# Patient Record
Sex: Male | Born: 1955
Health system: Southern US, Community
[De-identification: ages and names within clinical notes are randomized; demographics above are authoritative.]

## PROBLEM LIST (undated history)

## (undated) DIAGNOSIS — I10 Essential (primary) hypertension: Secondary | ICD-10-CM

## (undated) DIAGNOSIS — Z9989 Dependence on other enabling machines and devices: Secondary | ICD-10-CM

## (undated) DIAGNOSIS — E785 Hyperlipidemia, unspecified: Secondary | ICD-10-CM

## (undated) DIAGNOSIS — C189 Malignant neoplasm of colon, unspecified: Secondary | ICD-10-CM

## (undated) DIAGNOSIS — R0600 Dyspnea, unspecified: Secondary | ICD-10-CM

## (undated) DIAGNOSIS — G4733 Obstructive sleep apnea (adult) (pediatric): Secondary | ICD-10-CM

## (undated) DIAGNOSIS — I509 Heart failure, unspecified: Secondary | ICD-10-CM

## (undated) DIAGNOSIS — Z9289 Personal history of other medical treatment: Secondary | ICD-10-CM

## (undated) DIAGNOSIS — J189 Pneumonia, unspecified organism: Secondary | ICD-10-CM

## (undated) DIAGNOSIS — D649 Anemia, unspecified: Secondary | ICD-10-CM

## (undated) HISTORY — PX: HIP SURGERY: SHX245

## (undated) HISTORY — DX: Heart failure, unspecified: I50.9

## (undated) HISTORY — DX: Hyperlipidemia, unspecified: E78.5

## (undated) HISTORY — DX: Malignant neoplasm of colon, unspecified: C18.9

## (undated) HISTORY — DX: Anemia, unspecified: D64.9

## (undated) HISTORY — PX: HERNIA REPAIR: SHX51

## (undated) HISTORY — DX: Personal history of other medical treatment: Z92.89

## (undated) HISTORY — DX: Obstructive sleep apnea (adult) (pediatric): G47.33

## (undated) HISTORY — DX: Morbid (severe) obesity due to excess calories: E66.01

## (undated) HISTORY — PX: TONSILLECTOMY: SUR1361

## (undated) HISTORY — PX: CORNEAL TRANSPLANT: SHX108

## (undated) HISTORY — DX: Dependence on other enabling machines and devices: Z99.89

## (undated) HISTORY — PX: COLONOSCOPY: SHX174

## (undated) HISTORY — DX: Essential (primary) hypertension: I10

---

## 2004-02-09 ENCOUNTER — Emergency Department (HOSPITAL_COMMUNITY): Admission: EM | Admit: 2004-02-09 | Discharge: 2004-02-09 | Payer: Self-pay | Admitting: Family Medicine

## 2004-06-13 ENCOUNTER — Emergency Department (HOSPITAL_COMMUNITY): Admission: EM | Admit: 2004-06-13 | Discharge: 2004-06-13 | Payer: Self-pay | Admitting: Emergency Medicine

## 2004-08-06 ENCOUNTER — Inpatient Hospital Stay (HOSPITAL_COMMUNITY): Admission: EM | Admit: 2004-08-06 | Discharge: 2004-08-17 | Payer: Self-pay | Admitting: Emergency Medicine

## 2004-08-23 ENCOUNTER — Ambulatory Visit: Payer: Self-pay | Admitting: Internal Medicine

## 2004-09-07 ENCOUNTER — Ambulatory Visit: Payer: Self-pay | Admitting: Internal Medicine

## 2004-09-09 ENCOUNTER — Ambulatory Visit: Payer: Self-pay

## 2004-09-23 ENCOUNTER — Ambulatory Visit: Payer: Self-pay | Admitting: Internal Medicine

## 2004-10-21 ENCOUNTER — Ambulatory Visit: Payer: Self-pay | Admitting: Internal Medicine

## 2004-10-25 ENCOUNTER — Ambulatory Visit: Payer: Self-pay | Admitting: Internal Medicine

## 2004-11-23 ENCOUNTER — Ambulatory Visit: Payer: Self-pay | Admitting: Internal Medicine

## 2004-11-25 ENCOUNTER — Ambulatory Visit: Payer: Self-pay | Admitting: Internal Medicine

## 2004-12-08 ENCOUNTER — Ambulatory Visit: Payer: Self-pay

## 2004-12-22 ENCOUNTER — Ambulatory Visit: Payer: Self-pay

## 2005-01-18 ENCOUNTER — Ambulatory Visit: Payer: Self-pay | Admitting: Internal Medicine

## 2005-01-19 ENCOUNTER — Ambulatory Visit: Payer: Self-pay | Admitting: Internal Medicine

## 2005-01-28 ENCOUNTER — Encounter (HOSPITAL_BASED_OUTPATIENT_CLINIC_OR_DEPARTMENT_OTHER): Admission: RE | Admit: 2005-01-28 | Discharge: 2005-04-28 | Payer: Self-pay | Admitting: Surgery

## 2005-02-23 ENCOUNTER — Ambulatory Visit: Payer: Self-pay | Admitting: Internal Medicine

## 2005-03-08 ENCOUNTER — Ambulatory Visit: Payer: Self-pay | Admitting: Internal Medicine

## 2005-04-25 ENCOUNTER — Ambulatory Visit: Payer: Self-pay | Admitting: Internal Medicine

## 2005-04-29 ENCOUNTER — Encounter (HOSPITAL_BASED_OUTPATIENT_CLINIC_OR_DEPARTMENT_OTHER): Admission: RE | Admit: 2005-04-29 | Discharge: 2005-07-28 | Payer: Self-pay | Admitting: Surgery

## 2005-07-25 ENCOUNTER — Ambulatory Visit: Payer: Self-pay | Admitting: Internal Medicine

## 2005-08-19 ENCOUNTER — Ambulatory Visit: Payer: Self-pay | Admitting: Internal Medicine

## 2005-08-23 ENCOUNTER — Ambulatory Visit: Payer: Self-pay | Admitting: Internal Medicine

## 2005-08-31 ENCOUNTER — Ambulatory Visit: Payer: Self-pay | Admitting: Internal Medicine

## 2005-08-31 ENCOUNTER — Ambulatory Visit (HOSPITAL_COMMUNITY): Admission: RE | Admit: 2005-08-31 | Discharge: 2005-09-01 | Payer: Self-pay | Admitting: Internal Medicine

## 2005-08-31 ENCOUNTER — Encounter (INDEPENDENT_AMBULATORY_CARE_PROVIDER_SITE_OTHER): Payer: Self-pay | Admitting: Specialist

## 2005-09-19 DIAGNOSIS — C189 Malignant neoplasm of colon, unspecified: Secondary | ICD-10-CM

## 2005-09-19 HISTORY — DX: Malignant neoplasm of colon, unspecified: C18.9

## 2005-10-04 ENCOUNTER — Encounter (INDEPENDENT_AMBULATORY_CARE_PROVIDER_SITE_OTHER): Payer: Self-pay | Admitting: Specialist

## 2005-10-04 ENCOUNTER — Inpatient Hospital Stay (HOSPITAL_COMMUNITY): Admission: RE | Admit: 2005-10-04 | Discharge: 2005-10-11 | Payer: Self-pay

## 2005-10-20 HISTORY — PX: OTHER SURGICAL HISTORY: SHX169

## 2005-11-04 ENCOUNTER — Encounter (HOSPITAL_BASED_OUTPATIENT_CLINIC_OR_DEPARTMENT_OTHER): Admission: RE | Admit: 2005-11-04 | Discharge: 2006-02-02 | Payer: Self-pay | Admitting: Surgery

## 2005-11-16 ENCOUNTER — Ambulatory Visit: Payer: Self-pay | Admitting: Hematology and Oncology

## 2005-12-02 ENCOUNTER — Ambulatory Visit (HOSPITAL_COMMUNITY): Admission: RE | Admit: 2005-12-02 | Discharge: 2005-12-02 | Payer: Self-pay | Admitting: Hematology and Oncology

## 2005-12-05 ENCOUNTER — Ambulatory Visit: Payer: Self-pay | Admitting: Internal Medicine

## 2005-12-12 ENCOUNTER — Ambulatory Visit: Payer: Self-pay | Admitting: Internal Medicine

## 2005-12-22 LAB — COMPREHENSIVE METABOLIC PANEL
ALT: 14 U/L (ref 0–40)
AST: 22 U/L (ref 0–37)
Albumin: 3.5 g/dL (ref 3.5–5.2)
Alkaline Phosphatase: 155 U/L — ABNORMAL HIGH (ref 39–117)
BUN: 16 mg/dL (ref 6–23)
Calcium: 9.1 mg/dL (ref 8.4–10.5)
Creatinine, Ser: 0.9 mg/dL (ref 0.4–1.5)
Total Bilirubin: 0.4 mg/dL (ref 0.3–1.2)

## 2005-12-22 LAB — CBC WITH DIFFERENTIAL/PLATELET
Basophils Absolute: 0 10*3/uL (ref 0.0–0.1)
EOS%: 4.4 % (ref 0.0–7.0)
HCT: 30.6 % — ABNORMAL LOW (ref 38.7–49.9)
HGB: 9.7 g/dL — ABNORMAL LOW (ref 13.0–17.1)
MCH: 26 pg — ABNORMAL LOW (ref 28.0–33.4)
MCV: 81.5 fL — ABNORMAL LOW (ref 81.6–98.0)
MONO%: 7.9 % (ref 0.0–13.0)
NEUT%: 68.1 % (ref 40.0–75.0)

## 2005-12-27 ENCOUNTER — Ambulatory Visit: Payer: Self-pay | Admitting: Internal Medicine

## 2005-12-30 LAB — BASIC METABOLIC PANEL
CO2: 26 mEq/L (ref 19–32)
Glucose, Bld: 86 mg/dL (ref 70–99)
Potassium: 4.1 mEq/L (ref 3.5–5.3)
Sodium: 140 mEq/L (ref 135–145)

## 2005-12-30 LAB — CBC WITH DIFFERENTIAL/PLATELET
BASO%: 0.3 % (ref 0.0–2.0)
Eosinophils Absolute: 0.2 10*3/uL (ref 0.0–0.5)
MONO#: 0.5 10*3/uL (ref 0.1–0.9)
NEUT#: 4.6 10*3/uL (ref 1.5–6.5)
RBC: 3.63 10*6/uL — ABNORMAL LOW (ref 4.20–5.71)
RDW: 16.6 % — ABNORMAL HIGH (ref 11.2–14.6)
WBC: 6.3 10*3/uL (ref 4.0–10.0)

## 2005-12-30 LAB — MAGNESIUM: Magnesium: 1.6 mg/dL (ref 1.5–2.5)

## 2006-01-09 ENCOUNTER — Ambulatory Visit (HOSPITAL_COMMUNITY): Admission: RE | Admit: 2006-01-09 | Discharge: 2006-01-09 | Payer: Self-pay

## 2006-01-10 ENCOUNTER — Ambulatory Visit: Payer: Self-pay | Admitting: Hematology and Oncology

## 2006-01-13 LAB — CBC WITH DIFFERENTIAL/PLATELET
Basophils Absolute: 0 10*3/uL (ref 0.0–0.1)
EOS%: 5.6 % (ref 0.0–7.0)
Eosinophils Absolute: 0.3 10*3/uL (ref 0.0–0.5)
HCT: 33.5 % — ABNORMAL LOW (ref 38.7–49.9)
HGB: 10.3 g/dL — ABNORMAL LOW (ref 13.0–17.1)
MCH: 25.9 pg — ABNORMAL LOW (ref 28.0–33.4)
MCV: 84.4 fL (ref 81.6–98.0)
NEUT#: 2.8 10*3/uL (ref 1.5–6.5)
NEUT%: 62 % (ref 40.0–75.0)
lymph#: 0.9 10*3/uL (ref 0.9–3.3)

## 2006-01-13 LAB — COMPREHENSIVE METABOLIC PANEL
Albumin: 3.8 g/dL (ref 3.5–5.2)
Alkaline Phosphatase: 234 U/L — ABNORMAL HIGH (ref 39–117)
BUN: 15 mg/dL (ref 6–23)
CO2: 26 mEq/L (ref 19–32)
Glucose, Bld: 119 mg/dL — ABNORMAL HIGH (ref 70–99)
Total Bilirubin: 0.6 mg/dL (ref 0.3–1.2)

## 2006-01-13 LAB — MAGNESIUM: Magnesium: 1.8 mg/dL (ref 1.5–2.5)

## 2006-01-17 ENCOUNTER — Ambulatory Visit: Payer: Self-pay | Admitting: Internal Medicine

## 2006-02-02 LAB — CBC WITH DIFFERENTIAL/PLATELET
BASO%: 0.5 % (ref 0.0–2.0)
EOS%: 5.1 % (ref 0.0–7.0)
HCT: 34.6 % — ABNORMAL LOW (ref 38.7–49.9)
MCH: 26.9 pg — ABNORMAL LOW (ref 28.0–33.4)
MCHC: 32 g/dL (ref 32.0–35.9)
NEUT%: 65.4 % (ref 40.0–75.0)
RBC: 4.12 10*6/uL — ABNORMAL LOW (ref 4.20–5.71)
RDW: 20 % — ABNORMAL HIGH (ref 11.2–14.6)
lymph#: 0.7 10*3/uL — ABNORMAL LOW (ref 0.9–3.3)

## 2006-02-02 LAB — COMPREHENSIVE METABOLIC PANEL
ALT: 24 U/L (ref 0–40)
AST: 32 U/L (ref 0–37)
Calcium: 9 mg/dL (ref 8.4–10.5)
Chloride: 104 mEq/L (ref 96–112)
Creatinine, Ser: 0.9 mg/dL (ref 0.4–1.5)
Sodium: 137 mEq/L (ref 135–145)
Total Bilirubin: 0.5 mg/dL (ref 0.3–1.2)
Total Protein: 8 g/dL (ref 6.0–8.3)

## 2006-02-17 ENCOUNTER — Ambulatory Visit: Payer: Self-pay | Admitting: Internal Medicine

## 2006-02-21 ENCOUNTER — Ambulatory Visit: Payer: Self-pay

## 2006-02-21 ENCOUNTER — Encounter: Payer: Self-pay | Admitting: Internal Medicine

## 2006-02-22 ENCOUNTER — Ambulatory Visit: Payer: Self-pay | Admitting: Hematology and Oncology

## 2006-02-23 LAB — CBC WITH DIFFERENTIAL/PLATELET
Basophils Absolute: 0 10*3/uL (ref 0.0–0.1)
HCT: 34.4 % — ABNORMAL LOW (ref 38.7–49.9)
HGB: 11.2 g/dL — ABNORMAL LOW (ref 13.0–17.1)
MONO#: 0.6 10*3/uL (ref 0.1–0.9)
NEUT#: 2.3 10*3/uL (ref 1.5–6.5)
NEUT%: 54.6 % (ref 40.0–75.0)
RDW: 20.7 % — ABNORMAL HIGH (ref 11.2–14.6)
WBC: 4.1 10*3/uL (ref 4.0–10.0)
lymph#: 1 10*3/uL (ref 0.9–3.3)

## 2006-02-23 LAB — COMPREHENSIVE METABOLIC PANEL
ALT: 17 U/L (ref 0–40)
Albumin: 3.5 g/dL (ref 3.5–5.2)
BUN: 18 mg/dL (ref 6–23)
CO2: 28 mEq/L (ref 19–32)
Calcium: 8.7 mg/dL (ref 8.4–10.5)
Chloride: 105 mEq/L (ref 96–112)
Creatinine, Ser: 0.96 mg/dL (ref 0.40–1.50)
Potassium: 4.5 mEq/L (ref 3.5–5.3)

## 2006-02-23 LAB — MAGNESIUM: Magnesium: 1.4 mg/dL — ABNORMAL LOW (ref 1.5–2.5)

## 2006-03-16 LAB — COMPREHENSIVE METABOLIC PANEL
ALT: 18 U/L (ref 0–40)
Albumin: 3.8 g/dL (ref 3.5–5.2)
CO2: 27 mEq/L (ref 19–32)
Calcium: 8.8 mg/dL (ref 8.4–10.5)
Chloride: 101 mEq/L (ref 96–112)
Glucose, Bld: 199 mg/dL — ABNORMAL HIGH (ref 70–99)
Potassium: 4.4 mEq/L (ref 3.5–5.3)
Sodium: 138 mEq/L (ref 135–145)
Total Protein: 7.7 g/dL (ref 6.0–8.3)

## 2006-03-16 LAB — CBC WITH DIFFERENTIAL/PLATELET
Basophils Absolute: 0 10*3/uL (ref 0.0–0.1)
EOS%: 5.2 % (ref 0.0–7.0)
HGB: 11.5 g/dL — ABNORMAL LOW (ref 13.0–17.1)
MCH: 27.8 pg — ABNORMAL LOW (ref 28.0–33.4)
MCV: 85.5 fL (ref 81.6–98.0)
MONO%: 10.9 % (ref 0.0–13.0)
NEUT#: 3.2 10*3/uL (ref 1.5–6.5)
RBC: 4.14 10*6/uL — ABNORMAL LOW (ref 4.20–5.71)
RDW: 20 % — ABNORMAL HIGH (ref 11.2–14.6)
lymph#: 1.2 10*3/uL (ref 0.9–3.3)

## 2006-03-16 LAB — MAGNESIUM: Magnesium: 1.4 mg/dL — ABNORMAL LOW (ref 1.5–2.5)

## 2006-03-23 ENCOUNTER — Encounter (HOSPITAL_BASED_OUTPATIENT_CLINIC_OR_DEPARTMENT_OTHER): Admission: RE | Admit: 2006-03-23 | Discharge: 2006-06-01 | Payer: Self-pay | Admitting: Surgery

## 2006-04-03 ENCOUNTER — Ambulatory Visit: Payer: Self-pay | Admitting: Hematology and Oncology

## 2006-04-06 LAB — CBC WITH DIFFERENTIAL/PLATELET
BASO%: 0.5 % (ref 0.0–2.0)
Basophils Absolute: 0 10*3/uL (ref 0.0–0.1)
HCT: 32.8 % — ABNORMAL LOW (ref 38.7–49.9)
HGB: 10.5 g/dL — ABNORMAL LOW (ref 13.0–17.1)
MCHC: 32.1 g/dL (ref 32.0–35.9)
MONO#: 0.5 10*3/uL (ref 0.1–0.9)
NEUT%: 59.8 % (ref 40.0–75.0)
RDW: 19.7 % — ABNORMAL HIGH (ref 11.2–14.6)
WBC: 4.6 10*3/uL (ref 4.0–10.0)
lymph#: 1.1 10*3/uL (ref 0.9–3.3)

## 2006-04-06 LAB — COMPREHENSIVE METABOLIC PANEL
BUN: 20 mg/dL (ref 6–23)
CO2: 24 mEq/L (ref 19–32)
Calcium: 8.9 mg/dL (ref 8.4–10.5)
Chloride: 103 mEq/L (ref 96–112)
Creatinine, Ser: 0.96 mg/dL (ref 0.40–1.50)
Glucose, Bld: 175 mg/dL — ABNORMAL HIGH (ref 70–99)

## 2006-04-26 LAB — CBC WITH DIFFERENTIAL/PLATELET
BASO%: 0.5 % (ref 0.0–2.0)
Basophils Absolute: 0 10*3/uL (ref 0.0–0.1)
EOS%: 3.9 % (ref 0.0–7.0)
HCT: 36.3 % — ABNORMAL LOW (ref 38.7–49.9)
HGB: 11.9 g/dL — ABNORMAL LOW (ref 13.0–17.1)
LYMPH%: 18.7 % (ref 14.0–48.0)
MCH: 29 pg (ref 28.0–33.4)
MCHC: 32.8 g/dL (ref 32.0–35.9)
MCV: 88.4 fL (ref 81.6–98.0)
MONO%: 10 % (ref 0.0–13.0)
NEUT%: 66.9 % (ref 40.0–75.0)
Platelets: 270 10*3/uL (ref 145–400)

## 2006-04-26 LAB — COMPREHENSIVE METABOLIC PANEL
Alkaline Phosphatase: 165 U/L — ABNORMAL HIGH (ref 39–117)
BUN: 20 mg/dL (ref 6–23)
Glucose, Bld: 154 mg/dL — ABNORMAL HIGH (ref 70–99)
Sodium: 139 mEq/L (ref 135–145)
Total Bilirubin: 0.5 mg/dL (ref 0.3–1.2)
Total Protein: 7.9 g/dL (ref 6.0–8.3)

## 2006-05-28 ENCOUNTER — Ambulatory Visit: Payer: Self-pay | Admitting: Hematology and Oncology

## 2006-05-29 ENCOUNTER — Ambulatory Visit: Payer: Self-pay | Admitting: Hematology and Oncology

## 2006-05-30 ENCOUNTER — Ambulatory Visit (HOSPITAL_COMMUNITY): Admission: RE | Admit: 2006-05-30 | Discharge: 2006-05-30 | Payer: Self-pay | Admitting: Hematology and Oncology

## 2006-05-30 ENCOUNTER — Ambulatory Visit: Payer: Self-pay | Admitting: Internal Medicine

## 2006-05-30 LAB — COMPREHENSIVE METABOLIC PANEL
ALT: 18 U/L (ref 0–40)
AST: 19 U/L (ref 0–37)
Albumin: 3.5 g/dL (ref 3.5–5.2)
Alkaline Phosphatase: 153 U/L — ABNORMAL HIGH (ref 39–117)
BUN: 16 mg/dL (ref 6–23)
CO2: 26 mEq/L (ref 19–32)
Calcium: 9.3 mg/dL (ref 8.4–10.5)
Chloride: 101 mEq/L (ref 96–112)
Creatinine, Ser: 1.04 mg/dL (ref 0.40–1.50)
Glucose, Bld: 224 mg/dL — ABNORMAL HIGH (ref 70–99)
Potassium: 4.5 mEq/L (ref 3.5–5.3)
Sodium: 137 mEq/L (ref 135–145)
Total Bilirubin: 0.4 mg/dL (ref 0.3–1.2)
Total Protein: 7.5 g/dL (ref 6.0–8.3)

## 2006-05-30 LAB — CBC WITH DIFFERENTIAL/PLATELET
Basophils Absolute: 0 10*3/uL (ref 0.0–0.1)
Eosinophils Absolute: 0.2 10*3/uL (ref 0.0–0.5)
HCT: 33.6 % — ABNORMAL LOW (ref 38.7–49.9)
LYMPH%: 21.3 % (ref 14.0–48.0)
MONO#: 0.5 10*3/uL (ref 0.1–0.9)
NEUT#: 3.5 10*3/uL (ref 1.5–6.5)
NEUT%: 65.2 % (ref 40.0–75.0)
Platelets: 235 10*3/uL (ref 145–400)
WBC: 5.3 10*3/uL (ref 4.0–10.0)

## 2006-05-30 LAB — CEA: CEA: 0.6 ng/mL (ref 0.0–5.0)

## 2006-06-02 ENCOUNTER — Ambulatory Visit: Payer: Self-pay | Admitting: Internal Medicine

## 2006-06-07 ENCOUNTER — Encounter (HOSPITAL_BASED_OUTPATIENT_CLINIC_OR_DEPARTMENT_OTHER): Admission: RE | Admit: 2006-06-07 | Discharge: 2006-06-15 | Payer: Self-pay | Admitting: Surgery

## 2006-06-22 ENCOUNTER — Encounter (HOSPITAL_BASED_OUTPATIENT_CLINIC_OR_DEPARTMENT_OTHER): Admission: RE | Admit: 2006-06-22 | Discharge: 2006-09-20 | Payer: Self-pay | Admitting: Surgery

## 2006-07-14 ENCOUNTER — Ambulatory Visit: Payer: Self-pay | Admitting: Hematology and Oncology

## 2006-08-18 ENCOUNTER — Ambulatory Visit: Payer: Self-pay | Admitting: Internal Medicine

## 2006-08-31 ENCOUNTER — Ambulatory Visit: Payer: Self-pay | Admitting: Hematology and Oncology

## 2006-08-31 LAB — HM COLONOSCOPY

## 2006-09-04 LAB — CBC WITH DIFFERENTIAL/PLATELET
BASO%: 0.4 % (ref 0.0–2.0)
EOS%: 3.5 % (ref 0.0–7.0)
HCT: 32.4 % — ABNORMAL LOW (ref 38.7–49.9)
LYMPH%: 19.7 % (ref 14.0–48.0)
MCH: 28.1 pg (ref 28.0–33.4)
MCHC: 32.5 g/dL (ref 32.0–35.9)
MONO#: 0.5 10*3/uL (ref 0.1–0.9)
NEUT%: 67.4 % (ref 40.0–75.0)
RBC: 3.75 10*6/uL — ABNORMAL LOW (ref 4.20–5.71)
WBC: 5.2 10*3/uL (ref 4.0–10.0)
lymph#: 1 10*3/uL (ref 0.9–3.3)

## 2006-09-04 LAB — COMPREHENSIVE METABOLIC PANEL
ALT: 15 U/L (ref 0–53)
AST: 19 U/L (ref 0–37)
Alkaline Phosphatase: 114 U/L (ref 39–117)
BUN: 25 mg/dL — ABNORMAL HIGH (ref 6–23)
Chloride: 105 mEq/L (ref 96–112)
Creatinine, Ser: 1.13 mg/dL (ref 0.40–1.50)

## 2006-09-04 LAB — CEA: CEA: <0.5 ng/mL (ref 0.0–5.0)

## 2006-09-05 ENCOUNTER — Ambulatory Visit (HOSPITAL_COMMUNITY): Admission: RE | Admit: 2006-09-05 | Discharge: 2006-09-05 | Payer: Self-pay | Admitting: Hematology and Oncology

## 2006-09-25 ENCOUNTER — Ambulatory Visit: Payer: Self-pay | Admitting: Internal Medicine

## 2006-10-16 ENCOUNTER — Ambulatory Visit: Payer: Self-pay | Admitting: Hematology and Oncology

## 2006-12-12 ENCOUNTER — Ambulatory Visit: Payer: Self-pay | Admitting: Internal Medicine

## 2006-12-12 LAB — CONVERTED CEMR LAB
ALT: 26 units/L (ref 0–40)
AST: 26 units/L (ref 0–37)
BUN: 20 mg/dL (ref 6–23)
Direct LDL: 136.8 mg/dL
HDL: 34.3 mg/dL — ABNORMAL LOW (ref >39.0)
Hgb A1c MFr Bld: 11.8 % — ABNORMAL HIGH (ref 4.6–6.0)
LDL Cholesterol: 119 mg/dL — ABNORMAL HIGH (ref 0–99)
Triglycerides: 169 mg/dL — ABNORMAL HIGH (ref 0–149)

## 2006-12-19 ENCOUNTER — Ambulatory Visit: Payer: Self-pay | Admitting: Internal Medicine

## 2007-01-24 ENCOUNTER — Ambulatory Visit: Payer: Self-pay | Admitting: Internal Medicine

## 2007-02-05 ENCOUNTER — Ambulatory Visit: Payer: Self-pay | Admitting: Internal Medicine

## 2007-02-07 ENCOUNTER — Ambulatory Visit: Payer: Self-pay | Admitting: Internal Medicine

## 2007-02-07 LAB — CBC WITH DIFFERENTIAL/PLATELET
BASO%: 0.4 % (ref 0.0–2.0)
Eosinophils Absolute: 0.2 10*3/uL (ref 0.0–0.5)
HCT: 30.1 % — ABNORMAL LOW (ref 38.7–49.9)
LYMPH%: 13.2 % — ABNORMAL LOW (ref 14.0–48.0)
MCHC: 33.3 g/dL (ref 32.0–35.9)
MONO#: 0.4 10*3/uL (ref 0.1–0.9)
NEUT#: 5.3 10*3/uL (ref 1.5–6.5)
Platelets: 255 10*3/uL (ref 145–400)
RBC: 3.67 10*6/uL — ABNORMAL LOW (ref 4.20–5.71)
WBC: 6.9 10*3/uL (ref 4.0–10.0)
lymph#: 0.9 10*3/uL (ref 0.9–3.3)

## 2007-02-07 LAB — COMPREHENSIVE METABOLIC PANEL
ALT: 15 U/L (ref 0–53)
Albumin: 3.8 g/dL (ref 3.5–5.2)
CO2: 26 mEq/L (ref 19–32)
Calcium: 8.9 mg/dL (ref 8.4–10.5)
Chloride: 105 mEq/L (ref 96–112)
Glucose, Bld: 404 mg/dL — ABNORMAL HIGH (ref 70–99)
Sodium: 140 mEq/L (ref 135–145)
Total Bilirubin: 0.3 mg/dL (ref 0.3–1.2)
Total Protein: 7.4 g/dL (ref 6.0–8.3)

## 2007-02-07 LAB — CEA: CEA: <0.5 ng/mL (ref 0.0–5.0)

## 2007-05-28 ENCOUNTER — Ambulatory Visit: Payer: Self-pay | Admitting: Hematology and Oncology

## 2007-06-03 ENCOUNTER — Emergency Department (HOSPITAL_COMMUNITY): Admission: EM | Admit: 2007-06-03 | Discharge: 2007-06-03 | Payer: Self-pay | Admitting: Emergency Medicine

## 2007-06-06 ENCOUNTER — Encounter: Payer: Self-pay | Admitting: Internal Medicine

## 2007-06-06 ENCOUNTER — Ambulatory Visit: Payer: Self-pay | Admitting: Internal Medicine

## 2007-06-08 ENCOUNTER — Ambulatory Visit: Payer: Self-pay | Admitting: Internal Medicine

## 2007-06-20 ENCOUNTER — Encounter (HOSPITAL_BASED_OUTPATIENT_CLINIC_OR_DEPARTMENT_OTHER): Admission: RE | Admit: 2007-06-20 | Discharge: 2007-09-18 | Payer: Self-pay | Admitting: Surgery

## 2007-07-24 ENCOUNTER — Encounter: Payer: Self-pay | Admitting: Internal Medicine

## 2007-07-24 DIAGNOSIS — E785 Hyperlipidemia, unspecified: Secondary | ICD-10-CM

## 2007-07-24 DIAGNOSIS — I10 Essential (primary) hypertension: Secondary | ICD-10-CM | POA: Insufficient documentation

## 2007-07-24 DIAGNOSIS — E118 Type 2 diabetes mellitus with unspecified complications: Secondary | ICD-10-CM | POA: Insufficient documentation

## 2007-07-27 ENCOUNTER — Encounter: Payer: Self-pay | Admitting: Internal Medicine

## 2007-07-31 ENCOUNTER — Encounter: Payer: Self-pay | Admitting: Internal Medicine

## 2007-08-14 ENCOUNTER — Telehealth: Payer: Self-pay | Admitting: Internal Medicine

## 2007-08-23 ENCOUNTER — Ambulatory Visit: Payer: Self-pay | Admitting: Hematology and Oncology

## 2007-08-23 ENCOUNTER — Encounter: Payer: Self-pay | Admitting: Internal Medicine

## 2007-08-23 LAB — CBC WITH DIFFERENTIAL/PLATELET
BASO%: 0.4 % (ref 0.0–2.0)
EOS%: 4.1 % (ref 0.0–7.0)
HCT: 28.3 % — ABNORMAL LOW (ref 38.7–49.9)
MCH: 26.9 pg — ABNORMAL LOW (ref 28.0–33.4)
MCHC: 33.2 g/dL (ref 32.0–35.9)
MONO#: 0.4 10*3/uL (ref 0.1–0.9)
NEUT%: 66.6 % (ref 40.0–75.0)
RBC: 3.49 10*6/uL — ABNORMAL LOW (ref 4.20–5.71)
RDW: 16.5 % — ABNORMAL HIGH (ref 11.2–14.6)
WBC: 4.7 10*3/uL (ref 4.0–10.0)
lymph#: 1 10*3/uL (ref 0.9–3.3)

## 2007-08-23 LAB — COMPREHENSIVE METABOLIC PANEL
AST: 16 U/L (ref 0–37)
Albumin: 3.7 g/dL (ref 3.5–5.2)
Alkaline Phosphatase: 111 U/L (ref 39–117)
BUN: 17 mg/dL (ref 6–23)
Creatinine, Ser: 1.08 mg/dL (ref 0.40–1.50)
Glucose, Bld: 153 mg/dL — ABNORMAL HIGH (ref 70–99)
Potassium: 4.3 mEq/L (ref 3.5–5.3)
Total Bilirubin: 0.4 mg/dL (ref 0.3–1.2)

## 2007-08-28 ENCOUNTER — Ambulatory Visit (HOSPITAL_COMMUNITY): Admission: RE | Admit: 2007-08-28 | Discharge: 2007-08-28 | Payer: Self-pay | Admitting: Hematology and Oncology

## 2007-10-01 ENCOUNTER — Encounter: Payer: Self-pay | Admitting: Internal Medicine

## 2007-10-01 ENCOUNTER — Ambulatory Visit: Payer: Self-pay | Admitting: Internal Medicine

## 2007-10-01 DIAGNOSIS — I428 Other cardiomyopathies: Secondary | ICD-10-CM | POA: Insufficient documentation

## 2007-10-01 DIAGNOSIS — G4733 Obstructive sleep apnea (adult) (pediatric): Secondary | ICD-10-CM

## 2007-10-01 DIAGNOSIS — E669 Obesity, unspecified: Secondary | ICD-10-CM | POA: Insufficient documentation

## 2007-11-07 ENCOUNTER — Encounter: Payer: Self-pay | Admitting: Internal Medicine

## 2007-11-07 ENCOUNTER — Ambulatory Visit (HOSPITAL_COMMUNITY): Admission: RE | Admit: 2007-11-07 | Discharge: 2007-11-08 | Payer: Self-pay | Admitting: Internal Medicine

## 2007-11-14 ENCOUNTER — Ambulatory Visit: Payer: Self-pay | Admitting: Internal Medicine

## 2007-11-29 ENCOUNTER — Telehealth: Payer: Self-pay | Admitting: Internal Medicine

## 2007-12-04 ENCOUNTER — Encounter: Payer: Self-pay | Admitting: Internal Medicine

## 2007-12-10 ENCOUNTER — Telehealth: Payer: Self-pay | Admitting: Internal Medicine

## 2007-12-25 ENCOUNTER — Ambulatory Visit: Payer: Self-pay | Admitting: Hematology and Oncology

## 2007-12-27 ENCOUNTER — Telehealth: Payer: Self-pay | Admitting: Internal Medicine

## 2007-12-27 ENCOUNTER — Encounter: Payer: Self-pay | Admitting: Internal Medicine

## 2008-01-23 ENCOUNTER — Encounter: Payer: Self-pay | Admitting: Internal Medicine

## 2008-03-18 ENCOUNTER — Ambulatory Visit: Payer: Self-pay | Admitting: Internal Medicine

## 2008-03-18 DIAGNOSIS — J309 Allergic rhinitis, unspecified: Secondary | ICD-10-CM | POA: Insufficient documentation

## 2008-03-18 LAB — CONVERTED CEMR LAB
CO2: 28 meq/L (ref 19–32)
Calcium: 8.8 mg/dL (ref 8.4–10.5)
Chloride: 105 meq/L (ref 96–112)
Creatinine, Ser: 1.1 mg/dL (ref 0.4–1.5)
HDL: 31.6 mg/dL — ABNORMAL LOW (ref >39.0)
Hgb A1c MFr Bld: 8.1 % — ABNORMAL HIGH (ref 4.6–6.0)
LDL Cholesterol: 125 mg/dL — ABNORMAL HIGH (ref 0–99)
Total Bilirubin: 0.6 mg/dL (ref 0.3–1.2)
Total CHOL/HDL Ratio: 5.5

## 2008-03-23 ENCOUNTER — Encounter: Payer: Self-pay | Admitting: Internal Medicine

## 2008-03-26 ENCOUNTER — Telehealth: Payer: Self-pay | Admitting: Internal Medicine

## 2008-04-09 ENCOUNTER — Telehealth: Payer: Self-pay | Admitting: Internal Medicine

## 2008-04-11 ENCOUNTER — Encounter: Payer: Self-pay | Admitting: Internal Medicine

## 2008-04-24 ENCOUNTER — Telehealth: Payer: Self-pay | Admitting: Internal Medicine

## 2008-04-28 ENCOUNTER — Encounter: Payer: Self-pay | Admitting: Internal Medicine

## 2008-04-29 ENCOUNTER — Telehealth: Payer: Self-pay | Admitting: Internal Medicine

## 2008-05-23 ENCOUNTER — Encounter: Payer: Self-pay | Admitting: Internal Medicine

## 2008-05-29 ENCOUNTER — Ambulatory Visit: Payer: Self-pay | Admitting: Internal Medicine

## 2008-05-29 DIAGNOSIS — E1142 Type 2 diabetes mellitus with diabetic polyneuropathy: Secondary | ICD-10-CM | POA: Insufficient documentation

## 2008-06-24 ENCOUNTER — Encounter (INDEPENDENT_AMBULATORY_CARE_PROVIDER_SITE_OTHER): Payer: Self-pay | Admitting: *Deleted

## 2008-06-24 ENCOUNTER — Telehealth: Payer: Self-pay | Admitting: Internal Medicine

## 2008-06-26 ENCOUNTER — Telehealth: Payer: Self-pay | Admitting: Family Medicine

## 2008-06-27 ENCOUNTER — Telehealth: Payer: Self-pay | Admitting: Internal Medicine

## 2008-06-30 ENCOUNTER — Ambulatory Visit: Payer: Self-pay | Admitting: Internal Medicine

## 2008-07-06 ENCOUNTER — Encounter: Payer: Self-pay | Admitting: Internal Medicine

## 2008-08-18 ENCOUNTER — Ambulatory Visit: Payer: Self-pay | Admitting: Internal Medicine

## 2008-09-18 ENCOUNTER — Inpatient Hospital Stay (HOSPITAL_COMMUNITY): Admission: EM | Admit: 2008-09-18 | Discharge: 2008-09-24 | Payer: Self-pay | Admitting: Emergency Medicine

## 2008-09-26 ENCOUNTER — Telehealth: Payer: Self-pay | Admitting: Internal Medicine

## 2008-09-26 ENCOUNTER — Encounter: Payer: Self-pay | Admitting: Internal Medicine

## 2008-10-02 ENCOUNTER — Telehealth: Payer: Self-pay | Admitting: Internal Medicine

## 2008-10-10 ENCOUNTER — Encounter: Payer: Self-pay | Admitting: Internal Medicine

## 2008-10-22 ENCOUNTER — Encounter: Payer: Self-pay | Admitting: Internal Medicine

## 2008-12-26 ENCOUNTER — Telehealth: Payer: Self-pay | Admitting: Internal Medicine

## 2009-01-03 ENCOUNTER — Encounter: Payer: Self-pay | Admitting: Internal Medicine

## 2009-02-05 ENCOUNTER — Telehealth: Payer: Self-pay | Admitting: Internal Medicine

## 2009-02-20 ENCOUNTER — Ambulatory Visit: Payer: Self-pay | Admitting: Internal Medicine

## 2009-02-20 DIAGNOSIS — N529 Male erectile dysfunction, unspecified: Secondary | ICD-10-CM

## 2009-03-02 ENCOUNTER — Telehealth: Payer: Self-pay | Admitting: Internal Medicine

## 2009-04-23 ENCOUNTER — Telehealth: Payer: Self-pay | Admitting: Internal Medicine

## 2009-04-27 ENCOUNTER — Telehealth: Payer: Self-pay | Admitting: Internal Medicine

## 2009-04-29 ENCOUNTER — Ambulatory Visit: Payer: Self-pay | Admitting: Internal Medicine

## 2009-04-29 LAB — CONVERTED CEMR LAB
ALT: 18 units/L (ref 0–53)
Alkaline Phosphatase: 88 units/L (ref 39–117)
BUN: 17 mg/dL (ref 6–23)
Calcium: 8.8 mg/dL (ref 8.4–10.5)
Cholesterol: 148 mg/dL (ref 0–200)
Creatinine, Ser: 1.1 mg/dL (ref 0.4–1.5)
HDL: 34.1 mg/dL — ABNORMAL LOW (ref >39.00)
LDL Cholesterol: 94 mg/dL (ref 0–99)
Potassium: 4.4 meq/L (ref 3.5–5.1)
Sodium: 143 meq/L (ref 135–145)
Total Bilirubin: 0.4 mg/dL (ref 0.3–1.2)
Total CHOL/HDL Ratio: 4
Total Protein: 7.2 g/dL (ref 6.0–8.3)
Triglycerides: 100 mg/dL (ref 0.0–149.0)
VLDL: 20 mg/dL (ref 0.0–40.0)

## 2009-05-05 ENCOUNTER — Ambulatory Visit: Payer: Self-pay | Admitting: Internal Medicine

## 2009-05-13 ENCOUNTER — Telehealth: Payer: Self-pay | Admitting: Internal Medicine

## 2009-05-14 ENCOUNTER — Encounter (INDEPENDENT_AMBULATORY_CARE_PROVIDER_SITE_OTHER): Payer: Self-pay | Admitting: *Deleted

## 2009-05-20 DIAGNOSIS — Z9289 Personal history of other medical treatment: Secondary | ICD-10-CM

## 2009-05-20 HISTORY — DX: Personal history of other medical treatment: Z92.89

## 2009-05-27 ENCOUNTER — Ambulatory Visit: Payer: Self-pay | Admitting: Internal Medicine

## 2009-05-28 ENCOUNTER — Encounter: Payer: Self-pay | Admitting: Internal Medicine

## 2009-06-01 ENCOUNTER — Ambulatory Visit: Payer: Self-pay | Admitting: Cardiology

## 2009-06-01 DIAGNOSIS — Z91013 Allergy to seafood: Secondary | ICD-10-CM

## 2009-06-04 ENCOUNTER — Encounter: Payer: Self-pay | Admitting: Internal Medicine

## 2009-06-09 ENCOUNTER — Telehealth (INDEPENDENT_AMBULATORY_CARE_PROVIDER_SITE_OTHER): Payer: Self-pay

## 2009-06-11 ENCOUNTER — Ambulatory Visit: Payer: Self-pay

## 2009-06-11 ENCOUNTER — Encounter: Payer: Self-pay | Admitting: Cardiology

## 2009-06-12 ENCOUNTER — Telehealth: Payer: Self-pay | Admitting: Internal Medicine

## 2009-06-16 ENCOUNTER — Ambulatory Visit: Payer: Self-pay

## 2009-06-16 ENCOUNTER — Encounter: Payer: Self-pay | Admitting: Cardiology

## 2009-06-25 ENCOUNTER — Encounter (INDEPENDENT_AMBULATORY_CARE_PROVIDER_SITE_OTHER): Payer: Self-pay | Admitting: *Deleted

## 2009-06-26 ENCOUNTER — Telehealth: Payer: Self-pay | Admitting: Internal Medicine

## 2009-06-26 ENCOUNTER — Telehealth: Payer: Self-pay | Admitting: Cardiology

## 2009-07-06 ENCOUNTER — Ambulatory Visit: Payer: Self-pay | Admitting: Internal Medicine

## 2009-07-10 ENCOUNTER — Telehealth: Payer: Self-pay | Admitting: Internal Medicine

## 2009-07-27 ENCOUNTER — Telehealth (INDEPENDENT_AMBULATORY_CARE_PROVIDER_SITE_OTHER): Payer: Self-pay | Admitting: *Deleted

## 2009-08-04 ENCOUNTER — Telehealth (INDEPENDENT_AMBULATORY_CARE_PROVIDER_SITE_OTHER): Payer: Self-pay | Admitting: *Deleted

## 2009-08-17 ENCOUNTER — Encounter: Payer: Self-pay | Admitting: Internal Medicine

## 2009-09-10 ENCOUNTER — Telehealth: Payer: Self-pay | Admitting: Internal Medicine

## 2009-10-14 ENCOUNTER — Encounter: Payer: Self-pay | Admitting: Internal Medicine

## 2009-10-19 ENCOUNTER — Telehealth: Payer: Self-pay | Admitting: Internal Medicine

## 2009-11-02 ENCOUNTER — Ambulatory Visit: Payer: Self-pay | Admitting: Internal Medicine

## 2009-11-05 ENCOUNTER — Ambulatory Visit: Payer: Self-pay | Admitting: Hematology and Oncology

## 2009-11-09 LAB — CBC WITH DIFFERENTIAL/PLATELET
EOS%: 5.6 % (ref 0.0–7.0)
HGB: 10.1 g/dL — ABNORMAL LOW (ref 13.0–17.1)
MCHC: 31.2 g/dL — ABNORMAL LOW (ref 32.0–36.0)
MONO#: 0.3 10*3/uL (ref 0.1–0.9)
NEUT%: 62.7 % (ref 39.0–75.0)
Platelets: 255 10*3/uL (ref 140–400)
RBC: 3.95 10*6/uL — ABNORMAL LOW (ref 4.20–5.82)
lymph#: 1.3 10*3/uL (ref 0.9–3.3)

## 2009-11-09 LAB — COMPREHENSIVE METABOLIC PANEL
ALT: 17 U/L (ref 0–53)
CO2: 30 mEq/L (ref 19–32)
Creatinine, Ser: 1.22 mg/dL (ref 0.40–1.50)
Sodium: 139 mEq/L (ref 135–145)

## 2009-11-12 ENCOUNTER — Ambulatory Visit (HOSPITAL_COMMUNITY): Admission: RE | Admit: 2009-11-12 | Discharge: 2009-11-12 | Payer: Self-pay | Admitting: Hematology and Oncology

## 2009-11-13 ENCOUNTER — Encounter: Payer: Self-pay | Admitting: Internal Medicine

## 2009-11-15 LAB — IRON AND TIBC: UIBC: 265 ug/dL

## 2009-11-15 LAB — VITAMIN B12: Vitamin B-12: 227 pg/mL (ref 211–911)

## 2009-11-15 LAB — FERRITIN: Ferritin: 52 ng/mL (ref 22–322)

## 2009-12-18 HISTORY — PX: BARIATRIC SURGERY: SHX1103

## 2009-12-23 ENCOUNTER — Encounter: Payer: Self-pay | Admitting: Internal Medicine

## 2009-12-28 ENCOUNTER — Ambulatory Visit: Payer: Self-pay | Admitting: Internal Medicine

## 2010-01-12 ENCOUNTER — Ambulatory Visit: Payer: Self-pay | Admitting: Internal Medicine

## 2010-01-18 ENCOUNTER — Telehealth: Payer: Self-pay | Admitting: Internal Medicine

## 2010-02-03 ENCOUNTER — Telehealth: Payer: Self-pay | Admitting: Internal Medicine

## 2010-03-05 ENCOUNTER — Telehealth: Payer: Self-pay | Admitting: Internal Medicine

## 2010-04-06 ENCOUNTER — Ambulatory Visit: Payer: Self-pay | Admitting: Internal Medicine

## 2010-04-10 ENCOUNTER — Telehealth: Payer: Self-pay | Admitting: Internal Medicine

## 2010-05-13 ENCOUNTER — Ambulatory Visit: Payer: Self-pay | Admitting: Internal Medicine

## 2010-05-13 LAB — CONVERTED CEMR LAB
ALT: 20 units/L (ref 0–53)
AST: 20 units/L (ref 0–37)
Alkaline Phosphatase: 74 units/L (ref 39–117)
Bilirubin, Direct: 0.1 mg/dL (ref 0.0–0.3)
GFR calc non Af Amer: 82.4 mL/min (ref >60)
HDL: 32.7 mg/dL — ABNORMAL LOW (ref >39.00)
Hgb A1c MFr Bld: 9.4 % — ABNORMAL HIGH (ref 4.6–6.5)
Potassium: 4.8 meq/L (ref 3.5–5.1)
Sodium: 137 meq/L (ref 135–145)
Total Bilirubin: 0.5 mg/dL (ref 0.3–1.2)
Total CHOL/HDL Ratio: 5
Total Protein: 7 g/dL (ref 6.0–8.3)
Triglycerides: 128 mg/dL (ref 0.0–149.0)
VLDL: 25.6 mg/dL (ref 0.0–40.0)

## 2010-05-13 LAB — HM DIABETES FOOT EXAM

## 2010-05-24 ENCOUNTER — Encounter: Payer: Self-pay | Admitting: Internal Medicine

## 2010-06-03 ENCOUNTER — Encounter: Payer: Self-pay | Admitting: Internal Medicine

## 2010-06-08 ENCOUNTER — Telehealth: Payer: Self-pay | Admitting: Internal Medicine

## 2010-06-23 ENCOUNTER — Telehealth: Payer: Self-pay | Admitting: Internal Medicine

## 2010-07-01 ENCOUNTER — Encounter (INDEPENDENT_AMBULATORY_CARE_PROVIDER_SITE_OTHER): Payer: Self-pay | Admitting: *Deleted

## 2010-08-26 ENCOUNTER — Telehealth: Payer: Self-pay | Admitting: Internal Medicine

## 2010-10-07 ENCOUNTER — Telehealth: Payer: Self-pay | Admitting: Internal Medicine

## 2010-10-09 ENCOUNTER — Encounter: Payer: Self-pay | Admitting: *Deleted

## 2010-10-10 ENCOUNTER — Encounter: Payer: Self-pay | Admitting: Hematology and Oncology

## 2010-10-10 ENCOUNTER — Encounter: Payer: Self-pay | Admitting: *Deleted

## 2010-10-11 ENCOUNTER — Encounter: Payer: Self-pay | Admitting: Hematology and Oncology

## 2010-10-12 ENCOUNTER — Telehealth: Payer: Self-pay | Admitting: Internal Medicine

## 2010-10-17 LAB — CONVERTED CEMR LAB
CO2: 32 meq/L (ref 19–32)
Chloride: 104 meq/L (ref 96–112)
GFR calc non Af Amer: 84 mL/min
Hgb A1c MFr Bld: 8.2 % — ABNORMAL HIGH (ref 4.6–6.0)

## 2010-10-19 NOTE — Progress Notes (Signed)
Summary: Refill Req  Phone Note Refill Request Message from:  Fax from Pharmacy on April 10, 2010 10:10 AM  Refills Requested: Medication #1:  HYDROCODONE-ACETAMINOPHEN 10-650 MG TABS Take 1 tablet by mouth every 6 hrs as needed PT NEEDS office visit   Dosage confirmed as above?Dosage Confirmed   Last Refilled: 03/04/2010 Okay to refill this medication?   Method Requested: Telephone to Pharmacy Initial call taken by: Crissie Sickles, [place],  April 10, 2010 10:11 AM  Follow-up for Phone Call        Patient just seen in the office. Will OK med refill. Called to pharmacy. Follow-up by: Neena Rhymes MD,  April 10, 2010 11:19 AM    Prescriptions: HYDROCODONE-ACETAMINOPHEN 10-650 MG TABS (HYDROCODONE-ACETAMINOPHEN) Take 1 tablet by mouth every 6 hrs as needed PT NEEDS office visit  #120 x 1   Entered and Authorized by:   Neena Rhymes MD   Signed by:   Neena Rhymes MD on 04/10/2010   Method used:   Telephoned to ...       [facility] (retail)       [address]       [address]       [place], [place]  [address]       Ph: [phone]       Fax: [phone]   RxID:   [id number]

## 2010-10-19 NOTE — Letter (Signed)
Summary: [employer] / [employer] / [employer]   Imported By: Rise Patience 06/07/2010 10:55:00  _____________________________________________________________________  External Attachment:    Type:   Image     Comment:   External Document

## 2010-10-19 NOTE — Letter (Signed)
Summary: [facility]   Imported By: Phillis Knack 12/04/2009 14:32:40  _____________________________________________________________________  External Attachment:    Type:   Image     Comment:   External Document

## 2010-10-19 NOTE — Progress Notes (Signed)
  Phone Note Refill Request Message from:  Fax from Pharmacy on June 08, 2010 11:59 AM  Refills Requested: Medication #1:  HYDROCODONE-ACETAMINOPHEN 10-650 MG TABS Take 1 tablet by mouth every 6 hrs as needed PT NEEDS office visit   Last Refilled: 05/06/2010 fax from [facility]. Please Advise refill.  Initial call taken by: [place],  June 08, 2010 12:00 PM  Follow-up for Phone Call        [facility] for refill with 2 add'l. Will need OV before further Rx Follow-up by: Neena Rhymes MD,  June 08, 2010 12:54 PM    Prescriptions: HYDROCODONE-ACETAMINOPHEN 10-650 MG TABS (HYDROCODONE-ACETAMINOPHEN) Take 1 tablet by mouth every 6 hrs as needed PT NEEDS office visit  #120 x 2   Entered by:   Charlynne Cousins CMA   Authorized by:   Neena Rhymes MD   Signed by:   Charlynne Cousins CMA on 06/08/2010   Method used:   Telephoned to ...       [facility] (retail)       [address]       [address]       [place], [place]  [address]       Ph: [phone]       Fax: [phone]   RxID:   [id number]

## 2010-10-19 NOTE — Assessment & Plan Note (Signed)
Summary: POST HOSP FU--GASTRIC BYPASS  STC   Vital Signs:  Patient profile:   55 year old male Height:      72 inches Weight:      488 pounds BMI:     66.42 O2 Sat:      92 % on Room air Temp:     98.4 degrees F Pulse rate:   84 / minute BP sitting:   100 / 60  (left arm) Cuff size:   large  Vitals Entered By: Charlynne Cousins CMA (January 12, 2010 9:31 AM)  O2 Flow:  Room air CC: pt here for follow up/ ab   Primary Care Provider:  Neena Rhymes MD  CC:  pt here for follow up/ ab.  History of Present Illness: Patient presents for follow-up after Lap band procedure at [facility] Friday, April 18md. He is on ensure only. He has minimal discomfort. He feels he is doing OK.   Current Medications (verified): 1)  Bisoprolol Fumarate 10 Mg Tabs (Bisoprolol Fumarate) .... Take 1 Tablet By Mouth Once A Day 2)  Glucophage 1000 Mg Tabs (Metformin Hcl) .... Take 1 Tablet By Mouth Two Times A Day 3)  Hydrocodone-Acetaminophen 10-650 Mg Tabs (Hydrocodone-Acetaminophen) .... Take 1 Tablet By Mouth Every 6 Hrs As Needed Pt Needs Office Visit 4)  Levemir Flexpen 100 Unit/ml  Soln (Insulin Detemir) .... 30 Units Two Times A Day. 5)  Furosemide 40 Mg  Tabs (Furosemide) .... Take 1 Tablet By Mouth Two Times A Day 6)  Simvastatin 40 Mg  Tabs (Simvastatin) .Marland Kitchen.. 1 By Mouth Q Pm 7)  Miralax   Powd (Polyethylene Glycol 3350) .... Mix 1 Packet Once Daily 8)  Lovastatin 40 Mg Tabs (Lovastatin) .... Once Daily 9)  Lisinopril 20 Mg Tabs (Lisinopril) .... Take One Tablet Once Daily 10)  Oxycodone Hcl 5 Mg Tabs (Oxycodone Hcl) .Marland Kitchen.. 1 To 2 Tabs Q 4 To 6 Hours As Needed 11)  Promethazine Hcl 12.5 Mg Tabs (Promethazine Hcl) .Marland Kitchen.. 1 Q 6 Hours As Needed  Allergies (verified): 1)  ! * Shellfish 2)  * Shellfish PMH-FH-SH reviewed-no changes except otherwise noted  Review of Systems       The patient complains of weight loss.  The patient denies anorexia, fever, chest pain, and dyspnea on exertion.         28lbs  since surgery. MIld discomfort at laproscopic port sites.   Physical Exam  General:  obese AA male in W/C Head:  normocephalic and atraumatic.   Lungs:  normal respiratory effort and normal breath sounds.   Heart:  normal rate and regular rhythm.   Abdomen:  massive obese. Several "Port" wounds all look well healed without erythema or drainage. Large wound (reseveroir) just right of the umbilicus looks OK Neurologic:  alert & oriented X3 and cranial nerves II-XII intact.   Skin:  turgor normal and color normal.   Psych:  good eye contact and not anxious appearing.     Impression & Recommendations:  Problem # 1:  OBESITY, UNSPECIFIED (ICD-278.00) s/p lap band Aril 22nd - doing well. He says that he may not eat solid food until May 3 or after. He will need metabolic follow-up in 6-8 weeks.   Complete Medication List: 1)  Bisoprolol Fumarate 10 Mg Tabs (Bisoprolol fumarate) .... Take 1 tablet by mouth once a day 2)  Glucophage 1000 Mg Tabs (Metformin hcl) .... Take 1 tablet by mouth two times a day 3)  Hydrocodone-acetaminophen 10-650 Mg Tabs (Hydrocodone-acetaminophen) .Marland KitchenMarland KitchenMarland Kitchen  Take 1 tablet by mouth every 6 hrs as needed pt needs office visit 4)  Levemir Flexpen 100 Unit/ml Soln (Insulin detemir) .... 30 units two times a day. 5)  Furosemide 40 Mg Tabs (Furosemide) .... Take 1 tablet by mouth two times a day 6)  Simvastatin 40 Mg Tabs (Simvastatin) .Marland Kitchen.. 1 by mouth q pm 7)  Miralax Powd (Polyethylene glycol 3350) .... Mix 1 packet once daily 8)  Lovastatin 40 Mg Tabs (Lovastatin) .... Once daily 9)  Lisinopril 20 Mg Tabs (Lisinopril) .... Take one tablet once daily 10)  Oxycodone Hcl 5 Mg Tabs (Oxycodone hcl) .Marland Kitchen.. 1 to 2 tabs q 4 to 6 hours as needed 11)  Promethazine Hcl 12.5 Mg Tabs (Promethazine hcl) .Marland Kitchen.. 1 q 6 hours as needed

## 2010-10-19 NOTE — Letter (Signed)
Summary: CMN for CPAP Supplies/[facility]  CMN for CPAP Supplies/[facility]   Imported By: Phillis Knack 10/19/2009 07:46:58  _____________________________________________________________________  External Attachment:    Type:   Image     Comment:   External Document

## 2010-10-19 NOTE — Letter (Signed)
Summary: CMN for Repair of DME/[facility]  CMN for Repair of DME/[facility]   Imported By: Phillis Knack 12/25/2009 12:05:30  _____________________________________________________________________  External Attachment:    Type:   Image     Comment:   External Document

## 2010-10-19 NOTE — Progress Notes (Signed)
Summary: fax number to [facility]    Phone Note Call from Patient Call back at [facility] Phone [phone] Call back at [phone]   Caller: Patient Summary of Call: [place]  Fax no.   Initial call taken by: Alexander Bergeron,  June 26, 2009 10:14 AM

## 2010-10-19 NOTE — Progress Notes (Signed)
  Phone Note Refill Request Message from:  Fax from Pharmacy on June 23, 2010 1:44 PM  Refills Requested: Medication #1:  BISOPROLOL FUMARATE 10 MG TABS Take 1 tablet by mouth once a day  Medication #2:  LISINOPRIL 20 MG TABS take one tablet once daily. Initial call taken by: Ami Bullins CMA,  June 23, 2010 1:45 PM    Prescriptions: LISINOPRIL 20 MG TABS (LISINOPRIL) take one tablet once daily  #30 Tablet x 12   Entered by:   Ami Bullins CMA   Authorized by:   Neena Rhymes MD   Signed by:   Charlynne Cousins CMA on 06/23/2010   Method used:   Faxed to ...       [facility] (retail)       [address]       [place], [place]  [address]       Ph: [phone]       Fax: [phone]   RxID:   [id number] BISOPROLOL FUMARATE 10 MG TABS (BISOPROLOL FUMARATE) Take 1 tablet by mouth once a day  #30 Tablet x 12   Entered by:   Ami Bullins CMA   Authorized by:   Neena Rhymes MD   Signed by:   Charlynne Cousins CMA on 06/23/2010   Method used:   Faxed to ...       [facility] (retail)       [address]       [address]       [place], [place]  [address]       Ph: [phone]       Fax: [phone]   RxID:   [id number]

## 2010-10-19 NOTE — Progress Notes (Signed)
  Phone Note Refill Request Message from:  Fax from Pharmacy on March 05, 2010 9:07 AM  Refills Requested: Medication #1:  MIRALAX   POWD Mix 1 packet once daily Initial call taken by: [place],  March 05, 2010 9:07 AM    Prescriptions: MIRALAX   POWD (POLYETHYLENE GLYCOL 3350) Mix 1 packet once daily  #527 Each x 5   Entered by:   Ami Bullins CMA   Authorized by:   Neena Rhymes MD   Signed by:   Charlynne Cousins CMA on 03/05/2010   Method used:   Electronically to        [facility] (retail)       [id number]       [place],   [address]       Ph: [phone]       Fax: [phone]   RxID:   [id number]

## 2010-10-19 NOTE — Progress Notes (Signed)
Summary: REFERRAL   Phone Note Call from Patient Call back at Home Phone [phone]   Summary of Call: Patient is requesting referral to "foot doctor". Says at last office visit that he discussed this w/MD? Initial call taken by: Charlsie Quest, [place],  Jan 18, 2010 2:48 PM  Follow-up for Phone Call        ok for referral to podiatry - Dr. Su Hoff. [facility] notified.   Have patient come by for x-ray of right foot injured in Feb '11 - r/o fracture.  729.5 Follow-up by: Neena Rhymes MD,  Jan 18, 2010 5:25 PM  Additional Follow-up for Phone Call Additional follow up Details #1::        Pt unavail, no vm...................Marland KitchenCharlsie Quest, CMA  Jan 19, 2010 9:59 AM   left mess to call office back................Marland KitchenCharlsie Quest, CMA  Jan 19, 2010 5:39 PM   Multiple attempts, pt has been scheduled for podiatry apt per [facility] Additional Follow-up by: Charlsie Quest, CMA,  Jan 28, 2010 9:53 AM  New Problems: FOOT PAIN, RIGHT (ICD-729.5)   New Problems: FOOT PAIN, RIGHT (ICD-729.5)

## 2010-10-19 NOTE — Letter (Signed)
   [facility] [address] [place], [place]  [address] Phone: [phone]      May 25, 2010   [place],  [address]  RE:  LAB RESULTS  Dear  Mr. Housand,  The following is an interpretation of your most recent lab tests.  Please take note of any instructions provided or changes to medications that have resulted from your lab work.  ELECTROLYTES:  Good - no changes needed  KIDNEY FUNCTION TESTS:  Good - no changes needed  LIVER FUNCTION TESTS:  Good - no changes needed  LIPID PANEL:  Good - no changes needed Triglyceride: 128.0   Cholesterol: 151   LDL: 93   HDL: 32.70   Chol/HDL%:  5  THYROID STUDIES:  Thyroid studies normal TSH: 0.73     DIABETIC STUDIES:  Poor - schedule a follow-up appointment soon Blood Glucose: 239   HgbA1C: 9.4     Please call my office to make arrangements for further tests or an appointment.  Sincerely Yours,    Neena Rhymes MD

## 2010-10-19 NOTE — Progress Notes (Signed)
Summary: REQ RX  Phone Note Call from Patient   Caller: [phone] Summary of Call: 1. Patient is requesting rx for antibiotic, he is unable to go to the dentist and has an infected tooth. 2. Patient is requesting increase in pain medication for one month. Wants to go from 10/650 to 10/775? Initial call taken by: Charlsie Quest, CMA,  August 26, 2010 12:39 PM  Follow-up for Phone Call        amoxicillin 875mg  two times a day x 10 days. What he is asking for is just an increase in tylenol - not a good idea. He can take his present medication perhaps one extra time a day.  Follow-up by: Neena Rhymes MD,  August 26, 2010 1:20 PM  Additional Follow-up for Phone Call Additional follow up Details #1::        No answer...................Marland KitchenCharlsie Quest, CMA  August 26, 2010 5:46 PM     Additional Follow-up for Phone Call Additional follow up Details #2::    pt wants refill on hydrocodone, with him taking one extra as needed what do you advise the qty be on this medication Follow-up by: Ami Bullins CMA,  August 27, 2010 9:36 AM  Additional Follow-up for Phone Call Additional follow up Details #3:: Details for Additional Follow-up Action Taken: enough for 5 times a day for one month = 150, next month back to 4 times a day = 120 Missed pharmacy hours - only M-F 9-5. Left Rx for the month for filling on monday. Called in 2 day supply to [facility] Additional Follow-up by: Neena Rhymes MD,  August 27, 2010 2:32 PM  New/Updated Medications: HYDROCODONE-ACETAMINOPHEN 10-650 MG TABS (HYDROCODONE-ACETAMINOPHEN) Take 1 tablet by mouth every 6 hrs as needed PT NEEDS office visit AMOXICILLIN 875 MG TABS (AMOXICILLIN) 1 two times a day x 10 days Prescriptions: HYDROCODONE-ACETAMINOPHEN 10-650 MG TABS (HYDROCODONE-ACETAMINOPHEN) Take 1 tablet by mouth every 6 hrs as needed PT NEEDS office visit  #10 x 0   Entered and Authorized by:   Neena Rhymes MD   Signed by:    Neena Rhymes MD on 08/27/2010   Method used:   Telephoned to ...       [facility] [address] (retail)       [place], [place]  [address]       Ph: [phone]       Fax: [phone]   RxID:   [id number] HYDROCODONE-ACETAMINOPHEN 10-650 MG TABS (HYDROCODONE-ACETAMINOPHEN) Take 1 tablet by mouth every 6 hrs as needed PT NEEDS office visit  #150 x 0   Entered and Authorized by:   Neena Rhymes MD   Signed by:   Neena Rhymes MD on 08/27/2010   Method used:   Telephoned to ...       [facility] (retail)       [address]       [place], [place]  [address]       Ph: [phone]       Fax: [phone]   RxID:   [id number] AMOXICILLIN 875 MG TABS (AMOXICILLIN) 1 two times a day x 10 days  #20 x 0   Entered by:   Charlsie Quest, CMA   Authorized by:   Neena Rhymes MD   Signed by:   Charlsie Quest, CMA  on 08/26/2010   Method used:   Electronically to        [facility] (retail)       [id number]       [place], [place]  [address]       Ph: [phone]       Fax: [phone]   RxID:   [id number]

## 2010-10-19 NOTE — Assessment & Plan Note (Signed)
Summary: ankle swollen-stc / rs'd due to snow/ rs'd due to scat not co...   Vital Signs:  Patient profile:   55 year old male Height:      72 inches O2 Sat:      95 % on Room air Temp:     98.0 degrees F oral Pulse rate:   65 / minute BP sitting:   136 / 82  (left arm) Cuff size:   large  Vitals Entered By: Charlynne Cousins CMA (November 02, 2009 9:39 AM)  O2 Flow:  Room air CC: pt here for evaluation of skinned left knee and swollen ankle after falling out of his power scooter 2 weeks ago, pt states left ankle is still swollen and is painful when he puts pressure on it/ pt declined flu shot and is due for tetanus shot/ ab   Primary Care Provider:  Neena Rhymes MD  CC:  pt here for evaluation of skinned left knee and swollen ankle after falling out of his power scooter 2 weeks ago and pt states left ankle is still swollen and is painful when he puts pressure on it/ pt declined flu shot and is due for tetanus shot/ ab.  History of Present Illness: Patient is seen acutely for swollen and painful left ankle and for an abrasion below the left knee. He reports that his power w/c came to a sudden stop and he toppled out sustaining a twist to his ankle and abrasion. He reports that he has pain with weight bearing. Re: the abrasion - he has been cleaning the wound with peroxide.   He reports that his other medical problems are stable. He has bdeen seen at [facility] re: bariatric surgery. He reports that he needs to have cardiology and pulmonary letters of clearance before he can proceed any further in the [facility].   Current Medications (verified): 1)  Bisoprolol Fumarate 10 Mg Tabs (Bisoprolol Fumarate) .... Take 1 Tablet By Mouth Once A Day 2)  Glipizide 10 Mg Tabs (Glipizide) .... Take 1 Tablet By Mouth Two Times A Day 3)  Glucophage 1000 Mg Tabs (Metformin Hcl) .... Take 1 Tablet By Mouth Two Times A Day 4)  Hydrocodone-Acetaminophen 10-650 Mg Tabs (Hydrocodone-Acetaminophen) .... Take 1  Tablet By Mouth Every 6 Hrs As Needed Pt Needs Office Visit 5)  Levemir Flexpen 100 Unit/ml  Soln (Insulin Detemir) .... 60 Units Two Times A Day. 6)  Furosemide 40 Mg  Tabs (Furosemide) .... Take 1 Tablet By Mouth Two Times A Day 7)  Simvastatin 40 Mg  Tabs (Simvastatin) .Marland Kitchen.. 1 By Mouth Q Pm 8)  Miralax   Powd (Polyethylene Glycol 3350) .... Mix 1 Packet Once Daily 9)  Lovastatin 40 Mg Tabs (Lovastatin) .... Once Daily 10)  Aspirin 81 Mg Tabs (Aspirin) .... Once Daily 11)  Lisinopril 20 Mg Tabs (Lisinopril) .... Take One Tablet Once Daily  Allergies (verified): 1)  ! * Shellfish 2)  * Shellfish  Past History:  Past Medical History: Last updated: 06/01/2009 1. Diabetes mellitus.  Has history of diabetic foot ulcer and diabetic peripheral neuropathy. .  2. Hyperlipidemia 3. Hypertension 4. Colon cancer: s/p sigmoid colectomy in 2007 5. Morbid obesity 6. Anemia 7. Obstructive Sleep Apnea, CPAP 8. CHF: Presumed diastolic.  Echo (6/07) with EF 45%, severe posterior HK, mild LV hypertrophy.  No further workup of abnormal echo was done.  9. PFTs (9/10): mild obstructive defect  Past Surgical History: Last updated: 10/01/2007 Sigmoid colectomy 2/07 Deon Pilling) Corneal transplant  1980 right, 1984 left  Family History: Last updated: Jun 05, 2009 father- deceased @68 : Asbestosis mother - deceased @ 38: CHF, CAD, Hepatitis C Neg- colon or prostate cancer. Brother - died of heart failure and had MI at 43  Social History: Last updated: 06/05/09 HSG, 2 years of college Native of [place]. 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 In new relationship (04/2009)...girlfriend helps taste for cooking business and helps monitor for ulcers.   Never Smoked Alcohol use-occasional.   Drug use-no Regular exercise-no  Risk Factors: Exercise: no  (08/18/2008)  Risk Factors: Smoking Status: never (08/18/2008) Passive Smoke Exposure: no (08/18/2008)  Review of Systems       The patient complains of dyspnea on exertion, peripheral edema, and difficulty walking.  The patient denies anorexia, fever, weight loss, decreased hearing, hoarseness, chest pain, syncope, prolonged cough, abdominal pain, severe indigestion/heartburn, incontinence, muscle weakness, suspicious skin lesions, depression, abnormal bleeding, enlarged lymph nodes, and angioedema.    Physical Exam  General:  morbidly obese AA male in a w/c in no acute distress Head:  normocephalic and atraumatic.   Neck:  supple.   Lungs:  normal respiratory effort and normal breath sounds.   Heart:  normal rate and regular rhythm.   Msk:  distal lower extreemities with changes of venous stasis with no ucleration. Left ankle with non-pitting edema. He has discomfort with flexion of the left ankle, with eversion and inversion of the ankle. Neurologic:  alert & oriented X3, cranial nerves II-XII intact, and gait normal.   Skin:  chronic changes of venous stasis both distal LE. Small 2x2 cm abrasion just below the knee left LE. Good beefy red base to the wound without eschar or exudate. Psych:  Oriented X3, memory intact for recent and remote, normally interactive, and good eye contact.     Impression & Recommendations:  Problem # 1:  ANKLE SPRAIN, LEFT (ICD-845.00) Probabel 2nd degree sprain with swelling and pain with weight bearing.  Plan - may use ankle wrap           minimize weight bearing for 1-2 weeks.  His updated medication list for this problem includes:    Hydrocodone-acetaminophen 10-650 Mg Tabs (Hydrocodone-acetaminophen) .Marland Kitchen... Take 1 tablet by mouth every 6 hrs as needed pt needs office visit    Aspirin 81 Mg Tabs (Aspirin) ..... Once daily  Problem # 2:  WOUND, LEG (ICD-891.0) shallow abrasion to the left leg just below the knee. NO evidence of infection.  Plan -  wound care: wash two times a day with warm water and soap           after drying apply neosporin           cover with dry bandage.  Problem # 3:  OBESITY, UNSPECIFIED (ICD-278.00) Patient with obesity and multiple subsequent co-morbidities. He is very interested in bariatric surgery and is likely to derive significant medical benefit from such surgery. He saw Dr. Aundra Dubin for cardiology Sept 9th '10 and was felt to be a reasonable candidate for surgery. He had a stress echo done Sept 28th which was read as negative for ischemia and that he was ok for surgery (office note and 2D echo report attached). He had full PFT's September8th which revealed significant decreased diffusion capactity most likely related toa Pickwickian  type syndrome.  Complete Medication List: 1)  Bisoprolol Fumarate 10 Mg  Tabs (Bisoprolol fumarate) .... Take 1 tablet by mouth once a day 2)  Glipizide 10 Mg Tabs (Glipizide) .... Take 1 tablet by mouth two times a day 3)  Glucophage 1000 Mg Tabs (Metformin hcl) .... Take 1 tablet by mouth two times a day 4)  Hydrocodone-acetaminophen 10-650 Mg Tabs (Hydrocodone-acetaminophen) .... Take 1 tablet by mouth every 6 hrs as needed pt needs office visit 5)  Levemir Flexpen 100 Unit/ml Soln (Insulin detemir) .... 60 units two times a day. 6)  Furosemide 40 Mg Tabs (Furosemide) .... Take 1 tablet by mouth two times a day 7)  Simvastatin 40 Mg Tabs (Simvastatin) .Marland Kitchen.. 1 by mouth q pm 8)  Miralax Powd (Polyethylene glycol 3350) .... Mix 1 packet once daily 9)  Lovastatin 40 Mg Tabs (Lovastatin) .... Once daily 10)  Aspirin 81 Mg Tabs (Aspirin) .... Once daily 11)  Lisinopril 20 Mg Tabs (Lisinopril) .... Take one tablet once daily   Not Administered:    Influenza Vaccine not given due to: declined

## 2010-10-19 NOTE — Assessment & Plan Note (Signed)
Summary: PAPERWORK/NWS #   Vital Signs:  Patient profile:   55 year old male Height:      72 inches Weight:      450 pounds BMI:     61.25 O2 Sat:      95 % on Room air Temp:     98.0 degrees F oral Pulse rate:   60 / minute BP sitting:   138 / 90  (left arm) Cuff size:   large  Vitals Entered By: Charlynne Cousins CMA (May 13, 2010 11:10 AM)  O2 Flow:  Room air CC: OV to fill out paper work for power wheel chair/ ab   Primary Care Provider:  Neena Rhymes MD  CC:  OV to fill out paper work for power wheel chair/ ab.  History of Present Illness: Patient presents for evaluation regarding mobility: he has been wheel-chair bound secondary to mobility limitations related to his diabetic neuropathy, morbid obesity, ischemic cardiomyopathy with history of CHF. He has had foot ulcers secondary to chronic venous stasis. He is at high risk for decubitus ulcers secondary to his weight and immobility and has had ulcers of the buttock in the past.   He continues to have slow weight loss following Bariatric surgery at [facility].   Patient continues on his diabetic regimen. Home CBG monitoring reveals variable glucose levels in the 200  range. His last A1C in our office was 10.1% August '10.  He does report that he has been having some chest tightness/pain rated as a 3-4/10 with several minute duration. This is exertional in nature. He will also get left hand pain/numbness. These symptoms will resolve with rest. He was last seen by Dr. Aundra Dubin Sept '10 for preop eval and had stress echo. He had an EF 60%.  Lipid - not checked for a year.   C/o Headache - located right temporal region and center of forehead. Feels like a vise squeezing his head.      Current Medications (verified): 1)  Bisoprolol Fumarate 10 Mg Tabs (Bisoprolol Fumarate) .... Take 1 Tablet By Mouth Once A Day 2)  Glucophage 1000 Mg Tabs (Metformin Hcl) .... Take 1 Tablet By Mouth Two Times A Day 3)  Hydrocodone-Acetaminophen  10-650 Mg Tabs (Hydrocodone-Acetaminophen) .... Take 1 Tablet By Mouth Every 6 Hrs As Needed Pt Needs Office Visit 4)  Levemir Flexpen 100 Unit/ml  Soln (Insulin Detemir) .... 30 Units Two Times A Day. 5)  Furosemide 40 Mg  Tabs (Furosemide) .... Take 1 Tablet By Mouth Two Times A Day 6)  Simvastatin 40 Mg  Tabs (Simvastatin) .Marland Kitchen.. 1 By Mouth Q Pm 7)  Miralax   Powd (Polyethylene Glycol 3350) .... Mix 1 Packet Once Daily 8)  Lovastatin 40 Mg Tabs (Lovastatin) .... Once Daily 9)  Lisinopril 20 Mg Tabs (Lisinopril) .... Take One Tablet Once Daily  Allergies (verified): 1)  ! * Shellfish 2)  * Shellfish  Past History:  Past Surgical History: Sigmoid colectomy 2/07 Deon Pilling) Corneal transplant 1980 right, 1984 left Bariatric surgery - lap band (ad [facility]) April '11  Family History: Reviewed history from 06/01/2009 and no changes required. father- deceased @68 : Asbestosis mother - deceased @ 18: CHF, CAD, Hepatitis C Neg- colon or prostate cancer. Brother - died of heart failure and had MI at 2  Social History: Reviewed history from 06/01/2009 and no changes required. HSG, 2 years of college [place] of [place]. 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 In new relationship (04/2009)...girlfriend helps taste for cooking business and helps monitor for ulcers.   Never Smoked Alcohol use-occasional.   Drug use-no Regular exercise-no  Review of Systems       The patient complains of weight loss, chest pain, dyspnea on exertion, peripheral edema, and headaches.  The patient denies anorexia, fever, weight gain, decreased hearing, syncope, prolonged cough, abdominal pain, hematochezia, severe indigestion/heartburn, incontinence, suspicious skin lesions, difficulty walking, depression, and angioedema.         Develops a lot of phlegm in his throat on a regular  basis that interferes with his singing.  Physical Exam  General:  morbidly obese AA male in no distress Head:  normocephalic and atraumatic.  No palpable temporal artery or defect. Eyes:  vision grossly intact, pupils equal, and pupils round. C&S clear   Mouth:  good dentition.   Neck:  supple and no thyromegaly.   Chest Wall:  no deformities.   Lungs:  Normal respiratory effort, chest expands symmetrically. Lungs are clear to auscultation, no crackles or wheezes. Heart:  normal rate, regular rhythm, no murmur, and no gallop.   Abdomen:  morbidly obese Msk:  normal ROM, no joint tenderness, no joint swelling, no redness over joints, and no joint instability.   Pulses:  2+ radial Extremities:  trace distal LE edema  Diabetes Management Exam:    Foot Exam (with socks and/or shoes not present):       Sensory-Pinprick/Light touch:          Left medial foot (L-4): diminished          Left dorsal foot (L-5): diminished          Left lateral foot (S-1): diminished          Right medial foot (L-4): diminished          Right dorsal foot (L-5): diminished          Right lateral foot (S-1): diminished       Sensory-other: decreased deep vibratory sensation    Eye Exam:       Eye Exam done elsewhere          Date: 11/25/2009          Results: normal          Done by: Eldora  [address]   Impression & Recommendations:  Problem # 1:  Mobility assessment Patient with multiple co-morbidities which severely limit his mobility. Due to these problems he cannot ambulate sufficienctly to meet his ADLs. He is able to dress and toilet. His girth and cardiac disease make use of a cane or walker impractical and unworkable. His weight and cardiac disease make it impossible for him to propel himself in a manual wheelchair. Use of a power whellchair allows him to remain independent in his ADLS and facilitates his being able to live independently. He will be wheelchair dependent for the foreseeable  future.  Problem # 2:  DIABETIC PERIPHERAL NEUROPATHY (ICD-250.60) Persistent condition with no evidence at this time of any related injury to his lower extremities.  His updated medication list for this problem includes:    Glucophage 1000 Mg Tabs (Metformin hcl) .Marland Kitchen... Take 1 tablet by mouth two times a day    Levemir Flexpen 100 Unit/ml Soln (Insulin detemir) .Marland KitchenMarland KitchenMarland KitchenMarland Kitchen 30 units two times a day.    Lisinopril 20 Mg Tabs (Lisinopril) .Marland Kitchen... Take one tablet once  daily  Problem # 3:  OBESITY, UNSPECIFIED (ICD-278.00) He is 4 months s/p lap band proceudre. He has lost about 30 lbs but the rate of weightloss has diminished to 1 lb per week. He remains morbidly obese but hoepfully he will continue gradual weight loss and gain better control of his diabetes.   Problem # 4:  HYPERTENSION (ICD-401.9)  His updated medication list for this problem includes:    Bisoprolol Fumarate 10 Mg Tabs (Bisoprolol fumarate) .Marland Kitchen... Take 1 tablet by mouth once a day    Furosemide 40 Mg Tabs (Furosemide) .Marland Kitchen... Take 1 tablet by mouth two times a day    Lisinopril 20 Mg Tabs (Lisinopril) .Marland Kitchen... Take one tablet once daily  Orders: TLB-BMP (Basic Metabolic Panel-BMET) (99991111)  BP today: 138/90 Prior BP: 130/76 (04/06/2010)  Adequate control at this time. No change in medications   Problem # 5:  HYPERLIPIDEMIA (ICD-272.4)  The following medications were removed from the medication list:    Lovastatin 40 Mg Tabs (Lovastatin) ..... Once daily His updated medication list for this problem includes:    Simvastatin 40 Mg Tabs (Simvastatin) .Marland Kitchen... 1 by mouth q pm  Orders: TLB-Lipid Panel (80061-LIPID) TLB-Hepatic/Liver Function Pnl (80076-HEPATIC) TLB-TSH (Thyroid Stimulating Hormone) (84443-TSH)  Lab this date reveals an LDL 93 - fair control with a goal of less than 80. Will not change meds but encourage better diet control and weight loss ( see above)  Problem # 6:  CARDIOMYOPATHY (ICD-425.4) Historry of  cardiomyopathy although last echo revealed improved EF. He most likely has CAD. He is now reporting symptoms suggestive of stable angina.  Plan - he is referred for follow up to cardiology with an appointment second week in September - he is aware of appointment           For progressive symptoms he is advised to present to [facility] ED  Complete Medication List: 1)  Bisoprolol Fumarate 10 Mg Tabs (Bisoprolol fumarate) .... Take 1 tablet by mouth once a day 2)  Glucophage 1000 Mg Tabs (Metformin hcl) .... Take 1 tablet by mouth two times a day 3)  Hydrocodone-acetaminophen 10-650 Mg Tabs (Hydrocodone-acetaminophen) .... Take 1 tablet by mouth every 6 hrs as needed pt needs office visit 4)  Levemir Flexpen 100 Unit/ml Soln (Insulin detemir) .... 30 units two times a day. 5)  Furosemide 40 Mg Tabs (Furosemide) .... Take 1 tablet by mouth two times a day 6)  Simvastatin 40 Mg Tabs (Simvastatin) .Marland Kitchen.. 1 by mouth q pm 7)  Miralax Powd (Polyethylene glycol 3350) .... Mix 1 packet once daily 8)  Lisinopril 20 Mg Tabs (Lisinopril) .... Take one tablet once daily  Other Orders: TLB-A1C / Hgb A1C (Glycohemoglobin) (83036-A1C)  Patient: [name] Note: All result statuses are Final unless otherwise noted.  Tests: (1) Hemoglobin A1C (A1C)   Hemoglobin A1C       [H]  9.4 %                       4.6-6.5     Glycemic Control Guidelines for People with Diabetes:     Non Diabetic:  <6%     Goal of Therapy: <7%     Additional Action Suggested:  >8%   Tests: (2) BMP (METABOL)   Sodium                    137 mEq/L  135-145   Potassium                 4.8 mEq/L                   3.5-5.1   Chloride                  100 mEq/L                   96-112   Carbon Dioxide            28 mEq/L                    19-32   Glucose              [H]  239 mg/dL                   70-99   BUN                       19 mg/dL                    6-23   Creatinine                1.2 mg/dL                    0.4-1.5   Calcium                   9.1 mg/dL                   8.4-10.5   GFR                       82.40 mL/min                >60  Tests: (3) Lipid Panel (LIPID)   Cholesterol               151 mg/dL                   0-200     ATP III Classification            Desirable:  < 200 mg/dL                    Borderline High:  200 - 239 mg/dL               High:  > = 240 mg/dL   Triglycerides             128.0 mg/dL                 0.0-149.0     Normal:  <150 mg/dL     Borderline High:  150 - 199 mg/dL   HDL                  [L]  32.70 mg/dL                 >39.00   VLDL Cholesterol          25.6 mg/dL                  0.0-40.0   LDL Cholesterol           93 mg/dL  0-99  CHO/HDL Ratio:  CHD Risk                             5                    Men          Women     1/2 Average Risk     3.4          3.3     Average Risk          5.0          4.4     2X Average Risk          9.6          7.1     3X Average Risk          15.0          11.0                           Tests: (4) Hepatic/Liver Function Panel (HEPATIC)   Total Bilirubin           0.5 mg/dL                   0.3-1.2   Direct Bilirubin          0.1 mg/dL                   0.0-0.3   Alkaline Phosphatase      74 U/L                      39-117   AST                       20 U/L                      0-37   ALT                       20 U/L                      0-53   Total Protein             7.0 g/dL                    6.0-8.3   Albumin                   3.6 g/dL                    3.5-5.2  Tests: (5) TSH (TSH)   FastTSH                   0.73 uIU/mL                 0.35-5.50

## 2010-10-19 NOTE — Letter (Signed)
Summary: CMN for [facility]  CMN for [facility]   Imported By: Phillis Knack 01/13/2010 13:32:51  _____________________________________________________________________  External Attachment:    Type:   Image     Comment:   External Document

## 2010-10-19 NOTE — Progress Notes (Signed)
Summary: hydrocodone  Phone Note Refill Request Message from:  Fax from Pharmacy on Feb 03, 2010 11:20 AM  Refills Requested: Medication #1:  HYDROCODONE-ACETAMINOPHEN 10-650 MG TABS Take 1 tablet by mouth every 6 hrs as needed # 120   Last Refilled: 01/04/2010 [facility] [phone] Last ov 01/12/10   Next Appointment Scheduled: none Initial call taken by: Tomma Lightning,  Feb 03, 2010 11:22 AM  Follow-up for Phone Call        Is this ok to refill? Follow-up by: Tomma Lightning,  Feb 03, 2010 11:22 AM  Additional Follow-up for Phone Call Additional follow up Details #1::        OK to refill with 1 additilonal refill Additional Follow-up by: Neena Rhymes MD,  Feb 03, 2010 12:58 PM    Additional Follow-up for Phone Call Additional follow up Details #2::    Notified pharm spoke with Mike/pjharmacist ok # 120 with 1 addtional refill Follow-up by: Tomma Lightning,  Feb 03, 2010 2:53 PM  Prescriptions: HYDROCODONE-ACETAMINOPHEN 10-650 MG TABS (HYDROCODONE-ACETAMINOPHEN) Take 1 tablet by mouth every 6 hrs as needed PT NEEDS office visit  #120 x 1   Entered by:   Tomma Lightning   Authorized by:   Neena Rhymes MD   Signed by:   Tomma Lightning on 02/03/2010   Method used:   Telephoned to ...       [facility] (retail)       [address]       [address]       [place], [place]  [address]       Ph: [phone]       Fax: [phone]   RxID:   [id number]

## 2010-10-19 NOTE — Letter (Signed)
Summary: Appointment - Missed  [facility], [place]  [address] [place]   [place], [place] [address]   Phone: [phone]  Fax: [phone]        July 01, 2010 MRN: [id number]      [place], [place]  [address]    Dear [name],  Our records indicate you missed your appointment on October 06.2011 at 9:00am with Dr. Aundra Dubin. It is very important that we reach you to reschedule this appointment. We look forward to participating in your health care needs. Please contact us at the number listed above at your earliest convenience to reschedule this appointment.     Sincerely,   Public relations account executive

## 2010-10-19 NOTE — Progress Notes (Signed)
  Phone Note Refill Request Message from:  Fax from Pharmacy on October 19, 2009 4:05 PM  Refills Requested: Medication #1:  HYDROCODONE-ACETAMINOPHEN 10-650 MG TABS Take 1 tablet by mouth every 6 hrs as needed PT NEEDS office visit   Last Refilled: 06/2009  Medication #2:  BISOPROLOL FUMARATE 10 MG TABS Take 1 tablet by mouth once a day last office visit 07/06/2009. Please Advise refill.  Initial call taken by: Ami Bullins CMA,  October 19, 2009 4:05 PM  Follow-up for Phone Call        ok for 1 month refill on hydrocodone so that he can make office visit. Ok for refills on bisoprolol as needed   Follow-up by: Neena Rhymes MD,  October 19, 2009 6:01 PM    Prescriptions: HYDROCODONE-ACETAMINOPHEN 10-650 MG TABS (HYDROCODONE-ACETAMINOPHEN) Take 1 tablet by mouth every 6 hrs as needed PT NEEDS office visit  #120 x 0   Entered by:   Ami Bullins CMA   Authorized by:   Neena Rhymes MD   Signed by:   Charlynne Cousins CMA on 10/20/2009   Method used:   Telephoned to ...       [facility] (retail)       [address]       [place], [place]  [address]       Ph: [phone]       Fax: [phone]   RxID:   [id number] BISOPROLOL FUMARATE 10 MG TABS (BISOPROLOL FUMARATE) Take 1 tablet by mouth once a day  #30 x 1   Entered by:   Ami Bullins CMA   Authorized by:   Neena Rhymes MD   Signed by:   Charlynne Cousins CMA on 10/20/2009   Method used:   Electronically to        [facility] (retail)       [address]       [place], [place]  [address]       Ph: [phone]       Fax: [phone]   RxID:   [id number]
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
## 2010-10-19 NOTE — Assessment & Plan Note (Signed)
Summary: discuss lapband surgery/#/cd   Vital Signs:  Patient profile:   55 year old male Height:      72 inches Weight:      527 pounds BMI:     71.73 O2 Sat:      96 % on Room air Temp:     98.2 degrees F oral Pulse rate:   72 / minute BP sitting:   142 / 82  (left arm) Cuff size:   large  Vitals Entered By: Charlynne Cousins CMA (December 28, 2009 4:52 PM)  O2 Flow:  Room air CC: pt here for office visit to discuss lapband surg/ ab   Primary Care Provider:  Neena Rhymes MD  CC:  pt here for office visit to discuss lapband surg/ ab.  History of Present Illness: Patient comes by to inform me that he will be having lap-band surgery at [facility]. He is all set and ready to go. He has had full clearance from cardiology and medicine. Wished him well.   Current Medications (verified): 1)  Bisoprolol Fumarate 10 Mg Tabs (Bisoprolol Fumarate) .... Take 1 Tablet By Mouth Once A Day 2)  Glipizide 10 Mg Tabs (Glipizide) .... Take 1 Tablet By Mouth Two Times A Day 3)  Glucophage 1000 Mg Tabs (Metformin Hcl) .... Take 1 Tablet By Mouth Two Times A Day 4)  Hydrocodone-Acetaminophen 10-650 Mg Tabs (Hydrocodone-Acetaminophen) .... Take 1 Tablet By Mouth Every 6 Hrs As Needed Pt Needs Office Visit 5)  Levemir Flexpen 100 Unit/ml  Soln (Insulin Detemir) .... 60 Units Two Times A Day. 6)  Furosemide 40 Mg  Tabs (Furosemide) .... Take 1 Tablet By Mouth Two Times A Day 7)  Simvastatin 40 Mg  Tabs (Simvastatin) .Marland Kitchen.. 1 By Mouth Q Pm 8)  Miralax   Powd (Polyethylene Glycol 3350) .... Mix 1 Packet Once Daily 9)  Lovastatin 40 Mg Tabs (Lovastatin) .... Once Daily 10)  Lisinopril 20 Mg Tabs (Lisinopril) .... Take One Tablet Once Daily  Allergies (verified): 1)  ! * Shellfish 2)  * Shellfish   Complete Medication List: 1)  Bisoprolol Fumarate 10 Mg Tabs (Bisoprolol fumarate) .... Take 1 tablet by mouth once a day 2)  Glipizide 10 Mg Tabs (Glipizide) .... Take 1 tablet by mouth two times a  day 3)  Glucophage 1000 Mg Tabs (Metformin hcl) .... Take 1 tablet by mouth two times a day 4)  Hydrocodone-acetaminophen 10-650 Mg Tabs (Hydrocodone-acetaminophen) .... Take 1 tablet by mouth every 6 hrs as needed pt needs office visit 5)  Levemir Flexpen 100 Unit/ml Soln (Insulin detemir) .... 60 units two times a day. 6)  Furosemide 40 Mg Tabs (Furosemide) .... Take 1 tablet by mouth two times a day 7)  Simvastatin 40 Mg Tabs (Simvastatin) .Marland Kitchen.. 1 by mouth q pm 8)  Miralax Powd (Polyethylene glycol 3350) .... Mix 1 packet once daily 9)  Lovastatin 40 Mg Tabs (Lovastatin) .... Once daily 10)  Lisinopril 20 Mg Tabs (Lisinopril) .... Take one tablet once daily  Other Orders: No Charge Patient Arrived (NCPA0) (NCPA0)

## 2010-10-20 ENCOUNTER — Telehealth: Payer: Self-pay | Admitting: Internal Medicine

## 2010-10-21 ENCOUNTER — Telehealth: Payer: Self-pay | Admitting: Internal Medicine

## 2010-10-21 NOTE — Progress Notes (Signed)
  Phone Note Refill Request Message from:  Fax from Pharmacy on October 07, 2010 5:46 PM  Refills Requested: Medication #1:  SIMVASTATIN 40 MG  TABS 1 by mouth Q PM Initial call taken by: Ami Bullins CMA,  October 07, 2010 5:46 PM    Prescriptions: SIMVASTATIN 40 MG  TABS (SIMVASTATIN) 1 by mouth Q PM  #30 Tablet x 6   Entered by:   Ami Bullins CMA   Authorized by:   Neena Rhymes MD   Signed by:   Charlynne Cousins CMA on 10/07/2010   Method used:   Electronically to        [facility] (retail)       [address]       [place], [place]  [address]       Ph: [phone]       Fax: [phone]   RxID:   [id number]

## 2010-10-21 NOTE — Progress Notes (Signed)
  Phone Note Refill Request Message from:  Fax from Pharmacy on October 12, 2010 10:53 AM  Refills Requested: Medication #1:  HYDROCODONE-ACETAMINOPHEN 10-650 MG TABS Take 1 tablet by mouth every 6 hrs as needed PT NEEDS office visit   Last Refilled: 08/28/2010 Fax from [facility] [phone], Please Advise refill  Initial call taken by: [place],  October 12, 2010 10:54 AM  Follow-up for Phone Call        ok for refill x 3 Follow-up by: Neena Rhymes MD,  October 12, 2010 11:29 AM    Prescriptions: HYDROCODONE-ACETAMINOPHEN 10-650 MG TABS (HYDROCODONE-ACETAMINOPHEN) Take 1 tablet by mouth every 6 hrs as needed PT NEEDS office visit  #120 x 3   Entered by:   Charlynne Cousins CMA   Authorized by:   Neena Rhymes MD   Signed by:   Charlynne Cousins CMA on 10/12/2010   Method used:   Telephoned to ...       [facility] (retail)       [address]       [place], [place]  [address]       Ph: [phone]       Fax: [phone]   RxID:   [id number]

## 2010-10-27 NOTE — Progress Notes (Signed)
Summary: Diabetic Shoes  Phone Note Call from Patient   Caller: Patient [phone] Summary of Call: Pt called leaving a message on Triage B requesting a call back regarding [facility]. States he has left several messages. Initial call taken by: Crissie Sickles, [place],  October 20, 2010 11:31 AM  Follow-up for Phone Call        Pt left vm - req rx for diabetic shoes to go to [facility]. They do not have a form will need DME rx from our office. OK to prepare and fax?  Follow-up by: Charlsie Quest, [place],  October 20, 2010 11:48 AM  Additional Follow-up for Phone Call Additional follow up Details #1::        [facility] usually sends [place] the form. If we have one it is ok to prepare and send.  Additional Follow-up by: Neena Rhymes MD,  October 21, 2010 5:04 AM    Additional Follow-up for Phone Call Additional follow up Details #2::    Pt informed, will fax today to [phone] Follow-up by: Charlsie Quest, [place],  October 21, 2010 10:36 AM

## 2010-10-27 NOTE — Progress Notes (Signed)
  Phone Note Refill Request Message from:  Fax from Pharmacy on October 21, 2010 1:18 PM  Refills Requested: Medication #1:  SIMVASTATIN 40 MG  TABS 1 by mouth Q PM  Medication #2:  MIRALAX   POWD Mix 1 packet once daily  Medication #3:  FUROSEMIDE 40 MG  TABS Take 1 tablet by mouth two times a day Initial call taken by: Ami Bullins CMA,  October 21, 2010 1:24 PM    Prescriptions: MIRALAX   POWD (POLYETHYLENE GLYCOL 3350) Mix 1 packet once daily  #527 Each x 5   Entered by:   Ami Bullins CMA   Authorized by:   Neena Rhymes MD   Signed by:   Charlynne Cousins CMA on 10/21/2010   Method used:   Electronically to        [facility] (retail)       [id number]       [place], [place]  [address]       Ph: [phone]       Fax: [phone]   RxID:   [id number] SIMVASTATIN 40 MG  TABS (SIMVASTATIN) 1 by mouth Q PM  #30 Tablet x 6   Entered by:   Charlynne Cousins CMA   Authorized by:   Neena Rhymes MD   Signed by:   Charlynne Cousins CMA on 10/21/2010   Method used:   Electronically to        [facility] (retail)       [id number]       [place], [place]  [address]       Ph: [phone]       Fax: [phone]   RxID:   [id number] FUROSEMIDE 40 MG  TABS (FUROSEMIDE) Take 1 tablet by mouth two times a day  #60 Tablet x 5   Entered by:   Charlynne Cousins CMA   Authorized by:   Neena Rhymes MD   Signed by:   Charlynne Cousins CMA on 10/21/2010   Method used:   Electronically to        [facility] (retail)       [id number]       [place], [place]  [address]       Ph: [phone]       Fax: [phone]   RxID:   [id number]

## 2010-11-06 ENCOUNTER — Encounter: Payer: Self-pay | Admitting: Internal Medicine

## 2010-11-16 NOTE — Medication Information (Signed)
Summary: Order / [facility]  [facility] / [facility]   Imported By: Rise Patience 11/10/2010 11:16:49  _____________________________________________________________________  External Attachment:    Type:   Image     Comment:   External Document

## 2010-11-27 ENCOUNTER — Encounter: Payer: Self-pay | Admitting: Internal Medicine

## 2010-12-07 NOTE — Letter (Signed)
Summary: CMN/[facility]  CMN/[facility]   Imported By: Bubba Hales 12/01/2010 08:14:59  _____________________________________________________________________  External Attachment:    Type:   Image     Comment:   External Document

## 2010-12-30 ENCOUNTER — Telehealth: Payer: Self-pay | Admitting: *Deleted

## 2010-12-30 MED ORDER — METFORMIN HCL 1000 MG PO TABS: 1000.0000 mg | ORAL_TABLET | Freq: Two times a day (BID) | ORAL | Status: DC

## 2010-12-30 NOTE — Telephone Encounter (Signed)
refill 

## 2011-01-03 LAB — GLUCOSE, CAPILLARY
Glucose-Capillary: 102 mg/dL — ABNORMAL HIGH (ref 70–99)
Glucose-Capillary: 110 mg/dL — ABNORMAL HIGH (ref 70–99)
Glucose-Capillary: 128 mg/dL — ABNORMAL HIGH (ref 70–99)
Glucose-Capillary: 252 mg/dL — ABNORMAL HIGH (ref 70–99)
Glucose-Capillary: 253 mg/dL — ABNORMAL HIGH (ref 70–99)
Glucose-Capillary: 304 mg/dL — ABNORMAL HIGH (ref 70–99)
Glucose-Capillary: 63 mg/dL — ABNORMAL LOW (ref 70–99)
Glucose-Capillary: 66 mg/dL — ABNORMAL LOW (ref 70–99)
Glucose-Capillary: 71 mg/dL (ref 70–99)
Glucose-Capillary: 77 mg/dL (ref 70–99)
Glucose-Capillary: 78 mg/dL (ref 70–99)
Glucose-Capillary: 84 mg/dL (ref 70–99)
Glucose-Capillary: 86 mg/dL (ref 70–99)
Glucose-Capillary: 88 mg/dL (ref 70–99)
Glucose-Capillary: 94 mg/dL (ref 70–99)
Glucose-Capillary: 97 mg/dL (ref 70–99)

## 2011-01-03 LAB — CBC
HCT: 28.2 % — ABNORMAL LOW (ref 39.0–52.0)
HCT: 28.2 % — ABNORMAL LOW (ref 39.0–52.0)
Hemoglobin: 9 g/dL — ABNORMAL LOW (ref 13.0–17.0)
Hemoglobin: 9.1 g/dL — ABNORMAL LOW (ref 13.0–17.0)
Hemoglobin: 9.2 g/dL — ABNORMAL LOW (ref 13.0–17.0)
MCHC: 31.8 g/dL (ref 30.0–36.0)
MCHC: 32 g/dL (ref 30.0–36.0)
MCHC: 32.7 g/dL (ref 30.0–36.0)
MCV: 84.2 fL (ref 78.0–100.0)
Platelets: 218 10*3/uL (ref 150–400)
Platelets: 232 10*3/uL (ref 150–400)
RBC: 3.36 MIL/uL — ABNORMAL LOW (ref 4.22–5.81)
RBC: 3.4 MIL/uL — ABNORMAL LOW (ref 4.22–5.81)
RDW: 16.3 % — ABNORMAL HIGH (ref 11.5–15.5)
RDW: 16.7 % — ABNORMAL HIGH (ref 11.5–15.5)
WBC: 8 10*3/uL (ref 4.0–10.5)

## 2011-01-03 LAB — BASIC METABOLIC PANEL
BUN: 13 mg/dL (ref 6–23)
CO2: 27 mEq/L (ref 19–32)
Calcium: 8.7 mg/dL (ref 8.4–10.5)
Glucose, Bld: 135 mg/dL — ABNORMAL HIGH (ref 70–99)
Potassium: 4.2 mEq/L (ref 3.5–5.1)
Sodium: 139 mEq/L (ref 135–145)

## 2011-01-03 LAB — HEMOGLOBIN A1C: Mean Plasma Glucose: 192 mg/dL

## 2011-01-05 ENCOUNTER — Ambulatory Visit: Payer: Self-pay | Admitting: Internal Medicine

## 2011-01-21 ENCOUNTER — Ambulatory Visit: Payer: Self-pay | Admitting: Internal Medicine

## 2011-01-24 ENCOUNTER — Encounter: Payer: Self-pay | Admitting: Internal Medicine

## 2011-01-25 ENCOUNTER — Ambulatory Visit (INDEPENDENT_AMBULATORY_CARE_PROVIDER_SITE_OTHER): Payer: Medicare Other | Admitting: Internal Medicine

## 2011-01-25 VITALS — BP 120/78 | HR 69 | Temp 97.5°F | Wt >= 6400 oz

## 2011-01-25 DIAGNOSIS — M79674 Pain in right toe(s): Secondary | ICD-10-CM

## 2011-01-25 DIAGNOSIS — M79609 Pain in unspecified limb: Secondary | ICD-10-CM

## 2011-01-25 DIAGNOSIS — Z85038 Personal history of other malignant neoplasm of large intestine: Secondary | ICD-10-CM

## 2011-01-25 NOTE — Patient Instructions (Signed)
For toe pain - if it recurs - take aleve two tablets AM and PM. This may be gout. If it happens again it would be good to see you at the time.  Will set up referral to [facility] for removal of porta-cath.

## 2011-01-27 NOTE — Progress Notes (Signed)
  Subjective:    Patient ID: [name], male    DOB: [birth year], 55 y.o.   MRN: [id number]  HPI [name] presents due to pain in the 5th digit right foot. He has had no injury. He says the toe will become red but not hot.   He has continued to loose weight but much mor slowly.  PMH, FamHx and SocHx reviewed for any changes and relevance.   Review of Systems 10 point review of systems is negative    Objective:   Physical Exam Obese AA male in no distress Foot- 5th digit without redness, tenderness, deformity        Assessment & Plan:  1. Foot pain - toe is unremarkable. Source of pain OA vs Gout vs strain from weight.  Plan - advised to wear loose shoes           Return if the toe becomes acutely inflamed.    2. Patient reports some tenderness at the site of his old portacath.  Plan - refer to surgery for explantation

## 2011-01-31 ENCOUNTER — Telehealth: Payer: Self-pay | Admitting: *Deleted

## 2011-01-31 MED ORDER — METFORMIN HCL 1000 MG PO TABS: 1000.0000 mg | ORAL_TABLET | Freq: Two times a day (BID) | ORAL | Status: DC

## 2011-01-31 NOTE — Telephone Encounter (Signed)
refill 

## 2011-02-01 NOTE — Assessment & Plan Note (Signed)
[facility]   NAME:  DEMARRI, WEIDEMAN NO.:  [id number]   MEDICAL RECORD NO.:  [id number]      DATE OF BIRTH:  Jun 14, 1956   PHYSICIAN:  Joneen Boers A. Nils Pyle, M.D.      VISIT DATE:                                   OFFICE VISIT   SUBJECTIVE:  Mr. Piehler returns for follow up of stasis ulcer involving  the medial aspect of the left lower extremity as well as traumatic  Wagner II ulcers of the right foot.  In the interim, the patient has  used local antiseptic soap with Neosporin and cotton socks.  He has  maintained offloading by using a wheelchair and crutches.  There has  been no interim fever or drainage.   OBJECTIVE:  VITAL SIGNS:  Blood pressure is 169/84, respirations 18,  pulse rate 75, temperature 98.5, capillary blood glucoses 209 mg  percent.  EXTREMITIES:  Inspection of the left lower extremity shows that there is  100% epithelialization of the previous stasis ulcer.  The wounds of the  right third, fourth and fifth toe are completely resolved.  There is  persistence of 1+ edema.  There is no evidence of ischemia.  The pedal  pulse remains palpable.   ASSESSMENT:  Resolved stasis ulcer as well as the diabetic foot ulcers.   PLAN:  We are discharging the patient.  We have instructed him to resume  the use of his custom orthotics.  He is to wear cotton white socks.  We  have reemphasized the measures of routine diabetic foot care to include  inspection.  We have given the patient opportunity to ask questions.  He  seems to understand and indicates that he will be compliant.  He is  discharged to return to the care of Dr. Melford Aase.      Harold A. Nils Pyle, M.D.  Electronically Signed     HAN/MEDQ  D:  08/23/2007  T:  08/23/2007  Job:  [id number]   cc:   [name], MD

## 2011-02-01 NOTE — Consult Note (Signed)
[name], ABAD NO.:  [id number]   MEDICAL RECORD NO.:  [id number]           PATIENT TYPE:   LOCATION:                                 FACILITY:   PHYSICIAN:  Orlando Penner. Sevier, M.D. DATE OF BIRTH:  1956-04-04   DATE OF CONSULTATION:  07/25/2007  DATE OF DISCHARGE:                                 CONSULTATION   WOUND CENTER PROGRESS NOTE:   HISTORY:  This 55 year old overweight black male with type 2 diabetes  has been followed for a venous stasis ulcer on the gaiter area of the  left ankle just proximal to the medial malleolus.  He is been treated  with Unna wraps and has made gradual progress.  When last seen here 2  weeks ago, his wound was 2.2 x 1.8 cm, and he reports that to his eye it  appears smaller today.  He has had no bleeding, no increased drainage,  no malodor, no pain, no swelling, no systemic symptoms.  It is noted  that the wound was infected several weeks ago, and he was given a course  of Septra DS which appears to have cleaned it up very nicely.  The  organism at time was beta strep agalactiae.   PHYSICAL EXAMINATION:  VITAL SIGNS:  Blood pressure 166/91, pulse 69,  respirations 20, temperature 98.2, blood glucose 137.   The wound on the gaiter aspect of the left ankle now measures 1.4 x 1.5  x 0.1 cm.  It is clean but slightly hypergranular.  The periwound area  looks healthy.   IMPRESSION:  Progressive improvement, stasis ulceration left medial  supramalleolar area.   Diabetes mellitus apparently adequately controlled and not significantly  contributory to this problem.   DISPOSITION:  The wound was selectively debrided by literally sharply  scraping away some of hypergranulation tissue to bring the wound back  down on plane with the advancing skin edges.  This resulted in modest  bleeding which was easily controlled simply by pressure tamponade.  The  wound was then covered with a SofSorb pad and the extremity placed again  in  an Unna wrap.   Follow-up visit will be in 9 days.           ______________________________  Orlando Penner London Pepper, M.D.     RES/MEDQ  D:  07/25/2007  T:  07/25/2007  Job:  [id number]
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
## 2011-02-01 NOTE — Assessment & Plan Note (Signed)
[facility]   NAME:  [name], [name] NO.:  [id number]   MEDICAL RECORD NO.:  [id number]      DATE OF BIRTH:  06-14-1956   PHYSICIAN:  Joneen Boers A. Nils [name], M.D. VISIT DATE:  08/09/2007                                   OFFICE VISIT   SUBJECTIVE:  [name] is a 55 year old man who we are following for a  left lower extremity stasis ulceration.  In the interim he has had an  injury to his right foot with laceration of the volar area of the foot.  He has not been seen in the emergency room, there has been no fever, the  pain is very tolerable.   OBJECTIVE:  VITAL SIGNS:  Blood pressure is 160/93, respirations 18,  pulse rate 82, temperature 98.6, capillary blood glucose is 131 mg%.  EXTREMITIES:  Inspection of the left medial ankle shows that there is  contraction with advance in epithelium from the periphery.  There is  scant drainage on the right foot, on the volar aspect of the metatarsal  phalangeal joint of the third, fourth, and the fifth digits are  transverse lacerations with maceration and hyperemia.  There is no  ascending cellulitis or abscess formation.  The patient reports that he  has soaked these areas.  There is no active bleeding.  There is no  evidence of ischemic compromise.   ASSESSMENT:  Improved stasis ulcer, left lower extremity.  Recent  lacerations of the multiple digits of the right foot.   PLAN:  We will resume use of the Unna wrap on the left lower extremity.  We will apply Iodoflex to the wounds 4, 5, and 6, a bulky dressing, and  a healing sandal.  We will reevaluate the patient in 4 days.      [name], M.D.  Electronically Signed     HAN/MEDQ  D:  08/09/2007  T:  08/10/2007  Job:  [id number]
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
## 2011-02-01 NOTE — Assessment & Plan Note (Signed)
[facility]   NAME:  TORRIS, MATSUOKA NO.:  [id number]   MEDICAL RECORD NO.:  [id number]      DATE OF BIRTH:  1955-12-18   PHYSICIAN:  Joneen Boers A. Nils Pyle, M.D. VISIT DATE:  06/20/2007                                   OFFICE VISIT   SUBJECTIVE:  Mr. Kozub is a 55 year old man referred by Dr. Linda Hedges for  evaluation of an ulceration on the medial aspect of the left lower  extremity.   IMPRESSION:  Recurrent stasis ulceration with secondary infection.   RECOMMENDATION:  We are proceeding with an Unna boot with a silver  matrix dressing and oral Septra.  We will reevaluate the patient in 1  week or sooner if there is excessive drainage, pain or malodor.   SUBJECTIVE:  Mr. Yokel is a 55 year old man who has been seen in the  clinic in the past, last discharged August 15, 2006.  He has a  significant past medical history involving exogenous obesity, diabetes  and degenerative joint disease.  He continues under the care of Dr.  Linda Hedges with no major changes in his medications since his last visit.   The current wound has been present for 3 weeks.  The patient thinks that  he was bitten by an insect.   OBJECTIVE:  Blood pressure is 146/88, respirations 22, pulse rate 65,  temperature is 97.7, capillary blood glucose 127 mg%.  Inspection of the  left lower extremity shows that there is associated 2+ edema with no  popliteal tenderness.  The dorsalis pedis pulse is readily palpable.  On  the medial aspect there is an area of ulceration with raised borders and  central ulceration.  This wound is markedly tender but there is no  evidence of abscess.  The wound was cultured.  There is no malodor.  Neurologically, the patient retains protective sensation.   ASSESSMENT:  Stasis ulceration with secondary infection.   PLAN:  We will place the patient in an [place] with a silver matrix  dressing.  We will reevaluate him on a weekly basis.  We have  counseled  the patient regarding our clinical impression that this is a stasis  wound brought on by increased venous pressure.  Our key therapeutic  objectives are to reduce the bacterial load, to prevent further  infection, to provide compression, to control the venous hypertension,  and remove all nonviable tissue.  We will undoubtedly need to perform a  deep debridement after 1 week.  We will give the wound an opportunity to  undergo an autolysis in the interim.   We have given the patient the opportunity to ask questions.  He seems to  understand the aforementioned diagnosis and objectives.  He expresses  gratitude for having been seen in the clinic and indicates that he will  be compliant.      Harold A. Nils Pyle, M.D.  Electronically Signed     HAN/MEDQ  D:  06/20/2007  T:  06/20/2007  Job:  [id number]   cc:   [name], MD

## 2011-02-01 NOTE — Assessment & Plan Note (Signed)
[facility]   NAME:  [name], [name] NO.:  [id number]   MEDICAL RECORD NO.:  [id number]      DATE OF BIRTH:  10-11-1955   PHYSICIAN:  Joneen Boers A. Nils [name], M.D. VISIT DATE:  07/11/2007                                   OFFICE VISIT   SUBJECTIVE:  [name] returns for follow-up of a left medial stasis  ulcer. In the interim, he has worn the [employer].  There has been no  excessive drainage, malodor, pain or fever.   OBJECTIVE:  Blood pressure is 150/74, respirations 18, pulse rate 62,  temperature 97.9.  Capillary blood glucose is 151 mg percent.  Inspection of the left lower extremity shows a persistence of stasis  changes with hyperpigmentation. The granulation area continues to  contract.  There is no excessive drainage.  There is no malodor. A  debridement is not needed.  Capillary refill is brisk with a palpable  dorsalis pedis pulse.   ASSESSMENT:  Continued clinical response to compression therapy.   PLAN:  We will resume the Unna boot and reevaluate the patient in 10  days.      [name], M.D.  Electronically Signed     HAN/MEDQ  D:  07/11/2007  T:  07/11/2007  Job:  [id number]

## 2011-02-01 NOTE — Assessment & Plan Note (Signed)
[facility]   NAME:  BOBBI, SCHAUS NO.:  [id number]   MEDICAL RECORD NO.:  [id number]      DATE OF BIRTH:  01-20-1956   PHYSICIAN:  Joneen Boers A. Nils Pyle, M.D. VISIT DATE:  06/28/2007                                   OFFICE VISIT   SUBJECTIVE:  Mr. Stano returns for followup of his stasis ulcer  involving the left medial ankle.  In the interim he has completed a 7-  day course of Septra.  He reports that there has been no excessive  drainage, malodor, pain or fever.  He continues to be ambulatory.   OBJECTIVE:  Blood pressure 134/66, respirations 16, pulse rate 75,  temperature 98.5.  Capillary blood glucose is 127 mg%.  Inspection of  the left medial ankle shows that there has been contraction of the  wound.  There remains some prominent granulation but there no exudate  and there is no malodor.  The foot remains warm.  The pedal pulses  remains faintly palpable.  There is no evidence of ischemia or ascending  infection.   PLAN:  We will reapply the Unna boot with an Aquacel silver dressing.  We will re-evaluate the patient in 1 week. A culture has been taken  today.      Harold A. Nils Pyle, M.D.  Electronically Signed     HAN/MEDQ  D:  06/28/2007  T:  06/28/2007  Job:  [id number]

## 2011-02-01 NOTE — Assessment & Plan Note (Signed)
[facility]   NAME:  [name], [name] NO.:  [id number]   MEDICAL RECORD NO.:  [id number]      DATE OF BIRTH:  05/07/1956   PHYSICIAN:  Joneen Boers A. Nils Pyle, M.D. VISIT DATE:  07/05/2007                                   OFFICE VISIT   SUBJECTIVE:  [name] is a 55 year old man who we are following for a  stasis ulcer involving his left medial ankle.  In the interim, he has  worn an Unna wrap.  There has been no pain, no excessive drainage or  malodor.   OBJECTIVE:  Blood pressure is 143/83, respirations 20, pulse rate 69,  temperature 98.3.  Capillary blood glucose is 127 mg%.   Inspection of the wound shows that there is a 100% granulating base with  no evidence of excessive drainage, tract or abscess formation.  The  pedal pulse remains palpable.   ASSESSMENT:  Clinical improvement of stasis ulcer responding to edema  control.   PLAN:  We will replace the Unna boot and reevaluate the patient in one  week p.r.n.      Harold A. Nils Pyle, M.D.  Electronically Signed     HAN/MEDQ  D:  07/05/2007  T:  07/05/2007  Job:  [id number]
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
## 2011-02-02 ENCOUNTER — Ambulatory Visit: Payer: Self-pay | Admitting: Cardiology

## 2011-02-02 ENCOUNTER — Ambulatory Visit (INDEPENDENT_AMBULATORY_CARE_PROVIDER_SITE_OTHER): Payer: Medicare Other | Admitting: Cardiology

## 2011-02-02 ENCOUNTER — Encounter: Payer: Self-pay | Admitting: Cardiology

## 2011-02-02 VITALS — BP 159/78 | HR 62 | Ht 72.0 in | Wt >= 6400 oz

## 2011-02-02 DIAGNOSIS — I10 Essential (primary) hypertension: Secondary | ICD-10-CM

## 2011-02-02 DIAGNOSIS — R0602 Shortness of breath: Secondary | ICD-10-CM

## 2011-02-02 DIAGNOSIS — R011 Cardiac murmur, unspecified: Secondary | ICD-10-CM

## 2011-02-02 NOTE — Assessment & Plan Note (Signed)
I suspect that the patient's dyspnea on exertion is primarily due to obesity and deconditioning.  Stress echo was normal in 9/10.  He had some atypical chest pain back in 8/11, but has had no chest pain since.  I will get an echocardiogram to make sure that EF remains normal.  Patient has a systolic murmur that we can also assess with the echo.

## 2011-02-02 NOTE — Patient Instructions (Signed)
Schedule an appointment for an echocardiogram.  Your physician wants you to follow-up in: 1 year with Dr Aundra Dubin. (May 2013). You will receive a reminder letter in the mail two months in advance. If you don't receive a letter, please call our office to schedule the follow-up appointment.
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
## 2011-02-02 NOTE — Assessment & Plan Note (Signed)
BP is running high today.  It has been normal at other doctor's appointments.  I will not change his meds now but will need to monitor.

## 2011-02-04 NOTE — H&P (Signed)
NAMEBENTLEE, [name] NO.:  [id number]   MEDICAL RECORD NO.:  [id number]          PATIENT TYPE:  OIB   LOCATION:  [id number]                         [facility]AC[facility]  [facility]   PHYSICIAN:  Gatha Mayer, M.D. LHCDATE OF BIRTH:  01-29-1956   DATE OF ADMISSION:  08/31/2005  DATE OF DISCHARGE:                                HISTORY & PHYSICAL   PROBLEM:  Colon cancer.   HISTORY:  [name] is a pleasant 55 year old African-American male with recent  onset of hematochezia.  He was referred to Dr. [name] in the office per Dr.  Linda Hedges and seen on August 23, 2005.  He had been passing some dark blood  mixed with his stool off and one over the past several weeks.  He has also  had some associated abdominal cramping, mild gas and bloating, and then just  more recently a sensation of incomplete evacuation of his bowels.  He really  has no complaint of abdominal pain.  There is no family history of colon  cancer.  He says his appetite has been off a little bit, because if he does  he has been getting some cramping.  The patient was scheduled for a  colonoscopy today, and was found to have a 5 cm sigmoid colon cancer.  Colonoscopy was completed to the cecum without other lesions found.  His  recent CBC shows a hemoglobin of 11.1.  The patient is admitted at this time  for overnight observation, as he lives alone and did not have transportation  post sedation, etc., and will also obtain a CT scan of the abdomen and  pelvis, and surgical consultation.   CURRENT MEDICATIONS:  1.  Metformin 500 mg b.i.d.  2.  Glipizide 10 b.i.d.  3.  Lisinopril 10 mg daily.  4.  Lipitor nightly.  He is uncertain of the milligrams.  5.  Aspirin 81 mg daily.  6.  Advil or Aleve p.r.n.   ALLERGIES:  No known drug allergies.   PAST HISTORY:  1.  Pertinent for morbid obesity.  2.  Sleep apnea, using a CPAP machine at home.  3.  Hypertension.  4.  Adult onset diabetes mellitus.  5.  He has had bilateral  corneal transplants.  6.  Bilateral hip repairs.   FAMILY HISTORY:  Negative for colon cancer or polyps.  Positive for diabetes  on both sides of the family.   SOCIAL HISTORY:  The patient recently located from [place].  He is divorced  and currently lives alone.  Works in Press [employer].  He is a nonsmoker,  nondrinker.   REVIEW OF SYSTEMS:  Negative, other than GI symptoms.  He is disheartened  currently, as he had scheduled an appoint to be evaluated for gastric bypass  surgery at [facility] next week.   PHYSICAL EXAMINATION:  GENERAL:  Well-developed, obese African-American male  in no acute distress.  VITAL SIGNS:  Temperature is 97.9, blood pressure 136/91, pulse in the 70s.  HEENT:  Normocephalic and atraumatic.  Extraocular movements intact.  Pupils  equal, round and reactive to light and accommodation.  CARDIOVASCULAR:  Regular  rate and rhythm with S1 and S2.  PULMONARY:  Clear to auscultation and percussion.  ABDOMEN:  Morbidly obese, soft, nontender.  There is no mass or  hepatosplenomegaly.  Bowel sounds are active.  RECTAL:  Not done at this time.  EXTREMITIES:  Trace edema in the ankles.   IMPRESSION:  1.  A 55 year old African-American male with sigmoid colon cancer with      recent hematochezia, change in bowel habits, and abdominal cramping.  2.  Mild anemia secondary to above.  3.  Adult onset diabetes mellitus following oral agents.  4.  Hypertension.  5.  Sleep apnea.  Uses CPAP.  6.  Morbid obesity.  7.  Status post bilateral hip repairs.  8.  Status post bilateral corneal transplants.   PLAN:  The patient is admitted to the service of Dr. Silvano Rusk for  overnight observation.  He will be kept on a clear liquid diet.  Will  scheduled CT scan of the abdomen and pelvis and obtain surgical  consultation.  For further details, please see the orders.      Nicoletta Ba, P.A.-C. [facility]      Gatha Mayer, M.D. [facility]  Electronically Signed    AE/MEDQ  D:   08/31/2005  T:  09/01/2005  Job:  [id number]   cc:   [name], M.D. [facility]  [address]  [place]  [place] [address]   Georgina Quint, M.D.  [id number] [address], [place]  [place]  [place] [address]

## 2011-02-04 NOTE — Assessment & Plan Note (Signed)
[facility]   NAME:  ZAK, HYKE NO.:  [id number]   MEDICAL RECORD NO.:  [id number]      DATE OF BIRTH:  1956-04-15   PHYSICIAN:  Joneen Boers A. Nils Pyle, M.D.      VISIT DATE:                                     OFFICE VISIT   SUBJECTIVE:  [name] is a 55 year old man who has been followed with a  neuropathic diabetic foot ulcer of the left great toe.  In the interim, he  has been fitted for custom inserts and extra-depth shoes.  He reports that  the shoes did arrive but were too small and had been returned for  modifications.  In the interim he has worn a Darco healing sandal for off-  loading modified with multiple felt strips at the metatarsal head  He denies  excessive drainage, malodor, pain or fever.  His blood sugars have been in  reasonable control according to his historical norms.   OBJECTIVE:  VITAL SIGNS:  Blood pressure:  120/70.  Respirations:  18.  Pulse rate: 82 and regular.  He is afebrile.  Capillary blood glucose is 122  mg percent.  Inspection of the left great toe shows an advancement of the thickened  epithelium and callus.  This area was debrided free with Rongeurs disclosing  necrosis with an underlying ulcer bed showing chronic granulation.  Areas of  necrosis were sharply excised with hemorrhage control with silver nitrate.  Pedal pulses remained readily palpable.  Neurologically, the patient remains  insensate.   ASSESSMENT:  Persistent neuropathic ulcer with questionable compliance.   PLAN:  We instructed the patient to continue the use of his Darco healing  sandal.  This sandal has been modified with felt strips and upon inspection  and fitting to the patient's foot it does not appear that there is any  friction between the ulcer and the shoe.  Nonetheless, we have re-fashioned  the off-loading portion of the sandal and we have instructed the patient to  make sure that he wears the sandal at all times to  afford adequate off-  loading.  We will re-evaluate him in 2 weeks.  He also indicates that his  custom shoe modifications should be completed and his new shoes should be  returned within 3-4 weeks.           ______________________________  Epifania Gore Nils Pyle, M.D.     [name]  D:  06/26/2006  T:  06/27/2006  Job:  [id number]   cc:   [name], MD

## 2011-02-04 NOTE — H&P (Signed)
NAMESEANANTHONY, REEL NO.:  [id number]   MEDICAL RECORD NO.:  [id number]          PATIENT TYPE:  INP   LOCATION:  [place]                         FACILITY:  [facility]   PHYSICIAN:  Melissa L. Lovena Le, MD  DATE OF BIRTH:  03-05-1956   DATE OF ADMISSION:  08/06/2004  DATE OF DISCHARGE:                                HISTORY & PHYSICAL   CHIEF COMPLAINT:  I got the flu first, then increased urination and pain.   PRIMARY CARE PHYSICIAN:  Unassigned.  He previously saw a physician by the  name of Dr. Sherrye Payor, who is out of state.   HISTORY OF PRESENT ILLNESS:  The patient is a 55 year old, African-American  male who states that 1 week ago he developed flu like symptoms consisting of  upper respiratory cough with greenish sputum, chills, fever.  He states that  these symptoms appeared in the midst of him not taking his diabetic  medication.  The patient states that he has been trying to lose weight and  so for the past 1 month he decided not to take his diabetic medication  because he knew it would cause him to urinate a lot which would decrease his  weight.  He states that he was doing okay until this week when he noted the  onset of weakness, inability to walk without being short of breath,  sensation of pressure in his chest, some right-sided chest discomfort.  He  then developed the onset of burning when he would urinate and urine came in  contact with his scrotum.  He examined himself and found that the area had a  small break in the skin.  This progressed on to overt pus.  He denied any  vesicular lesions to me for the initial source for the infection, however,  the ER note states that he had been getting sores on his penis.  He states  that his symptoms progressed on to worsening fevers, chills, headaches and  sweats.  His wife encouraged him to come into the emergency room.   PAST MEDICAL HISTORY:  1.  Non-insulin dependent diabetes mellitus.  2.  No blood pressure  problems.  3.  Left foot corn which was recently shaved.  4.  Positive numbness of his legs up to his calf.  5.  Left toe, as stated, was recently operated on and this was scheduled to      be soaked and he was to be on Keflex b.i.d.   PAST SURGICAL HISTORY:  1.  Corneal transplant in the right eye.  2.  Both hips have pins.   SOCIAL HISTORY:  He denies tobacco or ethanol use.  He is an Optometrist.  He  is married to his second wife.  He has no children.   FAMILY HISTORY:  Mom is living with diabetes.  Dad is deceased secondary to  asbestosis.   ALLERGIES:  No known drug allergies.   MEDICATIONS:  1.  Glipizide 10 mg q.d.  2.  Metformin 500 mg b.i.d.  3.  Lasix 5 mg q.d.  4.  Cephalexin 500 mg b.i.d.  REVIEW OF SYSTEMS:  CONSTITUTIONAL:  He is complaining of exertional dyspnea  with swelling of his legs.  Positive fever.  Positive chills.  GASTROINTESTINAL:  No nausea, no vomiting, no diarrhea.  CARDIOPULMONARY:  Positive PND, no orthopnea.  Some right-sided chest pain.  GENITOURINARY:  As stated, he has gross pus from his penis and burning over his scrotum.  He  has had decreased weight secondary to increased urination.  All other review  of systems are negative.  See HPI for additionally mentioned ROS.   PHYSICAL EXAMINATION:  VITAL SIGNS:  Temperature 100.5 on admission, down to  98.9, blood pressure 140/72, pulse 80, respirations 20, 99% saturations.  GENERAL:  This is a morbidly obese, African-American male in significant  distress secondary to pain.  HEENT:  Normocephalic, atraumatic.  Pupils equal round and reactive on the  left.  His right eye is cloudy secondary to his corneal transplant.  NECK:  Short and supple.  I cannot appreciate any JVD.  CHEST:  Decreased breath sounds bilaterally with no wheeze.  CARDIAC:  Tachycardic, positive S1, S2, no S3, S4.  Distant heart sounds.  ABDOMEN:  Morbidly obese with noticeable abscess in the right side of his  pannus.   GENITALIA:  He has bilateral inguinal vesicular lesions with copious, foul  smelling, green purulent material and coalescing unroofed vesicles on his  testicles, penis and glands.  His penis is retracted into the surrounding  tissues and it can be unextracted with some difficulty.  There is purulent  green material surrounding the glands.  RECTAL:  I am unable to visualize his rectum at this time.  EXTREMITIES:  Left great toe shows an area of debridement with no exudate,  but the surrounding tissue is mildly erythematous and firm.  He has  incredibly dry feet.  NEUROLOGIC:  Awake, alert and oriented.  He looks ill.  Power is 4/5 and  symmetrical.  Deep tendon reflexes are 1+.   LABORATORY DATA AND X-RAY FINDINGS:  Sodium 125, corrected sodium 128,  potassium 3.9, chloride 92, CO2 26, BUN 12, creatinine 1.2, glucose 337.  White count 7.2, hemoglobin 13.1, hematocrit 41.5 with 84% neutrophils,  platelets 198.  CSF reveals glucose of 169, total protein 25.  Gram smear  shows no organisms, no wbc's.  Cell count shows few lymphs, monos and  neutrophils.  His UA is leukocyte esterase negative and nitrate negative.   EKG shows rate of 1.3 sinus tachycardia with increased ST segments in V3-V6  which I believe are likely artifact.  Chest x-ray shows borderline cardiac  enlargement with no current CHF or acute disease.   ASSESSMENT:  This is a 55 year old, African-American male with what appears  to be likely a primary general herpes outbreak with severe super infection  and hyperglycemia.  He also expressed a change in mental status.  Lumbar  puncture at this time is negative for meningitis.  Cannot rule out any  septic viral meningitis related to herpes.  The patient complains of  recurrent cardiac complaints, shortness of breath, fatigue, paroxysmal  nocturnal dyspnea, right chest pain.   PLAN: 1.  Cardiovascular.  The patient has borderline cardiomegaly on chest x-ray      with some  previous pulmonary edema noted on old x-ray.  Will check      cardiac enzymes and repeat his EKG and start him on an aspirin.  May      consider using full dose of Lovenox if the cardiac enzymes are elevated.  2.  Pulmonary.  His chest x-ray is clear on admission.  He is currently      saturating well.  3.  Genitourinary.  We suspect a primary herpes infection with bacterial      super infection.  Will recommend a urology consult in the morning.      Start acyclovir, Zosyn and vancomycin.  Will give b.i.d. wound care and      obtain and ID consult.  4.  Gastrointestinal.  I would like to start him on Protonix while on      aspirin and needs to be helped with bathing of his genital and rectal      areas.  5.  Endocrine.  Hyperglycemia without gap.  There are trace ketones in his      urine.  Sliding scale insulin, IV hydration and continuing his      Glipizide.  I will check an ABG just to assure that this is not an      occult DKA.  6.  Infectious disease.  Will start him on Zosyn, vancomycin and acyclovir.      ID consult in the morning if appropriate.  7.  Wound care.  Cleansing of the perineum t.i.d.  Continue Vaseline gauze      which may keep some of the areas from sticking together.  8.  Deep venous thrombosis prophylaxis with Lovenox.  9.  Left foot toe wound.  Will need to check an x-ray to rule out      osteomyelitis.  10. Will provide him with a big boy bed for improved mobility.     Meli   MLT/MEDQ  D:  08/07/2004  T:  08/07/2004  Job:  [id number]

## 2011-02-04 NOTE — Assessment & Plan Note (Signed)
[facility]   NAME:  [name], [name] NO.:  [id number]   MEDICAL RECORD NO.:  [id number]      DATE OF BIRTH:  1956/03/09   PHYSICIAN:  Joneen Boers A. Nils Pyle, M.D. VISIT DATE:  06/08/2006                                     OFFICE VISIT   VITAL SIGNS:  Blood pressure: See nurses note.  Respirations 22. Pulse 82.  He is afebrile.   PURPOSE OF TODAY'S VISIT:  [name] is a 55 year old man with diabetes  and a diabetic foot ulcer on the left great toe.  In the interim, we have  treated him with off-loading with modified Darco shoe which the patient  admits he has not worn.  In the interim he has developed progressive callus,  extension of the ulceration and a serosanguineous malodorous drainage.   WOUND EXAM:  Inspection of the foot shows that there is a large halo of  callus surrounding a deep burrowing ulcer which stops short of periosteum.  This callus, as well as necrotic debris in the periphery of the ulcer, were  debrided using Rongeurs and a #10 blade.  Hemorrhage was controlled with  direct pressure.   DIAGNOSIS:  Inadequate off-loading and persistent Wagner grade 2 diabetic  foot ulcer.   MANAGEMENT PLAN & GOAL:  The wound has been cultured.  We have re-emphasized  to the patient that he needs to wear his off-loading sandal.  He has been  fitted for custom inserted shoes and they should be in his possession within  the next week.  In the interim, we have instructed the patient in the use of  antiseptic soap, off-loading with the Darco shoe, topical application of  Neosporin.  We will re-evaluate him in two weeks.           ______________________________  Epifania Gore Nils Pyle, M.D.     [name]  D:  06/08/2006  T:  06/10/2006  Job:  [id number]

## 2011-02-04 NOTE — Assessment & Plan Note (Signed)
[facility]   NAME:  [name], [name] NO.:  [id number]   MEDICAL RECORD NO.:  [id number]      DATE OF BIRTH:  March 29, 1956   PHYSICIAN:  Joneen Boers A. Nils Pyle, M.D. VISIT DATE:  08/01/2006                                     OFFICE VISIT   VITAL SIGNS:  Blood pressure is 124/67, respirations 18, pulse rate 64, and  he is afebrile.  Capillary blood glucose is 160 mg percent.   PURPOSE OF TODAY'S VISIT:  [name] is a 55 year old man with a Wagner  grade 2 ulceration on his left great toe.  We have treated him with total  contact cast for off-loading and a calcium alginate dressing over the last  week.  He returns for followup.  He denies pain, fever, or swelling.   WOUND EXAM:  Inspection of the wound shows that there has been remarkable  contraction of the wound bed.  There is a minimum amount of desquamation and  necrosis at the circumference.  The wound was full thickness, debrided, with  hemorrhage control with direct pressure.   DIAGNOSIS:  Clinical improvement with off-loading.   MANAGEMENT PLAN & GOAL:  In the interim, the patient had grown a group B  strep.  We will not treat this with antibiotics.  We will continue him in a  total contact cast for 2 weeks and reevaluate him at that time.  The patient  has on order custom shoes and inserts.  Hopefully, we will be able to remove  the cast and have him placed into his custom shoe on the same date.  We have  explained this plan to the patient in the terms that he seems to understand.  He indicates that he will comply as outlined.           ______________________________  Epifania Gore. Nils Pyle, M.D.     [name]  D:  08/01/2006  T:  08/02/2006  Job:  [id number]

## 2011-02-04 NOTE — Assessment & Plan Note (Signed)
[facility]   NAME:  [name], [name] NO.:  [id number]   MEDICAL RECORD NO.:  [id number]      DATE OF BIRTH:  [birth year]   PHYSICIAN:  Joneen Boers A. Nils Pyle, M.D.      VISIT DATE:                                     OFFICE VISIT   DATE OF VISIT:  July 26, 2006.   SUBJECTIVE:  [name] is a 55 year old with a Wagner grade 3 diabetic  foot ulcer involving the left great toe.  He has been managed with  offloading using a padded Darco shoe.  He reports that the shoe was mal  fitting and caused interim injury.  He denies drainage, malodor, or fever.   OBJECTIVE:  Blood pressure is 140/80, respirations 18, pulse rate 78, and he  is afebrile.  Capillary blood glucose is 124 mg per cent.  Inspection of the  left great toe shows a prominent amount of peri wound callous which was  debrided off, there were necrotic areas in the center of the ulcer which  were full-thickness debrided with hemorrhage control with silver nitrate.  Prior to the debridement, a culture was taken.   ASSESSMENT:  A Wagner grade 2 diabetic ulcer with inadequate offloading.   PLAN:  We have dressed the wound with a calcium alginate following the  irrigation and local care.  We will place him in a total contact cast and re  evaluate the wound in 1 week.           ______________________________  Epifania Gore. Nils Pyle, M.D.     [name]  D:  07/26/2006  T:  07/26/2006  Job:  [id number]

## 2011-02-04 NOTE — Discharge Summary (Signed)
[name], PLANO NO.:  [id number]   MEDICAL RECORD NO.:  [id number]          PATIENT TYPE:  INP   LOCATION:  [id number]                         FACILITY:  [facility]   PHYSICIAN:  Georgina Quint, M.D.   DATE OF BIRTH:  1956-08-25   DATE OF ADMISSION:  10/04/2005  DATE OF DISCHARGE:  10/11/2005                                 DISCHARGE SUMMARY   HISTORY:  This is a 55 year old black male who was found on colonoscopy to  have a carcinoma of the sigmoid colon. CT scan showed no evidence of  metastatic disease. The patient suffers from extreme obesity and has high  blood pressure and type 2 diabetes. He agreed to have a colectomy and was  admitted to the hospital for that purpose on the day of surgery. He took his  bowel prep at home prior to coming into the hospital. On physical  examination, there was no adenopathy, no hepatomegaly or other evidence of  metastatic disease. The patient was very obese.   HOSPITAL COURSE:  On the day of admission, I did a sigmoid colectomy with  anastomosis. The tumor was somewhat lower than expected and required  anastomosis at about the peritoneal reflection. Postoperatively, he  initially got along fine, had fairly prompt return of GI function. He had  some fever however and on October 10, 2005, I found that he had obvious  purulent wound infection. I opened his wound and packed it and ordered  dressing changes. He immediately became afebrile. Pathology came back  showing a T3 tumor with spread to one lymph node. It is anticipated that he  will have referral to oncology after he is home from the hospital. I am  arranging for him to have dressing changes by visiting nurses and have  requested vacuum-assisted closure approval from his insurance. I will be  following him up in the office for check on his wound. At the time of  discharge, he is eating well, bowels are moving, he is feeling pretty well.   DISCHARGE DIAGNOSES:  1.  Carcinoma  of the distal sigmoid colon with metastasis to 1/18 lymph      nodes.  2.  Obesity.  3.  Hypertension.  4.  Type 2 diabetes.  5.  Wound infection.   OPERATION:  Sigmoid colectomy with anastomosis.   DISCHARGE CONDITION:  Stable and improving.      Georgina Quint, M.D.  Electronically Signed     WB/MEDQ  D:  10/15/2005  T:  10/17/2005  Job:  [id number]

## 2011-02-04 NOTE — Assessment & Plan Note (Signed)
[facility]   NAME:  [name], [name] NO.:  [id number]   MEDICAL RECORD NO.:  [id number]      DATE OF BIRTH:  1956/03/05   PHYSICIAN:  Ricard Dillon, M.D.      VISIT DATE:                                     OFFICE VISIT   PURPOSE OF TODAY'S VISIT:  [name] returns today in followup from his  neuropathic ulcer on his left great toe.  In the interim, he has been off-  loading with a modified Darco shoe.  He states he has been compliant with  this.  He is putting neosporin on the wound daily.   WOUND EXAM:  Inspection of the foot shows there is a large halo of callus  again surrounding this wound with a deeper ulcer.  The base of the ulcer  actually looks fairly satisfactory with some attempt at epithelialization.  The presence of the callus to this degree would suggest that we are not off-  loading properly.  I have extensively debrided the callus today.  Hemorrhage  was controlled with direct pressure.   DIAGNOSIS:  Wagner's grade 2 foot ulcer.  I think there is some improvement  here, but still inadequate pressure relief.   There is no evidence of an infection here.  I think the wound bed actually  looks fairly satisfactory.  We will continue the current treatment, although  I wonder if we are going to need to move to a full-contact cast.  The sandal  needed to be replaced today, as the other one was broken.  He also has  custom shoes that have been ordered that he does not yet have.   We will review him again in 2-weeks' time.  We will see if we can modify his  Darco.           ______________________________  Ricard Dillon, M.D.     MGR/MEDQ  D:  07/12/2006  T:  07/13/2006  Job:  [id number]

## 2011-02-04 NOTE — Assessment & Plan Note (Signed)
[facility]   NAME:  MONROE, VALONE NO.:  [id number]   MEDICAL RECORD NO.:  [id number]      DATE OF BIRTH:  Mar 27, 1956   PHYSICIAN:  Joneen Boers A. Nils Pyle, M.D. VISIT DATE:  08/15/2006                                     OFFICE VISIT   VITAL SIGNS:  Blood pressure 132/78, respirations 18, pulse rate 72, and he  is afebrile.  Capillary blood glucose is 160 mg percent.   PURPOSE OF TODAY'S VISIT:  Mr. Mcelwee is a 55 year old man with a Wagner  grade 2 diabetic foot ulcer, who has been treated for the last 2 weeks in a  total contact cast.  We had anticipated that he would be completely healed  on this return visit and he would make a transition into custom shoes and  inserts.  He has failed to procure his custom shoes and inserts.  In the  interim, he has denied excessive drainage, pain, swelling, or malodor.   WOUND EXAM:  Inspection of the left great toe shoes that there is 100  percent reepithelization.  There is no evidence of ulceration.   DIAGNOSIS:  Resolved Wagner 2 diabetic foot ulcer.   MANAGEMENT PLAN & GOAL:  We have instructed the patient to continue his  efforts at procuring his extra-depth shoes and custom inserts.  In the  meantime, he will not bear any weight on his left foot.  He will utilize his  walker and his motorized wheelchair to accomplish a nonweightbearing status.  We have emphasized the high likelihood of re-ulceration if he attempts to  walk without custom orthotics.  The patient seems to understand.  We have  rewritten his prescription for bilateral extra-depth shoes and custom  inserts to be filled at [facility] [employer].  We will  reevaluate the patient once he has received the prescribed footwear.           ______________________________  Epifania Gore. Nils Pyle, M.D.     [name]  D:  08/15/2006  T:  08/16/2006  Job:  [id number]   cc:   [name], MD

## 2011-02-04 NOTE — Discharge Summary (Signed)
NAMEFOUNTAIN, LICEAGA NO.:  [id number]   MEDICAL RECORD NO.:  [id number]          PATIENT TYPE:  INP   LOCATION:  [id number]                         [facility]AC[facility]   PHYSICIAN:  Jerelene Redden, MD      DATE OF BIRTH:  Jan 20, 1956   DATE OF ADMISSION:  08/06/2004  DATE OF DISCHARGE:                                 DISCHARGE SUMMARY   HISTORY OF PRESENT ILLNESS:  [name] is a 55 year old man with a  longstanding history of diabetes, and of noncompliance with his medications.  For one month prior to admission, he had stopped taking all of his diabetes  medications because he had observed that this was helpful in losing weight.  For one week prior to admission, he had experienced flu-like symptoms,  fever, headache, weakness, and a three-day history of genital ulcerations  which were draining purulent material.  He ultimately presented to the  emergency room on November 18.   PHYSICAL EXAMINATION:  VITAL SIGNS:  Temperature 100.5.  GENERAL:  He was a morbidly obese man who was in significant distress  secondary to pain in the pelvic area.  HEENT:  Exam was within normal limits.  CHEST:  Remarkable for decreased breath sounds diffusely.  CARDIOVASCULAR:  Exam revealed sinus tachycardia.  ABDOMEN:  Obese.  GU:  On genital exam, the patient was noted to have bilateral inguinal  vesicular lesions with copious foul-smelling, greenish, purulent material  overlying the vesicles.  Vesicles were present on the testicle, penis and  perineal area.  EXTREMITIES:  Examination of the extremities was unremarkable.   LABORATORY DATA:  Relevant initial laboratory studies included a sodium of  125, potassium 3.9, creatinine 1.2, BUN 12.  Glucose was 337.  White count  was 7200.  Because of complaints of headache, the patient underwent a spinal  fluid examination, the results of which were unremarkable.  Other relevant  laboratory studies obtained either initially or  subsequently included a  herpes culture of the genital lesions which revealed herpes simplex type 1,  herpes culture of the CSF by PCR was negative.  Gonorrhea and chlamydia  studies were negative.  RPR was nonreactive.  Cultures of the purulent  material on the perineal area grew moderate yeast.  Urine cultures were  negative.  Spinal fluid routine cultures were negative.  The patient was  found to have a urine microalbumin of 11.6 which is an elevated level.  Lipid profile is notable for an LDL cholesterol of 107, and an HDL  cholesterol of 12.  Liver profile was within normal limits.  A1c hemoglobin  was 15.6.   On admission, the patient was started on appropriate therapy for what was  presumed to be primary genital herpes complicated by a secondary bacterial  infection.  He was placed on acyclovir 550 mg IV every 8 hours, vancomycin 2  grams IV every 24 hours, and Zosyn 3.375 grams IV every 6 hours.  He was  placed on sliding scale insulin, and his Glucotrol and metformin were  resumed.  In light of the finding of significant proteinuria, lisinopril was  instituted.  A PICC line was required in order to facilitate intravenous  antibiotic therapy.  By November 21, the patient's overall status had  improved.  IV vancomycin was discontinued.  IV Zovirax was stopped, and the  patient was placed on Valtrex 1 gram p.o. b.i.d.  By November 28, it should  be pointed out that the patient had received a total of nine days of Valtrex  therapy with 10 days being planned.   On November 26, the patient was switched from Zosyn to p.o. Augmentin which  he tolerated well.  Zocor was instituted in light of his moderate elevation  of LDL cholesterol.  The patient's blood sugar came under good control  during the last several days of his hospitalization as the infectious  process improved.   By November 29, the patient appeared to be ready for discharge.   DISCHARGE DIAGNOSES:  1.  Primary genital  herpes.  2.  Secondary bacterial infection and probable superficial fungal infection.  3.  Uncontrolled diabetes with A1c hemoglobin greater than 15, complicated      by proteinuria.  4.  Unacceptable lipid elevation in a patient with diabetes.  5.  Peripheral neuropathy complicated diabetes.  6.  Morbid obesity.   DISCHARGE MEDICATIONS:  1.  Aspirin 325 mg daily.  2.  Glucotrol XL 10 mg daily.  3.  Valtrex 1 gram p.o. b.i.d. for two more days.  4.  Zocor 40 mg daily.  5.  Lisinopril 10 mg daily.  6.  Doxycycline 100 mg b.i.d. for four more days.  7.  Augmentin 500 mg b.i.d. for four more days.  8.  Nystatin cream, apply to perineal area for approximately five more days.  9.  Glucophage 500 mg b.i.d. a.c. meals.   It has been explained to the patient that his condition may turn out to be  recurrent, and that if he develops recurrent vesicular lesions, he should  make his physician aware, and should use a condom for sexual relations.  He  was also advised that it is essential that he receive regular medical care.  He and his wife indicate that their plan is to follow up with [facility] in light of their insurance coverage.   The patient states that his wife has made arrangements for them to follow up  with Dr. [name], and I am sending a copy of this discharge  summary to Dr. Linda Hedges' office.  I encouraged them to make an appointment  with Dr. Linda Hedges at the time of discharge in about 10-14 days.   DISCHARGE CONDITION:  Fair.      SY/MEDQ  D:  08/17/2004  T:  08/17/2004  Job:  [id number]   cc:   [name], M.D. [facility]   Synthia Innocent, M.D.  [address] [place]   [id number]  Fax: [phone]

## 2011-02-04 NOTE — Assessment & Plan Note (Signed)
[facility]                           PRIMARY CARE OFFICE NOTE   NAME:[name] [name]                        MRN:          [id number]  DATE:08/18/2006                            DOB:          06-10-1956    [name] [name] is a 56 year old morbidly-obese African-American gentleman  who presents to discuss his blood sugars.  He reports that his CBGs have  been running high, in the 300+ range, and he has been unable to get them  down.  He otherwise is feeling well.   Oncology:  The patient has completed his chemotherapy following surgery  for colorectal cancer and is doing well.   CURRENT MEDICATIONS:  1. Metformin 1000 mg b.i.d.  2. Glipizide 10 mg daily.  3. Bisoprolol 10 mg daily.  4. Lantus 25 units q.h.s.  5. Potassium 10 mEq daily.  6. Lovastatin 20 mg daily.   REVIEW OF SYSTEMS:  Negative for any constitutional, cardiovascular,  respiratory or GI problems.   LIMITED EXAMINATION:  Temperature was 97.6, blood pressure 145/87, pulse  was 75, last weight was over 400 pounds.  General appearance:  This is  an obese African-American gentleman who is in no acute distress.  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   ASSESSMENT AND PLAN:  1. Diabetes.  The patient is on a very low dose of Lantus based on his      weight.  I explained to him that he could go up to 75-100 units of      Lantus and that would be within normal limits.  Plan:  The patient      to continue metformin and glyburide.  He is to increase his Lantus      to 30 units q.h.s. and he is to adjust every 3 days based on before-      breakfast CBGs so that if they are greater than 150 he is to      increase by 5 units of Lantus.  He may do this on every-3-day cycle      until he gets to at least 70 units of insulin before I would need      to see him back.  2. Hypertension, borderline control.  We will continue his present      medications.  3. Oncology.  The patient stable following  his surgery and      chemotherapy.  He does seem to have made a good recovery and is in      remission.   The patient will return to see me on a p.r.n. basis.     [name] Knuckles Norins, MD  Electronically Signed    MEN/MedQ  DD: 08/20/2006  DT: 08/21/2006  Job #: [id number]

## 2011-02-04 NOTE — Consult Note (Signed)
NAMEMATTHIAS, COWSERT NO.:  [id number]   MEDICAL RECORD NO.:  [id number]         PATIENT TYPE:  INP   LOCATION:  [id number]                         FACILITY:  [facility]   PHYSICIAN:  Epifania Gore. Nils Pyle, M.D.DATE OF BIRTH:  11/27/1955   DATE OF CONSULTATION:  01/31/2005  DATE OF DISCHARGE:  08/17/2004                                   CONSULTATION   REASON FOR CONSULTATION:  This 55 year old diabetic is referred by Dr.  Linda Hedges for evaluation of chronic ulceration of his left foot.   IMPRESSION:  Neurotrophic ulcer, Wagener's III, left metatarsophalangeal  joint volar aspect of the foot.   RECOMMENDATIONS:  The wound was debrided.  The callus was pared in the  office setting.  The patient was placed in a dry dressing with Neosporin  topical antibiotic and fitted for a Darco shoe.  He will be seen on a weekly  basis and will currently be fitted for an extra depth custom shoe and custom  inserts to relieve his recurrent pressure.   SUBJECTIVE:  The patient is a 55 year old diabetic who is diet controlled  and recently moved from [place], [place].  He had been under the care  of a podiatrist in [place] and was treated with sequential parings.  His  ulcer has been present for 3 months and is appearing to get worse.  It is  not particularly painful, is not associated with swelling or fever.  His  past medical history is remarkable for diabetes, hypertension,  hypercholesterolemia, a cardiomyopathy, and his morbid obesity.  His current  medications include:   1.  Glipizide 10 mg b.i.d.  2.  Zocor 40 mg daily.  3.  Lisinopril 10 mg a day.  4.  Lasix 80 mg a day.  5.  ECASA 81 mg daily.  6.  Coreg 6.5 mg b.i.d.  7.  Metformin 1000 mg b.i.d.  8.  Prandin 0.5 mg a.c.  9.  He has previously been on prednisone.   His past surgeries included bilateral corneal transplants.  He has had a  cardiac catheterization in 2004 and echocardiogram in December 2005.  He has  had  bilateral hip pins secondary to trauma and is allergic to [facility].  He  is a nonsmoker.  His family history is positive for diabetes and heart  disease.  His brother died of cardiomyopathy with heart failure.  Socially,  he is unemployed.  He and his wife reside in [place].  Review of systems  shows that he has some exercise intolerance due to exhaustion, felt to be  related to his underlying cardiomyopathy.  He has occasional knee pain but  specifically denies angina pectoris.   PHYSICAL EXAMINATION:  GENERAL:  He is alert, oriented, pleasant, responding  appropriately to questioning.  HEENT:  Clear.  NECK:  Supple.  Trachea is midline.  Thyroid is nonpalpable.  LUNGS:  Clear.  HEART:  Heart sounds are distant.  There is obvious prominence of his B wave  at 30 degrees inclination.  ABDOMEN:  Robust with a prominent panniculus and indeterminate masses.  EXTREMITIES:  The pedal  pulses are 3+ bilaterally.  On the left lower  extremity, there is a chronic-appearing ulceration with a halo of thickened  callus and necrotic tissue toward the center with evidence of granulation.  The wound measurements are listed on the wound expert.  Please refer there  too.  NEUROLOGICAL:  The patient is insensate.  SKIN:  There are no dominant skin lesions.  LYMPH:  There is no regional adenopathy.   DISCUSSION:  The patient presents with a chronic ulceration, most likely  related to the insensate nature of his lower extremities and his malfitting  shoes.  Our clinical approach will be to pear the callus, debride the wound,  and place the patient in a nonabrasive footwear (Darco shoe).  We will  concurrently order extra depth shoes and custom inserts.  We will see the  patient on a weekly basis and change our clinical approach as indicated by  his response.      HAN/MEDQ  D:  01/31/2005  T:  01/31/2005  Job:  [id number]   cc:   [name], M.D. [facility]

## 2011-02-04 NOTE — Assessment & Plan Note (Signed)
[facility]   NAME:  [name], [name] NO.:  [id number]   MEDICAL RECORD NO.:  [id number]          DATE OF BIRTH:   PHYSICIAN:  Epifania Gore. Nils Pyle, M.D. VISIT DATE:  03/27/2006                                     OFFICE VISIT   VITAL SIGNS:  Vital signs are stable.  He is afebrile.   PURPOSE OF TODAY'S VISIT:  [name] returns after a debridement that was  performed on this past Friday.  During the interim, he has used crutches,  and avoided weightbearing.  He returns with his previously issued Darco shoe  for modification.  He denies fever, drainage or malodor.   WOUND EXAM:  Inspection of the wound shows that there has been a recurrence  of the exudative drainage and some minimum increase in the __________.   WOUND SINCE LAST VISIT:   CHANGE IN INTERVAL MEDICAL HISTORY:   DIAGNOSIS:  Adequate offloading with the Darco, and adequate debridement  clinically.   TREATMENT:  The wound was debrided with healthy bleeding stimulated and  which was controlled with direct pressure.  The wound was irrigated with  saline, and a dressing of Silvercel was applied.  The  shoe was inspected  and 2  1/4-inch felt strips were placed underneath the head of the metatarsal to  offload the MP joint and the lateral IP joint also.   ANESTHETIC USED:   TISSUE DEBRIDED:   LEVEL:   CHANGE IN MEDS:   COMPRESSION BANDAGE:   OTHER:   MANAGEMENT PLAN & GOAL:  We will see the patient for reevaluation in one  week.           ______________________________  Epifania Gore. Nils Pyle, M.D.     [name]  D:  03/27/2006  T:  03/27/2006  Job:  [id number]

## 2011-02-04 NOTE — Op Note (Signed)
NAMEDEARION, [name] NO.:  [id number]   MEDICAL RECORD NO.:  [id number]          PATIENT TYPE:  INP   LOCATION:  [id number]                         FACILITY:  [facility]   PHYSICIAN:  Georgina Quint, M.D.   DATE OF BIRTH:  [birth year]   DATE OF PROCEDURE:  10/04/2005  DATE OF DISCHARGE:                                 OPERATIVE REPORT   PREOPERATIVE DIAGNOSIS:  Carcinoma of the sigmoid colon.   POSTOPERATIVE DIAGNOSIS:  Carcinoma of the sigmoid colon.   OPERATION:  Sigmoid colectomy with a low pelvic anastomosis.   SURGEON:  Georgina Quint, MD.   ASSISTANT:  Shellia Carwin, MD.   ANESTHESIA:  General.   DESCRIPTION OF PROCEDURE:  After the patient was monitored and anesthetized  and had a Foley catheter and had routine preparation and draping of the  abdomen, I made a midline incision from just above the umbilicus down to the  area just above the pubis. I dissected down through the abdominal fat which  was very thick down to the midline fascia and opened the midline fascia for  the entire length of the wound. I then explored the abdomen and found that  he had one large gallstone in the gallbladder and that he had a normal  feeling liver with no evidence of any metastatic disease. There was no  evidence of peritoneal or mesenteric metastatic disease. I then palpated the  tumor on the distal sigmoid colon. I packed the small bowel cephalad, filled  it with tapes, a Balfour extender retractor. I then mobilized the sigmoid  colon laterally. It was fairly redundant and so in order to facilitate  dissection, I divided it with a cutting stapler in the mid sigmoid region  and dissected straight down through the mesentery ligating the vessels with  2-0 silk sutures or suture ligatures. I visualized the left ureter and did  not harm it. I took the dissection down along the sacral promontory and on  down into the pelvis. In order to get a good margin of tissue beyond the  tumor, it was necessary to go quite low right down to the peritoneal  reflection. I brought the dissection back up to the bowel at that point and  put stay sutures on the lateral edges of it and divided it and then finished  the mesenteric dissection, gaining hemostasis with cautery, ligatures,  suture ligatures or clips as appropriate. After the resection, I sent the  specimen over to the pathologist and they reported that there was at least a  2.5 cm distal margin of resection and I felt this was adequate. I then  checked the area for hemostasis and found it to be good. I reinforced the  staple line on the proximal bowel using Lembert type 2-0 silk sutures. I  then made a hole in the antimesenteric aspect of the distal part of the  proximal colon and performed an end to side anastomosis with a single layer  using 2-0 and 3-0 silk sutures and reinforcing where necessary with Lembert  type sutures. I then used Tisseel to further seal the  anastomosis. I closed  the mesenteric defect with a few sutures of 2-0 silk. I irrigated the pelvis  and removed the  irrigant and felt my an anastomosis was watertight and that vascularity was  good. Sponge, needle and instrument counts were correct. I closed the fascia  with running #1 PDS and closed the skin with staples after irrigating the  subcutaneous tissue. He went to PACU in stable condition.      Georgina Quint, M.D.  Electronically Signed     WB/MEDQ  D:  10/04/2005  T:  10/05/2005  Job:  [id number]

## 2011-02-04 NOTE — H&P (Signed)
[name], STANDISH NO.:  [id number]   MEDICAL RECORD NO.:  [id number]          PATIENT TYPE:  INP   LOCATION:  [id number]                         FACILITY:  [facility]   PHYSICIAN:  Georgina Quint, M.D.   DATE OF BIRTH:  03-16-1956   DATE OF ADMISSION:  10/04/2005  DATE OF DISCHARGE:  10/11/2005                                HISTORY & PHYSICAL   CHIEF COMPLAINT:  Cancer of the colon   PRESENT ILLNESS:  The patient is a 55 year old black male who was found on  colonoscopy about a month ago to have a carcinoma of the sigmoid colon.  He  deferred surgery at that time for personal and business reasons and is  admitted to the hospital for colectomy following outpatient bowel prep.  He  accepts the risks of colectomy.  A CT scan has been done showing no evidence  of metastatic disease.   PAST MEDICAL HISTORY:  1.  The patient has suffered for a long time from obesity and is considering      having weight reduction surgery.  2.  He has type 2 diabetes.  3.  Hypertension.  4.  Sleep apnea.  5.  He has had, in the past, a corneal transplant.  6.  Bilateral hip pinning.  He never had any problems with anesthesia.   He has allergies to [facility].   MEDICATIONS:  I can not find the list at the time of dictation.   REVIEW OF SYSTEMS:  The patient denies any shortness of breath, any chest  pain, any chronic foot problems, other complications of diabetes.  He has  been taking his medicines.   He does not smoke.  He drinks alcoholic beverages only occasionally.   PHYSICAL EXAMINATION:  VITAL SIGNS:  Temperature and vital signs per nurse.  MENTAL STATUS:  The patient has normal mental status.  HEAD/NECK:  Unremarkable.  CHEST:  Clear to auscultation.  HEART:  Rate and rhythm normal.  No murmur or gallop.  ABDOMEN:  No mass, tenderness, or organomegaly.  He is extremely obese.  EXTREMITIES:  Some calluses present otherwise unremarkable.  Remainder of  the examination  unremarkable.   IMPRESSION:  Carcinoma of the sigmoid colon.   PLAN:  Sigmoid colectomy.      Georgina Quint, M.D.  Electronically Signed     WB/MEDQ  D:  10/15/2005  T:  10/15/2005  Job:  [id number]

## 2011-02-04 NOTE — Assessment & Plan Note (Signed)
[facility]   NAME:  [name], [name] NO.:  [id number]   MEDICAL RECORD NO.:  [id number]      DATE OF BIRTH:  1956/04/09   PHYSICIAN:  Joneen Boers A. Nils Pyle, M.D. VISIT DATE:  04/03/2006                                     OFFICE VISIT   SUBJECTIVE:  [name] is being followed for a diabetic foot ulcer on the  left great toe.  He has been managed with a Darco off-loading shoe  supplemented with felt strips.  During the interim, he reports that there  has been decrease in drainage.   OBJECTIVE:  His vital signs are stable.  He is afebrile.  Inspection of the  wound shows that there is a heavy halo of callous and some necrosis at the  periphery.  The center of the wound shows healthy appearing granulation with  minimum drainage.  The wound was full thickness debrided with rongeurs and  scalpel with hemorrhage controlled with diuretic pressure and silver  nitrate.   ASSESSMENT:  Healing wound.   PLAN:  We have given the patient a prescription for custom shoes and inserts  to provide for more effective off loading.  During the interim, he is to  continue to wear the Darco shoe with the felt strips for off loading.  We  will reevaluate him in one week.           ______________________________  Epifania Gore. Nils Pyle, M.D.     [name]  D:  04/03/2006  T:  04/03/2006  Job:  [id number]

## 2011-02-04 NOTE — Consult Note (Signed)
NAMECORRIN, [name].:  [id number]   MEDICAL RECORD [name].:  [id number]          PATIENT TYPE:  OIB   LOCATION:  [id number]                         FACILITY:  [facility]   PHYSICIAN:  Georgina Quint, M.D.   DATE OF BIRTH:  1956/07/22   DATE OF CONSULTATION:  08/31/2005  DATE OF DISCHARGE:                                   CONSULTATION   CHIEF COMPLAINT:  Colon cancer.   PRESENT ILLNESS:  A 55 year old black male who had recent rectal bleeding.  He underwent screening colonoscopy for investigation and was found to have a  tumor of the sigmoid colon which is highly suspicious for cancer.  He has  not had obstructive symptoms.  He was admitted for observation, CT scan, and  colectomy.   PAST MEDICAL HISTORY:  1.  He has type 2 diabetes.  2.  He has high blood pressure and hyperlipidemia.  3.  He denies heart, lung problems.   He is on metformin, glipizide, lisinopril, Lipitor, aspirin and over-the-  counter pain medicines.  Other medical problems are morbid obesity and  obstructive sleep apnea requiring CPAP.  He has had corneal transplants.  He  has had bilateral hip surgery.   FAMILY HISTORY:  Childhood illnesses are unremarkable.  He is under  consideration for gastric bypass surgery at [facility].  He does not smoke.   REVIEW OF SYSTEMS:  Unremarkable for chest pain, shortness of breath, any  unexplained pains or other symptoms suggesting metastasis.   PHYSICAL EXAMINATION:  GENERAL:  Alert black male with normal mental status,  [name] acute distress.  He is very obese.  HEAD AND NECK:  Unremarkable with [name] masses in the neck.  [name] supraclavicular  adenopathy.  [name] thyroid enlargement.  CHEST:  Clear to auscultation.  HEART:  Rate and rhythm are normal.  [name] murmur or gallop.  ABDOMEN:  Massively obese, nondistended, nontender, [name] mass, or organomegaly  detectable.  RECTAL:  Not done.  EXTREMITIES:  Trace edema.  [name] skin lesions.  Good pulses.  SKIN:  [name] lesions were  noted.  NEUROLOGICAL:  Grossly normal.   IMPRESSION:  1.  Carcinoma of the sigmoid colon.  2.  Type 2 diabetes.  3.  Morbid obesity.  4.  Hypertension.  5.  Hyperlipidemia.   PLAN:  We will get a CT scan of the abdomen tonight.  The patient wants to  go ahead with colectomy as soon as we can, and I will try to do that  tomorrow for him about mid day.  If he issues arise that would produce need  for delay, they will be addressed beforehand.  The patient accepts risks of  operation and necessary convalescence of about a week in the hospital and  about a month until he has fully recovered.      Georgina Quint, M.D.  Electronically Signed     WB/MEDQ  D:  08/31/2005  T:  09/01/2005  Job:  [id number]   cc:   Gatha Mayer, M.D. [place]  [place],  [id number]

## 2011-02-04 NOTE — Op Note (Signed)
NAME:  [name], [name] NO.:  [id number]   MEDICAL RECORD NO.:  [id number]          PATIENT TYPE:  AMB   LOCATION:  DAY                          FACILITY:  [facility]   PHYSICIAN:  Georgina Quint, M.D.   DATE OF BIRTH:  1956-09-09   DATE OF PROCEDURE:  01/09/2006  DATE OF DISCHARGE:                                 OPERATIVE REPORT   PREOPERATIVE DIAGNOSIS:  Colon cancer.   POSTOPERATIVE DIAGNOSIS:  Colon cancer.   OPERATION:  Implantation of central venous access port.   SURGEON:  Dr. Deon Pilling   ANESTHESIA:  Local with sedation.   PROCEDURE:  After the patient was monitored and sedated and had routine  preparation and draping of the anterior chest and neck, I liberally infused  local anesthetic below the clavicle in the deltopectoral groove and in the  anterior chest wall just below that.  With the patient in Trendelenburg  position, I made a small cut in the deltopectoral groove, dilated the  tissues under the clavicle, and accessed the subclavian vein with the first  stick of a large needle and then passed the J-wire through for use in the  Seldinger insertion technique.  I then confirmed that the wire went down  into the right atrium.  I then dissected a pocket on the anterior chest wall  just a couple of centimeters below the first incision for placement of the  port.  After getting a pocket of adequate size in the superficial  subcutaneous tissues, I implanted the port suturing it down with 2 sutures  of 2-0 Prolene, and it lay well covered by the skin in comfortable position.  I fed the tubing through to the first incision.  I used fluoroscopy to cut  the venous tubing to estimated necessary length.  I then passed the dilator  and introducer assembly over the J-wire, confirmed intravascular position by  withdrawal of venous blood, then removed the dilator and passed the catheter  through the introducer.  It went down easily, and then I peeled away the  introducer sheath.  Using fluoroscopy, it appeared to me that the tip of the  catheter was at about the cavoatrial junction and that the catheter had no  kinks or angulations which would interfere with its use.  I used a Huber  needle to access the port and withdrew venous blood and saw that it flushed  nicely.  I then filled it with a heparin solution of 100 u/mL.  I closed the  skin incisions with intracuticular 4-0 Vicryl and Steri-Strips and applied a  protective bandage.  The patient tolerated the operation well.      Georgina Quint, M.D.  Electronically Signed     WB/MEDQ  D:  01/09/2006  T:  01/10/2006  Job:  [id number]

## 2011-02-04 NOTE — Assessment & Plan Note (Signed)
[facility]   NAME:  JAHARI, HANSFORD NO.:  [id number]   MEDICAL RECORD NO.:  [id number]      DATE OF BIRTH:  Aug 09, 1956   PHYSICIAN:  Joneen Boers A. Nils Pyle, M.D. VISIT DATE:  03/24/2006                                     OFFICE VISIT   SUBJECTIVE:  Mr. Larmore returns for follow-up of an ulceration of his left  great toe.  During the interim the patient has been admitted to the hospital  and has undergone a colon resection and is now undergoing chemotherapy for  his malignancy.  His wound apparently improved while he was incapacitated  due to his surgery but as he has increased his activity he has noted that  the wound has returned.  He has denied fever.  He is not on antibiotics.  His glucoses have been reasonable.   OBJECTIVE:  VITAL SIGNS:  His vital signs are stable.  He is afebrile.  His  capillary blood glucose fasting was 118 yesterday.  EXTREMITIES:  The inspection of the foot and leg shows that there is a  persistence of 2+ edema.  On the left great toe medially there is a large  necrotic heavy callus and draining ulceration.  This wound was photographed  and entered into the Wound Expert.  Thereafter, a full thickness debridement  of callus and necrotic tissue were removed.  Hemorrhage was controlled with  silver nitrate.  The patient is anesthetic.  Pedal pulses remain palpable.   ASSESSMENT:  Recurrent diabetic foot ulcer Wagner 2 left great toe.   PLAN:  We dressed the wound using Silvercel and a cotton gauze.  We have  instructed him to be seen in the clinic in 48 hours and to bring his  previous Darco shoe.  We may need to modify that shoe to provide better off-  loading.  Patient seems to understand.  We will see him at that time.           ______________________________  Epifania Gore Nils Pyle, M.D.     [name]  D:  03/24/2006  T:  03/24/2006  Job:  [id number]

## 2011-02-10 ENCOUNTER — Ambulatory Visit (HOSPITAL_COMMUNITY): Payer: Medicare Other | Attending: Cardiology | Admitting: Radiology

## 2011-02-10 DIAGNOSIS — E119 Type 2 diabetes mellitus without complications: Secondary | ICD-10-CM | POA: Insufficient documentation

## 2011-02-10 DIAGNOSIS — R011 Cardiac murmur, unspecified: Secondary | ICD-10-CM

## 2011-02-10 DIAGNOSIS — R0989 Other specified symptoms and signs involving the circulatory and respiratory systems: Secondary | ICD-10-CM | POA: Insufficient documentation

## 2011-02-10 DIAGNOSIS — R0609 Other forms of dyspnea: Secondary | ICD-10-CM | POA: Insufficient documentation

## 2011-02-10 DIAGNOSIS — I379 Nonrheumatic pulmonary valve disorder, unspecified: Secondary | ICD-10-CM | POA: Insufficient documentation

## 2011-02-18 ENCOUNTER — Telehealth: Payer: Self-pay | Admitting: Cardiology

## 2011-02-18 NOTE — Telephone Encounter (Signed)
Attempted to call pt again- still no answer and no machine.

## 2011-02-18 NOTE — Telephone Encounter (Signed)
Pt rtn call to get test results

## 2011-02-18 NOTE — Telephone Encounter (Signed)
Called pt--no answer-no answering machine--nt

## 2011-02-21 ENCOUNTER — Telehealth: Payer: Self-pay | Admitting: Cardiology

## 2011-02-21 NOTE — Telephone Encounter (Signed)
Pt notified of echo results done 02/10/11.

## 2011-02-21 NOTE — Telephone Encounter (Signed)
Pt rtn call re test results

## 2011-02-21 NOTE — Telephone Encounter (Signed)
See phone note dated 02/21/11

## 2011-02-21 NOTE — Telephone Encounter (Signed)
Will forward to Desiree Lucy, RN for Dr. Aundra Dubin.

## 2011-02-25 ENCOUNTER — Telehealth: Payer: Self-pay | Admitting: *Deleted

## 2011-02-25 NOTE — Telephone Encounter (Signed)
Patient c/o chest and shoulder pain off and on. Symptoms increased this am - No sweating, dizziness, or SOB. He was advised to come in office now for EKG and eval from Dr Linda Hedges. Pt says is is unable to b/c of transportation. Advised ER if symptoms became worse - he agreed.

## 2011-02-28 ENCOUNTER — Encounter: Payer: Self-pay | Admitting: Internal Medicine

## 2011-02-28 ENCOUNTER — Ambulatory Visit (INDEPENDENT_AMBULATORY_CARE_PROVIDER_SITE_OTHER): Payer: Medicare PPO | Admitting: Internal Medicine

## 2011-02-28 DIAGNOSIS — M25519 Pain in unspecified shoulder: Secondary | ICD-10-CM

## 2011-02-28 DIAGNOSIS — R079 Chest pain, unspecified: Secondary | ICD-10-CM

## 2011-02-28 DIAGNOSIS — M25511 Pain in right shoulder: Secondary | ICD-10-CM

## 2011-02-28 DIAGNOSIS — E669 Obesity, unspecified: Secondary | ICD-10-CM

## 2011-02-28 NOTE — Progress Notes (Signed)
  Subjective:    Patient ID: [name], male    DOB: 02-Aug-1956, 55 y.o.   MRN: [id number]  HPI [name] presents for evaluation of left chest pain last Thursday and Friday. He had no change in activity. He had no diaphoresis, no SOB. He was upset. He was unable to come in until today. He reports that his pain resolved. He has had some pain in the right neck and the right shoulder. He is otherwise doing OK. He had recently been seen by cardiology and had a normal EKG and was deemed to be cardiac stable at that visit.   PMH, FamHx and SocHx reviewed for any changes and relevance.    Review of Systems Review of Systems  Constitutional:  Negative for fever, chills, activity change and unexpected weight change.  HEENT:  Negative for hearing loss, ear pain, congestion, neck stiffness and postnasal drip. Negative for sore throat or swallowing problems. Negative for dental complaints.   Eyes: Negative for vision loss or change in visual acuity.  Respiratory: Negative for chest tightness and wheezing.   Cardiovascular: Negative for chest pain and palpitation. No decreased exercise tolerance Gastrointestinal: No change in bowel habit. No bloating or gas. No reflux or indigestion Genitourinary: Negative for urgency, frequency, flank pain and difficulty urinating.  Musculoskeletal: Negative for myalgias, back pain, arthralgias and gait problem except for pain in the right shoulder and proximal right UE.Marland Kitchen  Neurological: Negative for dizziness, tremors, weakness and headaches.  Hematological: Negative for adenopathy.  Psychiatric/Behavioral: Negative for behavioral problems and dysphoric mood.       Objective:   Physical Exam Vitals reviewed. Gen'l - obese AA man, but noticeablly thinner, in no distress HEENT - nl Pul - no increased WOB, normal respirations noted CV- 2+ radial pulse, heart sounds distant but regular.       Assessment & Plan:  1. Chest pain - very atypical discomfort  that temporally coincides with emotional distress in dealing with DME coverage. EKG is normal and unchanged from recent EKG.  Plan - continue with risk modification.  2. Shoulder pain , right - suspect mild bursitis.  Plan - ROM demonstrated and he is instructed to do routine exercise with ROm and stretching daily. He may work out using low weight, high rep approach.   3. Morbid obesity - he has lost almost 100 lbs after lap band procedure.  Plan - continued weight loss via decreased total calorie intake.

## 2011-03-04 ENCOUNTER — Encounter: Payer: Self-pay | Admitting: Internal Medicine

## 2011-03-04 ENCOUNTER — Telehealth: Payer: Self-pay | Admitting: *Deleted

## 2011-03-04 NOTE — Telephone Encounter (Signed)
FYI..The patient left the office before the visit was finished. msg on vm stating pt has appt on Monday 03/07/11 for mobility evaluation. Faxed over forms for md to complete

## 2011-03-07 ENCOUNTER — Encounter: Payer: Self-pay | Admitting: Internal Medicine

## 2011-03-07 ENCOUNTER — Other Ambulatory Visit: Payer: Self-pay | Admitting: *Deleted

## 2011-03-07 ENCOUNTER — Ambulatory Visit (INDEPENDENT_AMBULATORY_CARE_PROVIDER_SITE_OTHER): Payer: Medicare PPO | Admitting: Internal Medicine

## 2011-03-07 DIAGNOSIS — E1149 Type 2 diabetes mellitus with other diabetic neurological complication: Secondary | ICD-10-CM

## 2011-03-07 DIAGNOSIS — E669 Obesity, unspecified: Secondary | ICD-10-CM

## 2011-03-07 DIAGNOSIS — I428 Other cardiomyopathies: Secondary | ICD-10-CM

## 2011-03-07 MED ORDER — INSULIN DETEMIR 100 UNIT/ML ~~LOC~~ SOLN: 30.0000 [IU] | Freq: Two times a day (BID) | SUBCUTANEOUS | Status: DC

## 2011-03-07 NOTE — Progress Notes (Signed)
Subjective:    Patient ID: [name], male    DOB: 1956-01-27, 55 y.o.   MRN: [id number]  HPI [name] presents today for a mobility exam. He has been using a power w/c for many years. He has multiple co-morbities which drastically limit his ability to engage in activities outside of the home due to great difficulty with mobility. He has a diabetic peripheral neuropathy, he has an ischemic cardiomyopathy, he has morbid obesity grade IV. Subsequent to these problems he has increased fall risk, decreased upper body strength and balance problems. As noted he is able to manage his in-home ADLs: he can get from room to room to bathroom, he can get to the kitchen but must sit for meal preparation but without a power mobility device he becomes homebound.  Past Medical History  Diagnosis Date  . Diabetes mellitus     Has Hx of diabetic foot ulcer & peripheral neuropathy  . Hyperlipidemia   . HTN (hypertension)   . Colon cancer 2007    s/p sigmoid colectomy  . Morbid obesity   . Anemia   . OSA on CPAP   . CHF (congestive heart failure)     Presumed diastolic. Echo (06/07) w.EF 45%, severe posterior HK, mild LV hypertrophy, No further work-up of abnormal echo was done.  Marland Kitchen History of PFTs 05/2009    Mild Obstructive defect   Past Surgical History  Procedure Date  . Sigmoid colectomy 10/2005    [facility]  . Corneal transplant [place]    '80/Right  '84/Left  . Bariatric surgery 12/2009    Lap Band/At [facility]   Family History  Problem Relation Age of Onset  . Hepatitis Mother     hepatitis C  . Heart attack Brother 64  . Cancer Neg Hx     colon or prostate   History   Social History  . Marital Status: Legally Separated    Spouse Name: N/A    Number of Children: N/A  . 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 Smoking status: Never Smoker   . Smokeless tobacco: Not on file  . Alcohol Use: Yes     Only Occasional.  . Drug Use: No  . Sexually Active: Not on file   Other Topics Concern  . 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  HEENT:  Negative for hearing loss, ear pain, congestion, neck stiffness and postnasal drip. Negative for sore throat or swallowing problems. Negative for dental complaints.   Eyes: Negative for vision loss or change in visual acuity.  Respiratory: Negative for chest tightness and wheezing.   Cardiovascular: Negative for chest pain and palpitation. No decreased exercise tolerance Gastrointestinal: No change in bowel habit. No bloating or gas. No reflux or indigestion Genitourinary: Negative for urgency, frequency, flank pain and difficulty urinating.  Musculoskeletal: Negative for myalgias, back pain, arthralgias and gait problem.  Neurological: Negative for dizziness, tremors,  weakness and headaches.  Hematological: Negative for adenopathy.  Psychiatric/Behavioral: Negative for behavioral problems and dysphoric mood.       Objective:   Physical Exam Vital noted Genl;- Obese AA male in no acute distress HEENT- C&S clear,  Pulmonary - normal respirations with no increased WOB, no wheezing Cor - RRR Abdomen - massively obese. Ext - 1+ peripheral edema, no deformity.         Assessment & Plan:  1. Mobility assessment - [name] multiple co-morbidities restrict his ability to do anything outside the home without the assistance of a power moblity device, e.g. Power wheel chair.  He is not a candidate for a scooter due to his weight, balance issues and uper body weakness.   Plan - recommend a power wheel chair.

## 2011-03-25 ENCOUNTER — Telehealth: Payer: Self-pay | Admitting: *Deleted

## 2011-03-25 NOTE — Telephone Encounter (Signed)
[employer] called - Pt's power scooter was denied. Reason is that documented notes state that pt is able to use a wheeled walker to get around his home. The scooter is only approved by medicare if pt needs this assistance in the home.   Would you like to do a peer to peer? If yes, I will set up but we must contact [employer] by Monday at 12.

## 2011-03-27 NOTE — Telephone Encounter (Signed)
Patient clearly responded that he is able to manage in his home with a rolling walker. No appeal - he doesn't qualify by the CMS requirements.

## 2011-03-29 ENCOUNTER — Ambulatory Visit (HOSPITAL_BASED_OUTPATIENT_CLINIC_OR_DEPARTMENT_OTHER)
Admission: RE | Admit: 2011-03-29 | Discharge: 2011-03-29 | Disposition: A | Discharge status: Home / Self Care | Payer: Medicare PPO | Source: Ambulatory Visit | Attending: General Surgery | Admitting: General Surgery

## 2011-03-29 DIAGNOSIS — Z452 Encounter for adjustment and management of vascular access device: Secondary | ICD-10-CM

## 2011-03-29 DIAGNOSIS — Z85038 Personal history of other malignant neoplasm of large intestine: Secondary | ICD-10-CM | POA: Insufficient documentation

## 2011-03-29 NOTE — Op Note (Signed)
  [name], [name] NO.:  [id number]  MEDICAL RECORD NO.:  [id number]  LOCATION:                                 FACILITY:  PHYSICIAN:  Orson Ape. Coreen Shippee, M.D.DATE OF BIRTH:  Mar 18, 1956  DATE OF PROCEDURE:  03/29/2011 DATE OF DISCHARGE:                              OPERATIVE REPORT   PREOPERATIVE DIAGNOSES: 1. Non-used Port-A-Cath, right subclavian area. 2. History of colon cancer.  OPERATION:  Removal of non-used Port-A-Cath.  Local anesthesia surgery room in the surgery.  HISTORY:  [name] is a 55 year old fairly overweight male who went through a sigmoid colectomy about 3 years ago by Dr. Zenia Resides, had a Port-A- Cath and his last treatment for chemotherapy was about a year and a half ago.  He has not used the Port-A-Cath, he is not on Coumadin and he desires to have the Port-A-Cath removed.  He is followed by Dr. Adella Hare who is his medical doctor.  The patient is here, has not have breakfast, positioned comfortably on the OR table with head slightly elevated and then the area where the Port-A-Cath was, was prepped with Betadine surgical solution and draped in a sterile manner.  Permit had been signed.  The time-out completed.  I opened up the little incision where the Port-A-Cath had been placed. They saw __________ catheter. Sheath around it was encompassed with a 3-0 chromic suture and the catheter with removed and pursestring around the sheath tied.  I then identified the 2 Prolene sutures that were anchoring to the fascia. These were freed and then we were able to withdraw the Port-A-Cath quite easily.  The wound was subcutaneously closed with 3-0 chromic and then 2 half-inch Steri-Strips with Benzoin on the skin.  A little light occlusive dressing was placed and the patient will keep the area dry for 48 hours.  Tylenol should be fine for pain, and hopefully he will not have any problems.  I am happy to see him in the office in a  week if there is any question.  He has got an extra set of Steri-Strips so that he can apply and like to keep Steri-Strips on the incision for good 7-10 days if possible.     Orson Ape. Rise Patience, M.D.     WJW/MEDQ  D:  03/29/2011  T:  03/29/2011  Job:  [id number]  Electronically Signed by Jeanella Anton M.D. on 03/29/2011 03:51:49 PM

## 2011-04-04 ENCOUNTER — Other Ambulatory Visit: Payer: Self-pay | Admitting: *Deleted

## 2011-04-04 MED ORDER — FUROSEMIDE 40 MG PO TABS: 40.0000 mg | ORAL_TABLET | Freq: Two times a day (BID) | ORAL | Status: DC

## 2011-04-04 MED ORDER — SIMVASTATIN 40 MG PO TABS: 40.0000 mg | ORAL_TABLET | Freq: Every day | ORAL | Status: DC

## 2011-04-04 MED ORDER — INSULIN DETEMIR 100 UNIT/ML ~~LOC~~ SOLN: 30.0000 [IU] | Freq: Two times a day (BID) | SUBCUTANEOUS | Status: DC

## 2011-04-04 MED ORDER — METFORMIN HCL 1000 MG PO TABS: 1000.0000 mg | ORAL_TABLET | Freq: Two times a day (BID) | ORAL | Status: DC

## 2011-04-04 MED ORDER — LISINOPRIL 20 MG PO TABS: 20.0000 mg | ORAL_TABLET | Freq: Every day | ORAL | Status: DC

## 2011-04-08 ENCOUNTER — Encounter: Payer: Self-pay | Admitting: Internal Medicine

## 2011-04-08 DIAGNOSIS — Z0000 Encounter for general adult medical examination without abnormal findings: Secondary | ICD-10-CM | POA: Insufficient documentation

## 2011-04-11 ENCOUNTER — Encounter: Payer: Self-pay | Admitting: Internal Medicine

## 2011-04-11 ENCOUNTER — Ambulatory Visit (INDEPENDENT_AMBULATORY_CARE_PROVIDER_SITE_OTHER): Payer: Medicare PPO | Admitting: Internal Medicine

## 2011-04-11 DIAGNOSIS — M25472 Effusion, left ankle: Secondary | ICD-10-CM | POA: Insufficient documentation

## 2011-04-11 DIAGNOSIS — I10 Essential (primary) hypertension: Secondary | ICD-10-CM

## 2011-04-11 DIAGNOSIS — M25473 Effusion, unspecified ankle: Secondary | ICD-10-CM

## 2011-04-11 DIAGNOSIS — R079 Chest pain, unspecified: Secondary | ICD-10-CM

## 2011-04-11 DIAGNOSIS — M25579 Pain in unspecified ankle and joints of unspecified foot: Secondary | ICD-10-CM

## 2011-04-11 MED ORDER — CYCLOBENZAPRINE HCL 5 MG PO TABS: 5.0000 mg | ORAL_TABLET | Freq: Three times a day (TID) | ORAL | Status: AC | PRN

## 2011-04-11 NOTE — Assessment & Plan Note (Signed)
Moderate, with severe pain per pt, limits walking in the home, no longer has the power w/c available, to cont the hydrocodone for now, also for referral ortho - Dr Mable Fill ortho

## 2011-04-11 NOTE — Assessment & Plan Note (Signed)
Atypical, I suspect MSK related to recent increased walker use;  jsut had recent stess test neg approx 1 mo ago; for CXR today as he also had some cough, but will add flexeril prn ,  to f/u any worsening symptoms or concerns

## 2011-04-11 NOTE — Progress Notes (Signed)
Subjective:    Patient ID: [name], male    DOB: [birth year], 55 y.o.   MRN: [id number]  HPI  Here to f/u as Dr Linda Hedges not available,  Has been recently denies power w/c and now walking with roller walker in the house more, and now with new worsening bilat ankle pain with swelling on the left, overall mod to severe, found it hard to walk after the swelling started on the left ankle, no fever or warmth to the swelling on the left ankle, and no trauma, no fever.  Also with left upper and lateral chest pain, sharp, squeezing, off and on, moderate to pt, no other radiation, not obviously worse to deep breathing, not worse to ambulation as he does not move when the pain starts; may had some mild SOB with it, more coughing lately with min prod (worse in the morning, white);  No n/v, diaphoresis, palp, or dizziness or syncope;Marland Kitchen  Pt denies new neurological symptoms such as new headache, or facial or extremity weakness or numbnes  Pt denies polydipsia, polyuria, or low sugar symptoms such as weakness or confusion improved with po intake.  Pt states overall good compliance with meds, trying to follow lower cholesterol, diabetic diet, wt overall stable but little exercise however.   CBg's have been somewhat labile, more recently somewhat in the upper 100's. Denies worsening reflux, dysphagia, abd pain, n/v, bowel change or blood.  Past Medical History  Diagnosis Date  . Diabetes mellitus     Has Hx of diabetic foot ulcer & peripheral neuropathy  . Hyperlipidemia   . HTN (hypertension)   . Colon cancer 2007    s/p sigmoid colectomy  . Morbid obesity   . Anemia   . OSA on CPAP   . CHF (congestive heart failure)     Presumed diastolic. Echo (06/07) w.EF 45%, severe posterior HK, mild LV hypertrophy, No further work-up of abnormal echo was done.  Marland Kitchen History of PFTs 05/2009    Mild Obstructive defect   Past Surgical History  Procedure Date  . Sigmoid colectomy 10/2005    [facility]  . Corneal transplant  [place]    '80/Right  '84/Left  . Bariatric surgery 12/2009    Lap Band/At [facility]    reports that he has never smoked. He does not have any smokeless tobacco history on file. He reports that he drinks alcohol. He reports that he does not use illicit drugs. family history includes Heart attack (age of onset:35) in his brother and Hepatitis in his mother.  There is no history of Cancer. No Known Allergies Current Outpatient Prescriptions on File Prior to Visit  Medication Sig Dispense Refill  . BISOPROLOL FUMARATE PO Take 10 mg by mouth daily.        . furosemide (LASIX) 40 MG tablet Take 1 tablet (40 mg total) by mouth 2 (two) times daily.  60 tablet  4  . HYDROcodone-acetaminophen (LORCET) 10-650 MG per tablet Take 1 tablet by mouth every 6 (six) hours as needed.        . insulin detemir (LEVEMIR) 100 UNIT/ML injection Inject 30 Units into the skin 2 (two) times daily.  10 mL  6  . lisinopril (PRINIVIL,ZESTRIL) 20 MG tablet Take 1 tablet (20 mg total) by mouth daily.  30 tablet  6  . metFORMIN (GLUCOPHAGE) 1000 MG tablet Take 1 tablet (1,000 mg total) by mouth 2 (two) times daily with a meal.  60 tablet  11  . polyethylene glycol powder (  GLYCOLAX/MIRALAX) powder Take 17 g by mouth daily. Mix1 packet daily       . simvastatin (ZOCOR) 40 MG tablet Take 1 tablet (40 mg total) by mouth at bedtime.  30 tablet  6  . amoxicillin (AMOXIL) 875 MG tablet Take 875 mg by mouth 2 (two) times daily. X 10 days        Review of Systems Review of Systems  Constitutional: Negative for diaphoresis and unexpected weight change.  HENT: Negative for drooling and tinnitus.   Eyes: Negative for photophobia and visual disturbance.  Respiratory: Negative for choking and stridor.   Gastrointestinal: Negative for vomiting and blood in stool. though has some achy tender since the lap band for 72mo Genitourinary: Negative for hematuria and decreased urine volume.      Objective:   Physical Exam BP 110/60  Pulse  69  Temp(Src) 98.9 F (37.2 C) (Oral)  Ht 6' (1.829 m)  Wt 426 lb (193.232 kg)  BMI 57.78 kg/m2  SpO2 94% Physical Exam  VS noted Constitutional: Pt appears well-developed and well-nourished.  HENT: Head: Normocephalic.  Right Ear: External ear normal.  Left Ear: External ear normal.  Eyes: Conjunctivae and EOM are normal. Pupils are equal, round, and reactive to light.  Neck: Normal range of motion. Neck supple.  Cardiovascular: Normal rate and regular rhythm.   Pulmonary/Chest: Effort normal and breath sounds normal.  Abd:  Soft, NT, non-distended, + BS Neurological: Pt is alert. No cranial nerve deficit.  Skin: Skin is warm. No erythema. Bilat trace edema mid calf, varicose veins noted Psychiatric: Pt behavior is normal. Thought content normal.  left ankle 2+ effusion, minor tender, no erythema      Assessment & Plan:

## 2011-04-11 NOTE — Patient Instructions (Signed)
Take all new medications as prescribed  - the muscle relaxer Continue all other medications as before You will be contacted regarding the referral for: Dr Bednarz/orthopedic (this may take some time, if you need to see another MD sooner, please call) Please go to XRAY in the Basement for the x-ray test Please call the phone number [phone] (the [place]) for results of testing in 2-3 days;  When calling, simply dial the number, and when prompted enter the MRN number above (the Medical Record Number) and the # key, then the message should start.

## 2011-04-11 NOTE — Assessment & Plan Note (Signed)
stable overall by hx and exam, most recent data reviewed with pt, and pt to continue medical treatment as before  BP Readings from Last 3 Encounters:  04/11/11 110/60  03/07/11 136/72  02/28/11 120/70

## 2011-04-14 ENCOUNTER — Ambulatory Visit (INDEPENDENT_AMBULATORY_CARE_PROVIDER_SITE_OTHER)
Admission: RE | Admit: 2011-04-14 | Discharge: 2011-04-14 | Disposition: A | Discharge status: Home / Self Care | Payer: Medicare PPO | Source: Ambulatory Visit | Attending: Internal Medicine | Admitting: Internal Medicine

## 2011-04-14 DIAGNOSIS — R079 Chest pain, unspecified: Secondary | ICD-10-CM

## 2011-04-14 NOTE — Progress Notes (Signed)
Quick Note:  Voice message left on [employer] system - lab is negative, normal or otherwise stable, pt to continue same tx ______ 

## 2011-04-19 ENCOUNTER — Telehealth: Payer: Self-pay | Admitting: *Deleted

## 2011-04-19 NOTE — Telephone Encounter (Signed)
Refill request for Hydrocodone 10/650 SIG one tablet every 6 hours prn for pain.  QTY 120 please Advise

## 2011-04-20 MED ORDER — HYDROCODONE-ACETAMINOPHEN 10-650 MG PO TABS: 1.0000 | ORAL_TABLET | Freq: Four times a day (QID) | ORAL | Status: DC | PRN

## 2011-04-20 NOTE — Telephone Encounter (Signed)
Ok for refill  x3 

## 2011-04-21 ENCOUNTER — Telehealth (INDEPENDENT_AMBULATORY_CARE_PROVIDER_SITE_OTHER): Payer: Self-pay

## 2011-04-21 NOTE — Telephone Encounter (Signed)
Spoke with patient he is to call and schedule post op with Dr. Hoyt Koch. RMP

## 2011-05-13 ENCOUNTER — Encounter: Payer: Self-pay | Admitting: Internal Medicine

## 2011-05-13 ENCOUNTER — Ambulatory Visit (INDEPENDENT_AMBULATORY_CARE_PROVIDER_SITE_OTHER): Payer: Medicare PPO | Admitting: Internal Medicine

## 2011-05-13 VITALS — BP 112/62 | HR 80 | Temp 98.3°F | Ht 70.0 in | Wt >= 6400 oz

## 2011-05-13 DIAGNOSIS — S90416A Abrasion, unspecified lesser toe(s), initial encounter: Secondary | ICD-10-CM

## 2011-05-13 DIAGNOSIS — IMO0002 Reserved for concepts with insufficient information to code with codable children: Secondary | ICD-10-CM

## 2011-05-13 MED ORDER — DOXYCYCLINE HYCLATE 100 MG PO TABS: 100.0000 mg | ORAL_TABLET | Freq: Two times a day (BID) | ORAL | Status: AC

## 2011-05-13 NOTE — Progress Notes (Signed)
  Subjective:    Patient ID: [name], male    DOB: 08-10-1956, 55 y.o.   MRN: [id number]  HPI Mr. Morning presents for evaluation of injury to second toe left foot - scraped on shower. Now  Dark with scant drainage  I have reviewed the patient's medical history in detail and updated the computerized patient record.    Review of Systems System review is negative for any constitutional, cardiac, pulmonary, GI or neuro symptoms or complaints     Objective:   Physical Exam Vitals - he reports that he weighs 432 - an increase and close to his pre-surgical weight. Gen'l - obese AA man in no distress Derm - 2nd toe left foot with small abrasion, no exudate and dark skin discoloration.       Assessment & Plan:  Abrasion toe -   Plan - due to diabetic, compromised state will Rx with doxycycline

## 2011-05-13 NOTE — Patient Instructions (Signed)
Toe injury - looks pretty good but for insurance and safety will treat with doxycycline 100mg  bid x 7

## 2011-05-18 ENCOUNTER — Telehealth: Payer: Self-pay

## 2011-05-18 ENCOUNTER — Telehealth: Payer: Self-pay | Admitting: Internal Medicine

## 2011-05-18 NOTE — Telephone Encounter (Signed)
The pt called and asked that a rx for a walker with wheels [facility]  He asked that we fax the rx to [phone]

## 2011-05-18 NOTE — Telephone Encounter (Signed)
OK for heavy duty rolling walker with 4" wheels. Dx 250.60, 278.00, 425.4

## 2011-05-19 NOTE — Telephone Encounter (Signed)
Faxed order to [phone]

## 2011-05-19 NOTE — Telephone Encounter (Signed)
Faxed

## 2011-05-19 NOTE — Telephone Encounter (Signed)
Ok for rolling walker - see order

## 2011-05-19 NOTE — Telephone Encounter (Signed)
Pending signature

## 2011-05-26 ENCOUNTER — Telehealth: Payer: Self-pay

## 2011-06-23 ENCOUNTER — Encounter: Payer: Self-pay | Admitting: Internal Medicine

## 2011-06-23 ENCOUNTER — Ambulatory Visit (INDEPENDENT_AMBULATORY_CARE_PROVIDER_SITE_OTHER): Payer: Medicare PPO | Admitting: Internal Medicine

## 2011-06-23 ENCOUNTER — Other Ambulatory Visit (INDEPENDENT_AMBULATORY_CARE_PROVIDER_SITE_OTHER): Payer: Medicare PPO

## 2011-06-23 ENCOUNTER — Ambulatory Visit (HOSPITAL_COMMUNITY)
Admission: RE | Admit: 2011-06-23 | Discharge: 2011-06-23 | Disposition: A | Discharge status: Home / Self Care | Payer: Medicare PPO | Source: Ambulatory Visit | Attending: Internal Medicine | Admitting: Internal Medicine

## 2011-06-23 ENCOUNTER — Other Ambulatory Visit: Payer: Medicare PPO

## 2011-06-23 DIAGNOSIS — L02619 Cutaneous abscess of unspecified foot: Secondary | ICD-10-CM

## 2011-06-23 DIAGNOSIS — Z23 Encounter for immunization: Secondary | ICD-10-CM

## 2011-06-23 DIAGNOSIS — E109 Type 1 diabetes mellitus without complications: Secondary | ICD-10-CM

## 2011-06-23 DIAGNOSIS — E785 Hyperlipidemia, unspecified: Secondary | ICD-10-CM

## 2011-06-23 DIAGNOSIS — G5790 Unspecified mononeuropathy of unspecified lower limb: Secondary | ICD-10-CM | POA: Insufficient documentation

## 2011-06-23 DIAGNOSIS — L98499 Non-pressure chronic ulcer of skin of other sites with unspecified severity: Secondary | ICD-10-CM

## 2011-06-23 DIAGNOSIS — M79675 Pain in left toe(s): Secondary | ICD-10-CM

## 2011-06-23 DIAGNOSIS — I10 Essential (primary) hypertension: Secondary | ICD-10-CM

## 2011-06-23 DIAGNOSIS — M79609 Pain in unspecified limb: Secondary | ICD-10-CM

## 2011-06-23 DIAGNOSIS — M7989 Other specified soft tissue disorders: Secondary | ICD-10-CM | POA: Insufficient documentation

## 2011-06-23 LAB — LIPID PANEL
Cholesterol: 159 mg/dL (ref 0–200)
Total CHOL/HDL Ratio: 4
Triglycerides: 107 mg/dL (ref 0.0–149.0)

## 2011-06-23 LAB — CBC WITH DIFFERENTIAL/PLATELET
Basophils Relative: 0.3 % (ref 0.0–3.0)
Eosinophils Absolute: 0.2 10*3/uL (ref 0.0–0.7)
HCT: 31.5 % — ABNORMAL LOW (ref 39.0–52.0)
Hemoglobin: 10.1 g/dL — ABNORMAL LOW (ref 13.0–17.0)
Lymphocytes Relative: 24.1 % (ref 12.0–46.0)
Monocytes Absolute: 0.4 10*3/uL (ref 0.1–1.0)
Monocytes Relative: 7.2 % (ref 3.0–12.0)
RBC: 3.64 Mil/uL — ABNORMAL LOW (ref 4.22–5.81)
RDW: 15 % — ABNORMAL HIGH (ref 11.5–14.6)

## 2011-06-23 LAB — COMPREHENSIVE METABOLIC PANEL
ALT: 13 U/L (ref 0–53)
Alkaline Phosphatase: 92 U/L (ref 39–117)
Sodium: 139 mEq/L (ref 135–145)
Total Bilirubin: 0.4 mg/dL (ref 0.3–1.2)
Total Protein: 7.5 g/dL (ref 6.0–8.3)

## 2011-06-23 LAB — SEDIMENTATION RATE: Sed Rate: 58 mm/hr — ABNORMAL HIGH (ref 0–22)

## 2011-06-23 LAB — URINALYSIS, ROUTINE W REFLEX MICROSCOPIC
Ketones, ur: NEGATIVE
Specific Gravity, Urine: 1.02 (ref 1.000–1.030)
Urine Glucose: 250
pH: 6 (ref 5.0–8.0)

## 2011-06-23 LAB — C-PEPTIDE: C-Peptide: 1.76 ng/mL (ref 0.80–3.90)

## 2011-06-23 MED ORDER — AMOXICILLIN-POT CLAVULANATE 500-125 MG PO TABS: 1.0000 | ORAL_TABLET | Freq: Three times a day (TID) | ORAL | Status: AC

## 2011-06-23 NOTE — Assessment & Plan Note (Signed)
I will check a plain film to look for occult fracture, osteo., djd, etc

## 2011-06-23 NOTE — Assessment & Plan Note (Signed)
I will check his A1C and will monitor his renal function 

## 2011-06-23 NOTE — Patient Instructions (Signed)
Cellulitis Cellulitis is an infection of the skin and the tissue beneath it. The area is typically red and tender. It is caused by germs (bacteria) (usually staph or strep) that enter the body through cuts or sores. Cellulitis most commonly occurs in the arms or lower legs.  HOME CARE INSTRUCTIONS  If you are given a prescription for medications which kill germs (antibiotics), take as directed until finished.   If the infection is on the arm or leg, keep the limb elevated as able.   Use a warm cloth several times per day to relieve pain and encourage healing.   See your caregiver for recheck of the infected site in 7 days or sooner if problems arise.   Only take over-the-counter or prescription medicines for pain, discomfort, or fever as directed by your caregiver.  SEEK MEDICAL CARE IF: An oral temperature above 100.5Diabetes, Type 2 Diabetes is a lasting (chronic) disease. In type 2 diabetes, the pancreas does not make enough insulin (a hormone), and the body does not respond normally to the insulin that is made. This type of diabetes was also previously called adult onset diabetes. About 90% of all those who have diabetes have type 2. It usually occurs after the age of 36 but can occur at any age. CAUSES Unlike type 1 diabetes, which happens because insulin is no longer being made, type 2 diabetes happens because the body is making less insulin and has trouble using the insulin properly. SYMPTOMS Drinking more than usual.  Urinating more than usual.  Blurred vision.  Dry, itchy skin.  Frequent infection like yeast infections in women.  More tired than usual (fatigue).  TREATMENT Healthy eating.  Exercise.  Medication, if needed.  Monitoring blood glucose (sugar).  Seeing your caregiver regularly.  HOME CARE INSTRUCTIONS Check your blood glucose (sugar) at least once daily. More frequent monitoring may be necessary, depending on your medications and on how well your diabetes is  controlled. Your caregiver will advise you.  Take your medicine as directed by your caregiver.  Do not smoke.  Make wise food choices. Ask your caregiver for information. Weight loss can improve your diabetes.  Learn about low blood glucose (hypoglycemia) and how to treat it.  Get your eyes checked regularly.  Have a yearly physical exam. Have your blood pressure checked. Get your blood and urine tested.  Wear a pendant or bracelet saying that you have diabetes.  Check your feet every night for sores. Let your caregiver know if you have sores that are not healing.  SEEK MEDICAL CARE IF: You are having problems keeping your blood glucose at target range.  You feel you might be having problems with your medicines.  You have symptoms of an illness that is not improving after 24 hours.  You have a sore or wound that is not healing.  You notice a change in vision or a new problem with your vision.  You develop a fever of more than 100.5.  Document Released: 09/05/2005 Document Re-Released: 09/27/2009  [facility] Patient Information 2011 [place]. develops, not controlled by medication.   The area of redness (inflammation) is spreading, there are red streaks coming from the infected site, or if a part of the infection begins to turn dark in color.   The joint or bone underneath the infected skin becomes painful after the skin has healed.   The infection returns in the same or another area after it seems to have gone away.   A boil or  bump swells up. This may be an abscess.   New, unexplained problems such as pain or fever develop.  SEEK IMMEDIATE MEDICAL CARE IF:  You or your child feels drowsy or lethargic.   There is vomiting, diarrhea, or lasting discomfort or feeling ill (malaise) with muscle aches and pains.  MAKE SURE YOU:   Understand these instructions.   Will watch your condition.   Will get help right away if you are not doing well or get worse.  Document Released:  06/15/2005 Document Re-Released: 07/03/2009 [facility] Patient Information 2011 [place].

## 2011-06-23 NOTE — Progress Notes (Signed)
Subjective:    Patient ID: [name], male    DOB: November 04, 1955, 55 y.o.   MRN: [id number]  HPI New to me he comes in today complaining of pain over the medial side of his left great toe for 3 days, he has a callous there that he has been trying to whittle away and now it is painful.   Review of Systems  Constitutional: Negative for fever, chills, diaphoresis, activity change, appetite change, fatigue and unexpected weight change.  HENT: Negative.   Eyes: Negative.   Respiratory: Negative for apnea, cough, choking, chest tightness, shortness of breath, wheezing and stridor.   Cardiovascular: Positive for leg swelling (chronic, unchanged). Negative for chest pain and palpitations.  Gastrointestinal: Negative for nausea, vomiting, abdominal pain, diarrhea, constipation, blood in stool, abdominal distention and anal bleeding.  Genitourinary: Negative for dysuria, urgency, frequency, hematuria, flank pain, decreased urine volume, enuresis and difficulty urinating.  Musculoskeletal: Negative for myalgias, back pain, joint swelling and gait problem.  Skin: Positive for wound. Negative for color change, pallor and rash.  Neurological: Negative.   Hematological: Negative for adenopathy. Does not bruise/bleed easily.  Psychiatric/Behavioral: Negative.        Objective:   Physical Exam  Vitals reviewed. Constitutional: He appears well-developed and well-nourished. No distress.  HENT:  Head: Normocephalic and atraumatic.  Mouth/Throat: Oropharynx is clear and moist. No oropharyngeal exudate.  Eyes: Conjunctivae are normal. Right eye exhibits no discharge. Left eye exhibits no discharge. No scleral icterus.  Neck: Normal range of motion. Neck supple. No JVD present. No tracheal deviation present. No thyromegaly present.  Cardiovascular: Normal rate, regular rhythm, normal heart sounds and intact distal pulses.  Exam reveals no gallop and no friction rub.   No murmur heard. Pulses:  Carotid pulses are 1+ on the right side, and 1+ on the left side.      Radial pulses are 1+ on the right side, and 1+ on the left side.       Femoral pulses are 1+ on the right side, and 1+ on the left side.      Popliteal pulses are 1+ on the right side, and 1+ on the left side.       Dorsalis pedis pulses are 1+ on the right side, and 1+ on the left side.       Posterior tibial pulses are 1+ on the right side, and 1+ on the left side.  Pulmonary/Chest: Effort normal and breath sounds normal. No stridor. No respiratory distress. He has no wheezes. He has no rales. He exhibits no tenderness.  Abdominal: Soft. Bowel sounds are normal. He exhibits no distension and no mass. There is no tenderness. There is no rebound and no guarding.  Musculoskeletal: Normal range of motion. He exhibits no tenderness.       Left foot: He exhibits tenderness and deformity. He exhibits normal range of motion, no bony tenderness, no swelling, normal capillary refill and no crepitus.       Feet:       On the medial side of the left foot at the 1st MTP joint there is a large callous that he been partially lifted up at the proximal base and at the sight there is a linear fissure, there is no exudate/warmth/streaking/fluctuance/induration/vesicle  Lymphadenopathy:    He has no cervical adenopathy.  Skin: Skin is warm and dry. No rash noted. He is not diaphoretic. No erythema. No pallor.  Psychiatric: He has a normal mood and affect. His behavior is normal.  Judgment and thought content normal.     Lab Results  Component Value Date   WBC 6.2 09/22/2008   HGB 10.1* 11/09/2009   HCT 32.3* 11/09/2009   PLT 255 11/09/2009   GLUCOSE 239* 05/13/2010   CHOL 151 05/13/2010   TRIG 128.0 05/13/2010   HDL 32.70* 05/13/2010   LDLDIRECT 136.8 12/12/2006   LDLCALC 93 05/13/2010   ALT 20 05/13/2010   AST 20 05/13/2010   NA 137 05/13/2010   K 4.8 05/13/2010   CL 100 05/13/2010   CREATININE 1.2 05/13/2010   BUN 19 05/13/2010   CO2 28  05/13/2010   TSH 0.73 05/13/2010   HGBA1C 9.4* 05/13/2010       Assessment & Plan:

## 2011-06-23 NOTE — Assessment & Plan Note (Signed)
I have asked him to have this evaluated by a podiatrist

## 2011-06-23 NOTE — Assessment & Plan Note (Signed)
His BP is well controlled 

## 2011-06-23 NOTE — Assessment & Plan Note (Signed)
Start antibiotics with augmentin

## 2011-06-24 ENCOUNTER — Encounter: Payer: Self-pay | Admitting: Internal Medicine

## 2011-06-24 ENCOUNTER — Telehealth: Payer: Self-pay | Admitting: *Deleted

## 2011-06-24 LAB — CBC
HCT: 31.4 % — ABNORMAL LOW (ref 39.0–52.0)
Hemoglobin: 10.1 g/dL — ABNORMAL LOW (ref 13.0–17.0)
MCHC: 32.1 g/dL (ref 30.0–36.0)
MCV: 83.4 fL (ref 78.0–100.0)
RBC: 3.76 MIL/uL — ABNORMAL LOW (ref 4.22–5.81)
WBC: 6.8 10*3/uL (ref 4.0–10.5)

## 2011-06-24 LAB — BASIC METABOLIC PANEL
CO2: 25 mEq/L (ref 19–32)
Chloride: 103 mEq/L (ref 96–112)
Creatinine, Ser: 0.96 mg/dL (ref 0.4–1.5)
GFR calc Af Amer: >60 mL/min (ref >60)
Potassium: 3.8 mEq/L (ref 3.5–5.1)
Sodium: 136 mEq/L (ref 135–145)

## 2011-06-24 LAB — URINE MICROSCOPIC-ADD ON

## 2011-06-24 LAB — URINALYSIS, ROUTINE W REFLEX MICROSCOPIC
Bilirubin Urine: NEGATIVE
Glucose, UA: NEGATIVE mg/dL
Hgb urine dipstick: NEGATIVE
Ketones, ur: NEGATIVE mg/dL
Leukocytes, UA: NEGATIVE
Protein, ur: 100 mg/dL — AB
pH: 6 (ref 5.0–8.0)

## 2011-06-24 LAB — HEMOGLOBIN A1C: Hgb A1c MFr Bld: 13.2 % — ABNORMAL HIGH (ref 4.6–6.5)

## 2011-06-24 LAB — GLUCOSE, CAPILLARY: Glucose-Capillary: 175 mg/dL — ABNORMAL HIGH (ref 70–99)

## 2011-06-24 NOTE — Telephone Encounter (Signed)
done

## 2011-06-24 NOTE — Telephone Encounter (Signed)
Patient informed, I advised he go to the ER if patient has severe pain unrelieved by pain meds or fever.

## 2011-06-24 NOTE — Telephone Encounter (Signed)
Patient requesting referral to wound clinic for his toe.

## 2011-06-27 ENCOUNTER — Encounter (HOSPITAL_BASED_OUTPATIENT_CLINIC_OR_DEPARTMENT_OTHER): Payer: Medicare PPO | Attending: Internal Medicine

## 2011-06-27 DIAGNOSIS — E1169 Type 2 diabetes mellitus with other specified complication: Secondary | ICD-10-CM | POA: Insufficient documentation

## 2011-06-27 DIAGNOSIS — L97509 Non-pressure chronic ulcer of other part of unspecified foot with unspecified severity: Secondary | ICD-10-CM | POA: Insufficient documentation

## 2011-06-27 DIAGNOSIS — Z79899 Other long term (current) drug therapy: Secondary | ICD-10-CM | POA: Insufficient documentation

## 2011-06-27 DIAGNOSIS — Z9884 Bariatric surgery status: Secondary | ICD-10-CM | POA: Insufficient documentation

## 2011-06-28 NOTE — Progress Notes (Unsigned)
[facility]  NAME:  [name], [name]               ACCOUNT NO.:  [id number]  MEDICAL RECORD NO.:  [id number]      DATE OF BIRTH:  [birth year]  PHYSICIAN:  Orlando Penner. Sevier, M.D.  VISIT DATE:  06/27/2011                                  OFFICE VISIT   HISTORY:  This is a 55 year old black male with approximately 13 years of type 2 diabetes is seen for an ulceration on his left foot.  The patient has a complex medical history.  He is morbidly obese at one time having reached a weight well in excess of 630 pounds.  He had gastric banding done at [facility] and is now down in the 400 pound range, but still continues to lose he says.  His diabetes management has become easier since that, and his capillary blood sugars generally run from 115-130 he says.  He is unaware of the recent hemoglobin A1c determination. He has been seen in this clinic previously with a diabetic foot ulcer, usually traumatic in nature.  He returns now with a history of having stumped his left hallux in the middle of the night several weeks ago, at which time it bled briefly and then seemed to be okay, but he has noticed subsequently development of a fissure under the proximal phalangeal area of that toe.  There has been a little drainage, no odor, no pain, no fever, no spreading erythema.  He is here now for evaluation and advice.  PAST MEDICAL HISTORY:  Notable for the lap banding as mentioned above, which took place in August 2011.  He has had some bilateral cataract procedures in 1984 done locally.  He has had bilateral pinning of his hips as a child.  He has had no other hospitalizations.  He has no known medicinal allergies.  REGULAR MEDICATIONS: 1. Bisoprolol 10 mg daily. 2. Lasix 40 mg b.i.d. 3. Hydrocodone 10/650 when needed. 4. Levemir insulin 30 units twice daily. 5. Zestril 20 mg daily. 6. Glucophage 1000 mg b.i.d. 7. Zocor 40 mg daily. 8. Augmentin 500/125 three times  a day, which was begun recently.  FAMILY HISTORY:  Not known in detail, apparently is positive for both obesity and type 2 diabetes, as well as hypertension.  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  He is single, not able to take care of his own needs.  He does not smoke, use alcohol or recreational drugs.  REVIEW OF SYSTEMS: Largely negative at the moment except for those things related to his diabetes as previously indicated.  PHYSICAL EXAMINATION:  VITAL SIGNS:  Blood pressure is 126/78, pulse 52 and regular, respirations 18 and temperature 98.  Random capillary blood sugar 140 mg/dL. GENERAL:  He is morbidly obese, but pleasant, cooperative, middle-aged black male in no immediate distress. HEENT:  Mucous membranes are moist and pink.  Oral cavity is unremarkable except for incredibly carious teeth. NECK:  Reasonably supple. CHEST:  Grossly clear to auscultation and percussion. HEART:  Tones are distant, but seemed to be adequate and without definite murmur. ABDOMEN:  Protuberant, soft, nontender, without masses. EXTREMITIES:  Somewhat obese, but not edematous.  His distal pulses are palpable.  He has good capillary filling in both feet.  The right  foot lacks any significant lesions at this time. On the left foot, there is a fissure-like ulcer underlying the plantar crease at the base of the hallux, and this can be seen to be a dissecting up beneath a DIP medial callus.  IMPRESSION:  Diabetic foot ulcer, Wagner 2 left hallux.  DISPOSITION:  The wound is debrided, and this includes taking off the callus and unroofing the ulcer that extends from the fissured area up to underlie the distal MP medial callus.  Following this, the wound is treated with an application of silver collagen and a bulky dressing.  It is discussed with the patient that we prefer to treat him with a total contact cast, and he is amenable to this.  He  will return in 4 days for re-dressing of the wound and for placement of the total contact cast.  Follow up visit will be with this physician in 1 week.          ______________________________ Orlando Penner. London Pepper, M.D.     RES/MEDQ  D:  06/27/2011  T:  06/28/2011  Job:  [id number]

## 2011-07-18 ENCOUNTER — Ambulatory Visit (INDEPENDENT_AMBULATORY_CARE_PROVIDER_SITE_OTHER): Payer: Medicare PPO | Admitting: Internal Medicine

## 2011-07-18 VITALS — BP 132/70 | HR 60 | Temp 97.6°F | Wt >= 6400 oz

## 2011-07-18 DIAGNOSIS — IMO0002 Reserved for concepts with insufficient information to code with codable children: Secondary | ICD-10-CM

## 2011-07-18 DIAGNOSIS — S63502A Unspecified sprain of left wrist, initial encounter: Secondary | ICD-10-CM

## 2011-07-18 NOTE — Progress Notes (Signed)
  Subjective:    Patient ID: [name], male    DOB: Jul 04, 1956, 55 y.o.   MRN: [id number]  HPI [name] had a fall in his house and broke the fall by extending the left arm. Subsequently he has had pain in the left forearm. He is otherwise stable.  \I have reviewed the patient's medical history in detail and updated the computerized patient record.    Review of Systems System review is negative for any constitutional, cardiac, pulmonary, GI or neuro symptoms or complaints other than as described in the HPI.     Objective:   Physical Exam Vitals noted - obese, BP ok Gen'l- obese AA man in no distress PUlm- normal respirations Cor - 2+ radial , RRR Ext - tender at left forearm, tender with supination against resistance        Assessment & Plan:  Sprain left forearm.  Plan - heat, ROM exercise, continue present meds.

## 2011-07-27 ENCOUNTER — Other Ambulatory Visit: Payer: Self-pay | Admitting: *Deleted

## 2011-07-27 MED ORDER — LISINOPRIL 20 MG PO TABS: 20.0000 mg | ORAL_TABLET | Freq: Every day | ORAL | Status: DC

## 2011-08-01 ENCOUNTER — Encounter (HOSPITAL_BASED_OUTPATIENT_CLINIC_OR_DEPARTMENT_OTHER): Payer: Medicare PPO | Attending: Internal Medicine

## 2011-08-01 ENCOUNTER — Other Ambulatory Visit: Payer: Self-pay | Admitting: *Deleted

## 2011-08-01 ENCOUNTER — Telehealth: Payer: Self-pay | Admitting: Internal Medicine

## 2011-08-01 DIAGNOSIS — L97509 Non-pressure chronic ulcer of other part of unspecified foot with unspecified severity: Secondary | ICD-10-CM | POA: Insufficient documentation

## 2011-08-01 DIAGNOSIS — Z79899 Other long term (current) drug therapy: Secondary | ICD-10-CM | POA: Insufficient documentation

## 2011-08-01 DIAGNOSIS — E1169 Type 2 diabetes mellitus with other specified complication: Secondary | ICD-10-CM | POA: Insufficient documentation

## 2011-08-01 DIAGNOSIS — Z9884 Bariatric surgery status: Secondary | ICD-10-CM | POA: Insufficient documentation

## 2011-08-01 MED ORDER — FUROSEMIDE 40 MG PO TABS: 40.0000 mg | ORAL_TABLET | Freq: Two times a day (BID) | ORAL | Status: DC

## 2011-08-01 MED ORDER — SIMVASTATIN 40 MG PO TABS: 40.0000 mg | ORAL_TABLET | Freq: Every day | ORAL | Status: DC

## 2011-08-01 MED ORDER — INSULIN DETEMIR 100 UNIT/ML ~~LOC~~ SOLN: 30.0000 [IU] | Freq: Two times a day (BID) | SUBCUTANEOUS | Status: DC

## 2011-08-01 MED ORDER — METFORMIN HCL 1000 MG PO TABS: 1000.0000 mg | ORAL_TABLET | Freq: Two times a day (BID) | ORAL | Status: DC

## 2011-08-01 MED ORDER — LISINOPRIL 20 MG PO TABS: 20.0000 mg | ORAL_TABLET | Freq: Every day | ORAL | Status: DC

## 2011-08-01 NOTE — Telephone Encounter (Signed)
The pt called and is requesting a refill of Bisoprolol Fumarate PO   Thanks!

## 2011-08-03 ENCOUNTER — Other Ambulatory Visit: Payer: Self-pay | Admitting: *Deleted

## 2011-08-03 MED ORDER — BISOPROLOL FUMARATE 10 MG PO TABS: 10.0000 mg | ORAL_TABLET | Freq: Every day | ORAL | Status: DC

## 2011-08-29 ENCOUNTER — Telehealth: Payer: Self-pay | Admitting: *Deleted

## 2011-08-29 NOTE — Telephone Encounter (Signed)
Refill request for Hydrocodone 10/650 fax from [facility] take one tablet by mouth every 6 hours prn for pain

## 2011-08-29 NOTE — Telephone Encounter (Signed)
Ok for refill  x3 

## 2011-08-30 MED ORDER — HYDROCODONE-ACETAMINOPHEN 10-650 MG PO TABS: 1.0000 | ORAL_TABLET | Freq: Four times a day (QID) | ORAL | Status: DC | PRN

## 2011-08-30 NOTE — Telephone Encounter (Signed)
Rx called in to pharmacy. 

## 2011-10-10 ENCOUNTER — Ambulatory Visit (INDEPENDENT_AMBULATORY_CARE_PROVIDER_SITE_OTHER): Payer: Medicare PPO | Admitting: Internal Medicine

## 2011-10-10 ENCOUNTER — Encounter: Payer: Self-pay | Admitting: Internal Medicine

## 2011-10-10 DIAGNOSIS — M7710 Lateral epicondylitis, unspecified elbow: Secondary | ICD-10-CM

## 2011-10-10 DIAGNOSIS — L989 Disorder of the skin and subcutaneous tissue, unspecified: Secondary | ICD-10-CM

## 2011-10-10 NOTE — Progress Notes (Signed)
  Subjective:    Patient ID: [name], male    DOB: 1956-08-08, 56 y.o.   MRN: [id number]  HPI [name] presents for evaluation of several very small "bumps" in his skin: right lateral thigh, left forearm volar and dorsal surface. These are not painful, do not drain or itch. He continues to have pain in the right forearm which started in October '12. He has been doing range of motion and has been using heat.  I have reviewed the patient's medical history in detail and updated the computerized patient record.    Review of Systems System review is negative for any constitutional, cardiac, pulmonary, GI or neuro symptoms or complaints other than as described in the HPI.     Objective:   Physical Exam Filed Vitals:   10/10/11 1402  BP: 150/72  Pulse: 76  Temp: 97.9 F (36.6 C)  Resp: 16   Gen'l- obese AA man in no distress Puolm - normal respirations Cor - RRR Derm - tiny 2 mm subcutaneous nodules thigh and forearm left. MSK - tender to palpation of the forearm muscles and tendons right arm, especially tender at lateral epicondyle.       Assessment & Plan:  Skin bumps - unremarkable exam and small/tiny lesions do not look pathologic  Lateral epicondylitis - typical pain distribution. Plan - continue heat, massage and NSAIDs.

## 2011-10-26 ENCOUNTER — Other Ambulatory Visit: Payer: Self-pay | Admitting: *Deleted

## 2011-10-26 MED ORDER — SIMVASTATIN 40 MG PO TABS: 40.0000 mg | ORAL_TABLET | Freq: Every day | ORAL | Status: DC

## 2011-12-08 ENCOUNTER — Ambulatory Visit (INDEPENDENT_AMBULATORY_CARE_PROVIDER_SITE_OTHER): Payer: Medicare PPO | Admitting: Internal Medicine

## 2011-12-08 ENCOUNTER — Encounter: Payer: Self-pay | Admitting: Internal Medicine

## 2011-12-08 VITALS — BP 148/76 | HR 95 | Temp 97.9°F | Ht 72.0 in | Wt >= 6400 oz

## 2011-12-08 DIAGNOSIS — L98499 Non-pressure chronic ulcer of skin of other sites with unspecified severity: Secondary | ICD-10-CM

## 2011-12-08 DIAGNOSIS — I428 Other cardiomyopathies: Secondary | ICD-10-CM

## 2011-12-08 DIAGNOSIS — E785 Hyperlipidemia, unspecified: Secondary | ICD-10-CM

## 2011-12-08 DIAGNOSIS — E109 Type 1 diabetes mellitus without complications: Secondary | ICD-10-CM

## 2011-12-08 DIAGNOSIS — E669 Obesity, unspecified: Secondary | ICD-10-CM

## 2011-12-08 MED ORDER — INSULIN DETEMIR 100 UNIT/ML ~~LOC~~ SOLN: 30.0000 [IU] | Freq: Two times a day (BID) | SUBCUTANEOUS | Status: DC

## 2011-12-08 MED ORDER — AMOXICILLIN 875 MG PO TABS: 875.0000 mg | ORAL_TABLET | Freq: Two times a day (BID) | ORAL | Status: AC

## 2011-12-08 NOTE — Patient Instructions (Signed)
Upper respiratory infection: plan - amoxicillin 875 mg twice a day until the Rx is completed; sudafed 30 mg two or three times a day for congestion; gargle of choice.  For foot callus with ulcer - will refer to wound center.   Upper Respiratory Infection, Adult An upper respiratory infection (URI) is also sometimes known as the common cold. The upper respiratory tract includes the nose, sinuses, throat, trachea, and bronchi. Bronchi are the airways leading to the lungs. Most people improve within 1 week, but symptoms can last up to 2 weeks. A residual cough may last even longer.   CAUSES Many different viruses can infect the tissues lining the upper respiratory tract. The tissues become irritated and inflamed and often become very moist. Mucus production is also common. A cold is contagious. You can easily spread the virus to others by oral contact. This includes kissing, sharing a glass, coughing, or sneezing. Touching your mouth or nose and then touching a surface, which is then touched by another person, can also spread the virus. SYMPTOMS   Symptoms typically develop 1 to 3 days after you come in contact with a cold virus. Symptoms vary from person to person. They may include:  Runny nose.   Sneezing.   Nasal congestion.   Sinus irritation.   Sore throat.   Loss of voice (laryngitis).   Cough.   Fatigue.   Muscle aches.   Loss of appetite.   Headache.   Low-grade fever.  DIAGNOSIS   You might diagnose your own cold based on familiar symptoms, since most people get a cold 2 to 3 times a year. Your caregiver can confirm this based on your exam. Most importantly, your caregiver can check that your symptoms are not due to another disease such as strep throat, sinusitis, pneumonia, asthma, or epiglottitis. Blood tests, throat tests, and X-rays are not necessary to diagnose a common cold, but they may sometimes be helpful in excluding other more serious diseases. Your caregiver will  decide if any further tests are required. RISKS AND COMPLICATIONS   You may be at risk for a more severe case of the common cold if you smoke cigarettes, have chronic heart disease (such as heart failure) or lung disease (such as asthma), or if you have a weakened immune system. The very young and very old are also at risk for more serious infections. Bacterial sinusitis, middle ear infections, and bacterial pneumonia can complicate the common cold. The common cold can worsen asthma and chronic obstructive pulmonary disease (COPD). Sometimes, these complications can require emergency medical care and may be life-threatening. PREVENTION   The best way to protect against getting a cold is to practice good hygiene. Avoid oral or hand contact with people with cold symptoms. Wash your hands often if contact occurs. There is no clear evidence that vitamin C, vitamin E, echinacea, or exercise reduces the chance of developing a cold. However, it is always recommended to get plenty of rest and practice good nutrition. TREATMENT   Treatment is directed at relieving symptoms. There is no cure. Antibiotics are not effective, because the infection is caused by a virus, not by bacteria. Treatment may include:  Increased fluid intake. Sports drinks offer valuable electrolytes, sugars, and fluids.   Breathing heated mist or steam (vaporizer or shower).   Eating chicken soup or other clear broths, and maintaining good nutrition.   Getting plenty of rest.   Using gargles or lozenges for comfort.   Controlling fevers with ibuprofen or  acetaminophen as directed by your caregiver.   Increasing usage of your inhaler if you have asthma.  Zinc gel and zinc lozenges, taken in the first 24 hours of the common cold, can shorten the duration and lessen the severity of symptoms. Pain medicines may help with fever, muscle aches, and throat pain. A variety of non-prescription medicines are available to treat congestion and  runny nose. Your caregiver can make recommendations and may suggest nasal or lung inhalers for other symptoms.   HOME CARE INSTRUCTIONS    Only take over-the-counter or prescription medicines for pain, discomfort, or fever as directed by your caregiver.   Use a warm mist humidifier or inhale steam from a shower to increase air moisture. This may keep secretions moist and make it easier to breathe.   Drink enough water and fluids to keep your urine clear or pale yellow.   Rest as needed.   Return to work when your temperature has returned to normal or as your caregiver advises. You may need to stay home longer to avoid infecting others. You can also use a face mask and careful hand washing to prevent spread of the virus.  SEEK MEDICAL CARE IF:    After the first few days, you feel you are getting worse rather than better.   You need your caregiver's advice about medicines to control symptoms.   You develop chills, worsening shortness of breath, or brown or red sputum. These may be signs of pneumonia.   You develop yellow or brown nasal discharge or pain in the face, especially when you bend forward. These may be signs of sinusitis.   You develop a fever, swollen neck glands, pain with swallowing, or white areas in the back of your throat. These may be signs of strep throat.  SEEK IMMEDIATE MEDICAL CARE IF:    You have a fever.   You develop severe or persistent headache, ear pain, sinus pain, or chest pain.   You develop wheezing, a prolonged cough, cough up blood, or have a change in your usual mucus (if you have chronic lung disease).   You develop sore muscles or a stiff neck.  Document Released: 03/01/2001 Document Revised: 08/25/2011 Document Reviewed: 01/07/2011 [facility] Patient Information 2012 [place].

## 2011-12-08 NOTE — Progress Notes (Signed)
  Subjective:    Patient ID: [name], male    DOB: 09-06-1956, 56 y.o.   MRN: [id number]  HPI [name] presents for a 3 days h/o URI symptoms with pressure in the sinus, sore throat, cough. No fever or SOB. No wheezing.   His callus on the left great toe is starting to drain, but it isn't painful. He has a h/o diabetic related foot ulcer. He is at high risk due to DM, obesity and venous stasis.  Past Medical History  Diagnosis Date  . Diabetes mellitus     Has Hx of diabetic foot ulcer & peripheral neuropathy  . Hyperlipidemia   . HTN (hypertension)   . Colon cancer 2007    s/p sigmoid colectomy  . Morbid obesity   . Anemia   . OSA on CPAP   . CHF (congestive heart failure)     Presumed diastolic. Echo (06/07) w.EF 45%, severe posterior HK, mild LV hypertrophy, No further work-up of abnormal echo was done.  Marland Kitchen History of PFTs 05/2009    Mild Obstructive defect   Past Surgical History  Procedure Date  . Sigmoid colectomy 10/2005    [name]  . Corneal transplant [name]    '80/Right  '84/Left  . Bariatric surgery 12/2009    Lap Band/At [facility]   Family History  Problem Relation Age of Onset  . Hepatitis Mother     hepatitis C  . Heart attack Brother 45  . Cancer Neg Hx     colon or prostate   History   Social History  . Marital Status: Legally Separated    Spouse Name: N/A    Number of Children: N/A  . 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 Smoking status: Never Smoker   . Smokeless tobacco: Not on file  . Alcohol Use: No     Only Occasional.  . Drug Use: No  . Sexually Active: Not Currently   Other Topics Concern  . 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     Objective:   Physical Exam Filed Vitals:   12/08/11 1457  BP: 148/76  Pulse: 95  Temp: 97.9 F (36.6 C)   Wt Readings from Last 3 Encounters:  12/08/11 408 lb 12.8 oz (185.43 kg)  07/18/11 438 lb (198.675 kg)  06/23/11 436 lb (197.768 kg)   Gen'l- obese AA man in no acute distress HEENT- mild tenderness to percussion over the frontal sinus. Cor - RRR Pulm - normal respirations, no wheezing, no increased WOB Ext- chronic venous insufficience Derm - thick plantar callus 1st toe left foot with ulcerated center with clear drainage.         Assessment & Plan:  URI - mild URI/sinusitis  Plan- Amoxicillin 875 mg BID x 7          Supportive care.

## 2011-12-10 NOTE — Assessment & Plan Note (Signed)
Very thick callus with central erosion with serosanguinous drainage.  Plan - refer to wound center for care.

## 2011-12-10 NOTE — Assessment & Plan Note (Signed)
Lab Results  Component Value Date   CHOL 159 06/23/2011   HDL 42.20 06/23/2011   LDLCALC 95 06/23/2011   LDLDIRECT 136.8 12/12/2006   TRIG 107.0 06/23/2011   CHOLHDL 4 06/23/2011   Due for follow-up lab with recommendations to follow. He is currently on simvastatin.

## 2011-12-10 NOTE — Assessment & Plan Note (Signed)
Patient is almost 2 years out from lap band surgery with a total weight loss of 120 lbs. He remains morbidly obese.  Plan - continued weight loss efforts.

## 2011-12-10 NOTE — Assessment & Plan Note (Signed)
Lab Results  Component Value Date   HGBA1C 13.2* 06/23/2011   Presently on basal insulin therapy and metformin.  Plan - follow-up A1C - if poorly controlled will add DDP4 vs GLP-1

## 2011-12-10 NOTE — Assessment & Plan Note (Signed)
Patient is current with cardiology follow-up. His last EF was improved.  Plan - continue present medications.           Continue risk modification efforts.

## 2011-12-12 ENCOUNTER — Encounter (HOSPITAL_BASED_OUTPATIENT_CLINIC_OR_DEPARTMENT_OTHER): Payer: Medicare PPO | Attending: Internal Medicine

## 2011-12-12 DIAGNOSIS — Z85038 Personal history of other malignant neoplasm of large intestine: Secondary | ICD-10-CM | POA: Insufficient documentation

## 2011-12-12 DIAGNOSIS — Z79899 Other long term (current) drug therapy: Secondary | ICD-10-CM | POA: Insufficient documentation

## 2011-12-12 DIAGNOSIS — L97509 Non-pressure chronic ulcer of other part of unspecified foot with unspecified severity: Secondary | ICD-10-CM | POA: Insufficient documentation

## 2011-12-12 DIAGNOSIS — E1169 Type 2 diabetes mellitus with other specified complication: Secondary | ICD-10-CM | POA: Insufficient documentation

## 2011-12-13 NOTE — Progress Notes (Signed)
[facility]  NAME:  [name], [name]               ACCOUNT NO.:  [id number]  MEDICAL RECORD NO.:  [id number]      DATE OF BIRTH:  13-Oct-1955  PHYSICIAN:  Orlando Penner. Kasaundra Fahrney, M.D.  VISIT DATE:  12/12/2011                                  OFFICE VISIT   This 56 year old black male has been a previous patient here with diabetic foot ulcer, which has been healed with casting in the past.  He noted approximately 2 weeks ago the recurrence of some drainage and chronic callused area that he has at the site of a previous ulcer at the interphalangeal joint aspect of the left great toe.  This has begun to drain and when he saw Dr. Linda Hedges, his primary physician, he was referred here for our further evaluation and treatment.  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  He states that he is essentially well today except for the draining of this toe and knowing how potentially hazardous this could be, he wanted early attention for it (appropriately soaked).  REGULAR MEDICATIONS: 1. Levemir 30 units twice daily. 2. Metformin a 1000 mg twice daily. 3. Bisoprolol 10 mg daily. 4. Lisinopril 10 mg daily. 5. Furosemide 40 mg daily. 6. Simvastatin 40 mg daily. 7. Amoxicillin/clavulanate 875 mg b.i.d. 8. Hydrocodone/acetaminophen 10/650 q.4 h. p.r.n.  REVIEW OF SYSTEMS:  Aside from those things indicated above.  The patient in the past had history of colon cancer, which apparently has shown no evidence of recurrence.  He is said incidentally to be allergic to [facility], which  cause anaphylaxis, but not to any medications.  PHYSICAL EXAMINATION:  VITAL SIGNS:  Blood pressure is 149/80, pulse is 67 and regular respirations 16, temperature is 98.4, random blood glucose 117 mg per dL.  His height is 6 feet.  Weight 408 pounds.  He is cooperative, but tends to drift into apneic bouts of sleep when not kept stimulated by the examiner.  He is in no apparent distress. GENERAL:  Not accomplished at this time having recently been done in detail by Dr. Linda Hedges. EXTREMITIES:  His right lower extremity shows no open lesions.  His left lower extremity is chronically edematous, approximately 2+ with pulses palpable at the dorsalis pedis and posterior tibial areas. Incidentally, his ABI on the left lower extremity is 1.3.  On the medial aspect of the left hallux at the interphalangeal joint area is a crusty callused area with seeping.  IMPRESSION:  Diabetic foot ulcer, likely Wagner II, left hallux.  DISPOSITION:  The wound is debrided with removal of the callus and indeed an area of open ulceration is discovered beneath this and is opened more widely.  I found a small fat pad with a crevice at its plantar aspect suggesting that this wound may never have completely healed.  I have debrided the fat pad away and did not feel if there is any penetration of this wound to bone.  Following this debridement, the  wound was somewhat larger, now measuring approximately 1.6 x 0.7 cm, and approximately 0.5 cm in depth.  It is treated with an application of silver collagen and a foam dressing is taped in place.  He is placed in a weighted shoe issue with felt padding of adequate thickness placed behind his toe in approximately the metatarsal head areas, so as to hopefully to better offload this toe.  Followup visit will be in 1 week.          ______________________________ Orlando Penner. London Pepper, M.D.     RES/MEDQ  D:  12/12/2011  T:  12/13/2011  Job:  [id number]

## 2011-12-15 ENCOUNTER — Ambulatory Visit (INDEPENDENT_AMBULATORY_CARE_PROVIDER_SITE_OTHER): Payer: Medicare PPO | Admitting: Internal Medicine

## 2011-12-15 ENCOUNTER — Encounter: Payer: Self-pay | Admitting: Internal Medicine

## 2011-12-15 VITALS — BP 116/70 | HR 71 | Temp 97.7°F | Resp 18 | Wt >= 6400 oz

## 2011-12-15 DIAGNOSIS — L98499 Non-pressure chronic ulcer of skin of other sites with unspecified severity: Secondary | ICD-10-CM

## 2011-12-15 NOTE — Progress Notes (Signed)
  Subjective:    Patient ID: [name], male    DOB: 1956-09-15, 56 y.o.   MRN: [id number]  HPI Mr. Schwanke presents for follow up of his left great toe ulcer. He is being followed at the wound center. 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 He is requesting a letter so he can withdraw w/o penalty.   Review of Systems     Objective:   Physical Exam        Assessment & Plan:

## 2011-12-15 NOTE — Assessment & Plan Note (Signed)
Provided school note - see letter.

## 2011-12-19 ENCOUNTER — Encounter (HOSPITAL_BASED_OUTPATIENT_CLINIC_OR_DEPARTMENT_OTHER): Payer: Medicare PPO | Attending: Internal Medicine

## 2011-12-19 DIAGNOSIS — L97509 Non-pressure chronic ulcer of other part of unspecified foot with unspecified severity: Secondary | ICD-10-CM | POA: Insufficient documentation

## 2011-12-19 DIAGNOSIS — E1169 Type 2 diabetes mellitus with other specified complication: Secondary | ICD-10-CM | POA: Insufficient documentation

## 2012-01-09 ENCOUNTER — Encounter (HOSPITAL_BASED_OUTPATIENT_CLINIC_OR_DEPARTMENT_OTHER): Payer: Medicare PPO

## 2012-01-13 ENCOUNTER — Other Ambulatory Visit: Payer: Self-pay | Admitting: *Deleted

## 2012-01-13 MED ORDER — BISOPROLOL FUMARATE 10 MG PO TABS: 10.0000 mg | ORAL_TABLET | Freq: Every day | ORAL | Status: DC

## 2012-01-13 MED ORDER — POLYETHYLENE GLYCOL 3350 17 GM/SCOOP PO POWD: 17.0000 g | Freq: Every day | ORAL | Status: DC

## 2012-01-13 NOTE — Telephone Encounter (Signed)
REFILL FOR BISOPROLOL 10MG . SUE

## 2012-01-13 NOTE — Telephone Encounter (Signed)
REFILL SENT FOR POLYETHYLEHE GLYCOL TO [facility]. SUE.

## 2012-02-14 ENCOUNTER — Other Ambulatory Visit: Payer: Self-pay | Admitting: Internal Medicine

## 2012-02-17 ENCOUNTER — Telehealth: Payer: Self-pay | Admitting: *Deleted

## 2012-02-17 MED ORDER — HYDROCODONE-ACETAMINOPHEN 10-650 MG PO TABS: 1.0000 | ORAL_TABLET | Freq: Four times a day (QID) | ORAL | Status: DC | PRN

## 2012-02-17 NOTE — Telephone Encounter (Signed)
[facility] for limited refill now, further refills per PCP

## 2012-02-17 NOTE — Telephone Encounter (Signed)
Faxed hardcopy to pharmacy. Called the patient left detailed message of all information.

## 2012-02-17 NOTE — Telephone Encounter (Signed)
PATIENT REQUEST REFILL ON  HYDROCODONE/APAP 10/650. PLEASE ADVISE, DR. Linda Hedges PATIENT.

## 2012-02-23 ENCOUNTER — Telehealth: Payer: Self-pay | Admitting: Cardiology

## 2012-02-23 NOTE — Telephone Encounter (Signed)
New Problem:    I called the patient and was unable to reach them or leave a message.  Patient's number just rang continuously.

## 2012-02-27 ENCOUNTER — Ambulatory Visit (INDEPENDENT_AMBULATORY_CARE_PROVIDER_SITE_OTHER): Payer: Medicare PPO | Admitting: Internal Medicine

## 2012-02-27 ENCOUNTER — Encounter: Payer: Self-pay | Admitting: Internal Medicine

## 2012-02-27 VITALS — BP 130/78 | HR 75 | Temp 97.1°F | Resp 16 | Wt >= 6400 oz

## 2012-02-27 DIAGNOSIS — H6980 Other specified disorders of Eustachian tube, unspecified ear: Secondary | ICD-10-CM

## 2012-02-27 MED ORDER — INSULIN DETEMIR 100 UNIT/ML ~~LOC~~ SOLN: 30.0000 [IU] | Freq: Two times a day (BID) | SUBCUTANEOUS | Status: DC

## 2012-02-27 MED ORDER — METFORMIN HCL 1000 MG PO TABS: 1000.0000 mg | ORAL_TABLET | Freq: Two times a day (BID) | ORAL | Status: DC

## 2012-02-27 MED ORDER — SIMVASTATIN 40 MG PO TABS: 40.0000 mg | ORAL_TABLET | Freq: Every day | ORAL | Status: DC

## 2012-02-27 MED ORDER — POLYETHYLENE GLYCOL 3350 17 GM/SCOOP PO POWD: 17.0000 g | Freq: Every day | ORAL | Status: DC

## 2012-02-27 MED ORDER — LISINOPRIL 20 MG PO TABS: 20.0000 mg | ORAL_TABLET | Freq: Every day | ORAL | Status: DC

## 2012-02-27 MED ORDER — BISOPROLOL FUMARATE 10 MG PO TABS: 10.0000 mg | ORAL_TABLET | Freq: Every day | ORAL | Status: DC

## 2012-02-27 MED ORDER — FUROSEMIDE 40 MG PO TABS: 40.0000 mg | ORAL_TABLET | Freq: Two times a day (BID) | ORAL | Status: DC

## 2012-02-28 NOTE — Progress Notes (Signed)
  Subjective:    Patient ID: [name], male    DOB: [birth year], 56 y.o.   MRN: [id number]  HPI Mr. Kotila presents for dampened hearing and a sensation of fullness in his ears along with minor tinnitus.  His foot wound has healed.  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  PMH, FamHx and SocHx reviewed for any changes and relevance.    Review of Systems System review is negative for any constitutional, cardiac, pulmonary, GI or neuro symptoms or complaints other than as described in the HPI.     Objective:   Physical Exam Filed Vitals:   02/27/12 1528  BP: 130/78  Pulse: 75  Temp: 97.1 F (36.2 C)  Resp: 16   Wt Readings from Last 3 Encounters:  02/27/12 422 lb (191.418 kg)  12/15/11 408 lb (185.068 kg)  12/08/11 408 lb 12.8 oz (185.43 kg)   Gen'l - obese AA man in no distress HEENT- EAC without impaction. TMs appear normal. Cor- RRR Pulm - normal respirations       Assessment & Plan:  Eustachian tube dysfunction  Plan- otc sudafed 30 mg tid.

## 2012-04-03 ENCOUNTER — Ambulatory Visit: Payer: Medicare PPO | Admitting: Internal Medicine

## 2012-04-04 NOTE — Telephone Encounter (Signed)
Documentation

## 2012-04-12 ENCOUNTER — Encounter: Payer: Self-pay | Admitting: Cardiology

## 2012-04-12 ENCOUNTER — Ambulatory Visit (INDEPENDENT_AMBULATORY_CARE_PROVIDER_SITE_OTHER): Payer: Medicare Other | Admitting: Cardiology

## 2012-04-12 VITALS — BP 144/82 | HR 61

## 2012-04-12 DIAGNOSIS — I10 Essential (primary) hypertension: Secondary | ICD-10-CM

## 2012-04-12 DIAGNOSIS — I428 Other cardiomyopathies: Secondary | ICD-10-CM

## 2012-04-12 DIAGNOSIS — I509 Heart failure, unspecified: Secondary | ICD-10-CM

## 2012-04-12 DIAGNOSIS — I5032 Chronic diastolic (congestive) heart failure: Secondary | ICD-10-CM

## 2012-04-12 LAB — HEPATIC FUNCTION PANEL
Bilirubin, Direct: 0 mg/dL (ref 0.0–0.3)
Total Protein: 7.4 g/dL (ref 6.0–8.3)

## 2012-04-12 LAB — BASIC METABOLIC PANEL
CO2: 29 mEq/L (ref 19–32)
Calcium: 8.9 mg/dL (ref 8.4–10.5)
Creatinine, Ser: 1.1 mg/dL (ref 0.4–1.5)
Potassium: 4.5 mEq/L (ref 3.5–5.1)
Sodium: 138 mEq/L (ref 135–145)

## 2012-04-12 LAB — LIPID PANEL
Cholesterol: 167 mg/dL (ref 0–200)
VLDL: 18.6 mg/dL (ref 0.0–40.0)

## 2012-04-12 NOTE — Patient Instructions (Addendum)
Your physician recommends that you have a FASTING lipid profile /liver profile/BMET.  Your physician recommends that you schedule a follow-up appointment as needed with Dr Aundra Dubin.

## 2012-04-13 DIAGNOSIS — I5032 Chronic diastolic (congestive) heart failure: Secondary | ICD-10-CM | POA: Insufficient documentation

## 2012-04-13 NOTE — Progress Notes (Signed)
Patient ID: [name], male   DOB: [birth year], 56 y.o.   MRN: [id number] PCP: Dr. Linda Hedges  56 yo with history of morbid obesity, diabetes, diastolic CHF presents for cardiology followup.  Weight is down about 2 lbs since last appointment.  He has not lost as much weight as hoped after his lap banding procedure.  He has been doing well symptomatically.  He is more mobile and walking more with his walker rather than using his scooter.  He is doing water aerobics.  Occasional mild atypical right-sided sharp chest pain.  No exertional chest symptoms.    Labs (8/10): creatinine 1.1, LDL 94, HDL 34 Labs (8/11): K 4.8, creatinine 1.2, LDL 93, HDL 33 Labs (10/12): K 5.4, creatinine 1.5, LDL 95, HDL 42  ECG:  NSR, normal  Allergies (verified):  1)  ! * Shellfish 2)  * Shellfish  Past Medical History: 1. Diabetes mellitus.  Has history of diabetic foot ulcer and diabetic peripheral neuropathy. .  2. Hyperlipidemia 3. Hypertension 4. Colon cancer: s/p sigmoid colectomy in 2007 5. Morbid obesity s/p lap band procedure.  6. Anemia 7. Obstructive Sleep Apnea, CPAP 8. CHF: Diastolic.  Echo (5/12): EF 50-55%, mild LV dilation, mild LVH, moderate (grade II) diastolic dysfunction.  9. PFTs (9/10): mild obstructive defect 10.  Dobutamine stress echo (9/10): EF 55%, mild LV hypertrophy, no significant valvular disease, no ischemia or infarction   Family History: father- deceased @54 : Asbestosis mother - deceased @ 36: CHF, CAD, Hepatitis C Neg- colon or prostate cancer. Brother - died of heart failure and had MI at 43  Social History: HSG, 2 years of college [place] of [place]. 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 In new relationship (04/2009)...girlfriend helps taste for cooking business and helps monitor for ulcers.   Never Smoked Alcohol use-occasional.     Drug use-no Regular exercise-no   Current Outpatient Prescriptions  Medication Sig Dispense Refill  . bisoprolol (ZEBETA) 10 MG tablet Take 1 tablet (10 mg total) by mouth daily.  90 tablet  3  . furosemide (LASIX) 40 MG tablet Take 1 tablet (40 mg total) by mouth 2 (two) times daily.  180 tablet  3  . HYDROcodone-acetaminophen (LORCET) 10-650 MG per tablet Take 1 tablet by mouth every 6 (six) hours as needed.  120 tablet  0  . insulin detemir (LEVEMIR FLEXPEN) 100 UNIT/ML injection Inject 30 Units into the skin 2 (two) times daily.  15 mL  11  . lisinopril (PRINIVIL,ZESTRIL) 20 MG tablet Take 1 tablet (20 mg total) by mouth daily.  90 tablet  3  . metFORMIN (GLUCOPHAGE) 1000 MG tablet Take 1 tablet (1,000 mg total) by mouth 2 (two) times daily with a meal.  180 tablet  3  . polyethylene glycol powder (GLYCOLAX/MIRALAX) powder Take 17 g by mouth daily. Mix1 packet daily as needed      . simvastatin (ZOCOR) 40 MG tablet Take 1 tablet (40 mg total) by mouth at bedtime.  90 tablet  3  . aspirin EC 81 MG tablet Take 1 tablet (81 mg total) by mouth daily.        BP 144/82  Pulse 61 General: NAD, morbidly obese.  Neck: Thick, No JVD, no thyromegaly or thyroid nodule.  Lungs: Clear to auscultation bilaterally with normal respiratory effort. CV: Nondisplaced PMI.  Heart irregular S1/S2, no S3/S4, 2/6 early  SEM.  Trace ankle edema.  No carotid bruit.  Normal pedal pulses.  Abdomen: Soft, nontender, no hepatosplenomegaly, no distention.  Neurologic: Alert and oriented x 3.  Psych: Normal affect. Extremities: No clubbing or cyanosis.

## 2012-04-13 NOTE — Assessment & Plan Note (Signed)
Stable symptomatically.  He does not appear significantly volume overloaded.  Continue current Lasix dosing.  Checking BMET as above.

## 2012-04-13 NOTE — Assessment & Plan Note (Signed)
BP tends to run a bit high.  Will get BMET today, if K/creatinine not elevated, will increase lisinopril to 40 mg daily.

## 2012-04-17 ENCOUNTER — Other Ambulatory Visit: Payer: Self-pay | Admitting: *Deleted

## 2012-04-17 MED ORDER — LISINOPRIL 40 MG PO TABS: 40.0000 mg | ORAL_TABLET | Freq: Every day | ORAL | Status: DC

## 2012-04-18 ENCOUNTER — Other Ambulatory Visit: Payer: Self-pay | Admitting: *Deleted

## 2012-04-18 MED ORDER — CYCLOSPORINE 0.05 % OP EMUL: 1.0000 [drp] | Freq: Two times a day (BID) | OPHTHALMIC | Status: DC

## 2012-05-31 ENCOUNTER — Telehealth: Payer: Self-pay

## 2012-05-31 DIAGNOSIS — G4733 Obstructive sleep apnea (adult) (pediatric): Secondary | ICD-10-CM

## 2012-05-31 DIAGNOSIS — I5032 Chronic diastolic (congestive) heart failure: Secondary | ICD-10-CM

## 2012-05-31 NOTE — Telephone Encounter (Signed)
[facility] fax [phone] phone [phone]

## 2012-05-31 NOTE — Telephone Encounter (Signed)
Rx PRINTED AND FAXED TO [facility] FAX # [phone]. TO BE SCANNED INTO THE CHART

## 2012-05-31 NOTE — Telephone Encounter (Signed)
Happy to write Rx but need to know any pertinent details (if any).

## 2012-05-31 NOTE — Telephone Encounter (Signed)
Pt called requesting Rx for DME to [facility] fax [phone] for a CPAP machine and a wheelchair for his weight (500+ lbs).

## 2012-06-05 ENCOUNTER — Telehealth: Payer: Self-pay | Admitting: Internal Medicine

## 2012-06-05 NOTE — Telephone Encounter (Signed)
Patient received a motorized wheelchair but he wants a walker with a seat (420lbs +) and a CPAP machine, order needs to be faxed to [facility]

## 2012-06-06 NOTE — Telephone Encounter (Signed)
Order written and pending MD signature

## 2012-06-13 NOTE — Telephone Encounter (Signed)
Patient notified of orders written and signed by Dr. Linda Hedges and faxed to [facility]

## 2012-06-26 ENCOUNTER — Telehealth: Payer: Self-pay | Admitting: *Deleted

## 2012-06-26 DIAGNOSIS — G4733 Obstructive sleep apnea (adult) (pediatric): Secondary | ICD-10-CM

## 2012-06-26 NOTE — Telephone Encounter (Signed)
lacretia called from [facility] of order for patient for CPAP.  lacreatia unable to find any documentation that a qualified sleep study was done to get CPAP set up. Please advise. [phone]

## 2012-06-27 NOTE — Telephone Encounter (Signed)
Cannot locate sleep study in epic. Please check with either pulmonary or sleep lab or pull paper chart. He carries diagnosis of severe sleep apnea

## 2012-06-28 NOTE — Telephone Encounter (Signed)
Spoke with lacretia concerning sleep study. lacretia had already checked with  Pulmonary and sleep lab. She stated she was going to call patient and see where he had study done.

## 2012-07-20 ENCOUNTER — Other Ambulatory Visit: Payer: Self-pay | Admitting: Internal Medicine

## 2012-10-04 ENCOUNTER — Ambulatory Visit (INDEPENDENT_AMBULATORY_CARE_PROVIDER_SITE_OTHER): Payer: Medicare Other | Admitting: Internal Medicine

## 2012-10-04 ENCOUNTER — Other Ambulatory Visit (INDEPENDENT_AMBULATORY_CARE_PROVIDER_SITE_OTHER): Payer: Medicare Other

## 2012-10-04 ENCOUNTER — Encounter: Payer: Self-pay | Admitting: Internal Medicine

## 2012-10-04 VITALS — BP 130/80 | HR 65 | Temp 97.6°F | Resp 12 | Wt >= 6400 oz

## 2012-10-04 DIAGNOSIS — I10 Essential (primary) hypertension: Secondary | ICD-10-CM

## 2012-10-04 DIAGNOSIS — C189 Malignant neoplasm of colon, unspecified: Secondary | ICD-10-CM

## 2012-10-04 DIAGNOSIS — E109 Type 1 diabetes mellitus without complications: Secondary | ICD-10-CM

## 2012-10-04 DIAGNOSIS — E785 Hyperlipidemia, unspecified: Secondary | ICD-10-CM

## 2012-10-04 DIAGNOSIS — K921 Melena: Secondary | ICD-10-CM

## 2012-10-04 LAB — LIPID PANEL
Cholesterol: 155 mg/dL (ref 0–200)
HDL: 38.2 mg/dL — ABNORMAL LOW (ref >39.00)

## 2012-10-04 LAB — COMPREHENSIVE METABOLIC PANEL
Albumin: 3.5 g/dL (ref 3.5–5.2)
BUN: 16 mg/dL (ref 6–23)
CO2: 28 mEq/L (ref 19–32)
GFR: 77.15 mL/min (ref >60.00)
Glucose, Bld: 194 mg/dL — ABNORMAL HIGH (ref 70–99)
Potassium: 4.5 mEq/L (ref 3.5–5.1)
Sodium: 138 mEq/L (ref 135–145)
Total Bilirubin: 0.2 mg/dL — ABNORMAL LOW (ref 0.3–1.2)
Total Protein: 7.6 g/dL (ref 6.0–8.3)

## 2012-10-04 LAB — HEPATIC FUNCTION PANEL: Total Bilirubin: 0.2 mg/dL — ABNORMAL LOW (ref 0.3–1.2)

## 2012-10-04 LAB — HEMOGLOBIN A1C: Hgb A1c MFr Bld: 11.4 % — ABNORMAL HIGH (ref 4.6–6.5)

## 2012-10-04 NOTE — Assessment & Plan Note (Signed)
Patient presenting with lower abdominal pain and dark stools. He is s/p sigmoid colectomy for adenocarcinoma with 1/18 nodes positive, s/p chemo. Last surveillance colonoscopy in Feb '09, last CEA negative in Feb '11  Plan Referral to Dr. [name] - patient may be a candidate for surveillance colonoscopy and to evaluate for lower GI source of dark stool  Start H2 blocker therapy  CEA as part of follow up.  Bulk laxative - to help bulk up stool.

## 2012-10-04 NOTE — Assessment & Plan Note (Signed)
BP Readings from Last 3 Encounters:  10/04/12 130/80  04/12/12 144/82  02/27/12 130/78   Good control  Plan - continue present medication  Bmet today

## 2012-10-04 NOTE — Assessment & Plan Note (Signed)
Lab Results  Component Value Date   HGBA1C 13.2* 06/23/2011   Way overdue for lab work - sent to lab today with recommendations to follow.

## 2012-10-04 NOTE — Progress Notes (Signed)
Please contact patient and arrange appt

## 2012-10-04 NOTE — Progress Notes (Signed)
Subjective:    Patient ID: [name], male    DOB: August 27, 1956, 57 y.o.   MRN: [id number]  HPI Mr. Awalt presents with lower abdominal discomfort at night. Better with movement. He has been taking Pepto-Bismal but stopped a week ago and has had grey, blackish stool. His weight has been relatively stable. He has a history of colon cancer but last colonoscopy was Feb '09. Last CT abd/pelvis '08.   Past Medical History  Diagnosis Date  . Diabetes mellitus     Has Hx of diabetic foot ulcer & peripheral neuropathy  . Hyperlipidemia   . HTN (hypertension)   . Colon cancer 2007    s/p sigmoid colectomy  . Morbid obesity   . Anemia   . OSA on CPAP   . CHF (congestive heart failure)     Presumed diastolic. Echo (06/07) w.EF 45%, severe posterior HK, mild LV hypertrophy, No further work-up of abnormal echo was done.  Marland Kitchen History of PFTs 05/2009    Mild Obstructive defect   Past Surgical History  Procedure Date  . Sigmoid colectomy 10/2005    [facility]  . Corneal transplant [facility]    '80/Right  '84/Left  . Bariatric surgery 12/2009    Lap Band/At [facility]   Family History  Problem Relation Age of Onset  . Hepatitis Mother     hepatitis C  . Heart attack Brother 34  . Cancer Neg Hx     colon or prostate   History   Social History  . Marital Status: Legally Separated    Spouse Name: N/A    Number of Children: N/A  . 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 Smoking status: Never Smoker   . Smokeless tobacco: Not on file  . Alcohol Use: No     Comment: Only Occasional.  . Drug Use: No  . Sexually Active: Not Currently   Other Topics Concern  . 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 aspirin EC 81 MG tablet Take 1 tablet (81 mg total) by mouth daily.      . bisoprolol (ZEBETA) 10 MG tablet Take 1 tablet (10 mg total) by mouth daily.  90 tablet  3  . cycloSPORINE (RESTASIS) 0.05 % ophthalmic emulsion Place 1 drop into both eyes 2 (two) times daily.  0.4 mL  5  . furosemide (LASIX) 40 MG tablet Take 1 tablet (40 mg total) by mouth 2 (two) times daily.  180 tablet  3  . insulin detemir (LEVEMIR FLEXPEN) 100 UNIT/ML injection Inject 30 Units into the skin 2 (two) times daily.  15 mL  11  . lisinopril (PRINIVIL,ZESTRIL) 40 MG tablet Take 1 tablet (40 mg total) by mouth daily.  90 tablet  3  . metFORMIN (GLUCOPHAGE) 1000 MG tablet Take 1 tablet (1,000 mg total) by mouth 2 (two) times daily with a meal.  180 tablet  3  . polyethylene glycol powder (GLYCOLAX/MIRALAX) powder Take 17  g by mouth daily. Mix1 packet daily as needed      . simvastatin (ZOCOR) 40 MG tablet Take 1 tablet (40 mg total) by mouth at bedtime.  90 tablet  3     Review of Systems System review is negative for any constitutional, cardiac, pulmonary, GI or neuro symptoms or complaints other than as described in the HPI.     Objective:   Physical Exam Filed Vitals:   10/04/12 0904  BP: 130/80  Pulse: 65  Temp: 97.6 F (36.4 C)  Resp: 12   Wt Readings from Last 3 Encounters:  10/04/12 432 lb 1.9 oz (196.008 kg)  02/27/12 422 lb (191.418 kg)  12/15/11 408 lb (185.068 kg)   Gen'l - morbidly obese AA man in no acute distress Cor- 2+ radial Pulm - normal respirations Abdomen - morbidly obese, hypoactive BS, no guarding or rebound       Assessment & Plan:

## 2012-10-04 NOTE — Assessment & Plan Note (Signed)
Last lipid panel 6 months ago was good.  Plan - follow up lipid panel today with recommendations to follow

## 2012-10-04 NOTE — Patient Instructions (Addendum)
Dark stools and lower abdomen pain: concern for colon related problem.  Plan Referral to Dr. [name] for GI consult and probable colonoscopy  Take a bulk laxative like metamucil on a daily basis to help regulate the bowels  Avoid Pepto-Bismal  Start taking over the counter Zantac (generic is fine) 150 mg twice a day until you see Dr. [name].

## 2012-10-05 LAB — CEA: CEA: <0.5 ng/mL (ref 0.0–5.0)

## 2012-10-07 ENCOUNTER — Encounter: Payer: Self-pay | Admitting: Internal Medicine

## 2012-10-09 ENCOUNTER — Encounter: Payer: Self-pay | Admitting: Internal Medicine

## 2012-10-30 ENCOUNTER — Encounter: Payer: Self-pay | Admitting: *Deleted

## 2012-10-30 NOTE — Progress Notes (Signed)
Patient ID: [name], male   DOB: [birth year], 57 y.o.   MRN: [id number] Pt scheduled for colonoscopy at [facility] on 2/27 with Dr. [name]; PV scheduled for 2/13.  Chart review shows pt has BMI of 58.  Last colonoscopy for hx colon cancer 2009 at [facility].  Comments on procedure report say that "will need to consider overall health before scheduling colonoscopy." (Notes also say that pt will need more aggressive prep: used Miralax prep.) Pt scheduled for New pt appt with Dr. [name] 2/20 at 8:45.  PV and colonoscopy cancelled.  Pt aware of appt changes.

## 2012-10-30 NOTE — Telephone Encounter (Signed)
error 

## 2012-11-06 ENCOUNTER — Ambulatory Visit (INDEPENDENT_AMBULATORY_CARE_PROVIDER_SITE_OTHER): Payer: Medicare Other | Admitting: Internal Medicine

## 2012-11-06 ENCOUNTER — Encounter: Payer: Self-pay | Admitting: Internal Medicine

## 2012-11-06 VITALS — BP 138/82 | HR 65 | Temp 97.1°F | Ht 72.0 in | Wt >= 6400 oz

## 2012-11-06 DIAGNOSIS — L98499 Non-pressure chronic ulcer of skin of other sites with unspecified severity: Secondary | ICD-10-CM

## 2012-11-06 DIAGNOSIS — L98491 Non-pressure chronic ulcer of skin of other sites limited to breakdown of skin: Secondary | ICD-10-CM

## 2012-11-06 NOTE — Progress Notes (Signed)
Subjective:    Patient ID: [name], male    DOB: 1956-04-04, 57 y.o.   MRN: [id number]  HPI  Pt presents to the clinic today with c/o dark colored skin and hard skin on the toes of his left foot. He has been seen by Dr. Linda Hedges in the past for this. He says the skin gets dry and hard and then cracks leading to an ulcer. This started about 3 weeks ago. He has not put anything on it. It is not painful but he does have decreased sensation in his feet secondary to diabetic neuropathy. He is requesting another referral to the wound center for further treatment.  Review of Systems      Past Medical History  Diagnosis Date  . Diabetes mellitus     Has Hx of diabetic foot ulcer & peripheral neuropathy  . Hyperlipidemia   . HTN (hypertension)   . Colon cancer 2007    s/p sigmoid colectomy  . Morbid obesity   . Anemia   . OSA on CPAP   . CHF (congestive heart failure)     Presumed diastolic. Echo (06/07) w.EF 45%, severe posterior HK, mild LV hypertrophy, No further work-up of abnormal echo was done.  Marland Kitchen History of PFTs 05/2009    Mild Obstructive defect    Current Outpatient Prescriptions  Medication Sig Dispense Refill  . aspirin EC 81 MG tablet Take 1 tablet (81 mg total) by mouth daily.      . bisoprolol (ZEBETA) 10 MG tablet Take 1 tablet (10 mg total) by mouth daily.  90 tablet  3  . cycloSPORINE (RESTASIS) 0.05 % ophthalmic emulsion Place 1 drop into both eyes 2 (two) times daily.  0.4 mL  5  . furosemide (LASIX) 40 MG tablet Take 1 tablet (40 mg total) by mouth 2 (two) times daily.  180 tablet  3  . insulin detemir (LEVEMIR FLEXPEN) 100 UNIT/ML injection Inject 30 Units into the skin 2 (two) times daily.  15 mL  11  . lisinopril (PRINIVIL,ZESTRIL) 40 MG tablet Take 1 tablet (40 mg total) by mouth daily.  90 tablet  3  . metFORMIN (GLUCOPHAGE) 1000 MG tablet Take 1 tablet (1,000 mg total) by mouth 2 (two) times daily with a meal.  180 tablet  3  . polyethylene glycol powder  (GLYCOLAX/MIRALAX) powder Take 17 g by mouth daily. Mix1 packet daily as needed      . simvastatin (ZOCOR) 40 MG tablet Take 1 tablet (40 mg total) by mouth at bedtime.  90 tablet  3   No current facility-administered medications for this visit.    Allergies  Allergen Reactions  . Shellfish Allergy     Family History  Problem Relation Age of Onset  . Hepatitis Mother     hepatitis C  . Heart attack Brother 71  . Cancer Neg Hx     colon or prostate    History   Social History  . Marital Status: Legally Separated    Spouse Name: N/A    Number of Children: N/A  . 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 Smoking status: Never Smoker   . Smokeless tobacco: Not on file  . Alcohol Use: No     Comment: Only Occasional.  . Drug Use: No  . Sexually Active: Not Currently   Other Topics Concern  . 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   In new relationship (04/2009) - girlfriend helps taste for cooking business and helps monitor for ulcers.   Regular Exercise- No           Constitutional: Denies fever, malaise, fatigue, headache or abrupt weight changes.  Musculoskeletal: Denies decrease in range of motion, difficulty with gait, muscle pain or joint pain and swelling.  Skin: Pt reports dryness and thickening of skin around toes on left foot. Denies redness, rashes, lesions or ulcercations.  Neurological: Pt reports decreased sensation in bilateral feet. Denies dizziness, difficulty with memory, difficulty with speech or problems with balance and coordination.   No other specific complaints in a complete review of systems (except as listed in HPI above).  Objective:    Physical Exam   BP 138/82  Pulse 65  Temp(Src) 97.1 F (36.2 C) (Oral)  Ht 6' (1.829 m)  Wt 437 lb (198.222 kg)  BMI 59.25 kg/m2  SpO2 97% Wt Readings from Last 3 Encounters:  11/06/12 437 lb (198.222 kg)  10/04/12 432 lb 1.9 oz (196.008 kg)  02/27/12 422 lb (191.418 kg)    General: Appears his stated age, obese but  well developed, well nourished in NAD. Skin: Dark and thickened around nails of the left toe. Small callous ulcer noted on left big toed. No rashes, lesions noted.  Cardiovascular: Normal rate and rhythm. S1,S2 noted.  No murmur, rubs or gallops noted. No JVD or BLE edema. No carotid bruits noted. Pulmonary/Chest: Normal effort and positive vesicular breath sounds. No respiratory distress. No wheezes, rales or ronchi noted.  Musculoskeletal: Normal range of motion. No signs of joint swelling. Mild difficulty with gait secondary to obesity.  Neurological: Alert and oriented. Coordination normal. +DTRs bilaterally. Decreased sensation by 10 gm monofilament.       Assessment & Plan:  Callous ulcer, left big toe, additional workup required:  Referral placed for wound care center for further treatment Talked with patient about how to care for feet  RTC as needed or if symptom persist

## 2012-11-06 NOTE — Patient Instructions (Signed)
Skin Ulcer A skin ulcer is an open sore that can be shallow or deep. Skin ulcers sometimes become infected and are difficult to treat. It may be 1 month or longer before real healing progress is made. CAUSES   Injury.  Problems with the veins or arteries.  Diabetes.  Insect bites.  Bedsores.  Inflammatory conditions. SYMPTOMS   Pain, redness, swelling, and tenderness around the ulcer.  Fever.  Bleeding from the ulcer.  Yellow or clear fluid coming from the ulcer. DIAGNOSIS  There are many types of skin ulcers. Any open sores will be examined. Certain tests will be done to determine the kind of ulcer you have. The right treatment depends on the type of ulcer you have. TREATMENT  Treatment is a long-term challenge. It may include:  Wearing an elastic wrap, compression stockings, or gel cast over the ulcer area.  Taking antibiotic medicines or putting antibiotic creams on the affected area if there is an infection. HOME CARE INSTRUCTIONS  Put on your bandages (dressings), wraps, or casts over the ulcer as directed by your caregiver.  Change all dressings as directed by your caregiver.  Take all medicines as directed by your caregiver.  Keep the affected area clean and dry.  Avoid injuries to the affected area.  Eat a well-balanced, healthy diet that includes plenty of fruit and vegetables.  If you smoke, consider quitting or decreasing the amount of cigarettes you smoke.  Once the ulcer heals, get regular exercise as directed by your caregiver.  Work with your caregiver to make sure your blood pressure, cholesterol, and diabetes are well-controlled.  Keep your skin moisturized. Dry skin can crack and lead to skin ulcers. SEEK IMMEDIATE MEDICAL CARE IF:   Your pain gets worse.  You have swelling, redness, or fluids around the ulcer.  You have chills.  You have a fever. MAKE SURE YOU:   Understand these instructions.  Will watch your condition.  Will get  help right away if you are not doing well or get worse. Document Released: 10/13/2004 Document Revised: 11/28/2011 Document Reviewed: 04/22/2011 [facility] Patient Information 2013 [place].

## 2012-11-08 ENCOUNTER — Encounter: Payer: Self-pay | Admitting: Internal Medicine

## 2012-11-08 ENCOUNTER — Ambulatory Visit (INDEPENDENT_AMBULATORY_CARE_PROVIDER_SITE_OTHER): Payer: Medicare Other | Admitting: Internal Medicine

## 2012-11-08 ENCOUNTER — Other Ambulatory Visit (INDEPENDENT_AMBULATORY_CARE_PROVIDER_SITE_OTHER): Payer: Medicare Other

## 2012-11-08 VITALS — BP 124/70 | HR 62 | Ht 72.0 in | Wt >= 6400 oz

## 2012-11-08 DIAGNOSIS — Z1211 Encounter for screening for malignant neoplasm of colon: Secondary | ICD-10-CM

## 2012-11-08 DIAGNOSIS — R1030 Lower abdominal pain, unspecified: Secondary | ICD-10-CM

## 2012-11-08 DIAGNOSIS — Z85038 Personal history of other malignant neoplasm of large intestine: Secondary | ICD-10-CM

## 2012-11-08 LAB — URINALYSIS, ROUTINE W REFLEX MICROSCOPIC
Bilirubin Urine: NEGATIVE
Ketones, ur: NEGATIVE
Leukocytes, UA: NEGATIVE
pH: 6 (ref 5.0–8.0)

## 2012-11-08 MED ORDER — NA SULFATE-K SULFATE-MG SULF 17.5-3.13-1.6 GM/177ML PO SOLN: ORAL | Status: DC

## 2012-11-08 NOTE — Progress Notes (Signed)
Quick Note:  UA is ok - await colonoscopy ______

## 2012-11-08 NOTE — Patient Instructions (Addendum)
You have been scheduled for a colonoscopy with propofol. Please follow written instructions given to you at your visit today.  Please use the suprep kit you have been given today. If you use inhalers (even only as needed) or a CPAP machine, please bring them with you on the day of your procedure.  The pre-surgical dept. Will contact you about an appointment prior to the colonoscopy.  Your physician has requested that you go to the basement for the following lab work before leaving today: urine  Thank you for choosing me and [facility].  Gatha Mayer, M.D., [facility]

## 2012-11-08 NOTE — Progress Notes (Signed)
Subjective:    Patient ID: [name], male    DOB: [birth year], 57 y.o.   MRN: [id number]  HPI He is here because of lower abdominal pain. Saw Dr. Linda Hedges - stools were dark but this went away after stopping PeptoBismol. He says he awakens early AM with lower abdominal/suprapubic pain and has to urinate, then pain is gone. No dysuria. Problems x 1-2 months. Pepto-bismol did not help. Hgb stable - mild chronic anemia.   Bowel habits are regular. No bleeding.  He is 7 years out from 1 node + sigmoid resection of colon cancer, took chemotx x 1 year    Allergies  Allergen Reactions  . Shellfish Allergy    Outpatient Prescriptions Prior to Visit  Medication Sig Dispense Refill  . aspirin EC 81 MG tablet Take 1 tablet (81 mg total) by mouth daily.      . bisoprolol (ZEBETA) 10 MG tablet Take 1 tablet (10 mg total) by mouth daily.  90 tablet  3  . cycloSPORINE (RESTASIS) 0.05 % ophthalmic emulsion Place 1 drop into both eyes 2 (two) times daily.  0.4 mL  5  . furosemide (LASIX) 40 MG tablet Take 1 tablet (40 mg total) by mouth 2 (two) times daily.  180 tablet  3  . insulin detemir (LEVEMIR FLEXPEN) 100 UNIT/ML injection Inject 30 Units into the skin 2 (two) times daily.  15 mL  11  . lisinopril (PRINIVIL,ZESTRIL) 40 MG tablet Take 1 tablet (40 mg total) by mouth daily.  90 tablet  3  . metFORMIN (GLUCOPHAGE) 1000 MG tablet Take 1 tablet (1,000 mg total) by mouth 2 (two) times daily with a meal.  180 tablet  3  . polyethylene glycol powder (GLYCOLAX/MIRALAX) powder Take 17 g by mouth daily. Mix1 packet daily as needed      . simvastatin (ZOCOR) 40 MG tablet Take 1 tablet (40 mg total) by mouth at bedtime.  90 tablet  3   No facility-administered medications prior to visit.   Past Medical History  Diagnosis Date  . Diabetes mellitus     Has Hx of diabetic foot ulcer & peripheral neuropathy  . Hyperlipidemia   . HTN (hypertension)   . Colon cancer 2007    s/p sigmoid colectomy  .  Morbid obesity   . Anemia   . OSA on CPAP   . CHF (congestive heart failure)     Presumed diastolic. Echo (06/07) w.EF 45%, severe posterior HK, mild LV hypertrophy, No further work-up of abnormal echo was done.  Marland Kitchen History of PFTs 05/2009    Mild Obstructive defect   Past Surgical History  Procedure Laterality Date  . Sigmoid colectomy  10/2005    [facility]  . Corneal transplant  [place]    '80/Right  '84/Left  . Bariatric surgery  12/2009    Lap Band/At [facility]  . Hip surgery      bilateral  Colonoscopy - multiple History   Social History  . Marital Status: Legally Separated    Spouse Name: N/A    Number of Children: 0  . Years of Education: N/A   Occupational History  . Food Industry    Social History Main Topics  . Smoking status: Never Smoker   . Smokeless tobacco: Never Used  . Alcohol Use: No     Comment: Only Occasional.  . Drug Use: No  . Sexually Active: Not Currently   Other Topics Concern  . 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   In new relationship (04/2009) - girlfriend helps taste for cooking business and helps monitor for ulcers.   Regular Exercise- No         Family History  Problem Relation Age of Onset  . Hepatitis Mother     hepatitis C  . Heart attack Brother 93  . Cancer Neg Hx     colon or prostate  . Diabetes Mother   . Diabetes Father         Review of Systems DM out of control, uses a walker to ambulate, joint pains, morbid obesity still a problem - "lap band failed but I am not going to have any other surgery"    Objective:   Physical Exam General:  no acute distress - morbidly obese Eyes:  anicteric. ENT:   Mouth and posterior pharynx free of lesions.  Lungs: Clear to auscultation bilaterally. Heart:   S1S2, no rubs, murmurs, gallops. Abdomen:  morbidly obese, soft, non-tender, no hepatosplenomegaly, hernia, or mass and BS but obesity limits exam some Rectal: deferred Lymph:  no cervical or supraclavicular adenopathy. Extremities:   no edema Neuro:  A&O x 3.  Psych:  appropriate mood and  Affect.   Data Reviewed: PCP note from 09/2012, labs in [place]:  Lower abdominal pain - Plan: Urinalysis - sxs reieved by urination, will check prostate at colonoscopy  Personal history of malignant neoplasm of large intestine - Plan: colonoscopy at hospital, MAC due to morbid obesity Special screening for malignant neoplasms, colon   Morbid obesity - he is not interested in surgical intervention, says he is working out  The risks and benefits as well as alternatives of endoscopic procedure(s) have been discussed and reviewed. All questions answered. The patient agrees to proceed. Will adjust diabetic medications per protocol.   I appreciate the opportunity to care for this patient.  [id number] Norins, MD

## 2012-11-09 ENCOUNTER — Encounter (HOSPITAL_COMMUNITY): Payer: Self-pay | Admitting: *Deleted

## 2012-11-09 NOTE — Pre-Procedure Instructions (Signed)
Your procedure is scheduled [id number], November 26, 2012 Report to [place] [id number] Call this number if you have problems morning of your [phone]  Follow all bowel prep instructions per your doctor's orders.  Do not eat or drink anything after midnight the night before your procedure. You may brush your teeth, rinse out your mouth, but no water, no food, no chewing gum, no mints, no candies, no chewing tobacco.     Take these medicines the morning of your procedure with A SIP OF WATER:Bisoprolol  Take 1/2 of evening Levemir Insulin per anesthesia orders Sunday evening 11-18-2012 and no diabetic medication Monday morning   Please make arrangements for a responsible person to drive you home after the procedure. You cannot go home by cab/taxi. We recommend you have someone with you at home the first 24 hours after your procedure. Driver for procedure is sister Melanee Left [phone] told that he has to have someone with him to go home  [place], [place], [place].  NO DENTURES, CONTACT LENSES ALLOWED IN THE ENDOSCOPY ROOM.   YOU MAY WEAR DEODORANT, PLEASE REMOVE ALL JEWELRY, WATCHES RINGS, BODY PIERCINGS AND LEAVE AT HOME.   WOMEN: NO MAKE-UP, LOTIONS PERFUMES

## 2012-11-13 ENCOUNTER — Encounter (HOSPITAL_BASED_OUTPATIENT_CLINIC_OR_DEPARTMENT_OTHER): Payer: Medicare Other | Attending: General Surgery

## 2012-11-13 ENCOUNTER — Other Ambulatory Visit: Payer: Self-pay | Admitting: *Deleted

## 2012-11-13 DIAGNOSIS — I509 Heart failure, unspecified: Secondary | ICD-10-CM | POA: Insufficient documentation

## 2012-11-13 DIAGNOSIS — Z79899 Other long term (current) drug therapy: Secondary | ICD-10-CM | POA: Insufficient documentation

## 2012-11-13 DIAGNOSIS — L97509 Non-pressure chronic ulcer of other part of unspecified foot with unspecified severity: Secondary | ICD-10-CM | POA: Insufficient documentation

## 2012-11-13 DIAGNOSIS — G4730 Sleep apnea, unspecified: Secondary | ICD-10-CM | POA: Insufficient documentation

## 2012-11-13 DIAGNOSIS — I10 Essential (primary) hypertension: Secondary | ICD-10-CM | POA: Insufficient documentation

## 2012-11-13 DIAGNOSIS — E1142 Type 2 diabetes mellitus with diabetic polyneuropathy: Secondary | ICD-10-CM | POA: Insufficient documentation

## 2012-11-13 DIAGNOSIS — E1149 Type 2 diabetes mellitus with other diabetic neurological complication: Secondary | ICD-10-CM | POA: Insufficient documentation

## 2012-11-13 DIAGNOSIS — E785 Hyperlipidemia, unspecified: Secondary | ICD-10-CM | POA: Insufficient documentation

## 2012-11-13 MED ORDER — SIMVASTATIN 40 MG PO TABS: 40.0000 mg | ORAL_TABLET | Freq: Every day | ORAL | Status: DC

## 2012-11-14 NOTE — H&P (Signed)
[name], [name] NO.:  [id number]  MEDICAL RECORD NO.:  [id number]  LOCATION:  FOOT                         FACILITY:  [facility]  PHYSICIAN:  Elesa Hacker, M.D.        DATE OF BIRTH:  1956-07-03  DATE OF ADMISSION:  11/13/2012 DATE OF DISCHARGE:                             HISTORY & PHYSICAL   CHIEF COMPLAINT:  Tenderness, left great toe.  HISTORY OF PRESENT ILLNESS:  This is a diabetic patient who has had an ulcer of his left great toe yearly for the last 3 years.  This was usually healed with routine treatment.  He has recently developed some tenderness of the left great toe and is concerned about the possibility of an ulcer.  PAST MEDICAL HISTORY:  Includes diabetes, hyperlipidemia, hypertension, colon cancer, for which, he has had a sigmoid colectomy, morbid obesity, anemia, congestive heart failure, and sleep apnea.  PAST SURGICAL HISTORY:  Colon resection, laparoscopic lap band, and corneal transplantation.  SOCIAL HISTORY:  Cigarettes none.  Alcohol none.  MEDICATIONS:  Zebeta, Restasis, Lasix, Levemir, lisinopril, metformin, and Zocor.  ALLERGIES:  SHELLFISH.  REVIEW OF SYSTEMS:  As above.  PHYSICAL EXAMINATION:  VITAL SIGNS:  Temperature 97.6, pulse 60 and regular, respirations 16, blood pressure 165/88.  Glucose is 213. GENERAL:  Well developed, obese, in no distress. HEAD, EYES, EARS, NOSE, THROAT:  Normal. NECK:  Supple. CHEST:  Clear. EXTREMITIES:  Examination of the lower extremities reveals no open ulcerations.  There is some callus at the left great toe, which is pared down and there is no ulceration present.  IMPRESSION:  No open wound and the patient with diabetic neuropathy.  RECOMMENDATION:  Diabetic foot wear.  Examine feet daily.  See [place] p.r.n.     Elesa Hacker, M.D.     RA/MEDQ  D:  11/13/2012  T:  11/14/2012  Job:  [id number]

## 2012-11-15 ENCOUNTER — Encounter: Payer: Medicare Other | Admitting: Internal Medicine

## 2012-11-20 ENCOUNTER — Encounter (HOSPITAL_COMMUNITY): Payer: Self-pay | Admitting: Pharmacy Technician

## 2012-11-26 ENCOUNTER — Encounter (HOSPITAL_COMMUNITY)
Admission: RE | Disposition: A | Discharge status: Home / Self Care | Payer: Self-pay | Source: Ambulatory Visit | Attending: Internal Medicine

## 2012-11-26 ENCOUNTER — Encounter (HOSPITAL_COMMUNITY): Payer: Self-pay | Admitting: *Deleted

## 2012-11-26 ENCOUNTER — Encounter (HOSPITAL_COMMUNITY): Payer: Self-pay | Admitting: Anesthesiology

## 2012-11-26 ENCOUNTER — Ambulatory Visit (HOSPITAL_COMMUNITY): Payer: Medicare Other | Admitting: Anesthesiology

## 2012-11-26 ENCOUNTER — Ambulatory Visit (HOSPITAL_COMMUNITY)
Admission: RE | Admit: 2012-11-26 | Discharge: 2012-11-26 | Disposition: A | Discharge status: Home / Self Care | Payer: Medicare Other | Source: Ambulatory Visit | Attending: Internal Medicine | Admitting: Internal Medicine

## 2012-11-26 DIAGNOSIS — Z85038 Personal history of other malignant neoplasm of large intestine: Secondary | ICD-10-CM | POA: Diagnosis present

## 2012-11-26 DIAGNOSIS — E119 Type 2 diabetes mellitus without complications: Secondary | ICD-10-CM | POA: Insufficient documentation

## 2012-11-26 DIAGNOSIS — I10 Essential (primary) hypertension: Secondary | ICD-10-CM | POA: Insufficient documentation

## 2012-11-26 DIAGNOSIS — G4733 Obstructive sleep apnea (adult) (pediatric): Secondary | ICD-10-CM | POA: Insufficient documentation

## 2012-11-26 DIAGNOSIS — D649 Anemia, unspecified: Secondary | ICD-10-CM | POA: Insufficient documentation

## 2012-11-26 DIAGNOSIS — Z9221 Personal history of antineoplastic chemotherapy: Secondary | ICD-10-CM | POA: Insufficient documentation

## 2012-11-26 DIAGNOSIS — R109 Unspecified abdominal pain: Secondary | ICD-10-CM | POA: Insufficient documentation

## 2012-11-26 DIAGNOSIS — Z1211 Encounter for screening for malignant neoplasm of colon: Secondary | ICD-10-CM

## 2012-11-26 DIAGNOSIS — E785 Hyperlipidemia, unspecified: Secondary | ICD-10-CM | POA: Insufficient documentation

## 2012-11-26 DIAGNOSIS — Z79899 Other long term (current) drug therapy: Secondary | ICD-10-CM | POA: Insufficient documentation

## 2012-11-26 DIAGNOSIS — Z7982 Long term (current) use of aspirin: Secondary | ICD-10-CM | POA: Insufficient documentation

## 2012-11-26 DIAGNOSIS — Z794 Long term (current) use of insulin: Secondary | ICD-10-CM | POA: Insufficient documentation

## 2012-11-26 DIAGNOSIS — Z980 Intestinal bypass and anastomosis status: Secondary | ICD-10-CM | POA: Insufficient documentation

## 2012-11-26 HISTORY — PX: COLONOSCOPY WITH PROPOFOL: SHX5780

## 2012-11-26 SURGERY — COLONOSCOPY WITH PROPOFOL
Anesthesia: Monitor Anesthesia Care

## 2012-11-26 MED ORDER — INSULIN ASPART 100 UNIT/ML ~~LOC~~ SOLN
8.0000 [IU] | Freq: Once | SUBCUTANEOUS | Status: DC
  Filled 2012-11-26: qty 0.08

## 2012-11-26 MED ORDER — KETAMINE HCL 10 MG/ML IJ SOLN
INTRAMUSCULAR | Status: DC | PRN
  Administered 2012-11-26: 10 mg via INTRAVENOUS

## 2012-11-26 MED ORDER — PROPOFOL INFUSION 10 MG/ML OPTIME
INTRAVENOUS | Status: DC | PRN
  Administered 2012-11-26: 50 ug/kg/min via INTRAVENOUS

## 2012-11-26 MED ORDER — INSULIN ASPART 100 UNIT/ML ~~LOC~~ SOLN
0.0000 [IU] | SUBCUTANEOUS | Status: DC
  Administered 2012-11-26: 5 [IU] via SUBCUTANEOUS
  Filled 2012-11-26: qty 3

## 2012-11-26 MED ORDER — LACTATED RINGERS IV SOLN
INTRAVENOUS | Status: DC
  Administered 2012-11-26: 09:00:00 via INTRAVENOUS

## 2012-11-26 MED ORDER — DICYCLOMINE HCL 20 MG PO TABS: 20.0000 mg | ORAL_TABLET | Freq: Four times a day (QID) | ORAL | Status: DC | PRN

## 2012-11-26 MED ORDER — SODIUM CHLORIDE 0.9 % IV SOLN: INTRAVENOUS | Status: DC

## 2012-11-26 MED ORDER — LIDOCAINE HCL (CARDIAC) 20 MG/ML IV SOLN
INTRAVENOUS | Status: DC | PRN
  Administered 2012-11-26: 100 mg via INTRAVENOUS

## 2012-11-26 MED ORDER — FENTANYL CITRATE 0.05 MG/ML IJ SOLN: 25.0000 ug | INTRAMUSCULAR | Status: DC | PRN

## 2012-11-26 MED ORDER — MIDAZOLAM HCL 5 MG/5ML IJ SOLN
INTRAMUSCULAR | Status: DC | PRN
  Administered 2012-11-26: 2 mg via INTRAVENOUS

## 2012-11-26 SURGICAL SUPPLY — 22 items

## 2012-11-26 NOTE — Interval H&P Note (Signed)
History and Physical Interval Note:  11/26/2012 9:54 AM  [name]  has presented today for surgery, with the diagnosis of previous colon ca/screening  The various methods of treatment have been discussed with the patient and family. After consideration of risks, benefits and other options for treatment, the patient has consented to  Procedure(s) with comments: COLONOSCOPY WITH PROPOFOL (N/A) - may need pre appt. with anesthesia due to morbid obesity as a surgical intervention .  The patient's history has been reviewed, patient examined, no change in status, stable for surgery.  I have reviewed the patient's chart and labs.  Questions were answered to the patient's satisfaction.     Silvano Rusk, MD, [facility]

## 2012-11-26 NOTE — H&P (View-Only) (Signed)
Subjective:    Patient ID: [name], male    DOB: 1956/04/06, 57 y.o.   MRN: [id number]  HPI He is here because of lower abdominal pain. Saw Dr. Linda Hedges - stools were dark but this went away after stopping PeptoBismol. He says he awakens early AM with lower abdominal/suprapubic pain and has to urinate, then pain is gone. No dysuria. Problems x 1-2 months. Pepto-bismol did not help. Hgb stable - mild chronic anemia.   Bowel habits are regular. No bleeding.  He is 7 years out from 1 node + sigmoid resection of colon cancer, took chemotx x 1 year    Allergies  Allergen Reactions  . Shellfish Allergy    Outpatient Prescriptions Prior to Visit  Medication Sig Dispense Refill  . aspirin EC 81 MG tablet Take 1 tablet (81 mg total) by mouth daily.      . bisoprolol (ZEBETA) 10 MG tablet Take 1 tablet (10 mg total) by mouth daily.  90 tablet  3  . cycloSPORINE (RESTASIS) 0.05 % ophthalmic emulsion Place 1 drop into both eyes 2 (two) times daily.  0.4 mL  5  . furosemide (LASIX) 40 MG tablet Take 1 tablet (40 mg total) by mouth 2 (two) times daily.  180 tablet  3  . insulin detemir (LEVEMIR FLEXPEN) 100 UNIT/ML injection Inject 30 Units into the skin 2 (two) times daily.  15 mL  11  . lisinopril (PRINIVIL,ZESTRIL) 40 MG tablet Take 1 tablet (40 mg total) by mouth daily.  90 tablet  3  . metFORMIN (GLUCOPHAGE) 1000 MG tablet Take 1 tablet (1,000 mg total) by mouth 2 (two) times daily with a meal.  180 tablet  3  . polyethylene glycol powder (GLYCOLAX/MIRALAX) powder Take 17 g by mouth daily. Mix1 packet daily as needed      . simvastatin (ZOCOR) 40 MG tablet Take 1 tablet (40 mg total) by mouth at bedtime.  90 tablet  3   No facility-administered medications prior to visit.   Past Medical History  Diagnosis Date  . Diabetes mellitus     Has Hx of diabetic foot ulcer & peripheral neuropathy  . Hyperlipidemia   . HTN (hypertension)   . Colon cancer 2007    s/p sigmoid colectomy  .  Morbid obesity   . Anemia   . OSA on CPAP   . CHF (congestive heart failure)     Presumed diastolic. Echo (06/07) w.EF 45%, severe posterior HK, mild LV hypertrophy, No further work-up of abnormal echo was done.  Marland Kitchen History of PFTs 05/2009    Mild Obstructive defect   Past Surgical History  Procedure Laterality Date  . Sigmoid colectomy  10/2005    [facility]  . Corneal transplant  [place]    '80/Right  '84/Left  . Bariatric surgery  12/2009    Lap Band/At [facility]  . Hip surgery      bilateral  Colonoscopy - multiple History   Social History  . Marital Status: Legally Separated    Spouse Name: N/A    Number of Children: 0  . Years of Education: N/A   Occupational History  . Food Industry    Social History Main Topics  . Smoking status: Never Smoker   . Smokeless tobacco: Never Used  . Alcohol Use: No     Comment: Only Occasional.  . Drug Use: No  . Sexually Active: Not Currently   Other Topics Concern  . 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   In new relationship (04/2009) - girlfriend helps taste for cooking business and helps monitor for ulcers.   Regular Exercise- No         Family History  Problem Relation Age of Onset  . Hepatitis Mother     hepatitis C  . Heart attack Brother 56  . Cancer Neg Hx     colon or prostate  . Diabetes Mother   . Diabetes Father         Review of Systems DM out of control, uses a walker to ambulate, joint pains, morbid obesity still a problem - "lap band failed but I am not going to have any other surgery"    Objective:   Physical Exam General:  no acute distress - morbidly obese Eyes:  anicteric. ENT:   Mouth and posterior pharynx free of lesions.  Lungs: Clear to auscultation bilaterally. Heart:   S1S2, no rubs, murmurs, gallops. Abdomen:  morbidly obese, soft, non-tender, no hepatosplenomegaly, hernia, or mass and BS but obesity limits exam some Rectal: deferred Lymph:  no cervical or supraclavicular adenopathy. Extremities:   no edema Neuro:  A&O x 3.  Psych:  appropriate mood and  Affect.   Data Reviewed: PCP note from 09/2012, labs in [place]:  Lower abdominal pain - Plan: Urinalysis - sxs reieved by urination, will check prostate at colonoscopy  Personal history of malignant neoplasm of large intestine - Plan: colonoscopy at hospital, MAC due to morbid obesity Special screening for malignant neoplasms, colon   Morbid obesity - he is not interested in surgical intervention, says he is working out  The risks and benefits as well as alternatives of endoscopic procedure(s) have been discussed and reviewed. All questions answered. The patient agrees to proceed. Will adjust diabetic medications per protocol.   I appreciate the opportunity to care for this patient.  [id number] Norins, MD

## 2012-11-26 NOTE — Anesthesia Preprocedure Evaluation (Addendum)
Anesthesia Evaluation  Patient identified by MRN, date of birth, ID band Patient awake    Reviewed: Allergy & Precautions, H&P , NPO status , Patient's Chart, lab work & pertinent test results, reviewed documented beta blocker date and time   History of Anesthesia Complications (+) AWARENESS UNDER ANESTHESIA  Airway Mallampati: II TM Distance: >3 FB Neck ROM: full    Dental  (+) Edentulous Upper, Edentulous Lower and Dental Advisory Given   Pulmonary shortness of breath and with exertion, sleep apnea and Continuous Positive Airway Pressure Ventilation , COPD Mild obstructive defect on PFT breath sounds clear to auscultation  Pulmonary exam normal       Cardiovascular hypertension, Pt. on home beta blockers +CHF Rhythm:regular Rate:Normal  Diastolic heart failure. EF 45% 6/07 echo.   Neuro/Psych Peripheral neuropathy negative neurological ROS  negative psych ROS   GI/Hepatic negative GI ROS, Neg liver ROS,   Endo/Other  diabetes, Well Controlled, Type 2, Oral Hypoglycemic Agents and Insulin DependentMorbid obesity  Renal/GU negative Renal ROS  negative genitourinary   Musculoskeletal   Abdominal (+) + obese,   Peds  Hematology negative hematology ROS (+)   Anesthesia Other Findings   Reproductive/Obstetrics negative OB ROS                       Anesthesia Physical Anesthesia Plan  ASA: III  Anesthesia Plan: MAC   Post-op Pain Management:    Induction:   Airway Management Planned: Simple Face Mask  Additional Equipment:   Intra-op Plan:   Post-operative Plan:   Informed Consent: I have reviewed the patients History and Physical, chart, labs and discussed the procedure including the risks, benefits and alternatives for the proposed anesthesia with the patient or authorized representative who has indicated his/her understanding and acceptance.   Dental Advisory Given  Plan Discussed  with: CRNA and Surgeon  Anesthesia Plan Comments:         Anesthesia Quick Evaluation

## 2012-11-26 NOTE — Transfer of Care (Signed)
Immediate Anesthesia Transfer of Care Note  Patient: [name]  Procedure(s) Performed: Procedure(s) with comments: COLONOSCOPY WITH PROPOFOL (N/A) - may need pre appt. with anesthesia due to morbid obesity  Patient Location: PACU  Anesthesia Type:MAC  Level of Consciousness: awake, alert , oriented and patient cooperative  Airway & Oxygen Therapy: Patient Spontanous Breathing and Patient connected to face mask oxygen  Post-op Assessment: Report given to PACU RN and Post -op Vital signs reviewed and stable  Post vital signs: Reviewed and stable  Complications: No apparent anesthesia complications

## 2012-11-26 NOTE — Op Note (Signed)
[facility] [place], [id number]   COLONOSCOPY PROCEDURE REPORT  PATIENT: [name], [name]  MR#: [id number] BIRTHDATE: 05-02-1956 , 84  yrs. old GENDER: Male ENDOSCOPIST: Gatha Mayer, MD, [facility] PROCEDURE DATE:  11/26/2012 PROCEDURE:   Colonoscopy, screening ASA CLASS:   Class III INDICATIONS:High risk patient with personal history of colon cancer.  MEDICATIONS: MAC sedation, administered by CRNA and See Anesthesia Report.  DESCRIPTION OF PROCEDURE:   After the risks benefits and alternatives of the procedure were thoroughly explained, informed consent was obtained.  A digital rectal exam revealed no abnormalities of the rectum and A digital rectal exam revealed the prostate was not enlarged.   The     endoscope was introduced through the anus and advanced to the cecum, which was identified by both the appendix and ileocecal valve. No adverse events experienced.   The quality of the prep was [place] good  The instrument was then slowly withdrawn as the colon was fully examined.      COLON FINDINGS: There was evidence of a prior end-to-side colo-colonic surgical anastomosis in the distal sigmoid colon. The colon mucosa was otherwise normal.  Retroflexed views revealed no abnormalities. The time to cecum=5 minutes 0 seconds. Withdrawal time=10 minutes 0 seconds.  The scope was withdrawn and the procedure completed. COMPLICATIONS: There were no complications.  ENDOSCOPIC IMPRESSION: 1.   There was evidence of a prior colo-colonic surgical anastomosis in the distal sigmoid colon 2.   The colon mucosa was otherwise normal - adequate prep  RECOMMENDATIONS: Repeat Colonoscopy in 5 years (2019) in this man with hx resection of colon cancer   eSigned:  Gatha Mayer, MD, [facility] 11/26/2012 11:03 AM   cc: The Patient

## 2012-11-26 NOTE — Preoperative (Signed)
Beta Blockers   Reason not to administer Beta Blockers:Hold beta blocker due to other

## 2012-11-26 NOTE — Anesthesia Postprocedure Evaluation (Signed)
  Anesthesia Post-op Note  Patient: [name]  Procedure(s) Performed: Procedure(s) (LRB): COLONOSCOPY WITH PROPOFOL (N/A)  Patient Location: PACU  Anesthesia Type: MAC  Level of Consciousness: awake and alert   Airway and Oxygen Therapy: Patient Spontanous Breathing  Post-op Pain: mild  Post-op Assessment: Post-op Vital signs reviewed, Patient's Cardiovascular Status Stable, Respiratory Function Stable, Patent Airway and No signs of Nausea or vomiting  Last Vitals:  Filed Vitals:   11/26/12 1051  BP: 106/63  Temp: 36.4 C  Resp: 14    Post-op Vital Signs: stable   Complications: No apparent anesthesia complications

## 2012-11-27 ENCOUNTER — Encounter (HOSPITAL_COMMUNITY): Payer: Self-pay | Admitting: Internal Medicine

## 2012-12-05 ENCOUNTER — Telehealth: Payer: Self-pay | Admitting: Internal Medicine

## 2012-12-05 NOTE — Telephone Encounter (Signed)
[name] is having intense foot pain.  He said it is like needles sticking him.  He wants to go back on the Hydrocodone.

## 2012-12-07 MED ORDER — HYDROCODONE-APAP-DIETARY PROD 10-325 MG PO MISC: 1.0000 | Freq: Four times a day (QID) | ORAL | Status: DC

## 2012-12-07 MED ORDER — HYDROCODONE-ACETAMINOPHEN 10-325 MG PO TABS: 1.0000 | ORAL_TABLET | Freq: Four times a day (QID) | ORAL | Status: DC | PRN

## 2012-12-07 NOTE — Telephone Encounter (Signed)
Hydrocodone was called to [facility].

## 2012-12-07 NOTE — Telephone Encounter (Signed)
Hydrocodone/APAP 10/325 can be called to pharmacy Sig 1 q6, #90, 1 refill

## 2013-01-02 ENCOUNTER — Ambulatory Visit (INDEPENDENT_AMBULATORY_CARE_PROVIDER_SITE_OTHER): Payer: Medicare Other | Admitting: Internal Medicine

## 2013-01-02 ENCOUNTER — Encounter: Payer: Self-pay | Admitting: Internal Medicine

## 2013-01-02 VITALS — BP 132/78 | HR 70 | Temp 98.2°F

## 2013-01-02 DIAGNOSIS — E109 Type 1 diabetes mellitus without complications: Secondary | ICD-10-CM

## 2013-01-02 DIAGNOSIS — M545 Low back pain: Secondary | ICD-10-CM

## 2013-01-02 DIAGNOSIS — M25551 Pain in right hip: Secondary | ICD-10-CM

## 2013-01-02 DIAGNOSIS — M25559 Pain in unspecified hip: Secondary | ICD-10-CM

## 2013-01-02 MED ORDER — PREDNISONE (PAK) 10 MG PO TABS: 10.0000 mg | ORAL_TABLET | ORAL | Status: DC

## 2013-01-02 MED ORDER — MELOXICAM 15 MG PO TABS: 15.0000 mg | ORAL_TABLET | Freq: Every day | ORAL | Status: DC

## 2013-01-02 MED ORDER — CYCLOBENZAPRINE HCL 10 MG PO TABS: 10.0000 mg | ORAL_TABLET | Freq: Three times a day (TID) | ORAL | Status: DC | PRN

## 2013-01-02 NOTE — Patient Instructions (Addendum)
It was good to see you today. We have reviewed your prior records including labs and tests today Test(s) ordered today - Go to [place] (Radiology) department for these tests. Your results will be released to [place] (or called to you) after review, usually within 72hours after test completion. If any changes need to be made, you will be notified at that same time. To control pain, begin prednisone taper as discussed for the next 6 days. After completion of prednisone taper, beginning meloxicam one pill daily for arthritis symptoms Begin now Flexeril muscle relaxer one tablet 3 times daily as needed for spasm and pain Okay to use hydrocodone as needed every 6 hours for uncontrolled pain symptoms, refills provided Your prescription(s) have been submitted to your pharmacy. Please take as directed and contact our office if you believe you are having problem(s) with the medication(s). Call if pain symptoms unimproved in next 1-2 weeks for referral to specialist as discussed, call sooner if worse. Watch your sugars and call if over 200 in next few days - steroids will increase your sugars more than usual for next few days!

## 2013-01-02 NOTE — Progress Notes (Signed)
Subjective:    Patient ID: [name], male    DOB: 1955/11/01, 57 y.o.   MRN: [id number]  HPI  See CC - pain in L low back and R hip Denies radiation into foo On norco 10/325 x 3 week because of same - min improved pain worse in past 2 days Denies recent injury or overuse  Past Medical History  Diagnosis Date  . Diabetes mellitus     Has Hx of diabetic foot ulcer & peripheral neuropathy  . Hyperlipidemia   . HTN (hypertension)   . Morbid obesity   . History of PFTs 05/2009    Mild Obstructive defect  . CHF (congestive heart failure)     Presumed diastolic. Echo (06/07) w.EF 45%, severe posterior HK, mild LV hypertrophy, No further work-up of abnormal echo was done.  . OSA on CPAP   . Colon cancer 2007    s/p sigmoid colectomy  . Anemia      Review of Systems  Constitutional: Negative for fever and unexpected weight change.  Musculoskeletal: Positive for back pain and gait problem. Negative for joint swelling.  Skin: Negative for rash and wound.  Neurological: Negative for weakness and numbness.       Objective:   Physical Exam BP 132/78  Pulse 70  Temp(Src) 98.2 F (36.8 C) (Oral)  SpO2 95% Wt Readings from Last 3 Encounters:  11/26/12 435 lb (197.315 kg)  11/26/12 435 lb (197.315 kg)  11/08/12 437 lb (198.222 kg)   Constitutional:  He is morbidly obese, but appears well-developed and well-nourished. No distress.  Neck: Normal range of motion. Neck supple. No JVD present. No thyromegaly present.  Cardiovascular: Normal rate, regular rhythm and normal heart sounds.  No murmur heard. no BLE edema Pulmonary/Chest: Effort normal and breath sounds normal. No respiratory distress. no wheezes.  Abdominal: Soft. Bowel sounds are normal. Patient exhibits no distension. There is no tenderness.  Musculoskeletal: Back: limited range of motion of thoracic and lumbar spine because of pain and obesity. Mildly tender to palpation L paraspinal lumbar region and R groin.  Unable to test straight leg raise. DTR's are symmetrically intact. Sensation intact in all dermatomes of the lower extremities. Grossly normal strength to manual muscle testing. patient is able to walk with difficulty due to pain with WB RLE - ambulates with antalgic gait using RW (chronic). R hip - grossly normal ROM Neurological: he is alert and oriented to person, place, and time. No cranial nerve deficit. Coordination normal.  Skin: Skin is warm and dry.  No erythema or ulceration.   Lab Results  Component Value Date   WBC 5.6 06/23/2011   HGB 10.1* 06/23/2011   HCT 31.5* 06/23/2011   PLT 213.0 06/23/2011   GLUCOSE 194* 10/04/2012   CHOL 155 10/04/2012   TRIG 113.0 10/04/2012   HDL 38.20* 10/04/2012   LDLDIRECT 136.8 12/12/2006   LDLCALC 94 10/04/2012   ALT 14 10/04/2012   ALT 14 10/04/2012   AST 15 10/04/2012   AST 15 10/04/2012   NA 138 10/04/2012   K 4.5 10/04/2012   CL 104 10/04/2012   CREATININE 1.3 10/04/2012   BUN 16 10/04/2012   CO2 28 10/04/2012   TSH 0.94 06/23/2011   HGBA1C 11.4* 10/04/2012    DG L spine 09/18/08 - bilateral hip pins with advanced degenerative joint changes in the right hip, DDD     Assessment & Plan:   Acute L LBP and R hip pain (groin) x 2 days  Suspect  DDD/OA flare rather than radiculopathy based on hx and (limited) exam muscle spasm on exam   Check DG L spine and R hip now (due to painful WB) - at [facility] because of weight Treat with 6 day pred pack and flexeril TID After pred pak, start meloxicam 15 qd  erx done Continue norco 10 prn unrelieved pain - refill today If unimproved in next 2 weeks, will refer to ortho specialist - pt to call sooner if symptoms worse

## 2013-01-02 NOTE — Assessment & Plan Note (Signed)
Lab Results  Component Value Date   HGBA1C 11.4* 10/04/2012   subop control at baseline patient advised on anticipated increase in cbgs due to steroid effect for next several days - pt will call if cbg>200

## 2013-01-03 ENCOUNTER — Ambulatory Visit (HOSPITAL_COMMUNITY)
Admission: RE | Admit: 2013-01-03 | Discharge: 2013-01-03 | Disposition: A | Discharge status: Home / Self Care | Payer: Medicare Other | Source: Ambulatory Visit | Attending: Internal Medicine | Admitting: Internal Medicine

## 2013-01-03 DIAGNOSIS — M545 Low back pain, unspecified: Secondary | ICD-10-CM

## 2013-01-03 DIAGNOSIS — M25559 Pain in unspecified hip: Secondary | ICD-10-CM | POA: Insufficient documentation

## 2013-01-03 DIAGNOSIS — M25551 Pain in right hip: Secondary | ICD-10-CM

## 2013-01-10 ENCOUNTER — Telehealth: Payer: Self-pay | Admitting: Internal Medicine

## 2013-01-10 NOTE — Telephone Encounter (Signed)
Patient Information:  Caller Name: Fernandez  Phone: [phone]  Patient: [name], [name]  Gender: Male  DOB: 1956-01-23  Age: 57 Years  PCP: Adella Hare (Adults only)  Office Follow Up:  Does the office need to follow up with this patient?: No  Instructions For The Office: N/A   Symptoms  Reason For Call & Symptoms: Pt states he was seen in the office on Monday 01/07/13 and advised to call if not any better. Abdominal pain, cramping, nausea.  Reviewed Health History In EMR: Yes  Reviewed Medications In EMR: Yes  Reviewed Allergies In EMR: Yes  Reviewed Surgeries / Procedures: Yes  Date of Onset of Symptoms: 01/07/2013  Guideline(s) Used:  Abdominal Pain - Male  Disposition Per Guideline:   See Today in Office  Reason For Disposition Reached:   Mild pain that comes and goes (cramps) lasts > 24 hours  Advice Given:  Call Back If:  You become worse.  Patient Will Follow Care Advice:  YES  Appointment Scheduled:  01/11/2013 08:00:00 Appointment Scheduled Provider:  Scarlette Calico (Adults only)

## 2013-01-11 ENCOUNTER — Other Ambulatory Visit (INDEPENDENT_AMBULATORY_CARE_PROVIDER_SITE_OTHER): Payer: Medicare Other

## 2013-01-11 ENCOUNTER — Encounter: Payer: Self-pay | Admitting: Internal Medicine

## 2013-01-11 ENCOUNTER — Ambulatory Visit (INDEPENDENT_AMBULATORY_CARE_PROVIDER_SITE_OTHER): Payer: Medicare Other | Admitting: Internal Medicine

## 2013-01-11 VITALS — BP 106/62 | HR 93 | Temp 98.5°F | Resp 16 | Ht 72.0 in | Wt >= 6400 oz

## 2013-01-11 DIAGNOSIS — R10815 Periumbilic abdominal tenderness: Secondary | ICD-10-CM

## 2013-01-11 DIAGNOSIS — I10 Essential (primary) hypertension: Secondary | ICD-10-CM

## 2013-01-11 DIAGNOSIS — N400 Enlarged prostate without lower urinary tract symptoms: Secondary | ICD-10-CM | POA: Insufficient documentation

## 2013-01-11 DIAGNOSIS — N402 Nodular prostate without lower urinary tract symptoms: Secondary | ICD-10-CM

## 2013-01-11 DIAGNOSIS — E109 Type 1 diabetes mellitus without complications: Secondary | ICD-10-CM

## 2013-01-11 DIAGNOSIS — N182 Chronic kidney disease, stage 2 (mild): Secondary | ICD-10-CM

## 2013-01-11 DIAGNOSIS — D510 Vitamin B12 deficiency anemia due to intrinsic factor deficiency: Secondary | ICD-10-CM | POA: Insufficient documentation

## 2013-01-11 DIAGNOSIS — E039 Hypothyroidism, unspecified: Secondary | ICD-10-CM | POA: Insufficient documentation

## 2013-01-11 DIAGNOSIS — C189 Malignant neoplasm of colon, unspecified: Secondary | ICD-10-CM

## 2013-01-11 LAB — CBC WITH DIFFERENTIAL/PLATELET
Basophils Relative: 0.3 % (ref 0.0–3.0)
Eosinophils Relative: 2.2 % (ref 0.0–5.0)
HCT: 35.7 % — ABNORMAL LOW (ref 39.0–52.0)
Hemoglobin: 11.4 g/dL — ABNORMAL LOW (ref 13.0–17.0)
Lymphs Abs: 3 10*3/uL (ref 0.7–4.0)
MCV: 82.6 fl (ref 78.0–100.0)
Monocytes Absolute: 0.5 10*3/uL (ref 0.1–1.0)
Neutrophils Relative %: 56.7 % (ref 43.0–77.0)
RBC: 4.32 Mil/uL (ref 4.22–5.81)
WBC: 8.8 10*3/uL (ref 4.5–10.5)

## 2013-01-11 LAB — COMPREHENSIVE METABOLIC PANEL
Albumin: 3.4 g/dL — ABNORMAL LOW (ref 3.5–5.2)
Alkaline Phosphatase: 86 U/L (ref 39–117)
BUN: 37 mg/dL — ABNORMAL HIGH (ref 6–23)
Glucose, Bld: 472 mg/dL — ABNORMAL HIGH (ref 70–99)
Potassium: 4.5 mEq/L (ref 3.5–5.1)

## 2013-01-11 LAB — IBC PANEL: Transferrin: 210.8 mg/dL — ABNORMAL LOW (ref 212.0–360.0)

## 2013-01-11 LAB — URINALYSIS, ROUTINE W REFLEX MICROSCOPIC
Hgb urine dipstick: NEGATIVE
Specific Gravity, Urine: 1.02 (ref 1.000–1.030)
Total Protein, Urine: NEGATIVE
Urine Glucose: >=1000

## 2013-01-11 LAB — FERRITIN: Ferritin: 173.2 ng/mL (ref 22.0–322.0)

## 2013-01-11 LAB — AMYLASE: Amylase: 44 U/L (ref 27–131)

## 2013-01-11 LAB — LIPASE: Lipase: 19 U/L (ref 11.0–59.0)

## 2013-01-11 LAB — PSA: PSA: 0.71 ng/mL (ref 0.10–4.00)

## 2013-01-11 LAB — VITAMIN B12: Vitamin B-12: 290 pg/mL (ref 211–911)

## 2013-01-11 MED ORDER — LEVOTHYROXINE SODIUM 50 MCG PO TABS: 50.0000 ug | ORAL_TABLET | Freq: Every day | ORAL | Status: DC

## 2013-01-11 MED ORDER — INSULIN DETEMIR 100 UNIT/ML ~~LOC~~ SOLN: 60.0000 [IU] | Freq: Two times a day (BID) | SUBCUTANEOUS | Status: DC

## 2013-01-11 NOTE — Assessment & Plan Note (Signed)
He has no s's of blood loss I will recheck his CBC and will look at his vitamin levels

## 2013-01-11 NOTE — Assessment & Plan Note (Addendum)
I will check his A1C and c-peptide level and will monitor his renal function This is a complex scenario so I have asked him to see ENDO about his DM Today I will increase his levemir dose to  60 u BID Late note -his Creat has bumped up so for now I have asked him to stop metformin

## 2013-01-11 NOTE — Patient Instructions (Signed)
Abdominal Pain  Abdominal pain can be caused by many things. Your caregiver decides the seriousness of your pain by an examination and possibly blood tests and X-rays. Many cases can be observed and treated at home. Most abdominal pain is not caused by a disease and will probably improve without treatment. However, in many cases, more time must pass before a clear cause of the pain can be found. Before that point, it may not be known if you need more testing, or if hospitalization or surgery is needed.  HOME CARE INSTRUCTIONS   · Do not take laxatives unless directed by your caregiver.  · Take pain medicine only as directed by your caregiver.  · Only take over-the-counter or prescription medicines for pain, discomfort, or fever as directed by your caregiver.  · Try a clear liquid diet (broth, tea, or water) for as long as directed by your caregiver. Slowly move to a bland diet as tolerated.  SEEK IMMEDIATE MEDICAL CARE IF:   · The pain does not go away.  · You have a fever.  · You keep throwing up (vomiting).  · The pain is felt only in portions of the abdomen. Pain in the right side could possibly be appendicitis. In an adult, pain in the left lower portion of the abdomen could be colitis or diverticulitis.  · You pass bloody or black tarry stools.  MAKE SURE YOU:   · Understand these instructions.  · Will watch your condition.  · Will get help right away if you are not doing well or get worse.  Document Released: 06/15/2005 Document Revised: 11/28/2011 Document Reviewed: 04/23/2008  Exit[employer]® Patient Information ©2013 [employer].

## 2013-01-11 NOTE — Assessment & Plan Note (Signed)
PSA today

## 2013-01-11 NOTE — Progress Notes (Signed)
Subjective:    Patient ID: [name], male    DOB: 02-16-1956, 57 y.o.   MRN: [id number]  Abdominal Pain This is a new problem. Episode onset: 2 weeks ago. The onset quality is gradual. The problem occurs intermittently. The problem has been unchanged. The pain is located in the periumbilical region. The pain is at a severity of 1/10. The pain is mild. The quality of the pain is aching and sharp. The abdominal pain does not radiate. Associated symptoms include constipation, frequency and nausea. Pertinent negatives include no anorexia, arthralgias, belching, diarrhea, dysuria, fever, flatus, headaches, hematochezia, hematuria, melena, myalgias, vomiting or weight loss. Nothing aggravates the pain. The pain is relieved by bowel movements. He has tried oral narcotic analgesics for the symptoms. The treatment provided moderate relief. His past medical history is significant for abdominal surgery and colon cancer. There is no history of Crohn's disease, gallstones, GERD, irritable bowel syndrome, pancreatitis, PUD or ulcerative colitis.      Review of Systems  Constitutional: Negative for fever, chills, weight loss, diaphoresis, activity change, appetite change, fatigue and unexpected weight change.  HENT: Negative.   Eyes: Negative.   Respiratory: Negative.  Negative for cough, chest tightness, shortness of breath, wheezing and stridor.   Cardiovascular: Negative.  Negative for chest pain, palpitations and leg swelling.  Gastrointestinal: Positive for nausea, abdominal pain and constipation. Negative for vomiting, diarrhea, melena, hematochezia, abdominal distention, anal bleeding, rectal pain, anorexia and flatus.  Endocrine: Positive for polydipsia, polyphagia and polyuria. Negative for cold intolerance and heat intolerance.  Genitourinary: Positive for frequency. Negative for dysuria, urgency, hematuria, flank pain, decreased urine volume, discharge, penile swelling, scrotal swelling, enuresis,  difficulty urinating, genital sores, penile pain and testicular pain.  Musculoskeletal: Positive for back pain (improved since his last visit). Negative for myalgias, joint swelling, arthralgias and gait problem.  Skin: Negative.   Allergic/Immunologic: Negative.   Neurological: Negative.  Negative for dizziness, tremors, weakness, light-headedness, numbness and headaches.  Hematological: Negative.  Negative for adenopathy. Does not bruise/bleed easily.  Psychiatric/Behavioral: Negative.        Objective:   Physical Exam  Vitals reviewed. Constitutional: He is oriented to person, place, and time. He appears well-developed and well-nourished.  Non-toxic appearance. No distress.  HENT:  Head: Normocephalic and atraumatic. No trismus in the jaw.  Mouth/Throat: Oropharynx is clear and moist. Mucous membranes are pale, not dry and not cyanotic. No oral lesions. No edematous. No oropharyngeal exudate, posterior oropharyngeal edema, posterior oropharyngeal erythema or tonsillar abscesses.  Eyes: Conjunctivae are normal. Right eye exhibits no discharge. Left eye exhibits no discharge. No scleral icterus.  Neck: Normal range of motion. Neck supple. No JVD present. No tracheal deviation present. No thyromegaly present.  Cardiovascular: Normal rate, regular rhythm, normal heart sounds and intact distal pulses.  Exam reveals no gallop and no friction rub.   No murmur heard. Pulmonary/Chest: Effort normal and breath sounds normal. No stridor. No respiratory distress. He has no wheezes. He has no rales. He exhibits no tenderness.  Abdominal: Soft. Normal appearance and bowel sounds are normal. He exhibits no shifting dullness, no distension, no pulsatile liver, no fluid wave, no abdominal bruit, no ascites, no pulsatile midline mass and no mass. There is no hepatosplenomegaly, splenomegaly or hepatomegaly. There is tenderness in the periumbilical area. There is no rigidity, no rebound, no guarding, no CVA  tenderness, no tenderness at McBurney's point and negative Murphy's sign. No hernia. Hernia confirmed negative in the ventral area, confirmed negative in the right  inguinal area and confirmed negative in the left inguinal area.  Genitourinary: Rectum normal, testes normal and penis normal. Rectal exam shows no external hemorrhoid, no internal hemorrhoid, no fissure, no mass, no tenderness and anal tone normal. Guaiac negative stool. Prostate is enlarged (right lobe slightly larger than left lobe). Prostate is not tender. Right testis shows no mass, no swelling and no tenderness. Right testis is descended. Left testis shows no mass, no swelling and no tenderness. Left testis is descended. Circumcised. No phimosis, paraphimosis, hypospadias, penile erythema or penile tenderness. No discharge found.  He has Bilateral testicular atrophy  Musculoskeletal: Normal range of motion. He exhibits no tenderness.  Lymphadenopathy:    He has no cervical adenopathy.       Right: No inguinal adenopathy present.       Left: No inguinal adenopathy present.  Neurological: He is alert and oriented to person, place, and time. He has normal reflexes.  Skin: Skin is warm and dry. No rash noted. He is not diaphoretic. No erythema. No pallor.  Psychiatric: He has a normal mood and affect. His behavior is normal. Judgment and thought content normal.      Lab Results  Component Value Date   WBC 5.6 06/23/2011   HGB 10.1* 06/23/2011   HCT 31.5* 06/23/2011   PLT 213.0 06/23/2011   GLUCOSE 194* 10/04/2012   CHOL 155 10/04/2012   TRIG 113.0 10/04/2012   HDL 38.20* 10/04/2012   LDLDIRECT 136.8 12/12/2006   LDLCALC 94 10/04/2012   ALT 14 10/04/2012   ALT 14 10/04/2012   AST 15 10/04/2012   AST 15 10/04/2012   NA 138 10/04/2012   K 4.5 10/04/2012   CL 104 10/04/2012   CREATININE 1.3 10/04/2012   BUN 16 10/04/2012   CO2 28 10/04/2012   TSH 0.94 06/23/2011   HGBA1C 11.4* 10/04/2012     Dg Lumbar Spine 2-3 Views  01/03/2013   *RADIOLOGY REPORT*  Clinical Data: Low back and right hip pain.  No recent injury.  LUMBAR SPINE - 2-3 VIEW  Comparison: 09/18/2008  Findings: A right upper quadrant reservoir which is likely related to a gastric banding procedure. Five lumbar type vertebral bodies. Proximal femoral fixation bilaterally with degenerative changes about the right hip.  Incompletely imaged. Maintenance of vertebral body height and alignment.  Lower thoracic spondylosis.  IMPRESSION: No acute osseous abnormality.   Original Report Authenticated By: Abigail Miyamoto, M.D.    Dg Hip Complete Right  01/03/2013  *RADIOLOGY REPORT*  Clinical Data: Low back pain with right hip pain.  No recent injury.  Remote proximal femoral fixation.  RIGHT HIP - COMPLETE 2+ VIEW  Comparison: 09/18/2008  Findings: AP view pelvis and AP/frog-leg views of the right hip. Sacroiliac joints are symmetric.  Combination of developmental or post-traumatic and degenerative changes about the right hip with joint space narrowing and foreshortening of the right femoral neck. This is similar.  Bilateral proximal femoral fixation, without acute hardware complication.  IMPRESSION: Bilateral proximal femoral fixation.  Similar appearance of severe degenerative changes of the right hip.  No acute hardware complication.   Original Report Authenticated By: Abigail Miyamoto, M.D.   Assessment & Plan:

## 2013-01-11 NOTE — Assessment & Plan Note (Signed)
I do not see an acute abd process I have ordered a plain film to see if he has a large stool burden, ileus, sbo I have also ordered labs to see if there is any organic pathology to explain his pain

## 2013-01-11 NOTE — Assessment & Plan Note (Addendum)
He has had a decline in his renal function (this may explain some of the abd pain as it relates to metforim) I have asked him to stop metformin and nsaids Will bring him back in the next week for a recheck on the renal function

## 2013-01-12 LAB — C-PEPTIDE: C-Peptide: 3.01 ng/mL (ref 0.80–3.90)

## 2013-01-16 ENCOUNTER — Encounter: Payer: Self-pay | Admitting: Internal Medicine

## 2013-01-16 ENCOUNTER — Ambulatory Visit (INDEPENDENT_AMBULATORY_CARE_PROVIDER_SITE_OTHER): Payer: Medicare Other | Admitting: Internal Medicine

## 2013-01-16 ENCOUNTER — Other Ambulatory Visit (INDEPENDENT_AMBULATORY_CARE_PROVIDER_SITE_OTHER): Payer: Medicare Other

## 2013-01-16 VITALS — BP 118/72 | HR 96 | Temp 97.4°F | Resp 16 | Ht 72.0 in | Wt >= 6400 oz

## 2013-01-16 DIAGNOSIS — N179 Acute kidney failure, unspecified: Secondary | ICD-10-CM

## 2013-01-16 DIAGNOSIS — N189 Chronic kidney disease, unspecified: Secondary | ICD-10-CM | POA: Insufficient documentation

## 2013-01-16 DIAGNOSIS — N2889 Other specified disorders of kidney and ureter: Secondary | ICD-10-CM

## 2013-01-16 DIAGNOSIS — E109 Type 1 diabetes mellitus without complications: Secondary | ICD-10-CM

## 2013-01-16 DIAGNOSIS — I10 Essential (primary) hypertension: Secondary | ICD-10-CM

## 2013-01-16 DIAGNOSIS — N182 Chronic kidney disease, stage 2 (mild): Secondary | ICD-10-CM

## 2013-01-16 DIAGNOSIS — E039 Hypothyroidism, unspecified: Secondary | ICD-10-CM

## 2013-01-16 LAB — BASIC METABOLIC PANEL
CO2: 28 mEq/L (ref 19–32)
Calcium: 8.9 mg/dL (ref 8.4–10.5)
Creatinine, Ser: 1.9 mg/dL — ABNORMAL HIGH (ref 0.4–1.5)
GFR: 49.02 mL/min — ABNORMAL LOW (ref >60.00)
Sodium: 136 mEq/L (ref 135–145)

## 2013-01-16 NOTE — Assessment & Plan Note (Signed)
His BP is well controlled 

## 2013-01-16 NOTE — Assessment & Plan Note (Signed)
I will recheck his renal function today 

## 2013-01-16 NOTE — Assessment & Plan Note (Signed)
His UA shows a very bland urine with glucose so I don't think he has an active renal disease such as nephritis, vascular complication, etc I think he has metabolic renal disease (DM, HTN, meds, diuretics) He will stop metformin for now and will avoid nsaids I will recheck his renal function today and have asked him to get an U/S don of his kidneys to look for atrophy, enlargement, assymetry, hydronephrosis, etc.

## 2013-01-16 NOTE — Assessment & Plan Note (Signed)
ENDO referral

## 2013-01-16 NOTE — Progress Notes (Signed)
Subjective:    Patient ID: [name], male    DOB: [birth year], 57 y.o.   MRN: [id number]  Hypertension This is a chronic problem. The current episode started more than 1 year ago. The problem has been gradually improving since onset. The problem is controlled. Pertinent negatives include no anxiety, blurred vision, chest pain, headaches, malaise/fatigue, neck pain, orthopnea, palpitations, peripheral edema, PND, shortness of breath or sweats. Risk factors for coronary artery disease include obesity and diabetes mellitus. Past treatments include ACE inhibitors and diuretics. Compliance problems include exercise and diet.  Hypertensive end-organ damage includes kidney disease. Identifiable causes of hypertension include chronic renal disease.      Review of Systems  Constitutional: Negative.  Negative for fever, chills, malaise/fatigue, diaphoresis, activity change, appetite change, fatigue and unexpected weight change.  HENT: Negative.  Negative for neck pain.   Eyes: Negative.  Negative for blurred vision.  Respiratory: Negative.  Negative for apnea, cough, choking, chest tightness, shortness of breath, wheezing and stridor.   Cardiovascular: Negative.  Negative for chest pain, palpitations, orthopnea, leg swelling and PND.  Gastrointestinal: Negative.  Negative for nausea, vomiting, abdominal pain, diarrhea, constipation and blood in stool.  Endocrine: Negative.   Genitourinary: Negative.  Negative for urgency, frequency, hematuria, flank pain, decreased urine volume and difficulty urinating.  Musculoskeletal: Negative.  Negative for myalgias, back pain, joint swelling, arthralgias and gait problem.  Skin: Negative.  Negative for color change, pallor, rash and wound.  Allergic/Immunologic: Negative.   Neurological: Negative for dizziness, tremors, seizures, speech difficulty, weakness, light-headedness and headaches.  Hematological: Negative.   Psychiatric/Behavioral: Negative.         Objective:   Physical Exam  Vitals reviewed. Constitutional: He is oriented to person, place, and time. He appears well-developed and well-nourished. No distress.  HENT:  Head: Normocephalic and atraumatic.  Mouth/Throat: Oropharynx is clear and moist. No oropharyngeal exudate.  Eyes: Conjunctivae are normal. Right eye exhibits no discharge. Left eye exhibits no discharge. No scleral icterus.  Neck: Normal range of motion. Neck supple. No JVD present. No tracheal deviation present. No thyromegaly present.  Cardiovascular: Normal rate, regular rhythm, normal heart sounds and intact distal pulses.  Exam reveals no gallop and no friction rub.   No murmur heard. Pulmonary/Chest: Effort normal and breath sounds normal. No stridor. No respiratory distress. He has no wheezes. He has no rales. He exhibits no tenderness.  Abdominal: Soft. Bowel sounds are normal. He exhibits no distension and no mass. There is no tenderness. There is no rebound and no guarding.  Musculoskeletal: Normal range of motion. He exhibits no edema and no tenderness.  Lymphadenopathy:    He has no cervical adenopathy.  Neurological: He is oriented to person, place, and time.  Skin: Skin is warm and dry. No rash noted. He is not diaphoretic. No erythema. No pallor.  Psychiatric: He has a normal mood and affect. His behavior is normal. Judgment and thought content normal.      Lab Results  Component Value Date   WBC 8.8 01/11/2013   HGB 11.4* 01/11/2013   HCT 35.7* 01/11/2013   PLT 229.0 01/11/2013   GLUCOSE 472* 01/11/2013   CHOL 155 10/04/2012   TRIG 113.0 10/04/2012   HDL 38.20* 10/04/2012   LDLDIRECT 136.8 12/12/2006   LDLCALC 94 10/04/2012   ALT 18 01/11/2013   AST 15 01/11/2013   NA 128* 01/11/2013   K 4.5 01/11/2013   CL 91* 01/11/2013   CREATININE 2.1* 01/11/2013   BUN 37* 01/11/2013  CO2 27 01/11/2013   TSH 10.51* 01/11/2013   PSA 0.71 01/11/2013   HGBA1C 13.7* 01/11/2013     Results for JUANJESUS, DRECHSLER (MRN  [id number]) as of 01/16/2013 09:20  Ref. Range 01/11/2013 08:43  Color, Urine Latest Range: Yellow;Lt. Yellow  YELLOW  APPearance Latest Range: Clear  CLEAR  Specific Gravity, Urine Latest Range: 1.000-1.030  1.020  pH Latest Range: 5.0-8.0  6.0  Urine Glucose Latest Range: Negative  >=1000  Bilirubin Urine Latest Range: Negative  NEGATIVE  Ketones, ur Latest Range: Negative  NEGATIVE  Urobilinogen, UA Latest Range: 0.0 - 1.0  0.2  Nitrite Latest Range: Negative  NEGATIVE  Leukocytes, UA Latest Range: Negative  NEGATIVE  Hgb urine dipstick Latest Range: Negative  NEGATIVE  WBC, UA Latest Range: 0-2/hpf  0-2/hpf  Squamous Epithelial / LPF Latest Range: Rare(0-4/hpf)  Rare(0-4/hpf)   Assessment & Plan:

## 2013-01-16 NOTE — Assessment & Plan Note (Signed)
He will start synthroid

## 2013-01-16 NOTE — Patient Instructions (Signed)
Chronic Renal Insufficiency Chronic renal insufficiency (also called kidney failure) occurs when there is kidney damage done. The damage prevents the kidneys from working like they should.  The kidneys do many important things. They:  Filter waste out of the blood.  Regulate the amount of water and various salts in the blood stream.  Produce chemicals that:  Prompt the bone marrow to make red blood cells.  Regulate blood pressure.  Keep calcium in balance throughout the bones and the body. When the kidneys are damaged, they can no longer filter waste products out of the blood. These substances build up in the blood, causing illness.  CAUSES   Diabetes.  High blood pressure.  Glomerular diseases: Conditions that damage the tiny blood vessels (glomeruli) within the kidneys, such as:  Membranous nephropathy.  IgA nephropathy.  Focal segmental glomerulosclerosis.  Poisons (such as overdoses or misuse of acetaminophen or NSAIDS, or exposure to other toxic substances).  Kidney injuries.  Kidney cancer or cancer that spreads to the kidney.  Medications such as NSAIDs. These problems are rare.  Kidney stones.  Alport disease.  Polycystic kidneys. SYMPTOMS  Most people do not notice symptoms of kidney failure until their kidney function drops below about 30-40% of normal. Symptoms can include:  Weakness.  Tiredness.  Frequent urination.  Intense need to urinate.  Excess bruising.  Low urine production.  Blood in the urine.  Pain in the kidney area.  Feeling sick to your stomach (nausea).  Vomiting.  Unusual bleeding.  Numbness in hands and feet.  Swelling in legs, arms and face.  Confusion. DIAGNOSIS  Your caregiver will look for signs of kidney failure. Tests to diagnose kidney failure may include:  Urine tests: May reveal the presence of blood, protein or sugar.  Blood tests: May show low red blood cell count (anemia) or high levels of waste  products (BUN and creatinine) that are normally filtered out of the bloodstream by the kidneys.  Imaging tests  These are tests that create pictures of the organs inside the abdomen, such as the kidneys. They may reveal masses growing in the kidneys or blockages to the flow of urine. Possible imaging tests may include:  Ultrasound.  CT scan.  MRI.  Intravenous pyelogram or IVP. This is a test that involves injecting dye into the bloodstream and then taking a series of x-rays of the kidneys. This allows the kidneys and other parts of the urinary system to be viewed more clearly.  Kidney biopsy  A small sample of kidney is removed using a special needle. The sample is examined for abnormalities under a microscope. TREATMENT  Chronic kidney failure cannot usually be cured. The various symptoms are treated, and measures are taken to avoid further kidney damage. Treatment for mild to moderate kidney failure may include:  Medication for high blood pressure.  Good control of diabetes.  Medication and diet change to improve anemia.  A low-sodium, low-potassium, low-protein and/or low-cholesterol diet.  Limiting the quantity of liquids in the diet. Treatment for more severe kidney failure may require:  Dialysis  Mechanical methods of filtering the blood.  Kidney transplant  An operation that removes the diseased kidney and replaces it with a donated kidney. HOME CARE INSTRUCTIONS   Take medication as told by your caregiver.  Quit smoking if you are a smoker. Talk to your caregiver about a smoking cessation program.  Follow your prescribed diet.  If you are prescribed vitamins, take them as told. SEEK IMMEDIATE MEDICAL CARE IF:  You start to produce less urine.  You notice blood in your urine.  You have increased pain.  You have increased weakness, fatigue or confusion.  You notice new swelling.  You develop a fever.  You feel that you are having side effects of medicines  prescribed. Document Released: 06/14/2008 Document Revised: 11/28/2011 Document Reviewed: 09/27/2010 [facility] Patient Information 2013 [place].

## 2013-01-17 ENCOUNTER — Ambulatory Visit: Payer: Medicare Other | Admitting: Endocrinology

## 2013-01-21 ENCOUNTER — Ambulatory Visit
Admission: RE | Admit: 2013-01-21 | Discharge: 2013-01-21 | Disposition: A | Discharge status: Home / Self Care | Payer: Medicare Other | Source: Ambulatory Visit | Attending: Internal Medicine | Admitting: Internal Medicine

## 2013-01-21 DIAGNOSIS — R10815 Periumbilic abdominal tenderness: Secondary | ICD-10-CM

## 2013-01-21 DIAGNOSIS — N179 Acute kidney failure, unspecified: Secondary | ICD-10-CM

## 2013-01-23 ENCOUNTER — Telehealth: Payer: Self-pay

## 2013-01-23 NOTE — Telephone Encounter (Signed)
Phone call from pt requesting his xray and u/s results. Please advise.

## 2013-01-23 NOTE — Telephone Encounter (Signed)
Phone call back to pt letting him know per doctor results were okay.

## 2013-01-23 NOTE — Telephone Encounter (Signed)
The results were ok

## 2013-01-29 ENCOUNTER — Ambulatory Visit (INDEPENDENT_AMBULATORY_CARE_PROVIDER_SITE_OTHER): Payer: Medicare Other | Admitting: Endocrinology

## 2013-01-29 ENCOUNTER — Encounter: Payer: Self-pay | Admitting: Endocrinology

## 2013-01-29 VITALS — BP 130/70 | HR 88 | Ht 72.0 in | Wt >= 6400 oz

## 2013-01-29 DIAGNOSIS — Z9884 Bariatric surgery status: Secondary | ICD-10-CM

## 2013-01-29 MED ORDER — TERBINAFINE HCL 250 MG PO TABS: 250.0000 mg | ORAL_TABLET | Freq: Every day | ORAL | Status: DC

## 2013-01-29 MED ORDER — GLUCOSE BLOOD VI STRP: 1.0000 | ORAL_STRIP | Freq: Two times a day (BID) | Status: DC

## 2013-01-29 NOTE — Patient Instructions (Addendum)
good diet and exercise habits significanly improve the control of your diabetes.  please let me know if you wish to be referred to a dietician, or for weight-loss surgery.  high blood sugar is very risky to your health.  you should see an eye doctor every year.  You are at higher than average risk for pneumonia and hepatitis-B.  You should be vaccinated against both.   controlling your blood pressure and cholesterol drastically reduces the damage diabetes does to your body.  this also applies to quitting smoking.  please discuss these with your doctor.  you should take an aspirin every day, unless you have been advised by a doctor not to. check your blood sugar twice a day.  vary the time of day when you check, between before the 3 meals, and at bedtime.  also check if you have symptoms of your blood sugar being too high or too low.  please keep a record of the readings and bring it to your next appointment here.  please call us sooner if your blood sugar goes below 70, or if you have a lot of readings over 200.   we will need to take this complex situation in stages.   For now, please take all of the insulin in the morning.   Here is a new meter.  i have sent a prescription to your pharmacy, for strips.  Please come back for a follow-up appointment in 2 weeks. Refer to a "lap-band" specialist for follow-up.  you will receive a phone call, about a day and time for an appointment.   i have sent a prescription to your pharmacy, for a pill for the toenail fungus.

## 2013-01-29 NOTE — Progress Notes (Signed)
Subjective:    Patient ID: [name], male    DOB: 01-21-1956, 57 y.o.   MRN: [id number]  HPI pt states 10 years h/o dm, complicated by peripheral sensory neuropathy, proliferative retinopathy, and renal insufficiency.   he has been on insulin x 3 years.  pt says his diet is "average," and exercise is limited by health problems.  Pt reports few years of moderate numbness of the feet, but no assoc pain. levemir was recently increased to 60 units twice a day.  He says cbg's vary from 120-300.  It is in general higher as the day goes on.   He had gastric banding surgery in 2008, at [facility].  He lost 200 lbs, but has since regained 30 lbs of that.   Past Medical History  Diagnosis Date  . Diabetes mellitus     Has Hx of diabetic foot ulcer & peripheral neuropathy  . Hyperlipidemia   . HTN (hypertension)   . Morbid obesity   . History of PFTs 05/2009    Mild Obstructive defect  . CHF (congestive heart failure)     Presumed diastolic. Echo (06/07) w.EF 45%, severe posterior HK, mild LV hypertrophy, No further work-up of abnormal echo was done.  . OSA on CPAP   . Colon cancer 2007    s/p sigmoid colectomy  . Anemia     Past Surgical History  Procedure Laterality Date  . Sigmoid colectomy  10/2005    [facility]  . Corneal transplant  [facility]    '80/Right  '84/Left  . Bariatric surgery  12/2009    Lap Band/At [facility]  . Hip surgery      bilateral  . Colonoscopy  multiple  . Tonsillectomy    . Colonoscopy with propofol N/A 11/26/2012    Procedure: COLONOSCOPY WITH PROPOFOL;  Surgeon: Gatha Mayer, MD;  Location: [facility];  Service: Endoscopy;  Laterality: N/A;  may need pre appt. with anesthesia due to morbid obesity    History   Social History  . Marital Status: Legally Separated    Spouse Name: N/A    Number of Children: 0  . Years of Education: N/A   Occupational History  . Food [employer]    Social History Main Topics  . Smoking status: Never Smoker   . Smokeless  tobacco: Never Used  . Alcohol Use: No     Comment: Only Occasional.  . Drug Use: No  . Sexually Active: Not Currently   Other Topics Concern  . 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   In new relationship (04/2009) - girlfriend helps taste for cooking business and helps monitor for ulcers.   Regular Exercise- No          Current Outpatient Prescriptions on File Prior to Visit  Medication Sig Dispense Refill  . aspirin EC 81 MG tablet Take 1 tablet (81 mg total) by mouth daily.      . bisoprolol (ZEBETA) 10 MG tablet Take 10 mg by mouth every morning.      . cycloSPORINE (RESTASIS) 0.05 % ophthalmic emulsion Place 1 drop into both eyes 2 (two) times daily.  0.4 mL  5  . dicyclomine (BENTYL) 20 MG tablet  Take 1 tablet (20 mg total) by mouth every 6 (six) hours as needed (abdominal pain).  60 tablet  0  . furosemide (LASIX) 40 MG tablet Take 40 mg by mouth 2 (two) times daily as needed (for fluid).      Marland Kitchen HYDROcodone-acetaminophen (NORCO) 10-325 MG per tablet Take 1 tablet by mouth every 6 (six) hours as needed for pain.  90 tablet  1  . insulin detemir (LEVEMIR FLEXPEN) 100 UNIT/ML injection Inject 0.6 mLs (60 Units total) into the skin 2 (two) times daily.  15 mL  11  . levothyroxine (SYNTHROID, LEVOTHROID) 50 MCG tablet Take 1 tablet (50 mcg total) by mouth daily.  90 tablet  1  . lisinopril (PRINIVIL,ZESTRIL) 40 MG tablet Take 40 mg by mouth every morning.      . Na Sulfate-K Sulfate-Mg Sulf (SUPREP BOWEL PREP) SOLN Use as directed  2 Bottle  0  . polyethylene glycol powder (GLYCOLAX/MIRALAX) powder Take 17 g by mouth daily as needed (for constipation).       . simvastatin (ZOCOR) 40 MG tablet Take 1 tablet (40 mg total) by mouth at bedtime.  90 tablet  3    No current facility-administered medications on file prior to visit.    Allergies  Allergen Reactions  . Shellfish Allergy     Family History  Problem Relation Age of Onset  . Hepatitis Mother     hepatitis C  . Heart attack Brother 32  . Cancer Neg Hx     colon or prostate  . Diabetes Mother   . Diabetes Father     BP 130/70  Pulse 88  Ht 6' (1.829 m)  Wt 425 lb 14.4 oz (193.187 kg)  BMI 57.75 kg/m2  SpO2 94%  Review of Systems denies weight loss, blurry vision, headache, chest pain, sob, n/v, cramps, excessive diaphoresis, memory loss, depression, ED sxs, rhinorrhea, and easy bruising.  He reports urinary frequency.       Objective:   Physical Exam VS: see vs page GEN: no distress.  Morbid obesity. HEAD: head: no deformity eyes: no periorbital swelling, no proptosis external nose and ears are normal mouth: no lesion seen NECK: supple, thyroid is not enlarged CHEST WALL: no deformity.  LUNGS:  Clear to auscultation. BREASTS:  bilat pseudogynecomastia.   CV: reg rate and rhythm, no murmur ABD: abdomen is soft, nontender.  no hepatosplenomegaly.  not distended.  no hernia.   MUSCULOSKELETAL: muscle bulk and strength are grossly normal.  no obvious joint swelling.  gait is normal and steady PULSES: no carotid bruit NEURO:  cn 2-12 grossly intact.   readily moves all 4's.   SKIN:  Normal texture and temperature.  No rash or suspicious lesion is visible.   NODES:  None palpable at the neck PSYCH: alert, oriented x3.  Does not appear anxious nor depressed.   Lab Results  Component Value Date   HGBA1C 13.7* 01/11/2013      Assessment & Plan:  DM: he needs increased rx. bariatric surgery status.  He would benefit from f/u. Numbness, prob due to DM

## 2013-02-01 ENCOUNTER — Telehealth: Payer: Self-pay

## 2013-02-01 NOTE — Telephone Encounter (Signed)
If no fever or pus, ok to wait until Monday.  Could be seen per Rollene Fare today, or Sat clinic tomorrow if he wants

## 2013-02-01 NOTE — Telephone Encounter (Signed)
Pt calls stating he is diabetic. Last tetanus injection was 10.4.12. He has a splinter in his foot this morning that he did manage to get out but now he has a big black spot in that area. He does not see the wound center until Monday. Please advise.

## 2013-02-04 ENCOUNTER — Encounter (HOSPITAL_BASED_OUTPATIENT_CLINIC_OR_DEPARTMENT_OTHER): Payer: Medicare Other | Attending: General Surgery

## 2013-02-04 DIAGNOSIS — L84 Corns and callosities: Secondary | ICD-10-CM | POA: Insufficient documentation

## 2013-02-04 DIAGNOSIS — E1069 Type 1 diabetes mellitus with other specified complication: Secondary | ICD-10-CM | POA: Insufficient documentation

## 2013-02-04 DIAGNOSIS — I10 Essential (primary) hypertension: Secondary | ICD-10-CM | POA: Insufficient documentation

## 2013-02-04 DIAGNOSIS — L97509 Non-pressure chronic ulcer of other part of unspecified foot with unspecified severity: Secondary | ICD-10-CM | POA: Insufficient documentation

## 2013-02-05 NOTE — Progress Notes (Signed)
[facility]  NAME:  NYSEAN, PRATS NO.:  [id number]  MEDICAL RECORD NO.:  [id number]      DATE OF BIRTH:  07/11/1956  PHYSICIAN:  Judene Companion, M.D.           VISIT DATE:                                  OFFICE VISIT   [name] is a 57 year old diabetic man who weighs 425 pounds and he came in here with a 139/79 blood pressure, pulse 56, capillary blood glucose 200.  He is afebrile.  This man has been with [place] before diabetic foot ulcers and today he has got a diabetic Wagner 2 plantar ulcer on his left foot and I debrided it of callus and gave him a prescription for diabetic shoes.  I think we have caught this early and if we offload him, I think he will be okay.  He has had great troubles with this before.  He says that his sugars have been running high.  I tried to talk to him about his eating habits and to lose some weight and to get his blood sugars down.  So I would say his diagnoses are type 1 diabetes, hypertension, morbid obesity, diabetic foot ulcer left foot, which is Wagner 2, and I will see him here next week.     Judene Companion, M.D.     PP/MEDQ  D:  02/04/2013  T:  02/05/2013  Job:  [id number]

## 2013-02-05 NOTE — Telephone Encounter (Signed)
I spoke to the patient.  He was treated at the [facility].  He is seeing Dr. Loanne Drilling May 27.

## 2013-02-12 ENCOUNTER — Ambulatory Visit: Payer: Medicare Other | Admitting: Endocrinology

## 2013-02-12 DIAGNOSIS — Z0289 Encounter for other administrative examinations: Secondary | ICD-10-CM

## 2013-02-27 ENCOUNTER — Other Ambulatory Visit: Payer: Self-pay | Admitting: Internal Medicine

## 2013-03-04 ENCOUNTER — Other Ambulatory Visit: Payer: Self-pay

## 2013-03-04 MED ORDER — HYDROCODONE-ACETAMINOPHEN 10-325 MG PO TABS: 1.0000 | ORAL_TABLET | Freq: Four times a day (QID) | ORAL | Status: DC | PRN

## 2013-03-04 NOTE — Telephone Encounter (Signed)
Hydrocodone called to pharmacy  

## 2013-03-14 ENCOUNTER — Telehealth: Payer: Self-pay | Admitting: Internal Medicine

## 2013-03-14 NOTE — Telephone Encounter (Signed)
Is this correct, patient is needing orthopedic shoes?

## 2013-03-14 NOTE — Telephone Encounter (Signed)
Wants [place] to try and contact [employer] and see if they can fax over order for orthopedic shoes

## 2013-03-15 NOTE — Telephone Encounter (Signed)
Yep. They are the provider of the shoes and he does qualify

## 2013-03-18 NOTE — Telephone Encounter (Signed)
I spoke to someone at [employer] and she will fax over the necessary form.

## 2013-03-19 NOTE — Telephone Encounter (Signed)
Form is being filled out and waiting on MD signature.

## 2013-03-25 NOTE — Telephone Encounter (Signed)
Form has been faxed to [phone]

## 2013-04-10 ENCOUNTER — Telehealth: Payer: Self-pay

## 2013-04-10 NOTE — Telephone Encounter (Signed)
Phone call from patient stating an office note needs to be faxed to [place] [phone]) that shows a need for diabetic shoes. Office note from 01/29/13 has been faxed.

## 2013-04-15 ENCOUNTER — Other Ambulatory Visit: Payer: Self-pay | Admitting: Endocrinology

## 2013-04-15 ENCOUNTER — Other Ambulatory Visit: Payer: Self-pay | Admitting: Cardiology

## 2013-04-15 ENCOUNTER — Other Ambulatory Visit: Payer: Self-pay | Admitting: Internal Medicine

## 2013-04-15 ENCOUNTER — Other Ambulatory Visit: Payer: Self-pay | Admitting: *Deleted

## 2013-04-15 MED ORDER — TERBINAFINE HCL 250 MG PO TABS: 250.0000 mg | ORAL_TABLET | Freq: Every day | ORAL | Status: DC

## 2013-05-07 ENCOUNTER — Other Ambulatory Visit (INDEPENDENT_AMBULATORY_CARE_PROVIDER_SITE_OTHER): Payer: Medicare Other

## 2013-05-07 ENCOUNTER — Encounter: Payer: Self-pay | Admitting: Internal Medicine

## 2013-05-07 ENCOUNTER — Ambulatory Visit (INDEPENDENT_AMBULATORY_CARE_PROVIDER_SITE_OTHER): Payer: Medicare Other | Admitting: Internal Medicine

## 2013-05-07 VITALS — BP 138/70 | HR 62 | Temp 97.9°F | Wt >= 6400 oz

## 2013-05-07 DIAGNOSIS — E1369 Other specified diabetes mellitus with other specified complication: Secondary | ICD-10-CM

## 2013-05-07 DIAGNOSIS — I10 Essential (primary) hypertension: Secondary | ICD-10-CM

## 2013-05-07 DIAGNOSIS — E1129 Type 2 diabetes mellitus with other diabetic kidney complication: Secondary | ICD-10-CM

## 2013-05-07 DIAGNOSIS — E669 Obesity, unspecified: Secondary | ICD-10-CM

## 2013-05-07 DIAGNOSIS — E039 Hypothyroidism, unspecified: Secondary | ICD-10-CM

## 2013-05-07 DIAGNOSIS — E785 Hyperlipidemia, unspecified: Secondary | ICD-10-CM

## 2013-05-07 DIAGNOSIS — N058 Unspecified nephritic syndrome with other morphologic changes: Secondary | ICD-10-CM

## 2013-05-07 DIAGNOSIS — L988 Other specified disorders of the skin and subcutaneous tissue: Secondary | ICD-10-CM

## 2013-05-07 DIAGNOSIS — E109 Type 1 diabetes mellitus without complications: Secondary | ICD-10-CM

## 2013-05-07 DIAGNOSIS — M169 Osteoarthritis of hip, unspecified: Secondary | ICD-10-CM

## 2013-05-07 DIAGNOSIS — E13620 Other specified diabetes mellitus with diabetic dermatitis: Secondary | ICD-10-CM

## 2013-05-07 DIAGNOSIS — E1121 Type 2 diabetes mellitus with diabetic nephropathy: Secondary | ICD-10-CM | POA: Insufficient documentation

## 2013-05-07 LAB — COMPREHENSIVE METABOLIC PANEL
Albumin: 3.6 g/dL (ref 3.5–5.2)
Alkaline Phosphatase: 136 U/L — ABNORMAL HIGH (ref 39–117)
BUN: 31 mg/dL — ABNORMAL HIGH (ref 6–23)
CO2: 24 mEq/L (ref 19–32)
Calcium: 8.9 mg/dL (ref 8.4–10.5)
Chloride: 104 mEq/L (ref 96–112)
GFR: 58.75 mL/min — ABNORMAL LOW (ref >60.00)
Glucose, Bld: 298 mg/dL — ABNORMAL HIGH (ref 70–99)
Potassium: 4.7 mEq/L (ref 3.5–5.1)

## 2013-05-07 LAB — MICROALBUMIN / CREATININE URINE RATIO
Creatinine,U: 88.1 mg/dL
Microalb Creat Ratio: 5.3 mg/g (ref 0.0–30.0)

## 2013-05-07 LAB — LIPID PANEL
Cholesterol: 173 mg/dL (ref 0–200)
Triglycerides: 77 mg/dL (ref 0.0–149.0)

## 2013-05-07 LAB — HEPATIC FUNCTION PANEL
Alkaline Phosphatase: 136 U/L — ABNORMAL HIGH (ref 39–117)
Bilirubin, Direct: 0.1 mg/dL (ref 0.0–0.3)
Total Bilirubin: 0.4 mg/dL (ref 0.3–1.2)
Total Protein: 7.8 g/dL (ref 6.0–8.3)

## 2013-05-07 NOTE — Patient Instructions (Addendum)
1. Diabetes is OUT OF CONTROL!!! You are headed for end-organ damage: kidney, eyes, skin. Plan Return to Dr. Loanne Drilling for mangement assistance.  2. Skin changes Necrobiosis lipoidica diabeticorum - an atophy of the skin in diabetics.  Plan Protect the skin from damage - use support stockings  For any open wound you will need specialized skin creams we can provide  Do not pick at small wounds  3. Right upper chest pain - tendonitis of the short head of the biceps right Plan Lineament of choice  Heat  Range of motion  4. Ortho - progressive degenerative joint disease right hip  5. Hypothyroid disease -  This affects metabolism and makes weight management harder and diabetes control harder Plan Repeat lab today  When results are in will redose thyroid replacement.   6. Abdominal wall changes - X-ray in May was normal. No worrisome findings on exam. Plan  For progressive changes or pain will need a) surgical consult b) CT scan  7. Mobility issues - if you are getting to where you need a power mobility device please contact a provider of equipment and set up a mobility exam.

## 2013-05-07 NOTE — Progress Notes (Signed)
  Subjective:    Patient ID: [name], male    DOB: November 24, 1955, 57 y.o.   MRN: [id number]  HPI [name] presents due to a change in his abdominal wall - complains that it feels hard, but no painful. Also at the umbilicus he has a sensation of a bulge but only minimally tender. He has had low sigmoidectomy for cancer and he has had lap band surgery. Chart reviewed: Abdominal x-ray May 5th was normal.  He is having pain at the right shoulder - sharp. Relieved by extension. No limitation in activity.  He has skin changes at the distal left LE - a change in skin structure and color. NO pain or open lesion. He also has several 1-2 cm round lesions on the distal leg. They will scab over and then leave a scar.   Reviewed labs: last A1C 13.7% in April '14. He has not seen Dr. Loanne Drilling for > 2 months. Lab also revealed a creatinine of 1.9. Also, TSH was elevated at 10.0 - he had been on levothyroxine 50 mcg but ran out.     aReview of Systems System review is negative for any constitutional, cardiac, pulmonary, GI or neuro symptoms or complaints other than as described in the HPI.     Objective:   Physical Exam Filed Vitals:   05/07/13 1040  BP: 138/70  Pulse: 62  Temp: 97.9 F (36.6 C)   Wt Readings from Last 3 Encounters:  05/07/13 445 lb (201.851 kg)  01/29/13 425 lb 14.4 oz (193.187 kg)  01/16/13 420 lb (190.511 kg)   Gen'l - morbidly obvese AA man in no distress HEENT_ normal. C&S clear Cor - RRR Pulm - no increased work of breathing Abd - obese, firm tissue but no focal lesion, fluctuant area, point tenderness. Question of a possible umbilical hernia at superior aspect but hard to tell due to size. Derm -  2 x 5 cm area medial ankle left with indentation of the skin and atrophic appearance.        Assessment & Plan:  Bicipital tendonitis - tender at the insertion of the short head of the biceps right.  Plan Lineament of choice and heat.

## 2013-05-08 DIAGNOSIS — M169 Osteoarthritis of hip, unspecified: Secondary | ICD-10-CM | POA: Insufficient documentation

## 2013-05-08 DIAGNOSIS — E13620 Other specified diabetes mellitus with diabetic dermatitis: Secondary | ICD-10-CM | POA: Insufficient documentation

## 2013-05-08 NOTE — Assessment & Plan Note (Signed)
Patient with h/o elevated TSH and was transiently treated with levothyroxine. Current lab with normal TSH - no treatment needed.

## 2013-05-08 NOTE — Assessment & Plan Note (Signed)
By imaging there is marked deterioration right hip. He does have pain with weight bearing.  Plan Weight loss  PRN use of APAP, avoiding NSAIDs due to CKD

## 2013-05-08 NOTE — Assessment & Plan Note (Signed)
Patient with c/o abdominal wall change - hardness. No defect on exam. Large umbilicus but no obvious hernia or tenderness  PLan For continued or worsening discomfort he will need to see general surgery

## 2013-05-08 NOTE — Assessment & Plan Note (Signed)
Left distal LE - medial with 3 x 5 cm area of skin atrophy  Plan Diabetes control  Protect area from injury and abrasion - support hose.

## 2013-05-08 NOTE — Assessment & Plan Note (Signed)
BMET    Component Value Date/Time   NA 133* 05/07/2013 1149   K 4.7 05/07/2013 1149   CL 104 05/07/2013 1149   CO2 24 05/07/2013 1149   GLUCOSE 298* 05/07/2013 1149   BUN 31* 05/07/2013 1149   CREATININE 1.6* 05/07/2013 1149   CALCIUM 8.9 05/07/2013 1149   GFRNONAA 82.40 05/13/2010 1155        Stable CKD II with slightly improved Creatinine

## 2013-05-08 NOTE — Assessment & Plan Note (Signed)
BP Readings from Last 3 Encounters:  05/07/13 138/70  01/29/13 130/70  01/16/13 118/72   Good control on present medications - no change in regimen

## 2013-05-08 NOTE — Assessment & Plan Note (Signed)
Out of control diabetes. Reviewed with him the long term consequences of the disease and his high risk for complications. Adamantly stressed the need for better control: medication and diet.  Plan Referred back to Dr. Loanne Drilling for management

## 2013-05-09 ENCOUNTER — Other Ambulatory Visit: Payer: Medicare Other

## 2013-05-09 DIAGNOSIS — E1121 Type 2 diabetes mellitus with diabetic nephropathy: Secondary | ICD-10-CM

## 2013-05-10 LAB — CREATININE CLEARANCE, URINE, 24 HOUR
Creatinine Clearance: 152 mL/min — ABNORMAL HIGH (ref 75–125)
Creatinine, 24H Ur: 3308 mg/d — ABNORMAL HIGH (ref 800–2000)
Creatinine, Urine: 94.5 mg/dL
Creatinine: 1.51 mg/dL — ABNORMAL HIGH (ref 0.50–1.35)

## 2013-05-12 ENCOUNTER — Encounter: Payer: Self-pay | Admitting: Internal Medicine

## 2013-05-17 ENCOUNTER — Ambulatory Visit (INDEPENDENT_AMBULATORY_CARE_PROVIDER_SITE_OTHER): Payer: Medicare Other | Admitting: Endocrinology

## 2013-05-17 ENCOUNTER — Encounter: Payer: Self-pay | Admitting: Endocrinology

## 2013-05-17 VITALS — BP 140/78 | HR 78 | Ht 72.0 in | Wt >= 6400 oz

## 2013-05-17 DIAGNOSIS — E109 Type 1 diabetes mellitus without complications: Secondary | ICD-10-CM

## 2013-05-17 MED ORDER — GLUCOSE BLOOD VI STRP: 1.0000 | ORAL_STRIP | Freq: Two times a day (BID) | Status: DC

## 2013-05-17 NOTE — Progress Notes (Signed)
Subjective:    Patient ID: [name], male    DOB: January 02, 1956, 57 y.o.   MRN: [id number]  HPI pt returns for f/u of insulin-requiring DM (dx'ed 2004; he has moderate sensory neuropathy of the lower extremities; he has associated proliferative retinopathy, and renal insufficiency; he had gastric banding surgery in 2008, at [facility].  He lost 200 lbs, but has since regained much of that; pt says he wasn't accepted as a bariatric patient at surgery here in [place], and he does not wish to go back to [place]).  He has a few mos of "bumps" at the insulin injection sites (anterior abdomen), and assoc hyperpigmentation.   He does not check cbg's. He says he does not miss the insulin.   Past Medical History  Diagnosis Date  . Diabetes mellitus     Has Hx of diabetic foot ulcer & peripheral neuropathy  . Hyperlipidemia   . HTN (hypertension)   . Morbid obesity   . History of PFTs 05/2009    Mild Obstructive defect  . CHF (congestive heart failure)     Presumed diastolic. Echo (06/07) w.EF 45%, severe posterior HK, mild LV hypertrophy, No further work-up of abnormal echo was done.  . OSA on CPAP   . Colon cancer 2007    s/p sigmoid colectomy  . Anemia     Past Surgical History  Procedure Laterality Date  . Sigmoid colectomy  10/2005    [facility]  . Corneal transplant  [place]    '80/Right  '84/Left  . Bariatric surgery  12/2009    Lap Band/At [facility]  . Hip surgery      bilateral  . Colonoscopy  multiple  . Tonsillectomy    . Colonoscopy with propofol N/A 11/26/2012    Procedure: COLONOSCOPY WITH PROPOFOL;  Surgeon: Gatha Mayer, MD;  Location: [facility];  Service: Endoscopy;  Laterality: N/A;  may need pre appt. with anesthesia due to morbid obesity    History   Social History  . Marital Status: Legally Separated    Spouse Name: N/A    Number of Children: 0  . Years of Education: N/A   Occupational History  . Food Industry    Social History Main Topics  . Smoking status:  Never Smoker   . Smokeless tobacco: Never Used  . Alcohol Use: No     Comment: Only Occasional.  . Drug Use: No  . Sexual Activity: Not Currently   Other Topics Concern  . 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   In new relationship (04/2009) - girlfriend helps taste for cooking business and helps monitor for ulcers.   Regular Exercise- No          Current Outpatient Prescriptions on File Prior to Visit  Medication Sig Dispense Refill  . aspirin EC 81 MG tablet Take 1 tablet (81 mg total) by mouth daily.      . bisoprolol (ZEBETA) 10 MG tablet TAKE 1 TABLET BY MOUTH DAILY.  90 tablet  3  . cycloSPORINE (RESTASIS) 0.05 % ophthalmic emulsion Place 1 drop into both eyes 2 (two) times daily.  0.4 mL  5  . dicyclomine (BENTYL) 20 MG tablet Take 1 tablet (  20 mg total) by mouth every 6 (six) hours as needed (abdominal pain).  60 tablet  0  . furosemide (LASIX) 40 MG tablet Take 40 mg by mouth 2 (two) times daily as needed (for fluid).      Marland Kitchen HYDROcodone-acetaminophen (NORCO) 10-325 MG per tablet Take 1 tablet by mouth every 6 (six) hours as needed for pain.  90 tablet  1  . insulin detemir (LEVEMIR) 100 UNIT/ML injection Inject 120 Units into the skin every morning.      Marland Kitchen levothyroxine (SYNTHROID, LEVOTHROID) 50 MCG tablet Take 1 tablet (50 mcg total) by mouth daily.  90 tablet  1  . lisinopril (PRINIVIL,ZESTRIL) 40 MG tablet Take 40 mg by mouth every morning.      Marland Kitchen lisinopril (PRINIVIL,ZESTRIL) 40 MG tablet TAKE 1 TABLET (40 MG TOTAL) BY MOUTH DAILY.  60 tablet  0  . Na Sulfate-K Sulfate-Mg Sulf (SUPREP BOWEL PREP) SOLN Use as directed  2 Bottle  0  . polyethylene glycol powder (GLYCOLAX/MIRALAX) powder Take 17 g by mouth daily as needed (for constipation).        . polyethylene glycol powder (GLYCOLAX/MIRALAX) powder MIX 17 GRAMS (1 CAPFUL) WITH 8 OZ OF WATER AND DRINK DAILY  527 g  5  . simvastatin (ZOCOR) 40 MG tablet Take 1 tablet (40 mg total) by mouth at bedtime.  90 tablet  3  . terbinafine (LAMISIL) 250 MG tablet Take 1 tablet (250 mg total) by mouth daily.  90 tablet  0   No current facility-administered medications on file prior to visit.    Allergies  Allergen Reactions  . Shellfish Allergy     Family History  Problem Relation Age of Onset  . Hepatitis Mother     hepatitis C  . Heart attack Brother 21  . Cancer Neg Hx     colon or prostate  . Diabetes Mother   . Diabetes Father    BP 140/78  Pulse 78  Ht 6' (1.829 m)  Wt 443 lb (200.943 kg)  BMI 60.07 kg/m2  SpO2 90%  Review of Systems He has pain at the injection sites.      Objective:   Physical Exam VITAL SIGNS:  See vs page GENERAL: no distress SKIN:  Insulin injection sites at the anterior abdomen have a lumpy texture and hyperpigmentation.   Lab Results  Component Value Date   HGBA1C 12.6* 05/17/2013      Assessment & Plan:  DM: very poor control.  This causes very high risk to his health.  This insulin regimen was chosen from multiple options, for its simplicity.  The benefits of glycemic control must be weighed against the risks of hypoglycemia.   Noncompliance: this complicates the rx of DM. Skin sxs at injection sites, apparently due to insulin injections. Morbid obesity.  This complicates the rx of DM.

## 2013-05-17 NOTE — Patient Instructions (Addendum)
check your blood sugar twice a day.  vary the time of day when you check, between before the 3 meals, and at bedtime.  also check if you have symptoms of your blood sugar being too high or too low.  please keep a record of the readings and bring it to your next appointment here.  please call us sooner if your blood sugar goes below 70, or if you have a lot of readings over 200.     Here is a different meter.  i have sent a prescription to your pharmacy for strips.  Please call your insurance if they do not accept these strips.   Please come back for a follow-up appointment in 1 month.   blood tests are being requested for you today.  We'll contact you with results.   Try injecting the insulin into 4 different spots (30 units each) instead of 2.

## 2013-05-23 ENCOUNTER — Telehealth: Payer: Self-pay | Admitting: *Deleted

## 2013-05-23 NOTE — Telephone Encounter (Signed)
Called [name] and advised him of his A1c, still really high at 12.6. Advised him that Dr Loanne Drilling asked if he wants to increase the Levemir or would he like to try a different insulin? [name] states he would like to stay on Levemir. Please advise to increase of dosage.

## 2013-05-23 NOTE — Telephone Encounter (Signed)
Called [name] and advised him per Dr Loanne Drilling to please increase the levemir to 160 units qam. Please come back for a follow-up appointment in 1 month. [name] understood.

## 2013-05-23 NOTE — Telephone Encounter (Signed)
Please increase the levemir to 160 units qam Please come back for a follow-up appointment in 1 month.

## 2013-05-24 ENCOUNTER — Other Ambulatory Visit: Payer: Self-pay

## 2013-05-24 MED ORDER — HYDROCODONE-ACETAMINOPHEN 10-325 MG PO TABS: 1.0000 | ORAL_TABLET | Freq: Four times a day (QID) | ORAL | Status: DC | PRN

## 2013-05-24 MED ORDER — CYCLOSPORINE 0.05 % OP EMUL: 1.0000 [drp] | Freq: Two times a day (BID) | OPHTHALMIC | Status: DC

## 2013-05-27 NOTE — Telephone Encounter (Signed)
Hydrocodone called to pharmacy  

## 2013-05-29 ENCOUNTER — Ambulatory Visit: Payer: Medicare Other | Admitting: Internal Medicine

## 2013-06-03 ENCOUNTER — Telehealth: Payer: Self-pay | Admitting: Endocrinology

## 2013-06-03 MED ORDER — GLUCOSE BLOOD VI STRP: 1.0000 | ORAL_STRIP | Freq: Two times a day (BID) | Status: DC

## 2013-06-04 ENCOUNTER — Telehealth: Payer: Self-pay | Admitting: Endocrinology

## 2013-06-04 MED ORDER — ONETOUCH ULTRASOFT LANCETS MISC: Status: DC

## 2013-06-04 NOTE — Telephone Encounter (Signed)
Pt advised lancets e-scribed

## 2013-06-05 ENCOUNTER — Telehealth: Payer: Self-pay | Admitting: Endocrinology

## 2013-06-05 ENCOUNTER — Other Ambulatory Visit (INDEPENDENT_AMBULATORY_CARE_PROVIDER_SITE_OTHER): Payer: Medicare Other

## 2013-06-05 ENCOUNTER — Ambulatory Visit (INDEPENDENT_AMBULATORY_CARE_PROVIDER_SITE_OTHER): Payer: Medicare Other | Admitting: Internal Medicine

## 2013-06-05 ENCOUNTER — Encounter: Payer: Self-pay | Admitting: Internal Medicine

## 2013-06-05 VITALS — BP 160/90 | HR 82 | Temp 98.3°F | Wt >= 6400 oz

## 2013-06-05 DIAGNOSIS — I428 Other cardiomyopathies: Secondary | ICD-10-CM

## 2013-06-05 DIAGNOSIS — I5032 Chronic diastolic (congestive) heart failure: Secondary | ICD-10-CM

## 2013-06-05 DIAGNOSIS — I509 Heart failure, unspecified: Secondary | ICD-10-CM

## 2013-06-05 DIAGNOSIS — R69 Illness, unspecified: Secondary | ICD-10-CM

## 2013-06-05 DIAGNOSIS — Z7409 Other reduced mobility: Secondary | ICD-10-CM

## 2013-06-05 LAB — COMPREHENSIVE METABOLIC PANEL
ALT: 18 U/L (ref 0–53)
Albumin: 3.7 g/dL (ref 3.5–5.2)
BUN: 25 mg/dL — ABNORMAL HIGH (ref 6–23)
CO2: 28 mEq/L (ref 19–32)
Calcium: 9.2 mg/dL (ref 8.4–10.5)
Chloride: 103 mEq/L (ref 96–112)
Creatinine, Ser: 1.5 mg/dL (ref 0.4–1.5)
GFR: 64.33 mL/min (ref >60.00)
Potassium: 4.2 mEq/L (ref 3.5–5.1)

## 2013-06-05 NOTE — Patient Instructions (Addendum)
1. Diabetes - last A1C 12.6% - very high!!!!! This is the test for long term control of sugar. Also, progressive nerve damage to the legs and also you may have some muscle damage (diabetic myopathy) both contributing to your legs giving away. You had x-rays of the lumbar spine which showed NO evidence of disk disease or potential nerve compression that could affect you legs.  Plan  you and Dr. Loanne Drilling need to get the Diabetes under control  2. Heart - progressive shortness of breath and a cough with white frothy phlegm suggest possible increased heart failure.  Plan Lab today - to check BNP and chemistry - recommendations on medication changes to follow  2 D echo (ultrasound of the heart) to check on function  Keep appointment with Cardiology  3. Mobility - multi-factorial with hips, weight, neuropathy, heart failure so that you cannot manage your activities of daily living with a power mobility device. You are getting too weak and prone to falls to rely on a rolling walker.   Plan  Will complete this mobility exam in my progress note to be ready to send in.  Will need the forms or prescription instructions from [facility] - they do offer me great assistance with this.  4. Blood pressure is running a little high. Watch the salt.   Continue all your present medications.

## 2013-06-07 ENCOUNTER — Encounter: Payer: Self-pay | Admitting: Internal Medicine

## 2013-06-08 NOTE — Progress Notes (Signed)
Subjective:    Patient ID: [name], male    DOB: 1956-08-30, 57 y.o.   MRN: [id number]  HPI [name] presents for a mobility exam.  [name] is followed for morbid obesity with a BMI > 49, cardiomyopathy with a h/o CHF, Diabetes poorly controlled with a diabetic neuropathy.   [name], due to his multiple medical problems is very limited in mobility. He is loosing strength in his arms, legs and back. He is not able to manage his in-home ADLs well at this point due to very limited mobility and DOE. Currently he struggles with a rollling walker, which is in disrepair due to his weight. He does not have the strength to manage a well chair, in part due to his size and weight. He does need a power wheelchair for in-home mobility, i.e. Getting from bedroom to kitchen or bathroom. He is able to operate a power wheel chair and his home can accomodate the same. Due to his girth and weakness he is unable to use a scooter - he cannot safely mount or balance a scooter. Power mobility assistance will enable him to remain safely in his home.  Past Medical History  Diagnosis Date  . Diabetes mellitus     Has Hx of diabetic foot ulcer & peripheral neuropathy  . Hyperlipidemia   . HTN (hypertension)   . Morbid obesity   . History of PFTs 05/2009    Mild Obstructive defect  . CHF (congestive heart failure)     Presumed diastolic. Echo (06/07) w.EF 45%, severe posterior HK, mild LV hypertrophy, No further work-up of abnormal echo was done.  . OSA on CPAP   . Colon cancer 2007    s/p sigmoid colectomy  . Anemia    Past Surgical History  Procedure Laterality Date  . Sigmoid colectomy  10/2005    [facility]  . Corneal transplant  [place]    '80/Right  '84/Left  . Bariatric surgery  12/2009    Lap Band/At [facility]  . Hip surgery      bilateral  . Colonoscopy  multiple  . Tonsillectomy    . Colonoscopy with propofol N/A 11/26/2012    Procedure: COLONOSCOPY WITH PROPOFOL;  Surgeon: Gatha Mayer, MD;  [facility];  Service: Endoscopy;  Laterality: N/A;  may need pre appt. with anesthesia due to morbid obesity   Family History  Problem Relation Age of Onset  . Hepatitis Mother     hepatitis C  . Heart attack Brother 45  . Cancer Neg Hx     colon or prostate  . Diabetes Mother   . Diabetes Father    History   Social History  . Marital Status: Legally Separated    Spouse Name: N/A    Number of Children: 0  . Years of Education: N/A   Occupational History  . Food Industry    Social History Main Topics  . Smoking status: Never Smoker   . Smokeless tobacco: Never Used  . Alcohol Use: No     Comment: Only Occasional.  . Drug Use: No  . Sexual Activity: Not Currently   Other Topics Concern  . 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   In new relationship (04/2009) - girlfriend helps taste for cooking business and helps monitor for ulcers.   Regular Exercise- No          Current Outpatient Prescriptions on File Prior to Visit  Medication Sig Dispense Refill  . aspirin EC 81 MG tablet Take 1 tablet (81 mg total) by mouth daily.      . bisoprolol (ZEBETA) 10 MG tablet TAKE 1 TABLET BY MOUTH DAILY.  90 tablet  3  . cycloSPORINE (RESTASIS) 0.05 % ophthalmic emulsion Place 1 drop into both eyes 2 (two) times daily.  0.4 mL  5  . dicyclomine (BENTYL) 20 MG tablet Take 1 tablet (20 mg total) by mouth every 6 (six) hours as needed (abdominal pain).  60 tablet  0  . furosemide (LASIX) 40 MG tablet Take 40 mg by mouth 2 (two) times daily as needed (for fluid).      Marland Kitchen glucose blood test strip 1 each by Other route 2 (two) times daily. And lancets 2/day 250.01  100 each  12  . HYDROcodone-acetaminophen (NORCO) 10-325 MG per tablet Take  1 tablet by mouth every 6 (six) hours as needed for pain.  90 tablet  0  . insulin detemir (LEVEMIR) 100 UNIT/ML injection Inject 120 Units into the skin every morning.      . Lancets (ONETOUCH ULTRASOFT) lancets Use as instructed  100 each  12  . levothyroxine (SYNTHROID, LEVOTHROID) 50 MCG tablet Take 1 tablet (50 mcg total) by mouth daily.  90 tablet  1  . lisinopril (PRINIVIL,ZESTRIL) 40 MG tablet Take 40 mg by mouth every morning.      Marland Kitchen lisinopril (PRINIVIL,ZESTRIL) 40 MG tablet TAKE 1 TABLET (40 MG TOTAL) BY MOUTH DAILY.  60 tablet  0  . Na Sulfate-K Sulfate-Mg Sulf (SUPREP BOWEL PREP) SOLN Use as directed  2 Bottle  0  . polyethylene glycol powder (GLYCOLAX/MIRALAX) powder Take 17 g by mouth daily as needed (for constipation).       . polyethylene glycol powder (GLYCOLAX/MIRALAX) powder MIX 17 GRAMS (1 CAPFUL) WITH 8 OZ OF WATER AND DRINK DAILY  527 g  5  . simvastatin (ZOCOR) 40 MG tablet Take 1 tablet (40 mg total) by mouth at bedtime.  90 tablet  3  . terbinafine (LAMISIL) 250 MG tablet Take 1 tablet (250 mg total) by mouth daily.  90 tablet  0   No current facility-administered medications on file prior to visit.      Review of Systems System review is negative for any constitutional, cardiac, pulmonary, GI or neuro symptoms or complaints other than as described in the HPI.     Objective:   Physical Exam Filed Vitals:   06/05/13 1003  BP: 160/90  Pulse: 82  Temp: 98.3 F (36.8 C)   Wt Readings from Last 3 Encounters:  06/05/13 448 lb 12.8 oz (203.574 kg)  05/17/13 443 lb (200.943 kg)  05/07/13 445 lb (201.851 kg)   Gen'l- morbidly obese AA man in no acute distress HEENT- C&S clear Cor- difficult to palpate radial pulse. Heart rate is regular Pulm - no increased WOB at rest. No wheezing, no rales Abd - massively obese. Bariatric gastric band port is palpable and non-tender Neuro - A&O x 3, CN II-XII grossly normal. MS - 4/5 grip strength, can rise from sitting with  use of hands and pushing against the counter. He is able to support his weight. Cerebellar - no tremor, no cog-wheeling. Fair  balance. Normal manual dexterity - rapid finger movement, finger - nose maneuver.        Assessment & Plan:  Mobility assessment - with his current medical problems, strength issues and weight he needs a power mobility device to remain independent in his home and able to manage his ADLs. He is mentally and physically able to manage a power wheelchair and his home can accommodate this equipment.

## 2013-06-10 ENCOUNTER — Other Ambulatory Visit: Payer: Self-pay | Admitting: *Deleted

## 2013-06-10 ENCOUNTER — Other Ambulatory Visit: Payer: Self-pay | Admitting: Endocrinology

## 2013-06-10 ENCOUNTER — Telehealth: Payer: Self-pay | Admitting: Endocrinology

## 2013-06-10 MED ORDER — ONETOUCH DELICA LANCING DEV MISC: 1.0000 | Freq: Two times a day (BID) | Status: DC

## 2013-06-10 MED ORDER — LISINOPRIL 40 MG PO TABS: ORAL_TABLET | ORAL | Status: DC

## 2013-06-10 NOTE — Telephone Encounter (Signed)
Spoke with pt and rx sent for one touch delica lancets to [facility]

## 2013-06-17 ENCOUNTER — Ambulatory Visit: Payer: Medicare Other | Admitting: Endocrinology

## 2013-06-17 DIAGNOSIS — Z0289 Encounter for other administrative examinations: Secondary | ICD-10-CM

## 2013-06-18 ENCOUNTER — Other Ambulatory Visit: Payer: Self-pay | Admitting: Cardiology

## 2013-06-18 ENCOUNTER — Other Ambulatory Visit: Payer: Self-pay | Admitting: Internal Medicine

## 2013-06-18 ENCOUNTER — Ambulatory Visit (HOSPITAL_COMMUNITY): Payer: Medicare Other | Attending: Cardiology | Admitting: Radiology

## 2013-06-18 DIAGNOSIS — I079 Rheumatic tricuspid valve disease, unspecified: Secondary | ICD-10-CM | POA: Insufficient documentation

## 2013-06-18 DIAGNOSIS — I5032 Chronic diastolic (congestive) heart failure: Secondary | ICD-10-CM

## 2013-06-18 DIAGNOSIS — I509 Heart failure, unspecified: Secondary | ICD-10-CM | POA: Insufficient documentation

## 2013-06-18 DIAGNOSIS — I428 Other cardiomyopathies: Secondary | ICD-10-CM | POA: Insufficient documentation

## 2013-06-18 DIAGNOSIS — R0602 Shortness of breath: Secondary | ICD-10-CM | POA: Insufficient documentation

## 2013-06-18 MED ORDER — PERFLUTREN PROTEIN A MICROSPH IV SUSP
3.0000 mL | Freq: Once | INTRAVENOUS | Status: AC
  Administered 2013-06-18: 3 mL via INTRAVENOUS

## 2013-06-18 NOTE — Progress Notes (Signed)
Echocardiogram performed with optison.

## 2013-06-20 ENCOUNTER — Encounter (HOSPITAL_COMMUNITY): Payer: Self-pay | Admitting: *Deleted

## 2013-06-20 ENCOUNTER — Other Ambulatory Visit: Payer: Self-pay

## 2013-06-20 ENCOUNTER — Emergency Department (HOSPITAL_COMMUNITY)
Admission: EM | Admit: 2013-06-20 | Discharge: 2013-06-20 | Disposition: A | Discharge status: Home / Self Care | Payer: Medicare Other | Attending: Emergency Medicine | Admitting: Emergency Medicine

## 2013-06-20 ENCOUNTER — Telehealth: Payer: Self-pay

## 2013-06-20 DIAGNOSIS — Y939 Activity, unspecified: Secondary | ICD-10-CM | POA: Insufficient documentation

## 2013-06-20 DIAGNOSIS — Z794 Long term (current) use of insulin: Secondary | ICD-10-CM | POA: Insufficient documentation

## 2013-06-20 DIAGNOSIS — E119 Type 2 diabetes mellitus without complications: Secondary | ICD-10-CM | POA: Insufficient documentation

## 2013-06-20 DIAGNOSIS — I10 Essential (primary) hypertension: Secondary | ICD-10-CM | POA: Insufficient documentation

## 2013-06-20 DIAGNOSIS — I509 Heart failure, unspecified: Secondary | ICD-10-CM | POA: Insufficient documentation

## 2013-06-20 DIAGNOSIS — T465X1A Poisoning by other antihypertensive drugs, accidental (unintentional), initial encounter: Secondary | ICD-10-CM | POA: Insufficient documentation

## 2013-06-20 DIAGNOSIS — E785 Hyperlipidemia, unspecified: Secondary | ICD-10-CM | POA: Insufficient documentation

## 2013-06-20 DIAGNOSIS — T46901A Poisoning by unspecified agents primarily affecting the cardiovascular system, accidental (unintentional), initial encounter: Secondary | ICD-10-CM | POA: Insufficient documentation

## 2013-06-20 DIAGNOSIS — Z862 Personal history of diseases of the blood and blood-forming organs and certain disorders involving the immune mechanism: Secondary | ICD-10-CM | POA: Insufficient documentation

## 2013-06-20 DIAGNOSIS — Z7982 Long term (current) use of aspirin: Secondary | ICD-10-CM | POA: Insufficient documentation

## 2013-06-20 DIAGNOSIS — G4733 Obstructive sleep apnea (adult) (pediatric): Secondary | ICD-10-CM | POA: Insufficient documentation

## 2013-06-20 DIAGNOSIS — Y929 Unspecified place or not applicable: Secondary | ICD-10-CM | POA: Insufficient documentation

## 2013-06-20 DIAGNOSIS — Z79899 Other long term (current) drug therapy: Secondary | ICD-10-CM | POA: Insufficient documentation

## 2013-06-20 DIAGNOSIS — Z85038 Personal history of other malignant neoplasm of large intestine: Secondary | ICD-10-CM | POA: Insufficient documentation

## 2013-06-20 DIAGNOSIS — T50901A Poisoning by unspecified drugs, medicaments and biological substances, accidental (unintentional), initial encounter: Secondary | ICD-10-CM

## 2013-06-20 LAB — CBC WITH DIFFERENTIAL/PLATELET
Basophils Relative: 0 % (ref 0–1)
Eosinophils Absolute: 0.2 10*3/uL (ref 0.0–0.7)
Eosinophils Relative: 3 % (ref 0–5)
HCT: 33 % — ABNORMAL LOW (ref 39.0–52.0)
Lymphs Abs: 1.5 10*3/uL (ref 0.7–4.0)
MCH: 26.2 pg (ref 26.0–34.0)
MCHC: 31.5 g/dL (ref 30.0–36.0)
MCV: 83.1 fL (ref 78.0–100.0)
Neutrophils Relative %: 70 % (ref 43–77)
Platelets: 235 10*3/uL (ref 150–400)
RBC: 3.97 MIL/uL — ABNORMAL LOW (ref 4.22–5.81)

## 2013-06-20 LAB — BASIC METABOLIC PANEL
BUN: 22 mg/dL (ref 6–23)
CO2: 25 mEq/L (ref 19–32)
Calcium: 9.1 mg/dL (ref 8.4–10.5)
Chloride: 103 mEq/L (ref 96–112)
Creatinine, Ser: 1.6 mg/dL — ABNORMAL HIGH (ref 0.50–1.35)
GFR calc non Af Amer: 47 mL/min — ABNORMAL LOW (ref >90)
Glucose, Bld: 145 mg/dL — ABNORMAL HIGH (ref 70–99)

## 2013-06-20 NOTE — ED Notes (Signed)
Pt usu keeps his days-worth of meds in a pill bottle.  He accidentally grabbed the pill bottle full of lisinopril and ingested all 30 of the 40 mg tablets (about 12:30 today).  Presently c/o headache.  BP 147/70 presently.

## 2013-06-20 NOTE — ED Provider Notes (Signed)
Medical screening examination/treatment/procedure(s) were conducted as a shared visit with non-physician practitioner(s) and myself.  I personally evaluated the patient during the encounter  74:48 PM 57 year old male with an accidental ingestion of 29 lisinopril tablets (40mg  each).  He adamantly denies intentional overdose. PA Pisciotta has discussed case with [place] poison control, who recommended observation for 6 hours post ingestion. Will also check labs and EKG. Currently asymptomatic.  Clinical Impression: 1. Accidental overdose, initial encounter       Houston Siren, MD 06/21/13 [phone]

## 2013-06-20 NOTE — ED Provider Notes (Signed)
CSN: [id number]     Arrival date & time 06/20/13  1513 History   First MD Initiated Contact with Patient 06/20/13 1532     Chief Complaint  Patient presents with  . accidental pill ingestion (30 lisinopril)    (Consider location/radiation/quality/duration/timing/severity/associated sxs/prior Treatment) HPI  [name] is a 57 y.o. male with PMH of IDDM, HTN, HLD, CHF with EF of 45% and OSO presenting for evaluation after accidental ingestion of 30x 40mg  lisinopril tabs at 12:30 PM today. Pt states that he puts all of his daily pills into old pill bottles and accidentally grabbed the wrong bottle. Pt realized his mistake and called his PCP Dr. Crissie Sickles who advised him to come to come to the ED. Denies SI, HI, AVH, drug or EtOH abuse, priior suicide attempts,  syncope, CP, SOB, light headed sensation, weakness, palpations, h/o renal issues, ABD pain, NV, change in bowel or bladder habits. Pt reports a mild HA this afternoon which is now resolving, rated at 1/10.    Past Medical History  Diagnosis Date  . Diabetes mellitus     Has Hx of diabetic foot ulcer & peripheral neuropathy  . Hyperlipidemia   . HTN (hypertension)   . Morbid obesity   . History of PFTs 05/2009    Mild Obstructive defect  . OSA on CPAP   . Anemia   . Colon cancer 2007    s/p sigmoid colectomy  . CHF (congestive heart failure)     Presumed diastolic. Echo (06/07) w.EF 45%, severe posterior HK, mild LV hypertrophy, No further work-up of abnormal echo was done.   Past Surgical History  Procedure Laterality Date  . Sigmoid colectomy  10/2005    [facility]  . Corneal transplant  [place]    '80/Right  '84/Left  . Bariatric surgery  12/2009    Lap Band/At [facility]  . Hip surgery      bilateral  . Colonoscopy  multiple  . Tonsillectomy    . Colonoscopy with propofol N/A 11/26/2012    Procedure: COLONOSCOPY WITH PROPOFOL;  Surgeon: Gatha Mayer, MD;  [facility];  Service: Endoscopy;  Laterality: N/A;   may need pre appt. with anesthesia due to morbid obesity   Family History  Problem Relation Age of Onset  . Hepatitis Mother     hepatitis C  . Heart attack Brother 20  . Cancer Neg Hx     colon or prostate  . Diabetes Mother   . Diabetes Father    History  Substance Use Topics  . Smoking status: Never Smoker   . Smokeless tobacco: Never Used  . Alcohol Use: No     Comment: Only Occasional.    Review of Systems 10 systems reviewed and found to be negative, except as noted in the HPI   Allergies  Shellfish allergy  Home Medications   Current Outpatient Rx  Name  Route  Sig  Dispense  Refill  . aspirin EC 81 MG tablet   Oral   Take 1 tablet (81 mg total) by mouth daily.         . bisoprolol (ZEBETA) 10 MG tablet      TAKE 1 TABLET BY MOUTH DAILY.   90 tablet   3   . cycloSPORINE (RESTASIS) 0.05 % ophthalmic emulsion   Both Eyes   Place 1 drop into both eyes 2 (two) times daily.   0.4 mL   5   . dicyclomine (BENTYL) 20 MG tablet  Oral   Take 1 tablet (20 mg total) by mouth every 6 (six) hours as needed (abdominal pain).   60 tablet   0   . furosemide (LASIX) 40 MG tablet   Oral   Take 40 mg by mouth 2 (two) times daily as needed (for fluid).         Marland Kitchen glucose blood test strip   Other   1 each by Other route 2 (two) times daily. And lancets 2/day 250.01   100 each   12   . HYDROcodone-acetaminophen (NORCO) 10-325 MG per tablet   Oral   Take 1 tablet by mouth every 6 (six) hours as needed for pain.   90 tablet   0   . insulin detemir (LEVEMIR) 100 UNIT/ML injection   Subcutaneous   Inject 120 Units into the skin every morning.         Elmore Guise Devices (ONE TOUCH DELICA LANCING DEV) MISC   Does not apply   1 Device by Does not apply route 2 (two) times daily.   60 each   11     250.01   . Lancets (ONETOUCH ULTRASOFT) lancets      Use as instructed   100 each   12   . levothyroxine (SYNTHROID, LEVOTHROID) 50 MCG tablet      TAKE  1 TABLET BY MOUTH DAILY.   90 tablet   1   . lisinopril (PRINIVIL,ZESTRIL) 40 MG tablet   Oral   Take 40 mg by mouth every morning.         Marland Kitchen lisinopril (PRINIVIL,ZESTRIL) 40 MG tablet      TAKE 1 TABLET (40 MG TOTAL) BY MOUTH DAILY.   30 tablet   1     PT NEEDS TO KEEP UPCOMING APPOINTMENT FOR FURTHER  ...   . lisinopril (PRINIVIL,ZESTRIL) 40 MG tablet      TAKE 1 TABLET (40 MG TOTAL) BY MOUTH DAILY.   30 tablet   0     PATIENT MUST KEEP UPCOMING APPOINTMENT TO RECEIVE  .Marland Kitchen.   . Na Sulfate-K Sulfate-Mg Sulf (SUPREP BOWEL PREP) SOLN      Use as directed   2 Bottle   0   . polyethylene glycol powder (GLYCOLAX/MIRALAX) powder   Oral   Take 17 g by mouth daily as needed (for constipation).          . polyethylene glycol powder (GLYCOLAX/MIRALAX) powder      MIX 17 GRAMS (1 CAPFUL) WITH 8 OZ OF WATER AND DRINK DAILY   527 g   5   . simvastatin (ZOCOR) 40 MG tablet   Oral   Take 1 tablet (40 mg total) by mouth at bedtime.   90 tablet   3   . terbinafine (LAMISIL) 250 MG tablet   Oral   Take 1 tablet (250 mg total) by mouth daily.   90 tablet   0    BP 147/70  Pulse 72  Temp(Src) 98.2 F (36.8 C) (Oral)  Resp 20  Ht 6' (1.829 m)  Wt 448 lb (203.211 kg)  BMI 60.75 kg/m2  SpO2 100% Physical Exam  Nursing note and vitals reviewed. Constitutional: He is oriented to person, place, and time. He appears well-developed and well-nourished. No distress.  Morbidly obese  HENT:  Head: Normocephalic and atraumatic.  Mouth/Throat: Oropharynx is clear and moist.  Eyes: Conjunctivae and EOM are normal. Pupils are equal, round, and reactive to light.  Neck: Normal range of motion.  Neck supple. No JVD present.  Cardiovascular: Normal rate, regular rhythm and intact distal pulses.   Pulmonary/Chest: Effort normal and breath sounds normal. No stridor. No respiratory distress. He has no wheezes. He has no rales. He exhibits no tenderness.  Abdominal: Soft. Bowel  sounds are normal. He exhibits no distension. There is no rebound and no guarding.  Musculoskeletal: Normal range of motion.  Neurological: He is alert and oriented to person, place, and time.  Psychiatric: He has a normal mood and affect.    Date: 06/20/2013  Rate: 69  Rhythm: normal sinus rhythm  QRS Axis: normal  Intervals: normal  ST/T Wave abnormalities: normal  Conduction Disutrbances:none  Narrative Interpretation:   Old EKG Reviewed: unchanged    ED Course  Procedures (including critical care time) Labs Review Labs Reviewed  BASIC METABOLIC PANEL - Abnormal; Notable for the following:    Glucose, Bld 145 (*)    Creatinine, Ser 1.60 (*)    GFR calc non Af Amer 47 (*)    GFR calc Af Amer 54 (*)    All other components within normal limits  CBC WITH DIFFERENTIAL - Abnormal; Notable for the following:    RBC 3.97 (*)    Hemoglobin 10.4 (*)    HCT 33.0 (*)    All other components within normal limits     MDM   1. Accidental overdose, initial encounter    Filed Vitals:   06/20/13 1521 06/20/13 1615 06/20/13 1730 06/20/13 1830  BP: 147/70 137/77 141/58 143/57  Pulse: 72 64    Temp: 98.2 F (36.8 C)     TempSrc: Oral     Resp: 20  20 17   Height: 6' (1.829 m)     Weight: 448 lb (203.211 kg)     SpO2: 100% 94%       Wolfram Simpkins is a 57 y.o. male with PMH of IDDM, HTN, HLD, CHF with EF of 45% and OSO presenting for evaluation after accidental ingestion of 30x 40mg  lisinopril tabs at 12:30 PM today. [place] posing control consulted. The recommend obs from 4-6 hours after ingestion. Creatinine is 1.6 today, generally at his baseline. Mild anemia of 1030 also at his baseline. EKG shows no significant abnormalities.   This is a shared visit with the attending physician who personally evaluated the patient and agrees with the care plan.   Pt is observed in the ED until 1900 with no complaints and BP steadily in the 123456 systolic   Pt is hemodynamically stable,  appropriate for, and amenable to discharge at this time. Pt verbalized understanding and agrees with care plan. All questions answered. Outpatient follow-up and specific return precautions discussed.    Note: Portions of this report may have been transcribed using voice recognition software. Every effort was made to ensure accuracy; however, inadvertent computerized transcription errors may be present      Monico Blitz, PA-C 06/20/13 1858

## 2013-06-20 NOTE — Telephone Encounter (Signed)
Patient called in and stated he accidentally swallowed 30 tablets of his 40 mg Lisinopril tablets. Per Dr Linda Hedges patient is to go directly to [facility] ER. Dr Linda Hedges called the ER ahead of time and gave his information so they are aware of patient coming. Patient gave verbal understanding to go to the ER.

## 2013-06-21 ENCOUNTER — Encounter: Payer: Self-pay | Admitting: Internal Medicine

## 2013-06-21 NOTE — ED Provider Notes (Signed)
Medical screening examination/treatment/procedure(s) were conducted as a shared visit with non-physician practitioner(s) and myself.  I personally evaluated the patient during the encounter.   Please see my separate note.     Houston Siren, MD 06/21/13 [phone]

## 2013-07-15 ENCOUNTER — Other Ambulatory Visit: Payer: Self-pay | Admitting: Endocrinology

## 2013-07-15 MED ORDER — INSULIN DETEMIR 100 UNIT/ML FLEXPEN: PEN_INJECTOR | SUBCUTANEOUS | Status: DC

## 2013-07-16 ENCOUNTER — Other Ambulatory Visit: Payer: Self-pay

## 2013-07-16 ENCOUNTER — Telehealth: Payer: Self-pay

## 2013-07-16 ENCOUNTER — Ambulatory Visit (INDEPENDENT_AMBULATORY_CARE_PROVIDER_SITE_OTHER): Payer: Medicare Other | Admitting: Cardiology

## 2013-07-16 ENCOUNTER — Encounter: Payer: Self-pay | Admitting: Cardiology

## 2013-07-16 VITALS — BP 110/60 | HR 62 | Ht 73.0 in | Wt >= 6400 oz

## 2013-07-16 DIAGNOSIS — I5032 Chronic diastolic (congestive) heart failure: Secondary | ICD-10-CM

## 2013-07-16 DIAGNOSIS — I10 Essential (primary) hypertension: Secondary | ICD-10-CM

## 2013-07-16 DIAGNOSIS — I509 Heart failure, unspecified: Secondary | ICD-10-CM

## 2013-07-16 MED ORDER — LISINOPRIL 5 MG PO TABS: 5.0000 mg | ORAL_TABLET | Freq: Every day | ORAL | Status: DC

## 2013-07-16 MED ORDER — INSULIN DETEMIR 100 UNIT/ML FLEXPEN: PEN_INJECTOR | SUBCUTANEOUS | Status: DC

## 2013-07-16 NOTE — Progress Notes (Signed)
Patient ID: [name], male   DOB: 11/24/1955, 57 y.o.   MRN: [id number] PCP: Dr. Linda Hedges  57 yo with history of morbid obesity, diabetes, diastolic CHF presents for cardiology followup.  He has not lost weight.  He has limited mobility due to size and uses a rolling walker.  He has stable dyspnea with walking more than 50-75 feet.  His legs are weak.  No falls.  He has "pin-prick" type atypical chest pain lasting for seconds and not related to exertion.  He has not been taking lisinopril for several weeks.    Labs (8/10): creatinine 1.1, LDL 94, HDL 34 Labs (8/11): K 4.8, creatinine 1.2, LDL 93, HDL 33 Labs (10/12): K 5.4, creatinine 1.5, LDL 95, HDL 42 Labs (8/14): LDL 115, HDL 42 Labs (9/14): BNP 68 Labs (10/14): K 4.5, creatinine 1.6  ECG:  NSR, normal  Allergies (verified):  1)  ! * Shellfish 2)  * Shellfish  Past Medical History: 1. Diabetes mellitus.  Has history of diabetic foot ulcer and diabetic peripheral neuropathy. .  2. Hyperlipidemia 3. Hypertension 4. Colon cancer: s/p sigmoid colectomy in 2007 5. Morbid obesity s/p lap band procedure.  6. Anemia 7. Obstructive Sleep Apnea, CPAP 8. CHF: Diastolic.  Echo (5/12): EF 50-55%, mild LV dilation, mild LVH, moderate (grade II) diastolic dysfunction.  Echo (9/14) with EF 50-55%, mild LVH, moderately dilated LV, RV poorly visualized.  9. PFTs (9/10): mild obstructive defect 10.  Dobutamine stress echo (9/10): EF 55%, mild LV hypertrophy, no significant valvular disease, no ischemia or infarction  11. CKD  Family History: father- deceased @68 : Asbestosis mother - deceased @ 44: CHF, CAD, Hepatitis C Neg- colon or prostate cancer. Brother - died of heart failure and had MI at 42  Social History: HSG, 2 years of college Native of [place]. 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 In new  relationship (04/2009)...girlfriend helps taste for cooking business and helps monitor for ulcers.   Never Smoked Alcohol use-occasional.   Drug use-no Regular exercise-no   Current Outpatient Prescriptions  Medication Sig Dispense Refill  . aspirin EC 81 MG tablet Take 1 tablet (81 mg total) by mouth daily.      . bisoprolol (ZEBETA) 10 MG tablet Take 10 mg by mouth daily.      . furosemide (LASIX) 40 MG tablet Take 40 mg by mouth daily.       Marland Kitchen glucose blood test strip 1 each by Other route 2 (two) times daily. And lancets 2/day 250.01  100 each  12  . HYDROcodone-acetaminophen (NORCO) 10-325 MG per tablet Take 1 tablet by mouth every 6 (six) hours as needed for pain.  90 tablet  0  . insulin detemir (LEVEMIR) 100 UNIT/ML injection Inject 60-120 Units into the skin 2 (two) times daily. 120 units in the am and 60 units at night.      Elmore Guise Devices (ONE TOUCH DELICA LANCING DEV) MISC 1 Device by Does not apply route 2 (two) times daily.  60 each  11  . Lancets (ONETOUCH ULTRASOFT) lancets Use as instructed  100 each  12  . levothyroxine (SYNTHROID, LEVOTHROID) 50 MCG tablet Take 50 mcg by mouth daily before breakfast.      . meloxicam (MOBIC) 15 MG tablet Take 15 mg by mouth daily.      . polyethylene glycol powder (GLYCOLAX/MIRALAX)  powder Take 17 g by mouth daily as needed (for constipation).       . simvastatin (ZOCOR) 40 MG tablet Take 1 tablet (40 mg total) by mouth at bedtime.  90 tablet  3  . terbinafine (LAMISIL) 250 MG tablet       . vitamin C (ASCORBIC ACID) 500 MG tablet Take 1,000 mg by mouth daily.      . vitamin E 200 UNIT capsule Take 200 Units by mouth daily.      . cycloSPORINE (RESTASIS) 0.05 % ophthalmic emulsion Place 1 drop into both eyes 2 (two) times daily.  0.4 mL  5  . dicyclomine (BENTYL) 20 MG tablet Take 1 tablet (20 mg total) by mouth every 6 (six) hours as needed (abdominal pain).  60 tablet  0  . Insulin Detemir (LEVEMIR FLEXTOUCH) 100 UNIT/ML SOPN Inject  120 units into the skin every morning.  15 mL  1  . lisinopril (PRINIVIL,ZESTRIL) 5 MG tablet Take 1 tablet (5 mg total) by mouth daily.  90 tablet  3   No current facility-administered medications for this visit.    BP 110/60  Pulse 62  Ht 6\' 1"  (1.854 m)  Wt 200.036 kg (441 lb)  BMI 58.2 kg/m2 General: NAD, morbidly obese.  Neck: Thick, No JVD, no thyromegaly or thyroid nodule.  Lungs: Clear to auscultation bilaterally with normal respiratory effort. CV: Nondisplaced PMI.  Heart irregular S1/S2, no S3/S4, 1/6 early SEM.  1+ ankle edema.  No carotid bruit.  Normal pedal pulses.  Abdomen: Soft, nontender, no hepatosplenomegaly, no distention.  Neurologic: Alert and oriented x 3.  Psych: Normal affect. Extremities: No clubbing or cyanosis.   Assessment/Plan: 1. Chronic diastolic CHF: Exam is difficult for volume, but he does not appear markedly volume overloaded.  Continue Lasix 40 mg daily.  2. Obesity: Weight loss is imperative but will be difficult given his limited mobility.  3. He has been off lisinopril since earlier this month.  BP is not elevated.  Given diabetes with CKD, this should be restarted.  Will use lower dose, lisinopril 5 mg daily.  Check BMET in 2 wks.   [name] 07/16/2013

## 2013-07-16 NOTE — Telephone Encounter (Signed)
Phone call from patient stating he is having a hard time getting his insulin from Dr [facility] office. He states he does not want to go back for a return visit. He does not like that office. Per Dr Linda Hedges he says patient should not be deprived of his insulin. Prescription has been sent to his pharmacy.

## 2013-07-16 NOTE — Patient Instructions (Signed)
Start lisinopril 5mg  daily.   Your physician recommends that you return for lab work in: 2 weeks--BMET.   Your physician wants you to follow-up in: 1 year with Dr Aundra Dubin. (October 2015).  You will receive a reminder letter in the mail two months in advance. If you don't receive a letter, please call our office to schedule the follow-up appointment.

## 2013-07-30 ENCOUNTER — Other Ambulatory Visit: Payer: Medicare Other

## 2013-08-08 ENCOUNTER — Ambulatory Visit: Payer: Medicare Other | Admitting: Internal Medicine

## 2013-08-26 ENCOUNTER — Ambulatory Visit (INDEPENDENT_AMBULATORY_CARE_PROVIDER_SITE_OTHER): Payer: Medicare Other | Admitting: Internal Medicine

## 2013-08-26 ENCOUNTER — Encounter: Payer: Self-pay | Admitting: Internal Medicine

## 2013-08-26 VITALS — BP 126/78 | HR 82 | Temp 98.0°F | Wt >= 6400 oz

## 2013-08-26 DIAGNOSIS — R490 Dysphonia: Secondary | ICD-10-CM

## 2013-08-26 DIAGNOSIS — E785 Hyperlipidemia, unspecified: Secondary | ICD-10-CM

## 2013-08-26 DIAGNOSIS — G4733 Obstructive sleep apnea (adult) (pediatric): Secondary | ICD-10-CM

## 2013-08-26 DIAGNOSIS — I428 Other cardiomyopathies: Secondary | ICD-10-CM

## 2013-08-26 DIAGNOSIS — E109 Type 1 diabetes mellitus without complications: Secondary | ICD-10-CM

## 2013-08-26 MED ORDER — HYDROCODONE-ACETAMINOPHEN 10-325 MG PO TABS: 1.0000 | ORAL_TABLET | Freq: Three times a day (TID) | ORAL | Status: DC

## 2013-08-26 NOTE — Progress Notes (Signed)
Pre visit review using our clinic review tool, if applicable. No additional management support is needed unless otherwise documented below in the visit note. 

## 2013-08-26 NOTE — Progress Notes (Signed)
Subjective:     Patient ID: [name], male   DOB: May 07, 1956, 57 y.o.   MRN: [id number]  HPI [name] is a 57 yo with history of morbid obesity s/p bariatric surgery, diabetes mellitus type I, and diastolic CHF who presents with a 1 month history of hoarseness.  He complains of hoarseness while singing begining 1 month ago. After 3 - 4 minutes of singing, he begins to notice his voice begins to feel hoarse, deep and scratchy. He says his voice gets deeper and he begins to "choke" on mucous that he feels in his throat. Once he finishes singing, his voice requires about 10 minutes of rest to return to normal. He is not practicing/singing any more than normal (3 hours a week as usual). He denies noticing any environmental factors that could be contributing. He has no difficulty talking or swallowing. He hasn't tried anything to improve his symptoms. He denies any dysphagia, odynophagia, throat pain, ear pain or tinnitus. He has had some shortness of breath while singing and decreased ability to walk distances that has been progressing.  He notices similar symptoms after using CPAP to sleep at night. He complains of coughing mucous in the morning after using the CPAP. These symptoms have been present for months though. He regularly uses his CPAP.   He denies any fever, HA, fever, chills or night sweats.   He admits to regularly taking his medications as prescribed.   Past Medical History  Diagnosis Date  . Diabetes mellitus     Has Hx of diabetic foot ulcer & peripheral neuropathy  . Hyperlipidemia   . HTN (hypertension)   . Morbid obesity   . History of PFTs 05/2009    Mild Obstructive defect  . OSA on CPAP   . Anemia   . Colon cancer 2007    s/p sigmoid colectomy  . CHF (congestive heart failure)     Presumed diastolic. Echo (06/07) w.EF 45%, severe posterior HK, mild LV hypertrophy, No further work-up of abnormal echo was done.   Past Surgical History  Procedure Laterality Date   . Sigmoid colectomy  10/2005    [facility]  . Corneal transplant  [place]    '80/Right  '84/Left  . Bariatric surgery  12/2009    Lap Band/At [facility]  . Hip surgery      bilateral  . Colonoscopy  multiple  . Tonsillectomy    . Colonoscopy with propofol N/A 11/26/2012    Procedure: COLONOSCOPY WITH PROPOFOL;  Surgeon: Gatha Mayer, MD;  Location: [facility];  Service: Endoscopy;  Laterality: N/A;  may need pre appt. with anesthesia due to morbid obesity   Family History  Problem Relation Age of Onset  . Hepatitis Mother     hepatitis C  . Heart attack Brother 68  . Cancer Neg Hx     colon or prostate  . Diabetes Mother   . Diabetes Father    History   Social History  . Marital Status: Legally Separated    Spouse Name: N/A    Number of Children: 0  . Years of Education: N/A   Occupational History  . Food Industry    Social History Main Topics  . Smoking status: Never Smoker   . Smokeless tobacco: Never Used  . Alcohol Use: No     Comment: Only Occasional.  . Drug Use: No  . Sexual Activity: Not Currently   Other Topics Concern  . 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   In new relationship (04/2009) - girlfriend helps taste for cooking business and helps monitor for ulcers.   Regular Exercise- No          Current Outpatient Prescriptions on File Prior to Visit  Medication Sig Dispense Refill  . aspirin EC 81 MG tablet Take 1 tablet (81 mg total) by mouth daily.      . bisoprolol (ZEBETA) 10 MG tablet Take 10 mg by mouth daily.      . cycloSPORINE (RESTASIS) 0.05 % ophthalmic emulsion Place 1 drop into both eyes 2 (two) times daily.  0.4 mL  5  . dicyclomine (BENTYL) 20 MG tablet Take 1 tablet (20 mg total) by mouth every 6 (six)  hours as needed (abdominal pain).  60 tablet  0  . furosemide (LASIX) 40 MG tablet Take 40 mg by mouth daily.       Marland Kitchen glucose blood test strip 1 each by Other route 2 (two) times daily. And lancets 2/day 250.01  100 each  12  . Insulin Detemir (LEVEMIR FLEXTOUCH) 100 UNIT/ML SOPN Inject 120 units into the skin every morning.  15 mL  1  . insulin detemir (LEVEMIR) 100 UNIT/ML injection Inject 60-120 Units into the skin 2 (two) times daily. 120 units in the am and 60 units at night.      Elmore Guise Devices (ONE TOUCH DELICA LANCING DEV) MISC 1 Device by Does not apply route 2 (two) times daily.  60 each  11  . Lancets (ONETOUCH ULTRASOFT) lancets Use as instructed  100 each  12  . levothyroxine (SYNTHROID, LEVOTHROID) 50 MCG tablet Take 50 mcg by mouth daily before breakfast.      . lisinopril (PRINIVIL,ZESTRIL) 5 MG tablet Take 1 tablet (5 mg total) by mouth daily.  90 tablet  3  . meloxicam (MOBIC) 15 MG tablet Take 15 mg by mouth daily.      . polyethylene glycol powder (GLYCOLAX/MIRALAX) powder Take 17 g by mouth daily as needed (for constipation).       . simvastatin (ZOCOR) 40 MG tablet Take 1 tablet (40 mg total) by mouth at bedtime.  90 tablet  3  . terbinafine (LAMISIL) 250 MG tablet       . vitamin C (ASCORBIC ACID) 500 MG tablet Take 1,000 mg by mouth daily.      . vitamin E 200 UNIT capsule Take 200 Units by mouth daily.       No current facility-administered medications on file prior to visit.     Review of Systems  HENT: Negative for congestion, ear pain, facial swelling, hearing loss, sinus pressure, sore throat, tinnitus and trouble swallowing.   Respiratory: Positive for shortness of breath.        Objective:   Physical Exam Gen: Obese man in no acute distress HEENT:  Antiicteric sclerae. PEERL. Hearing grossly intact bilaterally. Nares patent. No cervical lymphadenopathy. No erythema of pharynx. No thyromegaly.  CV: Regular rate and rhythm. No S3/S4 present. No murmurs,  rubs or gallops appreciated. 2+ Radial pulses.  Pulm: Lungs clear to auscultation bilaterally. No crackles, wheezes or rhonchi. Normal work of breathing. Abd: Soft, non-tender to palpation.  Neuro: AOx3. EOM intact  without nystagmus. Facial sensation intact bilaterally. Facial smile symmetric. Facial strength intact bilaterally. Normal and symmetric palate rise. Normal mid-line protrusion of tongue. Normal shoulder shrug strength.      Assessment and Plan:    Mr. Stilson is a 57 yo with history of morbid obesity s/p bariatric surgery, diabetes mellitus type I with associated retinopathy and renal insufficiency, and diastolic CHF who presents with a 1 month history of hoarseness.  1) Hoarseness: His symptoms do not appear infectious in cause since he denies any fever, chills or night sweats. Although he does admit to some mucous production. Laryngeal cancer is unlikely given the lack of dysphagia, throat pain or weight loss.  His hoarseness is only associated with singing and resolves shortly after stopping singing. As a result, his symptoms are most likely due to voice strain. We recommend watchful waiting. Recommend him to avoid activities that strain his voice (shouting and whispering). Advised patient to call if if hoarseness becomes worse or if he develops other symptoms (dysphagia, odynophagia, or ear pain).   2) He requested a refill of his hydrocodone. He was given a 3 month supply.

## 2013-08-27 ENCOUNTER — Telehealth: Payer: Self-pay

## 2013-08-27 NOTE — Assessment & Plan Note (Signed)
Supposedly still using CPAP. No follow up with pulmonary for > 2years.

## 2013-08-27 NOTE — Telephone Encounter (Signed)
Patient has been notified to come in for lab work.

## 2013-08-27 NOTE — Telephone Encounter (Signed)
Message copied by Shelly Coss on Tue Aug 27, 2013  8:08 AM ------      Message from: Neena Rhymes      Created: Tue Aug 27, 2013  3:42 AM       1. Lysbeth Penner - please call patient - he needs to come in for lab work      2. Hoyle Sauer - please schedule Mr. Felde to see Dr. Loanne Drilling            Thanks ------

## 2013-08-27 NOTE — Assessment & Plan Note (Signed)
Last A1C in August '14 - out of control. Last visit to Dr. Loanne Drilling was August '14.  Plan He is overdue for A1C - will order  Will need follow up appointment with Dr. Loanne Drilling

## 2013-08-27 NOTE — Telephone Encounter (Signed)
Message copied by Richrd Sox on Tue Aug 27, 2013  3:34 PM ------      Message from: Neena Rhymes      Created: Tue Aug 27, 2013  3:42 AM       1. Lysbeth Penner - please call patient - he needs to come in for lab work      2. Hoyle Sauer - please schedule Mr. Pirolli to see Dr. Loanne Drilling            Thanks ------

## 2013-08-27 NOTE — Telephone Encounter (Signed)
Ok to change from Dr. Loanne Drilling to Dr. Dwyane Dee or Letta Median

## 2013-08-27 NOTE — Assessment & Plan Note (Signed)
Stable w/o signs of decompensation.

## 2013-08-27 NOTE — Assessment & Plan Note (Signed)
Last LDL in August '14 - 115, not quite at goal. LFTs were normal.  PLan Follow up lab with recommendations to follow.

## 2013-08-27 NOTE — Telephone Encounter (Signed)
Called pt, pt wasn't home.  Left msg with his brother to have him call the office asap. Thanks!

## 2013-08-27 NOTE — Telephone Encounter (Signed)
Pt called back.  He will go to the lab this week.  His sugar was 111 today with a low of 84 yesterday.  He does not want to go back to the [facility].  He had problems getting his medicines.  He wants a new referral for endocrinology.

## 2013-08-29 NOTE — Telephone Encounter (Signed)
Pt is aware.  

## 2013-08-30 ENCOUNTER — Telehealth: Payer: Self-pay
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
## 2013-08-30 NOTE — Telephone Encounter (Signed)
Patient has been notified

## 2013-08-30 NOTE — Telephone Encounter (Signed)
Abrasion to toes: twice a day 1. Soak in foot bath with warm water and betadine - just enough to make the water brown but clear 2. Rinse and the wash with soap and water using a washclothe 3. Cover with non-stick dressing or bandaide to keep shoes from rubbing the abrasion  Call for pus, fever, red streaks.

## 2013-09-02 ENCOUNTER — Other Ambulatory Visit (INDEPENDENT_AMBULATORY_CARE_PROVIDER_SITE_OTHER): Payer: Medicare Other

## 2013-09-02 ENCOUNTER — Ambulatory Visit (INDEPENDENT_AMBULATORY_CARE_PROVIDER_SITE_OTHER): Payer: Medicare Other | Admitting: Internal Medicine

## 2013-09-02 ENCOUNTER — Encounter (HOSPITAL_BASED_OUTPATIENT_CLINIC_OR_DEPARTMENT_OTHER): Payer: Medicare Other | Attending: General Surgery

## 2013-09-02 ENCOUNTER — Encounter: Payer: Self-pay | Admitting: Internal Medicine

## 2013-09-02 ENCOUNTER — Telehealth: Payer: Self-pay | Admitting: Internal Medicine

## 2013-09-02 VITALS — BP 130/80 | HR 82 | Temp 98.2°F | Wt >= 6400 oz

## 2013-09-02 DIAGNOSIS — Z794 Long term (current) use of insulin: Secondary | ICD-10-CM | POA: Insufficient documentation

## 2013-09-02 DIAGNOSIS — L089 Local infection of the skin and subcutaneous tissue, unspecified: Secondary | ICD-10-CM

## 2013-09-02 DIAGNOSIS — I10 Essential (primary) hypertension: Secondary | ICD-10-CM

## 2013-09-02 DIAGNOSIS — E1169 Type 2 diabetes mellitus with other specified complication: Secondary | ICD-10-CM | POA: Insufficient documentation

## 2013-09-02 DIAGNOSIS — E039 Hypothyroidism, unspecified: Secondary | ICD-10-CM | POA: Insufficient documentation

## 2013-09-02 DIAGNOSIS — Z79899 Other long term (current) drug therapy: Secondary | ICD-10-CM | POA: Insufficient documentation

## 2013-09-02 DIAGNOSIS — T798XXA Other early complications of trauma, initial encounter: Secondary | ICD-10-CM

## 2013-09-02 DIAGNOSIS — L97509 Non-pressure chronic ulcer of other part of unspecified foot with unspecified severity: Secondary | ICD-10-CM | POA: Insufficient documentation

## 2013-09-02 DIAGNOSIS — E669 Obesity, unspecified: Secondary | ICD-10-CM | POA: Insufficient documentation

## 2013-09-02 DIAGNOSIS — E109 Type 1 diabetes mellitus without complications: Secondary | ICD-10-CM

## 2013-09-02 DIAGNOSIS — E785 Hyperlipidemia, unspecified: Secondary | ICD-10-CM

## 2013-09-02 LAB — BASIC METABOLIC PANEL
BUN: 34 mg/dL — ABNORMAL HIGH (ref 6–23)
Calcium: 8.5 mg/dL (ref 8.4–10.5)
GFR: 52.15 mL/min — ABNORMAL LOW (ref >60.00)
Glucose, Bld: 77 mg/dL (ref 70–99)
Sodium: 139 mEq/L (ref 135–145)

## 2013-09-02 LAB — LIPID PANEL
Cholesterol: 159 mg/dL (ref 0–200)
HDL: 38.4 mg/dL — ABNORMAL LOW (ref >39.00)
Total CHOL/HDL Ratio: 4
Triglycerides: 59 mg/dL (ref 0.0–149.0)

## 2013-09-02 LAB — HEPATIC FUNCTION PANEL
ALT: 22 U/L (ref 0–53)
AST: 22 U/L (ref 0–37)
Alkaline Phosphatase: 110 U/L (ref 39–117)
Total Bilirubin: 0.5 mg/dL (ref 0.3–1.2)
Total Protein: 7.8 g/dL (ref 6.0–8.3)

## 2013-09-02 LAB — GLUCOSE, CAPILLARY
Glucose-Capillary: 43 mg/dL — CL (ref 70–99)
Glucose-Capillary: 52 mg/dL — ABNORMAL LOW (ref 70–99)

## 2013-09-02 LAB — HEMOGLOBIN A1C: Hgb A1c MFr Bld: 8.5 % — ABNORMAL HIGH (ref 4.6–6.5)

## 2013-09-02 NOTE — Telephone Encounter (Signed)
09/02/2013  Pt came into office wanting Dr. Linda Hedges to look at his foot; this is in regards to phone note from 08/30/2013  Pt states that it has pus and wants someone to look at it.  Pt will be downstairs in lab, please contact pt.

## 2013-09-02 NOTE — Telephone Encounter (Signed)
09/02/2013  Pt says he will wait in our waiting room until he hears something since he is already here.  Please advise

## 2013-09-02 NOTE — Patient Instructions (Signed)
To wound center today for care of toe wound.

## 2013-09-02 NOTE — Telephone Encounter (Signed)
Patient to be seen this morning
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
## 2013-09-02 NOTE — Telephone Encounter (Signed)
09/02/2013  Pt states he is making an appt with the [facility] to be seen as well.

## 2013-09-02 NOTE — Progress Notes (Signed)
Pre visit review using our clinic review tool, if applicable. No additional management support is needed unless otherwise documented below in the visit note. 

## 2013-09-03 ENCOUNTER — Telehealth: Payer: Self-pay

## 2013-09-03 ENCOUNTER — Other Ambulatory Visit: Payer: Self-pay | Admitting: Internal Medicine

## 2013-09-03 MED ORDER — LEVOFLOXACIN 750 MG PO TABS: 750.0000 mg | ORAL_TABLET | Freq: Every day | ORAL | Status: DC

## 2013-09-03 NOTE — Telephone Encounter (Signed)
Bad on me!! Sent it in. What did the wound center do for him? I haven't received a note.
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
## 2013-09-03 NOTE — Telephone Encounter (Signed)
Phone call from patient stating he went to the wound center yesterday and bandages were placed on his feet. He states he will need help changing them so he is requesting an order for someone to come to his home to do so. Please advise.

## 2013-09-03 NOTE — Telephone Encounter (Signed)
I called patient and let him know the prescription has been sent in.   He states the wound center cut away all the dead skin then put Silvadene cream in place and then wrapped his foot.

## 2013-09-03 NOTE — Telephone Encounter (Signed)
Patient called back and states he checked with his pharmacy and they do not have the script for penicillin

## 2013-09-05 NOTE — Telephone Encounter (Signed)
Left message on VM referral sent.

## 2013-09-05 NOTE — Telephone Encounter (Signed)
Pt called requesting an order for someone to come out to wrap the wounds on his feet.  Please advise

## 2013-09-06 ENCOUNTER — Encounter: Payer: Self-pay | Admitting: Internal Medicine

## 2013-09-17 ENCOUNTER — Other Ambulatory Visit: Payer: Self-pay | Admitting: Internal Medicine

## 2013-09-23 ENCOUNTER — Encounter (HOSPITAL_BASED_OUTPATIENT_CLINIC_OR_DEPARTMENT_OTHER): Payer: Medicare HMO | Attending: General Surgery

## 2013-09-23 DIAGNOSIS — L97509 Non-pressure chronic ulcer of other part of unspecified foot with unspecified severity: Secondary | ICD-10-CM | POA: Insufficient documentation

## 2013-09-23 DIAGNOSIS — E1169 Type 2 diabetes mellitus with other specified complication: Secondary | ICD-10-CM | POA: Insufficient documentation

## 2013-09-23 DIAGNOSIS — L97909 Non-pressure chronic ulcer of unspecified part of unspecified lower leg with unspecified severity: Secondary | ICD-10-CM

## 2013-09-23 DIAGNOSIS — I87339 Chronic venous hypertension (idiopathic) with ulcer and inflammation of unspecified lower extremity: Secondary | ICD-10-CM | POA: Insufficient documentation

## 2013-10-14 ENCOUNTER — Telehealth: Payer: Self-pay

## 2013-10-14 NOTE — Telephone Encounter (Signed)
If he is due. OK for 3 Rx's

## 2013-10-14 NOTE — Telephone Encounter (Signed)
Okay for Hydrocodone refill?

## 2013-10-14 NOTE — Telephone Encounter (Signed)
The patient called and is hoping to get a refill of his pain medication (hyrdocodone).  He states he no longer has a personal phone, so he will call back to check on the status of this refill sometime tomorrow.   Thanks!

## 2013-10-15 MED ORDER — HYDROCODONE-ACETAMINOPHEN 10-325 MG PO TABS: 1.0000 | ORAL_TABLET | Freq: Three times a day (TID) | ORAL | Status: DC

## 2013-10-15 NOTE — Telephone Encounter (Signed)
Scripts printed, waiting for signature, then will be placed at the front desk.

## 2013-10-17 ENCOUNTER — Other Ambulatory Visit: Payer: Self-pay | Admitting: Endocrinology

## 2013-10-17 NOTE — Telephone Encounter (Signed)
Pt has finished his course of therapy with this med F/u ov is needed.

## 2013-10-21 ENCOUNTER — Encounter (HOSPITAL_BASED_OUTPATIENT_CLINIC_OR_DEPARTMENT_OTHER): Payer: Medicare HMO | Attending: General Surgery

## 2013-10-21 ENCOUNTER — Other Ambulatory Visit: Payer: Self-pay

## 2013-10-21 DIAGNOSIS — E1169 Type 2 diabetes mellitus with other specified complication: Secondary | ICD-10-CM | POA: Insufficient documentation

## 2013-10-21 DIAGNOSIS — I872 Venous insufficiency (chronic) (peripheral): Secondary | ICD-10-CM | POA: Insufficient documentation

## 2013-10-21 DIAGNOSIS — L97509 Non-pressure chronic ulcer of other part of unspecified foot with unspecified severity: Secondary | ICD-10-CM | POA: Insufficient documentation

## 2013-10-21 MED ORDER — TERBINAFINE HCL 250 MG PO TABS: 250.0000 mg | ORAL_TABLET | Freq: Every day | ORAL | Status: DC

## 2013-10-21 NOTE — Telephone Encounter (Signed)
Please advise if ok to refill thanks!

## 2013-10-25 ENCOUNTER — Telehealth: Payer: Self-pay | Admitting: Internal Medicine

## 2013-10-25 DIAGNOSIS — G4733 Obstructive sleep apnea (adult) (pediatric): Secondary | ICD-10-CM

## 2013-10-25 NOTE — Telephone Encounter (Signed)
Pt request written rx for cpap machine to be send into [facility] (phone [phone]). Please advise. Pt stated that he does not have pulmonologist down here and he got it from [place].

## 2013-10-25 NOTE — Telephone Encounter (Signed)
Will have [facility] called to see and service or replace his equipment, [facility] notified/order placed

## 2013-10-28 NOTE — Telephone Encounter (Signed)
Pt is aware.  

## 2013-11-18 ENCOUNTER — Telehealth: Payer: Self-pay

## 2013-11-18 DIAGNOSIS — G4733 Obstructive sleep apnea (adult) (pediatric): Secondary | ICD-10-CM

## 2013-11-18 NOTE — Telephone Encounter (Signed)
[name] will need a consult from sleep medicine - referral to [facility]

## 2013-11-18 NOTE — Telephone Encounter (Signed)
Phone call from [place] with [employer] [phone]) states additional information is needed regarding patient's cpap machine. ( a fax was also sent-it's in your folder) Additional info that is needed: pressure settings, humidifier, base line sleep study, and office note stating he's compliment with the machine

## 2013-11-20 ENCOUNTER — Institutional Professional Consult (permissible substitution): Payer: Medicare HMO | Admitting: Pulmonary Disease

## 2013-11-22 ENCOUNTER — Other Ambulatory Visit: Payer: Self-pay | Admitting: Internal Medicine

## 2013-11-22 DIAGNOSIS — G4733 Obstructive sleep apnea (adult) (pediatric): Secondary | ICD-10-CM

## 2013-12-16 ENCOUNTER — Ambulatory Visit (INDEPENDENT_AMBULATORY_CARE_PROVIDER_SITE_OTHER): Payer: Medicare HMO | Admitting: Pulmonary Disease

## 2013-12-16 ENCOUNTER — Encounter: Payer: Self-pay | Admitting: Pulmonary Disease

## 2013-12-16 VITALS — BP 148/84 | HR 58 | Temp 97.8°F | Ht 72.0 in | Wt >= 6400 oz

## 2013-12-16 DIAGNOSIS — G4733 Obstructive sleep apnea (adult) (pediatric): Secondary | ICD-10-CM

## 2013-12-16 NOTE — Assessment & Plan Note (Signed)
The patient has a history of obstructive sleep apnea dating back 10 years, and he has been using the same CPAP machine since that time. This quit functioning approximately 2 months ago, and now the patient has had a recurrence of his prior symptoms. He is sleeping poorly, has nonrestorative sleep, and significant daytime sleepiness. We will need to get him a new CPAP device, but we'll have to obtain his old study in order to do so. If we're not able to get this, will need to repeat his study here in [place]. I have encouraged the patient to work aggressively on weight loss.

## 2013-12-16 NOTE — Patient Instructions (Signed)
Will try and locate your prior sleep study so that we can order a new cpap machine for you.  If this is not available, will need to repeat your study. Work on [employer] loss Will arrange followup with me once we get your new machine.

## 2013-12-16 NOTE — Progress Notes (Signed)
Subjective:    Patient ID: [name], male    DOB: 18-May-1956, 58 y.o.   MRN: [id number]  HPI The patient is a 58 year old male who I've been asked to see for management of obstructive sleep apnea. He was apparently diagnosed 10 years ago in [place] with severe OSA, and started on CPAP. He is been wearing CPAP compliantly until approximately 2 months ago when his machine quit working. This is apparently the same device from 10 years ago. He felt that he did very well with CPAP prior to this. He currently is having snoring, witnessed apneas, frequent awakenings, and nonrestorative sleep. He notes severe inappropriate daytime sleepiness, and his Epworth score today is 22. The patient states that his weight is up 100 pounds over the last 2 years.   Sleep Questionnaire What time do you typically go to bed?( Between what hours) 9-10 9-10 at 0935 on 12/16/13 by Len Blalock, CMA How long does it take you to fall asleep? 10 minutes 10 minutes at 0935 on 12/16/13 by Len Blalock, CMA How many times during the night do you wake up? 4 4 at 0935 on 12/16/13 by Len Blalock, CMA What time do you get out of bed to start your day? 0600 0600 at 0935 on 12/16/13 by Len Blalock, CMA Do you drive or operate heavy machinery in your occupation? No No at 0935 on 12/16/13 by Len Blalock, CMA How much has your weight changed (up or down) over the past two years? (In pounds) 100 lb (45.36 kg)100 lb (45.36 kg) increase at 0935 on 12/16/13 by Len Blalock, CMA Have you ever had a sleep study before? Yes Yes at 0935 on 12/16/13 by Len Blalock, CMA If yes, location of study? [place], [place], [facility] [place] on 12/16/13 by Len Blalock, CMA If yes, date of study? approx. 10 years ago.  approx. 10 years ago.  at 0935 on 12/16/13 by Len Blalock, CMA Do you currently use CPAP? Yes Yes at 0935 on 12/16/13 by Len Blalock, CMA If so, what pressure? 15 15 at 0935 on 12/16/13 by Len Blalock, CMA Do you wear oxygen at any time? Yes Yes at 0935 on 12/16/13 by Len Blalock, CMA O2 Flow Rate (L/min) No Value Wears 02 sometimes with exertion, does not remember how many liters. at [place] on 12/16/13 by Len Blalock, CMA   Review of Systems  Constitutional: Negative for fever and unexpected weight change.  HENT: Negative for congestion, dental problem, ear pain, nosebleeds, postnasal drip, rhinorrhea, sinus pressure, sneezing, sore throat and trouble swallowing.   Eyes: Negative for redness and itching.  Respiratory: Positive for cough and shortness of breath. Negative for chest tightness and wheezing.   Cardiovascular: Negative for palpitations and leg swelling.  Gastrointestinal: Negative for nausea and vomiting.  Genitourinary: Negative for dysuria.  Musculoskeletal: Positive for joint swelling.  Skin: Negative for rash.  Neurological: Negative for headaches.  Hematological: Does not bruise/bleed easily.  Psychiatric/Behavioral: Negative for dysphoric mood. The patient is not nervous/anxious.        Objective:   Physical Exam Constitutional:  Morbidly obese male, no acute distress  HENT:  Nares patent without discharge  Oropharynx without exudate, palate and uvula are moderately elongated  Eyes:  Perrla, eomi, no scleral icterus  Neck:  No JVD, no TMG  Cardiovascular:  Normal rate, regular rhythm, no rubs or gallops.  2/6  sem        Intact distal pulses but decreased.  Pulmonary :  Normal breath sounds, no stridor or respiratory distress   No rales, rhonchi, or wheezing  Abdominal:  Soft, nondistended, bowel sounds present.  No tenderness noted.   Musculoskeletal:  2+ lower extremity edema noted.  Lymph Nodes:  No cervical lymphadenopathy noted  Skin:  No cyanosis noted  Neurologic:  Alert, appropriate, moves all 4 extremities without obvious  deficit.         Assessment & Plan:

## 2013-12-17 ENCOUNTER — Encounter (HOSPITAL_COMMUNITY): Payer: Self-pay | Admitting: Emergency Medicine

## 2013-12-17 ENCOUNTER — Emergency Department (HOSPITAL_COMMUNITY): Payer: Medicare HMO

## 2013-12-17 ENCOUNTER — Inpatient Hospital Stay (HOSPITAL_COMMUNITY)
Admission: EM | Admit: 2013-12-17 | Discharge: 2013-12-24 | DRG: 336 | Disposition: A | Discharge status: Home / Self Care | Payer: Medicare HMO | Attending: General Surgery | Admitting: General Surgery

## 2013-12-17 ENCOUNTER — Inpatient Hospital Stay (HOSPITAL_COMMUNITY): Payer: Medicare HMO

## 2013-12-17 DIAGNOSIS — Z91013 Allergy to seafood: Secondary | ICD-10-CM

## 2013-12-17 DIAGNOSIS — Z85038 Personal history of other malignant neoplasm of large intestine: Secondary | ICD-10-CM

## 2013-12-17 DIAGNOSIS — Z9221 Personal history of antineoplastic chemotherapy: Secondary | ICD-10-CM

## 2013-12-17 DIAGNOSIS — Z7982 Long term (current) use of aspirin: Secondary | ICD-10-CM

## 2013-12-17 DIAGNOSIS — Z9884 Bariatric surgery status: Secondary | ICD-10-CM

## 2013-12-17 DIAGNOSIS — Z833 Family history of diabetes mellitus: Secondary | ICD-10-CM

## 2013-12-17 DIAGNOSIS — I5030 Unspecified diastolic (congestive) heart failure: Secondary | ICD-10-CM | POA: Diagnosis present

## 2013-12-17 DIAGNOSIS — E1149 Type 2 diabetes mellitus with other diabetic neurological complication: Secondary | ICD-10-CM | POA: Diagnosis present

## 2013-12-17 DIAGNOSIS — E1142 Type 2 diabetes mellitus with diabetic polyneuropathy: Secondary | ICD-10-CM | POA: Diagnosis present

## 2013-12-17 DIAGNOSIS — Z9889 Other specified postprocedural states: Secondary | ICD-10-CM

## 2013-12-17 DIAGNOSIS — K660 Peritoneal adhesions (postprocedural) (postinfection): Secondary | ICD-10-CM

## 2013-12-17 DIAGNOSIS — K56609 Unspecified intestinal obstruction, unspecified as to partial versus complete obstruction: Secondary | ICD-10-CM

## 2013-12-17 DIAGNOSIS — E785 Hyperlipidemia, unspecified: Secondary | ICD-10-CM | POA: Diagnosis present

## 2013-12-17 DIAGNOSIS — Z6841 Body Mass Index (BMI) 40.0 and over, adult: Secondary | ICD-10-CM

## 2013-12-17 DIAGNOSIS — Z8719 Personal history of other diseases of the digestive system: Secondary | ICD-10-CM

## 2013-12-17 DIAGNOSIS — K430 Incisional hernia with obstruction, without gangrene: Principal | ICD-10-CM | POA: Diagnosis present

## 2013-12-17 DIAGNOSIS — Z9049 Acquired absence of other specified parts of digestive tract: Secondary | ICD-10-CM

## 2013-12-17 DIAGNOSIS — Z794 Long term (current) use of insulin: Secondary | ICD-10-CM

## 2013-12-17 DIAGNOSIS — R112 Nausea with vomiting, unspecified: Secondary | ICD-10-CM

## 2013-12-17 DIAGNOSIS — I509 Heart failure, unspecified: Secondary | ICD-10-CM | POA: Diagnosis present

## 2013-12-17 DIAGNOSIS — K560 Paralytic ileus: Secondary | ICD-10-CM | POA: Diagnosis not present

## 2013-12-17 DIAGNOSIS — K460 Unspecified abdominal hernia with obstruction, without gangrene: Secondary | ICD-10-CM | POA: Diagnosis present

## 2013-12-17 DIAGNOSIS — G4733 Obstructive sleep apnea (adult) (pediatric): Secondary | ICD-10-CM | POA: Diagnosis present

## 2013-12-17 DIAGNOSIS — Z8249 Family history of ischemic heart disease and other diseases of the circulatory system: Secondary | ICD-10-CM

## 2013-12-17 DIAGNOSIS — E1169 Type 2 diabetes mellitus with other specified complication: Secondary | ICD-10-CM | POA: Diagnosis present

## 2013-12-17 DIAGNOSIS — I10 Essential (primary) hypertension: Secondary | ICD-10-CM | POA: Diagnosis present

## 2013-12-17 DIAGNOSIS — R109 Unspecified abdominal pain: Secondary | ICD-10-CM

## 2013-12-17 LAB — CBC WITH DIFFERENTIAL/PLATELET
BASOS ABS: 0 10*3/uL (ref 0.0–0.1)
Basophils Relative: 0 % (ref 0–1)
EOS ABS: 0.1 10*3/uL (ref 0.0–0.7)
Eosinophils Relative: 1 % (ref 0–5)
HCT: 34.4 % — ABNORMAL LOW (ref 39.0–52.0)
Hemoglobin: 11 g/dL — ABNORMAL LOW (ref 13.0–17.0)
Lymphocytes Relative: 11 % — ABNORMAL LOW (ref 12–46)
Lymphs Abs: 0.9 10*3/uL (ref 0.7–4.0)
MCH: 26.3 pg (ref 26.0–34.0)
MCHC: 32 g/dL (ref 30.0–36.0)
MCV: 82.3 fL (ref 78.0–100.0)
Monocytes Absolute: 0.5 10*3/uL (ref 0.1–1.0)
Monocytes Relative: 6 % (ref 3–12)
Neutro Abs: 7 10*3/uL (ref 1.7–7.7)
Neutrophils Relative %: 82 % — ABNORMAL HIGH (ref 43–77)
PLATELETS: 249 10*3/uL (ref 150–400)
RBC: 4.18 MIL/uL — ABNORMAL LOW (ref 4.22–5.81)
RDW: 15.4 % (ref 11.5–15.5)
WBC: 8.4 10*3/uL (ref 4.0–10.5)

## 2013-12-17 LAB — COMPREHENSIVE METABOLIC PANEL
ALT: 16 U/L (ref 0–53)
AST: 18 U/L (ref 0–37)
Albumin: 3.6 g/dL (ref 3.5–5.2)
Alkaline Phosphatase: 155 U/L — ABNORMAL HIGH (ref 39–117)
BILIRUBIN TOTAL: 0.3 mg/dL (ref 0.3–1.2)
BUN: 23 mg/dL (ref 6–23)
CALCIUM: 9.8 mg/dL (ref 8.4–10.5)
CO2: 19 meq/L (ref 19–32)
CREATININE: 1.27 mg/dL (ref 0.50–1.35)
Chloride: 99 mEq/L (ref 96–112)
GFR calc Af Amer: 71 mL/min — ABNORMAL LOW (ref >90)
GFR, EST NON AFRICAN AMERICAN: 62 mL/min — AB (ref >90)
Glucose, Bld: 367 mg/dL — ABNORMAL HIGH (ref 70–99)
Potassium: 4.7 mEq/L (ref 3.7–5.3)
Sodium: 137 mEq/L (ref 137–147)
Total Protein: 8.3 g/dL (ref 6.0–8.3)

## 2013-12-17 LAB — CBG MONITORING, ED
GLUCOSE-CAPILLARY: 323 mg/dL — AB (ref 70–99)
Glucose-Capillary: 329 mg/dL — ABNORMAL HIGH (ref 70–99)
Glucose-Capillary: 331 mg/dL — ABNORMAL HIGH (ref 70–99)
Glucose-Capillary: 197 mg/dL — ABNORMAL HIGH (ref 70–99)

## 2013-12-17 LAB — I-STAT TROPONIN, ED: TROPONIN I, POC: 0.03 ng/mL (ref 0.00–0.08)

## 2013-12-17 LAB — LIPASE, BLOOD: Lipase: 24 U/L (ref 11–59)

## 2013-12-17 MED ORDER — IOHEXOL 300 MG/ML  SOLN
25.0000 mL | INTRAMUSCULAR | Status: AC
  Administered 2013-12-17 (×2): 25 mL via ORAL

## 2013-12-17 MED ORDER — INSULIN ASPART 100 UNIT/ML ~~LOC~~ SOLN
0.0000 [IU] | Freq: Three times a day (TID) | SUBCUTANEOUS | Status: DC
  Administered 2013-12-18: 5 [IU] via SUBCUTANEOUS
  Administered 2013-12-18: 4 [IU] via SUBCUTANEOUS
  Filled 2013-12-17: qty 1

## 2013-12-17 MED ORDER — FAMOTIDINE IN NACL 20-0.9 MG/50ML-% IV SOLN
20.0000 mg | INTRAVENOUS | Status: DC
  Administered 2013-12-18: 20 mg via INTRAVENOUS
  Filled 2013-12-17 (×2): qty 50

## 2013-12-17 MED ORDER — METOCLOPRAMIDE HCL 5 MG/ML IJ SOLN
10.0000 mg | Freq: Once | INTRAMUSCULAR | Status: AC
  Administered 2013-12-17: 10 mg via INTRAVENOUS
  Filled 2013-12-17: qty 2

## 2013-12-17 MED ORDER — POTASSIUM CHLORIDE IN NACL 20-0.9 MEQ/L-% IV SOLN
INTRAVENOUS | Status: DC
  Administered 2013-12-17 – 2013-12-18 (×2): via INTRAVENOUS
  Filled 2013-12-17 (×7): qty 1000

## 2013-12-17 MED ORDER — ONDANSETRON HCL 4 MG/2ML IJ SOLN
4.0000 mg | Freq: Four times a day (QID) | INTRAMUSCULAR | Status: DC | PRN
  Administered 2013-12-18: 4 mg via INTRAVENOUS
  Filled 2013-12-17: qty 2

## 2013-12-17 MED ORDER — HYDROMORPHONE HCL PF 1 MG/ML IJ SOLN
1.0000 mg | Freq: Once | INTRAMUSCULAR | Status: AC
  Administered 2013-12-17: 1 mg via INTRAVENOUS
  Filled 2013-12-17: qty 1

## 2013-12-17 MED ORDER — SODIUM CHLORIDE 0.9 % IV BOLUS (SEPSIS)
500.0000 mL | Freq: Once | INTRAVENOUS | Status: AC
  Administered 2013-12-17: 500 mL via INTRAVENOUS

## 2013-12-17 MED ORDER — ONDANSETRON HCL 4 MG/2ML IJ SOLN
4.0000 mg | Freq: Once | INTRAMUSCULAR | Status: AC
  Administered 2013-12-17: 4 mg via INTRAVENOUS
  Filled 2013-12-17: qty 2

## 2013-12-17 MED ORDER — MORPHINE SULFATE 4 MG/ML IJ SOLN: 4.0000 mg | INTRAMUSCULAR | Status: DC | PRN

## 2013-12-17 MED ORDER — INSULIN ASPART 100 UNIT/ML ~~LOC~~ SOLN
6.0000 [IU] | Freq: Once | SUBCUTANEOUS | Status: AC
  Administered 2013-12-17: 6 [IU] via SUBCUTANEOUS
  Filled 2013-12-17: qty 1

## 2013-12-17 MED ORDER — MORPHINE SULFATE 2 MG/ML IJ SOLN
1.0000 mg | INTRAMUSCULAR | Status: DC | PRN
  Administered 2013-12-17 (×2): 4 mg via INTRAVENOUS
  Administered 2013-12-18: 2 mg via INTRAVENOUS
  Filled 2013-12-17: qty 2
  Filled 2013-12-17: qty 1
  Filled 2013-12-17: qty 2
  Filled 2013-12-17: qty 1

## 2013-12-17 MED ORDER — ENOXAPARIN SODIUM 100 MG/ML ~~LOC~~ SOLN
90.0000 mg | Freq: Once | SUBCUTANEOUS | Status: AC
  Administered 2013-12-18: 90 mg via SUBCUTANEOUS
  Filled 2013-12-17: qty 1

## 2013-12-17 MED ORDER — INSULIN ASPART 100 UNIT/ML ~~LOC~~ SOLN
8.0000 [IU] | Freq: Once | SUBCUTANEOUS | Status: AC
  Administered 2013-12-17: 8 [IU] via SUBCUTANEOUS
  Filled 2013-12-17: qty 1

## 2013-12-17 MED ORDER — LEVOTHYROXINE SODIUM 100 MCG IV SOLR
25.0000 ug | Freq: Every day | INTRAVENOUS | Status: DC
  Filled 2013-12-17 (×2): qty 5

## 2013-12-17 MED ORDER — METOPROLOL TARTRATE 1 MG/ML IV SOLN
5.0000 mg | Freq: Four times a day (QID) | INTRAVENOUS | Status: DC
  Administered 2013-12-18 – 2013-12-23 (×20): 5 mg via INTRAVENOUS
  Filled 2013-12-17 (×30): qty 5

## 2013-12-17 NOTE — ED Notes (Signed)
Pt fingers cold, reports he is anemic and the pulse ox always reads low on him.  Took several deep breaths, O2 sat increased.

## 2013-12-17 NOTE — ED Notes (Signed)
Received medication from pharmacy however iv bag cap not present spoke with Pharmacist will send new medication.

## 2013-12-17 NOTE — ED Notes (Signed)
Called radiology patient ready for xray.

## 2013-12-17 NOTE — ED Notes (Signed)
Introduced self to pt.  C/O increased abdominal pain.  NG continues to drain green emesis, approx 550 in canister.  IV patent and infusing via pump.  Pt aware we are waiting for bed assignment on floor.

## 2013-12-17 NOTE — ED Notes (Signed)
Notified RN CBG [id number]

## 2013-12-17 NOTE — ED Notes (Signed)
Patient transported to X-ray 

## 2013-12-17 NOTE — ED Provider Notes (Signed)
CSN: [id number]     Arrival date & time 12/17/13  1006 History   First MD Initiated Contact with Patient 12/17/13 1019     Chief Complaint  Patient presents with  . Emesis  . Abdominal Pain  . Shortness of Breath     HPI  Mr. sweat and presents with less than 24 hours of abdominal pain nausea and vomiting. He states he had rather sudden nausea last night and some midline abdominal pain. States he felt like "things just popped out". Vomiting to the point is unable to keep them as his last night or today. Presents here. States she's never been told in the past that he had a ventral hernia. Has not had protrusion in this area before. He has had a previous hemicolectomy has a midline abdominal incision.  Did not take his insulin because he is unable to keep down fluids and food last night and this morning. PCP was Dr. Linda Hedges, is now Dr. [name].  Past Medical History  Diagnosis Date  . Diabetes mellitus     Has Hx of diabetic foot ulcer & peripheral neuropathy  . Hyperlipidemia   . HTN (hypertension)   . Morbid obesity   . History of PFTs 05/2009    Mild Obstructive defect  . OSA on CPAP   . Anemia   . Colon cancer 2007    s/p sigmoid colectomy  . CHF (congestive heart failure)     Presumed diastolic. Echo (06/07) w.EF 45%, severe posterior HK, mild LV hypertrophy, No further work-up of abnormal echo was done.   Past Surgical History  Procedure Laterality Date  . Sigmoid colectomy  10/2005    [facility]  . Corneal transplant  [place]    '80/Right  '84/Left  . Bariatric surgery  12/2009    Lap Band/At [facility]  . Hip surgery      bilateral  . Colonoscopy  multiple  . Tonsillectomy    . Colonoscopy with propofol N/A 11/26/2012    Procedure: COLONOSCOPY WITH PROPOFOL;  Surgeon: Gatha Mayer, MD;  Location: [facility];  Service: Endoscopy;  Laterality: N/A;  may need pre appt. with anesthesia due to morbid obesity   Family History  Problem Relation Age of Onset  . Hepatitis Mother      hepatitis C  . Heart attack Brother 90  . Cancer Neg Hx     colon or prostate  . Diabetes Mother   . Diabetes Father    History  Substance Use Topics  . Smoking status: Never Smoker   . Smokeless tobacco: Never Used  . Alcohol Use: No     Comment: Only Occasional.    Review of Systems  Constitutional: Negative for fever, chills, diaphoresis, appetite change and fatigue.  HENT: Negative for mouth sores, sore throat and trouble swallowing.   Eyes: Negative for visual disturbance.  Respiratory: Negative for cough, chest tightness, shortness of breath and wheezing.   Cardiovascular: Negative for chest pain.  Gastrointestinal: Positive for nausea, vomiting, abdominal pain and abdominal distention. Negative for diarrhea.  Endocrine: Negative for polydipsia, polyphagia and polyuria.  Genitourinary: Negative for dysuria, frequency and hematuria.  Musculoskeletal: Negative for gait problem.  Skin: Negative for color change, pallor and rash.  Neurological: Negative for dizziness, syncope, light-headedness and headaches.  Hematological: Does not bruise/bleed easily.  Psychiatric/Behavioral: Negative for behavioral problems and confusion.      Allergies  Shellfish allergy  Home Medications   Current Outpatient Rx  Name  Route  Sig  Dispense  Refill  . aspirin EC 325 MG tablet   Oral   Take 325 mg by mouth daily.         . bisoprolol (ZEBETA) 10 MG tablet   Oral   Take 10 mg by mouth daily.         . cycloSPORINE (RESTASIS) 0.05 % ophthalmic emulsion   Both Eyes   Place 1 drop into both eyes 2 (two) times daily.   0.4 mL   5   . furosemide (LASIX) 40 MG tablet   Oral   Take 40 mg by mouth 2 (two) times daily.         Marland Kitchen HYDROcodone-acetaminophen (NORCO) 10-325 MG per tablet   Oral   Take 1 tablet by mouth 3 (three) times daily as needed for moderate pain. MAY FILL ON 12/13/13         . insulin detemir (LEVEMIR) 100 UNIT/ML injection   Subcutaneous    Inject 60-120 Units into the skin 2 (two) times daily. 120 units in the am and 60 units at night.         . levothyroxine (SYNTHROID, LEVOTHROID) 50 MCG tablet   Oral   Take 50 mcg by mouth daily before breakfast.         . lisinopril (PRINIVIL,ZESTRIL) 5 MG tablet   Oral   Take 1 tablet (5 mg total) by mouth daily.   90 tablet   3   . meloxicam (MOBIC) 15 MG tablet   Oral   Take 15 mg by mouth daily as needed for pain.          . polyethylene glycol powder (GLYCOLAX/MIRALAX) powder   Oral   Take 17 g by mouth daily as needed (for constipation).          . simvastatin (ZOCOR) 40 MG tablet   Oral   Take 1 tablet (40 mg total) by mouth at bedtime.   90 tablet   3   . terbinafine (LAMISIL) 250 MG tablet   Oral   Take 1 tablet (250 mg total) by mouth daily.   90 tablet   3   . vitamin C (ASCORBIC ACID) 500 MG tablet   Oral   Take 1,000 mg by mouth daily.         . vitamin E 200 UNIT capsule   Oral   Take 200 Units by mouth daily.          BP 172/97  Pulse 71  Temp(Src) 98.8 F (37.1 C) (Oral)  Resp 22  Ht 6' (1.829 m)  Wt 444 lb 1 oz (201.425 kg)  BMI 60.21 kg/m2  SpO2 96% Physical Exam  Constitutional: He is oriented to person, place, and time. He appears well-developed and well-nourished. No distress.  Obese adult black male. No acute distress.  HENT:  Head: Normocephalic.  Eyes: Conjunctivae are normal. Pupils are equal, round, and reactive to light. No scleral icterus.  Neck: Normal range of motion. Neck supple. No thyromegaly present.  Cardiovascular: Normal rate and regular rhythm.  Exam reveals no gallop and no friction rub.   No murmur heard. Pulmonary/Chest: Effort normal and breath sounds normal. No respiratory distress. He has no wheezes. He has no rales.  Abdominal: Soft. Bowel sounds are normal. He exhibits no distension. There is no tenderness. There is no rebound.  Obese. Midline infraumbilical incision. Tenderness induration and  firm in the supraumbilical midline abdomen. Palpable firm ventral hernia. Irreducible 2 attempts.  Musculoskeletal:  Normal range of motion.  Neurological: He is alert and oriented to person, place, and time.  Skin: Skin is warm and dry. No rash noted.  Psychiatric: He has a normal mood and affect. His behavior is normal.    ED Course  Procedures (including critical care time) Labs Review Labs Reviewed  CBC WITH DIFFERENTIAL - Abnormal; Notable for the following:    RBC 4.18 (*)    Hemoglobin 11.0 (*)    HCT 34.4 (*)    Neutrophils Relative % 82 (*)    Lymphocytes Relative 11 (*)    All other components within normal limits  COMPREHENSIVE METABOLIC PANEL - Abnormal; Notable for the following:    Glucose, Bld 367 (*)    Alkaline Phosphatase 155 (*)    GFR calc non Af Amer 62 (*)    GFR calc Af Amer 71 (*)    All other components within normal limits  CBG MONITORING, ED - Abnormal; Notable for the following:    Glucose-Capillary 329 (*)    All other components within normal limits  CBG MONITORING, ED - Abnormal; Notable for the following:    Glucose-Capillary 323 (*)    All other components within normal limits  CBG MONITORING, ED - Abnormal; Notable for the following:    Glucose-Capillary 331 (*)    All other components within normal limits  LIPASE, BLOOD  I-STAT TROPOININ, ED   Imaging Review Ct Abdomen Pelvis Wo Contrast  12/17/2013   CLINICAL DATA:  Abdominal pain with nausea.  EXAM: CT ABDOMEN AND PELVIS WITHOUT CONTRAST  TECHNIQUE: Multidetector CT imaging of the abdomen and pelvis was performed following the standard protocol without intravenous contrast.  COMPARISON:  Radiographs dated 01/21/2013 and CT scan dated 08/28/2007  FINDINGS: The patient has a partial small bowel obstruction which is due to a periumbilical hernia containing loops of small bowel. The distal ileum is contained within the hernia sac. The ileum is dilated proximal to the hernia and is not dilated  distal to the hernia.  The colon is not distended.  Liver, spleen, pancreas, adrenal glands, and kidneys appear normal. There is a 3 cm stone in the gallbladder. Gallbladder otherwise appears normal. No dilated bile ducts.  The patient has a lap band in place at the gastroesophageal junction.  No acute osseous abnormalities. Deformity and posttraumatic osteoarthritis of the right hip is present. There are pins in both femoral necks.  IMPRESSION: 1. Partial small bowel obstruction due to a periumbilical hernia containing distal ileum. 2. Cholelithiasis.   Electronically Signed   By: Rozetta Nunnery M.D.   On: 12/17/2013 13:59     EKG Interpretation   Date/Time:  Tuesday December 17 2013 10:19:58 EDT Ventricular Rate:  72 PR Interval:  163 QRS Duration: 106 QT Interval:  423 QTC Calculation: 463 R Axis:   54 Text Interpretation:  Sinus rhythm No ST changes. No Ectopy. Confirmed by  Jeneen Rinks  MD, [place] ([id number]) on 12/17/2013 10:24:25 AM      MDM   Final diagnoses:  None   Patient is placed, and kept n.p.o. with the exception of by mouth contrast.  He course. Was given IV pain meds and antiemetics. No additional emesis. CT scan obtained with by mouth contrast only. He does show small bowel instruction and small bowel containing ventral hernia. He was laid supine and Trendelenburg and additional attempts were made. I do not get any movements and definitely no reduction is obtained.  Placed a call to general surgery. Discussed case  with the PA for general surgery. Patient is to be seen in consultation.    Tanna Furry, MD 12/17/13 1435

## 2013-12-17 NOTE — H&P (Signed)
[name] is an 58 y.o. male.   Chief Complaint: abd pain HPI: 65 yom who is morbidly obese and has undergone sigmoid colectomy (for colon cancer also treated with chemotherapy by his report in 2007), lap band placement in 2011 at outside institution who presents with less than 24 hour history of abdominal pain, nausea and vomiting.  He is not passing really any flatus or bm today.  He came in today due to nothing at home helping to be evaluated. His diabetes is usually under control with sugars in 90s-low 100s.  Past Medical History  Diagnosis Date  . Diabetes mellitus     Has Hx of diabetic foot ulcer & peripheral neuropathy  . Hyperlipidemia   . HTN (hypertension)   . Morbid obesity   . History of PFTs 05/2009    Mild Obstructive defect  . OSA on CPAP   . Anemia   . Colon cancer 2007    s/p sigmoid colectomy  . CHF (congestive heart failure)     Presumed diastolic. Echo (06/07) w.EF 45%, severe posterior HK, mild LV hypertrophy, No further work-up of abnormal echo was done.    Past Surgical History  Procedure Laterality Date  . Sigmoid colectomy  10/2005    [facility]  . Corneal transplant  [place]    '80/Right  '84/Left  . Bariatric surgery  12/2009    Lap Band/At [facility]  . Hip surgery      bilateral  . Colonoscopy  multiple  . Tonsillectomy    . Colonoscopy with propofol N/A 11/26/2012    Procedure: COLONOSCOPY WITH PROPOFOL;  Surgeon: Gatha Mayer, MD;  Location: [facility];  Service: Endoscopy;  Laterality: N/A;  may need pre appt. with anesthesia due to morbid obesity    Family History  Problem Relation Age of Onset  . Hepatitis Mother     hepatitis C  . Heart attack Brother 53  . Cancer Neg Hx     colon or prostate  . Diabetes Mother   . Diabetes Father    Social History:  reports that he has never smoked. He has never used smokeless tobacco. He reports that he does not drink alcohol or use illicit drugs.  Allergies:  Allergies  Allergen Reactions  .  Shellfish Allergy Anaphylaxis    meds reviewed  Results for orders placed during the hospital encounter of 12/17/13 (from the past 48 hour(s))  CBC WITH DIFFERENTIAL     Status: Abnormal   Collection Time    12/17/13 10:28 AM      Result Value Ref Range   WBC 8.4  4.0 - 10.5 K/uL   RBC 4.18 (*) 4.22 - 5.81 MIL/uL   Hemoglobin 11.0 (*) 13.0 - 17.0 g/dL   HCT 34.4 (*) 39.0 - 52.0 %   MCV 82.3  78.0 - 100.0 fL   MCH 26.3  26.0 - 34.0 pg   MCHC 32.0  30.0 - 36.0 g/dL   RDW 15.4  11.5 - 15.5 %   Platelets 249  150 - 400 K/uL   Neutrophils Relative % 82 (*) 43 - 77 %   Neutro Abs 7.0  1.7 - 7.7 K/uL   Lymphocytes Relative 11 (*) 12 - 46 %   Lymphs Abs 0.9  0.7 - 4.0 K/uL   Monocytes Relative 6  3 - 12 %   Monocytes Absolute 0.5  0.1 - 1.0 K/uL   Eosinophils Relative 1  0 - 5 %   Eosinophils Absolute  0.1  0.0 - 0.7 K/uL   Basophils Relative 0  0 - 1 %   Basophils Absolute 0.0  0.0 - 0.1 K/uL  COMPREHENSIVE METABOLIC PANEL     Status: Abnormal   Collection Time    12/17/13 10:28 AM      Result Value Ref Range   Sodium 137  137 - 147 mEq/L   Potassium 4.7  3.7 - 5.3 mEq/L   Chloride 99  96 - 112 mEq/L   CO2 19  19 - 32 mEq/L   Glucose, Bld 367 (*) 70 - 99 mg/dL   BUN 23  6 - 23 mg/dL   Creatinine, Ser 1.27  0.50 - 1.35 mg/dL   Calcium 9.8  8.4 - 10.5 mg/dL   Total Protein 8.3  6.0 - 8.3 g/dL   Albumin 3.6  3.5 - 5.2 g/dL   AST 18  0 - 37 U/L   ALT 16  0 - 53 U/L   Alkaline Phosphatase 155 (*) 39 - 117 U/L   Total Bilirubin 0.3  0.3 - 1.2 mg/dL   GFR calc non Af Amer 62 (*) >90 mL/min   GFR calc Af Amer 71 (*) >90 mL/min   Comment: (NOTE)     The eGFR has been calculated using the CKD EPI equation.     This calculation has not been validated in all clinical situations.     eGFR's persistently <90 mL/min signify possible Chronic Kidney     Disease.  LIPASE, BLOOD     Status: None   Collection Time    12/17/13 10:28 AM      Result Value Ref Range   Lipase 24  11 - 59 U/L   CBG MONITORING, ED     Status: Abnormal   Collection Time    12/17/13 10:30 AM      Result Value Ref Range   Glucose-Capillary 329 (*) 70 - 99 mg/dL   Comment 1 Notify RN    I-STAT TROPOININ, ED     Status: None   Collection Time    12/17/13 10:33 AM      Result Value Ref Range   Troponin i, poc 0.03  0.00 - 0.08 ng/mL   Comment 3            Comment: Due to the release kinetics of cTnI,     a negative result within the first hours     of the onset of symptoms does not rule out     myocardial infarction with certainty.     If myocardial infarction is still suspected,     repeat the test at appropriate intervals.  CBG MONITORING, ED     Status: Abnormal   Collection Time    12/17/13 11:48 AM      Result Value Ref Range   Glucose-Capillary 323 (*) 70 - 99 mg/dL  CBG MONITORING, ED     Status: Abnormal   Collection Time    12/17/13 12:55 PM      Result Value Ref Range   Glucose-Capillary 331 (*) 70 - 99 mg/dL   Ct Abdomen Pelvis Wo Contrast  12/17/2013   CLINICAL DATA:  Abdominal pain with nausea.  EXAM: CT ABDOMEN AND PELVIS WITHOUT CONTRAST  TECHNIQUE: Multidetector CT imaging of the abdomen and pelvis was performed following the standard protocol without intravenous contrast.  COMPARISON:  Radiographs dated 01/21/2013 and CT scan dated 08/28/2007  FINDINGS: The patient has a partial small bowel obstruction which is due  to a periumbilical hernia containing loops of small bowel. The distal ileum is contained within the hernia sac. The ileum is dilated proximal to the hernia and is not dilated distal to the hernia.  The colon is not distended.  Liver, spleen, pancreas, adrenal glands, and kidneys appear normal. There is a 3 cm stone in the gallbladder. Gallbladder otherwise appears normal. No dilated bile ducts.  The patient has a lap band in place at the gastroesophageal junction.  No acute osseous abnormalities. Deformity and posttraumatic osteoarthritis of the right hip is present.  There are pins in both femoral necks.  IMPRESSION: 1. Partial small bowel obstruction due to a periumbilical hernia containing distal ileum. 2. Cholelithiasis.   Electronically Signed   By: Rozetta Nunnery M.D.   On: 12/17/2013 13:59    Review of Systems  Constitutional: Negative for fever and chills.  Respiratory: Negative for cough and shortness of breath.   Cardiovascular: Positive for leg swelling. Negative for chest pain and palpitations.  Gastrointestinal: Positive for nausea, vomiting and abdominal pain. Negative for diarrhea.    Blood pressure 173/93, pulse 71, temperature 98.8 F (37.1 C), temperature source Oral, resp. rate 18, height 6' (1.829 m), weight 444 lb 1 oz (201.425 kg), SpO2 92.00%. Physical Exam  Vitals reviewed. Constitutional: He is oriented to person, place, and time. He appears well-developed and well-nourished.  HENT:  Head: Normocephalic and atraumatic.  Eyes: No scleral icterus.  Cardiovascular: Normal rate, regular rhythm and normal heart sounds.   Respiratory: Effort normal and breath sounds normal. He has no wheezes. He has no rales. He exhibits no tenderness.  GI: Soft. He exhibits no distension. Bowel sounds are decreased. There is tenderness in the periumbilical area. A hernia (by report, he is somewhat tender at umbilicus but this is difficult to tell due to morbid obesity) is present.    Neurological: He is alert and oriented to person, place, and time.     Assessment/Plan Incarcerated incisional hernia with psbo  He appears to have a partial sbo via incisional hernia from his prior colectomy.  I discussed plans for surgery.  We will try to decompress for now with ng tube drainage and see if this resolves but I don't think it will.  He does not need to go urgently to the or.  If the same or not resolved in am which is what I told him I thought would happen will need to go to or for hernia repair and relief of obstruction.  I discussed that decompressing  overnight might allow [place] to do this laparoscopically.  We would go to or tomorrow for hernia repair possibly with mesh although this would depend on operative findings as he is high risk for infection especially with his diabetes.  He understands this plan and we will reevaluate in am unless changes overnight.  [name] 12/17/2013, 2:57 PM

## 2013-12-17 NOTE — ED Notes (Signed)
Notified RN of CBG 331

## 2013-12-17 NOTE — ED Notes (Signed)
Onset shortness of breath and general abdominal pain one day ago with nausea and coffee ground emesis.

## 2013-12-17 NOTE — ED Notes (Signed)
Report to Philomena RN on 4N.

## 2013-12-18 ENCOUNTER — Encounter (HOSPITAL_COMMUNITY): Payer: Self-pay | Admitting: Certified Registered Nurse Anesthetist

## 2013-12-18 ENCOUNTER — Encounter (HOSPITAL_COMMUNITY): Admission: EM | Disposition: A | Discharge status: Home / Self Care | Payer: Self-pay | Source: Home / Self Care

## 2013-12-18 ENCOUNTER — Inpatient Hospital Stay (HOSPITAL_COMMUNITY): Payer: Medicare HMO | Admitting: Certified Registered Nurse Anesthetist

## 2013-12-18 ENCOUNTER — Encounter (HOSPITAL_COMMUNITY): Payer: Medicare HMO | Admitting: Certified Registered Nurse Anesthetist

## 2013-12-18 DIAGNOSIS — Z8719 Personal history of other diseases of the digestive system: Secondary | ICD-10-CM

## 2013-12-18 DIAGNOSIS — Z9889 Other specified postprocedural states: Secondary | ICD-10-CM

## 2013-12-18 HISTORY — PX: INSERTION OF MESH: SHX5868

## 2013-12-18 HISTORY — PX: INCISIONAL HERNIA REPAIR: SHX193

## 2013-12-18 LAB — BASIC METABOLIC PANEL
BUN: 20 mg/dL (ref 6–23)
CALCIUM: 8.7 mg/dL (ref 8.4–10.5)
CO2: 23 mEq/L (ref 19–32)
CREATININE: 1.24 mg/dL (ref 0.50–1.35)
Chloride: 104 mEq/L (ref 96–112)
GFR calc Af Amer: 73 mL/min — ABNORMAL LOW (ref >90)
GFR calc non Af Amer: 63 mL/min — ABNORMAL LOW (ref >90)
Glucose, Bld: 255 mg/dL — ABNORMAL HIGH (ref 70–99)
Potassium: 4.7 mEq/L (ref 3.7–5.3)
Sodium: 140 mEq/L (ref 137–147)

## 2013-12-18 LAB — GLUCOSE, CAPILLARY
Glucose-Capillary: 226 mg/dL — ABNORMAL HIGH (ref 70–99)
Glucose-Capillary: 199 mg/dL — ABNORMAL HIGH (ref 70–99)

## 2013-12-18 LAB — CBC
HEMATOCRIT: 29.5 % — AB (ref 39.0–52.0)
HEMATOCRIT: 30.5 % — AB (ref 39.0–52.0)
Hemoglobin: 9.6 g/dL — ABNORMAL LOW (ref 13.0–17.0)
Hemoglobin: 9.3 g/dL — ABNORMAL LOW (ref 13.0–17.0)
MCH: 25.8 pg — ABNORMAL LOW (ref 26.0–34.0)
MCH: 26 pg (ref 26.0–34.0)
MCHC: 31.5 g/dL (ref 30.0–36.0)
MCHC: 31.5 g/dL (ref 30.0–36.0)
MCV: 82 fL (ref 78.0–100.0)
MCV: 82.4 fL (ref 78.0–100.0)
PLATELETS: 214 10*3/uL (ref 150–400)
Platelets: 234 10*3/uL (ref 150–400)
RBC: 3.58 MIL/uL — ABNORMAL LOW (ref 4.22–5.81)
RBC: 3.72 MIL/uL — AB (ref 4.22–5.81)
RDW: 15.5 % (ref 11.5–15.5)
RDW: 15.6 % — AB (ref 11.5–15.5)
WBC: 5.9 10*3/uL (ref 4.0–10.5)
WBC: 6.4 10*3/uL (ref 4.0–10.5)

## 2013-12-18 LAB — CREATININE, SERUM
Creatinine, Ser: 1.27 mg/dL (ref 0.50–1.35)
GFR, EST AFRICAN AMERICAN: 71 mL/min — AB (ref >90)
GFR, EST NON AFRICAN AMERICAN: 62 mL/min — AB (ref >90)

## 2013-12-18 LAB — MRSA PCR SCREENING: MRSA BY PCR: NEGATIVE

## 2013-12-18 SURGERY — REPAIR, HERNIA, INCISIONAL
Anesthesia: General | Site: Abdomen

## 2013-12-18 MED ORDER — MIDAZOLAM HCL 5 MG/5ML IJ SOLN
INTRAMUSCULAR | Status: DC | PRN
  Administered 2013-12-18 (×2): 1 mg via INTRAVENOUS

## 2013-12-18 MED ORDER — BACITRACIN ZINC 500 UNIT/GM EX OINT
TOPICAL_OINTMENT | CUTANEOUS | Status: AC
  Filled 2013-12-18: qty 15

## 2013-12-18 MED ORDER — PROPOFOL 10 MG/ML IV BOLUS
INTRAVENOUS | Status: AC
  Filled 2013-12-18: qty 20

## 2013-12-18 MED ORDER — OXYCODONE HCL 5 MG/5ML PO SOLN: 5.0000 mg | Freq: Once | ORAL | Status: AC | PRN

## 2013-12-18 MED ORDER — LIDOCAINE HCL (CARDIAC) 20 MG/ML IV SOLN
INTRAVENOUS | Status: AC
  Filled 2013-12-18: qty 5

## 2013-12-18 MED ORDER — LACTATED RINGERS IV SOLN
INTRAVENOUS | Status: DC | PRN
  Administered 2013-12-18 (×2): via INTRAVENOUS

## 2013-12-18 MED ORDER — FENTANYL CITRATE 0.05 MG/ML IJ SOLN
INTRAMUSCULAR | Status: DC | PRN
  Administered 2013-12-18 (×2): 100 ug via INTRAVENOUS
  Administered 2013-12-18 (×2): 50 ug via INTRAVENOUS
  Administered 2013-12-18: 100 ug via INTRAVENOUS
  Administered 2013-12-18 (×2): 50 ug via INTRAVENOUS

## 2013-12-18 MED ORDER — BACITRACIN ZINC 500 UNIT/GM EX OINT
TOPICAL_OINTMENT | CUTANEOUS | Status: DC | PRN
  Administered 2013-12-18: 1 via TOPICAL

## 2013-12-18 MED ORDER — MIDAZOLAM HCL 2 MG/2ML IJ SOLN
INTRAMUSCULAR | Status: AC
  Filled 2013-12-18: qty 2

## 2013-12-18 MED ORDER — ONDANSETRON HCL 4 MG/2ML IJ SOLN: 4.0000 mg | Freq: Four times a day (QID) | INTRAMUSCULAR | Status: DC | PRN

## 2013-12-18 MED ORDER — GLYCOPYRROLATE 0.2 MG/ML IJ SOLN
INTRAMUSCULAR | Status: AC
  Filled 2013-12-18: qty 4

## 2013-12-18 MED ORDER — VECURONIUM BROMIDE 10 MG IV SOLR
INTRAVENOUS | Status: AC
  Filled 2013-12-18: qty 10

## 2013-12-18 MED ORDER — ROCURONIUM BROMIDE 50 MG/5ML IV SOLN
INTRAVENOUS | Status: AC
  Filled 2013-12-18: qty 1

## 2013-12-18 MED ORDER — VECURONIUM BROMIDE 10 MG IV SOLR
INTRAVENOUS | Status: DC | PRN
  Administered 2013-12-18: 3 mg via INTRAVENOUS
  Administered 2013-12-18: 2 mg via INTRAVENOUS

## 2013-12-18 MED ORDER — NALOXONE HCL 0.4 MG/ML IJ SOLN: 0.4000 mg | INTRAMUSCULAR | Status: DC | PRN

## 2013-12-18 MED ORDER — SUCCINYLCHOLINE CHLORIDE 20 MG/ML IJ SOLN
INTRAMUSCULAR | Status: AC
  Filled 2013-12-18: qty 1

## 2013-12-18 MED ORDER — DIPHENHYDRAMINE HCL 12.5 MG/5ML PO ELIX: 12.5000 mg | ORAL_SOLUTION | Freq: Four times a day (QID) | ORAL | Status: DC | PRN

## 2013-12-18 MED ORDER — ONDANSETRON HCL 4 MG/2ML IJ SOLN
INTRAMUSCULAR | Status: AC
  Filled 2013-12-18: qty 2

## 2013-12-18 MED ORDER — INSULIN ASPART 100 UNIT/ML ~~LOC~~ SOLN
0.0000 [IU] | Freq: Every day | SUBCUTANEOUS | Status: DC
Start: 2013-12-18 — End: 2013-12-24
  Administered 2013-12-18 – 2013-12-19 (×2): 2 [IU] via SUBCUTANEOUS

## 2013-12-18 MED ORDER — CEFAZOLIN SODIUM-DEXTROSE 2-3 GM-% IV SOLR
2.0000 g | Freq: Three times a day (TID) | INTRAVENOUS | Status: DC
  Administered 2013-12-18 – 2013-12-19 (×3): 2 g via INTRAVENOUS
  Filled 2013-12-18 (×5): qty 50

## 2013-12-18 MED ORDER — SUCCINYLCHOLINE CHLORIDE 20 MG/ML IJ SOLN
INTRAMUSCULAR | Status: DC | PRN
  Administered 2013-12-18: 180 mg via INTRAVENOUS

## 2013-12-18 MED ORDER — MIDAZOLAM HCL 2 MG/2ML IJ SOLN
1.0000 mg | INTRAMUSCULAR | Status: DC | PRN
Start: 2013-12-18 — End: 2013-12-18

## 2013-12-18 MED ORDER — GLYCOPYRROLATE 0.2 MG/ML IJ SOLN
INTRAMUSCULAR | Status: DC | PRN
  Administered 2013-12-18: .8 mg via INTRAVENOUS

## 2013-12-18 MED ORDER — FENTANYL CITRATE 0.05 MG/ML IJ SOLN
INTRAMUSCULAR | Status: AC
  Filled 2013-12-18: qty 5

## 2013-12-18 MED ORDER — CEFAZOLIN SODIUM-DEXTROSE 2-3 GM-% IV SOLR
INTRAVENOUS | Status: AC
  Filled 2013-12-18: qty 100

## 2013-12-18 MED ORDER — OXYCODONE HCL 5 MG PO TABS: 5.0000 mg | ORAL_TABLET | Freq: Once | ORAL | Status: AC | PRN

## 2013-12-18 MED ORDER — DIPHENHYDRAMINE HCL 12.5 MG/5ML PO ELIX
12.5000 mg | ORAL_SOLUTION | Freq: Four times a day (QID) | ORAL | Status: DC | PRN
  Filled 2013-12-18: qty 10

## 2013-12-18 MED ORDER — PROPOFOL 10 MG/ML IV BOLUS
INTRAVENOUS | Status: DC | PRN
  Administered 2013-12-18: 200 mg via INTRAVENOUS
  Administered 2013-12-18: 10 mg via INTRAVENOUS
  Administered 2013-12-18: 20 mg via INTRAVENOUS
  Administered 2013-12-18: 100 mg via INTRAVENOUS

## 2013-12-18 MED ORDER — HYDROMORPHONE HCL PF 1 MG/ML IJ SOLN
INTRAMUSCULAR | Status: AC
  Filled 2013-12-18: qty 1

## 2013-12-18 MED ORDER — 0.9 % SODIUM CHLORIDE (POUR BTL) OPTIME
TOPICAL | Status: DC | PRN
  Administered 2013-12-18 (×2): 1000 mL

## 2013-12-18 MED ORDER — SODIUM CHLORIDE 0.9 % IJ SOLN: 9.0000 mL | INTRAMUSCULAR | Status: DC | PRN

## 2013-12-18 MED ORDER — DIPHENHYDRAMINE HCL 50 MG/ML IJ SOLN: 12.5000 mg | Freq: Four times a day (QID) | INTRAMUSCULAR | Status: DC | PRN

## 2013-12-18 MED ORDER — HYDROMORPHONE HCL PF 1 MG/ML IJ SOLN
INTRAMUSCULAR | Status: AC
  Filled 2013-12-18: qty 2

## 2013-12-18 MED ORDER — DEXTROSE 5 % IV SOLN
3.0000 g | INTRAVENOUS | Status: DC | PRN
  Administered 2013-12-18: 3 g via INTRAVENOUS

## 2013-12-18 MED ORDER — PROMETHAZINE HCL 25 MG/ML IJ SOLN: 6.2500 mg | INTRAMUSCULAR | Status: DC | PRN

## 2013-12-18 MED ORDER — FENTANYL CITRATE 0.05 MG/ML IJ SOLN: 50.0000 ug | Freq: Once | INTRAMUSCULAR | Status: DC

## 2013-12-18 MED ORDER — INSULIN ASPART 100 UNIT/ML ~~LOC~~ SOLN
0.0000 [IU] | Freq: Three times a day (TID) | SUBCUTANEOUS | Status: DC
Start: 2013-12-19 — End: 2013-12-19

## 2013-12-18 MED ORDER — HYDROMORPHONE 0.3 MG/ML IV SOLN
INTRAVENOUS | Status: DC
  Administered 2013-12-18: 1.59 mg via INTRAVENOUS
  Administered 2013-12-19: 0.3 mg via INTRAVENOUS
  Administered 2013-12-19: 1.39 mg via INTRAVENOUS
  Administered 2013-12-19: 0.599 mg via INTRAVENOUS
  Administered 2013-12-19: 2.39 mg via INTRAVENOUS
  Administered 2013-12-19: 1.59 mg via INTRAVENOUS
  Administered 2013-12-19: 3.36 mg via INTRAVENOUS
  Administered 2013-12-20: 0.969 mg via INTRAVENOUS
  Administered 2013-12-20: 2.59 mg via INTRAVENOUS
  Administered 2013-12-20: 1.39 mg via INTRAVENOUS
  Administered 2013-12-20: 10:00:00 via INTRAVENOUS
  Administered 2013-12-20: 1.99 mg via INTRAVENOUS
  Administered 2013-12-20: 1.34 mg via INTRAVENOUS
  Administered 2013-12-20: 4.19 mg via INTRAVENOUS
  Administered 2013-12-21: 0.554 mg via INTRAVENOUS
  Administered 2013-12-21: 1.99 mg via INTRAVENOUS
  Administered 2013-12-21: 1.53 mg via INTRAVENOUS
  Administered 2013-12-21: 0.999 mg via INTRAVENOUS
  Administered 2013-12-21: 1.94 mg via INTRAVENOUS
  Administered 2013-12-21: 1.99 mg via INTRAVENOUS
  Administered 2013-12-21: 1.79 mg via INTRAVENOUS
  Administered 2013-12-22: 0.6 mg via INTRAVENOUS
  Administered 2013-12-22: 1.9 mg via INTRAVENOUS
  Administered 2013-12-22: 1.99 mg via INTRAVENOUS
  Administered 2013-12-22: 0.7999 mg via INTRAVENOUS
  Administered 2013-12-22: 10:00:00 via INTRAVENOUS
  Administered 2013-12-22: 0.9 mg via INTRAVENOUS
  Administered 2013-12-23: 1.39 mg via INTRAVENOUS
  Administered 2013-12-23: 0.4 mg via INTRAVENOUS
  Administered 2013-12-23: 0.6 mg via INTRAVENOUS
  Administered 2013-12-23: 0.7999 mg via INTRAVENOUS
  Filled 2013-12-18 (×6): qty 25

## 2013-12-18 MED ORDER — SODIUM CHLORIDE 0.9 % IV SOLN
INTRAVENOUS | Status: DC
  Administered 2013-12-18: 22:00:00 via INTRAVENOUS

## 2013-12-18 MED ORDER — HYDROMORPHONE HCL PF 1 MG/ML IJ SOLN
0.2500 mg | INTRAMUSCULAR | Status: DC | PRN
  Administered 2013-12-18 (×3): 0.5 mg via INTRAVENOUS

## 2013-12-18 MED ORDER — NEOSTIGMINE METHYLSULFATE 1 MG/ML IJ SOLN
INTRAMUSCULAR | Status: AC
  Filled 2013-12-18: qty 10

## 2013-12-18 MED ORDER — ENOXAPARIN SODIUM 100 MG/ML ~~LOC~~ SOLN
90.0000 mg | SUBCUTANEOUS | Status: DC
  Administered 2013-12-19 – 2013-12-24 (×6): 90 mg via SUBCUTANEOUS
  Filled 2013-12-18 (×7): qty 1

## 2013-12-18 MED ORDER — LIDOCAINE HCL (CARDIAC) 20 MG/ML IV SOLN
INTRAVENOUS | Status: DC | PRN
  Administered 2013-12-18: 100 mg via INTRAVENOUS

## 2013-12-18 MED ORDER — LACTATED RINGERS IV SOLN
INTRAVENOUS | Status: DC
  Administered 2013-12-18: 10:00:00 via INTRAVENOUS

## 2013-12-18 MED ORDER — NEOSTIGMINE METHYLSULFATE 1 MG/ML IJ SOLN
INTRAMUSCULAR | Status: DC | PRN
  Administered 2013-12-18: 5 mg via INTRAVENOUS

## 2013-12-18 MED ORDER — HYDROMORPHONE 0.3 MG/ML IV SOLN
INTRAVENOUS | Status: AC
  Administered 2013-12-18: 0.3 mg
  Filled 2013-12-18: qty 25

## 2013-12-18 MED ORDER — ROCURONIUM BROMIDE 100 MG/10ML IV SOLN
INTRAVENOUS | Status: DC | PRN
  Administered 2013-12-18: 50 mg via INTRAVENOUS

## 2013-12-18 SURGICAL SUPPLY — 53 items
ADH SKN CLS APL DERMABOND .7 (GAUZE/BANDAGES/DRESSINGS) ×1
BINDER ABD UNIV 10 28-50 (GAUZE/BANDAGES/DRESSINGS) IMPLANT
BINDER ABDOM UNIV 10 (GAUZE/BANDAGES/DRESSINGS) ×6
BLADE SURG 11 STRL SS (BLADE) ×2 IMPLANT
BLADE SURG ROTATE 9660 (MISCELLANEOUS) IMPLANT
CANISTER SUCTION 2500CC (MISCELLANEOUS) ×3 IMPLANT
CHLORAPREP W/TINT 26ML (MISCELLANEOUS) ×3 IMPLANT
COVER SURGICAL LIGHT HANDLE (MISCELLANEOUS) ×3 IMPLANT
DERMABOND ADVANCED (GAUZE/BANDAGES/DRESSINGS) ×2
DERMABOND ADVANCED .7 DNX12 (GAUZE/BANDAGES/DRESSINGS) IMPLANT
DEVICE TROCAR PUNCTURE CLOSURE (ENDOMECHANICALS) ×2 IMPLANT
DRAIN CHANNEL 19F RND (DRAIN) ×4 IMPLANT
DRAPE LAPAROSCOPIC ABDOMINAL (DRAPES) ×3 IMPLANT
ELECT CAUTERY BLADE 6.4 (BLADE) ×3 IMPLANT
ELECT REM PT RETURN 9FT ADLT (ELECTROSURGICAL) ×3
ELECTRODE REM PT RTRN 9FT ADLT (ELECTROSURGICAL) ×1 IMPLANT
EVACUATOR SILICONE 100CC (DRAIN) ×4 IMPLANT
GLOVE BIO SURGEON STRL SZ 6.5 (GLOVE) ×2 IMPLANT
GLOVE BIO SURGEON STRL SZ7 (GLOVE) ×5 IMPLANT
GLOVE BIO SURGEON STRL SZ7.5 (GLOVE) ×4 IMPLANT
GLOVE BIO SURGEONS STRL SZ 6.5 (GLOVE) ×2
GLOVE BIOGEL PI IND STRL 6.5 (GLOVE) IMPLANT
GLOVE BIOGEL PI IND STRL 7.5 (GLOVE) ×1 IMPLANT
GLOVE BIOGEL PI INDICATOR 6.5 (GLOVE) ×2
GLOVE BIOGEL PI INDICATOR 7.5 (GLOVE) ×6
GLOVE SURG SS PI 6.0 STRL IVOR (GLOVE) ×2 IMPLANT
GOWN STRL REUS W/ TWL LRG LVL3 (GOWN DISPOSABLE) ×3 IMPLANT
GOWN STRL REUS W/TWL LRG LVL3 (GOWN DISPOSABLE) ×6
KIT BASIN OR (CUSTOM PROCEDURE TRAY) ×3 IMPLANT
KIT ROOM TURNOVER OR (KITS) ×3 IMPLANT
MESH VENTRALIGHT ST 8X10 (Mesh General) ×2 IMPLANT
NS IRRIG 1000ML POUR BTL (IV SOLUTION) ×5 IMPLANT
PACK GENERAL/GYN (CUSTOM PROCEDURE TRAY) ×3 IMPLANT
PAD ABD 8X10 STRL (GAUZE/BANDAGES/DRESSINGS) ×2 IMPLANT
PAD ARMBOARD 7.5X6 YLW CONV (MISCELLANEOUS) ×3 IMPLANT
PEN SKIN MARKING BROAD (MISCELLANEOUS) ×2 IMPLANT
PIN SAFETY STERILE (MISCELLANEOUS) ×2 IMPLANT
RETAINER VISCERA MED (MISCELLANEOUS) ×2 IMPLANT
SPONGE GAUZE 4X4 12PLY (GAUZE/BANDAGES/DRESSINGS) IMPLANT
SPONGE GAUZE 4X4 12PLY STER LF (GAUZE/BANDAGES/DRESSINGS) ×2 IMPLANT
SPONGE LAP 18X18 X RAY DECT (DISPOSABLE) ×4 IMPLANT
STAPLER VISISTAT 35W (STAPLE) IMPLANT
SUT ETHILON 2 0 FS 18 (SUTURE) ×4 IMPLANT
SUT NOV 1 T60/GS (SUTURE) IMPLANT
SUT NOVA 1 T20/GS 25DT (SUTURE) ×10 IMPLANT
SUT NOVA NAB GS-21 0 18 T12 DT (SUTURE) IMPLANT
SUT PROLENE 0 CT 1 CR/8 (SUTURE) ×2 IMPLANT
SUT VIC AB 2-0 SH 18 (SUTURE) ×2 IMPLANT
TAPE CLOTH SURG 6X10 WHT LF (GAUZE/BANDAGES/DRESSINGS) ×2 IMPLANT
TOWEL OR 17X24 6PK STRL BLUE (TOWEL DISPOSABLE) ×3 IMPLANT
TOWEL OR 17X26 10 PK STRL BLUE (TOWEL DISPOSABLE) ×3 IMPLANT
TOWEL OR NON WOVEN STRL DISP B (DISPOSABLE) ×4 IMPLANT
TRAY FOLEY CATH 14FRSI W/METER (CATHETERS) ×2 IMPLANT

## 2013-12-18 NOTE — Progress Notes (Signed)
Patient arrived in PACU at 1415 combative, paranoid, fighting staff while trying to connect to monitoring devices. Unable to maintain oxygen in place. Provided repeated reorientation and emotional support to patient. Patient's delirium began to resolve within approximately 10-15 minutes of PACU admission. CRNA present until patient able to cooperate.

## 2013-12-18 NOTE — Progress Notes (Signed)
Subjective: Still with abdominal pain  Objective: Vital signs in last 24 hours: Temp:  [97.6 F (36.4 C)-98.8 F (37.1 C)] 97.8 F (36.6 C) (04/01 0538) Pulse Rate:  [66-86] 74 (04/01 0538) Resp:  [18-24] 19 (04/01 0538) BP: (123-173)/(54-97) 161/64 mmHg (04/01 0538) SpO2:  [89 %-97 %] 96 % (04/01 0538) Weight:  [438 lb 4.4 oz (198.8 kg)-444 lb 1 oz (201.425 kg)] 438 lb 4.4 oz (198.8 kg) (03/31 2242)    Intake/Output from previous day: 03/31 0701 - 04/01 0700 In: -  Out: 1020 [Urine:600; Emesis/NG output:200] Intake/Output this shift:    General appearance: no distress GI: obese, tender supraumbilical increased  Lab Results:   Recent Labs  12/18/13 0035 12/18/13 0600  WBC 6.4 5.9  HGB 9.6* 9.3*  HCT 30.5* 29.5*  PLT 234 214   BMET  Recent Labs  12/17/13 1028 12/18/13 0035 12/18/13 0600  NA 137  --  140  K 4.7  --  4.7  CL 99  --  104  CO2 19  --  23  GLUCOSE 367*  --  255*  BUN 23  --  20  CREATININE 1.27 1.27 1.24  CALCIUM 9.8  --  8.7    Studies/Results: Ct Abdomen Pelvis Wo Contrast  12/17/2013   CLINICAL DATA:  Abdominal pain with nausea.  EXAM: CT ABDOMEN AND PELVIS WITHOUT CONTRAST  TECHNIQUE: Multidetector CT imaging of the abdomen and pelvis was performed following the standard protocol without intravenous contrast.  COMPARISON:  Radiographs dated 01/21/2013 and CT scan dated 08/28/2007  FINDINGS: The patient has a partial small bowel obstruction which is due to a periumbilical hernia containing loops of small bowel. The distal ileum is contained within the hernia sac. The ileum is dilated proximal to the hernia and is not dilated distal to the hernia.  The colon is not distended.  Liver, spleen, pancreas, adrenal glands, and kidneys appear normal. There is a 3 cm stone in the gallbladder. Gallbladder otherwise appears normal. No dilated bile ducts.  The patient has a lap band in place at the gastroesophageal junction.  No acute osseous  abnormalities. Deformity and posttraumatic osteoarthritis of the right hip is present. There are pins in both femoral necks.  IMPRESSION: 1. Partial small bowel obstruction due to a periumbilical hernia containing distal ileum. 2. Cholelithiasis.   Electronically Signed   By: Rozetta Nunnery M.D.   On: 12/17/2013 13:59   Dg Abd 2 Views  12/17/2013   CLINICAL DATA:  Partial small bowel obstruction.  EXAM: ABDOMEN - 2 VIEW  COMPARISON:  CT scan dated 12/17/2013  FINDINGS: NG tube has been inserted and the tip is in the fundus of the stomach. There are multiple dilated loops of small bowel consistent with small bowel obstruction. The colon is not distended. No free air. Lap band is noted.  IMPRESSION: Persistent small bowel obstruction pattern.   Electronically Signed   By: Rozetta Nunnery M.D.   On: 12/17/2013 16:34   Assessment/Plan: Incarcerated incisional hernia  I think he has persistent sbo and this has not reduced.  I think unfortunately he will need to go to or this am due to exam.  I don't think laparoscopy is good idea due to bowel distention.  I will plan on open repair of this hernia possibly with mesh. I discussed usage of mesh may not be prudent depending on findings at operation as well as other risk factors for infection like obesity and diabetes.  Risks include but not limited  to bleeding, mesh infection, infection, reoperation, mi, cardiac complications, dvt/pe, long term hospitalization etc.  We will proceed later this am.  [facility] 12/18/2013

## 2013-12-18 NOTE — Anesthesia Preprocedure Evaluation (Signed)
Anesthesia Evaluation  Patient identified by MRN, date of birth, ID band Patient awake    Reviewed: Allergy & Precautions, H&P , NPO status , Patient's Chart, lab work & pertinent test results, reviewed documented beta blocker date and time   History of Anesthesia Complications (+) AWARENESS UNDER ANESTHESIA  Airway Mallampati: II TM Distance: >3 FB Neck ROM: full    Dental  (+) Edentulous Upper, Edentulous Lower   Pulmonary shortness of breath and with exertion, sleep apnea and Continuous Positive Airway Pressure Ventilation , COPD Mild obstructive defect on PFT breath sounds clear to auscultation  Pulmonary exam normal       Cardiovascular hypertension, Pt. on home beta blockers +CHF Rhythm:regular Rate:Normal  Diastolic heart failure. EF 45% 6/07 echo.   Neuro/Psych Peripheral neuropathy negative neurological ROS  negative psych ROS   GI/Hepatic negative GI ROS, Neg liver ROS, Colon ca   Endo/Other  diabetes, Well Controlled, Type 2, Oral Hypoglycemic Agents, Insulin DependentHypothyroidism Morbid obesity  Renal/GU negative Renal ROS  negative genitourinary   Musculoskeletal   Abdominal (+) + obese,   Peds  Hematology negative hematology ROS (+) anemia ,   Anesthesia Other Findings   Reproductive/Obstetrics negative OB ROS                           Anesthesia Physical Anesthesia Plan  ASA: III  Anesthesia Plan: General   Post-op Pain Management:    Induction: Intravenous  Airway Management Planned: Oral ETT and Video Laryngoscope Planned  Additional Equipment:   Intra-op Plan:   Post-operative Plan: Extubation in OR  Informed Consent: I have reviewed the patients History and Physical, chart, labs and discussed the procedure including the risks, benefits and alternatives for the proposed anesthesia with the patient or authorized representative who has indicated his/her  understanding and acceptance.     Plan Discussed with: CRNA and Surgeon  Anesthesia Plan Comments:         Anesthesia Quick Evaluation

## 2013-12-18 NOTE — Transfer of Care (Signed)
Immediate Anesthesia Transfer of Care Note  Patient: [name]  Procedure(s) Performed: Procedure(s):  REPAIR OF INCARCERATED INCISIONAL HERNIA (N/A) INSERTION OF MESH (N/A)  Patient Location: PACU  Anesthesia Type:General  Level of Consciousness: awake and pateint uncooperative; emergence delirium  Airway & Oxygen Therapy: Patient Spontanous Breathing and Patient connected to face mask oxygen, blow-by 10L  Post-op Assessment: Report given to PACU RN, Post -op Vital signs reviewed and stable and Patient moving all extremities X 4  Post vital signs: Reviewed and stable  Complications: No apparent anesthesia complications

## 2013-12-18 NOTE — Op Note (Signed)
Preoperative diagnosis: Incarcerated incisional hernia Postoperative diagnosis: Same as above Procedure: #1 lysis of adhesions x1 hour #2 open incisional hernia repair, preperitoneal with 20 x 25 Ventralight mesh Surgeon: Dr. Serita Grammes Anesthesia: Gen. Complications: None Drains: #119 [place] Blake drain in the retrorectus position #219 [place] Blake drain in the subcutaneous position Estimated blood loss: 50 cc Sponge and count was correct at completion Disposition to recovery in stable condition  Indications: This is a 58 year old male who is morbidly obese who is at a prior low midline with a sigmoid colectomy as well as a laparoscopic adjustable gastric band. He presented with an incarcerated incisional hernia as well as a bowel obstruction. I discussed going to the operating room. I elected to do this open as I thought that he had too much bowel dilatation to do this with the laparoscope. We discussed the risks and benefits which are significant prior to beginning.  Procedure: After informed consent was obtained the patient was taken to the operating room. He was given cefazolin. Sequential compression devices were her legs. He was already on subcutaneous Lovenox on the floor. He had a Foley catheter. He had a nasogastric tube in place. He was placed under general endotracheal anesthesia without complication. His abdomen was prepped and draped in the standard sterile surgical fashion. Surgical timeout was then performed.  I made a midline incision that was above his prior low midline and carried below his umbilicus. I very carefully was able to identify his hernia sac and entered this. He had a longer segment of incarcerated small bowel that I reduced. This was all viable. I then spent about an hour lysing adhesions to the hernia sac as well as the underside of the abdominal wall. There was a small 1 cm hernia over what was about a 4 cm defect. I ran the bowel several times and I noted no  evidence of any injury. I did this all sharply without any cautery. I elected that I would be able to place mesh. I then inserted into the preperitoneal space behind the rectus muscle. I developed this laterally inferiorly and superiorly. Once I was able to develop the space in its entirety I then closed the peritoneum with #1 Novafil suture. This was done really without any tension. I then placed a piece of mesh and put it into position and used the Endo Close device to secure it into place. The mesh was in good position. I then freed up the fascia. I closed this with #1 interrupted Novafil sutures after placing a drain in the retrorectus position. I also placed a drain in the subcutaneous position. I then closed the subcutaneous tissue with 2-0 Vicryl sutures. Staples were placed. The drains were secured with 2-0 nylon. Dressings were placed. Abdominal binder was placed. He was extubated and transferred to recovery stable.

## 2013-12-18 NOTE — Anesthesia Postprocedure Evaluation (Signed)
  Anesthesia Post-op Note  Patient: [name]  Procedure(s) Performed: Procedure(s):  REPAIR OF INCARCERATED INCISIONAL HERNIA (N/A) INSERTION OF MESH (N/A)  Patient Location: PACU  Anesthesia Type:General  Level of Consciousness: awake, alert , oriented and patient cooperative  Airway and Oxygen Therapy: Patient Spontanous Breathing and Patient connected to nasal cannula oxygen  Post-op Pain: mild  Post-op Assessment: Post-op Vital signs reviewed, Patient's Cardiovascular Status Stable, Respiratory Function Stable, [name] Airway, No signs of Nausea or vomiting and Pain level controlled  Post-op Vital Signs: Reviewed and stable  Complications: No apparent anesthesia complications, for Step down bed

## 2013-12-18 NOTE — Progress Notes (Addendum)
Inpatient Diabetes Program Recommendations  AACE/ADA: New Consensus Statement on Inpatient Glycemic Control (2013)  Target Ranges:  Prepandial:   less than 140 mg/dL      Peak postprandial:   less than 180 mg/dL (1-2 hours)      Critically ill patients:  140 - 180 mg/dL   Reason for Assessment:  Elevated CBG's.  Note patient had surgery today for incarcerated hernia.  Diabetes history: Diabetes (Type 1 listed on problem list) Outpatient Diabetes medications: Levemir 120 units in the AM and 60 units in the PM                                            Current orders for Inpatient glycemic control: Novolog moderate tid with meals  Consider restarting a portion of patients home dose of Levemir.  Consider Levemir 30 units in the morning and 30 units in the PM.    Thanks, Adah Perl, RN, BC-ADM Inpatient Diabetes Coordinator Pager [phone]

## 2013-12-19 ENCOUNTER — Encounter (HOSPITAL_COMMUNITY): Payer: Self-pay | Admitting: General Surgery

## 2013-12-19 DIAGNOSIS — E119 Type 2 diabetes mellitus without complications: Secondary | ICD-10-CM

## 2013-12-19 LAB — GLUCOSE, CAPILLARY
GLUCOSE-CAPILLARY: 207 mg/dL — AB (ref 70–99)
GLUCOSE-CAPILLARY: 227 mg/dL — AB (ref 70–99)
Glucose-Capillary: 218 mg/dL — ABNORMAL HIGH (ref 70–99)
Glucose-Capillary: 258 mg/dL — ABNORMAL HIGH (ref 70–99)
Glucose-Capillary: 234 mg/dL — ABNORMAL HIGH (ref 70–99)

## 2013-12-19 LAB — CBC
HEMATOCRIT: 32.5 % — AB (ref 39.0–52.0)
HEMOGLOBIN: 10 g/dL — AB (ref 13.0–17.0)
MCH: 25.8 pg — AB (ref 26.0–34.0)
MCHC: 30.8 g/dL (ref 30.0–36.0)
MCV: 84 fL (ref 78.0–100.0)
Platelets: 238 10*3/uL (ref 150–400)
RBC: 3.87 MIL/uL — ABNORMAL LOW (ref 4.22–5.81)
RDW: 15.7 % — ABNORMAL HIGH (ref 11.5–15.5)
WBC: 7.2 10*3/uL (ref 4.0–10.5)

## 2013-12-19 LAB — BASIC METABOLIC PANEL
BUN: 18 mg/dL (ref 6–23)
CALCIUM: 8.4 mg/dL (ref 8.4–10.5)
CHLORIDE: 106 meq/L (ref 96–112)
CO2: 24 meq/L (ref 19–32)
Creatinine, Ser: 1.48 mg/dL — ABNORMAL HIGH (ref 0.50–1.35)
GFR calc Af Amer: 59 mL/min — ABNORMAL LOW (ref >90)
GFR calc non Af Amer: 51 mL/min — ABNORMAL LOW (ref >90)
GLUCOSE: 244 mg/dL — AB (ref 70–99)
POTASSIUM: 5.2 meq/L (ref 3.7–5.3)
SODIUM: 143 meq/L (ref 137–147)

## 2013-12-19 MED ORDER — WHITE PETROLATUM GEL
Status: AC
  Administered 2013-12-19: 0.2
  Filled 2013-12-19: qty 5

## 2013-12-19 MED ORDER — INSULIN DETEMIR 100 UNIT/ML ~~LOC~~ SOLN
30.0000 [IU] | Freq: Every day | SUBCUTANEOUS | Status: DC
  Administered 2013-12-19 – 2013-12-24 (×6): 30 [IU] via SUBCUTANEOUS
  Filled 2013-12-19 (×7): qty 0.3

## 2013-12-19 MED ORDER — INSULIN ASPART 100 UNIT/ML ~~LOC~~ SOLN
0.0000 [IU] | Freq: Three times a day (TID) | SUBCUTANEOUS | Status: DC
  Administered 2013-12-19: 7 [IU] via SUBCUTANEOUS
  Administered 2013-12-19: 11 [IU] via SUBCUTANEOUS
  Administered 2013-12-19 – 2013-12-20 (×2): 7 [IU] via SUBCUTANEOUS
  Administered 2013-12-20: 4 [IU] via SUBCUTANEOUS
  Administered 2013-12-20 – 2013-12-21 (×2): 7 [IU] via SUBCUTANEOUS
  Administered 2013-12-21: 4 [IU] via SUBCUTANEOUS
  Administered 2013-12-21 – 2013-12-22 (×2): 3 [IU] via SUBCUTANEOUS
  Administered 2013-12-22 – 2013-12-23 (×4): 4 [IU] via SUBCUTANEOUS
  Administered 2013-12-23: 7 [IU] via SUBCUTANEOUS
  Administered 2013-12-24: 4 [IU] via SUBCUTANEOUS
  Administered 2013-12-24: 7 [IU] via SUBCUTANEOUS

## 2013-12-19 MED ORDER — LEVOTHYROXINE SODIUM 100 MCG IV SOLR
25.0000 ug | Freq: Every day | INTRAVENOUS | Status: DC
Start: 2013-12-19 — End: 2013-12-21
  Administered 2013-12-19 – 2013-12-21 (×3): 25 ug via INTRAVENOUS
  Filled 2013-12-19 (×5): qty 5

## 2013-12-19 MED ORDER — KCL IN DEXTROSE-NACL 20-5-0.45 MEQ/L-%-% IV SOLN
INTRAVENOUS | Status: DC
  Administered 2013-12-19 – 2013-12-23 (×11): via INTRAVENOUS
  Filled 2013-12-19 (×13): qty 1000

## 2013-12-19 NOTE — Progress Notes (Signed)
Agree with above, will change ivf, leave foley in today, oob today, ancef can stop at 24 hours postop

## 2013-12-19 NOTE — Progress Notes (Addendum)
Inpatient Diabetes Program Recommendations  AACE/ADA: New Consensus Statement on Inpatient Glycemic Control (2013)  Target Ranges:  Prepandial:   less than 140 mg/dL      Peak postprandial:   less than 180 mg/dL (1-2 hours)      Critically ill patients:  140 - 180 mg/dL   Results for ARBY, HATT (MRN [id number]) as of 12/19/2013 10:33  Ref. Range 12/18/2013 06:44 12/18/2013 14:26 12/18/2013 21:35 12/19/2013 07:49  Glucose-Capillary Latest Range: 70-99 mg/dL 226 (H) 199 (H) 218 (H) 258 (H)   Diabetes history: Diabetes  Outpatient Diabetes medications: Levemir 120 units in the AM and 60 units in the PM  Current orders for Inpatient glycemic control: Novolog 0-20 AC, Novolog 0-5 HS   Inpatient Diabetes Program Recommendations Insulin - Basal: Noted patient remains NPO and on D5 1/2 NS @ 125 ml/hr. Please consider ordering Levemir 15 units BID and adjust accordingly. Correction (SSI): Please consider changing frequency of CBGs and Novolog correction to Q4H while NPO. HgbA1C: Please consider ordering an A1C to evaluate glycemic control over the past 2-3 months.  12/19/13@12 :66 - Spoke with patient about diabetes and home regimen for diabetes control.  Patient reports that he takes Levemir 120 units in the morning and Levemir 60 units at night for diabetes control as an outpatient. In inquiring about who helps him manage his diabetes, patient states that he use to see Dr. Loanne Drilling but got frustrated with the staff in his office and so he last saw him in August 2014.  Patient reports he is not really seeing any PCP or endocrinologist at this point.  Asked patient if I could consult Case Management to provide a list of local PCPs in the area. Patient states that is not necessary and that he would like to try to establish care with Dr. [name] ([facility]). Encouraged patient to call about making an initial appointment with Dr. [name] so he will have a follow up plan in place for medical management.  Inquired about  knowledge regarding an A1C and patient states that does not know what an A1C is.  Explained what an A1C is and discussed A1C results (8.5% on 09/02/13) with the patient. Discussed importance of checking CBGs and maintaining good CBG control to prevent long-term and short-term complications. Patient states that he checks his blood sugar at home at least twice a day and it ranges from 90-200 mg/dl usually.  Inquired about low blood glucose and patient reports that his blood glucose is low at least once a week on his current regimen. In discussing nutrition with the patient, he states that he usually only eats one meal a day. Encouraged patient to try to establish a routine of eating 3 times a day (even if it is small meals) to help with diabetes control. Asked that patient try to check his blood glucose 4 times a day (ACHS) and keep a record of readings to take with him to his follow up appointment with the PCP he establishes care with. Patient verbalized understanding of information discussed and reports that he has no further questions related to diabetes at this point.    Thanks, Barnie Alderman, RN, MSN, CCRN Diabetes Coordinator Inpatient Diabetes Program [phone] (Team Pager) [phone] ([facility] office) [phone] [facility] office)

## 2013-12-19 NOTE — Progress Notes (Signed)
Pt transferred to 6n02 via bed with oxygen and RN. All belongings sent with pt. Pt stated that there is no body to update on room change and that if pastor came by later to just let him know. VSS. All meds with patient and given to Terri, Therapist, sports.

## 2013-12-19 NOTE — Progress Notes (Signed)
Patient ID: [name], male   DOB: 31-Dec-1955, 58 y.o.   MRN: [id number]  Subjective: Low grade temp, WBC normal, on ancef.  Reinforced IS use and starting to ambulate.  No flatus. No n/v.  Minimal NGT output.   Objective:  Vital signs:  Filed Vitals:   12/19/13 0708 12/19/13 0744 12/19/13 0758 12/19/13 0800  BP: 127/58   130/57  Pulse: 101   98  Temp:  99 F (37.2 C) 100 F (37.8 C)   TempSrc:  Axillary Oral   Resp: 20   22  Height:      Weight:      SpO2: 95%   94%    Last BM Date: 12/15/13  Intake/Output   Yesterday:  04/01 0701 - 04/02 0700 In: 1861.4 [I.V.:1801.4; NG/GT:60] Out: 1685 [Urine:1195; Emesis/NG output:50; Drains:340; Blood:50] This shift:  Total I/O In: 300 [I.V.:200; IV Piggyback:100] Out: 607.5 [Urine:500; Emesis/NG output:50; Drains:57.5]   Physical Exam: General: Pt awake/alert/oriented x3 in no acute distress Chest: CTA.  No chest wall pain w good excursion CV:  Pulses intact.  Regular rhythm MS: Normal AROM mjr joints.  No obvious deformity Abdomen: Soft.  Nondistended. Appropriately tender.  Midline incision is c/d/i.  JP drains with serosanguinous output.  No evidence of peritonitis.  No incarcerated hernias.   Problem List:   Active Problems:   Incarcerated hernia   S/P repair of ventral hernia    Results:   Labs: Results for orders placed during the hospital encounter of 12/17/13 (from the past 48 hour(s))  CBC WITH DIFFERENTIAL     Status: Abnormal   Collection Time    12/17/13 10:28 AM      Result Value Ref Range   WBC 8.4  4.0 - 10.5 K/uL   RBC 4.18 (*) 4.22 - 5.81 MIL/uL   Hemoglobin 11.0 (*) 13.0 - 17.0 g/dL   HCT 34.4 (*) 39.0 - 52.0 %   MCV 82.3  78.0 - 100.0 fL   MCH 26.3  26.0 - 34.0 pg   MCHC 32.0  30.0 - 36.0 g/dL   RDW 15.4  11.5 - 15.5 %   Platelets 249  150 - 400 K/uL   Neutrophils Relative % 82 (*) 43 - 77 %   Neutro Abs 7.0  1.7 - 7.7 K/uL   Lymphocytes Relative 11 (*) 12 - 46 %   Lymphs Abs 0.9  0.7  - 4.0 K/uL   Monocytes Relative 6  3 - 12 %   Monocytes Absolute 0.5  0.1 - 1.0 K/uL   Eosinophils Relative 1  0 - 5 %   Eosinophils Absolute 0.1  0.0 - 0.7 K/uL   Basophils Relative 0  0 - 1 %   Basophils Absolute 0.0  0.0 - 0.1 K/uL  COMPREHENSIVE METABOLIC PANEL     Status: Abnormal   Collection Time    12/17/13 10:28 AM      Result Value Ref Range   Sodium 137  137 - 147 mEq/L   Potassium 4.7  3.7 - 5.3 mEq/L   Chloride 99  96 - 112 mEq/L   CO2 19  19 - 32 mEq/L   Glucose, Bld 367 (*) 70 - 99 mg/dL   BUN 23  6 - 23 mg/dL   Creatinine, Ser 1.27  0.50 - 1.35 mg/dL   Calcium 9.8  8.4 - 10.5 mg/dL   Total Protein 8.3  6.0 - 8.3 g/dL   Albumin 3.6  3.5 - 5.2 g/dL  AST 18  0 - 37 U/L   ALT 16  0 - 53 U/L   Alkaline Phosphatase 155 (*) 39 - 117 U/L   Total Bilirubin 0.3  0.3 - 1.2 mg/dL   GFR calc non Af Amer 62 (*) >90 mL/min   GFR calc Af Amer 71 (*) >90 mL/min   Comment: (NOTE)     The eGFR has been calculated using the CKD EPI equation.     This calculation has not been validated in all clinical situations.     eGFR's persistently <90 mL/min signify possible Chronic Kidney     Disease.  LIPASE, BLOOD     Status: None   Collection Time    12/17/13 10:28 AM      Result Value Ref Range   Lipase 24  11 - 59 U/L  CBG MONITORING, ED     Status: Abnormal   Collection Time    12/17/13 10:30 AM      Result Value Ref Range   Glucose-Capillary 329 (*) 70 - 99 mg/dL   Comment 1 Notify RN    I-STAT TROPOININ, ED     Status: None   Collection Time    12/17/13 10:33 AM      Result Value Ref Range   Troponin i, poc 0.03  0.00 - 0.08 ng/mL   Comment 3            Comment: Due to the release kinetics of cTnI,     a negative result within the first hours     of the onset of symptoms does not rule out     myocardial infarction with certainty.     If myocardial infarction is still suspected,     repeat the test at appropriate intervals.  CBG MONITORING, ED     Status: Abnormal    Collection Time    12/17/13 11:48 AM      Result Value Ref Range   Glucose-Capillary 323 (*) 70 - 99 mg/dL  CBG MONITORING, ED     Status: Abnormal   Collection Time    12/17/13 12:55 PM      Result Value Ref Range   Glucose-Capillary 331 (*) 70 - 99 mg/dL  CBG MONITORING, ED     Status: Abnormal   Collection Time    12/17/13  5:13 PM      Result Value Ref Range   Glucose-Capillary 197 (*) 70 - 99 mg/dL  CBC     Status: Abnormal   Collection Time    12/18/13 12:35 AM      Result Value Ref Range   WBC 6.4  4.0 - 10.5 K/uL   RBC 3.72 (*) 4.22 - 5.81 MIL/uL   Hemoglobin 9.6 (*) 13.0 - 17.0 g/dL   HCT 30.5 (*) 39.0 - 52.0 %   MCV 82.0  78.0 - 100.0 fL   MCH 25.8 (*) 26.0 - 34.0 pg   MCHC 31.5  30.0 - 36.0 g/dL   RDW 15.5  11.5 - 15.5 %   Platelets 234  150 - 400 K/uL  CREATININE, SERUM     Status: Abnormal   Collection Time    12/18/13 12:35 AM      Result Value Ref Range   Creatinine, Ser 1.27  0.50 - 1.35 mg/dL   GFR calc non Af Amer 62 (*) >90 mL/min   GFR calc Af Amer 71 (*) >90 mL/min   Comment: (NOTE)     The eGFR has been calculated  using the CKD EPI equation.     This calculation has not been validated in all clinical situations.     eGFR's persistently <90 mL/min signify possible Chronic Kidney     Disease.  BASIC METABOLIC PANEL     Status: Abnormal   Collection Time    12/18/13  6:00 AM      Result Value Ref Range   Sodium 140  137 - 147 mEq/L   Potassium 4.7  3.7 - 5.3 mEq/L   Chloride 104  96 - 112 mEq/L   CO2 23  19 - 32 mEq/L   Glucose, Bld 255 (*) 70 - 99 mg/dL   BUN 20  6 - 23 mg/dL   Creatinine, Ser 1.24  0.50 - 1.35 mg/dL   Calcium 8.7  8.4 - 10.5 mg/dL   GFR calc non Af Amer 63 (*) >90 mL/min   GFR calc Af Amer 73 (*) >90 mL/min   Comment: (NOTE)     The eGFR has been calculated using the CKD EPI equation.     This calculation has not been validated in all clinical situations.     eGFR's persistently <90 mL/min signify possible Chronic Kidney      Disease.  CBC     Status: Abnormal   Collection Time    12/18/13  6:00 AM      Result Value Ref Range   WBC 5.9  4.0 - 10.5 K/uL   RBC 3.58 (*) 4.22 - 5.81 MIL/uL   Hemoglobin 9.3 (*) 13.0 - 17.0 g/dL   HCT 29.5 (*) 39.0 - 52.0 %   MCV 82.4  78.0 - 100.0 fL   MCH 26.0  26.0 - 34.0 pg   MCHC 31.5  30.0 - 36.0 g/dL   RDW 15.6 (*) 11.5 - 15.5 %   Platelets 214  150 - 400 K/uL  GLUCOSE, CAPILLARY     Status: Abnormal   Collection Time    12/18/13  6:44 AM      Result Value Ref Range   Glucose-Capillary 226 (*) 70 - 99 mg/dL   Comment 1 Documented in Chart     Comment 2 Notify RN    GLUCOSE, CAPILLARY     Status: Abnormal   Collection Time    12/18/13  2:26 PM      Result Value Ref Range   Glucose-Capillary 199 (*) 70 - 99 mg/dL  MRSA PCR SCREENING     Status: None   Collection Time    12/18/13  5:52 PM      Result Value Ref Range   MRSA by PCR NEGATIVE  NEGATIVE   Comment:            The GeneXpert MRSA Assay (FDA     approved for NASAL specimens     only), is one component of a     comprehensive MRSA colonization     surveillance program. It is not     intended to diagnose MRSA     infection nor to guide or     monitor treatment for     MRSA infections.  GLUCOSE, CAPILLARY     Status: Abnormal   Collection Time    12/18/13  9:35 PM      Result Value Ref Range   Glucose-Capillary 218 (*) 70 - 99 mg/dL   Comment 1 Notify RN     Comment 2 Documented in Chart    BASIC METABOLIC PANEL     Status: Abnormal  Collection Time    12/19/13  2:58 AM      Result Value Ref Range   Sodium 143  137 - 147 mEq/L   Potassium 5.2  3.7 - 5.3 mEq/L   Chloride 106  96 - 112 mEq/L   CO2 24  19 - 32 mEq/L   Glucose, Bld 244 (*) 70 - 99 mg/dL   BUN 18  6 - 23 mg/dL   Creatinine, Ser 1.48 (*) 0.50 - 1.35 mg/dL   Calcium 8.4  8.4 - 10.5 mg/dL   GFR calc non Af Amer 51 (*) >90 mL/min   GFR calc Af Amer 59 (*) >90 mL/min   Comment: (NOTE)     The eGFR has been calculated using the CKD  EPI equation.     This calculation has not been validated in all clinical situations.     eGFR's persistently <90 mL/min signify possible Chronic Kidney     Disease.  CBC     Status: Abnormal   Collection Time    12/19/13  2:58 AM      Result Value Ref Range   WBC 7.2  4.0 - 10.5 K/uL   RBC 3.87 (*) 4.22 - 5.81 MIL/uL   Hemoglobin 10.0 (*) 13.0 - 17.0 g/dL   HCT 32.5 (*) 39.0 - 52.0 %   MCV 84.0  78.0 - 100.0 fL   MCH 25.8 (*) 26.0 - 34.0 pg   MCHC 30.8  30.0 - 36.0 g/dL   RDW 15.7 (*) 11.5 - 15.5 %   Platelets 238  150 - 400 K/uL  GLUCOSE, CAPILLARY     Status: Abnormal   Collection Time    12/19/13  7:49 AM      Result Value Ref Range   Glucose-Capillary 258 (*) 70 - 99 mg/dL   Comment 1 Notify RN     Comment 2 Documented in Chart      Imaging / Studies: Ct Abdomen Pelvis Wo Contrast  12/17/2013   CLINICAL DATA:  Abdominal pain with nausea.  EXAM: CT ABDOMEN AND PELVIS WITHOUT CONTRAST  TECHNIQUE: Multidetector CT imaging of the abdomen and pelvis was performed following the standard protocol without intravenous contrast.  COMPARISON:  Radiographs dated 01/21/2013 and CT scan dated 08/28/2007  FINDINGS: The patient has a partial small bowel obstruction which is due to a periumbilical hernia containing loops of small bowel. The distal ileum is contained within the hernia sac. The ileum is dilated proximal to the hernia and is not dilated distal to the hernia.  The colon is not distended.  Liver, spleen, pancreas, adrenal glands, and kidneys appear normal. There is a 3 cm stone in the gallbladder. Gallbladder otherwise appears normal. No dilated bile ducts.  The patient has a lap band in place at the gastroesophageal junction.  No acute osseous abnormalities. Deformity and posttraumatic osteoarthritis of the right hip is present. There are pins in both femoral necks.  IMPRESSION: 1. Partial small bowel obstruction due to a periumbilical hernia containing distal ileum. 2. Cholelithiasis.    Electronically Signed   By: Rozetta Nunnery M.D.   On: 12/17/2013 13:59   Dg Abd 2 Views  12/17/2013   CLINICAL DATA:  Partial small bowel obstruction.  EXAM: ABDOMEN - 2 VIEW  COMPARISON:  CT scan dated 12/17/2013  FINDINGS: NG tube has been inserted and the tip is in the fundus of the stomach. There are multiple dilated loops of small bowel consistent with small bowel obstruction. The colon is not  distended. No free air. Lap band is noted.  IMPRESSION: Persistent small bowel obstruction pattern.   Electronically Signed   By: Rozetta Nunnery M.D.   On: 12/17/2013 16:34    Medications / Allergies: per chart  Antibiotics: Anti-infectives   Start     Dose/Rate Route Frequency Ordered Stop   12/18/13 1900  ceFAZolin (ANCEF) IVPB 2 g/50 mL premix     2 g 100 mL/hr over 30 Minutes Intravenous 3 times per day 12/18/13 1754        Assessment/Plan Incarcerated incisional hernia S/p open hernia repair, mesh and LOA, blake drain placement x2 POD#1 Continue NGT and await bowel function IVF Mobilize IS SCDs/lovenox DC foley Continue  Dilaudid PCA Drain care Transfer to floor Ancef---> Diabetes mellitus CBGs bit high, continue humalog.   Will resume levemir when tolerating orals  Erby Pian, [facility] Surgery Pager [phone] Office [phone]  12/19/2013 9:21 AM

## 2013-12-19 NOTE — Progress Notes (Signed)
Utilization review completed.  

## 2013-12-20 LAB — GLUCOSE, CAPILLARY
GLUCOSE-CAPILLARY: 230 mg/dL — AB (ref 70–99)
GLUCOSE-CAPILLARY: 160 mg/dL — AB (ref 70–99)
GLUCOSE-CAPILLARY: 153 mg/dL — AB (ref 70–99)
Glucose-Capillary: 231 mg/dL — ABNORMAL HIGH (ref 70–99)

## 2013-12-20 LAB — CBC
HCT: 29.5 % — ABNORMAL LOW (ref 39.0–52.0)
Hemoglobin: 9.3 g/dL — ABNORMAL LOW (ref 13.0–17.0)
MCH: 26.4 pg (ref 26.0–34.0)
MCHC: 31.5 g/dL (ref 30.0–36.0)
MCV: 83.8 fL (ref 78.0–100.0)
PLATELETS: 202 10*3/uL (ref 150–400)
RBC: 3.52 MIL/uL — AB (ref 4.22–5.81)
RDW: 15.7 % — ABNORMAL HIGH (ref 11.5–15.5)
WBC: 7.3 10*3/uL (ref 4.0–10.5)

## 2013-12-20 LAB — BASIC METABOLIC PANEL
BUN: 20 mg/dL (ref 6–23)
CALCIUM: 8.5 mg/dL (ref 8.4–10.5)
CHLORIDE: 105 meq/L (ref 96–112)
CO2: 25 meq/L (ref 19–32)
Creatinine, Ser: 1.52 mg/dL — ABNORMAL HIGH (ref 0.50–1.35)
GFR calc Af Amer: 57 mL/min — ABNORMAL LOW (ref >90)
GFR calc non Af Amer: 50 mL/min — ABNORMAL LOW (ref >90)
Glucose, Bld: 234 mg/dL — ABNORMAL HIGH (ref 70–99)
Potassium: 4.9 mEq/L (ref 3.7–5.3)
SODIUM: 141 meq/L (ref 137–147)

## 2013-12-20 NOTE — Progress Notes (Signed)
Inpatient Diabetes Program Recommendations  AACE/ADA: New Consensus Statement on Inpatient Glycemic Control (2013)  Target Ranges:  Prepandial:   less than 140 mg/dL      Peak postprandial:   less than 180 mg/dL (1-2 hours)      Critically ill patients:  140 - 180 mg/dL     Results for GAVON, CHRISTON (MRN [id number]) as of 12/20/2013 12:39  Ref. Range 12/19/2013 07:49 12/19/2013 12:00 12/19/2013 17:32 12/19/2013 21:37  Glucose-Capillary Latest Range: 70-99 mg/dL 258 (H) 207 (H) 227 (H) 234 (H)    Results for AHMON, GAIR (MRN [id number]) as of 12/20/2013 12:39  Ref. Range 12/20/2013 07:43 12/20/2013 11:38  Glucose-Capillary Latest Range: 70-99 mg/dL 230 (H) 231 (H)     Home DM meds: Levemir 120 units in the AM and 60 units in the PM   Patient still having glucose elevations.  Currently NPO.   MD- Please consider the following insulin adjustments:  1. Increase Levemir to 50 units daily 2. Change Novolog SSi to Q4 hour coverage (currently ordered tid ac + HS and pt is NPO)   Will follow. Wyn Quaker RN, MSN, CDE Diabetes Coordinator Inpatient Diabetes Program Team Pager: [phone] (8a-10p)

## 2013-12-20 NOTE — Progress Notes (Signed)
Patient ID: Brynn Oswald, male   DOB: 21-Apr-1956, 58 y.o.   MRN: [id number] 2 Days Post-Op  Subjective: No major complaints this morning except he wants his NG tube out. No nausea. No flatus yet. Good pain control with PCA. Has been up out of bed.  Objective: Vital signs in last 24 hours: Temp:  [98.1 F (36.7 C)-100.1 F (37.8 C)] 99.9 F (37.7 C) (04/03 0547) Pulse Rate:  [90-95] 93 (04/03 0547) Resp:  [13-27] 16 (04/03 0800) BP: (128-159)/(53-84) 159/63 mmHg (04/03 0547) SpO2:  [93 %-99 %] 98 % (04/03 0800) Last BM Date: 12/15/13  Intake/Output from previous day: 04/02 0701 - 04/03 0700 In: 4095 [I.V.:3946; IV Piggyback:100] Out: 2242.5 [Urine:1700; Emesis/NG output:300; Drains:242.5] Intake/Output this shift: Total I/O In: -  Out: 330 [Urine:300; Drains:30]  General appearance: alert, cooperative, no distress and morbidly obese Resp: no wheezing or increased work of breathing GI: normal findings: soft, non-tender Incision/Wound: dressings clean and dry. Serous JP drainage.  Lab Results:   Recent Labs  12/19/13 0258 12/20/13 0335  WBC 7.2 7.3  HGB 10.0* 9.3*  HCT 32.5* 29.5*  PLT 238 202   BMET  Recent Labs  12/19/13 0258 12/20/13 0335  NA 143 141  K 5.2 4.9  CL 106 105  CO2 24 25  GLUCOSE 244* 234*  BUN 18 20  CREATININE 1.48* 1.52*  CALCIUM 8.4 8.5     Studies/Results: No results found.  Anti-infectives: Anti-infectives   Start     Dose/Rate Route Frequency Ordered Stop   12/18/13 1900  ceFAZolin (ANCEF) IVPB 2 g/50 mL premix  Status:  Discontinued     2 g 100 mL/hr over 30 Minutes Intravenous 3 times per day 12/18/13 1754 12/19/13 1626      Assessment/Plan: s/p Procedure(s):  REPAIR OF INCARCERATED INCISIONAL HERNIA INSERTION OF MESH Stable postoperatively. NG has minimal drainage (patient has a LAP-BAND), will discontinue. Discontinue Foley. N.p.o. Except ice chips for now. Continue mobilization and pulmonary toilet.   LOS: 3 days     Nainoa Woldt T 12/20/2013

## 2013-12-20 NOTE — Progress Notes (Signed)
NG tube and foley catheter discontinued per MD order.  Pt. Tolerated well and has no complaints.  Will continue to monitor. Syliva Overman

## 2013-12-21 LAB — GLUCOSE, CAPILLARY
Glucose-Capillary: 205 mg/dL — ABNORMAL HIGH (ref 70–99)
Glucose-Capillary: 183 mg/dL — ABNORMAL HIGH (ref 70–99)
Glucose-Capillary: 149 mg/dL — ABNORMAL HIGH (ref 70–99)
Glucose-Capillary: 139 mg/dL — ABNORMAL HIGH (ref 70–99)

## 2013-12-21 MED ORDER — LEVOTHYROXINE SODIUM 50 MCG PO TABS: 50.0000 ug | ORAL_TABLET | Freq: Every day | ORAL | Status: DC

## 2013-12-21 MED ORDER — FUROSEMIDE 40 MG PO TABS
40.0000 mg | ORAL_TABLET | Freq: Two times a day (BID) | ORAL | Status: DC
  Administered 2013-12-21 – 2013-12-24 (×6): 40 mg via ORAL
  Filled 2013-12-21 (×9): qty 1

## 2013-12-21 MED ORDER — LEVOTHYROXINE SODIUM 50 MCG PO TABS
50.0000 ug | ORAL_TABLET | Freq: Every day | ORAL | Status: DC
  Administered 2013-12-22 – 2013-12-24 (×3): 50 ug via ORAL
  Filled 2013-12-21 (×5): qty 1

## 2013-12-21 MED ORDER — LISINOPRIL 5 MG PO TABS
5.0000 mg | ORAL_TABLET | Freq: Every day | ORAL | Status: DC
  Administered 2013-12-22 – 2013-12-24 (×3): 5 mg via ORAL
  Filled 2013-12-21 (×5): qty 1

## 2013-12-21 NOTE — Progress Notes (Signed)
Patient ID: Flemming Egleston, male   DOB: February 25, 1956, 58 y.o.   MRN: [id number] 3 Days Post-Op  Subjective: Generally doing very well. Incisional pain well controlled with meds. No nausea. His voiding and passing gas. Up in chair. He is having some soreness in his left knee.  Objective: Vital signs in last 24 hours: Temp:  [97.8 F (36.6 C)-99.6 F (37.6 C)] 97.8 F (36.6 C) (04/04 1000) Pulse Rate:  [74-92] 74 (04/04 1000) Resp:  [13-20] 13 (04/04 1159) BP: (131-162)/(56-73) 131/56 mmHg (04/04 1000) SpO2:  [94 %-99 %] 99 % (04/04 1159) FiO2 (%):  [38 %-41 %] 41 % (04/04 0400) Last BM Date: 12/15/13  Intake/Output from previous day: 04/03 0701 - 04/04 0700 In: 1200 [I.V.:1200] Out: 1940 [Urine:1825; Drains:115] Intake/Output this shift:    General appearance: alert, cooperative, no distress and in good spirits GI: appropriate incisional tenderness. Extremities: Homans sign is negative, no sign of DVT Incision/Wound: dressing clean and dry. JP drainage serosanguineous  Lab Results:   Recent Labs  12/19/13 0258 12/20/13 0335  WBC 7.2 7.3  HGB 10.0* 9.3*  HCT 32.5* 29.5*  PLT 238 202   BMET  Recent Labs  12/19/13 0258 12/20/13 0335  NA 143 141  K 5.2 4.9  CL 106 105  CO2 24 25  GLUCOSE 244* 234*  BUN 18 20  CREATININE 1.48* 1.52*  CALCIUM 8.4 8.5     Studies/Results: No results found.  Anti-infectives: Anti-infectives   Start     Dose/Rate Route Frequency Ordered Stop   12/18/13 1900  ceFAZolin (ANCEF) IVPB 2 g/50 mL premix  Status:  Discontinued     2 g 100 mL/hr over 30 Minutes Intravenous 3 times per day 12/18/13 1754 12/19/13 1626      Assessment/Plan: s/p Procedure(s):  REPAIR OF INCARCERATED INCISIONAL HERNIA INSERTION OF MESH Doing well postoperatively. Start clear liquid diet and to reduce IV fluids. DVT prophylaxis-on Lovenox Mobilize as much as possible.   LOS: 4 days    Paysen Goza T 12/21/2013

## 2013-12-22 LAB — GLUCOSE, CAPILLARY
GLUCOSE-CAPILLARY: 157 mg/dL — AB (ref 70–99)
Glucose-Capillary: 155 mg/dL — ABNORMAL HIGH (ref 70–99)
Glucose-Capillary: 148 mg/dL — ABNORMAL HIGH (ref 70–99)
Glucose-Capillary: 138 mg/dL — ABNORMAL HIGH (ref 70–99)

## 2013-12-22 MED ORDER — OXYCODONE-ACETAMINOPHEN 5-325 MG PO TABS
1.0000 | ORAL_TABLET | ORAL | Status: DC | PRN
  Administered 2013-12-23 – 2013-12-24 (×5): 2 via ORAL
  Filled 2013-12-22 (×5): qty 2

## 2013-12-22 NOTE — Progress Notes (Signed)
Orthopedic Tech Progress Note Patient Details:  [name] [birth year] [id number]  Ortho Devices Type of Ortho Device: Abdominal binder Ortho Device/Splint Location: 2 Abdominl binders Ortho Device/Splint Interventions: Ordered   [name] 12/22/2013, 10:32 AM

## 2013-12-22 NOTE — Progress Notes (Signed)
Patient ID: [name], male   DOB: 1956-09-18, 58 y.o.   MRN: [id number] 4 Days Post-Op  Subjective: No complaints this morning. Pain is gradually improving. Tolerating clear liquids without nausea. He has had flatus but no bowel movements. Was out of bed a large part of yesterday.  Objective: Vital signs in last 24 hours: Temp:  [98.2 F (36.8 C)-98.8 F (37.1 C)] 98.2 F (36.8 C) (04/05 0602) Pulse Rate:  [68-78] 77 (04/05 0602) Resp:  [13-19] 18 (04/05 0742) BP: (112-138)/(49-77) 127/64 mmHg (04/05 0602) SpO2:  [97 %-100 %] 98 % (04/05 0742) Last BM Date: 12/15/13  Intake/Output from previous day: 04/04 0701 - 04/05 0700 In: 3142.5 [I.V.:3142.5] Out: [id number] [Urine:3380; Drains:45] Intake/Output this shift:    General appearance: alert, cooperative, no distress and morbidly obese GI: normal findings: soft, non-tender and JP drainage is serosanguineous Incision/Wound: incision is clean and dry without erythema or other signs of infection  Lab Results:   Recent Labs  12/20/13 0335  WBC 7.3  HGB 9.3*  HCT 29.5*  PLT 202   BMET  Recent Labs  12/20/13 0335  NA 141  K 4.9  CL 105  CO2 25  GLUCOSE 234*  BUN 20  CREATININE 1.52*  CALCIUM 8.5     Studies/Results: No results found.  Anti-infectives: Anti-infectives   Start     Dose/Rate Route Frequency Ordered Stop   12/18/13 1900  ceFAZolin (ANCEF) IVPB 2 g/50 mL premix  Status:  Discontinued     2 g 100 mL/hr over 30 Minutes Intravenous 3 times per day 12/18/13 1754 12/19/13 1626      Assessment/Plan: s/p Procedure(s):  REPAIR OF INCARCERATED INCISIONAL HERNIA INSERTION OF MESH Doing well. Advanced to full liquid diet. Dry oral pain medication to see if we can get off the PCA.   LOS: 5 days    [name] T 12/22/2013

## 2013-12-23 LAB — GLUCOSE, CAPILLARY
GLUCOSE-CAPILLARY: 211 mg/dL — AB (ref 70–99)
Glucose-Capillary: 164 mg/dL — ABNORMAL HIGH (ref 70–99)
Glucose-Capillary: 178 mg/dL — ABNORMAL HIGH (ref 70–99)
Glucose-Capillary: 165 mg/dL — ABNORMAL HIGH (ref 70–99)

## 2013-12-23 MED ORDER — BISOPROLOL FUMARATE 10 MG PO TABS
10.0000 mg | ORAL_TABLET | Freq: Every day | ORAL | Status: DC
  Administered 2013-12-23 – 2013-12-24 (×2): 10 mg via ORAL
  Filled 2013-12-23 (×2): qty 1

## 2013-12-23 MED ORDER — ASPIRIN EC 325 MG PO TBEC
325.0000 mg | DELAYED_RELEASE_TABLET | Freq: Every day | ORAL | Status: DC
  Administered 2013-12-23 – 2013-12-24 (×2): 325 mg via ORAL
  Filled 2013-12-23 (×2): qty 1

## 2013-12-23 MED ORDER — SIMVASTATIN 40 MG PO TABS
40.0000 mg | ORAL_TABLET | Freq: Every day | ORAL | Status: DC
  Administered 2013-12-23: 40 mg via ORAL
  Filled 2013-12-23 (×2): qty 1

## 2013-12-23 NOTE — Progress Notes (Signed)
5 Days Post-Op  Subjective: Pt doing well today.  Still on PCA.  Tol FLD.  Passing flatus, no BM  Objective: Vital signs in last 24 hours: Temp:  [98.1 F (36.7 C)-98.7 F (37.1 C)] 98.2 F (36.8 C) (04/06 0641) Pulse Rate:  [72-77] 72 (04/06 0641) Resp:  [17-21] 20 (04/06 0641) BP: (112-127)/(58-76) 123/60 mmHg (04/06 0641) SpO2:  [97 %-100 %] 98 % (04/06 0641) FiO2 (%):  [41 %] 41 % (04/06 0000) Last BM Date: 12/15/13  Intake/Output from previous day: 04/05 0701 - 04/06 0700 In: 3615 [P.O.:2040; I.V.:1575] Out: 4880 [Urine:4750; Drains:130] Intake/Output this shift: Total I/O In: -  Out: 325 [Urine:325]  General appearance: alert and cooperative Resp: clear to auscultation bilaterally Cardio: regular rate and rhythm, S1, S2 normal, no murmur, click, rub or gallop GI: wound c/d/i, active BS, JP SS    Assessment/Plan: s/p Procedure(s):  REPAIR OF INCARCERATED INCISIONAL HERNIA (N/A) INSERTION OF MESH (N/A) Advance diet DC PCA PO Pain Rx Hopefully home later today Drain education  LOS: 6 days    Rosario Jacks., Anne Hahn 12/23/2013

## 2013-12-23 NOTE — Progress Notes (Signed)
Patient seen by Dr. Rosendo Gros.  [name]. [name], MD, [place] [phone] [facility] Surgery

## 2013-12-23 NOTE — Progress Notes (Signed)
Wants to go home, tomorrow/  He plans to use [employer] bus.  He wants a bariatric lift chair, I have left a Rx but told him he would have to discuss with his insurance company.  I have restarted his Zebeta and stopped the IV lopressor.  Prior to Admission medications   Medication Sig Start Date End Date Taking? Authorizing Provider  aspirin EC 325 MG tablet Take 325 mg by mouth daily.   Yes Historical Provider, MD  bisoprolol (ZEBETA) 10 MG tablet Take 10 mg by mouth daily.   Yes Historical Provider, MD  cycloSPORINE (RESTASIS) 0.05 % ophthalmic emulsion Place 1 drop into both eyes 2 (two) times daily. 05/24/13  Yes Neena Rhymes, MD  furosemide (LASIX) 40 MG tablet Take 40 mg by mouth 2 (two) times daily.   Yes Historical Provider, MD  HYDROcodone-acetaminophen (NORCO) 10-325 MG per tablet Take 1 tablet by mouth 3 (three) times daily as needed for moderate pain. MAY FILL ON 12/13/13 10/15/13  Yes Neena Rhymes, MD  insulin detemir (LEVEMIR) 100 UNIT/ML injection Inject 60-120 Units into the skin 2 (two) times daily. 120 units in the am and 60 units at night. 01/11/13 01/11/14 Yes [name], MD  levothyroxine (SYNTHROID, LEVOTHROID) 50 MCG tablet Take 50 mcg by mouth daily before breakfast.   Yes Historical Provider, MD  lisinopril (PRINIVIL,ZESTRIL) 5 MG tablet Take 1 tablet (5 mg total) by mouth daily. 07/16/13  Yes Larey Dresser, MD  meloxicam (MOBIC) 15 MG tablet Take 15 mg by mouth daily as needed for pain.    Yes Historical Provider, MD  polyethylene glycol powder (GLYCOLAX/MIRALAX) powder Take 17 g by mouth daily as needed (for constipation).  02/27/12  Yes Neena Rhymes, MD  simvastatin (ZOCOR) 40 MG tablet Take 1 tablet (40 mg total) by mouth at bedtime. 11/13/12  Yes Neena Rhymes, MD  terbinafine (LAMISIL) 250 MG tablet Take 1 tablet (250 mg total) by mouth daily. 10/21/13  Yes Neena Rhymes, MD  vitamin C (ASCORBIC ACID) 500 MG tablet Take 1,000 mg by mouth daily.   Yes  Historical Provider, MD  vitamin E 200 UNIT capsule Take 200 Units by mouth daily.   Yes Historical Provider, MD

## 2013-12-24 LAB — GLUCOSE, CAPILLARY
Glucose-Capillary: 182 mg/dL — ABNORMAL HIGH (ref 70–99)
Glucose-Capillary: 238 mg/dL — ABNORMAL HIGH (ref 70–99)

## 2013-12-24 MED ORDER — HYDROCODONE-ACETAMINOPHEN 10-325 MG PO TABS: 1.0000 | ORAL_TABLET | ORAL | Status: DC | PRN

## 2013-12-24 NOTE — Discharge Instructions (Signed)
Bulb Drain Home Care °A bulb drain consists of a thin rubber tube and a soft, round bulb that creates a gentle suction. The rubber tube is placed in the area where you had surgery. A bulb is attached to the end of the tube that is outside the body. The bulb drain removes excess fluid that normally builds up in a surgical wound after surgery. The color and amount of fluid will vary. Immediately after surgery, the fluid is bright red and is a little thicker than water. It may gradually change to a yellow or pink color and become more thin and water-like. When the amount decreases to about 1 or 2 tbsp in 24 hours, your health care provider will usually remove it. °DAILY CARE °· Keep the bulb flat (compressed) at all times, except while emptying it. The flatness creates suction. You can flatten the bulb by squeezing it firmly in the middle and then closing the cap. °· Keep sites where the tube enters the skin dry and covered with a bandage (dressing). °· Secure the tube 1 2 in (2.5 5.1 cm) below the insertion sites, to keep it from pulling on your stitches. The tube is stitched in place and will not slip out. °· Secure the bulb as directed by your health care provider. °· For the first 3 days after surgery, there usually is more fluid in the bulb. Empty the bulb whenever it becomes half full because the bulb does not create enough suction if it is too full. The bulb could also overflow. Write down how much fluid you remove each time you empty your drain. Add up the amount removed in 24 hours. °· Empty the bulb at the same time every day once the amount of fluid decreases and you only need to empty it once a day. Write down the amounts and the 24-hour totals to give to your health care provider. This helps your health care provider know when the tubes can be removed. °EMPTYING THE BULB DRAIN °Before emptying the bulb, get a measuring cup, a piece of paper, and a pen and wash your hands. °· Gently run your fingers down  the tube (stripping) to empty any drainage from the tubing into the bulb. This may need to be done several times a day to clear the tubing of clots and tissue. °· Open the bulb cap to release suction, which causes it to inflate. Do not touch the inside of the cap. °· Gently run your fingers down the tube (stripping) to empty any drainage from the tubing into the bulb. °· Hold the cap out of the way, and pour fluid into the measuring cup.   °· Squeeze the bulb to provide suction.  °· Replace the cap.   °· Check the tape that holds the tube to your skin. If it is becoming loose, you can remove the loose piece of tape and apply a new one. Then, pin the bulb to your shirt.   °· Write down the amount of fluid you emptied out. Write down the date and each time you emptied your bulb drain. (If there are 2 bulbs, note the amount of drainage from each bulb and keep the totals separate. Your health care provider will want to know the total amounts for each drain and which tube is draining more.)   °· Flush the fluid down the toilet and wash your hands.   °· Call your health care provider once you have less than 2 tbsp of fluid collecting in the bulb drain every 24   hours. °If there is drainage around the tube site, change dressings and keep the area dry. Cleanse around tube with sterile saline and place dry gauze around site. This gauze should be changed when it is soiled. If it stays clean and unsoiled, it should still be changed daily.  °SEEK MEDICAL CARE IF: °· Your drainage has a bad smell or is cloudy.   °· You have a fever.   °· Your drainage is increasing instead of decreasing.   °· Your tube fell out.   °· You have redness or swelling around the tube site.   °· You have drainage from a surgical wound.   °· Your bulb drain will not stay flat after you empty it.   °MAKE SURE YOU:  °· Understand these instructions. °· Will watch your condition. °· Will get help right away if you are not doing well or get worse. °Document  Released: 09/02/2000 Document Revised: 06/26/2013 Document Reviewed: 02/08/2012 °[employer]® Patient Information ©2014 [employer]. ° ° °

## 2013-12-24 NOTE — Discharge Summary (Signed)
Patient ID: Jadakiss Kump MRN: [id number] DOB/AGE: 1956-05-24 58 y.o.  Admit date: 12/17/2013 Discharge date: 12/24/2013  Procedures:  #1 lysis of adhesions x1 hour  #2 open incisional hernia repair, preperitoneal with 20 x 25 Ventralight mesh Dr. Donne Hazel 12-18-13  Consults: None  Reason for Admission: 58 yom who is morbidly obese and has undergone sigmoid colectomy (for colon cancer also treated with chemotherapy by his report in 2007), lap band placement in 2011 at outside institution who presents with less than 24 hour history of abdominal pain, nausea and vomiting. He is not passing really any flatus or bm today. He came in today due to nothing at home helping to be evaluated.  His diabetes is usually under control with sugars in 90s-low 100s.  Admission Diagnoses:  1. Incarcerated incisional hernia with PSBO 2. DM 3. HTN 4. Morbid obesity  Hospital Course: The patient was admitted and an NGT was placed for decompression.  The next day he was taken to the OR where he underwent the above procedure.  He tolerated this procedure well.  Due to his prior obstruction, hid NGT was left in place after surgery.  He had 2 JP drains placed.  He had a mild post op ileus.  His NGT was able to be taken out on POD 2, though.  He was given clears on POD 3.  His diet was then advanced as tolerated.  His right JP drain had minimal output and was removed on POD 6 prior to dc home.  He was otherwise stable at this time for dc home.  PE: Abd: soft, both JP drains with serosang output.  Right drain removed.  +BS, incision c/d/i with staples  Discharge Diagnoses:  Active Problems:   Incarcerated hernia   S/P repair of ventral hernia   Discharge Medications:   Medication List         aspirin EC 325 MG tablet  Take 325 mg by mouth daily.     bisoprolol 10 MG tablet  Commonly known as:  ZEBETA  Take 10 mg by mouth daily.     cycloSPORINE 0.05 % ophthalmic emulsion  Commonly known as:  RESTASIS   Place 1 drop into both eyes 2 (two) times daily.     furosemide 40 MG tablet  Commonly known as:  LASIX  Take 40 mg by mouth 2 (two) times daily.     HYDROcodone-acetaminophen 10-325 MG per tablet  Commonly known as:  NORCO  Take 1 tablet by mouth every 4 (four) hours as needed for moderate pain. MAY FILL ON 12/13/13     insulin detemir 100 UNIT/ML injection  Commonly known as:  LEVEMIR  Inject 60-120 Units into the skin 2 (two) times daily. 120 units in the am and 60 units at night.     levothyroxine 50 MCG tablet  Commonly known as:  SYNTHROID, LEVOTHROID  Take 50 mcg by mouth daily before breakfast.     lisinopril 5 MG tablet  Commonly known as:  PRINIVIL,ZESTRIL  Take 1 tablet (5 mg total) by mouth daily.     meloxicam 15 MG tablet  Commonly known as:  MOBIC  Take 15 mg by mouth daily as needed for pain.     polyethylene glycol powder powder  Commonly known as:  GLYCOLAX/MIRALAX  Take 17 g by mouth daily as needed (for constipation).     simvastatin 40 MG tablet  Commonly known as:  ZOCOR  Take 1 tablet (40 mg total) by mouth at bedtime.  terbinafine 250 MG tablet  Commonly known as:  LAMISIL  Take 1 tablet (250 mg total) by mouth daily.     vitamin C 500 MG tablet  Commonly known as:  ASCORBIC ACID  Take 1,000 mg by mouth daily.     vitamin E 200 UNIT capsule  Take 200 Units by mouth daily.        Discharge Instructions:     Follow-up Information   Follow up with Rolm Bookbinder, MD On 12/30/2013. (arrive at 2:00pm for a 2:30pm appointment)    Specialty:  General Surgery   Contact information:   [place] [address] [phone]       Signed: Henreitta Cea 12/24/2013, 11:38 AM

## 2013-12-24 NOTE — Progress Notes (Signed)
Patient given discharge instructions and prescriptions.  Pt verbalized understanding of all instructions and follow-up information.  Reinforced JP drain teaching with patient and he demonstrated emptying the drain.  No other questions or concerns at this time.  Pt taken down for discharge via wheelchair for transport home with [facility].

## 2013-12-24 NOTE — Progress Notes (Signed)
Patient evaluated for community based chronic disease management services with [facility] as a benefit of patient's [employer]. Spoke with patient at bedside to explain [facility] services.  Services have been accepted and written consents obtained.  Patient request assistance with obtaining a CPAP machine and a lift chair if eligible.  Patient will receive a post discharge transition of care call and will be evaluated for monthly home visits for assessments and disease process education.  Left contact information and [facility] literature at bedside. Made Inpatient Case Manager aware that [place] [facility] following. Of note, [facility] Management services does not replace or interfere with any services that are arranged by inpatient case management or social work.  For additional questions or referrals please contact Corliss Blacker BSN RN [facility]iaison at [phone].

## 2013-12-26 ENCOUNTER — Telehealth (INDEPENDENT_AMBULATORY_CARE_PROVIDER_SITE_OTHER): Payer: Self-pay

## 2013-12-26 NOTE — Telephone Encounter (Signed)
Pt called in concerned he is draining more than he should. On average he is draining 44-50 cc a day. Advised this is normal after surgery. He will start to see the number decrease in time. Pt understanding.

## 2013-12-30 ENCOUNTER — Encounter (INDEPENDENT_AMBULATORY_CARE_PROVIDER_SITE_OTHER): Payer: Medicare PPO | Admitting: General Surgery

## 2013-12-30 ENCOUNTER — Telehealth (INDEPENDENT_AMBULATORY_CARE_PROVIDER_SITE_OTHER): Payer: Self-pay

## 2013-12-30 NOTE — Telephone Encounter (Signed)
Patient is very upset he was rescheduled to 3p for today , he requires [facility] P/U, He canceled for today and rescheduled for tomorrow 3pm.

## 2013-12-31 ENCOUNTER — Ambulatory Visit (INDEPENDENT_AMBULATORY_CARE_PROVIDER_SITE_OTHER): Payer: Medicare PPO | Admitting: General Surgery

## 2013-12-31 ENCOUNTER — Encounter (INDEPENDENT_AMBULATORY_CARE_PROVIDER_SITE_OTHER): Payer: Self-pay | Admitting: General Surgery

## 2013-12-31 VITALS — BP 142/100 | HR 60 | Temp 98.3°F | Resp 16 | Ht 72.0 in | Wt >= 6400 oz

## 2013-12-31 DIAGNOSIS — Z4889 Encounter for other specified surgical aftercare: Secondary | ICD-10-CM

## 2013-12-31 NOTE — Patient Instructions (Signed)
Clean around drain site with peroxide daily. When drain output is 30 cc or less per day, call and make an appointment to have drain removed.

## 2013-12-31 NOTE — Progress Notes (Signed)
Procedure:  Urgent repair of incarcerated ventral hernia with mesh  Date:  12/18/2013  History:  He is here for a postoperative visit. He has a remaining drain in. It is draining 40 cc per day. He is otherwise doing okay. He has some chills but no obvious fever.  Exam: General- Morbidly obese in NAD. Abdomen-obese, midline incision with staples in. Drain output is thin and serosanguineous. Superior staples looks like thyroid to come out. Inferior staples are not.  Assessment:  Drain output still too high for removal. Wound healing slowly but satisfactorily.  Plan:  Remove staples from the upper wound. Return visit one week. He was instructed to call if the drain output was 30 cc or less so we can arrange for him to have the drain removed at that time. I instructed him to clean the drain site with peroxide daily.

## 2014-01-03 ENCOUNTER — Telehealth: Payer: Self-pay | Admitting: Internal Medicine

## 2014-01-03 ENCOUNTER — Telehealth: Payer: Self-pay | Admitting: Pulmonary Disease

## 2014-01-03 NOTE — Telephone Encounter (Signed)
Per OV 12/16/13: Patient Instructions      Will try and locate your prior sleep study so that we can order a new cpap machine for you.  If this is not available, will need to repeat your study. Work on [employer] loss Will arrange followup with me once we get your new machine.    ---  Spoke with pt. He reports he has not heard anything from our office wether or not his sleep study was received/if he was getting set up on CPAP. Please advise [place] thanks

## 2014-01-03 NOTE — Telephone Encounter (Signed)
Pt request to transfer from Dr. [name] to Dr. Jenny Reichmann. Pt stated that he need a hospital f/u for 3 weeks now and it is very difficult to get an appt with Dr. [name]. Please advise.

## 2014-01-03 NOTE — Telephone Encounter (Signed)
I have not heard anything.  Would check with ashtyn to see if she has received.

## 2014-01-03 NOTE — Telephone Encounter (Signed)
ok 

## 2014-01-03 NOTE — Telephone Encounter (Signed)
Very sorry, I am unable as Dr Linda Hedges as recently retired , we are still seeing his patients, and the new MD does not start until sept 2015

## 2014-01-03 NOTE — Telephone Encounter (Signed)
Pt is aware. Pt has an appt with Dr. [name] for hospital fu 01/09/14.

## 2014-01-03 NOTE — Telephone Encounter (Signed)
Please advise [name] thanks 

## 2014-01-07 ENCOUNTER — Ambulatory Visit (INDEPENDENT_AMBULATORY_CARE_PROVIDER_SITE_OTHER): Payer: Medicare PPO | Admitting: Surgery

## 2014-01-07 ENCOUNTER — Encounter (INDEPENDENT_AMBULATORY_CARE_PROVIDER_SITE_OTHER): Payer: Self-pay | Admitting: Surgery

## 2014-01-07 VITALS — BP 138/86 | HR 80 | Temp 97.5°F | Ht 72.0 in | Wt >= 6400 oz

## 2014-01-07 DIAGNOSIS — Z9889 Other specified postprocedural states: Principal | ICD-10-CM

## 2014-01-07 DIAGNOSIS — Z8719 Personal history of other diseases of the digestive system: Secondary | ICD-10-CM

## 2014-01-07 NOTE — Progress Notes (Signed)
General Surgery [facility]  Chief Complaint  Patient presents with  . Post-op Problem    ventral hernia repair 12/17/2013 by Dr. Serita Grammes    HISTORY: Patient is a 58 year old male who underwent repair of incarcerated ventral hernia with mesh on 12/17/2013. Patient has been seen in our practice since discharge for wound care and evaluation of his drains. Patient has a single remaining drain. He returns today for wound check. He also returns to request more narcotic pain medication.  EXAM: Midline abdominal incision is intact. Remaining Steri-Strips are removed. There are a few staples in place around the umbilicus but I'm going to leave those in place due to a small amount of serous drainage in that vicinity. Dressings are changed. There is a Jackson-Pratt drain in the left upper quadrant. The drainage bulb contains a large volume of serous fluid. This is emptied and the patient is again instructed in drain care. New dressings are applied.  Patient also has a superficial wound on the right posterior shoulder which he states was present when he left the hospital. This is likely a superficial decubitus ulcer due to his morbid obesity. It is not infected. There is a dry eschar.  IMPRESSION: #1 status post incarcerated ventral incisional hernia repair with mesh #2 superficial decubitus ulceration right posterior shoulder  PLAN: Patient is instructed in drain care. He is instructed in wound care. I told him that he may shower. I've told him to apply topical cocoa butter to the superficial decubitus on the right posterior shoulder.  Patient will return to see Dr. Donne Hazel in 10-14 days.  Earnstine [name], MD, [facility]   Visit Diagnoses: 1. S/P repair of ventral hernia

## 2014-01-07 NOTE — Patient Instructions (Signed)
  CARE OF INCISION   Apply cocoa butter/vitamin E cream (Palmer's brand) to your incision 2 - 3 times daily.  Massage cream into incision for one minute with each application.  Use sunscreen (50 SPF or higher) for first 6 months after surgery if area is exposed to sun.  You may alternate Mederma or other scar reducing cream with cocoa butter cream if desired.       [name] Stroh M. Meribeth Vitug, MD, FACS      [facility]      Office: [phone]    

## 2014-01-08 NOTE — Telephone Encounter (Signed)
Called for records [phone] detailed message to fax records, office closed at 530 Re-faxed request to ([phone])  Will call again in AM

## 2014-01-09 ENCOUNTER — Encounter: Payer: Self-pay | Admitting: Internal Medicine

## 2014-01-09 ENCOUNTER — Other Ambulatory Visit (INDEPENDENT_AMBULATORY_CARE_PROVIDER_SITE_OTHER): Payer: Commercial Managed Care - HMO

## 2014-01-09 ENCOUNTER — Ambulatory Visit (INDEPENDENT_AMBULATORY_CARE_PROVIDER_SITE_OTHER): Payer: Commercial Managed Care - HMO | Admitting: Internal Medicine

## 2014-01-09 VITALS — BP 148/70 | HR 67 | Temp 96.7°F | Resp 16 | Wt >= 6400 oz

## 2014-01-09 DIAGNOSIS — E109 Type 1 diabetes mellitus without complications: Secondary | ICD-10-CM

## 2014-01-09 DIAGNOSIS — E785 Hyperlipidemia, unspecified: Secondary | ICD-10-CM

## 2014-01-09 DIAGNOSIS — E039 Hypothyroidism, unspecified: Secondary | ICD-10-CM

## 2014-01-09 DIAGNOSIS — I10 Essential (primary) hypertension: Secondary | ICD-10-CM

## 2014-01-09 DIAGNOSIS — D510 Vitamin B12 deficiency anemia due to intrinsic factor deficiency: Secondary | ICD-10-CM

## 2014-01-09 DIAGNOSIS — M169 Osteoarthritis of hip, unspecified: Secondary | ICD-10-CM

## 2014-01-09 DIAGNOSIS — D509 Iron deficiency anemia, unspecified: Secondary | ICD-10-CM

## 2014-01-09 DIAGNOSIS — M1610 Unilateral primary osteoarthritis, unspecified hip: Secondary | ICD-10-CM

## 2014-01-09 LAB — CBC WITH DIFFERENTIAL/PLATELET
BASOS ABS: 0 10*3/uL (ref 0.0–0.1)
Basophils Relative: 0.4 % (ref 0.0–3.0)
EOS PCT: 2.9 % (ref 0.0–5.0)
Eosinophils Absolute: 0.3 10*3/uL (ref 0.0–0.7)
HCT: 30.7 % — ABNORMAL LOW (ref 39.0–52.0)
Lymphocytes Relative: 11.1 % — ABNORMAL LOW (ref 12.0–46.0)
Lymphs Abs: 1.1 10*3/uL (ref 0.7–4.0)
MCHC: 32.2 g/dL (ref 30.0–36.0)
MCV: 80.6 fl (ref 78.0–100.0)
MONO ABS: 0.5 10*3/uL (ref 0.1–1.0)
MONOS PCT: 5.4 % (ref 3.0–12.0)
Neutro Abs: 7.9 10*3/uL — ABNORMAL HIGH (ref 1.4–7.7)
Neutrophils Relative %: 80.2 % — ABNORMAL HIGH (ref 43.0–77.0)
PLATELETS: 325 10*3/uL (ref 150.0–400.0)
RBC: 3.81 Mil/uL — ABNORMAL LOW (ref 4.22–5.81)
RDW: 15.8 % — AB (ref 11.5–14.6)
WBC: 9.9 10*3/uL (ref 4.5–10.5)

## 2014-01-09 LAB — BASIC METABOLIC PANEL
BUN: 23 mg/dL (ref 6–23)
CHLORIDE: 100 meq/L (ref 96–112)
CO2: 30 mEq/L (ref 19–32)
Calcium: 9.7 mg/dL (ref 8.4–10.5)
Creatinine, Ser: 1.4 mg/dL (ref 0.4–1.5)
GFR: 66.29 mL/min (ref >60.00)
GLUCOSE: 145 mg/dL — AB (ref 70–99)
POTASSIUM: 5.3 meq/L — AB (ref 3.5–5.1)
SODIUM: 139 meq/L (ref 135–145)

## 2014-01-09 LAB — FOLATE: Folate: 15.4 ng/mL (ref >5.9)

## 2014-01-09 LAB — TSH: TSH: 1.07 u[IU]/mL (ref 0.35–5.50)

## 2014-01-09 LAB — FERRITIN: Ferritin: 155.5 ng/mL (ref 22.0–322.0)

## 2014-01-09 LAB — IBC PANEL
IRON: 12 ug/dL — AB (ref 42–165)
Saturation Ratios: 4.4 % — ABNORMAL LOW (ref 20.0–50.0)
TRANSFERRIN: 196.6 mg/dL — AB (ref 212.0–360.0)

## 2014-01-09 LAB — VITAMIN B12: VITAMIN B 12: 249 pg/mL (ref 211–911)

## 2014-01-09 LAB — HEMOGLOBIN A1C: HEMOGLOBIN A1C: 8.9 % — AB (ref 4.6–6.5)

## 2014-01-09 MED ORDER — FERROUS SULFATE 325 (65 FE) MG PO TABS: 325.0000 mg | ORAL_TABLET | Freq: Two times a day (BID) | ORAL | Status: DC

## 2014-01-09 MED ORDER — SIMVASTATIN 40 MG PO TABS: 40.0000 mg | ORAL_TABLET | Freq: Every day | ORAL | Status: DC

## 2014-01-09 MED ORDER — INSULIN DETEMIR 100 UNIT/ML ~~LOC~~ SOLN: 60.0000 [IU] | Freq: Two times a day (BID) | SUBCUTANEOUS | Status: DC

## 2014-01-09 MED ORDER — HYDROCODONE-ACETAMINOPHEN 10-325 MG PO TABS: 1.0000 | ORAL_TABLET | ORAL | Status: DC | PRN

## 2014-01-09 NOTE — Progress Notes (Signed)
Pre visit review using our clinic review tool, if applicable. No additional management support is needed unless otherwise documented below in the visit note. 

## 2014-01-09 NOTE — Patient Instructions (Signed)
Type 1 Diabetes Mellitus, Adult Type 1 diabetes mellitus, often simply referred to as diabetes, is a long-term (chronic) disease. It occurs when the islet cells in the pancreas that make insulin (a hormone) are destroyed and can no longer make insulin. Insulin is needed to move sugars from food into the tissue cells. The tissue cells use the sugars for energy. In people with type 1 diabetes, the sugars build up in the blood instead of going into the tissue cells. As a result, high blood sugar (hyperglycemia) develops. Without insulin, the body breaks down fat cells for the needed energy. This breakdown of fat cells produces acid chemicals (ketones), which increases the acid levels in the body. The effect of either high ketone or sugar (glucose) levels can be life-threatening.  Type 1 diabetes was also previously called juvenile diabetes. It most often occurs before the age of 82, but it can occur at any age. RISK FACTORS A person is predisposed to developing type 1 diabetes if someone in his or her family has the disease and is exposed to certain additional environmental triggers.  SYMPTOMS  Symptoms of type 1 diabetes may develop gradually over days to weeks or suddenly. The symptoms occur due to hyperglycemia. The symptoms can include:   Increased thirst (polydipsia).  Increased urination (polyuria).  Increased urination during the night (nocturia).  Weight loss. This weight loss may be rapid.  Frequent, recurring infections.  Tiredness (fatigue).  Weakness.  Vision changes, such as blurred vision.  Fruity smell to your breath.  Abdominal pain.  Nausea or vomiting. DIAGNOSIS  Type 1 diabetes is diagnosed when symptoms of diabetes are present and when blood glucose levels are increased. Your blood glucose level may be checked by one or more of the following blood tests:  A fasting blood glucose test. You will not be allowed to eat for at least 8 hours before a blood sample is  taken.  A random blood glucose test. Your blood glucose is checked at any time of the day regardless of when you ate.  A hemoglobin A1c blood glucose test. A hemoglobin A1c test provides information about blood glucose control over the previous 3 months. TREATMENT  Although type 1 diabetes cannot be prevented, it can be managed with insulin, diet, and exercise.  You will need to take insulin daily to keep blood glucose in the desired range.  You will need to match insulin dosing with exercise and healthy food choices. The treatment goal is to maintain the before-meal blood sugar (preprandial glucose) level at 70 130 mg/dL.  HOME CARE INSTRUCTIONS   Have your hemoglobin A1c level checked twice a year.  Perform daily blood glucose monitoring as directed by your caregiver.  Monitor urine ketones when you are ill and as directed by your caregiver.  Take your insulin as directed by your caregiver to maintain your blood glucose level in the desired range.  Never run out of insulin. It is needed every day.  Adjust insulin based on your intake of carbohydrates. Carbohydrates can raise blood glucose levels but need to be included in your diet. Carbohydrates provide vitamins, minerals, and fiber, which are an essential part of a healthy diet. Carbohydrates are found in fruits, vegetables, whole grains, dairy products, legumes, and foods containing added sugars.    Eat healthy foods. Alternate 3 meals with 3 snacks.  Maintain a healthy weight.  Carry a medical alert card or wear your medical alert jewelry.  Carry a 15 gram carbohydrate snack with you  at all times to treat low blood glucose (hypoglycemia). Some examples of 15 gram carbohydrate snacks include:  Glucose tablets, 3 or 4.   Glucose gel, 15 gram tube.  Raisins, 2 tablespoons (24 grams).  Jelly beans, 6.  Animal crackers, 8.  Fruit juice, regular soda, or low-fat milk, 4 ounces (120 mL).  Gummy treats,  9.    Recognize hypoglycemia. Hypoglycemia occurs with blood glucose levels of 70 mg/dL and below. The risk for hypoglycemia increases when fasting or skipping meals, during or after intense exercise, and during sleep. Hypoglycemia symptoms can include:  Tremors or shakes.  Decreased ability to concentrate.  Sweating.  Increased heart rate.  Headache.  Dry mouth.  Hunger.  Irritability.  Anxiety.  Restless sleep.  Altered speech or coordination.  Confusion.  Treat hypoglycemia promptly. If you are alert and able to safely swallow, follow the 15:15 rule:  Take 15 20 grams of rapid-acting glucose or carbohydrate. Rapid-acting options include glucose gel, glucose tablets, or 4 ounces (120 mL) of fruit juice, regular soda, or low-fat milk.  Check your blood glucose level 15 minutes after taking the glucose.   Take 15 20 grams more of glucose if the repeat blood glucose level is still 70 mg/dL or below.  Eat a meal or snack within 1 hour once blood glucose levels return to normal.  Be alert to polyuria and polydipsia, which are early signs of hyperglycemia. An early awareness of hyperglycemia allows for prompt treatment. Treat hyperglycemia as directed by your caregiver.  Engage in at least 150 minutes of moderate-intensity physical activity a week, spread over at least 3 days of the week or as directed by your caregiver.  Adjust your insulin dosing and food intake as needed if you start a new exercise or sport.  Follow your sick day plan at any time you are unable to eat or drink as usual.   Avoid tobacco use.  Limit alcohol intake to no more than 1 drink per day for nonpregnant women and 2 drinks per day for men. You should drink alcohol only when you are also eating food. Talk with your caregiver about whether alcohol is safe for you. Tell your caregiver if you drink alcohol several times a week.  Follow up with your caregiver regularly.  Schedule an eye exam  within 5 years of diagnosis and then annually.  Perform daily skin and foot care. Examine your skin and feet daily for cuts, bruises, redness, nail problems, bleeding, blisters, or sores. A foot exam by a caregiver should be done annually.  Brush your teeth and gums at least twice a day and floss at least once a day. Follow up with your dentist regularly.  Share your diabetes management plan with your workplace or school.  Stay up-to-date with immunizations.  Learn to manage stress.  Obtain ongoing diabetes education and support as needed.  Participate or seek rehabilitation as needed to maintain or improve independence and quality of life. Request a physical or occupational therapy referral if you are having foot or hand numbness or difficulties with grooming, dressing, eating, or physical activity. SEEK MEDICAL CARE IF:   You are unable to eat food or drink fluids for more than 6 hours.  You have nausea and vomiting for more than 6 hours.  Your blood glucose level is over 240 mg/dL.  There is a change in mental status.  You develop an additional serious illness.  You have diarrhea for more than 6 hours.  You have been  sick or have had a fever for a couple of days and are not getting better.  You have pain during any physical activity. SEEK IMMEDIATE MEDICAL CARE IF:  You have difficulty breathing.  You have moderate to large ketone levels. MAKE SURE YOU:  Understand these instructions.  Will watch your condition.  Will get help right away if you are not doing well or get worse. Document Released: 09/02/2000 Document Revised: 05/30/2012 Document Reviewed: 04/03/2012 [facility] Patient Information 2014 [place].

## 2014-01-09 NOTE — Progress Notes (Signed)
Subjective:    Patient ID: [name], male    DOB: 05-Aug-1956, 58 y.o.   MRN: [id number]  Thyroid Problem Presents for follow-up visit. Symptoms include cold intolerance, dry skin and fatigue. Patient reports no anxiety, constipation, depressed mood, diaphoresis, diarrhea, hair loss, heat intolerance, hoarse voice, leg swelling, nail problem, palpitations, tremors, visual change, weight gain or weight loss. The symptoms have been worsening. Past treatments include levothyroxine. The treatment provided mild relief. His past medical history is significant for neuropathy and obesity.      Review of Systems  Constitutional: Positive for fatigue. Negative for fever, chills, weight loss, weight gain, diaphoresis, activity change, appetite change and unexpected weight change.  HENT: Negative.  Negative for hoarse voice.   Eyes: Negative.   Respiratory: Negative.  Negative for cough, choking, shortness of breath, wheezing and stridor.   Cardiovascular: Negative for chest pain, palpitations and leg swelling.  Gastrointestinal: Negative.  Negative for nausea, vomiting, abdominal pain, diarrhea, constipation, blood in stool and anal bleeding.  Endocrine: Positive for cold intolerance. Negative for heat intolerance, polydipsia, polyphagia and polyuria.  Genitourinary: Negative.   Musculoskeletal: Positive for arthralgias (hips and knees) and gait problem (he uses a walker). Negative for back pain, joint swelling, myalgias, neck pain and neck stiffness.  Skin: Negative.   Allergic/Immunologic: Negative.   Neurological: Negative for dizziness, tremors, weakness, light-headedness and numbness.  Hematological: Negative.  Negative for adenopathy. Does not bruise/bleed easily.  Psychiatric/Behavioral: Negative.        Objective:   Physical Exam  Vitals reviewed. Constitutional: He is oriented to person, place, and time.  Non-toxic appearance. He has a sickly appearance. He does not appear ill. No  distress.  Foul smelling Poorly kempt  HENT:  Mouth/Throat: Oropharynx is clear and moist. No oropharyngeal exudate.  Eyes: Conjunctivae are normal. Right eye exhibits no discharge. Left eye exhibits no discharge. No scleral icterus.  Neck: Normal range of motion. Neck supple. No JVD present. No tracheal deviation present. No thyromegaly present.  Cardiovascular: Normal rate, regular rhythm, normal heart sounds and intact distal pulses.  Exam reveals no gallop and no friction rub.   No murmur heard. Pulmonary/Chest: Effort normal and breath sounds normal. No stridor. No respiratory distress. He has no wheezes. He has no rales. He exhibits no tenderness.  Abdominal: Soft. Bowel sounds are normal. He exhibits no distension and no mass. There is no tenderness. There is no rebound and no guarding.  Musculoskeletal: Normal range of motion. He exhibits edema. He exhibits no tenderness.  There is trace edema in BLE with brawny induration but there are no ulcers or lesions.  Lymphadenopathy:    He has no cervical adenopathy.  Neurological: He is oriented to person, place, and time.  Skin: Skin is warm and dry. No rash noted. No erythema. No pallor.  Psychiatric: He has a normal mood and affect. His behavior is normal. Judgment and thought content normal.     Lab Results  Component Value Date   WBC 7.3 12/20/2013   HGB 9.3* 12/20/2013   HCT 29.5* 12/20/2013   PLT 202 12/20/2013   GLUCOSE 234* 12/20/2013   CHOL 159 09/02/2013   TRIG 59.0 09/02/2013   HDL 38.40* 09/02/2013   LDLDIRECT 136.8 12/12/2006   LDLCALC 109* 09/02/2013   ALT 16 12/17/2013   AST 18 12/17/2013   NA 141 12/20/2013   K 4.9 12/20/2013   CL 105 12/20/2013   CREATININE 1.52* 12/20/2013   BUN 20 12/20/2013   CO2  25 12/20/2013   TSH 1.33 05/07/2013   PSA 0.71 01/11/2013   HGBA1C 8.5* 09/02/2013   MICROALBUR 4.7* 05/07/2013       Assessment & Plan:

## 2014-01-10 NOTE — Assessment & Plan Note (Signed)
He will cont norco as needed for pain 

## 2014-01-10 NOTE — Assessment & Plan Note (Signed)
His iron level is low so I have asked him to start taking feosol

## 2014-01-10 NOTE — Assessment & Plan Note (Signed)
His blood sugars are not well controlled He agrees to be more compliant with his insulin regimen I have asked him to attend diabetic education

## 2014-01-10 NOTE — Assessment & Plan Note (Signed)
His BP is well controlled 

## 2014-01-10 NOTE — Telephone Encounter (Signed)
Called for records [phone])-- spoke with rep--states that initial fax # given keeps failing.  I have given a different fax number. These records are to be faxed to triage [phone]. Will give to [facility] to review as soon as they come through so that order can be placed for patient's CPAP ASAP.

## 2014-01-10 NOTE — Telephone Encounter (Signed)
Records received--Sleep study from [facility]--these have been placed in your [name] folder for review.  (see message below)

## 2014-01-10 NOTE — Assessment & Plan Note (Signed)
Start feosol

## 2014-01-13 ENCOUNTER — Other Ambulatory Visit: Payer: Self-pay | Admitting: Pulmonary Disease

## 2014-01-13 ENCOUNTER — Encounter: Payer: Self-pay | Admitting: Pulmonary Disease

## 2014-01-13 DIAGNOSIS — G4733 Obstructive sleep apnea (adult) (pediatric): Secondary | ICD-10-CM

## 2014-01-14 ENCOUNTER — Telehealth (INDEPENDENT_AMBULATORY_CARE_PROVIDER_SITE_OTHER): Payer: Self-pay

## 2014-01-14 ENCOUNTER — Telehealth: Payer: Self-pay | Admitting: Internal Medicine

## 2014-01-14 NOTE — Telephone Encounter (Signed)
Pt is s/p incarcerated incisional hernia repair on 12/18/13.  Pt still has a drain and it is draining about 10cc's daily.  He has been taking Iron x 1 wk and is now having dark stools and stomach pain.  Having some chills, but no thermometer so he does not know if he has a fever.  Please advise.

## 2014-01-14 NOTE — Telephone Encounter (Signed)
Patient Information:  Caller Name: Romaldo  Phone: [phone]  Patient: [name], [name]  Gender: Male  DOB: 1955-10-31  Age: 58 Years  PCP: Scarlette Calico (Adults only)  Office Follow Up:  Does the office need to follow up with this patient?: No  Instructions For The Office: N/A  RN Note:  Patient calling with c/o abdominal pain and black tarry stools.  S/P Hernia repair on 12/17/13.  States he is currently taking iron pills as well.  Patient states he is going to call the surgeon first to see if they can see him today since "He feels like the tube they placed in his stomach is pinching him."  Agrees to call back if he cannot be seen by them today.  Symptoms  Reason For Call & Symptoms: black stools  Reviewed Health History In EMR: Yes  Reviewed Medications In EMR: Yes  Reviewed Allergies In EMR: Yes  Reviewed Surgeries / Procedures: Yes  Date of Onset of Symptoms: 01/13/2014  Guideline(s) Used:  Rectal Symptoms  Abdominal Pain - Male  Disposition Per Guideline:   Go to ED Now  Reason For Disposition Reached:   Bloody, black, or tarry bowel movements  Advice Given:  Call Back If:  You become worse.  Patient Refused Recommendation:  Patient Will Follow Up With Office Later  Patient will follow up with Surgeon

## 2014-01-15 ENCOUNTER — Encounter (INDEPENDENT_AMBULATORY_CARE_PROVIDER_SITE_OTHER): Payer: Medicare PPO | Admitting: Surgery

## 2014-01-15 NOTE — Telephone Encounter (Signed)
Hard to tell what any of this means.  Please call him today and check.  If he still doesn't feel well may need to come to [facility]

## 2014-01-15 NOTE — Telephone Encounter (Signed)
Study has been reviewed by [facility] and placed in scan folder. [place] Bing, CMA

## 2014-01-15 NOTE — Telephone Encounter (Signed)
LMOM asking for pt to call me back. I want to let him know that I moved his appt to Dr Donne Hazel for tomorrow pm instead of seeing Dr Brantley Stage. I was told by triage the pt couldn't come to urgent office today b/c he has to arrange the [employer] bus to pick him up for appt's.

## 2014-01-15 NOTE — Telephone Encounter (Signed)
[name] returned call. The [name] is aware that he will be seeing Dr Donne Hazel tomorrow in office.

## 2014-01-15 NOTE — Telephone Encounter (Signed)
Patient returning phone call back. Pt states that is not feeling any better and he actually feels worse today. Notified pt to be here today at 4:15 for a 4:30 to see Dr Ninfa Linden in the urgent office.

## 2014-01-15 NOTE — Telephone Encounter (Signed)
LMOV to call if not feeling better today.  See Dr. Cristal Generous note.

## 2014-01-16 ENCOUNTER — Ambulatory Visit (INDEPENDENT_AMBULATORY_CARE_PROVIDER_SITE_OTHER): Payer: Medicare PPO | Admitting: General Surgery

## 2014-01-16 ENCOUNTER — Encounter (INDEPENDENT_AMBULATORY_CARE_PROVIDER_SITE_OTHER): Payer: Self-pay | Admitting: General Surgery

## 2014-01-16 ENCOUNTER — Encounter (INDEPENDENT_AMBULATORY_CARE_PROVIDER_SITE_OTHER): Payer: Medicare PPO | Admitting: Surgery

## 2014-01-16 VITALS — BP 133/78 | HR 88 | Temp 98.6°F | Resp 18 | Ht 72.0 in | Wt >= 6400 oz

## 2014-01-16 DIAGNOSIS — Z09 Encounter for follow-up examination after completed treatment for conditions other than malignant neoplasm: Secondary | ICD-10-CM

## 2014-01-16 NOTE — Progress Notes (Signed)
Subjective:     Patient ID: [name], male   DOB: 1955-10-26, 58 y.o.   MRN: [id number]  HPI 58 yom who is s/p repair of incarcerated ventral hernia with an sbo.  He had preperitoneal mesh placed.  He states his abdomen is sore at times. He is having bms although they are dark now due to recent iron.  He has no nausea.  No fevers, no drainage from wound.  His drain is putting out much less now.  He is just concerned he is not improving quicker  Review of Systems     Objective:   Physical Exam Drain with serous fluid, I removed today I removed remainder of staples also, incision clean without infection or drainage today Abdomen is really soft and nontender    Assessment:     S/p open vh repair for incarcerated hernia      Plan:     I think he has normal postop course. WBC was nl at last visit. Afebrile, no other signs/symptoms that make me think we need to get ct or start abx today.  I removed his drain and remaining staples. I will see him back in one week.  If he has other symptoms he will return sooner.

## 2014-01-17 ENCOUNTER — Telehealth: Payer: Self-pay | Admitting: Internal Medicine

## 2014-01-17 NOTE — Telephone Encounter (Signed)
Referral for Dr Rolm Bookbinder faxed  to [employer] @ [phone]

## 2014-01-20 ENCOUNTER — Telehealth (INDEPENDENT_AMBULATORY_CARE_PROVIDER_SITE_OTHER): Payer: Self-pay

## 2014-01-20 NOTE — Telephone Encounter (Signed)
Pt returned my call. I advised pt that Dr Donne Hazel wants you seen today in the urgent office but pt declined appt b/c no transportation. I made him an appt with Dr Donne Hazel for 01/21/14. The pt understands.

## 2014-01-20 NOTE — Telephone Encounter (Signed)
He should probably come to the urg

## 2014-01-20 NOTE — Telephone Encounter (Signed)
LMOM for pt to call me. I tried calling his home but n/a so left message on cell.

## 2014-01-20 NOTE — Telephone Encounter (Signed)
Pt calling b/c ever since he left the office he has been having a lot of drainage at the belly incision. The pt stated that he is changing the the bandages 5-6 times a day with pinkish/red drainage. The pt has no odor,no fever,and no chills. The pt is scheduled for f/u this Friday but wanted you to know how much drainage is going on now. Please advise.

## 2014-01-21 ENCOUNTER — Telehealth: Payer: Self-pay | Admitting: Pulmonary Disease

## 2014-01-21 ENCOUNTER — Ambulatory Visit (INDEPENDENT_AMBULATORY_CARE_PROVIDER_SITE_OTHER): Payer: Medicare PPO | Admitting: General Surgery

## 2014-01-21 ENCOUNTER — Encounter (INDEPENDENT_AMBULATORY_CARE_PROVIDER_SITE_OTHER): Payer: Self-pay | Admitting: General Surgery

## 2014-01-21 VITALS — BP 130/82 | HR 80 | Temp 98.4°F | Resp 16 | Ht 72.0 in | Wt 383.0 lb

## 2014-01-21 DIAGNOSIS — Z09 Encounter for follow-up examination after completed treatment for conditions other than malignant neoplasm: Secondary | ICD-10-CM

## 2014-01-21 NOTE — Telephone Encounter (Signed)
Spoke with pt-- has not heard from DME and has not received any supplies. Explained to the pt that i will contact DME and straighten everything out with his CPAP and call him back once done.  Pt also aware that once he receives CPAP machine, he needs to schedule appt with [facility] for 8 weeks. Expressed understanding. ---- Spoke with Arbie Cookey at [facility].  They have been unable to reach pt at home # [phone] Order to be processed today and pt to be contacted by [facility]. ---- I have left a detailed message about CPAP machine order being processed by [facility]--await their call. Pt aware via vmail that he needs to be scheduled for 8 week follow up with [facility].

## 2014-01-21 NOTE — Progress Notes (Signed)
Subjective:     Patient ID: [name], male   DOB: 04-23-1956, 58 y.o.   MRN: [id number]  HPI This is a 58 year old male who I saw in the hospital with an incarcerated ventral hernia associated with a bowel obstruction. I took in the operating room and lysed adhesions as well as I did a preperitoneal repair of his ventral hernia with mesh. I saw him last week and I removed the remainder of his staples as well as took out his last remaining drain. He returns today with some drainage right around his umbilicus. He's had no fever. He is he well. He has no nausea vomiting is having bowel movements.  Review of Systems     Objective:   Physical Exam Abdomen is morbidly obese, there is no evidence of any infection or any seroma that I can tell although this is difficult given his body habitus. I removed one more remaining staple.    Assessment:     Status post ventral hernia repair     Plan:     I cant really identify where the fluid is coming from although there is a small opening that appears superficial around his umbilicus. It would be reasonable to this point is to follow this and do local wound care. I encouraged him to keep this area clean. I will follow him back in several weeks.

## 2014-01-21 NOTE — Telephone Encounter (Signed)
Spoke with Arbie Cookey. She states they got a hold of the pt about his CPAP machine. There is a copay that the pt will have to pay in order to receive the machine. Pt is not wanting to pay it. Huey Romans will try to work with the patient to see if wants the machine at a later date.

## 2014-01-23 ENCOUNTER — Encounter (INDEPENDENT_AMBULATORY_CARE_PROVIDER_SITE_OTHER): Payer: Medicare PPO | Admitting: General Surgery

## 2014-01-24 ENCOUNTER — Encounter (INDEPENDENT_AMBULATORY_CARE_PROVIDER_SITE_OTHER): Payer: Medicare PPO | Admitting: General Surgery

## 2014-01-24 ENCOUNTER — Telehealth (INDEPENDENT_AMBULATORY_CARE_PROVIDER_SITE_OTHER): Payer: Self-pay

## 2014-01-24 NOTE — Telephone Encounter (Addendum)
Patient called to inform us he saw his doctor today that had told him he should had a nurse to come to his house upon discharge to bring him supplies for dressing changes, "I do not have the money to pay for all these supplies, and this was yawls responsibility make sure I did not have to worry with it" I ask the name of the doctor he saw  today, he refused to tell me . I ask who was changing his dressing , he stated himself and he did not need help to do so . I advised if he did his own dressing changes , home health would not supply the dressing without a nurse needing to assist or perform  dressing changes. " I do NOT NEED A NURSE for dressing changes I CAN DO THEM MYSELF, and my incision incontiunes to leak and if I get an infection I will up there to see [name]" I ask if he had a temp? He stated YES. I ask if I could make him an appointment  to be seen today ?Patient abruptly dismissed the call . Please advise

## 2014-01-25 NOTE — Telephone Encounter (Signed)
I saw him this week. He did not have infection. He had some drainage in area where his skin contacted itself and dressing changes would be fine. If he has a temp or other issues he will need to come to urgent office or er if worsesn

## 2014-01-27 ENCOUNTER — Telehealth: Payer: Self-pay | Admitting: Internal Medicine

## 2014-01-27 NOTE — Telephone Encounter (Signed)
F/U call; Patient states he continues to have a temp, he does not recall the reading , offered an appointment today to be seen. Patient refused .  He verbalized dissatisfaction with his care. I offered home health dressing changes patient again refused .  I informed him I would make sure DR. Hassell Done was aware. Patient states he will see Dr. Hassell Done on the 15th and he would talk to him then about the care he has received . I apologized and advised him to call if he need a sooner appointment.

## 2014-01-27 NOTE — Telephone Encounter (Signed)
Pre visit review using our clinic review tool, if applicable. No additional management support is needed unless otherwise documented below in the visit note. 

## 2014-01-27 NOTE — Telephone Encounter (Signed)
rx refill for Polyethylene Glycol. Call into [facility]

## 2014-01-29 ENCOUNTER — Telehealth (INDEPENDENT_AMBULATORY_CARE_PROVIDER_SITE_OTHER): Payer: Self-pay

## 2014-01-29 NOTE — Telephone Encounter (Signed)
Pt called in stating he is still having clear drainage and part of incision is still open at umbilical area. Recent office notes states this is ongoing post operative issue. Was told to continue wound care. He does not want to see any other MD here. Advised him to keep scheduled appt for Monday with Dr Donne Hazel. Told him he needs to call for sooner appt if he becomes feverish, redness around wound site, swelling, pus drainage or if he notices a foul smell from the area. Pt understanding.

## 2014-01-30 ENCOUNTER — Ambulatory Visit: Payer: Commercial Managed Care - HMO | Admitting: Internal Medicine

## 2014-02-03 ENCOUNTER — Ambulatory Visit (INDEPENDENT_AMBULATORY_CARE_PROVIDER_SITE_OTHER): Payer: Medicare PPO | Admitting: General Surgery

## 2014-02-03 ENCOUNTER — Other Ambulatory Visit: Payer: Self-pay

## 2014-02-03 ENCOUNTER — Encounter (INDEPENDENT_AMBULATORY_CARE_PROVIDER_SITE_OTHER): Payer: Self-pay | Admitting: General Surgery

## 2014-02-03 ENCOUNTER — Other Ambulatory Visit (INDEPENDENT_AMBULATORY_CARE_PROVIDER_SITE_OTHER): Payer: Self-pay | Admitting: General Surgery

## 2014-02-03 VITALS — BP 134/76 | HR 64 | Temp 98.5°F | Wt >= 6400 oz

## 2014-02-03 DIAGNOSIS — Z09 Encounter for follow-up examination after completed treatment for conditions other than malignant neoplasm: Secondary | ICD-10-CM

## 2014-02-03 DIAGNOSIS — T148XXA Other injury of unspecified body region, initial encounter: Secondary | ICD-10-CM

## 2014-02-03 MED ORDER — LEVOTHYROXINE SODIUM 50 MCG PO TABS: 50.0000 ug | ORAL_TABLET | Freq: Every day | ORAL | Status: DC

## 2014-02-03 MED ORDER — TRAMADOL HCL 50 MG PO TABS: 50.0000 mg | ORAL_TABLET | Freq: Four times a day (QID) | ORAL | Status: DC | PRN

## 2014-02-03 NOTE — Progress Notes (Signed)
Subjective:     Patient ID: [name], male   DOB: 1956-01-20, 58 y.o.   MRN: [id number]  HPI 64 yom who underwent urgent ventral hernia repair with mesh preperitoneal for sbo. She has   Review of Systems     Objective:   Physical Exam Healing incision without infection, there still some umbilical drainage, this is from the upper fold at the umbilicus and is a 4 mm hole that is draining, I probed this and there is small cavity, no real infection    Assessment:     S/p vh repair     Plan:     Do not think this is infected. I do not think he needs antibiotics. I did pack this morning and home health involved to do dressing changes once daily. Hopefully this does heal on its own. This is a difficult place to his morbid obesity. I will have him come back to see me in about a week or so.

## 2014-02-05 ENCOUNTER — Ambulatory Visit (INDEPENDENT_AMBULATORY_CARE_PROVIDER_SITE_OTHER): Payer: Commercial Managed Care - HMO | Admitting: Internal Medicine

## 2014-02-05 ENCOUNTER — Encounter: Payer: Self-pay | Admitting: Internal Medicine

## 2014-02-05 ENCOUNTER — Other Ambulatory Visit: Payer: Commercial Managed Care - HMO

## 2014-02-05 VITALS — BP 120/74 | HR 79 | Temp 98.2°F | Resp 16 | Ht 72.0 in | Wt >= 6400 oz

## 2014-02-05 DIAGNOSIS — M545 Low back pain, unspecified: Secondary | ICD-10-CM | POA: Insufficient documentation

## 2014-02-05 DIAGNOSIS — E1169 Type 2 diabetes mellitus with other specified complication: Secondary | ICD-10-CM

## 2014-02-05 DIAGNOSIS — L97509 Non-pressure chronic ulcer of other part of unspecified foot with unspecified severity: Secondary | ICD-10-CM

## 2014-02-05 DIAGNOSIS — E11621 Type 2 diabetes mellitus with foot ulcer: Secondary | ICD-10-CM

## 2014-02-05 NOTE — Patient Instructions (Signed)
Diabetes and Foot Care Diabetes may cause you to have problems because of poor blood supply (circulation) to your feet and legs. This may cause the skin on your feet to become thinner, break easier, and heal more slowly. Your skin may become dry, and the skin may peel and crack. You may also have nerve damage in your legs and feet causing decreased feeling in them. You may not notice minor injuries to your feet that could lead to infections or more serious problems. Taking care of your feet is one of the most important things you can do for yourself.  HOME CARE INSTRUCTIONS  Wear shoes at all times, even in the house. Do not go barefoot. Bare feet are easily injured.  Check your feet daily for blisters, cuts, and redness. If you cannot see the bottom of your feet, use a mirror or ask someone for help.  Wash your feet with warm water (do not use hot water) and mild soap. Then pat your feet and the areas between your toes until they are completely dry. Do not soak your feet as this can dry your skin.  Apply a moisturizing lotion or petroleum jelly (that does not contain alcohol and is unscented) to the skin on your feet and to dry, brittle toenails. Do not apply lotion between your toes.  Trim your toenails straight across. Do not dig under them or around the cuticle. File the edges of your nails with an emery board or nail file.  Do not cut corns or calluses or try to remove them with medicine.  Wear clean socks or stockings every day. Make sure they are not too tight. Do not wear knee-high stockings since they may decrease blood flow to your legs.  Wear shoes that fit properly and have enough cushioning. To break in new shoes, wear them for just a few hours a day. This prevents you from injuring your feet. Always look in your shoes before you put them on to be sure there are no objects inside.  Do not cross your legs. This may decrease the blood flow to your feet.  If you find a minor scrape,  cut, or break in the skin on your feet, keep it and the skin around it clean and dry. These areas may be cleansed with mild soap and water. Do not cleanse the area with peroxide, alcohol, or iodine.  When you remove an adhesive bandage, be sure not to damage the skin around it.  If you have a wound, look at it several times a day to make sure it is healing.  Do not use heating pads or hot water bottles. They may burn your skin. If you have lost feeling in your feet or legs, you may not know it is happening until it is too late.  Make sure your health care provider performs a complete foot exam at least annually or more often if you have foot problems. Report any cuts, sores, or bruises to your health care provider immediately. SEEK MEDICAL CARE IF:   You have an injury that is not healing.  You have cuts or breaks in the skin.  You have an ingrown nail.  You notice redness on your legs or feet.  You feel burning or tingling in your legs or feet.  You have pain or cramps in your legs and feet.  Your legs or feet are numb.  Your feet always feel cold. SEEK IMMEDIATE MEDICAL CARE IF:   There is increasing redness,   swelling, or pain in or around a wound.  There is a red line that goes up your leg.  Pus is coming from a wound.  You develop a fever or as directed by your health care provider.  You notice a bad smell coming from an ulcer or wound. Document Released: 09/02/2000 Document Revised: 05/08/2013 Document Reviewed: 02/12/2013 [employer] Patient Information 2014 [employer].  

## 2014-02-05 NOTE — Progress Notes (Signed)
Pre visit review using our clinic review tool, if applicable. No additional management support is needed unless otherwise documented below in the visit note. 

## 2014-02-05 NOTE — Progress Notes (Signed)
Subjective:    Patient ID: [name], male    DOB: 20-Sep-1955, 58 y.o.   MRN: [id number]  HPI Comments: He tells me that he stepped on a nail in his left foot one week ago and now has a large ulcer on the bottom of his left foot, there has not been any pain, drainage, bleeding, discharge, or concern for a FB.  Back Pain This is a new problem. The current episode started 1 to 4 weeks ago. The problem occurs intermittently. The problem is unchanged. The pain is present in the lumbar spine. The quality of the pain is described as aching. The pain radiates to the right thigh. The pain is at a severity of 4/10. The pain is worse during the day. The symptoms are aggravated by bending and position. Stiffness is present all day. Associated symptoms include numbness (both feet are numb). Pertinent negatives include no abdominal pain, bladder incontinence, bowel incontinence, chest pain, dysuria, fever, headaches, leg pain, paresis, paresthesias, pelvic pain, perianal numbness, tingling, weakness or weight loss. Risk factors include recent trauma, obesity and lack of exercise (he fell a few weeks ago and now has back pain). He has tried analgesics for the symptoms. The treatment provided moderate relief.      Review of Systems  Constitutional: Negative for fever, chills, weight loss, diaphoresis, appetite change and fatigue.  HENT: Negative.   Eyes: Negative.   Respiratory: Negative.  Negative for cough, choking, chest tightness, shortness of breath, wheezing and stridor.   Cardiovascular: Negative.  Negative for chest pain, palpitations and leg swelling.  Gastrointestinal: Negative.  Negative for nausea, vomiting, abdominal pain, diarrhea, constipation, blood in stool and bowel incontinence.  Endocrine: Negative.   Genitourinary: Negative.  Negative for bladder incontinence, dysuria and pelvic pain.  Musculoskeletal: Positive for back pain. Negative for arthralgias, gait problem, joint swelling,  myalgias, neck pain and neck stiffness.  Skin: Negative.   Allergic/Immunologic: Negative.   Neurological: Positive for numbness (both feet are numb). Negative for dizziness, tingling, weakness, headaches and paresthesias.  Hematological: Negative.  Negative for adenopathy. Does not bruise/bleed easily.  Psychiatric/Behavioral: Negative.        Objective:   Physical Exam  Vitals reviewed. Constitutional: He is oriented to person, place, and time. He appears well-developed and well-nourished.  Non-toxic appearance. He does not have a sickly appearance. He does not appear ill. No distress.  HENT:  Head: Normocephalic and atraumatic.  Mouth/Throat: Oropharynx is clear and moist. No oropharyngeal exudate.  Eyes: Conjunctivae are normal. Right eye exhibits no discharge. Left eye exhibits no discharge. No scleral icterus.  Neck: Normal range of motion. Neck supple. No JVD present. No tracheal deviation present. No thyromegaly present.  Cardiovascular: Normal rate, regular rhythm, normal heart sounds and intact distal pulses.  Exam reveals no gallop and no friction rub.   No murmur heard. Pulses:      Carotid pulses are 1+ on the right side, and 1+ on the left side.      Radial pulses are 1+ on the right side, and 1+ on the left side.       Femoral pulses are 1+ on the right side, and 1+ on the left side.      Popliteal pulses are 1+ on the right side, and 1+ on the left side.       Dorsalis pedis pulses are 1+ on the right side, and 1+ on the left side.       Posterior tibial pulses are 0  on the right side, and 0 on the left side.  Pulmonary/Chest: Effort normal and breath sounds normal. No stridor. No respiratory distress. He has no wheezes. He has no rales. He exhibits no tenderness.  Abdominal: Soft. Bowel sounds are normal. He exhibits no distension and no mass. There is no tenderness. There is no rebound and no guarding.  Musculoskeletal: Normal range of motion. He exhibits edema (1+  edema in BLE). He exhibits no tenderness.       Lumbar back: Normal. He exhibits normal range of motion, no tenderness, no bony tenderness, no swelling, no edema, no deformity, no laceration, no pain, no spasm and normal pulse.       Left foot: He exhibits deformity. He exhibits normal range of motion, no tenderness, no bony tenderness, no swelling, normal capillary refill, no crepitus and no laceration.  Left foot plantar side over the medial arch there is a 2 cm deep ulcer filled with granulation tissue and surrounded by layers of thick callous formation. There is no FB, exudate, foul odor, tenderness, erythema, or warmth.  Lymphadenopathy:    He has no cervical adenopathy.  Neurological: He is oriented to person, place, and time.  Skin: Skin is warm and dry. No rash noted. He is not diaphoretic. No erythema. No pallor.  Psychiatric: He has a normal mood and affect. His behavior is normal. Judgment and thought content normal.    Lab Results  Component Value Date   WBC 9.9 01/09/2014   HGB 9.9 Repeated and verified X2.* 01/09/2014   HCT 30.7* 01/09/2014   PLT 325.0 01/09/2014   GLUCOSE 145* 01/09/2014   CHOL 159 09/02/2013   TRIG 59.0 09/02/2013   HDL 38.40* 09/02/2013   LDLDIRECT 136.8 12/12/2006   LDLCALC 109* 09/02/2013   ALT 16 12/17/2013   AST 18 12/17/2013   NA 139 01/09/2014   K 5.3* 01/09/2014   CL 100 01/09/2014   CREATININE 1.4 01/09/2014   BUN 23 01/09/2014   CO2 30 01/09/2014   TSH 1.07 01/09/2014   PSA 0.71 01/11/2013   HGBA1C 8.9* 01/09/2014   MICROALBUR 4.7* 05/07/2013       Assessment & Plan:

## 2014-02-06 ENCOUNTER — Ambulatory Visit (HOSPITAL_COMMUNITY)
Admission: RE | Admit: 2014-02-06 | Discharge: 2014-02-06 | Disposition: A | Discharge status: Home / Self Care | Payer: Medicare HMO | Source: Ambulatory Visit | Attending: Internal Medicine | Admitting: Internal Medicine

## 2014-02-06 DIAGNOSIS — M545 Low back pain, unspecified: Secondary | ICD-10-CM | POA: Insufficient documentation

## 2014-02-06 DIAGNOSIS — L97509 Non-pressure chronic ulcer of other part of unspecified foot with unspecified severity: Principal | ICD-10-CM

## 2014-02-06 DIAGNOSIS — M79609 Pain in unspecified limb: Secondary | ICD-10-CM | POA: Insufficient documentation

## 2014-02-06 DIAGNOSIS — Z9884 Bariatric surgery status: Secondary | ICD-10-CM | POA: Insufficient documentation

## 2014-02-06 DIAGNOSIS — E11621 Type 2 diabetes mellitus with foot ulcer: Secondary | ICD-10-CM

## 2014-02-06 NOTE — Assessment & Plan Note (Signed)
His exam is unremarkable I will check plain films to see if there is a fracture

## 2014-02-06 NOTE — Assessment & Plan Note (Signed)
Refer to the [facility] for evaluation and treatment

## 2014-02-11 ENCOUNTER — Encounter (HOSPITAL_BASED_OUTPATIENT_CLINIC_OR_DEPARTMENT_OTHER): Payer: Medicare HMO | Attending: General Surgery

## 2014-02-11 ENCOUNTER — Telehealth: Payer: Self-pay | Admitting: Internal Medicine

## 2014-02-11 DIAGNOSIS — L89109 Pressure ulcer of unspecified part of back, unspecified stage: Secondary | ICD-10-CM | POA: Insufficient documentation

## 2014-02-11 DIAGNOSIS — E1149 Type 2 diabetes mellitus with other diabetic neurological complication: Secondary | ICD-10-CM | POA: Insufficient documentation

## 2014-02-11 DIAGNOSIS — Z79899 Other long term (current) drug therapy: Secondary | ICD-10-CM | POA: Insufficient documentation

## 2014-02-11 DIAGNOSIS — E1142 Type 2 diabetes mellitus with diabetic polyneuropathy: Secondary | ICD-10-CM | POA: Insufficient documentation

## 2014-02-11 DIAGNOSIS — M545 Low back pain, unspecified: Secondary | ICD-10-CM

## 2014-02-11 DIAGNOSIS — E1169 Type 2 diabetes mellitus with other specified complication: Secondary | ICD-10-CM | POA: Insufficient documentation

## 2014-02-11 DIAGNOSIS — D649 Anemia, unspecified: Secondary | ICD-10-CM | POA: Insufficient documentation

## 2014-02-11 DIAGNOSIS — L97509 Non-pressure chronic ulcer of other part of unspecified foot with unspecified severity: Secondary | ICD-10-CM | POA: Insufficient documentation

## 2014-02-11 DIAGNOSIS — L8990 Pressure ulcer of unspecified site, unspecified stage: Secondary | ICD-10-CM | POA: Insufficient documentation

## 2014-02-11 DIAGNOSIS — Z794 Long term (current) use of insulin: Secondary | ICD-10-CM | POA: Insufficient documentation

## 2014-02-11 NOTE — Telephone Encounter (Signed)
He will need an MRI of the lumbar spine to look into that

## 2014-02-11 NOTE — H&P (Signed)
NAMEJOY, IVEY NO.:  [id number]  MEDICAL RECORD NO.:  [id number]  LOCATION:  FOOT                         FACILITY:  [facility]  PHYSICIAN:  Elesa Hacker, M.D.        DATE OF BIRTH:  [birth year]  DATE OF ADMISSION:  02/11/2014 DATE OF DISCHARGE:                             HISTORY & PHYSICAL   CHIEF COMPLAINT:  Wound on left foot.  HISTORY OF PRESENT ILLNESS:  Approximately 3 weeks ago the patient thinks he stepped on a nail.  He has developed a sizable wound on the plantar surface of his left foot.  He also has recently had a surgical repair of a hernia and developed a small wound on his back which he thinks from pressure.  PAST MEDICAL HISTORY:  Significant for: 1. Diabetes. 2. Back pain. 3. Bilateral lower extremity neuropathy.  PAST SURGICAL HISTORY:  Abdominal hernia repair and treatment in this clinic for diabetic ulcers of his toes.  SOCIAL HISTORY:  Cigarettes none.  Alcohol none.  MEDICATIONS:  Restasis for his eyes, Bentyl, Lasix, Norco, Levemir, levothyroxine, lisinopril, meloxicam, simvastatin, tramadol, ferrous sulfate.  ALLERGIES:  SHELLFISH.  REVIEW OF SYSTEMS:  As above.  PHYSICAL EXAMINATION:  VITAL SIGNS:  Temperature 98.5, pulse 65 and regular, respirations 19, blood pressure 125/70.  Glucose is 69. GENERAL APPEARANCE:  Well developed, morbidly obese, in no distress. CHEST:  Clear. HEART:  Regular rhythm. EXTREMITIES:  Examination of the lower extremities reveals peripheral pulses are palpable.  ABI is 1.1 bilaterally.  On the left plantar foot, there is a 1.8 x 1.9 x 0.2 full-thickness wound with a great deal of surrounding callus which is excised.  On the back, there was a 2.4 x 0.5 very superficial wound with a bright red base.  IMPRESSION:  Diabetic foot ulcer left plantar foot, and decubitus ulcer of the lower back.  PLAN OF TREATMENT:  X-ray has been performed.  Laboratory work has been performed.  The patient is under  treatment for anemia.  We will start with Santyl, Hydrogel, and offloading for the foot.  We will treat the back wound with collagen every other day.  We will see him in 7 days.     Elesa Hacker, M.D.     RA/MEDQ  D:  02/11/2014  T:  02/11/2014  Job:  [id number]

## 2014-02-11 NOTE — Telephone Encounter (Signed)
Patient is calling in regards to his most recent x-ray results of his lower back. He also wants to know if he can have an x-ray of his hips so that he can figure out why his leg is dragging. Please advise.

## 2014-02-14 ENCOUNTER — Encounter (INDEPENDENT_AMBULATORY_CARE_PROVIDER_SITE_OTHER): Payer: Medicare PPO | Admitting: General Surgery

## 2014-02-19 ENCOUNTER — Encounter (HOSPITAL_BASED_OUTPATIENT_CLINIC_OR_DEPARTMENT_OTHER): Payer: Medicare HMO | Attending: General Surgery

## 2014-02-19 DIAGNOSIS — L02219 Cutaneous abscess of trunk, unspecified: Secondary | ICD-10-CM | POA: Insufficient documentation

## 2014-02-19 DIAGNOSIS — T8140XA Infection following a procedure, unspecified, initial encounter: Secondary | ICD-10-CM | POA: Insufficient documentation

## 2014-02-19 DIAGNOSIS — Y838 Other surgical procedures as the cause of abnormal reaction of the patient, or of later complication, without mention of misadventure at the time of the procedure: Secondary | ICD-10-CM | POA: Insufficient documentation

## 2014-02-19 DIAGNOSIS — E1169 Type 2 diabetes mellitus with other specified complication: Secondary | ICD-10-CM | POA: Insufficient documentation

## 2014-02-19 DIAGNOSIS — L97509 Non-pressure chronic ulcer of other part of unspecified foot with unspecified severity: Secondary | ICD-10-CM | POA: Insufficient documentation

## 2014-02-19 DIAGNOSIS — L03319 Cellulitis of trunk, unspecified: Secondary | ICD-10-CM

## 2014-02-20 ENCOUNTER — Encounter (INDEPENDENT_AMBULATORY_CARE_PROVIDER_SITE_OTHER): Payer: Self-pay | Admitting: General Surgery

## 2014-02-20 ENCOUNTER — Telehealth (INDEPENDENT_AMBULATORY_CARE_PROVIDER_SITE_OTHER): Payer: Self-pay

## 2014-02-20 ENCOUNTER — Telehealth (INDEPENDENT_AMBULATORY_CARE_PROVIDER_SITE_OTHER): Payer: Self-pay | Admitting: *Deleted

## 2014-02-20 ENCOUNTER — Ambulatory Visit (INDEPENDENT_AMBULATORY_CARE_PROVIDER_SITE_OTHER): Payer: Commercial Managed Care - HMO | Admitting: General Surgery

## 2014-02-20 ENCOUNTER — Other Ambulatory Visit (INDEPENDENT_AMBULATORY_CARE_PROVIDER_SITE_OTHER): Payer: Self-pay

## 2014-02-20 VITALS — BP 124/82 | HR 74 | Temp 98.6°F | Resp 18 | Ht 72.0 in | Wt >= 6400 oz

## 2014-02-20 DIAGNOSIS — Z8719 Personal history of other diseases of the digestive system: Secondary | ICD-10-CM

## 2014-02-20 DIAGNOSIS — T8149XA Infection following a procedure, other surgical site, initial encounter: Secondary | ICD-10-CM

## 2014-02-20 DIAGNOSIS — Z9889 Other specified postprocedural states: Principal | ICD-10-CM

## 2014-02-20 DIAGNOSIS — T8143XA Infection following a procedure, organ and space surgical site, initial encounter: Secondary | ICD-10-CM

## 2014-02-20 DIAGNOSIS — T8140XA Infection following a procedure, unspecified, initial encounter: Secondary | ICD-10-CM

## 2014-02-20 MED ORDER — DOXYCYCLINE MONOHYDRATE 100 MG PO TABS: 100.0000 mg | ORAL_TABLET | Freq: Two times a day (BID) | ORAL | Status: DC

## 2014-02-20 NOTE — Progress Notes (Signed)
Subjective:     Patient ID: [name], male   DOB: [birth year], 58 y.o.   MRN: [id number]  HPI This is a 58 year old morbidly obese male who underwent an urgent repair of an incarcerated ventral hernia associated with a bowel obstruction. I did repair this with preperitoneal mesh at that time. He is done pretty well except for some wound issues were his pannus touches itself. This had a little bit of breakdown and some clear drainage but recently this has turned to a foul-smelling more purulent appearing drainage. He apparently was seen at the wound center for which I do not have any records and was prescribed some antibiotics. He denies a fever. He is otherwise eating well and has no changes in his bowel habits. He comes in today with this wound packed for evaluation. He's been given a prescription for doxycycline but is not been done this.  Review of Systems     Objective:   Physical Exam Large pannus with at the superior portion above his umbilicus he has purulence draining from a pinhole. I opened this today in its entirety. I cannot see his mesh. The purulence was all evacuated. This was then packed.    Assessment:     Status post open ventral hernia repair Wound infection     Plan:     I am not surprised he has a wound infection. I think this is adequately drained at this time. He will begin his antibiotics. Due to the difficulty in examining due to his morbid obesity I will have him get a CT scan to make sure there is not any additional collections present I will follow up with him next week.

## 2014-02-20 NOTE — Telephone Encounter (Signed)
Pt called and left a message on my voicemail that he had to cancel the CT that was set up for him for tomorrow, 02-21-14.  He states he cannot afford it.  He wants someone to call him back.   Please advise what the pt may need to do from here.  Thanks!  Anderson Malta

## 2014-02-20 NOTE — Telephone Encounter (Signed)
Called pt back after notifying Dr Donne Hazel of the pt canceling his CT for tomorrow. Dr Donne Hazel said if he can't afford the CT then he needs to see [place] next week for an appt. The pt has an appt on 6/12 with Dr Donne Hazel.

## 2014-02-20 NOTE — Telephone Encounter (Signed)
  Per Dr Cristal Generous order Patty RN at [facility] given order to clean open wd above umbilical area with saline and repack with iodoform packing,cover dry dsg BID. Can teach family to do wd care if can do adequate wd care. Will begin tomorrow.

## 2014-02-20 NOTE — Telephone Encounter (Signed)
Forwarding message to [facility] an Dr. Donne Hazel.

## 2014-02-21 ENCOUNTER — Other Ambulatory Visit: Payer: Commercial Managed Care - HMO

## 2014-02-21 ENCOUNTER — Telehealth (INDEPENDENT_AMBULATORY_CARE_PROVIDER_SITE_OTHER): Payer: Self-pay

## 2014-02-21 ENCOUNTER — Other Ambulatory Visit: Payer: Self-pay | Admitting: *Deleted

## 2014-02-21 ENCOUNTER — Ambulatory Visit (HOSPITAL_COMMUNITY): Payer: Medicare HMO

## 2014-02-21 MED ORDER — GLUCOSE BLOOD VI STRP: ORAL_STRIP | Status: DC

## 2014-02-21 NOTE — Telephone Encounter (Signed)
[name] would like to extend pts homecare visits. They would like to see the pt 3 times a week for another 4 weeks. Verbal order given to [facility] for these extra visits. Informed Scott that I would send Dr Donne Hazel a message and let him know. Scott verbalized understanding.

## 2014-02-24 ENCOUNTER — Ambulatory Visit (HOSPITAL_COMMUNITY): Payer: Medicare HMO

## 2014-02-24 ENCOUNTER — Other Ambulatory Visit: Payer: Self-pay

## 2014-02-24 MED ORDER — GLUCOSE BLOOD VI STRP: ORAL_STRIP | Status: DC

## 2014-02-25 NOTE — Telephone Encounter (Signed)
yes

## 2014-02-26 ENCOUNTER — Encounter: Payer: Self-pay | Admitting: Internal Medicine

## 2014-02-26 ENCOUNTER — Ambulatory Visit (INDEPENDENT_AMBULATORY_CARE_PROVIDER_SITE_OTHER): Payer: Commercial Managed Care - HMO | Admitting: Internal Medicine

## 2014-02-26 VITALS — BP 124/72 | HR 84 | Temp 98.2°F | Resp 16 | Ht 72.0 in | Wt >= 6400 oz

## 2014-02-26 DIAGNOSIS — M545 Low back pain, unspecified: Secondary | ICD-10-CM

## 2014-02-26 DIAGNOSIS — M25551 Pain in right hip: Secondary | ICD-10-CM

## 2014-02-26 DIAGNOSIS — M25559 Pain in unspecified hip: Secondary | ICD-10-CM

## 2014-02-26 DIAGNOSIS — E039 Hypothyroidism, unspecified: Secondary | ICD-10-CM

## 2014-02-26 DIAGNOSIS — E785 Hyperlipidemia, unspecified: Secondary | ICD-10-CM

## 2014-02-26 NOTE — Patient Instructions (Signed)
Hip Pain  The hips join the upper legs to the lower pelvis. The bones, cartilage, tendons, and muscles of the hip joint perform a lot of work each day holding your body weight and allowing you to move around.  Hip pain is a common symptom. It can range from a minor ache to severe pain on 1 or both hips. Pain may be felt on the inside of the hip joint near the groin, or the outside near the buttocks and upper thigh. There may be swelling or stiffness as well. It occurs more often when a person walks or performs activity. There are many reasons hip pain can develop.  CAUSES   It is important to work with your caregiver to identify the cause since many conditions can impact the bones, cartilage, muscles, and tendons of the hips. Causes for hip pain include:   Broken (fractured) bones.   Separation of the thighbone from the hip socket (dislocation).   Torn cartilage of the hip joint.   Swelling (inflammation) of a tendon (tendonitis), the sac within the hip joint (bursitis), or a joint.   A weakening in the abdominal wall (hernia), affecting the nerves to the hip.   Arthritis in the hip joint or lining of the hip joint.   Pinched nerves in the back, hip, or upper thigh.   A bulging disc in the spine (herniated disc).   Rarely, bone infection or cancer.  DIAGNOSIS   The location of your hip pain will help your caregiver understand what may be causing the pain. A diagnosis is based on your medical history, your symptoms, results from your physical exam, and results from diagnostic tests. Diagnostic tests may include X-ray exams, a computerized magnetic scan (magnetic resonance imaging, MRI), or bone scan.  TREATMENT   Treatment will depend on the cause of your hip pain. Treatment may include:   Limiting activities and resting until symptoms improve.   Crutches or other walking supports (a cane or brace).   Ice, elevation, and compression.   Physical therapy or home exercises.   Shoe inserts or special  shoes.   Losing weight.   Medications to reduce pain.   Undergoing surgery.  HOME CARE INSTRUCTIONS    Only take over-the-counter or prescription medicines for pain, discomfort, or fever as directed by your caregiver.   Put ice on the injured area:   Put ice in a plastic bag.   Place a towel between your skin and the bag.   Leave the ice on for 15-20 minutes at a time, 03-04 times a day.   Keep your leg raised (elevated) when possible to lessen swelling.   Avoid activities that cause pain.   Follow specific exercises as directed by your caregiver.   Sleep with a pillow between your legs on your most comfortable side.   Record how often you have hip pain, the location of the pain, and what it feels like. This information may be helpful to you and your caregiver.   Ask your caregiver about returning to work or sports and whether you should drive.   Follow up with your caregiver for further exams, therapy, or testing as directed.  SEEK MEDICAL CARE IF:    Your pain or swelling continues or worsens after 1 week.   You are feeling unwell or have chills.   You have increasing difficulty with walking.   You have a loss of sensation or other new symptoms.   You have questions or concerns.  SEEK   IMMEDIATE MEDICAL CARE IF:    You cannot put weight on the affected hip.   You have fallen.   You have a sudden increase in pain and swelling in your hip.   You have a fever.  MAKE SURE YOU:    Understand these instructions.   Will watch your condition.   Will get help right away if you are not doing well or get worse.  Document Released: 02/23/2010 Document Revised: 11/28/2011 Document Reviewed: 02/23/2010  [employer] Patient Information 2014 [employer].

## 2014-02-26 NOTE — Progress Notes (Signed)
Pre visit review using our clinic review tool, if applicable. No additional management support is needed unless otherwise documented below in the visit note. 

## 2014-02-26 NOTE — Progress Notes (Signed)
Subjective:    Patient ID: [name], male    DOB: 04-22-1956, 58 y.o.   MRN: [id number]  Hip Pain  The incident occurred more than 1 week ago. The incident occurred at home. The injury mechanism was a fall. The pain is present in the right hip. The pain is at a severity of 2/10. The pain is mild. The pain has been fluctuating since onset. Associated symptoms include a loss of motion (he has trouble using his right leg due to pain). Pertinent negatives include no inability to bear weight, loss of sensation, muscle weakness, numbness or tingling. He reports no foreign bodies present. The symptoms are aggravated by movement. Treatments tried: tramadol and norco. The treatment provided moderate relief.      Review of Systems  Constitutional: Negative.  Negative for fever, chills, diaphoresis, appetite change and fatigue.  HENT: Negative.   Eyes: Negative.   Respiratory: Negative.  Negative for cough, choking, chest tightness, shortness of breath and stridor.   Cardiovascular: Negative.  Negative for chest pain, palpitations and leg swelling.  Gastrointestinal: Negative.  Negative for nausea, vomiting, abdominal pain, diarrhea, constipation and blood in stool.  Endocrine: Negative.   Genitourinary: Negative.   Musculoskeletal: Positive for arthralgias. Negative for back pain, gait problem, joint swelling, myalgias, neck pain and neck stiffness.  Skin: Negative.  Negative for rash.  Allergic/Immunologic: Negative.   Neurological: Negative.  Negative for tingling and numbness.  Hematological: Negative.  Negative for adenopathy. Does not bruise/bleed easily.  Psychiatric/Behavioral: Negative.        Objective:   Physical Exam  Vitals reviewed. Constitutional: He appears well-developed and well-nourished. No distress.  HENT:  Head: Normocephalic and atraumatic.  Mouth/Throat: Oropharynx is clear and moist. No oropharyngeal exudate.  Eyes: Conjunctivae are normal. Right eye exhibits  no discharge. Left eye exhibits no discharge. No scleral icterus.  Neck: Normal range of motion. Neck supple. No JVD present. No tracheal deviation present. No thyromegaly present.  Cardiovascular: Normal rate, regular rhythm, normal heart sounds and intact distal pulses.  Exam reveals no gallop and no friction rub.   No murmur heard. Pulmonary/Chest: Effort normal and breath sounds normal. No stridor. No respiratory distress. He has no wheezes. He has no rales. He exhibits no tenderness.  Abdominal: Soft. Bowel sounds are normal. He exhibits no distension and no mass. There is no tenderness. There is no rebound and no guarding.  Musculoskeletal: He exhibits no edema and no tenderness.       Right hip: He exhibits decreased range of motion and decreased strength. He exhibits no tenderness, no bony tenderness, no swelling, no crepitus, no deformity and no laceration.  Lymphadenopathy:    He has no cervical adenopathy.  Neurological: He is alert. He has normal strength. He displays no atrophy, no tremor and normal reflexes. No cranial nerve deficit or sensory deficit. He exhibits normal muscle tone. He displays a negative Romberg sign. He displays no seizure activity. Coordination and gait normal.  Reflex Scores:      Tricep reflexes are 0 on the right side and 0 on the left side.      Bicep reflexes are 0 on the right side and 0 on the left side.      Brachioradialis reflexes are 0 on the right side and 0 on the left side.      Patellar reflexes are 0 on the right side and 0 on the left side.      Achilles reflexes are 0 on the  right side and 0 on the left side. Neg SLR in BLE  Skin: Skin is warm and dry. No rash noted. He is not diaphoretic. No erythema. No pallor.  Psychiatric: He has a normal mood and affect. His behavior is normal. Judgment and thought content normal.     Lab Results  Component Value Date   WBC 9.9 01/09/2014   HGB 9.9 Repeated and verified X2.* 01/09/2014   HCT 30.7*  01/09/2014   PLT 325.0 01/09/2014   GLUCOSE 145* 01/09/2014   CHOL 159 09/02/2013   TRIG 59.0 09/02/2013   HDL 38.40* 09/02/2013   LDLDIRECT 136.8 12/12/2006   LDLCALC 109* 09/02/2013   ALT 16 12/17/2013   AST 18 12/17/2013   NA 139 01/09/2014   K 5.3* 01/09/2014   CL 100 01/09/2014   CREATININE 1.4 01/09/2014   BUN 23 01/09/2014   CO2 30 01/09/2014   TSH 1.07 01/09/2014   PSA 0.71 01/11/2013   HGBA1C 8.9* 01/09/2014   MICROALBUR 4.7* 05/07/2013       Assessment & Plan:

## 2014-02-27 ENCOUNTER — Encounter: Payer: Self-pay | Admitting: Internal Medicine

## 2014-02-27 ENCOUNTER — Ambulatory Visit (HOSPITAL_COMMUNITY): Payer: Medicare HMO

## 2014-02-27 ENCOUNTER — Ambulatory Visit (HOSPITAL_COMMUNITY)
Admission: RE | Admit: 2014-02-27 | Discharge: 2014-02-27 | Disposition: A | Discharge status: Home / Self Care | Payer: Medicare HMO | Source: Ambulatory Visit | Attending: Internal Medicine | Admitting: Internal Medicine

## 2014-02-27 ENCOUNTER — Ambulatory Visit: Payer: Commercial Managed Care - HMO | Admitting: *Deleted

## 2014-02-27 DIAGNOSIS — M1610 Unilateral primary osteoarthritis, unspecified hip: Secondary | ICD-10-CM | POA: Insufficient documentation

## 2014-02-27 DIAGNOSIS — M25559 Pain in unspecified hip: Secondary | ICD-10-CM | POA: Insufficient documentation

## 2014-02-27 DIAGNOSIS — M25551 Pain in right hip: Secondary | ICD-10-CM

## 2014-02-27 DIAGNOSIS — M169 Osteoarthritis of hip, unspecified: Secondary | ICD-10-CM | POA: Insufficient documentation

## 2014-02-27 NOTE — Assessment & Plan Note (Signed)
Plain film is negative for fracture There is a lot of DJD Will cont the current meds for pain

## 2014-02-27 NOTE — Assessment & Plan Note (Signed)
Plain films were normal I have ordered an MRI to see if there is lumbar pathology to explain his pain and difficulty using his right leg

## 2014-02-28 ENCOUNTER — Encounter (INDEPENDENT_AMBULATORY_CARE_PROVIDER_SITE_OTHER): Payer: Medicare PPO | Admitting: General Surgery

## 2014-03-03 ENCOUNTER — Ambulatory Visit (HOSPITAL_COMMUNITY)
Admission: RE | Admit: 2014-03-03 | Discharge: 2014-03-03 | Disposition: A | Discharge status: Home / Self Care | Payer: Medicare HMO | Source: Ambulatory Visit | Attending: General Surgery | Admitting: General Surgery

## 2014-03-03 ENCOUNTER — Other Ambulatory Visit (INDEPENDENT_AMBULATORY_CARE_PROVIDER_SITE_OTHER): Payer: Self-pay | Admitting: General Surgery

## 2014-03-03 ENCOUNTER — Encounter (HOSPITAL_COMMUNITY): Payer: Self-pay

## 2014-03-03 DIAGNOSIS — T8149XA Infection following a procedure, other surgical site, initial encounter: Secondary | ICD-10-CM

## 2014-03-03 DIAGNOSIS — Z9884 Bariatric surgery status: Secondary | ICD-10-CM | POA: Insufficient documentation

## 2014-03-03 DIAGNOSIS — Z85038 Personal history of other malignant neoplasm of large intestine: Secondary | ICD-10-CM | POA: Insufficient documentation

## 2014-03-03 DIAGNOSIS — Z8719 Personal history of other diseases of the digestive system: Secondary | ICD-10-CM | POA: Insufficient documentation

## 2014-03-03 LAB — BUN: BUN: 32 mg/dL — ABNORMAL HIGH (ref 6–23)

## 2014-03-03 LAB — CREATININE, SERUM: Creat: 1.59 mg/dL — ABNORMAL HIGH (ref 0.50–1.35)

## 2014-03-03 MED ORDER — IOHEXOL 300 MG/ML  SOLN
125.0000 mL | Freq: Once | INTRAMUSCULAR | Status: AC | PRN
  Administered 2014-03-03: 125 mL via INTRAVENOUS

## 2014-03-04 ENCOUNTER — Encounter (INDEPENDENT_AMBULATORY_CARE_PROVIDER_SITE_OTHER): Payer: Self-pay | Admitting: General Surgery

## 2014-03-04 ENCOUNTER — Ambulatory Visit (INDEPENDENT_AMBULATORY_CARE_PROVIDER_SITE_OTHER): Payer: Commercial Managed Care - HMO | Admitting: General Surgery

## 2014-03-04 VITALS — BP 136/78 | HR 76 | Temp 97.9°F | Resp 24 | Ht 72.0 in | Wt >= 6400 oz

## 2014-03-04 DIAGNOSIS — Z09 Encounter for follow-up examination after completed treatment for conditions other than malignant neoplasm: Secondary | ICD-10-CM

## 2014-03-04 MED ORDER — DOXYCYCLINE MONOHYDRATE 100 MG PO TABS: 100.0000 mg | ORAL_TABLET | Freq: Two times a day (BID) | ORAL | Status: DC

## 2014-03-04 NOTE — Progress Notes (Signed)
Subjective:     Patient ID: [name], male   DOB: [birth year], 58 y.o.   MRN: [id number]  HPI 45 yom s/p preperitoneal repair of incarcerated vh with bowel obstruction using mesh.  I opened a superficial abscess below his pannus last time. He is doing dressing changes. This is improving. He otherwise feels much better without fevers.  Review of Systems EXAM:  CT ABDOMEN AND PELVIS WITH CONTRAST  TECHNIQUE:  Multidetector CT imaging of the abdomen and pelvis was performed  using the standard protocol following bolus administration of  intravenous contrast.  CONTRAST: 147mL OMNIPAQUE IOHEXOL 300 MG/ML SOLN  COMPARISON: 12/17/2013.  FINDINGS:  Liver normal. Spleen normal. Pancreas normal. No biliary distention.  Gall stone. No gallbladder distention or pericholecystic fluid.  Adrenals normal. Kidneys are normal. No hydronephrosis. No evidence  of obstructing ureteral stone. Bladder nondistended. Prostate  unremarkable.  No significant inguinal or retroperitoneal lymph nodes noted few  shotty retroperitoneal lymph nodes are present. Abdominal aorta  normal in caliber. Visceral vessels are patent.  Appendix normal. No inflammatory changes are noted in the right or  left lower quadrants. Large amount of stool is noted within the  colon. No evidence of bowel distention or obstruction. No free air.  A lap band is noted stomach is nondistended. Noted in the midline  abdominal wall is prominent soft tissue swelling and air. Packing  material is noted. No contrast leakage into this wound is definitely  identified. This is at the site of prior ventral hernia repair. No  new hernia noted.  Lung bases are clear. Heart size normal. Degenerative changes lumbar  spine and both hips. Postsurgical changes both hips.  IMPRESSION:  1. Midline incisional open wound and edema. No abscess. No definite  contrast leakage from bowel noted within the wound. No evidence of  recurrent hernia.  2.  Gallstone. Lap band.     Objective:   Physical Exam Wound healed eexcept for 2 cm opening that tracks to about 4 cm, no more infection noted, packed again today    Assessment:     S/p vh repair Wound infection     Plan:     He is going to continue dressing changes for now. I am concerned about infection of his mesh as well as his lap band port given both of their proximity to this infection but I think they're separated there is no evident disease or infected right now. We're just going to keep him on antibiotics for a prolonged period he is going to continue dressing changes and I will see him back in 4 weeks. CT scan indicates that this is just a superficial infection with no further treatment that needs to be done.

## 2014-03-10 ENCOUNTER — Ambulatory Visit (HOSPITAL_COMMUNITY): Admission: RE | Admit: 2014-03-10 | Payer: Medicare HMO | Source: Ambulatory Visit

## 2014-03-17 ENCOUNTER — Telehealth (INDEPENDENT_AMBULATORY_CARE_PROVIDER_SITE_OTHER): Payer: Self-pay

## 2014-03-17 NOTE — Telephone Encounter (Signed)
Scott with [facility] called requesting HHN visits Monday thru Friday. Pt is not able to properly pack wd and HHN concerned wd will close over cavity. I advised Nicki Reaper this request will be sent to Dr Donne Hazel to review. Nicki Reaper can be reached at [phone].

## 2014-03-17 NOTE — Telephone Encounter (Signed)
Scott at [facility] called and given order.

## 2014-03-17 NOTE — Telephone Encounter (Signed)
yes

## 2014-03-18 ENCOUNTER — Other Ambulatory Visit: Payer: Self-pay

## 2014-03-18 ENCOUNTER — Telehealth: Payer: Self-pay | Admitting: Pulmonary Disease

## 2014-03-18 NOTE — Telephone Encounter (Signed)
According to order back in April, it was sent to [name]. Called pt line rang numerous times, NA, NV, WCB

## 2014-03-19 ENCOUNTER — Encounter (HOSPITAL_BASED_OUTPATIENT_CLINIC_OR_DEPARTMENT_OTHER): Payer: Medicare HMO | Attending: General Surgery

## 2014-03-19 DIAGNOSIS — T8140XA Infection following a procedure, unspecified, initial encounter: Secondary | ICD-10-CM | POA: Insufficient documentation

## 2014-03-19 DIAGNOSIS — L97509 Non-pressure chronic ulcer of other part of unspecified foot with unspecified severity: Secondary | ICD-10-CM | POA: Insufficient documentation

## 2014-03-19 DIAGNOSIS — L02219 Cutaneous abscess of trunk, unspecified: Secondary | ICD-10-CM | POA: Insufficient documentation

## 2014-03-19 DIAGNOSIS — L03319 Cellulitis of trunk, unspecified: Secondary | ICD-10-CM

## 2014-03-19 DIAGNOSIS — Y838 Other surgical procedures as the cause of abnormal reaction of the patient, or of later complication, without mention of misadventure at the time of the procedure: Secondary | ICD-10-CM | POA: Diagnosis not present

## 2014-03-19 DIAGNOSIS — E1169 Type 2 diabetes mellitus with other specified complication: Secondary | ICD-10-CM | POA: Diagnosis not present

## 2014-03-19 NOTE — Telephone Encounter (Signed)
Gave pt [facility] 's #

## 2014-03-19 NOTE — Telephone Encounter (Signed)
ATC, NA, no voicemail. [place], CMA

## 2014-03-24 ENCOUNTER — Ambulatory Visit (INDEPENDENT_AMBULATORY_CARE_PROVIDER_SITE_OTHER): Payer: Commercial Managed Care - HMO | Admitting: Internal Medicine

## 2014-03-24 ENCOUNTER — Telehealth: Payer: Self-pay | Admitting: Internal Medicine

## 2014-03-24 ENCOUNTER — Encounter: Payer: Self-pay | Admitting: Internal Medicine

## 2014-03-24 VITALS — BP 140/74 | HR 101 | Temp 98.2°F | Resp 16 | Ht 72.0 in | Wt >= 6400 oz

## 2014-03-24 DIAGNOSIS — I5032 Chronic diastolic (congestive) heart failure: Secondary | ICD-10-CM

## 2014-03-24 DIAGNOSIS — M25559 Pain in unspecified hip: Secondary | ICD-10-CM

## 2014-03-24 DIAGNOSIS — R059 Cough, unspecified: Secondary | ICD-10-CM | POA: Insufficient documentation

## 2014-03-24 DIAGNOSIS — R05 Cough: Secondary | ICD-10-CM

## 2014-03-24 DIAGNOSIS — Z23 Encounter for immunization: Secondary | ICD-10-CM

## 2014-03-24 DIAGNOSIS — I509 Heart failure, unspecified: Secondary | ICD-10-CM

## 2014-03-24 DIAGNOSIS — M25551 Pain in right hip: Secondary | ICD-10-CM

## 2014-03-24 MED ORDER — INSULIN PEN NEEDLE 31G X 5 MM MISC: Status: DC

## 2014-03-24 NOTE — Progress Notes (Signed)
Pre visit review using our clinic review tool, if applicable. No additional management support is needed unless otherwise documented below in the visit note. 

## 2014-03-24 NOTE — Patient Instructions (Signed)
Cough, Adult  A cough is a reflex that helps clear your throat and airways. It can help heal the body or may be a reaction to an irritated airway. A cough may only last 2 or 3 weeks (acute) or may last more than 8 weeks (chronic).  CAUSES Acute cough:  Viral or bacterial infections. Chronic cough:  Infections.  Allergies.  Asthma.  Post-nasal drip.  Smoking.  Heartburn or acid reflux.  Some medicines.  Chronic lung problems (COPD).  Cancer. SYMPTOMS   Cough.  Fever.  Chest pain.  Increased breathing rate.  High-pitched whistling sound when breathing (wheezing).  Colored mucus that you cough up (sputum). TREATMENT   A bacterial cough may be treated with antibiotic medicine.  A viral cough must run its course and will not respond to antibiotics.  Your caregiver may recommend other treatments if you have a chronic cough. HOME CARE INSTRUCTIONS   Only take over-the-counter or prescription medicines for pain, discomfort, or fever as directed by your caregiver. Use cough suppressants only as directed by your caregiver.  Use a cold steam vaporizer or humidifier in your bedroom or home to help loosen secretions.  Sleep in a semi-upright position if your cough is worse at night.  Rest as needed.  Stop smoking if you smoke. SEEK IMMEDIATE MEDICAL CARE IF:   You have pus in your sputum.  Your cough starts to worsen.  You cannot control your cough with suppressants and are losing sleep.  You begin coughing up blood.  You have difficulty breathing.  You develop pain which is getting worse or is uncontrolled with medicine.  You have a fever. MAKE SURE YOU:   Understand these instructions.  Will watch your condition.  Will get help right away if you are not doing well or get worse. Document Released: 03/04/2011 Document Revised: 11/28/2011 Document Reviewed: 03/04/2011 ExitCare Patient Information 2015 [employer]. This information is not intended  to replace advice given to you by your health care provider. Make sure you discuss any questions you have with your health care provider.  

## 2014-03-24 NOTE — Telephone Encounter (Signed)
Pt came in to request Rx for needles for his insulin. Pt would like this request sent to the [place] on [facility]. Please contact pt when the request is complete.

## 2014-03-24 NOTE — Progress Notes (Signed)
Subjective:    Patient ID: [name], male    DOB: [birth year], 58 y.o.   MRN: [id number]  Cough This is a recurrent problem. The current episode started more than 1 month ago. The problem has been gradually worsening. The problem occurs every few hours. The cough is non-productive. Associated symptoms include shortness of breath (chronic, unchanged). Pertinent negatives include no chest pain, chills, ear congestion, ear pain, fever, headaches, heartburn, hemoptysis, myalgias, nasal congestion, postnasal drip, rash, rhinorrhea, sore throat, sweats, weight loss or wheezing. Nothing aggravates the symptoms. He has tried nothing for the symptoms. The treatment provided no relief. There is no history of asthma, bronchiectasis, bronchitis, COPD, emphysema, environmental allergies or pneumonia.      Review of Systems  Constitutional: Positive for fatigue. Negative for fever, chills, weight loss, diaphoresis, appetite change and unexpected weight change.  HENT: Negative.  Negative for ear pain, postnasal drip, rhinorrhea and sore throat.   Eyes: Negative.   Respiratory: Positive for apnea, cough and shortness of breath (chronic, unchanged). Negative for hemoptysis, choking, chest tightness, wheezing and stridor.   Cardiovascular: Negative.  Negative for chest pain, palpitations and leg swelling.  Gastrointestinal: Negative.  Negative for heartburn, nausea, abdominal pain, diarrhea, constipation and blood in stool.  Endocrine: Negative.   Genitourinary: Negative.   Musculoskeletal: Positive for arthralgias (right hip and thigh pain continues). Negative for back pain, gait problem, joint swelling, myalgias, neck pain and neck stiffness.  Skin: Negative.  Negative for rash.  Allergic/Immunologic: Negative.  Negative for environmental allergies.  Neurological: Negative.  Negative for headaches.  Hematological: Negative.  Negative for adenopathy. Does not bruise/bleed easily.    Psychiatric/Behavioral: Negative.        Objective:   Physical Exam  Vitals reviewed. Constitutional: He is oriented to person, place, and time. He appears well-developed and well-nourished.  Non-toxic appearance. He has a sickly appearance (ambulates only with a 4 pronged walker). He does not appear ill. No distress.  HENT:  Head: Normocephalic and atraumatic.  Mouth/Throat: Oropharynx is clear and moist. No oropharyngeal exudate.  Eyes: Conjunctivae are normal. Right eye exhibits no discharge. Left eye exhibits no discharge. No scleral icterus.  Neck: Normal range of motion. Neck supple. No JVD present. No tracheal deviation present. No thyromegaly present.  Cardiovascular: Normal rate, regular rhythm, normal heart sounds and intact distal pulses.  Exam reveals no gallop and no friction rub.   No murmur heard. Pulmonary/Chest: Effort normal and breath sounds normal. No stridor. No respiratory distress. He has no wheezes. He has no rales. He exhibits no tenderness.  Abdominal: Soft. Bowel sounds are normal. He exhibits no distension and no mass. There is no tenderness. There is no rebound and no guarding.  Musculoskeletal: Normal range of motion. He exhibits edema (1+ edema in BLE). He exhibits no tenderness.  Lymphadenopathy:    He has no cervical adenopathy.  Neurological: He is oriented to person, place, and time.  Skin: Skin is warm and dry. No rash noted. He is not diaphoretic. No erythema. No pallor.  Psychiatric: He has a normal mood and affect. His behavior is normal. Judgment and thought content normal.     Lab Results  Component Value Date   WBC 9.9 01/09/2014   HGB 9.9 Repeated and verified X2.* 01/09/2014   HCT 30.7* 01/09/2014   PLT 325.0 01/09/2014   GLUCOSE 145* 01/09/2014   CHOL 159 09/02/2013   TRIG 59.0 09/02/2013   HDL 38.40* 09/02/2013   LDLDIRECT 136.8 12/12/2006  LDLCALC 109* 09/02/2013   ALT 16 12/17/2013   AST 18 12/17/2013   NA 139 01/09/2014   K 5.3*  01/09/2014   CL 100 01/09/2014   CREATININE 1.59* 03/03/2014   BUN 32* 03/03/2014   CO2 30 01/09/2014   TSH 1.07 01/09/2014   PSA 0.71 01/11/2013   HGBA1C 8.9* 01/09/2014   MICROALBUR 4.7* 05/07/2013       Assessment & Plan:

## 2014-03-25 NOTE — Assessment & Plan Note (Signed)
I think this is due to the ACEI - will d'c lisinopril

## 2014-03-25 NOTE — Assessment & Plan Note (Signed)
I have asked him to see ortho about this

## 2014-03-26 ENCOUNTER — Telehealth: Payer: Self-pay | Admitting: *Deleted

## 2014-03-26 DIAGNOSIS — E1169 Type 2 diabetes mellitus with other specified complication: Secondary | ICD-10-CM | POA: Diagnosis not present

## 2014-03-26 DIAGNOSIS — L97509 Non-pressure chronic ulcer of other part of unspecified foot with unspecified severity: Secondary | ICD-10-CM | POA: Diagnosis not present

## 2014-03-26 DIAGNOSIS — T8140XA Infection following a procedure, unspecified, initial encounter: Secondary | ICD-10-CM | POA: Diagnosis not present

## 2014-03-26 DIAGNOSIS — L02219 Cutaneous abscess of trunk, unspecified: Secondary | ICD-10-CM | POA: Diagnosis not present

## 2014-03-26 MED ORDER — BISOPROLOL FUMARATE 10 MG PO TABS: 10.0000 mg | ORAL_TABLET | Freq: Every day | ORAL | Status: DC

## 2014-03-26 NOTE — Telephone Encounter (Signed)
Pt stated md d/c his lisinopril ,and for him to continue taking the bisoprolol until she is able to see his cardiologist. Pt is needing a refill on the bisoprolol. Inform pt will send to [facility]...[name]

## 2014-04-01 ENCOUNTER — Encounter (INDEPENDENT_AMBULATORY_CARE_PROVIDER_SITE_OTHER): Payer: Commercial Managed Care - HMO | Admitting: General Surgery

## 2014-04-02 ENCOUNTER — Telehealth (INDEPENDENT_AMBULATORY_CARE_PROVIDER_SITE_OTHER): Payer: Self-pay | Admitting: *Deleted

## 2014-04-02 NOTE — Telephone Encounter (Signed)
Per Dr. Donne Hazel, ok verbal agreement for [facility] home health to extend their services until his next appt 04-25-14.  I called Scott, RN with [facility] and gave the verbal and confirmed pt's f/u appt.  Anderson Malta

## 2014-04-02 NOTE — Telephone Encounter (Signed)
Sure he at some point needs another appt

## 2014-04-02 NOTE — Telephone Encounter (Signed)
Nicki Reaper, from [facility], called regarding pt's home health svcs.  His order expires this Friday, 04-04-14 and Nicki Reaper was wanting a verbal order to extend this svc until pt's f/u appt with Dr. Donne Hazel 04-24-14.  Pt has home health for wound care to abd.  Please advise!  Anderson Malta

## 2014-04-04 ENCOUNTER — Telehealth: Payer: Self-pay | Admitting: Internal Medicine

## 2014-04-04 NOTE — Telephone Encounter (Signed)
Pt call stated that [facility] [place] [facility]) request for our office to get written rx for wheelchair and order PT and OT  Value treatmen to send to them. Fax # [phone] and Nira Conn phone # [phone].   Pt is aware that Dr. [name] is out of the office and will not return until 04/14/14.

## 2014-04-05 NOTE — Telephone Encounter (Signed)
This is ok with me  

## 2014-04-07 NOTE — Telephone Encounter (Signed)
Calledpt for more details, no answer or VM. Order faxed to [facility] [phone]) per his request for wheelchair, unsure of what is needed for  PT/OT.

## 2014-04-09 DIAGNOSIS — L03319 Cellulitis of trunk, unspecified: Secondary | ICD-10-CM | POA: Diagnosis not present

## 2014-04-09 DIAGNOSIS — E1169 Type 2 diabetes mellitus with other specified complication: Secondary | ICD-10-CM | POA: Diagnosis not present

## 2014-04-09 DIAGNOSIS — T8140XA Infection following a procedure, unspecified, initial encounter: Secondary | ICD-10-CM | POA: Diagnosis not present

## 2014-04-09 DIAGNOSIS — L97509 Non-pressure chronic ulcer of other part of unspecified foot with unspecified severity: Secondary | ICD-10-CM | POA: Diagnosis not present

## 2014-04-09 DIAGNOSIS — L02219 Cutaneous abscess of trunk, unspecified: Secondary | ICD-10-CM | POA: Diagnosis not present

## 2014-04-11 ENCOUNTER — Telehealth (INDEPENDENT_AMBULATORY_CARE_PROVIDER_SITE_OTHER): Payer: Self-pay | Admitting: *Deleted

## 2014-04-11 NOTE — Telephone Encounter (Signed)
Scott from [place] called regarding pt.  He states the pt is having increased drainage from the Abd wound.  He states it is yellow and has an order even after the cleansing.  He advises that they do the dressing change 1 x daily mon-Friday.  He advises that they pack with Iodoform.  Nicki Reaper advises that he notices that the pt is working harder to breath today and has increased weakness.    I offered a [place] office appt this afternoon to see Dr. Dalbert Batman but Nicki Reaper advised that the pt could not make that.  I advised Nicki Reaper since that was the case, that the pt needed to be seen in the ER b/c it sounds like an infection in the wound and with shortness of breath, he needed to be treated now.  Scott agreed!  I advised I would document the call from him today and let Dr. Donne Hazel and Lars Mage aware of it as well.  Nicki Reaper was very Patent attorney.  Anderson Malta

## 2014-04-11 NOTE — Telephone Encounter (Signed)
Pt calling back to see if Dr Donne Hazel will call in another abx for him. After speaking with Lars Mage, pt was has not been seen in the office since June 16. Pt needs to come into the office to be evaluated. Informed pt that he needs to come into our office to be evaluated. Pt states that he does not have the time nor the money to come into our office. Advised pt that if  He develops any fevers or chills he needs to call our office or he needs to go to the nearest ER. Pt verbalized understanding.

## 2014-04-14 ENCOUNTER — Telehealth: Payer: Self-pay | Admitting: Internal Medicine

## 2014-04-14 NOTE — Telephone Encounter (Signed)
He has missed appointments and really needs to be seen again before I decide what we need to do.

## 2014-04-14 NOTE — Telephone Encounter (Signed)
Mr. Israelson call back to give this # for our office to call and get more info for PT/OT for his wheelchair. [phone]. Pt stated we just call this number and they will be able to help.

## 2014-04-14 NOTE — Telephone Encounter (Signed)
Order faxed per request.

## 2014-04-15 ENCOUNTER — Encounter (HOSPITAL_COMMUNITY): Payer: Self-pay | Admitting: Emergency Medicine

## 2014-04-15 ENCOUNTER — Emergency Department (HOSPITAL_COMMUNITY)
Admission: EM | Admit: 2014-04-15 | Discharge: 2014-04-15 | Disposition: A | Discharge status: Home / Self Care | Payer: Medicare HMO | Attending: Emergency Medicine | Admitting: Emergency Medicine

## 2014-04-15 DIAGNOSIS — Z7982 Long term (current) use of aspirin: Secondary | ICD-10-CM | POA: Insufficient documentation

## 2014-04-15 DIAGNOSIS — I10 Essential (primary) hypertension: Secondary | ICD-10-CM | POA: Insufficient documentation

## 2014-04-15 DIAGNOSIS — Z85038 Personal history of other malignant neoplasm of large intestine: Secondary | ICD-10-CM | POA: Insufficient documentation

## 2014-04-15 DIAGNOSIS — Z792 Long term (current) use of antibiotics: Secondary | ICD-10-CM | POA: Insufficient documentation

## 2014-04-15 DIAGNOSIS — D649 Anemia, unspecified: Secondary | ICD-10-CM | POA: Insufficient documentation

## 2014-04-15 DIAGNOSIS — T798XXD Other early complications of trauma, subsequent encounter: Secondary | ICD-10-CM

## 2014-04-15 DIAGNOSIS — Z9889 Other specified postprocedural states: Secondary | ICD-10-CM | POA: Diagnosis not present

## 2014-04-15 DIAGNOSIS — G4733 Obstructive sleep apnea (adult) (pediatric): Secondary | ICD-10-CM | POA: Diagnosis not present

## 2014-04-15 DIAGNOSIS — Y838 Other surgical procedures as the cause of abnormal reaction of the patient, or of later complication, without mention of misadventure at the time of the procedure: Secondary | ICD-10-CM | POA: Insufficient documentation

## 2014-04-15 DIAGNOSIS — E669 Obesity, unspecified: Secondary | ICD-10-CM | POA: Diagnosis not present

## 2014-04-15 DIAGNOSIS — T8189XA Other complications of procedures, not elsewhere classified, initial encounter: Secondary | ICD-10-CM

## 2014-04-15 DIAGNOSIS — R109 Unspecified abdominal pain: Secondary | ICD-10-CM | POA: Insufficient documentation

## 2014-04-15 DIAGNOSIS — Z794 Long term (current) use of insulin: Secondary | ICD-10-CM | POA: Insufficient documentation

## 2014-04-15 DIAGNOSIS — Z79899 Other long term (current) drug therapy: Secondary | ICD-10-CM | POA: Diagnosis not present

## 2014-04-15 DIAGNOSIS — T8140XA Infection following a procedure, unspecified, initial encounter: Secondary | ICD-10-CM | POA: Insufficient documentation

## 2014-04-15 DIAGNOSIS — E119 Type 2 diabetes mellitus without complications: Secondary | ICD-10-CM | POA: Diagnosis not present

## 2014-04-15 DIAGNOSIS — I509 Heart failure, unspecified: Secondary | ICD-10-CM | POA: Diagnosis not present

## 2014-04-15 DIAGNOSIS — Z9981 Dependence on supplemental oxygen: Secondary | ICD-10-CM | POA: Diagnosis not present

## 2014-04-15 DIAGNOSIS — E785 Hyperlipidemia, unspecified: Secondary | ICD-10-CM | POA: Insufficient documentation

## 2014-04-15 LAB — BASIC METABOLIC PANEL
ANION GAP: 12 (ref 5–15)
BUN: 31 mg/dL — ABNORMAL HIGH (ref 6–23)
CALCIUM: 9.3 mg/dL (ref 8.4–10.5)
CO2: 26 meq/L (ref 19–32)
CREATININE: 1.33 mg/dL (ref 0.50–1.35)
Chloride: 104 mEq/L (ref 96–112)
GFR calc Af Amer: 68 mL/min — ABNORMAL LOW (ref >90)
GFR calc non Af Amer: 58 mL/min — ABNORMAL LOW (ref >90)
Glucose, Bld: 99 mg/dL (ref 70–99)
Potassium: 4.4 mEq/L (ref 3.7–5.3)
Sodium: 142 mEq/L (ref 137–147)

## 2014-04-15 LAB — CBC WITH DIFFERENTIAL/PLATELET
BASOS ABS: 0 10*3/uL (ref 0.0–0.1)
Basophils Relative: 0 % (ref 0–1)
Eosinophils Absolute: 0.3 10*3/uL (ref 0.0–0.7)
Eosinophils Relative: 5 % (ref 0–5)
HEMATOCRIT: 30.3 % — AB (ref 39.0–52.0)
Hemoglobin: 9.5 g/dL — ABNORMAL LOW (ref 13.0–17.0)
LYMPHS PCT: 24 % (ref 12–46)
Lymphs Abs: 1.6 10*3/uL (ref 0.7–4.0)
MCH: 26.5 pg (ref 26.0–34.0)
MCHC: 31.4 g/dL (ref 30.0–36.0)
MCV: 84.4 fL (ref 78.0–100.0)
MONO ABS: 0.5 10*3/uL (ref 0.1–1.0)
Monocytes Relative: 8 % (ref 3–12)
Neutro Abs: 4.2 10*3/uL (ref 1.7–7.7)
Neutrophils Relative %: 63 % (ref 43–77)
Platelets: 251 10*3/uL (ref 150–400)
RBC: 3.59 MIL/uL — ABNORMAL LOW (ref 4.22–5.81)
RDW: 17.2 % — AB (ref 11.5–15.5)
WBC: 6.7 10*3/uL (ref 4.0–10.5)

## 2014-04-15 MED ORDER — AMOXICILLIN-POT CLAVULANATE 875-125 MG PO TABS: 1.0000 | ORAL_TABLET | Freq: Two times a day (BID) | ORAL | Status: DC

## 2014-04-15 NOTE — Discharge Instructions (Signed)
WOUND CARE  It is important that the wound be kept open.   -Keeping the skin edges apart will allow the wound to gradually heal from the base upwards.   - If the skin edges of the wound close too early, a new fluid pocket can form and infection can occur. -This is the reason to pack deeper wounds with gauze or ribbon -This is why drained wounds cannot be sewed closed right away  A healthy wound should form a lining of bright red "beefy" granulating tissue that will help shrink the wound and help the edges grow new skin into it.   -A little mucus / yellow discharge is normal (the body's natural way to try and form a scab) and should be gently washed off with soap and water with daily dressing changes.  -Green or foul smelling drainage implies bacterial colonization and can slow wound healing - a short course of antibiotic ointment (3-5 days) can help it clear up.  Call the doctor if it does not improve or worsens  -Avoid use of antibiotic ointments for more than a week as they can slow wound healing over time.    -Sometimes other wound care products will be used to reduce need for dressing changes and/or help clean up dirty wounds -Sometimes the surgeon needs to debride the wound in the office to remove dead or infected tissue out of the wound so it can heal more quickly and safely.    Change the dressing at least once a day -Wash the wound with mild soap and water gently every day.  It is good to shower or bathe the wound to help it clean out. -change 3 times per day.  -pack with plain 1/4 inch packing using a q-tip. -Pack the wound down to the base -The goal is to keep the skin apart, not overpack the wound -Use a Q-tip or blunt-tipped kabob stick toothpick to push the gauze down to the base in narrow or deep wounds   -Cover with a clean gauze and tape -paper or Medipore tape tend to be gentle on the skin -rotate the orientation of the tape to avoid repeated stress/trauma on the skin -using  an ACE or Coban wrap on wounds on arms or legs can be used instead.  Complete all antibiotics through the entire prescription to help the infection heal and prevent new places of infection   Returning the see the surgeon is helpful to follow the healing process and help the wound close as fast as possible.

## 2014-04-15 NOTE — ED Provider Notes (Signed)
CSN: [id number]     Arrival date & time 04/15/14  [id number] History   First MD Initiated Contact with Patient 04/15/14 0914     Chief Complaint  Patient presents with  . Abdominal Pain     (Consider location/radiation/quality/duration/timing/severity/associated sxs/prior Treatment) Patient is a 58 y.o. male presenting with abdominal pain. The history is provided by the patient.  Abdominal Pain  He complains of pain, swelling, and drainage from the abdominal for several months. The problem has worsened in the last several days. He was last evaluated by his home health nurse, yesterday, and had the wound packed at that time. He has frequent home assessments by nursing, with frequent packing. He has not seen his surgeon for 6 weeks. The last time he was evaluated; he had CT imaging, and was treated with oral antibiotics. He has a chronic postoperative soft tissue infection and persistent open wound following mesh hernia repair, and lap banding procedure. He denies fever, chills, nausea, vomiting, diarrhea, dysuria. He has had a cough, productive of yellow sputum. He is taking his usual medicines, without relief. He is not currently on oral antibiotic. There are no other known modifying factors.   Past Medical History  Diagnosis Date  . Diabetes mellitus     Has Hx of diabetic foot ulcer & peripheral neuropathy  . Hyperlipidemia   . HTN (hypertension)   . Morbid obesity   . History of PFTs 05/2009    Mild Obstructive defect  . OSA on CPAP   . Anemia   . Colon cancer 2007    s/p sigmoid colectomy  . CHF (congestive heart failure)     Presumed diastolic. Echo (06/07) w.EF 45%, severe posterior HK, mild LV hypertrophy, No further work-up of abnormal echo was done.   Past Surgical History  Procedure Laterality Date  . Sigmoid colectomy  10/2005    [facility]  . Corneal transplant  [facility]    '80/Right  '84/Left  . Bariatric surgery  12/2009    Lap Band/At [facility]  . Hip surgery      bilateral   . Colonoscopy  multiple  . Tonsillectomy    . Colonoscopy with propofol N/A 11/26/2012    Procedure: COLONOSCOPY WITH PROPOFOL;  Surgeon: Gatha Mayer, MD;  Location: [facility];  Service: Endoscopy;  Laterality: N/A;  may need pre appt. with anesthesia due to morbid obesity  . Incisional hernia repair N/A 12/18/2013    Procedure:  REPAIR OF INCARCERATED INCISIONAL HERNIA;  Surgeon: Rolm Bookbinder, MD;  Location: [facility];  Service: General;  Laterality: N/A;  . Insertion of mesh N/A 12/18/2013    Procedure: INSERTION OF MESH;  Surgeon: Rolm Bookbinder, MD;  Location: [facility] OR;  Service: General;  Laterality: N/A;   Family History  Problem Relation Age of Onset  . Hepatitis Mother     hepatitis C  . Heart attack Brother 79  . Cancer Neg Hx     colon or prostate  . Diabetes Mother   . Diabetes Father    History  Substance Use Topics  . Smoking status: Never Smoker   . Smokeless tobacco: Never Used  . Alcohol Use: No     Comment: Only Occasional.    Review of Systems  Gastrointestinal: Positive for abdominal pain.  All other systems reviewed and are negative.     Allergies  Shellfish allergy and Lisinopril  Home Medications   Prior to Admission medications   Medication Sig Start Date End Date Taking? Authorizing  Provider  aspirin EC 325 MG tablet Take 325 mg by mouth every evening.    Yes Historical Provider, MD  bisoprolol (ZEBETA) 10 MG tablet Take 10 mg by mouth every evening.   Yes Historical Provider, MD  ferrous sulfate 325 (65 FE) MG tablet Take 325 mg by mouth 2 (two) times daily with a meal.   Yes Historical Provider, MD  furosemide (LASIX) 40 MG tablet Take 40 mg by mouth 2 (two) times daily.   Yes Historical Provider, MD  HYDROcodone-acetaminophen (NORCO) 10-325 MG per tablet Take 1 tablet by mouth every 6 (six) hours as needed for moderate pain or severe pain.   Yes Historical Provider, MD  insulin detemir (LEVEMIR) 100 UNIT/ML injection Inject 80 Units into  the skin 2 (two) times daily.   Yes Historical Provider, MD  levothyroxine (SYNTHROID, LEVOTHROID) 50 MCG tablet Take 50 mcg by mouth daily before breakfast.   Yes Historical Provider, MD  polyethylene glycol powder (GLYCOLAX/MIRALAX) powder Take 17 g by mouth daily as needed for mild constipation.  02/27/12  Yes Neena Rhymes, MD  simvastatin (ZOCOR) 40 MG tablet Take 40 mg by mouth every evening.   Yes Historical Provider, MD  traMADol (ULTRAM) 50 MG tablet Take 50 mg by mouth 3 (three) times daily as needed for moderate pain.  02/03/14  Yes Historical Provider, MD  vitamin C (ASCORBIC ACID) 500 MG tablet Take 1,000 mg by mouth every morning.    Yes Historical Provider, MD  vitamin E 200 UNIT capsule Take 400 Units by mouth every morning.    Yes Historical Provider, MD  amoxicillin-clavulanate (AUGMENTIN) 875-125 MG per tablet Take 1 tablet by mouth 2 (two) times daily. 04/15/14   Emina Riebock, NP   BP 132/72  Pulse 76  Temp(Src) 97.9 F (36.6 C) (Oral)  Resp 18  SpO2 95% Physical Exam  Nursing note and vitals reviewed. Constitutional: He is oriented to person, place, and time. He appears well-developed.  Obese  HENT:  Head: Normocephalic and atraumatic.  Right Ear: External ear normal.  Left Ear: External ear normal.  Eyes: Conjunctivae and EOM are normal. Pupils are equal, round, and reactive to light.  Neck: Normal range of motion and phonation normal. Neck supple.  Cardiovascular: Normal rate, regular rhythm, normal heart sounds and intact distal pulses.   Pulmonary/Chest: Effort normal and breath sounds normal. He exhibits no bony tenderness.  Abdominal: Soft. There is tenderness (Tender left-sided periumbilical mass with associated draining wound, containing quarter inch iodoform packing material. There is an area of induration and fluctuance measuring approximately 4 x 5 cm. The indurated area is tender to palpation. ). There is no rebound and no guarding.  Musculoskeletal:  Normal range of motion.  Neurological: He is alert and oriented to person, place, and time. No cranial nerve deficit or sensory deficit. He exhibits normal muscle tone. Coordination normal.  Skin: Skin is warm, dry and intact.  Psychiatric: He has a normal mood and affect. His behavior is normal. Judgment and thought content normal.    ED Course  Procedures (including critical care time) Medications - No data to display  Patient Vitals for the past 24 hrs:  BP Temp Temp src Pulse Resp SpO2  04/15/14 0846 132/72 mmHg 97.9 F (36.6 C) Oral 76 18 95 %   9:44 AM-Consult complete with gen. surg.. Patient case explained and discussed. She agrees to admit patient for further evaluation and treatment. Call ended at [place] Reviewed  CBC WITH DIFFERENTIAL - Abnormal; Notable for the following:    RBC 3.59 (*)    Hemoglobin 9.5 (*)    HCT 30.3 (*)    RDW 17.2 (*)    All other components within normal limits  BASIC METABOLIC PANEL - Abnormal; Notable for the following:    BUN 31 (*)    GFR calc non Af Amer 58 (*)    GFR calc Af Amer 68 (*)    All other components within normal limits    Imaging Review No results found.   EKG Interpretation None      MDM   Final diagnoses:  Wound infection, subsequent encounter   Chronic wound with drainage, and exam consistent with abscess. Complicated pre-surgical history. Assessment by Gen. surgery was requested. Evaluated by general surgery, and my recommendation was continued. Wound care with intermittent packing, and initiation of antibiotic treatment, with Augmentin. I will see the patient in the office for further care and treatment. He is to continue his in-home nursing care program.  Nursing Notes Reviewed/ Care Coordinated Applicable Imaging Reviewed Interpretation of Laboratory Data incorporated into ED treatment  The patient appears reasonably screened and/or stabilized for discharge and I doubt any other medical  condition or other [facility] requiring further screening, evaluation, or treatment in the ED at this time prior to discharge.  Plan: Home Medications- Augmentin; Home Treatments- wound care at home; return here if the recommended treatment, does not improve the symptoms; Recommended follow up- PCP prn. Gen. Surg. 1 week  Richarda Blade, MD 04/15/14 [phone]

## 2014-04-15 NOTE — Consult Note (Signed)
Would treat with 1 week antibiotic for increased drainage.    Would not give pain meds.

## 2014-04-15 NOTE — Consult Note (Signed)
Reason for Consult:abdominal wound Referring Physician: Dr. Daleen Bo   HPI: The patient presents to [facility] with pain and increased drainage.  He is status post incarcerated incisional hernia repair 12/18/13 by Dr. Donne Hazel.  He has been followed in the office for a open wound which was small.  A CT scan of abdomen on 03/03/14 was negative for an abscess or recurrence of hernia.  Unfortunately, [name] lost follow up with Dr. Donne Hazel and has not been since since then.  He reports a stitch came out on Sunday.  He has been doing daily dressing change packing 1/4 inch iodoform by nursing.  He denies fever or chills.  He has normal bowel movements.  Appetite is excellent.  His white count today is 6.7.  His vitals signs are stable and he remains afebrile.   Past Medical History  Diagnosis Date  . Diabetes mellitus     Has Hx of diabetic foot ulcer & peripheral neuropathy  . Hyperlipidemia   . HTN (hypertension)   . Morbid obesity   . History of PFTs 05/2009    Mild Obstructive defect  . OSA on CPAP   . Anemia   . Colon cancer 2007    s/p sigmoid colectomy  . CHF (congestive heart failure)     Presumed diastolic. Echo (06/07) w.EF 45%, severe posterior HK, mild LV hypertrophy, No further work-up of abnormal echo was done.    Past Surgical History  Procedure Laterality Date  . Sigmoid colectomy  10/2005    [facility]  . Corneal transplant  [facility]    '80/Right  '84/Left  . Bariatric surgery  12/2009    Lap Band/At [facility]  . Hip surgery      bilateral  . Colonoscopy  multiple  . Tonsillectomy    . Colonoscopy with propofol N/A 11/26/2012    Procedure: COLONOSCOPY WITH PROPOFOL;  Surgeon: Gatha Mayer, MD;  Location: [facility];  Service: Endoscopy;  Laterality: N/A;  may need pre appt. with anesthesia due to morbid obesity  . Incisional hernia repair N/A 12/18/2013    Procedure:  REPAIR OF INCARCERATED INCISIONAL HERNIA;  Surgeon: Rolm Bookbinder, MD;  Location: [facility];  Service:  General;  Laterality: N/A;  . Insertion of mesh N/A 12/18/2013    Procedure: INSERTION OF MESH;  Surgeon: Rolm Bookbinder, MD;  Location: [facility] OR;  Service: General;  Laterality: N/A;    Family History  Problem Relation Age of Onset  . Hepatitis Mother     hepatitis C  . Heart attack Brother 77  . Cancer Neg Hx     colon or prostate  . Diabetes Mother   . Diabetes Father     Social History:  reports that he has never smoked. He has never used smokeless tobacco. He reports that he does not drink alcohol or use illicit drugs.  Allergies:  Allergies  Allergen Reactions  . Shellfish Allergy Anaphylaxis  . Lisinopril Cough    Medications: Scheduled Meds: Continuous Infusions: PRN Meds:.    Results for orders placed during the hospital encounter of 04/15/14 (from the past 48 hour(s))  CBC WITH DIFFERENTIAL     Status: Abnormal   Collection Time    04/15/14 10:07 AM      Result Value Ref Range   WBC 6.7  4.0 - 10.5 K/uL   RBC 3.59 (*) 4.22 - 5.81 MIL/uL   Hemoglobin 9.5 (*) 13.0 - 17.0 g/dL   HCT 30.3 (*) 39.0 - 52.0 %  MCV 84.4  78.0 - 100.0 fL   MCH 26.5  26.0 - 34.0 pg   MCHC 31.4  30.0 - 36.0 g/dL   RDW 17.2 (*) 11.5 - 15.5 %   Platelets 251  150 - 400 K/uL   Neutrophils Relative % 63  43 - 77 %   Neutro Abs 4.2  1.7 - 7.7 K/uL   Lymphocytes Relative 24  12 - 46 %   Lymphs Abs 1.6  0.7 - 4.0 K/uL   Monocytes Relative 8  3 - 12 %   Monocytes Absolute 0.5  0.1 - 1.0 K/uL   Eosinophils Relative 5  0 - 5 %   Eosinophils Absolute 0.3  0.0 - 0.7 K/uL   Basophils Relative 0  0 - 1 %   Basophils Absolute 0.0  0.0 - 0.1 K/uL  BASIC METABOLIC PANEL     Status: Abnormal   Collection Time    04/15/14 10:07 AM      Result Value Ref Range   Sodium 142  137 - 147 mEq/L   Potassium 4.4  3.7 - 5.3 mEq/L   Chloride 104  96 - 112 mEq/L   CO2 26  19 - 32 mEq/L   Glucose, Bld 99  70 - 99 mg/dL   BUN 31 (*) 6 - 23 mg/dL   Creatinine, Ser 1.33  0.50 - 1.35 mg/dL   Calcium 9.3   8.4 - 10.5 mg/dL   GFR calc non Af Amer 58 (*) >90 mL/min   GFR calc Af Amer 68 (*) >90 mL/min   Comment: (NOTE)     The eGFR has been calculated using the CKD EPI equation.     This calculation has not been validated in all clinical situations.     eGFR's persistently <90 mL/min signify possible Chronic Kidney     Disease.   Anion gap 12  5 - 15    No results found.  Review of Systems  All other systems reviewed and are negative.  Blood pressure 132/72, pulse 76, temperature 97.9 F (36.6 C), temperature source Oral, resp. rate 18, SpO2 95.00%. Physical Exam  Constitutional: He is oriented to person, place, and time. He appears well-developed and well-nourished. No distress.  Cardiovascular: Normal rate, regular rhythm, normal heart sounds and intact distal pulses.  Exam reveals no gallop and no friction rub.   No murmur heard. Respiratory: Effort normal and breath sounds normal. No respiratory distress. He has no wheezes. He has no rales. He exhibits no tenderness.  GI: Soft. Bowel sounds are normal. He exhibits no distension and no mass. There is no tenderness. There is no rebound and no guarding.  .5cm opening above the umbilicus which is 9cm deep, there is no tracking and no apparent abscesses.  Packing was removed which had some purulent drainage.  No malodor and no drainage was expressed after removal.   Musculoskeletal: Normal range of motion. He exhibits no edema.  Neurological: He is alert and oriented to person, place, and time.  Skin: Skin is warm. He is not diaphoretic.  Psychiatric: He has a normal mood and affect. His behavior is normal. Judgment and thought content normal.    Assessment/Plan: Post operative abdominal wall wound:  There does not appear to be any residual abscesses.  Im not concerned about a fistula.  A CT in June did not reveal a fistula.  This wound will take a while to heal.  Will increase his dressing changes to TID.  Change 1/4  inch iodoform to 1/4  inch plain packing.  Give augmentin x7 days.  Follow up with Dr. Donne Hazel scheduled on 04/25/14 at 11:10AM  [name], [facility] ANP-BC 04/15/2014, 10:48 AM

## 2014-04-15 NOTE — ED Notes (Signed)
Pt c/o abd pain right around umbilicus, had hernia repair April 1and a lap band 4 years ago.. Pt c/o nausea but no vomiting

## 2014-04-15 NOTE — ED Notes (Signed)
Surgery asked for wound to be repacked. Went to pack patient's abdominal wound and patient was not in the room. Found patient in the lobby walking with a walker and kerlex dragging from the abdominal wound on the floor. Patient informed that the surgery team wanted his wound repacked and dressed. Writer asked patient to back into the ED to have his wound dressed. Patient stated "No and yelled out, "I want to go to the bathroom." Again patient was asked to come back to his room to have the wound dressed, but patient refused and proceeded to the bathroom. Charge nurse notified.

## 2014-04-16 DIAGNOSIS — L02219 Cutaneous abscess of trunk, unspecified: Secondary | ICD-10-CM | POA: Diagnosis not present

## 2014-04-16 DIAGNOSIS — L97509 Non-pressure chronic ulcer of other part of unspecified foot with unspecified severity: Secondary | ICD-10-CM | POA: Diagnosis not present

## 2014-04-16 DIAGNOSIS — E1169 Type 2 diabetes mellitus with other specified complication: Secondary | ICD-10-CM | POA: Diagnosis not present

## 2014-04-16 DIAGNOSIS — T8140XA Infection following a procedure, unspecified, initial encounter: Secondary | ICD-10-CM | POA: Diagnosis not present

## 2014-04-22 ENCOUNTER — Telehealth: Payer: Self-pay | Admitting: Internal Medicine

## 2014-04-22 DIAGNOSIS — E669 Obesity, unspecified: Secondary | ICD-10-CM

## 2014-04-22 DIAGNOSIS — E1149 Type 2 diabetes mellitus with other diabetic neurological complication: Secondary | ICD-10-CM

## 2014-04-22 DIAGNOSIS — I5032 Chronic diastolic (congestive) heart failure: Secondary | ICD-10-CM

## 2014-04-22 NOTE — Telephone Encounter (Signed)
Pt request phone call from the assistant. Pt stated that they do not have the fax that we fax over for wheel chair on 04/14/14. Please refax and call pt back. If we need more info about this order please call [phone].

## 2014-04-23 ENCOUNTER — Telehealth: Payer: Self-pay | Admitting: Internal Medicine

## 2014-04-23 ENCOUNTER — Encounter (HOSPITAL_BASED_OUTPATIENT_CLINIC_OR_DEPARTMENT_OTHER): Payer: Medicare HMO | Attending: General Surgery

## 2014-04-23 DIAGNOSIS — E1169 Type 2 diabetes mellitus with other specified complication: Secondary | ICD-10-CM | POA: Insufficient documentation

## 2014-04-23 DIAGNOSIS — T8189XA Other complications of procedures, not elsewhere classified, initial encounter: Secondary | ICD-10-CM | POA: Diagnosis not present

## 2014-04-23 DIAGNOSIS — Y838 Other surgical procedures as the cause of abnormal reaction of the patient, or of later complication, without mention of misadventure at the time of the procedure: Secondary | ICD-10-CM | POA: Diagnosis not present

## 2014-04-23 DIAGNOSIS — Z9889 Other specified postprocedural states: Secondary | ICD-10-CM | POA: Diagnosis not present

## 2014-04-23 DIAGNOSIS — M25551 Pain in right hip: Secondary | ICD-10-CM

## 2014-04-23 DIAGNOSIS — E669 Obesity, unspecified: Secondary | ICD-10-CM

## 2014-04-23 DIAGNOSIS — L97409 Non-pressure chronic ulcer of unspecified heel and midfoot with unspecified severity: Secondary | ICD-10-CM | POA: Diagnosis not present

## 2014-04-23 DIAGNOSIS — E1149 Type 2 diabetes mellitus with other diabetic neurological complication: Secondary | ICD-10-CM

## 2014-04-23 NOTE — Telephone Encounter (Signed)
[name] w/ [employer] [facility] [employer] called stating that she has received the wheelchair orders but will also need orders for PT/OT eval & tx. Order created and faxed to number listed in previous phone note.

## 2014-04-23 NOTE — Telephone Encounter (Signed)
Order faxed to [phone] (attn:Heather) per pt request.

## 2014-04-23 NOTE — Telephone Encounter (Signed)
Fax number is [phone]

## 2014-04-25 ENCOUNTER — Ambulatory Visit (INDEPENDENT_AMBULATORY_CARE_PROVIDER_SITE_OTHER): Payer: Commercial Managed Care - HMO | Admitting: General Surgery

## 2014-04-25 ENCOUNTER — Encounter (INDEPENDENT_AMBULATORY_CARE_PROVIDER_SITE_OTHER): Payer: Self-pay | Admitting: General Surgery

## 2014-04-25 VITALS — BP 132/88 | HR 70 | Temp 97.8°F | Ht 72.0 in | Wt >= 6400 oz

## 2014-04-25 DIAGNOSIS — Z8719 Personal history of other diseases of the digestive system: Secondary | ICD-10-CM

## 2014-04-25 DIAGNOSIS — Z9889 Other specified postprocedural states: Secondary | ICD-10-CM

## 2014-04-25 MED ORDER — AMOXICILLIN-POT CLAVULANATE 875-125 MG PO TABS: 1.0000 | ORAL_TABLET | Freq: Two times a day (BID) | ORAL | Status: DC

## 2014-04-26 NOTE — Progress Notes (Signed)
Subjective:     Patient ID: Caiman Pinto, male   DOB: 1956-01-25, 58 y.o.   MRN: [id number]  HPI  58 yom s/p preperitoneal repair of incarcerated vh with bowel obstruction using mesh. I opened a superficial abscess in his pannus previously. This has been draining still. He has been on abx intermittently. He has missed an appt with me and was seen in er.  His drainage gets much better on abx then returns to cloudy after that.  He is packing this daily. He has no fevers. He has no abdominal pain. He is eating well and having bowel movements.  Review of Systems     Objective:   Physical Exam Wound healed eexcept for 2 cm opening that tracks to about 4 cm, there is some purulence present, packed again today, no erythema    Assessment:     S/p vh repair  Wound infection     Plan:     He is going to continue dressing changes for now. This area is difficult to heal for variety of reasons. I talked to him about operative debridement of this area and healing by secondary intention. I think he will be able to keep mesh but I am concerned about infection of his mesh as well as his lap band port given both of their proximity to this infection but I think they're separated there is no evidence of infection  right now. We're just going to keep him on antibiotics for a prolonged period he is going to continue dressing changes and I will see him back in 3 weeks. If not better then we will discuss surgery. If worsens I told him he needs to call me

## 2014-04-29 ENCOUNTER — Ambulatory Visit (INDEPENDENT_AMBULATORY_CARE_PROVIDER_SITE_OTHER): Payer: Commercial Managed Care - HMO | Admitting: Internal Medicine

## 2014-04-29 ENCOUNTER — Telehealth (INDEPENDENT_AMBULATORY_CARE_PROVIDER_SITE_OTHER): Payer: Self-pay | Admitting: *Deleted

## 2014-04-29 ENCOUNTER — Encounter: Payer: Self-pay | Admitting: Internal Medicine

## 2014-04-29 VITALS — BP 138/78 | HR 84 | Temp 98.1°F | Resp 16 | Ht 72.0 in | Wt >= 6400 oz

## 2014-04-29 DIAGNOSIS — E669 Obesity, unspecified: Secondary | ICD-10-CM

## 2014-04-29 DIAGNOSIS — M1611 Unilateral primary osteoarthritis, right hip: Secondary | ICD-10-CM

## 2014-04-29 DIAGNOSIS — M1610 Unilateral primary osteoarthritis, unspecified hip: Secondary | ICD-10-CM

## 2014-04-29 DIAGNOSIS — I428 Other cardiomyopathies: Secondary | ICD-10-CM

## 2014-04-29 DIAGNOSIS — E109 Type 1 diabetes mellitus without complications: Secondary | ICD-10-CM

## 2014-04-29 DIAGNOSIS — E039 Hypothyroidism, unspecified: Secondary | ICD-10-CM

## 2014-04-29 DIAGNOSIS — I10 Essential (primary) hypertension: Secondary | ICD-10-CM

## 2014-04-29 DIAGNOSIS — E785 Hyperlipidemia, unspecified: Secondary | ICD-10-CM

## 2014-04-29 NOTE — Patient Instructions (Signed)

## 2014-04-29 NOTE — Telephone Encounter (Signed)
Nicki Reaper, RN returned call to notify us that he no longers with [facility] that I would need to call them to see about sending another nurse to the pt's home.

## 2014-04-29 NOTE — Telephone Encounter (Signed)
Called [facility] spoke to [employer]. Who knows our pt very well b/c he calls everyday twice a day. Branka spoke to our pt yesterday about Nicki Reaper no longer going to be his home health nurse b/c he was leaving and the pt requested another male home health nurse. Branka with [facility] explained to pt that there were no male home health nurses available at this time that he would be getting a male home health nurse named Colletta Maryland to come today and Thursday. Colletta Maryland has been assigned for next week as well. The pt's services for home health orders are until 06/02/14. I advised that I will call the pt to explain to him what has been told to me and that he understood all of this information yesterday when speaking with [facility].

## 2014-04-29 NOTE — Telephone Encounter (Signed)
N/A when I tried to call pt back to go over below message with him. I want him to know that I spoke to [facility] which I know he also spoke to [facility] to get his new home health nurse Colletta Maryland [phone].

## 2014-04-29 NOTE — Progress Notes (Signed)
Subjective:    Patient ID: [name], male    DOB: Apr 09, 1956, 58 y.o.   MRN: [id number]  Diabetes He presents for his follow-up diabetic visit. He has type 2 diabetes mellitus. His disease course has been fluctuating. There are no hypoglycemic associated symptoms. Associated symptoms include polydipsia, polyphagia and polyuria. Pertinent negatives for diabetes include no blurred vision, no chest pain, no fatigue, no foot paresthesias, no foot ulcerations, no visual change, no weakness and no weight loss. There are no hypoglycemic complications. Symptoms are stable. Diabetic complications include heart disease, nephropathy and PVD. Current diabetic treatment includes insulin injections. He is compliant with treatment some of the time. His weight is stable. He is following a generally unhealthy diet. When asked about meal planning, he reported none. He has not had a previous visit with a dietician. He never participates in exercise. There is no change in his home blood glucose trend. An ACE inhibitor/angiotensin II receptor blocker is not being taken. He does not see a podiatrist.Eye exam is current.      Review of Systems  Constitutional: Negative.  Negative for fever, chills, weight loss, diaphoresis and fatigue.  HENT: Negative.   Eyes: Negative.  Negative for blurred vision.  Respiratory: Positive for shortness of breath. Negative for cough, choking, chest tightness and wheezing.   Cardiovascular: Negative.  Negative for chest pain, palpitations and leg swelling.  Gastrointestinal: Negative.  Negative for nausea, vomiting, abdominal pain, diarrhea, constipation and blood in stool.  Endocrine: Positive for polydipsia, polyphagia and polyuria.  Genitourinary: Negative.   Musculoskeletal: Positive for arthralgias (rt hip pain). Negative for back pain, joint swelling, myalgias and neck pain.  Skin: Negative.  Negative for rash.  Allergic/Immunologic: Negative.   Neurological: Negative.   Negative for weakness.  Hematological: Negative.  Negative for adenopathy. Does not bruise/bleed easily.  Psychiatric/Behavioral: Negative.        Objective:   Physical Exam  Vitals reviewed. Constitutional: He is oriented to person, place, and time. He appears well-developed and well-nourished. No distress.  HENT:  Head: Normocephalic and atraumatic.  Mouth/Throat: Oropharynx is clear and moist. No oropharyngeal exudate.  Eyes: Conjunctivae are normal. Right eye exhibits no discharge. Left eye exhibits no discharge. No scleral icterus.  Neck: Normal range of motion. Neck supple. No JVD present. No tracheal deviation present. No thyromegaly present.  Cardiovascular: Normal rate, regular rhythm, normal heart sounds and intact distal pulses.  Exam reveals no gallop and no friction rub.   No murmur heard. Pulmonary/Chest: Effort normal and breath sounds normal. No stridor. No respiratory distress. He has no rales. He exhibits no tenderness.  Abdominal: Soft. Bowel sounds are normal. He exhibits no distension and no mass. There is no tenderness. There is no rebound and no guarding.  Musculoskeletal: Normal range of motion. He exhibits edema (1+ pitting edema in BLE). He exhibits no tenderness.  Lymphadenopathy:    He has no cervical adenopathy.  Neurological: He is oriented to person, place, and time.  Skin: Skin is warm and dry. No rash noted. He is not diaphoretic. No erythema. No pallor.     Lab Results  Component Value Date   WBC 6.7 04/15/2014   HGB 9.5* 04/15/2014   HCT 30.3* 04/15/2014   PLT 251 04/15/2014   GLUCOSE 99 04/15/2014   CHOL 159 09/02/2013   TRIG 59.0 09/02/2013   HDL 38.40* 09/02/2013   LDLDIRECT 136.8 12/12/2006   LDLCALC 109* 09/02/2013   ALT 16 12/17/2013   AST 18 12/17/2013  NA 142 04/15/2014   K 4.4 04/15/2014   CL 104 04/15/2014   CREATININE 1.33 04/15/2014   BUN 31* 04/15/2014   CO2 26 04/15/2014   TSH 1.07 01/09/2014   PSA 0.71 01/11/2013   HGBA1C 8.9*  01/09/2014   MICROALBUR 4.7* 05/07/2013       Assessment & Plan:

## 2014-04-29 NOTE — Telephone Encounter (Signed)
I thought they were already coming to see him.

## 2014-04-29 NOTE — Telephone Encounter (Signed)
LMOM for Scott,RN with [facility] to call me so we can see about giving verbal on extending pt's home health with dressing changes. I asked for Nicki Reaper to call back so we can give verbal on keeping the dressing changes the same on Tue, and Thur. For the pt. The pt has f/u appt with Dr Donne Hazel on 8/27 so I gave orders lasting until 05/19/14 until we see pt again in the office.

## 2014-04-29 NOTE — Progress Notes (Signed)
Pre visit review using our clinic review tool, if applicable. No additional management support is needed unless otherwise documented below in the visit note. 

## 2014-04-29 NOTE — Telephone Encounter (Signed)
Pt called stating that we needed to get home health nurse our there for wound dressing changes on Tuesday and Thursdays.  I explained to pt that I would have to send a note to Dr. Donne Hazel and his assistant, that I didn't see any documentation regarding this on his last visit.  Pt stated that he called yesterday and spoke to a nurse and already explained hisself.  I advised the pt that I didn't see any documentation.  Pt was very addiment that he called and then he got very verbally abusive, asking what kind of place are we running around here?  Stating, that I was being nasty by telling him that I didn't speak with him, and I did apologize for that, but I would check into it and see what we could do to sort this out.  Pt continued with the verbal rudeness and hung up on me.  I'm not sure what the pt is talking about.  If he called here yesterday, noone documented in his chart. I'm not sure if someone sent a in-basket message or not, but I'm just sending this to Dr. Donne Hazel to see if [place] has been put in for this pt?  I'm just trying to find out and see what I can do to solve this.  Please advise!  Anderson Malta

## 2014-04-30 ENCOUNTER — Encounter: Payer: Self-pay | Admitting: Internal Medicine

## 2014-04-30 ENCOUNTER — Other Ambulatory Visit (INDEPENDENT_AMBULATORY_CARE_PROVIDER_SITE_OTHER): Payer: Commercial Managed Care - HMO

## 2014-04-30 DIAGNOSIS — E785 Hyperlipidemia, unspecified: Secondary | ICD-10-CM

## 2014-04-30 DIAGNOSIS — T8189XA Other complications of procedures, not elsewhere classified, initial encounter: Secondary | ICD-10-CM | POA: Diagnosis not present

## 2014-04-30 DIAGNOSIS — E1169 Type 2 diabetes mellitus with other specified complication: Secondary | ICD-10-CM | POA: Diagnosis not present

## 2014-04-30 DIAGNOSIS — Z9889 Other specified postprocedural states: Secondary | ICD-10-CM | POA: Diagnosis not present

## 2014-04-30 DIAGNOSIS — E039 Hypothyroidism, unspecified: Secondary | ICD-10-CM

## 2014-04-30 DIAGNOSIS — E109 Type 1 diabetes mellitus without complications: Secondary | ICD-10-CM

## 2014-04-30 DIAGNOSIS — L97409 Non-pressure chronic ulcer of unspecified heel and midfoot with unspecified severity: Secondary | ICD-10-CM | POA: Diagnosis not present

## 2014-04-30 LAB — LIPID PANEL
Cholesterol: 188 mg/dL (ref 0–200)
HDL: 36.6 mg/dL — ABNORMAL LOW (ref >39.00)
LDL CALC: 136 mg/dL — AB (ref 0–99)
NonHDL: 151.4
Total CHOL/HDL Ratio: 5
Triglycerides: 79 mg/dL (ref 0.0–149.0)
VLDL: 15.8 mg/dL (ref 0.0–40.0)

## 2014-04-30 LAB — HEMOGLOBIN A1C: HEMOGLOBIN A1C: 8.5 % — AB (ref 4.6–6.5)

## 2014-04-30 LAB — TSH: TSH: 1.34 u[IU]/mL (ref 0.35–4.50)

## 2014-04-30 MED ORDER — EZETIMIBE 10 MG PO TABS: 10.0000 mg | ORAL_TABLET | Freq: Every day | ORAL | Status: DC

## 2014-04-30 NOTE — Assessment & Plan Note (Signed)
I have referred him to ortho again

## 2014-04-30 NOTE — Addendum Note (Signed)
Addended by: [name] on: 04/30/2014 12:13 PM   Modules accepted: Orders

## 2014-04-30 NOTE — Assessment & Plan Note (Addendum)
He has not achieved his LDL goal, will recheck the [place] today  LDL is still high, I have asked him to add zetia

## 2014-04-30 NOTE — Assessment & Plan Note (Signed)
His blood sugars have not been well controlled I have asked him to f/up with ENDO

## 2014-04-30 NOTE — Assessment & Plan Note (Signed)
His TSH is on the normal range, he will stay on the current dose

## 2014-05-07 DIAGNOSIS — L97409 Non-pressure chronic ulcer of unspecified heel and midfoot with unspecified severity: Secondary | ICD-10-CM | POA: Diagnosis not present

## 2014-05-07 DIAGNOSIS — E1169 Type 2 diabetes mellitus with other specified complication: Secondary | ICD-10-CM | POA: Diagnosis not present

## 2014-05-07 DIAGNOSIS — T8189XA Other complications of procedures, not elsewhere classified, initial encounter: Secondary | ICD-10-CM | POA: Diagnosis not present

## 2014-05-07 DIAGNOSIS — Z9889 Other specified postprocedural states: Secondary | ICD-10-CM | POA: Diagnosis not present

## 2014-05-08 ENCOUNTER — Telehealth: Payer: Self-pay | Admitting: Internal Medicine

## 2014-05-08 NOTE — Telephone Encounter (Signed)
Patient has called several times regarding referral for PT/OT so that he may get his wheelchair. Per previous telephone notes (04/22/14 and 04/23/14) we have sent in all requirements. I spoke w/Dianna @ [facility] to find out what else is needed in order to get wheelchair for this patient. She states they have all documentation they need, but the delay is due to getting pt scheduled @ [facility] for evaluation. She states they are working on it and will call us if there is anything we can do to expedite this process.

## 2014-05-14 DIAGNOSIS — T8189XA Other complications of procedures, not elsewhere classified, initial encounter: Secondary | ICD-10-CM | POA: Diagnosis not present

## 2014-05-14 DIAGNOSIS — E1169 Type 2 diabetes mellitus with other specified complication: Secondary | ICD-10-CM | POA: Diagnosis not present

## 2014-05-14 DIAGNOSIS — L97409 Non-pressure chronic ulcer of unspecified heel and midfoot with unspecified severity: Secondary | ICD-10-CM | POA: Diagnosis not present

## 2014-05-14 DIAGNOSIS — Z9889 Other specified postprocedural states: Secondary | ICD-10-CM | POA: Diagnosis not present

## 2014-05-15 ENCOUNTER — Encounter (INDEPENDENT_AMBULATORY_CARE_PROVIDER_SITE_OTHER): Payer: Self-pay | Admitting: General Surgery

## 2014-05-15 ENCOUNTER — Ambulatory Visit (INDEPENDENT_AMBULATORY_CARE_PROVIDER_SITE_OTHER): Payer: Commercial Managed Care - HMO | Admitting: General Surgery

## 2014-05-15 ENCOUNTER — Telehealth: Payer: Self-pay | Admitting: Internal Medicine

## 2014-05-15 VITALS — BP 136/78 | HR 63 | Temp 97.5°F | Ht 72.0 in | Wt >= 6400 oz

## 2014-05-15 DIAGNOSIS — Z5189 Encounter for other specified aftercare: Secondary | ICD-10-CM

## 2014-05-15 DIAGNOSIS — S31109D Unspecified open wound of abdominal wall, unspecified quadrant without penetration into peritoneal cavity, subsequent encounter: Secondary | ICD-10-CM

## 2014-05-15 DIAGNOSIS — S31109A Unspecified open wound of abdominal wall, unspecified quadrant without penetration into peritoneal cavity, initial encounter: Secondary | ICD-10-CM

## 2014-05-15 MED ORDER — AMOXICILLIN-POT CLAVULANATE 875-125 MG PO TABS: 1.0000 | ORAL_TABLET | Freq: Two times a day (BID) | ORAL | Status: DC

## 2014-05-15 NOTE — Progress Notes (Signed)
Subjective:     Patient ID: [name], male   DOB: Aug 31, 1956, 58 y.o.   MRN: [id number]  HPI 73 yom s/p preperitoneal repair of incarcerated vh with bowel obstruction using mesh. I opened a superficial abscess in his pannus previously. This has been draining still. I have had him on abx for a couple weeks. He states he has had some dark drainage but today it is serous.  He had a fever recently.  He is having bowel movements, doesn't really have any pain, tolerating diet.    Review of Systems     Objective:   Physical Exam Wound healed except for 2 cm opening that tracks to about 4 cm that is really unchanged, no purulence today, no erythema    Assessment:     S/p vh repair  Wound infection     Plan:     He is going to continue dressing changes for now. This area is difficult to heal for variety of reasons. I talked to him about operative debridement of this area and healing by secondary intention. I do think that this needs to be debrided if it is going to heal at this point.  I think he will be able to keep mesh but I am concerned about infection of his mesh as well as his lap band port given both of their proximity to this infection but I think they're separated there is no evidence of infection right now. We're just going to keep him on antibiotics for a prolonged period he is going to continue dressing changes and I will see him back in 3 weeks. He would like to get a second opinion and I told him I would be happy to set this up either in my practice or somewhere else.

## 2014-05-15 NOTE — Telephone Encounter (Signed)
error 

## 2014-05-16 ENCOUNTER — Ambulatory Visit (INDEPENDENT_AMBULATORY_CARE_PROVIDER_SITE_OTHER): Payer: Commercial Managed Care - HMO | Admitting: Cardiology

## 2014-05-16 ENCOUNTER — Encounter: Payer: Self-pay | Admitting: Cardiology

## 2014-05-16 VITALS — BP 140/56 | HR 68 | Ht 72.0 in | Wt >= 6400 oz

## 2014-05-16 DIAGNOSIS — E669 Obesity, unspecified: Secondary | ICD-10-CM

## 2014-05-16 DIAGNOSIS — I509 Heart failure, unspecified: Secondary | ICD-10-CM

## 2014-05-16 DIAGNOSIS — I10 Essential (primary) hypertension: Secondary | ICD-10-CM

## 2014-05-16 DIAGNOSIS — I5032 Chronic diastolic (congestive) heart failure: Secondary | ICD-10-CM

## 2014-05-16 MED ORDER — ATORVASTATIN CALCIUM 40 MG PO TABS: 40.0000 mg | ORAL_TABLET | Freq: Every day | ORAL | Status: DC

## 2014-05-16 MED ORDER — LOSARTAN POTASSIUM 25 MG PO TABS: 25.0000 mg | ORAL_TABLET | Freq: Every day | ORAL | Status: DC

## 2014-05-16 NOTE — Patient Instructions (Signed)
Stop simvastatin (zocor).   Start atorvastatin 40mg  daily.  Start losartan 25mg  daily.   Your physician recommends that you return for lab work in: 2 weeks--BMET.  Your physician recommends that you return for a FASTING lipid profile /liver profile in 2 months.   Your physician recommends that you schedule a follow-up appointment in: 3 months with Dr Aundra Dubin.

## 2014-05-18 NOTE — Progress Notes (Signed)
Patient ID: [name], male   DOB: [birth year], 58 y.o.   MRN: [id number] PCP: Dr. Linda Hedges  58 yo with history of morbid obesity, diabetes, diastolic CHF presents for cardiology followup.  He has not lost weight.  He has limited mobility due to size and uses a rolling walker.  Since my last visit with him, he had a ventral hernia repair that was complicated by post-op infection.  He is still having issues with this and follows with general surgery.  He has stable dyspnea with walking more than 30-40 feet. He is taking Lasix 80 mg once a day.  His legs are weak.  No falls.  He has "pin-prick" type atypical chest pain lasting for seconds and not related to exertion.  He stopped lisinopril due to cough and did not start anything to replace it.  BP is running high.     Labs (8/10): creatinine 1.1, LDL 94, HDL 34 Labs (8/11): K 4.8, creatinine 1.2, LDL 93, HDL 33 Labs (10/12): K 5.4, creatinine 1.5, LDL 95, HDL 42 Labs (8/14): LDL 115, HDL 42 Labs (9/14): BNP 68 Labs (10/14): K 4.5, creatinine 1.6 Labs (7/15): K 4.4, creatinine 1.3 Labs (8/15): LDL 136, HDL 37  ECG:  NSR, normal  Allergies (verified):  1)  ! * Shellfish 2)  * Shellfish  Past Medical History: 1. Diabetes mellitus.  Has history of diabetic foot ulcer and diabetic peripheral neuropathy. .  2. Hyperlipidemia 3. Hypertension: ACEI cough.  4. Colon cancer: s/p sigmoid colectomy in 2007 5. Morbid obesity s/p lap band procedure.  6. Anemia 7. Obstructive Sleep Apnea: Uses CPAP 8. CHF: Diastolic.  Echo (5/12): EF 50-55%, mild LV dilation, mild LVH, moderate (grade II) diastolic dysfunction.  Echo (9/14) with EF 50-55%, mild LVH, moderately dilated LV, RV poorly visualized.  9. PFTs (9/10): mild obstructive defect 10.  Dobutamine stress echo (9/10): EF 55%, mild LV hypertrophy, no significant valvular disease, no ischemia or infarction  11. CKD 12. Ventral hernia s/p repair.   Family History: father- deceased @68 :  Asbestosis mother - deceased @ 36: CHF, CAD, Hepatitis C Neg- colon or prostate cancer. Brother - died of heart failure and had MI at 34  Social History: HSG, 2 years of college [employer] [place]. 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 In new relationship (04/2009)...girlfriend helps taste for cooking business and helps monitor for ulcers.   Never Smoked Alcohol use-occasional.   Drug use-no Regular exercise-no  ROS: All systems reviewed and negative except as per HPI.    Current Outpatient Prescriptions  Medication Sig Dispense Refill  . amoxicillin-clavulanate (AUGMENTIN) 875-125 MG per tablet Take 1 tablet by mouth 2 (two) times daily.  30 tablet  0  . aspirin EC 325 MG tablet Take 325 mg by mouth every evening.       . bisoprolol (ZEBETA) 10 MG tablet Take 10 mg by mouth every evening.      . ferrous sulfate 325 (65 FE) MG tablet Take 325 mg by mouth 2 (two) times daily with a meal.      . insulin detemir (LEVEMIR) 100 UNIT/ML injection Inject 80 Units into the skin 2 (two) times daily.      Marland Kitchen levothyroxine (SYNTHROID, LEVOTHROID) 50 MCG tablet Take 50 mcg by mouth daily before breakfast.      . polyethylene glycol powder (GLYCOLAX/MIRALAX) powder Take  17 g by mouth daily as needed for mild constipation.       . vitamin C (ASCORBIC ACID) 500 MG tablet Take 1,000 mg by mouth every morning.       . vitamin E 200 UNIT capsule Take 400 Units by mouth every morning.       Marland Kitchen atorvastatin (LIPITOR) 40 MG tablet Take 1 tablet (40 mg total) by mouth daily.  30 tablet  3  . furosemide (LASIX) 40 MG tablet Take 40 mg by mouth 2 (two) times daily.      Marland Kitchen losartan (COZAAR) 25 MG tablet Take 1 tablet (25 mg total) by mouth daily.  30 tablet  4   No current facility-administered medications for this visit.    BP 140/56  Pulse 68  Ht 6' (1.829 m)  Wt  202.758 kg (447 lb)  BMI 60.61 kg/m2  SpO2 97% General: NAD, morbidly obese.  Neck: Thick, No JVD, no thyromegaly or thyroid nodule.  Lungs: Clear to auscultation bilaterally with normal respiratory effort. CV: Nondisplaced PMI.  Heart irregular S1/S2, no S3/S4, 1/6 early SEM.  1+ ankle edema.  No carotid bruit.  Normal pedal pulses.  Abdomen: Soft, nontender, no hepatosplenomegaly, no distention.  Neurologic: Alert and oriented x 3.  Psych: Normal affect. Extremities: No clubbing or cyanosis.   Assessment/Plan: 1. Chronic diastolic CHF: Exam is difficult for volume, but he does not appear markedly volume overloaded.  Continue Lasix 80 mg daily.  2. Obesity: Weight loss is imperative but will be difficult given his limited mobility.  3. HTN: BP is high.  He is off ACEI due to cough.  He has diabetic nephropathy.  I will have him start losartan 25 mg daily.  Given CKD, follow creatinine closely.  Will check BMET in 2 wks.  4. Hyperlipidemia: Recent LDL was elevated.  It was recommended that he add Zetia to simvastatin. This was going to be very expensive and he does not want to take it.  He can instead stop simvastatin and take atorvastatin 40 mg daily with lipids/LFTs in 2 months.   [name] 05/18/2014

## 2014-05-20 ENCOUNTER — Telehealth (INDEPENDENT_AMBULATORY_CARE_PROVIDER_SITE_OTHER): Payer: Self-pay

## 2014-05-20 NOTE — Telephone Encounter (Signed)
Patient states he called yesterday to inform DR. Donne Hazel he ready o move forward with surgery. I advised him it could take a few days for [facility] schedulers to call him with Date /time .

## 2014-05-20 NOTE — Telephone Encounter (Signed)
He will need medical clearance first

## 2014-05-21 ENCOUNTER — Encounter (HOSPITAL_BASED_OUTPATIENT_CLINIC_OR_DEPARTMENT_OTHER): Payer: Medicare HMO | Attending: General Surgery

## 2014-05-21 ENCOUNTER — Encounter: Payer: Self-pay | Admitting: *Deleted

## 2014-05-21 DIAGNOSIS — T8189XA Other complications of procedures, not elsewhere classified, initial encounter: Secondary | ICD-10-CM | POA: Diagnosis present

## 2014-05-21 DIAGNOSIS — Y849 Medical procedure, unspecified as the cause of abnormal reaction of the patient, or of later complication, without mention of misadventure at the time of the procedure: Secondary | ICD-10-CM | POA: Diagnosis not present

## 2014-05-22 ENCOUNTER — Encounter (INDEPENDENT_AMBULATORY_CARE_PROVIDER_SITE_OTHER): Payer: Self-pay

## 2014-05-22 NOTE — Telephone Encounter (Signed)
Tried calling pt back but n/a not able to leave message. Pt needs medical clearance by Dr Scarlette Calico before he can get scheduled by Dr Donne Hazel. I have faxed the medical clearance request to Dr [name]

## 2014-05-23 DIAGNOSIS — B0052 Herpesviral keratitis: Secondary | ICD-10-CM | POA: Insufficient documentation

## 2014-05-23 DIAGNOSIS — IMO0002 Reserved for concepts with insufficient information to code with codable children: Secondary | ICD-10-CM | POA: Insufficient documentation

## 2014-05-23 DIAGNOSIS — E083593 Diabetes mellitus due to underlying condition with proliferative diabetic retinopathy without macular edema, bilateral: Secondary | ICD-10-CM | POA: Insufficient documentation

## 2014-05-23 DIAGNOSIS — Z794 Long term (current) use of insulin: Secondary | ICD-10-CM | POA: Insufficient documentation

## 2014-05-23 DIAGNOSIS — E1165 Type 2 diabetes mellitus with hyperglycemia: Secondary | ICD-10-CM | POA: Insufficient documentation

## 2014-05-23 DIAGNOSIS — H3340 Traction detachment of retina, unspecified eye: Secondary | ICD-10-CM | POA: Insufficient documentation

## 2014-05-28 ENCOUNTER — Telehealth: Payer: Self-pay | Admitting: *Deleted

## 2014-05-28 NOTE — Telephone Encounter (Signed)
Pt stated when he had hernia repair with Dr. Donne Hazel the area got infected he still have a mass. He stated last night he had diarrhea, temp 101.0. Dr. Donne Hazel fax over a medical clearance form 05/22/14. Pt went ahead and made appt to see Dr. [name] 06/02/14 for medical clearance....[name]

## 2014-05-28 NOTE — Telephone Encounter (Signed)
Left msg on triage stating his surgeon has fax clearance over to md. He wanting to know is he cleared for surgery...Johny Chess

## 2014-05-28 NOTE — Telephone Encounter (Signed)
What surgery?

## 2014-05-29 ENCOUNTER — Encounter: Payer: Self-pay | Admitting: Endocrinology

## 2014-05-29 ENCOUNTER — Telehealth: Payer: Self-pay | Admitting: Internal Medicine

## 2014-05-29 ENCOUNTER — Ambulatory Visit (INDEPENDENT_AMBULATORY_CARE_PROVIDER_SITE_OTHER): Payer: Commercial Managed Care - HMO | Admitting: Endocrinology

## 2014-05-29 VITALS — BP 132/74 | HR 76 | Temp 97.8°F | Ht 72.0 in | Wt >= 6400 oz

## 2014-05-29 DIAGNOSIS — E109 Type 1 diabetes mellitus without complications: Secondary | ICD-10-CM

## 2014-05-29 NOTE — Telephone Encounter (Signed)
Patient has an appointment to come in on 9/14 at 4pm for surgical clearance in regards to changing mesh out b/c of infection.  He has tried to find out from the surgeon the name of the mesh b.c he has heard that AlloDerm mesh has been defective.  He wants to make sure that AlloDerm was not used.  He would like Dr. [name] to try to find this out before his appointment on Monday.

## 2014-05-29 NOTE — Progress Notes (Signed)
Subjective:    Patient ID: [name], male    DOB: November 09, 1955, 58 y.o.   MRN: [id number]  HPI pt returns for f/u of insulin-requiring DM (dx'ed 123XX123; complicated by sensory neuropathy of the lower extremities, proliferative retinopathy, and renal insufficiency; he had gastric banding surgery in 2008, at [place]; he lost 200 lbs, but has since regained much of that; pt says he wasn't accepted as a bariatric patient at surgery here in [place], and he does not wish to go back to [facility]; he has never had pancreatitis, severe hypoglycemia, or DKA; he is on a simple qd insulin regimen, due to noncompliance).   He says he does not miss the insulin.  no cbg record, but states cbg's vary from 58-200's.  He checks in am only.   Past Medical History  Diagnosis Date  . Diabetes mellitus     Has Hx of diabetic foot ulcer & peripheral neuropathy  . Hyperlipidemia   . HTN (hypertension)   . Morbid obesity   . History of PFTs 05/2009    Mild Obstructive defect  . OSA on CPAP   . Anemia   . Colon cancer 2007    s/p sigmoid colectomy  . CHF (congestive heart failure)     Presumed diastolic. Echo (06/07) w.EF 45%, severe posterior HK, mild LV hypertrophy, No further work-up of abnormal echo was done.    Past Surgical History  Procedure Laterality Date  . Sigmoid colectomy  10/2005    [facility]  . Corneal transplant  [facility]    '80/Right  '84/Left  . Bariatric surgery  12/2009    Lap Band/At [facility]  . Hip surgery      bilateral  . Colonoscopy  multiple  . Tonsillectomy    . Colonoscopy with propofol N/A 11/26/2012    Procedure: COLONOSCOPY WITH PROPOFOL;  Surgeon: Gatha Mayer, MD;  Location: [facility];  Service: Endoscopy;  Laterality: N/A;  may need pre appt. with anesthesia due to morbid obesity  . Incisional hernia repair N/A 12/18/2013    Procedure:  REPAIR OF INCARCERATED INCISIONAL HERNIA;  Surgeon: Rolm Bookbinder, MD;  Location: [facility];  Service: General;  Laterality: N/A;  .  Insertion of mesh N/A 12/18/2013    Procedure: INSERTION OF MESH;  Surgeon: Rolm Bookbinder, MD;  Location: [facility];  Service: General;  Laterality: N/A;    History   Social History  . Marital Status: Legally Separated    Spouse Name: N/A    Number of Children: 0  . Years of Education: N/A   Occupational History  . Food Industry    Social History Main Topics  . Smoking status: Never Smoker   . Smokeless tobacco: Never Used  . Alcohol Use: No     Comment: Only Occasional.  . Drug Use: No  . Sexual Activity: Not Currently   Other Topics Concern  . 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   In new relationship (04/2009) - girlfriend helps taste for cooking business and helps monitor for ulcers.   Regular Exercise- No          Current Outpatient Prescriptions on File Prior to Visit  Medication Sig Dispense Refill  .  amoxicillin-clavulanate (AUGMENTIN) 875-125 MG per tablet Take 1 tablet by mouth 2 (two) times daily.  30 tablet  0  . aspirin EC 325 MG tablet Take 325 mg by mouth every evening.       Marland Kitchen atorvastatin (LIPITOR) 40 MG tablet Take 1 tablet (40 mg total) by mouth daily.  30 tablet  3  . bisoprolol (ZEBETA) 10 MG tablet Take 10 mg by mouth every evening.      . ferrous sulfate 325 (65 FE) MG tablet Take 325 mg by mouth 2 (two) times daily with a meal.      . furosemide (LASIX) 40 MG tablet Take 40 mg by mouth 2 (two) times daily.      . insulin detemir (LEVEMIR) 100 UNIT/ML injection Inject 160 Units into the skin every morning.       Marland Kitchen levothyroxine (SYNTHROID, LEVOTHROID) 50 MCG tablet Take 50 mcg by mouth daily before breakfast.      . losartan (COZAAR) 25 MG tablet Take 1 tablet (25 mg total) by mouth daily.  30 tablet  4  . polyethylene  glycol powder (GLYCOLAX/MIRALAX) powder Take 17 g by mouth daily as needed for mild constipation.       . vitamin C (ASCORBIC ACID) 500 MG tablet Take 1,000 mg by mouth every morning.       . vitamin E 200 UNIT capsule Take 400 Units by mouth every morning.        No current facility-administered medications on file prior to visit.    Allergies  Allergen Reactions  . Shellfish Allergy Anaphylaxis  . Lisinopril Cough    Family History  Problem Relation Age of Onset  . Hepatitis Mother     hepatitis C  . Heart attack Brother 77  . Cancer Neg Hx     colon or prostate  . Diabetes Mother   . Diabetes Father   . Congestive Heart Failure Mother   . CAD Mother   . Heart failure Brother     BP 132/74  Pulse 76  Temp(Src) 97.8 F (36.6 C) (Oral)  Ht 6' (1.829 m)  Wt 451 lb (204.572 kg)  BMI 61.15 kg/m2  SpO2 94%    Review of Systems He denies weight change and LOC    Objective:   Physical Exam VITAL SIGNS:  See vs page GENERAL: no distress.  Morbid obesity.  In wheelchair Pulses: dorsalis pedis intact bilat.  Feet: no deformity.  1+ bilat leg edema. There is bilateral onychomycosis  Skin: no ulcer on the feet. Dry skin. feet are of normal color and temp Neuro: sensation is intact to touch on the feet, but decreased from normal    i have reviewed the following outside records: Office notes     Assessment & Plan:  DM: moderate exacerbation Noncompliance with cbg recording, insulin dosing, and f/u ov's: I'll work around this as best I can. Abdominal infection: he needs better glycemic control in order to optimize healing.   Patient is advised the following: Patient Instructions  check your blood sugar twice a day.  vary the time of day when you check, between before the 3 meals, and at bedtime.  also check if you have symptoms of your blood sugar being too high or too low.  please keep a record of the readings and bring it to your next appointment here.  please call  us sooner if your blood sugar goes below 70, or if you have a lot of readings over 200.  Please change to taking all of the insulin to the morning only.  Getting your blood sugar better will help heal your infection.     Please come back for a follow-up appointment in 2 weeks.

## 2014-05-29 NOTE — Telephone Encounter (Signed)
He will have to call the surgeon who did the surgery about this, I will not address this

## 2014-05-29 NOTE — Patient Instructions (Addendum)
check your blood sugar twice a day.  vary the time of day when you check, between before the 3 meals, and at bedtime.  also check if you have symptoms of your blood sugar being too high or too low.  please keep a record of the readings and bring it to your next appointment here.  please call us sooner if your blood sugar goes below 70, or if you have a lot of readings over 200.     Please change to taking all of the insulin to the morning only.  Getting your blood sugar better will help heal your infection.     Please come back for a follow-up appointment in 2 weeks.

## 2014-05-30 ENCOUNTER — Other Ambulatory Visit (INDEPENDENT_AMBULATORY_CARE_PROVIDER_SITE_OTHER): Payer: Commercial Managed Care - HMO

## 2014-05-30 DIAGNOSIS — I10 Essential (primary) hypertension: Secondary | ICD-10-CM

## 2014-05-30 DIAGNOSIS — I5032 Chronic diastolic (congestive) heart failure: Secondary | ICD-10-CM

## 2014-05-30 LAB — BASIC METABOLIC PANEL
BUN: 29 mg/dL — AB (ref 6–23)
CO2: 24 mEq/L (ref 19–32)
CREATININE: 1.6 mg/dL — AB (ref 0.4–1.5)
Calcium: 8.5 mg/dL (ref 8.4–10.5)
Chloride: 104 mEq/L (ref 96–112)
GFR: 58.1 mL/min — AB (ref >60.00)
Glucose, Bld: 318 mg/dL — ABNORMAL HIGH (ref 70–99)
POTASSIUM: 4.6 meq/L (ref 3.5–5.1)
Sodium: 136 mEq/L (ref 135–145)

## 2014-05-30 NOTE — Telephone Encounter (Signed)
Noted  

## 2014-05-30 NOTE — Telephone Encounter (Signed)
Patient states he is not coming in for the appointment next week.

## 2014-06-02 ENCOUNTER — Institutional Professional Consult (permissible substitution): Payer: Commercial Managed Care - HMO | Admitting: Internal Medicine

## 2014-06-02 ENCOUNTER — Other Ambulatory Visit: Payer: Self-pay | Admitting: *Deleted

## 2014-06-02 DIAGNOSIS — I5032 Chronic diastolic (congestive) heart failure: Secondary | ICD-10-CM

## 2014-06-02 DIAGNOSIS — I10 Essential (primary) hypertension: Secondary | ICD-10-CM

## 2014-06-12 ENCOUNTER — Ambulatory Visit: Payer: Commercial Managed Care - HMO | Admitting: Endocrinology

## 2014-06-16 ENCOUNTER — Other Ambulatory Visit: Payer: Commercial Managed Care - HMO

## 2014-06-20 ENCOUNTER — Encounter (INDEPENDENT_AMBULATORY_CARE_PROVIDER_SITE_OTHER): Payer: Commercial Managed Care - HMO | Admitting: General Surgery

## 2014-06-24 ENCOUNTER — Telehealth: Payer: Self-pay

## 2014-06-24 NOTE — Telephone Encounter (Signed)
Received form from [place] division for MD to complete. Called pt to advised that is signature is requirerd before we may release any information// no answer or VM.

## 2014-06-25 ENCOUNTER — Telehealth: Payer: Self-pay | Admitting: Endocrinology

## 2014-06-25 NOTE — Telephone Encounter (Signed)
See below and please advise pt. Thanks!

## 2014-06-25 NOTE — Telephone Encounter (Signed)
Patient called stating that the needles are leaving bumps in his stomach Is this normal? Will this go away?   Please advise patient    Thank you

## 2014-06-26 ENCOUNTER — Telehealth: Payer: Self-pay | Admitting: Internal Medicine

## 2014-06-26 NOTE — Telephone Encounter (Signed)
Unable to reach pt

## 2014-06-26 NOTE — Telephone Encounter (Signed)
Pt advised of noted below, Pt voiced understanding.

## 2014-06-26 NOTE — Telephone Encounter (Signed)
This can happen.  It will go away with time.  You can minimize it by dividing the insulin into several smaller injections.

## 2014-06-26 NOTE — Telephone Encounter (Signed)
Patient brought letter to be filled out for transportation, it need to revised, it stated that patients legs are cut off,(patient legs are not cut off). Transportation is faxing letter back. Thank you

## 2014-06-30 NOTE — Telephone Encounter (Signed)
Patient have been coughing all night and like to know if the Dr would call him something in for his cough. Please advise

## 2014-06-30 NOTE — Telephone Encounter (Signed)
Pt last seen 04/2014, need appt. Thanks

## 2014-07-04 ENCOUNTER — Ambulatory Visit: Payer: Commercial Managed Care - HMO | Admitting: Internal Medicine

## 2014-07-04 DIAGNOSIS — Z0279 Encounter for issue of other medical certificate: Secondary | ICD-10-CM

## 2014-07-10 ENCOUNTER — Telehealth: Payer: Self-pay | Admitting: Endocrinology

## 2014-07-10 NOTE — Telephone Encounter (Signed)
please call patient: As of last ov here, i had him on 160 units qam.  Please verify dosage.  How are cbg's doing?

## 2014-07-10 NOTE — Telephone Encounter (Signed)
See below and please advise if this is the correct dosage I could not locate where the dosage has been increased.  Thanks!

## 2014-07-10 NOTE — Telephone Encounter (Signed)
levemir 240 u daily call into [facility] please 30 day supply

## 2014-07-10 NOTE — Telephone Encounter (Signed)
If pt says it is well-controlled on 240 units/day, let's continue that dosage.  Please refill prn

## 2014-07-10 NOTE — Telephone Encounter (Signed)
Contacted pt and discussed insulin. Pt states that he has been taking 240 units for the past few days because that's what he thought he was suppose to be taking. Pt states that he must have gotten confused. Pt states that his sugars have been running mid 100's while taking this dosage. Pt was advised that at last ov he he was instructed to take 160 units. Pt states that he will start taking 160 units now and keep our office updated on how his sugars are doing. Ok to send in rx for 160 units or does any adjustments need to be made? Thanks!

## 2014-07-10 NOTE — Telephone Encounter (Signed)
Pt advised of note below and rx for Levemir 240 units per day called in to pharmacy.

## 2014-07-15 DIAGNOSIS — Z0279 Encounter for issue of other medical certificate: Secondary | ICD-10-CM

## 2014-07-16 ENCOUNTER — Ambulatory Visit: Payer: Commercial Managed Care - HMO | Admitting: Endocrinology

## 2014-07-18 ENCOUNTER — Other Ambulatory Visit: Payer: Commercial Managed Care - HMO

## 2014-07-18 ENCOUNTER — Telehealth: Payer: Self-pay | Admitting: *Deleted

## 2014-07-18 DIAGNOSIS — M160 Bilateral primary osteoarthritis of hip: Secondary | ICD-10-CM

## 2014-07-18 DIAGNOSIS — E1142 Type 2 diabetes mellitus with diabetic polyneuropathy: Secondary | ICD-10-CM

## 2014-07-18 NOTE — Telephone Encounter (Signed)
Left msg on triage requesting an rx for new rollator due to having duck tape on his. Need fax to [facility] @ [phone]. MD out this afternoon can hold until her return on Monday...Johny Chess

## 2014-07-20 NOTE — Telephone Encounter (Signed)
Ok with me 

## 2014-07-21 NOTE — Telephone Encounter (Signed)
Created DME md sign fax to [facility]...Johny Chess

## 2014-07-22 ENCOUNTER — Telehealth: Payer: Self-pay | Admitting: Internal Medicine

## 2014-07-22 NOTE — Telephone Encounter (Signed)
Pt called in requesting script for a walker

## 2014-07-22 NOTE — Telephone Encounter (Signed)
This has already been addressed with [facility] and order faxed over  07/21/14 as instructed.( See previous phone note)

## 2014-07-23 ENCOUNTER — Telehealth: Payer: Self-pay | Admitting: Pulmonary Disease

## 2014-07-23 DIAGNOSIS — G4733 Obstructive sleep apnea (adult) (pediatric): Secondary | ICD-10-CM

## 2014-07-23 NOTE — Telephone Encounter (Signed)
Per 12/16/13 OV w/ [facility]; Patient Instructions      Will try and locate your prior sleep study so that we can order a new cpap machine for you.  If this is not available, will need to repeat your study. Work on [employer] loss Will arrange followup with me once we get your new machine.     ATC NA WCB no VM

## 2014-07-24 NOTE — Telephone Encounter (Signed)
Per pt brother [name] pt needs RX for new mask for CPAP and tubing. He reports pt did not receive a new CPAP machine as order by [facility] in [place] reason he did not follow up in 8 weeks. I advised will call [facility].  Called [facility] and spoke with [place]. She reports insurance would not cover new machine in April. They filed claim again in May and pt was not able to afford copay for new machine. Pt told them he already had a machine and just needed supplies for it.   Pt has no pending appt with [facility]. He still has his old machine. Please advise [place] on sending supplies? thanks

## 2014-07-28 NOTE — Telephone Encounter (Signed)
I was told by the pt that his machine was not working properly, and is 56yrs old.  If he wants to stay with his current machine, I am ok with sending in order for new mask and supplies. He does need to followup with me again in march 2016.

## 2014-07-29 ENCOUNTER — Telehealth: Payer: Self-pay | Admitting: Internal Medicine

## 2014-07-29 DIAGNOSIS — L97529 Non-pressure chronic ulcer of other part of left foot with unspecified severity: Principal | ICD-10-CM

## 2014-07-29 DIAGNOSIS — E08621 Diabetes mellitus due to underlying condition with foot ulcer: Secondary | ICD-10-CM

## 2014-07-29 DIAGNOSIS — L97519 Non-pressure chronic ulcer of other part of right foot with unspecified severity: Principal | ICD-10-CM

## 2014-07-29 DIAGNOSIS — E1142 Type 2 diabetes mellitus with diabetic polyneuropathy: Secondary | ICD-10-CM

## 2014-07-29 DIAGNOSIS — M5441 Lumbago with sciatica, right side: Secondary | ICD-10-CM

## 2014-07-29 NOTE — Telephone Encounter (Signed)
Ok with me 

## 2014-07-29 NOTE — Telephone Encounter (Signed)
Pt called in and ask for dr put in order for extra large walker.  He received a standard one but he said that it is not big enough for him.

## 2014-07-29 NOTE — Telephone Encounter (Signed)
Spoke with pt and advised of Dr Janifer Adie recommendations.  Order placed to [facility] and f/u ov scheduled with [facility].

## 2014-07-30 NOTE — Telephone Encounter (Signed)
Notified pt md ok fax back to [name]...[name]

## 2014-08-11 ENCOUNTER — Ambulatory Visit (INDEPENDENT_AMBULATORY_CARE_PROVIDER_SITE_OTHER): Payer: Commercial Managed Care - HMO | Admitting: Internal Medicine

## 2014-08-11 ENCOUNTER — Other Ambulatory Visit: Payer: Self-pay | Admitting: *Deleted

## 2014-08-11 ENCOUNTER — Other Ambulatory Visit (INDEPENDENT_AMBULATORY_CARE_PROVIDER_SITE_OTHER): Payer: Commercial Managed Care - HMO

## 2014-08-11 ENCOUNTER — Encounter: Payer: Self-pay | Admitting: Internal Medicine

## 2014-08-11 VITALS — BP 120/84 | HR 84 | Temp 97.8°F | Ht 72.0 in | Wt >= 6400 oz

## 2014-08-11 DIAGNOSIS — E1021 Type 1 diabetes mellitus with diabetic nephropathy: Secondary | ICD-10-CM

## 2014-08-11 DIAGNOSIS — I10 Essential (primary) hypertension: Secondary | ICD-10-CM

## 2014-08-11 DIAGNOSIS — N402 Nodular prostate without lower urinary tract symptoms: Secondary | ICD-10-CM

## 2014-08-11 DIAGNOSIS — M545 Low back pain, unspecified: Secondary | ICD-10-CM

## 2014-08-11 DIAGNOSIS — M160 Bilateral primary osteoarthritis of hip: Secondary | ICD-10-CM

## 2014-08-11 DIAGNOSIS — E038 Other specified hypothyroidism: Secondary | ICD-10-CM

## 2014-08-11 LAB — BASIC METABOLIC PANEL
BUN: 40 mg/dL — ABNORMAL HIGH (ref 6–23)
CALCIUM: 8.9 mg/dL (ref 8.4–10.5)
CO2: 24 mEq/L (ref 19–32)
CREATININE: 1.7 mg/dL — AB (ref 0.4–1.5)
Chloride: 103 mEq/L (ref 96–112)
GFR: 53.38 mL/min — AB (ref >60.00)
Glucose, Bld: 241 mg/dL — ABNORMAL HIGH (ref 70–99)
Potassium: 4.4 mEq/L (ref 3.5–5.1)
SODIUM: 140 meq/L (ref 135–145)

## 2014-08-11 LAB — CBC WITH DIFFERENTIAL/PLATELET
Basophils Absolute: 0 10*3/uL (ref 0.0–0.1)
Basophils Relative: 0.4 % (ref 0.0–3.0)
EOS ABS: 0.4 10*3/uL (ref 0.0–0.7)
EOS PCT: 5.8 % — AB (ref 0.0–5.0)
HCT: 33.8 % — ABNORMAL LOW (ref 39.0–52.0)
HEMOGLOBIN: 10.4 g/dL — AB (ref 13.0–17.0)
LYMPHS PCT: 21.1 % (ref 12.0–46.0)
Lymphs Abs: 1.5 10*3/uL (ref 0.7–4.0)
MCHC: 31 g/dL (ref 30.0–36.0)
MCV: 82.9 fl (ref 78.0–100.0)
Monocytes Absolute: 0.5 10*3/uL (ref 0.1–1.0)
Monocytes Relative: 6.4 % (ref 3.0–12.0)
NEUTROS ABS: 4.8 10*3/uL (ref 1.4–7.7)
NEUTROS PCT: 66.3 % (ref 43.0–77.0)
Platelets: 277 10*3/uL (ref 150.0–400.0)
RBC: 4.08 Mil/uL — AB (ref 4.22–5.81)
RDW: 16.3 % — ABNORMAL HIGH (ref 11.5–15.5)
WBC: 7.2 10*3/uL (ref 4.0–10.5)

## 2014-08-11 LAB — URINALYSIS, ROUTINE W REFLEX MICROSCOPIC
Bilirubin Urine: NEGATIVE
Ketones, ur: NEGATIVE
LEUKOCYTES UA: NEGATIVE
NITRITE: NEGATIVE
PH: 6 (ref 5.0–8.0)
RBC / HPF: NONE SEEN (ref 0–?)
SPECIFIC GRAVITY, URINE: 1.01 (ref 1.000–1.030)
TOTAL PROTEIN, URINE-UPE24: NEGATIVE
Urine Glucose: 100 — AB
Urobilinogen, UA: 0.2 (ref 0.0–1.0)
WBC UA: NONE SEEN (ref 0–?)

## 2014-08-11 LAB — TSH: TSH: 2 u[IU]/mL (ref 0.35–4.50)

## 2014-08-11 LAB — PSA: PSA: 0.92 ng/mL (ref 0.10–4.00)

## 2014-08-11 LAB — HEMOGLOBIN A1C: HEMOGLOBIN A1C: 10.1 % — AB (ref 4.6–6.5)

## 2014-08-11 MED ORDER — HYDROCODONE-ACETAMINOPHEN 5-325 MG PO TABS: 1.0000 | ORAL_TABLET | Freq: Four times a day (QID) | ORAL | Status: DC | PRN

## 2014-08-11 MED ORDER — CYCLOSPORINE 0.05 % OP EMUL: 1.0000 [drp] | Freq: Two times a day (BID) | OPHTHALMIC | Status: DC

## 2014-08-11 NOTE — Assessment & Plan Note (Signed)
I will recheck his A1C today and will advise further

## 2014-08-11 NOTE — Telephone Encounter (Signed)
Please advise refill? Last filled by [facility].

## 2014-08-11 NOTE — Progress Notes (Signed)
Pre visit review using our clinic review tool, if applicable. No additional management support is needed unless otherwise documented below in the visit note. 

## 2014-08-11 NOTE — Progress Notes (Signed)
Subjective:    Patient ID: [name], male    DOB: 1956/07/29, 58 y.o.   MRN: [id number]  Arthritis Presents for follow-up visit. The disease course has been worsening. He complains of pain. He reports no stiffness, joint swelling or joint warmth. Affected locations include the right hip and left hip. His pain is at a severity of 6/10. Associated symptoms include pain at night and pain while resting. Pertinent negatives include no diarrhea, dry eyes, dry mouth, dysuria, fatigue, fever, rash, Raynaud's syndrome, uveitis or weight loss. His past medical history is significant for osteoarthritis. There is no history of chronic back pain, lupus, psoriasis or rheumatoid arthritis.  Past treatments include aspirin. The treatment provided mild relief. Factors aggravating his arthritis include activity.      Review of Systems  Constitutional: Positive for unexpected weight change (20 # weight gain). Negative for fever, chills, weight loss, diaphoresis, appetite change and fatigue.  HENT: Negative.   Eyes: Negative.   Respiratory: Negative.  Negative for cough, choking, chest tightness, shortness of breath and stridor.   Cardiovascular: Negative.  Negative for chest pain, palpitations and leg swelling.  Gastrointestinal: Negative.  Negative for nausea, vomiting, abdominal pain, diarrhea, constipation and blood in stool.  Endocrine: Negative.   Genitourinary: Negative.  Negative for dysuria and difficulty urinating.  Musculoskeletal: Positive for arthralgias and arthritis. Negative for myalgias, back pain, joint swelling, gait problem, stiffness, neck pain and neck stiffness.  Skin: Negative.  Negative for rash.  Allergic/Immunologic: Negative.   Neurological: Negative.  Negative for dizziness, tremors, syncope, light-headedness, numbness and headaches.  Hematological: Negative.  Negative for adenopathy. Does not bruise/bleed easily.  Psychiatric/Behavioral: Negative.        Objective:   Physical Exam  Constitutional: He is oriented to person, place, and time. He appears well-developed and well-nourished. No distress.  HENT:  Head: Normocephalic and atraumatic.  Mouth/Throat: Oropharynx is clear and moist. No oropharyngeal exudate.  Eyes: Conjunctivae are normal. Right eye exhibits no discharge. Left eye exhibits no discharge. No scleral icterus.  Neck: Normal range of motion. Neck supple. No JVD present. No tracheal deviation present. No thyromegaly present.  Cardiovascular: Normal rate, regular rhythm, normal heart sounds and intact distal pulses.  Exam reveals no gallop and no friction rub.   No murmur heard. Pulmonary/Chest: Effort normal and breath sounds normal. No stridor. No respiratory distress. He has no wheezes. He has no rales. He exhibits no tenderness.  Abdominal: Soft. Bowel sounds are normal. He exhibits no distension and no mass. There is no tenderness. There is no rebound and no guarding.  Musculoskeletal: Normal range of motion. He exhibits no edema or tenderness.  He can only take a few steps without his walker. He has prominent leg length discrepancy.   Lymphadenopathy:    He has no cervical adenopathy.  Neurological: He is oriented to person, place, and time.  Skin: Skin is warm. No rash noted. He is not diaphoretic. No erythema. No pallor.  Psychiatric: He has a normal mood and affect. His behavior is normal. Judgment and thought content normal.  Vitals reviewed.     Lab Results  Component Value Date   WBC 6.7 04/15/2014   HGB 9.5* 04/15/2014   HCT 30.3* 04/15/2014   PLT 251 04/15/2014   GLUCOSE 318* 05/30/2014   CHOL 188 04/30/2014   TRIG 79.0 04/30/2014   HDL 36.60* 04/30/2014   LDLDIRECT 136.8 12/12/2006   LDLCALC 136* 04/30/2014   ALT 16 12/17/2013   AST 18 12/17/2013  NA 136 05/30/2014   K 4.6 05/30/2014   CL 104 05/30/2014   CREATININE 1.6* 05/30/2014   BUN 29* 05/30/2014   CO2 24 05/30/2014   TSH 1.34 04/30/2014   PSA 0.71  01/11/2013   HGBA1C 8.5* 04/30/2014   MICROALBUR 4.7* 05/07/2013      Assessment & Plan:

## 2014-08-11 NOTE — Patient Instructions (Signed)
Hypothyroidism The thyroid is a large gland located in the lower front of your neck. The thyroid gland helps control metabolism. Metabolism is how your body handles food. It controls metabolism with the hormone thyroxine. When this gland is underactive (hypothyroid), it produces too little hormone.  CAUSES These include:   Absence or destruction of thyroid tissue.  Goiter due to iodine deficiency.  Goiter due to medications.  Congenital defects (since birth).  Problems with the pituitary. This causes a lack of TSH (thyroid stimulating hormone). This hormone tells the thyroid to turn out more hormone. SYMPTOMS  Lethargy (feeling as though you have no energy)  Cold intolerance  Weight gain (in spite of normal food intake)  Dry skin  Coarse hair  Menstrual irregularity (if severe, may lead to infertility)  Slowing of thought processes Cardiac problems are also caused by insufficient amounts of thyroid hormone. Hypothyroidism in the newborn is cretinism, and is an extreme form. It is important that this form be treated adequately and immediately or it will lead rapidly to retarded physical and mental development. DIAGNOSIS  To prove hypothyroidism, your caregiver may do blood tests and ultrasound tests. Sometimes the signs are hidden. It may be necessary for your caregiver to watch this illness with blood tests either before or after diagnosis and treatment. TREATMENT  Low levels of thyroid hormone are increased by using synthetic thyroid hormone. This is a safe, effective treatment. It usually takes about four weeks to gain the full effects of the medication. After you have the full effect of the medication, it will generally take another four weeks for problems to leave. Your caregiver may start you on low doses. If you have had heart problems the dose may be gradually increased. It is generally not an emergency to get rapidly to normal. HOME CARE INSTRUCTIONS   Take your  medications as your caregiver suggests. Let your caregiver know of any medications you are taking or start taking. Your caregiver will help you with dosage schedules.  As your condition improves, your dosage needs may increase. It will be necessary to have continuing blood tests as suggested by your caregiver.  Report all suspected medication side effects to your caregiver. SEEK MEDICAL CARE IF: Seek medical care if you develop:  Sweating.  Tremulousness (tremors).  Anxiety.  Rapid weight loss.  Heat intolerance.  Emotional swings.  Diarrhea.  Weakness. SEEK IMMEDIATE MEDICAL CARE IF:  You develop chest pain, an irregular heart beat (palpitations), or a rapid heart beat. MAKE SURE YOU:   Understand these instructions.  Will watch your condition.  Will get help right away if you are not doing well or get worse. Document Released: 09/05/2005 Document Revised: 11/28/2011 Document Reviewed: 04/25/2008 [employer] Patient Information 2015 [employer]. This information is not intended to replace advice given to you by your health care provider. Make sure you discuss any questions you have with your health care provider.  

## 2014-08-11 NOTE — Assessment & Plan Note (Signed)
Will try norco for the pain I have asked him to see ortho about this

## 2014-08-11 NOTE — Assessment & Plan Note (Signed)
Will try norco for the pain

## 2014-08-11 NOTE — Assessment & Plan Note (Signed)
He has been taking aspirin for pain I have asked to stop  Will recheck his renal function today

## 2014-08-20 ENCOUNTER — Telehealth: Payer: Self-pay | Admitting: Internal Medicine

## 2014-08-20 ENCOUNTER — Ambulatory Visit: Payer: Commercial Managed Care - HMO | Admitting: Cardiology

## 2014-08-20 DIAGNOSIS — E669 Obesity, unspecified: Secondary | ICD-10-CM

## 2014-08-20 NOTE — Telephone Encounter (Signed)
Please advise 

## 2014-08-20 NOTE — Telephone Encounter (Signed)
Yes to all. 

## 2014-08-20 NOTE — Telephone Encounter (Signed)
Patient needs orders for a power lift sent to [facility].  [phone] [facility]  [phone] Fax Needs script, notes and demographics sent.

## 2014-08-21 NOTE — Telephone Encounter (Signed)
Done

## 2014-08-25 ENCOUNTER — Telehealth: Payer: Self-pay | Admitting: Internal Medicine

## 2014-08-25 NOTE — Telephone Encounter (Signed)
Pt called in and said that the order was for the wrong thing.  The order needs to say Lift Chair.

## 2014-08-26 ENCOUNTER — Ambulatory Visit: Payer: Commercial Managed Care - HMO | Admitting: Family

## 2014-09-01 ENCOUNTER — Ambulatory Visit (INDEPENDENT_AMBULATORY_CARE_PROVIDER_SITE_OTHER): Payer: Commercial Managed Care - HMO | Admitting: Family

## 2014-09-01 ENCOUNTER — Encounter: Payer: Self-pay | Admitting: Family

## 2014-09-01 VITALS — BP 150/92 | HR 79 | Temp 98.2°F | Resp 16 | Ht 72.0 in | Wt >= 6400 oz

## 2014-09-01 DIAGNOSIS — T20211A Burn of second degree of right ear [any part, except ear drum], initial encounter: Secondary | ICD-10-CM

## 2014-09-01 DIAGNOSIS — T20019A Burn of unspecified degree of unspecified ear [any part, except ear drum], initial encounter: Secondary | ICD-10-CM | POA: Insufficient documentation

## 2014-09-01 MED ORDER — CEPHALEXIN 500 MG PO CAPS: 500.0000 mg | ORAL_CAPSULE | Freq: Four times a day (QID) | ORAL | Status: DC

## 2014-09-01 NOTE — Progress Notes (Signed)
Subjective:    Patient ID: [name], male    DOB: 07-31-1956, 58 y.o.   MRN: [id number]  Chief Complaint  Patient presents with  . bumps    has bumps on neck and right ear x4 days, used magic shave on his head and thats when it started    HPI:  [name] is a 58 y.o. male who presents today for an acute visit.   Acute symptoms of lumps on his neck and right ear started approximately 4 days ago following the use of Magic shave extra strength. Bones are located on his right outer ear, under his right maxilla, and under his neck. He has not tried any treatments to clear these. Denies anything that makes it better or worse.  Allergies  Allergen Reactions  . Shellfish Allergy Anaphylaxis  . Lisinopril Cough   Current Outpatient Prescriptions on File Prior to Visit  Medication Sig Dispense Refill  . aspirin EC 325 MG tablet Take 325 mg by mouth every evening.     Marland Kitchen atorvastatin (LIPITOR) 40 MG tablet Take 1 tablet (40 mg total) by mouth daily. 30 tablet 3  . bisoprolol (ZEBETA) 10 MG tablet Take 10 mg by mouth every evening.    . cycloSPORINE (RESTASIS) 0.05 % ophthalmic emulsion Place 1 drop into both eyes 2 (two) times daily. 0.4 mL 5  . ferrous sulfate 325 (65 FE) MG tablet Take 325 mg by mouth 2 (two) times daily with a meal.    . furosemide (LASIX) 40 MG tablet Take 40 mg by mouth 2 (two) times daily.    Marland Kitchen HYDROcodone-acetaminophen (NORCO/VICODIN) 5-325 MG per tablet Take 1 tablet by mouth every 6 (six) hours as needed for moderate pain. 90 tablet 0  . insulin detemir (LEVEMIR) 100 UNIT/ML injection Inject 240 Units into the skin every morning.     Marland Kitchen levothyroxine (SYNTHROID, LEVOTHROID) 50 MCG tablet Take 50 mcg by mouth daily before breakfast.    . losartan (COZAAR) 25 MG tablet Take 1 tablet (25 mg total) by mouth daily. 30 tablet 4  . polyethylene glycol powder (GLYCOLAX/MIRALAX) powder Take 17 g by mouth daily as needed for mild constipation.     . vitamin C (ASCORBIC  ACID) 500 MG tablet Take 1,000 mg by mouth every morning.     . vitamin E 200 UNIT capsule Take 400 Units by mouth every morning.      No current facility-administered medications on file prior to visit.    Review of Systems    See HPI   Objective:    BP 150/92 mmHg  Pulse 79  Temp(Src) 98.2 F (36.8 C) (Oral)  Resp 16  Ht 6' (1.829 m)  Wt 473 lb 1.9 oz (214.606 kg)  BMI 64.15 kg/m2  SpO2 96% Nursing note and vital signs reviewed.  Physical Exam  Constitutional: He is oriented to person, place, and time. He appears well-developed and well-nourished. No distress.  Pleasant, obese male seated in his walker. Dressed appropriately for the situation, appears his stated age  HENT:  Multiple vesicles and pustules noted around auricle of right ear.  Neck:    Cardiovascular: Normal rate, regular rhythm, normal heart sounds and intact distal pulses.   Pulmonary/Chest: Effort normal and breath sounds normal.  Neurological: He is alert and oriented to person, place, and time.  Skin: Skin is warm and dry.  Psychiatric: He has a normal mood and affect. His behavior is normal. Judgment and thought content normal.  Assessment & Plan:

## 2014-09-01 NOTE — Telephone Encounter (Signed)
Pt stopped by the office, spoke with scheduler and [facility]. Closing phone note

## 2014-09-01 NOTE — Patient Instructions (Signed)
Thank you for choosing [facility].  Summary/Instructions:  Your prescription(s) have been submitted to your pharmacy. Please take as directed and contact our office if you believe you are having problem(s) with the medication(s).  If your symptoms worsen or fail to improve, please contact our office for further instruction, or in case of emergency go directly to the emergency room at the closest medical facility.    Chemical Burn Many chemicals can burn the skin. A chemical burn should be flushed with cool water and checked by an emergency caregiver. Your skin is a natural barrier to infection. It is the largest organ of your body. Burns damage this natural protection. To help prevent infection, it is very important to follow your caregiver's instructions in the care of your burn.  Many industrial chemicals may cause burns. These chemicals include acids, alkalis, and organic compounds such as petroleum, phenol, bitumen, tar, and grease. When acids come in contact with the skin, they cause an immediate change in the skin.Acid burns produce significant pain and form a scab (eschar). Usually, the immediate skin changes are the only damage from an acid burn.However, exposure to formic acid, chromic acid, or hydrofluoric acid may affect the whole body and may even be life-threatening. Alkalis include lye, cement, lime, and many chemicals with "hydroxide" in their name.An alkali burn may be less apparent than an acid burn at first. However, alkalis may cause greater tissue damage.It is important to be aware of any chemicals you are using. Treat any exposure to skin, eyes, or mucous membranes (nose, mouth, throat) as a potential emergency. PREVENTION  Avoid exposure to toxic chemicals that can cause burns.  Store chemicals out of the reach of children.  Use protective gloves when handling dangerous chemicals. HOME CARE INSTRUCTIONS   Wash your hands well before changing your  bandage.  Change your bandage as often as directed by your caregiver.  Remove the old bandage. If the bandage sticks, you may soak it off with cool, clean water.  Cleanse the burn thoroughly but gently with mild soap and water.  Pat the area dry with a clean, dry cloth.  Apply a thin layer of antibacterial cream to the burn.  Apply a clean bandage as instructed by your caregiver.  Keep the bandage as clean and dry as possible.  Elevate the affected area for the first 24 hours, then as instructed by your caregiver.  Only take over-the-counter or prescription medicines for pain, discomfort, or fever as directed by your caregiver.  Keep all follow-up appointments.This is important. This is how your caregiver can tell if your treatment is working. SEEK IMMEDIATE MEDICAL CARE IF:   You develop excessive pain.  You develop redness, tenderness, swelling, or red streaks near the burn.  The burned area develops yellowish-white fluid (pus) or a bad smell.  You have a fever. MAKE SURE YOU:   Understand these instructions.  Will watch your condition.  Will get help right away if you are not doing well or get worse. Document Released: 06/11/2004 Document Revised: 11/28/2011 Document Reviewed: 01/31/2011 [facility] Patient Information 2015 [place], [place]. This information is not intended to replace advice given to you by your health care provider. Make sure you discuss any questions you have with your health care provider.
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
## 2014-09-01 NOTE — Progress Notes (Signed)
Pre visit review using our clinic review tool, if applicable. No additional management support is needed unless otherwise documented below in the visit note. 

## 2014-09-03 ENCOUNTER — Other Ambulatory Visit: Payer: Self-pay | Admitting: Internal Medicine

## 2014-09-05 ENCOUNTER — Telehealth: Payer: Self-pay | Admitting: Internal Medicine

## 2014-09-05 NOTE — Telephone Encounter (Signed)
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 Please advise. Fax # [phone].

## 2014-09-08 ENCOUNTER — Ambulatory Visit: Payer: Commercial Managed Care - HMO | Admitting: Internal Medicine

## 2014-09-08 NOTE — Telephone Encounter (Signed)
[name] with [facility] called in regards to check on this.  Please give a call back at [phone].  Did not see scanned into [employer]. Please give [name] a call back in regards. Did tell [name] she might have to refax the form to be completed again.

## 2014-09-16 ENCOUNTER — Telehealth: Payer: Self-pay | Admitting: *Deleted

## 2014-09-19 MED ORDER — INSULIN DETEMIR 100 UNIT/ML ~~LOC~~ SOLN: 240.0000 [IU] | SUBCUTANEOUS | Status: DC

## 2014-09-19 NOTE — Addendum Note (Signed)
Addended by: [name] on: 09/19/2014 11:44 AM   Modules accepted: Orders

## 2014-09-25 DIAGNOSIS — M1611 Unilateral primary osteoarthritis, right hip: Secondary | ICD-10-CM | POA: Diagnosis not present

## 2014-10-01 ENCOUNTER — Ambulatory Visit (INDEPENDENT_AMBULATORY_CARE_PROVIDER_SITE_OTHER): Payer: Commercial Managed Care - HMO | Admitting: Internal Medicine

## 2014-10-01 ENCOUNTER — Encounter: Payer: Self-pay | Admitting: Internal Medicine

## 2014-10-01 VITALS — BP 108/68 | HR 90 | Temp 97.9°F | Resp 16 | Ht 72.0 in | Wt >= 6400 oz

## 2014-10-01 DIAGNOSIS — E1021 Type 1 diabetes mellitus with diabetic nephropathy: Secondary | ICD-10-CM

## 2014-10-01 DIAGNOSIS — I10 Essential (primary) hypertension: Secondary | ICD-10-CM

## 2014-10-01 DIAGNOSIS — M1611 Unilateral primary osteoarthritis, right hip: Secondary | ICD-10-CM

## 2014-10-01 DIAGNOSIS — E785 Hyperlipidemia, unspecified: Secondary | ICD-10-CM

## 2014-10-01 DIAGNOSIS — E038 Other specified hypothyroidism: Secondary | ICD-10-CM

## 2014-10-01 DIAGNOSIS — I5032 Chronic diastolic (congestive) heart failure: Secondary | ICD-10-CM

## 2014-10-01 DIAGNOSIS — N189 Chronic kidney disease, unspecified: Secondary | ICD-10-CM

## 2014-10-01 DIAGNOSIS — E1022 Type 1 diabetes mellitus with diabetic chronic kidney disease: Secondary | ICD-10-CM

## 2014-10-01 MED ORDER — POLYETHYLENE GLYCOL 3350 17 GM/SCOOP PO POWD: 17.0000 g | Freq: Every day | ORAL | Status: DC | PRN

## 2014-10-01 MED ORDER — HYDROCODONE-ACETAMINOPHEN 10-325 MG PO TABS: 1.0000 | ORAL_TABLET | Freq: Three times a day (TID) | ORAL | Status: DC | PRN

## 2014-10-01 MED ORDER — FUROSEMIDE 40 MG PO TABS: 40.0000 mg | ORAL_TABLET | Freq: Two times a day (BID) | ORAL | Status: DC

## 2014-10-01 MED ORDER — LOSARTAN POTASSIUM 25 MG PO TABS
25.0000 mg | ORAL_TABLET | Freq: Every day | ORAL | Status: DC
Start: 2014-10-01 — End: 2015-09-10

## 2014-10-01 MED ORDER — ZETIA 10 MG PO TABS: 10.0000 mg | ORAL_TABLET | Freq: Every day | ORAL | Status: DC

## 2014-10-01 MED ORDER — INSULIN DETEMIR 100 UNIT/ML ~~LOC~~ SOLN: 240.0000 [IU] | SUBCUTANEOUS | Status: DC

## 2014-10-01 MED ORDER — BISOPROLOL FUMARATE 10 MG PO TABS: 10.0000 mg | ORAL_TABLET | Freq: Every evening | ORAL | Status: DC

## 2014-10-01 MED ORDER — ULTICARE SHORT PEN NEEDLES 31G X 8 MM MISC: 1.0000 | Freq: Every day | Status: DC

## 2014-10-01 MED ORDER — ATORVASTATIN CALCIUM 40 MG PO TABS: 40.0000 mg | ORAL_TABLET | Freq: Every day | ORAL | Status: DC

## 2014-10-01 NOTE — Assessment & Plan Note (Signed)
His last TSH was in the normal range Will cont the current synthroid dose for now

## 2014-10-01 NOTE — Assessment & Plan Note (Signed)
He saw ortho and was told that he has severe DJD but that he is not a surgical candidate He wants to increase the strength of the norco and to see if there are non-surgical options to treat the pain ----> sports med referral

## 2014-10-01 NOTE — Patient Instructions (Signed)
Type 1 Diabetes Mellitus Type 1 diabetes mellitus, often simply referred to as diabetes, is a long-term (chronic) disease. It occurs when the islet cells in the pancreas that make insulin (a hormone) are destroyed and can no longer make insulin. Insulin is needed to move sugars from food into the tissue cells. The tissue cells use the sugars for energy. In people with type 1 diabetes, the sugars build up in the blood instead of going into the tissue cells. As a result, high blood sugar (hyperglycemia) develops. Without insulin, the body breaks down fat cells for the needed energy. This breakdown of fat cells produces acid chemicals (ketones), which increases the acid levels in the body. The effect of either high ketone or high sugar (glucose) levels can be life-threatening.  Type 1 diabetes was also previously called juvenile diabetes. It most often occurs before the age of 81, but it can occur at any age. RISK FACTORS A person is predisposed to developing type 1 diabetes if someone in his or her family has the disease and is exposed to certain additional environmental triggers.  SYMPTOMS  Symptoms of type 1 diabetes may develop gradually over days to weeks or suddenly. The symptoms occur due to hyperglycemia. The symptoms can include:   Increased thirst (polydipsia).  Increased urination (polyuria).  Increased urination during the night (nocturia).  Weight loss. This weight loss may be rapid.  Frequent, recurring infections.  Tiredness (fatigue).  Weakness.  Vision changes, such as blurred vision.  Fruity smell to your breath.  Abdominal pain.  Nausea or vomiting. DIAGNOSIS  Type 1 diabetes is diagnosed when symptoms of diabetes are present and when blood glucose levels are increased. Your blood glucose level may be checked by one or more of the following blood tests:  A fasting blood glucose test. You will not be allowed to eat for at least 8 hours before a blood sample is  taken.  A random blood glucose test. Your blood glucose is checked at any time of the day regardless of when you ate.  A hemoglobin A1c blood glucose test. A hemoglobin A1c test provides information about blood glucose control over the previous 3 months. TREATMENT  Although type 1 diabetes cannot be prevented, it can be managed with insulin, diet, and exercise.  You will need to take insulin daily to keep blood glucose in the desired range.  You will need to match insulin dosing with exercise and healthy food choices. The treatment goal is to maintain the before-meal blood sugar (preprandial glucose) level at 70-130 mg/dL.  HOME CARE INSTRUCTIONS   Have your hemoglobin A1c level checked twice a year.  Perform daily blood glucose monitoring as directed by your health care provider.  Monitor urine ketones when you are ill and as directed by your health care provider.  Take your insulin as directed by your health care provider to maintain your blood glucose level in the desired range.  Never run out of insulin. It is needed every day.  Adjust insulin based on your intake of carbohydrates. Carbohydrates can raise blood glucose levels but need to be included in your diet. Carbohydrates provide vitamins, minerals, and fiber, which are an essential part of a healthy diet. Carbohydrates are found in fruits, vegetables, whole grains, dairy products, legumes, and foods containing added sugars.  Eat healthy foods. Alternate 3 meals with 3 snacks.  Maintain a healthy weight.  Carry a medical alert card or wear your medical alert jewelry.  Carry a 15-gram carbohydrate snack  with you at all times to treat low blood glucose (hypoglycemia). Some examples of 15-gram carbohydrate snacks include:  Glucose tablets, 3 or 4.  Glucose gel, 15-gram tube.  Raisins, 2 tablespoons (24 grams).  Jelly beans, 6.  Animal crackers, 8.  Fruit juice, regular soda, or low-fat milk, 4 ounces (120  mL).  Gummy treats, 9.  Recognize hypoglycemia. Hypoglycemia occurs with blood glucose levels of 70 mg/dL and below. The risk for hypoglycemia increases when fasting or skipping meals, during or after intense exercise, and during sleep. Hypoglycemia symptoms can include:  Tremors or shakes.  Decreased ability to concentrate.  Sweating.  Increased heart rate.  Headache.  Dry mouth.  Hunger.  Irritability.  Anxiety.  Restless sleep.  Altered speech or coordination.  Confusion.  Treat hypoglycemia promptly. If you are alert and able to safely swallow, follow the 15:15 rule:  Take 15-20 grams of rapid-acting glucose or carbohydrate. Rapid-acting options include glucose gel, glucose tablets, or 4 ounces (120 mL) of fruit juice, regular soda, or low-fat milk.  Check your blood glucose level 15 minutes after taking the glucose.  Take 15-20 grams more of glucose if the repeat blood glucose level is still 70 mg/dL or below.  Eat a meal or snack within 1 hour once blood glucose levels return to normal.  Be alert to polyuria and polydipsia, which are early signs of hyperglycemia. An early awareness of hyperglycemia allows for prompt treatment. Treat hyperglycemia as directed by your health care provider.  Exercise regularly as directed by your health care provider. This includes:  Performing resistance training twice a week such as push-ups, sit-ups, lifting weights, or using resistance bands.  Performing 150 minutes of cardio exercises each week such as walking, running, or playing sports.  Staying active and spending no more than 90 minutes at one time being inactive.  Adjust your insulin dosing and food intake as needed if you start a new exercise or sport.  Follow your sick-day plan at any time you are unable to eat or drink as usual.   Do not use any tobacco products including cigarettes, chewing tobacco, or electronic cigarettes. If you need help quitting, ask your  health care provider.  Limit alcohol intake to no more than 1 drink per day for nonpregnant women and 2 drinks per day for men. You should drink alcohol only when you are also eating food. Talk with your health care provider about whether alcohol is safe for you. Tell your health care provider if you drink alcohol several times a week.  Keep all follow-up visits as directed by your health care provider.  Schedule an eye exam within 5 years of diagnosis and then annually.  Perform daily skin and foot care. Examine your skin and feet daily for cuts, bruises, redness, nail problems, bleeding, blisters, or sores. A foot exam by a health care provider should be done annually.  Brush your teeth and gums at least twice a day and floss at least once a day. Follow up with your dentist regularly.  Share your diabetes management plan with your workplace or school.  Stay up-to-date with immunizations. It is recommended that people with diabetes who are over 42 years old get the pneumonia vaccine. In some cases, two separate shots may be given. Ask your health care provider if your pneumonia vaccination is up-to-date.  Learn to manage stress.  Obtain ongoing diabetes education and support as needed.  Participate in or seek rehabilitation as needed to maintain or improve independence  and quality of life. Request a physical or occupational therapy referral if you are having foot or hand numbness, or difficulties with grooming, dressing, eating, or physical activity. SEEK MEDICAL CARE IF:   You are unable to eat food or drink fluids for more than 6 hours.  You have nausea and vomiting for more than 6 hours.  Your blood glucose level is over 240 mg/dL.  There is a change in mental status.  You develop an additional serious illness.  You have diarrhea for more than 6 hours.  You have been sick or have had a fever for a couple of days and are not getting better.  You have pain during any physical  activity. SEEK IMMEDIATE MEDICAL CARE IF:  You have difficulty breathing.  You have moderate to large ketone levels. MAKE SURE YOU:  Understand these instructions.  Will watch your condition.  Will get help right away if you are not doing well or get worse. Document Released: 09/02/2000 Document Revised: 01/20/2014 Document Reviewed: 04/03/2012 [facility] Patient Information 2015 [place], [place]. This information is not intended to replace advice given to you by your health care provider. Make sure you discuss any questions you have with your health care provider.

## 2014-10-01 NOTE — Assessment & Plan Note (Signed)
His blood sugars are not well controlled Will send a referral to ENDO for further evaluation

## 2014-10-01 NOTE — Progress Notes (Signed)
Subjective:    Patient ID: [name], male    DOB: [birth year], 57 y.o.   MRN: [id number]  Arthritis Presents for follow-up visit. He complains of pain and joint warmth. He reports no stiffness or joint swelling. Affected locations include the right hip. His pain is at a severity of 7/10. Associated symptoms include pain at night and pain while resting. Pertinent negatives include no diarrhea, dry eyes, dry mouth, dysuria, fatigue, fever, rash, Raynaud's syndrome, uveitis or weight loss. His past medical history is significant for osteoarthritis. His pertinent risk factors include overuse. Past treatments include acetaminophen and an opioid. The treatment provided moderate relief. Factors aggravating his arthritis include activity. Compliance with prior treatments has been variable.      Review of Systems  Constitutional: Negative.  Negative for fever, weight loss and fatigue.  HENT: Negative.   Eyes: Negative.   Respiratory: Negative.  Negative for cough, chest tightness, shortness of breath and stridor.   Cardiovascular: Negative.  Negative for chest pain, palpitations and leg swelling.  Gastrointestinal: Negative.  Negative for abdominal pain and diarrhea.  Endocrine: Negative.  Negative for polydipsia, polyphagia and polyuria.  Genitourinary: Negative.  Negative for dysuria and decreased urine volume.  Musculoskeletal: Positive for arthralgias and arthritis. Negative for back pain, joint swelling and stiffness.  Skin: Negative.  Negative for rash.  Allergic/Immunologic: Negative.   Neurological: Negative.   Hematological: Negative.  Negative for adenopathy. Does not bruise/bleed easily.  Psychiatric/Behavioral: Negative.   All other systems reviewed and are negative.      Objective:   Physical Exam  Constitutional: He is oriented to person, place, and time. He appears well-developed and well-nourished. No distress.  HENT:  Head: Normocephalic and atraumatic.  Mouth/Throat:  Oropharynx is clear and moist. No oropharyngeal exudate.  Eyes: Conjunctivae are normal. Right eye exhibits no discharge. Left eye exhibits no discharge. No scleral icterus.  Neck: Normal range of motion. Neck supple. No JVD present. No tracheal deviation present. No thyromegaly present.  Cardiovascular: Normal rate, regular rhythm, normal heart sounds and intact distal pulses.  Exam reveals no gallop and no friction rub.   No murmur heard. Pulmonary/Chest: Effort normal and breath sounds normal. No stridor. No respiratory distress. He has no wheezes. He has no rales. He exhibits no tenderness.  Abdominal: Soft. Bowel sounds are normal. He exhibits no distension and no mass. There is no tenderness. There is no rebound and no guarding.  Musculoskeletal: Normal range of motion. He exhibits edema (trace edema in BLE). He exhibits no tenderness.  Lymphadenopathy:    He has no cervical adenopathy.  Neurological: He is oriented to person, place, and time.  Skin: Skin is warm and dry. No rash noted. He is not diaphoretic. No erythema. No pallor.  Vitals reviewed.     Lab Results  Component Value Date   WBC 7.2 08/11/2014   HGB 10.4* 08/11/2014   HCT 33.8* 08/11/2014   PLT 277.0 08/11/2014   GLUCOSE 241* 08/11/2014   CHOL 188 04/30/2014   TRIG 79.0 04/30/2014   HDL 36.60* 04/30/2014   LDLDIRECT 136.8 12/12/2006   LDLCALC 136* 04/30/2014   ALT 16 12/17/2013   AST 18 12/17/2013   NA 140 08/11/2014   K 4.4 08/11/2014   CL 103 08/11/2014   CREATININE 1.7* 08/11/2014   BUN 40* 08/11/2014   CO2 24 08/11/2014   TSH 2.00 08/11/2014   PSA 0.92 08/11/2014   HGBA1C 10.1* 08/11/2014   MICROALBUR 4.7* 05/07/2013  Assessment & Plan:

## 2014-10-02 ENCOUNTER — Telehealth: Payer: Self-pay | Admitting: Internal Medicine

## 2014-10-02 NOTE — Telephone Encounter (Signed)
emmi mailed  °

## 2014-10-07 ENCOUNTER — Telehealth: Payer: Self-pay | Admitting: Internal Medicine

## 2014-10-07 MED ORDER — GLUCOSE BLOOD VI STRP: 1.0000 | ORAL_STRIP | Freq: Three times a day (TID) | Status: DC

## 2014-10-07 NOTE — Telephone Encounter (Signed)
Faxed script to [facility]...Johny Chess

## 2014-10-07 NOTE — Telephone Encounter (Signed)
Pt called requesting refill on test strips    [facility] [address]

## 2014-10-14 ENCOUNTER — Other Ambulatory Visit: Payer: Self-pay

## 2014-10-14 DIAGNOSIS — E1022 Type 1 diabetes mellitus with diabetic chronic kidney disease: Secondary | ICD-10-CM

## 2014-10-14 MED ORDER — GLUCOSE BLOOD VI STRP: ORAL_STRIP | Status: DC

## 2014-10-14 MED ORDER — ULTICARE SHORT PEN NEEDLES 31G X 8 MM MISC: Status: DC

## 2014-10-28 ENCOUNTER — Encounter: Payer: Self-pay | Admitting: Family Medicine

## 2014-10-28 ENCOUNTER — Ambulatory Visit (INDEPENDENT_AMBULATORY_CARE_PROVIDER_SITE_OTHER): Payer: Commercial Managed Care - HMO | Admitting: Family Medicine

## 2014-10-28 VITALS — BP 122/70 | HR 77 | Ht 72.0 in | Wt >= 6400 oz

## 2014-10-28 DIAGNOSIS — M1611 Unilateral primary osteoarthritis, right hip: Secondary | ICD-10-CM

## 2014-10-28 NOTE — Assessment & Plan Note (Signed)
Discussed with patient at great length. Due to patient's body habitus he would not be a surgical candidate but the only curative measure will be him having a hip replacement. Patient is motivated to make changes. We discussed with him at this time that I do think formal physical therapy will be beneficial but we'll try aquatic therapy secondary to patient's body habitus. We discussed icing regimen, as well as over-the-counter medications patient should take. We discussed any interaction with other medications including his pain medications and when not to take them together. We discussed what patient should do at home as well as proper shoe choices. Patients can try to make these changes and come back and see me in 4 weeks. I am not optimistic he will be pain-free until patient has a hip replacement. I am hoping for approximately 20-30% improvement. Patient was made aware of this.

## 2014-10-28 NOTE — Progress Notes (Signed)
  [name] [place], [place] [address] Phone: [phone] Subjective:    I'm seeing this patient by the request  of:  Scarlette Calico, MD   CC: right hip pain/.   [id number] [name] is a 59 y.o. male coming in with complaint of right hip pain.  Patient has had this pain for multiple years. Patient has had 2 surgeries on his right hip previously. Patient states in 44 and was well is a 75. Patient states over the course of time he has had his weight fluctuate. At this moment he has a significant amount of weight. Patient states that when he does significant activity or when he goes from sitting to standing position he has severe right groin pain. States that it is difficult to workout though secondary to the discomfort as well. Patient is on a pain medication at this time. Patient is trying water aerobics which has been somewhat beneficial. Patient continues to try to lose weight and monitor his diet. Patient denies any radiation down the legs or any numbness or tingling.  Patient's x-rays were reviewed by me. Patient is having no loosening seen of the fixation. Patient though does have severe bone-on-bone osteophytic changes of the hip.     Past medical history, social, surgical and family history all reviewed in electronic medical record.   Review of Systems: No headache, visual changes, nausea, vomiting, diarrhea, constipation, dizziness, abdominal pain, skin rash, fevers, chills, night sweats, weight loss, swollen lymph nodes, body aches, joint swelling, muscle aches, chest pain, shortness of breath, mood changes.   Objective Blood pressure 122/70, pulse 77, height 6' (1.829 m), weight 475 lb (215.459 kg), SpO2 94 %.  General: No apparent distress alert and oriented x3 mood and affect normal, dressed appropriately. Severely obese HEENT: Pupils equal, extraocular movements intact  Respiratory: Patient's speak in full sentences and does  not appear short of breath  Cardiovascular: No lower extremity edema, non tender, no erythema  Skin: Warm dry intact with no signs of infection or rash on extremities or on axial skeleton.  Abdomen: Soft nontender  Neuro: Cranial nerves II through XII are intact, neurovascularly intact in all extremities with 2+ DTRs and 2+ pulses.  Lymph: No lymphadenopathy of posterior or anterior cervical chain or axillae bilaterally.  Gait normal with good balance and coordination.  MSK:  Non tender with full range of motion and good stability and symmetric strength and tone of shoulders, elbows, wrist, , knee and ankles bilaterally. Some range of motion is limited secondary to patient's body habitus.  Hip: Right ROM IR: 20 Deg, ER: 15 Deg, Flexion: 70 Deg, Extension: 20 Deg, Abduction: 15 Deg, Adduction: 15 Deg Strength IR: 3/5, ER: 3/5, Flexion: 3/5, Extension: 4/5, Abduction: 3/5, Adduction: 3/5 compared to 4-5 strength on contralateral hip Pelvic alignment unremarkable to inspection and palpation. Standing hip rotation and gait without trendelenburg sign / unsteadiness. Greater trochanter without tenderness to palpation. No tenderness over piriformis and greater trochanter. Positive pain with FABER and FADIR. No SI joint tenderness and normal minimal SI movement. Patient does have some limitation of range of motion with left hip but not as extreme and no significant weakness.   Impression and Recommendations:     This case required medical decision making of moderate complexity.

## 2014-10-28 NOTE — Patient Instructions (Signed)
Nice to meet you Ice 20 minutes 2 times daily. Usually after activity and before bed. Physical therapy will be calling you.  Take tylenol 650 mg three times a day is the best evidence based medicine we have for arthritis.  If too much pain then use the pain killer but not both at the same time.  Glucosamine sulfate 1500mg  a day is a supplement that has been shown to help moderate to severe arthritis. Vitamin D 2000 IU daily Fish oil 2 grams daily.  Tumeric 500mg  twice daily.  Continue the water aerobics. It's important that you continue to stay active. Controlling your weight is important.  Water aerobics and cycling with low resistance are the best two types of exercise for arthritis.  Come back and see me in 4 weeks.

## 2014-10-28 NOTE — Progress Notes (Signed)
Pre visit review using our clinic review tool, if applicable. No additional management support is needed unless otherwise documented below in the visit note. 

## 2014-10-31 DIAGNOSIS — G4733 Obstructive sleep apnea (adult) (pediatric): Secondary | ICD-10-CM | POA: Diagnosis not present

## 2014-11-04 NOTE — Telephone Encounter (Signed)
error 

## 2014-11-06 ENCOUNTER — Encounter: Payer: Self-pay | Admitting: Internal Medicine

## 2014-11-10 ENCOUNTER — Other Ambulatory Visit: Payer: Self-pay | Admitting: Internal Medicine

## 2014-11-27 ENCOUNTER — Ambulatory Visit: Payer: Commercial Managed Care - HMO | Admitting: Pulmonary Disease

## 2014-12-25 ENCOUNTER — Other Ambulatory Visit: Payer: Self-pay | Admitting: Internal Medicine

## 2014-12-30 ENCOUNTER — Encounter: Payer: Self-pay | Admitting: Internal Medicine

## 2014-12-30 DIAGNOSIS — H52203 Unspecified astigmatism, bilateral: Secondary | ICD-10-CM | POA: Diagnosis not present

## 2014-12-30 DIAGNOSIS — H2513 Age-related nuclear cataract, bilateral: Secondary | ICD-10-CM | POA: Diagnosis not present

## 2014-12-30 DIAGNOSIS — E11351 Type 2 diabetes mellitus with proliferative diabetic retinopathy with macular edema: Secondary | ICD-10-CM | POA: Diagnosis not present

## 2014-12-30 DIAGNOSIS — H1789 Other corneal scars and opacities: Secondary | ICD-10-CM | POA: Diagnosis not present

## 2014-12-30 LAB — HM DIABETES EYE EXAM

## 2014-12-31 ENCOUNTER — Encounter: Payer: Self-pay | Admitting: Internal Medicine

## 2014-12-31 ENCOUNTER — Other Ambulatory Visit (INDEPENDENT_AMBULATORY_CARE_PROVIDER_SITE_OTHER): Payer: Commercial Managed Care - HMO

## 2014-12-31 ENCOUNTER — Ambulatory Visit (INDEPENDENT_AMBULATORY_CARE_PROVIDER_SITE_OTHER): Payer: Commercial Managed Care - HMO | Admitting: Internal Medicine

## 2014-12-31 VITALS — BP 118/72 | HR 83 | Temp 98.7°F | Resp 16 | Ht 72.0 in | Wt >= 6400 oz

## 2014-12-31 DIAGNOSIS — N189 Chronic kidney disease, unspecified: Secondary | ICD-10-CM | POA: Diagnosis not present

## 2014-12-31 DIAGNOSIS — I10 Essential (primary) hypertension: Secondary | ICD-10-CM | POA: Diagnosis not present

## 2014-12-31 DIAGNOSIS — R1013 Epigastric pain: Secondary | ICD-10-CM | POA: Diagnosis not present

## 2014-12-31 DIAGNOSIS — E038 Other specified hypothyroidism: Secondary | ICD-10-CM

## 2014-12-31 DIAGNOSIS — E1022 Type 1 diabetes mellitus with diabetic chronic kidney disease: Secondary | ICD-10-CM | POA: Diagnosis not present

## 2014-12-31 DIAGNOSIS — R6881 Early satiety: Secondary | ICD-10-CM

## 2014-12-31 LAB — URINALYSIS, ROUTINE W REFLEX MICROSCOPIC
BILIRUBIN URINE: NEGATIVE
HGB URINE DIPSTICK: NEGATIVE
KETONES UR: NEGATIVE
LEUKOCYTES UA: NEGATIVE
Nitrite: NEGATIVE
Specific Gravity, Urine: 1.01 (ref 1.000–1.030)
Total Protein, Urine: NEGATIVE
URINE GLUCOSE: NEGATIVE
UROBILINOGEN UA: 0.2 (ref 0.0–1.0)
pH: 5.5 (ref 5.0–8.0)

## 2014-12-31 LAB — COMPREHENSIVE METABOLIC PANEL
ALBUMIN: 3.6 g/dL (ref 3.5–5.2)
ALT: 18 U/L (ref 0–53)
AST: 19 U/L (ref 0–37)
Alkaline Phosphatase: 183 U/L — ABNORMAL HIGH (ref 39–117)
BUN: 28 mg/dL — ABNORMAL HIGH (ref 6–23)
CO2: 29 mEq/L (ref 19–32)
CREATININE: 1.48 mg/dL (ref 0.40–1.50)
Calcium: 9.2 mg/dL (ref 8.4–10.5)
Chloride: 101 mEq/L (ref 96–112)
GFR: 62.97 mL/min (ref >60.00)
Glucose, Bld: 238 mg/dL — ABNORMAL HIGH (ref 70–99)
Potassium: 4.1 mEq/L (ref 3.5–5.1)
Sodium: 137 mEq/L (ref 135–145)
Total Bilirubin: 0.3 mg/dL (ref 0.2–1.2)
Total Protein: 7.9 g/dL (ref 6.0–8.3)

## 2014-12-31 LAB — CBC WITH DIFFERENTIAL/PLATELET
Basophils Absolute: 0 10*3/uL (ref 0.0–0.1)
Basophils Relative: 0.3 % (ref 0.0–3.0)
EOS ABS: 0.2 10*3/uL (ref 0.0–0.7)
Eosinophils Relative: 3.7 % (ref 0.0–5.0)
HCT: 32 % — ABNORMAL LOW (ref 39.0–52.0)
HEMOGLOBIN: 10.4 g/dL — AB (ref 13.0–17.0)
Lymphocytes Relative: 33.4 % (ref 12.0–46.0)
Lymphs Abs: 2.2 10*3/uL (ref 0.7–4.0)
MCHC: 32.6 g/dL (ref 30.0–36.0)
MCV: 80.8 fl (ref 78.0–100.0)
Monocytes Absolute: 0.4 10*3/uL (ref 0.1–1.0)
Monocytes Relative: 6.1 % (ref 3.0–12.0)
NEUTROS ABS: 3.7 10*3/uL (ref 1.4–7.7)
Neutrophils Relative %: 56.5 % (ref 43.0–77.0)
Platelets: 270 10*3/uL (ref 150.0–400.0)
RBC: 3.96 Mil/uL — AB (ref 4.22–5.81)
RDW: 15.9 % — ABNORMAL HIGH (ref 11.5–15.5)
WBC: 6.5 10*3/uL (ref 4.0–10.5)

## 2014-12-31 LAB — HEMOGLOBIN A1C: Hgb A1c MFr Bld: 10.6 % — ABNORMAL HIGH (ref 4.6–6.5)

## 2014-12-31 LAB — MICROALBUMIN / CREATININE URINE RATIO
CREATININE, U: 64 mg/dL
Microalb Creat Ratio: 4.2 mg/g (ref 0.0–30.0)
Microalb, Ur: 2.7 mg/dL — ABNORMAL HIGH (ref 0.0–1.9)

## 2014-12-31 LAB — LIPASE: LIPASE: 12 U/L (ref 11.0–59.0)

## 2014-12-31 LAB — AMYLASE: Amylase: 47 U/L (ref 27–131)

## 2014-12-31 LAB — TSH: TSH: 1.54 u[IU]/mL (ref 0.35–4.50)

## 2014-12-31 NOTE — Progress Notes (Signed)
Pre visit review using our clinic review tool, if applicable. No additional management support is needed unless otherwise documented below in the visit note. 

## 2014-12-31 NOTE — Patient Instructions (Signed)

## 2014-12-31 NOTE — Progress Notes (Signed)
Subjective:    Patient ID: [name], male    DOB: [birth year], 59 y.o.   MRN: [id number]  Abdominal Pain This is a new problem. The current episode started in the past 7 days. The onset quality is gradual. The problem occurs intermittently. The problem has been unchanged. The pain is located in the epigastric region. The pain is at a severity of 2/10. The pain is mild. The quality of the pain is aching. The abdominal pain does not radiate. Associated symptoms include anorexia (and early satiety). Pertinent negatives include no arthralgias, belching, constipation, diarrhea, dysuria, fever, flatus, frequency, headaches, hematochezia, hematuria, melena, myalgias, nausea, vomiting or weight loss.      Review of Systems  Constitutional: Negative.  Negative for fever, chills, weight loss, diaphoresis, appetite change and fatigue.  HENT: Negative.   Eyes: Negative.   Respiratory: Positive for shortness of breath. Negative for cough, choking, chest tightness, wheezing and stridor.   Cardiovascular: Negative.  Negative for chest pain, palpitations and leg swelling.  Gastrointestinal: Positive for abdominal pain and anorexia (and early satiety). Negative for nausea, vomiting, diarrhea, constipation, blood in stool, melena, hematochezia, abdominal distention, anal bleeding, rectal pain and flatus.  Endocrine: Negative.   Genitourinary: Negative.  Negative for dysuria, urgency, frequency, hematuria, flank pain, decreased urine volume and difficulty urinating.  Musculoskeletal: Negative.  Negative for myalgias and arthralgias.  Skin: Negative.  Negative for rash.  Allergic/Immunologic: Negative.   Neurological: Negative.  Negative for dizziness, tremors, weakness, light-headedness and headaches.  Hematological: Negative.  Negative for adenopathy. Does not bruise/bleed easily.  Psychiatric/Behavioral: Negative.        Objective:   Physical Exam  Constitutional: He is oriented to person, place,  and time. He appears well-developed and well-nourished.  Non-toxic appearance. He does not have a sickly appearance. He does not appear ill. No distress.  HENT:  Head: Normocephalic and atraumatic.  Mouth/Throat: Oropharynx is clear and moist. No oropharyngeal exudate.  Eyes: Conjunctivae are normal. Right eye exhibits no discharge. Left eye exhibits no discharge. No scleral icterus.  Neck: Normal range of motion. Neck supple. No JVD present. No tracheal deviation present. No thyromegaly present.  Cardiovascular: Normal rate, regular rhythm, normal heart sounds and intact distal pulses.  Exam reveals no gallop and no friction rub.   No murmur heard. Pulmonary/Chest: Effort normal and breath sounds normal. No stridor. No respiratory distress. He has no wheezes. He has no rales. He exhibits no tenderness.  Abdominal: Soft. Normal appearance and bowel sounds are normal. He exhibits no shifting dullness, no distension, no pulsatile liver, no fluid wave, no abdominal bruit, no ascites, no pulsatile midline mass and no mass. There is no hepatosplenomegaly, splenomegaly or hepatomegaly. There is tenderness in the epigastric area. There is no rigidity, no rebound, no guarding, no CVA tenderness, no tenderness at McBurney's point and negative Murphy's sign. No hernia. Hernia confirmed negative in the ventral area, confirmed negative in the right inguinal area and confirmed negative in the left inguinal area.    Musculoskeletal: Normal range of motion. He exhibits no edema or tenderness.  Lymphadenopathy:    He has no cervical adenopathy.  Neurological: He is oriented to person, place, and time.  Skin: Skin is warm and dry. No rash noted. He is not diaphoretic. No erythema. No pallor.  Psychiatric: He has a normal mood and affect. His behavior is normal. Judgment and thought content normal.  Vitals reviewed.    Lab Results  Component Value Date   WBC 7.2  08/11/2014   HGB 10.4* 08/11/2014   HCT 33.8*  08/11/2014   PLT 277.0 08/11/2014   GLUCOSE 241* 08/11/2014   CHOL 188 04/30/2014   TRIG 79.0 04/30/2014   HDL 36.60* 04/30/2014   LDLDIRECT 136.8 12/12/2006   LDLCALC 136* 04/30/2014   ALT 16 12/17/2013   AST 18 12/17/2013   NA 140 08/11/2014   K 4.4 08/11/2014   CL 103 08/11/2014   CREATININE 1.7* 08/11/2014   BUN 40* 08/11/2014   CO2 24 08/11/2014   TSH 2.00 08/11/2014   PSA 0.92 08/11/2014   HGBA1C 10.1* 08/11/2014   MICROALBUR 4.7* 05/07/2013       Assessment & Plan:

## 2015-01-01 ENCOUNTER — Encounter: Payer: Self-pay | Admitting: Internal Medicine

## 2015-01-01 NOTE — Assessment & Plan Note (Signed)
GI referral to see if upper endoscopy is needed

## 2015-01-01 NOTE — Assessment & Plan Note (Signed)
His TSh is in the normal range Will stay on the current dose of synthroid

## 2015-01-01 NOTE — Assessment & Plan Note (Signed)
He does not appear to have an acute abd process, will check labs to screen for hepatitis, pancreatitis, UTI, renal stones, etc Will check plain films to see if there is free air, ileus, sbo, stool retention, etc. I have asked him to see GI to see if upper endoscopy is indicated in light of the the presence of a non-functioning Lap Band

## 2015-01-01 NOTE — Assessment & Plan Note (Signed)
His A1C is 10.6% Will refer him back to ENDO for further evaluation

## 2015-01-02 ENCOUNTER — Encounter: Payer: Self-pay | Admitting: Endocrinology

## 2015-01-12 ENCOUNTER — Telehealth: Payer: Self-pay | Admitting: Internal Medicine

## 2015-01-12 ENCOUNTER — Other Ambulatory Visit: Payer: Self-pay | Admitting: Internal Medicine

## 2015-01-12 DIAGNOSIS — S9782XA Crushing injury of left foot, initial encounter: Secondary | ICD-10-CM

## 2015-01-12 NOTE — Telephone Encounter (Signed)
Pt.notified

## 2015-01-12 NOTE — Telephone Encounter (Signed)
Pt called in and is coming tomorrow to get the xray of this stomach.  He has hurt his left foot and knee and wants to know if that order could be added also?  Told him he may need to be seen 1st

## 2015-01-12 NOTE — Telephone Encounter (Signed)
Foot xray added

## 2015-01-13 ENCOUNTER — Telehealth: Payer: Self-pay | Admitting: Internal Medicine

## 2015-01-13 DIAGNOSIS — M170 Bilateral primary osteoarthritis of knee: Secondary | ICD-10-CM

## 2015-01-13 DIAGNOSIS — M160 Bilateral primary osteoarthritis of hip: Secondary | ICD-10-CM

## 2015-01-13 MED ORDER — DICLOFENAC SODIUM 1.5 % TD SOLN: 4.0000 [drp] | Freq: Three times a day (TID) | TRANSDERMAL | Status: DC

## 2015-01-13 NOTE — Telephone Encounter (Signed)
done

## 2015-01-13 NOTE — Telephone Encounter (Signed)
Called and advised pt that cream has been called in to pharm

## 2015-01-13 NOTE — Telephone Encounter (Signed)
Patient's physical therapist gave him some samples of a cream, pennsaiz diclofenas sodium topical. He has run out and it worked really for him. He is wondering if he can get a prescription for this. Pharmacy is [facility].

## 2015-01-14 ENCOUNTER — Ambulatory Visit (HOSPITAL_COMMUNITY)
Admission: RE | Admit: 2015-01-14 | Discharge: 2015-01-14 | Disposition: A | Discharge status: Home / Self Care | Payer: Commercial Managed Care - HMO | Source: Ambulatory Visit | Attending: Internal Medicine | Admitting: Internal Medicine

## 2015-01-14 DIAGNOSIS — R1013 Epigastric pain: Secondary | ICD-10-CM | POA: Diagnosis not present

## 2015-01-14 DIAGNOSIS — M79672 Pain in left foot: Secondary | ICD-10-CM | POA: Diagnosis not present

## 2015-01-14 DIAGNOSIS — M19072 Primary osteoarthritis, left ankle and foot: Secondary | ICD-10-CM | POA: Diagnosis not present

## 2015-01-14 DIAGNOSIS — S9782XA Crushing injury of left foot, initial encounter: Secondary | ICD-10-CM

## 2015-01-30 DIAGNOSIS — G4733 Obstructive sleep apnea (adult) (pediatric): Secondary | ICD-10-CM | POA: Diagnosis not present

## 2015-02-02 ENCOUNTER — Telehealth: Payer: Self-pay | Admitting: Internal Medicine

## 2015-02-02 DIAGNOSIS — M1611 Unilateral primary osteoarthritis, right hip: Secondary | ICD-10-CM

## 2015-02-02 MED ORDER — HYDROCODONE-ACETAMINOPHEN 10-325 MG PO TABS: 1.0000 | ORAL_TABLET | Freq: Three times a day (TID) | ORAL | Status: DC | PRN

## 2015-02-02 NOTE — Telephone Encounter (Signed)
Patient is requesting script for hydrocodone.   °

## 2015-02-02 NOTE — Telephone Encounter (Signed)
Patient notified to pick up. Will pick up on Thursday when here for appointment.

## 2015-02-02 NOTE — Telephone Encounter (Signed)
done

## 2015-02-05 ENCOUNTER — Other Ambulatory Visit: Payer: Self-pay

## 2015-02-05 ENCOUNTER — Ambulatory Visit (INDEPENDENT_AMBULATORY_CARE_PROVIDER_SITE_OTHER): Payer: Commercial Managed Care - HMO

## 2015-02-05 VITALS — BP 118/60 | Ht 72.0 in | Wt >= 6400 oz

## 2015-02-05 DIAGNOSIS — Z0000 Encounter for general adult medical examination without abnormal findings: Secondary | ICD-10-CM

## 2015-02-05 DIAGNOSIS — M1611 Unilateral primary osteoarthritis, right hip: Secondary | ICD-10-CM

## 2015-02-05 MED ORDER — HYDROCODONE-ACETAMINOPHEN 10-325 MG PO TABS: 1.0000 | ORAL_TABLET | Freq: Three times a day (TID) | ORAL | Status: DC | PRN

## 2015-02-05 NOTE — Patient Instructions (Addendum)
[name] , Thank you for taking time to come for your Medicare Wellness Visit. I appreciate your ongoing commitment to your health goals. Please review the following plan we discussed and let me know if I can assist you in the future.   These are the goals we discussed: Goals    . Exercise 150 minutes per week (moderate activity)     Goal is to start journaling; food and BS and the amount of insulin Start at the [facility] for pool exercise / 4 times a week ( currently stopped) Weights on 4th day Smoothies with protein q day; juicing   Goal is 300 lbs        This is a list of the screening recommended for you and due dates:  Health Maintenance  Topic Date Due  . HIV Screening  11[birth year]11/1972  . Flu Shot  04/20/2015  . Hemoglobin A1C  07/02/2015  . Complete foot exam   08/12/2015  . Eye exam for diabetics  12/30/2015  . Urine Protein Check  12/31/2015  . Pneumococcal vaccine (2) 03/25/2019  . Tetanus Vaccine  06/22/2021  . Colon Cancer Screening  11/27/2022   Will make apt to see Dr. [name] in July for A1c and fup Thank you for enrolling in [facility]. Please follow the instructions below to securely access your online medical record. MyChart allows you to send messages to your doctor, view your test results, manage appointments, and more.   How Do I Sign Up? 1. In your Internet browser, go to [employer] and enter [email or web address]. 2. Click on the Sign Up Now link in the Sign In box. You will see the New Member Sign Up page. 3. Enter your MyChart Access Code exactly as it appears below. You will not need to use this code after you've completed the sign-up process. If you do not sign up before the expiration date, you must request a new code.  MyChart Access Code: [email or web address] Expires: 03/08/2015  3:08 AM  4. Enter your Social Security Number ([id number]) and Date of Birth (mm/dd/yyyy) as indicated and click Submit. You will be taken to the next sign-up  page. 5. Create a MyChart ID. This will be your MyChart login ID and cannot be changed, so think of one that is secure and easy to remember. 6. Create a MyChart password. You can change your password at any time. 7. Enter your Password Reset Question and Answer. This can be used at a later time if you forget your password.  8. Enter your e-mail address. You will receive e-mail notification when new information is available in [facility]. 9. Click Sign Up. You can now view your medical record.   Additional Information Remember, MyChart is NOT to be used for urgent needs. For medical emergencies, dial 911.     Health Maintenance A healthy lifestyle and preventative care can promote health and wellness.  Maintain regular health, dental, and eye exams.  Eat a healthy diet. Foods like vegetables, fruits, whole grains, low-fat dairy products, and lean protein foods contain the nutrients you need and are low in calories. Decrease your intake of foods high in solid fats, added sugars, and salt. Get information about a proper diet from your health care provider, if necessary.  Regular physical exercise is one of the most important things you can do for your health. Most adults should get at least 150 minutes of moderate-intensity exercise (any activity that increases your heart rate and causes you to sweat) each week. In  addition, most adults need muscle-strengthening exercises on 2 or more days a week.   Maintain a healthy weight. The body mass index (BMI) is a screening tool to identify possible weight problems. It provides an estimate of body fat based on height and weight. Your health care provider can find your BMI and can help you achieve or maintain a healthy weight. For males 20 years and older:  A BMI below 18.5 is considered underweight.  A BMI of 18.5 to 24.9 is normal.  A BMI of 25 to 29.9 is considered overweight.  A BMI of 30 and above is considered obese.  Maintain normal blood  lipids and cholesterol by exercising and minimizing your intake of saturated fat. Eat a balanced diet with plenty of fruits and vegetables. Blood tests for lipids and cholesterol should begin at age 8 and be repeated every 5 years. If your lipid or cholesterol levels are high, you are over age 84, or you are at high risk for heart disease, you may need your cholesterol levels checked more frequently.Ongoing high lipid and cholesterol levels should be treated with medicines if diet and exercise are not working.  If you smoke, find out from your health care provider how to quit. If you do not use tobacco, do not start.  Lung cancer screening is recommended for adults aged 75-80 years who are at high risk for developing lung cancer because of a history of smoking. A yearly low-dose CT scan of the lungs is recommended for people who have at least a 30-pack-year history of smoking and are current smokers or have quit within the past 15 years. A pack year of smoking is smoking an average of 1 pack of cigarettes a day for 1 year (for example, a 30-pack-year history of smoking could mean smoking 1 pack a day for 30 years or 2 packs a day for 15 years). Yearly screening should continue until the smoker has stopped smoking for at least 15 years. Yearly screening should be stopped for people who develop a health problem that would prevent them from having lung cancer treatment.  If you choose to drink alcohol, do not have more than 2 drinks per day. One drink is considered to be 12 oz (360 mL) of beer, 5 oz (150 mL) of wine, or 1.5 oz (45 mL) of liquor.  Avoid the use of street drugs. Do not share needles with anyone. Ask for help if you need support or instructions about stopping the use of drugs.  High blood pressure causes heart disease and increases the risk of stroke. Blood pressure should be checked at least every 1-2 years. Ongoing high blood pressure should be treated with medicines if weight loss and  exercise are not effective.  If you are 39-62 years old, ask your health care provider if you should take aspirin to prevent heart disease.  Diabetes screening involves taking a blood sample to check your fasting blood sugar level. This should be done once every 3 years after age 60 if you are at a normal weight and without risk factors for diabetes. Testing should be considered at a younger age or be carried out more frequently if you are overweight and have at least 1 risk factor for diabetes.  Colorectal cancer can be detected and often prevented. Most routine colorectal cancer screening begins at the age of 36 and continues through age 1. However, your health care provider may recommend screening at an earlier age if you have risk factors for  colon cancer. On a yearly basis, your health care provider may provide home test kits to check for hidden blood in the stool. A small camera at the end of a tube may be used to directly examine the colon (sigmoidoscopy or colonoscopy) to detect the earliest forms of colorectal cancer. Talk to your health care provider about this at age 87 when routine screening begins. A direct exam of the colon should be repeated every 5-10 years through age 42, unless early forms of precancerous polyps or small growths are found.  People who are at an increased risk for hepatitis B should be screened for this virus. You are considered at high risk for hepatitis B if:  You were born in a country where hepatitis B occurs often. Talk with your health care provider about which countries are considered high risk.  Your parents were born in a high-risk country and you have not received a shot to protect against hepatitis B (hepatitis B vaccine).  You have HIV or AIDS.  You use needles to inject street drugs.  You live with, or have sex with, someone who has hepatitis B.  You are a man who has sex with other men (MSM).  You get hemodialysis treatment.  You take certain  medicines for conditions like cancer, organ transplantation, and autoimmune conditions.  Hepatitis C blood testing is recommended for all people born from [birth year] through 1963 and any individual with known risk factors for hepatitis C.  Healthy men should no longer receive prostate-specific antigen (PSA) blood tests as part of routine cancer screening. Talk to your health care provider about prostate cancer screening.  Testicular cancer screening is not recommended for adolescents or adult males who have no symptoms. Screening includes self-exam, a health care provider exam, and other screening tests. Consult with your health care provider about any symptoms you have or any concerns you have about testicular cancer.  Practice safe sex. Use condoms and avoid high-risk sexual practices to reduce the spread of sexually transmitted infections (STIs).  You should be screened for STIs, including gonorrhea and chlamydia if:  You are sexually active and are younger than 24 years.  You are older than 24 years, and your health care provider tells you that you are at risk for this type of infection.  Your sexual activity has changed since you were last screened, and you are at an increased risk for chlamydia or gonorrhea. Ask your health care provider if you are at risk.  If you are at risk of being infected with HIV, it is recommended that you take a prescription medicine daily to prevent HIV infection. This is called pre-exposure prophylaxis (PrEP). You are considered at risk if:  You are a man who has sex with other men (MSM).  You are a heterosexual man who is sexually active with multiple partners.  You take drugs by injection.  You are sexually active with a partner who has HIV.  Talk with your health care provider about whether you are at high risk of being infected with HIV. If you choose to begin PrEP, you should first be tested for HIV. You should then be tested every 3 months for as long as  you are taking PrEP.  Use sunscreen. Apply sunscreen liberally and repeatedly throughout the day. You should seek shade when your shadow is shorter than you. Protect yourself by wearing long sleeves, pants, a wide-brimmed hat, and sunglasses year round whenever you are outdoors.  Tell your health care provider  of new moles or changes in moles, especially if there is a change in shape or color. Also, tell your health care provider if a mole is larger than the size of a pencil eraser.  A one-time screening for abdominal aortic aneurysm (AAA) and surgical repair of large AAAs by ultrasound is recommended for men aged 20-75 years who are current or former smokers.  Stay current with your vaccines (immunizations). Document Released: 03/03/2008 Document Revised: 09/10/2013 Document Reviewed: 01/31/2011 [facility] Patient Information 2015 [place], [place]. This information is not intended to replace advice given to you by your health care provider. Make sure you discuss any questions you have with your health care provider.

## 2015-02-05 NOTE — Progress Notes (Signed)
Subjective:   [name] is a 59 y.o. male who presents for Medicare Annual/Subsequent preventive examination.  Review of Systems:   HRA assessment completed during visit  Health better, the same or worse than last year? Worse Barely walk, can't get it together.  Has OA in right hip/    Health described as excellent; very good; good; fair or poor? poor Barriers;  Current Exercise; trying to go to gym;  Insurance; does pay for gym Medical Nutrition Management ; deferred for now; states he does not think he will go   Current dietary;  Was losing weight: low weight was 405lb/ after abd surgery x 59 yo started to gain weight back One year;  Highest weight was 510 and that was 59 yo. Normal day consist of grits; English muffin; eggs once in awhile; bacon off and on; does not eat a lot of protein Used to do smoothies with kale; spinach; nuts- almonds; beet; when he lost weight Goes to sleep after breakfast; Lunch; If he eats; spaghetti; chicken; stir fry chicken; vegetable and eats the same for dinner  Sugar: doesn't eat or drink carbs; drinking a lot of water Been to endocrinologist; Likes the doctor but doesn't like the office; Office was rude and does not want to go back.   BS 105 this am/ yesterday afternoon; had cake and ice cream;   The patient states he self manages; 240units of Levemir; lately taking 160 units;  Take 160 in am and 80u at 8pm;  If bs are 150 to 160 in the am, he will take the 240u/ if 120 he will take 160u (see telephone note)   Goal 300lb; (167 lbs) Plan: smoothies in the am; He knows this works And JOURNALING: I will talk to Dr. about endo Will go to the gym; water aerobics Motivation 1-10  / at a 2; Coached regarding motivation;   CPAP back on / hopefully will help  Transportation; [place]; doesn't drive due to cataracts   Stresses 1 -10; not stressed (2)  Vision had eye exam in this year; last month Cataract;  Dr. Lady Gary  ophthamology  Has church family now; Forensic psychologist; has asked him to journal as well   Dental: dentures  Falls; had one but as fine Problem w knees but no falls Has walker with seat  Gave information on safety to take home;    Current Care Team reviewed and updated Dr. [name] Dr. Loanne Drilling, [place] Dr. Marikay Alar, [place], for stomach    Cardiac Risk Factors include: advanced age (>62men, >52 women);diabetes mellitus;dyslipidemia;male gender     Objective:    Vitals: BP 118/60 mmHg  Ht 6' (1.829 m)  Wt 467 lb 4 oz (211.943 kg)  BMI 63.36 kg/m2  Tobacco History  Smoking status  . Never Smoker   Smokeless tobacco  . Never Used     Counseling given: Not Answered   Past Medical History  Diagnosis Date  . Diabetes mellitus     Has Hx of diabetic foot ulcer & peripheral neuropathy  . Hyperlipidemia   . HTN (hypertension)   . Morbid obesity   . History of PFTs 05/2009    Mild Obstructive defect  . OSA on CPAP   . Anemia   . Colon cancer 2007    s/p sigmoid colectomy  . CHF (congestive heart failure)     Presumed diastolic. Echo (06/07) w.EF 45%, severe posterior HK, mild LV hypertrophy, No further work-up of abnormal echo was done.  Past Surgical History  Procedure Laterality Date  . Sigmoid colectomy  10/2005    [facility]  . Corneal transplant  [facility]    '80/Right  '84/Left  . Bariatric surgery  12/2009    Lap Band/At [facility]  . Hip surgery      bilateral  . Colonoscopy  multiple  . Tonsillectomy    . Colonoscopy with propofol N/A 11/26/2012    Procedure: COLONOSCOPY WITH PROPOFOL;  Surgeon: Gatha Mayer, MD;  Location: [facility];  Service: Endoscopy;  Laterality: N/A;  may need pre appt. with anesthesia due to morbid obesity  . Incisional hernia repair N/A 12/18/2013    Procedure:  REPAIR OF INCARCERATED INCISIONAL HERNIA;  Surgeon: Rolm Bookbinder, MD;  Location: [place];  Service: General;  Laterality: N/A;  . Insertion of mesh N/A 12/18/2013     Procedure: INSERTION OF MESH;  Surgeon: Rolm Bookbinder, MD;  Location: [facility] OR;  Service: General;  Laterality: N/A;   Family History  Problem Relation Age of Onset  . Hepatitis Mother     hepatitis C  . Heart attack Brother 76  . Cancer Neg Hx     colon or prostate  . Diabetes Mother   . Diabetes Father   . Congestive Heart Failure Mother   . CAD Mother   . Heart failure Brother    History  Sexual Activity  . Sexual Activity: Not Currently    Outpatient Encounter Prescriptions as of 02/05/2015  Medication Sig  . cholecalciferol (VITAMIN D) 1000 UNITS tablet Take 1,000 Units by mouth daily.  Marland Kitchen zinc gluconate 50 MG tablet Take 50 mg by mouth daily.  Marland Kitchen aspirin EC 325 MG tablet Take 325 mg by mouth every evening.   Marland Kitchen atorvastatin (LIPITOR) 40 MG tablet TAKE 1 TABLET (40 MG TOTAL) BY MOUTH DAILY.  . bisoprolol (ZEBETA) 10 MG tablet Take 1 tablet (10 mg total) by mouth every evening.  . cycloSPORINE (RESTASIS) 0.05 % ophthalmic emulsion Place 1 drop into both eyes 2 (two) times daily.  . Diclofenac Sodium (PENNSAID) 1.5 % SOLN Place 0.2 mLs onto the skin 3 (three) times daily.  . ferrous sulfate 325 (65 FE) MG tablet Take 325 mg by mouth 2 (two) times daily with a meal.  . ferrous sulfate 325 (65 FE) MG tablet TAKE 1 TABLET BY MOUTH 2 TIMES DAILY WITH A MEAL.  . furosemide (LASIX) 40 MG tablet Take 1 tablet (40 mg total) by mouth 2 (two) times daily.  Marland Kitchen glucose blood (ONETOUCH VERIO) test strip Use to check blood sugars three times day  Dx E10.9  . insulin detemir (LEVEMIR) 100 UNIT/ML injection Inject 2.4 mLs (240 Units total) into the skin every morning.  Marland Kitchen LEVEMIR FLEXTOUCH 100 UNIT/ML Pen   . levothyroxine (SYNTHROID, LEVOTHROID) 50 MCG tablet Take 50 mcg by mouth daily before breakfast.  . levothyroxine (SYNTHROID, LEVOTHROID) 50 MCG tablet TAKE 1 TABLET BY MOUTH DAILY BEFORE BREAKFAST.  Marland Kitchen losartan (COZAAR) 25 MG tablet Take 1 tablet (25 mg total) by mouth daily.  . Multiple  Vitamins-Minerals (ZINC PO) Take by mouth.  . polyethylene glycol powder (GLYCOLAX/MIRALAX) powder Take 17 g by mouth daily as needed for mild constipation.  Marland Kitchen ULTICARE SHORT PEN NEEDLES 31G X 8 MM MISC Dx E10.9 Use one pen needle once daily with insulin  . vitamin C (ASCORBIC ACID) 500 MG tablet Take 1,000 mg by mouth 2 (two) times daily.   . vitamin E 200 UNIT capsule Take 400 Units by  mouth every morning.   Marland Kitchen ZETIA 10 MG tablet Take 1 tablet (10 mg total) by mouth daily.  . [DISCONTINUED] HYDROcodone-acetaminophen (NORCO) 10-325 MG per tablet Take 1 tablet by mouth every 8 (eight) hours as needed.   No facility-administered encounter medications on file as of 02/05/2015.    Activities of Daily Living In your present state of health, do you have any difficulty performing the following activities: 02/05/2015  Hearing? N  Vision? N  Difficulty concentrating or making decisions? N  Walking or climbing stairs? Y  Dressing or bathing? N  Doing errands, shopping? N  Preparing Food and eating ? N  Using the Toilet? N  In the past six months, have you accidently leaked urine? N  Do you have problems with loss of bowel control? N  Managing your Medications? N  Managing your Finances? N  Housekeeping or managing your Housekeeping? N    Patient Care Team: [name], MD as PCP - General (Internal Medicine) Gatha Mayer, MD (Gastroenterology)   Assessment:  Objective    BMI: 63.3; educated on heart disease and how weight strains the heart, as well as Diabetes  Understand risk and lifestyle choices than can impede health; obesity long term causes more disaility   Personalized Education given regarding: Motivation and plan   Pt determined a personalized goal; patient will journal and agreed to journal food as well  Assessment included:   Taking meds without issues; no barriers identified Stress: Recommendations for managing stress if assessed as a factor;  No Risk for  hepatitis or high risk social behavior identified via hepatitis screen   Safety issues reviewed(driving issues; vision; home environment; support and environmental safety)  Cognition assessed by AD8; Score 0 (A score of 2 or greater would indicate the MMSE be completed)    Need for Immunizations or other screenings identified; no screens overdue Was not a risk for HIV per the patient review of risk  (CDC recommmend Prevnar at 65 followed by pnuemovax 23 in one year or 5 years after the last dose.  Health Maintenance up to date and a Preventive Wellness Plan was given to the patient    Exercise Activities and Dietary recommendations    Goals    . Exercise 150 minutes per week (moderate activity)     Goal is to start journaling; food and BS and the amount of insulin [facility] at the [facility] for pool exercise / 4 times a week ( currently stopped) Weights on 4th day Smoothies with protein q day; juicing   Goal is 300 lbs       Fall Risk Fall Risk  02/05/2015  Falls in the past year? Yes  Number falls in past yr: 1  Injury with Fall? No  Risk for fall due to : Impaired balance/gait;Impaired mobility;Impaired vision   Depression Screen PHQ 2/9 Scores 02/05/2015  PHQ - 2 Score 0    Cognitive Testing MMSE - Mini Mental State Exam 02/05/2015  Not completed: (No Data)    Immunization History  Administered Date(s) Administered  . Pneumococcal Polysaccharide-23 03/24/2014  . Tdap 06/23/2011   Screening Tests Health Maintenance  Topic Date Due  . HIV Screening  11/11/1970  . INFLUENZA VACCINE  04/20/2015  . HEMOGLOBIN A1C  07/02/2015  . FOOT EXAM  08/12/2015  . OPHTHALMOLOGY EXAM  12/30/2015  . URINE MICROALBUMIN  12/31/2015  . PNEUMOCOCCAL POLYSACCHARIDE VACCINE (2) 03/25/2019  . TETANUS/TDAP  06/22/2021  . COLONOSCOPY  11/27/2022  Plan:     Plan   The patient agrees to: Made apt to see Dr. [name] in July for A1c check and continued weight counseling Agreed to  journal diet and start making smoothies with kale, spinach, beets, almonds etc.  Declined Medical Nutrition Management for now [facility]  Advanced directive: Given information with AD form; will discuss with his Preacher; as he likes him and stated today that he would want him to make any Health care decisions in his behalf if he was unable to do so.    During the course of the visit the patient was educated and counseled about the following appropriate screening and preventive services:   Vaccines to include Pneumoccal, Influenza, Hepatitis B, Td, Zostavax, HCV/ HIV postponed, no risk identified this visit  Electrocardiogram/ deferred  Cardiovascular Disease; educated   Colorectal cancer screening; completed  Diabetes screening; discussed >10  Prostate Cancer Screening/ deferred  Glaucoma screening/ Just in to [place] [facility] / fup for cataracts; does not drive  Nutrition counseling given/ will start smoothies  Smoking cessation counseling / not applicable  Patient Instructions (the written plan) was given to the patient.    Wynetta Fines, RN  02/05/2015   Medical screening examination/treatment/procedure(s) were performed by non-physician practitioner and as supervising physician I was immediately available for consultation/collaboration. I agree with above. Scarlette Calico, MD

## 2015-02-09 ENCOUNTER — Telehealth: Payer: Self-pay | Admitting: Internal Medicine

## 2015-02-09 ENCOUNTER — Ambulatory Visit: Payer: Commercial Managed Care - HMO | Admitting: Internal Medicine

## 2015-02-09 NOTE — Telephone Encounter (Signed)
No charge. 

## 2015-03-26 ENCOUNTER — Ambulatory Visit: Payer: Commercial Managed Care - HMO | Admitting: Internal Medicine

## 2015-06-01 ENCOUNTER — Telehealth: Payer: Self-pay | Admitting: *Deleted

## 2015-06-01 NOTE — Telephone Encounter (Signed)
He needs an appt 

## 2015-06-01 NOTE — Telephone Encounter (Signed)
Pt requesting refill on his hydrocodone...[name]

## 2015-06-01 NOTE — Telephone Encounter (Signed)
Notified pt with md response. appt made for 06/04/15...[name]

## 2015-06-04 ENCOUNTER — Encounter: Payer: Self-pay | Admitting: Internal Medicine

## 2015-06-04 ENCOUNTER — Ambulatory Visit (INDEPENDENT_AMBULATORY_CARE_PROVIDER_SITE_OTHER): Payer: Commercial Managed Care - HMO | Admitting: Internal Medicine

## 2015-06-04 VITALS — BP 130/82 | HR 61 | Temp 97.8°F | Resp 16 | Ht 72.0 in | Wt >= 6400 oz

## 2015-06-04 DIAGNOSIS — M5440 Lumbago with sciatica, unspecified side: Secondary | ICD-10-CM

## 2015-06-04 DIAGNOSIS — E785 Hyperlipidemia, unspecified: Secondary | ICD-10-CM | POA: Diagnosis not present

## 2015-06-04 DIAGNOSIS — I10 Essential (primary) hypertension: Secondary | ICD-10-CM

## 2015-06-04 DIAGNOSIS — E108 Type 1 diabetes mellitus with unspecified complications: Secondary | ICD-10-CM

## 2015-06-04 DIAGNOSIS — E1021 Type 1 diabetes mellitus with diabetic nephropathy: Secondary | ICD-10-CM

## 2015-06-04 DIAGNOSIS — E038 Other specified hypothyroidism: Secondary | ICD-10-CM

## 2015-06-04 DIAGNOSIS — D509 Iron deficiency anemia, unspecified: Secondary | ICD-10-CM

## 2015-06-04 MED ORDER — HYDROCODONE BITARTRATE ER 20 MG PO T24A: 1.0000 | EXTENDED_RELEASE_TABLET | Freq: Every day | ORAL | Status: DC

## 2015-06-04 NOTE — Progress Notes (Signed)
Pre visit review using our clinic review tool, if applicable. No additional management support is needed unless otherwise documented below in the visit note. 

## 2015-06-04 NOTE — Progress Notes (Signed)
Subjective:  Patient ID: [name], male    DOB: 1955/11/22  Age: 59 y.o. MRN: [id number]  CC: Hypertension; Hypothyroidism; Hyperlipidemia; Diabetes; Osteoarthritis; and Back Pain   HPI [name] presents for follow-up and discuss obtaining a motorized wheelchair. He suffers from congestive heart failure, severe peripheral diabetic neuropathy, degenerative joint disease, low back pain, and leg and foot ulcers. This impairs his ability to perform his activities of daily living like toileting, dressing, grooming, and bathing in his home. A a cane, walker, and/or crutch will not resolve the issue with performing his activities of daily living. He is already using a 4 pronged walker. A motorized wheelchair will allow the patient to safely perform his daily activities. He will self propel the wheelchair while engaging in frequent activities such as laundry, toileting, bathing, and meals. This cannot be performed in a standard or light weight wheelchair due to the weight of the chair.  He also complains of pain today. He has chronic back and joint pain. He previously tried [employer] but doesn't like taking Tylenol because he is concerned it may affect his liver.  Outpatient Prescriptions Prior to Visit  Medication Sig Dispense Refill  . aspirin EC 325 MG tablet Take 325 mg by mouth every evening.     Marland Kitchen atorvastatin (LIPITOR) 40 MG tablet TAKE 1 TABLET (40 MG TOTAL) BY MOUTH DAILY. 90 tablet 3  . bisoprolol (ZEBETA) 10 MG tablet Take 1 tablet (10 mg total) by mouth every evening. 90 tablet 3  . cholecalciferol (VITAMIN D) 1000 UNITS tablet Take 1,000 Units by mouth daily.    . cycloSPORINE (RESTASIS) 0.05 % ophthalmic emulsion Place 1 drop into both eyes 2 (two) times daily. 0.4 mL 5  . Diclofenac Sodium (PENNSAID) 1.5 % SOLN Place 0.2 mLs onto the skin 3 (three) times daily. 150 mL 11  . ferrous sulfate 325 (65 FE) MG tablet Take 325 mg by mouth 2 (two) times daily with a meal.    . ferrous  sulfate 325 (65 FE) MG tablet TAKE 1 TABLET BY MOUTH 2 TIMES DAILY WITH A MEAL. 60 tablet 5  . furosemide (LASIX) 40 MG tablet Take 1 tablet (40 mg total) by mouth 2 (two) times daily. 180 tablet 3  . glucose blood (ONETOUCH VERIO) test strip Use to check blood sugars three times day  Dx E10.9 100 each 12  . insulin detemir (LEVEMIR) 100 UNIT/ML injection Inject 2.4 mLs (240 Units total) into the skin every morning. 10 mL 11  . LEVEMIR FLEXTOUCH 100 UNIT/ML Pen     . levothyroxine (SYNTHROID, LEVOTHROID) 50 MCG tablet TAKE 1 TABLET BY MOUTH DAILY BEFORE BREAKFAST. 90 tablet 2  . losartan (COZAAR) 25 MG tablet Take 1 tablet (25 mg total) by mouth daily. 90 tablet 3  . Multiple Vitamins-Minerals (ZINC PO) Take by mouth.    . polyethylene glycol powder (GLYCOLAX/MIRALAX) powder Take 17 g by mouth daily as needed for mild constipation. 255 g 11  . ULTICARE SHORT PEN NEEDLES 31G X 8 MM MISC Dx E10.9 Use one pen needle once daily with insulin 100 each 11  . vitamin C (ASCORBIC ACID) 500 MG tablet Take 1,000 mg by mouth 2 (two) times daily.     . vitamin E 200 UNIT capsule Take 400 Units by mouth every morning.     Marland Kitchen ZETIA 10 MG tablet Take 1 tablet (10 mg total) by mouth daily. 90 tablet 3  . zinc gluconate 50 MG tablet Take 50 mg by  mouth daily.    Marland Kitchen HYDROcodone-acetaminophen (NORCO) 10-325 MG per tablet Take 1 tablet by mouth every 8 (eight) hours as needed. 90 tablet 0  . levothyroxine (SYNTHROID, LEVOTHROID) 50 MCG tablet Take 50 mcg by mouth daily before breakfast.     No facility-administered medications prior to visit.    ROS Review of Systems  Constitutional: Positive for fatigue. Negative for fever, chills, diaphoresis and appetite change.  HENT: Negative.   Eyes: Negative.   Respiratory: Negative.  Negative for cough, choking, chest tightness, shortness of breath and stridor.   Cardiovascular: Negative.  Negative for chest pain, palpitations and leg swelling.  Gastrointestinal:  Negative.  Negative for nausea, vomiting, abdominal pain, diarrhea, constipation and blood in stool.  Endocrine: Negative.  Negative for polydipsia, polyphagia and polyuria.  Genitourinary: Negative.  Negative for dysuria, urgency, frequency, hematuria, flank pain, decreased urine volume, enuresis and difficulty urinating.  Musculoskeletal: Positive for back pain and arthralgias. Negative for myalgias and joint swelling.  Skin: Negative.  Negative for color change and rash.  Allergic/Immunologic: Negative.   Neurological: Negative.  Negative for dizziness.  Hematological: Negative.  Negative for adenopathy. Does not bruise/bleed easily.  Psychiatric/Behavioral: Negative.  Negative for sleep disturbance, dysphoric mood and decreased concentration. The patient is not nervous/anxious.     Objective:  BP 130/82 mmHg  Pulse 61  Temp(Src) 97.8 F (36.6 C) (Oral)  Resp 16  Ht 6' (1.829 m)  Wt 456 lb (206.84 kg)  BMI 61.83 kg/m2  SpO2 98%  BP Readings from Last 3 Encounters:  06/04/15 130/82  02/05/15 118/60  12/31/14 118/72    Wt Readings from Last 3 Encounters:  06/04/15 456 lb (206.84 kg)  02/05/15 467 lb 4 oz (211.943 kg)  12/31/14 468 lb (212.283 kg)    Physical Exam  Constitutional: He is oriented to person, place, and time. No distress.  HENT:  Mouth/Throat: Oropharynx is clear and moist. No oropharyngeal exudate.  Eyes: Conjunctivae are normal. Right eye exhibits no discharge. Left eye exhibits no discharge. No scleral icterus.  Neck: Normal range of motion. Neck supple. No JVD present. No tracheal deviation present. No thyromegaly present.  Cardiovascular: Normal rate, regular rhythm, normal heart sounds and intact distal pulses.  Exam reveals no gallop and no friction rub.   No murmur heard. Pulmonary/Chest: Effort normal and breath sounds normal. No stridor. No respiratory distress. He has no wheezes. He has no rales. He exhibits no tenderness.  Abdominal: Soft. Bowel  sounds are normal. He exhibits no distension and no mass. There is no tenderness. There is no rebound and no guarding.  Musculoskeletal: He exhibits edema (trace edema in BLE). He exhibits no tenderness.       Right knee: He exhibits deformity (DJD). He exhibits normal range of motion, no swelling, no effusion, no ecchymosis and no erythema. No tenderness found.       Left knee: He exhibits deformity (DJD). He exhibits normal range of motion, no swelling, no effusion, no ecchymosis and no laceration. No tenderness found.       Lumbar back: He exhibits decreased range of motion. He exhibits no tenderness, no bony tenderness, no swelling and no edema.  Lymphadenopathy:    He has no cervical adenopathy.  Neurological: He is oriented to person, place, and time.  Skin: Skin is warm and dry. No rash noted. He is not diaphoretic. No erythema. No pallor.  Psychiatric: He has a normal mood and affect. His behavior is normal. Judgment and thought content normal.  Lab Results  Component Value Date   WBC 6.0 06/05/2015   HGB 11.0* 06/05/2015   HCT 34.0* 06/05/2015   PLT 249.0 06/05/2015   GLUCOSE 197* 06/05/2015   CHOL 132 06/05/2015   TRIG 112.0 06/05/2015   HDL 29.10* 06/05/2015   LDLDIRECT 136.8 12/12/2006   LDLCALC 80 06/05/2015   ALT 19 06/05/2015   AST 17 06/05/2015   NA 140 06/05/2015   K 4.3 06/05/2015   CL 104 06/05/2015   CREATININE 1.30 06/05/2015   BUN 17 06/05/2015   CO2 27 06/05/2015   TSH 0.89 06/05/2015   PSA 0.92 08/11/2014   HGBA1C 10.2* 06/05/2015   MICROALBUR 2.7* 12/31/2014    Dg Abd Acute W/chest  01/14/2015   CLINICAL DATA:  Epigastric pain.  EXAM: DG ABDOMEN ACUTE W/ 1V CHEST  COMPARISON:  CT 03/03/2014. Abdomen 12/17/2013. Chest x-ray 04/14/2011.  FINDINGS: Mediastinum and hilar structures are normal. Cardiomegaly with mild pulmonary vascular prominence and interstitial prominence. Mild congestive heart failure cannot be excluded. Active interstitial disease such  as pneumonitis cannot be excluded.  Prior lap band surgery. No bowel distention or free air. Stool present in colon. Postsurgical changes both hips. Surgical clips are present in the pelvis. Thoracolumbar and bilateral hip degenerative change.  IMPRESSION: 1. Cardiomegaly with pulmonary vascular prominence interstitial prominence suggesting congestive heart failure. Active pneumonitis cannot be excluded. 2. Prior lap band surgery. No evidence of bowel distention. Stool noted throughout the colon.   Electronically Signed   By: [name]  Register   On: 01/14/2015 12:46   Dg Foot Complete Left  01/14/2015   CLINICAL DATA:  Plantar surface foot pain.  No known injury.  EXAM: LEFT FOOT - COMPLETE 3+ VIEW  COMPARISON:  02/06/2014.  FINDINGS: Diffuse degenerative changes noted of the left foot and ankle. No evidence of acute fracture or dislocation. Tiny bony densities noted adjacent to the malleoli are stable and may represent old fracture fragments. Soft tissue calcifications noted the ankle, most likely vascular.  IMPRESSION: Diffuse degenerative change.  No acute bony abnormality identified.   Electronically Signed   By: [name]  Register   On: 01/14/2015 12:49    Assessment & Plan:   Asah was seen today for hypertension, hypothyroidism, hyperlipidemia, diabetes, osteoarthritis and back pain.  Diagnoses and all orders for this visit:  Essential hypertension- his blood pressure is well-controlled, electrolytes and renal function are stable. -     Comprehensive metabolic panel; Future -     CBC with Differential/Platelet; Future  Other specified hypothyroidism- his TSH is in the normal range, he will stay on the current dose of levothyroxine. -     Lipid panel; Future -     TSH; Future  Type 1 diabetes mellitus with complications- his blood sugars are not well-controlled, I have asked him to follow-up with his endocrinologist and diabetic eduction as soon as possible. -     Lipid panel; Future -      Hemoglobin A1c; Future -     Amb Referral to Nutrition and Diabetic E -     Ambulatory referral to Endocrinology  Hyperlipidemia with target LDL less than 100- he is achieved his LDL goal and is doing well on the statin. -     Lipid panel; Future -     Comprehensive metabolic panel; Future -     TSH; Future  Iron deficiency anemia- some improvement noted with respect to this, he will continue iron replacement therapy. -     CBC with  Differential/Platelet; Future  Diabetic nephropathy associated with type 1 diabetes mellitus- I have asked him to try Hysingla for relief from his pain. -     Hemoglobin A1c; Future -     Discontinue: HYDROcodone Bitartrate ER (HYSINGLA ER) 20 MG T24A; Take 1 tablet by mouth daily. -     Discontinue: HYDROcodone Bitartrate ER (HYSINGLA ER) 20 MG T24A; Take 1 tablet by mouth daily. -     HYDROcodone Bitartrate ER (HYSINGLA ER) 20 MG T24A; Take 1 tablet by mouth daily. -     Amb Referral to Nutrition and Diabetic E -     Ambulatory referral to Endocrinology  Midline low back pain with sciatica, sciatica laterality unspecified- he will try Hysingla for relief from his pain -     Discontinue: HYDROcodone Bitartrate ER (HYSINGLA ER) 20 MG T24A; Take 1 tablet by mouth daily. -     Discontinue: HYDROcodone Bitartrate ER (HYSINGLA ER) 20 MG T24A; Take 1 tablet by mouth daily. -     HYDROcodone Bitartrate ER (HYSINGLA ER) 20 MG T24A; Take 1 tablet by mouth daily.  I have discontinued Mr. Colyer HYDROcodone-acetaminophen. I am also having him maintain his vitamin C, vitamin E, aspirin EC, ferrous sulfate, cycloSPORINE, ferrous sulfate, Multiple Vitamins-Minerals (ZINC PO), bisoprolol, furosemide, insulin detemir, losartan, polyethylene glycol powder, ZETIA, glucose blood, ULTICARE SHORT PEN NEEDLES, levothyroxine, atorvastatin, LEVEMIR FLEXTOUCH, Diclofenac Sodium, zinc gluconate, cholecalciferol, and HYDROcodone Bitartrate ER.  Meds ordered this encounter    Medications  . DISCONTD: HYDROcodone Bitartrate ER (HYSINGLA ER) 20 MG T24A    Sig: Take 1 tablet by mouth daily.    Dispense:  30 each    Refill:  0    Fill on or after 06/04/15  . DISCONTD: HYDROcodone Bitartrate ER (HYSINGLA ER) 20 MG T24A    Sig: Take 1 tablet by mouth daily.    Dispense:  30 each    Refill:  0    Fill on or after 07/04/15  . HYDROcodone Bitartrate ER (HYSINGLA ER) 20 MG T24A    Sig: Take 1 tablet by mouth daily.    Dispense:  30 each    Refill:  0    Fill on or after 08/04/15     Follow-up: Return in about 4 months (around 10/04/2015).  Scarlette Calico, MD

## 2015-06-05 ENCOUNTER — Telehealth: Payer: Self-pay | Admitting: Internal Medicine

## 2015-06-05 ENCOUNTER — Other Ambulatory Visit (INDEPENDENT_AMBULATORY_CARE_PROVIDER_SITE_OTHER): Payer: Commercial Managed Care - HMO

## 2015-06-05 DIAGNOSIS — E038 Other specified hypothyroidism: Secondary | ICD-10-CM | POA: Diagnosis not present

## 2015-06-05 DIAGNOSIS — E1021 Type 1 diabetes mellitus with diabetic nephropathy: Secondary | ICD-10-CM

## 2015-06-05 DIAGNOSIS — I10 Essential (primary) hypertension: Secondary | ICD-10-CM

## 2015-06-05 DIAGNOSIS — E785 Hyperlipidemia, unspecified: Secondary | ICD-10-CM | POA: Diagnosis not present

## 2015-06-05 DIAGNOSIS — E108 Type 1 diabetes mellitus with unspecified complications: Secondary | ICD-10-CM

## 2015-06-05 DIAGNOSIS — D509 Iron deficiency anemia, unspecified: Secondary | ICD-10-CM

## 2015-06-05 LAB — COMPREHENSIVE METABOLIC PANEL
ALBUMIN: 3.5 g/dL (ref 3.5–5.2)
ALK PHOS: 164 U/L — AB (ref 39–117)
ALT: 19 U/L (ref 0–53)
AST: 17 U/L (ref 0–37)
BUN: 17 mg/dL (ref 6–23)
CO2: 27 mEq/L (ref 19–32)
CREATININE: 1.3 mg/dL (ref 0.40–1.50)
Calcium: 8.8 mg/dL (ref 8.4–10.5)
Chloride: 104 mEq/L (ref 96–112)
GFR: 73.03 mL/min (ref >60.00)
Glucose, Bld: 197 mg/dL — ABNORMAL HIGH (ref 70–99)
POTASSIUM: 4.3 meq/L (ref 3.5–5.1)
SODIUM: 140 meq/L (ref 135–145)
TOTAL PROTEIN: 7.7 g/dL (ref 6.0–8.3)
Total Bilirubin: 0.4 mg/dL (ref 0.2–1.2)

## 2015-06-05 LAB — LIPID PANEL
CHOLESTEROL: 132 mg/dL (ref 0–200)
HDL: 29.1 mg/dL — ABNORMAL LOW (ref >39.00)
LDL Cholesterol: 80 mg/dL (ref 0–99)
NonHDL: 102.62
Total CHOL/HDL Ratio: 5
Triglycerides: 112 mg/dL (ref 0.0–149.0)
VLDL: 22.4 mg/dL (ref 0.0–40.0)

## 2015-06-05 LAB — CBC WITH DIFFERENTIAL/PLATELET
BASOS ABS: 0 10*3/uL (ref 0.0–0.1)
Basophils Relative: 0.3 % (ref 0.0–3.0)
EOS ABS: 0.3 10*3/uL (ref 0.0–0.7)
Eosinophils Relative: 5.1 % — ABNORMAL HIGH (ref 0.0–5.0)
HCT: 34 % — ABNORMAL LOW (ref 39.0–52.0)
Hemoglobin: 11 g/dL — ABNORMAL LOW (ref 13.0–17.0)
LYMPHS PCT: 23.5 % (ref 12.0–46.0)
Lymphs Abs: 1.4 10*3/uL (ref 0.7–4.0)
MCHC: 32.4 g/dL (ref 30.0–36.0)
MCV: 83.8 fl (ref 78.0–100.0)
MONO ABS: 0.5 10*3/uL (ref 0.1–1.0)
Monocytes Relative: 8.5 % (ref 3.0–12.0)
NEUTROS PCT: 62.6 % (ref 43.0–77.0)
Neutro Abs: 3.8 10*3/uL (ref 1.4–7.7)
Platelets: 249 10*3/uL (ref 150.0–400.0)
RBC: 4.06 Mil/uL — ABNORMAL LOW (ref 4.22–5.81)
RDW: 15.7 % — ABNORMAL HIGH (ref 11.5–15.5)
WBC: 6 10*3/uL (ref 4.0–10.5)

## 2015-06-05 LAB — TSH: TSH: 0.89 u[IU]/mL (ref 0.35–4.50)

## 2015-06-05 LAB — HEMOGLOBIN A1C: Hgb A1c MFr Bld: 10.2 % — ABNORMAL HIGH (ref 4.6–6.5)

## 2015-06-05 NOTE — Telephone Encounter (Signed)
Patient states he apologizes for leaving yesterday.  States he was afraid of the needles and that the [facility] bus was here to pick him up.  He also would like to know if he can get additional scripts for hydrocodone to last 3 months.

## 2015-06-07 ENCOUNTER — Encounter: Payer: Self-pay | Admitting: Internal Medicine

## 2015-06-15 ENCOUNTER — Telehealth: Payer: Self-pay | Admitting: Internal Medicine

## 2015-06-15 NOTE — Telephone Encounter (Signed)
Patient is calling to follow up on wheelchair order request send by [facility]

## 2015-06-15 NOTE — Telephone Encounter (Signed)
Spoke to [employer]. Regarding order for wheelchair. If any further information is required she will let me know.

## 2015-06-17 ENCOUNTER — Other Ambulatory Visit: Payer: Self-pay | Admitting: Internal Medicine

## 2015-06-17 DIAGNOSIS — E1142 Type 2 diabetes mellitus with diabetic polyneuropathy: Secondary | ICD-10-CM

## 2015-06-17 DIAGNOSIS — I5032 Chronic diastolic (congestive) heart failure: Secondary | ICD-10-CM

## 2015-06-17 DIAGNOSIS — E669 Obesity, unspecified: Secondary | ICD-10-CM

## 2015-06-30 ENCOUNTER — Other Ambulatory Visit: Payer: Self-pay | Admitting: Internal Medicine

## 2015-07-01 ENCOUNTER — Encounter: Payer: Self-pay | Admitting: Endocrinology

## 2015-07-10 ENCOUNTER — Other Ambulatory Visit: Payer: Self-pay | Admitting: Internal Medicine

## 2015-07-28 ENCOUNTER — Other Ambulatory Visit: Payer: Self-pay | Admitting: Internal Medicine

## 2015-07-28 DIAGNOSIS — E1021 Type 1 diabetes mellitus with diabetic nephropathy: Secondary | ICD-10-CM

## 2015-07-28 DIAGNOSIS — M5440 Lumbago with sciatica, unspecified side: Secondary | ICD-10-CM

## 2015-07-28 MED ORDER — HYDROCODONE BITARTRATE ER 20 MG PO T24A: 1.0000 | EXTENDED_RELEASE_TABLET | Freq: Every day | ORAL | Status: DC

## 2015-07-28 NOTE — Telephone Encounter (Signed)
Patient requesting refill for HYDROcodone Bitartrate ER [facility] ER) 20 MG T24A [id number]

## 2015-07-28 NOTE — Telephone Encounter (Signed)
Request for rf on pended med

## 2015-07-31 ENCOUNTER — Telehealth: Payer: Self-pay

## 2015-07-31 NOTE — Telephone Encounter (Signed)
Received note from pharmacy Hysingla is not longer covered. Alternative would b morphine or fentanyl. Please advise

## 2015-08-03 ENCOUNTER — Encounter: Payer: Self-pay | Admitting: Internal Medicine

## 2015-08-03 ENCOUNTER — Ambulatory Visit (INDEPENDENT_AMBULATORY_CARE_PROVIDER_SITE_OTHER): Payer: Commercial Managed Care - HMO | Admitting: Internal Medicine

## 2015-08-03 VITALS — BP 108/80 | HR 66 | Temp 97.9°F | Resp 16 | Ht 71.0 in | Wt >= 6400 oz

## 2015-08-03 DIAGNOSIS — E1029 Type 1 diabetes mellitus with other diabetic kidney complication: Secondary | ICD-10-CM | POA: Diagnosis not present

## 2015-08-03 DIAGNOSIS — E0821 Diabetes mellitus due to underlying condition with diabetic nephropathy: Secondary | ICD-10-CM

## 2015-08-03 DIAGNOSIS — M150 Primary generalized (osteo)arthritis: Secondary | ICD-10-CM | POA: Diagnosis not present

## 2015-08-03 DIAGNOSIS — M159 Polyosteoarthritis, unspecified: Secondary | ICD-10-CM

## 2015-08-03 DIAGNOSIS — M160 Bilateral primary osteoarthritis of hip: Secondary | ICD-10-CM

## 2015-08-03 DIAGNOSIS — M545 Low back pain: Secondary | ICD-10-CM

## 2015-08-03 DIAGNOSIS — I10 Essential (primary) hypertension: Secondary | ICD-10-CM

## 2015-08-03 DIAGNOSIS — R809 Proteinuria, unspecified: Secondary | ICD-10-CM

## 2015-08-03 DIAGNOSIS — E1142 Type 2 diabetes mellitus with diabetic polyneuropathy: Secondary | ICD-10-CM

## 2015-08-03 MED ORDER — HYDROCODONE-ACETAMINOPHEN 10-325 MG PO TABS: 1.0000 | ORAL_TABLET | Freq: Four times a day (QID) | ORAL | Status: DC | PRN

## 2015-08-03 NOTE — Patient Instructions (Signed)

## 2015-08-03 NOTE — Assessment & Plan Note (Signed)
His insurance would not cover Hysingla so I have changed him to generic Norco for pain relief.

## 2015-08-03 NOTE — Assessment & Plan Note (Signed)
His blood pressure is adequately well controlled. 

## 2015-08-03 NOTE — Assessment & Plan Note (Signed)
Will prescribe Norco for pain relief

## 2015-08-03 NOTE — Progress Notes (Signed)
Pre visit review using our clinic review tool, if applicable. No additional management support is needed unless otherwise documented below in the visit note. 

## 2015-08-03 NOTE — Assessment & Plan Note (Signed)
Requests a referral to a new endocrinologist.

## 2015-08-03 NOTE — Progress Notes (Signed)
Subjective:  Patient ID: [name], male    DOB: [birth year]  Age: 59 y.o. MRN: [id number]  CC: Osteoarthritis and Back Pain  He needs a power wheelchair.  HPI Breeze Schackmann presents for a mobility examination.  His ability to participate in mobility related activities of daily living are limited by severe pain in his back and joints. His ability to move from room to room, toilet, dress, feed, groom and, bathing are all affected by pain in his back and joints. He has such severe difficulty moving that he sometimes has spontaneous falls resulting in injuries. He has been using a seated walker but is still experiencing frequent falls. He has poor balance and unsteady gait.  His pain level ranges from 8-9 on any given day. This makes it impossible for him to use a manual wheelchair. He also has decreased range of motion in his back and shoulders and hips. He cannot safely transfer in and out of a POV due to pain and instability. He does have the physical and mental abilities to operate a power wheelchair safely in his home. He is willing and motivated to use a power wheelchair.  Outpatient Prescriptions Prior to Visit  Medication Sig Dispense Refill  . atorvastatin (LIPITOR) 40 MG tablet TAKE 1 TABLET (40 MG TOTAL) BY MOUTH DAILY. 90 tablet 3  . bisoprolol (ZEBETA) 10 MG tablet TAKE 1 TABLET (10 MG TOTAL) BY MOUTH EVERY EVENING. 90 tablet 3  . cholecalciferol (VITAMIN D) 1000 UNITS tablet Take 1,000 Units by mouth daily.    . cycloSPORINE (RESTASIS) 0.05 % ophthalmic emulsion Place 1 drop into both eyes 2 (two) times daily. 0.4 mL 5  . Diclofenac Sodium (PENNSAID) 1.5 % SOLN Place 0.2 mLs onto the skin 3 (three) times daily. 150 mL 11  . ferrous sulfate 325 (65 FE) MG tablet Take 325 mg by mouth 2 (two) times daily with a meal.    . ferrous sulfate 325 (65 FE) MG tablet TAKE 1 TABLET BY MOUTH 2 TIMES DAILY WITH A MEAL. 60 tablet 6  . furosemide (LASIX) 40 MG tablet Take 1 tablet (40 mg total) by  mouth 2 (two) times daily. 180 tablet 3  . glucose blood (ONETOUCH VERIO) test strip Use to check blood sugars three times day  Dx E10.9 100 each 12  . insulin detemir (LEVEMIR) 100 UNIT/ML injection Inject 2.4 mLs (240 Units total) into the skin every morning. 10 mL 11  . LEVEMIR FLEXTOUCH 100 UNIT/ML Pen     . levothyroxine (SYNTHROID, LEVOTHROID) 50 MCG tablet TAKE 1 TABLET BY MOUTH DAILY BEFORE BREAKFAST. 90 tablet 2  . losartan (COZAAR) 25 MG tablet Take 1 tablet (25 mg total) by mouth daily. 90 tablet 3  . Multiple Vitamins-Minerals (ZINC PO) Take by mouth.    . polyethylene glycol powder (GLYCOLAX/MIRALAX) powder Take 17 g by mouth daily as needed for mild constipation. 255 g 11  . ULTICARE SHORT PEN NEEDLES 31G X 8 MM MISC Dx E10.9 Use one pen needle once daily with insulin 100 each 11  . vitamin C (ASCORBIC ACID) 500 MG tablet Take 1,000 mg by mouth 2 (two) times daily.     . vitamin E 200 UNIT capsule Take 400 Units by mouth every morning.     Marland Kitchen ZETIA 10 MG tablet Take 1 tablet (10 mg total) by mouth daily. 90 tablet 3  . zinc gluconate 50 MG tablet Take 50 mg by mouth daily.    Marland Kitchen aspirin EC 325  MG tablet Take 325 mg by mouth every evening.     Marland Kitchen HYDROcodone Bitartrate ER (HYSINGLA ER) 20 MG T24A Take 1 tablet by mouth daily. (Patient not taking: Reported on 08/03/2015) 30 each 0   No facility-administered medications prior to visit.    ROS Review of Systems  Constitutional: Positive for fatigue. Negative for chills, diaphoresis and appetite change.  HENT: Negative.   Eyes: Negative.   Respiratory: Positive for apnea. Negative for cough, choking, shortness of breath, wheezing and stridor.   Cardiovascular: Negative.  Negative for chest pain, palpitations and leg swelling.  Gastrointestinal: Negative.  Negative for nausea, vomiting, abdominal pain, diarrhea, constipation and blood in stool.  Endocrine: Negative.   Genitourinary: Negative.   Musculoskeletal: Positive for back  pain, arthralgias and gait problem. Negative for myalgias, joint swelling, neck pain and neck stiffness.  Skin: Negative.  Negative for rash.  Allergic/Immunologic: Negative.   Neurological: Negative.  Negative for dizziness, syncope, speech difficulty, weakness, light-headedness, numbness and headaches.  Hematological: Negative.  Negative for adenopathy. Does not bruise/bleed easily.  Psychiatric/Behavioral: Negative.     Objective:  BP 108/80 mmHg  Pulse 66  Temp(Src) 97.9 F (36.6 C) (Oral)  Resp 16  Ht 5\' 11"  (1.803 m)  Wt 465 lb (210.923 kg)  BMI 64.88 kg/m2  SpO2 94%  BP Readings from Last 3 Encounters:  08/03/15 108/80  06/04/15 130/82  02/05/15 118/60    Wt Readings from Last 3 Encounters:  08/03/15 465 lb (210.923 kg)  06/04/15 456 lb (206.84 kg)  02/05/15 467 lb 4 oz (211.943 kg)    Physical Exam  Constitutional: No distress.  HENT:  Mouth/Throat: Oropharynx is clear and moist. No oropharyngeal exudate.  Eyes: Conjunctivae are normal. Right eye exhibits no discharge. Left eye exhibits no discharge. No scleral icterus.  Neck: Normal range of motion. Neck supple. No JVD present. No tracheal deviation present. No thyromegaly present.  Cardiovascular: Normal rate, regular rhythm, normal heart sounds and intact distal pulses.  Exam reveals no gallop and no friction rub.   No murmur heard. Pulmonary/Chest: Effort normal. No stridor. No respiratory distress. He has no wheezes. He has no rales. He exhibits no tenderness.  Abdominal: Soft. Bowel sounds are normal. He exhibits no distension and no mass. There is no tenderness. There is no rebound and no guarding.  Musculoskeletal: He exhibits no edema.       Right shoulder: He exhibits decreased range of motion. He exhibits no bony tenderness, no swelling, no effusion, no crepitus and no deformity.       Left shoulder: He exhibits decreased range of motion. He exhibits no bony tenderness, no swelling, no effusion, no  crepitus, no deformity and no laceration.       Right hip: He exhibits decreased range of motion and decreased strength. He exhibits no tenderness, no bony tenderness, no swelling and no deformity.       Left hip: He exhibits decreased range of motion and decreased strength. He exhibits no tenderness, no bony tenderness, no swelling and no deformity.       Right knee: He exhibits decreased range of motion. He exhibits no swelling, no effusion, no ecchymosis, no deformity, no laceration, no erythema, normal alignment, no LCL laxity and normal patellar mobility. No tenderness found.       Left knee: He exhibits decreased range of motion. He exhibits no swelling, no effusion, no ecchymosis, no deformity, no laceration, no erythema, normal alignment, no LCL laxity, normal patellar mobility and  no bony tenderness. No tenderness found.       Lumbar back: He exhibits decreased range of motion and pain. He exhibits no tenderness, no bony tenderness, no swelling, no edema, no deformity, no laceration, no spasm and normal pulse.  Lymphadenopathy:    He has no cervical adenopathy.  Neurological: He displays no atrophy, no tremor and normal reflexes. No cranial nerve deficit or sensory deficit. He exhibits normal muscle tone. He displays no seizure activity. Coordination and gait abnormal.  Skin: He is not diaphoretic.  Vitals reviewed.   Lab Results  Component Value Date   WBC 6.0 06/05/2015   HGB 11.0* 06/05/2015   HCT 34.0* 06/05/2015   PLT 249.0 06/05/2015   GLUCOSE 197* 06/05/2015   CHOL 132 06/05/2015   TRIG 112.0 06/05/2015   HDL 29.10* 06/05/2015   LDLDIRECT 136.8 12/12/2006   LDLCALC 80 06/05/2015   ALT 19 06/05/2015   AST 17 06/05/2015   NA 140 06/05/2015   K 4.3 06/05/2015   CL 104 06/05/2015   CREATININE 1.30 06/05/2015   BUN 17 06/05/2015   CO2 27 06/05/2015   TSH 0.89 06/05/2015   PSA 0.92 08/11/2014   HGBA1C 10.2* 06/05/2015   MICROALBUR 2.7* 12/31/2014    Dg Abd Acute  W/chest  01/14/2015  CLINICAL DATA:  Epigastric pain. EXAM: DG ABDOMEN ACUTE W/ 1V CHEST COMPARISON:  CT 03/03/2014. Abdomen 12/17/2013. Chest x-ray 04/14/2011. FINDINGS: Mediastinum and hilar structures are normal. Cardiomegaly with mild pulmonary vascular prominence and interstitial prominence. Mild congestive heart failure cannot be excluded. Active interstitial disease such as pneumonitis cannot be excluded. Prior lap band surgery. No bowel distention or free air. Stool present in colon. Postsurgical changes both hips. Surgical clips are present in the pelvis. Thoracolumbar and bilateral hip degenerative change. IMPRESSION: 1. Cardiomegaly with pulmonary vascular prominence interstitial prominence suggesting congestive heart failure. Active pneumonitis cannot be excluded. 2. Prior lap band surgery. No evidence of bowel distention. Stool noted throughout the colon. Electronically Signed   By: [name]  Register   On: 01/14/2015 12:46   Dg Foot Complete Left  01/14/2015  CLINICAL DATA:  Plantar surface foot pain.  No known injury. EXAM: LEFT FOOT - COMPLETE 3+ VIEW COMPARISON:  02/06/2014. FINDINGS: Diffuse degenerative changes noted of the left foot and ankle. No evidence of acute fracture or dislocation. Tiny bony densities noted adjacent to the malleoli are stable and may represent old fracture fragments. Soft tissue calcifications noted the ankle, most likely vascular. IMPRESSION: Diffuse degenerative change.  No acute bony abnormality identified. Electronically Signed   By: [name]  Register   On: 01/14/2015 12:49    Assessment & Plan:    I have discontinued Mr. Kubick HYDROcodone Bitartrate ER. I am also having him maintain his vitamin C, vitamin E, aspirin EC, ferrous sulfate, cycloSPORINE, Multiple Vitamins-Minerals (ZINC PO), furosemide, insulin detemir, losartan, polyethylene glycol powder, ZETIA, glucose blood, ULTICARE SHORT PEN NEEDLES, levothyroxine, atorvastatin, LEVEMIR FLEXTOUCH,  Diclofenac Sodium, zinc gluconate, cholecalciferol, ferrous sulfate, bisoprolol, and HYDROcodone-acetaminophen.  Meds ordered this encounter  Medications  . DISCONTD: HYDROcodone-acetaminophen (NORCO) 10-325 MG tablet    Sig: Take 1 tablet by mouth every 6 (six) hours as needed.    Dispense:  90 tablet    Refill:  0    Fill on or after 08/03/15  . DISCONTD: HYDROcodone-acetaminophen (NORCO) 10-325 MG tablet    Sig: Take 1 tablet by mouth every 6 (six) hours as needed.    Dispense:  90 tablet    Refill:  0    Fill on or after 09/02/15  . HYDROcodone-acetaminophen (NORCO) 10-325 MG tablet    Sig: Take 1 tablet by mouth every 6 (six) hours as needed.    Dispense:  90 tablet    Refill:  0    Fill on or after 10/03/15     Follow-up: Return in about 3 months (around 11/03/2015).  Scarlette Calico, MD

## 2015-08-28 DIAGNOSIS — Z7689 Persons encountering health services in other specified circumstances: Secondary | ICD-10-CM

## 2015-09-09 ENCOUNTER — Telehealth: Payer: Self-pay | Admitting: Internal Medicine

## 2015-09-09 NOTE — Telephone Encounter (Signed)
Dr. [name], [name] - Received fax from [facility] (Dr. Katina Degree office) that patient declined to schedule an appointment with them. They spoke with the patient on 09/08/15 at 3:00pm. This is the 4th failed endocrinology referral for this patient this year.

## 2015-09-10 ENCOUNTER — Other Ambulatory Visit: Payer: Self-pay | Admitting: Internal Medicine

## 2015-09-17 ENCOUNTER — Other Ambulatory Visit: Payer: Self-pay

## 2015-09-17 NOTE — Patient Outreach (Signed)
[place] [facility]) Care Management  09/17/2015  Kaiser Gundlach [birth year] [id number]   Telephone Screen  Referral Date: 09/16/15 Referral Source: [facility] tier 4 list Referral Reason: DM,COPD, no ED visits or hospital admits   Outreach attempt # 1 to patient. No answer and unable to leave voicemail message at this time.  Plan: RN CM will attempt outreach call to patient within a week.  Enzo Montgomery, RN,BSN,CCM [place] Management Telephonic Care Management Coordinator Direct Phone: [phone] Toll Free: [phone] Fax: [phone]

## 2015-09-22 ENCOUNTER — Ambulatory Visit: Payer: Self-pay

## 2015-09-22 ENCOUNTER — Other Ambulatory Visit: Payer: Self-pay

## 2015-09-22 NOTE — Patient Outreach (Signed)
[facility]) Care Management  09/22/2015  [name] [birth year] [id number]  Telephone Screen  Referral Date: 09/16/15 Referral Source: [facility] tier 4 list Referral Reason: DM,COPD, no ED visits or hospital admits  Outreach attempt # 2 to patient. No answer and unable to leave message.   Plan: RN CM will attempt outreach call to patient within a week.  Enzo Montgomery, RN,BSN,CCM [place] Management Telephonic Care Management Coordinator Direct Phone: [phone] Toll Free: [phone] Fax: [phone]

## 2015-09-24 ENCOUNTER — Other Ambulatory Visit: Payer: Self-pay

## 2015-09-24 ENCOUNTER — Telehealth: Payer: Self-pay

## 2015-09-24 DIAGNOSIS — Z794 Long term (current) use of insulin: Principal | ICD-10-CM

## 2015-09-24 DIAGNOSIS — E119 Type 2 diabetes mellitus without complications: Secondary | ICD-10-CM

## 2015-09-24 NOTE — Patient Outreach (Signed)
[place] [facility]) Care Management  09/24/2015  Amyas Erdmann [birth year] [id number]   Telephone Screen  Referral Date: 09/16/15 Referral Source: [facility] tier 4 list Referral Reason: DM,COPD, no ED visits or hospital admits  Outreach attempt # 3 to patient. Patient reached and screening completed.  Social: Patient states that he lives in his home alone. He is independent with his ADLs/IADLs. He states that he has been having a lot of ongoing issues with pain to hip, back and legs. He is unsure of the cause but reports possible arthritis. Patient uses [facility] for medical appointments.   Conditions: Patient has h/o DM,HLD,HTN, obese(per notes 400lbs), OA, OSA. Patient states that his blood sugars have been "running a little high." he reports having not taken his cbg yet today. When questioned about range of cbg values he stated somewhere in the 200's. Per notes last Alc was 10.2(Sept 2016). Also, noted in patient's chart that he has declined referral for endocrinologist referral four times in the past year. Patient states that his CPAP machine has been broken for several months now.   Medications: Per patient he is taking more than 72meds. He denied any issues with affordability at present. He states that his meds are delivered to his home.  Consents: Patient gave verbal consent for [facility] services. Patient states that he is travelling to [place] to visit family from 10/02/15-10/09/15 and would not be available for home visit during that time.   Plan: RN CM will notify [facility] administrative assistant that patient agreed to services and case opened. RN CM will send [facility] community referral for further in home evaluation and assessment. RN CM will send [facility] pharmacy referral for polypharmacy. RN CM provided patient with [facility] contact info along with 24 hr Nurse [facility].  Enzo Montgomery, RN,BSN,CCM [place] Management Telephonic Care Management Coordinator Direct Phone: [phone] Toll  Free: [phone] Fax: [phone]

## 2015-09-24 NOTE — Telephone Encounter (Signed)
[name] called from [employer]. [employer] needed some information because the pt called about the DME electric powered wheel chair.  [name] gave the [facility] codes that is covered per the members plan. The ordered chair is covered due to the pt wt and other diagnoses.  It looks like the chair is covered but will need to have an authorization done. The number to call # [phone]. Called the number and was given the number and fax to [employer] back.  fax # [phone] or call # [phone]  OV notes and member ID needed to submit the request. Notation per the above conversation entered with [facility] per [name].  Called but number would not let me through.   Sending fax with information.  Will follow up on that in the morning.

## 2015-09-28 ENCOUNTER — Other Ambulatory Visit: Payer: Self-pay | Admitting: Pharmacist

## 2015-09-28 NOTE — Patient Outreach (Signed)
[facility]) Care Management  [place]   09/28/2015  Guillaume Brummer February 23, 1956 [id number]  Subjective: [name] is a 60 y.o. male who was referred to [place] for a medication review.   Objective:   Current Medications: Current Outpatient Prescriptions  Medication Sig Dispense Refill  . aspirin EC 325 MG tablet Take 325 mg by mouth every evening.     Marland Kitchen atorvastatin (LIPITOR) 40 MG tablet TAKE 1 TABLET (40 MG TOTAL) BY MOUTH DAILY. 90 tablet 3  . bisoprolol (ZEBETA) 10 MG tablet TAKE 1 TABLET (10 MG TOTAL) BY MOUTH EVERY EVENING. 90 tablet 3  . bisoprolol (ZEBETA) 10 MG tablet TAKE 1 TABLET (10 MG TOTAL) BY MOUTH EVERY EVENING. 90 tablet 3  . cholecalciferol (VITAMIN D) 1000 UNITS tablet Take 1,000 Units by mouth daily.    . cycloSPORINE (RESTASIS) 0.05 % ophthalmic emulsion Place 1 drop into both eyes 2 (two) times daily. 0.4 mL 5  . Diclofenac Sodium (PENNSAID) 1.5 % SOLN Place 0.2 mLs onto the skin 3 (three) times daily. 150 mL 11  . ferrous sulfate 325 (65 FE) MG tablet Take 325 mg by mouth 2 (two) times daily with a meal.    . ferrous sulfate 325 (65 FE) MG tablet TAKE 1 TABLET BY MOUTH 2 TIMES DAILY WITH A MEAL. 60 tablet 6  . furosemide (LASIX) 40 MG tablet TAKE 1 TABLET (40 MG TOTAL) BY MOUTH 2 TIMES DAILY. 180 tablet 3  . glucose blood (ONETOUCH VERIO) test strip Use to check blood sugars three times day  Dx E10.9 100 each 12  . HYDROcodone-acetaminophen (NORCO) 10-325 MG tablet Take 1 tablet by mouth every 6 (six) hours as needed. 90 tablet 0  . insulin detemir (LEVEMIR) 100 UNIT/ML injection Inject 2.4 mLs (240 Units total) into the skin every morning. 10 mL 11  . LEVEMIR FLEXTOUCH 100 UNIT/ML Pen     . levothyroxine (SYNTHROID, LEVOTHROID) 50 MCG tablet TAKE 1 TABLET BY MOUTH DAILY BEFORE BREAKFAST. 90 tablet 2  . losartan (COZAAR) 25 MG tablet TAKE 1 TABLET (25 MG TOTAL) BY MOUTH DAILY. 90 tablet 3  . Multiple Vitamins-Minerals (ZINC PO)  Take by mouth.    . polyethylene glycol powder (GLYCOLAX/MIRALAX) powder Take 17 g by mouth daily as needed for mild constipation. 255 g 11  . ULTICARE SHORT PEN NEEDLES 31G X 8 MM MISC Dx E10.9 Use one pen needle once daily with insulin 100 each 11  . vitamin C (ASCORBIC ACID) 500 MG tablet Take 1,000 mg by mouth 2 (two) times daily.     . vitamin E 200 UNIT capsule Take 400 Units by mouth every morning.     Marland Kitchen ZETIA 10 MG tablet Take 1 tablet (10 mg total) by mouth daily. 90 tablet 3  . zinc gluconate 50 MG tablet Take 50 mg by mouth daily.     No current facility-administered medications for this visit.    Functional Status: In your present state of health, do you have any difficulty performing the following activities: 02/05/2015  Hearing? N  Vision? N  Difficulty concentrating or making decisions? N  Walking or climbing stairs? Y  Dressing or bathing? N  Doing errands, shopping? N  Preparing Food and eating ? N  Using the Toilet? N  In the past six months, have you accidently leaked urine? N  Do you have problems with loss of bowel control? N  Managing your Medications? N  Managing your Finances? N  Housekeeping or managing your Housekeeping? N    Fall/Depression Screening: PHQ 2/9 Scores 09/24/2015 02/05/2015  PHQ - 2 Score 0 0    Assessment: Drugs sorted by system:  Neurologic/Psychologic: none noted  Cardiovascular: aspirin, atorvastatin, bisoprolol, furosemide, losartan, ezetimibe  Pulmonary/Allergy: none noted  Gastrointestinal: polyethylene glycol  Endocrine: Levemir, levothyroxine  Renal: none noted  Infectious Diseases: none noted  Topical: cyclosporine (eye), diclofenac  Pain: hydrocodone-acetaminophen  Vitamins/Minerals: cholecalciferol, ferrous sulfate, MVI, vitamin C, vitamin E, zinc gluconate  Miscellaneous: none noted  Findings:  Duplications in therapy: none noted  Gaps in therapy: none noted - patient on ARB, beta blocker, and aspirin, as  well as a statin.  Medications to avoid in the elderly: patient <65  Drug interactions: none noted  Inappropriate dose: none noted - doses appropriate based on age and renal function  Other issues noted: none   Plan: 1. Medication review: no issues noted, will close pharmacy consult.   Nicoletta Ba, PharmD, [facility] [phone]

## 2015-09-29 ENCOUNTER — Other Ambulatory Visit: Payer: Self-pay | Admitting: *Deleted

## 2015-09-29 NOTE — Patient Outreach (Addendum)
[facility]) Care Management  09/29/2015  [name] December 27, 1955 [id number]   Assessment: Care coordination Referral from telephonic care management coordinator (R. Florance) for home evaluation and assessment of diabetes. Patient was reported to have high blood sugars and refused referral to endocrinologist 4 times within the last year.  Call placed and spoke to patient very briefly. Care management coordinator introduced self and explained purpose of the call. Patient states "so what can you do with my diabetes"? Explained to patient regarding [facility] services offered specifically for diabetes.  Patient asked care management coordinator to call him back on the 20th- Friday to schedule for home visit since he is going out of town.  Patient was in a hurry to get off the phone at this time.   Plan: Will call patient back on 10/09/15 per his request to set-up an initial home visit.  Corlene Sabia A. Emmalise Huard, BSN, RN-BC [place] Management Coordinator Cell: [phone]

## 2015-09-30 NOTE — Telephone Encounter (Signed)
Form refaxed as of today

## 2015-10-05 ENCOUNTER — Telehealth: Payer: Self-pay

## 2015-10-05 NOTE — Telephone Encounter (Signed)
This has been faxed to rehab department with [facility].

## 2015-10-05 NOTE — Telephone Encounter (Signed)
Spoke to Coli at [facility] and she stated that the request has been approved. They are refaxing the approval letter. DME has been approved for 13 months. After the 13th month the equipment will become the members.   Tried to call patient but no answer and no voicemail.   Received approval fax.   Message sent to [facility] with [employer] regarding the same.

## 2015-10-05 NOTE — Telephone Encounter (Signed)
-----   Message from Darlina Guys sent at 10/05/2015  9:43 AM EST ----- Regarding: RE: DME approved Yes, if you could fax a copy to [place], that would be great.  Please fax to [phone] attn: Rehab Dept. Thanks!! ----- Message -----    From: Aviva Signs, CMA    Sent: 10/05/2015   8:32 AM      To: Melissa Stenson Subject: DME approved                                   Good morning,   I have an approval letter for the DME powered wheel chair for our patient. If you need a copy faxed, please let me know.   Thanks and hope you have a great day!

## 2015-10-09 ENCOUNTER — Other Ambulatory Visit: Payer: Self-pay | Admitting: *Deleted

## 2015-10-09 ENCOUNTER — Encounter: Payer: Self-pay | Admitting: *Deleted

## 2015-10-09 NOTE — Patient Outreach (Signed)
[facility]) Care Management  10/09/2015  [name] January 02, 1956 [id number]   Assessment: Care coordination call Call placed and spoke with patient today per his request.  Care management coordinator introduced self and explained purpose of the call. Verbal consent obtained from patient.  Patient reports that he lives alone and has no significant assistance available except from his pastor. He states he is independent with his activities of daily living but "just can do enough to survive". He verbalized he can't walk far in his house because of weak legs. Patient states he uses a rollator walker for support. He reports being approved for a powered wheelchair but has to pay a co-pay amounting to $1800 which he can not afford. He states he has a fixed income and can not afford a lot.  Mr. Achord states he manages his own medications and takes them as directed but refused to review medications on the phone. He states "I'm not getting up from this bed to get my medications because I'm tired". Patient was made aware that medications will have to be reviewed on home visit.   Patient has diagnosis of type 2 diabetes and takes insulin (Levemir). Per patient, he checks his blood sugars twice a day but does not record results. He states that his blood sugar reading ranges from 90- 300. Patient was told he will be given a [facility] calendar/ notebook on home visit to write down blood sugar readings. Patient states "I'm not gonna do it, I'm telling you right now". Patient's last A1c is 10.2 (9/16).  He reports non-compliance to diabetic diet and eats regular diet instead. Per telephonic care management report, patient had refused referral to endocrinologist 4 times within the last year. His last primary care provider's visit was 08/03/15.  He mentions that he uses [facility] for transportation to his appointments.  Patient denies immediate concerns at this time. Encouraged patient to call [facility] care  management and 24-hour nurse line if needed. Contact information provided. Patient agreed to home visit next month.   Plan: Initial home visit 11/05/15. Will provide [facility] diabetes educational packet and [facility] calendar/ notebook.  [name], BSN, RN-BC [place] Management Coordinator Cell: [phone]

## 2015-10-22 ENCOUNTER — Encounter: Payer: Self-pay | Admitting: Internal Medicine

## 2015-10-22 ENCOUNTER — Ambulatory Visit (INDEPENDENT_AMBULATORY_CARE_PROVIDER_SITE_OTHER): Payer: Commercial Managed Care - HMO | Admitting: Internal Medicine

## 2015-10-22 VITALS — BP 117/88 | HR 65 | Temp 98.4°F | Resp 18 | Ht 72.0 in | Wt >= 6400 oz

## 2015-10-22 DIAGNOSIS — E1029 Type 1 diabetes mellitus with other diabetic kidney complication: Secondary | ICD-10-CM

## 2015-10-22 DIAGNOSIS — I10 Essential (primary) hypertension: Secondary | ICD-10-CM | POA: Diagnosis not present

## 2015-10-22 DIAGNOSIS — R809 Proteinuria, unspecified: Secondary | ICD-10-CM

## 2015-10-22 DIAGNOSIS — M160 Bilateral primary osteoarthritis of hip: Secondary | ICD-10-CM | POA: Diagnosis not present

## 2015-10-22 DIAGNOSIS — I5032 Chronic diastolic (congestive) heart failure: Secondary | ICD-10-CM | POA: Diagnosis not present

## 2015-10-22 DIAGNOSIS — M545 Low back pain: Secondary | ICD-10-CM

## 2015-10-22 NOTE — Patient Instructions (Signed)
Hypertension Hypertension, commonly called high blood pressure, is when the force of blood pumping through your arteries is too strong. Your arteries are the blood vessels that carry blood from your heart throughout your body. A blood pressure reading consists of a higher number over a lower number, such as 110/72. The higher number (systolic) is the pressure inside your arteries when your heart pumps. The lower number (diastolic) is the pressure inside your arteries when your heart relaxes. Ideally you want your blood pressure below 120/80. Hypertension forces your heart to work harder to pump blood. Your arteries may become narrow or stiff. Having untreated or uncontrolled hypertension can cause heart attack, stroke, kidney disease, and other problems. RISK FACTORS Some risk factors for high blood pressure are controllable. Others are not.  Risk factors you cannot control include:   Race. You may be at higher risk if you are African American.  Age. Risk increases with age.  Gender. Men are at higher risk than women before age 45 years. After age 65, women are at higher risk than men. Risk factors you can control include:  Not getting enough exercise or physical activity.  Being overweight.  Getting too much fat, sugar, calories, or salt in your diet.  Drinking too much alcohol. SIGNS AND SYMPTOMS Hypertension does not usually cause signs or symptoms. Extremely high blood pressure (hypertensive crisis) may cause headache, anxiety, shortness of breath, and nosebleed. DIAGNOSIS To check if you have hypertension, your health care provider will measure your blood pressure while you are seated, with your arm held at the level of your heart. It should be measured at least twice using the same arm. Certain conditions can cause a difference in blood pressure between your right and left arms. A blood pressure reading that is higher than normal on one occasion does not mean that you need treatment. If  it is not clear whether you have high blood pressure, you may be asked to return on a different day to have your blood pressure checked again. Or, you may be asked to monitor your blood pressure at home for 1 or more weeks. TREATMENT Treating high blood pressure includes making lifestyle changes and possibly taking medicine. Living a healthy lifestyle can help lower high blood pressure. You may need to change some of your habits. Lifestyle changes may include:  Following the DASH diet. This diet is high in fruits, vegetables, and whole grains. It is low in salt, red meat, and added sugars.  Keep your sodium intake below 2,300 mg per day.  Getting at least 30-45 minutes of aerobic exercise at least 4 times per week.  Losing weight if necessary.  Not smoking.  Limiting alcoholic beverages.  Learning ways to reduce stress. Your health care provider may prescribe medicine if lifestyle changes are not enough to get your blood pressure under control, and if one of the following is true:  You are 18-59 years of age and your systolic blood pressure is above 140.  You are 60 years of age or older, and your systolic blood pressure is above 150.  Your diastolic blood pressure is above 90.  You have diabetes, and your systolic blood pressure is over 140 or your diastolic blood pressure is over 90.  You have kidney disease and your blood pressure is above 140/90.  You have heart disease and your blood pressure is above 140/90. Your personal target blood pressure may vary depending on your medical conditions, your age, and other factors. HOME CARE INSTRUCTIONS    Have your blood pressure rechecked as directed by your health care provider.   Take medicines only as directed by your health care provider. Follow the directions carefully. Blood pressure medicines must be taken as prescribed. The medicine does not work as well when you skip doses. Skipping doses also puts you at risk for  problems.  Do not smoke.   Monitor your blood pressure at home as directed by your health care provider. SEEK MEDICAL CARE IF:   You think you are having a reaction to medicines taken.  You have recurrent headaches or feel dizzy.  You have swelling in your ankles.  You have trouble with your vision. SEEK IMMEDIATE MEDICAL CARE IF:  You develop a severe headache or confusion.  You have unusual weakness, numbness, or feel faint.  You have severe chest or abdominal pain.  You vomit repeatedly.  You have trouble breathing. MAKE SURE YOU:   Understand these instructions.  Will watch your condition.  Will get help right away if you are not doing well or get worse.   This information is not intended to replace advice given to you by your health care provider. Make sure you discuss any questions you have with your health care provider.   Document Released: 09/05/2005 Document Revised: 01/20/2015 Document Reviewed: 06/28/2013 Elsevier Interactive Patient Education 2016 Elsevier Inc.  

## 2015-10-22 NOTE — Progress Notes (Signed)
Pre visit review using our clinic review tool, if applicable. No additional management support is needed unless otherwise documented below in the visit note. 

## 2015-10-23 ENCOUNTER — Other Ambulatory Visit: Payer: Self-pay | Admitting: Internal Medicine

## 2015-10-30 ENCOUNTER — Telehealth: Payer: Self-pay | Admitting: Internal Medicine

## 2015-10-30 NOTE — Assessment & Plan Note (Signed)
His blood pressure is well-controlled

## 2015-10-30 NOTE — Progress Notes (Signed)
Subjective:  Patient ID: [name], male    DOB: 1955/09/24  Age: 60 y.o. MRN: [id number]  CC: Follow-up   HPI [name] presents for a face-to-face encounter to apply for a power wheelchair. The symptoms that limit his ambulation include bilateral hip pain, bilateral shoulder pain, and bilateral elbow pain. The pain increases with movement. Movement and standing causes the pain to radiate into his left lower extremity with weakness and tingling. The pain is caused by osteoarthritis. He has numbness and instability in both feet due to diabetic neuropathy. With exertion he also experiences shortness of breath and dyspnea on exertion due to a cardiomyopathy with congestive heart failure. When he stands using his walker he develops dependent edema that makes his symptoms worse. He is taking multiple medications to control the symptoms including narcotics, diuretics and antihypertensives. His symptoms have gradually worsened over the last few years. Other diagnoses that contribute to this are obstructive sleep apnea and hypertension. He can walk about 3 feet without stopping to use the assistance of a device such as a walker which he is currently using. His ambulation is very slow. He has a history of falls that occur about once a week and he has had injuries with the falls. The falls occur spontaneously and are caused by pain, weakness, ataxia, and incoordination in his feet. His walker does not prevent him from falling and injuring himself. He is currently using a walker and it does not prevent him from falling and causes worsening of his pain and neurological symptoms. A power mobility device is now needed to help control pain, fall prevention, prevention of lower extremity edema, and management of heart failure. He is not able to use a manual wheelchair because of pain in his upper extremities. A power operated vehicle or scooter would not be sufficient for this patient because he lives in very  close quarters in a small house. He currently has difficulty doing his activities of daily living such as eating, bathing, and taking his medications due to the above symptoms and concerns. He has developed pitting edema with the use of a walker. The home is very small, has very few obstacles that present danger to him, and with the help of a power wheelchair he would be able to perform his activities of daily living.  Outpatient Prescriptions Prior to Visit  Medication Sig Dispense Refill  . aspirin EC 325 MG tablet Take 325 mg by mouth every evening.     Marland Kitchen atorvastatin (LIPITOR) 40 MG tablet TAKE 1 TABLET (40 MG TOTAL) BY MOUTH DAILY. 90 tablet 3  . bisoprolol (ZEBETA) 10 MG tablet TAKE 1 TABLET (10 MG TOTAL) BY MOUTH EVERY EVENING. 90 tablet 3  . cholecalciferol (VITAMIN D) 1000 UNITS tablet Take 1,000 Units by mouth daily.    . cycloSPORINE (RESTASIS) 0.05 % ophthalmic emulsion Place 1 drop into both eyes 2 (two) times daily. 0.4 mL 5  . Diclofenac Sodium (PENNSAID) 1.5 % SOLN Place 0.2 mLs onto the skin 3 (three) times daily. 150 mL 11  . ferrous sulfate 325 (65 FE) MG tablet TAKE 1 TABLET BY MOUTH 2 TIMES DAILY WITH A MEAL. 60 tablet 6  . furosemide (LASIX) 40 MG tablet TAKE 1 TABLET (40 MG TOTAL) BY MOUTH 2 TIMES DAILY. 180 tablet 3  . glucose blood (ONETOUCH VERIO) test strip Use to check blood sugars three times day  Dx E10.9 100 each 12  . HYDROcodone-acetaminophen (NORCO) 10-325 MG tablet Take 1 tablet by  mouth every 6 (six) hours as needed. 90 tablet 0  . levothyroxine (SYNTHROID, LEVOTHROID) 50 MCG tablet TAKE 1 TABLET BY MOUTH DAILY BEFORE BREAKFAST. 90 tablet 2  . losartan (COZAAR) 25 MG tablet TAKE 1 TABLET (25 MG TOTAL) BY MOUTH DAILY. 90 tablet 3  . Multiple Vitamins-Minerals (ZINC PO) Take by mouth.    . polyethylene glycol powder (GLYCOLAX/MIRALAX) powder Take 17 g by mouth daily as needed for mild constipation. 255 g 11  . ULTICARE SHORT PEN NEEDLES 31G X 8 MM MISC Dx E10.9  Use one pen needle once daily with insulin 100 each 11  . vitamin C (ASCORBIC ACID) 500 MG tablet Take 1,000 mg by mouth 2 (two) times daily.     . vitamin E 200 UNIT capsule Take 400 Units by mouth every morning.     Marland Kitchen ZETIA 10 MG tablet Take 1 tablet (10 mg total) by mouth daily. 90 tablet 3  . zinc gluconate 50 MG tablet Take 50 mg by mouth daily.    . insulin detemir (LEVEMIR) 100 UNIT/ML injection Inject 2.4 mLs (240 Units total) into the skin every morning. 10 mL 11  . bisoprolol (ZEBETA) 10 MG tablet TAKE 1 TABLET (10 MG TOTAL) BY MOUTH EVERY EVENING. (Patient not taking: Reported on 10/22/2015) 90 tablet 3  . ferrous sulfate 325 (65 FE) MG tablet Take 325 mg by mouth 2 (two) times daily with a meal. Reported on 10/22/2015     No facility-administered medications prior to visit.    ROS Review of Systems  Constitutional: Positive for activity change and fatigue. Negative for appetite change and unexpected weight change.  HENT: Negative.   Eyes: Negative.   Respiratory: Positive for shortness of breath. Negative for cough, choking, chest tightness, wheezing and stridor.   Cardiovascular: Positive for leg swelling. Negative for chest pain and palpitations.  Gastrointestinal: Negative.  Negative for nausea, vomiting, abdominal pain, diarrhea and constipation.  Endocrine: Negative.   Genitourinary: Negative.  Negative for difficulty urinating.  Musculoskeletal: Positive for myalgias, back pain, arthralgias and gait problem. Negative for joint swelling, neck pain and neck stiffness.  Skin: Negative.  Negative for color change and rash.  Allergic/Immunologic: Negative.   Neurological: Positive for dizziness, syncope, weakness and numbness. Negative for tremors, seizures, speech difficulty and headaches.  Hematological: Negative.   Psychiatric/Behavioral: Negative.     Objective:  BP 117/88 mmHg  Pulse 65  Temp(Src) 98.4 F (36.9 C) (Oral)  Resp 18  Ht 6' (1.829 m)  Wt 468 lb 12.8 oz  (212.646 kg)  BMI 63.57 kg/m2  SpO2 95%  BP Readings from Last 3 Encounters:  10/22/15 117/88  08/03/15 108/80  06/04/15 130/82    Wt Readings from Last 3 Encounters:  10/22/15 468 lb 12.8 oz (212.646 kg)  08/03/15 465 lb (210.923 kg)  06/04/15 456 lb (206.84 kg)    Physical Exam  Constitutional:  Non-toxic appearance. He has a sickly appearance. He appears ill. He appears distressed.  HENT:  Mouth/Throat: Oropharynx is clear and moist. No oropharyngeal exudate.  Eyes: Conjunctivae are normal. Right eye exhibits no discharge. Left eye exhibits no discharge. No scleral icterus.  Neck: Normal range of motion. Neck supple. No JVD present. No tracheal deviation present. No thyromegaly present.  Cardiovascular: Normal rate, regular rhythm, normal heart sounds and intact distal pulses.  Exam reveals no gallop and no friction rub.   No murmur heard. Pulmonary/Chest: Effort normal. No accessory muscle usage or stridor. No respiratory distress. He has decreased  breath sounds in the right lower field and the left lower field. He has no wheezes. He has no rales. He exhibits no tenderness.  Abdominal: Soft. Bowel sounds are normal. He exhibits no distension and no mass. There is no tenderness. There is no rebound and no guarding.  Musculoskeletal: He exhibits edema (1+ pitting edema in BLE) and tenderness.       Right shoulder: He exhibits decreased range of motion, bony tenderness and pain.       Left shoulder: He exhibits decreased range of motion, bony tenderness and pain.       Right elbow: He exhibits decreased range of motion.       Left elbow: He exhibits decreased range of motion.       Right hip: He exhibits decreased range of motion and decreased strength. He exhibits no swelling.       Left hip: He exhibits decreased range of motion and decreased strength.  Upon standing he winces in pain in his hips. With use of his upper extremities he winces in pain related to his shoulders and  elbows.  Lymphadenopathy:    He has no cervical adenopathy.  Neurological: He is alert. He has normal strength. He displays atrophy and abnormal reflex. He displays no tremor. A sensory deficit is present. No cranial nerve deficit. He exhibits abnormal muscle tone. He displays no seizure activity. Coordination and gait abnormal. He displays no Babinski's sign on the right side. He displays no Babinski's sign on the left side.  Reflex Scores:      Tricep reflexes are 0 on the right side and 0 on the left side.      Bicep reflexes are 0 on the right side and 0 on the left side.      Brachioradialis reflexes are 0 on the right side and 0 on the left side.      Patellar reflexes are 0 on the right side and 0 on the left side.      Achilles reflexes are 0 on the right side and 0 on the left side. BUE str 3/5 symm BLE str 1/5 symm There is diffuse muscle atrophy No sensation in his feet Stance and gait are ataxic He takes 2 steps before grabbing his walker  Skin: Skin is warm and dry. No rash noted. He is not diaphoretic. No erythema. No pallor.  Psychiatric: His speech is normal and behavior is normal. Thought content normal. His mood appears not anxious. Cognition and memory are normal. He does not exhibit a depressed mood.  Vitals reviewed.   Lab Results  Component Value Date   WBC 6.0 06/05/2015   HGB 11.0* 06/05/2015   HCT 34.0* 06/05/2015   PLT 249.0 06/05/2015   GLUCOSE 197* 06/05/2015   CHOL 132 06/05/2015   TRIG 112.0 06/05/2015   HDL 29.10* 06/05/2015   LDLDIRECT 136.8 12/12/2006   LDLCALC 80 06/05/2015   ALT 19 06/05/2015   AST 17 06/05/2015   NA 140 06/05/2015   K 4.3 06/05/2015   CL 104 06/05/2015   CREATININE 1.30 06/05/2015   BUN 17 06/05/2015   CO2 27 06/05/2015   TSH 0.89 06/05/2015   PSA 0.92 08/11/2014   HGBA1C 10.2* 06/05/2015   MICROALBUR 2.7* 12/31/2014    Dg Abd Acute W/chest  01/14/2015  CLINICAL DATA:  Epigastric pain. EXAM: DG ABDOMEN ACUTE W/ 1V  CHEST COMPARISON:  CT 03/03/2014. Abdomen 12/17/2013. Chest x-ray 04/14/2011. FINDINGS: Mediastinum and hilar structures are normal. Cardiomegaly with mild  pulmonary vascular prominence and interstitial prominence. Mild congestive heart failure cannot be excluded. Active interstitial disease such as pneumonitis cannot be excluded. Prior lap band surgery. No bowel distention or free air. Stool present in colon. Postsurgical changes both hips. Surgical clips are present in the pelvis. Thoracolumbar and bilateral hip degenerative change. IMPRESSION: 1. Cardiomegaly with pulmonary vascular prominence interstitial prominence suggesting congestive heart failure. Active pneumonitis cannot be excluded. 2. Prior lap band surgery. No evidence of bowel distention. Stool noted throughout the colon. Electronically Signed   By: [name]  Register   On: 01/14/2015 12:46   Dg Foot Complete Left  01/14/2015  CLINICAL DATA:  Plantar surface foot pain.  No known injury. EXAM: LEFT FOOT - COMPLETE 3+ VIEW COMPARISON:  02/06/2014. FINDINGS: Diffuse degenerative changes noted of the left foot and ankle. No evidence of acute fracture or dislocation. Tiny bony densities noted adjacent to the malleoli are stable and may represent old fracture fragments. Soft tissue calcifications noted the ankle, most likely vascular. IMPRESSION: Diffuse degenerative change.  No acute bony abnormality identified. Electronically Signed   By: [name]  Register   On: 01/14/2015 12:49    Assessment & Plan:   Shun was seen today for follow-up.  Diagnoses and all orders for this visit:  Chronic diastolic CHF (congestive heart failure) ([place])  Essential hypertension  Type 1 diabetes mellitus with microalbuminuria (HCC)  Primary osteoarthritis of both hips  Low back pain without sciatica, unspecified back pain laterality  I am having Mr. Bornt maintain his vitamin C, vitamin E, aspirin EC, ferrous sulfate, cycloSPORINE, Multiple  Vitamins-Minerals (ZINC PO), polyethylene glycol powder, ZETIA, glucose blood, ULTICARE SHORT PEN NEEDLES, atorvastatin, Diclofenac Sodium, zinc gluconate, cholecalciferol, ferrous sulfate, bisoprolol, HYDROcodone-acetaminophen, bisoprolol, furosemide, losartan, and levothyroxine.  No orders of the defined types were placed in this encounter.     Follow-up: Return in about 6 months (around 04/20/2016).  Scarlette Calico, MD

## 2015-10-30 NOTE — Assessment & Plan Note (Signed)
To help control pain, and application for power wheelchair is been completed.

## 2015-10-30 NOTE — Telephone Encounter (Signed)
Would like to confirm you received request for script for motorized wheelchair?

## 2015-10-30 NOTE — Telephone Encounter (Signed)
This order has been faxed

## 2015-10-30 NOTE — Assessment & Plan Note (Signed)
To help relieve pain, and application for a power wheelchair is been completed

## 2015-10-30 NOTE — Assessment & Plan Note (Signed)
Application for power wheelchair completed to help control pitting edema

## 2015-10-30 NOTE — Assessment & Plan Note (Signed)
He will continue to follow-up with endocrinology regarding this

## 2015-11-05 ENCOUNTER — Encounter: Payer: Self-pay | Admitting: *Deleted

## 2015-11-05 ENCOUNTER — Other Ambulatory Visit: Payer: Self-pay | Admitting: *Deleted

## 2015-11-05 NOTE — Patient Outreach (Signed)
[facility]) Care Management   11/05/2015  Matthey Fullard [birth year] [id number]  [name] is an 60 y.o. male  Subjective: Patient reports being a "little tired but feeling great". Patient mentioned about chronic pain to right hip and right arm related to osteoarthritis- taking Ibuprofen as needed.  Objective: BP 134/80 mmHg  Pulse 63  Resp 18  SpO2 92%   Review of Systems  Constitutional: Negative.        Weakness to legs per patient  HENT: Negative.   Eyes:       Wears eyeglasses- one eye is nearsighted and other eye is far sighted   Respiratory: Positive for shortness of breath. Negative for cough, sputum production and wheezing.        Lung sounds diminished to bases, otherwise clear Shortness of breath on exertion  Cardiovascular: Positive for leg swelling. Negative for chest pain.       Regular rate and rhythm  Edema to bilateral lower extremities, left greater than right  Gastrointestinal: Negative for abdominal pain.       Abdomen obese, soft and non-tender Positive bowel sounds   Genitourinary: Negative.   Musculoskeletal: Positive for joint pain and falls.       Chronic right hip and right arm pain related to osteoarthritis Legs "buckle up" at times causing falls Ambulates with rollator walker  Skin:       Dark discoloration to bilateral ankles and feet Dry bilateral lower extremities- grease applied per patient  Neurological: Negative.   Endo/Heme/Allergies: Negative.   Psychiatric/Behavioral: The patient is nervous/anxious.        Patient irritable and anxious at times    Physical Exam  Current Medications:   Current Outpatient Prescriptions  Medication Sig Dispense Refill  . aspirin EC 325 MG tablet Take 325 mg by mouth every evening.     Marland Kitchen atorvastatin (LIPITOR) 40 MG tablet TAKE 1 TABLET (40 MG TOTAL) BY MOUTH DAILY. 90 tablet 3  . bisacodyl (DULCOLAX) 5 MG EC tablet Take 5 mg by mouth daily as needed for moderate constipation.     . bisoprolol (ZEBETA) 10 MG tablet TAKE 1 TABLET (10 MG TOTAL) BY MOUTH EVERY EVENING. 90 tablet 3  . cholecalciferol (VITAMIN D) 1000 UNITS tablet Take 1,000 Units by mouth daily.    . cycloSPORINE (RESTASIS) 0.05 % ophthalmic emulsion Place 1 drop into both eyes 2 (two) times daily. 0.4 mL 5  . ferrous sulfate 325 (65 FE) MG tablet TAKE 1 TABLET BY MOUTH 2 TIMES DAILY WITH A MEAL. 60 tablet 6  . furosemide (LASIX) 40 MG tablet TAKE 1 TABLET (40 MG TOTAL) BY MOUTH 2 TIMES DAILY. 180 tablet 3  . glucose blood (ONETOUCH VERIO) test strip Use to check blood sugars three times day  Dx E10.9 100 each 12  . HYDROcodone-acetaminophen (NORCO) 10-325 MG tablet Take 1 tablet by mouth every 6 (six) hours as needed. 90 tablet 0  . ibuprofen (ADVIL,MOTRIN) 200 MG tablet Take 200 mg by mouth every 6 (six) hours as needed for mild pain.    Marland Kitchen LEVEMIR FLEXTOUCH 100 UNIT/ML Pen INJECT 2.4 MLS (240 UNITS TOTAL) INTO THE SKIN EVERY MORNING. 75 mL 3  . levothyroxine (SYNTHROID, LEVOTHROID) 50 MCG tablet TAKE 1 TABLET BY MOUTH DAILY BEFORE BREAKFAST. 90 tablet 2  . losartan (COZAAR) 25 MG tablet TAKE 1 TABLET (25 MG TOTAL) BY MOUTH DAILY. 90 tablet 3  . polyethylene glycol powder (GLYCOLAX/MIRALAX) powder Take 17 g by mouth daily as needed  for mild constipation. 255 g 11  . ULTICARE SHORT PEN NEEDLES 31G X 8 MM MISC Dx E10.9 Use one pen needle once daily with insulin 100 each 11  . vitamin C (ASCORBIC ACID) 500 MG tablet Take 1,000 mg by mouth 2 (two) times daily.     . vitamin E 200 UNIT capsule Take 400 Units by mouth every morning.     Marland Kitchen ZETIA 10 MG tablet Take 1 tablet (10 mg total) by mouth daily. 90 tablet 3  . bisoprolol (ZEBETA) 10 MG tablet TAKE 1 TABLET (10 MG TOTAL) BY MOUTH EVERY EVENING. (Patient not taking: Reported on 11/05/2015) 90 tablet 3  . Diclofenac Sodium (PENNSAID) 1.5 % SOLN Place 0.2 mLs onto the skin 3 (three) times daily. (Patient not taking: Reported on 11/05/2015) 150 mL 11  . ferrous  sulfate 325 (65 FE) MG tablet Take 325 mg by mouth 2 (two) times daily with a meal. Reported on 11/05/2015    . Multiple Vitamins-Minerals (ZINC PO) Take by mouth. Reported on 11/05/2015    . zinc gluconate 50 MG tablet Take 50 mg by mouth daily. Reported on 11/05/2015     No current facility-administered medications for this visit.    Functional Status:   In your present state of health, do you have any difficulty performing the following activities: 11/05/2015 10/09/2015  Hearing? - N  Vision? (No Data) N  Difficulty concentrating or making decisions? - N  Walking or climbing stairs? - Y  Dressing or bathing? - Y  Doing errands, shopping? - Y  Preparing Food and eating ? - Y  Using the Toilet? - Y  In the past six months, have you accidently leaked urine? - N  Do you have problems with loss of bowel control? - N  Managing your Medications? - N  Managing your Finances? - N  Housekeeping or managing your Housekeeping? (No Data) Y    Fall/Depression Screening:    PHQ 2/9 Scores 11/05/2015 09/24/2015 02/05/2015  PHQ - 2 Score 0 0 0    Assessment:   60 year old male seen for home evaluation and assessment of diabetes. Arrived at patient's home. Patient sitting on a recliner in his living room watching television.  He states that he is also waiting for staff from the powered wheelchair company to do measurements in relation to obtaining a powered wheelchair that he was approved for.  Patient reports that he lives alone and has no significant assistance available except from his pastor.  Care management coordinator introduced self and explained purpose of the visit. Patient mentioned to care management coordinator that he had [facility] services in the past. He states that he does not want his time wasted talking to people and "does not want people getting into my business". [facility] services and eligibility to the program was explained to patient and he verbalized that he "will try again". He states "I  have things that I will do and things I will not do".  Patient's rights and responsibilities discussed with patient. Obtained written consent from patient.  Patient states that he is independent with self care activities and just trying to do as much as he can, enough to survive. He verbalized he can't walk far in his house because of weak legs (that buckles up occasionally causing falls) and has arthritis to both hips but opted not to have surgery done. Patient uses a rolator walker for support to move around. He reports being approved for a powered wheelchair but  he can not afford the copay ($1800). He states he has fixed income and usually can not afford a lot. He states he is trying to use another company to get his powered wheelchair and someone came in to get measurements today.  Per patient, he has a private pay housekeeper coming once a week to clean his house.  Mr. Saulter states he manages his own medications and takes them as prescribed.  Patient has diagnosis of type 1 diabetes and self administers insulin (Levemir). According to patient, he checks his blood sugars twice a day but does not record results. He states that his blood sugar readings range from 90- 300. Encouraged patient to call provider for elevated readings and he told care management coordinator that he will call provider if blood sugar is 300 and above. Patient was given a [facility] calendar/ notebook on this home visit to write down blood sugar readings. Patient states he "will try to record". Encouraged patient to bring list of blood sugar readings to provider's appointment for review. Patient has a new glucometer ([employer]) provided by primary care provider on his recent follow-up visit but has not used it yet. He reports that his blood sugar this morning was 150. Patient refused to get his old glucometer to check his 7, 14 and 28 day average readings. Patient's last A1c is 10.2 (9/16). He reports occasional compliance to  diabetic diet but eats mostly regular food since it is hard to comply with limited/ fixed income. [facility] diabetes packet provided and encouraged patient to read and use it as a guide to manage diabetes. Patient refuse to see dietitian/ nutritionist or attend diabetes classes. He states, "you can refer me but I'm not going". He informed care management coordinator that he will resume doing exercises as what he did previously.  Per telephonic care management report, patient had refused referral to endocrinologist 4 times within the last year. Patient reports that he did not go back for follow-up with Dr. Lissa Merlin (endocrinologist) because staff treated him rudely. He states that "Dr. Lissa Merlin was nice, but the staff was not". He told care management coordinator that he will try to call and set-up an appointment to see Dr. Lissa Merlin when he gets enough money next month to pay for the visit. His last primary care provider's visit was 10/22/15.Per primary provider's report, patient's blood pressure is well- controlled. He mentions that he uses [employer] for transportation to his appointments.  Patient reports no immediate concerns at this time. Encouraged patient to call [facility] care management and 24-hour nurse line if needed. Contact information provided. Patient agreed to home visit next month.    Plan: Routine home visit on 12/17/15   [facility] CM Care Plan Problem One        Most Recent Value   Care Plan Problem One  knowledge deficit related to diabetes management   Role Documenting the Problem One  Care Management Coordinator   Care Plan for Problem One  Active   [facility] Long Term Goal (31-90 days)  patient's blood sugar reading will average at 200 and below as recorded in the next 60 days   [facility] Long Term Goal Start Date  11/05/15   Interventions for Problem One Long Term Goal  remind patient to check blood sugar daily and record,  provide [facility] calendar/ notebook to record blood sugar results,  encourage patient to adhere to  diabetic diet by monitoring carbohydrate intake  or carb counting,  emphasize adherence to medications (insulin),  encourage patient to set-up appointment with endocrinologist to help manage diabetes    [facility] CM Short Term Goal #1 (0-30 days)  patient will monitor blood sugar daily and record in the next 30 days   [facility] CM Short Term Goal #1 Start Date  11/05/15   Interventions for Short Term Goal #1  encourage patient to check blood sugar daily and record,  provide [facility] calendar or notebook to document results,  encourage patient to bring list of blood sugar readings to provider's  follow-up appointment  for review    [facility] CM Short Term Goal #2 (0-30 days)  patient will report calling provider for blood sugar readings 300 and above in the next 30 days    [facility] CM Short Term Goal #2 Start Date  11/05/15   Interventions for Short Term Goal #2  advise patient to monitor blood sugar daily and record,  instruct patient to call provider and report blood sugar readings of 300 or greater for further interventions,    [facility] CM Short Term Goal #3 (0-30 days)  patient will set-up a follow-up appointment with endocrinologist in the next 30 days   [facility] CM Short Term Goal #3 Start Date  11/05/15   Interventions for Short Tern Goal #3  explain to patient the importance and benefits of following-up with endocrinologist to help manage diabetes,  advise patient to call and schedule an appointment to be seen by endocrinologist for further diabetes management,  remind to attend scheduled appointment  with provider   [facility] CM Short Term Goal #4 (0-30 days)  patient will report starting to do exercises in the next 30 days   [facility] CM Short Term Goal #4 Start Date  11/05/15   Interventions for Short Term Goal #4  encourage patient to perform exercises as tolerated,  explain the benefits and importance of exercise in relation to diabetes/ elevated blood sugar,  advise to start slow to build endurance then increase exercises gradually as  tolerated      Edwena Felty A. Xaivier Malay, BSN, RN-BC [place] Management Coordinator Cell: [phone]

## 2015-11-06 ENCOUNTER — Encounter: Payer: Self-pay | Admitting: *Deleted

## 2015-11-19 ENCOUNTER — Other Ambulatory Visit: Payer: Self-pay | Admitting: Internal Medicine

## 2015-11-20 ENCOUNTER — Telehealth: Payer: Self-pay | Admitting: Internal Medicine

## 2015-11-20 ENCOUNTER — Other Ambulatory Visit: Payer: Self-pay

## 2015-11-20 MED ORDER — GLUCOSE BLOOD VI STRP: ORAL_STRIP | Status: DC

## 2015-11-20 NOTE — Telephone Encounter (Signed)
Form received will have to wait for MD to return for his signature

## 2015-11-20 NOTE — Telephone Encounter (Signed)
Would like to know if you received Detail Product Description form?  MD needs to sign, date and fax back.

## 2015-11-23 NOTE — Telephone Encounter (Signed)
Form faxed

## 2015-11-26 ENCOUNTER — Encounter: Payer: Self-pay | Admitting: Internal Medicine

## 2015-11-26 ENCOUNTER — Ambulatory Visit (INDEPENDENT_AMBULATORY_CARE_PROVIDER_SITE_OTHER): Payer: Commercial Managed Care - HMO | Admitting: Internal Medicine

## 2015-11-26 ENCOUNTER — Ambulatory Visit (INDEPENDENT_AMBULATORY_CARE_PROVIDER_SITE_OTHER)
Admission: RE | Admit: 2015-11-26 | Discharge: 2015-11-26 | Disposition: A | Discharge status: Home / Self Care | Payer: Commercial Managed Care - HMO | Source: Ambulatory Visit | Attending: Internal Medicine | Admitting: Internal Medicine

## 2015-11-26 VITALS — BP 152/80 | HR 66 | Temp 98.5°F | Resp 20 | Ht 72.0 in | Wt >= 6400 oz

## 2015-11-26 DIAGNOSIS — J189 Pneumonia, unspecified organism: Secondary | ICD-10-CM

## 2015-11-26 DIAGNOSIS — R05 Cough: Secondary | ICD-10-CM | POA: Diagnosis not present

## 2015-11-26 MED ORDER — LEVOFLOXACIN 750 MG PO TABS: 750.0000 mg | ORAL_TABLET | Freq: Every day | ORAL | Status: AC

## 2015-11-26 MED ORDER — PROMETHAZINE-DM 6.25-15 MG/5ML PO SYRP: 5.0000 mL | ORAL_SOLUTION | Freq: Four times a day (QID) | ORAL | Status: DC | PRN

## 2015-11-26 NOTE — Patient Instructions (Signed)

## 2015-11-26 NOTE — Progress Notes (Signed)
Pre visit review using our clinic review tool, if applicable. No additional management support is needed unless otherwise documented below in the visit note. 

## 2015-11-28 NOTE — Progress Notes (Signed)
Subjective:  Patient ID: [name], male    DOB: 10/06/1955  Age: 60 y.o. MRN: [id number]  CC: Cough   HPI [name] presents for a six-day history of cough productive of thick brown phlegm with low-grade fever, chills, and sore throat.  Outpatient Prescriptions Prior to Visit  Medication Sig Dispense Refill  . aspirin EC 325 MG tablet Take 325 mg by mouth every evening.     Marland Kitchen atorvastatin (LIPITOR) 40 MG tablet TAKE 1 TABLET (40 MG TOTAL) BY MOUTH DAILY. 90 tablet 3  . bisacodyl (DULCOLAX) 5 MG EC tablet Take 5 mg by mouth daily as needed for moderate constipation.    . bisoprolol (ZEBETA) 10 MG tablet TAKE 1 TABLET (10 MG TOTAL) BY MOUTH EVERY EVENING. 90 tablet 3  . bisoprolol (ZEBETA) 10 MG tablet TAKE 1 TABLET (10 MG TOTAL) BY MOUTH EVERY EVENING. 90 tablet 3  . cholecalciferol (VITAMIN D) 1000 UNITS tablet Take 1,000 Units by mouth daily.    . cycloSPORINE (RESTASIS) 0.05 % ophthalmic emulsion Place 1 drop into both eyes 2 (two) times daily. 0.4 mL 5  . Diclofenac Sodium (PENNSAID) 1.5 % SOLN Place 0.2 mLs onto the skin 3 (three) times daily. 150 mL 11  . ferrous sulfate 325 (65 FE) MG tablet Take 325 mg by mouth 2 (two) times daily with a meal. Reported on 11/05/2015    . ferrous sulfate 325 (65 FE) MG tablet TAKE 1 TABLET BY MOUTH 2 TIMES DAILY WITH A MEAL. 60 tablet 6  . furosemide (LASIX) 40 MG tablet TAKE 1 TABLET (40 MG TOTAL) BY MOUTH 2 TIMES DAILY. 180 tablet 3  . glucose blood (ONETOUCH VERIO) test strip Use to check blood sugar up to three times a day DX E10.29 300 each 1  . HYDROcodone-acetaminophen (NORCO) 10-325 MG tablet Take 1 tablet by mouth every 6 (six) hours as needed. 90 tablet 0  . ibuprofen (ADVIL,MOTRIN) 200 MG tablet Take 200 mg by mouth every 6 (six) hours as needed for mild pain.    Marland Kitchen LEVEMIR FLEXTOUCH 100 UNIT/ML Pen INJECT 2.4 MLS (240 UNITS TOTAL) INTO THE SKIN EVERY MORNING. 75 mL 3  . levothyroxine (SYNTHROID, LEVOTHROID) 50 MCG tablet TAKE 1  TABLET BY MOUTH DAILY BEFORE BREAKFAST. 90 tablet 2  . losartan (COZAAR) 25 MG tablet TAKE 1 TABLET (25 MG TOTAL) BY MOUTH DAILY. 90 tablet 3  . Multiple Vitamins-Minerals (ZINC PO) Take by mouth. Reported on 11/05/2015    . polyethylene glycol powder (GLYCOLAX/MIRALAX) powder Take 17 g by mouth daily as needed for mild constipation. 255 g 11  . ULTICARE SHORT PEN NEEDLES 31G X 8 MM MISC Dx E10.9 Use one pen needle once daily with insulin 100 each 11  . vitamin C (ASCORBIC ACID) 500 MG tablet Take 1,000 mg by mouth 2 (two) times daily.     . vitamin E 200 UNIT capsule Take 400 Units by mouth every morning.     . zinc gluconate 50 MG tablet Take 50 mg by mouth daily. Reported on 11/05/2015    . ZETIA 10 MG tablet Take 1 tablet (10 mg total) by mouth daily. 90 tablet 3   No facility-administered medications prior to visit.    ROS Review of Systems  Constitutional: Positive for fever and chills. Negative for diaphoresis, activity change, appetite change, fatigue and unexpected weight change.  HENT: Positive for sore throat. Negative for facial swelling, rhinorrhea, sinus pressure and trouble swallowing.   Eyes: Negative.   Respiratory:  Positive for cough. Negative for choking, chest tightness, shortness of breath and stridor.   Cardiovascular: Negative.  Negative for chest pain, palpitations and leg swelling.  Gastrointestinal: Negative.  Negative for nausea, vomiting, abdominal pain, diarrhea and constipation.  Endocrine: Negative.   Genitourinary: Negative.  Negative for difficulty urinating.  Musculoskeletal: Negative.  Negative for myalgias, back pain, arthralgias and neck pain.  Skin: Negative.  Negative for color change and rash.  Allergic/Immunologic: Negative.   Neurological: Negative.  Negative for dizziness, tremors, weakness, light-headedness and headaches.  Hematological: Negative.  Negative for adenopathy. Does not bruise/bleed easily.  Psychiatric/Behavioral: Negative.      Objective:  BP 152/80 mmHg  Pulse 66  Temp(Src) 98.5 F (36.9 C) (Oral)  Resp 20  Ht 6' (1.829 m)  Wt 460 lb (208.655 kg)  BMI 62.37 kg/m2  SpO2 96%  BP Readings from Last 3 Encounters:  11/26/15 152/80  11/05/15 134/80  10/22/15 117/88    Wt Readings from Last 3 Encounters:  11/26/15 460 lb (208.655 kg)  10/22/15 468 lb 12.8 oz (212.646 kg)  08/03/15 465 lb (210.923 kg)    Physical Exam  Constitutional: He is oriented to person, place, and time.  Non-toxic appearance. He does not have a sickly appearance. He does not appear ill. No distress.  HENT:  Mouth/Throat: Oropharynx is clear and moist. No oropharyngeal exudate.  Eyes: Conjunctivae are normal. Right eye exhibits no discharge. Left eye exhibits no discharge. No scleral icterus.  Neck: Normal range of motion. Neck supple. No JVD present. No tracheal deviation present. No thyromegaly present.  Cardiovascular: Normal rate, regular rhythm, normal heart sounds and intact distal pulses.  Exam reveals no gallop and no friction rub.   No murmur heard. Pulmonary/Chest: Effort normal and breath sounds normal. No stridor. No respiratory distress. He has no wheezes. He has no rales. He exhibits no tenderness.  Abdominal: Soft. Bowel sounds are normal. He exhibits no distension and no mass. There is no tenderness. There is no rebound and no guarding.  Musculoskeletal: Normal range of motion. He exhibits edema (2+ edema in BLE). He exhibits no tenderness.  Lymphadenopathy:    He has no cervical adenopathy.  Neurological: He is oriented to person, place, and time.  Skin: Skin is warm and dry. No rash noted. He is not diaphoretic. No erythema. No pallor.  Vitals reviewed.   Lab Results  Component Value Date   WBC 6.0 06/05/2015   HGB 11.0* 06/05/2015   HCT 34.0* 06/05/2015   PLT 249.0 06/05/2015   GLUCOSE 197* 06/05/2015   CHOL 132 06/05/2015   TRIG 112.0 06/05/2015   HDL 29.10* 06/05/2015   LDLDIRECT 136.8 12/12/2006    LDLCALC 80 06/05/2015   ALT 19 06/05/2015   AST 17 06/05/2015   NA 140 06/05/2015   K 4.3 06/05/2015   CL 104 06/05/2015   CREATININE 1.30 06/05/2015   BUN 17 06/05/2015   CO2 27 06/05/2015   TSH 0.89 06/05/2015   PSA 0.92 08/11/2014   HGBA1C 10.2* 06/05/2015   MICROALBUR 2.7* 12/31/2014    Dg Chest 2 View  11/26/2015  CLINICAL DATA:  Sore throat, congestion, cough for 6 days, hypertension, CHF, diabetes mellitus EXAM: CHEST  2 VIEW COMPARISON:  01/14/2015 FINDINGS: Upper normal heart size. Question minimally prominent central pulmonary arteries. Mediastinal contours normal. Mild bibasilar atelectasis. No acute infiltrate, pleural effusion or pneumothorax. Bones unremarkable. IMPRESSION: Bibasilar atelectasis. Electronically Signed   By: Lavonia Dana M.D.   On: 11/26/2015 17:55  Assessment & Plan:   Giovanne was seen today for cough.  Diagnoses and all orders for this visit:  CAP (community acquired pneumonia)- his exam and chest x-ray are normal however he has signs and symptoms suspicious for pneumonia, will treat with a 5 day course of fluoroquinolone and will offer Phenergan DM as needed for the cough. -     levofloxacin (LEVAQUIN) 750 MG tablet; Take 1 tablet (750 mg total) by mouth daily. -     promethazine-dextromethorphan (PROMETHAZINE-DM) 6.25-15 MG/5ML syrup; Take 5 mLs by mouth 4 (four) times daily as needed for cough. -     DG Chest 2 View; Future   I have discontinued Mr. Averett Maggio. I am also having him start on levofloxacin and promethazine-dextromethorphan. Additionally, I am having him maintain his vitamin C, vitamin E, aspirin EC, ferrous sulfate, cycloSPORINE, Multiple Vitamins-Minerals (ZINC PO), polyethylene glycol powder, ULTICARE SHORT PEN NEEDLES, atorvastatin, Diclofenac Sodium, zinc gluconate, cholecalciferol, ferrous sulfate, bisoprolol, HYDROcodone-acetaminophen, bisoprolol, furosemide, losartan, levothyroxine, LEVEMIR FLEXTOUCH, ibuprofen, bisacodyl,  and glucose blood.  Meds ordered this encounter  Medications  . levofloxacin (LEVAQUIN) 750 MG tablet    Sig: Take 1 tablet (750 mg total) by mouth daily.    Dispense:  5 tablet    Refill:  0  . promethazine-dextromethorphan (PROMETHAZINE-DM) 6.25-15 MG/5ML syrup    Sig: Take 5 mLs by mouth 4 (four) times daily as needed for cough.    Dispense:  118 mL    Refill:  0     Follow-up: Return in about 3 weeks (around 12/17/2015).  Scarlette Calico, MD

## 2015-11-30 ENCOUNTER — Telehealth: Payer: Self-pay

## 2015-11-30 NOTE — Telephone Encounter (Signed)
[employer] medical forms received. Unfortunately, MD was not able to certify pt's ability to drive based on his physical condition.  These forms were mailed back to pt

## 2015-12-02 ENCOUNTER — Other Ambulatory Visit: Payer: Self-pay | Admitting: Internal Medicine

## 2015-12-08 ENCOUNTER — Encounter: Payer: Self-pay | Admitting: Internal Medicine

## 2015-12-08 ENCOUNTER — Other Ambulatory Visit (INDEPENDENT_AMBULATORY_CARE_PROVIDER_SITE_OTHER): Payer: Commercial Managed Care - HMO

## 2015-12-08 ENCOUNTER — Ambulatory Visit (INDEPENDENT_AMBULATORY_CARE_PROVIDER_SITE_OTHER): Payer: Commercial Managed Care - HMO | Admitting: Internal Medicine

## 2015-12-08 VITALS — BP 140/80 | HR 77 | Temp 98.2°F | Resp 16 | Ht 72.0 in | Wt >= 6400 oz

## 2015-12-08 DIAGNOSIS — E118 Type 2 diabetes mellitus with unspecified complications: Secondary | ICD-10-CM

## 2015-12-08 DIAGNOSIS — E038 Other specified hypothyroidism: Secondary | ICD-10-CM

## 2015-12-08 DIAGNOSIS — M150 Primary generalized (osteo)arthritis: Secondary | ICD-10-CM | POA: Diagnosis not present

## 2015-12-08 DIAGNOSIS — I10 Essential (primary) hypertension: Secondary | ICD-10-CM

## 2015-12-08 DIAGNOSIS — J189 Pneumonia, unspecified organism: Secondary | ICD-10-CM

## 2015-12-08 DIAGNOSIS — E0821 Diabetes mellitus due to underlying condition with diabetic nephropathy: Secondary | ICD-10-CM | POA: Diagnosis not present

## 2015-12-08 DIAGNOSIS — E1142 Type 2 diabetes mellitus with diabetic polyneuropathy: Secondary | ICD-10-CM

## 2015-12-08 DIAGNOSIS — M545 Low back pain: Secondary | ICD-10-CM

## 2015-12-08 DIAGNOSIS — E1121 Type 2 diabetes mellitus with diabetic nephropathy: Secondary | ICD-10-CM

## 2015-12-08 DIAGNOSIS — D509 Iron deficiency anemia, unspecified: Secondary | ICD-10-CM | POA: Diagnosis not present

## 2015-12-08 DIAGNOSIS — M159 Polyosteoarthritis, unspecified: Secondary | ICD-10-CM

## 2015-12-08 DIAGNOSIS — Z794 Long term (current) use of insulin: Secondary | ICD-10-CM

## 2015-12-08 LAB — CBC WITH DIFFERENTIAL/PLATELET
BASOS ABS: 0 10*3/uL (ref 0.0–0.1)
Basophils Relative: 0.5 % (ref 0.0–3.0)
EOS ABS: 0.1 10*3/uL (ref 0.0–0.7)
Eosinophils Relative: 1.7 % (ref 0.0–5.0)
HCT: 32.7 % — ABNORMAL LOW (ref 39.0–52.0)
Hemoglobin: 10.6 g/dL — ABNORMAL LOW (ref 13.0–17.0)
LYMPHS ABS: 1.9 10*3/uL (ref 0.7–4.0)
Lymphocytes Relative: 22.7 % (ref 12.0–46.0)
MCHC: 32.3 g/dL (ref 30.0–36.0)
MCV: 83.1 fl (ref 78.0–100.0)
MONO ABS: 0.5 10*3/uL (ref 0.1–1.0)
Monocytes Relative: 6.4 % (ref 3.0–12.0)
NEUTROS PCT: 68.7 % (ref 43.0–77.0)
Neutro Abs: 5.6 10*3/uL (ref 1.4–7.7)
Platelets: 248 10*3/uL (ref 150.0–400.0)
RBC: 3.94 Mil/uL — ABNORMAL LOW (ref 4.22–5.81)
RDW: 15.3 % (ref 11.5–15.5)
WBC: 8.2 10*3/uL (ref 4.0–10.5)

## 2015-12-08 LAB — URINALYSIS, ROUTINE W REFLEX MICROSCOPIC
Bilirubin Urine: NEGATIVE
Ketones, ur: NEGATIVE
LEUKOCYTES UA: NEGATIVE
Nitrite: NEGATIVE
PH: 6 (ref 5.0–8.0)
RBC / HPF: NONE SEEN (ref 0–?)
SPECIFIC GRAVITY, URINE: 1.01 (ref 1.000–1.030)
TOTAL PROTEIN, URINE-UPE24: NEGATIVE
Urine Glucose: NEGATIVE
Urobilinogen, UA: 0.2 (ref 0.0–1.0)
WBC, UA: NONE SEEN (ref 0–?)

## 2015-12-08 LAB — COMPREHENSIVE METABOLIC PANEL
ALK PHOS: 134 U/L — AB (ref 39–117)
ALT: 24 U/L (ref 0–53)
AST: 23 U/L (ref 0–37)
Albumin: 3.6 g/dL (ref 3.5–5.2)
BILIRUBIN TOTAL: 0.4 mg/dL (ref 0.2–1.2)
BUN: 20 mg/dL (ref 6–23)
CO2: 29 mEq/L (ref 19–32)
CREATININE: 1.5 mg/dL (ref 0.40–1.50)
Calcium: 8.9 mg/dL (ref 8.4–10.5)
Chloride: 104 mEq/L (ref 96–112)
GFR: 61.8 mL/min (ref >60.00)
GLUCOSE: 140 mg/dL — AB (ref 70–99)
Potassium: 4.2 mEq/L (ref 3.5–5.1)
SODIUM: 141 meq/L (ref 135–145)
TOTAL PROTEIN: 7.1 g/dL (ref 6.0–8.3)

## 2015-12-08 LAB — FERRITIN: FERRITIN: 287 ng/mL (ref 22.0–322.0)

## 2015-12-08 LAB — TSH: TSH: 1.18 u[IU]/mL (ref 0.35–4.50)

## 2015-12-08 LAB — IBC PANEL
Iron: 31 ug/dL — ABNORMAL LOW (ref 42–165)
SATURATION RATIOS: 10.6 % — AB (ref 20.0–50.0)
TRANSFERRIN: 209 mg/dL — AB (ref 212.0–360.0)

## 2015-12-08 LAB — HEMOGLOBIN A1C: HEMOGLOBIN A1C: 9.8 % — AB (ref 4.6–6.5)

## 2015-12-08 LAB — MICROALBUMIN / CREATININE URINE RATIO
CREATININE, U: 118.4 mg/dL
MICROALB/CREAT RATIO: 4.6 mg/g (ref 0.0–30.0)
Microalb, Ur: 5.5 mg/dL — ABNORMAL HIGH (ref 0.0–1.9)

## 2015-12-08 MED ORDER — EMPAGLIFLOZIN-METFORMIN HCL 5-1000 MG PO TABS: 1.0000 | ORAL_TABLET | Freq: Two times a day (BID) | ORAL | Status: DC

## 2015-12-08 MED ORDER — HYDROCODONE-ACETAMINOPHEN 10-325 MG PO TABS: 1.0000 | ORAL_TABLET | Freq: Four times a day (QID) | ORAL | Status: DC | PRN

## 2015-12-08 MED ORDER — FERROUS SULFATE 325 (65 FE) MG PO TABS: 325.0000 mg | ORAL_TABLET | Freq: Two times a day (BID) | ORAL | Status: DC

## 2015-12-08 NOTE — Progress Notes (Signed)
Subjective:  Patient ID: [name], male    DOB: [birth year]  Age: 60 y.o. MRN: [id number]  CC: Diabetes; Cough; Osteoarthritis; Back Pain; Hypertension; Hypothyroidism; and Anemia   HPI Buddy Bergren presents for a f/up on multiple medical concerns. He was treated for pneumonia a few weeks ago and tells me that he is feeling much better. His cough has significantly diminished and it is nonproductive. He denies shortness of breath, fever, chills, hemoptysis, or sore throat.  He is also due for follow-up on diabetes. He does not monitor his blood sugar. He denies polyuria/polydipsia/polyphagia. He is only been using insulin to control his blood sugar and he doesn't know why he has not been on any oral medications. He is being treated for diabetic peripheral neuropathy with Norco.  Outpatient Prescriptions Prior to Visit  Medication Sig Dispense Refill  . aspirin EC 325 MG tablet Take 325 mg by mouth every evening.     Marland Kitchen atorvastatin (LIPITOR) 40 MG tablet TAKE 1 TABLET (40 MG TOTAL) BY MOUTH DAILY. 90 tablet 3  . bisacodyl (DULCOLAX) 5 MG EC tablet Take 5 mg by mouth daily as needed for moderate constipation.    . bisoprolol (ZEBETA) 10 MG tablet TAKE 1 TABLET (10 MG TOTAL) BY MOUTH EVERY EVENING. 90 tablet 3  . cholecalciferol (VITAMIN D) 1000 UNITS tablet Take 1,000 Units by mouth daily.    . cycloSPORINE (RESTASIS) 0.05 % ophthalmic emulsion Place 1 drop into both eyes 2 (two) times daily. 0.4 mL 5  . Diclofenac Sodium (PENNSAID) 1.5 % SOLN Place 0.2 mLs onto the skin 3 (three) times daily. 150 mL 11  . furosemide (LASIX) 40 MG tablet TAKE 1 TABLET (40 MG TOTAL) BY MOUTH 2 TIMES DAILY. 180 tablet 3  . glucose blood (ONETOUCH VERIO) test strip Use to check blood sugar up to three times a day DX E10.29 300 each 1  . ibuprofen (ADVIL,MOTRIN) 200 MG tablet Take 200 mg by mouth every 6 (six) hours as needed for mild pain.    Marland Kitchen LEVEMIR FLEXTOUCH 100 UNIT/ML Pen INJECT 2.4 MLS (240 UNITS  TOTAL) INTO THE SKIN EVERY MORNING. 75 mL 3  . levothyroxine (SYNTHROID, LEVOTHROID) 50 MCG tablet TAKE 1 TABLET BY MOUTH DAILY BEFORE BREAKFAST. 90 tablet 2  . losartan (COZAAR) 25 MG tablet TAKE 1 TABLET (25 MG TOTAL) BY MOUTH DAILY. 90 tablet 3  . Multiple Vitamins-Minerals (ZINC PO) Take by mouth. Reported on 11/05/2015    . polyethylene glycol powder (GLYCOLAX/MIRALAX) powder Take 17 g by mouth daily as needed for mild constipation. 255 g 11  . ULTICARE SHORT PEN NEEDLES 31G X 8 MM MISC Dx E10.9 Use one pen needle once daily with insulin 100 each 11  . ULTICARE SHORT PEN NEEDLES 31G X 8 MM MISC Use with insulin pen daily E11.9 100 each 3  . vitamin C (ASCORBIC ACID) 500 MG tablet Take 1,000 mg by mouth 2 (two) times daily.     . vitamin E 200 UNIT capsule Take 400 Units by mouth every morning.     . zinc gluconate 50 MG tablet Take 50 mg by mouth daily. Reported on 11/05/2015    . bisoprolol (ZEBETA) 10 MG tablet TAKE 1 TABLET (10 MG TOTAL) BY MOUTH EVERY EVENING. 90 tablet 3  . ferrous sulfate 325 (65 FE) MG tablet Take 325 mg by mouth 2 (two) times daily with a meal. Reported on 11/05/2015    . ferrous sulfate 325 (65 FE) MG tablet TAKE 1  TABLET BY MOUTH 2 TIMES DAILY WITH A MEAL. 60 tablet 6  . HYDROcodone-acetaminophen (NORCO) 10-325 MG tablet Take 1 tablet by mouth every 6 (six) hours as needed. 90 tablet 0  . promethazine-dextromethorphan (PROMETHAZINE-DM) 6.25-15 MG/5ML syrup Take 5 mLs by mouth 4 (four) times daily as needed for cough. (Patient not taking: Reported on 12/08/2015) 118 mL 0   No facility-administered medications prior to visit.    ROS Review of Systems  Constitutional: Negative.  Negative for fever, chills, diaphoresis, appetite change and fatigue.  HENT: Negative.   Eyes: Negative.   Respiratory: Negative.  Negative for cough, choking, chest tightness, shortness of breath and stridor.   Cardiovascular: Negative.  Negative for chest pain, palpitations and leg  swelling.  Gastrointestinal: Negative.  Negative for nausea, vomiting, abdominal pain, diarrhea, constipation and blood in stool.  Endocrine: Negative.  Negative for polydipsia, polyphagia and polyuria.  Genitourinary: Negative.   Musculoskeletal: Positive for back pain and arthralgias. Negative for myalgias, joint swelling, gait problem and neck pain.  Skin: Negative.  Negative for color change, pallor and rash.  Allergic/Immunologic: Negative.   Neurological: Negative.  Negative for dizziness, tremors, weakness, light-headedness, numbness and headaches.  Hematological: Negative.  Negative for adenopathy. Does not bruise/bleed easily.  Psychiatric/Behavioral: Negative.     Objective:  BP 140/80 mmHg  Pulse 77  Temp(Src) 98.2 F (36.8 C) (Oral)  Resp 16  Ht 6' (1.829 m)  Wt 479 lb (217.273 kg)  BMI 64.95 kg/m2  SpO2 92%  BP Readings from Last 3 Encounters:  12/08/15 140/80  11/26/15 152/80  11/05/15 134/80    Wt Readings from Last 3 Encounters:  12/08/15 479 lb (217.273 kg)  11/26/15 460 lb (208.655 kg)  10/22/15 468 lb 12.8 oz (212.646 kg)    Physical Exam  Constitutional: He is oriented to person, place, and time.  Non-toxic appearance. He has a sickly appearance. He does not appear ill. No distress.  Morbidly obese, uses a walker for ambulation  HENT:  Mouth/Throat: Oropharynx is clear and moist. No oropharyngeal exudate.  Eyes: Conjunctivae are normal. Right eye exhibits no discharge. Left eye exhibits no discharge. No scleral icterus.  Neck: Normal range of motion. Neck supple. No JVD present. No tracheal deviation present. No thyromegaly present.  Cardiovascular: Normal rate, regular rhythm, normal heart sounds and intact distal pulses.  Exam reveals no gallop and no friction rub.   No murmur heard. Pulmonary/Chest: Effort normal and breath sounds normal. No stridor. No respiratory distress. He has no wheezes. He has no rales. He exhibits no tenderness.  Abdominal:  Soft. Bowel sounds are normal. He exhibits no distension and no mass. There is no tenderness. There is no rebound and no guarding.  Musculoskeletal: Normal range of motion. He exhibits no edema or tenderness.  Lymphadenopathy:    He has no cervical adenopathy.  Neurological: He is oriented to person, place, and time.  Skin: Skin is warm and dry. No rash noted. He is not diaphoretic. No erythema. No pallor.  Psychiatric: He has a normal mood and affect. His behavior is normal. Judgment and thought content normal.  Vitals reviewed.   Lab Results  Component Value Date   WBC 8.2 12/08/2015   HGB 10.6* 12/08/2015   HCT 32.7* 12/08/2015   PLT 248.0 12/08/2015   GLUCOSE 140* 12/08/2015   CHOL 132 06/05/2015   TRIG 112.0 06/05/2015   HDL 29.10* 06/05/2015   LDLDIRECT 136.8 12/12/2006   LDLCALC 80 06/05/2015   ALT 24 12/08/2015  AST 23 12/08/2015   NA 141 12/08/2015   K 4.2 12/08/2015   CL 104 12/08/2015   CREATININE 1.50 12/08/2015   BUN 20 12/08/2015   CO2 29 12/08/2015   TSH 1.18 12/08/2015   PSA 0.92 08/11/2014   HGBA1C 9.8* 12/08/2015   MICROALBUR 5.5* 12/08/2015    Dg Chest 2 View  11/26/2015  CLINICAL DATA:  Sore throat, congestion, cough for 6 days, hypertension, CHF, diabetes mellitus EXAM: CHEST  2 VIEW COMPARISON:  01/14/2015 FINDINGS: Upper normal heart size. Question minimally prominent central pulmonary arteries. Mediastinal contours normal. Mild bibasilar atelectasis. No acute infiltrate, pleural effusion or pneumothorax. Bones unremarkable. IMPRESSION: Bibasilar atelectasis. Electronically Signed   By: Lavonia Dana M.D.   On: 11/26/2015 17:55    Assessment & Plan:   Cyrus was seen today for diabetes, cough, osteoarthritis, back pain, hypertension, hypothyroidism and anemia.  Diagnoses and all orders for this visit:  Low back pain without sciatica, unspecified back pain laterality -     HYDROcodone-acetaminophen (NORCO) 10-325 MG tablet; Take 1 tablet by mouth  every 6 (six) hours as needed.  Diabetic nephropathy associated with diabetes mellitus due to underlying condition (HCC) -     HYDROcodone-acetaminophen (NORCO) 10-325 MG tablet; Take 1 tablet by mouth every 6 (six) hours as needed. -     Comprehensive metabolic panel; Future -     Hemoglobin A1c; Future -     Urinalysis, Routine w reflex microscopic (not at [facility]); Future -     Microalbumin / creatinine urine ratio; Future  Primary osteoarthritis involving multiple joints -     HYDROcodone-acetaminophen (NORCO) 10-325 MG tablet; Take 1 tablet by mouth every 6 (six) hours as needed.  CAP (community acquired pneumonia)- improvement noted  Other specified hypothyroidism- his TSH is in the normal range, he will remain on the current dose -     TSH; Future  Iron deficiency anemia- his iron level is somewhat improved compared to the prior testing but is not in the normal range, he remains anemic, I've asked him to restart iron therapy will increase to twice a day. -     CBC with Differential/Platelet; Future -     IBC panel; Future -     Ferritin; Future -     ferrous sulfate 325 (65 FE) MG tablet; Take 1 tablet (325 mg total) by mouth 2 (two) times daily with a meal. Reported on 11/05/2015  Essential hypertension- his blood pressures well controlled, electrolytes and renal function are stable. -     Comprehensive metabolic panel; Future -     Urinalysis, Routine w reflex microscopic (not at [facility]); Future  Diabetic peripheral neuropathy ([place])- we'll continue Norco as needed for pain -     HYDROcodone-acetaminophen (NORCO) 10-325 MG tablet; Take 1 tablet by mouth every 6 (six) hours as needed.  Type 2 diabetes mellitus with complication, with long-term current use of insulin ([place])- his A1c is 9.8%, blood sugars are not well-controlled, I have decided to add metformin and an SGOT 2 inhibitor to his insulin regimen -     Empagliflozin-Metformin HCl (SYNJARDY) 01-999 MG TABS; Take 1 tablet by  mouth 2 (two) times daily.  Other orders -     Cancel: promethazine-dextromethorphan (PROMETHAZINE-DM) 6.25-15 MG/5ML syrup; Take 5 mLs by mouth 4 (four) times daily as needed for cough.   I have discontinued Mr. Vandruff ferrous sulfate and promethazine-dextromethorphan. I have also changed his ferrous sulfate. Additionally, I am having him start on Empagliflozin-Metformin HCl. Lastly,  I am having him maintain his vitamin C, vitamin E, aspirin EC, cycloSPORINE, Multiple Vitamins-Minerals (ZINC PO), polyethylene glycol powder, ULTICARE SHORT PEN NEEDLES, atorvastatin, Diclofenac Sodium, zinc gluconate, cholecalciferol, bisoprolol, furosemide, losartan, levothyroxine, LEVEMIR FLEXTOUCH, ibuprofen, bisacodyl, glucose blood, ULTICARE SHORT PEN NEEDLES, and HYDROcodone-acetaminophen.  Meds ordered this encounter  Medications  . HYDROcodone-acetaminophen (NORCO) 10-325 MG tablet    Sig: Take 1 tablet by mouth every 6 (six) hours as needed.    Dispense:  90 tablet    Refill:  0    Fill on or after 12/08/15  . Empagliflozin-Metformin HCl (SYNJARDY) 01-999 MG TABS    Sig: Take 1 tablet by mouth 2 (two) times daily.    Dispense:  60 tablet    Refill:  5  . ferrous sulfate 325 (65 FE) MG tablet    Sig: Take 1 tablet (325 mg total) by mouth 2 (two) times daily with a meal. Reported on 11/05/2015    Dispense:  180 tablet    Refill:  1     Follow-up: Return in about 4 months (around 04/08/2016).  Scarlette Calico, MD

## 2015-12-08 NOTE — Patient Instructions (Signed)

## 2015-12-08 NOTE — Progress Notes (Signed)
Pre visit review using our clinic review tool, if applicable. No additional management support is needed unless otherwise documented below in the visit note. 

## 2015-12-17 ENCOUNTER — Other Ambulatory Visit: Payer: Self-pay | Admitting: *Deleted

## 2015-12-17 ENCOUNTER — Encounter: Payer: Self-pay | Admitting: *Deleted

## 2015-12-17 NOTE — Patient Outreach (Addendum)
[facility]) Care Management  12/17/2015  Rachael Schickling October 24, 1955 [id number]   Assessment: Routine home visit  Arrived at patient's one level home. Patient opened the door and identity was verified. Patient did not let care management coordinator in stating that he forgot to call to cancel appointment due to not feeling well and "feels not up to a home visit today". He further states that he is still recovering from pneumonia. Visit ended early since patient requested to be called instead, in order to reschedule home visit in 2 weeks.   Patient reports being seen by primary care provider and had already completed antibiotics (Levaquin) that was ordered. Encouraged patient to follow-up with provider if symptoms does not improve.  EMMI educational materials on diabetes provided and encouraged him to ask questions if any.  Encouraged patient to call [facility] care management coordinator or 24- hour nurse line as needed.  Contact information with patient.   Plan: Will call patient next week - 12/22/15 to reschedule for a home visit per patient's request.  Edwena Felty A. Xochitl Egle, BSN, RN-BC [place] Management Coordinator Cell: [phone]

## 2015-12-18 ENCOUNTER — Telehealth: Payer: Self-pay | Admitting: Internal Medicine

## 2015-12-18 NOTE — Telephone Encounter (Signed)
Patient would like to know if Dr. [name] could fax DMV form over to the [facility].  States Dr. [name] filled it out and was to fax it but he called the [facility] and they do not have the form yet.

## 2015-12-18 NOTE — Telephone Encounter (Signed)
Please see phone note dated 11/30/2015

## 2015-12-18 NOTE — Telephone Encounter (Signed)
Patient brought from back in for completion with Dr. [name].  Patient states Dr. [name] had the form when he left on 3/21.  Janace Hoard can you forword to Dr. [name]?

## 2015-12-22 ENCOUNTER — Other Ambulatory Visit: Payer: Self-pay | Admitting: *Deleted

## 2015-12-22 NOTE — Patient Outreach (Signed)
[name]) Care Management  12/22/2015  [name] 12-07-1955 [id number]   Assessment: Call placed and spoke with patient who reports that he is "doing a lot better- getting over pneumonia" as stated. Informed patient that care management coordinator called to reschedule a routine home visit with him.   Patient denies coughing, mucus production, fever and shortness of breath. His primary care provider saw him for follow-up on 12/08/15 and noted that blood sugar is not well controlled. Dr. [name] (primary care provider) added a diabetic oral medication (Empagliflozin) but patient reports not starting it yet since he had been sick of pneumonia. He states that  he is waiting for medication to be delivered to his home since he just recently put in the prescription. Encouraged patient to start new diabetes medication once available. Patient mentions that blood sugar this morning was not taken since he had an early morning personal appointment. He states that he checked his blood sugar at lunch and was 240. He refused to give blood sugar readings for the previous weeks stating  " it's all listed and you'll  see it when you come".  Patient admits he has not made an appointment to see endocrinologist (Dr. Lissa Merlin) for follow-up. He also mentioned of not having made an appointment for cataract surgery which he is suppose to have.  Patient agreed for a routine home visit next week. He is unable to identify any other needs at this time. Encouraged to call [facility] management coordinator or 24 hour nurse line if necessary. Contact information with patient. .  Plan: Routine home visit rescheduled on 12/31/15.  [name], BSN, RN-BC [place] Management Coordinator Cell: [phone]

## 2015-12-28 ENCOUNTER — Other Ambulatory Visit: Payer: Self-pay | Admitting: Internal Medicine

## 2015-12-28 NOTE — Telephone Encounter (Signed)
Faxed kept gettingg busy signal

## 2015-12-28 NOTE — Telephone Encounter (Signed)
Mailed copy to pt.

## 2015-12-28 NOTE — Telephone Encounter (Signed)
faxed

## 2015-12-28 NOTE — Telephone Encounter (Signed)
This has been completed.

## 2015-12-31 ENCOUNTER — Other Ambulatory Visit: Payer: Self-pay | Admitting: *Deleted

## 2016-01-01 ENCOUNTER — Encounter: Payer: Self-pay | Admitting: *Deleted

## 2016-01-01 NOTE — Patient Outreach (Signed)
[facility]) Care Management  01/01/2016  Jenri Hundt 10/03/1955 [id number]  Assessment: Routine Home Visit  Arrived at patient's one- level house. Patient and a lady were in the house (patient states- his sister).  Patient reports "feeling lousy" but got over pneumonia as stated. Patient has no report of blood sugar reading taken today. No noted documentation of blood sugar monitoring that was done either. Patient stated no plans to take new medications for diabetes (Empagliflozin) as of now until being seen by endocrinologist (Dr. Lissa Merlin). Medication was recently added by Dr. [name] due to uncontrolled blood sugar. Primary care provider (Dr. [name]) is currently managing diabetes since he had refused referral to Dr. Lissa Merlin several times per report. Patient has not seen endocrinologist as of yet. He continues to be non-compliant with diabetic diet and states eating regular food inspite discussion of food guides and [facility] diabetic packet provided to him. He reports taking Slim Fast twice a day and started to work out at [employer] this week.    Patient was requesting to have a new walker to replace his old one. Explained to patient that prescription from provider is needed to obtain a new walker. Care management coordinator offered to assist him to get a prescription from provider but he refused and states he could do it himself since that is a simple thing to do, besides, already having a prescription for a powered wheelchair previously but unable to afford the co-pay. Explained to patient that he will need another prescription specifically for the walker. Patient was insistent and raising his voice stating that care management coordinator could not even help him provide a new walker.  He continued to refuse seeing a nutritionist/ dietitian to assist with food choices as well as refused to attend diabetes classes since it is an additional expense (paying co-pays) as verbalized. Patient  states he has no plan to schedule an appointment for cataract surgery since it is another expense for him.  Patient started raising voice again at care management coordinator when spoken to his sister (lady in the house).   Home visit was ended early since patient does not want to follow advise and interventions. He is not willing to progress through goals and plan of care.   Plan: Will close case due to failure to participate with the program. Will notify primary care provider of case closure.  Anthonyjames Bargar A. Jamie Belger, BSN, RN-BC [place] Management Coordinator Cell: [phone]

## 2016-01-09 ENCOUNTER — Other Ambulatory Visit: Payer: Self-pay | Admitting: Internal Medicine

## 2016-01-11 ENCOUNTER — Other Ambulatory Visit: Payer: Self-pay | Admitting: General Practice

## 2016-01-11 NOTE — Patient Outreach (Signed)
[facility] Care Management received a phone call from the patient expressing concern of his case being closed for community care management. He had also called the 24 hour nurse line with similar concerns.  This Probation officer called the pt this am to support him from a leadership role. Inquiries were made regarding the last home visit in question and he stated he "was told what the care management program was about and the nurse checked on my medicines. I was just getting over pneumonia and I asked to reschedule since I still didn't feel too good. My sister was also here and things got heated in our conversation (with the RN). I didn't want to be kicked out of the program, I just wanted her to know that I wasn't quite back on track from the pneumonia".  This Probation officer questioned the patient about sick days related to diabetes, checking his blood sugars and discussed the signs and symptoms of low and high blood sugars. He stated that he knew this and "wasn't jittery or anything like that and has been checking his sugars sometime". When questioned, he could not recall a blood sugar reading for this Probation officer. He reports "going back to the gym once or twice a week, trying juicing to help lose some weight, and adding dry beans to his blender for extra protein. I've also tried some of those Slimfast drinks". This Probation officer cautioned him on drinking Slimfast as it has a lot of sugar in it and asked him to look for drinks with more protein and less sugar. He verbalized an understanding of this. He denies any falls as this had been a problem in the past. He is waiting for a new rolling walker, the order has been sent to the DME company. He has chosen not to pursue an Transport planner as he can't afford the copayments. He reports having all and taking his medications as prescribed and has an upcoming appointment with his "diabetes doctor" within the next 1-2 weeks.  Patient Experience discussed and contact information offered to the patient  since he had expressed some concerns related to his care and communication.The pt stated "that's okay, we have talked and I would like for someone else to follow up with me and help me manage things better". He reports the desire to continue participation in the program." Telephonic and community home visits offered and the patient states a preference for a RN to follow him telephonically at this time.  A referral will be made for a [place] to follow patient for diabetes disease management.

## 2016-01-13 ENCOUNTER — Telehealth: Payer: Self-pay

## 2016-01-13 NOTE — Telephone Encounter (Signed)
Please inform him there is a form pts usually bring in or have a company fax over this request and we fill it out. We do not write out the scripts here but we can fill out the appr. Form

## 2016-01-13 NOTE — Telephone Encounter (Signed)
Patient needs a RX for diabeatic shoes. Dr. [name] told him he would give him one. He states he will come back and pick it up whenever. Please advise.

## 2016-01-14 ENCOUNTER — Other Ambulatory Visit: Payer: Self-pay

## 2016-01-14 NOTE — Patient Outreach (Addendum)
[facility]) Care Management  01/14/2016  [name] 03/29/1956 [id number]   Referral Date:  01/12/2016 Source:  Community  Issue:  DM and recent PNA  Outreach call #1 to patient.  Patient not reached. No answer following several rings.  RN CM scheduled for next contact call within one week.   Mariann Laster, RN, BSN, [facility], CCM  [employer] Management Coordinator [phone] Direct [phone] Cell [phone] Office [phone] Fax

## 2016-01-14 NOTE — Telephone Encounter (Signed)
Called patient and informed him. Michela Pitcher he would get it.

## 2016-01-19 ENCOUNTER — Telehealth: Payer: Self-pay | Admitting: Pulmonary Disease

## 2016-01-19 ENCOUNTER — Telehealth: Payer: Self-pay | Admitting: Internal Medicine

## 2016-01-19 ENCOUNTER — Other Ambulatory Visit: Payer: Self-pay

## 2016-01-19 VITALS — BP 140/80 | Ht 72.0 in | Wt >= 6400 oz

## 2016-01-19 DIAGNOSIS — M545 Low back pain: Secondary | ICD-10-CM

## 2016-01-19 DIAGNOSIS — M1610 Unilateral primary osteoarthritis, unspecified hip: Secondary | ICD-10-CM

## 2016-01-19 DIAGNOSIS — G4733 Obstructive sleep apnea (adult) (pediatric): Secondary | ICD-10-CM

## 2016-01-19 DIAGNOSIS — L97509 Non-pressure chronic ulcer of other part of unspecified foot with unspecified severity: Principal | ICD-10-CM

## 2016-01-19 DIAGNOSIS — E1110 Type 2 diabetes mellitus with ketoacidosis without coma: Secondary | ICD-10-CM

## 2016-01-19 DIAGNOSIS — E11621 Type 2 diabetes mellitus with foot ulcer: Secondary | ICD-10-CM

## 2016-01-19 NOTE — Patient Outreach (Signed)
[facility]) Care Management  01/19/2016  [name] 1956/07/30 [id number]   Referral Date: 01/12/2016 Source: H/[facility] Community Closed case 01/01/16 due to failing to progress.  Patient contacted [facility] and requested services be continued. Issue: DM and recent PNA  Outreach call #2 to patient. Patient reached.  Screening and Initial Assessment completed.    Subjective: Patient states he is eating a healthy diet and is going to the gym daily.  States his health started worsening following a hernia issue when he was not as active.  Patient unable to state A1C readings only that the result was "bad."  States he does not know what an Endocrinologist is but understands that Dr. Lissa Merlin is a Diabetes MD.     Providers: Primary MD: Dr. [name]  -  last appt: 12/28/2015 /  next appt: 3 months (June) Endocrinologist:  Dr. Lissa Merlin  - Patient admits to non-adherence to appt's with Dr. Lissa Merlin due to an  issue running late for an appt and had issues with staff within the office which patient states he  reported to MD office via witting a letter and also notified Dr. [name].  States Dr. [name] prefers for patient to continue to see Dr. Lissa Merlin.  Patient states he will call and schedule appt.   Optometrist: yes Retina Specialist: Cataract Surgeon:  appt pending; patient has not made due to $45.00 co-pay and has just seen two other eye MDs with $45.00 co-pays each.  Pulmonologist:  HH: None Insurance:  [employer]   Social: Patient lives in his home alone.  Patient is on SSD.  Plans a vacation to [place] 3rd week of June; returning home after July 4th.  Patient has not seen his family in 1 years.  Mobility: Ambulates with walker (up to 450 lb size).  In process of getting a new walker.  Dr. [name] has sent order to [facility].  Falls: None  Pain: None  Depression: None  Transportation:  Patient has purchased a car and is driving self to appt's and gym.  States this has helped him get out  of the house and not eat so much.   Caregiver: Self - or [name].  Advance Directive: Patient states he does not ever want to be resuscitated.  None but confirms he would like to complete and agreed to SW Referral.  Resources: SSD over 1,000.00 per month.  Patient refuses option of resource for food pantry to free up cash to cover current medical cost.  Consent:  Yes  DME: walker (450 lb size), CPap (broken), CBG meter and testing supplies (new meter provided by Dr. [name] office 11/2015; [employer]), eyeglasses, scales (only go up to 420 lbs and patient unable to use due to weight currently over that limit.   THN conditions:  DM (1996) Admissions: 0 ER visits: 0 A1C:  9.8 12/08/15.  States A1C to be checked again in 3 months.  Weight: 453 last known by patient. scales (only go up to 420 lbs and patient unable to use due to weight currently over that limit.   States unable to weight at the gym due to weight max of 400.  States his current scales cost over $100 to get from [facility] and unable to afford another set.   BP:  140/80.  States no BP cuff in the home and would need extra large cuff and is not affordable.  BS 89-109 morning average BS order 3 times a day; only checking 2 times  a day (before breakfast and before dinner).  Patient states reason for not checking mid day is because he goes to the gym and takes a nap after getting back home and forgets to check.  Patient states he is unable to recall the last time he received any DM education other than just going to his MD appt's.  Exercise:  Gym daily.  Medication:  Insulin morning dose only.  Eye exam:  Yes.  States cataract surgery pending but having issues with $45.00 co-pay's seeing so many different eye MDs.  States he has still not see the eye surgeon to remove the cataracts.  States another issue with adherence associated to one MD located in [facility] and this was inconvenient for patient.   Breakfast: Coffee and 2  apples Lunch:  Smoothie - carrot, kale, cinnamon, apple, kidney beans, almonds, pineapple, water Dinner: (4:30-5pm) chicken (fried), vegetables in olive oil, rice. Bedtime Snack: orange   (states he gets hungry at bedtime).   Patient does not like eggs and can not drink milk.   PNA Patient reports his pneumonia has resolved.    Medications:  Patient taking less than 15 medications  Co-pay cost issues: no Flu Vaccine:  none Pneumonia Vaccine:  PCV13   None  Pneumovax 03/24/14   Objective:   Encounter Medications:  Outpatient Encounter Prescriptions as of 01/19/2016  Medication Sig Note  . aspirin EC 325 MG tablet Take 325 mg by mouth every evening.  01/01/2016: Patient ran out of medication- will need to pick up.  Marland Kitchen atorvastatin (LIPITOR) 40 MG tablet Take 1 tablet (40 mg total) by mouth daily.   . bisacodyl (DULCOLAX) 5 MG EC tablet Take 5 mg by mouth daily as needed for moderate constipation.   . bisoprolol (ZEBETA) 10 MG tablet TAKE 1 TABLET (10 MG TOTAL) BY MOUTH EVERY EVENING.   . cholecalciferol (VITAMIN D) 1000 UNITS tablet Take 1,000 Units by mouth daily.   . cycloSPORINE (RESTASIS) 0.05 % ophthalmic emulsion Place 1 drop into both eyes 2 (two) times daily.   . Empagliflozin-Metformin HCl (SYNJARDY) 01-999 MG TABS Take 1 tablet by mouth 2 (two) times daily. 12/22/2015: Not started per patient since he was sick with pneumonia as reported  . ferrous sulfate 325 (65 FE) MG tablet Take 1 tablet (325 mg total) by mouth 2 (two) times daily with a meal. Reported on 11/05/2015   . furosemide (LASIX) 40 MG tablet TAKE 1 TABLET (40 MG TOTAL) BY MOUTH 2 TIMES DAILY. 12/22/2015: Taking it once a day per patient  . glucose blood (ONETOUCH VERIO) test strip Use to check blood sugar up to three times a day DX E10.29   . ibuprofen (ADVIL,MOTRIN) 200 MG tablet Take 200 mg by mouth every 6 (six) hours as needed for mild pain.   Marland Kitchen LEVEMIR FLEXTOUCH 100 UNIT/ML Pen INJECT 2.4 MLS (240 UNITS TOTAL) INTO THE  SKIN EVERY MORNING.   Marland Kitchen levothyroxine (SYNTHROID, LEVOTHROID) 50 MCG tablet TAKE 1 TABLET BY MOUTH DAILY BEFORE BREAKFAST.   Marland Kitchen losartan (COZAAR) 25 MG tablet TAKE 1 TABLET (25 MG TOTAL) BY MOUTH DAILY.   Marland Kitchen ULTICARE SHORT PEN NEEDLES 31G X 8 MM MISC Dx E10.9 Use one pen needle once daily with insulin   . ULTICARE SHORT PEN NEEDLES 31G X 8 MM MISC Use with insulin pen daily E11.9   . Diclofenac Sodium (PENNSAID) 1.5 % SOLN Place 0.2 mLs onto the skin 3 (three) times daily. (Patient not taking: Reported on 12/22/2015)   .  HYDROcodone-acetaminophen (NORCO) 10-325 MG tablet Take 1 tablet by mouth every 6 (six) hours as needed. (Patient not taking: Reported on 01/19/2016)   . Multiple Vitamins-Minerals (ZINC PO) Take by mouth. Reported on 01/19/2016 11/05/2015: Takes only when he can afford it  . polyethylene glycol powder (GLYCOLAX/MIRALAX) powder Take 17 g by mouth daily as needed for mild constipation. (Patient not taking: Reported on 01/19/2016)   . vitamin C (ASCORBIC ACID) 500 MG tablet Take 1,000 mg by mouth 2 (two) times daily. Reported on 01/19/2016 01/01/2016: Patient ran out of medication- will need to pick up supply.  . vitamin E 200 UNIT capsule Take 400 Units by mouth every morning. Reported on 01/19/2016   . zinc gluconate 50 MG tablet Take 50 mg by mouth daily. Reported on 01/19/2016 11/05/2015: Too expensive- can not afford   No facility-administered encounter medications on file as of 01/19/2016.    Functional Status:  In your present state of health, do you have any difficulty performing the following activities: 01/19/2016 11/06/2015  Hearing? N -  Vision? Y N  Difficulty concentrating or making decisions? N -  Walking or climbing stairs? Y -  Dressing or bathing? N -  Doing errands, shopping? N -  Preparing Food and eating ? N -  Using the Toilet? N -  In the past six months, have you accidently leaked urine? N -  Do you have problems with loss of bowel control? N -  Managing your Medications?  N -  Managing your Finances? N -  Housekeeping or managing your Housekeeping? N -    Fall/Depression Screening: PHQ 2/9 Scores 01/19/2016 01/01/2016 01/01/2016 11/05/2015 09/24/2015 02/05/2015  PHQ - 2 Score 0 1 0 0 0 0   Fall Risk  01/19/2016 01/01/2016 11/06/2015 11/05/2015 10/09/2015  Falls in the past year? No Yes - - Yes  Number falls in past yr: - 2 or more - - 2 or more  Injury with Fall? No No - - No  Risk Factor Category  High Fall Risk High Fall Risk - - High Fall Risk  Risk for fall due to : Impaired balance/gait History of fall(s);Impaired balance/gait;Impaired mobility History of fall(s);Impaired balance/gait;Impaired mobility Other (Comment) History of fall(s);Impaired balance/gait;Impaired mobility  Risk for fall due to (comments): walker - - - -  Follow up Falls prevention discussed Education provided;Falls prevention discussed - Falls prevention discussed;Education provided Education provided;Falls prevention discussed    Assessment:  Patient has active self management plan and has reduced A1C reading by a fraction.   Patient lacks knowledge of his condition, lab values and diet selection.  Co-pay cost are a barrier to timely care.  Patient schedules as he is able to afford.  Patient would benefit from accountability partner for Disease management and additional education on risk and complications of DM to improve his compliance and self management regime.    Plan:  Referral date:  01/12/16 Screening and Initial Assessment:  01/19/16 Telephonic RN CM Referral 01/19/15 Program:  DM  01/19/15 Acuity Level:  3 -01/19/15  DM -RN CM provided education on A1C and provided results back as far as 2015.   Results for [name], [name] (MRN [id number]) as of 01/19/2016 13:12  Ref. Range 04/30/2014 07:36 08/11/2014 11:04 12/31/2014 08:41 06/05/2015 09:29 12/08/2015 11:40  Hemoglobin A1C Latest Ref Range: 4.6-6.5 % 8.5 (H) 10.1 (H) 10.6 (H) 10.2 (H) 9.8 (H)   RN CM advised in normal reading range:  4.6 -  6.5 RN CM had patient to write down  these readings.  Advised going forward that RN CM expects patient to know number reading and not just "Bad" as this is a report card and patient needs to know all levels of progress even if only 1 point.   RN CM advised in complications of uncontrolled DM:  Affecting eyesight, circulation, kidneys, heart and risk of stroke.  RN CM praised patient for current self management regime:  Diet, exercise and encouraged to stay on current path of active management.  Patient praised for progressive A1C decline over the past one year.  DM Diet: RN CM reviewed food selections and provided education on high protein foods to add and balance out carbohydrates.   Patient agreed to add peanut better to apple for Breakfast and low fat cheese to orange at night.  RN CM discussed vacation and need to psychologically prepare for meal times to avoid making bad choices.  RN CM will continue to introduce education and self management tips as appropriate.  Endocrinologist Appt:  Dr. Lissa Merlin RN CM provided patient education on what is an Endocrinologist? RN CM advised that staff should not be an barrier / obstacle in patient seeing Dr. Lissa Merlin for DM care.  Patient confirmed he would schedule appt to see Dr. Lissa Merlin.   RN CM advised to follow up closely with Dr. Lissa Merlin until patient elects to seek specialty care.  Advised while this is patient's right to refuse recommendations; RN CM would not encourage this and that patient's specialized DM condition deserves specialty services to achieve best outcomes and avoid health risk and complications of DM.  RN CM will continue to monitor progress and assist as needed.   Pulmonologist: CPap, (broken); OSA follow-up Patient stated he would contact MD to schedule appt.    Cataract Surgeon appt RN CM will continue to follow progress and assist as needed.   DME:  RN CM will review [employer] DME benefit to assess if patient is eligible for scales up to  500 lbs and extra large BP monitor.  RN CM will follow-up on [place] order progress.  Advance Directives:  RN CM provided education on importance of having an Forensic scientist.  [facility] Social Work Referral sent 01/19/16  RN CM advised in next [facility] scheduled contact call within next 30 days for telephonic monthly assessment and care coordination services as needed. RN CM advised to please notify MD of any changes in condition prior to scheduled appt's.   RN CM provided contact name and # [phone] or main office # [phone] and 24-hour nurse line # [phone].  RN CM confirmed patient is aware of 911 services for urgent emergency needs.  RN CM sent successful outreach letter and  [facility] Acute Care Introductory package. RN CM notified [place] Management Assistant: agreed to services/case opened. RN CM sent Physician Enrollment/Barriers Letter and Initial Assessment to Primary MD    Mariann Laster, RN, BSN, [facility] [place] Management Care Management Coordinator [phone] Direct [phone] Cell [phone] Office [phone] Fax

## 2016-01-19 NOTE — Telephone Encounter (Signed)
Attempted to call pt. Unable to reach him and no VM was available.

## 2016-01-19 NOTE — Telephone Encounter (Signed)
Pt called in and said that he needs a new cpap mechine, it is completely broke and he can not get into [facility] til late July.  He also needs a Script [facility]  His is about to break  Best number [phone]

## 2016-01-19 NOTE — Telephone Encounter (Signed)
Order for walker done. There is a note from pulmonology where he has called and made the request for his CPAP machine. Will route the walker to [facility]

## 2016-01-20 ENCOUNTER — Encounter: Payer: Self-pay | Admitting: *Deleted

## 2016-01-20 NOTE — Telephone Encounter (Signed)
Spoke with pt and given appt with Tammy Parrett. Nothing further needed.

## 2016-01-20 NOTE — Telephone Encounter (Signed)
Pt can see NP  No need to wait until July It has not been 3 yrs since last ov so when he calls back make him appt with NP  I tried calling but NA and no option to leave msg

## 2016-01-22 ENCOUNTER — Encounter: Payer: Self-pay | Admitting: Adult Health

## 2016-01-22 ENCOUNTER — Ambulatory Visit (INDEPENDENT_AMBULATORY_CARE_PROVIDER_SITE_OTHER): Payer: Commercial Managed Care - HMO | Admitting: Adult Health

## 2016-01-22 ENCOUNTER — Telehealth: Payer: Self-pay | Admitting: *Deleted

## 2016-01-22 VITALS — BP 148/80 | HR 62 | Temp 97.9°F | Ht 72.0 in | Wt >= 6400 oz

## 2016-01-22 DIAGNOSIS — M159 Polyosteoarthritis, unspecified: Secondary | ICD-10-CM

## 2016-01-22 DIAGNOSIS — E669 Obesity, unspecified: Secondary | ICD-10-CM | POA: Diagnosis not present

## 2016-01-22 DIAGNOSIS — E0821 Diabetes mellitus due to underlying condition with diabetic nephropathy: Secondary | ICD-10-CM

## 2016-01-22 DIAGNOSIS — E1142 Type 2 diabetes mellitus with diabetic polyneuropathy: Secondary | ICD-10-CM

## 2016-01-22 DIAGNOSIS — M150 Primary generalized (osteo)arthritis: Secondary | ICD-10-CM

## 2016-01-22 DIAGNOSIS — G4733 Obstructive sleep apnea (adult) (pediatric): Secondary | ICD-10-CM

## 2016-01-22 DIAGNOSIS — M545 Low back pain: Secondary | ICD-10-CM

## 2016-01-22 MED ORDER — HYDROCODONE-ACETAMINOPHEN 10-325 MG PO TABS: 1.0000 | ORAL_TABLET | Freq: Four times a day (QID) | ORAL | Status: DC | PRN

## 2016-01-22 NOTE — Progress Notes (Signed)
Subjective:    Patient ID: [name], male    DOB: 1955-12-08, 60 y.o.   MRN: [id number]  HPI 60 yo male with severe OSA  Former Dr. Gwenette Greet pt   TEST  NPSG 2004 : AHI 86/hr    01/22/2016 Follow up : OSA  Pt returns for a follow up for sleep apnea.  Last seen in office in 2015.   Uses CPAP on average 7-8 hours nightly but machine broke 2 weeks ago. Unable to get fixed.  Would like to discuss a new machine and mask. His is very old. Feels tired since not on CPAP . Not able to sleep as good.  Denies chest pain, orthopnea, edema or fever.    Past Medical History  Diagnosis Date  . Diabetes mellitus     Has Hx of diabetic foot ulcer & peripheral neuropathy  . Hyperlipidemia   . HTN (hypertension)   . Morbid obesity ([place])   . History of PFTs 05/2009    Mild Obstructive defect  . OSA on CPAP   . Anemia   . Colon cancer ([place]) 2007    s/p sigmoid colectomy  . CHF (congestive heart failure) ([facility])     Presumed diastolic. Echo (06/07) w.EF 45%, severe posterior HK, mild LV hypertrophy, No further work-up of abnormal echo was done.   Current Outpatient Prescriptions on File Prior to Visit  Medication Sig Dispense Refill  . aspirin EC 325 MG tablet Take 325 mg by mouth every evening.     Marland Kitchen atorvastatin (LIPITOR) 40 MG tablet Take 1 tablet (40 mg total) by mouth daily. 90 tablet 3  . bisacodyl (DULCOLAX) 5 MG EC tablet Take 5 mg by mouth daily as needed for moderate constipation.    . bisoprolol (ZEBETA) 10 MG tablet TAKE 1 TABLET (10 MG TOTAL) BY MOUTH EVERY EVENING. 90 tablet 3  . cholecalciferol (VITAMIN D) 1000 UNITS tablet Take 1,000 Units by mouth daily.    . cycloSPORINE (RESTASIS) 0.05 % ophthalmic emulsion Place 1 drop into both eyes 2 (two) times daily. 0.4 mL 5  . Diclofenac Sodium (PENNSAID) 1.5 % SOLN Place 0.2 mLs onto the skin 3 (three) times daily. 150 mL 11  . Empagliflozin-Metformin HCl (SYNJARDY) 01-999 MG TABS Take 1 tablet by mouth 2 (two) times daily. 60  tablet 5  . ferrous sulfate 325 (65 FE) MG tablet Take 1 tablet (325 mg total) by mouth 2 (two) times daily with a meal. Reported on 11/05/2015 180 tablet 1  . furosemide (LASIX) 40 MG tablet TAKE 1 TABLET (40 MG TOTAL) BY MOUTH 2 TIMES DAILY. 180 tablet 3  . glucose blood (ONETOUCH VERIO) test strip Use to check blood sugar up to three times a day DX E10.29 300 each 1  . ibuprofen (ADVIL,MOTRIN) 200 MG tablet Take 200 mg by mouth every 6 (six) hours as needed for mild pain.    Marland Kitchen levothyroxine (SYNTHROID, LEVOTHROID) 50 MCG tablet TAKE 1 TABLET BY MOUTH DAILY BEFORE BREAKFAST. 90 tablet 2  . losartan (COZAAR) 25 MG tablet TAKE 1 TABLET (25 MG TOTAL) BY MOUTH DAILY. 90 tablet 3  . Multiple Vitamins-Minerals (ZINC PO) Take by mouth. Reported on 01/19/2016    . polyethylene glycol powder (GLYCOLAX/MIRALAX) powder Take 17 g by mouth daily as needed for mild constipation. 255 g 11  . ULTICARE SHORT PEN NEEDLES 31G X 8 MM MISC Dx E10.9 Use one pen needle once daily with insulin 100 each 11  . ULTICARE SHORT PEN NEEDLES  31G X 8 MM MISC Use with insulin pen daily E11.9 100 each 3  . vitamin C (ASCORBIC ACID) 500 MG tablet Take 1,000 mg by mouth 2 (two) times daily. Reported on 01/19/2016    . vitamin E 200 UNIT capsule Take 400 Units by mouth every morning. Reported on 01/19/2016    . zinc gluconate 50 MG tablet Take 50 mg by mouth daily. Reported on 01/19/2016    . LEVEMIR FLEXTOUCH 100 UNIT/ML Pen INJECT 2.4 MLS (240 UNITS TOTAL) INTO THE SKIN EVERY MORNING. 75 mL 3   No current facility-administered medications on file prior to visit.      Review of Systems Constitutional:   No  weight loss, night sweats,  Fevers, chills, fatigue, or  lassitude.  HEENT:   No headaches,  Difficulty swallowing,  Tooth/dental problems, or  Sore throat,                No sneezing, itching, ear ache, nasal congestion, post nasal drip,   CV:  No chest pain,  Orthopnea, PND, swelling in lower extremities, anasarca, dizziness,  palpitations, syncope.   GI  No heartburn, indigestion, abdominal pain, nausea, vomiting, diarrhea, change in bowel habits, loss of appetite, bloody stools.   Resp: No shortness of breath with exertion or at rest.  No excess mucus, no productive cough,  No non-productive cough,  No coughing up of blood.  No change in color of mucus.  No wheezing.  No chest wall deformity  Skin: no rash or lesions.  GU: no dysuria, change in color of urine, no urgency or frequency.  No flank pain, no hematuria   MS:  No joint pain or swelling.  No decreased range of motion.  No back pain.  Psych:  No change in mood or affect. No depression or anxiety.  No memory loss.         Objective:   Physical Exam Filed Vitals:   01/22/16 1020  BP: 148/80  Pulse: 62  Temp: 97.9 F (36.6 C)  TempSrc: Oral  Height: 6' (1.829 m)  Weight: 472 lb (214.098 kg)  SpO2: 98%   Body mass index is 64 kg/(m^2).   GEN: A/Ox3; pleasant , NAD, obese   HEENT:  [place]/AT,  EACs-clear, TMs-wnl, NOSE-clear, THROAT-clear, no lesions, no postnasal drip or exudate noted. Class 2-3 MP airway   NECK:  Supple w/ fair ROM; no JVD; normal carotid impulses w/o bruits; no thyromegaly or nodules palpated; no lymphadenopathy.  RESP  Clear  P & A; w/o, wheezes/ rales/ or rhonchi.no accessory muscle use, no dullness to percussion  CARD:  RRR, no m/r/g  , 1+  peripheral edema, pulses intact, no cyanosis or clubbing.  GI:   Soft & nt; nml bowel sounds; no organomegaly or masses detected.  Musco: Warm bil, no deformities or joint swelling noted.   Neuro: alert, no focal deficits noted.    Skin: Warm, no lesions or rashes  Novalee Horsfall NP-C  [facility]          Assessment & Plan:

## 2016-01-22 NOTE — Progress Notes (Signed)
Reviewed, and agree with assessment/plan.  Chesley Mires, MD [facility] Pulmonary/Critical Care 01/22/2016, 12:36 PM Pager:  [phone]

## 2016-01-22 NOTE — Telephone Encounter (Signed)
Notified pt rx ready for pick-up.../lmb 

## 2016-01-22 NOTE — Addendum Note (Signed)
Addended by: Osa Craver on: 01/22/2016 11:12 AM   Modules accepted: Orders

## 2016-01-22 NOTE — Patient Instructions (Signed)
Order to [facility] for new CPAP machine  Restart CPAP At bedtime   Download in 6 weeks after new machine started.  Goal is to wear for 6hr each night  Work on weight loss.  Do not drive if sleepy.  Follow up Dr. Halford Chessman  In 1 year and As needed

## 2016-01-22 NOTE — Telephone Encounter (Signed)
OK to fill this prescription with additional refills x0 Sch OV w/PCP Thank you!

## 2016-01-22 NOTE — Assessment & Plan Note (Signed)
Wt loss discussed 

## 2016-01-22 NOTE — Assessment & Plan Note (Signed)
Severe OSA -CPAP machine is broken  Referral for new machine sent ASAP   Plan  Order to [facility] for new CPAP machine  Restart CPAP At bedtime   Download in 6 weeks after new machine started.  Goal is to wear for 6hr each night  Work on weight loss.  Do not drive if sleepy.  Follow up Dr. Halford Chessman  In 1 year and As needed

## 2016-01-22 NOTE — Telephone Encounter (Signed)
Left msg on triage needing refill on his Hydrocodone. MD out of office pls advise on refill...[name]

## 2016-01-26 ENCOUNTER — Telehealth: Payer: Self-pay | Admitting: Internal Medicine

## 2016-01-26 NOTE — Telephone Encounter (Signed)
Just got the confirmation from M. Stenson the walker was received. She was waiting on MD signoff. Made aware they will contact pt regarding walker

## 2016-01-26 NOTE — Telephone Encounter (Signed)
Routing to gabby---do you know anything about this?

## 2016-01-26 NOTE — Telephone Encounter (Signed)
Pt request to speak to the assistant concern about diabetic shoes and the walker, pt stated someone brought in the from for the diabetic shoes already. Please call him back

## 2016-01-26 NOTE — Telephone Encounter (Signed)
There was a staff msg sent to Darlina Guys about the walker it was ordered on 5/2. I have sent a staff msg today to check on the status of this.[facility] will be contacting him regarding the walker. He called about the diabetic shoes a while ago and I told Edwena Blow he needed to bring in a form that we cannot order without one and I have not seen one dropped off or faxed. Does he know who he gave it to or if he had it faxed he can verify with the company what fax number he gave them and have it refaxed.

## 2016-02-03 ENCOUNTER — Other Ambulatory Visit: Payer: Self-pay

## 2016-02-03 NOTE — Patient Outreach (Signed)
[facility]) Care Management  02/03/2016  [name] [birth year] [id number]   Telephonic Monthly Assessment   Referral Date: 01/12/2016 Source: H/o Community Closed case 01/01/16 due to failing to progress. Patient contacted [facility] and requested services be continued. Issue: DM and recent PNA  Subjective: Patient states he has completed MD follow-up appt to assess for CPap machine replacement.  Order sent to [place].  States his weight went up and "I guess I am not working out enough at the gym."  States walker has been ordered but he has not picked up yet.   Providers: Primary MD: [name] - last appt: 12/28/2015 / next appt: 3 months (June) Endocrinologist: [name] - Patient admits to non-adherence to appt's with [name] due to an issue running late for an appt and had issues with staff within the office which patient states he reported to MD office via witting a letter and also notified [name]. States [name] prefers for patient to continue to see [name]. Patient states he will call and schedule appt.  Optometrist: yes Retina Specialist: Cataract Surgeon: appt pending; patient has not made due to $45.00 co-pay and has just seen two other eye MDs with $45.00 co-pays each.  Pulmonologist: 01/22/2016 HH: None Insurance: [employer]   Social: Patient lives in his home alone. Patient is on SSD. Plans a vacation to [place] 3rd week of June; returning home after July 4th. Patient has not seen his family in 8 years.  Mobility: Ambulates with walker (up to 450 lb size). In process of getting a new walker. [name] has sent order to [facility].  Falls: None  Pain: None  Depression: None  Transportation: Patient has purchased a car and is driving self to appt's and gym. States this has helped him get out of the house and not eat so much.  Caregiver: Self - or [name].  Advance Directive: Patient states he does  not ever want to be resuscitated. None but confirms he would like to complete and agreed to SW Referral.  Resources: SSD over 1,000.00 per month. Patient refuses option of resource for food pantry to free up cash to cover current medical cost.  Consent: Yes  DME: walker (450 lb size), CPap (new order obtained 01/22/16), CBG meter and testing supplies (new meter provided by [name] office 11/2015; [employer]), eyeglasses, scales (420 lbs max)  [facility] conditions: DM (1996) Admissions: 0 ER visits: 0 Patient states he is eating a healthy diet and is going to the gym daily. H/o health started worsening following a hernia issue when he was not as active.   A1C: 9.8 12/08/15. Checked every 3 months.  Next lab due 03/2016.  Weight: 472 on last MD visit.  Patient's scales (only go up to 420 lbs).  States unable to weight at the gym due to weight max of 400. States his current scales cost over $100 to get from [facility] and unable to afford another set.  BP: 140/80. States no BP cuff in the home and would need extra large cuff and is not affordable.  BS 89-109 morning average BS order 3 times a day and only checks 2 -3 times when his schedule allows.  Exercise: Gym daily.  Medication: Insulin morning dose only.  Eye exam: Yes. Cataract surgery pending but having issues with $45.00 co-pay's seeing so many different eye MDs. States he has still not see the eye surgeon to remove the cataracts.  Referral was sent to MD in the [facility] area and patient states that is too far for him to travel.   Diet:  Diabetic Diet, low salt.  Patient does not like eggs and can not drink milk.  Patient had questions regarding smoothies and if frozen fruit has less nutrients over fresh. Foot Care:  No Podiatrist needed; no issues.  Confirms Primary MD checks feet.    Medications:  Patient taking less than 15 medications  Co-pay cost issues: no Flu Vaccine: none Pneumonia Vaccine: PCV13 None Pneumovax  03/24/14  Objective:   Encounter Medications:  Outpatient Encounter Prescriptions as of 02/03/2016  Medication Sig Note  . aspirin EC 325 MG tablet Take 325 mg by mouth every evening.  01/01/2016: Patient ran out of medication- will need to pick up.  Marland Kitchen atorvastatin (LIPITOR) 40 MG tablet Take 1 tablet (40 mg total) by mouth daily.   . bisacodyl (DULCOLAX) 5 MG EC tablet Take 5 mg by mouth daily as needed for moderate constipation.   . bisoprolol (ZEBETA) 10 MG tablet TAKE 1 TABLET (10 MG TOTAL) BY MOUTH EVERY EVENING.   . cholecalciferol (VITAMIN D) 1000 UNITS tablet Take 1,000 Units by mouth daily.   . cycloSPORINE (RESTASIS) 0.05 % ophthalmic emulsion Place 1 drop into both eyes 2 (two) times daily.   . Diclofenac Sodium (PENNSAID) 1.5 % SOLN Place 0.2 mLs onto the skin 3 (three) times daily.   . Empagliflozin-Metformin HCl (SYNJARDY) 01-999 MG TABS Take 1 tablet by mouth 2 (two) times daily. 12/22/2015: Not started per patient since he was sick with pneumonia as reported  . ferrous sulfate 325 (65 FE) MG tablet Take 1 tablet (325 mg total) by mouth 2 (two) times daily with a meal. Reported on 11/05/2015   . furosemide (LASIX) 40 MG tablet TAKE 1 TABLET (40 MG TOTAL) BY MOUTH 2 TIMES DAILY. 12/22/2015: Taking it once a day per patient  . glucose blood (ONETOUCH VERIO) test strip Use to check blood sugar up to three times a day DX E10.29   . HYDROcodone-acetaminophen (NORCO) 10-325 MG tablet Take 1 tablet by mouth every 6 (six) hours as needed.   Marland Kitchen ibuprofen (ADVIL,MOTRIN) 200 MG tablet Take 200 mg by mouth every 6 (six) hours as needed for mild pain.   Marland Kitchen LEVEMIR FLEXTOUCH 100 UNIT/ML Pen INJECT 2.4 MLS (240 UNITS TOTAL) INTO THE SKIN EVERY MORNING.   Marland Kitchen levothyroxine (SYNTHROID, LEVOTHROID) 50 MCG tablet TAKE 1 TABLET BY MOUTH DAILY BEFORE BREAKFAST.   Marland Kitchen losartan (COZAAR) 25 MG tablet TAKE 1 TABLET (25 MG TOTAL) BY MOUTH DAILY.   . Multiple Vitamins-Minerals (ZINC PO) Take by mouth. Reported on  01/19/2016 11/05/2015: Takes only when he can afford it  . polyethylene glycol powder (GLYCOLAX/MIRALAX) powder Take 17 g by mouth daily as needed for mild constipation.   Marland Kitchen ULTICARE SHORT PEN NEEDLES 31G X 8 MM MISC Dx E10.9 Use one pen needle once daily with insulin   . ULTICARE SHORT PEN NEEDLES 31G X 8 MM MISC Use with insulin pen daily E11.9   . vitamin C (ASCORBIC ACID) 500 MG tablet Take 1,000 mg by mouth 2 (two) times daily. Reported on 01/19/2016 01/01/2016: Patient ran out of medication- will need to pick up supply.  . vitamin E 200 UNIT capsule Take 400 Units by mouth every morning. Reported on 01/19/2016   . zinc gluconate 50 MG tablet Take 50 mg by mouth daily. Reported on 01/19/2016 11/05/2015: Too expensive- can not afford  No facility-administered encounter medications on file as of 02/03/2016.    Functional Status:  In your present state of health, do you have any difficulty performing the following activities: 01/19/2016 11/06/2015  Hearing? N -  Vision? Y N  Difficulty concentrating or making decisions? N -  Walking or climbing stairs? Y -  Dressing or bathing? N -  Doing errands, shopping? N -  Preparing Food and eating ? N -  Using the Toilet? N -  In the past six months, have you accidently leaked urine? N -  Do you have problems with loss of bowel control? N -  Managing your Medications? N -  Managing your Finances? N -  Housekeeping or managing your Housekeeping? N -    Fall/Depression Screening: PHQ 2/9 Scores 02/03/2016 01/19/2016 01/01/2016 01/01/2016 11/05/2015 09/24/2015 02/05/2015  PHQ - 2 Score 0 0 1 0 0 0 0   Fall Risk  02/03/2016 01/19/2016 01/01/2016 11/06/2015 11/05/2015  Falls in the past year? No No Yes - -  Number falls in past yr: - - 2 or more - -  Injury with Fall? No No No - -  Risk Factor Category  High Fall Risk High Fall Risk High Fall Risk - -  Risk for fall due to : Impaired balance/gait Impaired balance/gait History of fall(s);Impaired balance/gait;Impaired  mobility History of fall(s);Impaired balance/gait;Impaired mobility Other (Comment)  Risk for fall due to (comments): walker, neuropathy walker - - -  Follow up Falls prevention discussed Falls prevention discussed Education provided;Falls prevention discussed - Falls prevention discussed;Education provided   Assessment:  Patient has active self management plan and has reduced A1C reading by a fraction.  Patient would benefit from education on Diabetes, lab values and diet selection.   Patient would benefit from accountability partner for Disease management and additional education on risk and complications of DM to improve his compliance and self management regime.  Co-pay cost are a barrier to timely care. Patient schedules as he is able to afford.  Plan:  Referral date: 01/12/16 Screening and Initial Assessment: 01/19/16 Telephonic RN CM Referral 01/19/15 Program: DM 01/19/15 Acuity Level: 3 -01/19/15  DM: RN CM provided education on frozen fruits for smoothies are an excellent choice; fruits are flashed frozen and have more nutrients than some fresh grocery store fruits.  RN CM encouraged patient to check feet every morning and night and when taking on / putting on shoes.  RN CM encouraged to take precaution with foot care during summer months during a time that patient is at risk to be without shoes as much.  RN CM mailed Emmi educational 02/03/16 -Counting Carbohydrates -Diabetes - Eye Care -Diabetes - Preventing Heart Attack and Stroke -Diabetes:  Why Get Your [id number] Checked -Metabolic Syndrome:  Am I At Risk? -Diabetic Neuropathy Patient to schedule appt to see Endocrinologist, [name]  DME RN CM will follow-up with [place] on status of new order for CPap replacement 01/22/16.  RN CM will follow-up with [place] on status of order for walker.  RN CM will review [employer] DME benefit to assess if patient is eligible for scales up to 500 lbs and extra large BP  monitor.  Cataract Surgeon appt RN CM will continue to follow progress and assist as needed. Patient plans to schedule surgery after returning from a trip on 03/22/16. Patient to call RN CM back and provide update on name of MD who made referral to [facility] MD for eye surgery.  RN CM  will contact MD and request new referral to MD within the [facility] area (per patient request).    Advance Directives:  RN CM provided education on importance of having an Advance Directive on 01/19/16.  [facility] Social Work Referral sent 01/19/16 -(SW Update 01/20/16: Please encourage patient to have a discussion with his primary care physician about completing a MOST form during the next office visit. Patient may also need to complete an out-of-facility DNR with physician. CSW will mail an Advanced Directives packet to patient's home.)  RN CM advised in next [facility] scheduled contact call within next 30 days for telephonic monthly assessment and care coordination services as needed. RN CM advised to please notify MD of any changes in condition prior to scheduled appt's.  RN CM provided contact name and # [phone] or main office # [phone] and 24-hour nurse line # [phone].  RN CM confirmed patient is aware of 911 services for urgent emergency needs.  Mariann Laster, RN, BSN, [facility], CCM  [employer] Management Coordinator [phone] Direct [phone] Cell [phone] Office [phone] Fax

## 2016-02-05 ENCOUNTER — Telehealth: Payer: Self-pay | Admitting: Pulmonary Disease

## 2016-02-08 NOTE — Telephone Encounter (Signed)
Called pt no answer will try later [name]

## 2016-02-08 NOTE — Telephone Encounter (Signed)
[facility]ed [facility] [place] [place]

## 2016-02-11 ENCOUNTER — Telehealth: Payer: Self-pay | Admitting: Internal Medicine

## 2016-02-11 NOTE — Telephone Encounter (Signed)
appt made for tomorrow 02/12/16...[name]

## 2016-02-11 NOTE — Telephone Encounter (Signed)
PLEASE NOTE: All timestamps contained within this report are represented as [place] Standard Time. CONFIDENTIALTY NOTICE: This fax transmission is intended only for the addressee. It contains information that is legally privileged, confidential or otherwise protected from use or disclosure. If you are not the intended recipient, you are strictly prohibited from reviewing, disclosing, copying using or disseminating any of this information or taking any action in reliance on or regarding this information. If you have received this fax in error, please notify us immediately by telephone so that we can arrange for its return to [place]. Phone: [phone], Toll-Free: [phone], Fax: [phone] Page: 1 of 1 Call Id: [id number] [place] Patient Name: [name] DOB: 07-04-1956 Initial Comment Caller states takes insulin and in the area where he gives his shot is sore as well as a little puffy. Nurse Assessment Nurse: Dimas Chyle, RN, Dellis Filbert Date/Time Eilene Ghazi Time): 02/11/2016 3:49:54 PM Confirm and document reason for call. If symptomatic, describe symptoms. You must click the next button to save text entered. ---Caller states takes insulin and in the area where he gives his shot is sore as well as a little puffy. Symptoms started yesterday. Has the patient traveled out of the country within the last 30 days? ---No Does the patient have any new or worsening symptoms? ---Yes Will a triage be completed? ---Yes Related visit to physician within the last 2 weeks? ---No Does the PT have any chronic conditions? (i.e. diabetes, asthma, etc.) ---Yes List chronic conditions. ---HTN, Diabetes type 2 Is this a behavioral health or substance abuse call? ---No Guidelines Guideline Title Affirmed Question Affirmed Notes Skin Lump or Localized Swelling [1] Swelling is painful to touch AND [2] no fever Final Disposition  User See Physician within 24 Hours Dimas Chyle, RN, [employer] Referrals REFERRED TO PCP OFFICE Disagree/Comply: Leta Baptist

## 2016-02-12 ENCOUNTER — Ambulatory Visit (INDEPENDENT_AMBULATORY_CARE_PROVIDER_SITE_OTHER): Payer: Commercial Managed Care - HMO | Admitting: Internal Medicine

## 2016-02-12 ENCOUNTER — Encounter: Payer: Self-pay | Admitting: Internal Medicine

## 2016-02-12 VITALS — BP 144/82 | HR 93 | Temp 98.3°F | Resp 20 | Wt >= 6400 oz

## 2016-02-12 DIAGNOSIS — L03311 Cellulitis of abdominal wall: Secondary | ICD-10-CM | POA: Insufficient documentation

## 2016-02-12 DIAGNOSIS — M150 Primary generalized (osteo)arthritis: Secondary | ICD-10-CM

## 2016-02-12 DIAGNOSIS — E118 Type 2 diabetes mellitus with unspecified complications: Secondary | ICD-10-CM

## 2016-02-12 DIAGNOSIS — Z794 Long term (current) use of insulin: Secondary | ICD-10-CM

## 2016-02-12 DIAGNOSIS — E1142 Type 2 diabetes mellitus with diabetic polyneuropathy: Secondary | ICD-10-CM

## 2016-02-12 DIAGNOSIS — I10 Essential (primary) hypertension: Secondary | ICD-10-CM

## 2016-02-12 DIAGNOSIS — M159 Polyosteoarthritis, unspecified: Secondary | ICD-10-CM

## 2016-02-12 MED ORDER — HYDROCODONE-ACETAMINOPHEN 10-325 MG PO TABS: 1.0000 | ORAL_TABLET | Freq: Three times a day (TID) | ORAL | Status: DC | PRN

## 2016-02-12 MED ORDER — DOXYCYCLINE HYCLATE 100 MG PO TABS: 100.0000 mg | ORAL_TABLET | Freq: Two times a day (BID) | ORAL | Status: DC

## 2016-02-12 NOTE — Assessment & Plan Note (Signed)
Recent uncontrolled, o/w stable overall by history and exam, recent data reviewed with pt, and pt to continue tx, f/u PCP for strict control efforts,  to f/u any worsening symptoms or concerns Lab Results  Component Value Date   HGBA1C 9.8* 12/08/2015

## 2016-02-12 NOTE — Assessment & Plan Note (Signed)
Mild to mod, for antibx course,  to f/u any worsening symptoms or concerns 

## 2016-02-12 NOTE — Patient Instructions (Signed)
Please take all new medication as prescribed - the antiibiotic  Please continue all other medications as before, and refills have been done if requested - the hydrocodone  Please have the pharmacy call with any other refills you may need.  Please keep your appointments with your specialists as you may have planned

## 2016-02-12 NOTE — Progress Notes (Signed)
Pre visit review using our clinic review tool, if applicable. No additional management support is needed unless otherwise documented below in the visit note. 

## 2016-02-12 NOTE — Progress Notes (Signed)
Subjective:    Patient ID: [name], male    DOB: Nov 27, 1955, 60 y.o.   MRN: [id number]  HPI  Here with 2-3 days onset quickly increasing area of mild to mod constant right lower quad abd wall red/tender/swelling without fever, red streaks or redness.  Occurred at a spot where has given an insulin shot.  Pt denies chest pain, increased sob or doe, wheezing, orthopnea, PND, increased LE swelling, palpitations, dizziness or syncope.  Pt denies new neurological symptoms such as new headache, or facial or extremity weakness or numbness   Pt denies polydipsia, polyuria Past Medical History  Diagnosis Date  . Diabetes mellitus     Has Hx of diabetic foot ulcer & peripheral neuropathy  . Hyperlipidemia   . HTN (hypertension)   . Morbid obesity ([place])   . History of PFTs 05/2009    Mild Obstructive defect  . OSA on CPAP   . Anemia   . Colon cancer ([place]) 2007    s/p sigmoid colectomy  . CHF (congestive heart failure) ([facility])     Presumed diastolic. Echo (06/07) w.EF 45%, severe posterior HK, mild LV hypertrophy, No further work-up of abnormal echo was done.   Past Surgical History  Procedure Laterality Date  . Sigmoid colectomy  10/2005    [facility]  . Corneal transplant  [facility]    '80/Right  '84/Left  . Bariatric surgery  12/2009    Lap Band/At [facility]  . Hip surgery      bilateral  . Colonoscopy  multiple  . Tonsillectomy    . Colonoscopy with propofol N/A 11/26/2012    Procedure: COLONOSCOPY WITH PROPOFOL;  Surgeon: Gatha Mayer, MD;  Location: [facility];  Service: Endoscopy;  Laterality: N/A;  may need pre appt. with anesthesia due to morbid obesity  . Incisional hernia repair N/A 12/18/2013    Procedure:  REPAIR OF INCARCERATED INCISIONAL HERNIA;  Surgeon: Rolm Bookbinder, MD;  Location: [place];  Service: General;  Laterality: N/A;  . Insertion of mesh N/A 12/18/2013    Procedure: INSERTION OF MESH;  Surgeon: Rolm Bookbinder, MD;  Location: [place];  Service: General;   Laterality: N/A;  . Eye surgery    . Hernia repair      reports that he has never smoked. He has never used smokeless tobacco. He reports that he does not drink alcohol or use illicit drugs. family history includes CAD in his mother; Congestive Heart Failure in his mother; Diabetes in his father and mother; Heart attack (age of onset: 71) in his brother; Heart failure in his brother; Hepatitis in his mother. There is no history of Cancer. Allergies  Allergen Reactions  . Shellfish Allergy Anaphylaxis  . Lisinopril Cough   Current Outpatient Prescriptions on File Prior to Visit  Medication Sig Dispense Refill  . aspirin EC 325 MG tablet Take 325 mg by mouth every evening.     Marland Kitchen atorvastatin (LIPITOR) 40 MG tablet Take 1 tablet (40 mg total) by mouth daily. 90 tablet 3  . bisacodyl (DULCOLAX) 5 MG EC tablet Take 5 mg by mouth daily as needed for moderate constipation.    . bisoprolol (ZEBETA) 10 MG tablet TAKE 1 TABLET (10 MG TOTAL) BY MOUTH EVERY EVENING. 90 tablet 3  . cholecalciferol (VITAMIN D) 1000 UNITS tablet Take 1,000 Units by mouth daily.    . cycloSPORINE (RESTASIS) 0.05 % ophthalmic emulsion Place 1 drop into both eyes 2 (two) times daily. 0.4 mL 5  . Diclofenac  Sodium (PENNSAID) 1.5 % SOLN Place 0.2 mLs onto the skin 3 (three) times daily. 150 mL 11  . Empagliflozin-Metformin HCl (SYNJARDY) 01-999 MG TABS Take 1 tablet by mouth 2 (two) times daily. 60 tablet 5  . ferrous sulfate 325 (65 FE) MG tablet Take 1 tablet (325 mg total) by mouth 2 (two) times daily with a meal. Reported on 11/05/2015 180 tablet 1  . furosemide (LASIX) 40 MG tablet TAKE 1 TABLET (40 MG TOTAL) BY MOUTH 2 TIMES DAILY. 180 tablet 3  . glucose blood (ONETOUCH VERIO) test strip Use to check blood sugar up to three times a day DX E10.29 300 each 1  . ibuprofen (ADVIL,MOTRIN) 200 MG tablet Take 200 mg by mouth every 6 (six) hours as needed for mild pain.    Marland Kitchen LEVEMIR FLEXTOUCH 100 UNIT/ML Pen INJECT 2.4 MLS (240  UNITS TOTAL) INTO THE SKIN EVERY MORNING. 75 mL 3  . levothyroxine (SYNTHROID, LEVOTHROID) 50 MCG tablet TAKE 1 TABLET BY MOUTH DAILY BEFORE BREAKFAST. 90 tablet 2  . losartan (COZAAR) 25 MG tablet TAKE 1 TABLET (25 MG TOTAL) BY MOUTH DAILY. 90 tablet 3  . Multiple Vitamins-Minerals (ZINC PO) Take by mouth. Reported on 01/19/2016    . polyethylene glycol powder (GLYCOLAX/MIRALAX) powder Take 17 g by mouth daily as needed for mild constipation. 255 g 11  . ULTICARE SHORT PEN NEEDLES 31G X 8 MM MISC Dx E10.9 Use one pen needle once daily with insulin 100 each 11  . ULTICARE SHORT PEN NEEDLES 31G X 8 MM MISC Use with insulin pen daily E11.9 100 each 3  . vitamin C (ASCORBIC ACID) 500 MG tablet Take 1,000 mg by mouth 2 (two) times daily. Reported on 01/19/2016    . vitamin E 200 UNIT capsule Take 400 Units by mouth every morning. Reported on 01/19/2016    . zinc gluconate 50 MG tablet Take 50 mg by mouth daily. Reported on 01/19/2016     No current facility-administered medications on file prior to visit.   Review of Systems  Constitutional: Negative for unusual diaphoresis or night sweats HENT: Negative for ear swelling or discharge Eyes: Negative for worsening visual haziness  Respiratory: Negative for choking and stridor.   Gastrointestinal: Negative for distension or worsening eructation Genitourinary: Negative for retention or change in urine volume.  Musculoskeletal: Negative for other MSK pain or swelling Skin: Negative for color change and worsening wound Neurological: Negative for tremors and numbness other than noted  Psychiatric/Behavioral: Negative for decreased concentration or agitation other than above       Objective:   Physical Exam BP 144/82 mmHg  Pulse 93  Temp(Src) 98.3 F (36.8 C) (Oral)  Resp 20  Wt 484 lb (219.541 kg)  SpO2 94% VS noted,  Constitutional: Pt appears in no apparent distress HENT: Head: NCAT.  Right Ear: External ear normal.  Left Ear: External ear  normal.  Eyes: . Pupils are equal, round, and reactive to light. Conjunctivae and EOM are normal Neck: Normal range of motion. Neck supple.  Cardiovascular: Normal rate and regular rhythm.   Pulmonary/Chest: Effort normal and breath sounds without rales or wheezing.  Abd:  Soft, NT, ND, + BS Neurological: Pt is alert. Not confused , motor grossly intact Skin: Skin is warm. No rash, no LE edema, RLQ abd wall with 1.5 cm area red/tender/swelling without fluctuance or drainage Psychiatric: Pt behavior is normal. No agitation.     Assessment & Plan:

## 2016-02-12 NOTE — Assessment & Plan Note (Signed)
stable overall by history and exam, recent data reviewed with pt, and pt to continue medical treatment as before,  to f/u any worsening symptoms or concerns  BP Readings from Last 3 Encounters:  02/12/16 144/82  02/03/16 148/80  01/22/16 148/80

## 2016-02-12 NOTE — Assessment & Plan Note (Signed)
With chronic pain, for pain med refill today due to persistent symptoms

## 2016-02-16 ENCOUNTER — Ambulatory Visit: Payer: Commercial Managed Care - HMO

## 2016-02-18 DIAGNOSIS — G4733 Obstructive sleep apnea (adult) (pediatric): Secondary | ICD-10-CM | POA: Diagnosis not present

## 2016-02-18 DIAGNOSIS — R6889 Other general symptoms and signs: Secondary | ICD-10-CM | POA: Diagnosis not present

## 2016-02-24 DIAGNOSIS — M1610 Unilateral primary osteoarthritis, unspecified hip: Secondary | ICD-10-CM | POA: Diagnosis not present

## 2016-02-24 DIAGNOSIS — E139 Other specified diabetes mellitus without complications: Secondary | ICD-10-CM | POA: Diagnosis not present

## 2016-02-24 DIAGNOSIS — R609 Edema, unspecified: Secondary | ICD-10-CM | POA: Diagnosis not present

## 2016-02-24 DIAGNOSIS — G4733 Obstructive sleep apnea (adult) (pediatric): Secondary | ICD-10-CM | POA: Diagnosis not present

## 2016-02-24 DIAGNOSIS — M150 Primary generalized (osteo)arthritis: Secondary | ICD-10-CM | POA: Diagnosis not present

## 2016-02-24 DIAGNOSIS — J449 Chronic obstructive pulmonary disease, unspecified: Secondary | ICD-10-CM | POA: Diagnosis not present

## 2016-03-02 ENCOUNTER — Other Ambulatory Visit: Payer: Self-pay

## 2016-03-02 NOTE — Patient Outreach (Signed)
[place] [facility]) Care Management  03/02/2016  [name] 1956-01-01 [id number]   Telephonic Monthly Assessment   Outreach call attempt #1 to patient.  Patient not reached; no answer following several rings.  RN CM scheduled for next contact call within one week.   Mariann Laster, RN, BSN, [facility], [place] Management Care Management Coordinator [phone] Direct [phone] Cell [phone] Office [phone] Fax [name]

## 2016-03-03 ENCOUNTER — Other Ambulatory Visit: Payer: Self-pay

## 2016-03-03 NOTE — Patient Outreach (Signed)
[place] [facility]) Care Management  03/03/2016  [name] 01-14-1956 [id number]  Telephonic Monthly Assessment   Outreach call attempt #2 to patient. Patient not reached; no answer following several rings.  RN CM scheduled for next contact call within one week.   [name] Laster, RN, BSN, [facility], [place] Management Care Management Coordinator [phone] Direct [phone] Cell [phone] Office [phone] Fax [name]

## 2016-03-09 ENCOUNTER — Other Ambulatory Visit: Payer: Self-pay

## 2016-03-09 NOTE — Patient Outreach (Signed)
[place] [facility]) Care Management  03/09/2016  [name] 1955-10-27 [id number]   Telephonic Monthly Assessment   Outreach call attempt #3 to patient. Patient not reached; no answer following several rings.  RN CM mailed unable to reach letter.  RN CM will review case in two weeks and close if no response.   Mariann Laster, RN, BSN, [facility], [place] Care Management Care Management Coordinator [phone] Direct [phone] Cell [phone] Office [phone] Fax [email or web address]

## 2016-03-19 DIAGNOSIS — G4733 Obstructive sleep apnea (adult) (pediatric): Secondary | ICD-10-CM | POA: Diagnosis not present

## 2016-03-23 ENCOUNTER — Other Ambulatory Visit: Payer: Self-pay

## 2016-03-23 NOTE — Patient Outreach (Addendum)
[place] [facility]) Care Management  03/23/2016  Dorion Sommerfeld 10-19-1955 [id number]   Case Closure  Patient unable to reach following 3 attempts and sending unsuccessful outreach letter.  RN CM notified [facility] of closure. RN CM notified MD:  Dr. Scarlette Calico via case closure letter.   Nathaneil Canary, BSN, RN, [place] Care Management Care Management Coordinator [phone] Direct [phone] Cell [phone] Office [phone] Fax [email or web address]

## 2016-03-25 ENCOUNTER — Telehealth: Payer: Self-pay

## 2016-03-25 NOTE — Telephone Encounter (Signed)
Patient called and said the paper work that was suppose to be sent to the [facility], they are saying that they did not get it. He also states that he can not find his copy of the paperwork. If he can find the paperwork all he needs from [place] is something saying that we did fax it. He states he knows we did, he had talk to the nurse about it.. IF he cant find the paper he needs [place] to fax it again. He will call back later this afternoon. Please advise or follow up, Thank you.

## 2016-03-25 NOTE — Telephone Encounter (Signed)
Spoke with pt who acknowledge that paperwork has been faxed but apparently was not received by [employer].  Pt requests that paperwork be mailed to [facility] and copy be mailed to him as well (both done).  Pt states that he found his copy, and he will fax it to the [facility] but would still like another copy mailed to him.

## 2016-03-29 ENCOUNTER — Other Ambulatory Visit: Payer: Self-pay | Admitting: Internal Medicine

## 2016-04-07 ENCOUNTER — Ambulatory Visit: Payer: Commercial Managed Care - HMO | Admitting: Internal Medicine

## 2016-04-11 ENCOUNTER — Telehealth: Payer: Self-pay

## 2016-04-11 ENCOUNTER — Ambulatory Visit: Payer: Commercial Managed Care - HMO | Admitting: Pulmonary Disease

## 2016-04-11 NOTE — Telephone Encounter (Signed)
Can you please call [name] back at [phone]) She is with [place]. She has some question about this patient. Thank you.

## 2016-04-12 ENCOUNTER — Telehealth: Payer: Self-pay

## 2016-04-12 ENCOUNTER — Other Ambulatory Visit: Payer: Self-pay | Admitting: Internal Medicine

## 2016-04-12 DIAGNOSIS — E08621 Diabetes mellitus due to underlying condition with foot ulcer: Secondary | ICD-10-CM

## 2016-04-12 DIAGNOSIS — L97519 Non-pressure chronic ulcer of other part of right foot with unspecified severity: Secondary | ICD-10-CM

## 2016-04-12 DIAGNOSIS — I5032 Chronic diastolic (congestive) heart failure: Secondary | ICD-10-CM

## 2016-04-12 DIAGNOSIS — E0821 Diabetes mellitus due to underlying condition with diabetic nephropathy: Secondary | ICD-10-CM

## 2016-04-12 NOTE — Telephone Encounter (Signed)
[name] called back again. She has now went to see the patient. He is wanting nursing assistants help to help him take a bath.  He can't get to his legs and feet. So she is needing verbal orders to go forward with this. She states you can leave her a VM with the orders. Please follow up, Thank you.

## 2016-04-12 NOTE — Telephone Encounter (Signed)
Referral ordered

## 2016-04-13 IMAGING — DX DG CHEST 2V
2 series · 2 of 2 positions shown · non-contrast
Comparison: 01/14/2015

CLINICAL DATA: Sore throat, congestion, cough for 6 days,
hypertension, CHF, diabetes mellitus

EXAM:
CHEST  2 VIEW

[chest pa]
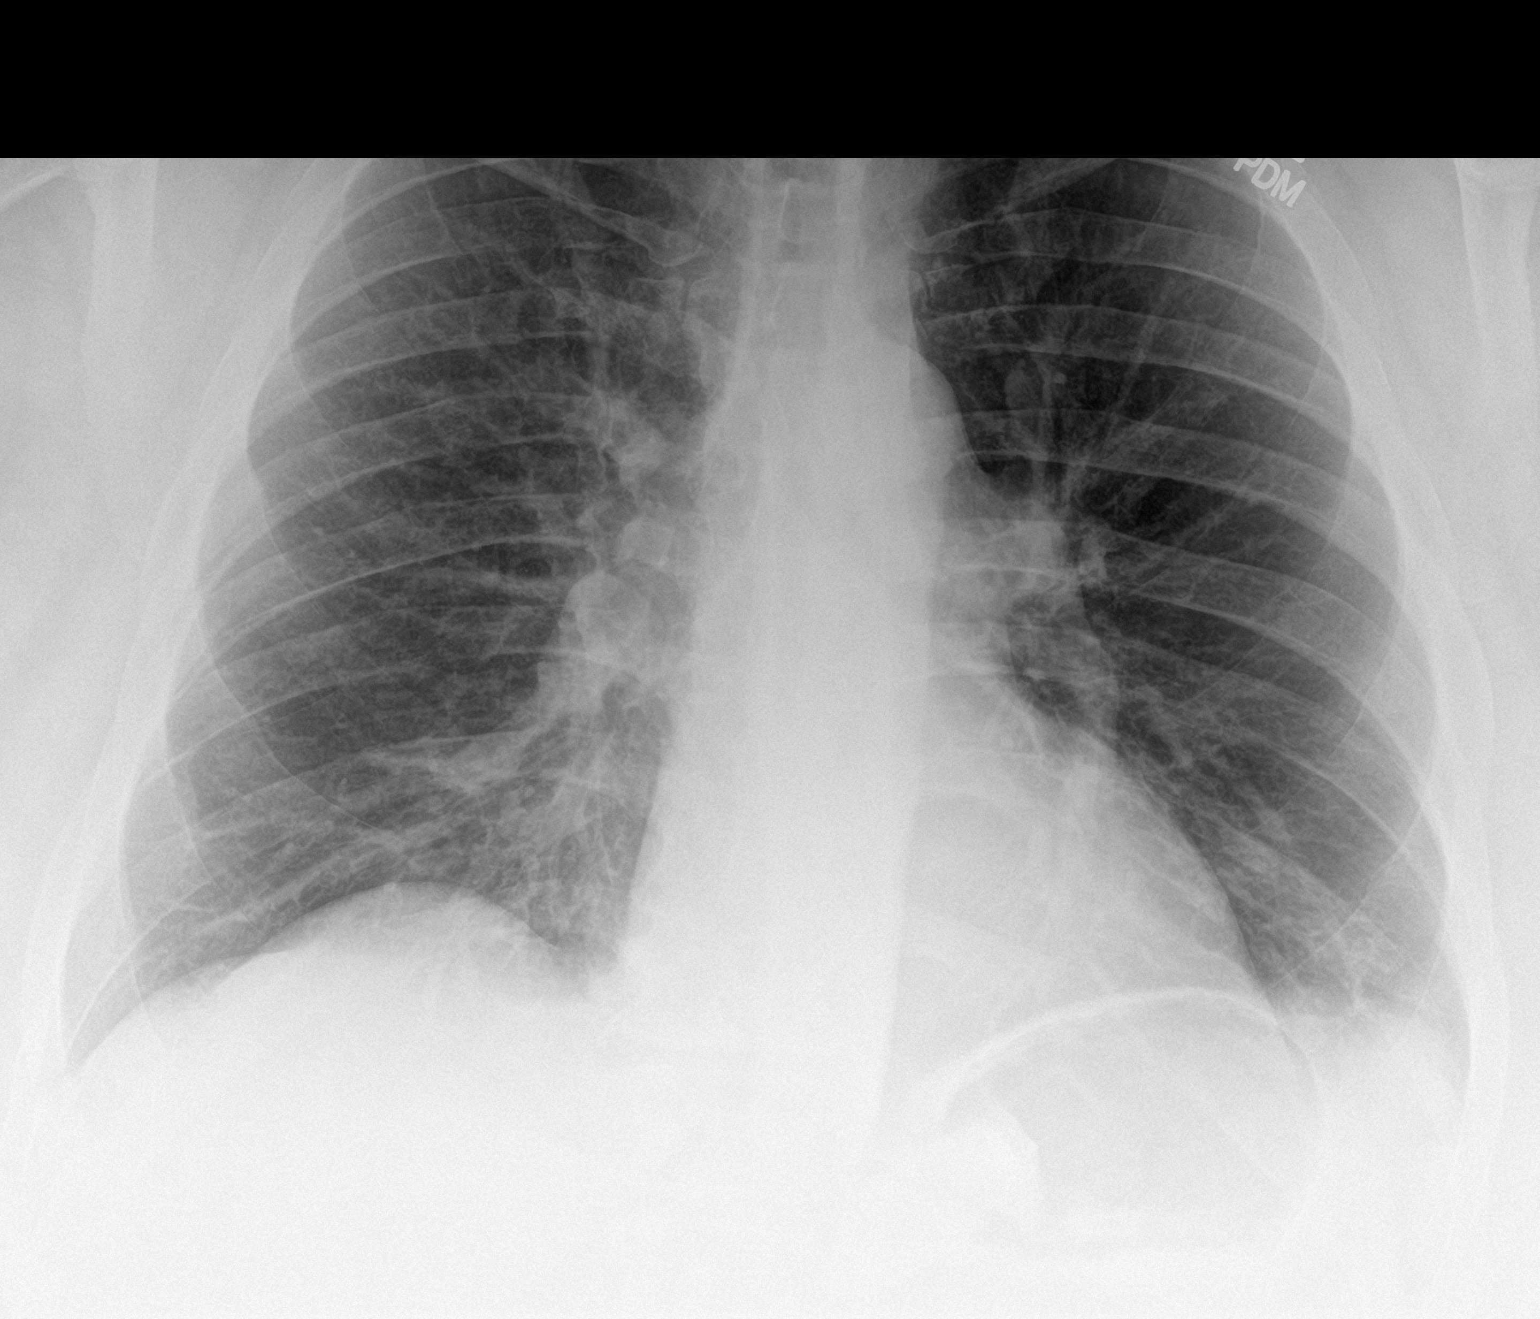

[chest lat]
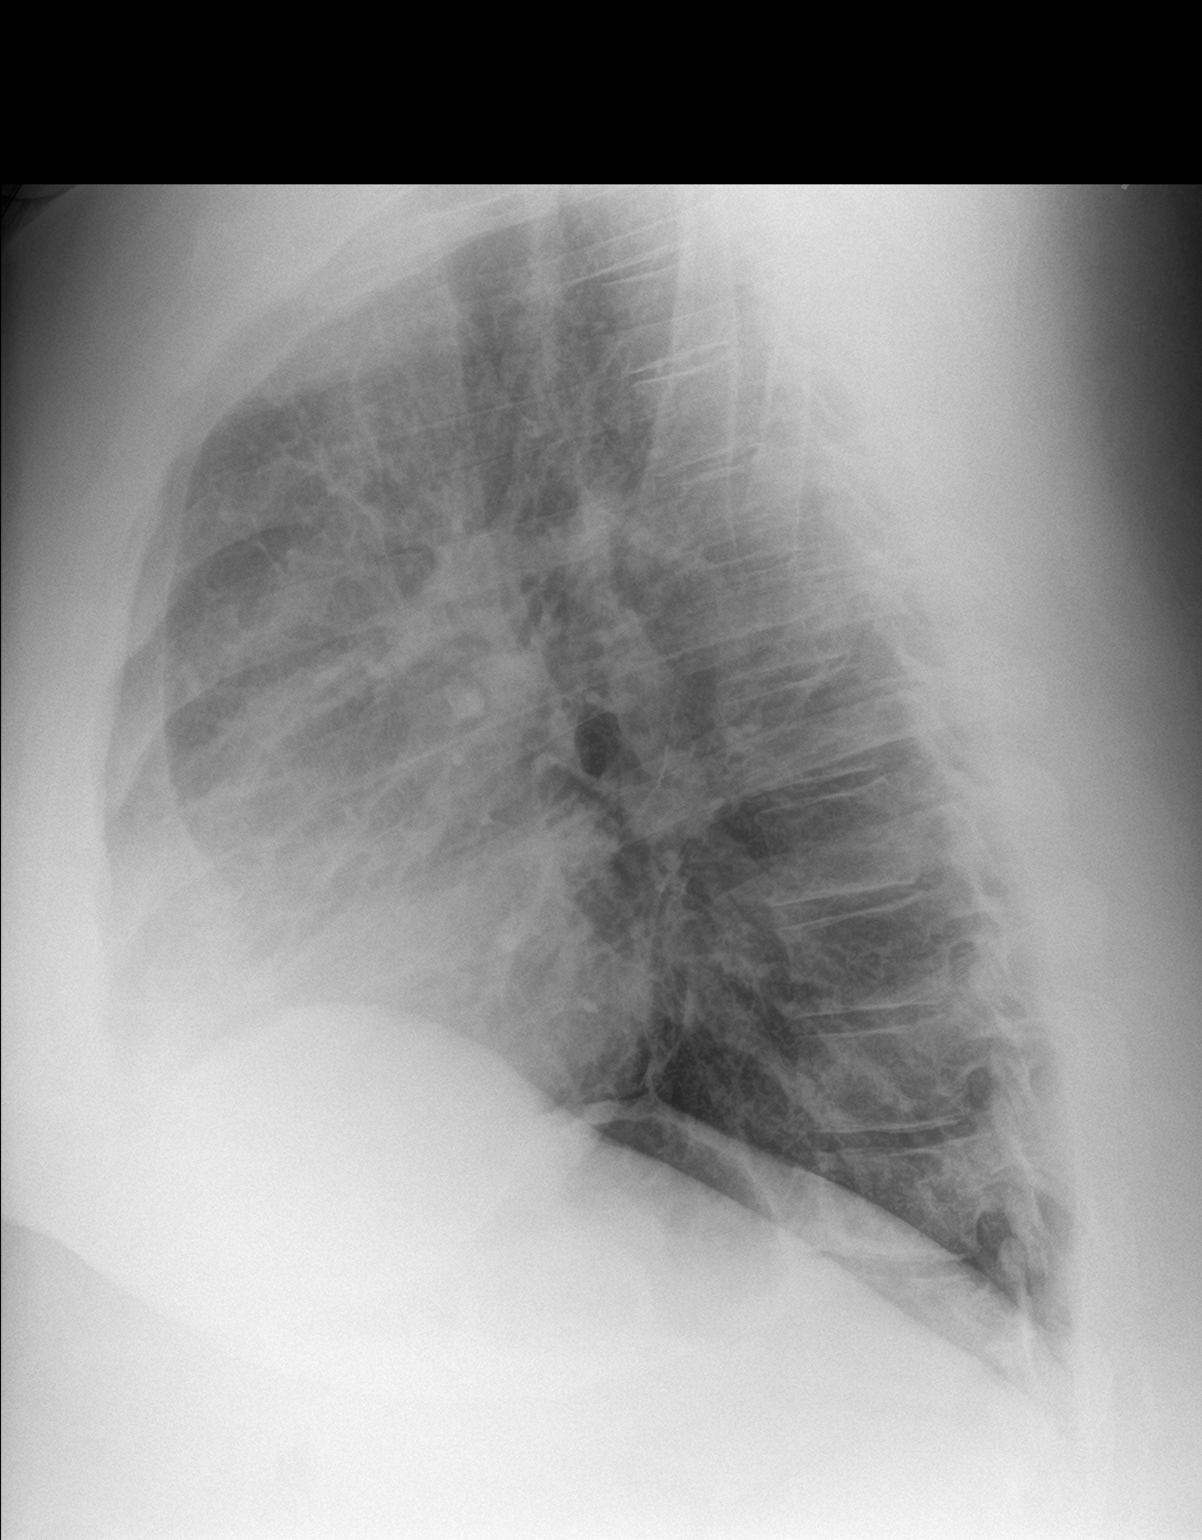

[2 of 2 positions shown; findings below may reference images not displayed]

FINDINGS: Upper normal heart size.

Question minimally prominent central pulmonary arteries.

Mediastinal contours normal.

Mild bibasilar atelectasis.

No acute infiltrate, pleural effusion or pneumothorax.

Bones unremarkable.
IMPRESSION: Bibasilar atelectasis.

## 2016-04-13 NOTE — Telephone Encounter (Signed)
I do not know who to call about this.

## 2016-04-13 NOTE — Telephone Encounter (Signed)
yes

## 2016-04-13 NOTE — Telephone Encounter (Signed)
Verbal okay for assistance with bathing?

## 2016-04-14 NOTE — Telephone Encounter (Signed)
Called and left voicemail with michelle at [facility] ([phone])----per dr [name], ok to have assistance with bathing

## 2016-04-19 DIAGNOSIS — G4733 Obstructive sleep apnea (adult) (pediatric): Secondary | ICD-10-CM | POA: Diagnosis not present

## 2016-04-21 ENCOUNTER — Other Ambulatory Visit: Payer: Self-pay | Admitting: Internal Medicine

## 2016-04-21 ENCOUNTER — Telehealth: Payer: Self-pay | Admitting: Emergency Medicine

## 2016-04-21 DIAGNOSIS — E1142 Type 2 diabetes mellitus with diabetic polyneuropathy: Secondary | ICD-10-CM

## 2016-04-21 MED ORDER — HYDROCODONE-ACETAMINOPHEN 10-325 MG PO TABS: 1.0000 | ORAL_TABLET | Freq: Three times a day (TID) | ORAL | 0 refills | Status: DC | PRN

## 2016-04-21 NOTE — Telephone Encounter (Signed)
Pt called and needs a prescription refill on HYDROcodone-acetaminophen (NORCO) 10-325 MG tablet. Please follow up thanks.

## 2016-04-21 NOTE — Telephone Encounter (Signed)
Rx written.

## 2016-04-21 NOTE — Telephone Encounter (Signed)
Last filled on 02/12/16 #90 no refills, pt was seen on 02/12/16. Okay to refill?

## 2016-04-21 NOTE — Telephone Encounter (Signed)
Notified pt rx ready for pick-up.../lmb 

## 2016-04-26 ENCOUNTER — Telehealth: Payer: Self-pay | Admitting: Emergency Medicine

## 2016-04-26 DIAGNOSIS — Z794 Long term (current) use of insulin: Principal | ICD-10-CM

## 2016-04-26 DIAGNOSIS — E118 Type 2 diabetes mellitus with unspecified complications: Secondary | ICD-10-CM

## 2016-04-26 NOTE — Telephone Encounter (Signed)
Wants to know if you can refax the diabetic show forms back over thanks. Fax #: [phone]

## 2016-04-27 NOTE — Telephone Encounter (Signed)
LVM for pt to call back as soon as possible.   RE: need to know whom we faxed form to.  I do not have a copy in patient's media tab.

## 2016-04-28 ENCOUNTER — Encounter: Payer: Self-pay | Admitting: Emergency Medicine

## 2016-04-28 NOTE — Telephone Encounter (Signed)
Needs a referral for Podiatry.   [place] with Dr. Fayrene Fearing  Referral has pended for PCP review. If PCP agrees, sign order in this encounter.  Pt does not need DM shoe form.

## 2016-04-29 ENCOUNTER — Other Ambulatory Visit: Payer: Self-pay | Admitting: Internal Medicine

## 2016-04-29 DIAGNOSIS — E08621 Diabetes mellitus due to underlying condition with foot ulcer: Secondary | ICD-10-CM

## 2016-04-29 DIAGNOSIS — L97519 Non-pressure chronic ulcer of other part of right foot with unspecified severity: Principal | ICD-10-CM

## 2016-04-29 NOTE — Telephone Encounter (Signed)
Pt stated he gave [place] wrong number to contact for DM shoe. Please use [phone] att Wende Neighbors to help get this done.

## 2016-05-04 NOTE — Telephone Encounter (Signed)
Received fax. Filled out information. Printed OV note.  Given to PCP to sign.

## 2016-05-04 NOTE — Telephone Encounter (Signed)
Called [place]. She if faxing over the form needed.

## 2016-05-05 NOTE — Telephone Encounter (Signed)
Received form. Form has been filled out and signed by PCP.  Form faxed

## 2016-05-05 NOTE — Telephone Encounter (Signed)
Pt aware.

## 2016-05-11 ENCOUNTER — Ambulatory Visit: Payer: Commercial Managed Care - HMO | Admitting: Podiatry

## 2016-05-20 ENCOUNTER — Telehealth: Payer: Self-pay | Admitting: Cardiology

## 2016-05-20 ENCOUNTER — Encounter: Payer: Self-pay | Admitting: Nurse Practitioner

## 2016-05-20 DIAGNOSIS — G4733 Obstructive sleep apnea (adult) (pediatric): Secondary | ICD-10-CM | POA: Diagnosis not present

## 2016-05-20 NOTE — Telephone Encounter (Signed)
Patient states he experienced chest pain last evening.  Currently his right arm is numb.  Please call.

## 2016-05-20 NOTE — Telephone Encounter (Signed)
Pt last seen by Dr Aundra Dubin 05/16/2014.   I spoke with the pt and and he complains of being awoken last night from his sleep with right arm numbness, right side chest pressure/pain and feeling sluggish. These symptoms continued through out the night.  The pt took 2 pain pills of hydrocodone around 1 AM and his chest pain/pressure subsided around 10 AM. The pt continues to have right arm/shoulder numbness.  The pt's symptoms are not aggravated by movement or inspiration.  The pt does not have access to check vitals or weigh. Last weight at PCP was 457 lbs. The pt has not taken his medications today and he has started to try and exercise in the past 2 weeks. The pt also states that he is very SOB when walking 10 ft and has swelling in left leg that has not gone away in the past 2 weeks that normally resolves when soaking extremity.  The pt did have a similar pain 2 years ago and states that he just stayed still when he had symptoms. The pt uses [facility] for transportation and they require a 1 day notice for appointment to assist with transportation. I have scheduled the pt to be seen on 05/24/16 with Truitt Merle NP. I will forward this message to Dr Aundra Dubin for any further recommendations if needed.

## 2016-05-24 ENCOUNTER — Ambulatory Visit: Payer: Commercial Managed Care - HMO | Admitting: Nurse Practitioner

## 2016-05-24 NOTE — Progress Notes (Deleted)
CARDIOLOGY OFFICE NOTE  Date:  05/24/2016    [name] Date of Birth: [birth year] Medical Record [id number]  PCP:  Scarlette Calico, MD  Cardiologist:  Aundra Dubin  No chief complaint on file.   History of Present Illness: [name] is a 60 y.o. male who presents today for a work in visit. Seen for Dr. Aundra Dubin.   He has not been seen since August of 2015.   He has a history of morbid obesity, diabetes, & diastolic CHF. He has had prior ACE related cough.  He has limited mobility due to size and uses a rolling walker.  He has had a ventral hernia repair that was complicated by post-op infection.    Called this past Friday  - "I spoke with the pt and and he complains of being awoken last night from his sleep with right arm numbness, right side chest pressure/pain and feeling sluggish. These symptoms continued through out the night.  The pt took 2 pain pills of hydrocodone around 1 AM and his chest pain/pressure subsided around 10 AM. The pt continues to have right arm/shoulder numbness.  The pt's symptoms are not aggravated by movement or inspiration.  The pt does not have access to check vitals or weigh. Last weight at PCP was 457 lbs. The pt has not taken his medications today and he has started to try and exercise in the past 2 weeks. The pt also states that he is very SOB when walking 10 ft and has swelling in left leg that has not gone away in the past 2 weeks that normally resolves when soaking extremity.  The pt did have a similar pain 2 years ago and states that he just stayed still when he had symptoms. The pt uses [employer] for transportation and they require a 1 day notice for appointment to assist with transportation. I have scheduled the pt to be seen on 05/24/16 with Truitt Merle NP. I will forward this message to Dr Aundra Dubin for any further recommendations if needed."  Comes in today. Here with     Past Medical History: 1. Diabetes mellitus.  Has history of diabetic foot ulcer  and diabetic peripheral neuropathy. .  2. Hyperlipidemia 3. Hypertension: ACEI cough.  4. Colon cancer: s/p sigmoid colectomy in 2007 5. Morbid obesity s/p lap band procedure.  6. Anemia 7. Obstructive Sleep Apnea: Uses CPAP 8. CHF: Diastolic.  Echo (5/12): EF 50-55%, mild LV dilation, mild LVH, moderate (grade II) diastolic dysfunction.  Echo (9/14) with EF 50-55%, mild LVH, moderately dilated LV, RV poorly visualized.  9. PFTs (9/10): mild obstructive defect 10.  Dobutamine stress echo (9/10): EF 55%, mild LV hypertrophy, no significant valvular disease, no ischemia or infarction  11. CKD 12. Ventral hernia s/p repair.    Past Medical History:  Diagnosis Date  . Anemia   . CHF (congestive heart failure) (HCC)    Presumed diastolic. Echo (06/07) w.EF 45%, severe posterior HK, mild LV hypertrophy, No further work-up of abnormal echo was done.  . Colon cancer [facility]) 2007   s/p sigmoid colectomy  . Diabetes mellitus    Has Hx of diabetic foot ulcer & peripheral neuropathy  . History of PFTs 05/2009   Mild Obstructive defect  . HTN (hypertension)   . Hyperlipidemia   . Morbid obesity ([place])   . OSA on CPAP     Past Surgical History:  Procedure Laterality Date  . BARIATRIC SURGERY  12/2009   Lap Band/At [facility]  .  COLONOSCOPY  multiple  . COLONOSCOPY WITH PROPOFOL N/A 11/26/2012   Procedure: COLONOSCOPY WITH PROPOFOL;  Surgeon: Gatha Mayer, MD;  Location: [facility];  Service: Endoscopy;  Laterality: N/A;  may need pre appt. with anesthesia due to morbid obesity  . [place]   '80/Right  '84/Left  . EYE SURGERY    . HERNIA REPAIR    . HIP SURGERY     bilateral  . INCISIONAL HERNIA REPAIR N/A 12/18/2013   Procedure:  REPAIR OF INCARCERATED INCISIONAL HERNIA;  Surgeon: Rolm Bookbinder, MD;  Location: [facility];  Service: General;  Laterality: N/A;  . INSERTION OF MESH N/A 12/18/2013   Procedure: INSERTION OF MESH;  Surgeon: Rolm Bookbinder, MD;  Location: [facility];  Service: General;  Laterality: N/A;  . Sigmoid Colectomy  10/2005   [facility]  . TONSILLECTOMY       Medications: Current Outpatient Prescriptions  Medication Sig Dispense Refill  . aspirin EC 325 MG tablet Take 325 mg by mouth every evening.     Marland Kitchen atorvastatin (LIPITOR) 40 MG tablet Take 1 tablet (40 mg total) by mouth daily. 90 tablet 3  . bisacodyl (DULCOLAX) 5 MG EC tablet Take 5 mg by mouth daily as needed for moderate constipation.    . bisoprolol (ZEBETA) 10 MG tablet TAKE 1 TABLET (10 MG TOTAL) BY MOUTH EVERY EVENING. 90 tablet 3  . cholecalciferol (VITAMIN D) 1000 UNITS tablet Take 1,000 Units by mouth daily.    . cycloSPORINE (RESTASIS) 0.05 % ophthalmic emulsion Place 1 drop into both eyes 2 (two) times daily. 0.4 mL 5  . Diclofenac Sodium (PENNSAID) 1.5 % SOLN Place 0.2 mLs onto the skin 3 (three) times daily. 150 mL 11  . doxycycline (VIBRA-TABS) 100 MG tablet Take 1 tablet (100 mg total) by mouth 2 (two) times daily. 20 tablet 0  . Empagliflozin-Metformin HCl (SYNJARDY) 01-999 MG TABS Take 1 tablet by mouth 2 (two) times daily. 60 tablet 5  . ferrous sulfate 325 (65 FE) MG tablet Take 1 tablet (325 mg total) by mouth 2 (two) times daily with a meal. Reported on 11/05/2015 180 tablet 1  . furosemide (LASIX) 40 MG tablet TAKE 1 TABLET (40 MG TOTAL) BY MOUTH 2 TIMES DAILY. 180 tablet 3  . glucose blood (ONETOUCH VERIO) test strip Use to check blood sugar up to three times a day DX E10.29 300 each 1  . HYDROcodone-acetaminophen (NORCO) 10-325 MG tablet Take 1 tablet by mouth every 8 (eight) hours as needed. 90 tablet 0  . ibuprofen (ADVIL,MOTRIN) 200 MG tablet Take 200 mg by mouth every 6 (six) hours as needed for mild pain.    Marland Kitchen LEVEMIR FLEXTOUCH 100 UNIT/ML Pen INJECT 2.4 MLS (240 UNITS TOTAL) INTO THE SKIN EVERY MORNING. 75 mL 5  . levothyroxine (SYNTHROID, LEVOTHROID) 50 MCG tablet TAKE 1 TABLET BY MOUTH DAILY BEFORE BREAKFAST. 90 tablet 2  . losartan (COZAAR) 25 MG tablet  TAKE 1 TABLET (25 MG TOTAL) BY MOUTH DAILY. 90 tablet 3  . Multiple Vitamins-Minerals (ZINC PO) Take by mouth. Reported on 01/19/2016    . polyethylene glycol powder (GLYCOLAX/MIRALAX) powder Take 17 g by mouth daily as needed for mild constipation. 255 g 11  . ULTICARE SHORT PEN NEEDLES 31G X 8 MM MISC Dx E10.9 Use one pen needle once daily with insulin 100 each 11  . ULTICARE SHORT PEN NEEDLES 31G X 8 MM MISC Use with insulin pen daily E11.9 100 each 3  .  vitamin C (ASCORBIC ACID) 500 MG tablet Take 1,000 mg by mouth 2 (two) times daily. Reported on 01/19/2016    . vitamin E 200 UNIT capsule Take 400 Units by mouth every morning. Reported on 01/19/2016    . zinc gluconate 50 MG tablet Take 50 mg by mouth daily. Reported on 01/19/2016     No current facility-administered medications for this visit.     Allergies: Allergies  Allergen Reactions  . Shellfish Allergy Anaphylaxis  . Lisinopril Cough    Social History: The patient  reports that he has never smoked. He has never used smokeless tobacco. He reports that he does not drink alcohol or use drugs.   Family History: The patient's family history includes CAD in his mother; Congestive Heart Failure in his mother; Diabetes in his father and mother; Heart attack (age of onset: 87) in his brother; Heart failure in his brother; Hepatitis in his mother.   Review of Systems: Please see the history of present illness.   Otherwise, the review of systems is positive for none.   All other systems are reviewed and negative.   Physical Exam: VS:  There were no vitals taken for this visit. Marland Kitchen  BMI There is no height or weight on file to calculate BMI.  Wt Readings from Last 3 Encounters:  02/12/16 (!) 484 lb (219.5 kg)  02/03/16 (!) 472 lb (214.1 kg)  01/22/16 (!) 472 lb (214.1 kg)    General: Pleasant. Well developed, well nourished and in no acute distress.   HEENT: Normal.  Neck: Supple, no JVD, carotid bruits, or masses noted.  Cardiac:  Regular rate and rhythm. No murmurs, rubs, or gallops. No edema.  Respiratory:  Lungs are clear to auscultation bilaterally with normal work of breathing.  GI: Soft and nontender.  MS: No deformity or atrophy. Gait and ROM intact.  Skin: Warm and dry. Color is normal.  Neuro:  Strength and sensation are intact and no gross focal deficits noted.  Psych: Alert, appropriate and with normal affect.   LABORATORY DATA:  EKG:  EKG is ordered today. This demonstrates .  Lab Results  Component Value Date   WBC 8.2 12/08/2015   HGB 10.6 (L) 12/08/2015   HCT 32.7 (L) 12/08/2015   PLT 248.0 12/08/2015   GLUCOSE 140 (H) 12/08/2015   CHOL 132 06/05/2015   TRIG 112.0 06/05/2015   HDL 29.10 (L) 06/05/2015   LDLDIRECT 136.8 12/12/2006   LDLCALC 80 06/05/2015   ALT 24 12/08/2015   AST 23 12/08/2015   NA 141 12/08/2015   K 4.2 12/08/2015   CL 104 12/08/2015   CREATININE 1.50 12/08/2015   BUN 20 12/08/2015   CO2 29 12/08/2015   TSH 1.18 12/08/2015   PSA 0.92 08/11/2014   HGBA1C 9.8 (H) 12/08/2015   MICROALBUR 5.5 (H) 12/08/2015    BNP (last 3 results) No results for input(s): BNP in the last 8760 hours.  ProBNP (last 3 results) No results for input(s): PROBNP in the last 8760 hours.   Other Studies Reviewed Today:  Echo Study Conclusions 05/2013  - Left ventricle: The cavity size was moderately dilated. Wall thickness was increased in a pattern of mild LVH. Systolic function was normal. The estimated ejection fraction was in the range of 50% to 55%. Wall motion was normal; there were no regional wall motion abnormalities. Doppler parameters are consistent with abnormal left ventricular relaxation (grade 1 diastolic dysfunction). - Left atrium: The atrium was mildly dilated.  Assessment/Plan:  1.  Chronic diastolic CHF: Exam is difficult for volume due to his morbid obesity.   2. Morbid Obesity: Weight loss is imperative but will be difficult given his limited  mobility.   3. HTN:   4. Hyperlipidemia:   5. DM - looks to be uncontrolled.   Current medicines are reviewed with the patient today.  The patient does not have concerns regarding medicines other than what has been noted above.  The following changes have been made:  See above.  Labs/ tests ordered today include:   No orders of the defined types were placed in this encounter.    Disposition:   FU with    Patient is agreeable to this plan and will call if any problems develop in the interim.   Signed: Burtis Junes, RN, ANP-C 05/24/2016 3:31 PM  [facility] [address] [place], [place]  [address] Phone: [phone] Fax: [phone]

## 2016-05-25 ENCOUNTER — Encounter: Payer: Self-pay | Admitting: Podiatry

## 2016-05-25 ENCOUNTER — Ambulatory Visit (INDEPENDENT_AMBULATORY_CARE_PROVIDER_SITE_OTHER): Payer: Commercial Managed Care - HMO | Admitting: Podiatry

## 2016-05-25 VITALS — BP 127/71 | HR 69 | Resp 16

## 2016-05-25 DIAGNOSIS — L89891 Pressure ulcer of other site, stage 1: Secondary | ICD-10-CM

## 2016-05-25 DIAGNOSIS — E1149 Type 2 diabetes mellitus with other diabetic neurological complication: Secondary | ICD-10-CM | POA: Diagnosis not present

## 2016-05-25 DIAGNOSIS — B351 Tinea unguium: Secondary | ICD-10-CM | POA: Diagnosis not present

## 2016-05-25 DIAGNOSIS — L97521 Non-pressure chronic ulcer of other part of left foot limited to breakdown of skin: Secondary | ICD-10-CM

## 2016-05-25 DIAGNOSIS — M79676 Pain in unspecified toe(s): Secondary | ICD-10-CM

## 2016-05-25 NOTE — Progress Notes (Signed)
   Subjective:    Patient ID: [name], male    DOB: 06-20-1956, 60 y.o.   MRN: [id number]  HPI this patient presents the office with chief complaint of an ulcer under his big toe left foot. He says this ulcer was frequently seen at the wound care center under treatment of Dr. Crissie Sickles.  He says his new doctor, Dr. [name] recommended he make an appointment and be seen here by the podiatrist. This patient is diabetic with peripheral neuropathy as well as kidney disease. He does admit to having injured his big toenail on his right great toe causing it to come off. He also admits to causing a cut on the top of his right great toe which is now healing with no evidence of any infection or drainage. He presents the office today for an evaluation and treatment of his feet    Review of Systems  All other systems reviewed and are negative.      Objective:   Physical Exam GENERAL APPEARANCE: Alert, conversant. Appropriately groomed. No acute distress.  VASCULAR: Pedal pulses are  palpable at  DP  bilateral. PT pulses are absent due to swelling ankle bilateral  Capillary refill time is immediate to all digits,  Normal temperature gradient.   NEUROLOGIC: sensation is absent  to 5.07 monofilament at 5/5 sites bilateral.  Light touch is diminished bilateral, Muscle strength normal.  MUSCULOSKELETAL: acceptable muscle strength, tone and stability bilateral.  Intrinsic muscluature intact bilateral.  Rectus appearance of foot and digits noted bilateral. Severe HAV  B/L  DERMATOLOGIC: skin color, texture, and turgor are within normal limits.  No preulcerative lesions or ulcers  are seen, no interdigital maceration noted.  Diabetic ulcer left hallux healing with hyperkeratotic roof.   No drainage noted.  NAILS  Thick disfigured discolored nails both feet. Right hallux avulsion.         Assessment & Plan:  Diabetic ulcer  Left hallux.  Diabetic neuropathy.  IE  Debridement of ulcer.  Patient was given  diabetic. Home instructions. He was told to return to the office in 3 months for continued foot evaluation.  Patient was told to wear his diabetic shoes since he does have loss of his LOPS.

## 2016-05-26 ENCOUNTER — Encounter: Payer: Self-pay | Admitting: Nurse Practitioner

## 2016-06-09 ENCOUNTER — Telehealth: Payer: Self-pay | Admitting: *Deleted

## 2016-06-09 DIAGNOSIS — E1142 Type 2 diabetes mellitus with diabetic polyneuropathy: Secondary | ICD-10-CM

## 2016-06-09 MED ORDER — HYDROCODONE-ACETAMINOPHEN 10-325 MG PO TABS: 1.0000 | ORAL_TABLET | Freq: Three times a day (TID) | ORAL | 0 refills | Status: DC | PRN

## 2016-06-09 NOTE — Telephone Encounter (Signed)
Notified pt  rx ready for pick-up. He stated his sister has an appt this after (diane) she will pick rx up for him...Johny Chess

## 2016-06-09 NOTE — Telephone Encounter (Signed)
Received call pt is requesting refill on his Hydrocodone. MD out of office pls advise...Johny Chess

## 2016-06-09 NOTE — Telephone Encounter (Signed)
[place] controlled substance database checked.    rx printed.

## 2016-06-19 DIAGNOSIS — G4733 Obstructive sleep apnea (adult) (pediatric): Secondary | ICD-10-CM | POA: Diagnosis not present

## 2016-06-28 DIAGNOSIS — I10 Essential (primary) hypertension: Secondary | ICD-10-CM | POA: Diagnosis not present

## 2016-06-28 DIAGNOSIS — Z794 Long term (current) use of insulin: Secondary | ICD-10-CM | POA: Diagnosis not present

## 2016-06-28 DIAGNOSIS — H35373 Puckering of macula, bilateral: Secondary | ICD-10-CM | POA: Diagnosis not present

## 2016-06-28 DIAGNOSIS — R6889 Other general symptoms and signs: Secondary | ICD-10-CM | POA: Diagnosis not present

## 2016-06-28 DIAGNOSIS — Z85038 Personal history of other malignant neoplasm of large intestine: Secondary | ICD-10-CM | POA: Diagnosis not present

## 2016-06-28 DIAGNOSIS — Z833 Family history of diabetes mellitus: Secondary | ICD-10-CM | POA: Diagnosis not present

## 2016-06-28 DIAGNOSIS — E113513 Type 2 diabetes mellitus with proliferative diabetic retinopathy with macular edema, bilateral: Secondary | ICD-10-CM | POA: Diagnosis not present

## 2016-06-28 DIAGNOSIS — H2513 Age-related nuclear cataract, bilateral: Secondary | ICD-10-CM | POA: Diagnosis not present

## 2016-06-28 DIAGNOSIS — H52213 Irregular astigmatism, bilateral: Secondary | ICD-10-CM | POA: Diagnosis not present

## 2016-06-28 DIAGNOSIS — H1713 Central corneal opacity, bilateral: Secondary | ICD-10-CM | POA: Diagnosis not present

## 2016-07-20 DIAGNOSIS — G4733 Obstructive sleep apnea (adult) (pediatric): Secondary | ICD-10-CM | POA: Diagnosis not present

## 2016-07-25 ENCOUNTER — Telehealth: Payer: Self-pay | Admitting: *Deleted

## 2016-07-25 NOTE — Telephone Encounter (Signed)
Rec'd call pt requesting refill on the Hydrocodone. No recent UDS on file. Last ov 02/12/16...[name]

## 2016-07-25 NOTE — Telephone Encounter (Signed)
Called pt no answer x's 10 rings,,,/lmb

## 2016-07-25 NOTE — Telephone Encounter (Signed)
Needs OV with labs and UDS

## 2016-07-26 NOTE — Telephone Encounter (Signed)
Called pt gave MD response. Made appt for 11/14 @ 8:15...Johny Chess

## 2016-08-02 ENCOUNTER — Encounter: Payer: Self-pay | Admitting: Internal Medicine

## 2016-08-02 ENCOUNTER — Other Ambulatory Visit (INDEPENDENT_AMBULATORY_CARE_PROVIDER_SITE_OTHER): Payer: Commercial Managed Care - HMO

## 2016-08-02 ENCOUNTER — Ambulatory Visit (INDEPENDENT_AMBULATORY_CARE_PROVIDER_SITE_OTHER): Payer: Commercial Managed Care - HMO | Admitting: Internal Medicine

## 2016-08-02 VITALS — BP 136/70 | HR 66 | Temp 98.3°F | Ht 72.0 in | Wt >= 6400 oz

## 2016-08-02 DIAGNOSIS — E531 Pyridoxine deficiency: Secondary | ICD-10-CM | POA: Diagnosis not present

## 2016-08-02 DIAGNOSIS — E1142 Type 2 diabetes mellitus with diabetic polyneuropathy: Secondary | ICD-10-CM

## 2016-08-02 DIAGNOSIS — E039 Hypothyroidism, unspecified: Secondary | ICD-10-CM

## 2016-08-02 DIAGNOSIS — Z9884 Bariatric surgery status: Secondary | ICD-10-CM | POA: Diagnosis not present

## 2016-08-02 DIAGNOSIS — Z0000 Encounter for general adult medical examination without abnormal findings: Secondary | ICD-10-CM | POA: Diagnosis not present

## 2016-08-02 DIAGNOSIS — E785 Hyperlipidemia, unspecified: Secondary | ICD-10-CM

## 2016-08-02 DIAGNOSIS — E118 Type 2 diabetes mellitus with unspecified complications: Secondary | ICD-10-CM | POA: Diagnosis not present

## 2016-08-02 DIAGNOSIS — I10 Essential (primary) hypertension: Secondary | ICD-10-CM | POA: Diagnosis not present

## 2016-08-02 DIAGNOSIS — I5032 Chronic diastolic (congestive) heart failure: Secondary | ICD-10-CM

## 2016-08-02 DIAGNOSIS — D539 Nutritional anemia, unspecified: Secondary | ICD-10-CM

## 2016-08-02 DIAGNOSIS — N400 Enlarged prostate without lower urinary tract symptoms: Secondary | ICD-10-CM

## 2016-08-02 DIAGNOSIS — G4733 Obstructive sleep apnea (adult) (pediatric): Secondary | ICD-10-CM

## 2016-08-02 DIAGNOSIS — E519 Thiamine deficiency, unspecified: Secondary | ICD-10-CM | POA: Insufficient documentation

## 2016-08-02 DIAGNOSIS — D508 Other iron deficiency anemias: Secondary | ICD-10-CM

## 2016-08-02 DIAGNOSIS — R1903 Right lower quadrant abdominal swelling, mass and lump: Secondary | ICD-10-CM

## 2016-08-02 LAB — COMPREHENSIVE METABOLIC PANEL
ALBUMIN: 3.7 g/dL (ref 3.5–5.2)
ALT: 21 U/L (ref 0–53)
AST: 21 U/L (ref 0–37)
Alkaline Phosphatase: 202 U/L — ABNORMAL HIGH (ref 39–117)
BUN: 25 mg/dL — ABNORMAL HIGH (ref 6–23)
CALCIUM: 9.3 mg/dL (ref 8.4–10.5)
CHLORIDE: 104 meq/L (ref 96–112)
CO2: 31 mEq/L (ref 19–32)
Creatinine, Ser: 1.43 mg/dL (ref 0.40–1.50)
GFR: 65.16 mL/min (ref >60.00)
Glucose, Bld: 140 mg/dL — ABNORMAL HIGH (ref 70–99)
POTASSIUM: 4.6 meq/L (ref 3.5–5.1)
SODIUM: 144 meq/L (ref 135–145)
Total Bilirubin: 0.4 mg/dL (ref 0.2–1.2)
Total Protein: 7.7 g/dL (ref 6.0–8.3)

## 2016-08-02 LAB — TSH: TSH: 2.52 u[IU]/mL (ref 0.35–4.50)

## 2016-08-02 LAB — CBC WITH DIFFERENTIAL/PLATELET
BASOS PCT: 0.3 % (ref 0.0–3.0)
Basophils Absolute: 0 10*3/uL (ref 0.0–0.1)
EOS PCT: 5 % (ref 0.0–5.0)
Eosinophils Absolute: 0.3 10*3/uL (ref 0.0–0.7)
HEMATOCRIT: 32.4 % — AB (ref 39.0–52.0)
HEMOGLOBIN: 10.5 g/dL — AB (ref 13.0–17.0)
LYMPHS PCT: 23.1 % (ref 12.0–46.0)
Lymphs Abs: 1.5 10*3/uL (ref 0.7–4.0)
MCHC: 32.4 g/dL (ref 30.0–36.0)
MCV: 85.8 fl (ref 78.0–100.0)
MONOS PCT: 8.5 % (ref 3.0–12.0)
Monocytes Absolute: 0.6 10*3/uL (ref 0.1–1.0)
Neutro Abs: 4.2 10*3/uL (ref 1.4–7.7)
Neutrophils Relative %: 63.1 % (ref 43.0–77.0)
Platelets: 228 10*3/uL (ref 150.0–400.0)
RBC: 3.78 Mil/uL — ABNORMAL LOW (ref 4.22–5.81)
RDW: 14.7 % (ref 11.5–15.5)
WBC: 6.7 10*3/uL (ref 4.0–10.5)

## 2016-08-02 LAB — LIPID PANEL
CHOLESTEROL: 165 mg/dL (ref 0–200)
HDL: 36.5 mg/dL — ABNORMAL LOW (ref >39.00)
LDL CALC: 111 mg/dL — AB (ref 0–99)
NonHDL: 128.13
Total CHOL/HDL Ratio: 5
Triglycerides: 87 mg/dL (ref 0.0–149.0)
VLDL: 17.4 mg/dL (ref 0.0–40.0)

## 2016-08-02 LAB — IBC PANEL
Iron: 57 ug/dL (ref 42–165)
Saturation Ratios: 19.7 % — ABNORMAL LOW (ref 20.0–50.0)
Transferrin: 207 mg/dL — ABNORMAL LOW (ref 212.0–360.0)

## 2016-08-02 LAB — PSA: PSA: 1.29 ng/mL (ref 0.10–4.00)

## 2016-08-02 LAB — FERRITIN: Ferritin: 313.2 ng/mL (ref 22.0–322.0)

## 2016-08-02 LAB — VITAMIN D 25 HYDROXY (VIT D DEFICIENCY, FRACTURES): VITD: 32.72 ng/mL (ref 30.00–100.00)

## 2016-08-02 LAB — VITAMIN B12: Vitamin B-12: 376 pg/mL (ref 211–911)

## 2016-08-02 LAB — HEMOGLOBIN A1C: HEMOGLOBIN A1C: 9.4 % — AB (ref 4.6–6.5)

## 2016-08-02 LAB — FOLATE: Folate: 10.8 ng/mL (ref >5.9)

## 2016-08-02 MED ORDER — HYDROCODONE-ACETAMINOPHEN 10-325 MG PO TABS: 1.0000 | ORAL_TABLET | Freq: Three times a day (TID) | ORAL | 0 refills | Status: DC | PRN

## 2016-08-02 NOTE — Progress Notes (Signed)
Pre visit review using our clinic review tool, if applicable. No additional management support is needed unless otherwise documented below in the visit note. 

## 2016-08-02 NOTE — Progress Notes (Signed)
Subjective:  Patient ID: [name], male    DOB: [birth year]  Age: 60 y.o. MRN: [id number]  CC: Annual Exam; Diabetes; Hypertension; Hyperlipidemia; and Congestive Heart Failure   HPI Jamile Sivils presents for an AWV/CPX.  He complains of a mass in his abdomen on the right beside his bellybutton. He feels like it showed up there about a year ago after he had a hernia repair but he feels like it's becoming larger and more painful. He also complains of worsening neuropathy pain in his feet and he wants a refill on pain medications. He complains of weight gain, DOE, edema, and fatigue. He denies any recent episodes of chest pain, hemoptysis, nausea, vomiting, or diarrhea.   Past Medical History:  Diagnosis Date  . Anemia   . CHF (congestive heart failure) (HCC)    Presumed diastolic. Echo (06/07) w.EF 45%, severe posterior HK, mild LV hypertrophy, No further work-up of abnormal echo was done.  . Colon cancer [facility]) 2007   s/p sigmoid colectomy  . Diabetes mellitus    Has Hx of diabetic foot ulcer & peripheral neuropathy  . History of PFTs 05/2009   Mild Obstructive defect  . HTN (hypertension)   . Hyperlipidemia   . Morbid obesity ([place])   . OSA on CPAP    Past Surgical History:  Procedure Laterality Date  . BARIATRIC SURGERY  12/2009   Lap Band/At [facility]  . COLONOSCOPY  multiple  . COLONOSCOPY WITH PROPOFOL N/A 11/26/2012   Procedure: COLONOSCOPY WITH PROPOFOL;  Surgeon: Gatha Mayer, MD;  [facility]: [facility];  Service: Endoscopy;  Laterality: N/A;  may need pre appt. with anesthesia due to morbid obesity  . [place]   '80/Right  '84/Left  . EYE SURGERY    . HERNIA REPAIR    . HIP SURGERY     bilateral  . INCISIONAL HERNIA REPAIR N/A 12/18/2013   Procedure:  REPAIR OF INCARCERATED INCISIONAL HERNIA;  Surgeon: Rolm Bookbinder, MD;  Location: [place];  Service: General;  Laterality: N/A;  . INSERTION OF MESH N/A 12/18/2013   Procedure: INSERTION OF  MESH;  Surgeon: Rolm Bookbinder, MD;  Location: [place];  Service: General;  Laterality: N/A;  . Sigmoid Colectomy  10/2005   [facility]  . TONSILLECTOMY      reports that he has never smoked. He has never used smokeless tobacco. He reports that he does not drink alcohol or use drugs. family history includes CAD in his mother; Congestive Heart Failure in his mother; Diabetes in his father and mother; Heart attack (age of onset: 74) in his brother; Heart failure in his brother; Hepatitis in his mother. Allergies  Allergen Reactions  . Shellfish Allergy Anaphylaxis  . Lisinopril Cough    Outpatient Medications Prior to Visit  Medication Sig Dispense Refill  . aspirin EC 325 MG tablet Take 325 mg by mouth every evening.     Marland Kitchen atorvastatin (LIPITOR) 40 MG tablet Take 1 tablet (40 mg total) by mouth daily. 90 tablet 3  . bisacodyl (DULCOLAX) 5 MG EC tablet Take 5 mg by mouth daily as needed for moderate constipation.    . bisoprolol (ZEBETA) 10 MG tablet TAKE 1 TABLET (10 MG TOTAL) BY MOUTH EVERY EVENING. 90 tablet 3  . cholecalciferol (VITAMIN D) 1000 UNITS tablet Take 1,000 Units by mouth daily.    . cycloSPORINE (RESTASIS) 0.05 % ophthalmic emulsion Place 1 drop into both eyes 2 (two) times daily. 0.4 mL 5  .  Diclofenac Sodium (PENNSAID) 1.5 % SOLN Place 0.2 mLs onto the skin 3 (three) times daily. 150 mL 11  . Empagliflozin-Metformin HCl (SYNJARDY) 01-999 MG TABS Take 1 tablet by mouth 2 (two) times daily. 60 tablet 5  . ferrous sulfate 325 (65 FE) MG tablet Take 1 tablet (325 mg total) by mouth 2 (two) times daily with a meal. Reported on 11/05/2015 180 tablet 1  . furosemide (LASIX) 40 MG tablet TAKE 1 TABLET (40 MG TOTAL) BY MOUTH 2 TIMES DAILY. 180 tablet 3  . glucose blood (ONETOUCH VERIO) test strip Use to check blood sugar up to three times a day DX E10.29 300 each 1  . ibuprofen (ADVIL,MOTRIN) 200 MG tablet Take 200 mg by mouth every 6 (six) hours as needed for mild pain.    Marland Kitchen LEVEMIR  FLEXTOUCH 100 UNIT/ML Pen INJECT 2.4 MLS (240 UNITS TOTAL) INTO THE SKIN EVERY MORNING. 75 mL 5  . levothyroxine (SYNTHROID, LEVOTHROID) 50 MCG tablet TAKE 1 TABLET BY MOUTH DAILY BEFORE BREAKFAST. 90 tablet 2  . losartan (COZAAR) 25 MG tablet TAKE 1 TABLET (25 MG TOTAL) BY MOUTH DAILY. 90 tablet 3  . Multiple Vitamins-Minerals (ZINC PO) Take by mouth. Reported on 01/19/2016    . ULTICARE SHORT PEN NEEDLES 31G X 8 MM MISC Dx E10.9 Use one pen needle once daily with insulin 100 each 11  . vitamin C (ASCORBIC ACID) 500 MG tablet Take 1,000 mg by mouth 2 (two) times daily. Reported on 01/19/2016    . vitamin E 200 UNIT capsule Take 400 Units by mouth every morning. Reported on 01/19/2016    . zinc gluconate 50 MG tablet Take 50 mg by mouth daily. Reported on 01/19/2016    . HYDROcodone-acetaminophen (NORCO) 10-325 MG tablet Take 1 tablet by mouth every 8 (eight) hours as needed. 90 tablet 0  . polyethylene glycol powder (GLYCOLAX/MIRALAX) powder Take 17 g by mouth daily as needed for mild constipation. 255 g 11  . doxycycline (VIBRA-TABS) 100 MG tablet Take 1 tablet (100 mg total) by mouth 2 (two) times daily. 20 tablet 0  . ULTICARE SHORT PEN NEEDLES 31G X 8 MM MISC Use with insulin pen daily E11.9 100 each 3   No facility-administered medications prior to visit.     ROS Review of Systems  Constitutional: Positive for fatigue and unexpected weight change. Negative for activity change, appetite change, chills, diaphoresis and fever.  HENT: Negative.  Negative for trouble swallowing.   Eyes: Negative for photophobia and visual disturbance.  Respiratory: Positive for apnea and shortness of breath. Negative for cough, choking, chest tightness, wheezing and stridor.   Cardiovascular: Positive for leg swelling. Negative for chest pain and palpitations.  Gastrointestinal: Positive for abdominal pain. Negative for blood in stool, constipation, diarrhea, nausea and vomiting.  Endocrine: Negative for cold  intolerance, heat intolerance, polydipsia, polyphagia and polyuria.  Genitourinary: Negative.  Negative for difficulty urinating.  Musculoskeletal: Positive for arthralgias and myalgias. Negative for back pain and neck pain.  Skin: Negative.  Negative for color change and rash.  Allergic/Immunologic: Negative.   Neurological: Negative.  Negative for dizziness, weakness, light-headedness and headaches.  Hematological: Negative.  Negative for adenopathy. Does not bruise/bleed easily.  Psychiatric/Behavioral: Negative.     Objective:  BP 136/70 (BP Location: Left Arm, Patient Position: Sitting, Cuff Size: Large)   Pulse 66   Temp 98.3 F (36.8 C) (Oral)   Ht 6' (1.829 m)   Wt (!) 489 lb 8 oz (222 kg)  SpO2 96%   BMI 66.39 kg/m   BP Readings from Last 3 Encounters:  08/02/16 136/70  05/25/16 127/71  02/12/16 (!) 144/82    Wt Readings from Last 3 Encounters:  08/02/16 (!) 489 lb 8 oz (222 kg)  02/12/16 (!) 484 lb (219.5 kg)  02/03/16 (!) 472 lb (214.1 kg)    Physical Exam  Constitutional: He is oriented to person, place, and time. He appears well-developed and well-nourished.  Non-toxic appearance. He has a sickly appearance. He appears ill. No distress.  Morbidly obese Foul-smelling Unable to change clothes or get on the table to be thoroughly examined  HENT:  Mouth/Throat: Oropharynx is clear and moist. No oropharyngeal exudate.  Eyes: Conjunctivae are normal. Right eye exhibits no discharge. Left eye exhibits no discharge. No scleral icterus.  Neck: Normal range of motion. Neck supple. No JVD present. No tracheal deviation present. No thyromegaly present.  Cardiovascular: Normal rate, regular rhythm, normal heart sounds and intact distal pulses.  Exam reveals no gallop and no friction rub.   No murmur heard. Pulmonary/Chest: Effort normal and breath sounds normal. No stridor. No respiratory distress. He has no wheezes. He has no rales. He exhibits no tenderness.    Abdominal: Soft. Normal appearance and bowel sounds are normal. He exhibits no distension and no mass. There is no hepatosplenomegaly, splenomegaly or hepatomegaly. There is no tenderness. There is no rebound, no guarding and no CVA tenderness. No hernia. Hernia confirmed negative in the ventral area.    Musculoskeletal: Normal range of motion. He exhibits no edema, tenderness or deformity.  Lymphadenopathy:    He has no cervical adenopathy.  Neurological: He is alert and oriented to person, place, and time.  Skin: Skin is warm and dry. No rash noted. He is not diaphoretic. No erythema. No pallor.  Psychiatric: He has a normal mood and affect. His behavior is normal. Judgment and thought content normal.  Vitals reviewed.   Lab Results  Component Value Date   WBC 6.7 08/02/2016   HGB 10.5 (L) 08/02/2016   HCT 32.4 (L) 08/02/2016   PLT 228.0 08/02/2016   GLUCOSE 140 (H) 08/02/2016   CHOL 165 08/02/2016   TRIG 87.0 08/02/2016   HDL 36.50 (L) 08/02/2016   LDLDIRECT 136.8 12/12/2006   LDLCALC 111 (H) 08/02/2016   ALT 21 08/02/2016   AST 21 08/02/2016   NA 144 08/02/2016   K 4.6 08/02/2016   CL 104 08/02/2016   CREATININE 1.43 08/02/2016   BUN 25 (H) 08/02/2016   CO2 31 08/02/2016   TSH 2.52 08/02/2016   PSA 1.29 08/02/2016   HGBA1C 9.4 (H) 08/02/2016   MICROALBUR 5.5 (H) 12/08/2015    Dg Chest 2 View  Result Date: 11/26/2015 CLINICAL DATA:  Sore throat, congestion, cough for 6 days, hypertension, CHF, diabetes mellitus EXAM: CHEST  2 VIEW COMPARISON:  01/14/2015 FINDINGS: Upper normal heart size. Question minimally prominent central pulmonary arteries. Mediastinal contours normal. Mild bibasilar atelectasis. No acute infiltrate, pleural effusion or pneumothorax. Bones unremarkable. IMPRESSION: Bibasilar atelectasis. Electronically Signed   By: Lavonia Dana M.D.   On: 11/26/2015 17:55    Assessment & Plan:   Tonio was seen today for annual exam, diabetes, hypertension,  hyperlipidemia and congestive heart failure.  Diagnoses and all orders for this visit:  Essential hypertension- His blood pressure is well-controlled, electrolytes and renal function are stable. -     Comprehensive metabolic panel; Future  Type 2 diabetes mellitus with complication, without long-term current use of insulin (  [place])- his A1c is at 9.4%, blood sugars are not adequately well controlled, I've asked him to be seen by endocrinology and diabetic education -     Comprehensive metabolic panel; Future -     Hemoglobin A1c; Future -     Amb Referral to Nutrition and Diabetic E -     Ambulatory referral to Endocrinology  Acquired hypothyroidism- his TSH is in the normal range, he will remain on the current dose of levothyroxine. -     TSH; Future  Bariatric surgery status- will check his labs to screen for vitamin deficiencies -     Vitamin B12; Future -     IBC panel; Future -     Folate; Future -     Ferritin; Future -     VITAMIN D 25 Hydroxy (Vit-D Deficiency, Fractures); Future -     Vitamin B6; Future -     Vitamin B1; Future -     Zinc; Future  Iron deficiency anemia secondary to inadequate dietary iron intake- improvement noted  Hyperlipidemia with target LDL less than 100- he has achieved his LDL goal and is doing well on the statin. -     Lipid panel; Future  Deficiency anemia -     CBC with Differential/Platelet; Future -     Vitamin B12; Future -     IBC panel; Future -     Folate; Future -     Ferritin; Future -     Vitamin B6; Future -     Vitamin B1; Future -     Zinc; Future  Abdominal wall mass of right lower quadrant -     Ambulatory referral to General Surgery  Chronic diastolic CHF (congestive heart failure) ([place]) -     Ambulatory referral to Cardiology  OSA (obstructive sleep apnea) -     Ambulatory referral to Pulmonology  Preventative health care  Benign prostatic hyperplasia without lower urinary tract symptoms -     PSA;  Future  Diabetic peripheral neuropathy (HCC) -     HYDROcodone-acetaminophen (NORCO) 10-325 MG tablet; Take 1 tablet by mouth every 8 (eight) hours as needed.   I have discontinued Mr. Olivero doxycycline. I am also having him maintain his vitamin C, vitamin E, aspirin EC, cycloSPORINE, Multiple Vitamins-Minerals (ZINC PO), ULTICARE SHORT PEN NEEDLES, Diclofenac Sodium, zinc gluconate, cholecalciferol, bisoprolol, furosemide, losartan, levothyroxine, ibuprofen, bisacodyl, glucose blood, Empagliflozin-Metformin HCl, ferrous sulfate, atorvastatin, LEVEMIR FLEXTOUCH, and HYDROcodone-acetaminophen.  Meds ordered this encounter  Medications  . HYDROcodone-acetaminophen (NORCO) 10-325 MG tablet    Sig: Take 1 tablet by mouth every 8 (eight) hours as needed.    Dispense:  90 tablet    Refill:  0   See AVS for instructions about healthy living and anticipatory guidance.  Follow-up: Return in about 3 months (around 11/02/2016).  Scarlette Calico, MD

## 2016-08-02 NOTE — Patient Instructions (Signed)

## 2016-08-04 ENCOUNTER — Other Ambulatory Visit: Payer: Self-pay | Admitting: Internal Medicine

## 2016-08-04 DIAGNOSIS — I10 Essential (primary) hypertension: Secondary | ICD-10-CM

## 2016-08-04 DIAGNOSIS — I5032 Chronic diastolic (congestive) heart failure: Secondary | ICD-10-CM

## 2016-08-05 ENCOUNTER — Encounter: Payer: Self-pay | Admitting: Nurse Practitioner

## 2016-08-05 ENCOUNTER — Telehealth: Payer: Self-pay | Admitting: Internal Medicine

## 2016-08-05 LAB — ZINC: ZINC: 73 ug/dL (ref 60–130)

## 2016-08-05 LAB — VITAMIN B6: VITAMIN B6: 4.9 ng/mL (ref 2.1–21.7)

## 2016-08-05 NOTE — Telephone Encounter (Signed)
Patient is requesting to be referred to Dr. Midge Aver for eye exam.

## 2016-08-05 NOTE — Telephone Encounter (Signed)
Can you tell if his insurance requires a referral to an ophthalmologist

## 2016-08-05 NOTE — Telephone Encounter (Signed)
Insurance does require referral. Their office is closed this afternoon. I will call [facility] Monday to get referral done.

## 2016-08-08 ENCOUNTER — Encounter: Payer: Self-pay | Admitting: Internal Medicine

## 2016-08-08 ENCOUNTER — Encounter: Payer: Commercial Managed Care - HMO | Admitting: Nurse Practitioner

## 2016-08-08 ENCOUNTER — Encounter: Payer: Self-pay | Admitting: Nurse Practitioner

## 2016-08-08 NOTE — Telephone Encounter (Signed)
done

## 2016-08-08 NOTE — Patient Instructions (Signed)
We will be checking the following labs today -    Medication Instructions:    Continue with your current medicines.     Testing/Procedures To Be Arranged:  N/A  Follow-Up:   See     Other Special Instructions:   N/A    If you need a refill on your cardiac medications before your next appointment, please call your pharmacy.   Call the [facility] office at [phone] if you have any questions, problems or concerns.      

## 2016-08-08 NOTE — Progress Notes (Signed)
CARDIOLOGY OFFICE NOTE  Date:  08/08/2016    [name] Date of Birth: [birth year] Medical Record #[id number]  PCP:  [name] Calico, MD  Cardiologist:  [name]  Chief Complaint  Patient presents with  . Cardiomyopathy    Work in visit - seen for Dr. Aundra [name]    History of Present Illness: [name] is a 60 y.o. male who presents today for an over 2 year check. Seen for Dr. Aundra [name].   He has a history of massive obesity, diabetes, & diastolic CHF.  He has limited mobility due to his size and uses a rolling walker.    He has not been seen since August of 2015 - he had had a ventral hernia repair that was complicated by post-op infection.  He was still having issues with this at his last visit here.   He had stopped lisinopril due to cough and did not start anything to replace it.  BP was running high. ARB was started. His statin was changed.     Comes in today. Here with    Past Medical History: 1. Diabetes mellitus.  Has history of diabetic foot ulcer and diabetic peripheral neuropathy. .  2. Hyperlipidemia 3. Hypertension: ACEI cough.  4. Colon cancer: s/p sigmoid colectomy in 2007 5. Morbid obesity s/p lap band procedure.  6. Anemia 7. Obstructive Sleep Apnea: Uses CPAP 8. CHF: Diastolic.  Echo (5/12): EF 50-55%, mild LV dilation, mild LVH, moderate (grade II) diastolic dysfunction.  Echo (9/14) with EF 50-55%, mild LVH, moderately dilated LV, RV poorly visualized.  9. PFTs (9/10): mild obstructive defect 10.  Dobutamine stress echo (9/10): EF 55%, mild LV hypertrophy, no significant valvular disease, no ischemia or infarction  11. CKD 12. Ventral hernia s/p repair   Past Medical History:  Diagnosis Date  . Anemia   . CHF (congestive heart failure) (HCC)    Presumed diastolic. Echo (06/07) w.EF 45%, severe posterior HK, mild LV hypertrophy, No further work-up of abnormal echo was done.  . Colon cancer [facility]) 2007   s/p sigmoid colectomy  . Diabetes  mellitus    Has Hx of diabetic foot ulcer & peripheral neuropathy  . History of PFTs 05/2009   Mild Obstructive defect  . HTN (hypertension)   . Hyperlipidemia   . Morbid obesity ([place])   . OSA on CPAP     Past Surgical History:  Procedure Laterality Date  . BARIATRIC SURGERY  12/2009   Lap Band/At [facility]  . COLONOSCOPY  multiple  . COLONOSCOPY WITH PROPOFOL N/A 11/26/2012   Procedure: COLONOSCOPY WITH PROPOFOL;  Surgeon: [name] Mayer, MD;  [facility];  Service: Endoscopy;  Laterality: N/A;  may need pre appt. with anesthesia due to morbid obesity  . [place]   '80/Right  '84/Left  . EYE SURGERY    . HERNIA REPAIR    . HIP SURGERY     bilateral  . INCISIONAL HERNIA REPAIR N/A 12/18/2013   Procedure:  REPAIR OF INCARCERATED INCISIONAL HERNIA;  Surgeon: [name] Bookbinder, MD;  Location: [place];  Service: General;  Laterality: N/A;  . INSERTION OF MESH N/A 12/18/2013   Procedure: INSERTION OF MESH;  Surgeon: [name] Bookbinder, MD;  Location: [place];  Service: General;  Laterality: N/A;  . Sigmoid Colectomy  10/2005   [place]  . TONSILLECTOMY       Medications: Current Outpatient Prescriptions  Medication Sig Dispense Refill  . aspirin EC 325 MG tablet  Take 325 mg by mouth every evening.     Marland Kitchen atorvastatin (LIPITOR) 40 MG tablet Take 1 tablet (40 mg total) by mouth daily. 90 tablet 3  . bisacodyl (DULCOLAX) 5 MG EC tablet Take 5 mg by mouth daily as needed for moderate constipation.    . bisoprolol (ZEBETA) 10 MG tablet TAKE 1 TABLET (10 MG TOTAL) BY MOUTH EVERY EVENING. 90 tablet 3  . cholecalciferol (VITAMIN D) 1000 UNITS tablet Take 1,000 Units by mouth daily.    . cycloSPORINE (RESTASIS) 0.05 % ophthalmic emulsion Place 1 drop into both eyes 2 (two) times daily. 0.4 mL 5  . Diclofenac Sodium (PENNSAID) 1.5 % SOLN Place 0.2 mLs onto the skin 3 (three) times daily. 150 mL 11  . Empagliflozin-Metformin HCl (SYNJARDY) 01-999 MG TABS Take 1 tablet by  mouth 2 (two) times daily. 60 tablet 5  . ferrous sulfate 325 (65 FE) MG tablet Take 1 tablet (325 mg total) by mouth 2 (two) times daily with a meal. Reported on 11/05/2015 180 tablet 1  . furosemide (LASIX) 40 MG tablet TAKE 1 TABLET (40 MG TOTAL) BY MOUTH 2 TIMES DAILY. 180 tablet 3  . glucose blood (ONETOUCH VERIO) test strip Use to check blood sugar up to three times a day DX E10.29 300 each 1  . HYDROcodone-acetaminophen (NORCO) 10-325 MG tablet Take 1 tablet by mouth every 8 (eight) hours as needed. 90 tablet 0  . ibuprofen (ADVIL,MOTRIN) 200 MG tablet Take 200 mg by mouth every 6 (six) hours as needed for mild pain.    Marland Kitchen LEVEMIR FLEXTOUCH 100 UNIT/ML Pen INJECT 2.4 MLS (240 UNITS TOTAL) INTO THE SKIN EVERY MORNING. 75 mL 5  . levothyroxine (SYNTHROID, LEVOTHROID) 50 MCG tablet TAKE 1 TABLET BY MOUTH DAILY BEFORE BREAKFAST. 90 tablet 2  . losartan (COZAAR) 25 MG tablet TAKE 1 TABLET (25 MG TOTAL) BY MOUTH DAILY. 90 tablet 3  . Multiple Vitamins-Minerals (ZINC PO) Take by mouth. Reported on 01/19/2016    . polyethylene glycol powder (GLYCOLAX/MIRALAX) powder TAKE 17 GRAMS BY MOUTH DAILY AS NEEDED FOR MILD CONSTIPATION. 527 g 11  . ULTICARE SHORT PEN NEEDLES 31G X 8 MM MISC Dx E10.9 Use one pen needle once daily with insulin 100 each 11  . vitamin C (ASCORBIC ACID) 500 MG tablet Take 1,000 mg by mouth 2 (two) times daily. Reported on 01/19/2016    . vitamin E 200 UNIT capsule Take 400 Units by mouth every morning. Reported on 01/19/2016    . zinc gluconate 50 MG tablet Take 50 mg by mouth daily. Reported on 01/19/2016     No current facility-administered medications for this visit.     Allergies: Allergies  Allergen Reactions  . Shellfish Allergy Anaphylaxis  . Lisinopril Cough    Social History: The patient  reports that he has never smoked. He has never used smokeless tobacco. He reports that he does not drink alcohol or use drugs.   Family History: The patient's family history includes  CAD in his mother; Congestive Heart Failure in his mother; Diabetes in his father and mother; Heart attack (age of onset: 59) in his brother; Heart failure in his brother; Hepatitis in his mother.   Review of Systems: Please see the history of present illness.   Otherwise, the review of systems is positive for none.   All other systems are reviewed and negative.   Physical Exam: VS:  There were no vitals taken for this visit. Marland Kitchen  BMI There is no  height or weight on file to calculate BMI.  Wt Readings from Last 3 Encounters:  08/02/16 (!) 489 lb 8 oz (222 kg)  02/12/16 (!) 484 lb (219.5 kg)  02/03/16 (!) 472 lb (214.1 kg)    General: Pleasant. Well developed, well nourished and in no acute distress.   HEENT: Normal.  Neck: Supple, no JVD, carotid bruits, or masses noted.  Cardiac: Regular rate and rhythm. No murmurs, rubs, or gallops. No edema.  Respiratory:  Lungs are clear to auscultation bilaterally with normal work of breathing.  GI: Soft and nontender.  MS: No deformity or atrophy. Gait and ROM intact.  Skin: Warm and dry. Color is normal.  Neuro:  Strength and sensation are intact and no gross focal deficits noted.  Psych: Alert, appropriate and with normal affect.   LABORATORY DATA:  EKG:  EKG is ordered today. This demonstrates .  Lab Results  Component Value Date   WBC 6.7 08/02/2016   HGB 10.5 (L) 08/02/2016   HCT 32.4 (L) 08/02/2016   PLT 228.0 08/02/2016   GLUCOSE 140 (H) 08/02/2016   CHOL 165 08/02/2016   TRIG 87.0 08/02/2016   HDL 36.50 (L) 08/02/2016   LDLDIRECT 136.8 12/12/2006   LDLCALC 111 (H) 08/02/2016   ALT 21 08/02/2016   AST 21 08/02/2016   NA 144 08/02/2016   K 4.6 08/02/2016   CL 104 08/02/2016   CREATININE 1.43 08/02/2016   BUN 25 (H) 08/02/2016   CO2 31 08/02/2016   TSH 2.52 08/02/2016   PSA 1.29 08/02/2016   HGBA1C 9.4 (H) 08/02/2016   MICROALBUR 5.5 (H) 12/08/2015    BNP (last 3 results) No results for input(s): BNP in the last 8760  hours.  ProBNP (last 3 results) No results for input(s): PROBNP in the last 8760 hours.   Other Studies Reviewed Today:  Echo Study Conclusions from 2014  - Left ventricle: The cavity size was moderately dilated. Wall thickness was increased in a pattern of mild LVH. Systolic function was normal. The estimated ejection fraction was in the range of 50% to 55%. Wall motion was normal; there were no regional wall motion abnormalities. Doppler parameters are consistent with abnormal left ventricular relaxation (grade 1 diastolic dysfunction). - Left atrium: The atrium was mildly dilated.  Assessment/Plan: 1. Chronic diastolic CHF: Exam is difficult for volume, but he does not appear markedly volume overloaded.  Continue Lasix 80 mg daily.   2. Morbid Obesity: Weight loss is imperative but will be difficult given his limited mobility. Unfortunately, this is the crux of his issues.   3. HTN: BP   4. Hyperlipidemia: recent lipids noted  5. Anemia - would defer to PCP  6. Uncontrolled DM  Current medicines are reviewed with the patient today.  The patient does not have concerns regarding medicines other than what has been noted above.  The following changes have been made:  See above.  Labs/ tests ordered today include:    Orders Placed This Encounter  Procedures  . EKG 12-Lead     Disposition:   FU with me in one year.   Patient is agreeable to this plan and will call if any problems develop in the interim.   Signed: Burtis Junes, RN, ANP-C 08/08/2016 8:14 AM  [facility] [address] [place], [place]  [address] Phone: [phone] Fax: [phone]         This encounter was created in error - please disregard.

## 2016-08-08 NOTE — Assessment & Plan Note (Signed)

## 2016-08-09 ENCOUNTER — Encounter: Payer: Self-pay | Admitting: Nurse Practitioner

## 2016-08-12 LAB — VITAMIN B1: Vitamin B1 (Thiamine): 8 nmol/L (ref 8–30)

## 2016-08-14 ENCOUNTER — Other Ambulatory Visit: Payer: Self-pay | Admitting: Internal Medicine

## 2016-08-14 DIAGNOSIS — E519 Thiamine deficiency, unspecified: Secondary | ICD-10-CM

## 2016-08-14 MED ORDER — VITAMIN B-1 100 MG PO TABS: 100.0000 mg | ORAL_TABLET | Freq: Every day | ORAL | 3 refills | Status: DC

## 2016-08-17 ENCOUNTER — Ambulatory Visit: Payer: Commercial Managed Care - HMO | Admitting: Podiatry

## 2016-08-19 DIAGNOSIS — G4733 Obstructive sleep apnea (adult) (pediatric): Secondary | ICD-10-CM | POA: Diagnosis not present

## 2016-08-26 ENCOUNTER — Encounter: Payer: Self-pay | Admitting: Cardiology

## 2016-08-26 ENCOUNTER — Ambulatory Visit (INDEPENDENT_AMBULATORY_CARE_PROVIDER_SITE_OTHER): Payer: Commercial Managed Care - HMO | Admitting: Cardiology

## 2016-08-26 VITALS — BP 130/80 | HR 76 | Ht 72.0 in | Wt >= 6400 oz

## 2016-08-26 DIAGNOSIS — I10 Essential (primary) hypertension: Secondary | ICD-10-CM | POA: Diagnosis not present

## 2016-08-26 DIAGNOSIS — I5032 Chronic diastolic (congestive) heart failure: Secondary | ICD-10-CM | POA: Diagnosis not present

## 2016-08-26 MED ORDER — FUROSEMIDE 40 MG PO TABS: 40.0000 mg | ORAL_TABLET | Freq: Two times a day (BID) | ORAL | 1 refills | Status: DC

## 2016-08-26 MED ORDER — POTASSIUM CHLORIDE CRYS ER 20 MEQ PO TBCR: 20.0000 meq | EXTENDED_RELEASE_TABLET | Freq: Every day | ORAL | 1 refills | Status: DC

## 2016-08-26 NOTE — Patient Instructions (Signed)
Medication Instructions:  Increase lasix (furosemide) to 40mg  two times a day.  Start KCL (potassium) 20 mEq daily  Labwork: BMET in about 10 days.  Testing/Procedures: None   Follow-Up: Your physician wants you to follow-up in: 6 months with Dr Aundra Dubin in the [facility] [phone] (June 2018).  You will receive a reminder letter in the mail two months in advance. If you don't receive a letter, please call our office to schedule the follow-up appointment.        If you need a refill on your cardiac medications before your next appointment, please call your pharmacy.

## 2016-08-27 NOTE — Progress Notes (Signed)
Patient ID: [name], male   DOB: Mar 28, 1956, 60 y.o.   MRN: [id number] PCP: Dr. [name]  60 yo with history of morbid obesity, diabetes, diastolic CHF presents for cardiology followup.  Weight continues to rise.  He has limited mobility due to size and uses a rolling walker. He has stable dyspnea with walking more than 75 feet. Short of breath carrying any load.  He is taking Lasix 20 mg bid.  His legs are weak.  No falls.  No chest pain.       Labs (8/10): creatinine 1.1, LDL 94, HDL 34 Labs (8/11): K 4.8, creatinine 1.2, LDL 93, HDL 33 Labs (10/12): K 5.4, creatinine 1.5, LDL 95, HDL 42 Labs (8/14): LDL 115, HDL 42 Labs (9/14): BNP 68 Labs (10/14): K 4.5, creatinine 1.6 Labs (7/15): K 4.4, creatinine 1.3 Labs (8/15): LDL 136, HDL 37 Labs (11/17): K 4.6, creatinine 1.43, LDL 111, HDL 37  ECG:  NSR, inferior TWIs, anterolateral TW flattening  Allergies (verified):  1)  ! * Shellfish 2)  * Shellfish  Past Medical History: 1. Diabetes mellitus.  Has history of diabetic foot ulcer and diabetic peripheral neuropathy. .  2. Hyperlipidemia 3. Hypertension: ACEI cough.  4. Colon cancer: s/p sigmoid colectomy in 2007 5. Morbid obesity s/p lap band procedure.  6. Anemia 7. Obstructive Sleep Apnea: Uses CPAP 8. CHF: Diastolic.  Echo (5/12): EF 50-55%, mild LV dilation, mild LVH, moderate (grade II) diastolic dysfunction.  Echo (9/14) with EF 50-55%, mild LVH, moderately dilated LV, RV poorly visualized.  9. PFTs (9/10): mild obstructive defect 10.  Dobutamine stress echo (9/10): EF 55%, mild LV hypertrophy, no significant valvular disease, no ischemia or infarction  11. CKD 12. Ventral hernia s/p repair.   Family History: father- deceased @68 : Asbestosis mother - deceased @ 81: CHF, CAD, Hepatitis C Neg- colon or prostate cancer. Brother - died of heart failure and had MI at 51  Social History: HSG, 2 years of college Native of [place]. 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 Lives alone- owns home. Never Smoked Alcohol use-occasional.   Drug use-no Regular exercise-no  ROS: All systems reviewed and negative except as per HPI.    Current Outpatient Prescriptions  Medication Sig Dispense Refill  . aspirin EC 325 MG tablet Take 325 mg by mouth every evening.     Marland Kitchen atorvastatin (LIPITOR) 40 MG tablet Take 1 tablet (40 mg total) by mouth daily. 90 tablet 3  . bisacodyl (DULCOLAX) 5 MG EC tablet Take 5 mg by mouth daily as needed for moderate constipation.    . bisoprolol (ZEBETA) 10 MG tablet TAKE 1 TABLET (10 MG TOTAL) BY MOUTH EVERY EVENING. 90 tablet 3  . cholecalciferol (VITAMIN D) 1000 UNITS tablet Take 1,000 Units by mouth daily.    . cycloSPORINE (RESTASIS) 0.05 % ophthalmic emulsion Place 1 drop into both eyes 2 (two) times daily. 0.4 mL 5  . Diclofenac Sodium (PENNSAID) 1.5 % SOLN Place 0.2 mLs onto the skin 3 (three) times daily. 150 mL 11  . Empagliflozin-Metformin HCl (SYNJARDY) 01-999 MG TABS Take 1 tablet by mouth 2 (two) times daily. 60 tablet 5  . ferrous sulfate 325 (65 FE) MG tablet Take 1 tablet (325 mg total) by mouth 2 (two) times daily with a meal. Reported on 11/05/2015 180 tablet 1  . glucose blood (ONETOUCH VERIO) test strip Use to check blood sugar up to three times a day DX E10.29 300 each 1  .  HYDROcodone-acetaminophen (NORCO) 10-325 MG tablet Take 1 tablet by mouth every 8 (eight) hours as needed. 90 tablet 0  . ibuprofen (ADVIL,MOTRIN) 200 MG tablet Take 200 mg by mouth every 6 (six) hours as needed for mild pain.    Marland Kitchen LEVEMIR FLEXTOUCH 100 UNIT/ML Pen INJECT 2.4 MLS (240 UNITS TOTAL) INTO THE SKIN EVERY MORNING. 75 mL 5  . levothyroxine (SYNTHROID, LEVOTHROID) 50 MCG tablet TAKE 1 TABLET BY MOUTH DAILY BEFORE BREAKFAST. 90 tablet 2  . losartan (COZAAR) 25 MG tablet TAKE 1 TABLET (25 MG TOTAL) BY MOUTH DAILY. 90 tablet 3  . Multiple Vitamins-Minerals (ZINC PO) Take by mouth. Reported on 01/19/2016    .  polyethylene glycol powder (GLYCOLAX/MIRALAX) powder TAKE 17 GRAMS BY MOUTH DAILY AS NEEDED FOR MILD CONSTIPATION. 527 g 11  . thiamine (VITAMIN B-1) 100 MG tablet Take 1 tablet (100 mg total) by mouth daily. 90 tablet 3  . ULTICARE SHORT PEN NEEDLES 31G X 8 MM MISC Dx E10.9 Use one pen needle once daily with insulin 100 each 11  . vitamin C (ASCORBIC ACID) 500 MG tablet Take 1,000 mg by mouth 2 (two) times daily. Reported on 01/19/2016    . vitamin E 200 UNIT capsule Take 400 Units by mouth every morning. Reported on 01/19/2016    . zinc gluconate 50 MG tablet Take 50 mg by mouth daily. Reported on 01/19/2016    . furosemide (LASIX) 40 MG tablet Take 1 tablet (40 mg total) by mouth 2 (two) times daily. 180 tablet 1  . potassium chloride SA (K-DUR,KLOR-CON) 20 MEQ tablet Take 1 tablet (20 mEq total) by mouth daily. 90 tablet 1   No current facility-administered medications for this visit.     BP 130/80   Pulse 76   Ht 6' (1.829 m)   Wt (!) 489 lb (221.8 kg)   BMI 66.32 kg/m  General: NAD, morbidly obese.  Neck: Thick, JVP 8 cm, no thyromegaly or thyroid nodule.  Lungs: Clear to auscultation bilaterally with normal respiratory effort. CV: Nondisplaced PMI.  Heart irregular S1/S2, no S3/S4, 1/6 early SEM.  1+ edema to knees bilaterally.  No carotid bruit.  Normal pedal pulses.  Abdomen: Soft, nontender, no hepatosplenomegaly, no distention.  Neurologic: Alert and oriented x 3.  Psych: Normal affect. Extremities: No clubbing or cyanosis.   Assessment/Plan: 1. Chronic diastolic CHF: Exam is difficult for volume, probably mildly volume overloaded.  Weight is up. - Increase Lasix to 40 mg bid. BMET in 10 days.  - Add KCl 20 daily.  2. Obesity: Weight loss is imperative but will be difficult given his limited mobility.  Suggested that he go back to walking in a pool.  3. HTN: BP controlled.  4. Hyperlipidemia: Lipids acceptable on atorvastatin.   Followup in 6 months at CHF clinic.   [name] 08/27/2016

## 2016-08-28 NOTE — Progress Notes (Signed)
Subjective:    Patient ID: [name], male    DOB: 1955-09-28, 60 y.o.   MRN: [id number]  HPI  Pt returns for f/u of diabetes mellitus: DM type: Insulin-requiring type 2 Dx'ed: 6213 Complications: polyneuropathy, proliferative retinopathy, and renal insufficiency Therapy: insulin since 2013 DKA: never Severe hypoglycemia: never Pancreatitis: never Other: he had gastric banding surgery in 2008, at [place]; he lost 200 lbs, but has since regained much of that; pt says he wasn't accepted as a bariatric patient at surgery here in [place], and he does not wish to go back to [facility]; he takes QD insulin, due to noncompliance.  Interval history: no cbg record, but states cbg's are in the high-100's.  pt states he feels well in general.  He has gained more weight.   Past Medical History:  Diagnosis Date  . Anemia   . CHF (congestive heart failure) (HCC)    Presumed diastolic. Echo (06/07) w.EF 45%, severe posterior HK, mild LV hypertrophy, No further work-up of abnormal echo was done.  . Colon cancer [facility]) 2007   s/p sigmoid colectomy  . Diabetes mellitus    Has Hx of diabetic foot ulcer & peripheral neuropathy  . History of PFTs 05/2009   Mild Obstructive defect  . HTN (hypertension)   . Hyperlipidemia   . Morbid obesity ([place])   . OSA on CPAP     Past Surgical History:  Procedure Laterality Date  . BARIATRIC SURGERY  12/2009   Lap Band/At [facility]  . COLONOSCOPY  multiple  . COLONOSCOPY WITH PROPOFOL N/A 11/26/2012   Procedure: COLONOSCOPY WITH PROPOFOL;  Surgeon: Gatha Mayer, MD;  Location: [facility] END[facility];  Service: Endoscopy;  Laterality: N/A;  may need pre appt. with anesthesia due to morbid obesity  . [place]   '80/Right  '84/Left  . EYE SURGERY    . HERNIA REPAIR    . HIP SURGERY     bilateral  . INCISIONAL HERNIA REPAIR N/A 12/18/2013   Procedure:  REPAIR OF INCARCERATED INCISIONAL HERNIA;  Surgeon: Rolm Bookbinder, MD;  Location: [place];  Service:  General;  Laterality: N/A;  . INSERTION OF MESH N/A 12/18/2013   Procedure: INSERTION OF MESH;  Surgeon: Rolm Bookbinder, MD;  Location: [place];  Service: General;  Laterality: N/A;  . Sigmoid Colectomy  10/2005   [place]  . TONSILLECTOMY      Social History   Social History  . Marital status: Legally Separated    Spouse name: N/A  . Number of children: 0  . Years of education: N/A   Occupational History  . Food Industry    Social History Main Topics  . Smoking status: Never Smoker  . Smokeless tobacco: Never Used  . Alcohol use No     Comment: states he does not drink   . Drug use: No  . Sexual activity: Not Currently   Other Topics Concern  . 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   In new relationship (04/2009) - girlfriend helps taste for cooking business and helps monitor for ulcers.   Regular Exercise- No          Current Outpatient Prescriptions on File Prior to  Visit  Medication Sig Dispense Refill  . aspirin EC 325 MG tablet Take 325 mg by mouth every evening.     Marland Kitchen atorvastatin (LIPITOR) 40 MG tablet Take 1 tablet (40 mg total) by mouth daily. 90 tablet 3  . bisacodyl (DULCOLAX) 5 MG EC tablet Take 5 mg by mouth daily as needed for moderate constipation.    . bisoprolol (ZEBETA) 10 MG tablet TAKE 1 TABLET (10 MG TOTAL) BY MOUTH EVERY EVENING. 90 tablet 3  . cholecalciferol (VITAMIN D) 1000 UNITS tablet Take 1,000 Units by mouth daily.    . cycloSPORINE (RESTASIS) 0.05 % ophthalmic emulsion Place 1 drop into both eyes 2 (two) times daily. 0.4 mL 5  . Diclofenac Sodium (PENNSAID) 1.5 % SOLN Place 0.2 mLs onto the skin 3 (three) times daily. 150 mL 11  . Empagliflozin-Metformin HCl (SYNJARDY) 01-999 MG TABS Take 1 tablet by mouth 2 (two)  times daily. 60 tablet 5  . ferrous sulfate 325 (65 FE) MG tablet Take 1 tablet (325 mg total) by mouth 2 (two) times daily with a meal. Reported on 11/05/2015 180 tablet 1  . furosemide (LASIX) 40 MG tablet Take 1 tablet (40 mg total) by mouth 2 (two) times daily. 180 tablet 1  . glucose blood (ONETOUCH VERIO) test strip Use to check blood sugar up to three times a day DX E10.29 300 each 1  . HYDROcodone-acetaminophen (NORCO) 10-325 MG tablet Take 1 tablet by mouth every 8 (eight) hours as needed. 90 tablet 0  . ibuprofen (ADVIL,MOTRIN) 200 MG tablet Take 200 mg by mouth every 6 (six) hours as needed for mild pain.    Marland Kitchen levothyroxine (SYNTHROID, LEVOTHROID) 50 MCG tablet TAKE 1 TABLET BY MOUTH DAILY BEFORE BREAKFAST. 90 tablet 2  . losartan (COZAAR) 25 MG tablet TAKE 1 TABLET (25 MG TOTAL) BY MOUTH DAILY. 90 tablet 3  . Multiple Vitamins-Minerals (ZINC PO) Take by mouth. Reported on 01/19/2016    . polyethylene glycol powder (GLYCOLAX/MIRALAX) powder TAKE 17 GRAMS BY MOUTH DAILY AS NEEDED FOR MILD CONSTIPATION. 527 g 11  . potassium chloride SA (K-DUR,KLOR-CON) 20 MEQ tablet Take 1 tablet (20 mEq total) by mouth daily. 90 tablet 1  . thiamine (VITAMIN B-1) 100 MG tablet Take 1 tablet (100 mg total) by mouth daily. 90 tablet 3  . ULTICARE SHORT PEN NEEDLES 31G X 8 MM MISC Dx E10.9 Use one pen needle once daily with insulin 100 each 11  . vitamin C (ASCORBIC ACID) 500 MG tablet Take 1,000 mg by mouth 2 (two) times daily. Reported on 01/19/2016    . vitamin E 200 UNIT capsule Take 400 Units by mouth every morning. Reported on 01/19/2016    . zinc gluconate 50 MG tablet Take 50 mg by mouth daily. Reported on 01/19/2016     No current facility-administered medications on file prior to visit.     Allergies  Allergen Reactions  . Shellfish Allergy Anaphylaxis  . Lisinopril Cough    Family History  Problem Relation Age of Onset  . Hepatitis Mother     hepatitis C  . Diabetes Mother   . Congestive  Heart Failure Mother   . CAD Mother   . Heart attack Brother 78  . Diabetes Father   . Heart failure Brother   . Cancer Neg Hx     colon or prostate    BP 126/70   Pulse 73   Ht 6\' 1"  (1.854 m)   Wt (!) 481 lb (218.2 kg)  SpO2 93%   BMI 63.46 kg/m    Review of Systems He denies hypoglycemia.     Objective:   Physical Exam VITAL SIGNS:  See vs page GENERAL: no distress.  morbid obesity. In wheelchair Pulses: dorsalis pedis intact bilat.   MSK: no deformity of the feet CV: 2+ left leg edema (1+ on the right) Skin:  no ulcer on the feet.  normal color and temp on the feet. Neuro: sensation is intact to touch on the feet Ext: There is bilateral onychomycosis of the toenails.  The right great toe is absent  Lab Results  Component Value Date   HGBA1C 9.4 (H) 08/02/2016       Assessment & Plan:  Insulin-requiring type 2 DM, with renal insufficiency: he refuses to increase insulin.  We discussed risks.  Noncompliance with cbg recording and f/u ov's: this limits the rx of DM.  Obesity, persistent.  He needs bariatric f/u.   Patient is advised the following: Patient Instructions  check your blood sugar twice a day.  vary the time of day when you check, between before the 3 meals, and at bedtime.  also check if you have symptoms of your blood sugar being too high or too low.  please keep a record of the readings and bring it to your next appointment here.  please call us sooner if your blood sugar goes below 70, or if you have a lot of readings over 200.     Please continue the same insulin. Please see a surgery specialist.  you will receive a phone call, about a day and time for an appointment   Please come back for a follow-up appointment in 2 months.

## 2016-08-31 ENCOUNTER — Encounter: Payer: Self-pay | Admitting: Endocrinology

## 2016-08-31 ENCOUNTER — Ambulatory Visit (INDEPENDENT_AMBULATORY_CARE_PROVIDER_SITE_OTHER): Payer: Commercial Managed Care - HMO | Admitting: Endocrinology

## 2016-08-31 VITALS — BP 126/70 | HR 73 | Ht 73.0 in | Wt >= 6400 oz

## 2016-08-31 DIAGNOSIS — E118 Type 2 diabetes mellitus with unspecified complications: Secondary | ICD-10-CM | POA: Diagnosis not present

## 2016-08-31 DIAGNOSIS — Z794 Long term (current) use of insulin: Secondary | ICD-10-CM

## 2016-08-31 MED ORDER — INSULIN DETEMIR 100 UNIT/ML FLEXPEN: 240.0000 [IU] | PEN_INJECTOR | SUBCUTANEOUS | 5 refills | Status: DC

## 2016-08-31 NOTE — Patient Instructions (Addendum)
check your blood sugar twice a day.  vary the time of day when you check, between before the 3 meals, and at bedtime.  also check if you have symptoms of your blood sugar being too high or too low.  please keep a record of the readings and bring it to your next appointment here.  please call us sooner if your blood sugar goes below 70, or if you have a lot of readings over 200.     Please continue the same insulin. Please see a surgery specialist.  you will receive a phone call, about a day and time for an appointment   Please come back for a follow-up appointment in 2 months.

## 2016-09-01 ENCOUNTER — Ambulatory Visit: Payer: Commercial Managed Care - HMO | Admitting: *Deleted

## 2016-09-05 ENCOUNTER — Other Ambulatory Visit: Payer: Commercial Managed Care - HMO

## 2016-09-19 DIAGNOSIS — G4733 Obstructive sleep apnea (adult) (pediatric): Secondary | ICD-10-CM | POA: Diagnosis not present

## 2016-09-27 ENCOUNTER — Other Ambulatory Visit: Payer: Self-pay | Admitting: Internal Medicine

## 2016-09-28 ENCOUNTER — Other Ambulatory Visit: Payer: Self-pay | Admitting: Internal Medicine

## 2016-09-28 ENCOUNTER — Telehealth: Payer: Self-pay | Admitting: *Deleted

## 2016-09-28 DIAGNOSIS — E1142 Type 2 diabetes mellitus with diabetic polyneuropathy: Secondary | ICD-10-CM

## 2016-09-28 MED ORDER — HYDROCODONE-ACETAMINOPHEN 10-325 MG PO TABS: 1.0000 | ORAL_TABLET | Freq: Three times a day (TID) | ORAL | 0 refills | Status: DC | PRN

## 2016-09-28 NOTE — Telephone Encounter (Signed)
RX written 

## 2016-09-28 NOTE — Telephone Encounter (Signed)
Rec'd call pt requesting refill on his Hydrocodone.../lmb 

## 2016-09-29 NOTE — Telephone Encounter (Signed)
Notified pt rx ready for pick-up.../lmb 

## 2016-10-20 DIAGNOSIS — G4733 Obstructive sleep apnea (adult) (pediatric): Secondary | ICD-10-CM | POA: Diagnosis not present

## 2016-11-01 ENCOUNTER — Ambulatory Visit: Payer: Commercial Managed Care - HMO | Admitting: Endocrinology

## 2016-11-17 DIAGNOSIS — G4733 Obstructive sleep apnea (adult) (pediatric): Secondary | ICD-10-CM | POA: Diagnosis not present

## 2016-11-22 ENCOUNTER — Other Ambulatory Visit: Payer: Self-pay | Admitting: Internal Medicine

## 2016-11-22 DIAGNOSIS — D509 Iron deficiency anemia, unspecified: Secondary | ICD-10-CM

## 2016-11-30 ENCOUNTER — Other Ambulatory Visit: Payer: Self-pay | Admitting: Internal Medicine

## 2016-11-30 DIAGNOSIS — E1022 Type 1 diabetes mellitus with diabetic chronic kidney disease: Secondary | ICD-10-CM

## 2016-12-18 DIAGNOSIS — G4733 Obstructive sleep apnea (adult) (pediatric): Secondary | ICD-10-CM | POA: Diagnosis not present

## 2016-12-19 ENCOUNTER — Ambulatory Visit: Payer: Commercial Managed Care - HMO | Admitting: Internal Medicine

## 2016-12-22 ENCOUNTER — Encounter: Payer: Self-pay | Admitting: Internal Medicine

## 2016-12-22 ENCOUNTER — Ambulatory Visit (INDEPENDENT_AMBULATORY_CARE_PROVIDER_SITE_OTHER): Payer: Medicare HMO | Admitting: Internal Medicine

## 2016-12-22 ENCOUNTER — Other Ambulatory Visit (INDEPENDENT_AMBULATORY_CARE_PROVIDER_SITE_OTHER): Payer: Medicare HMO

## 2016-12-22 VITALS — BP 138/86 | HR 80 | Temp 98.1°F | Resp 16 | Ht 73.0 in | Wt >= 6400 oz

## 2016-12-22 DIAGNOSIS — Z9884 Bariatric surgery status: Secondary | ICD-10-CM | POA: Diagnosis not present

## 2016-12-22 DIAGNOSIS — E038 Other specified hypothyroidism: Secondary | ICD-10-CM | POA: Diagnosis not present

## 2016-12-22 DIAGNOSIS — E118 Type 2 diabetes mellitus with unspecified complications: Secondary | ICD-10-CM

## 2016-12-22 DIAGNOSIS — D539 Nutritional anemia, unspecified: Secondary | ICD-10-CM | POA: Insufficient documentation

## 2016-12-22 DIAGNOSIS — M160 Bilateral primary osteoarthritis of hip: Secondary | ICD-10-CM | POA: Diagnosis not present

## 2016-12-22 DIAGNOSIS — D508 Other iron deficiency anemias: Secondary | ICD-10-CM | POA: Diagnosis not present

## 2016-12-22 DIAGNOSIS — E1142 Type 2 diabetes mellitus with diabetic polyneuropathy: Secondary | ICD-10-CM | POA: Diagnosis not present

## 2016-12-22 DIAGNOSIS — R059 Cough, unspecified: Secondary | ICD-10-CM

## 2016-12-22 DIAGNOSIS — I10 Essential (primary) hypertension: Secondary | ICD-10-CM

## 2016-12-22 DIAGNOSIS — R05 Cough: Secondary | ICD-10-CM

## 2016-12-22 DIAGNOSIS — E519 Thiamine deficiency, unspecified: Secondary | ICD-10-CM

## 2016-12-22 DIAGNOSIS — E559 Vitamin D deficiency, unspecified: Secondary | ICD-10-CM

## 2016-12-22 DIAGNOSIS — Z794 Long term (current) use of insulin: Secondary | ICD-10-CM

## 2016-12-22 DIAGNOSIS — M159 Polyosteoarthritis, unspecified: Secondary | ICD-10-CM

## 2016-12-22 DIAGNOSIS — M150 Primary generalized (osteo)arthritis: Secondary | ICD-10-CM

## 2016-12-22 DIAGNOSIS — R2689 Other abnormalities of gait and mobility: Secondary | ICD-10-CM

## 2016-12-22 LAB — CBC WITH DIFFERENTIAL/PLATELET
BASOS PCT: 0.5 % (ref 0.0–3.0)
Basophils Absolute: 0 10*3/uL (ref 0.0–0.1)
EOS PCT: 4 % (ref 0.0–5.0)
Eosinophils Absolute: 0.2 10*3/uL (ref 0.0–0.7)
HCT: 32.6 % — ABNORMAL LOW (ref 39.0–52.0)
Hemoglobin: 10.5 g/dL — ABNORMAL LOW (ref 13.0–17.0)
LYMPHS ABS: 1.6 10*3/uL (ref 0.7–4.0)
Lymphocytes Relative: 26.4 % (ref 12.0–46.0)
MCHC: 32.2 g/dL (ref 30.0–36.0)
MCV: 86.6 fl (ref 78.0–100.0)
MONO ABS: 0.4 10*3/uL (ref 0.1–1.0)
MONOS PCT: 6.5 % (ref 3.0–12.0)
NEUTROS ABS: 3.8 10*3/uL (ref 1.4–7.7)
NEUTROS PCT: 62.6 % (ref 43.0–77.0)
PLATELETS: 231 10*3/uL (ref 150.0–400.0)
RBC: 3.76 Mil/uL — AB (ref 4.22–5.81)
RDW: 15.6 % — AB (ref 11.5–15.5)
WBC: 6.1 10*3/uL (ref 4.0–10.5)

## 2016-12-22 LAB — BASIC METABOLIC PANEL
BUN: 20 mg/dL (ref 6–23)
CO2: 28 meq/L (ref 19–32)
Calcium: 9.3 mg/dL (ref 8.4–10.5)
Chloride: 103 mEq/L (ref 96–112)
Creatinine, Ser: 1.35 mg/dL (ref 0.40–1.50)
GFR: 69.54 mL/min (ref >60.00)
GLUCOSE: 276 mg/dL — AB (ref 70–99)
Potassium: 4.9 mEq/L (ref 3.5–5.1)
Sodium: 136 mEq/L (ref 135–145)

## 2016-12-22 LAB — IBC PANEL
IRON: 43 ug/dL (ref 42–165)
SATURATION RATIOS: 15.6 % — AB (ref 20.0–50.0)
TRANSFERRIN: 197 mg/dL — AB (ref 212.0–360.0)

## 2016-12-22 LAB — VITAMIN B12: VITAMIN B 12: 422 pg/mL (ref 211–911)

## 2016-12-22 LAB — POCT EXHALED NITRIC OXIDE: FeNO level (ppb): 20

## 2016-12-22 LAB — VITAMIN D 25 HYDROXY (VIT D DEFICIENCY, FRACTURES): VITD: 26.27 ng/mL — ABNORMAL LOW (ref 30.00–100.00)

## 2016-12-22 LAB — HEMOGLOBIN A1C: Hgb A1c MFr Bld: 10.7 % — ABNORMAL HIGH (ref 4.6–6.5)

## 2016-12-22 LAB — FOLATE: Folate: 15.4 ng/mL (ref >5.9)

## 2016-12-22 LAB — TSH: TSH: 2.1 u[IU]/mL (ref 0.35–4.50)

## 2016-12-22 MED ORDER — EMPAGLIFLOZIN-METFORMIN HCL 5-1000 MG PO TABS: 1.0000 | ORAL_TABLET | Freq: Two times a day (BID) | ORAL | 5 refills | Status: DC

## 2016-12-22 MED ORDER — HYDROCODONE-ACETAMINOPHEN 10-325 MG PO TABS: 1.0000 | ORAL_TABLET | Freq: Three times a day (TID) | ORAL | 0 refills | Status: DC | PRN

## 2016-12-22 NOTE — Progress Notes (Signed)
Pre visit review using our clinic review tool, if applicable. No additional management support is needed unless otherwise documented below in the visit note. 

## 2016-12-22 NOTE — Patient Instructions (Signed)
Iron Deficiency Anemia, Adult Iron deficiency anemia is a condition in which the concentration of red blood cells or hemoglobin in the blood is below normal because of too little iron. Hemoglobin is a substance in red blood cells that carries oxygen to the body's tissues. When the concentration of red blood cells or hemoglobin is too low, not enough oxygen reaches these tissues. Iron deficiency anemia is usually long-lasting (chronic) and it develops over time. It may or may not cause symptoms. It is a common type of anemia. What are the causes? This condition may be caused by:  Not enough iron in the diet.  Blood loss caused by bleeding in the intestine.  Blood loss from a gastrointestinal condition like Crohn disease.  Frequent blood draws, such as from blood donation.  Abnormal absorption in the gut.  Heavy menstrual periods in women.  Cancers of the gastrointestinal system, such as colon cancer. What are the signs or symptoms? Symptoms of this condition may include:  Fatigue.  Headache.  Pale skin, lips, and nail beds.  Poor appetite.  Weakness.  Shortness of breath.  Dizziness.  Cold hands and feet.  Fast or irregular heartbeat.  Irritability. This is more common in severe anemia.  Rapid breathing. This is more common in severe anemia. Mild anemia may not cause any symptoms. How is this diagnosed? This condition is diagnosed based on:  Your medical history.  A physical exam.  Blood tests. You may have additional tests to find the underlying cause of your anemia, such as:  Testing for blood in the stool (fecal occult blood test).  A procedure to see inside your colon and rectum (colonoscopy).  A procedure to see inside your esophagus and stomach (endoscopy).  A test in which cells are removed from bone marrow (bone marrow aspiration) or fluid is removed from the bone marrow to be examined (biopsy). This is rarely needed. How is this treated? This  condition is treated by correcting the cause of your iron deficiency. Treatment may involve:  Adding iron-rich foods to your diet.  Taking iron supplements. If you are pregnant or breastfeeding, you may need to take extra iron because your normal diet usually does not provide the amount of iron that you need.  Increasing vitamin C intake. Vitamin C helps your body absorb iron. Your health care provider may recommend that you take iron supplements along with a glass of orange juice or a vitamin C supplement.  Medicines to make heavy menstrual flow lighter.  Surgery. You may need repeat blood tests to determine whether treatment is working. Depending on the underlying cause, the anemia should be corrected within 2 months of starting treatment. If the treatment does not seem to be working, you may need more testing. Follow these instructions at home: Medicines  Take over-the-counter and prescription medicines only as told by your health care provider. This includes iron supplements and vitamins.  If you cannot tolerate taking iron supplements by mouth, talk with your health care provider about taking them through a vein (intravenously) or an injection into a muscle.  For the best iron absorption, you should take iron supplements when your stomach is empty. If you cannot tolerate them on an empty stomach, you may need to take them with food.  Do not drink milk or take antacids at the same time as your iron supplements. Milk and antacids may interfere with iron absorption.  Iron supplements can cause constipation. To prevent constipation, include fiber in your diet as told   by your health care provider. A stool softener may also be recommended. Eating and drinking  Talk with your health care provider before changing your diet. He or she may recommend that you eat foods that contain a lot of iron, such as:  Liver.  Low-fat (lean) beef.  Breads and cereals that have iron added to them (are  fortified).  Eggs.  Dried fruit.  Dark green, leafy vegetables.  To help your body use the iron from iron-rich foods, eat those foods at the same time as fresh fruits and vegetables that are high in vitamin C. Foods that are high in vitamin C include:  Oranges.  Peppers.  Tomatoes.  Mangoes.  Drinkenoughfluid to keep your urine clear or pale yellow. General instructions  Return to your normal activities as told by your health care provider. Ask your health care provider what activities are safe for you.  Practice good hygiene. Anemia can make you more prone to illness and infection.  Keep all follow-up visits as told by your health care provider. This is important. Contact a health care provider if:  You feel nauseous or you vomit.  You feel weak.  You have unexplained sweating.  You develop symptoms of constipation, such as:  Having fewer than three bowel movements a week.  Straining to have a bowel movement.  Having stools that are hard, dry, or larger than normal.  Feeling full or bloated.  Pain in the lower abdomen.  Not feeling relief after having a bowel movement. Get help right away if:  You faint. If this happens, do not drive yourself to the hospital. Call your local emergency services (911 in the [place]  You have chest pain.  You have shortness of breath that:  Is severe.  Gets worse with physical activity.  You have a rapid heartbeat.  You become light-headed when getting up from a sitting or lying down position. This information is not intended to replace advice given to you by your health care provider. Make sure you discuss any questions you have with your health care provider. Document Released: 09/02/2000 Document Revised: 05/25/2016 Document Reviewed: 05/25/2016 Elsevier Interactive Patient Education  2017 [employer]  

## 2016-12-22 NOTE — Progress Notes (Addendum)
Subjective:  Patient ID: [name], male    DOB: 1956/02/13  Age: 61 y.o. MRN: [id number]  CC: Hypertension; Hypothyroidism; Diabetes; Anemia; and Cough   HPI [name] presents for f/up - He complains of chronic pain in his lower extremities over the knees. He wants a refill on Norco. He also complains of chronic fatigue, shortness of breath, occasional wheezing, and cough that's rarely productive of thick white phlegm. He does not smoke and has never had a history of asthma. His blood sugars have not been well controlled. He has not been taking the metformin/SGLT2 inhibitor combination. He has been using his insulin. His last set of lab work shows that he was mildly anemic and he is status post gastric bypass.  Patient suffers from Diabetic peripheral neuropathy, Osteoarthritis involving multiple joints, Lower extremity weakness bilaterally, Severe degenerative joint disease, BLE edema, poor posture stability and Gait instability which impairs their ability to perform daily activities like safe ambulation, preparing meals and grooming sufficiently in the home.   Patient has difficulty walking with the 4 wheeled walker currently used and also exhibits fatigue, deconditioning and SOB when exerted.  A walking cane, 4 wheeled walker or scooter will not resolve  issue with performing activities of daily living due to patients poor balance, lack of postural stability and weakness. Patient is willing and motivated to use the power mobility device in the home. Patient can safely control the [employer] in the home to aid in ADL's.   Outpatient Medications Prior to Visit  Medication Sig Dispense Refill  . aspirin EC 325 MG tablet Take 325 mg by mouth every evening.     Marland Kitchen atorvastatin (LIPITOR) 40 MG tablet Take 1 tablet (40 mg total) by mouth daily. 90 tablet 3  . bisacodyl (DULCOLAX) 5 MG EC tablet Take 5 mg by mouth daily as needed for moderate constipation.    . bisoprolol (ZEBETA) 10 MG  tablet TAKE 1 TABLET (10 MG TOTAL) BY MOUTH EVERY EVENING. 90 tablet 3  . cycloSPORINE (RESTASIS) 0.05 % ophthalmic emulsion Place 1 drop into both eyes 2 (two) times daily. 0.4 mL 5  . Diclofenac Sodium (PENNSAID) 1.5 % SOLN Place 0.2 mLs onto the skin 3 (three) times daily. 150 mL 11  . ferrous sulfate 325 (65 FE) MG tablet TAKE 1 TABLET BY MOUTH 2 TIMES DAILY WITH A MEAL. 180 tablet 2  . glucose blood (ONETOUCH VERIO) test strip Use to check blood sugar up to three times a day DX E10.29 300 each 1  . Insulin Detemir (LEVEMIR FLEXTOUCH) 100 UNIT/ML Pen Inject 240 Units into the skin every morning. 75 mL 5  . levothyroxine (SYNTHROID, LEVOTHROID) 50 MCG tablet TAKE 1 TABLET BY MOUTH DAILY BEFORE BREAKFAST. 90 tablet 3  . losartan (COZAAR) 25 MG tablet TAKE 1 TABLET (25 MG TOTAL) BY MOUTH DAILY. 90 tablet 3  . Multiple Vitamins-Minerals (ZINC PO) Take by mouth. Reported on 01/19/2016    . polyethylene glycol powder (GLYCOLAX/MIRALAX) powder TAKE 17 GRAMS BY MOUTH DAILY AS NEEDED FOR MILD CONSTIPATION. 527 g 11  . potassium chloride SA (K-DUR,KLOR-CON) 20 MEQ tablet Take 1 tablet (20 mEq total) by mouth daily. 90 tablet 1  . thiamine (VITAMIN B-1) 100 MG tablet Take 1 tablet (100 mg total) by mouth daily. 90 tablet 3  . ULTICARE SHORT PEN NEEDLES 31G X 8 MM MISC USE WITH INSULIN PEN DAILY. 100 each 3  . vitamin C (ASCORBIC ACID) 500 MG tablet Take 1,000 mg by mouth  2 (two) times daily. Reported on 01/19/2016    . vitamin E 200 UNIT capsule Take 400 Units by mouth every morning. Reported on 01/19/2016    . zinc gluconate 50 MG tablet Take 50 mg by mouth daily. Reported on 01/19/2016    . cholecalciferol (VITAMIN D) 1000 UNITS tablet Take 1,000 Units by mouth daily.    . Empagliflozin-Metformin HCl (SYNJARDY) 01-999 MG TABS Take 1 tablet by mouth 2 (two) times daily. 60 tablet 5  . HYDROcodone-acetaminophen (NORCO) 10-325 MG tablet Take 1 tablet by mouth every 8 (eight) hours as needed. 90 tablet 0  .  ibuprofen (ADVIL,MOTRIN) 200 MG tablet Take 200 mg by mouth every 6 (six) hours as needed for mild pain.    . furosemide (LASIX) 40 MG tablet Take 1 tablet (40 mg total) by mouth 2 (two) times daily. 180 tablet 1   No facility-administered medications prior to visit.     ROS Review of Systems  Constitutional: Positive for fatigue. Negative for activity change, appetite change, chills, fever and unexpected weight change.  HENT: Negative.  Negative for trouble swallowing.   Eyes: Negative for visual disturbance.  Respiratory: Positive for cough, shortness of breath and wheezing. Negative for choking, chest tightness and stridor.   Gastrointestinal: Negative for abdominal pain, constipation, diarrhea, nausea and vomiting.  Endocrine: Negative.  Negative for cold intolerance, heat intolerance, polydipsia, polyphagia and polyuria.  Genitourinary: Negative.  Negative for difficulty urinating.  Musculoskeletal: Positive for arthralgias. Negative for back pain, joint swelling, myalgias and neck pain.       Range of motion and strength in both upper extremities is normal.  Range of motion in both lower extremities at the hips and knees is limited to about 10-20. There is also diffuse weakness and muscular atrophy.  Skin: Negative.  Negative for pallor and rash.  Allergic/Immunologic: Negative.   Neurological: Negative.  Negative for dizziness, weakness, light-headedness and numbness.  Hematological: Negative for adenopathy. Does not bruise/bleed easily.  Psychiatric/Behavioral: Negative.     Objective:  BP 138/86 (BP Location: Left Arm, Patient Position: Sitting, Cuff Size: Large)   Pulse 80   Temp 98.1 F (36.7 C) (Oral)   Resp 16   Ht 6\' 1"  (1.854 m)   Wt (!) 486 lb 4 oz (220.6 kg)   SpO2 97%   BMI 64.15 kg/m   BP Readings from Last 3 Encounters:  12/22/16 138/86  08/31/16 126/70  08/26/16 130/80    Wt Readings from Last 3 Encounters:  12/22/16 (!) 486 lb 4 oz (220.6 kg)    08/31/16 (!) 481 lb (218.2 kg)  08/26/16 (!) 489 lb (221.8 kg)    Physical Exam  Constitutional: He is oriented to person, place, and time. No distress.  HENT:  Mouth/Throat: Oropharynx is clear and moist. No oropharyngeal exudate.  Eyes: Conjunctivae are normal. Right eye exhibits no discharge. Left eye exhibits no discharge. No scleral icterus.  Neck: Normal range of motion. Neck supple. No JVD present. No tracheal deviation present. No thyromegaly present.  Cardiovascular: Normal rate, regular rhythm, normal heart sounds and intact distal pulses.  Exam reveals no gallop and no friction rub.   No murmur heard. Pulmonary/Chest: Effort normal and breath sounds normal. No stridor. No respiratory distress. He has no wheezes. He has no rales. He exhibits no tenderness.  Abdominal: Soft. Bowel sounds are normal. He exhibits no distension and no mass. There is no tenderness. There is no rebound and no guarding.  Musculoskeletal: Normal range of motion.  He exhibits edema (trace edema in BLE). He exhibits no tenderness or deformity.  Severe DJD in both knees  Lymphadenopathy:    He has no cervical adenopathy.  Neurological: He is oriented to person, place, and time.  Skin: Skin is warm and dry. No rash noted. He is not diaphoretic. No erythema. No pallor.  Vitals reviewed.   Lab Results  Component Value Date   WBC 6.1 12/22/2016   HGB 10.5 (L) 12/22/2016   HCT 32.6 (L) 12/22/2016   PLT 231.0 12/22/2016   GLUCOSE 276 (H) 12/22/2016   CHOL 165 08/02/2016   TRIG 87.0 08/02/2016   HDL 36.50 (L) 08/02/2016   LDLDIRECT 136.8 12/12/2006   LDLCALC 111 (H) 08/02/2016   ALT 21 08/02/2016   AST 21 08/02/2016   NA 136 12/22/2016   K 4.9 12/22/2016   CL 103 12/22/2016   CREATININE 1.35 12/22/2016   BUN 20 12/22/2016   CO2 28 12/22/2016   TSH 2.10 12/22/2016   PSA 1.29 08/02/2016   HGBA1C 10.7 (H) 12/22/2016   MICROALBUR 5.5 (H) 12/08/2015    Dg Chest 2 View  Result Date:  11/26/2015 CLINICAL DATA:  Sore throat, congestion, cough for 6 days, hypertension, CHF, diabetes mellitus EXAM: CHEST  2 VIEW COMPARISON:  01/14/2015 FINDINGS: Upper normal heart size. Question minimally prominent central pulmonary arteries. Mediastinal contours normal. Mild bibasilar atelectasis. No acute infiltrate, pleural effusion or pneumothorax. Bones unremarkable. IMPRESSION: Bibasilar atelectasis. Electronically Signed   By: Lavonia Dana M.D.   On: 11/26/2015 17:55    Assessment & Plan:   Trumaine was seen today for hypertension, hypothyroidism, diabetes, anemia and cough.  Diagnoses and all orders for this visit:  Essential hypertension- his blood pressures well controlled, electrolytes and renal function are normal. -     Basic metabolic panel; Future  Other specified hypothyroidism- his TSH is in the normal range, we'll continue current dose of levothyroxine -     TSH; Future  Type 2 diabetes mellitus with complication, without long-term current use of insulin ([place])- his A1c is up to 10.7%, I've asked him to restart the metformin-SGLT2 inhibitor and to continue the insulin at the current dose, I've also asked him to follow-up with endocrinology -     Basic metabolic panel; Future -     Hemoglobin A1c; Future -     Empagliflozin-Metformin HCl (SYNJARDY) 01-999 MG TABS; Take 1 tablet by mouth 2 (two) times daily. -     Ambulatory referral to Endocrinology  Bariatric surgery status -     CBC with Differential/Platelet; Future -     Folate; Future -     Vitamin B12; Future -     Vitamin B6; Future -     Zinc; Future -     VITAMIN D 25 Hydroxy (Vit-D Deficiency, Fractures); Future -     Cholecalciferol 2000 units TABS; Take 1 tablet (2,000 Units total) by mouth daily.  Other iron deficiency anemia -     CBC with Differential/Platelet; Future -     IBC panel; Future  Thiamine deficiency -     CBC with Differential/Platelet; Future -     Vitamin B1; Future  Deficiency anemia -      CBC with Differential/Platelet; Future -     Folate; Future -     Vitamin B12; Future -     Vitamin B6; Future -     Zinc; Future  Cough- his FENO score is low indicating no eosinophilic inflammation, he probably has  chronic bronchitis but he tells me he is not interested in using an inhaler to treat this. -     POCT EXHALED NITRIC OXIDE  Diabetic peripheral neuropathy (HCC) -     Discontinue: HYDROcodone-acetaminophen (NORCO) 10-325 MG tablet; Take 1 tablet by mouth every 8 (eight) hours as needed. -     Discontinue: HYDROcodone-acetaminophen (NORCO) 10-325 MG tablet; Take 1 tablet by mouth every 8 (eight) hours as needed. -     HYDROcodone-acetaminophen (NORCO) 10-325 MG tablet; Take 1 tablet by mouth every 8 (eight) hours as needed.  Primary osteoarthritis involving multiple joints -     Discontinue: HYDROcodone-acetaminophen (NORCO) 10-325 MG tablet; Take 1 tablet by mouth every 8 (eight) hours as needed. -     Discontinue: HYDROcodone-acetaminophen (NORCO) 10-325 MG tablet; Take 1 tablet by mouth every 8 (eight) hours as needed. -     HYDROcodone-acetaminophen (NORCO) 10-325 MG tablet; Take 1 tablet by mouth every 8 (eight) hours as needed.  Primary osteoarthritis of both hips -     Discontinue: HYDROcodone-acetaminophen (NORCO) 10-325 MG tablet; Take 1 tablet by mouth every 8 (eight) hours as needed. -     Discontinue: HYDROcodone-acetaminophen (NORCO) 10-325 MG tablet; Take 1 tablet by mouth every 8 (eight) hours as needed. -     HYDROcodone-acetaminophen (NORCO) 10-325 MG tablet; Take 1 tablet by mouth every 8 (eight) hours as needed.  Type 2 diabetes mellitus with complication, with long-term current use of insulin (HCC)  Vitamin D deficiency -     Cholecalciferol 2000 units TABS; Take 1 tablet (2,000 Units total) by mouth daily.   I have discontinued [name] cholecalciferol and ibuprofen. I am also having him start on Cholecalciferol. Additionally, I am having him  maintain his vitamin C, vitamin E, aspirin EC, cycloSPORINE, Multiple Vitamins-Minerals (ZINC PO), Diclofenac Sodium, zinc gluconate, bisacodyl, glucose blood, atorvastatin, polyethylene glycol powder, thiamine, furosemide, potassium chloride SA, Insulin Detemir, levothyroxine, bisoprolol, losartan, ferrous sulfate, ULTICARE SHORT PEN NEEDLES, HYDROcodone-acetaminophen, and Empagliflozin-Metformin HCl.  Meds ordered this encounter  Medications  . DISCONTD: HYDROcodone-acetaminophen (NORCO) 10-325 MG tablet    Sig: Take 1 tablet by mouth every 8 (eight) hours as needed.    Dispense:  90 tablet    Refill:  0  . DISCONTD: HYDROcodone-acetaminophen (NORCO) 10-325 MG tablet    Sig: Take 1 tablet by mouth every 8 (eight) hours as needed.    Dispense:  90 tablet    Refill:  0  . HYDROcodone-acetaminophen (NORCO) 10-325 MG tablet    Sig: Take 1 tablet by mouth every 8 (eight) hours as needed.    Dispense:  90 tablet    Refill:  0  . Empagliflozin-Metformin HCl (SYNJARDY) 01-999 MG TABS    Sig: Take 1 tablet by mouth 2 (two) times daily.    Dispense:  60 tablet    Refill:  5  . Cholecalciferol 2000 units TABS    Sig: Take 1 tablet (2,000 Units total) by mouth daily.    Dispense:  90 tablet    Refill:  3     Follow-up: Return in about 4 months (around 04/23/2017).  Scarlette Calico, MD

## 2016-12-23 DIAGNOSIS — E559 Vitamin D deficiency, unspecified: Secondary | ICD-10-CM | POA: Insufficient documentation

## 2016-12-23 MED ORDER — CHOLECALCIFEROL 50 MCG (2000 UT) PO TABS: 1.0000 | ORAL_TABLET | Freq: Every day | ORAL | 3 refills | Status: DC

## 2016-12-24 LAB — ZINC: ZINC: 73 ug/dL (ref 60–130)

## 2016-12-26 ENCOUNTER — Telehealth (HOSPITAL_COMMUNITY): Payer: Self-pay | Admitting: Cardiology

## 2016-12-26 ENCOUNTER — Other Ambulatory Visit: Payer: Self-pay | Admitting: Internal Medicine

## 2016-12-26 DIAGNOSIS — D539 Nutritional anemia, unspecified: Secondary | ICD-10-CM

## 2016-12-26 DIAGNOSIS — Z9884 Bariatric surgery status: Secondary | ICD-10-CM

## 2016-12-26 LAB — VITAMIN B6: Vitamin B6: 2.4 ng/mL (ref 2.1–21.7)

## 2016-12-26 MED ORDER — ZINC GLUCONATE 50 MG PO TABS: 50.0000 mg | ORAL_TABLET | Freq: Every day | ORAL | 1 refills | Status: DC

## 2016-12-26 MED ORDER — VITAMIN B-6 25 MG PO TABS: 25.0000 mg | ORAL_TABLET | Freq: Every day | ORAL | 1 refills | Status: DC

## 2016-12-26 NOTE — Telephone Encounter (Signed)
Per staff message from Kevan Rosebush, RN, pt needs appt in June with Dr. Aundra Dubin [facility] pt that will see Dr. Aundra Dubin @[facility]).  Called pt to schedule, no answer & no machine to leave a message.  Will try to reach patient tomorrow.

## 2016-12-27 LAB — VITAMIN B1: VITAMIN B1 (THIAMINE): 18 nmol/L (ref 8–30)

## 2016-12-28 ENCOUNTER — Encounter: Payer: Self-pay | Admitting: Internal Medicine

## 2016-12-28 ENCOUNTER — Ambulatory Visit (INDEPENDENT_AMBULATORY_CARE_PROVIDER_SITE_OTHER): Payer: Medicare HMO | Admitting: Internal Medicine

## 2016-12-28 VITALS — BP 130/70 | HR 68 | Temp 98.4°F | Ht 73.0 in | Wt >= 6400 oz

## 2016-12-28 DIAGNOSIS — G4733 Obstructive sleep apnea (adult) (pediatric): Secondary | ICD-10-CM

## 2016-12-28 NOTE — Progress Notes (Signed)
Pre visit review using our clinic review tool, if applicable. No additional management support is needed unless otherwise documented below in the visit note. 

## 2016-12-29 NOTE — Progress Notes (Signed)
Subjective:  Patient ID: [name], male    DOB: 1956-02-28  Age: 61 y.o. MRN: [id number]  CC: No chief complaint on file.   HPI [name] presents for paperwork, he was not seen  Outpatient Medications Prior to Visit  Medication Sig Dispense Refill  . aspirin EC 325 MG tablet Take 325 mg by mouth every evening.     Marland Kitchen atorvastatin (LIPITOR) 40 MG tablet Take 1 tablet (40 mg total) by mouth daily. 90 tablet 3  . bisacodyl (DULCOLAX) 5 MG EC tablet Take 5 mg by mouth daily as needed for moderate constipation.    . bisoprolol (ZEBETA) 10 MG tablet TAKE 1 TABLET (10 MG TOTAL) BY MOUTH EVERY EVENING. 90 tablet 3  . Cholecalciferol 2000 units TABS Take 1 tablet (2,000 Units total) by mouth daily. 90 tablet 3  . cycloSPORINE (RESTASIS) 0.05 % ophthalmic emulsion Place 1 drop into both eyes 2 (two) times daily. 0.4 mL 5  . Diclofenac Sodium (PENNSAID) 1.5 % SOLN Place 0.2 mLs onto the skin 3 (three) times daily. 150 mL 11  . Empagliflozin-Metformin HCl (SYNJARDY) 01-999 MG TABS Take 1 tablet by mouth 2 (two) times daily. 60 tablet 5  . ferrous sulfate 325 (65 FE) MG tablet TAKE 1 TABLET BY MOUTH 2 TIMES DAILY WITH A MEAL. 180 tablet 2  . glucose blood (ONETOUCH VERIO) test strip Use to check blood sugar up to three times a day DX E10.29 300 each 1  . HYDROcodone-acetaminophen (NORCO) 10-325 MG tablet Take 1 tablet by mouth every 8 (eight) hours as needed. 90 tablet 0  . Insulin Detemir (LEVEMIR FLEXTOUCH) 100 UNIT/ML Pen Inject 240 Units into the skin every morning. 75 mL 5  . levothyroxine (SYNTHROID, LEVOTHROID) 50 MCG tablet TAKE 1 TABLET BY MOUTH DAILY BEFORE BREAKFAST. 90 tablet 3  . losartan (COZAAR) 25 MG tablet TAKE 1 TABLET (25 MG TOTAL) BY MOUTH DAILY. 90 tablet 3  . Multiple Vitamins-Minerals (ZINC PO) Take by mouth. Reported on 01/19/2016    . polyethylene glycol powder (GLYCOLAX/MIRALAX) powder TAKE 17 GRAMS BY MOUTH DAILY AS NEEDED FOR MILD CONSTIPATION. 527 g 11  . potassium  chloride SA (K-DUR,KLOR-CON) 20 MEQ tablet Take 1 tablet (20 mEq total) by mouth daily. 90 tablet 1  . thiamine (VITAMIN B-1) 100 MG tablet Take 1 tablet (100 mg total) by mouth daily. 90 tablet 3  . ULTICARE SHORT PEN NEEDLES 31G X 8 MM MISC USE WITH INSULIN PEN DAILY. 100 each 3  . vitamin B-6 (PYRIDOXINE) 25 MG tablet Take 1 tablet (25 mg total) by mouth daily. 90 tablet 1  . vitamin C (ASCORBIC ACID) 500 MG tablet Take 1,000 mg by mouth 2 (two) times daily. Reported on 01/19/2016    . vitamin E 200 UNIT capsule Take 400 Units by mouth every morning. Reported on 01/19/2016    . zinc gluconate 50 MG tablet Take 1 tablet (50 mg total) by mouth daily. Reported on 01/19/2016 90 tablet 1  . furosemide (LASIX) 40 MG tablet Take 1 tablet (40 mg total) by mouth 2 (two) times daily. 180 tablet 1   No facility-administered medications prior to visit.     ROS Review of Systems  Objective:  BP 130/70 (BP Location: Left Arm, Patient Position: Sitting, Cuff Size: Large)   Pulse 68   Temp 98.4 F (36.9 C) (Oral)   Ht 6\' 1"  (1.854 m)   Wt (!) 475 lb 6 oz (215.6 kg)   SpO2 98%  BMI 62.72 kg/m   BP Readings from Last 3 Encounters:  12/28/16 130/70  12/22/16 138/86  08/31/16 126/70    Wt Readings from Last 3 Encounters:  12/28/16 (!) 475 lb 6 oz (215.6 kg)  12/22/16 (!) 486 lb 4 oz (220.6 kg)  08/31/16 (!) 481 lb (218.2 kg)    Physical Exam  Lab Results  Component Value Date   WBC 6.1 12/22/2016   HGB 10.5 (L) 12/22/2016   HCT 32.6 (L) 12/22/2016   PLT 231.0 12/22/2016   GLUCOSE 276 (H) 12/22/2016   CHOL 165 08/02/2016   TRIG 87.0 08/02/2016   HDL 36.50 (L) 08/02/2016   LDLDIRECT 136.8 12/12/2006   LDLCALC 111 (H) 08/02/2016   ALT 21 08/02/2016   AST 21 08/02/2016   NA 136 12/22/2016   K 4.9 12/22/2016   CL 103 12/22/2016   CREATININE 1.35 12/22/2016   BUN 20 12/22/2016   CO2 28 12/22/2016   TSH 2.10 12/22/2016   PSA 1.29 08/02/2016   HGBA1C 10.7 (H) 12/22/2016   MICROALBUR  5.5 (H) 12/08/2015    Dg Chest 2 View  Result Date: 11/26/2015 CLINICAL DATA:  Sore throat, congestion, cough for 6 days, hypertension, CHF, diabetes mellitus EXAM: CHEST  2 VIEW COMPARISON:  01/14/2015 FINDINGS: Upper normal heart size. Question minimally prominent central pulmonary arteries. Mediastinal contours normal. Mild bibasilar atelectasis. No acute infiltrate, pleural effusion or pneumothorax. Bones unremarkable. IMPRESSION: Bibasilar atelectasis. Electronically Signed   By: Lavonia Dana M.D.   On: 11/26/2015 17:55    Assessment & Plan:   There are no diagnoses linked to this encounter. I am having [name] maintain his vitamin C, vitamin E, aspirin EC, cycloSPORINE, Multiple Vitamins-Minerals (ZINC PO), Diclofenac Sodium, bisacodyl, glucose blood, atorvastatin, polyethylene glycol powder, thiamine, furosemide, potassium chloride SA, Insulin Detemir, levothyroxine, bisoprolol, losartan, ferrous sulfate, ULTICARE SHORT PEN NEEDLES, HYDROcodone-acetaminophen, Empagliflozin-Metformin HCl, Cholecalciferol, vitamin B-6, and zinc gluconate.  No orders of the defined types were placed in this encounter.    Follow-up: No Follow-up on file.  Scarlette Calico, MD

## 2017-01-02 NOTE — Addendum Note (Signed)
Addended by: Aviva Signs M on: 01/02/2017 10:07 AM   Modules accepted: Orders

## 2017-01-02 NOTE — Telephone Encounter (Signed)
Called and spoke with pt.  He scheduled an appt for 02/24/17 @9 :40 with Dr. Aundra Dubin.  Pt is aware Dr. Aundra Dubin is now at the [facility], also given the phone number for our clinic.

## 2017-01-03 NOTE — Addendum Note (Signed)
Addended by: Aviva Signs M on: 01/03/2017 11:40 AM   Modules accepted: Orders

## 2017-01-05 ENCOUNTER — Telehealth: Payer: Self-pay

## 2017-01-05 NOTE — Telephone Encounter (Signed)
Form completed, OV notes printed, PCP has signed and Company contacted.

## 2017-01-10 ENCOUNTER — Telehealth: Payer: Self-pay | Admitting: Internal Medicine

## 2017-01-12 NOTE — Telephone Encounter (Signed)
Reprinted letter for PCP to sign.

## 2017-01-17 DIAGNOSIS — G4733 Obstructive sleep apnea (adult) (pediatric): Secondary | ICD-10-CM | POA: Diagnosis not present

## 2017-01-20 ENCOUNTER — Telehealth: Payer: Self-pay | Admitting: Internal Medicine

## 2017-01-20 NOTE — Telephone Encounter (Signed)
Pt called wanting to know if we received a fax for , [employer] for power wheelchiar

## 2017-01-23 NOTE — Telephone Encounter (Signed)
Can you call pt for me and ask if he is wanting it from [employer] or [employer]?

## 2017-01-23 NOTE — Telephone Encounter (Signed)
Noted. Should be able to start on it tomorrow.

## 2017-01-23 NOTE — Telephone Encounter (Signed)
Patient states that [employer] is no good.  To go with [employer].

## 2017-01-24 ENCOUNTER — Other Ambulatory Visit: Payer: Self-pay | Admitting: Internal Medicine

## 2017-01-24 NOTE — Telephone Encounter (Signed)
Ordered and printed information needed for [employer].   Given to PCP for signature.

## 2017-01-24 NOTE — Addendum Note (Signed)
Addended by: Aviva Signs M on: 01/24/2017 10:02 AM   Modules accepted: Orders

## 2017-01-25 NOTE — Telephone Encounter (Signed)
Faxed to [employer].

## 2017-01-27 ENCOUNTER — Ambulatory Visit (INDEPENDENT_AMBULATORY_CARE_PROVIDER_SITE_OTHER): Payer: Medicare HMO | Admitting: Internal Medicine

## 2017-01-27 ENCOUNTER — Encounter: Payer: Self-pay | Admitting: Internal Medicine

## 2017-01-27 VITALS — BP 126/70 | HR 72 | Ht 72.0 in | Wt >= 6400 oz

## 2017-01-27 DIAGNOSIS — I10 Essential (primary) hypertension: Secondary | ICD-10-CM | POA: Diagnosis not present

## 2017-01-27 DIAGNOSIS — H6693 Otitis media, unspecified, bilateral: Secondary | ICD-10-CM

## 2017-01-27 DIAGNOSIS — I5032 Chronic diastolic (congestive) heart failure: Secondary | ICD-10-CM

## 2017-01-27 MED ORDER — AZITHROMYCIN 250 MG PO TABS: ORAL_TABLET | ORAL | 1 refills | Status: DC

## 2017-01-27 NOTE — Patient Instructions (Signed)
Please take all new medication as prescribed - the antibiotic  You can also take Delsym OTC for cough, and/or Mucinex (or it's generic off brand) for congestion, and tylenol as needed for pain.  Please continue all other medications as before, and refills have been done if requested.  Please have the pharmacy call with any other refills you may need.  Please keep your appointments with your specialists as you may have planned    

## 2017-01-27 NOTE — Progress Notes (Signed)
Subjective:    Patient ID: [name], male    DOB: [birth year], 61 y.o.   MRN: [id number]  HPI  Here with 3 days onset mild persistent but worsening bilat ear fullness, discomfort, muffled hearing on the left and occas dizziness, but no tinnitus, fever, HA, ST, sinus symptoms or cough.  No hx of allergies per pt. Pt denies chest pain, increased sob or doe, wheezing, orthopnea, PND, increased LE swelling, palpitations, dizziness or syncope.  Pt denies polydipsia, polyuria.  Has gained some wt but denies CV symptoms. Wt Readings from Last 3 Encounters:  01/27/17 (!) 488 lb (221.4 kg)  12/28/16 (!) 475 lb 6 oz (215.6 kg)  12/22/16 (!) 486 lb 4 oz (220.6 kg)   Past Medical History:  Diagnosis Date  . Anemia   . CHF (congestive heart failure) (HCC)    Presumed diastolic. Echo (06/07) w.EF 45%, severe posterior HK, mild LV hypertrophy, No further work-up of abnormal echo was done.  . Colon cancer [facility]) 2007   s/p sigmoid colectomy  . Diabetes mellitus    Has Hx of diabetic foot ulcer & peripheral neuropathy  . History of PFTs 05/2009   Mild Obstructive defect  . HTN (hypertension)   . Hyperlipidemia   . Morbid obesity ([place])   . OSA on CPAP    Past Surgical History:  Procedure Laterality Date  . BARIATRIC SURGERY  12/2009   Lap Band/At [facility]  . COLONOSCOPY  multiple  . COLONOSCOPY WITH PROPOFOL N/A 11/26/2012   Procedure: COLONOSCOPY WITH PROPOFOL;  Surgeon: Gatha Mayer, MD;  Location: [facility];  Service: Endoscopy;  Laterality: N/A;  may need pre appt. with anesthesia due to morbid obesity  . [place]   '80/Right  '84/Left  . EYE SURGERY    . HERNIA REPAIR    . HIP SURGERY     bilateral  . INCISIONAL HERNIA REPAIR N/A 12/18/2013   Procedure:  REPAIR OF INCARCERATED INCISIONAL HERNIA;  Surgeon: Rolm Bookbinder, MD;  Location: [place];  Service: General;  Laterality: N/A;  . INSERTION OF MESH N/A 12/18/2013   Procedure: INSERTION OF MESH;  Surgeon:  Rolm Bookbinder, MD;  Location: [place];  Service: General;  Laterality: N/A;  . Sigmoid Colectomy  10/2005   [facility]  . TONSILLECTOMY      reports that he has never smoked. He has never used smokeless tobacco. He reports that he does not drink alcohol or use drugs. family history includes CAD in his mother; Congestive Heart Failure in his mother; Diabetes in his father and mother; Heart attack (age of onset: 41) in his brother; Heart failure in his brother; Hepatitis in his mother. Allergies  Allergen Reactions  . Shellfish Allergy Anaphylaxis  . Lisinopril Cough   Current Outpatient Prescriptions on File Prior to Visit  Medication Sig Dispense Refill  . aspirin EC 325 MG tablet Take 325 mg by mouth every evening.     Marland Kitchen atorvastatin (LIPITOR) 40 MG tablet TAKE 1 TABLET BY MOUTH DAILY. 90 tablet 3  . bisacodyl (DULCOLAX) 5 MG EC tablet Take 5 mg by mouth daily as needed for moderate constipation.    . bisoprolol (ZEBETA) 10 MG tablet TAKE 1 TABLET (10 MG TOTAL) BY MOUTH EVERY EVENING. 90 tablet 3  . Cholecalciferol 2000 units TABS Take 1 tablet (2,000 Units total) by mouth daily. 90 tablet 3  . cycloSPORINE (RESTASIS) 0.05 % ophthalmic emulsion Place 1 drop into both eyes 2 (two) times daily.  0.4 mL 5  . Diclofenac Sodium (PENNSAID) 1.5 % SOLN Place 0.2 mLs onto the skin 3 (three) times daily. 150 mL 11  . Empagliflozin-Metformin HCl (SYNJARDY) 01-999 MG TABS Take 1 tablet by mouth 2 (two) times daily. 60 tablet 5  . ferrous sulfate 325 (65 FE) MG tablet TAKE 1 TABLET BY MOUTH 2 TIMES DAILY WITH A MEAL. 180 tablet 2  . glucose blood (ONETOUCH VERIO) test strip Use to check blood sugar up to three times a day DX E10.29 300 each 1  . HYDROcodone-acetaminophen (NORCO) 10-325 MG tablet Take 1 tablet by mouth every 8 (eight) hours as needed. 90 tablet 0  . Insulin Detemir (LEVEMIR FLEXTOUCH) 100 UNIT/ML Pen Inject 240 Units into the skin every morning. 75 mL 5  . levothyroxine (SYNTHROID,  LEVOTHROID) 50 MCG tablet TAKE 1 TABLET BY MOUTH DAILY BEFORE BREAKFAST. 90 tablet 3  . losartan (COZAAR) 25 MG tablet TAKE 1 TABLET (25 MG TOTAL) BY MOUTH DAILY. 90 tablet 3  . Multiple Vitamins-Minerals (ZINC PO) Take by mouth. Reported on 01/19/2016    . polyethylene glycol powder (GLYCOLAX/MIRALAX) powder TAKE 17 GRAMS BY MOUTH DAILY AS NEEDED FOR MILD CONSTIPATION. 527 g 11  . potassium chloride SA (K-DUR,KLOR-CON) 20 MEQ tablet Take 1 tablet (20 mEq total) by mouth daily. 90 tablet 1  . thiamine (VITAMIN B-1) 100 MG tablet Take 1 tablet (100 mg total) by mouth daily. 90 tablet 3  . ULTICARE SHORT PEN NEEDLES 31G X 8 MM MISC USE WITH INSULIN PEN DAILY. 100 each 3  . vitamin B-6 (PYRIDOXINE) 25 MG tablet Take 1 tablet (25 mg total) by mouth daily. 90 tablet 1  . vitamin C (ASCORBIC ACID) 500 MG tablet Take 1,000 mg by mouth 2 (two) times daily. Reported on 01/19/2016    . vitamin E 200 UNIT capsule Take 400 Units by mouth every morning. Reported on 01/19/2016    . zinc gluconate 50 MG tablet Take 1 tablet (50 mg total) by mouth daily. Reported on 01/19/2016 90 tablet 1  . furosemide (LASIX) 40 MG tablet Take 1 tablet (40 mg total) by mouth 2 (two) times daily. 180 tablet 1   No current facility-administered medications on file prior to visit.    Review of Systems  Constitutional: Negative for other unusual diaphoresis or sweats HENT: Negative for ear discharge or swelling Eyes: Negative for other worsening visual disturbances Respiratory: Negative for stridor or other swelling  Gastrointestinal: Negative for worsening distension or other blood Genitourinary: Negative for retention or other urinary change Musculoskeletal: Negative for other MSK pain or swelling Skin: Negative for color change or other new lesions Neurological: Negative for worsening tremors and other numbness  Psychiatric/Behavioral: Negative for worsening agitation or other fatigue All other system neg per pt    Objective:    Physical Exam BP 126/70   Pulse 72   Ht 6' (1.829 m)   Wt (!) 488 lb (221.4 kg)   SpO2 97%   BMI 66.18 kg/m 'VS noted,  Constitutional: Pt appears in NAD HENT: Head: NCAT.  Right Ear: External ear normal.  Left Ear: External ear normal.  Eyes: . Pupils are equal, round, and reactive to light. Conjunctivae and EOM are normal Bilat tm's with mild erythema.  Max sinus areas non tender.  Pharynx with mild erythema, no exudate Nose: without d/c or deformity Neck: Neck supple. Gross normal ROM Cardiovascular: Normal rate and regular rhythm.   Pulmonary/Chest: Effort normal and breath sounds without rales or wheezing.  Neurological: Pt is alert. At baseline orientation, motor grossly intact Skin: Skin is warm. No rashes, other new lesions, no LE edema Psychiatric: Pt behavior is normal without agitation  No other exam findings       Assessment & Plan:

## 2017-01-29 NOTE — Assessment & Plan Note (Signed)
BP Readings from Last 3 Encounters:  01/27/17 126/70  12/28/16 130/70  12/22/16 138/86  stable overall by history and exam, recent data reviewed with pt, and pt to continue medical treatment as before,  to f/u any worsening symptoms or concerns

## 2017-01-29 NOTE — Assessment & Plan Note (Signed)
afeb but suspect early otitis media, Mild to mod, for antibx course,  to f/u any worsening symptoms or concerns

## 2017-01-29 NOTE — Assessment & Plan Note (Signed)
stable overall by history and exam, and pt to continue medical treatment as before,  to f/u any worsening symptoms or concerns 

## 2017-02-03 ENCOUNTER — Encounter: Payer: Self-pay | Admitting: Nurse Practitioner

## 2017-02-03 ENCOUNTER — Ambulatory Visit (INDEPENDENT_AMBULATORY_CARE_PROVIDER_SITE_OTHER): Payer: Medicare HMO | Admitting: Nurse Practitioner

## 2017-02-03 VITALS — BP 128/70 | HR 73 | Ht 72.0 in | Wt >= 6400 oz

## 2017-02-03 DIAGNOSIS — R1010 Upper abdominal pain, unspecified: Secondary | ICD-10-CM | POA: Diagnosis not present

## 2017-02-03 MED ORDER — OMEPRAZOLE 40 MG PO CPDR: DELAYED_RELEASE_CAPSULE | ORAL | 3 refills | Status: DC

## 2017-02-03 NOTE — Patient Instructions (Signed)
We have sent the following medications to your pharmacy for you to pick up at your convenience: 1. Omeprazole 40 mg   Call our office at [phone] and ask for Tye Savoy NP's nurse with an update.

## 2017-02-03 NOTE — Progress Notes (Addendum)
HPI:  Patient is a 61 year old male known to Dr. [name]. He has a personal history of colon cancer, status post resection. He is due for surveillance colonoscopy March 2019. Patient is morbidly obese, he had a lap band placed in 2011 at [facility] but hasn't had follow up in a couple of years.  Patient comes in today for evaluation of upper abdominal pain which started about a week ago. He is unable to characterize the pain but states it is intermittent, occurs a few times a day unrelated to meals or physical activity. When pain occurs his upper abdomen becomes distended and hard. After 15-20 minutes symptoms spontaneously resolve. He has no excessive gas. No nausea or vomiting. Bowel movements are normal. Pain usually about a 7/10 on pain scale.    Past Medical History:  Diagnosis Date  . Anemia   . CHF (congestive heart failure) (HCC)    Presumed diastolic. Echo (06/07) w.EF 45%, severe posterior HK, mild LV hypertrophy, No further work-up of abnormal echo was done.  . Colon cancer [facility]) 2007   s/p sigmoid colectomy  . Diabetes mellitus    Has Hx of diabetic foot ulcer & peripheral neuropathy  . History of PFTs 05/2009   Mild Obstructive defect  . HTN (hypertension)   . Hyperlipidemia   . Morbid obesity ([employer])   . OSA on CPAP      Past Surgical History:  Procedure Laterality Date  . BARIATRIC SURGERY  12/2009   Lap Band/At [facility]  . COLONOSCOPY  multiple  . COLONOSCOPY WITH PROPOFOL N/A 11/26/2012   Procedure: COLONOSCOPY WITH PROPOFOL;  Surgeon: Gatha Mayer, MD;  [facility];  Service: Endoscopy;  Laterality: N/A;  may need pre appt. with anesthesia due to morbid obesity  . [place]   '80/Right  '84/Left  . EYE SURGERY    . HERNIA REPAIR    . HIP SURGERY     bilateral  . INCISIONAL HERNIA REPAIR N/A 12/18/2013   Procedure:  REPAIR OF INCARCERATED INCISIONAL HERNIA;  Surgeon: Rolm Bookbinder, MD;  Location: [place];  Service: General;   Laterality: N/A;  . INSERTION OF MESH N/A 12/18/2013   Procedure: INSERTION OF MESH;  Surgeon: Rolm Bookbinder, MD;  Location: [place];  Service: General;  Laterality: N/A;  . Sigmoid Colectomy  10/2005   [facility]  . TONSILLECTOMY     Family History  Problem Relation Age of Onset  . Hepatitis Mother        hepatitis C  . Diabetes Mother   . Congestive Heart Failure Mother   . CAD Mother   . Heart attack Brother 78  . Diabetes Father   . Heart failure Brother   . Cancer Neg Hx        colon or prostate   Social History  Substance Use Topics  . Smoking status: Never Smoker  . Smokeless tobacco: Never Used  . Alcohol use No     Comment: states he does not drink    Current Outpatient Prescriptions  Medication Sig Dispense Refill  . aspirin EC 325 MG tablet Take 325 mg by mouth every evening.     Marland Kitchen atorvastatin (LIPITOR) 40 MG tablet TAKE 1 TABLET BY MOUTH DAILY. 90 tablet 3  . azithromycin (ZITHROMAX Z-PAK) 250 MG tablet 2 tab by mouth day 1, then 1 per day 6 tablet 1  . bisacodyl (DULCOLAX) 5 MG EC tablet Take 5 mg by mouth daily as  needed for moderate constipation.    . bisoprolol (ZEBETA) 10 MG tablet TAKE 1 TABLET (10 MG TOTAL) BY MOUTH EVERY EVENING. 90 tablet 3  . Cholecalciferol 2000 units TABS Take 1 tablet (2,000 Units total) by mouth daily. 90 tablet 3  . cycloSPORINE (RESTASIS) 0.05 % ophthalmic emulsion Place 1 drop into both eyes 2 (two) times daily. 0.4 mL 5  . Diclofenac Sodium (PENNSAID) 1.5 % SOLN Place 0.2 mLs onto the skin 3 (three) times daily. 150 mL 11  . Empagliflozin-Metformin HCl (SYNJARDY) 01-999 MG TABS Take 1 tablet by mouth 2 (two) times daily. 60 tablet 5  . ferrous sulfate 325 (65 FE) MG tablet TAKE 1 TABLET BY MOUTH 2 TIMES DAILY WITH A MEAL. 180 tablet 2  . glucose blood (ONETOUCH VERIO) test strip Use to check blood sugar up to three times a day DX E10.29 300 each 1  . HYDROcodone-acetaminophen (NORCO) 10-325 MG tablet Take 1 tablet by mouth every 8  (eight) hours as needed. 90 tablet 0  . Insulin Detemir (LEVEMIR FLEXTOUCH) 100 UNIT/ML Pen Inject 240 Units into the skin every morning. 75 mL 5  . levothyroxine (SYNTHROID, LEVOTHROID) 50 MCG tablet TAKE 1 TABLET BY MOUTH DAILY BEFORE BREAKFAST. 90 tablet 3  . losartan (COZAAR) 25 MG tablet TAKE 1 TABLET (25 MG TOTAL) BY MOUTH DAILY. 90 tablet 3  . Multiple Vitamins-Minerals (ZINC PO) Take by mouth. Reported on 01/19/2016    . polyethylene glycol powder (GLYCOLAX/MIRALAX) powder TAKE 17 GRAMS BY MOUTH DAILY AS NEEDED FOR MILD CONSTIPATION. 527 g 11  . potassium chloride SA (K-DUR,KLOR-CON) 20 MEQ tablet Take 1 tablet (20 mEq total) by mouth daily. 90 tablet 1  . thiamine (VITAMIN B-1) 100 MG tablet Take 1 tablet (100 mg total) by mouth daily. 90 tablet 3  . ULTICARE SHORT PEN NEEDLES 31G X 8 MM MISC USE WITH INSULIN PEN DAILY. 100 each 3  . vitamin B-6 (PYRIDOXINE) 25 MG tablet Take 1 tablet (25 mg total) by mouth daily. 90 tablet 1  . vitamin C (ASCORBIC ACID) 500 MG tablet Take 1,000 mg by mouth 2 (two) times daily. Reported on 01/19/2016    . vitamin E 200 UNIT capsule Take 400 Units by mouth every morning. Reported on 01/19/2016    . zinc gluconate 50 MG tablet Take 1 tablet (50 mg total) by mouth daily. Reported on 01/19/2016 90 tablet 1  . furosemide (LASIX) 40 MG tablet Take 1 tablet (40 mg total) by mouth 2 (two) times daily. 180 tablet 1   No current facility-administered medications for this visit.    Allergies  Allergen Reactions  . Shellfish Allergy Anaphylaxis  . Lisinopril Cough     Review of Systems: All systems reviewed and negative except where noted in HPI.    Physical Exam: BP 128/70   Pulse 73   Ht 6' (1.829 m)   Wt (!) 472 lb (214.1 kg)   SpO2 98%   BMI 64.01 kg/m  Constitutional:  Morbidly obese male in no acute distress. Psychiatric: Normal mood and affect. Behavior is normal. EENT:  Conjunctivae are normal. No scleral icterus. Neck supple.  Cardiovascular:  Normal rate, regular rhythm.  Pulmonary/chest: Effort normal  Abdominal: Soft, nontender. Bowel sounds active throughout. Appears to have a lap band port in upper abdomen.  Extremities: no edema Neurological: Alert and oriented to person place and time. Skin: Skin is warm and dry. No rashes noted.   ASSESSMENT AND PLAN:  1. 61 yo pleasant, morbidly  obese male with a one week history of intermittent, non-radiating dull epigastric pain. Pain accompanied by upper abdominal distention / hardness. Symptoms last 15-20 minutes then resolve spontaneously. Doesn't sound obstructive given absence of nausea / vomiting. -etiology unclear. I'm going to try him on Omeprazole 40 mg daily but if no improvement will need further testing, possibly at CT scan to evaluate lap band, look for a hernia, evaluate pancreas etc...  2. Chest discomfort, sees Cardiology (Dr. Marigene Ehlers)    3. DM, diastolic heart failure / morbid obesity.     4. Hx of incarcerated incisonal hernia in 2015   Tye Savoy, NP  02/03/2017, 9:42 AM  Agree with Ms. Vanita Ingles assessment and plan. Gatha Mayer, MD, [name]

## 2017-02-06 ENCOUNTER — Telehealth: Payer: Self-pay | Admitting: Internal Medicine

## 2017-02-06 DIAGNOSIS — E113593 Type 2 diabetes mellitus with proliferative diabetic retinopathy without macular edema, bilateral: Secondary | ICD-10-CM | POA: Diagnosis not present

## 2017-02-06 DIAGNOSIS — H2513 Age-related nuclear cataract, bilateral: Secondary | ICD-10-CM | POA: Diagnosis not present

## 2017-02-06 DIAGNOSIS — H2511 Age-related nuclear cataract, right eye: Secondary | ICD-10-CM | POA: Diagnosis not present

## 2017-02-06 DIAGNOSIS — H52203 Unspecified astigmatism, bilateral: Secondary | ICD-10-CM | POA: Diagnosis not present

## 2017-02-06 DIAGNOSIS — H269 Unspecified cataract: Secondary | ICD-10-CM | POA: Diagnosis not present

## 2017-02-06 DIAGNOSIS — H25012 Cortical age-related cataract, left eye: Secondary | ICD-10-CM | POA: Insufficient documentation

## 2017-02-06 DIAGNOSIS — Z947 Corneal transplant status: Secondary | ICD-10-CM | POA: Diagnosis not present

## 2017-02-06 DIAGNOSIS — H179 Unspecified corneal scar and opacity: Secondary | ICD-10-CM | POA: Diagnosis not present

## 2017-02-06 DIAGNOSIS — H4423 Degenerative myopia, bilateral: Secondary | ICD-10-CM | POA: Diagnosis not present

## 2017-02-06 DIAGNOSIS — H524 Presbyopia: Secondary | ICD-10-CM | POA: Diagnosis not present

## 2017-02-06 DIAGNOSIS — E083593 Diabetes mellitus due to underlying condition with proliferative diabetic retinopathy without macular edema, bilateral: Secondary | ICD-10-CM | POA: Diagnosis not present

## 2017-02-06 DIAGNOSIS — Z794 Long term (current) use of insulin: Secondary | ICD-10-CM | POA: Diagnosis not present

## 2017-02-06 DIAGNOSIS — H17823 Peripheral opacity of cornea, bilateral: Secondary | ICD-10-CM | POA: Insufficient documentation

## 2017-02-06 DIAGNOSIS — E11319 Type 2 diabetes mellitus with unspecified diabetic retinopathy without macular edema: Secondary | ICD-10-CM | POA: Diagnosis not present

## 2017-02-06 NOTE — Telephone Encounter (Signed)
[employer] called and would like the faxes resent over that were sent on 5/9. It was the pts demographics and chart notes from 12/22/16. They received them side ways and some pages printed very small.   At the top it says status addendum Dr. [name], but they want it to say signed Dr. [name] instead for insurance purposes

## 2017-02-06 NOTE — Telephone Encounter (Signed)
Please research where I need to send information to.

## 2017-02-06 NOTE — Telephone Encounter (Signed)
[facility]  [address]  [place]  [address]  [phone]  Needs:   Script for Physical Therapy Evaluation for Power Wheel Chair       W/ Pt DOB and Mobility Diagnosis on script   Face to Face    Demographics   Chart notes

## 2017-02-07 ENCOUNTER — Telehealth: Payer: Self-pay

## 2017-02-07 NOTE — Telephone Encounter (Signed)
Called to the patient and left a voicemail that [name] wanted to know how he is doing. Requested he call back with his progress.

## 2017-02-07 NOTE — Telephone Encounter (Signed)
-----   Message from Willia Craze, NP sent at 02/06/2017  5:54 PM EDT ----- Eustaquio Maize, I sent my Friday note to [facility]. I don't know if he will add anything but can you call this patient tomorrow or wed and see if better on PPI. Thanks

## 2017-02-08 NOTE — Telephone Encounter (Signed)
Faxed

## 2017-02-10 NOTE — Telephone Encounter (Signed)
Patient has not called back

## 2017-02-15 ENCOUNTER — Other Ambulatory Visit: Payer: Self-pay

## 2017-02-15 ENCOUNTER — Telehealth: Payer: Self-pay | Admitting: Nurse Practitioner

## 2017-02-15 DIAGNOSIS — R1084 Generalized abdominal pain: Secondary | ICD-10-CM

## 2017-02-15 NOTE — Telephone Encounter (Signed)
Patient calls back today. He is on Prilosec 40 mg as directed. He calls with complaints of increased abdominal discomfort. New symptom of burning despite PPI. No vomiting or nausea. Please advise on how to proceed.

## 2017-02-15 NOTE — Telephone Encounter (Signed)
Please see the telephone encounter from 02/07/17

## 2017-02-15 NOTE — Progress Notes (Signed)
c-met  

## 2017-02-15 NOTE — Telephone Encounter (Signed)
Patient says he will come Friday for his labs. Declines to come in today. States he has an appointment with another doctor tomorrow and cannot come then either.

## 2017-02-15 NOTE — Telephone Encounter (Signed)
Beth, please ask him to come by for CMET, lipase and CBC. Thanks

## 2017-02-17 DIAGNOSIS — G4733 Obstructive sleep apnea (adult) (pediatric): Secondary | ICD-10-CM | POA: Diagnosis not present

## 2017-02-20 ENCOUNTER — Other Ambulatory Visit: Payer: Self-pay | Admitting: Cardiology

## 2017-02-20 DIAGNOSIS — I10 Essential (primary) hypertension: Secondary | ICD-10-CM

## 2017-02-20 DIAGNOSIS — I5032 Chronic diastolic (congestive) heart failure: Secondary | ICD-10-CM

## 2017-02-21 ENCOUNTER — Encounter: Payer: Self-pay | Admitting: Internal Medicine

## 2017-02-21 ENCOUNTER — Telehealth: Payer: Self-pay

## 2017-02-21 NOTE — Telephone Encounter (Signed)
-----   Message from Willia Craze, NP sent at 02/21/2017  3:28 PM EDT ----- Thanks, please convert to phone note ----- Message ----- From: Greggory Keen, LPN Sent: 1/0/2725  36:64 AM To: Willia Craze, NP  Sorry Nevin Bloodgood. He doesn't have a working number. I will leave a message with his sister to call us if he is not 100% well. ----- Message ----- From: Willia Craze, NP Sent: 02/20/2017  10:43 AM To: Greggory Keen, LPN  Beth, I don't see where he ever came by for the labs. Can you please have him to do and also lets go ahead with CTscan abd / pelvis if renal function is okay. Thanks

## 2017-02-21 NOTE — Telephone Encounter (Signed)
No emergency contact phone number either

## 2017-02-22 ENCOUNTER — Ambulatory Visit: Payer: Medicare HMO | Attending: Internal Medicine | Admitting: Physical Therapy

## 2017-02-24 ENCOUNTER — Ambulatory Visit (HOSPITAL_COMMUNITY)
Admission: RE | Admit: 2017-02-24 | Discharge: 2017-02-24 | Disposition: A | Discharge status: Home / Self Care | Payer: Medicare HMO | Source: Ambulatory Visit | Attending: Cardiology | Admitting: Cardiology

## 2017-02-24 ENCOUNTER — Encounter (HOSPITAL_COMMUNITY): Payer: Self-pay

## 2017-02-24 VITALS — BP 150/74 | HR 89 | Wt >= 6400 oz

## 2017-02-24 DIAGNOSIS — Z85038 Personal history of other malignant neoplasm of large intestine: Secondary | ICD-10-CM | POA: Diagnosis not present

## 2017-02-24 DIAGNOSIS — E11621 Type 2 diabetes mellitus with foot ulcer: Secondary | ICD-10-CM | POA: Insufficient documentation

## 2017-02-24 DIAGNOSIS — R634 Abnormal weight loss: Secondary | ICD-10-CM | POA: Insufficient documentation

## 2017-02-24 DIAGNOSIS — G4733 Obstructive sleep apnea (adult) (pediatric): Secondary | ICD-10-CM | POA: Insufficient documentation

## 2017-02-24 DIAGNOSIS — I5032 Chronic diastolic (congestive) heart failure: Secondary | ICD-10-CM | POA: Diagnosis not present

## 2017-02-24 DIAGNOSIS — E1122 Type 2 diabetes mellitus with diabetic chronic kidney disease: Secondary | ICD-10-CM | POA: Diagnosis not present

## 2017-02-24 DIAGNOSIS — Z7982 Long term (current) use of aspirin: Secondary | ICD-10-CM | POA: Insufficient documentation

## 2017-02-24 DIAGNOSIS — Z79899 Other long term (current) drug therapy: Secondary | ICD-10-CM | POA: Insufficient documentation

## 2017-02-24 DIAGNOSIS — Z9884 Bariatric surgery status: Secondary | ICD-10-CM | POA: Diagnosis not present

## 2017-02-24 DIAGNOSIS — I130 Hypertensive heart and chronic kidney disease with heart failure and stage 1 through stage 4 chronic kidney disease, or unspecified chronic kidney disease: Secondary | ICD-10-CM | POA: Diagnosis not present

## 2017-02-24 DIAGNOSIS — N189 Chronic kidney disease, unspecified: Secondary | ICD-10-CM | POA: Insufficient documentation

## 2017-02-24 DIAGNOSIS — E785 Hyperlipidemia, unspecified: Secondary | ICD-10-CM | POA: Insufficient documentation

## 2017-02-24 DIAGNOSIS — Z8249 Family history of ischemic heart disease and other diseases of the circulatory system: Secondary | ICD-10-CM | POA: Diagnosis not present

## 2017-02-24 DIAGNOSIS — I10 Essential (primary) hypertension: Secondary | ICD-10-CM | POA: Diagnosis not present

## 2017-02-24 LAB — BASIC METABOLIC PANEL
ANION GAP: 8 (ref 5–15)
BUN: 32 mg/dL — ABNORMAL HIGH (ref 6–20)
CALCIUM: 9.6 mg/dL (ref 8.9–10.3)
CO2: 28 mmol/L (ref 22–32)
Chloride: 101 mmol/L (ref 101–111)
Creatinine, Ser: 2.05 mg/dL — ABNORMAL HIGH (ref 0.61–1.24)
GFR, EST AFRICAN AMERICAN: 39 mL/min — AB (ref >60)
GFR, EST NON AFRICAN AMERICAN: 34 mL/min — AB (ref >60)
Glucose, Bld: 184 mg/dL — ABNORMAL HIGH (ref 65–99)
Potassium: 5.2 mmol/L — ABNORMAL HIGH (ref 3.5–5.1)
SODIUM: 137 mmol/L (ref 135–145)

## 2017-02-24 LAB — LIPID PANEL
CHOLESTEROL: 133 mg/dL (ref 0–200)
HDL: 27 mg/dL — AB (ref >40)
LDL Cholesterol: 84 mg/dL (ref 0–99)
TRIGLYCERIDES: 111 mg/dL (ref <150)
Total CHOL/HDL Ratio: 4.9 RATIO
VLDL: 22 mg/dL (ref 0–40)

## 2017-02-24 MED ORDER — LOSARTAN POTASSIUM 25 MG PO TABS: 50.0000 mg | ORAL_TABLET | Freq: Every day | ORAL | 3 refills | Status: DC

## 2017-02-24 NOTE — Patient Instructions (Signed)
Labs today (will call for abnormal results, otherwise no news is good news)  Please contact the Bariatric Program at [facility] @ [phone].  INCREASE Losartan to 50 mg (2 Tablets) Once Daily  Labs in 2 weeks (BMET)  Follow up in 4-5 months, we will contact you to schedule this appointment.

## 2017-02-25 NOTE — Progress Notes (Signed)
Patient ID: [name], male   DOB: 23-Oct-1955, 61 y.o.   MRN: [id number] PCP: Dr. [name] Cardiology: Dr. Aundra Dubin  61 yo with history of morbid obesity, diabetes, diastolic CHF presents for cardiology followup.  He has lost weight since last appointment.  He still has limited mobility due to size and uses a rolling walker. He has stable dyspnea with walking < 1 block. Short of breath carrying any load.  He is taking Lasix 80 mg bid which is considerably more than when I saw him last.  His legs are weak and he has chronic low back pain.  He is doing water aerobics now.  No falls.  No chest pain.       Labs (8/10): creatinine 1.1, LDL 94, HDL 34 Labs (8/11): K 4.8, creatinine 1.2, LDL 93, HDL 33 Labs (10/12): K 5.4, creatinine 1.5, LDL 95, HDL 42 Labs (8/14): LDL 115, HDL 42 Labs (9/14): BNP 68 Labs (10/14): K 4.5, creatinine 1.6 Labs (7/15): K 4.4, creatinine 1.3 Labs (8/15): LDL 136, HDL 37 Labs (11/17): K 4.6, creatinine 1.43, LDL 111, HDL 37 Labs (4/18): K 4.9, creatinine 1.35, hgb 10.5  Allergies (verified):  1)  ! * Shellfish 2)  * Shellfish  Past Medical History: 1. Diabetes mellitus.  Has history of diabetic foot ulcer and diabetic peripheral neuropathy. .  2. Hyperlipidemia 3. Hypertension: ACEI cough.  4. Colon cancer: s/p sigmoid colectomy in 2007 5. Morbid obesity s/p lap band procedure.  6. Anemia 7. Obstructive Sleep Apnea: Uses CPAP 8. CHF: Diastolic.  Echo (5/12): EF 50-55%, mild LV dilation, mild LVH, moderate (grade II) diastolic dysfunction.  Echo (9/14) with EF 50-55%, mild LVH, moderately dilated LV, RV poorly visualized.  9. PFTs (9/10): mild obstructive defect 10.  Dobutamine stress echo (9/10): EF 55%, mild LV hypertrophy, no significant valvular disease, no ischemia or infarction  11. CKD 12. Ventral hernia s/p repair.   Family History: father- deceased @59 : Asbestosis mother - deceased @ 59: CHF, CAD, Hepatitis C Neg- colon or prostate cancer. Brother -  died of heart failure and had MI at 59  Social History: [employer], 2 years of college Native of [place]. 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 Never Smoked Alcohol use-occasional.   Drug use-no Regular exercise-no  ROS: All systems reviewed and negative except as per HPI.    Current Outpatient Prescriptions  Medication Sig Dispense Refill  . aspirin EC 325 MG tablet Take 81 mg by mouth every evening.     Marland Kitchen atorvastatin (LIPITOR) 40 MG tablet TAKE 1 TABLET BY MOUTH DAILY. 90 tablet 3  . bisacodyl (DULCOLAX) 5 MG EC tablet Take 5 mg by mouth daily as needed for moderate constipation.    . bisoprolol (ZEBETA) 10 MG tablet TAKE 1 TABLET (10 MG TOTAL) BY MOUTH EVERY EVENING. 90 tablet 3  . Cholecalciferol 2000 units TABS Take 1 tablet (2,000 Units total) by mouth daily. 90 tablet 3  . cycloSPORINE (RESTASIS) 0.05 % ophthalmic emulsion Place 1 drop into both eyes 2 (two) times daily. 0.4 mL 5  . Diclofenac Sodium (PENNSAID) 1.5 % SOLN Place 0.2 mLs onto the skin 3 (three) times daily. 150 mL 11  . Empagliflozin-Metformin HCl (SYNJARDY) 01-999 MG TABS Take 1 tablet by mouth 2 (two) times daily. 60 tablet 5  . ferrous sulfate 325 (65 FE) MG tablet TAKE 1 TABLET BY MOUTH 2 TIMES DAILY WITH A MEAL. 180 tablet 2  . furosemide (LASIX) 40  MG tablet Take 80 mg by mouth 2 (two) times daily.    Marland Kitchen glucose blood (ONETOUCH VERIO) test strip Use to check blood sugar up to three times a day DX E10.29 300 each 1  . Insulin Detemir (LEVEMIR FLEXTOUCH) 100 UNIT/ML Pen Inject 240 Units into the skin every morning. 75 mL 5  . levothyroxine (SYNTHROID, LEVOTHROID) 50 MCG tablet TAKE 1 TABLET BY MOUTH DAILY BEFORE BREAKFAST. 90 tablet 3  . losartan (COZAAR) 25 MG tablet Take 2 tablets (50 mg total) by mouth daily. 180 tablet 3  . Multiple Vitamins-Minerals (ZINC PO) Take by mouth. Reported on 01/19/2016    . omeprazole (PRILOSEC) 40 MG capsule Take 1  tablet by mouth 30 min before breakfast. 90 capsule 3  . polyethylene glycol powder (GLYCOLAX/MIRALAX) powder TAKE 17 GRAMS BY MOUTH DAILY AS NEEDED FOR MILD CONSTIPATION. 527 g 11  . potassium chloride SA (K-DUR,KLOR-CON) 20 MEQ tablet TAKE 1 TABLET BY MOUTH DAILY. 30 tablet 0  . thiamine (VITAMIN B-1) 100 MG tablet Take 1 tablet (100 mg total) by mouth daily. 90 tablet 3  . ULTICARE SHORT PEN NEEDLES 31G X 8 MM MISC USE WITH INSULIN PEN DAILY. 100 each 3  . vitamin B-6 (PYRIDOXINE) 25 MG tablet Take 1 tablet (25 mg total) by mouth daily. 90 tablet 1  . vitamin C (ASCORBIC ACID) 500 MG tablet Take 1,000 mg by mouth 2 (two) times daily. Reported on 01/19/2016    . vitamin E 200 UNIT capsule Take 400 Units by mouth every morning. Reported on 01/19/2016    . zinc gluconate 50 MG tablet Take 1 tablet (50 mg total) by mouth daily. Reported on 01/19/2016 90 tablet 1  . HYDROcodone-acetaminophen (NORCO) 10-325 MG tablet Take 1 tablet by mouth every 8 (eight) hours as needed. (Patient not taking: Reported on 02/24/2017) 90 tablet 0   No current facility-administered medications for this encounter.     BP (!) 150/74   Pulse 89   Wt (!) 450 lb 8 oz (204.3 kg)   SpO2 95%   BMI 61.10 kg/m  General: NAD, morbidly obese.  Neck: Thick, JVP not elevated, no thyromegaly or thyroid nodule.  Lungs: Clear to auscultation bilaterally with normal respiratory effort. CV: Nondisplaced PMI.  Heart regular S1/S2, no S3/S4, 1/6 early SEM.  Trace ankle edema.  No carotid bruit.  Normal pedal pulses.  Abdomen: Soft, nontender, no hepatosplenomegaly, no distention.  Neurologic: Alert and oriented x 3.  Psych: Normal affect. Extremities: No clubbing or cyanosis.   Assessment/Plan: 1. Chronic diastolic CHF: He does not look volume overloaded on exam.  NYHA class III symptoms but suspect this is more due to morbid obesity and orthopedic issues.  Weight is down.  - Continue current Lasix, check BMET today.    2. Obesity:  Weight loss is imperative but will be difficult given his limited mobility.  He is going to try to get evaluated in the bariatrics program here, has history of prior lap banding.  3. HTN: BP high, increase losartan to 50 mg daily with BMET in 10 days.  4. Hyperlipidemia: Check lipids today.    Followup in 4-5 months.    [name] 02/25/2017

## 2017-02-27 ENCOUNTER — Encounter (HOSPITAL_COMMUNITY): Payer: Self-pay | Admitting: *Deleted

## 2017-03-07 ENCOUNTER — Other Ambulatory Visit: Payer: Self-pay | Admitting: Cardiology

## 2017-03-07 DIAGNOSIS — I10 Essential (primary) hypertension: Secondary | ICD-10-CM

## 2017-03-07 DIAGNOSIS — I5032 Chronic diastolic (congestive) heart failure: Secondary | ICD-10-CM

## 2017-03-10 ENCOUNTER — Inpatient Hospital Stay (HOSPITAL_COMMUNITY): Admission: RE | Admit: 2017-03-10 | Payer: Medicare HMO | Source: Ambulatory Visit

## 2017-03-13 DIAGNOSIS — M1610 Unilateral primary osteoarthritis, unspecified hip: Secondary | ICD-10-CM | POA: Diagnosis not present

## 2017-03-13 DIAGNOSIS — M150 Primary generalized (osteo)arthritis: Secondary | ICD-10-CM | POA: Diagnosis not present

## 2017-03-13 DIAGNOSIS — G4733 Obstructive sleep apnea (adult) (pediatric): Secondary | ICD-10-CM | POA: Diagnosis not present

## 2017-03-13 DIAGNOSIS — R609 Edema, unspecified: Secondary | ICD-10-CM | POA: Diagnosis not present

## 2017-03-13 DIAGNOSIS — J449 Chronic obstructive pulmonary disease, unspecified: Secondary | ICD-10-CM | POA: Diagnosis not present

## 2017-03-13 DIAGNOSIS — E139 Other specified diabetes mellitus without complications: Secondary | ICD-10-CM | POA: Diagnosis not present

## 2017-03-15 DIAGNOSIS — M150 Primary generalized (osteo)arthritis: Secondary | ICD-10-CM | POA: Diagnosis not present

## 2017-03-15 DIAGNOSIS — R609 Edema, unspecified: Secondary | ICD-10-CM | POA: Diagnosis not present

## 2017-03-15 DIAGNOSIS — G4733 Obstructive sleep apnea (adult) (pediatric): Secondary | ICD-10-CM | POA: Diagnosis not present

## 2017-03-15 DIAGNOSIS — E139 Other specified diabetes mellitus without complications: Secondary | ICD-10-CM | POA: Diagnosis not present

## 2017-03-15 DIAGNOSIS — I998 Other disorder of circulatory system: Secondary | ICD-10-CM | POA: Diagnosis not present

## 2017-03-15 DIAGNOSIS — E1165 Type 2 diabetes mellitus with hyperglycemia: Secondary | ICD-10-CM | POA: Diagnosis not present

## 2017-03-15 DIAGNOSIS — M1610 Unilateral primary osteoarthritis, unspecified hip: Secondary | ICD-10-CM | POA: Diagnosis not present

## 2017-03-15 DIAGNOSIS — J449 Chronic obstructive pulmonary disease, unspecified: Secondary | ICD-10-CM | POA: Diagnosis not present

## 2017-03-28 ENCOUNTER — Telehealth: Payer: Self-pay

## 2017-03-28 NOTE — Telephone Encounter (Signed)
Unable to leave message on patient's #, rings busy, busy and the home # has been disconnected.  Will try again tomorrow.

## 2017-03-28 NOTE — Telephone Encounter (Signed)
-----   Message from Gatha Mayer, MD sent at 03/25/2017  7:02 PM EDT ----- Regarding: follow-up He saw Nevin Bloodgood in May - didn't have testing done Please find out how his GI sxs are

## 2017-03-29 NOTE — Telephone Encounter (Signed)
Phone is still ringing busy, busy.  Unable to find his sister's # who is on his DPR .  Will try to reach him again tomorrow.

## 2017-03-30 DIAGNOSIS — E1142 Type 2 diabetes mellitus with diabetic polyneuropathy: Secondary | ICD-10-CM | POA: Diagnosis not present

## 2017-03-30 DIAGNOSIS — Z7409 Other reduced mobility: Secondary | ICD-10-CM | POA: Diagnosis not present

## 2017-03-30 DIAGNOSIS — M150 Primary generalized (osteo)arthritis: Secondary | ICD-10-CM | POA: Diagnosis not present

## 2017-03-30 DIAGNOSIS — R2689 Other abnormalities of gait and mobility: Secondary | ICD-10-CM | POA: Diagnosis not present

## 2017-03-30 DIAGNOSIS — R6889 Other general symptoms and signs: Secondary | ICD-10-CM | POA: Diagnosis not present

## 2017-03-30 DIAGNOSIS — M160 Bilateral primary osteoarthritis of hip: Secondary | ICD-10-CM | POA: Diagnosis not present

## 2017-04-04 ENCOUNTER — Other Ambulatory Visit: Payer: Self-pay | Admitting: Internal Medicine

## 2017-04-04 DIAGNOSIS — I10 Essential (primary) hypertension: Secondary | ICD-10-CM

## 2017-04-04 DIAGNOSIS — I5032 Chronic diastolic (congestive) heart failure: Secondary | ICD-10-CM

## 2017-04-05 NOTE — Telephone Encounter (Signed)
Attempted to reach patient and his phone just rings a fast busy.  Will try again in the AM.

## 2017-04-06 ENCOUNTER — Telehealth: Payer: Self-pay | Admitting: *Deleted

## 2017-04-06 ENCOUNTER — Other Ambulatory Visit: Payer: Self-pay | Admitting: Internal Medicine

## 2017-04-06 DIAGNOSIS — M150 Primary generalized (osteo)arthritis: Secondary | ICD-10-CM

## 2017-04-06 DIAGNOSIS — M159 Polyosteoarthritis, unspecified: Secondary | ICD-10-CM

## 2017-04-06 DIAGNOSIS — E1142 Type 2 diabetes mellitus with diabetic polyneuropathy: Secondary | ICD-10-CM

## 2017-04-06 DIAGNOSIS — M160 Bilateral primary osteoarthritis of hip: Secondary | ICD-10-CM

## 2017-04-06 MED ORDER — HYDROCODONE-ACETAMINOPHEN 10-325 MG PO TABS: 1.0000 | ORAL_TABLET | Freq: Three times a day (TID) | ORAL | 0 refills | Status: DC | PRN

## 2017-04-06 NOTE — Telephone Encounter (Signed)
Tried calling pt can not get through (phone system down) will place rx up front for pick-up today if pt call back checking status...[name]

## 2017-04-06 NOTE — Telephone Encounter (Signed)
Rec'd call pt requesting refill on his Hydrocodone.../lmb 

## 2017-04-06 NOTE — Telephone Encounter (Signed)
RX written 

## 2017-04-11 NOTE — Telephone Encounter (Signed)
Unable to reach patient by phone.  Mailed him a letter today to call us and to come get labs drawn.

## 2017-04-11 NOTE — Telephone Encounter (Signed)
Pt notified of Rx status

## 2017-04-14 ENCOUNTER — Other Ambulatory Visit: Payer: Self-pay | Admitting: Endocrinology

## 2017-04-14 LAB — HM DIABETES EYE EXAM

## 2017-04-19 ENCOUNTER — Other Ambulatory Visit: Payer: Self-pay

## 2017-04-19 MED ORDER — INSULIN DETEMIR 100 UNIT/ML FLEXPEN: 240.0000 [IU] | PEN_INJECTOR | SUBCUTANEOUS | 5 refills | Status: DC

## 2017-04-20 DIAGNOSIS — Z947 Corneal transplant status: Secondary | ICD-10-CM | POA: Insufficient documentation

## 2017-04-20 DIAGNOSIS — E113532 Type 2 diabetes mellitus with proliferative diabetic retinopathy with traction retinal detachment not involving the macula, left eye: Secondary | ICD-10-CM | POA: Insufficient documentation

## 2017-04-20 DIAGNOSIS — E113521 Type 2 diabetes mellitus with proliferative diabetic retinopathy with traction retinal detachment involving the macula, right eye: Secondary | ICD-10-CM | POA: Insufficient documentation

## 2017-04-20 DIAGNOSIS — H4313 Vitreous hemorrhage, bilateral: Secondary | ICD-10-CM | POA: Insufficient documentation

## 2017-04-20 DIAGNOSIS — H25813 Combined forms of age-related cataract, bilateral: Secondary | ICD-10-CM | POA: Diagnosis not present

## 2017-05-01 ENCOUNTER — Ambulatory Visit (INDEPENDENT_AMBULATORY_CARE_PROVIDER_SITE_OTHER): Payer: Medicare HMO | Admitting: Internal Medicine

## 2017-05-01 ENCOUNTER — Encounter: Payer: Self-pay | Admitting: Internal Medicine

## 2017-05-01 ENCOUNTER — Other Ambulatory Visit (INDEPENDENT_AMBULATORY_CARE_PROVIDER_SITE_OTHER): Payer: Medicare HMO

## 2017-05-01 VITALS — BP 132/66 | HR 72 | Temp 98.1°F | Resp 16 | Ht 72.0 in | Wt >= 6400 oz

## 2017-05-01 DIAGNOSIS — I10 Essential (primary) hypertension: Secondary | ICD-10-CM

## 2017-05-01 DIAGNOSIS — R21 Rash and other nonspecific skin eruption: Secondary | ICD-10-CM

## 2017-05-01 DIAGNOSIS — D508 Other iron deficiency anemias: Secondary | ICD-10-CM | POA: Diagnosis not present

## 2017-05-01 DIAGNOSIS — E118 Type 2 diabetes mellitus with unspecified complications: Secondary | ICD-10-CM

## 2017-05-01 DIAGNOSIS — E519 Thiamine deficiency, unspecified: Secondary | ICD-10-CM

## 2017-05-01 DIAGNOSIS — E039 Hypothyroidism, unspecified: Secondary | ICD-10-CM

## 2017-05-01 DIAGNOSIS — R1310 Dysphagia, unspecified: Secondary | ICD-10-CM | POA: Diagnosis not present

## 2017-05-01 LAB — URINALYSIS, ROUTINE W REFLEX MICROSCOPIC
Bilirubin Urine: NEGATIVE
Hgb urine dipstick: NEGATIVE
Ketones, ur: NEGATIVE
LEUKOCYTES UA: NEGATIVE
Nitrite: NEGATIVE
RBC / HPF: NONE SEEN (ref 0–?)
SPECIFIC GRAVITY, URINE: 1.01 (ref 1.000–1.030)
TOTAL PROTEIN, URINE-UPE24: NEGATIVE
UROBILINOGEN UA: 0.2 (ref 0.0–1.0)
WBC, UA: NONE SEEN (ref 0–?)
pH: 6 (ref 5.0–8.0)

## 2017-05-01 LAB — CBC WITH DIFFERENTIAL/PLATELET
BASOS ABS: 0 10*3/uL (ref 0.0–0.1)
Basophils Relative: 0.5 % (ref 0.0–3.0)
EOS ABS: 0.3 10*3/uL (ref 0.0–0.7)
Eosinophils Relative: 3.4 % (ref 0.0–5.0)
HCT: 33.9 % — ABNORMAL LOW (ref 39.0–52.0)
Hemoglobin: 10.7 g/dL — ABNORMAL LOW (ref 13.0–17.0)
LYMPHS ABS: 1.9 10*3/uL (ref 0.7–4.0)
LYMPHS PCT: 24.8 % (ref 12.0–46.0)
MCHC: 31.6 g/dL (ref 30.0–36.0)
MCV: 90.1 fl (ref 78.0–100.0)
MONOS PCT: 5.1 % (ref 3.0–12.0)
Monocytes Absolute: 0.4 10*3/uL (ref 0.1–1.0)
NEUTROS PCT: 66.2 % (ref 43.0–77.0)
Neutro Abs: 5 10*3/uL (ref 1.4–7.7)
Platelets: 260 10*3/uL (ref 150.0–400.0)
RBC: 3.76 Mil/uL — AB (ref 4.22–5.81)
RDW: 14.9 % (ref 11.5–15.5)
WBC: 7.6 10*3/uL (ref 4.0–10.5)

## 2017-05-01 LAB — COMPREHENSIVE METABOLIC PANEL
ALK PHOS: 110 U/L (ref 39–117)
ALT: 18 U/L (ref 0–53)
AST: 20 U/L (ref 0–37)
Albumin: 3.8 g/dL (ref 3.5–5.2)
BUN: 38 mg/dL — ABNORMAL HIGH (ref 6–23)
CALCIUM: 9 mg/dL (ref 8.4–10.5)
CO2: 28 mEq/L (ref 19–32)
Chloride: 104 mEq/L (ref 96–112)
Creatinine, Ser: 2.15 mg/dL — ABNORMAL HIGH (ref 0.40–1.50)
GFR: 40.6 mL/min — ABNORMAL LOW (ref >60.00)
Glucose, Bld: 79 mg/dL (ref 70–99)
Potassium: 4.4 mEq/L (ref 3.5–5.1)
Sodium: 142 mEq/L (ref 135–145)
TOTAL PROTEIN: 7.5 g/dL (ref 6.0–8.3)
Total Bilirubin: 0.3 mg/dL (ref 0.2–1.2)

## 2017-05-01 LAB — TSH: TSH: 2.77 u[IU]/mL (ref 0.35–4.50)

## 2017-05-01 LAB — SEDIMENTATION RATE

## 2017-05-01 LAB — MICROALBUMIN / CREATININE URINE RATIO
Creatinine,U: 103.5 mg/dL
Microalb Creat Ratio: 0.7 mg/g (ref 0.0–30.0)
Microalb, Ur: <0.7 mg/dL (ref 0.0–1.9)

## 2017-05-01 LAB — HEMOGLOBIN A1C: HEMOGLOBIN A1C: 8.7 % — AB (ref 4.6–6.5)

## 2017-05-01 NOTE — Progress Notes (Signed)
Subjective:  Patient ID: [name], male    DOB: 17-Apr-1956  Age: 61 y.o. MRN: [id number]  CC: Rash   HPI [name] presents for concerns about a rash on the soles of his feet. He did know the rash was there until 1 of his caretakers took a picture of the bottoms of his feet and showed it to him. He sees brown and black spots on the bottoms of his feet. He also complains of a one-month history of feeling like food is getting stuck in his esophagus. He says he drinks liquid to make the food pass. He did have an episode recently where some food got stuck, he drank some liquid and then had to regurgitate the liquid to make the sensation go away. He has not noticed odynophagia, loss of appetite, or weight loss.  Outpatient Medications Prior to Visit  Medication Sig Dispense Refill  . aspirin EC 325 MG tablet Take 81 mg by mouth every evening.     Marland Kitchen atorvastatin (LIPITOR) 40 MG tablet TAKE 1 TABLET BY MOUTH DAILY. 90 tablet 3  . bisacodyl (DULCOLAX) 5 MG EC tablet Take 5 mg by mouth daily as needed for moderate constipation.    . bisoprolol (ZEBETA) 10 MG tablet TAKE 1 TABLET (10 MG TOTAL) BY MOUTH EVERY EVENING. 90 tablet 3  . Cholecalciferol 2000 units TABS Take 1 tablet (2,000 Units total) by mouth daily. 90 tablet 3  . cycloSPORINE (RESTASIS) 0.05 % ophthalmic emulsion Place 1 drop into both eyes 2 (two) times daily. 0.4 mL 5  . Diclofenac Sodium (PENNSAID) 1.5 % SOLN Place 0.2 mLs onto the skin 3 (three) times daily. 150 mL 11  . Empagliflozin-Metformin HCl (SYNJARDY) 01-999 MG TABS Take 1 tablet by mouth 2 (two) times daily. 60 tablet 5  . ferrous sulfate 325 (65 FE) MG tablet TAKE 1 TABLET BY MOUTH 2 TIMES DAILY WITH A MEAL. 180 tablet 2  . furosemide (LASIX) 40 MG tablet Take 80 mg by mouth 2 (two) times daily.    . furosemide (LASIX) 80 MG tablet Take 1 tablet (80 mg total) by mouth 2 (two) times daily. 60 tablet 3  . glucose blood (ONETOUCH VERIO) test strip Use to check blood  sugar up to three times a day DX E10.29 300 each 1  . HYDROcodone-acetaminophen (NORCO) 10-325 MG tablet Take 1 tablet by mouth every 8 (eight) hours as needed. 90 tablet 0  . Insulin Detemir (LEVEMIR FLEXTOUCH) 100 UNIT/ML Pen Inject 240 Units into the skin every morning. 75 mL 5  . levothyroxine (SYNTHROID, LEVOTHROID) 50 MCG tablet TAKE 1 TABLET BY MOUTH DAILY BEFORE BREAKFAST. 90 tablet 3  . losartan (COZAAR) 25 MG tablet Take 2 tablets (50 mg total) by mouth daily. 180 tablet 3  . Multiple Vitamins-Minerals (ZINC PO) Take by mouth. Reported on 01/19/2016    . omeprazole (PRILOSEC) 40 MG capsule Take 1 tablet by mouth 30 min before breakfast. 90 capsule 3  . polyethylene glycol powder (GLYCOLAX/MIRALAX) powder TAKE 17 GRAMS BY MOUTH DAILY AS NEEDED FOR MILD CONSTIPATION. 527 g 11  . potassium chloride SA (K-DUR,KLOR-CON) 20 MEQ tablet TAKE 1 TABLET BY MOUTH DAILY. 30 tablet 3  . thiamine (VITAMIN B-1) 100 MG tablet Take 1 tablet (100 mg total) by mouth daily. 90 tablet 3  . ULTICARE SHORT PEN NEEDLES 31G X 8 MM MISC USE WITH INSULIN PEN DAILY. 100 each 3  . vitamin B-6 (PYRIDOXINE) 25 MG tablet Take 1 tablet (25 mg total)  by mouth daily. 90 tablet 1  . vitamin C (ASCORBIC ACID) 500 MG tablet Take 1,000 mg by mouth 2 (two) times daily. Reported on 01/19/2016    . vitamin E 200 UNIT capsule Take 400 Units by mouth every morning. Reported on 01/19/2016    . zinc gluconate 50 MG tablet Take 1 tablet (50 mg total) by mouth daily. Reported on 01/19/2016 90 tablet 1   No facility-administered medications prior to visit.     ROS Review of Systems  Constitutional: Negative.  Negative for appetite change, diaphoresis, fatigue and unexpected weight change.  HENT: Positive for trouble swallowing. Negative for sore throat and voice change.   Eyes: Negative for visual disturbance.  Respiratory: Positive for choking. Negative for cough, chest tightness, shortness of breath and wheezing.   Cardiovascular:  Negative for chest pain, palpitations and leg swelling.  Gastrointestinal: Negative for abdominal pain, blood in stool, constipation, diarrhea, nausea and vomiting.  Endocrine: Negative.   Genitourinary: Negative.  Negative for dysuria, hematuria and urgency.  Musculoskeletal: Negative for arthralgias, back pain, myalgias and neck pain.  Skin: Positive for rash. Negative for color change.  Allergic/Immunologic: Negative.   Neurological: Negative for dizziness and weakness.  Hematological: Negative for adenopathy. Does not bruise/bleed easily.  Psychiatric/Behavioral: Negative.     Objective:  BP 132/66 (BP Location: Left Arm, Patient Position: Sitting, Cuff Size: Large)   Pulse 72   Temp 98.1 F (36.7 C) (Oral)   Resp 16   Ht 6' (1.829 m)   Wt (!) 488 lb 8 oz (221.6 kg)   SpO2 97%   BMI 66.25 kg/m   BP Readings from Last 3 Encounters:  05/01/17 132/66  02/24/17 (!) 150/74  02/03/17 128/70    Wt Readings from Last 3 Encounters:  05/01/17 (!) 488 lb 8 oz (221.6 kg)  02/24/17 (!) 450 lb 8 oz (204.3 kg)  02/03/17 (!) 472 lb (214.1 kg)    Physical Exam  Constitutional: He is oriented to person, place, and time. No distress.  HENT:  Mouth/Throat: Oropharynx is clear and moist. No oropharyngeal exudate.  Eyes: Conjunctivae are normal. Right eye exhibits no discharge. Left eye exhibits no discharge. No scleral icterus.  Neck: Normal range of motion. Neck supple. No JVD present. No thyromegaly present.  Cardiovascular: Normal rate, regular rhythm and intact distal pulses.  Exam reveals no gallop and no friction rub.   No murmur heard. Pulmonary/Chest: Effort normal and breath sounds normal. No stridor. No respiratory distress. He has no wheezes. He has no rales. He exhibits no tenderness.  Abdominal: Soft. Bowel sounds are normal. He exhibits no distension and no mass. There is no tenderness. There is no rebound and no guarding.  Musculoskeletal: Normal range of motion. He  exhibits no edema, tenderness or deformity.  Lymphadenopathy:    He has no cervical adenopathy.  Neurological: He is alert and oriented to person, place, and time.  Skin: Skin is warm and dry. Rash noted. He is not diaphoretic. No erythema. No pallor.  The soles of his feet show numerous hyperpigmented macules that are brown to black. They vary in size but all are less than 1 cm. There are no lesions on the palms. There are no other epidermal lesions noted.  Vitals reviewed.   Lab Results  Component Value Date   WBC 7.6 05/01/2017   HGB 10.7 (L) 05/01/2017   HCT 33.9 (L) 05/01/2017   PLT 260.0 05/01/2017   GLUCOSE 79 05/01/2017   CHOL 133 02/24/2017  TRIG 111 02/24/2017   HDL 27 (L) 02/24/2017   LDLDIRECT 136.8 12/12/2006   LDLCALC 84 02/24/2017   ALT 18 05/01/2017   AST 20 05/01/2017   NA 142 05/01/2017   K 4.4 05/01/2017   CL 104 05/01/2017   CREATININE 2.15 (H) 05/01/2017   BUN 38 (H) 05/01/2017   CO2 28 05/01/2017   TSH 2.77 05/01/2017   PSA 1.29 08/02/2016   HGBA1C 8.7 (H) 05/01/2017   MICROALBUR <0.7 05/01/2017    No results found.  Assessment & Plan:   Diyari was seen today for rash.  Diagnoses and all orders for this visit:  Other iron deficiency anemia- H&H has improved some. We'll continue iron replacement therapy. There is also some component of anemia of chronic disease. -     CBC with Differential/Platelet; Future  Thiamine deficiency- his H&H have improved some. We'll continue thiamine replacement therapy. -     CBC with Differential/Platelet; Future  Acquired hypothyroidism- his TSH is in the normal range, he will remain on the current dose of levothyroxine. -     TSH; Future  Type 2 diabetes mellitus with complication, without long-term current use of insulin (HCC) -     Comprehensive metabolic panel; Future -     Microalbumin / creatinine urine ratio; Future -     Hemoglobin A1c; Future  Essential hypertension- his blood pressures well  controlled. He has a mild increase in his BUN and creatinine consistent with the prerenal state caused by the loop diuretics. I do think he needs to continue the current dose to prevent fluid overload. -     Comprehensive metabolic panel; Future -     Urinalysis, Routine w reflex microscopic; Future  Dysphagia, unspecified type- I've asked him to see GI to see if he needs upper endoscopy to screen for pathology such as strictures, esophageal ulcers, esophagitis, etc. -     Ambulatory referral to Gastroenterology  Rash- rash is nonspecific and asymptomatic but he does have an elevated sedimentation rate. I've asked him to see dermatology to see if he needs to be evaluated for vasculitis. Screening for hepatitis C, syphilis, and HIV are all negative. -     Sedimentation rate; Future -     RPR; Future -     HIV antibody (with reflex) -     Hepatitis C antibody; Future -     Ambulatory referral to Dermatology   I am having Mr. Buckles maintain his vitamin C, vitamin E, aspirin EC, cycloSPORINE, Multiple Vitamins-Minerals (ZINC PO), Diclofenac Sodium, bisacodyl, glucose blood, polyethylene glycol powder, thiamine, levothyroxine, bisoprolol, ferrous sulfate, ULTICARE SHORT PEN NEEDLES, Empagliflozin-Metformin HCl, Cholecalciferol, vitamin B-6, zinc gluconate, atorvastatin, omeprazole, furosemide, losartan, furosemide, potassium chloride SA, HYDROcodone-acetaminophen, and Insulin Detemir.  No orders of the defined types were placed in this encounter.    Follow-up: Return in about 4 weeks (around 05/29/2017).  Scarlette Calico, MD

## 2017-05-01 NOTE — Patient Instructions (Signed)
Rash A rash is a change in the color of the skin. A rash can also change the way your skin feels. There are many different conditions and factors that can cause a rash. Follow these instructions at home: Pay attention to any changes in your symptoms. Follow these instructions to help with your condition: Medicine Take or apply over-the-counter and prescription medicines only as told by your health care provider. These may include:  Corticosteroid cream.  Anti-itch lotions.  Oral antihistamines.  Skin Care  Apply cool compresses to the affected areas.  Try taking a bath with: ? Epsom salts. Follow the instructions on the packaging. You can get these at your local pharmacy or grocery store. ? Baking soda. Pour a small amount into the bath as told by your health care provider. ? Colloidal oatmeal. Follow the instructions on the packaging. You can get this at your local pharmacy or grocery store.  Try applying baking soda paste to your skin. Stir water into baking soda until it reaches a paste-like consistency.  Do not scratch or rub your skin.  Avoid covering the rash. Make sure the rash is exposed to air as much as possible. General instructions  Avoid hot showers or baths, which can make itching worse. A cold shower may help.  Avoid scented soaps, detergents, and perfumes. Use gentle soaps, detergents, perfumes, and other cosmetic products.  Avoid any substance that causes your rash. Keep a journal to help track what causes your rash. Write down: ? What you eat. ? What cosmetic products you use. ? What you drink. ? What you wear. This includes jewelry.  Keep all follow-up visits as told by your health care provider. This is important. Contact a health care provider if:  You sweat at night.  You lose weight.  You urinate more than normal.  You feel weak.  You vomit.  Your skin or the whites of your eyes look yellow (jaundice).  Your skin: ? Tingles. ? Is  numb.  Your rash: ? Does not go away after several days. ? Gets worse.  You are: ? Unusually thirsty. ? More tired than normal.  You have: ? New symptoms. ? Pain in your abdomen. ? A fever. ? Diarrhea. Get help right away if:  You develop a rash that covers all or most of your body. The rash may or may not be painful.  You develop blisters that: ? Are on top of the rash. ? Grow larger or grow together. ? Are painful. ? Are inside your nose or mouth.  You develop a rash that: ? Looks like purple pinprick-sized spots all over your body. ? Has a "bull's eye" or looks like a target. ? Is not related to sun exposure, is red and painful, and causes your skin to peel. This information is not intended to replace advice given to you by your health care provider. Make sure you discuss any questions you have with your health care provider. Document Released: 08/26/2002 Document Revised: 02/09/2016 Document Reviewed: 01/21/2015 Elsevier Interactive Patient Education  2017 Elsevier Inc.  

## 2017-05-02 LAB — HEPATITIS C ANTIBODY: HCV Ab: NONREACTIVE

## 2017-05-02 LAB — RPR

## 2017-05-08 ENCOUNTER — Telehealth: Payer: Self-pay | Admitting: Internal Medicine

## 2017-05-08 NOTE — Telephone Encounter (Signed)
LVM x2 on 8/17 LVM on 8/20  We have received forms about power wheelchair. Needing to speak with patient about forms, if he needs these filled out. May speak with [place] or [place].

## 2017-05-08 NOTE — Telephone Encounter (Signed)
Forms are completed. Will wait to give to PCP.

## 2017-05-09 DIAGNOSIS — Z0279 Encounter for issue of other medical certificate: Secondary | ICD-10-CM

## 2017-05-09 NOTE — Telephone Encounter (Signed)
Forms have completed and signed, Faxed 8/21, Copy sent to scan &Charged for. 8/21 BO

## 2017-05-11 NOTE — Telephone Encounter (Signed)
Brooke from [employer] called stating that they received the Therapist Evaluation but the signature page was missing. Can this be resent?

## 2017-05-16 NOTE — Telephone Encounter (Addendum)
This has been refaxed 

## 2017-05-17 ENCOUNTER — Telehealth: Payer: Self-pay | Admitting: Internal Medicine

## 2017-05-17 ENCOUNTER — Other Ambulatory Visit: Payer: Self-pay | Admitting: Internal Medicine

## 2017-05-17 DIAGNOSIS — M150 Primary generalized (osteo)arthritis: Secondary | ICD-10-CM

## 2017-05-17 DIAGNOSIS — E1142 Type 2 diabetes mellitus with diabetic polyneuropathy: Secondary | ICD-10-CM

## 2017-05-17 DIAGNOSIS — M160 Bilateral primary osteoarthritis of hip: Secondary | ICD-10-CM

## 2017-05-17 DIAGNOSIS — M159 Polyosteoarthritis, unspecified: Secondary | ICD-10-CM

## 2017-05-17 MED ORDER — HYDROCODONE-ACETAMINOPHEN 10-325 MG PO TABS: 1.0000 | ORAL_TABLET | Freq: Three times a day (TID) | ORAL | 0 refills | Status: DC | PRN

## 2017-05-17 NOTE — Telephone Encounter (Signed)
RX done. 

## 2017-05-17 NOTE — Telephone Encounter (Signed)
Check [facility] registry last filled 04/14/2017.Marland Kitchen/LMB

## 2017-05-17 NOTE — Telephone Encounter (Signed)
Notified pt rx ready for pick-up.../lmb 

## 2017-05-17 NOTE — Telephone Encounter (Signed)
Pt called requesting a refill on HYDROcodone-acetaminophen (NORCO) 10-325 MG tablet. He would like a call when it is ready to be picked up.

## 2017-05-18 ENCOUNTER — Telehealth: Payer: Self-pay | Admitting: Internal Medicine

## 2017-05-18 MED ORDER — GLUCOSE BLOOD VI STRP: ORAL_STRIP | 5 refills | Status: DC

## 2017-05-18 NOTE — Telephone Encounter (Signed)
Resent rx to [facility].Marland KitchenJohny Chess

## 2017-05-18 NOTE — Telephone Encounter (Signed)
Pt called stating [facility] could not fill his ONETOUCH VERIO test strip  Because I diagnostic code was not sent with the script.  Please advise and resend

## 2017-05-19 MED ORDER — GLUCOSE BLOOD VI STRP: ORAL_STRIP | 5 refills | Status: DC

## 2017-05-19 NOTE — Telephone Encounter (Signed)
Resent rx to [facility]../lmb 

## 2017-05-19 NOTE — Addendum Note (Signed)
Addended by: Earnstine [name] on: 05/19/2017 09:32 AM   Modules accepted: Orders

## 2017-05-19 NOTE — Telephone Encounter (Signed)
Pt said that this should have been sent to [facility] ([place]). I read him the message from 8/30 and he told me that he said they could fill it. This prescription should be sent to [facility] on [address].

## 2017-05-20 HISTORY — PX: EYE SURGERY: SHX253

## 2017-05-29 DIAGNOSIS — E113521 Type 2 diabetes mellitus with proliferative diabetic retinopathy with traction retinal detachment involving the macula, right eye: Secondary | ICD-10-CM | POA: Diagnosis not present

## 2017-05-29 DIAGNOSIS — H52213 Irregular astigmatism, bilateral: Secondary | ICD-10-CM | POA: Diagnosis not present

## 2017-05-29 DIAGNOSIS — H17823 Peripheral opacity of cornea, bilateral: Secondary | ICD-10-CM | POA: Diagnosis not present

## 2017-06-05 DIAGNOSIS — H3342 Traction detachment of retina, left eye: Secondary | ICD-10-CM | POA: Diagnosis not present

## 2017-06-05 DIAGNOSIS — E1121 Type 2 diabetes mellitus with diabetic nephropathy: Secondary | ICD-10-CM | POA: Diagnosis not present

## 2017-06-05 DIAGNOSIS — H2511 Age-related nuclear cataract, right eye: Secondary | ICD-10-CM | POA: Diagnosis not present

## 2017-06-05 DIAGNOSIS — H25813 Combined forms of age-related cataract, bilateral: Secondary | ICD-10-CM | POA: Diagnosis not present

## 2017-06-05 DIAGNOSIS — E113521 Type 2 diabetes mellitus with proliferative diabetic retinopathy with traction retinal detachment involving the macula, right eye: Secondary | ICD-10-CM | POA: Diagnosis not present

## 2017-06-05 DIAGNOSIS — E1136 Type 2 diabetes mellitus with diabetic cataract: Secondary | ICD-10-CM | POA: Diagnosis not present

## 2017-06-05 DIAGNOSIS — E11621 Type 2 diabetes mellitus with foot ulcer: Secondary | ICD-10-CM | POA: Diagnosis not present

## 2017-06-05 DIAGNOSIS — E1165 Type 2 diabetes mellitus with hyperglycemia: Secondary | ICD-10-CM | POA: Diagnosis not present

## 2017-06-05 DIAGNOSIS — H4311 Vitreous hemorrhage, right eye: Secondary | ICD-10-CM | POA: Diagnosis not present

## 2017-06-05 DIAGNOSIS — I110 Hypertensive heart disease with heart failure: Secondary | ICD-10-CM | POA: Diagnosis not present

## 2017-06-05 DIAGNOSIS — E113531 Type 2 diabetes mellitus with proliferative diabetic retinopathy with traction retinal detachment not involving the macula, right eye: Secondary | ICD-10-CM | POA: Diagnosis not present

## 2017-06-05 DIAGNOSIS — E1142 Type 2 diabetes mellitus with diabetic polyneuropathy: Secondary | ICD-10-CM | POA: Diagnosis not present

## 2017-06-06 DIAGNOSIS — E113532 Type 2 diabetes mellitus with proliferative diabetic retinopathy with traction retinal detachment not involving the macula, left eye: Secondary | ICD-10-CM | POA: Diagnosis not present

## 2017-06-06 DIAGNOSIS — E113521 Type 2 diabetes mellitus with proliferative diabetic retinopathy with traction retinal detachment involving the macula, right eye: Secondary | ICD-10-CM | POA: Diagnosis not present

## 2017-06-06 DIAGNOSIS — I509 Heart failure, unspecified: Secondary | ICD-10-CM | POA: Diagnosis not present

## 2017-06-06 DIAGNOSIS — H4313 Vitreous hemorrhage, bilateral: Secondary | ICD-10-CM | POA: Diagnosis not present

## 2017-06-06 DIAGNOSIS — Z48810 Encounter for surgical aftercare following surgery on the sense organs: Secondary | ICD-10-CM | POA: Diagnosis not present

## 2017-06-06 DIAGNOSIS — E1136 Type 2 diabetes mellitus with diabetic cataract: Secondary | ICD-10-CM | POA: Diagnosis not present

## 2017-06-06 DIAGNOSIS — Z794 Long term (current) use of insulin: Secondary | ICD-10-CM | POA: Diagnosis not present

## 2017-06-06 DIAGNOSIS — E785 Hyperlipidemia, unspecified: Secondary | ICD-10-CM | POA: Diagnosis not present

## 2017-06-06 DIAGNOSIS — I110 Hypertensive heart disease with heart failure: Secondary | ICD-10-CM | POA: Diagnosis not present

## 2017-06-12 ENCOUNTER — Telehealth: Payer: Self-pay | Admitting: Internal Medicine

## 2017-06-12 NOTE — Telephone Encounter (Signed)
Pt called in and said that he had eye surgery and he has a headache.  He is has been taking hydrocodone and he said that it is not helping at all.  He is wanting to know what else he can do?

## 2017-06-13 NOTE — Telephone Encounter (Signed)
Pt called in again this morning said that he thinks he has a cold, he is coughing and that is what is causing him to have a headache

## 2017-06-29 NOTE — Telephone Encounter (Signed)
Pt called upset because he has not heard anything in 3 weeks, states his cold has gotten worse. I offered him to come in for an appointment tomorrow and he said he only wanted to see Dr. [name]. I informed him of his absence and states this is crazy yall have done nothing for me. I eventually made the pt an appointment with Dr. Jenny Reichmann but he was seemingly upset he has not heard anything.

## 2017-06-30 ENCOUNTER — Ambulatory Visit (INDEPENDENT_AMBULATORY_CARE_PROVIDER_SITE_OTHER): Payer: Medicare HMO | Admitting: Internal Medicine

## 2017-06-30 ENCOUNTER — Encounter: Payer: Self-pay | Admitting: Internal Medicine

## 2017-06-30 VITALS — BP 126/78 | HR 81 | Temp 98.0°F | Ht 72.0 in | Wt >= 6400 oz

## 2017-06-30 DIAGNOSIS — E1142 Type 2 diabetes mellitus with diabetic polyneuropathy: Secondary | ICD-10-CM | POA: Diagnosis not present

## 2017-06-30 DIAGNOSIS — R059 Cough, unspecified: Secondary | ICD-10-CM

## 2017-06-30 DIAGNOSIS — G4700 Insomnia, unspecified: Secondary | ICD-10-CM | POA: Diagnosis not present

## 2017-06-30 DIAGNOSIS — R05 Cough: Secondary | ICD-10-CM

## 2017-06-30 DIAGNOSIS — R062 Wheezing: Secondary | ICD-10-CM | POA: Diagnosis not present

## 2017-06-30 DIAGNOSIS — M159 Polyosteoarthritis, unspecified: Secondary | ICD-10-CM

## 2017-06-30 DIAGNOSIS — M150 Primary generalized (osteo)arthritis: Secondary | ICD-10-CM

## 2017-06-30 MED ORDER — HYDROCODONE-ACETAMINOPHEN 10-325 MG PO TABS: 1.0000 | ORAL_TABLET | Freq: Three times a day (TID) | ORAL | 0 refills | Status: DC | PRN

## 2017-06-30 MED ORDER — ALBUTEROL SULFATE HFA 108 (90 BASE) MCG/ACT IN AERS: 2.0000 | INHALATION_SPRAY | Freq: Four times a day (QID) | RESPIRATORY_TRACT | 1 refills | Status: DC | PRN

## 2017-06-30 MED ORDER — METHYLPREDNISOLONE ACETATE 80 MG/ML IJ SUSP
80.0000 mg | Freq: Once | INTRAMUSCULAR | Status: AC
  Administered 2017-06-30: 80 mg via INTRAMUSCULAR

## 2017-06-30 MED ORDER — LEVOFLOXACIN 500 MG PO TABS: 500.0000 mg | ORAL_TABLET | Freq: Every day | ORAL | 0 refills | Status: DC

## 2017-06-30 MED ORDER — PREDNISONE 10 MG PO TABS: ORAL_TABLET | ORAL | 0 refills | Status: DC

## 2017-06-30 MED ORDER — ZOLPIDEM TARTRATE 10 MG PO TABS: 10.0000 mg | ORAL_TABLET | Freq: Every evening | ORAL | 0 refills | Status: DC | PRN

## 2017-06-30 MED ORDER — BENZONATATE 100 MG PO CAPS: ORAL_CAPSULE | ORAL | 0 refills | Status: DC

## 2017-06-30 NOTE — Progress Notes (Signed)
Subjective:    Patient ID: [name], male    DOB: 11/22/1955, 61 y.o.   MRN: [id number]  HPI  Here with acute onset mild to mod 7 days ST, HA, general weakness and malaise, with prod cough greenish sputum, but Pt denies chest pain, increased sob or doe, wheezing, orthopnea, PND, increased LE swelling, palpitations, dizziness or syncope, except for onset mild wheezing and sob x 2 days just not getting better.  Pt denies new neurological symptoms such as new headache, or facial or extremity weakness or numbness   Pt denies polydipsia, polyuria, in fact states sugars are in low 100's currently.  Good compliance with CPAP but still unable to sleep well, last night tried to sleep but has essentially been up from 10 pm last evening to now.  Did have catnaps during day.  Also walking with walking stick, c/o persistent pain, asks for refill chronic narcotic as has been rx per PCP (not here today) Past Medical History:  Diagnosis Date  . Anemia   . CHF (congestive heart failure) (HCC)    Presumed diastolic. Echo (06/07) w.EF 45%, severe posterior HK, mild LV hypertrophy, No further work-up of abnormal echo was done.  . Colon cancer [facility]) 2007   s/p sigmoid colectomy  . Diabetes mellitus    Has Hx of diabetic foot ulcer & peripheral neuropathy  . History of PFTs 05/2009   Mild Obstructive defect  . HTN (hypertension)   . Hyperlipidemia   . Morbid obesity ([place])   . OSA on CPAP    Past Surgical History:  Procedure Laterality Date  . BARIATRIC SURGERY  12/2009   Lap Band/At [facility]  . COLONOSCOPY  multiple  . COLONOSCOPY WITH PROPOFOL N/A 11/26/2012   Procedure: COLONOSCOPY WITH PROPOFOL;  Surgeon: Gatha Mayer, MD;  Location: [facility];  Service: Endoscopy;  Laterality: N/A;  may need pre appt. with anesthesia due to morbid obesity  . [place]   '80/Right  '84/Left  . EYE SURGERY    . HERNIA REPAIR    . HIP SURGERY     bilateral  . INCISIONAL HERNIA REPAIR N/A  12/18/2013   Procedure:  REPAIR OF INCARCERATED INCISIONAL HERNIA;  Surgeon: Rolm Bookbinder, MD;  Location: [place];  Service: General;  Laterality: N/A;  . INSERTION OF MESH N/A 12/18/2013   Procedure: INSERTION OF MESH;  Surgeon: Rolm Bookbinder, MD;  Location: [place];  Service: General;  Laterality: N/A;  . Sigmoid Colectomy  10/2005   [facility]  . TONSILLECTOMY      reports that he has never smoked. He has never used smokeless tobacco. He reports that he does not drink alcohol or use drugs. family history includes CAD in his mother; Congestive Heart Failure in his mother; Diabetes in his father and mother; Heart attack (age of onset: 52) in his brother; Heart failure in his brother; Hepatitis in his mother. Allergies  Allergen Reactions  . Shellfish Allergy Anaphylaxis  . Lisinopril Cough   Current Outpatient Prescriptions on File Prior to Visit  Medication Sig Dispense Refill  . aspirin EC 325 MG tablet Take 81 mg by mouth every evening.     Marland Kitchen atorvastatin (LIPITOR) 40 MG tablet TAKE 1 TABLET BY MOUTH DAILY. 90 tablet 3  . bisacodyl (DULCOLAX) 5 MG EC tablet Take 5 mg by mouth daily as needed for moderate constipation.    . bisoprolol (ZEBETA) 10 MG tablet TAKE 1 TABLET (10 MG TOTAL) BY MOUTH EVERY EVENING.  90 tablet 3  . Cholecalciferol 2000 units TABS Take 1 tablet (2,000 Units total) by mouth daily. 90 tablet 3  . cycloSPORINE (RESTASIS) 0.05 % ophthalmic emulsion Place 1 drop into both eyes 2 (two) times daily. 0.4 mL 5  . Diclofenac Sodium (PENNSAID) 1.5 % SOLN Place 0.2 mLs onto the skin 3 (three) times daily. 150 mL 11  . Empagliflozin-Metformin HCl (SYNJARDY) 01-999 MG TABS Take 1 tablet by mouth 2 (two) times daily. 60 tablet 5  . ferrous sulfate 325 (65 FE) MG tablet TAKE 1 TABLET BY MOUTH 2 TIMES DAILY WITH A MEAL. 180 tablet 2  . furosemide (LASIX) 40 MG tablet Take 80 mg by mouth 2 (two) times daily.    . furosemide (LASIX) 80 MG tablet Take 1 tablet (80 mg total) by mouth 2  (two) times daily. 60 tablet 3  . glucose blood (ONETOUCH VERIO) test strip USE  STRIP TO CHECK GLUCOSE THREE TIMES DAILY 100 each 5  . Insulin Detemir (LEVEMIR FLEXTOUCH) 100 UNIT/ML Pen Inject 240 Units into the skin every morning. 75 mL 5  . levothyroxine (SYNTHROID, LEVOTHROID) 50 MCG tablet TAKE 1 TABLET BY MOUTH DAILY BEFORE BREAKFAST. 90 tablet 3  . losartan (COZAAR) 25 MG tablet Take 2 tablets (50 mg total) by mouth daily. 180 tablet 3  . Multiple Vitamins-Minerals (ZINC PO) Take by mouth. Reported on 01/19/2016    . omeprazole (PRILOSEC) 40 MG capsule Take 1 tablet by mouth 30 min before breakfast. 90 capsule 3  . polyethylene glycol powder (GLYCOLAX/MIRALAX) powder TAKE 17 GRAMS BY MOUTH DAILY AS NEEDED FOR MILD CONSTIPATION. 527 g 11  . potassium chloride SA (K-DUR,KLOR-CON) 20 MEQ tablet TAKE 1 TABLET BY MOUTH DAILY. 30 tablet 3  . thiamine (VITAMIN B-1) 100 MG tablet Take 1 tablet (100 mg total) by mouth daily. 90 tablet 3  . ULTICARE SHORT PEN NEEDLES 31G X 8 MM MISC USE WITH INSULIN PEN DAILY. 100 each 3  . vitamin B-6 (PYRIDOXINE) 25 MG tablet Take 1 tablet (25 mg total) by mouth daily. 90 tablet 1  . vitamin C (ASCORBIC ACID) 500 MG tablet Take 1,000 mg by mouth 2 (two) times daily. Reported on 01/19/2016    . vitamin E 200 UNIT capsule Take 400 Units by mouth every morning. Reported on 01/19/2016    . zinc gluconate 50 MG tablet Take 1 tablet (50 mg total) by mouth daily. Reported on 01/19/2016 90 tablet 1   No current facility-administered medications on file prior to visit.     Review of Systems  Constitutional: Negative for other unusual diaphoresis or sweats HENT: Negative for ear discharge or swelling Eyes: Negative for other worsening visual disturbances Respiratory: Negative for stridor or other swelling  Gastrointestinal: Negative for worsening distension or other blood Genitourinary: Negative for retention or other urinary change Musculoskeletal: Negative for other MSK  pain or swelling Skin: Negative for color change or other new lesions Neurological: Negative for worsening tremors and other numbness  Psychiatric/Behavioral: Negative for worsening agitation or other fatigue All other system neg per pt    Objective:   Physical Exam BP 126/78   Pulse 81   Temp 98 F (36.7 C) (Oral)   Ht 6' (1.829 m)   Wt (!) 490 lb (222.3 kg)   SpO2 96%   BMI 66.46 kg/m  VS noted, supermorbid obese, very difficult but able to get himself up on exam table. Constitutional: Pt appears in NAD HENT: Head: NCAT.  Right Ear: External ear  normal.  Left Ear: External ear normal.  Eyes: . Pupils are equal, round, and reactive to light. Conjunctivae and EOM are normal Bilat tm's with mild erythema.  Max sinus areas non tender.  Pharynx with mild erythema, no exudate Nose: without d/c or deformity Neck: Neck supple. Gross normal ROM Cardiovascular: Normal rate and regular rhythm.   Pulmonary/Chest: Effort normal and breath sounds decresaed moderately without rales but with mild scattered bilat wheezing.  Neurological: Pt is alert. At baseline orientation, motor grossly intact Skin: Skin is warm. No rashes, other new lesions, no LE edema Psychiatric: Pt behavior is normal without agitation , mild nervous No other exam findings    Assessment & Plan:

## 2017-06-30 NOTE — Assessment & Plan Note (Signed)
Stable no on current insulin, controlled fairly well,  Lab Results  Component Value Date   HGBA1C 8.7 (H) 05/01/2017  pt to call for worsening polys or cbg > 200 with tx and illness

## 2017-06-30 NOTE — Assessment & Plan Note (Signed)
Mild to mod, c/w bronchitis vs pna, declines cxr as unable to be done at this facility due to size, for antibx course, cough med prn,  to f/u any worsening symptoms or concerns

## 2017-06-30 NOTE — Patient Instructions (Signed)
You had the steroid shot today  Please take all new medication as prescribed - the antibiotic, cough pills, prednisone, inhaler as needed, and the ambien for sleep as needed  Please continue all other medications as before, and refills have been done if requested - the hydrocodone  Please have the pharmacy call with any other refills you may need.  Please keep your appointments with your specialists as you may have planned

## 2017-06-30 NOTE — Assessment & Plan Note (Signed)
With chronic pain  - for pain med refills,  to f/u any worsening symptoms or concerns

## 2017-06-30 NOTE — Assessment & Plan Note (Signed)
Mild to mod, c/w I suspect asthmatic bronchitis - for prednisone asd,  to f/u any worsening symptoms or concerns

## 2017-06-30 NOTE — Assessment & Plan Note (Signed)
Also for limited rx ambien, cont cpap for OSA,  to f/u any worsening symptoms or concerns

## 2017-07-03 ENCOUNTER — Telehealth: Payer: Self-pay | Admitting: Internal Medicine

## 2017-07-03 MED ORDER — LEVOFLOXACIN 500 MG PO TABS: 500.0000 mg | ORAL_TABLET | Freq: Every day | ORAL | 0 refills | Status: AC

## 2017-07-03 MED ORDER — PREDNISONE 10 MG PO TABS: ORAL_TABLET | ORAL | 0 refills | Status: DC

## 2017-07-03 MED ORDER — ALBUTEROL SULFATE HFA 108 (90 BASE) MCG/ACT IN AERS: 2.0000 | INHALATION_SPRAY | Freq: Four times a day (QID) | RESPIRATORY_TRACT | 1 refills | Status: DC | PRN

## 2017-07-03 MED ORDER — BENZONATATE 100 MG PO CAPS: ORAL_CAPSULE | ORAL | 0 refills | Status: DC

## 2017-07-03 MED ORDER — LEVOFLOXACIN 500 MG PO TABS: 500.0000 mg | ORAL_TABLET | Freq: Every day | ORAL | 0 refills | Status: DC

## 2017-07-03 NOTE — Telephone Encounter (Signed)
Resent rx's from 12/12 to [facility].Marland KitchenJohny Chess

## 2017-07-03 NOTE — Telephone Encounter (Signed)
Pt called and the pharmacy did not have power, all the meds sent on 10/12, please resend  Also patient had blood in his stool on Saturday would like something called in for this as well.  Please advise

## 2017-07-05 ENCOUNTER — Encounter: Payer: Self-pay | Admitting: Internal Medicine

## 2017-07-05 ENCOUNTER — Other Ambulatory Visit (INDEPENDENT_AMBULATORY_CARE_PROVIDER_SITE_OTHER): Payer: Medicare HMO

## 2017-07-05 ENCOUNTER — Ambulatory Visit (INDEPENDENT_AMBULATORY_CARE_PROVIDER_SITE_OTHER): Payer: Medicare HMO | Admitting: Internal Medicine

## 2017-07-05 VITALS — BP 128/78 | HR 73 | Ht 72.0 in | Wt >= 6400 oz

## 2017-07-05 DIAGNOSIS — Z6841 Body Mass Index (BMI) 40.0 and over, adult: Secondary | ICD-10-CM | POA: Diagnosis not present

## 2017-07-05 DIAGNOSIS — Z85038 Personal history of other malignant neoplasm of large intestine: Secondary | ICD-10-CM | POA: Diagnosis not present

## 2017-07-05 DIAGNOSIS — K625 Hemorrhage of anus and rectum: Secondary | ICD-10-CM

## 2017-07-05 DIAGNOSIS — D649 Anemia, unspecified: Secondary | ICD-10-CM | POA: Diagnosis not present

## 2017-07-05 LAB — CBC WITH DIFFERENTIAL/PLATELET
BASOS PCT: 0.6 % (ref 0.0–3.0)
Basophils Absolute: 0.1 10*3/uL (ref 0.0–0.1)
EOS ABS: 0 10*3/uL (ref 0.0–0.7)
Eosinophils Relative: 0.2 % (ref 0.0–5.0)
HCT: 33.3 % — ABNORMAL LOW (ref 39.0–52.0)
HEMOGLOBIN: 10.6 g/dL — AB (ref 13.0–17.0)
LYMPHS ABS: 1.2 10*3/uL (ref 0.7–4.0)
Lymphocytes Relative: 11.6 % — ABNORMAL LOW (ref 12.0–46.0)
MCHC: 31.7 g/dL (ref 30.0–36.0)
MCV: 89.2 fl (ref 78.0–100.0)
MONO ABS: 0.2 10*3/uL (ref 0.1–1.0)
Monocytes Relative: 2.2 % — ABNORMAL LOW (ref 3.0–12.0)
Neutro Abs: 9.1 10*3/uL — ABNORMAL HIGH (ref 1.4–7.7)
PLATELETS: 276 10*3/uL (ref 150.0–400.0)
RBC: 3.74 Mil/uL — ABNORMAL LOW (ref 4.22–5.81)
RDW: 15.4 % (ref 11.5–15.5)
WBC: 10.6 10*3/uL — AB (ref 4.0–10.5)

## 2017-07-05 LAB — FERRITIN: FERRITIN: 297 ng/mL (ref 22.0–322.0)

## 2017-07-05 NOTE — Patient Instructions (Signed)
  Your physician has requested that you go to the basement for the following lab work before leaving today: CBC/diff, Ferritin   You have been scheduled for a colonoscopy. Please follow written instructions given to you at your visit today.  Please pick up your prep supplies at the pharmacy. If you use inhalers (even only as needed), please bring them with you on the day of your procedure.   I appreciate the opportunity to care for you. Silvano Rusk, MD, [facility]

## 2017-07-05 NOTE — Progress Notes (Signed)
[name] 61 y.o. August 20, 1956 [id number]  Assessment & Plan:   Encounter Diagnoses  Name Primary?  . Rectal bleeding Yes  . Mild chronic anemia   . Personal history of colon cancer   . Morbid obesity with BMI of 60.0-69.9, adult [facility])     Colonoscopy The risks and benefits as well as alternatives of endoscopic procedure(s) have been discussed and reviewed. All questions answered. The patient agrees to proceed. CBC, ferritin Reviewed bariatric options - he will not go to [facility] as had failed hernia surgery He will consider bariatric surgery options     Subjective:   Chief Complaint: Constipation and rectal bleeding lower abdominal pain  HPI [name] is here, last seen by our staff in April of this year for some upper abdominal pain, he has a history of sigmoidal colon cancer status post resection in 2007.  He has been reporting and seeing some bright red blood per rectum over the past several months.  Some mild lower abdominal cramping.  A little bit of rectal pain at times.  Says he had a lot of bleeding several days ago MiraLAX is helping his chronic constipation. Had a lap band procedure done at [facility] within the last couple of years and lost greater than 100 pounds but then he had some problems with hernias and other illness and says he gained it all back.  He says he knows "a lot of people that died from gastric bypass and that scares him from doing the procedure" .  Those people all lived in [place]. He can palpate the lap band port in his abdomen and is concerned about this though it is not tender. Allergies  Allergen Reactions  . Shellfish Allergy Anaphylaxis  . Lisinopril Cough   Current Meds  Medication Sig  . albuterol (PROVENTIL HFA;VENTOLIN HFA) 108 (90 Base) MCG/ACT inhaler Inhale 2 puffs into the lungs every 6 (six) hours as needed for wheezing or shortness of breath.  Marland Kitchen aspirin EC 325 MG tablet Take 81 mg by mouth every evening.   Marland Kitchen atorvastatin (LIPITOR) 40 MG  tablet TAKE 1 TABLET BY MOUTH DAILY.  . benzonatate (TESSALON PERLES) 100 MG capsule 1-2 tab by mouth every 6 hrs as needed for cough  . bisacodyl (DULCOLAX) 5 MG EC tablet Take 5 mg by mouth daily as needed for moderate constipation.  . bisoprolol (ZEBETA) 10 MG tablet TAKE 1 TABLET (10 MG TOTAL) BY MOUTH EVERY EVENING.  Marland Kitchen Cholecalciferol 2000 units TABS Take 1 tablet (2,000 Units total) by mouth daily.  . cycloSPORINE (RESTASIS) 0.05 % ophthalmic emulsion Place 1 drop into both eyes 2 (two) times daily.  . Diclofenac Sodium (PENNSAID) 1.5 % SOLN Place 0.2 mLs onto the skin 3 (three) times daily.  . Empagliflozin-Metformin HCl (SYNJARDY) 01-999 MG TABS Take 1 tablet by mouth 2 (two) times daily.  . ferrous sulfate 325 (65 FE) MG tablet TAKE 1 TABLET BY MOUTH 2 TIMES DAILY WITH A MEAL.  . furosemide (LASIX) 80 MG tablet Take 1 tablet (80 mg total) by mouth 2 (two) times daily.  Marland Kitchen glucose blood (ONETOUCH VERIO) test strip USE  STRIP TO CHECK GLUCOSE THREE TIMES DAILY  . HYDROcodone-acetaminophen (NORCO) 10-325 MG tablet Take 1 tablet by mouth every 8 (eight) hours as needed.  . Insulin Detemir (LEVEMIR FLEXTOUCH) 100 UNIT/ML Pen Inject 240 Units into the skin every morning.  Marland Kitchen levofloxacin (LEVAQUIN) 500 MG tablet Take 1 tablet (500 mg total) by mouth daily.  Marland Kitchen levothyroxine (SYNTHROID, LEVOTHROID)  50 MCG tablet TAKE 1 TABLET BY MOUTH DAILY BEFORE BREAKFAST.  Marland Kitchen losartan (COZAAR) 25 MG tablet Take 2 tablets (50 mg total) by mouth daily.  . Multiple Vitamins-Minerals (ZINC PO) Take by mouth. Reported on 01/19/2016  . omeprazole (PRILOSEC) 40 MG capsule Take 1 tablet by mouth 30 min before breakfast.  . polyethylene glycol powder (GLYCOLAX/MIRALAX) powder TAKE 17 GRAMS BY MOUTH DAILY AS NEEDED FOR MILD CONSTIPATION.  Marland Kitchen potassium chloride SA (K-DUR,KLOR-CON) 20 MEQ tablet TAKE 1 TABLET BY MOUTH DAILY.  Marland Kitchen predniSONE (DELTASONE) 10 MG tablet 3 tabs by mouth per day for 3 days,2tabs per day for 3 days,1tab  per day for 3 days  . thiamine (VITAMIN B-1) 100 MG tablet Take 1 tablet (100 mg total) by mouth daily.  Marland Kitchen ULTICARE SHORT PEN NEEDLES 31G X 8 MM MISC USE WITH INSULIN PEN DAILY.  . vitamin B-6 (PYRIDOXINE) 25 MG tablet Take 1 tablet (25 mg total) by mouth daily.  . vitamin C (ASCORBIC ACID) 500 MG tablet Take 1,000 mg by mouth 2 (two) times daily. Reported on 01/19/2016  . vitamin E 200 UNIT capsule Take 400 Units by mouth every morning. Reported on 01/19/2016  . zinc gluconate 50 MG tablet Take 1 tablet (50 mg total) by mouth daily. Reported on 01/19/2016  . zolpidem (AMBIEN) 10 MG tablet Take 1 tablet (10 mg total) by mouth at bedtime as needed for sleep.  . [DISCONTINUED] albuterol (PROVENTIL HFA;VENTOLIN HFA) 108 (90 Base) MCG/ACT inhaler Inhale 2 puffs into the lungs every 6 (six) hours as needed for wheezing or shortness of breath.  . [DISCONTINUED] benzonatate (TESSALON PERLES) 100 MG capsule 1-2 tab by mouth every 6 hrs as needed for cough  . [DISCONTINUED] furosemide (LASIX) 40 MG tablet Take 80 mg by mouth 2 (two) times daily.  . [DISCONTINUED] levofloxacin (LEVAQUIN) 500 MG tablet Take 1 tablet (500 mg total) by mouth daily.  . [DISCONTINUED] predniSONE (DELTASONE) 10 MG tablet 3 tabs by mouth per day for 3 days,2tabs per day for 3 days,1tab per day for 3 days   Past Medical History:  Diagnosis Date  . Anemia   . CHF (congestive heart failure) (HCC)    Presumed diastolic. Echo (06/07) w.EF 45%, severe posterior HK, mild LV hypertrophy, No further work-up of abnormal echo was done.  . Colon cancer [facility]) 2007   s/p sigmoid colectomy  . Diabetes mellitus    Has Hx of diabetic foot ulcer & peripheral neuropathy  . History of PFTs 05/2009   Mild Obstructive defect  . HTN (hypertension)   . Hyperlipidemia   . Morbid obesity ([place])   . OSA on CPAP    Past Surgical History:  Procedure Laterality Date  . BARIATRIC SURGERY  12/2009   Lap Band/At [facility]  . COLONOSCOPY  multiple  .  COLONOSCOPY WITH PROPOFOL N/A 11/26/2012   Procedure: COLONOSCOPY WITH PROPOFOL;  Surgeon: Gatha Mayer, MD;  Location: [facility];  Service: Endoscopy;  Laterality: N/A;  may need pre appt. with anesthesia due to morbid obesity  . [place]   '80/Right  '84/Left  . EYE SURGERY Right 05/2017  . HERNIA REPAIR    . HIP SURGERY     bilateral  . INCISIONAL HERNIA REPAIR N/A 12/18/2013   Procedure:  REPAIR OF INCARCERATED INCISIONAL HERNIA;  Surgeon: Rolm Bookbinder, MD;  Location: [place];  Service: General;  Laterality: N/A;  . INSERTION OF MESH N/A 12/18/2013   Procedure: INSERTION OF MESH;  Surgeon: Rolm Bookbinder, MD;  Location: [place];  Service: General;  Laterality: N/A;  . Sigmoid Colectomy  10/2005   [place]  . TONSILLECTOMY     Social History   Social History  . Marital status: Legally Separated    Spouse name: N/A  . Number of children: 0   Occupational History  . Food Industry    Social History Main Topics  . Smoking status: Never Smoker  . Smokeless tobacco: Never Used  . Alcohol use Not on file     Comment: states he does not drink   . Drug use: No  . 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   In new relationship (04/2009) - girlfriend helps taste for cooking business and helps monitor for ulcers.   Regular Exercise- No         family history includes CAD in his mother; Congestive Heart Failure in his mother; Diabetes in his father and mother; Heart attack (age of onset: 56) in his brother; Heart failure in his brother; Hepatitis in his mother.   Review of Systems Uses a walker to ambulate.  Denies any chest pains but is having some dyspnea.  Objective:   Physical Exam BP 128/78   Pulse 73    Ht 6' (1.829 m)   Wt (!) 490 lb (222.3 kg)   BMI 66.46 kg/m  Morbidly pobese pleasant NAD Eyes are anicteric Lungs ctta ant Ht sounds NL abdobese w/ scars and ventral hernia and palpable lap band port lower upper abd to right of midline  Data reviewed, he has a chronic anemia with hemoglobin in the 10 and a normal MCV.

## 2017-07-07 ENCOUNTER — Encounter (HOSPITAL_COMMUNITY): Payer: Self-pay

## 2017-07-07 ENCOUNTER — Encounter: Payer: Self-pay | Admitting: Internal Medicine

## 2017-07-07 NOTE — Progress Notes (Signed)
Per Dr. Kalman Shan no need to repeat EKG or CXR

## 2017-07-11 ENCOUNTER — Ambulatory Visit (HOSPITAL_COMMUNITY): Payer: Medicare HMO | Admitting: Certified Registered Nurse Anesthetist

## 2017-07-11 ENCOUNTER — Ambulatory Visit (HOSPITAL_COMMUNITY)
Admission: RE | Admit: 2017-07-11 | Discharge: 2017-07-11 | Disposition: A | Discharge status: Home / Self Care | Payer: Medicare HMO | Source: Ambulatory Visit | Attending: Internal Medicine | Admitting: Internal Medicine

## 2017-07-11 ENCOUNTER — Encounter (HOSPITAL_COMMUNITY): Payer: Self-pay

## 2017-07-11 ENCOUNTER — Encounter (HOSPITAL_COMMUNITY)
Admission: RE | Disposition: A | Discharge status: Home / Self Care | Payer: Self-pay | Source: Ambulatory Visit | Attending: Internal Medicine

## 2017-07-11 DIAGNOSIS — K625 Hemorrhage of anus and rectum: Secondary | ICD-10-CM | POA: Diagnosis not present

## 2017-07-11 DIAGNOSIS — Z980 Intestinal bypass and anastomosis status: Secondary | ICD-10-CM | POA: Diagnosis not present

## 2017-07-11 DIAGNOSIS — Z85048 Personal history of other malignant neoplasm of rectum, rectosigmoid junction, and anus: Secondary | ICD-10-CM | POA: Insufficient documentation

## 2017-07-11 DIAGNOSIS — E1142 Type 2 diabetes mellitus with diabetic polyneuropathy: Secondary | ICD-10-CM | POA: Diagnosis not present

## 2017-07-11 DIAGNOSIS — Z888 Allergy status to other drugs, medicaments and biological substances status: Secondary | ICD-10-CM | POA: Diagnosis not present

## 2017-07-11 DIAGNOSIS — Z9884 Bariatric surgery status: Secondary | ICD-10-CM | POA: Insufficient documentation

## 2017-07-11 DIAGNOSIS — Z8249 Family history of ischemic heart disease and other diseases of the circulatory system: Secondary | ICD-10-CM | POA: Insufficient documentation

## 2017-07-11 DIAGNOSIS — G709 Myoneural disorder, unspecified: Secondary | ICD-10-CM | POA: Diagnosis not present

## 2017-07-11 DIAGNOSIS — R1030 Lower abdominal pain, unspecified: Secondary | ICD-10-CM | POA: Diagnosis not present

## 2017-07-11 DIAGNOSIS — Z794 Long term (current) use of insulin: Secondary | ICD-10-CM | POA: Diagnosis not present

## 2017-07-11 DIAGNOSIS — K439 Ventral hernia without obstruction or gangrene: Secondary | ICD-10-CM | POA: Diagnosis not present

## 2017-07-11 DIAGNOSIS — Z7982 Long term (current) use of aspirin: Secondary | ICD-10-CM | POA: Insufficient documentation

## 2017-07-11 DIAGNOSIS — I110 Hypertensive heart disease with heart failure: Secondary | ICD-10-CM | POA: Insufficient documentation

## 2017-07-11 DIAGNOSIS — D649 Anemia, unspecified: Secondary | ICD-10-CM | POA: Insufficient documentation

## 2017-07-11 DIAGNOSIS — Q438 Other specified congenital malformations of intestine: Secondary | ICD-10-CM | POA: Diagnosis not present

## 2017-07-11 DIAGNOSIS — Z79899 Other long term (current) drug therapy: Secondary | ICD-10-CM | POA: Diagnosis not present

## 2017-07-11 DIAGNOSIS — Z6841 Body Mass Index (BMI) 40.0 and over, adult: Secondary | ICD-10-CM | POA: Insufficient documentation

## 2017-07-11 DIAGNOSIS — Z9989 Dependence on other enabling machines and devices: Secondary | ICD-10-CM | POA: Insufficient documentation

## 2017-07-11 DIAGNOSIS — Z91013 Allergy to seafood: Secondary | ICD-10-CM | POA: Insufficient documentation

## 2017-07-11 DIAGNOSIS — K5909 Other constipation: Secondary | ICD-10-CM | POA: Insufficient documentation

## 2017-07-11 DIAGNOSIS — D509 Iron deficiency anemia, unspecified: Secondary | ICD-10-CM | POA: Diagnosis not present

## 2017-07-11 DIAGNOSIS — K648 Other hemorrhoids: Secondary | ICD-10-CM

## 2017-07-11 DIAGNOSIS — I447 Left bundle-branch block, unspecified: Secondary | ICD-10-CM | POA: Diagnosis not present

## 2017-07-11 DIAGNOSIS — E785 Hyperlipidemia, unspecified: Secondary | ICD-10-CM | POA: Diagnosis not present

## 2017-07-11 DIAGNOSIS — E039 Hypothyroidism, unspecified: Secondary | ICD-10-CM | POA: Diagnosis not present

## 2017-07-11 DIAGNOSIS — Z85038 Personal history of other malignant neoplasm of large intestine: Secondary | ICD-10-CM | POA: Diagnosis not present

## 2017-07-11 DIAGNOSIS — Z9049 Acquired absence of other specified parts of digestive tract: Secondary | ICD-10-CM | POA: Insufficient documentation

## 2017-07-11 DIAGNOSIS — M1990 Unspecified osteoarthritis, unspecified site: Secondary | ICD-10-CM | POA: Insufficient documentation

## 2017-07-11 DIAGNOSIS — K649 Unspecified hemorrhoids: Secondary | ICD-10-CM | POA: Diagnosis not present

## 2017-07-11 DIAGNOSIS — I509 Heart failure, unspecified: Secondary | ICD-10-CM | POA: Insufficient documentation

## 2017-07-11 DIAGNOSIS — G4733 Obstructive sleep apnea (adult) (pediatric): Secondary | ICD-10-CM | POA: Insufficient documentation

## 2017-07-11 HISTORY — DX: Dyspnea, unspecified: R06.00

## 2017-07-11 HISTORY — DX: Pneumonia, unspecified organism: J18.9

## 2017-07-11 HISTORY — PX: COLONOSCOPY WITH PROPOFOL: SHX5780

## 2017-07-11 LAB — HM COLONOSCOPY

## 2017-07-11 LAB — GLUCOSE, CAPILLARY: GLUCOSE-CAPILLARY: 88 mg/dL (ref 65–99)

## 2017-07-11 SURGERY — COLONOSCOPY WITH PROPOFOL
Anesthesia: Monitor Anesthesia Care

## 2017-07-11 MED ORDER — PROPOFOL 10 MG/ML IV BOLUS
INTRAVENOUS | Status: AC
Start: 2017-07-11 — End: 2017-07-11
  Filled 2017-07-11: qty 40

## 2017-07-11 MED ORDER — KETAMINE HCL 10 MG/ML IJ SOLN
INTRAMUSCULAR | Status: DC | PRN
  Administered 2017-07-11 (×4): 10 mg via INTRAVENOUS

## 2017-07-11 MED ORDER — ONDANSETRON HCL 4 MG/2ML IJ SOLN
INTRAMUSCULAR | Status: AC
  Filled 2017-07-11: qty 2

## 2017-07-11 MED ORDER — KETAMINE HCL 10 MG/ML IJ SOLN
INTRAMUSCULAR | Status: AC
  Filled 2017-07-11: qty 1

## 2017-07-11 MED ORDER — LIDOCAINE 2% (20 MG/ML) 5 ML SYRINGE
INTRAMUSCULAR | Status: AC
  Filled 2017-07-11: qty 5

## 2017-07-11 MED ORDER — PROPOFOL 10 MG/ML IV BOLUS
INTRAVENOUS | Status: DC | PRN
  Administered 2017-07-11: 10 mg via INTRAVENOUS

## 2017-07-11 MED ORDER — PROPOFOL 10 MG/ML IV BOLUS
INTRAVENOUS | Status: AC
  Filled 2017-07-11: qty 20

## 2017-07-11 MED ORDER — LACTATED RINGERS IV SOLN
INTRAVENOUS | Status: DC | PRN
  Administered 2017-07-11: 12:00:00 via INTRAVENOUS

## 2017-07-11 MED ORDER — PROPOFOL 500 MG/50ML IV EMUL
INTRAVENOUS | Status: DC | PRN
  Administered 2017-07-11: 75 ug/kg/min via INTRAVENOUS

## 2017-07-11 MED ORDER — SODIUM CHLORIDE 0.9 % IV SOLN: INTRAVENOUS | Status: DC

## 2017-07-11 SURGICAL SUPPLY — 21 items

## 2017-07-11 NOTE — Discharge Instructions (Addendum)
° °  The bleeding has been coming from hemorrhoids. They are not dangerous.  Everything else ok.  Stay on the MiraLax.  I will prescribe some hydrocortisone cream for you to apply inside the rectum if you want - if you have more bleeding problems let me know and we will send that rx in.  Otherwise will check colonoscopy again in 5 years.  I do recommend you explore bariatric surgery again - can get fluid put back into the band or possible have the bypass. You can call [facility] at [phone] and attend a free seminar. Call me back if you have any other questions about this.   I appreciate the opportunity to care for you. Gatha Mayer, MD, FACG  YOU HAD AN ENDOSCOPIC PROCEDURE TODAY: Refer to the procedure report and other information in the discharge instructions given to you for any specific questions about what was found during the examination. If this information does not answer your questions, please call Dr. Celesta Aver office at [phone] to clarify.   YOU SHOULD EXPECT: Some feelings of bloating in the abdomen. Passage of more gas than usual. Walking can help get rid of the air that was put into your GI tract during the procedure and reduce the bloating. If you had a lower endoscopy (such as a colonoscopy or flexible sigmoidoscopy) you may notice spotting of blood in your stool or on the toilet paper. Some abdominal soreness may be present for a day or two, also.  DIET: Your first meal following the procedure should be a light meal and then it is ok to progress to your normal diet. A half-sandwich or bowl of soup is an example of a good first meal. Heavy or fried foods are harder to digest and may make you feel nauseous or bloated. Drink plenty of fluids but you should avoid alcoholic beverages for 24 hours.   ACTIVITY: Your care partner should take you home directly after the procedure. You should plan to take it easy, moving slowly for the rest of the day. You can resume normal  activity the day after the procedure however YOU SHOULD NOT DRIVE, use power tools, machinery or perform tasks that involve climbing or major physical exertion for 24 hours (because of the sedation medicines used during the test).   SYMPTOMS TO REPORT IMMEDIATELY: A gastroenterologist can be reached at any hour. Please call [phone]  for any of the following symptoms:  Following lower endoscopy (colonoscopy, flexible sigmoidoscopy) Excessive amounts of blood in the stool  Significant tenderness, worsening of abdominal pains  Swelling of the abdomen that is new, acute  Fever of 100 or higher  Following upper endoscopy (EGD, EUS, ERCP, esophageal dilation) Vomiting of blood or coffee ground material  New, significant abdominal pain  New, significant chest pain or pain under the shoulder blades  Painful or persistently difficult swallowing  New shortness of breath  Black, tarry-looking or red, bloody stools  FOLLOW UP:  If any biopsies were taken you will be contacted by phone or by letter within the next 1-3 weeks. Call [phone]  if you have not heard about the biopsies in 3 weeks.  Please also call with any specific questions about appointments or follow up tests.

## 2017-07-11 NOTE — Op Note (Signed)
[facility] Patient Name: [name] Procedure Date: 07/11/2017 MRN: [id number] Attending MD: Gatha Mayer , MD Date of Birth: Aug 23, 1956 CSN: [id number] Age: 61 Admit Type: Outpatient Procedure:                Colonoscopy Indications:              Rectal bleeding, Personal history of malignant                            rectal neoplasm Providers:                Gatha Mayer, MD, Cleda Daub, RN, Alan Mulder, Technician Referring MD:              Medicines:                Propofol per Anesthesia, Monitored Anesthesia Care Complications:            No immediate complications. Estimated Blood Loss:     Estimated blood loss: none. Procedure:                Pre-Anesthesia Assessment:                           - Prior to the procedure, a History and Physical                            was performed, and patient medications and                            allergies were reviewed. The patient's tolerance of                            previous anesthesia was also reviewed. The risks                            and benefits of the procedure and the sedation                            options and risks were discussed with the patient.                            All questions were answered, and informed consent                            was obtained. Prior Anticoagulants: The patient has                            taken no previous anticoagulant or antiplatelet                            agents. ASA Grade Assessment: III - A patient with  severe systemic disease. After reviewing the risks                            and benefits, the patient was deemed in                            satisfactory condition to undergo the procedure.                           After obtaining informed consent, the colonoscope                            was passed under direct vision. Throughout the                            procedure,  the patient's blood pressure, pulse, and                            oxygen saturations were monitored continuously. The                            Colonoscope was introduced through the anus and                            advanced to the the cecum, identified by                            appendiceal orifice and ileocecal valve. The                            colonoscopy was somewhat difficult due to                            significant looping. Successful completion of the                            procedure was aided by applying abdominal pressure.                            The patient tolerated the procedure well. The                            quality of the bowel preparation was adequate. The                            ileocecal valve, appendiceal orifice, and rectum                            were photographed. The bowel preparation used was                            Miralax. Scope In: 12:47:42 PM Scope Out: 1:05:20 PM Scope Withdrawal Time: 0 hours 11 minutes 45 seconds  Total Procedure Duration: 0 hours 17 minutes 38 seconds  Findings:      The perianal and digital rectal examinations were normal. Pertinent       negatives include normal prostate (size, shape, and consistency).      Internal hemorrhoids were found during retroflexion.      There was evidence of a prior end-to-side colo-colonic anastomosis in       the sigmoid colon. This was patent and was characterized by healthy       appearing mucosa.      The exam was otherwise without abnormality on direct and retroflexion       views. Impression:               - Internal hemorrhoids.                           - Patent end-to-side colo-colonic anastomosis,                            characterized by healthy appearing mucosa.                           - The examination was otherwise normal on direct                            and retroflexion views.                           - No specimens collected. Moderate  Sedation:      N/A- Per Anesthesia Care Recommendation:           - Patient has a contact number available for                            emergencies. The signs and symptoms of potential                            delayed complications were discussed with the                            patient. Return to normal activities tomorrow.                            Written discharge instructions were provided to the                            patient.                           - Resume previous diet.                           - Continue present medications.                           - Repeat colonoscopy in 5 years for surveillance. Procedure Code(s):        --- Professional ---  45378, Colonoscopy, flexible; diagnostic, including                            collection of specimen(s) by brushing or washing,                            when performed (separate procedure) Diagnosis Code(s):        --- Professional ---                           Z98.0, Intestinal bypass and anastomosis status                           K64.8, Other hemorrhoids                           K62.5, Hemorrhage of anus and rectum                           Z85.048, Personal history of other malignant                            neoplasm of rectum, rectosigmoid junction, and anus CPT copyright 2016 American Medical Association. All rights reserved. The codes documented in this report are preliminary and upon coder review may  be revised to meet current compliance requirements. Gatha Mayer, MD 07/11/2017 1:14:52 PM This report has been signed electronically. Number of Addenda: 0

## 2017-07-11 NOTE — Anesthesia Postprocedure Evaluation (Signed)
Anesthesia Post Note  Patient: [name]  Procedure(s) Performed: COLONOSCOPY WITH PROPOFOL (N/A )     Patient location during evaluation: Endoscopy Anesthesia Type: MAC Level of consciousness: awake and alert and oriented Pain management: pain level controlled Vital Signs Assessment: post-procedure vital signs reviewed and stable Respiratory status: spontaneous breathing, nonlabored ventilation and respiratory function stable Cardiovascular status: stable and blood pressure returned to baseline Postop Assessment: no apparent nausea or vomiting Anesthetic complications: no    Last Vitals:  Vitals:   07/11/17 1315  BP: (!) 154/80  Pulse: 67  Resp: 16  Temp: 36.5 C  SpO2: 97%    Last Pain: There were no vitals filed for this visit.               Damarys Speir A.

## 2017-07-11 NOTE — Anesthesia Procedure Notes (Signed)
Procedure Name: MAC Date/Time: 07/11/2017 12:40 PM Performed by: West Pugh Pre-anesthesia Checklist: Patient identified, Emergency Drugs available, Suction available, Patient being monitored and Timeout performed Placement Confirmation: CO2 detector Dental Injury: Teeth and Oropharynx as per pre-operative assessment

## 2017-07-11 NOTE — Interval H&P Note (Signed)
History and Physical Interval Note:  07/11/2017 12:26 PM  [name]  has presented today for surgery, with the diagnosis of rectal bleeding, hx of colon cancer  The various methods of treatment have been discussed with the patient and family. After consideration of risks, benefits and other options for treatment, the patient has consented to  Procedure(s): COLONOSCOPY WITH PROPOFOL (N/A) as a surgical intervention .  The patient's history has been reviewed, patient examined, no change in status, stable for surgery.  I have reviewed the patient's chart and labs.  Questions were answered to the patient's satisfaction.     Silvano Rusk

## 2017-07-11 NOTE — Anesthesia Preprocedure Evaluation (Addendum)
Anesthesia Evaluation  Patient identified by MRN, date of birth, ID band Patient awake    Reviewed: Allergy & Precautions, NPO status , Patient's Chart, lab work & pertinent test results, reviewed documented beta blocker date and time   Airway Mallampati: III  TM Distance: >3 FB Neck ROM: Full    Dental no notable dental hx. (+) Edentulous Upper, Edentulous Lower   Pulmonary shortness of breath and with exertion, sleep apnea and Continuous Positive Airway Pressure Ventilation , pneumonia, resolved,    Pulmonary exam normal breath sounds clear to auscultation       Cardiovascular hypertension, Pt. on medications and Pt. on home beta blockers +CHF  Normal cardiovascular exam Rhythm:Regular Rate:Normal  EKG: incomplete LBBB pattern Echo 2014: LVEF 50-55%   Neuro/Psych Diabetic neuropathy  Neuromuscular disease negative psych ROS   GI/Hepatic Hx/o Colon Ca S/P bariatric surgery   Endo/Other  diabetes, Well Controlled, Type 2, Oral Hypoglycemic AgentsHypothyroidism Morbid obesityHyperlipidemia  Renal/GU Renal InsufficiencyRenal disease   BPH    Musculoskeletal  (+) Arthritis , Diabetic foot ulcer   Abdominal (+) + obese,   Peds  Hematology  (+) anemia ,   Anesthesia Other Findings   Reproductive/Obstetrics                            Anesthesia Physical Anesthesia Plan  ASA: III  Anesthesia Plan: MAC   Post-op Pain Management:    Induction: Intravenous  PONV Risk Score and Plan: 2 and Ondansetron and Propofol infusion  Airway Management Planned: Natural Airway, Simple Face Mask and Nasal Cannula  Additional Equipment:   Intra-op Plan:   Post-operative Plan:   Informed Consent: I have reviewed the patients History and Physical, chart, labs and discussed the procedure including the risks, benefits and alternatives for the proposed anesthesia with the patient or authorized  representative who has indicated his/her understanding and acceptance.   Dental advisory given  Plan Discussed with: CRNA, Anesthesiologist and Surgeon  Anesthesia Plan Comments:        Anesthesia Quick Evaluation

## 2017-07-11 NOTE — H&P (View-Only) (Signed)
[name] 61 y.o. 1956/06/26 [id number]  Assessment & Plan:   Encounter Diagnoses  Name Primary?  . Rectal bleeding Yes  . Mild chronic anemia   . Personal history of colon cancer   . Morbid obesity with BMI of 60.0-69.9, adult [facility])     Colonoscopy The risks and benefits as well as alternatives of endoscopic procedure(s) have been discussed and reviewed. All questions answered. The patient agrees to proceed. CBC, ferritin Reviewed bariatric options - he will not go to [facility] as had failed hernia surgery He will consider bariatric surgery options     Subjective:   Chief Complaint: Constipation and rectal bleeding lower abdominal pain  HPI [name] is here, last seen by our staff in April of this year for some upper abdominal pain, he has a history of sigmoidal colon cancer status post resection in 2007.  He has been reporting and seeing some bright red blood per rectum over the past several months.  Some mild lower abdominal cramping.  A little bit of rectal pain at times.  Says he had a lot of bleeding several days ago MiraLAX is helping his chronic constipation. Had a lap band procedure done at [place] within the last couple of years and lost greater than 100 pounds but then he had some problems with hernias and other illness and says he gained it all back.  He says he knows "a lot of people that died from gastric bypass and that scares him from doing the procedure" .  Those people all lived in [place]. He can palpate the lap band port in his abdomen and is concerned about this though it is not tender. Allergies  Allergen Reactions  . Shellfish Allergy Anaphylaxis  . Lisinopril Cough   Current Meds  Medication Sig  . albuterol (PROVENTIL HFA;VENTOLIN HFA) 108 (90 Base) MCG/ACT inhaler Inhale 2 puffs into the lungs every 6 (six) hours as needed for wheezing or shortness of breath.  Marland Kitchen aspirin EC 325 MG tablet Take 81 mg by mouth every evening.   Marland Kitchen atorvastatin (LIPITOR) 40 MG  tablet TAKE 1 TABLET BY MOUTH DAILY.  . benzonatate (TESSALON PERLES) 100 MG capsule 1-2 tab by mouth every 6 hrs as needed for cough  . bisacodyl (DULCOLAX) 5 MG EC tablet Take 5 mg by mouth daily as needed for moderate constipation.  . bisoprolol (ZEBETA) 10 MG tablet TAKE 1 TABLET (10 MG TOTAL) BY MOUTH EVERY EVENING.  Marland Kitchen Cholecalciferol 2000 units TABS Take 1 tablet (2,000 Units total) by mouth daily.  . cycloSPORINE (RESTASIS) 0.05 % ophthalmic emulsion Place 1 drop into both eyes 2 (two) times daily.  . Diclofenac Sodium (PENNSAID) 1.5 % SOLN Place 0.2 mLs onto the skin 3 (three) times daily.  . Empagliflozin-Metformin HCl (SYNJARDY) 01-999 MG TABS Take 1 tablet by mouth 2 (two) times daily.  . ferrous sulfate 325 (65 FE) MG tablet TAKE 1 TABLET BY MOUTH 2 TIMES DAILY WITH A MEAL.  . furosemide (LASIX) 80 MG tablet Take 1 tablet (80 mg total) by mouth 2 (two) times daily.  Marland Kitchen glucose blood (ONETOUCH VERIO) test strip USE  STRIP TO CHECK GLUCOSE THREE TIMES DAILY  . HYDROcodone-acetaminophen (NORCO) 10-325 MG tablet Take 1 tablet by mouth every 8 (eight) hours as needed.  . Insulin Detemir (LEVEMIR FLEXTOUCH) 100 UNIT/ML Pen Inject 240 Units into the skin every morning.  Marland Kitchen levofloxacin (LEVAQUIN) 500 MG tablet Take 1 tablet (500 mg total) by mouth daily.  Marland Kitchen levothyroxine (SYNTHROID, LEVOTHROID)  50 MCG tablet TAKE 1 TABLET BY MOUTH DAILY BEFORE BREAKFAST.  Marland Kitchen losartan (COZAAR) 25 MG tablet Take 2 tablets (50 mg total) by mouth daily.  . Multiple Vitamins-Minerals (ZINC PO) Take by mouth. Reported on 01/19/2016  . omeprazole (PRILOSEC) 40 MG capsule Take 1 tablet by mouth 30 min before breakfast.  . polyethylene glycol powder (GLYCOLAX/MIRALAX) powder TAKE 17 GRAMS BY MOUTH DAILY AS NEEDED FOR MILD CONSTIPATION.  Marland Kitchen potassium chloride SA (K-DUR,KLOR-CON) 20 MEQ tablet TAKE 1 TABLET BY MOUTH DAILY.  Marland Kitchen predniSONE (DELTASONE) 10 MG tablet 3 tabs by mouth per day for 3 days,2tabs per day for 3 days,1tab  per day for 3 days  . thiamine (VITAMIN B-1) 100 MG tablet Take 1 tablet (100 mg total) by mouth daily.  Marland Kitchen ULTICARE SHORT PEN NEEDLES 31G X 8 MM MISC USE WITH INSULIN PEN DAILY.  . vitamin B-6 (PYRIDOXINE) 25 MG tablet Take 1 tablet (25 mg total) by mouth daily.  . vitamin C (ASCORBIC ACID) 500 MG tablet Take 1,000 mg by mouth 2 (two) times daily. Reported on 01/19/2016  . vitamin E 200 UNIT capsule Take 400 Units by mouth every morning. Reported on 01/19/2016  . zinc gluconate 50 MG tablet Take 1 tablet (50 mg total) by mouth daily. Reported on 01/19/2016  . zolpidem (AMBIEN) 10 MG tablet Take 1 tablet (10 mg total) by mouth at bedtime as needed for sleep.  . [DISCONTINUED] albuterol (PROVENTIL HFA;VENTOLIN HFA) 108 (90 Base) MCG/ACT inhaler Inhale 2 puffs into the lungs every 6 (six) hours as needed for wheezing or shortness of breath.  . [DISCONTINUED] benzonatate (TESSALON PERLES) 100 MG capsule 1-2 tab by mouth every 6 hrs as needed for cough  . [DISCONTINUED] furosemide (LASIX) 40 MG tablet Take 80 mg by mouth 2 (two) times daily.  . [DISCONTINUED] levofloxacin (LEVAQUIN) 500 MG tablet Take 1 tablet (500 mg total) by mouth daily.  . [DISCONTINUED] predniSONE (DELTASONE) 10 MG tablet 3 tabs by mouth per day for 3 days,2tabs per day for 3 days,1tab per day for 3 days   Past Medical History:  Diagnosis Date  . Anemia   . CHF (congestive heart failure) (HCC)    Presumed diastolic. Echo (06/07) w.EF 45%, severe posterior HK, mild LV hypertrophy, No further work-up of abnormal echo was done.  . Colon cancer [facility]) 2007   s/p sigmoid colectomy  . Diabetes mellitus    Has Hx of diabetic foot ulcer & peripheral neuropathy  . History of PFTs 05/2009   Mild Obstructive defect  . HTN (hypertension)   . Hyperlipidemia   . Morbid obesity ([place])   . OSA on CPAP    Past Surgical History:  Procedure Laterality Date  . BARIATRIC SURGERY  12/2009   Lap Band/At [facility]  . COLONOSCOPY  multiple  .  COLONOSCOPY WITH PROPOFOL N/A 11/26/2012   Procedure: COLONOSCOPY WITH PROPOFOL;  Surgeon: Gatha Mayer, MD;  Location: [facility];  Service: Endoscopy;  Laterality: N/A;  may need pre appt. with anesthesia due to morbid obesity  . [place]   '80/Right  '84/Left  . EYE SURGERY Right 05/2017  . HERNIA REPAIR    . HIP SURGERY     bilateral  . INCISIONAL HERNIA REPAIR N/A 12/18/2013   Procedure:  REPAIR OF INCARCERATED INCISIONAL HERNIA;  Surgeon: Rolm Bookbinder, MD;  Location: [place];  Service: General;  Laterality: N/A;  . INSERTION OF MESH N/A 12/18/2013   Procedure: INSERTION OF MESH;  Surgeon: Rolm Bookbinder, MD;  Location: [place];  Service: General;  Laterality: N/A;  . Sigmoid Colectomy  10/2005   [facility]  . TONSILLECTOMY     Social History   Social History  . Marital status: Legally Separated    Spouse name: N/A  . Number of children: 0   Occupational History  . Food Industry    Social History Main Topics  . Smoking status: Never Smoker  . Smokeless tobacco: Never Used  . Alcohol use Not on file     Comment: states he does not drink   . Drug use: No  . 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   In new relationship (04/2009) - girlfriend helps taste for cooking business and helps monitor for ulcers.   Regular Exercise- No         family history includes CAD in his mother; Congestive Heart Failure in his mother; Diabetes in his father and mother; Heart attack (age of onset: 49) in his brother; Heart failure in his brother; Hepatitis in his mother.   Review of Systems Uses a walker to ambulate.  Denies any chest pains but is having some dyspnea.  Objective:   Physical Exam BP 128/78   Pulse 73    Ht 6' (1.829 m)   Wt (!) 490 lb (222.3 kg)   BMI 66.46 kg/m  Morbidly pobese pleasant NAD Eyes are anicteric Lungs ctta ant Ht sounds NL abdobese w/ scars and ventral hernia and palpable lap band port lower upper abd to right of midline  Data reviewed, he has a chronic anemia with hemoglobin in the 10 and a normal MCV.

## 2017-07-11 NOTE — Transfer of Care (Signed)
Immediate Anesthesia Transfer of Care Note  Patient: [name]  Procedure(s) Performed: COLONOSCOPY WITH PROPOFOL (N/A )  Patient Location: PACU  Anesthesia Type:MAC  Level of Consciousness: awake, alert  and oriented  Airway & Oxygen Therapy: Patient Spontanous Breathing and Patient connected to nasal cannula oxygen  Post-op Assessment: Report given to RN  Post vital signs: Reviewed and stable  Last Vitals: There were no vitals filed for this visit.  Last Pain: There were no vitals filed for this visit.       Complications: No apparent anesthesia complications

## 2017-07-12 ENCOUNTER — Encounter (HOSPITAL_COMMUNITY): Payer: Self-pay | Admitting: Internal Medicine

## 2017-07-12 NOTE — Addendum Note (Signed)
Addendum  created 07/12/17 1032 by Josephine Igo, MD   Anesthesia Staff edited

## 2017-07-27 DIAGNOSIS — M150 Primary generalized (osteo)arthritis: Secondary | ICD-10-CM | POA: Diagnosis not present

## 2017-07-27 DIAGNOSIS — E1140 Type 2 diabetes mellitus with diabetic neuropathy, unspecified: Secondary | ICD-10-CM | POA: Diagnosis not present

## 2017-07-28 ENCOUNTER — Other Ambulatory Visit: Payer: Self-pay | Admitting: Internal Medicine

## 2017-07-28 ENCOUNTER — Telehealth: Payer: Self-pay | Admitting: Internal Medicine

## 2017-07-28 DIAGNOSIS — I10 Essential (primary) hypertension: Secondary | ICD-10-CM

## 2017-07-28 DIAGNOSIS — I5032 Chronic diastolic (congestive) heart failure: Secondary | ICD-10-CM

## 2017-07-28 NOTE — Telephone Encounter (Signed)
Patient Name: [name] DOB: 1956-04-17 Initial Comment Caller states he heard his knee cap pop yesterday, trouble walking. Nurse Assessment Nurse: Neena Rhymes, RN, Sharyn Lull Date/Time (Eastern Time): 07/28/2017 9:22:34 AM Confirm and document reason for call. If symptomatic, describe symptoms. ---Patient states she already has an appointment for Monday. He is requesting nurse to call medication in for him. I started to explain I could not do that and he stated "So you can't do anything for me." and hung up on me. Does the patient have any new or worsening symptoms? ---Yes Will a triage be completed? ---No Select reason for no triage. ---Other Please document clinical information provided and list any resource used. ---Caller hung up.

## 2017-07-31 ENCOUNTER — Encounter: Payer: Self-pay | Admitting: Family Medicine

## 2017-07-31 ENCOUNTER — Ambulatory Visit: Payer: Medicare HMO | Admitting: Family Medicine

## 2017-07-31 VITALS — BP 138/84 | HR 72 | Temp 98.0°F | Ht 72.0 in | Wt >= 6400 oz

## 2017-07-31 DIAGNOSIS — M25561 Pain in right knee: Secondary | ICD-10-CM | POA: Insufficient documentation

## 2017-07-31 DIAGNOSIS — M25562 Pain in left knee: Secondary | ICD-10-CM

## 2017-07-31 NOTE — Progress Notes (Signed)
[name] - 61 y.o. male MRN [id number]  Date of birth: 10/20/1955  SUBJECTIVE:  Including CC & ROS.  Chief Complaint  Patient presents with  . Left knee pain    He felt a pop in his knee on 07/28/17 when he tried to get up from the couch. He states it is painful to walk.     [name] is a 61 y.o. male that is presenting with left lateral knee pain. The pain started 3 days ago. The pain is sharp in nature. The pain is worse when he stands up.  He uses a motorized scooter to get around outside of the house.  While he is in his house, he felt a pop on the lateral aspect of his knee when he was transitioning from a seated position to a standing position.  He denies any history of knee problems.  He has not had any worsening of the pain.  Review of his blood work from 8/13 shows a creatinine of 2.15.   Review of Systems  Constitutional: Negative for fever.  Musculoskeletal: Positive for arthralgias and gait problem.  Skin: Negative for color change.  Neurological: Positive for weakness.  Hematological: Negative for adenopathy.    HISTORY: Past Medical, Surgical, Social, and Family History Reviewed & Updated per EMR.   Pertinent Historical Findings include:  Past Medical History:  Diagnosis Date  . Anemia    iron  . CHF (congestive heart failure) (HCC)    Presumed diastolic. Echo (06/07) w.EF 45%, severe posterior HK, mild LV hypertrophy, No further work-up of abnormal echo was done. pt denies CHF  . Colon cancer ([place]) 2007   s/p sigmoid colectomy  . Diabetes mellitus    Has Hx of diabetic foot ulcer & peripheral neuropathy  type2  . Dyspnea    w/ activity  . History of PFTs 05/2009   Mild Obstructive defect  . HTN (hypertension)   . Hyperlipidemia   . Morbid obesity ([place])   . OSA on CPAP   . Pneumonia    week ago     Past Surgical History:  Procedure Laterality Date  . BARIATRIC SURGERY  12/2009   Lap Band/At [facility]  . COLONOSCOPY  multiple  . [place]   '80/Right  '84/Left  . EYE SURGERY Right 05/2017  . HERNIA REPAIR    . HIP SURGERY     bilateral  . Sigmoid Colectomy  10/2005   [facility]  . TONSILLECTOMY      Allergies  Allergen Reactions  . Shellfish Allergy Anaphylaxis  . Lisinopril Cough    Family History  Problem Relation Age of Onset  . Hepatitis Mother        hepatitis C  . Diabetes Mother   . Congestive Heart Failure Mother   . CAD Mother   . Heart attack Brother 29  . Diabetes Father   . Heart failure Brother   . Stomach cancer Neg Hx   . Colon cancer Neg Hx      Social History   Socioeconomic History  . Marital status: Legally Separated    Spouse name: Not on file  . Number of children: 0  . Years of education: Not on file  . Highest education level: Not on file  Social Needs  . Financial resource strain: Not on file  . Food insecurity - worry: Not on file  . Food insecurity - inability: Not on file  . Transportation needs - medical: Not on  file  . Transportation needs - non-medical: Not on file  Occupational History  . Occupation: Human resources officer  Tobacco Use  . Smoking status: Never Smoker  . Smokeless tobacco: Never Used  Substance and Sexual Activity  . Alcohol use: Not on file    Comment: states he does not drink   . Drug use: No  . Sexual activity: Not Currently  Other Topics Concern  . 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   In new relationship (04/2009) - girlfriend helps taste for cooking business and helps monitor for ulcers.   Regular Exercise- No        PHYSICAL EXAM:  VS: BP 138/84 (BP Location: Right Arm, Patient Position: Sitting, Cuff Size: Normal)   Pulse 72   Temp 98 F (36.7 C) (Oral)   Ht 6' (1.829 m)   Wt (!) 484 lb (219.5 kg)    SpO2 99%   BMI 65.64 kg/m  Physical Exam Gen: NAD, alert, cooperative with exam, well-appearing ENT: normal lips, normal nasal mucosa,  Eye: normal EOM, normal conjunctiva and lids CV:  no edema, +2 pedal pulses   Resp: no accessory muscle use, non-labored,  GI: no masses or tenderness, no hernia  Skin: no rashes, no areas of induration  Neuro: normal tone, normal sensation to touch Psych:  normal insight, alert and oriented MSK:  Left knee: No obvious effusion. Some tenderness to palpation over the lateral femoral condyle and proximal lateral tibia. No significant instability with valgus or varus stress testing. Patient was seated in a motorized scooter. Neurovascularly intact  Limited ultrasound: Left knee:  No evidence of an effusion in the suprapatellar pouch. Normal-appearing patellar and quad tendon. Lateral joint space narrowing with outpouching of the lateral meniscus with degenerative changes of the joint line.  Summary: Chronic changes of the meniscus and degenerative changes of the lateral joint line  Ultrasound and interpretation by Clearance Coots, MD          ASSESSMENT & PLAN:   Acute pain of left knee Pain could be associated with underlying arthritis versus an acute change of a degenerative meniscus.  Does not appear to be associated with the LCL.  No mechanical obstructions today on exam. -Wrapped with an Ace wrap compression -Avoiding NSAIDs due to his renal function -Apply different topical creams -Counseled on home exercise therapy -If no improvement can consider injection

## 2017-07-31 NOTE — Assessment & Plan Note (Signed)
Pain could be associated with underlying arthritis versus an acute change of a degenerative meniscus.  Does not appear to be associated with the LCL.  No mechanical obstructions today on exam. -Wrapped with an Ace wrap compression -Avoiding NSAIDs due to his renal function -Apply different topical creams -Counseled on home exercise therapy -If no improvement can consider injection

## 2017-07-31 NOTE — Patient Instructions (Signed)
Thank you for coming in,   Take tylenol 650 mg three times a day is the best evidence based medicine we have for arthritis.   Glucosamine sulfate 750mg  twice a day is a supplement that has been shown to help moderate to severe arthritis.  Vitamin D 2000 IU daily  Fish oil 2 grams daily.   Tumeric 500mg  twice daily.   Capsaicin topically up to four times a day may also help with pain.  Cortisone injections are an option if these interventions do not seem to make a difference or need more relief.   If cortisone injections do not help, there are different types of shots that may help but they take longer to take effect.  We can discuss this at follow up.  It's important that you continue to stay active.  Controlling your weight is important.   Consider physical therapy to strengthen muscles around the joint that hurts to take pressure off of the joint itself.  Shoe inserts with good arch support may be helpful.  Spenco orthotics at [employer] could help.   Water aerobics and cycling with low resistance are the best two types of exercise for arthritis.  Come back and see me in 3 weeks.     Please feel free to call with any questions or concerns at any time, at [phone]. --Dr. Raeford Razor

## 2017-08-02 ENCOUNTER — Other Ambulatory Visit: Payer: Self-pay | Admitting: Internal Medicine

## 2017-08-02 ENCOUNTER — Telehealth: Payer: Self-pay | Admitting: Internal Medicine

## 2017-08-02 DIAGNOSIS — M159 Polyosteoarthritis, unspecified: Secondary | ICD-10-CM

## 2017-08-02 DIAGNOSIS — E1142 Type 2 diabetes mellitus with diabetic polyneuropathy: Secondary | ICD-10-CM

## 2017-08-02 DIAGNOSIS — M150 Primary generalized (osteo)arthritis: Secondary | ICD-10-CM

## 2017-08-02 DIAGNOSIS — E118 Type 2 diabetes mellitus with unspecified complications: Secondary | ICD-10-CM

## 2017-08-02 MED ORDER — HYDROCODONE-ACETAMINOPHEN 10-325 MG PO TABS: 1.0000 | ORAL_TABLET | Freq: Two times a day (BID) | ORAL | 0 refills | Status: DC

## 2017-08-02 NOTE — Telephone Encounter (Signed)
Pt called requesting a refill on HYDROcodone-acetaminophen (NORCO) 10-325 MG tablet.  °

## 2017-08-02 NOTE — Telephone Encounter (Signed)
Check [place] registry last filled 07/01/2017../lmb  

## 2017-08-04 ENCOUNTER — Telehealth: Payer: Self-pay | Admitting: Internal Medicine

## 2017-08-04 NOTE — Telephone Encounter (Signed)
He should be trying to contact them to resend it. He said that he would try to find the information.

## 2017-08-04 NOTE — Telephone Encounter (Signed)
I have not seen this form.  

## 2017-08-04 NOTE — Telephone Encounter (Signed)
I have not received this form.

## 2017-08-04 NOTE — Telephone Encounter (Signed)
Pt called stating that we should have received a form from a company that will be building a ramp for him at his home. They were needing a signature from Dr [name]. He was not sure the name of the company but said that they faxed it about 2 weeks ago. I told him that is he was able to find the information on the company, to call them and ask if they could fax it over again. Have you seen anything about this?

## 2017-08-04 NOTE — Telephone Encounter (Signed)
Per pharmacy fax - they only dispensed 45 of the 65 Norco that was prescribed due to Ins plan limitation.

## 2017-08-07 ENCOUNTER — Other Ambulatory Visit: Payer: Self-pay | Admitting: Cardiology

## 2017-08-07 DIAGNOSIS — I5032 Chronic diastolic (congestive) heart failure: Secondary | ICD-10-CM

## 2017-08-07 DIAGNOSIS — I10 Essential (primary) hypertension: Secondary | ICD-10-CM

## 2017-08-07 NOTE — Telephone Encounter (Signed)
Jaspal called and said he was going to have them fax over the forms again. The place is called:  Location rehabilitation assistant Fax: [phone]

## 2017-08-08 NOTE — Telephone Encounter (Signed)
Pt called asking if we could give him a call when we receive this form .

## 2017-08-21 ENCOUNTER — Other Ambulatory Visit: Payer: Self-pay | Admitting: Internal Medicine

## 2017-08-21 ENCOUNTER — Telehealth: Payer: Self-pay | Admitting: Internal Medicine

## 2017-08-21 DIAGNOSIS — M150 Primary generalized (osteo)arthritis: Secondary | ICD-10-CM

## 2017-08-21 DIAGNOSIS — M159 Polyosteoarthritis, unspecified: Secondary | ICD-10-CM

## 2017-08-21 DIAGNOSIS — E1142 Type 2 diabetes mellitus with diabetic polyneuropathy: Secondary | ICD-10-CM

## 2017-08-21 MED ORDER — HYDROCODONE-ACETAMINOPHEN 10-325 MG PO TABS: 1.0000 | ORAL_TABLET | Freq: Two times a day (BID) | ORAL | 0 refills | Status: DC

## 2017-08-21 NOTE — Telephone Encounter (Signed)
Pt called for a refill of his HYDROcodone-acetaminophen (NORCO) 10-325 MG tablet  Please advise I believe this is early Please call pt back in regard

## 2017-08-21 NOTE — Telephone Encounter (Signed)
Check [place] registry last filled 08/02/2017. Not due until 09/01/17...[name]

## 2017-08-21 NOTE — Telephone Encounter (Signed)
Still have not seen this form.

## 2017-08-23 NOTE — Telephone Encounter (Signed)
error 

## 2017-08-24 DIAGNOSIS — E113532 Type 2 diabetes mellitus with proliferative diabetic retinopathy with traction retinal detachment not involving the macula, left eye: Secondary | ICD-10-CM | POA: Diagnosis not present

## 2017-08-24 DIAGNOSIS — E113521 Type 2 diabetes mellitus with proliferative diabetic retinopathy with traction retinal detachment involving the macula, right eye: Secondary | ICD-10-CM | POA: Diagnosis not present

## 2017-08-28 DIAGNOSIS — R609 Edema, unspecified: Secondary | ICD-10-CM | POA: Diagnosis not present

## 2017-08-28 DIAGNOSIS — M150 Primary generalized (osteo)arthritis: Secondary | ICD-10-CM | POA: Diagnosis not present

## 2017-08-28 DIAGNOSIS — E139 Other specified diabetes mellitus without complications: Secondary | ICD-10-CM | POA: Diagnosis not present

## 2017-08-28 DIAGNOSIS — J449 Chronic obstructive pulmonary disease, unspecified: Secondary | ICD-10-CM | POA: Diagnosis not present

## 2017-08-28 DIAGNOSIS — G4733 Obstructive sleep apnea (adult) (pediatric): Secondary | ICD-10-CM | POA: Diagnosis not present

## 2017-08-28 DIAGNOSIS — M1610 Unilateral primary osteoarthritis, unspecified hip: Secondary | ICD-10-CM | POA: Diagnosis not present

## 2017-08-30 ENCOUNTER — Other Ambulatory Visit: Payer: Self-pay | Admitting: Internal Medicine

## 2017-08-30 DIAGNOSIS — E519 Thiamine deficiency, unspecified: Secondary | ICD-10-CM

## 2017-09-21 ENCOUNTER — Telehealth: Payer: Self-pay | Admitting: Internal Medicine

## 2017-09-21 NOTE — Telephone Encounter (Signed)
Patient is provided the information for the weight loss seminars at [facility].  I explained he will need to attend one of the seminars to begin the process for [facility]. All questions were answered.

## 2017-10-03 ENCOUNTER — Ambulatory Visit (INDEPENDENT_AMBULATORY_CARE_PROVIDER_SITE_OTHER): Payer: Medicare HMO | Admitting: Internal Medicine

## 2017-10-03 ENCOUNTER — Encounter: Payer: Self-pay | Admitting: Internal Medicine

## 2017-10-03 VITALS — BP 124/70 | HR 74 | Temp 98.6°F | Ht 72.0 in | Wt >= 6400 oz

## 2017-10-03 DIAGNOSIS — J988 Other specified respiratory disorders: Secondary | ICD-10-CM | POA: Diagnosis not present

## 2017-10-03 DIAGNOSIS — R059 Cough, unspecified: Secondary | ICD-10-CM

## 2017-10-03 DIAGNOSIS — R05 Cough: Secondary | ICD-10-CM | POA: Diagnosis not present

## 2017-10-03 LAB — POCT EXHALED NITRIC OXIDE: FeNO level (ppb): 14

## 2017-10-03 MED ORDER — PROMETHAZINE-DM 6.25-15 MG/5ML PO SYRP: 5.0000 mL | ORAL_SOLUTION | Freq: Four times a day (QID) | ORAL | 0 refills | Status: AC | PRN

## 2017-10-03 MED ORDER — AMOXICILLIN-POT CLAVULANATE 875-125 MG PO TABS: 1.0000 | ORAL_TABLET | Freq: Two times a day (BID) | ORAL | 0 refills | Status: AC

## 2017-10-03 NOTE — Patient Instructions (Signed)
Cough, Adult  Coughing is a reflex that clears your throat and your airways. Coughing helps to heal and protect your lungs. It is normal to cough occasionally, but a cough that happens with other symptoms or lasts a long time may be a sign of a condition that needs treatment. A cough may last only 2-3 weeks (acute), or it may last longer than 8 weeks (chronic).  What are the causes?  Coughing is commonly caused by:   Breathing in substances that irritate your lungs.   A viral or bacterial respiratory infection.   Allergies.   Asthma.   Postnasal drip.   Smoking.   Acid backing up from the stomach into the esophagus (gastroesophageal reflux).   Certain medicines.   Chronic lung problems, including COPD (or rarely, lung cancer).   Other medical conditions such as heart failure.    Follow these instructions at home:  Pay attention to any changes in your symptoms. Take these actions to help with your discomfort:   Take medicines only as told by your health care provider.  ? If you were prescribed an antibiotic medicine, take it as told by your health care provider. Do not stop taking the antibiotic even if you start to feel better.  ? Talk with your health care provider before you take a cough suppressant medicine.   Drink enough fluid to keep your urine clear or pale yellow.   If the air is dry, use a cold steam vaporizer or humidifier in your bedroom or your home to help loosen secretions.   Avoid anything that causes you to cough at work or at home.   If your cough is worse at night, try sleeping in a semi-upright position.   Avoid cigarette smoke. If you smoke, quit smoking. If you need help quitting, ask your health care provider.   Avoid caffeine.   Avoid alcohol.   Rest as needed.    Contact a health care provider if:   You have new symptoms.   You cough up pus.   Your cough does not get better after 2-3 weeks, or your cough gets worse.   You cannot control your cough with suppressant  medicines and you are losing sleep.   You develop pain that is getting worse or pain that is not controlled with pain medicines.   You have a fever.   You have unexplained weight loss.   You have night sweats.  Get help right away if:   You cough up blood.   You have difficulty breathing.   Your heartbeat is very fast.  This information is not intended to replace advice given to you by your health care provider. Make sure you discuss any questions you have with your health care provider.  Document Released: 03/04/2011 Document Revised: 02/11/2016 Document Reviewed: 11/12/2014  Elsevier Interactive Patient Education  2018 [employer]

## 2017-10-03 NOTE — Progress Notes (Signed)
Subjective:  Patient ID: [name], male    DOB: 05/07/1956  Age: 62 y.o. MRN: [id number]  CC: Cough   HPI [name] presents for a 4-week history of cough that is productive of thick yellow-green phlegm with shortness of breath and night sweats.  He denies chest pain, wheezing, hemoptysis, fever, or chills.  He has tried albuterol inhaler with no improvement in his symptoms.  Outpatient Medications Prior to Visit  Medication Sig Dispense Refill  . albuterol (PROVENTIL HFA;VENTOLIN HFA) 108 (90 Base) MCG/ACT inhaler Inhale 2 puffs into the lungs every 6 (six) hours as needed for wheezing or shortness of breath. 1 Inhaler 1  . aspirin EC 81 MG tablet Take 81 mg by mouth daily.    Marland Kitchen atorvastatin (LIPITOR) 40 MG tablet TAKE 1 TABLET BY MOUTH DAILY. 90 tablet 3  . bisoprolol (ZEBETA) 10 MG tablet TAKE 1 TABLET (10 MG TOTAL) BY MOUTH EVERY EVENING. (Patient taking differently: TAKE 1 TABLET (10 MG TOTAL) BY MOUTH EVERY MORNING.) 90 tablet 3  . Cholecalciferol 2000 units TABS Take 1 tablet (2,000 Units total) by mouth daily. 90 tablet 3  . cycloSPORINE (RESTASIS) 0.05 % ophthalmic emulsion Place 1 drop into both eyes 2 (two) times daily. 0.4 mL 5  . ferrous sulfate 325 (65 FE) MG tablet TAKE 1 TABLET BY MOUTH 2 TIMES DAILY WITH A MEAL. 180 tablet 2  . furosemide (LASIX) 80 MG tablet TAKE 1 TABLET BY MOUTH 2 TIMES DAILY. 60 tablet 3  . glucose blood (ONETOUCH VERIO) test strip USE  STRIP TO CHECK GLUCOSE THREE TIMES DAILY 100 each 5  . HYDROcodone-acetaminophen (NORCO) 10-325 MG tablet Take 1 tablet by mouth 2 (two) times daily. 75 tablet 0  . ibuprofen (ADVIL,MOTRIN) 200 MG tablet Take 200 mg by mouth at bedtime.    . Insulin Detemir (LEVEMIR FLEXTOUCH) 100 UNIT/ML Pen Inject 240 Units into the skin every morning. 75 mL 5  . levothyroxine (SYNTHROID, LEVOTHROID) 50 MCG tablet TAKE 1 TABLET BY MOUTH DAILY BEFORE BREAKFAST. 90 tablet 3  . losartan (COZAAR) 25 MG tablet Take 2 tablets (50 mg  total) by mouth daily. 180 tablet 3  . omeprazole (PRILOSEC) 40 MG capsule Take 1 tablet by mouth 30 min before breakfast. 90 capsule 3  . polyethylene glycol powder (GLYCOLAX/MIRALAX) powder TAKE 17 GRAMS BY MOUTH DAILY AS NEEDED FOR MILD CONSTIPATION. 527 g 11  . potassium chloride SA (K-DUR,KLOR-CON) 20 MEQ tablet TAKE 1 TABLET BY MOUTH DAILY. 30 tablet 3  . SYNJARDY 01-999 MG TABS TAKE 1 TABLET BY MOUTH 2 TIMES DAILY. 60 tablet 3  . thiamine (VITAMIN B-1) 100 MG tablet TAKE 1 TABLET BY MOUTH DAILY. 90 tablet 1  . ULTICARE SHORT PEN NEEDLES 31G X 8 MM MISC USE WITH INSULIN PEN DAILY. 100 each 3  . vitamin B-6 (PYRIDOXINE) 25 MG tablet Take 1 tablet (25 mg total) by mouth daily. 90 tablet 1  . vitamin C (ASCORBIC ACID) 500 MG tablet Take 1,000 mg by mouth daily. Reported on 01/19/2016    . vitamin E 200 UNIT capsule Take 400 Units by mouth daily. Reported on 01/19/2016    . zinc gluconate 50 MG tablet Take 1 tablet (50 mg total) by mouth daily. Reported on 01/19/2016 90 tablet 1  . zolpidem (AMBIEN) 10 MG tablet Take 1 tablet (10 mg total) by mouth at bedtime as needed for sleep. 30 tablet 0   No facility-administered medications prior to visit.     ROS Review  of Systems  Constitutional: Negative for chills, diaphoresis, fatigue and fever.  HENT: Negative.   Eyes: Negative.   Respiratory: Positive for cough and shortness of breath. Negative for chest tightness and wheezing.   Cardiovascular: Negative for chest pain and leg swelling.  Gastrointestinal: Negative for abdominal pain, diarrhea, nausea and vomiting.  Endocrine: Negative.   Genitourinary: Negative.  Negative for difficulty urinating.  Musculoskeletal: Positive for arthralgias.  Skin: Negative.  Negative for color change, pallor and rash.  Allergic/Immunologic: Negative.   Neurological: Negative.   Hematological: Negative for adenopathy. Does not bruise/bleed easily.  Psychiatric/Behavioral: Negative.     Objective:  BP 124/70  (BP Location: Left Arm, Patient Position: Sitting, Cuff Size: Large)   Pulse 74   Temp 98.6 F (37 C) (Oral)   Ht 6' (1.829 m)   Wt (!) 504 lb (228.6 kg)   SpO2 92%   BMI 68.35 kg/m   BP Readings from Last 3 Encounters:  10/03/17 124/70  07/31/17 138/84  07/11/17 (!) 163/84    Wt Readings from Last 3 Encounters:  10/03/17 (!) 504 lb (228.6 kg)  07/31/17 (!) 484 lb (219.5 kg)  07/05/17 (!) 490 lb (222.3 kg)    Physical Exam  Constitutional: He is oriented to person, place, and time.  Non-toxic appearance. He does not have a sickly appearance. He appears ill ([place]). No distress.  HENT:  Mouth/Throat: Oropharynx is clear and moist. No oropharyngeal exudate.  Eyes: Conjunctivae are normal. Left eye exhibits no discharge.  Neck: Normal range of motion. Neck supple.  Cardiovascular: Normal rate, regular rhythm and normal heart sounds.  No murmur heard. Pulmonary/Chest: Effort normal. No accessory muscle usage. No tachypnea. No respiratory distress. He has decreased breath sounds in the right upper field, the right middle field, the right lower field, the left upper field, the left middle field and the left lower field. He has no wheezes. He has no rhonchi. He has no rales.  Abdominal: Soft. Bowel sounds are normal. He exhibits no mass. There is no tenderness.  Musculoskeletal: Normal range of motion. He exhibits no edema or tenderness.  Lymphadenopathy:    He has no cervical adenopathy.  Neurological: He is alert and oriented to person, place, and time.  Skin: Skin is warm and dry. No rash noted. He is not diaphoretic. No erythema. No pallor.  Psychiatric: He has a normal mood and affect. His behavior is normal. Judgment and thought content normal.  Vitals reviewed.   Lab Results  Component Value Date   WBC 10.6 (H) 07/05/2017   HGB 10.6 (L) 07/05/2017   HCT 33.3 (L) 07/05/2017   PLT 276.0 07/05/2017   GLUCOSE 79 05/01/2017   CHOL 133 02/24/2017   TRIG 111 02/24/2017   HDL  27 (L) 02/24/2017   LDLDIRECT 136.8 12/12/2006   LDLCALC 84 02/24/2017   ALT 18 05/01/2017   AST 20 05/01/2017   NA 142 05/01/2017   K 4.4 05/01/2017   CL 104 05/01/2017   CREATININE 2.15 (H) 05/01/2017   BUN 38 (H) 05/01/2017   CO2 28 05/01/2017   TSH 2.77 05/01/2017   PSA 1.29 08/02/2016   HGBA1C 8.7 (H) 05/01/2017   MICROALBUR <0.7 05/01/2017    No results found.  Assessment & Plan:   [name] was seen today for cough.  Diagnoses and all orders for this visit:  Cough- His FeNO score is not elevated so I do not think this is a component of asthma, inflammatory, or eosinophilic process and therefore did  not recommended inhaled or systemic steroids.  Will check a chest x-ray to see if there is edema, mass, or infiltrate. -     promethazine-dextromethorphan (PROMETHAZINE-DM) 6.25-15 MG/5ML syrup; Take 5 mLs by mouth 4 (four) times daily as needed for up to 7 days for cough. -     DG Chest 2 View; Future -     POCT EXHALED NITRIC OXIDE  RTI (respiratory tract infection)- Will empirically treat with Augmentin and will control the cough with Phenergan DM. -     amoxicillin-clavulanate (AUGMENTIN) 875-125 MG tablet; Take 1 tablet by mouth 2 (two) times daily for 10 days. -     promethazine-dextromethorphan (PROMETHAZINE-DM) 6.25-15 MG/5ML syrup; Take 5 mLs by mouth 4 (four) times daily as needed for up to 7 days for cough.   I am having [name] start on amoxicillin-clavulanate and promethazine-dextromethorphan. I am also having him maintain his vitamin C, vitamin E, cycloSPORINE, polyethylene glycol powder, levothyroxine, bisoprolol, ferrous sulfate, ULTICARE SHORT PEN NEEDLES, Cholecalciferol, vitamin B-6, zinc gluconate, atorvastatin, omeprazole, losartan, Insulin Detemir, glucose blood, zolpidem, albuterol, aspirin EC, ibuprofen, potassium chloride SA, SYNJARDY, furosemide, HYDROcodone-acetaminophen, and thiamine.  Meds ordered this encounter  Medications  .  amoxicillin-clavulanate (AUGMENTIN) 875-125 MG tablet    Sig: Take 1 tablet by mouth 2 (two) times daily for 10 days.    Dispense:  20 tablet    Refill:  0  . promethazine-dextromethorphan (PROMETHAZINE-DM) 6.25-15 MG/5ML syrup    Sig: Take 5 mLs by mouth 4 (four) times daily as needed for up to 7 days for cough.    Dispense:  118 mL    Refill:  0     Follow-up: Return in about 3 weeks (around 10/24/2017).  Scarlette Calico, MD

## 2017-10-04 ENCOUNTER — Telehealth: Payer: Self-pay | Admitting: Internal Medicine

## 2017-10-04 NOTE — Telephone Encounter (Signed)
Copied from [place] [id number]. Topic: Inquiry >> Oct 04, 2017  2:21 PM Malena Catholic I, NT wrote: Reason for CRM: Pt call and state his a prescription for Bed,shoe, lift chair,

## 2017-10-04 NOTE — Telephone Encounter (Signed)
Called patient to clarify.[name] He is needing written orders that he can come by to pick up for a Bed shoe (orthapiedic shoe) and a Teacher, English as a foreign language (recliner type). Can this be done for him? Please advise.

## 2017-10-05 NOTE — Telephone Encounter (Signed)
He needs an appt to be evaluated specifically for those items. His last appt was due to illness.

## 2017-10-06 NOTE — Telephone Encounter (Signed)
LM letting pt know °

## 2017-10-10 ENCOUNTER — Ambulatory Visit: Payer: Medicare HMO | Admitting: Internal Medicine

## 2017-10-17 ENCOUNTER — Other Ambulatory Visit (INDEPENDENT_AMBULATORY_CARE_PROVIDER_SITE_OTHER): Payer: Medicare HMO

## 2017-10-17 ENCOUNTER — Ambulatory Visit (INDEPENDENT_AMBULATORY_CARE_PROVIDER_SITE_OTHER): Payer: Medicare HMO | Admitting: Internal Medicine

## 2017-10-17 ENCOUNTER — Encounter: Payer: Self-pay | Admitting: Internal Medicine

## 2017-10-17 VITALS — BP 130/60 | HR 76 | Temp 97.7°F | Ht 72.0 in | Wt >= 6400 oz

## 2017-10-17 DIAGNOSIS — N400 Enlarged prostate without lower urinary tract symptoms: Secondary | ICD-10-CM | POA: Diagnosis not present

## 2017-10-17 DIAGNOSIS — R600 Localized edema: Secondary | ICD-10-CM

## 2017-10-17 DIAGNOSIS — Z9884 Bariatric surgery status: Secondary | ICD-10-CM | POA: Diagnosis not present

## 2017-10-17 DIAGNOSIS — E039 Hypothyroidism, unspecified: Secondary | ICD-10-CM | POA: Diagnosis not present

## 2017-10-17 DIAGNOSIS — M150 Primary generalized (osteo)arthritis: Secondary | ICD-10-CM

## 2017-10-17 DIAGNOSIS — D539 Nutritional anemia, unspecified: Secondary | ICD-10-CM

## 2017-10-17 DIAGNOSIS — E538 Deficiency of other specified B group vitamins: Secondary | ICD-10-CM | POA: Insufficient documentation

## 2017-10-17 DIAGNOSIS — E118 Type 2 diabetes mellitus with unspecified complications: Secondary | ICD-10-CM | POA: Diagnosis not present

## 2017-10-17 DIAGNOSIS — E1142 Type 2 diabetes mellitus with diabetic polyneuropathy: Secondary | ICD-10-CM

## 2017-10-17 DIAGNOSIS — E559 Vitamin D deficiency, unspecified: Secondary | ICD-10-CM | POA: Diagnosis not present

## 2017-10-17 DIAGNOSIS — D500 Iron deficiency anemia secondary to blood loss (chronic): Secondary | ICD-10-CM

## 2017-10-17 DIAGNOSIS — R7989 Other specified abnormal findings of blood chemistry: Secondary | ICD-10-CM

## 2017-10-17 DIAGNOSIS — Z6841 Body Mass Index (BMI) 40.0 and over, adult: Secondary | ICD-10-CM

## 2017-10-17 DIAGNOSIS — M159 Polyosteoarthritis, unspecified: Secondary | ICD-10-CM

## 2017-10-17 LAB — COMPREHENSIVE METABOLIC PANEL
ALBUMIN: 3.6 g/dL (ref 3.5–5.2)
ALK PHOS: 118 U/L — AB (ref 39–117)
ALT: 12 U/L (ref 0–53)
AST: 16 U/L (ref 0–37)
BUN: 37 mg/dL — AB (ref 6–23)
CO2: 32 mEq/L (ref 19–32)
CREATININE: 2.56 mg/dL — AB (ref 0.40–1.50)
Calcium: 9 mg/dL (ref 8.4–10.5)
Chloride: 101 mEq/L (ref 96–112)
GFR: 33.14 mL/min — ABNORMAL LOW (ref >60.00)
GLUCOSE: 176 mg/dL — AB (ref 70–99)
Potassium: 4.3 mEq/L (ref 3.5–5.1)
SODIUM: 142 meq/L (ref 135–145)
TOTAL PROTEIN: 7.3 g/dL (ref 6.0–8.3)
Total Bilirubin: 0.3 mg/dL (ref 0.2–1.2)

## 2017-10-17 LAB — BASIC METABOLIC PANEL
BUN: 37 mg/dL — ABNORMAL HIGH (ref 6–23)
CALCIUM: 9 mg/dL (ref 8.4–10.5)
CO2: 32 mEq/L (ref 19–32)
CREATININE: 2.56 mg/dL — AB (ref 0.40–1.50)
Chloride: 101 mEq/L (ref 96–112)
GFR: 33.14 mL/min — ABNORMAL LOW (ref >60.00)
Glucose, Bld: 176 mg/dL — ABNORMAL HIGH (ref 70–99)
Potassium: 4.3 mEq/L (ref 3.5–5.1)
SODIUM: 142 meq/L (ref 135–145)

## 2017-10-17 LAB — IBC PANEL
Iron: 50 ug/dL (ref 42–165)
Saturation Ratios: 16.8 % — ABNORMAL LOW (ref 20.0–50.0)
Transferrin: 213 mg/dL (ref 212.0–360.0)

## 2017-10-17 LAB — VITAMIN D 25 HYDROXY (VIT D DEFICIENCY, FRACTURES): VITD: 26.76 ng/mL — AB (ref 30.00–100.00)

## 2017-10-17 LAB — BRAIN NATRIURETIC PEPTIDE: PRO B NATRI PEPTIDE: 65 pg/mL (ref 0.0–100.0)

## 2017-10-17 LAB — FOLATE: FOLATE: 8.5 ng/mL (ref >5.9)

## 2017-10-17 LAB — PSA: PSA: 1.49 ng/mL (ref 0.10–4.00)

## 2017-10-17 LAB — VITAMIN B12: VITAMIN B 12: 230 pg/mL (ref 211–911)

## 2017-10-17 LAB — FERRITIN: FERRITIN: 311.4 ng/mL (ref 22.0–322.0)

## 2017-10-17 LAB — HEMOGLOBIN A1C: Hgb A1c MFr Bld: 8.5 % — ABNORMAL HIGH (ref 4.6–6.5)

## 2017-10-17 LAB — TSH: TSH: 3.45 u[IU]/mL (ref 0.35–4.50)

## 2017-10-17 MED ORDER — HYDROCODONE-ACETAMINOPHEN 10-325 MG PO TABS: 1.0000 | ORAL_TABLET | Freq: Two times a day (BID) | ORAL | 0 refills | Status: DC

## 2017-10-17 MED ORDER — CHOLECALCIFEROL 50 MCG (2000 UT) PO TABS: 1.0000 | ORAL_TABLET | Freq: Every day | ORAL | 1 refills | Status: DC

## 2017-10-17 MED ORDER — CYANOCOBALAMIN 2000 MCG PO TABS: 2000.0000 ug | ORAL_TABLET | Freq: Every day | ORAL | 1 refills | Status: DC

## 2017-10-17 NOTE — Progress Notes (Addendum)
Subjective:  Patient ID: [name], male    DOB: Dec 14, 1955  Age: 62 y.o. MRN: [id number]  CC: Hypothyroidism; Anemia; and Diabetes   HPI Drakkar Medeiros presents for f/up - He complains of a several week history of swelling, mostly in his left lower extremity but also in both hands and his right lower extremity.  He denies chest pain, shortness of breath, or weight gain.  Outpatient Medications Prior to Visit  Medication Sig Dispense Refill  . albuterol (PROVENTIL HFA;VENTOLIN HFA) 108 (90 Base) MCG/ACT inhaler Inhale 2 puffs into the lungs every 6 (six) hours as needed for wheezing or shortness of breath. 1 Inhaler 1  . aspirin EC 81 MG tablet Take 81 mg by mouth daily.    Marland Kitchen aspirin EC 81 MG tablet     . atorvastatin (LIPITOR) 40 MG tablet TAKE 1 TABLET BY MOUTH DAILY. 90 tablet 3  . bisoprolol (ZEBETA) 10 MG tablet TAKE 1 TABLET (10 MG TOTAL) BY MOUTH EVERY EVENING. (Patient taking differently: TAKE 1 TABLET (10 MG TOTAL) BY MOUTH EVERY MORNING.) 90 tablet 3  . cycloSPORINE (RESTASIS) 0.05 % ophthalmic emulsion Place 1 drop into both eyes 2 (two) times daily. 0.4 mL 5  . ferrous sulfate 325 (65 FE) MG tablet TAKE 1 TABLET BY MOUTH 2 TIMES DAILY WITH A MEAL. 180 tablet 2  . furosemide (LASIX) 80 MG tablet TAKE 1 TABLET BY MOUTH 2 TIMES DAILY. 60 tablet 3  . glucose blood (ONETOUCH VERIO) test strip USE  STRIP TO CHECK GLUCOSE THREE TIMES DAILY 100 each 5  . ibuprofen (ADVIL,MOTRIN) 200 MG tablet Take 200 mg by mouth at bedtime.    Marland Kitchen levothyroxine (SYNTHROID, LEVOTHROID) 50 MCG tablet TAKE 1 TABLET BY MOUTH DAILY BEFORE BREAKFAST. 90 tablet 3  . losartan (COZAAR) 25 MG tablet Take 2 tablets (50 mg total) by mouth daily. 180 tablet 3  . omeprazole (PRILOSEC) 40 MG capsule Take 1 tablet by mouth 30 min before breakfast. 90 capsule 3  . polyethylene glycol powder (GLYCOLAX/MIRALAX) powder TAKE 17 GRAMS BY MOUTH DAILY AS NEEDED FOR MILD CONSTIPATION. 527 g 11  . potassium chloride SA  (K-DUR,KLOR-CON) 20 MEQ tablet TAKE 1 TABLET BY MOUTH DAILY. 30 tablet 3  . SYNJARDY 01-999 MG TABS TAKE 1 TABLET BY MOUTH 2 TIMES DAILY. 60 tablet 3  . thiamine (VITAMIN B-1) 100 MG tablet TAKE 1 TABLET BY MOUTH DAILY. 90 tablet 1  . ULTICARE SHORT PEN NEEDLES 31G X 8 MM MISC USE WITH INSULIN PEN DAILY. 100 each 3  . vitamin B-6 (PYRIDOXINE) 25 MG tablet Take 1 tablet (25 mg total) by mouth daily. 90 tablet 1  . vitamin C (ASCORBIC ACID) 500 MG tablet Take 1,000 mg by mouth daily. Reported on 01/19/2016    . vitamin E 200 UNIT capsule Take 400 Units by mouth daily. Reported on 01/19/2016    . Vitamins A & D (VITAMIN A & D) 10000-400 units TABS     . zinc gluconate 50 MG tablet Take 1 tablet (50 mg total) by mouth daily. Reported on 01/19/2016 90 tablet 1  . Cholecalciferol 2000 units TABS Take 1 tablet (2,000 Units total) by mouth daily. 90 tablet 3  . HYDROcodone-acetaminophen (NORCO) 10-325 MG tablet Take 1 tablet by mouth 2 (two) times daily. 75 tablet 0  . Insulin Detemir (LEVEMIR FLEXTOUCH) 100 UNIT/ML Pen Inject 240 Units into the skin every morning. 75 mL 5  . thiamine (VITAMIN B-1) 100 MG tablet   1  .  Multiple Vitamins-Minerals (MULTIVITAMIN ADULT PO) Take by mouth.    . zolpidem (AMBIEN) 10 MG tablet Take 1 tablet (10 mg total) by mouth at bedtime as needed for sleep. 30 tablet 0   No facility-administered medications prior to visit.     ROS Review of Systems  Constitutional: Negative for activity change, diaphoresis, fatigue and unexpected weight change.  HENT: Negative.   Eyes: Negative for visual disturbance.  Respiratory: Negative for cough, chest tightness, shortness of breath and wheezing.   Cardiovascular: Positive for leg swelling. Negative for chest pain and palpitations.  Gastrointestinal: Negative for abdominal pain, blood in stool, constipation, diarrhea, nausea and vomiting.  Endocrine: Negative.  Negative for cold intolerance and heat intolerance.  Genitourinary:  Negative.  Negative for difficulty urinating.  Musculoskeletal: Positive for arthralgias. Negative for myalgias.  Skin: Negative.  Negative for color change and pallor.  Allergic/Immunologic: Negative.   Neurological: Negative.  Negative for dizziness, weakness and light-headedness.  Hematological: Negative for adenopathy. Does not bruise/bleed easily.    Objective:  BP 130/60 (BP Location: Right Arm, Patient Position: Sitting, Cuff Size: Large)   Pulse 76   Temp 97.7 F (36.5 C) (Oral)   Ht 6' (1.829 m)   Wt (!) 508 lb 12 oz (230.8 kg)   SpO2 90%   BMI 69.00 kg/m   BP Readings from Last 3 Encounters:  10/17/17 130/60  10/03/17 124/70  07/31/17 138/84    Wt Readings from Last 3 Encounters:  10/17/17 (!) 508 lb 12 oz (230.8 kg)  10/03/17 (!) 504 lb (228.6 kg)  07/31/17 (!) 484 lb (219.5 kg)    Physical Exam  Constitutional: He is oriented to person, place, and time. No distress.  HENT:  Mouth/Throat: Oropharynx is clear and moist. No oropharyngeal exudate.  Eyes: Conjunctivae are normal. Left eye exhibits no discharge. No scleral icterus.  Neck: Normal range of motion. Neck supple. No JVD present. No thyromegaly present.  Cardiovascular: Normal rate, regular rhythm and normal heart sounds. Exam reveals no gallop.  No murmur heard. Pulmonary/Chest: Effort normal and breath sounds normal. No respiratory distress. He has no wheezes. He has no rales.  Abdominal: Soft. He exhibits no mass.  Musculoskeletal: He exhibits edema. He exhibits no tenderness or deformity.  Trace pitting edema in LLE No edema elsewhere  Lymphadenopathy:    He has no cervical adenopathy.  Neurological: He is alert and oriented to person, place, and time.  Skin: Skin is warm and dry. No rash noted. He is not diaphoretic. No erythema. No pallor.  Diabetic Foot Form - Detailed   Diabetic Foot Exam - detailed Diabetic Foot exam was performed with the following findings:  Yes  10/17/2017  8:54 AM    Visual Foot Exam completed.:  Yes  Can the patient see the bottom of their feet?:  No Are the shoes appropriate in style and fit?:  Yes Is there swelling or and abnormal foot shape?:  No Is there a claw toe deformity?:  Yes Is there elevated skin temparature?:  No Is there foot or ankle muscle weakness?:  No Normal Range of Motion:  Yes Pulse Foot Exam completed.:  Yes  Right posterior Tibialias:  Diminished Left posterior Tibialias:   Diminished  Right Dorsalis Pedis:  Diminished Left Dorsalis Pedis:  Diminished  Sensory Foot Exam Completed.:  Yes Semmes-Weinstein Monofilament Test      Vitals reviewed.   Lab Results  Component Value Date   WBC 10.6 (H) 07/05/2017   HGB 10.6 (L) 07/05/2017  HCT 33.3 (L) 07/05/2017   PLT 276.0 07/05/2017   GLUCOSE 176 (H) 10/17/2017   GLUCOSE 176 (H) 10/17/2017   CHOL 133 02/24/2017   TRIG 111 02/24/2017   HDL 27 (L) 02/24/2017   LDLDIRECT 136.8 12/12/2006   LDLCALC 84 02/24/2017   ALT 12 10/17/2017   AST 16 10/17/2017   NA 142 10/17/2017   NA 142 10/17/2017   K 4.3 10/17/2017   K 4.3 10/17/2017   CL 101 10/17/2017   CL 101 10/17/2017   CREATININE 2.56 (H) 10/17/2017   CREATININE 2.56 (H) 10/17/2017   BUN 37 (H) 10/17/2017   BUN 37 (H) 10/17/2017   CO2 32 10/17/2017   CO2 32 10/17/2017   TSH 3.45 10/17/2017   PSA 1.49 10/17/2017   HGBA1C 8.5 (H) 10/17/2017   MICROALBUR <0.7 05/01/2017    No results found.  Assessment & Plan:   Macio was seen today for hypothyroidism, anemia and diabetes.  Diagnoses and all orders for this visit:  Type 2 diabetes mellitus with complication, without long-term current use of insulin ([place])- His A1c is up to 8.5%.  Will restart basal insulin.  Will continue the SGLT-2 inhibitor and metformin. -     Basic metabolic panel; Future -     Comprehensive metabolic panel; Future -     Hemoglobin A1c; Future -     DME Other see comment -     DME Other see comment -     Insulin Detemir (LEVEMIR  FLEXTOUCH) 100 UNIT/ML Pen; Inject 240 Units into the skin every morning.  Acquired hypothyroidism- His TSH is in the normal range.  Will continue the current dose of levothyroxine. -     TSH; Future  Benign prostatic hyperplasia without lower urinary tract symptoms- He refused a GU examination today.  His PSA is normal which is reassuring that he does not have prostate cancer.  He does not have any symptoms that need to be treated. -     PSA; Future  Bariatric surgery status -     IBC panel; Future -     Vitamin B12; Future -     Ferritin; Future -     Folate; Future -     Zinc; Future -     VITAMIN D 25 Hydroxy (Vit-D Deficiency, Fractures); Future -     cyanocobalamin 2000 MCG tablet; Take 1 tablet (2,000 mcg total) by mouth daily. -     Cholecalciferol 2000 units TABS; Take 1 tablet (2,000 Units total) by mouth daily.  Iron deficiency anemia due to chronic blood loss -     IBC panel; Future -     Vitamin B12; Future -     Ferritin; Future -     Folate; Future  Deficiency anemia -     IBC panel; Future -     Vitamin B12; Future -     Folate; Future -     Zinc; Future -     VITAMIN D 25 Hydroxy (Vit-D Deficiency, Fractures); Future  Primary osteoarthritis involving multiple joints -     DME Hospital bed -     DME Other see comment -     DME Other see comment -     HYDROcodone-acetaminophen (NORCO) 10-325 MG tablet; Take 1 tablet by mouth 2 (two) times daily.  Class 3 severe obesity with serious comorbidity and body mass index (BMI) of 60.0 to 69.9 in adult, unspecified obesity type [facility]) -     DME Hospital bed -  DME Other see comment -     DME Other see comment  Leg edema, left- He has left lower extremity edema and a mildly elevated d-dimer.  I have asked him to undergo an ultrasound to screen for DVT.  His BNP is normal which is reassuring that he is not experiencing heart failure or fluid overload. -     Brain natriuretic peptide; Future -     D-dimer,  quantitative (not at [facility]); Future -     VAS [place] LOWER EXTREMITY VENOUS (DVT); Future  Diabetic peripheral neuropathy (HCC) -     HYDROcodone-acetaminophen (NORCO) 10-325 MG tablet; Take 1 tablet by mouth 2 (two) times daily.  Vitamin D deficiency -     Cholecalciferol 2000 units TABS; Take 1 tablet (2,000 Units total) by mouth daily.  B12 deficiency -     cyanocobalamin 2000 MCG tablet; Take 1 tablet (2,000 mcg total) by mouth daily.  D-dimer, elevated -     VAS [place] LOWER EXTREMITY VENOUS (DVT); Future   I am having Gavriel Kosier start on cyanocobalamin. I am also having him maintain his vitamin C, vitamin E, cycloSPORINE, polyethylene glycol powder, levothyroxine, bisoprolol, ferrous sulfate, ULTICARE SHORT PEN NEEDLES, vitamin B-6, zinc gluconate, atorvastatin, omeprazole, losartan, glucose blood, zolpidem, albuterol, aspirin EC, ibuprofen, potassium chloride SA, SYNJARDY, furosemide, thiamine, Multiple Vitamins-Minerals (MULTIVITAMIN ADULT PO), aspirin EC, Vitamin A & D, HYDROcodone-acetaminophen, Cholecalciferol, and Insulin Detemir.  Meds ordered this encounter  Medications  . HYDROcodone-acetaminophen (NORCO) 10-325 MG tablet    Sig: Take 1 tablet by mouth 2 (two) times daily.    Dispense:  75 tablet    Refill:  0  . cyanocobalamin 2000 MCG tablet    Sig: Take 1 tablet (2,000 mcg total) by mouth daily.    Dispense:  90 tablet    Refill:  1  . Cholecalciferol 2000 units TABS    Sig: Take 1 tablet (2,000 Units total) by mouth daily.    Dispense:  90 tablet    Refill:  1  . Insulin Detemir (LEVEMIR FLEXTOUCH) 100 UNIT/ML Pen    Sig: Inject 240 Units into the skin every morning.    Dispense:  75 mL    Refill:  5    Needs appointment.     Follow-up: Return in about 3 months (around 01/15/2018).  Scarlette Calico, MD

## 2017-10-17 NOTE — Patient Instructions (Signed)
Anemia Anemia is a condition in which you do not have enough red blood cells or hemoglobin. Hemoglobin is a substance in red blood cells that carries oxygen. When you do not have enough red blood cells or hemoglobin (are anemic), your body cannot get enough oxygen and your organs may not work properly. As a result, you may feel very tired or have other problems. What are the causes? Common causes of anemia include:  Excessive bleeding. Anemia can be caused by excessive bleeding inside or outside the body, including bleeding from the intestine or from periods in women.  Poor nutrition.  Long-lasting (chronic) kidney, thyroid, and liver disease.  Bone marrow disorders.  Cancer and treatments for cancer.  HIV (human immunodeficiency virus) and AIDS (acquired immunodeficiency syndrome).  Treatments for HIV and AIDS.  Spleen problems.  Blood disorders.  Infections, medicines, and autoimmune disorders that destroy red blood cells.  What are the signs or symptoms? Symptoms of this condition include:  Minor weakness.  Dizziness.  Headache.  Feeling heartbeats that are irregular or faster than normal (palpitations).  Shortness of breath, especially with exercise.  Paleness.  Cold sensitivity.  Indigestion.  Nausea.  Difficulty sleeping.  Difficulty concentrating.  Symptoms may occur suddenly or develop slowly. If your anemia is mild, you may not have symptoms. How is this diagnosed? This condition is diagnosed based on:  Blood tests.  Your medical history.  A physical exam.  Bone marrow biopsy.  Your health care provider may also check your stool (feces) for blood and may do additional testing to look for the cause of your bleeding. You may also have other tests, including:  Imaging tests, such as a CT scan or MRI.  Endoscopy.  Colonoscopy.  How is this treated? Treatment for this condition depends on the cause. If you continue to lose a lot of blood,  you may need to be treated at a hospital. Treatment may include:  Taking supplements of iron, vitamin B12, or folic acid.  Taking a hormone medicine (erythropoietin) that can help to stimulate red blood cell growth.  Having a blood transfusion. This may be needed if you lose a lot of blood.  Making changes to your diet.  Having surgery to remove your spleen.  Follow these instructions at home:  Take over-the-counter and prescription medicines only as told by your health care provider.  Take supplements only as told by your health care provider.  Follow any diet instructions that you were given.  Keep all follow-up visits as told by your health care provider. This is important. Contact a health care provider if:  You develop new bleeding anywhere in the body. Get help right away if:  You are very weak.  You are short of breath.  You have pain in your abdomen or chest.  You are dizzy or feel faint.  You have trouble concentrating.  You have bloody or black, tarry stools.  You vomit repeatedly or you vomit up blood. Summary  Anemia is a condition in which you do not have enough red blood cells or enough of a substance in your red blood cells that carries oxygen (hemoglobin).  Symptoms may occur suddenly or develop slowly.  If your anemia is mild, you may not have symptoms.  This condition is diagnosed with blood tests as well as a medical history and physical exam. Other tests may be needed.  Treatment for this condition depends on the cause of the anemia. This information is not intended to replace advice   given to you by your health care provider. Make sure you discuss any questions you have with your health care provider. Document Released: 10/13/2004 Document Revised: 10/07/2016 Document Reviewed: 10/07/2016 Elsevier Interactive Patient Education  [employer].

## 2017-10-18 ENCOUNTER — Telehealth: Payer: Self-pay | Admitting: Internal Medicine

## 2017-10-18 ENCOUNTER — Encounter: Payer: Self-pay | Admitting: Internal Medicine

## 2017-10-18 DIAGNOSIS — R7989 Other specified abnormal findings of blood chemistry: Secondary | ICD-10-CM | POA: Insufficient documentation

## 2017-10-18 MED ORDER — INSULIN DETEMIR 100 UNIT/ML FLEXPEN: 240.0000 [IU] | PEN_INJECTOR | SUBCUTANEOUS | 5 refills | Status: DC

## 2017-10-18 NOTE — Telephone Encounter (Signed)
Patient reports LUQ pain.  He is offered an appt for 10/24/17 with Tye Savoy RNP he adamantly refuses.  He says he will only see the doctor.  I encouraged him to come earlier as Dr. Celesta Aver next available is 12/11/17, he refused that appt because it was late in the day.  He will come in and see Dr. [name] on 12/13/17 8:30

## 2017-10-18 NOTE — Telephone Encounter (Signed)
Patient calling stating his stomach still hurts and he wants to speak with a nurse.

## 2017-10-19 LAB — D-DIMER, QUANTITATIVE (NOT AT ARMC): D DIMER QUANT: 0.74 ug{FEU}/mL — AB (ref <0.50)

## 2017-10-19 LAB — ZINC: Zinc: 62 ug/dL (ref 60–130)

## 2017-10-23 ENCOUNTER — Ambulatory Visit (HOSPITAL_COMMUNITY)
Admission: RE | Admit: 2017-10-23 | Discharge: 2017-10-23 | Disposition: A | Discharge status: Home / Self Care | Payer: Medicare HMO | Source: Ambulatory Visit | Attending: Surgery | Admitting: Surgery

## 2017-10-23 ENCOUNTER — Other Ambulatory Visit: Payer: Self-pay | Admitting: Internal Medicine

## 2017-10-23 DIAGNOSIS — R600 Localized edema: Secondary | ICD-10-CM | POA: Insufficient documentation

## 2017-10-23 DIAGNOSIS — R7989 Other specified abnormal findings of blood chemistry: Secondary | ICD-10-CM | POA: Diagnosis not present

## 2017-10-27 NOTE — Addendum Note (Signed)
Addended by: Aviva Signs M on: 10/27/2017 12:43 PM   Modules accepted: Orders

## 2017-11-01 DIAGNOSIS — H2512 Age-related nuclear cataract, left eye: Secondary | ICD-10-CM | POA: Diagnosis not present

## 2017-11-01 DIAGNOSIS — Z947 Corneal transplant status: Secondary | ICD-10-CM | POA: Diagnosis not present

## 2017-11-01 DIAGNOSIS — R6889 Other general symptoms and signs: Secondary | ICD-10-CM | POA: Diagnosis not present

## 2017-11-01 DIAGNOSIS — H524 Presbyopia: Secondary | ICD-10-CM | POA: Diagnosis not present

## 2017-11-01 DIAGNOSIS — Z961 Presence of intraocular lens: Secondary | ICD-10-CM | POA: Diagnosis not present

## 2017-11-01 DIAGNOSIS — H52203 Unspecified astigmatism, bilateral: Secondary | ICD-10-CM | POA: Diagnosis not present

## 2017-11-01 DIAGNOSIS — H5213 Myopia, bilateral: Secondary | ICD-10-CM | POA: Diagnosis not present

## 2017-11-06 ENCOUNTER — Other Ambulatory Visit: Payer: Self-pay | Admitting: Internal Medicine

## 2017-11-06 DIAGNOSIS — D509 Iron deficiency anemia, unspecified: Secondary | ICD-10-CM

## 2017-11-07 ENCOUNTER — Telehealth: Payer: Self-pay | Admitting: Internal Medicine

## 2017-11-07 ENCOUNTER — Encounter: Payer: Self-pay | Admitting: Internal Medicine

## 2017-11-07 ENCOUNTER — Ambulatory Visit (INDEPENDENT_AMBULATORY_CARE_PROVIDER_SITE_OTHER): Payer: Medicare HMO | Admitting: Internal Medicine

## 2017-11-07 VITALS — BP 98/58 | HR 86 | Temp 98.7°F | Resp 16 | Ht 72.0 in | Wt >= 6400 oz

## 2017-11-07 DIAGNOSIS — R05 Cough: Secondary | ICD-10-CM | POA: Diagnosis not present

## 2017-11-07 DIAGNOSIS — I10 Essential (primary) hypertension: Secondary | ICD-10-CM

## 2017-11-07 DIAGNOSIS — I959 Hypotension, unspecified: Secondary | ICD-10-CM

## 2017-11-07 DIAGNOSIS — R059 Cough, unspecified: Secondary | ICD-10-CM

## 2017-11-07 LAB — HM DIABETES EYE EXAM

## 2017-11-07 LAB — POCT EXHALED NITRIC OXIDE: FENO LEVEL (PPB): 28

## 2017-11-07 NOTE — Progress Notes (Signed)
Subjective:  Patient ID: [name], male    DOB: [birth year]  Age: 62 y.o. MRN: [id number]  CC: Cough   HPI [name] presents for a one-week history of diarrhea, fatigue, cough productive of thick yellow phlegm, shortness of breath, wheezing, lethargy, and weakness.  Outpatient Medications Prior to Visit  Medication Sig Dispense Refill  . albuterol (PROVENTIL HFA;VENTOLIN HFA) 108 (90 Base) MCG/ACT inhaler Inhale 2 puffs into the lungs every 6 (six) hours as needed for wheezing or shortness of breath. 1 Inhaler 1  . aspirin EC 81 MG tablet Take 81 mg by mouth daily.    Marland Kitchen aspirin EC 81 MG tablet     . atorvastatin (LIPITOR) 40 MG tablet TAKE 1 TABLET BY MOUTH DAILY. 90 tablet 3  . bisoprolol (ZEBETA) 10 MG tablet TAKE 1 TABLET BY MOUTH EVERY EVENING. 90 tablet 1  . Cholecalciferol 2000 units TABS Take 1 tablet (2,000 Units total) by mouth daily. 90 tablet 1  . cyanocobalamin 2000 MCG tablet Take 1 tablet (2,000 mcg total) by mouth daily. 90 tablet 1  . cycloSPORINE (RESTASIS) 0.05 % ophthalmic emulsion Place 1 drop into both eyes 2 (two) times daily. 0.4 mL 5  . FEROSUL 325 (65 Fe) MG tablet TAKE 1 TABLET BY MOUTH 2 TIMES DAILY WITH A MEAL. 180 tablet 1  . furosemide (LASIX) 80 MG tablet TAKE 1 TABLET BY MOUTH 2 TIMES DAILY. 60 tablet 3  . glucose blood (ONETOUCH VERIO) test strip USE  STRIP TO CHECK GLUCOSE THREE TIMES DAILY 100 each 5  . HYDROcodone-acetaminophen (NORCO) 10-325 MG tablet Take 1 tablet by mouth 2 (two) times daily. 75 tablet 0  . ibuprofen (ADVIL,MOTRIN) 200 MG tablet Take 200 mg by mouth at bedtime.    . Insulin Detemir (LEVEMIR FLEXTOUCH) 100 UNIT/ML Pen Inject 240 Units into the skin every morning. 75 mL 5  . levothyroxine (SYNTHROID, LEVOTHROID) 50 MCG tablet TAKE 1 TABLET BY MOUTH DAILY BEFORE BREAKFAST. 90 tablet 1  . losartan (COZAAR) 25 MG tablet Take 2 tablets (50 mg total) by mouth daily. 180 tablet 3  . Multiple Vitamins-Minerals (MULTIVITAMIN ADULT  PO) Take by mouth.    Marland Kitchen omeprazole (PRILOSEC) 40 MG capsule Take 1 tablet by mouth 30 min before breakfast. 90 capsule 3  . polyethylene glycol powder (GLYCOLAX/MIRALAX) powder TAKE 17 GRAMS BY MOUTH DAILY AS NEEDED FOR MILD CONSTIPATION. 527 g 11  . potassium chloride SA (K-DUR,KLOR-CON) 20 MEQ tablet TAKE 1 TABLET BY MOUTH DAILY. 30 tablet 3  . SYNJARDY 01-999 MG TABS TAKE 1 TABLET BY MOUTH 2 TIMES DAILY. 60 tablet 3  . thiamine (VITAMIN B-1) 100 MG tablet TAKE 1 TABLET BY MOUTH DAILY. 90 tablet 1  . ULTICARE SHORT PEN NEEDLES 31G X 8 MM MISC USE WITH INSULIN PEN DAILY. 100 each 3  . vitamin B-6 (PYRIDOXINE) 25 MG tablet Take 1 tablet (25 mg total) by mouth daily. 90 tablet 1  . vitamin C (ASCORBIC ACID) 500 MG tablet Take 1,000 mg by mouth daily. Reported on 01/19/2016    . vitamin E 200 UNIT capsule Take 400 Units by mouth daily. Reported on 01/19/2016    . Vitamins A & D (VITAMIN A & D) 10000-400 units TABS     . zinc gluconate 50 MG tablet Take 1 tablet (50 mg total) by mouth daily. Reported on 01/19/2016 90 tablet 1  . zolpidem (AMBIEN) 10 MG tablet Take 1 tablet (10 mg total) by mouth at bedtime as needed for sleep.  30 tablet 0   No facility-administered medications prior to visit.     ROS Review of Systems  Constitutional: Positive for activity change and fatigue. Negative for chills, fever and unexpected weight change.  HENT: Negative.  Negative for facial swelling, sore throat and trouble swallowing.   Respiratory: Positive for cough, shortness of breath and wheezing. Negative for chest tightness.   Cardiovascular: Negative.  Negative for chest pain, palpitations and leg swelling.  Gastrointestinal: Positive for diarrhea. Negative for abdominal pain, constipation, nausea and vomiting.  Endocrine: Negative.   Genitourinary: Negative.   Musculoskeletal: Negative.   Skin: Negative.  Negative for color change.  Neurological: Positive for weakness. Negative for dizziness and  light-headedness.  Hematological: Negative for adenopathy. Does not bruise/bleed easily.  Psychiatric/Behavioral: Negative.     Objective:  BP (!) 98/58 (BP Location: Right Arm, Patient Position: Sitting, Cuff Size: Large)   Pulse 86   Temp 98.7 F (37.1 C) (Oral)   Resp 16   Ht 6' (1.829 m)   Wt (!) 491 lb (222.7 kg)   SpO2 91%   BMI 66.59 kg/m   BP Readings from Last 3 Encounters:  11/07/17 (!) 98/58  10/17/17 130/60  10/03/17 124/70    Wt Readings from Last 3 Encounters:  11/07/17 (!) 491 lb (222.7 kg)  10/17/17 (!) 508 lb 12 oz (230.8 kg)  10/03/17 (!) 504 lb (228.6 kg)    Physical Exam  Constitutional: He is oriented to person, place, and time. He appears lethargic. He is sleeping.  Non-toxic appearance. He has a sickly appearance. He appears ill. He appears distressed.  HENT:  Mouth/Throat: Oropharynx is clear and moist. No oropharyngeal exudate.  Eyes: Conjunctivae are normal. Left eye exhibits no discharge. No scleral icterus.  Neck: Normal range of motion. Neck supple. No JVD present. No thyromegaly present.  Cardiovascular: Normal rate and regular rhythm. Exam reveals no gallop.  No murmur heard. EKG-  Sinus  Rhythm  Low voltage in precordial leads.   -Incomplete left bundle branch block.   ABNORMAL  Pulmonary/Chest: Effort normal. No tachypnea. No respiratory distress. He has no decreased breath sounds. He has wheezes in the left upper field. He has rhonchi in the left upper field. He has no rales. He exhibits no tenderness.  Abdominal: Soft. Bowel sounds are normal. He exhibits no distension and no mass. There is no rebound.  Musculoskeletal: Normal range of motion. He exhibits no edema, tenderness or deformity.  Lymphadenopathy:    He has no cervical adenopathy.  Neurological: He is oriented to person, place, and time. He appears lethargic.  Skin: Skin is warm and dry. No rash noted. He is not diaphoretic. No erythema. No pallor.    Lab Results    Component Value Date   WBC 10.6 (H) 07/05/2017   HGB 10.6 (L) 07/05/2017   HCT 33.3 (L) 07/05/2017   PLT 276.0 07/05/2017   GLUCOSE 176 (H) 10/17/2017   GLUCOSE 176 (H) 10/17/2017   CHOL 133 02/24/2017   TRIG 111 02/24/2017   HDL 27 (L) 02/24/2017   LDLDIRECT 136.8 12/12/2006   LDLCALC 84 02/24/2017   ALT 12 10/17/2017   AST 16 10/17/2017   NA 142 10/17/2017   NA 142 10/17/2017   K 4.3 10/17/2017   K 4.3 10/17/2017   CL 101 10/17/2017   CL 101 10/17/2017   CREATININE 2.56 (H) 10/17/2017   CREATININE 2.56 (H) 10/17/2017   BUN 37 (H) 10/17/2017   BUN 37 (H) 10/17/2017   CO2  32 10/17/2017   CO2 32 10/17/2017   TSH 3.45 10/17/2017   PSA 1.49 10/17/2017   HGBA1C 8.5 (H) 10/17/2017   MICROALBUR <0.7 05/01/2017    Vas [place] Lower Extremity Venous (dvt)  Result Date: 10/25/2017  Lower Venous DVT Study Indication: Chronic left leg Edema. Risk Factors: Cancer History of cancer. Examination Guidelines: A complete evaluation includes B-mode imaging, spectral doppler, color doppler, and power doppler as needed of all accessible portions of each vessel. Bilateral testing is considered an integral part of a complete examination. Limited examinations for reoccurring indications may be performed as noted. The reflux portion of the exam is performed with the patient in reverse Trendelenburg.  Right Venous Findings: +---------+---------------+---------+-----------+----------+-------+          CompressibilityPhasicitySpontaneityPropertiesSummary +---------+---------------+---------+-----------+----------+-------+ CFV      Full                                                 +---------+---------------+---------+-----------+----------+-------+ FV Prox  Full                                                 +---------+---------------+---------+-----------+----------+-------+ FV Mid   Full                                                  +---------+---------------+---------+-----------+----------+-------+ FV DistalFull                                                 +---------+---------------+---------+-----------+----------+-------+ POP      Full                                                 +---------+---------------+---------+-----------+----------+-------+ PTV      Full                                                 +---------+---------------+---------+-----------+----------+-------+ PERO     Full                                                 +---------+---------------+---------+-----------+----------+-------+ GSV      Full                                                 +---------+---------------+---------+-----------+----------+-------+ No evidence of right leg DVT, SVT, or Baker's cyst.  Right Technical Findings: Difficult stundy secondary to body habitus.  Left Venous Findings: +---------+---------------+---------+-----------+----------+-------+  CompressibilityPhasicitySpontaneityPropertiesSummary +---------+---------------+---------+-----------+----------+-------+ CFV      Full                                                 +---------+---------------+---------+-----------+----------+-------+ FV Prox  Full                                                 +---------+---------------+---------+-----------+----------+-------+ FV Mid   Full                                                 +---------+---------------+---------+-----------+----------+-------+ FV DistalFull                                                 +---------+---------------+---------+-----------+----------+-------+ POP      Full                                                 +---------+---------------+---------+-----------+----------+-------+ PTV      Full                                                 +---------+---------------+---------+-----------+----------+-------+ PERO      Full                                                 +---------+---------------+---------+-----------+----------+-------+ GSV      Full                                                 +---------+---------------+---------+-----------+----------+-------+ no evidence of left leg DVT, SVT, or Baker's cyst.  Left Technical Findings: Difficult stundy secondary to body habitus.   Final Interpretation: Right: There is no evidence of deep vein thrombosis in the lower extremity. However, portions of this examination were limited- see technologist comments above. Left: All other veins visualized appear fully compressible and demonstrate appropriate Doppler characteristics. There is no evidence of deep vein thrombosis in the lower extremity. However, portions of this examination were limited- see technologist c  Assessment & Plan:   Deunte was seen today for cough.  Diagnoses and all orders for this visit:  Essential hypertension -     EKG 12-Lead  Cough -     POCT EXHALED NITRIC OXIDE  Hypotension, unspecified hypotension type- He has a myriad of symptoms.  His systolic blood pressure is down to 98.  His EKG is not suspicious for a cardiac event.  I am concerned he is  either dehydrated or septic.  He will be transferred to the ED for urgent evaluation and treatment.   I am having Jac Mraz maintain his vitamin C, vitamin E, cycloSPORINE, polyethylene glycol powder, ULTICARE SHORT PEN NEEDLES, vitamin B-6, zinc gluconate, atorvastatin, omeprazole, losartan, glucose blood, zolpidem, albuterol, aspirin EC, ibuprofen, potassium chloride SA, SYNJARDY, furosemide, thiamine, Multiple Vitamins-Minerals (MULTIVITAMIN ADULT PO), aspirin EC, Vitamin A & D, HYDROcodone-acetaminophen, cyanocobalamin, Cholecalciferol, Insulin Detemir, levothyroxine, bisoprolol, and FEROSUL.  No orders of the defined types were placed in this encounter.    Follow-up: Return if symptoms worsen or fail to  improve.  Scarlette Calico, MD

## 2017-11-07 NOTE — Telephone Encounter (Signed)
Spoke to PCP about the pt statement as typed below. PCP stated that we are not able to do the imaging here due to pt size and stated that pt systolic would not be below 100 due to the liquer, opiate and bp med. PCP stated that he wants EMS called.   EMS has been called. I have requested non emergency and a wellness check due to visit information taken today and PCP order. I did request EMS only.

## 2017-11-07 NOTE — Telephone Encounter (Signed)
[name] called back and said he went to the ED but it was too busy so he left and went home. He wanted to know if Dr.Jones could just call him something in. After speaking with Dr.Jones, he was informed it was very important for him to go to the ED. Patient is refusing to go to the ED. I was advise to call EMS to come get him. Patient is begging for [place] to not call. He does not want a bill. He states he can drive his self and if he did go he would not have a way home. Patient stated he was not going to go. He just wanted to know what is wrong with him. I informed him that is why he needs to go to the ED. He continues to refuse and begs for [place] to not call EMS. I spoke with Dr.Jones CMA, I am letting her take over from here.

## 2017-11-07 NOTE — Telephone Encounter (Signed)
Pt stated that this morning a teaspoon of [employer], hydrocodone and BP med this am. Pt stated that he can not be forced and if I do call the EMS he will call his lawyers on [place]. Pt is refusing to go with EMS if we call them and is getting upset. Pt stated that he is of color and that the cops will come and will feel threatened by him for being a big black man.  I informed pt that PCP has given verbal order to call EMS - I also informed pt that it was his choice if he goes with them or not -  Pt got mad and yelled something inaudible and hung up on me.

## 2017-11-07 NOTE — Patient Instructions (Signed)
Cough, Adult  Coughing is a reflex that clears your throat and your airways. Coughing helps to heal and protect your lungs. It is normal to cough occasionally, but a cough that happens with other symptoms or lasts a long time may be a sign of a condition that needs treatment. A cough may last only 2-3 weeks (acute), or it may last longer than 8 weeks (chronic).  What are the causes?  Coughing is commonly caused by:   Breathing in substances that irritate your lungs.   A viral or bacterial respiratory infection.   Allergies.   Asthma.   Postnasal drip.   Smoking.   Acid backing up from the stomach into the esophagus (gastroesophageal reflux).   Certain medicines.   Chronic lung problems, including COPD (or rarely, lung cancer).   Other medical conditions such as heart failure.    Follow these instructions at home:  Pay attention to any changes in your symptoms. Take these actions to help with your discomfort:   Take medicines only as told by your health care provider.  ? If you were prescribed an antibiotic medicine, take it as told by your health care provider. Do not stop taking the antibiotic even if you start to feel better.  ? Talk with your health care provider before you take a cough suppressant medicine.   Drink enough fluid to keep your urine clear or pale yellow.   If the air is dry, use a cold steam vaporizer or humidifier in your bedroom or your home to help loosen secretions.   Avoid anything that causes you to cough at work or at home.   If your cough is worse at night, try sleeping in a semi-upright position.   Avoid cigarette smoke. If you smoke, quit smoking. If you need help quitting, ask your health care provider.   Avoid caffeine.   Avoid alcohol.   Rest as needed.    Contact a health care provider if:   You have new symptoms.   You cough up pus.   Your cough does not get better after 2-3 weeks, or your cough gets worse.   You cannot control your cough with suppressant  medicines and you are losing sleep.   You develop pain that is getting worse or pain that is not controlled with pain medicines.   You have a fever.   You have unexplained weight loss.   You have night sweats.  Get help right away if:   You cough up blood.   You have difficulty breathing.   Your heartbeat is very fast.  This information is not intended to replace advice given to you by your health care provider. Make sure you discuss any questions you have with your health care provider.  Document Released: 03/04/2011 Document Revised: 02/11/2016 Document Reviewed: 11/12/2014  Elsevier Interactive Patient Education  2018 [employer]

## 2017-11-14 ENCOUNTER — Telehealth: Payer: Self-pay | Admitting: Internal Medicine

## 2017-11-14 NOTE — Telephone Encounter (Signed)
Patient came by the office and dropped off a SCAT Form to be signed by Dr [name]. Patient would like call when it has been completed.  Form placed in Brittany's box.

## 2017-11-15 NOTE — Telephone Encounter (Signed)
The forms are all completed. I did not complete them. I am unsure of who did. All it needs is for the MD to sign. I have placed forms in MD's box for him to review & sign.

## 2017-11-16 NOTE — Telephone Encounter (Signed)
Signed, Copy sent to scan & patient informed the paperwork is ready to be picked up.

## 2017-11-17 ENCOUNTER — Other Ambulatory Visit: Payer: Self-pay | Admitting: Internal Medicine

## 2017-11-17 DIAGNOSIS — I5032 Chronic diastolic (congestive) heart failure: Secondary | ICD-10-CM

## 2017-11-17 DIAGNOSIS — I10 Essential (primary) hypertension: Secondary | ICD-10-CM

## 2017-12-05 ENCOUNTER — Encounter (HOSPITAL_COMMUNITY): Payer: Medicare HMO | Admitting: Cardiology

## 2017-12-05 LAB — HM DIABETES EYE EXAM

## 2017-12-13 ENCOUNTER — Ambulatory Visit: Payer: Medicare HMO | Admitting: Internal Medicine

## 2017-12-26 DIAGNOSIS — R6889 Other general symptoms and signs: Secondary | ICD-10-CM | POA: Diagnosis not present

## 2017-12-27 ENCOUNTER — Other Ambulatory Visit: Payer: Self-pay | Admitting: Cardiology

## 2017-12-27 DIAGNOSIS — I5032 Chronic diastolic (congestive) heart failure: Secondary | ICD-10-CM

## 2017-12-27 DIAGNOSIS — I10 Essential (primary) hypertension: Secondary | ICD-10-CM

## 2018-01-08 ENCOUNTER — Other Ambulatory Visit: Payer: Self-pay | Admitting: Internal Medicine

## 2018-01-29 ENCOUNTER — Ambulatory Visit (INDEPENDENT_AMBULATORY_CARE_PROVIDER_SITE_OTHER): Payer: Medicare HMO | Admitting: Internal Medicine

## 2018-01-29 ENCOUNTER — Encounter: Payer: Self-pay | Admitting: Internal Medicine

## 2018-01-29 ENCOUNTER — Ambulatory Visit (INDEPENDENT_AMBULATORY_CARE_PROVIDER_SITE_OTHER)
Admission: RE | Admit: 2018-01-29 | Discharge: 2018-01-29 | Disposition: A | Discharge status: Home / Self Care | Payer: Medicare HMO | Source: Ambulatory Visit | Attending: Internal Medicine | Admitting: Internal Medicine

## 2018-01-29 VITALS — BP 122/76 | HR 85 | Temp 97.8°F | Ht 72.0 in

## 2018-01-29 DIAGNOSIS — M150 Primary generalized (osteo)arthritis: Secondary | ICD-10-CM | POA: Diagnosis not present

## 2018-01-29 DIAGNOSIS — I10 Essential (primary) hypertension: Secondary | ICD-10-CM | POA: Diagnosis not present

## 2018-01-29 DIAGNOSIS — M159 Polyosteoarthritis, unspecified: Secondary | ICD-10-CM

## 2018-01-29 DIAGNOSIS — M545 Low back pain: Secondary | ICD-10-CM | POA: Diagnosis not present

## 2018-01-29 DIAGNOSIS — M5416 Radiculopathy, lumbar region: Secondary | ICD-10-CM

## 2018-01-29 MED ORDER — HYDROCODONE-ACETAMINOPHEN 10-325 MG PO TABS: 1.0000 | ORAL_TABLET | Freq: Two times a day (BID) | ORAL | 0 refills | Status: DC

## 2018-01-29 MED ORDER — GABAPENTIN 100 MG PO CAPS: 100.0000 mg | ORAL_CAPSULE | Freq: Three times a day (TID) | ORAL | 5 refills | Status: DC

## 2018-01-29 NOTE — Patient Instructions (Signed)
Please take all new medication as prescribed - the gabapentin at 100 mg three times per day  Please continue all other medications as before, and refills have been done if requested - the pain medication  Please have the pharmacy call with any other refills you may need.  Please continue your efforts at being more active, low cholesterol diet, and weight control.  Please keep your appointments with your specialists as you may have planned  You will be contacted regarding the referral for: MRI and orthopedic for the lower back  Please go to the XRAY Department in the Basement (go straight as you get off the elevator) for the x-ray testing  You will be contacted by phone if any changes need to be made immediately.  Otherwise, you will receive a letter about your results with an explanation, but please check with MyChart first.  Please remember to sign up for MyChart if you have not done so, as this will be important to you in the future with finding out test results, communicating by private email, and scheduling acute appointments online when needed.  Please return in 1 week to Dr [name], or sooner if needed

## 2018-01-29 NOTE — Progress Notes (Signed)
Subjective:    Patient ID: [name], male    DOB: Oct 17, 1955, 62 y.o.   MRN: [id number]  HPI  PCP - Dr [name];  Here after fall thur last wk, pain started worse on Saturday, constant, moderate, left low back, radiates to the distal LLE but pain is mostly in the buttocks and thigh, and also dragging the left leg, usually walks with walker due to knee arthritis.  No lower back issue in past, never had xrays or MRI, or PT and no surgury.   Pt denies fever, wt loss, night sweats, loss of appetite, or other constitutional symptoms  Pt denies chest pain, increased sob or doe, wheezing, orthopnea, PND, increased LE swelling, palpitations, dizziness or syncope.   Pt denies polydipsia, polyuria Past Medical History:  Diagnosis Date  . Anemia    iron  . CHF (congestive heart failure) (HCC)    Presumed diastolic. Echo (06/07) w.EF 45%, severe posterior HK, mild LV hypertrophy, No further work-up of abnormal echo was done. pt denies CHF  . Colon cancer ([place]) 2007   s/p sigmoid colectomy  . Diabetes mellitus    Has Hx of diabetic foot ulcer & peripheral neuropathy  type2  . Dyspnea    w/ activity  . History of PFTs 05/2009   Mild Obstructive defect  . HTN (hypertension)   . Hyperlipidemia   . Morbid obesity ([place])   . OSA on CPAP   . Pneumonia    week ago    Past Surgical History:  Procedure Laterality Date  . BARIATRIC SURGERY  12/2009   Lap Band/At [facility]  . COLONOSCOPY  multiple  . COLONOSCOPY WITH PROPOFOL N/A 11/26/2012   Procedure: COLONOSCOPY WITH PROPOFOL;  Surgeon: Gatha Mayer, MD;  Location: [facility];  Service: Endoscopy;  Laterality: N/A;  may need pre appt. with anesthesia due to morbid obesity  . COLONOSCOPY WITH PROPOFOL N/A 07/11/2017   Procedure: COLONOSCOPY WITH PROPOFOL;  Surgeon: Gatha Mayer, MD;  Location: [facility];  Service: Endoscopy;  Laterality: N/A;  . [place]   '80/Right  '84/Left  . EYE SURGERY Right 05/2017  . HERNIA REPAIR     . HIP SURGERY     bilateral  . INCISIONAL HERNIA REPAIR N/A 12/18/2013   Procedure:  REPAIR OF INCARCERATED INCISIONAL HERNIA;  Surgeon: Rolm Bookbinder, MD;  Location: [facility];  Service: General;  Laterality: N/A;  . INSERTION OF MESH N/A 12/18/2013   Procedure: INSERTION OF MESH;  Surgeon: Rolm Bookbinder, MD;  Location: [facility];  Service: General;  Laterality: N/A;  . Sigmoid Colectomy  10/2005   [place]  . TONSILLECTOMY      reports that he has never smoked. He has never used smokeless tobacco. He reports that he does not use drugs. His alcohol history is not on file. family history includes CAD in his mother; Congestive Heart Failure in his mother; Diabetes in his father and mother; Heart attack (age of onset: 75) in his brother; Heart failure in his brother; Hepatitis in his mother. Allergies  Allergen Reactions  . Shellfish Allergy Anaphylaxis  . Lisinopril Cough   Current Outpatient Medications on File Prior to Visit  Medication Sig Dispense Refill  . albuterol (PROVENTIL HFA;VENTOLIN HFA) 108 (90 Base) MCG/ACT inhaler Inhale 2 puffs into the lungs every 6 (six) hours as needed for wheezing or shortness of breath. 1 Inhaler 1  . aspirin EC 81 MG tablet Take 81 mg by mouth daily.    Marland Kitchen  aspirin EC 81 MG tablet     . atorvastatin (LIPITOR) 40 MG tablet TAKE 1 TABLET BY MOUTH DAILY. 90 tablet 3  . bisoprolol (ZEBETA) 10 MG tablet TAKE 1 TABLET BY MOUTH EVERY EVENING. 90 tablet 1  . Cholecalciferol 2000 units TABS Take 1 tablet (2,000 Units total) by mouth daily. 90 tablet 1  . cyanocobalamin 2000 MCG tablet Take 1 tablet (2,000 mcg total) by mouth daily. 90 tablet 1  . cycloSPORINE (RESTASIS) 0.05 % ophthalmic emulsion Place 1 drop into both eyes 2 (two) times daily. 0.4 mL 5  . FEROSUL 325 (65 Fe) MG tablet TAKE 1 TABLET BY MOUTH 2 TIMES DAILY WITH A MEAL. 180 tablet 1  . furosemide (LASIX) 80 MG tablet TAKE 1 TABLET BY MOUTH 2 TIMES DAILY. 60 tablet 3  . glucose blood (ONETOUCH  VERIO) test strip USE  STRIP TO CHECK GLUCOSE THREE TIMES DAILY 100 each 5  . ibuprofen (ADVIL,MOTRIN) 200 MG tablet Take 200 mg by mouth at bedtime.    . Insulin Detemir (LEVEMIR FLEXTOUCH) 100 UNIT/ML Pen Inject 240 Units into the skin every morning. 75 mL 5  . levothyroxine (SYNTHROID, LEVOTHROID) 50 MCG tablet TAKE 1 TABLET BY MOUTH DAILY BEFORE BREAKFAST. 90 tablet 1  . losartan (COZAAR) 25 MG tablet Take 2 tablets (50 mg total) by mouth daily. 180 tablet 3  . Multiple Vitamins-Minerals (MULTIVITAMIN ADULT PO) Take by mouth.    Marland Kitchen omeprazole (PRILOSEC) 40 MG capsule Take 1 tablet by mouth 30 min before breakfast. 90 capsule 3  . polyethylene glycol powder (GLYCOLAX/MIRALAX) powder TAKE 17 GRAMS MIXED IN LIQUID BY MOUTH DAILY AS NEEDED FOR MILD CONSTIPATION. 527 g 5  . potassium chloride SA (K-DUR,KLOR-CON) 20 MEQ tablet TAKE 1 TABLET BY MOUTH DAILY. 30 tablet 3  . SYNJARDY 01-999 MG TABS TAKE 1 TABLET BY MOUTH 2 TIMES DAILY. 60 tablet 3  . thiamine (VITAMIN B-1) 100 MG tablet TAKE 1 TABLET BY MOUTH DAILY. 90 tablet 1  . ULTICARE SHORT PEN NEEDLES 31G X 8 MM MISC USE WITH INSULIN PEN DAILY. 100 each 3  . VENTOLIN HFA 108 (90 Base) MCG/ACT inhaler INHALE 2 PUFFS INTO THE LUNGS EVERY 6 HOURS AS NEEDED FOR WHEEZING OR SHORTNESS OF BREATH. 18 g 1  . vitamin B-6 (PYRIDOXINE) 25 MG tablet Take 1 tablet (25 mg total) by mouth daily. 90 tablet 1  . vitamin C (ASCORBIC ACID) 500 MG tablet Take 1,000 mg by mouth daily. Reported on 01/19/2016    . vitamin E 200 UNIT capsule Take 400 Units by mouth daily. Reported on 01/19/2016    . Vitamins A & D (VITAMIN A & D) 10000-400 units TABS     . zinc gluconate 50 MG tablet Take 1 tablet (50 mg total) by mouth daily. Reported on 01/19/2016 90 tablet 1  . zolpidem (AMBIEN) 10 MG tablet Take 1 tablet (10 mg total) by mouth at bedtime as needed for sleep. 30 tablet 0   No current facility-administered medications on file prior to visit.    Review of Systems   Constitutional: Negative for other unusual diaphoresis or sweats HENT: Negative for ear discharge or swelling Eyes: Negative for other worsening visual disturbances Respiratory: Negative for stridor or other swelling  Gastrointestinal: Negative for worsening distension or other blood Genitourinary: Negative for retention or other urinary change Musculoskeletal: Negative for other MSK pain or swelling Skin: Negative for color change or other new lesions Neurological: Negative for worsening tremors and other numbness  Psychiatric/Behavioral:  Negative for worsening agitation or other fatigue All other system neg per pt    Objective:   Physical Exam BP 122/76   Pulse 85   Temp 97.8 F (36.6 C) (Oral)   Ht 6' (1.829 m)   SpO2 92%   BMI 66.59 kg/m  VS noted,  Constitutional: Pt appears in NAD HENT: Head: NCAT.  Right Ear: External ear normal.  Left Ear: External ear normal.  Eyes: . Pupils are equal, round, and reactive to light. Conjunctivae and EOM are normal Nose: without d/c or deformity Neck: Neck supple. Gross normal ROM Cardiovascular: Normal rate and regular rhythm.   Pulmonary/Chest: Effort normal and breath sounds without rales or wheezing.  Abd:  Soft, NT, ND, + BS, no organomegaly Spine tender left paravertebral and left upper buttock area Neurological: Pt is alert. At baseline orientation, motor 5/5 intact except 4+/5 LLE weakness Skin: Skin is warm. No rashes, other new lesions, no LE edema Psychiatric: Pt behavior is normal without agitation  No other exam findings Lab Results  Component Value Date   WBC 10.6 (H) 07/05/2017   HGB 10.6 (L) 07/05/2017   HCT 33.3 (L) 07/05/2017   PLT 276.0 07/05/2017   GLUCOSE 176 (H) 10/17/2017   GLUCOSE 176 (H) 10/17/2017   CHOL 133 02/24/2017   TRIG 111 02/24/2017   HDL 27 (L) 02/24/2017   LDLDIRECT 136.8 12/12/2006   LDLCALC 84 02/24/2017   ALT 12 10/17/2017   AST 16 10/17/2017   NA 142 10/17/2017   NA 142 10/17/2017    K 4.3 10/17/2017   K 4.3 10/17/2017   CL 101 10/17/2017   CL 101 10/17/2017   CREATININE 2.56 (H) 10/17/2017   CREATININE 2.56 (H) 10/17/2017   BUN 37 (H) 10/17/2017   BUN 37 (H) 10/17/2017   CO2 32 10/17/2017   CO2 32 10/17/2017   TSH 3.45 10/17/2017   PSA 1.49 10/17/2017   HGBA1C 8.5 (H) 10/17/2017   MICROALBUR <0.7 05/01/2017      Assessment & Plan:

## 2018-01-30 NOTE — Assessment & Plan Note (Signed)
Recent onset after fall in a supermorbid obese pt, has neuro change on exam, for pain control with gabapentin, MRI LS spine, ortho referral, and plain films today due to fall and r/o fx

## 2018-01-30 NOTE — Assessment & Plan Note (Signed)
For pain med refill as courtesy, further refills per PCP

## 2018-01-30 NOTE — Assessment & Plan Note (Signed)
stable overall by history and exam, recent data reviewed with pt, and pt to continue medical treatment as before,  to f/u any worsening symptoms or concerns BP Readings from Last 3 Encounters:  01/29/18 122/76  11/07/17 (!) 98/58  10/17/17 130/60

## 2018-02-02 ENCOUNTER — Other Ambulatory Visit: Payer: Self-pay | Admitting: Internal Medicine

## 2018-02-02 DIAGNOSIS — E118 Type 2 diabetes mellitus with unspecified complications: Secondary | ICD-10-CM

## 2018-02-03 ENCOUNTER — Other Ambulatory Visit: Payer: Self-pay | Admitting: Internal Medicine

## 2018-02-03 DIAGNOSIS — E1022 Type 1 diabetes mellitus with diabetic chronic kidney disease: Secondary | ICD-10-CM

## 2018-02-05 ENCOUNTER — Other Ambulatory Visit: Payer: Self-pay | Admitting: Internal Medicine

## 2018-02-06 ENCOUNTER — Encounter: Payer: Self-pay | Admitting: Internal Medicine

## 2018-02-06 ENCOUNTER — Ambulatory Visit (INDEPENDENT_AMBULATORY_CARE_PROVIDER_SITE_OTHER): Payer: Medicare HMO | Admitting: Internal Medicine

## 2018-02-06 ENCOUNTER — Telehealth: Payer: Self-pay | Admitting: Internal Medicine

## 2018-02-06 VITALS — BP 130/80 | HR 87 | Temp 98.0°F | Resp 16 | Ht 72.0 in | Wt >= 6400 oz

## 2018-02-06 DIAGNOSIS — M5416 Radiculopathy, lumbar region: Secondary | ICD-10-CM | POA: Diagnosis not present

## 2018-02-06 DIAGNOSIS — E785 Hyperlipidemia, unspecified: Secondary | ICD-10-CM

## 2018-02-06 DIAGNOSIS — I10 Essential (primary) hypertension: Secondary | ICD-10-CM | POA: Diagnosis not present

## 2018-02-06 DIAGNOSIS — D508 Other iron deficiency anemias: Secondary | ICD-10-CM | POA: Diagnosis not present

## 2018-02-06 DIAGNOSIS — E039 Hypothyroidism, unspecified: Secondary | ICD-10-CM | POA: Diagnosis not present

## 2018-02-06 DIAGNOSIS — E118 Type 2 diabetes mellitus with unspecified complications: Secondary | ICD-10-CM

## 2018-02-06 DIAGNOSIS — E669 Obesity, unspecified: Secondary | ICD-10-CM | POA: Diagnosis not present

## 2018-02-06 DIAGNOSIS — D539 Nutritional anemia, unspecified: Secondary | ICD-10-CM | POA: Diagnosis not present

## 2018-02-06 DIAGNOSIS — E538 Deficiency of other specified B group vitamins: Secondary | ICD-10-CM | POA: Diagnosis not present

## 2018-02-06 DIAGNOSIS — E559 Vitamin D deficiency, unspecified: Secondary | ICD-10-CM | POA: Diagnosis not present

## 2018-02-06 DIAGNOSIS — E519 Thiamine deficiency, unspecified: Secondary | ICD-10-CM | POA: Diagnosis not present

## 2018-02-06 DIAGNOSIS — R300 Dysuria: Secondary | ICD-10-CM | POA: Diagnosis not present

## 2018-02-06 NOTE — Telephone Encounter (Signed)
Did pt discuss increasing Hydrocodone dose?

## 2018-02-06 NOTE — Telephone Encounter (Signed)
Copied from [place] [id number]. Topic: Quick Communication - See Telephone Encounter >> Feb 06, 2018  3:33 PM Clack, Laban Emperor wrote: CRM for notification. See Telephone encounter for: 02/06/18.  Pt would like to know if he can get an increase on HYDROcodone-acetaminophen (NORCO) 10-325 MG tablet [[id number]]. States the dosage he is on now is not doing anything.  [place], [place] - [place] [phone] (Phone) [phone] (Fax)

## 2018-02-06 NOTE — Progress Notes (Signed)
Subjective:  Patient ID: [name], male    DOB: [birth year]  Age: 62 y.o. MRN: [id number]  CC: Anemia; Back Pain; Hypertension; Diabetes; and Hypothyroidism   HPI [name] presents for f/up - He recently fell and injured his lower back.  He was seen by another provider and told that his low back x-rays were normal.  He is taking Norco to control the pain.  He complains of chronic numbness and tingling in the left lower extremity more so than the right lower extremity.  The back pain does not radiate into his lower extremities.  He also complains of chronic shortness of breath and some recent weight gain.  He wants to see if he is a candidate for bariatric surgery again.  Outpatient Medications Prior to Visit  Medication Sig Dispense Refill  . albuterol (PROVENTIL HFA;VENTOLIN HFA) 108 (90 Base) MCG/ACT inhaler Inhale 2 puffs into the lungs every 6 (six) hours as needed for wheezing or shortness of breath. 1 Inhaler 1  . aspirin EC 81 MG tablet Take 81 mg by mouth daily.    Marland Kitchen aspirin EC 81 MG tablet     . atorvastatin (LIPITOR) 40 MG tablet TAKE 1 TABLET BY MOUTH DAILY. 90 tablet 1  . bisoprolol (ZEBETA) 10 MG tablet TAKE 1 TABLET BY MOUTH EVERY EVENING. 90 tablet 1  . Cholecalciferol 2000 units TABS Take 1 tablet (2,000 Units total) by mouth daily. 90 tablet 1  . cyanocobalamin 2000 MCG tablet Take 1 tablet (2,000 mcg total) by mouth daily. 90 tablet 1  . cycloSPORINE (RESTASIS) 0.05 % ophthalmic emulsion Place 1 drop into both eyes 2 (two) times daily. 0.4 mL 5  . Empagliflozin-metFORMIN HCl (SYNJARDY) 01-999 MG TABS Take 1 tablet by mouth 2 (two) times daily. 180 tablet 1  . FEROSUL 325 (65 Fe) MG tablet TAKE 1 TABLET BY MOUTH 2 TIMES DAILY WITH A MEAL. 180 tablet 1  . furosemide (LASIX) 80 MG tablet TAKE 1 TABLET BY MOUTH 2 TIMES DAILY. 60 tablet 3  . gabapentin (NEURONTIN) 100 MG capsule Take 1 capsule (100 mg total) by mouth 3 (three) times daily. 90 capsule 5  . glucose blood  (ONETOUCH VERIO) test strip USE  STRIP TO CHECK GLUCOSE THREE TIMES DAILY 100 each 5  . HYDROcodone-acetaminophen (NORCO) 10-325 MG tablet Take 1 tablet by mouth 2 (two) times daily. 75 tablet 0  . Insulin Detemir (LEVEMIR FLEXTOUCH) 100 UNIT/ML Pen Inject 240 Units into the skin every morning. 75 mL 5  . levothyroxine (SYNTHROID, LEVOTHROID) 50 MCG tablet TAKE 1 TABLET BY MOUTH DAILY BEFORE BREAKFAST. 90 tablet 1  . losartan (COZAAR) 25 MG tablet Take 2 tablets (50 mg total) by mouth daily. 180 tablet 3  . Multiple Vitamins-Minerals (MULTIVITAMIN ADULT PO) Take by mouth.    Marland Kitchen omeprazole (PRILOSEC) 40 MG capsule Take 1 tablet by mouth 30 min before breakfast. 90 capsule 3  . polyethylene glycol powder (GLYCOLAX/MIRALAX) powder TAKE 17 GRAMS MIXED IN LIQUID BY MOUTH DAILY AS NEEDED FOR MILD CONSTIPATION. 527 g 5  . potassium chloride SA (K-DUR,KLOR-CON) 20 MEQ tablet TAKE 1 TABLET BY MOUTH DAILY. 30 tablet 3  . thiamine (VITAMIN B-1) 100 MG tablet TAKE 1 TABLET BY MOUTH DAILY. 90 tablet 1  . ULTICARE SHORT PEN NEEDLES 31G X 8 MM MISC USE WITH INSULIN PENS DAILY. 100 each 3  . VENTOLIN HFA 108 (90 Base) MCG/ACT inhaler INHALE 2 PUFFS INTO THE LUNGS EVERY 6 HOURS AS NEEDED FOR WHEEZING OR SHORTNESS  OF BREATH. 18 g 1  . vitamin B-6 (PYRIDOXINE) 25 MG tablet Take 1 tablet (25 mg total) by mouth daily. 90 tablet 1  . vitamin C (ASCORBIC ACID) 500 MG tablet Take 1,000 mg by mouth daily. Reported on 01/19/2016    . vitamin E 200 UNIT capsule Take 400 Units by mouth daily. Reported on 01/19/2016    . Vitamins A & D (VITAMIN A & D) 10000-400 units TABS     . zinc gluconate 50 MG tablet Take 1 tablet (50 mg total) by mouth daily. Reported on 01/19/2016 90 tablet 1  . ibuprofen (ADVIL,MOTRIN) 200 MG tablet Take 200 mg by mouth at bedtime.    Marland Kitchen zolpidem (AMBIEN) 10 MG tablet Take 1 tablet (10 mg total) by mouth at bedtime as needed for sleep. 30 tablet 0   No facility-administered medications prior to visit.      ROS Review of Systems  Constitutional: Positive for unexpected weight change (wt gain). Negative for chills, diaphoresis and fatigue.  HENT: Negative.   Eyes: Negative for visual disturbance.  Respiratory: Positive for apnea and shortness of breath. Negative for cough, chest tightness and wheezing.   Cardiovascular: Negative for chest pain, palpitations and leg swelling.  Gastrointestinal: Negative for abdominal pain, constipation, diarrhea, nausea and vomiting.  Endocrine: Negative.  Negative for cold intolerance, heat intolerance, polydipsia, polyphagia and polyuria.  Genitourinary: Negative.  Negative for difficulty urinating.  Musculoskeletal: Positive for arthralgias and back pain. Negative for myalgias.  Skin: Negative.   Neurological: Positive for numbness. Negative for dizziness, weakness and light-headedness.       + N/T in left LLE, chronic  Hematological: Negative for adenopathy. Does not bruise/bleed easily.  Psychiatric/Behavioral: Negative.     Objective:  BP 130/80 (BP Location: Left Arm, Patient Position: Sitting, Cuff Size: Large)   Pulse 87   Temp 98 F (36.7 C) (Oral)   Resp 16   Ht 6' (1.829 m)   Wt (!) 497 lb (225.4 kg)   SpO2 93%   BMI 67.41 kg/m   BP Readings from Last 3 Encounters:  02/06/18 130/80  01/29/18 122/76  11/07/17 (!) 98/58    Wt Readings from Last 3 Encounters:  02/06/18 (!) 497 lb (225.4 kg)  11/07/17 (!) 491 lb (222.7 kg)  10/17/17 (!) 508 lb 12 oz (230.8 kg)    Physical Exam  Constitutional: He is oriented to person, place, and time. No distress.  HENT:  Mouth/Throat: Oropharynx is clear and moist. No oropharyngeal exudate.  Eyes: Conjunctivae are normal. No scleral icterus.  Neck: Normal range of motion. Neck supple. No JVD present. No thyromegaly present.  Cardiovascular: Normal rate, regular rhythm and normal heart sounds. Exam reveals no gallop.  No murmur heard. Pulmonary/Chest: Effort normal and breath sounds normal.  He has no wheezes. He has no rales.  Abdominal: Soft. Normal appearance and bowel sounds are normal. He exhibits no mass. There is no tenderness.  Musculoskeletal: Normal range of motion. He exhibits no edema, tenderness or deformity.       Lumbar back: Normal. He exhibits normal range of motion, no tenderness, no bony tenderness, no edema and no pain.  Lymphadenopathy:    He has no cervical adenopathy.  Neurological: He is alert and oriented to person, place, and time. He has normal reflexes.  Neg SLR in BLE  Skin: Skin is warm and dry. He is not diaphoretic.  Vitals reviewed.   Lab Results  Component Value Date   WBC 10.6 (H) 07/05/2017  HGB 10.6 (L) 07/05/2017   HCT 33.3 (L) 07/05/2017   PLT 276.0 07/05/2017   GLUCOSE 176 (H) 10/17/2017   GLUCOSE 176 (H) 10/17/2017   CHOL 133 02/24/2017   TRIG 111 02/24/2017   HDL 27 (L) 02/24/2017   LDLDIRECT 136.8 12/12/2006   LDLCALC 84 02/24/2017   ALT 12 10/17/2017   AST 16 10/17/2017   NA 142 10/17/2017   NA 142 10/17/2017   K 4.3 10/17/2017   K 4.3 10/17/2017   CL 101 10/17/2017   CL 101 10/17/2017   CREATININE 2.56 (H) 10/17/2017   CREATININE 2.56 (H) 10/17/2017   BUN 37 (H) 10/17/2017   BUN 37 (H) 10/17/2017   CO2 32 10/17/2017   CO2 32 10/17/2017   TSH 3.45 10/17/2017   PSA 1.49 10/17/2017   HGBA1C 8.5 (H) 10/17/2017   MICROALBUR <0.7 05/01/2017    Dg Lumbar Spine Complete  Result Date: 01/29/2018 CLINICAL DATA:  Five days of low back pain with gradually increasing severity. EXAM: LUMBAR SPINE - COMPLETE 4+ VIEW COMPARISON:  Coronal and sagittal CT reconstructed images from an abdominal and pelvic CT scan of March 03, 2014 FINDINGS: The lumbar vertebral bodies are preserved in height. The pedicles are grossly intact. There is no spondylolisthesis. There is mild disc space narrowing at L4-5 which is stable. There is facet joint hypertrophy at L4-5 and L5-S1. IMPRESSION: Mild degenerative disc space narrowing at L4-5. No  spondylolisthesis or compression fracture. Mild degenerative facet joint hypertrophy at L4-5 and at L5-S1. Electronically Signed   By: David  Martinique M.D.   On: 01/29/2018 15:53    Assessment & Plan:   Shervin was seen today for anemia, back pain, hypertension, diabetes and hypothyroidism.  Diagnoses and all orders for this visit:  Type 2 diabetes mellitus with complication, without long-term current use of insulin ([place])- I will monitor his A1c and will advise accordingly. -     Hemoglobin A1c; Future  Acquired hypothyroidism- I will recheck his TSH and will adjust his dose if indicated. -     TSH; Future  Essential hypertension- His blood pressure is well controlled.  I will monitor his electrolytes and renal function. -     Comprehensive metabolic panel; Future  Deficiency anemia- I will recheck his H&H and screen for vitamin deficiencies. -     CBC with Differential/Platelet; Future -     Vitamin B12; Future -     Ferritin; Future  B12 deficiency -     CBC with Differential/Platelet; Future -     Vitamin B12; Future -     Folate; Future  Iron deficiency anemia secondary to inadequate dietary iron intake -     CBC with Differential/Platelet; Future -     IBC panel; Future -     Ferritin; Future  Thiamine deficiency -     CBC with Differential/Platelet; Future -     Vitamin B1; Future  Vitamin D deficiency -     VITAMIN D 25 Hydroxy (Vit-D Deficiency, Fractures); Future  Hyperlipidemia with target LDL less than 100 -     Lipid panel; Future  Super obesity -     Ambulatory referral to General Surgery  Left lumbar radiculopathy- He is neurologically intact and getting symptom relief with Norco as needed.  Will continue.   I have discontinued Demarea Klinkner's ibuprofen. I am also having him maintain his vitamin C, vitamin E, cycloSPORINE, vitamin B-6, zinc gluconate, omeprazole, losartan, glucose blood, zolpidem, albuterol, aspirin EC, potassium chloride SA,  thiamine,  Multiple Vitamins-Minerals (MULTIVITAMIN ADULT PO), aspirin EC, Vitamin A & D, cyanocobalamin, Cholecalciferol, Insulin Detemir, levothyroxine, bisoprolol, FEROSUL, polyethylene glycol powder, furosemide, VENTOLIN HFA, HYDROcodone-acetaminophen, gabapentin, Empagliflozin-metFORMIN HCl, ULTICARE SHORT PEN NEEDLES, and atorvastatin.  No orders of the defined types were placed in this encounter.    Follow-up: Return in about 6 months (around 08/09/2018).  Scarlette Calico, MD

## 2018-02-06 NOTE — Patient Instructions (Signed)

## 2018-02-08 ENCOUNTER — Ambulatory Visit (HOSPITAL_COMMUNITY)
Admission: RE | Admit: 2018-02-08 | Discharge: 2018-02-08 | Disposition: A | Discharge status: Home / Self Care | Payer: Medicare HMO | Source: Ambulatory Visit | Attending: Cardiology | Admitting: Cardiology

## 2018-02-08 ENCOUNTER — Other Ambulatory Visit: Payer: Self-pay

## 2018-02-08 ENCOUNTER — Encounter (HOSPITAL_COMMUNITY): Payer: Self-pay | Admitting: Cardiology

## 2018-02-08 VITALS — BP 120/52 | HR 71 | Wt >= 6400 oz

## 2018-02-08 DIAGNOSIS — Z7982 Long term (current) use of aspirin: Secondary | ICD-10-CM | POA: Diagnosis not present

## 2018-02-08 DIAGNOSIS — E669 Obesity, unspecified: Secondary | ICD-10-CM

## 2018-02-08 DIAGNOSIS — I130 Hypertensive heart and chronic kidney disease with heart failure and stage 1 through stage 4 chronic kidney disease, or unspecified chronic kidney disease: Secondary | ICD-10-CM | POA: Diagnosis not present

## 2018-02-08 DIAGNOSIS — E1122 Type 2 diabetes mellitus with diabetic chronic kidney disease: Secondary | ICD-10-CM | POA: Diagnosis not present

## 2018-02-08 DIAGNOSIS — Z85038 Personal history of other malignant neoplasm of large intestine: Secondary | ICD-10-CM | POA: Insufficient documentation

## 2018-02-08 DIAGNOSIS — Z9884 Bariatric surgery status: Secondary | ICD-10-CM | POA: Diagnosis not present

## 2018-02-08 DIAGNOSIS — N183 Chronic kidney disease, stage 3 (moderate): Secondary | ICD-10-CM | POA: Insufficient documentation

## 2018-02-08 DIAGNOSIS — Z794 Long term (current) use of insulin: Secondary | ICD-10-CM | POA: Diagnosis not present

## 2018-02-08 DIAGNOSIS — G4733 Obstructive sleep apnea (adult) (pediatric): Secondary | ICD-10-CM | POA: Insufficient documentation

## 2018-02-08 DIAGNOSIS — I10 Essential (primary) hypertension: Secondary | ICD-10-CM | POA: Diagnosis not present

## 2018-02-08 DIAGNOSIS — Z79899 Other long term (current) drug therapy: Secondary | ICD-10-CM | POA: Insufficient documentation

## 2018-02-08 DIAGNOSIS — I5032 Chronic diastolic (congestive) heart failure: Secondary | ICD-10-CM | POA: Diagnosis not present

## 2018-02-08 DIAGNOSIS — R6889 Other general symptoms and signs: Secondary | ICD-10-CM | POA: Diagnosis not present

## 2018-02-08 DIAGNOSIS — R0602 Shortness of breath: Secondary | ICD-10-CM | POA: Diagnosis not present

## 2018-02-08 DIAGNOSIS — E785 Hyperlipidemia, unspecified: Secondary | ICD-10-CM | POA: Diagnosis not present

## 2018-02-08 DIAGNOSIS — Z8249 Family history of ischemic heart disease and other diseases of the circulatory system: Secondary | ICD-10-CM | POA: Insufficient documentation

## 2018-02-08 LAB — BASIC METABOLIC PANEL
ANION GAP: 8 (ref 5–15)
BUN: 26 mg/dL — ABNORMAL HIGH (ref 6–20)
CALCIUM: 8.8 mg/dL — AB (ref 8.9–10.3)
CO2: 27 mmol/L (ref 22–32)
CREATININE: 2.05 mg/dL — AB (ref 0.61–1.24)
Chloride: 105 mmol/L (ref 101–111)
GFR calc Af Amer: 39 mL/min — ABNORMAL LOW (ref >60)
GFR, EST NON AFRICAN AMERICAN: 34 mL/min — AB (ref >60)
Glucose, Bld: 134 mg/dL — ABNORMAL HIGH (ref 65–99)
Potassium: 3.9 mmol/L (ref 3.5–5.1)
SODIUM: 140 mmol/L (ref 135–145)

## 2018-02-08 NOTE — Patient Instructions (Signed)
Labs drawn today (if we do not call you, then your lab work was stable)   Your physician has requested that you have an echocardiogram. Echocardiography is a painless test that uses sound waves to create images of your heart. It provides your doctor with information about the size and shape of your heart and how well your heart's chambers and valves are working. This procedure takes approximately one hour. There are no restrictions for this procedure. (they will call you)   You have been referred to Dr. Leafy Ro for Weight Loss    Your physician recommends that you schedule a follow-up appointment in: 6 months with Dr. Aundra Dubin  Please Call an Schedule Appointment (Call In August)

## 2018-02-08 NOTE — Telephone Encounter (Signed)
Contacted pt and informed that I am waiting on a response. Pt stated that he did not discuss increasing his pain medication and that he was coming in tomorrow morning to do his blood work.

## 2018-02-08 NOTE — Progress Notes (Signed)
Patient ID: [name], male   DOB: [birth year], 62 y.o.   MRN: [id number] PCP: Dr. [name] Cardiology: Dr. Aundra Dubin  62 y.o. with history of morbid obesity, diabetes, diastolic CHF presents for followup of CHF.   He still has limited mobility due to size and uses a rolling walker in the house and a power chair when he goes out. He is mildly short of breath walking around the house. No orthopnea.  He uses CPAP. No chest pain.  No lightheadedness. Weight is up 25 lbs since last appointment. He knows he needs to exercise more and has started swimming.   Labs (8/10): creatinine 1.1, LDL 94, HDL 34 Labs (8/11): K 4.8, creatinine 1.2, LDL 93, HDL 33 Labs (10/12): K 5.4, creatinine 1.5, LDL 95, HDL 42 Labs (8/14): LDL 115, HDL 42 Labs (9/14): BNP 68 Labs (10/14): K 4.5, creatinine 1.6 Labs (7/15): K 4.4, creatinine 1.3 Labs (8/15): LDL 136, HDL 37 Labs (11/17): K 4.6, creatinine 1.43, LDL 111, HDL 37 Labs (4/18): K 4.9, creatinine 1.35, hgb 10.5 Labs (1/19): creatinine 2.56  ECG (2/19): NSR, iLBBB (personally reviewed)  Allergies (verified):  1)  ! * Shellfish 2)  * Shellfish  Past Medical History: 1. Diabetes mellitus.  Has history of diabetic foot ulcer and diabetic peripheral neuropathy. .  2. Hyperlipidemia 3. Hypertension: ACEI cough.  4. Colon cancer: s/p sigmoid colectomy in 2007 5. Morbid obesity s/p lap band procedure.  6. Anemia 7. Obstructive Sleep Apnea: Uses CPAP 8. CHF: Diastolic.  Echo (5/12): EF 50-55%, mild LV dilation, mild LVH, moderate (grade II) diastolic dysfunction.  Echo (9/14) with EF 50-55%, mild LVH, moderately dilated LV, RV poorly visualized.  9. PFTs (9/10): mild obstructive defect 10.  Dobutamine stress echo (9/10): EF 55%, mild LV hypertrophy, no significant valvular disease, no ischemia or infarction  11. CKD stage 3 12. Ventral hernia s/p repair.   Family History: father- deceased @68 : Asbestosis mother - deceased @ 29: CHF, CAD, Hepatitis C Neg- colon  or prostate cancer. Brother - died of heart failure and had MI at 18  Social History: [employer], 2 years of college Native of [place]. 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 Never Smoked Alcohol use-occasional.   Drug use-no Regular exercise-no  ROS: All systems reviewed and negative except as per HPI.    Current Outpatient Medications  Medication Sig Dispense Refill  . albuterol (PROVENTIL HFA;VENTOLIN HFA) 108 (90 Base) MCG/ACT inhaler Inhale 2 puffs into the lungs every 6 (six) hours as needed for wheezing or shortness of breath. 1 Inhaler 1  . aspirin EC 81 MG tablet Take 81 mg by mouth daily.    Marland Kitchen aspirin EC 81 MG tablet     . atorvastatin (LIPITOR) 40 MG tablet TAKE 1 TABLET BY MOUTH DAILY. 90 tablet 1  . bisoprolol (ZEBETA) 10 MG tablet TAKE 1 TABLET BY MOUTH EVERY EVENING. 90 tablet 1  . Cholecalciferol 2000 units TABS Take 1 tablet (2,000 Units total) by mouth daily. 90 tablet 1  . cyanocobalamin 2000 MCG tablet Take 1 tablet (2,000 mcg total) by mouth daily. 90 tablet 1  . cycloSPORINE (RESTASIS) 0.05 % ophthalmic emulsion Place 1 drop into both eyes 2 (two) times daily. 0.4 mL 5  . Empagliflozin-metFORMIN HCl (SYNJARDY) 01-999 MG TABS Take 1 tablet by mouth 2 (two) times daily. 180 tablet 1  . FEROSUL 325 (65 Fe) MG tablet TAKE 1 TABLET BY MOUTH 2 TIMES DAILY WITH A MEAL.  180 tablet 1  . furosemide (LASIX) 80 MG tablet TAKE 1 TABLET BY MOUTH 2 TIMES DAILY. 60 tablet 3  . gabapentin (NEURONTIN) 100 MG capsule Take 1 capsule (100 mg total) by mouth 3 (three) times daily. 90 capsule 5  . glucose blood (ONETOUCH VERIO) test strip USE  STRIP TO CHECK GLUCOSE THREE TIMES DAILY 100 each 5  . HYDROcodone-acetaminophen (NORCO) 10-325 MG tablet Take 1 tablet by mouth 2 (two) times daily. 75 tablet 0  . Insulin Detemir (LEVEMIR FLEXTOUCH) 100 UNIT/ML Pen Inject 240 Units into the skin every morning. 75 mL 5  . levothyroxine  (SYNTHROID, LEVOTHROID) 50 MCG tablet TAKE 1 TABLET BY MOUTH DAILY BEFORE BREAKFAST. 90 tablet 1  . losartan (COZAAR) 25 MG tablet Take 2 tablets (50 mg total) by mouth daily. 180 tablet 3  . Multiple Vitamins-Minerals (MULTIVITAMIN ADULT PO) Take by mouth.    Marland Kitchen omeprazole (PRILOSEC) 40 MG capsule Take 1 tablet by mouth 30 min before breakfast. 90 capsule 3  . polyethylene glycol powder (GLYCOLAX/MIRALAX) powder TAKE 17 GRAMS MIXED IN LIQUID BY MOUTH DAILY AS NEEDED FOR MILD CONSTIPATION. 527 g 5  . potassium chloride SA (K-DUR,KLOR-CON) 20 MEQ tablet TAKE 1 TABLET BY MOUTH DAILY. 30 tablet 3  . thiamine (VITAMIN B-1) 100 MG tablet TAKE 1 TABLET BY MOUTH DAILY. 90 tablet 1  . ULTICARE SHORT PEN NEEDLES 31G X 8 MM MISC USE WITH INSULIN PENS DAILY. 100 each 3  . VENTOLIN HFA 108 (90 Base) MCG/ACT inhaler INHALE 2 PUFFS INTO THE LUNGS EVERY 6 HOURS AS NEEDED FOR WHEEZING OR SHORTNESS OF BREATH. 18 g 1  . vitamin B-6 (PYRIDOXINE) 25 MG tablet Take 1 tablet (25 mg total) by mouth daily. 90 tablet 1  . vitamin C (ASCORBIC ACID) 500 MG tablet Take 1,000 mg by mouth daily. Reported on 01/19/2016    . vitamin E 200 UNIT capsule Take 400 Units by mouth daily. Reported on 01/19/2016    . Vitamins A & D (VITAMIN A & D) 10000-400 units TABS     . zinc gluconate 50 MG tablet Take 1 tablet (50 mg total) by mouth daily. Reported on 01/19/2016 90 tablet 1  . zolpidem (AMBIEN) 10 MG tablet Take 1 tablet (10 mg total) by mouth at bedtime as needed for sleep. 30 tablet 0   No current facility-administered medications for this encounter.     BP (!) 120/52   Pulse 71   Wt (!) 477 lb (216.4 kg)   SpO2 97%   BMI 64.69 kg/m  General: NAD, morbidly obese.  Neck: Thick, no JVD, no thyromegaly or thyroid nodule.  Lungs: Clear to auscultation bilaterally with normal respiratory effort. CV: Nondisplaced PMI.  Heart regular S1/S2, no S3/S4, no murmur.  1+ ankle edema.  No carotid bruit.  Normal pedal pulses.  Abdomen: Soft,  nontender, no hepatosplenomegaly, no distention.  Skin: Intact without lesions or rashes.  Neurologic: Alert and oriented x 3.  Psych: Normal affect. Extremities: No clubbing or cyanosis.  HEENT: Normal.   Assessment/Plan: 1. Chronic diastolic CHF: He does not look volume overloaded on exam (though exam is difficult).  NYHA class III symptoms but suspect this is more due to morbid obesity and orthopedic issues.  Weight is up.  - Continue current Lasix, check BMET today.    - Repeat echo to reassess LV function.  2. Obesity: Weight loss is imperative but will be difficult given his limited mobility.  He has history of prior lap banding.  -  I am going to refer him to the weight management clinic with Dr. Leafy Ro.  3. HTN: BP controlled.   Followup in 6 months.    [name] 02/08/2018

## 2018-02-13 ENCOUNTER — Other Ambulatory Visit (INDEPENDENT_AMBULATORY_CARE_PROVIDER_SITE_OTHER): Payer: Medicare HMO

## 2018-02-13 ENCOUNTER — Other Ambulatory Visit: Payer: Self-pay | Admitting: Internal Medicine

## 2018-02-13 DIAGNOSIS — E519 Thiamine deficiency, unspecified: Secondary | ICD-10-CM | POA: Diagnosis not present

## 2018-02-13 DIAGNOSIS — D508 Other iron deficiency anemias: Secondary | ICD-10-CM | POA: Diagnosis not present

## 2018-02-13 DIAGNOSIS — D539 Nutritional anemia, unspecified: Secondary | ICD-10-CM | POA: Diagnosis not present

## 2018-02-13 DIAGNOSIS — E118 Type 2 diabetes mellitus with unspecified complications: Secondary | ICD-10-CM

## 2018-02-13 DIAGNOSIS — E039 Hypothyroidism, unspecified: Secondary | ICD-10-CM | POA: Diagnosis not present

## 2018-02-13 DIAGNOSIS — E538 Deficiency of other specified B group vitamins: Secondary | ICD-10-CM | POA: Diagnosis not present

## 2018-02-13 DIAGNOSIS — E559 Vitamin D deficiency, unspecified: Secondary | ICD-10-CM | POA: Diagnosis not present

## 2018-02-13 DIAGNOSIS — E785 Hyperlipidemia, unspecified: Secondary | ICD-10-CM

## 2018-02-13 DIAGNOSIS — R300 Dysuria: Secondary | ICD-10-CM | POA: Diagnosis not present

## 2018-02-13 DIAGNOSIS — I10 Essential (primary) hypertension: Secondary | ICD-10-CM | POA: Diagnosis not present

## 2018-02-13 LAB — URINALYSIS, ROUTINE W REFLEX MICROSCOPIC
Bilirubin Urine: NEGATIVE
Hgb urine dipstick: NEGATIVE
Ketones, ur: NEGATIVE
LEUKOCYTES UA: NEGATIVE
Nitrite: NEGATIVE
RBC / HPF: NONE SEEN (ref 0–?)
Specific Gravity, Urine: 1.015 (ref 1.000–1.030)
Total Protein, Urine: NEGATIVE
URINE GLUCOSE: 250 — AB
Urobilinogen, UA: 0.2 (ref 0.0–1.0)
pH: 5.5 (ref 5.0–8.0)

## 2018-02-13 LAB — CBC WITH DIFFERENTIAL/PLATELET
BASOS ABS: 0 10*3/uL (ref 0.0–0.1)
Basophils Relative: 0.4 % (ref 0.0–3.0)
EOS ABS: 0.3 10*3/uL (ref 0.0–0.7)
Eosinophils Relative: 2.9 % (ref 0.0–5.0)
HCT: 33 % — ABNORMAL LOW (ref 39.0–52.0)
Hemoglobin: 10.6 g/dL — ABNORMAL LOW (ref 13.0–17.0)
LYMPHS ABS: 2.1 10*3/uL (ref 0.7–4.0)
Lymphocytes Relative: 23.9 % (ref 12.0–46.0)
MCHC: 32.1 g/dL (ref 30.0–36.0)
MCV: 86.3 fl (ref 78.0–100.0)
MONO ABS: 0.5 10*3/uL (ref 0.1–1.0)
Monocytes Relative: 6.1 % (ref 3.0–12.0)
NEUTROS ABS: 5.8 10*3/uL (ref 1.4–7.7)
NEUTROS PCT: 66.7 % (ref 43.0–77.0)
PLATELETS: 304 10*3/uL (ref 150.0–400.0)
RBC: 3.82 Mil/uL — ABNORMAL LOW (ref 4.22–5.81)
RDW: 16.6 % — ABNORMAL HIGH (ref 11.5–15.5)
WBC: 8.8 10*3/uL (ref 4.0–10.5)

## 2018-02-13 LAB — COMPREHENSIVE METABOLIC PANEL
ALT: 16 U/L (ref 0–53)
AST: 23 U/L (ref 0–37)
Albumin: 3.6 g/dL (ref 3.5–5.2)
Alkaline Phosphatase: 91 U/L (ref 39–117)
BILIRUBIN TOTAL: 0.4 mg/dL (ref 0.2–1.2)
BUN: 27 mg/dL — ABNORMAL HIGH (ref 6–23)
CO2: 26 meq/L (ref 19–32)
CREATININE: 2.14 mg/dL — AB (ref 0.40–1.50)
Calcium: 9.4 mg/dL (ref 8.4–10.5)
Chloride: 104 mEq/L (ref 96–112)
GFR: 40.71 mL/min — ABNORMAL LOW (ref >60.00)
GLUCOSE: 59 mg/dL — AB (ref 70–99)
Potassium: 3.7 mEq/L (ref 3.5–5.1)
Sodium: 141 mEq/L (ref 135–145)
Total Protein: 8.3 g/dL (ref 6.0–8.3)

## 2018-02-13 LAB — LIPID PANEL
CHOL/HDL RATIO: 5
Cholesterol: 139 mg/dL (ref 0–200)
HDL: 29.4 mg/dL — ABNORMAL LOW (ref >39.00)
LDL Cholesterol: 90 mg/dL (ref 0–99)
NONHDL: 109.84
Triglycerides: 101 mg/dL (ref 0.0–149.0)
VLDL: 20.2 mg/dL (ref 0.0–40.0)

## 2018-02-13 LAB — VITAMIN D 25 HYDROXY (VIT D DEFICIENCY, FRACTURES): VITD: 42.79 ng/mL (ref 30.00–100.00)

## 2018-02-13 LAB — HEMOGLOBIN A1C: Hgb A1c MFr Bld: 8.4 % — ABNORMAL HIGH (ref 4.6–6.5)

## 2018-02-13 LAB — VITAMIN B12: Vitamin B-12: >1500 pg/mL — ABNORMAL HIGH (ref 211–911)

## 2018-02-13 LAB — IBC PANEL
IRON: 25 ug/dL — AB (ref 42–165)
SATURATION RATIOS: 9.3 % — AB (ref 20.0–50.0)
TRANSFERRIN: 193 mg/dL — AB (ref 212.0–360.0)

## 2018-02-13 LAB — FERRITIN: Ferritin: 302.7 ng/mL (ref 22.0–322.0)

## 2018-02-13 LAB — FOLATE: Folate: 21.3 ng/mL (ref >5.9)

## 2018-02-13 LAB — TSH: TSH: 3.69 u[IU]/mL (ref 0.35–4.50)

## 2018-02-13 NOTE — Addendum Note (Signed)
Addended by: Karle Barr on: 02/13/2018 08:25 AM   Modules accepted: Orders

## 2018-02-14 ENCOUNTER — Other Ambulatory Visit: Payer: Self-pay | Admitting: Internal Medicine

## 2018-02-14 DIAGNOSIS — D509 Iron deficiency anemia, unspecified: Secondary | ICD-10-CM

## 2018-02-14 DIAGNOSIS — D508 Other iron deficiency anemias: Secondary | ICD-10-CM

## 2018-02-14 DIAGNOSIS — D500 Iron deficiency anemia secondary to blood loss (chronic): Secondary | ICD-10-CM

## 2018-02-14 MED ORDER — FERROUS SULFATE 325 (65 FE) MG PO TABS: 325.0000 mg | ORAL_TABLET | Freq: Three times a day (TID) | ORAL | 1 refills | Status: DC

## 2018-02-14 MED ORDER — VITAMIN C 500 MG PO TABS: 1000.0000 mg | ORAL_TABLET | Freq: Two times a day (BID) | ORAL | 1 refills | Status: DC

## 2018-02-15 ENCOUNTER — Other Ambulatory Visit: Payer: Self-pay | Admitting: Internal Medicine

## 2018-02-15 DIAGNOSIS — E1121 Type 2 diabetes mellitus with diabetic nephropathy: Secondary | ICD-10-CM

## 2018-02-15 DIAGNOSIS — Z79891 Long term (current) use of opiate analgesic: Secondary | ICD-10-CM | POA: Insufficient documentation

## 2018-02-15 DIAGNOSIS — E1142 Type 2 diabetes mellitus with diabetic polyneuropathy: Secondary | ICD-10-CM | POA: Diagnosis not present

## 2018-02-15 DIAGNOSIS — M150 Primary generalized (osteo)arthritis: Secondary | ICD-10-CM

## 2018-02-15 DIAGNOSIS — M160 Bilateral primary osteoarthritis of hip: Secondary | ICD-10-CM

## 2018-02-15 DIAGNOSIS — M159 Polyosteoarthritis, unspecified: Secondary | ICD-10-CM

## 2018-02-15 DIAGNOSIS — M5416 Radiculopathy, lumbar region: Secondary | ICD-10-CM

## 2018-02-15 MED ORDER — NALOXONE HCL 4 MG/0.1ML NA LIQD: 1.0000 | Freq: Once | NASAL | 2 refills | Status: AC

## 2018-02-15 MED ORDER — HYDROCODONE-ACETAMINOPHEN 10-325 MG PO TABS: 1.0000 | ORAL_TABLET | Freq: Three times a day (TID) | ORAL | 0 refills | Status: DC | PRN

## 2018-02-15 NOTE — Telephone Encounter (Signed)
Pt is requesting an increase of the hydrocodone doses per day. Please advise.

## 2018-02-18 LAB — VITAMIN B1: VITAMIN B1 (THIAMINE): 124 nmol/L — AB (ref 8–30)

## 2018-02-19 ENCOUNTER — Other Ambulatory Visit (HOSPITAL_COMMUNITY): Payer: Medicare HMO

## 2018-02-19 ENCOUNTER — Ambulatory Visit (HOSPITAL_COMMUNITY): Payer: Medicare HMO | Attending: Cardiovascular Disease

## 2018-02-19 ENCOUNTER — Other Ambulatory Visit: Payer: Self-pay

## 2018-02-19 DIAGNOSIS — I088 Other rheumatic multiple valve diseases: Secondary | ICD-10-CM | POA: Insufficient documentation

## 2018-02-19 DIAGNOSIS — E785 Hyperlipidemia, unspecified: Secondary | ICD-10-CM | POA: Diagnosis not present

## 2018-02-19 DIAGNOSIS — N189 Chronic kidney disease, unspecified: Secondary | ICD-10-CM | POA: Insufficient documentation

## 2018-02-19 DIAGNOSIS — G4733 Obstructive sleep apnea (adult) (pediatric): Secondary | ICD-10-CM | POA: Insufficient documentation

## 2018-02-19 DIAGNOSIS — I5032 Chronic diastolic (congestive) heart failure: Secondary | ICD-10-CM | POA: Diagnosis not present

## 2018-02-19 DIAGNOSIS — I130 Hypertensive heart and chronic kidney disease with heart failure and stage 1 through stage 4 chronic kidney disease, or unspecified chronic kidney disease: Secondary | ICD-10-CM | POA: Insufficient documentation

## 2018-02-19 DIAGNOSIS — Z6841 Body Mass Index (BMI) 40.0 and over, adult: Secondary | ICD-10-CM | POA: Insufficient documentation

## 2018-02-19 DIAGNOSIS — E1122 Type 2 diabetes mellitus with diabetic chronic kidney disease: Secondary | ICD-10-CM | POA: Diagnosis not present

## 2018-02-19 DIAGNOSIS — R6889 Other general symptoms and signs: Secondary | ICD-10-CM | POA: Diagnosis not present

## 2018-02-19 MED ORDER — PERFLUTREN LIPID MICROSPHERE
1.0000 mL | INTRAVENOUS | Status: AC | PRN
  Administered 2018-02-19: 3 mL via INTRAVENOUS

## 2018-02-27 ENCOUNTER — Ambulatory Visit (INDEPENDENT_AMBULATORY_CARE_PROVIDER_SITE_OTHER): Payer: Commercial Managed Care - HMO | Admitting: Physician Assistant

## 2018-03-06 ENCOUNTER — Encounter: Payer: Self-pay | Admitting: Internal Medicine

## 2018-03-06 ENCOUNTER — Ambulatory Visit
Admission: RE | Admit: 2018-03-06 | Discharge: 2018-03-06 | Disposition: A | Discharge status: Home / Self Care | Payer: Medicare HMO | Source: Ambulatory Visit | Attending: Internal Medicine | Admitting: Internal Medicine

## 2018-03-06 DIAGNOSIS — R6889 Other general symptoms and signs: Secondary | ICD-10-CM | POA: Diagnosis not present

## 2018-03-06 DIAGNOSIS — M5416 Radiculopathy, lumbar region: Secondary | ICD-10-CM

## 2018-03-06 DIAGNOSIS — M48061 Spinal stenosis, lumbar region without neurogenic claudication: Secondary | ICD-10-CM | POA: Diagnosis not present

## 2018-03-09 ENCOUNTER — Telehealth: Payer: Self-pay

## 2018-03-09 NOTE — Telephone Encounter (Signed)
Unfortunately I am unable to offer NSAID due to his CKD;   I do not treat chronic pain, but I can refer to pain management if he wants this

## 2018-03-09 NOTE — Telephone Encounter (Signed)
Copied from [place] [id number]. Topic: Quick Communication - Other Results >> Mar 09, 2018  1:54 PM Antonieta Iba C wrote: Pt called in for him imaging results. -- pt says that he is still having leg pain, pt would like to be advised on what he should do?   Please advise.  CB: [phone]     >> Mar 09, 2018  2:15 PM Morphies, Isidoro Donning wrote: Ordered by Dr Jenny Reichmann.

## 2018-03-12 NOTE — Telephone Encounter (Signed)
This would best be directed to PCP who order may 28 testing  thanks

## 2018-03-12 NOTE — Telephone Encounter (Signed)
Pt would not like to go to pain management because he is already taking pain meds.  He stated that he was unaware of his kideys "being bad" from his 02/13/18 appointment. I informed patient that I would have PCP's assistant reach out with him to further discuss since I seen notations from her on those lab results.

## 2018-03-19 ENCOUNTER — Ambulatory Visit (INDEPENDENT_AMBULATORY_CARE_PROVIDER_SITE_OTHER): Payer: Commercial Managed Care - HMO | Admitting: Physician Assistant

## 2018-03-23 DIAGNOSIS — H5213 Myopia, bilateral: Secondary | ICD-10-CM | POA: Diagnosis not present

## 2018-03-23 DIAGNOSIS — H52223 Regular astigmatism, bilateral: Secondary | ICD-10-CM | POA: Diagnosis not present

## 2018-03-28 ENCOUNTER — Ambulatory Visit (INDEPENDENT_AMBULATORY_CARE_PROVIDER_SITE_OTHER): Payer: Self-pay | Admitting: Physician Assistant

## 2018-04-10 ENCOUNTER — Ambulatory Visit (INDEPENDENT_AMBULATORY_CARE_PROVIDER_SITE_OTHER): Payer: Self-pay | Admitting: Orthopaedic Surgery

## 2018-04-16 ENCOUNTER — Other Ambulatory Visit: Payer: Self-pay | Admitting: Internal Medicine

## 2018-04-16 DIAGNOSIS — M160 Bilateral primary osteoarthritis of hip: Secondary | ICD-10-CM

## 2018-04-16 DIAGNOSIS — E1121 Type 2 diabetes mellitus with diabetic nephropathy: Secondary | ICD-10-CM

## 2018-04-16 DIAGNOSIS — M159 Polyosteoarthritis, unspecified: Secondary | ICD-10-CM

## 2018-04-16 DIAGNOSIS — M150 Primary generalized (osteo)arthritis: Principal | ICD-10-CM

## 2018-04-16 DIAGNOSIS — M5416 Radiculopathy, lumbar region: Secondary | ICD-10-CM

## 2018-05-11 ENCOUNTER — Other Ambulatory Visit: Payer: Self-pay | Admitting: Internal Medicine

## 2018-05-15 DIAGNOSIS — Z961 Presence of intraocular lens: Secondary | ICD-10-CM | POA: Insufficient documentation

## 2018-05-31 ENCOUNTER — Encounter: Payer: Self-pay | Admitting: Internal Medicine

## 2018-05-31 ENCOUNTER — Ambulatory Visit (INDEPENDENT_AMBULATORY_CARE_PROVIDER_SITE_OTHER): Payer: Medicare HMO | Admitting: Internal Medicine

## 2018-05-31 VITALS — BP 130/70 | HR 86 | Temp 98.0°F | Resp 16 | Ht 72.0 in | Wt >= 6400 oz

## 2018-05-31 DIAGNOSIS — K439 Ventral hernia without obstruction or gangrene: Secondary | ICD-10-CM

## 2018-05-31 DIAGNOSIS — E559 Vitamin D deficiency, unspecified: Secondary | ICD-10-CM | POA: Diagnosis not present

## 2018-05-31 DIAGNOSIS — C189 Malignant neoplasm of colon, unspecified: Secondary | ICD-10-CM

## 2018-05-31 DIAGNOSIS — K5904 Chronic idiopathic constipation: Secondary | ICD-10-CM

## 2018-05-31 DIAGNOSIS — E039 Hypothyroidism, unspecified: Secondary | ICD-10-CM | POA: Diagnosis not present

## 2018-05-31 DIAGNOSIS — Z794 Long term (current) use of insulin: Secondary | ICD-10-CM

## 2018-05-31 DIAGNOSIS — I10 Essential (primary) hypertension: Secondary | ICD-10-CM

## 2018-05-31 DIAGNOSIS — T402X5A Adverse effect of other opioids, initial encounter: Secondary | ICD-10-CM | POA: Insufficient documentation

## 2018-05-31 DIAGNOSIS — E118 Type 2 diabetes mellitus with unspecified complications: Secondary | ICD-10-CM

## 2018-05-31 DIAGNOSIS — K5903 Drug induced constipation: Secondary | ICD-10-CM | POA: Insufficient documentation

## 2018-05-31 DIAGNOSIS — D508 Other iron deficiency anemias: Secondary | ICD-10-CM

## 2018-05-31 DIAGNOSIS — E538 Deficiency of other specified B group vitamins: Secondary | ICD-10-CM

## 2018-05-31 DIAGNOSIS — E519 Thiamine deficiency, unspecified: Secondary | ICD-10-CM

## 2018-05-31 LAB — POCT GLYCOSYLATED HEMOGLOBIN (HGB A1C): HEMOGLOBIN A1C: 7.2 % — AB (ref 4.0–5.6)

## 2018-05-31 MED ORDER — LINACLOTIDE 145 MCG PO CAPS: 145.0000 ug | ORAL_CAPSULE | Freq: Every day | ORAL | 1 refills | Status: DC

## 2018-05-31 NOTE — Progress Notes (Signed)
Subjective:  Patient ID: [name], male    DOB: 17-Jan-1956  Age: 62 y.o. MRN: [id number]  CC: Anemia; Hypertension; and Diabetes   HPI Loc Feinstein presents for f/up - He has intentionally lost 40 pounds with lifestyle modifications since I last saw him.  He has noticed a hernia in the upper part of his stomach.  It bulges when he coughs or rolls over in the bed.  He denies abdominal pain, nausea, vomiting, diarrhea, or loss of appetite.  He complains of chronic constipation.  Outpatient Medications Prior to Visit  Medication Sig Dispense Refill  . albuterol (PROVENTIL HFA;VENTOLIN HFA) 108 (90 Base) MCG/ACT inhaler Inhale 2 puffs into the lungs every 6 (six) hours as needed for wheezing or shortness of breath. 1 Inhaler 1  . aspirin EC 81 MG tablet     . atorvastatin (LIPITOR) 40 MG tablet TAKE 1 TABLET BY MOUTH DAILY. 90 tablet 1  . bisoprolol (ZEBETA) 10 MG tablet TAKE 1 TABLET BY MOUTH EVERY EVENING. 90 tablet 1  . Cholecalciferol 2000 units TABS Take 1 tablet (2,000 Units total) by mouth daily. 90 tablet 1  . cyanocobalamin 2000 MCG tablet Take 1 tablet (2,000 mcg total) by mouth daily. 90 tablet 1  . cycloSPORINE (RESTASIS) 0.05 % ophthalmic emulsion Place 1 drop into both eyes 2 (two) times daily. 0.4 mL 5  . Empagliflozin-metFORMIN HCl (SYNJARDY) 01-999 MG TABS Take 1 tablet by mouth 2 (two) times daily. 180 tablet 1  . ferrous sulfate (FEROSUL) 325 (65 FE) MG tablet Take 1 tablet (325 mg total) by mouth 3 (three) times daily with meals. 270 tablet 1  . furosemide (LASIX) 80 MG tablet TAKE 1 TABLET BY MOUTH 2 TIMES DAILY. 60 tablet 3  . gabapentin (NEURONTIN) 100 MG capsule Take 1 capsule (100 mg total) by mouth 3 (three) times daily. 90 capsule 5  . glucose blood (ONETOUCH VERIO) test strip USE  STRIP TO CHECK GLUCOSE THREE TIMES DAILY 100 each 5  . HYDROcodone-acetaminophen (NORCO) 10-325 MG tablet TAKE 1 TABLET BY MOUTH EVERY 8 HOURS AS NEEDED. 90 tablet 0  . Insulin  Detemir (LEVEMIR FLEXTOUCH) 100 UNIT/ML Pen Inject 240 Units into the skin every morning. 75 mL 5  . levothyroxine (SYNTHROID, LEVOTHROID) 50 MCG tablet TAKE 1 TABLET BY MOUTH DAILY BEFORE BREAKFAST. 90 tablet 0  . losartan (COZAAR) 25 MG tablet Take 2 tablets (50 mg total) by mouth daily. 180 tablet 3  . Multiple Vitamins-Minerals (MULTIVITAMIN ADULT PO) Take by mouth.    Marland Kitchen omeprazole (PRILOSEC) 40 MG capsule Take 1 tablet by mouth 30 min before breakfast. 90 capsule 3  . polyethylene glycol powder (GLYCOLAX/MIRALAX) powder TAKE 17 GRAMS MIXED IN LIQUID BY MOUTH DAILY AS NEEDED FOR MILD CONSTIPATION. 527 g 5  . potassium chloride SA (K-DUR,KLOR-CON) 20 MEQ tablet TAKE 1 TABLET BY MOUTH DAILY. 30 tablet 3  . thiamine (VITAMIN B-1) 100 MG tablet TAKE 1 TABLET BY MOUTH DAILY. 90 tablet 1  . ULTICARE SHORT PEN NEEDLES 31G X 8 MM MISC USE WITH INSULIN PENS DAILY. 100 each 3  . VENTOLIN HFA 108 (90 Base) MCG/ACT inhaler INHALE 2 PUFFS INTO THE LUNGS EVERY 6 HOURS AS NEEDED FOR WHEEZING OR SHORTNESS OF BREATH. 18 g 1  . vitamin B-6 (PYRIDOXINE) 25 MG tablet Take 1 tablet (25 mg total) by mouth daily. 90 tablet 1  . vitamin C (ASCORBIC ACID) 500 MG tablet Take 2 tablets (1,000 mg total) by mouth 2 (two) times daily. Reported  on 01/19/2016 180 tablet 1  . vitamin E 200 UNIT capsule Take 400 Units by mouth daily. Reported on 01/19/2016    . Vitamins A & D (VITAMIN A & D) 10000-400 units TABS     . zinc gluconate 50 MG tablet Take 1 tablet (50 mg total) by mouth daily. Reported on 01/19/2016 90 tablet 1  . zolpidem (AMBIEN) 10 MG tablet Take 1 tablet (10 mg total) by mouth at bedtime as needed for sleep. 30 tablet 0   No facility-administered medications prior to visit.     ROS Review of Systems  Constitutional: Negative for appetite change, diaphoresis, fatigue and unexpected weight change.  HENT: Negative.  Negative for trouble swallowing.   Eyes: Negative for visual disturbance.  Respiratory: Negative  for cough, chest tightness, shortness of breath and wheezing.   Cardiovascular: Negative for chest pain, palpitations and leg swelling.  Gastrointestinal: Positive for constipation. Negative for abdominal pain, blood in stool, diarrhea, nausea and vomiting.  Endocrine: Negative for polydipsia, polyphagia and polyuria.  Genitourinary: Negative.  Negative for difficulty urinating, frequency and urgency.  Musculoskeletal: Positive for arthralgias. Negative for back pain and myalgias.  Allergic/Immunologic: Negative.   Neurological: Negative.  Negative for dizziness, weakness and light-headedness.  Hematological: Negative for adenopathy. Does not bruise/bleed easily.  Psychiatric/Behavioral: Negative.     Objective:  BP 130/70 (BP Location: Left Arm, Patient Position: Sitting, Cuff Size: Large)   Pulse 86   Temp 98 F (36.7 C) (Oral)   Resp 16   Ht 6' (1.829 m)   Wt (!) 458 lb 12 oz (208.1 kg)   SpO2 98%   BMI 62.22 kg/m   BP Readings from Last 3 Encounters:  05/31/18 130/70  02/08/18 (!) 120/52  02/06/18 130/80    Wt Readings from Last 3 Encounters:  05/31/18 (!) 458 lb 12 oz (208.1 kg)  02/08/18 (!) 477 lb (216.4 kg)  02/06/18 (!) 497 lb (225.4 kg)    Physical Exam  Constitutional: He is oriented to person, place, and time. No distress.  HENT:  Mouth/Throat: Oropharynx is clear and moist. No oropharyngeal exudate.  Eyes: Conjunctivae are normal. No scleral icterus.  Neck: Normal range of motion. Neck supple. No JVD present. No thyromegaly present.  Cardiovascular: Normal rate, regular rhythm and normal heart sounds. Exam reveals no gallop and no friction rub.  No murmur heard. Pulmonary/Chest: Effort normal and breath sounds normal. No respiratory distress. He has no wheezes. He has no rales.  Abdominal: Soft. Bowel sounds are normal. He exhibits no mass. There is no hepatosplenomegaly. There is no tenderness. There is no guarding. A hernia is present. Hernia confirmed  positive in the ventral area.  There is a small ventral hernia in the upper abdomen just to the right side of midline.  It is only noticed with Valsalva maneuver and spontaneously reduces.  Musculoskeletal: Normal range of motion. He exhibits no edema, tenderness or deformity.  Lymphadenopathy:    He has no cervical adenopathy.  Neurological: He is alert and oriented to person, place, and time.  Skin: Skin is warm and dry. No rash noted. He is not diaphoretic.  Vitals reviewed.   Lab Results  Component Value Date   WBC 8.1 06/01/2018   HGB 10.3 (L) 06/01/2018   HCT 32.2 (L) 06/01/2018   PLT 279.0 06/01/2018   GLUCOSE 149 (H) 06/01/2018   CHOL 139 02/13/2018   TRIG 101.0 02/13/2018   HDL 29.40 (L) 02/13/2018   LDLDIRECT 136.8 12/12/2006   [place]  90 02/13/2018   ALT 16 02/13/2018   AST 23 02/13/2018   NA 140 06/01/2018   K 4.7 06/01/2018   CL 103 06/01/2018   CREATININE 2.14 (H) 06/01/2018   BUN 38 (H) 06/01/2018   CO2 24 06/01/2018   TSH 2.84 06/01/2018   PSA 1.49 10/17/2017   HGBA1C 7.2 (A) 05/31/2018   MICROALBUR 1.0 06/01/2018    Mr Lumbar Spine Wo Contrast  Result Date: 03/06/2018 CLINICAL DATA:  Left lumbar radiculopathy EXAM: MRI LUMBAR SPINE WITHOUT CONTRAST TECHNIQUE: Multiplanar, multisequence MR imaging of the lumbar spine was performed. No intravenous contrast was administered. COMPARISON:  Lumbar radiographs 01/29/2018 FINDINGS: Segmentation:  Normal Alignment:  Normal Vertebrae:  Normal bone marrow.  Negative for fracture or mass Conus medullaris and cauda equina: Conus extends to the L1-2 level. Conus and cauda equina appear normal. Paraspinal and other soft tissues: Negative for paraspinous mass or soft tissue swelling Disc levels: T12-L1: Negative L1-2: Negative L2-3: Mild facet degeneration.  Negative for stenosis L3-4: Mild disc bulging and mild facet degeneration. Small left-sided disc protrusion with mild subarticular stenosis on the left. L4-5: Mild disc and  mild facet degeneration. Mild subarticular stenosis bilaterally L5-S1 mild disc and mild facet degeneration.  Negative for stenosis IMPRESSION: Multilevel mild degenerative changes throughout the lumbar spine as above. Small left-sided disc protrusion L3-4 with mild subarticular stenosis. Electronically Signed   By: Franchot Gallo M.D.   On: 03/06/2018 07:53    Assessment & Plan:   Colbie was seen today for anemia, hypertension and diabetes.  Diagnoses and all orders for this visit:  Essential hypertension- His blood pressure is well controlled.  His renal function is slightly improved.  Electrolytes are normal. -     Urinalysis, Routine w reflex microscopic; Future -     Basic metabolic panel; Future  Acquired hypothyroidism- His TSH is in the normal range.  He will stay on the current dose of levothyroxine. -     TSH; Future  B12 deficiency- His H&H remain mildly low.  Will continue B12 replacement.  There also appears to be a component of anemia of chronic disease. -     CBC with Differential/Platelet; Future -     Vitamin B12; Future -     Folate; Future  Other iron deficiency anemia- As above, will continue iron replacement therapy. -     IBC panel; Future -     Ferritin; Future  Vitamin D deficiency  Ventral hernia without obstruction or gangrene -     Ambulatory referral to General Surgery  Chronic idiopathic constipation -     linaclotide (LINZESS) 145 MCG CAPS capsule; Take 1 capsule (145 mcg total) by mouth daily before breakfast.  Malignant neoplasm of colon, unspecified part of colon (HCC) -     CEA; Future  Type 2 diabetes mellitus with complication, with long-term current use of insulin (HCC) -     Microalbumin / creatinine urine ratio; Future -     Basic metabolic panel; Future -     POCT glycosylated hemoglobin (Hb A1C)  Thiamine deficiency-I will monitor his thiamine level. -     CBC with Differential/Platelet; Future -     Vitamin B1; Future   I am  having Dalvin Nawabi start on linaclotide. I am also having him maintain his vitamin E, cycloSPORINE, vitamin B-6, zinc gluconate, omeprazole, losartan, glucose blood, zolpidem, albuterol, potassium chloride SA, thiamine, Multiple Vitamins-Minerals (MULTIVITAMIN ADULT PO), aspirin EC, Vitamin A & D, cyanocobalamin, Cholecalciferol, Insulin Detemir,  polyethylene glycol powder, furosemide, VENTOLIN HFA, gabapentin, Empagliflozin-metFORMIN HCl, ULTICARE SHORT PEN NEEDLES, atorvastatin, vitamin C, ferrous sulfate, HYDROcodone-acetaminophen, bisoprolol, and levothyroxine.  Meds ordered this encounter  Medications  . linaclotide (LINZESS) 145 MCG CAPS capsule    Sig: Take 1 capsule (145 mcg total) by mouth daily before breakfast.    Dispense:  90 capsule    Refill:  1     Follow-up: Return in about 4 months (around 09/30/2018).  Scarlette Calico, MD

## 2018-05-31 NOTE — Patient Instructions (Signed)

## 2018-06-01 ENCOUNTER — Other Ambulatory Visit (INDEPENDENT_AMBULATORY_CARE_PROVIDER_SITE_OTHER): Payer: Medicare HMO

## 2018-06-01 DIAGNOSIS — E519 Thiamine deficiency, unspecified: Secondary | ICD-10-CM | POA: Diagnosis not present

## 2018-06-01 DIAGNOSIS — E039 Hypothyroidism, unspecified: Secondary | ICD-10-CM

## 2018-06-01 DIAGNOSIS — E538 Deficiency of other specified B group vitamins: Secondary | ICD-10-CM | POA: Diagnosis not present

## 2018-06-01 DIAGNOSIS — D508 Other iron deficiency anemias: Secondary | ICD-10-CM | POA: Diagnosis not present

## 2018-06-01 DIAGNOSIS — Z794 Long term (current) use of insulin: Secondary | ICD-10-CM | POA: Diagnosis not present

## 2018-06-01 DIAGNOSIS — E118 Type 2 diabetes mellitus with unspecified complications: Secondary | ICD-10-CM

## 2018-06-01 DIAGNOSIS — C189 Malignant neoplasm of colon, unspecified: Secondary | ICD-10-CM | POA: Diagnosis not present

## 2018-06-01 DIAGNOSIS — I10 Essential (primary) hypertension: Secondary | ICD-10-CM

## 2018-06-01 LAB — CBC WITH DIFFERENTIAL/PLATELET
BASOS ABS: 0 10*3/uL (ref 0.0–0.1)
Basophils Relative: 0.4 % (ref 0.0–3.0)
EOS ABS: 0.3 10*3/uL (ref 0.0–0.7)
Eosinophils Relative: 4.2 % (ref 0.0–5.0)
HEMATOCRIT: 32.2 % — AB (ref 39.0–52.0)
Hemoglobin: 10.3 g/dL — ABNORMAL LOW (ref 13.0–17.0)
LYMPHS ABS: 2.2 10*3/uL (ref 0.7–4.0)
LYMPHS PCT: 26.7 % (ref 12.0–46.0)
MCHC: 32 g/dL (ref 30.0–36.0)
MCV: 86.3 fl (ref 78.0–100.0)
Monocytes Absolute: 0.5 10*3/uL (ref 0.1–1.0)
Monocytes Relative: 5.6 % (ref 3.0–12.0)
NEUTROS ABS: 5.1 10*3/uL (ref 1.4–7.7)
Neutrophils Relative %: 63.1 % (ref 43.0–77.0)
Platelets: 279 10*3/uL (ref 150.0–400.0)
RBC: 3.73 Mil/uL — ABNORMAL LOW (ref 4.22–5.81)
RDW: 16.3 % — ABNORMAL HIGH (ref 11.5–15.5)
WBC: 8.1 10*3/uL (ref 4.0–10.5)

## 2018-06-01 LAB — URINALYSIS, ROUTINE W REFLEX MICROSCOPIC
Bilirubin Urine: NEGATIVE
Hgb urine dipstick: NEGATIVE
KETONES UR: NEGATIVE
Leukocytes, UA: NEGATIVE
Nitrite: NEGATIVE
PH: 6 (ref 5.0–8.0)
RBC / HPF: NONE SEEN (ref 0–?)
SPECIFIC GRAVITY, URINE: 1.01 (ref 1.000–1.030)
TOTAL PROTEIN, URINE-UPE24: NEGATIVE
UROBILINOGEN UA: 0.2 (ref 0.0–1.0)

## 2018-06-01 LAB — IBC PANEL
Iron: 55 ug/dL (ref 42–165)
SATURATION RATIOS: 18.6 % — AB (ref 20.0–50.0)
TRANSFERRIN: 211 mg/dL — AB (ref 212.0–360.0)

## 2018-06-01 LAB — BASIC METABOLIC PANEL
BUN: 38 mg/dL — AB (ref 6–23)
CHLORIDE: 103 meq/L (ref 96–112)
CO2: 24 meq/L (ref 19–32)
Calcium: 9.3 mg/dL (ref 8.4–10.5)
Creatinine, Ser: 2.14 mg/dL — ABNORMAL HIGH (ref 0.40–1.50)
GFR: 40.67 mL/min — ABNORMAL LOW (ref >60.00)
GLUCOSE: 149 mg/dL — AB (ref 70–99)
Potassium: 4.7 mEq/L (ref 3.5–5.1)
Sodium: 140 mEq/L (ref 135–145)

## 2018-06-01 LAB — MICROALBUMIN / CREATININE URINE RATIO
Creatinine,U: 111.6 mg/dL
Microalb Creat Ratio: 0.9 mg/g (ref 0.0–30.0)
Microalb, Ur: 1 mg/dL (ref 0.0–1.9)

## 2018-06-01 LAB — FOLATE: FOLATE: 11.4 ng/mL (ref >5.9)

## 2018-06-01 LAB — FERRITIN: Ferritin: 375.4 ng/mL — ABNORMAL HIGH (ref 22.0–322.0)

## 2018-06-01 LAB — VITAMIN B12: Vitamin B-12: 939 pg/mL — ABNORMAL HIGH (ref 211–911)

## 2018-06-01 LAB — TSH: TSH: 2.84 u[IU]/mL (ref 0.35–4.50)

## 2018-06-05 ENCOUNTER — Other Ambulatory Visit (HOSPITAL_COMMUNITY): Payer: Self-pay | Admitting: Cardiology

## 2018-06-05 ENCOUNTER — Other Ambulatory Visit: Payer: Self-pay | Admitting: Internal Medicine

## 2018-06-05 DIAGNOSIS — M5416 Radiculopathy, lumbar region: Secondary | ICD-10-CM

## 2018-06-05 DIAGNOSIS — M150 Primary generalized (osteo)arthritis: Principal | ICD-10-CM

## 2018-06-05 DIAGNOSIS — M159 Polyosteoarthritis, unspecified: Secondary | ICD-10-CM

## 2018-06-05 DIAGNOSIS — E1121 Type 2 diabetes mellitus with diabetic nephropathy: Secondary | ICD-10-CM

## 2018-06-05 DIAGNOSIS — M160 Bilateral primary osteoarthritis of hip: Secondary | ICD-10-CM

## 2018-06-07 ENCOUNTER — Encounter: Payer: Self-pay | Admitting: Internal Medicine

## 2018-06-07 LAB — CEA

## 2018-06-07 LAB — VITAMIN B1: Vitamin B1 (Thiamine): 9 nmol/L (ref 8–30)

## 2018-06-18 ENCOUNTER — Telehealth: Payer: Self-pay | Admitting: Internal Medicine

## 2018-06-18 NOTE — Telephone Encounter (Signed)
Copied from [place] [id number]. Topic: Quick Communication - See Telephone Encounter >> Jun 18, 2018  3:33 PM Blase Mess A wrote: CRM for notification. See Telephone encounter for: 06/18/18. Patient is lap band out  Please advise [phone]

## 2018-06-18 NOTE — Telephone Encounter (Signed)
Called patient, He was wanting to know could he talk with [facility] about getting his Lap Band taken out. He will follow up with them.

## 2018-06-25 ENCOUNTER — Other Ambulatory Visit: Payer: Self-pay | Admitting: Cardiology

## 2018-06-25 DIAGNOSIS — I5032 Chronic diastolic (congestive) heart failure: Secondary | ICD-10-CM

## 2018-06-25 DIAGNOSIS — I10 Essential (primary) hypertension: Secondary | ICD-10-CM

## 2018-07-04 DIAGNOSIS — H2512 Age-related nuclear cataract, left eye: Secondary | ICD-10-CM | POA: Diagnosis not present

## 2018-07-04 DIAGNOSIS — E113521 Type 2 diabetes mellitus with proliferative diabetic retinopathy with traction retinal detachment involving the macula, right eye: Secondary | ICD-10-CM | POA: Diagnosis not present

## 2018-07-04 DIAGNOSIS — Z947 Corneal transplant status: Secondary | ICD-10-CM | POA: Diagnosis not present

## 2018-07-04 DIAGNOSIS — H0259 Other disorders affecting eyelid function: Secondary | ICD-10-CM | POA: Diagnosis not present

## 2018-07-04 DIAGNOSIS — Z961 Presence of intraocular lens: Secondary | ICD-10-CM | POA: Diagnosis not present

## 2018-07-10 ENCOUNTER — Ambulatory Visit: Payer: Medicare HMO | Admitting: Internal Medicine

## 2018-07-19 DIAGNOSIS — K439 Ventral hernia without obstruction or gangrene: Secondary | ICD-10-CM | POA: Diagnosis not present

## 2018-07-26 ENCOUNTER — Other Ambulatory Visit: Payer: Self-pay | Admitting: Internal Medicine

## 2018-07-26 DIAGNOSIS — E1121 Type 2 diabetes mellitus with diabetic nephropathy: Secondary | ICD-10-CM

## 2018-07-26 DIAGNOSIS — M5416 Radiculopathy, lumbar region: Secondary | ICD-10-CM

## 2018-07-26 DIAGNOSIS — M150 Primary generalized (osteo)arthritis: Principal | ICD-10-CM

## 2018-07-26 DIAGNOSIS — M160 Bilateral primary osteoarthritis of hip: Secondary | ICD-10-CM

## 2018-07-26 DIAGNOSIS — M159 Polyosteoarthritis, unspecified: Secondary | ICD-10-CM

## 2018-08-14 ENCOUNTER — Other Ambulatory Visit: Payer: Self-pay | Admitting: Internal Medicine

## 2018-09-04 ENCOUNTER — Other Ambulatory Visit: Payer: Self-pay | Admitting: Internal Medicine

## 2018-09-10 ENCOUNTER — Other Ambulatory Visit (HOSPITAL_COMMUNITY): Payer: Self-pay | Admitting: Cardiology

## 2018-09-10 ENCOUNTER — Other Ambulatory Visit: Payer: Self-pay | Admitting: Internal Medicine

## 2018-09-10 DIAGNOSIS — E1121 Type 2 diabetes mellitus with diabetic nephropathy: Secondary | ICD-10-CM

## 2018-09-10 DIAGNOSIS — M159 Polyosteoarthritis, unspecified: Secondary | ICD-10-CM

## 2018-09-10 DIAGNOSIS — M150 Primary generalized (osteo)arthritis: Principal | ICD-10-CM

## 2018-09-10 DIAGNOSIS — M160 Bilateral primary osteoarthritis of hip: Secondary | ICD-10-CM

## 2018-09-10 DIAGNOSIS — M5416 Radiculopathy, lumbar region: Secondary | ICD-10-CM

## 2018-09-17 ENCOUNTER — Other Ambulatory Visit: Payer: Self-pay | Admitting: Internal Medicine

## 2018-09-17 DIAGNOSIS — E118 Type 2 diabetes mellitus with unspecified complications: Secondary | ICD-10-CM

## 2018-09-17 DIAGNOSIS — D508 Other iron deficiency anemias: Secondary | ICD-10-CM

## 2018-09-17 DIAGNOSIS — D500 Iron deficiency anemia secondary to blood loss (chronic): Secondary | ICD-10-CM

## 2018-09-17 DIAGNOSIS — D509 Iron deficiency anemia, unspecified: Secondary | ICD-10-CM

## 2018-10-03 DIAGNOSIS — R6889 Other general symptoms and signs: Secondary | ICD-10-CM | POA: Diagnosis not present

## 2018-10-03 DIAGNOSIS — Z6841 Body Mass Index (BMI) 40.0 and over, adult: Secondary | ICD-10-CM | POA: Diagnosis not present

## 2018-10-04 ENCOUNTER — Encounter (HOSPITAL_COMMUNITY): Payer: Self-pay

## 2018-10-05 ENCOUNTER — Other Ambulatory Visit: Payer: Self-pay | Admitting: Family

## 2018-10-05 DIAGNOSIS — M150 Primary generalized (osteo)arthritis: Principal | ICD-10-CM

## 2018-10-05 DIAGNOSIS — E1121 Type 2 diabetes mellitus with diabetic nephropathy: Secondary | ICD-10-CM

## 2018-10-05 DIAGNOSIS — M5416 Radiculopathy, lumbar region: Secondary | ICD-10-CM

## 2018-10-05 DIAGNOSIS — M160 Bilateral primary osteoarthritis of hip: Secondary | ICD-10-CM

## 2018-10-05 DIAGNOSIS — M159 Polyosteoarthritis, unspecified: Secondary | ICD-10-CM

## 2018-10-05 NOTE — Telephone Encounter (Signed)
Pt has upcoming appt with PCP.

## 2018-10-11 ENCOUNTER — Telehealth (HOSPITAL_COMMUNITY): Payer: Self-pay

## 2018-10-11 NOTE — Telephone Encounter (Signed)
Cardiac clearance faxed to central  [facility] surgery fax number [phone]

## 2018-10-25 ENCOUNTER — Encounter: Payer: Medicare HMO | Admitting: Internal Medicine

## 2018-10-29 ENCOUNTER — Telehealth: Payer: Self-pay

## 2018-10-29 DIAGNOSIS — S91104A Unspecified open wound of right lesser toe(s) without damage to nail, initial encounter: Secondary | ICD-10-CM

## 2018-10-29 DIAGNOSIS — S91109A Unspecified open wound of unspecified toe(s) without damage to nail, initial encounter: Secondary | ICD-10-CM

## 2018-10-29 NOTE — Telephone Encounter (Signed)
Copied from [facility] [id number]. Topic: Referral - Request for Referral >> Oct 26, 2018  3:36 PM Selinda Flavin B, [place] wrote: Has patient seen PCP for this complaint? Yes.   *If NO, is insurance requiring patient see PCP for this issue before PCP can refer them? Referral for which specialty: [place] Preferred provider/office: [facility]  Reason for referral: States that he has had the issue before, but he has an open wound on his big toe and on his little toe. States that he had been there before for this issue, but they do not allow patient to call and make the appointments.

## 2018-10-31 ENCOUNTER — Other Ambulatory Visit: Payer: Self-pay | Admitting: Internal Medicine

## 2018-10-31 ENCOUNTER — Telehealth: Payer: Self-pay | Admitting: Internal Medicine

## 2018-10-31 DIAGNOSIS — E1121 Type 2 diabetes mellitus with diabetic nephropathy: Secondary | ICD-10-CM

## 2018-10-31 DIAGNOSIS — M160 Bilateral primary osteoarthritis of hip: Secondary | ICD-10-CM

## 2018-10-31 DIAGNOSIS — M5416 Radiculopathy, lumbar region: Secondary | ICD-10-CM

## 2018-10-31 DIAGNOSIS — E118 Type 2 diabetes mellitus with unspecified complications: Secondary | ICD-10-CM

## 2018-10-31 DIAGNOSIS — M159 Polyosteoarthritis, unspecified: Secondary | ICD-10-CM

## 2018-10-31 DIAGNOSIS — M150 Primary generalized (osteo)arthritis: Principal | ICD-10-CM

## 2018-11-08 MED ORDER — EMPAGLIFLOZIN-METFORMIN HCL 5-1000 MG PO TABS: 1.0000 | ORAL_TABLET | Freq: Two times a day (BID) | ORAL | 0 refills | Status: DC

## 2018-11-08 MED ORDER — BISOPROLOL FUMARATE 10 MG PO TABS: 10.0000 mg | ORAL_TABLET | Freq: Every evening | ORAL | 0 refills | Status: DC

## 2018-11-08 NOTE — Telephone Encounter (Signed)
Patient calling and states that he has been out of the SYNJARDY 01-999 MG TABS [Pharmacy Med Name: SYNJARDY 5-1,000 MG TABLET 01-999 TAB for a week now. States that he has an appointment for a physical on 11/22/2018. Would like to know if enough medication could be sent to the pharmacy to last until his appointment? Please advise.

## 2018-11-08 NOTE — Addendum Note (Signed)
Addended by: Karle Barr on: 11/08/2018 04:42 PM   Modules accepted: Orders

## 2018-11-08 NOTE — Telephone Encounter (Signed)
erx sent in for pt.

## 2018-11-09 ENCOUNTER — Other Ambulatory Visit (HOSPITAL_COMMUNITY)
Admission: RE | Admit: 2018-11-09 | Discharge: 2018-11-09 | Disposition: A | Discharge status: Home / Self Care | Payer: Medicare HMO | Source: Other Acute Inpatient Hospital | Attending: Internal Medicine | Admitting: Internal Medicine

## 2018-11-09 ENCOUNTER — Encounter (HOSPITAL_BASED_OUTPATIENT_CLINIC_OR_DEPARTMENT_OTHER): Payer: Medicare HMO | Attending: Internal Medicine

## 2018-11-09 DIAGNOSIS — Z794 Long term (current) use of insulin: Secondary | ICD-10-CM | POA: Insufficient documentation

## 2018-11-09 DIAGNOSIS — L97522 Non-pressure chronic ulcer of other part of left foot with fat layer exposed: Secondary | ICD-10-CM | POA: Diagnosis not present

## 2018-11-09 DIAGNOSIS — E1142 Type 2 diabetes mellitus with diabetic polyneuropathy: Secondary | ICD-10-CM | POA: Insufficient documentation

## 2018-11-09 DIAGNOSIS — I110 Hypertensive heart disease with heart failure: Secondary | ICD-10-CM | POA: Insufficient documentation

## 2018-11-09 DIAGNOSIS — I509 Heart failure, unspecified: Secondary | ICD-10-CM | POA: Insufficient documentation

## 2018-11-09 DIAGNOSIS — A4901 Methicillin susceptible Staphylococcus aureus infection, unspecified site: Secondary | ICD-10-CM | POA: Insufficient documentation

## 2018-11-09 DIAGNOSIS — L97513 Non-pressure chronic ulcer of other part of right foot with necrosis of muscle: Secondary | ICD-10-CM | POA: Diagnosis not present

## 2018-11-09 DIAGNOSIS — G4730 Sleep apnea, unspecified: Secondary | ICD-10-CM | POA: Insufficient documentation

## 2018-11-09 DIAGNOSIS — L97512 Non-pressure chronic ulcer of other part of right foot with fat layer exposed: Secondary | ICD-10-CM | POA: Insufficient documentation

## 2018-11-09 DIAGNOSIS — E11621 Type 2 diabetes mellitus with foot ulcer: Secondary | ICD-10-CM | POA: Insufficient documentation

## 2018-11-09 DIAGNOSIS — S91102A Unspecified open wound of left great toe without damage to nail, initial encounter: Secondary | ICD-10-CM | POA: Diagnosis not present

## 2018-11-12 LAB — AEROBIC CULTURE W GRAM STAIN (SUPERFICIAL SPECIMEN)

## 2018-11-12 LAB — AEROBIC CULTURE  (SUPERFICIAL SPECIMEN)

## 2018-11-15 ENCOUNTER — Ambulatory Visit (HOSPITAL_COMMUNITY)
Admission: RE | Admit: 2018-11-15 | Discharge: 2018-11-15 | Disposition: A | Discharge status: Home / Self Care | Payer: Medicare HMO | Source: Ambulatory Visit | Attending: Internal Medicine | Admitting: Internal Medicine

## 2018-11-15 ENCOUNTER — Other Ambulatory Visit (HOSPITAL_COMMUNITY): Payer: Self-pay | Admitting: Internal Medicine

## 2018-11-15 DIAGNOSIS — L97401 Non-pressure chronic ulcer of unspecified heel and midfoot limited to breakdown of skin: Principal | ICD-10-CM

## 2018-11-15 DIAGNOSIS — E08621 Diabetes mellitus due to underlying condition with foot ulcer: Secondary | ICD-10-CM | POA: Insufficient documentation

## 2018-11-15 DIAGNOSIS — L97519 Non-pressure chronic ulcer of other part of right foot with unspecified severity: Secondary | ICD-10-CM | POA: Diagnosis not present

## 2018-11-15 DIAGNOSIS — E11621 Type 2 diabetes mellitus with foot ulcer: Secondary | ICD-10-CM | POA: Diagnosis not present

## 2018-11-16 DIAGNOSIS — I110 Hypertensive heart disease with heart failure: Secondary | ICD-10-CM | POA: Diagnosis not present

## 2018-11-16 DIAGNOSIS — L97512 Non-pressure chronic ulcer of other part of right foot with fat layer exposed: Secondary | ICD-10-CM | POA: Diagnosis not present

## 2018-11-16 DIAGNOSIS — G4730 Sleep apnea, unspecified: Secondary | ICD-10-CM | POA: Diagnosis not present

## 2018-11-16 DIAGNOSIS — E11621 Type 2 diabetes mellitus with foot ulcer: Secondary | ICD-10-CM | POA: Diagnosis not present

## 2018-11-16 DIAGNOSIS — Z794 Long term (current) use of insulin: Secondary | ICD-10-CM | POA: Diagnosis not present

## 2018-11-16 DIAGNOSIS — L97522 Non-pressure chronic ulcer of other part of left foot with fat layer exposed: Secondary | ICD-10-CM | POA: Diagnosis not present

## 2018-11-16 DIAGNOSIS — E1142 Type 2 diabetes mellitus with diabetic polyneuropathy: Secondary | ICD-10-CM | POA: Diagnosis not present

## 2018-11-16 DIAGNOSIS — I509 Heart failure, unspecified: Secondary | ICD-10-CM | POA: Diagnosis not present

## 2018-11-16 DIAGNOSIS — R6889 Other general symptoms and signs: Secondary | ICD-10-CM | POA: Diagnosis not present

## 2018-11-16 DIAGNOSIS — A4901 Methicillin susceptible Staphylococcus aureus infection, unspecified site: Secondary | ICD-10-CM | POA: Diagnosis not present

## 2018-11-22 ENCOUNTER — Other Ambulatory Visit (INDEPENDENT_AMBULATORY_CARE_PROVIDER_SITE_OTHER): Payer: Medicare HMO

## 2018-11-22 ENCOUNTER — Ambulatory Visit (INDEPENDENT_AMBULATORY_CARE_PROVIDER_SITE_OTHER): Payer: Medicare HMO | Admitting: Internal Medicine

## 2018-11-22 ENCOUNTER — Encounter: Payer: Self-pay | Admitting: Internal Medicine

## 2018-11-22 ENCOUNTER — Other Ambulatory Visit: Payer: Self-pay

## 2018-11-22 VITALS — BP 120/68 | HR 62 | Temp 98.1°F | Resp 16 | Ht 72.0 in | Wt >= 6400 oz

## 2018-11-22 DIAGNOSIS — N400 Enlarged prostate without lower urinary tract symptoms: Secondary | ICD-10-CM

## 2018-11-22 DIAGNOSIS — I10 Essential (primary) hypertension: Secondary | ICD-10-CM | POA: Diagnosis not present

## 2018-11-22 DIAGNOSIS — Z23 Encounter for immunization: Secondary | ICD-10-CM | POA: Diagnosis not present

## 2018-11-22 DIAGNOSIS — E559 Vitamin D deficiency, unspecified: Secondary | ICD-10-CM | POA: Diagnosis not present

## 2018-11-22 DIAGNOSIS — Z114 Encounter for screening for human immunodeficiency virus [HIV]: Secondary | ICD-10-CM

## 2018-11-22 DIAGNOSIS — Z0000 Encounter for general adult medical examination without abnormal findings: Secondary | ICD-10-CM

## 2018-11-22 DIAGNOSIS — Z01810 Encounter for preprocedural cardiovascular examination: Secondary | ICD-10-CM

## 2018-11-22 DIAGNOSIS — E118 Type 2 diabetes mellitus with unspecified complications: Secondary | ICD-10-CM | POA: Diagnosis not present

## 2018-11-22 DIAGNOSIS — M150 Primary generalized (osteo)arthritis: Secondary | ICD-10-CM

## 2018-11-22 DIAGNOSIS — E538 Deficiency of other specified B group vitamins: Secondary | ICD-10-CM | POA: Diagnosis not present

## 2018-11-22 DIAGNOSIS — E1121 Type 2 diabetes mellitus with diabetic nephropathy: Secondary | ICD-10-CM

## 2018-11-22 DIAGNOSIS — D508 Other iron deficiency anemias: Secondary | ICD-10-CM | POA: Diagnosis not present

## 2018-11-22 DIAGNOSIS — E519 Thiamine deficiency, unspecified: Secondary | ICD-10-CM

## 2018-11-22 DIAGNOSIS — M159 Polyosteoarthritis, unspecified: Secondary | ICD-10-CM

## 2018-11-22 DIAGNOSIS — M5416 Radiculopathy, lumbar region: Secondary | ICD-10-CM

## 2018-11-22 DIAGNOSIS — M160 Bilateral primary osteoarthritis of hip: Secondary | ICD-10-CM

## 2018-11-22 LAB — BASIC METABOLIC PANEL
BUN: 30 mg/dL — ABNORMAL HIGH (ref 6–23)
CO2: 32 mEq/L (ref 19–32)
Calcium: 9 mg/dL (ref 8.4–10.5)
Chloride: 102 mEq/L (ref 96–112)
Creatinine, Ser: 1.88 mg/dL — ABNORMAL HIGH (ref 0.40–1.50)
GFR: 44.36 mL/min — AB (ref >60.00)
Glucose, Bld: 47 mg/dL — CL (ref 70–99)
Potassium: 4 mEq/L (ref 3.5–5.1)
Sodium: 143 mEq/L (ref 135–145)

## 2018-11-22 LAB — CBC WITH DIFFERENTIAL/PLATELET
Basophils Absolute: 0 10*3/uL (ref 0.0–0.1)
Basophils Relative: 0.3 % (ref 0.0–3.0)
Eosinophils Absolute: 0.2 10*3/uL (ref 0.0–0.7)
Eosinophils Relative: 2.9 % (ref 0.0–5.0)
HCT: 32.4 % — ABNORMAL LOW (ref 39.0–52.0)
Hemoglobin: 10.2 g/dL — ABNORMAL LOW (ref 13.0–17.0)
Lymphocytes Relative: 23.3 % (ref 12.0–46.0)
Lymphs Abs: 1.8 10*3/uL (ref 0.7–4.0)
MCHC: 31.5 g/dL (ref 30.0–36.0)
MCV: 88.4 fl (ref 78.0–100.0)
Monocytes Absolute: 0.5 10*3/uL (ref 0.1–1.0)
Monocytes Relative: 6 % (ref 3.0–12.0)
NEUTROS PCT: 67.5 % (ref 43.0–77.0)
Neutro Abs: 5.3 10*3/uL (ref 1.4–7.7)
Platelets: 318 10*3/uL (ref 150.0–400.0)
RBC: 3.66 Mil/uL — AB (ref 4.22–5.81)
RDW: 15.3 % (ref 11.5–15.5)
WBC: 7.8 10*3/uL (ref 4.0–10.5)

## 2018-11-22 LAB — VITAMIN B12: Vitamin B-12: 864 pg/mL (ref 211–911)

## 2018-11-22 LAB — PSA: PSA: 1.43 ng/mL (ref 0.10–4.00)

## 2018-11-22 LAB — IBC PANEL
Iron: 43 ug/dL (ref 42–165)
Saturation Ratios: 16.4 % — ABNORMAL LOW (ref 20.0–50.0)
TRANSFERRIN: 187 mg/dL — AB (ref 212.0–360.0)

## 2018-11-22 LAB — POCT GLYCOSYLATED HEMOGLOBIN (HGB A1C): Hemoglobin A1C: 9.3 % — AB (ref 4.0–5.6)

## 2018-11-22 LAB — VITAMIN D 25 HYDROXY (VIT D DEFICIENCY, FRACTURES): VITD: 49.29 ng/mL (ref 30.00–100.00)

## 2018-11-22 LAB — FOLATE: Folate: 10.9 ng/mL (ref >5.9)

## 2018-11-22 LAB — FERRITIN: Ferritin: 318.2 ng/mL (ref 22.0–322.0)

## 2018-11-22 MED ORDER — EMPAGLIFLOZIN-METFORMIN HCL 5-1000 MG PO TABS: 1.0000 | ORAL_TABLET | Freq: Two times a day (BID) | ORAL | 1 refills | Status: DC

## 2018-11-22 MED ORDER — CYCLOSPORINE 0.05 % OP EMUL: 1.0000 [drp] | Freq: Two times a day (BID) | OPHTHALMIC | 5 refills | Status: DC

## 2018-11-22 NOTE — Patient Instructions (Signed)

## 2018-11-22 NOTE — Progress Notes (Signed)
Subjective:  Patient ID: Evan Mackie, male    DOB: 1956/06/01  Age: 63 y.o. MRN: [id number]  CC: Annual Exam; Anemia; Hypertension; and Diabetes   HPI Domanique Luckett presents for a CPX.  He tells me his blood sugar has not been very well controlled.  He is usually eating the insulin but for some reason he is not taking the oral medications.  He denies polys.  He tells me his blood pressure has been well controlled and he denies any recent episodes of CP or diaphoresis.  Patient also presents for comprehensive foot exam and for pre operative clearance.  He is considering have his LAP-BAND switched over to a sleeve and needs a preop clearance  Patient has history of foot ulceration, poor circulation, peripheral neuropathy with evidence of callus formation and a history of pre-ulcerative callus. Patient is currently see by wound center for open wounds located on both great toes. Patient is in need of diabetic foot wear.   Past Medical History:  Diagnosis Date  . Anemia    iron  . CHF (congestive heart failure) (HCC)    Presumed diastolic. Echo (06/07) w.EF 45%, severe posterior HK, mild LV hypertrophy, No further work-up of abnormal echo was done. pt denies CHF  . Colon cancer ([place]) 2007   s/p sigmoid colectomy  . Diabetes mellitus    Has Hx of diabetic foot ulcer & peripheral neuropathy  type2  . Dyspnea    w/ activity  . History of PFTs 05/2009   Mild Obstructive defect  . HTN (hypertension)   . Hyperlipidemia   . Morbid obesity ([place])   . OSA on CPAP   . Pneumonia    week ago    Past Surgical History:  Procedure Laterality Date  . BARIATRIC SURGERY  12/2009   Lap Band/At [facility]  . COLONOSCOPY  multiple  . COLONOSCOPY WITH PROPOFOL N/A 11/26/2012   Procedure: COLONOSCOPY WITH PROPOFOL;  Surgeon: Gatha Mayer, MD;  Location: [facility];  Service: Endoscopy;  Laterality: N/A;  may need pre appt. with anesthesia due to morbid obesity  . COLONOSCOPY WITH PROPOFOL N/A  07/11/2017   Procedure: COLONOSCOPY WITH PROPOFOL;  Surgeon: Gatha Mayer, MD;  Location: [facility];  Service: Endoscopy;  Laterality: N/A;  . [place]   '80/Right  '84/Left  . EYE SURGERY Right 05/2017  . HERNIA REPAIR    . HIP SURGERY     bilateral  . INCISIONAL HERNIA REPAIR N/A 12/18/2013   Procedure:  REPAIR OF INCARCERATED INCISIONAL HERNIA;  Surgeon: Rolm Bookbinder, MD;  Location: [facility];  Service: General;  Laterality: N/A;  . INSERTION OF MESH N/A 12/18/2013   Procedure: INSERTION OF MESH;  Surgeon: Rolm Bookbinder, MD;  Location: [facility];  Service: General;  Laterality: N/A;  . Sigmoid Colectomy  10/2005   [place]  . TONSILLECTOMY      reports that he has never smoked. He has never used smokeless tobacco. He reports that he does not use drugs. No history on file for alcohol. family history includes CAD in his mother; Congestive Heart Failure in his mother; Diabetes in his father and mother; Heart attack (age of onset: 42) in his brother; Heart failure in his brother; Hepatitis in his mother. Allergies  Allergen Reactions  . Shellfish Allergy Anaphylaxis  . Lisinopril Cough      ROS Review of Systems  Constitutional: Positive for fatigue. Negative for appetite change, chills, diaphoresis and unexpected weight change.  HENT:  Negative.   Eyes: Negative for visual disturbance.  Respiratory: Negative for cough and chest tightness.   Cardiovascular: Negative for chest pain, palpitations and leg swelling.  Gastrointestinal: Negative for abdominal pain, blood in stool, constipation, diarrhea, nausea and vomiting.  Endocrine: Negative.  Negative for polydipsia, polyphagia and polyuria.  Genitourinary: Negative.  Negative for difficulty urinating, dysuria, hematuria, scrotal swelling and urgency.  Musculoskeletal: Positive for arthralgias and gait problem. Negative for back pain and neck pain.  Skin: Positive for wound. Negative for color change and  pallor.       He has wounds on the first and second toes on both feet.  He tells me that he is being seen at the wound care center and is having the areas debrided.  Neurological: Negative for dizziness, weakness, light-headedness and headaches.  Hematological: Negative for adenopathy. Does not bruise/bleed easily.  Psychiatric/Behavioral: Negative.     Objective:  BP 120/68 (BP Location: Left Arm, Patient Position: Sitting, Cuff Size: Large)   Pulse 62   Temp 98.1 F (36.7 C) (Oral)   Resp 16   Ht 6' (1.829 m)   Wt (!) 465 lb (210.9 kg)   SpO2 91%   BMI 63.07 kg/m   BP Readings from Last 3 Encounters:  11/22/18 120/68  05/31/18 130/70  02/08/18 (!) 120/52    Wt Readings from Last 3 Encounters:  11/22/18 (!) 465 lb (210.9 kg)  05/31/18 (!) 458 lb 12 oz (208.1 kg)  02/08/18 (!) 477 lb (216.4 kg)    Physical Exam Constitutional:      General: He is not in acute distress.    Appearance: He is obese. He is not ill-appearing, toxic-appearing or diaphoretic.  HENT:     Nose: Nose normal. No congestion.     Mouth/Throat:     Mouth: Mucous membranes are moist.     Pharynx: Oropharynx is clear. No oropharyngeal exudate or posterior oropharyngeal erythema.  Eyes:     General: No scleral icterus.    Conjunctiva/sclera: Conjunctivae normal.  Neck:     Musculoskeletal: Normal range of motion and neck supple. No neck rigidity or muscular tenderness.  Cardiovascular:     Rate and Rhythm: Normal rate and regular rhythm.     Heart sounds: No murmur. No gallop.      Comments: EKG ----  Sinus  Rhythm  WITHIN NORMAL LIMITS Pulmonary:     Effort: Pulmonary effort is normal.     Breath sounds: Normal breath sounds. No stridor. No wheezing, rhonchi or rales.  Abdominal:     General: Abdomen is flat. Bowel sounds are normal.     Palpations: There is no hepatomegaly, splenomegaly or mass.     Tenderness: There is no abdominal tenderness.  Genitourinary:    Comments: GU and rectal  exams were deferred at the patient's request.  He is morbidly obese and wheelchair-bound and is not able to position himself to have these exams completed. Musculoskeletal: Normal range of motion.        General: No swelling.     Right lower leg: Edema (1+ pitting edema) present.     Left lower leg: Edema (1+ pitting) present.  Lymphadenopathy:     Cervical: No cervical adenopathy.  Skin:    General: Skin is warm and dry.  Neurological:     General: No focal deficit present.  Psychiatric:        Mood and Affect: Mood normal.        Behavior: Behavior normal.  Lab Results  Component Value Date   WBC 7.8 11/22/2018   HGB 10.2 (L) 11/22/2018   HCT 32.4 (L) 11/22/2018   PLT 318.0 11/22/2018   GLUCOSE 47 (LL) 11/22/2018   CHOL 139 02/13/2018   TRIG 101.0 02/13/2018   HDL 29.40 (L) 02/13/2018   LDLDIRECT 136.8 12/12/2006   LDLCALC 90 02/13/2018   ALT 16 02/13/2018   AST 23 02/13/2018   NA 143 11/22/2018   K 4.0 11/22/2018   CL 102 11/22/2018   CREATININE 1.88 (H) 11/22/2018   BUN 30 (H) 11/22/2018   CO2 32 11/22/2018   TSH 2.84 06/01/2018   PSA 1.43 11/22/2018   HGBA1C 9.3 (A) 11/22/2018   MICROALBUR 1.0 06/01/2018    Dg Foot Complete Right  Result Date: 11/16/2018 CLINICAL DATA:  Nonhealing diabetic ulcer. EXAM: RIGHT FOOT COMPLETE - 3+ VIEW COMPARISON:  None. FINDINGS: No acute fracture or dislocation. No osseous destruction or periosteal reaction. Mild degenerative changes of the midfoot and toe IP joints. Bone mineralization is normal. Diffuse soft tissue swelling. No subcutaneous emphysema. IMPRESSION: 1. Diffuse soft tissue swelling.  No acute osseous abnormality. Electronically Signed   By: Titus Dubin M.D.   On: 11/16/2018 08:41    Assessment & Plan:   Dream was seen today for annual exam, anemia, hypertension and diabetes.  Diagnoses and all orders for this visit:  Preop cardiovascular exam- His EKG is normal but my estimation would be that he is a high  perioperative risk. -     EKG 12-Lead  Type 2 diabetes mellitus with complication, without long-term current use of insulin ([place])- His A1c is up to 9.3%.  His blood sugars are not adequately well controlled.  I have asked him to restart metformin and the SGLT2 inhibitor. -     POCT glycosylated hemoglobin (Hb A1C) -     HM Diabetes Foot Exam -     Basic metabolic panel; Future -     Consult to [place] Management -     Empagliflozin-metFORMIN HCl (SYNJARDY) 01-999 MG TABS; Take 1 tablet by mouth 2 (two) times daily.  Essential hypertension- His blood pressure is well controlled. -     Basic metabolic panel; Future  Type II diabetes mellitus with manifestations ([place]) -     Basic metabolic panel; Future -     Consult to [place] Management -     Empagliflozin-metFORMIN HCl (SYNJARDY) 01-999 MG TABS; Take 1 tablet by mouth 2 (two) times daily.  Benign prostatic hyperplasia without lower urinary tract symptoms- His PSA is normal which is reassuring that he does not have prostate cancer. -     PSA; Future  B12 deficiency- Will continue B12 replacement therapy. -     CBC with Differential/Platelet; Future -     Vitamin B12; Future -     Folate; Future  Other iron deficiency anemia- He remains anemic and his iron level is declining.  I recommended that he receive an iron infusion. -     CBC with Differential/Platelet; Future -     IBC panel; Future -     Ferritin; Future  Thiamine deficiency -     CBC with Differential/Platelet; Future -     Vitamin B1; Future  Morbid obesity ([place])- I do not think he is stable enough right now to have his LAP-BAND converted to a sleeve.  In the meantime he does agree to improve his lifestyle modifications.  Encounter for screening for HIV -  HIV Antibody (routine testing w rflx); Future  Vitamin D deficiency -     VITAMIN D 25 Hydroxy (Vit-D Deficiency, Fractures); Future  Need for pneumococcal vaccination -     Pneumococcal conjugate vaccine  13-valent  Other orders -     cycloSPORINE (RESTASIS) 0.05 % ophthalmic emulsion; Place 1 drop into both eyes 2 (two) times daily.   I have discontinued Bartolo Elderkin's vitamin E. I am also having him maintain his vitamin B-6, zinc gluconate, omeprazole, glucose blood, zolpidem, albuterol, potassium chloride SA, thiamine, Multiple Vitamins-Minerals (MULTIVITAMIN ADULT PO), aspirin EC, Vitamin A & D, cyanocobalamin, Cholecalciferol, polyethylene glycol powder, gabapentin, UltiCare Short Pen Needles, vitamin C, linaclotide, furosemide, levothyroxine, atorvastatin, Ventolin HFA, losartan, FeroSul, Levemir FlexTouch, HYDROcodone-acetaminophen, bisoprolol, cycloSPORINE, and Empagliflozin-metFORMIN HCl.  Meds ordered this encounter  Medications  . cycloSPORINE (RESTASIS) 0.05 % ophthalmic emulsion    Sig: Place 1 drop into both eyes 2 (two) times daily.    Dispense:  0.4 mL    Refill:  5  . Empagliflozin-metFORMIN HCl (SYNJARDY) 01-999 MG TABS    Sig: Take 1 tablet by mouth 2 (two) times daily.    Dispense:  180 tablet    Refill:  1   See AVS for instructions about healthy living and anticipatory guidance.  Follow-up: Return in about 3 months (around 02/22/2019).  Scarlette Calico, MD

## 2018-11-22 NOTE — Patient Outreach (Signed)
[facility]) Care Management  11/22/2018  Jiro Kiester 05-17-1956 [id number]   Referral Date: 11/22/2018 Referral Source: Md referral Referral Reason: Uncontrolled diabetes   Outreach Attempt: No answer. HIPAA compliant voice message left.     Plan: RN CM will attempt again within 4 business days and send letter   Jone Baseman, RN, MSN [facility] Care Management Coordinator Direct Line [phone] Toll Free: [phone]  Fax: [phone]

## 2018-11-23 ENCOUNTER — Encounter (HOSPITAL_BASED_OUTPATIENT_CLINIC_OR_DEPARTMENT_OTHER): Payer: Medicare HMO | Attending: Internal Medicine

## 2018-11-23 ENCOUNTER — Other Ambulatory Visit: Payer: Self-pay

## 2018-11-23 ENCOUNTER — Other Ambulatory Visit: Payer: Self-pay | Admitting: Internal Medicine

## 2018-11-23 ENCOUNTER — Other Ambulatory Visit (HOSPITAL_COMMUNITY)
Admission: RE | Admit: 2018-11-23 | Discharge: 2018-11-23 | Disposition: A | Discharge status: Home / Self Care | Payer: Medicare HMO | Source: Other Acute Inpatient Hospital | Attending: Internal Medicine | Admitting: Internal Medicine

## 2018-11-23 DIAGNOSIS — E1142 Type 2 diabetes mellitus with diabetic polyneuropathy: Secondary | ICD-10-CM | POA: Diagnosis not present

## 2018-11-23 DIAGNOSIS — L97513 Non-pressure chronic ulcer of other part of right foot with necrosis of muscle: Secondary | ICD-10-CM | POA: Insufficient documentation

## 2018-11-23 DIAGNOSIS — L97522 Non-pressure chronic ulcer of other part of left foot with fat layer exposed: Secondary | ICD-10-CM | POA: Diagnosis not present

## 2018-11-23 DIAGNOSIS — E11621 Type 2 diabetes mellitus with foot ulcer: Secondary | ICD-10-CM | POA: Insufficient documentation

## 2018-11-23 DIAGNOSIS — S91104A Unspecified open wound of right lesser toe(s) without damage to nail, initial encounter: Secondary | ICD-10-CM

## 2018-11-23 DIAGNOSIS — S91109A Unspecified open wound of unspecified toe(s) without damage to nail, initial encounter: Secondary | ICD-10-CM

## 2018-11-23 DIAGNOSIS — E118 Type 2 diabetes mellitus with unspecified complications: Secondary | ICD-10-CM

## 2018-11-23 DIAGNOSIS — B9561 Methicillin susceptible Staphylococcus aureus infection as the cause of diseases classified elsewhere: Secondary | ICD-10-CM | POA: Insufficient documentation

## 2018-11-23 DIAGNOSIS — L97512 Non-pressure chronic ulcer of other part of right foot with fat layer exposed: Secondary | ICD-10-CM | POA: Diagnosis not present

## 2018-11-23 NOTE — Telephone Encounter (Signed)
Pt is requesting a refill of Hydrocodone.

## 2018-11-23 NOTE — Patient Outreach (Signed)
[facility]) Care Management  11/23/2018  Charleston Hankin 09/12/1956 [id number]   Referral Date: 11/22/2018 Referral Source: Md referral Referral Reason: Uncontrolled diabetes   Outreach Attempt: No answer. HIPAA compliant voice message left.     Plan: RN CM will attempt again within 4 business days.  Jone Baseman, RN, MSN [place] Management Care Management Coordinator Direct Line [phone] Cell [phone] Toll Free: [phone]  Fax: [phone]

## 2018-11-23 NOTE — Telephone Encounter (Addendum)
I entered the [facility] referral . I tried to get in touch with [facility] but no luck.   I asked Cecille Rubin to call Alvis Lemmings to see if we can get someone out this weekend.   Cecille Rubin has [place] going to pt on Sunday. Pt informed of same.

## 2018-11-23 NOTE — Telephone Encounter (Signed)
Copied from [place] [phone]. Topic: Referral - Request for Referral >> Nov 23, 2018  2:23 PM Sheran Luz wrote: Has patient seen PCP for this complaint? Patient was advised to contact PCP for [facility] to get wounds dressed   Referral for which specialty: Home Health  Preferred provider/office: [place] (patient was unsure of name) # [phone] Reason for referral: Wounds on foot

## 2018-11-23 NOTE — Addendum Note (Signed)
Addended by: Karle Barr on: 11/23/2018 04:48 PM   Modules accepted: Orders

## 2018-11-25 DIAGNOSIS — E11621 Type 2 diabetes mellitus with foot ulcer: Secondary | ICD-10-CM | POA: Diagnosis not present

## 2018-11-25 DIAGNOSIS — L97511 Non-pressure chronic ulcer of other part of right foot limited to breakdown of skin: Secondary | ICD-10-CM | POA: Diagnosis not present

## 2018-11-25 DIAGNOSIS — Z794 Long term (current) use of insulin: Secondary | ICD-10-CM | POA: Diagnosis not present

## 2018-11-25 DIAGNOSIS — L97521 Non-pressure chronic ulcer of other part of left foot limited to breakdown of skin: Secondary | ICD-10-CM | POA: Diagnosis not present

## 2018-11-25 DIAGNOSIS — I110 Hypertensive heart disease with heart failure: Secondary | ICD-10-CM | POA: Diagnosis not present

## 2018-11-25 DIAGNOSIS — D509 Iron deficiency anemia, unspecified: Secondary | ICD-10-CM | POA: Diagnosis not present

## 2018-11-25 DIAGNOSIS — I5032 Chronic diastolic (congestive) heart failure: Secondary | ICD-10-CM | POA: Diagnosis not present

## 2018-11-25 DIAGNOSIS — L97513 Non-pressure chronic ulcer of other part of right foot with necrosis of muscle: Secondary | ICD-10-CM | POA: Diagnosis not present

## 2018-11-25 DIAGNOSIS — E1142 Type 2 diabetes mellitus with diabetic polyneuropathy: Secondary | ICD-10-CM | POA: Diagnosis not present

## 2018-11-25 MED ORDER — HYDROCODONE-ACETAMINOPHEN 10-325 MG PO TABS: ORAL_TABLET | ORAL | 0 refills | Status: DC

## 2018-11-25 NOTE — Assessment & Plan Note (Signed)

## 2018-11-26 ENCOUNTER — Telehealth: Payer: Self-pay | Admitting: Internal Medicine

## 2018-11-26 ENCOUNTER — Other Ambulatory Visit: Payer: Self-pay | Admitting: Internal Medicine

## 2018-11-26 DIAGNOSIS — E1121 Type 2 diabetes mellitus with diabetic nephropathy: Secondary | ICD-10-CM

## 2018-11-26 DIAGNOSIS — M159 Polyosteoarthritis, unspecified: Secondary | ICD-10-CM

## 2018-11-26 DIAGNOSIS — M160 Bilateral primary osteoarthritis of hip: Secondary | ICD-10-CM

## 2018-11-26 DIAGNOSIS — M150 Primary generalized (osteo)arthritis: Principal | ICD-10-CM

## 2018-11-26 DIAGNOSIS — M5416 Radiculopathy, lumbar region: Secondary | ICD-10-CM

## 2018-11-26 LAB — VITAMIN B1: Vitamin B1 (Thiamine): 8 nmol/L (ref 8–30)

## 2018-11-26 LAB — HIV ANTIBODY (ROUTINE TESTING W REFLEX): HIV 1&2 Ab, 4th Generation: NONREACTIVE

## 2018-11-26 NOTE — Telephone Encounter (Signed)
I got him set up with [facility] - and I would think he just needs to call them

## 2018-11-26 NOTE — Telephone Encounter (Signed)
[facility] called back and stated that they will be able to have the nurse come out on T and TH

## 2018-11-26 NOTE — Telephone Encounter (Signed)
Copied from [place] [phone]. Topic: Quick Communication - See Telephone Encounter >> Nov 26, 2018 12:07 PM Burchel, Abbi R wrote: CRM for notification. See Telephone encounter for: 11/26/18.  Pt requesting to change the days his Home Health nurse visit days from M/W/F to T/Th/Sat.  Please advise.   Pt: [phone]

## 2018-11-26 NOTE — Telephone Encounter (Signed)
error 

## 2018-11-26 NOTE — Telephone Encounter (Addendum)
Pt states [facility] does not have the  HYDROcodone-acetaminophen (NORCO) 10-325 MG tablet    Dated 11/25/2018  Pt would like to know if you will resend. Pt states he is in a lot of pain, having back spasms again.

## 2018-11-26 NOTE — Telephone Encounter (Signed)
[facility] - they stated that they have not received the rx for hydrocodone 10-325mg .  Can this be resent?

## 2018-11-26 NOTE — Telephone Encounter (Addendum)
Call to [facility] - They did not have pt listed in there system.   They informed that the [facility] agency was [facility] [phone].  Pt stated that he has care on MWF and he needs someone on TTH.   Called [facility] and are looking into what they can do.

## 2018-11-27 ENCOUNTER — Telehealth: Payer: Self-pay | Admitting: Internal Medicine

## 2018-11-27 ENCOUNTER — Other Ambulatory Visit: Payer: Self-pay

## 2018-11-27 ENCOUNTER — Telehealth: Payer: Self-pay

## 2018-11-27 DIAGNOSIS — L97513 Non-pressure chronic ulcer of other part of right foot with necrosis of muscle: Secondary | ICD-10-CM | POA: Diagnosis not present

## 2018-11-27 DIAGNOSIS — D509 Iron deficiency anemia, unspecified: Secondary | ICD-10-CM | POA: Diagnosis not present

## 2018-11-27 DIAGNOSIS — E118 Type 2 diabetes mellitus with unspecified complications: Secondary | ICD-10-CM

## 2018-11-27 DIAGNOSIS — E11621 Type 2 diabetes mellitus with foot ulcer: Secondary | ICD-10-CM | POA: Diagnosis not present

## 2018-11-27 DIAGNOSIS — Z794 Long term (current) use of insulin: Secondary | ICD-10-CM | POA: Diagnosis not present

## 2018-11-27 DIAGNOSIS — I5032 Chronic diastolic (congestive) heart failure: Secondary | ICD-10-CM | POA: Diagnosis not present

## 2018-11-27 DIAGNOSIS — E1142 Type 2 diabetes mellitus with diabetic polyneuropathy: Secondary | ICD-10-CM | POA: Diagnosis not present

## 2018-11-27 DIAGNOSIS — I110 Hypertensive heart disease with heart failure: Secondary | ICD-10-CM | POA: Diagnosis not present

## 2018-11-27 DIAGNOSIS — L97511 Non-pressure chronic ulcer of other part of right foot limited to breakdown of skin: Secondary | ICD-10-CM | POA: Diagnosis not present

## 2018-11-27 DIAGNOSIS — L97521 Non-pressure chronic ulcer of other part of left foot limited to breakdown of skin: Secondary | ICD-10-CM | POA: Diagnosis not present

## 2018-11-27 LAB — AEROBIC CULTURE W GRAM STAIN (SUPERFICIAL SPECIMEN): Gram Stain: NONE SEEN

## 2018-11-27 LAB — AEROBIC CULTURE  (SUPERFICIAL SPECIMEN)

## 2018-11-27 NOTE — Telephone Encounter (Signed)
Sent a community message to [facility] for their advise on whether or not the pt needs a new sleep study.

## 2018-11-27 NOTE — Telephone Encounter (Signed)
Message for [name]:   Darlina Guys  Cairrikier Dian [place], [place]!  Not an odd question at all! If the patient has had an office visit where CPAP usage and compliance has been documented and there has not been a break in therapy then we can use the old sleep study.   If there has been a break in therapy, then the pt would need to have a new study done.   Does this answer your question?  Thank you!  [name]

## 2018-11-27 NOTE — Telephone Encounter (Signed)
Copied from [place] [phone]. Topic: General - Other >> Nov 27, 2018  1:18 PM Antonieta Iba C wrote: Reason for CRM: pt called in to make Stefanie aware that his insurance will cover him getting a new blood sugar meter. Pt says that provider would just have to call [facility] to request one.

## 2018-11-27 NOTE — Telephone Encounter (Signed)
Copied from [place] [phone]. Topic: General - Other >> Nov 27, 2018 12:01 PM Lennox Solders wrote: Reason for CRM: pt is calling and he needs new order for cpap machine and supplies from [facility]. Pt last cpap was ordered 5 years ago from dr [name]. Please fax order to [facility]

## 2018-11-27 NOTE — Telephone Encounter (Signed)
Called and checked with [facility] and the do not dispense glucometers.  Contacted pt and requested that he call his rx insurance to find out what glucometer is covered.   Pt stated he would call me back and let me know.

## 2018-11-27 NOTE — Patient Outreach (Signed)
[place]) Care Management  11/27/2018  [name] 1956-03-16 [id number]   Referral Date:11/22/2018 Referral Source:Md referral Referral Reason:Uncontrolled diabetes   Outreach Attempt:spoke with patient.  He is able to verify HIPAA.  Discussed reason for referral.  Discussed [facility] services.  Patient agreeable to health coach for disease management of his diabetes.  Patient last A1c was 9.3. Patient not checking sugars at home as he needs a meter. He is working with his physician to get a new meter.  He states that he has to check with his insurance company to see if they cover the one he wants then the physician will write the order per patient.  Patient has a new foot ulcer and home health is involved.  Patient lives alone but is independent with all care.  Patient declines any other questions or concerns.    Plan: RN CM will refer to health coach for disease management and support of diabetes.  Jone Baseman, RN, MSN [place] Management Care Management Coordinator Direct Line [phone] Cell [phone] Toll Free: [phone]  Fax: [phone]

## 2018-11-28 NOTE — Telephone Encounter (Signed)
Appointment made

## 2018-11-28 NOTE — Telephone Encounter (Signed)
Will you call patient and let him know that he will need an appt for the CPAP order?   It has been five years and we need to evaluate the need for CPAP and also eval for need for another sleep study. Sleep study will be determined by pt compliance with using the CPAP machine.

## 2018-11-29 DIAGNOSIS — Z794 Long term (current) use of insulin: Secondary | ICD-10-CM | POA: Diagnosis not present

## 2018-11-29 DIAGNOSIS — L97521 Non-pressure chronic ulcer of other part of left foot limited to breakdown of skin: Secondary | ICD-10-CM | POA: Diagnosis not present

## 2018-11-29 DIAGNOSIS — E1142 Type 2 diabetes mellitus with diabetic polyneuropathy: Secondary | ICD-10-CM | POA: Diagnosis not present

## 2018-11-29 DIAGNOSIS — E11621 Type 2 diabetes mellitus with foot ulcer: Secondary | ICD-10-CM | POA: Diagnosis not present

## 2018-11-29 DIAGNOSIS — L97513 Non-pressure chronic ulcer of other part of right foot with necrosis of muscle: Secondary | ICD-10-CM | POA: Diagnosis not present

## 2018-11-29 DIAGNOSIS — D509 Iron deficiency anemia, unspecified: Secondary | ICD-10-CM | POA: Diagnosis not present

## 2018-11-29 DIAGNOSIS — I110 Hypertensive heart disease with heart failure: Secondary | ICD-10-CM | POA: Diagnosis not present

## 2018-11-29 DIAGNOSIS — L97511 Non-pressure chronic ulcer of other part of right foot limited to breakdown of skin: Secondary | ICD-10-CM | POA: Diagnosis not present

## 2018-11-29 DIAGNOSIS — I5032 Chronic diastolic (congestive) heart failure: Secondary | ICD-10-CM | POA: Diagnosis not present

## 2018-11-29 NOTE — Telephone Encounter (Signed)
Pt called to advise his insurance covers this type of meter  Continuous freestyle libre Pt needs the kit and supplies to go with it.  [place], [place] - [place] [phone] (Phone) [phone] (Fax)

## 2018-11-30 MED ORDER — FREESTYLE LIBRE 14 DAY READER DEVI: 1.0000 | 1 refills | Status: DC

## 2018-11-30 MED ORDER — FREESTYLE LIBRE 14 DAY SENSOR MISC: 1.0000 | 1 refills | Status: DC

## 2018-11-30 NOTE — Telephone Encounter (Signed)
erx sent

## 2018-12-04 DIAGNOSIS — E1142 Type 2 diabetes mellitus with diabetic polyneuropathy: Secondary | ICD-10-CM | POA: Diagnosis not present

## 2018-12-04 DIAGNOSIS — B9561 Methicillin susceptible Staphylococcus aureus infection as the cause of diseases classified elsewhere: Secondary | ICD-10-CM | POA: Diagnosis not present

## 2018-12-04 DIAGNOSIS — E11621 Type 2 diabetes mellitus with foot ulcer: Secondary | ICD-10-CM | POA: Diagnosis not present

## 2018-12-04 DIAGNOSIS — L97512 Non-pressure chronic ulcer of other part of right foot with fat layer exposed: Secondary | ICD-10-CM | POA: Diagnosis not present

## 2018-12-04 DIAGNOSIS — L97522 Non-pressure chronic ulcer of other part of left foot with fat layer exposed: Secondary | ICD-10-CM | POA: Diagnosis not present

## 2018-12-06 DIAGNOSIS — Z794 Long term (current) use of insulin: Secondary | ICD-10-CM | POA: Diagnosis not present

## 2018-12-06 DIAGNOSIS — D509 Iron deficiency anemia, unspecified: Secondary | ICD-10-CM | POA: Diagnosis not present

## 2018-12-06 DIAGNOSIS — E11621 Type 2 diabetes mellitus with foot ulcer: Secondary | ICD-10-CM | POA: Diagnosis not present

## 2018-12-06 DIAGNOSIS — E1142 Type 2 diabetes mellitus with diabetic polyneuropathy: Secondary | ICD-10-CM | POA: Diagnosis not present

## 2018-12-06 DIAGNOSIS — I5032 Chronic diastolic (congestive) heart failure: Secondary | ICD-10-CM | POA: Diagnosis not present

## 2018-12-06 DIAGNOSIS — L97513 Non-pressure chronic ulcer of other part of right foot with necrosis of muscle: Secondary | ICD-10-CM | POA: Diagnosis not present

## 2018-12-06 DIAGNOSIS — L97511 Non-pressure chronic ulcer of other part of right foot limited to breakdown of skin: Secondary | ICD-10-CM | POA: Diagnosis not present

## 2018-12-06 DIAGNOSIS — L97521 Non-pressure chronic ulcer of other part of left foot limited to breakdown of skin: Secondary | ICD-10-CM | POA: Diagnosis not present

## 2018-12-06 DIAGNOSIS — I110 Hypertensive heart disease with heart failure: Secondary | ICD-10-CM | POA: Diagnosis not present

## 2018-12-11 ENCOUNTER — Other Ambulatory Visit: Payer: Self-pay | Admitting: Internal Medicine

## 2018-12-11 DIAGNOSIS — L97521 Non-pressure chronic ulcer of other part of left foot limited to breakdown of skin: Secondary | ICD-10-CM | POA: Diagnosis not present

## 2018-12-11 DIAGNOSIS — D509 Iron deficiency anemia, unspecified: Secondary | ICD-10-CM | POA: Diagnosis not present

## 2018-12-11 DIAGNOSIS — E11621 Type 2 diabetes mellitus with foot ulcer: Secondary | ICD-10-CM | POA: Diagnosis not present

## 2018-12-11 DIAGNOSIS — L97511 Non-pressure chronic ulcer of other part of right foot limited to breakdown of skin: Secondary | ICD-10-CM | POA: Diagnosis not present

## 2018-12-11 DIAGNOSIS — I5032 Chronic diastolic (congestive) heart failure: Secondary | ICD-10-CM | POA: Diagnosis not present

## 2018-12-11 DIAGNOSIS — E118 Type 2 diabetes mellitus with unspecified complications: Secondary | ICD-10-CM

## 2018-12-11 DIAGNOSIS — I110 Hypertensive heart disease with heart failure: Secondary | ICD-10-CM | POA: Diagnosis not present

## 2018-12-11 DIAGNOSIS — L97513 Non-pressure chronic ulcer of other part of right foot with necrosis of muscle: Secondary | ICD-10-CM | POA: Diagnosis not present

## 2018-12-11 DIAGNOSIS — Z794 Long term (current) use of insulin: Secondary | ICD-10-CM | POA: Diagnosis not present

## 2018-12-11 DIAGNOSIS — E1142 Type 2 diabetes mellitus with diabetic polyneuropathy: Secondary | ICD-10-CM | POA: Diagnosis not present

## 2018-12-12 ENCOUNTER — Telehealth: Payer: Self-pay

## 2018-12-12 NOTE — Telephone Encounter (Signed)
Copied from [place] [phone]. Topic: General - Other >> Dec 11, 2018  2:18 PM [name] wrote:  Reason for CRM:  pt called in left message , call back back.  State the home health nurse told him that his urine was [name] little dark or had [name] little blood in the urine.  He stated that the home health nurse needed orders to check his urine when she come in tomorrow morning.  He he was unsure of nurse name or compy.    Pt also wanted to know the status of this sugar meter?   He also wanted to know the status on this diabetic shoes ?

## 2018-12-12 NOTE — Telephone Encounter (Signed)
Tried to call number for pt. Line was busy.

## 2018-12-12 NOTE — Telephone Encounter (Signed)
Yes, I am ok with this 

## 2018-12-12 NOTE — Telephone Encounter (Signed)
Are you okay with the home health nurse checking patients urine due to dark color?

## 2018-12-13 DIAGNOSIS — E1142 Type 2 diabetes mellitus with diabetic polyneuropathy: Secondary | ICD-10-CM | POA: Diagnosis not present

## 2018-12-13 DIAGNOSIS — I110 Hypertensive heart disease with heart failure: Secondary | ICD-10-CM | POA: Diagnosis not present

## 2018-12-13 DIAGNOSIS — D509 Iron deficiency anemia, unspecified: Secondary | ICD-10-CM | POA: Diagnosis not present

## 2018-12-13 DIAGNOSIS — L97511 Non-pressure chronic ulcer of other part of right foot limited to breakdown of skin: Secondary | ICD-10-CM | POA: Diagnosis not present

## 2018-12-13 DIAGNOSIS — Z794 Long term (current) use of insulin: Secondary | ICD-10-CM | POA: Diagnosis not present

## 2018-12-13 DIAGNOSIS — E11621 Type 2 diabetes mellitus with foot ulcer: Secondary | ICD-10-CM | POA: Diagnosis not present

## 2018-12-13 DIAGNOSIS — L97521 Non-pressure chronic ulcer of other part of left foot limited to breakdown of skin: Secondary | ICD-10-CM | POA: Diagnosis not present

## 2018-12-13 DIAGNOSIS — I5032 Chronic diastolic (congestive) heart failure: Secondary | ICD-10-CM | POA: Diagnosis not present

## 2018-12-13 DIAGNOSIS — L97513 Non-pressure chronic ulcer of other part of right foot with necrosis of muscle: Secondary | ICD-10-CM | POA: Diagnosis not present

## 2018-12-14 DIAGNOSIS — E1142 Type 2 diabetes mellitus with diabetic polyneuropathy: Secondary | ICD-10-CM | POA: Diagnosis not present

## 2018-12-14 DIAGNOSIS — L97522 Non-pressure chronic ulcer of other part of left foot with fat layer exposed: Secondary | ICD-10-CM | POA: Diagnosis not present

## 2018-12-14 DIAGNOSIS — E11621 Type 2 diabetes mellitus with foot ulcer: Secondary | ICD-10-CM | POA: Diagnosis not present

## 2018-12-14 DIAGNOSIS — L97512 Non-pressure chronic ulcer of other part of right foot with fat layer exposed: Secondary | ICD-10-CM | POA: Diagnosis not present

## 2018-12-14 DIAGNOSIS — B9561 Methicillin susceptible Staphylococcus aureus infection as the cause of diseases classified elsewhere: Secondary | ICD-10-CM | POA: Diagnosis not present

## 2018-12-17 ENCOUNTER — Other Ambulatory Visit (HOSPITAL_COMMUNITY): Payer: Self-pay | Admitting: Cardiology

## 2018-12-17 DIAGNOSIS — R609 Edema, unspecified: Secondary | ICD-10-CM | POA: Diagnosis not present

## 2018-12-17 DIAGNOSIS — E1140 Type 2 diabetes mellitus with diabetic neuropathy, unspecified: Secondary | ICD-10-CM | POA: Diagnosis not present

## 2018-12-17 DIAGNOSIS — L98491 Non-pressure chronic ulcer of skin of other sites limited to breakdown of skin: Secondary | ICD-10-CM | POA: Diagnosis not present

## 2018-12-17 DIAGNOSIS — M150 Primary generalized (osteo)arthritis: Secondary | ICD-10-CM | POA: Diagnosis not present

## 2018-12-18 ENCOUNTER — Ambulatory Visit (INDEPENDENT_AMBULATORY_CARE_PROVIDER_SITE_OTHER): Payer: Medicare HMO | Admitting: Internal Medicine

## 2018-12-18 ENCOUNTER — Ambulatory Visit: Payer: Medicare HMO | Admitting: Internal Medicine

## 2018-12-18 ENCOUNTER — Ambulatory Visit: Payer: Self-pay

## 2018-12-18 ENCOUNTER — Encounter: Payer: Self-pay | Admitting: Internal Medicine

## 2018-12-18 DIAGNOSIS — I110 Hypertensive heart disease with heart failure: Secondary | ICD-10-CM | POA: Diagnosis not present

## 2018-12-18 DIAGNOSIS — K5904 Chronic idiopathic constipation: Secondary | ICD-10-CM

## 2018-12-18 DIAGNOSIS — E1142 Type 2 diabetes mellitus with diabetic polyneuropathy: Secondary | ICD-10-CM | POA: Diagnosis not present

## 2018-12-18 DIAGNOSIS — G4733 Obstructive sleep apnea (adult) (pediatric): Secondary | ICD-10-CM | POA: Diagnosis not present

## 2018-12-18 DIAGNOSIS — D509 Iron deficiency anemia, unspecified: Secondary | ICD-10-CM | POA: Diagnosis not present

## 2018-12-18 DIAGNOSIS — L97521 Non-pressure chronic ulcer of other part of left foot limited to breakdown of skin: Secondary | ICD-10-CM | POA: Diagnosis not present

## 2018-12-18 DIAGNOSIS — Z794 Long term (current) use of insulin: Secondary | ICD-10-CM | POA: Diagnosis not present

## 2018-12-18 DIAGNOSIS — L97513 Non-pressure chronic ulcer of other part of right foot with necrosis of muscle: Secondary | ICD-10-CM | POA: Diagnosis not present

## 2018-12-18 DIAGNOSIS — E11621 Type 2 diabetes mellitus with foot ulcer: Secondary | ICD-10-CM | POA: Diagnosis not present

## 2018-12-18 DIAGNOSIS — L97511 Non-pressure chronic ulcer of other part of right foot limited to breakdown of skin: Secondary | ICD-10-CM | POA: Diagnosis not present

## 2018-12-18 DIAGNOSIS — I5032 Chronic diastolic (congestive) heart failure: Secondary | ICD-10-CM | POA: Diagnosis not present

## 2018-12-18 MED ORDER — LINACLOTIDE 290 MCG PO CAPS: 290.0000 ug | ORAL_CAPSULE | Freq: Every day | ORAL | 1 refills | Status: DC

## 2018-12-18 NOTE — Telephone Encounter (Signed)
Message from Rayann Heman sent at 12/18/2018 8:46 AM EDT   Summary: Constipated    Pt called and left a voicemail that stated that he is very constipated and would like a call back from the nurse. Please advise          Contacted pt regarding his symptoms; he states that he was having constipation but he took a laxative, and it is no longer a concern for him; he is also concerned about his appointment for his sleep study scheduled with Dr [name] for 12/18/2018; he would like to have a virtual visit; he can be contacted at [email or web address] or [phone] and a message can be left on the voice; he verbalizes understanding; will route to office for notification and final disposition.

## 2018-12-18 NOTE — Progress Notes (Signed)
Virtual Visit via Video Note  I connected with [name] on 12/18/18 at  2:30 PM EDT by a video enabled telemedicine application and verified that I am speaking with the correct person using two identifiers.   I discussed the limitations of evaluation and management by telemedicine and the availability of in person appointments. The patient expressed understanding and agreed to proceed.  History of Present Illness: He chose to do a virtual visit today.  He complains of constipation and says he is using a laxative to help him have a bowel movement but even then the bowels are infrequent and hard.  He was given a prescription for Linzess about 6 months ago but it sounds like he is no longer taking it.  He is not sure why.  He also complains of a remote history of obstructive sleep apnea.  He tells me that when he lived in [place] he was diagnosed with OSA and he used a CPAP.  He has not had any follow-up since he moved to [place] years ago.  He complains of choking during his sleep with known snoring and apnea.    Observations/Objective: During the video visit he was in no acute distress.  He was calm, alert, and appropriate.   Assessment and Plan: He suffers from chronic idiopathic constipation.  I have asked him to restart the Linzess.  He also has a history of sleep apnea that is not currently being treated.  I have referred him to a sleep specialist. I think he will have to undergo another sleep study and will need new equipment for the treatment of OSA.   Follow Up Instructions: He agrees to proceed with a sleep evaluation.  He agrees to restart Linzess.  He will let me know if he develops any new or worsening symptoms.    I discussed the assessment and treatment plan with the patient. The patient was provided an opportunity to ask questions and all were answered. The patient agreed with the plan and demonstrated an understanding of the instructions.   The patient was advised to call  back or seek an in-person evaluation if the symptoms worsen or if the condition fails to improve as anticipated.  I provided 20 minutes of non-face-to-face time during this encounter.   Scarlette Calico, MD

## 2018-12-19 ENCOUNTER — Encounter: Payer: Self-pay | Admitting: Neurology

## 2018-12-19 ENCOUNTER — Telehealth: Payer: Self-pay | Admitting: Neurology

## 2018-12-19 NOTE — Telephone Encounter (Signed)
Due to current COVID 19 pandemic, our office is severely reducing in office visits for at least the next 2 weeks, in order to minimize the risk to our patients and healthcare providers. Our staff will contact you for next steps. Pt understands that although there may be some limitations with this type of visit, we will take all precautions to reduce any security or privacy concerns.  Pt understands that this will be treated like an in office visit and we will file with pt's insurance, and there may be a patient responsible charge related to this service. Pt confirmed current e-mail address is [email or web address]

## 2018-12-19 NOTE — Telephone Encounter (Signed)
Called the patient and made sure his chart was updated and pharmacy on file was correct. Pt verbalized understanding of making sure the app was downloaded and will watch for the email which has been sent. Patient verbalized being ready for the meeting prior to the time of the scheduled apt on Monday. Advised in the email to complete the sleepy scale as well as measuring his neck circumference prior to the apt.   Pt states that he was on CPAP until his machine broke about 6-8 months ago. Patient was established with [facility]. His sleep study was 20 yrs ago in [place].

## 2018-12-20 DIAGNOSIS — D509 Iron deficiency anemia, unspecified: Secondary | ICD-10-CM | POA: Diagnosis not present

## 2018-12-20 DIAGNOSIS — L97511 Non-pressure chronic ulcer of other part of right foot limited to breakdown of skin: Secondary | ICD-10-CM | POA: Diagnosis not present

## 2018-12-20 DIAGNOSIS — Z794 Long term (current) use of insulin: Secondary | ICD-10-CM | POA: Diagnosis not present

## 2018-12-20 DIAGNOSIS — L97521 Non-pressure chronic ulcer of other part of left foot limited to breakdown of skin: Secondary | ICD-10-CM | POA: Diagnosis not present

## 2018-12-20 DIAGNOSIS — I110 Hypertensive heart disease with heart failure: Secondary | ICD-10-CM | POA: Diagnosis not present

## 2018-12-20 DIAGNOSIS — E1142 Type 2 diabetes mellitus with diabetic polyneuropathy: Secondary | ICD-10-CM | POA: Diagnosis not present

## 2018-12-20 DIAGNOSIS — I5032 Chronic diastolic (congestive) heart failure: Secondary | ICD-10-CM | POA: Diagnosis not present

## 2018-12-20 DIAGNOSIS — L97513 Non-pressure chronic ulcer of other part of right foot with necrosis of muscle: Secondary | ICD-10-CM | POA: Diagnosis not present

## 2018-12-20 DIAGNOSIS — E11621 Type 2 diabetes mellitus with foot ulcer: Secondary | ICD-10-CM | POA: Diagnosis not present

## 2018-12-24 ENCOUNTER — Encounter: Payer: Self-pay | Admitting: Neurology

## 2018-12-24 ENCOUNTER — Ambulatory Visit (INDEPENDENT_AMBULATORY_CARE_PROVIDER_SITE_OTHER): Payer: Medicare HMO | Admitting: Neurology

## 2018-12-24 ENCOUNTER — Other Ambulatory Visit: Payer: Self-pay

## 2018-12-24 DIAGNOSIS — I5032 Chronic diastolic (congestive) heart failure: Secondary | ICD-10-CM | POA: Diagnosis not present

## 2018-12-24 DIAGNOSIS — G4733 Obstructive sleep apnea (adult) (pediatric): Secondary | ICD-10-CM

## 2018-12-24 DIAGNOSIS — Z9884 Bariatric surgery status: Secondary | ICD-10-CM

## 2018-12-24 DIAGNOSIS — E662 Morbid (severe) obesity with alveolar hypoventilation: Secondary | ICD-10-CM

## 2018-12-24 DIAGNOSIS — E118 Type 2 diabetes mellitus with unspecified complications: Secondary | ICD-10-CM

## 2018-12-24 DIAGNOSIS — Z6841 Body Mass Index (BMI) 40.0 and over, adult: Secondary | ICD-10-CM

## 2018-12-24 DIAGNOSIS — E1142 Type 2 diabetes mellitus with diabetic polyneuropathy: Secondary | ICD-10-CM

## 2018-12-24 DIAGNOSIS — G4719 Other hypersomnia: Secondary | ICD-10-CM

## 2018-12-24 DIAGNOSIS — E785 Hyperlipidemia, unspecified: Secondary | ICD-10-CM

## 2018-12-24 DIAGNOSIS — R0601 Orthopnea: Secondary | ICD-10-CM

## 2018-12-24 DIAGNOSIS — E1121 Type 2 diabetes mellitus with diabetic nephropathy: Secondary | ICD-10-CM

## 2018-12-24 NOTE — Progress Notes (Signed)
SLEEP MEDICINE CLINIC   Provider:  Larey Seat, MD    Primary Care Physician:  [name], MD   Referring Provider: [name], MD   Virtual Visit via Video Note  I connected with [name] on 12/24/18 at 10:00 AM EDT by a video enabled telemedicine application and verified that I am speaking with the correct person using two identifiers. I discussed the limitations of evaluation and management by telemedicine and the availability of in person appointments. The patient expressed understanding and agreed to proceed.  Larey Seat, MD   HPI:  [name] is a 63 y.o. male patient and was seen by video- referral for OSA work up through [name] .  [name] was seen previously by Dr. Scarlette Calico also in the video connection on 18 December 2018.  The patient is super obese, and struggles with significant mobility issues, continued weight gain, social isolation at this time, his only regular exercise had been swimming and the pool is closed due to the coronavirus crisis. The patient states that he lived in [place] when he was originally diagnosed with obstructive sleep apnea approximately 13 years ago he has always used the same machine since initial diagnosis which broke about a year ago.  He has a comorbidities of diabetes mellitus, congestive heart failure chronic kidney disease, hypertension, superobesity, currently untreated obstructive sleep apnea, and some chronic pain issues.  He reports orthopnea and obesity hypoventilation.  Chief complaint according to patient : I just can't stay asleep- and I am very, very sleepy".   Sleep habits are as follows: When asking [name] about his sleep habits he was somewhat evasive.  He states that since he cannot sleep through he naps on and off all day.  His dinnertime may vary between 6 and 7 PM and he may go to bed very early but he does not sleep usually before 4 AM.  He wakes up choking he needs to sleep  either in a seated position propped up in bed or in a recliner otherwise he will choke snore and startle himself awake.  The problem is not to initiate sleep or to stay asleep.  He also reports that he dreams intrusive dreams sometimes almost hallucinations even in daytime after a short nap he has woken up with visions some of them frightening.  He also states that his days and nights flow together that overall there may be less than 4 hours of sleep.  There is certainly also sleep hygiene to address as he states he is watching TV on and off in daytime and at night. He never feels refreshed, restored, and the last time he has felt this way would have been while on CPAP.   Sleep medical history : [name] stated that he had been disabled and not return to work after a fall this seems to have been more than 2 decades ago and he is using an ambulation walker he reports having had bilateral hip recent repairs or surgeries, he has decreased range of motion when ambulating, he has lower extremity edema diabetes, hypertension, but now super obesity with hypoventilation and orthopnea, and he is status post bariatric surgery according to his medical notes but I did not inquire about this.  Worrisome is the chronic kidney disease and the diagnosis of congestive heart failure.    Social history: single,  lives alone, sleeps alone. He  has not been gainfully employed in over a decade.  Denies ETOH, Tobacco use.  Has coffee 2 cups in AM, no caffeinated sodas or teas.  Regular swimmer and church attendee.    Review of Systems: Out of a complete 14 system review, the patient complains of only the following symptoms, and all other reviewed systems are negative. How likely are you to doze in the following situations: 0 = not likely, 1 = slight chance, 2 = moderate chance, 3 = high chance  Sitting and Reading? Watching Television? Sitting inactive in a public place (theater or meeting)? Lying down in the  afternoon when circumstances permit? Sitting and talking to someone? Sitting quietly after lunch without alcohol? In a car, while stopped for a few minutes in traffic? As a passenger in a car for an hour without a break?  Total =  The patient endorsed 16 out of 24 points and states that at times it may be as high as 23 points.  Social History   Socioeconomic History  . Marital status: Legally Separated    Spouse name: Not on file  . Number of children: 0  . Years of education: Not on file  . Highest education level: Not on file  Occupational History  . Occupation: Engineering geologist  . Financial resource strain: Not on file  . Food insecurity:    Worry: Not on file    Inability: Not on file  . Transportation needs:    Medical: Not on file    Non-medical: Not on file  Tobacco Use  . Smoking status: Never Smoker  . Smokeless tobacco: Never Used  Substance and Sexual Activity  . Alcohol use: Not Currently    Alcohol/week: 0.0 standard drinks    Comment: states he does not drink   . Drug use: No  . Sexual activity: Not Currently  Lifestyle  . Physical activity:    Days per week: Not on file    Minutes per session: Not on file  . Stress: Not on file  Relationships  . Social connections:    Talks on phone: Not on file    Gets together: Not on file    Attends religious service: Not on file    Active member of club or organization: Not on file    Attends meetings of clubs or organizations: Not on file    Relationship status: Not on file  . Intimate partner violence:    Fear of current or ex partner: Not on file    Emotionally abused: Not on file    Physically abused: Not on file    Forced sexual activity: Not on file  Other Topics Concern  . 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   In new relationship (04/2009) - girlfriend helps taste for cooking business and helps monitor for ulcers.   Regular Exercise- No       Family History  Problem Relation Age of Onset  . Hepatitis Mother        hepatitis C  . Diabetes Mother   . Congestive Heart Failure Mother   . CAD Mother   . Heart attack Brother 51  . Diabetes Father   . Heart failure Brother   . Stomach cancer Neg Hx   .  Colon cancer Neg Hx     Past Medical History:  Diagnosis Date  . Anemia    iron  . CHF (congestive heart failure) (HCC)    Presumed diastolic. Echo (06/07) w.EF 45%, severe posterior HK, mild LV hypertrophy, No further work-up of abnormal echo was done. pt denies CHF  . Colon cancer ([place]) 2007   s/p sigmoid colectomy  . Diabetes mellitus    Has Hx of diabetic foot ulcer & peripheral neuropathy  type2  . Dyspnea    w/ activity  . History of PFTs 05/2009   Mild Obstructive defect  . HTN (hypertension)   . Hyperlipidemia   . Morbid obesity ([place])   . OSA on CPAP   . Pneumonia    week ago     Past Surgical History:  Procedure Laterality Date  . BARIATRIC SURGERY  12/2009   Lap Band/At [facility]  . COLONOSCOPY  multiple  . COLONOSCOPY WITH PROPOFOL N/A 11/26/2012   Procedure: COLONOSCOPY WITH PROPOFOL;  Surgeon: Gatha Mayer, MD;  Location: [facility];  Service: Endoscopy;  Laterality: N/A;  may need pre appt. with anesthesia due to morbid obesity  . COLONOSCOPY WITH PROPOFOL N/A 07/11/2017   Procedure: COLONOSCOPY WITH PROPOFOL;  Surgeon: Gatha Mayer, MD;  Location: [facility];  Service: Endoscopy;  Laterality: N/A;  . [place]   '80/Right  '84/Left  . EYE SURGERY Right 05/2017  . HERNIA REPAIR    . HIP SURGERY     bilateral  . INCISIONAL HERNIA REPAIR N/A 12/18/2013   Procedure:  REPAIR OF INCARCERATED INCISIONAL HERNIA;  Surgeon: Rolm Bookbinder, MD;  Location: [facility];  Service: General;  Laterality:  N/A;  . INSERTION OF MESH N/A 12/18/2013   Procedure: INSERTION OF MESH;  Surgeon: Rolm Bookbinder, MD;  Location: [facility];  Service: General;  Laterality: N/A;  . Sigmoid Colectomy  10/2005   [facility]  . TONSILLECTOMY      Current Outpatient Medications  Medication Sig Dispense Refill  . albuterol (PROVENTIL HFA;VENTOLIN HFA) 108 (90 Base) MCG/ACT inhaler Inhale 2 puffs into the lungs every 6 (six) hours as needed for wheezing or shortness of breath. 1 Inhaler 1  . aspirin EC 81 MG tablet     . atorvastatin (LIPITOR) 40 MG tablet TAKE 1 TABLET BY MOUTH DAILY. 90 tablet 1  . bisoprolol (ZEBETA) 10 MG tablet TAKE 1 TABLET (10 MG TOTAL) BY MOUTH EVERY EVENING. (Patient taking differently: Take 40 mg by mouth daily. ) 90 tablet 1  . Cholecalciferol 2000 units TABS Take 1 tablet (2,000 Units total) by mouth daily. 90 tablet 1  . Continuous Blood Gluc Receiver (FREESTYLE LIBRE 14 DAY READER) DEVI 1 each by Does not apply route every 14 (fourteen) days. 6 Device 1  . Continuous Blood Gluc Sensor (FREESTYLE LIBRE 14 DAY SENSOR) MISC 1 each by Does not apply route every 14 (fourteen) days. 6 each 1  . cyanocobalamin 2000 MCG tablet Take 1 tablet (2,000 mcg total) by mouth daily. 90 tablet 1  . cycloSPORINE (RESTASIS) 0.05 % ophthalmic emulsion Place 1 drop into both eyes 2 (two) times daily. (Patient taking differently: Place 1 drop into both eyes daily. ) 0.4 mL 5  . FEROSUL 325 (65 Fe) MG tablet TAKE 1 TABLET BY MOUTH 2 TIMES DAILY WITH A MEAL. (Patient taking differently: Take 325 mg by mouth 3 (three) times daily with meals. ) 180 tablet 1  . furosemide (LASIX) 80 MG tablet TAKE 1  TABLET BY MOUTH 2 TIMES DAILY. 60 tablet 5  . gabapentin (NEURONTIN) 100 MG capsule Take 1 capsule (100 mg total) by mouth 3 (three) times daily. (Patient taking differently: Take 300 mg by mouth at bedtime. ) 90 capsule 5  . glucose blood (ONETOUCH VERIO) test strip USE  STRIP TO CHECK GLUCOSE THREE TIMES DAILY 100 each 5   . HYDROcodone-acetaminophen (NORCO) 10-325 MG tablet TAKE 1 TABLET BY MOUTH EVERY 8 HOURS AS NEEDED. 30 tablet 0  . LEVEMIR FLEXTOUCH 100 UNIT/ML Pen INJECT 240 UNITS INTO THE SKIN EVERY MORNING. 75 mL 5  . levothyroxine (SYNTHROID, LEVOTHROID) 50 MCG tablet TAKE 1 TABLET BY MOUTH DAILY BEFORE BREAKFAST. 90 tablet 0  . linaclotide (LINZESS) 290 MCG CAPS capsule Take 1 capsule (290 mcg total) by mouth daily before breakfast. 90 capsule 1  . losartan (COZAAR) 25 MG tablet Take 2 tablets (50 mg total) by mouth daily. NEED FOLLOW UP APPT WITH DR [facility] [phone] 180 tablet 0  . Multiple Vitamins-Minerals (MULTIVITAMIN ADULT PO) Take by mouth.    [place] Kitchen omeprazole (PRILOSEC) 40 MG capsule Take 1 tablet by mouth 30 min before breakfast. 90 capsule 3  . potassium chloride SA (K-DUR,KLOR-CON) 20 MEQ tablet TAKE 1 TABLET BY MOUTH DAILY. 30 tablet 3  . SYNJARDY 01-999 MG TABS TAKE 1 TABLET BY MOUTH 2 (TWO) TIMES DAILY. 180 tablet 1  . thiamine (VITAMIN B-1) 100 MG tablet TAKE 1 TABLET BY MOUTH DAILY. 90 tablet 1  . ULTICARE SHORT PEN NEEDLES 31G X 8 MM MISC USE WITH INSULIN PENS DAILY. 100 each 3  . VENTOLIN HFA 108 (90 Base) MCG/ACT inhaler INHALE 2 PUFFS INTO THE LUNGS EVERY 6 HOURS AS NEEDED FOR WHEEZING OR SHORTNESS OF BREATH. 18 g 2  . vitamin B-6 (PYRIDOXINE) 25 MG tablet Take 1 tablet (25 mg total) by mouth daily. 90 tablet 1  . vitamin C (ASCORBIC ACID) 500 MG tablet Take 2 tablets (1,000 mg total) by mouth 2 (two) times daily. Reported on 01/19/2016 180 tablet 1  . Vitamins A & D (VITAMIN A & D) 10000-400 units TABS     . zinc gluconate 50 MG tablet Take 1 tablet (50 mg total) by mouth daily. Reported on 01/19/2016 90 tablet 1  . zolpidem (AMBIEN) 10 MG tablet Take 1 tablet (10 mg total) by mouth at bedtime as needed for sleep. 30 tablet 0   No current facility-administered medications for this visit.     Allergies as of 12/24/2018 - Review Complete 12/19/2018  Allergen Reaction Noted  . Shellfish  allergy Anaphylaxis 12/15/2011  . Lisinopril Cough 03/24/2014    Vitals: There were no vitals taken for this visit. Last Weight:  Wt Readings from Last 1 Encounters:  11/22/18 (!) 465 lb (210.9 kg)   ONG:EXBMW is no height or weight on file to calculate BMI.     Last Height:   Ht Readings from Last 1 Encounters:  11/22/18 6' (1.829 m)    Physical exam:  General: The patient is awake, alert and appears not in acute distress. The patient is well groomed. Head: Normocephalic, atraumatic. Neck is supple. Mallampati: 5 neck circumference:21 "  Nasal airflow patent ,Retrognathia is seen.  Respiratory: respiratory rate 16/min. Skin:   Edema in both lower extremities. Trunk: BMI is over 50   Neurologic exam : The patient is awake and alert, oriented to place and time.   Memory subjective described as intact.  MOCA:No flowsheet data found.   Attention span & concentration  ability appears normal.  Speech is fluent,  Without dysarthria, dysphonia or aphasia.  Mood and affect are appropriate.  Cranial nerves: Pupils are equal and briskly reactive to light. He is status post cornea transplant and has cloudy lay over  The top of there right iris and affecting pupil, too. Extraocular movements  in vertical and horizontal planes intact and without nystagmus. Visual fields by finger perimetry are intact. Hearing appears intact.  Facial motor strength is symmetric and tongue moves midline. Shoulder shrug was symmetrical.   Motor exam:   Normal muscle bulk and symmetric ROM demonstrated in upper extremities.  Coordination: Finger-and hand maneuver without evidence of ataxia, dysmetria or tremor.  Gait and station: deferred.   After review of laboratory studies,  Personal review of imaging studies, reports of other /same  Imaging studies, results of polysomnography and / or neurophysiology testing and pre-existing records as far as provided in visit., my summary is:   Assessment and Plan:  former CPAP user- OSA was diagnosed while living in [place]-  This patient has a history  CPAP use for 12 years, machine broke about 12 month ago.  Sleep deprived. Orthopnea. Super-obesity at over 400 pounds, 6 ' hight. DM and related nocturia.  Fragmented sleep sleep with obesity hypoventilation, EDS.   Follow Up Instructions:  There is certainly concern given the patient's extensive past medical history about the reliability of a home sleep test, but at this time this is the only test possibility I can provide for him.  I also feel comforted that he used to use CPAP looking at his medical history he may well have complex sleep apnea, central sleep apnea and obesity hypoventilation all conditions that would make the use of a BiPAP more useful for him and possibly more safe.  I will see if the watch pat home study allows me enough of a differentiation between different apnea types, the patient has orthopnea and will spend his sleep time seated.  After the data are available I announced that I would either order an auto titration CPAP or auto titration BiPAP depending on the character and severity of his sleep apnea.    I also stated that his obesity is certainly is his main risk factor and not being aware that he had already bariatric surgery at one time I would want him to see a medical weight loss program and perhaps even counseling if an eating disorder is prevalent.   HST - this is the best I can do in [place] times. We will mail a HST to the patient and ask him to wait for a phone call confirming that the data are available and interpreted before discarding the device.   Referral to medical weight management  Return to swimming exercises as soon as possible.    I discussed the assessment and treatment plan with the patient. The patient was provided an opportunity to ask questions and all were answered. The patient agreed with the plan and demonstrated an understanding of the instructions.    The patient was advised to call back or seek an in-person evaluation if the symptoms worsen or if the condition fails to improve as anticipated.  I provided 30 minutes of non-face-to-face video time during this encounter.  A face to face visit in 3-4 month will be scheduled.   Medicare pre-authorization needed.   Larey Seat, MD 10/22/7626, 31:51 AM  Certified in Neurology by ABPN Certified in Sleep Medicine by [facility] [address]  [place], [place] [address]     Cc Tom Jones

## 2018-12-25 ENCOUNTER — Other Ambulatory Visit: Payer: Self-pay

## 2018-12-25 DIAGNOSIS — D509 Iron deficiency anemia, unspecified: Secondary | ICD-10-CM | POA: Diagnosis not present

## 2018-12-25 DIAGNOSIS — E1142 Type 2 diabetes mellitus with diabetic polyneuropathy: Secondary | ICD-10-CM | POA: Diagnosis not present

## 2018-12-25 DIAGNOSIS — L97511 Non-pressure chronic ulcer of other part of right foot limited to breakdown of skin: Secondary | ICD-10-CM | POA: Diagnosis not present

## 2018-12-25 DIAGNOSIS — Z794 Long term (current) use of insulin: Secondary | ICD-10-CM | POA: Diagnosis not present

## 2018-12-25 DIAGNOSIS — I5032 Chronic diastolic (congestive) heart failure: Secondary | ICD-10-CM | POA: Diagnosis not present

## 2018-12-25 DIAGNOSIS — E11621 Type 2 diabetes mellitus with foot ulcer: Secondary | ICD-10-CM | POA: Diagnosis not present

## 2018-12-25 DIAGNOSIS — I110 Hypertensive heart disease with heart failure: Secondary | ICD-10-CM | POA: Diagnosis not present

## 2018-12-25 DIAGNOSIS — L97521 Non-pressure chronic ulcer of other part of left foot limited to breakdown of skin: Secondary | ICD-10-CM | POA: Diagnosis not present

## 2018-12-25 DIAGNOSIS — L97513 Non-pressure chronic ulcer of other part of right foot with necrosis of muscle: Secondary | ICD-10-CM | POA: Diagnosis not present

## 2018-12-25 NOTE — Patient Outreach (Signed)
[facility]) Care Management  12/25/2018  Mozell Hardacre 12-30-1955 [id number]    1st outreach attempt to the patient for initial assessment.  The patient answered the phone and stated that he was unable to talk because he had just woke up and would like me to call at another time.  Plan: RN Health Coach will make outreach attempt to the patient within thirty business days.   Lazaro Arms RN, BSN, [place] Direct Dial:  [phone]  Fax: [phone]

## 2018-12-26 ENCOUNTER — Other Ambulatory Visit: Payer: Self-pay

## 2018-12-26 ENCOUNTER — Telehealth: Payer: Self-pay | Admitting: Internal Medicine

## 2018-12-26 NOTE — Telephone Encounter (Signed)
Called pt and informed was sent it to [facility] on 11/30/2018

## 2018-12-26 NOTE — Patient Outreach (Signed)
[name]) Care Management  12/26/2018   [name] 09-Jan-1956 [id number]       Outreach attempt # 2 to the patient for initial assessment.  HIPAA verified.  The patient was able to complete the initial assessment.  Social: The patient lives in the home alone.  He states that he has someone that comes to the home to help him.  He states that he is independent with his ADLS/ IADLS.  He denies any pain today.  He states that he has had two falls in the last year.  He states that he did not injure himself.  He was sitting on the edge of the bed and he nodded off and slid off the bed. Discussed with the patient about precautionary measures.  He verbalizes understanding  The patient states that he has transportation.  The durable medical equipment in the home consist of: wheelchair, cane, dentures, and glasses.  He also has a glucometer scale and CPAP but they are broken.  Conditions: Per chart review and speaking with the patient his conditions include:  HTN, CHF,  OSA, Type II diabetes, Hypothyroidism, Osteoarthritis of hip, Neuropathy, and Hyperlipidemia.  The patient states that he has not checked his blood sugar in a while.  He states that his one touch glucometer broke and he is waiting for someone to call him regarding his [employer].  RN Health Coach called the doctor office and left a message Lumin for someone to call the patient regarding a meter.  Discussed with the patient the importance of checking his blood sugars daily.  Patient verbalized understanding.  Discussed with the patient about his diet and picking healthy food choices.  The patient states that he has had diabetic education and will soon be talking with a nutritionist.  The patient states that he use to go out of the home and exercise but since COVID-19 he is afraid to go out.  He will only go out for necessities.  When he does go out he uses a mask and carries hand sanitizer.  The patient states that he  does not sleep well.  He has not used his CPAP machine because it is broken.  He is schedule to have a virtual visit for him to obtain a new one.  The patient states that he does not weigh himself because his scale broke an he needs one the will go up to 500 lbs.  Medications:  The patient is on twenty two medications per chart review and conversation.  The patient states that he is able to manage his medication and does not express any concern for paying for them.  Appointments:   The patient has a virtual visit with Dr Aundra Dubin on 4/15  Advanced Directives:  The patient does not have an advanced directive but would like to have the information sent to him.  Current Medications:  Current Outpatient Medications  Medication Sig Dispense Refill  . albuterol (PROVENTIL HFA;VENTOLIN HFA) 108 (90 Base) MCG/ACT inhaler Inhale 2 puffs into the lungs every 6 (six) hours as needed for wheezing or shortness of breath. 1 Inhaler 1  . aspirin EC 81 MG tablet     . atorvastatin (LIPITOR) 40 MG tablet TAKE 1 TABLET BY MOUTH DAILY. 90 tablet 1  . bisoprolol (ZEBETA) 10 MG tablet TAKE 1 TABLET (10 MG TOTAL) BY MOUTH EVERY EVENING. (Patient taking differently: Take 40 mg by mouth daily. ) 90 tablet 1  . Cholecalciferol 2000 units TABS Take 1 tablet (  2,000 Units total) by mouth daily. 90 tablet 1  . Continuous Blood Gluc Receiver (FREESTYLE LIBRE 14 DAY READER) DEVI 1 each by Does not apply route every 14 (fourteen) days. 6 Device 1  . Continuous Blood Gluc Sensor (FREESTYLE LIBRE 14 DAY SENSOR) MISC 1 each by Does not apply route every 14 (fourteen) days. 6 each 1  . cyanocobalamin 2000 MCG tablet Take 1 tablet (2,000 mcg total) by mouth daily. 90 tablet 1  . cycloSPORINE (RESTASIS) 0.05 % ophthalmic emulsion Place 1 drop into both eyes 2 (two) times daily. (Patient taking differently: Place 1 drop into both eyes daily. ) 0.4 mL 5  . FEROSUL 325 (65 Fe) MG tablet TAKE 1 TABLET BY MOUTH 2 TIMES DAILY WITH A MEAL.  (Patient taking differently: Take 325 mg by mouth 3 (three) times daily with meals. ) 180 tablet 1  . furosemide (LASIX) 80 MG tablet TAKE 1 TABLET BY MOUTH 2 TIMES DAILY. 60 tablet 5  . gabapentin (NEURONTIN) 100 MG capsule Take 1 capsule (100 mg total) by mouth 3 (three) times daily. (Patient taking differently: Take 300 mg by mouth at bedtime. ) 90 capsule 5  . HYDROcodone-acetaminophen (NORCO) 10-325 MG tablet TAKE 1 TABLET BY MOUTH EVERY 8 HOURS AS NEEDED. 30 tablet 0  . LEVEMIR FLEXTOUCH 100 UNIT/ML Pen INJECT 240 UNITS INTO THE SKIN EVERY MORNING. 75 mL 5  . levothyroxine (SYNTHROID, LEVOTHROID) 50 MCG tablet TAKE 1 TABLET BY MOUTH DAILY BEFORE BREAKFAST. 90 tablet 0  . linaclotide (LINZESS) 290 MCG CAPS capsule Take 1 capsule (290 mcg total) by mouth daily before breakfast. 90 capsule 1  . losartan (COZAAR) 25 MG tablet Take 2 tablets (50 mg total) by mouth daily. NEED FOLLOW UP APPT WITH DR [facility] [phone] 180 tablet 0  . Multiple Vitamins-Minerals (MULTIVITAMIN ADULT PO) Take by mouth.    Marland Kitchen omeprazole (PRILOSEC) 40 MG capsule Take 1 tablet by mouth 30 min before breakfast. 90 capsule 3  . potassium chloride SA (K-DUR,KLOR-CON) 20 MEQ tablet TAKE 1 TABLET BY MOUTH DAILY. 30 tablet 3  . SYNJARDY 01-999 MG TABS TAKE 1 TABLET BY MOUTH 2 (TWO) TIMES DAILY. 180 tablet 1  . thiamine (VITAMIN B-1) 100 MG tablet TAKE 1 TABLET BY MOUTH DAILY. 90 tablet 1  . ULTICARE SHORT PEN NEEDLES 31G X 8 MM MISC USE WITH INSULIN PENS DAILY. 100 each 3  . vitamin B-6 (PYRIDOXINE) 25 MG tablet Take 1 tablet (25 mg total) by mouth daily. 90 tablet 1  . vitamin C (ASCORBIC ACID) 500 MG tablet Take 2 tablets (1,000 mg total) by mouth 2 (two) times daily. Reported on 01/19/2016 180 tablet 1  . Vitamins A & D (VITAMIN A & D) 10000-400 units TABS     . glucose blood (ONETOUCH VERIO) test strip USE  STRIP TO CHECK GLUCOSE THREE TIMES DAILY 100 each 5  . VENTOLIN HFA 108 (90 Base) MCG/ACT inhaler INHALE 2 PUFFS INTO  THE LUNGS EVERY 6 HOURS AS NEEDED FOR WHEEZING OR SHORTNESS OF BREATH. (Patient not taking: Reported on 12/26/2018) 18 g 2  . zinc gluconate 50 MG tablet Take 1 tablet (50 mg total) by mouth daily. Reported on 01/19/2016 (Patient not taking: Reported on 12/26/2018) 90 tablet 1  . zolpidem (AMBIEN) 10 MG tablet Take 1 tablet (10 mg total) by mouth at bedtime as needed for sleep. (Patient not taking: Reported on 12/26/2018) 30 tablet 0   No current facility-administered medications for this visit.     Functional  Status:  In your present state of health, do you have any difficulty performing the following activities: 12/26/2018 11/25/2018  Hearing? N N  Vision? N N  Difficulty concentrating or making decisions? N Y  Walking or climbing stairs? Y Y  Comment problems withhis hip  He uses a walker or moterized wheel chair -  Dressing or bathing? Y Y  Doing errands, shopping? N Y  Some recent data might be hidden    Fall/Depression Screening: Fall Risk  12/26/2018 11/25/2018 11/22/2018  Falls in the past year? 1 0 0  Comment - - -  Number falls in past yr: 1 - 0  Comment patient states that he is not resting and nodding and fell off the bed - -  Injury with Fall? 0 - 0  Risk Factor Category  - - -  Comment - - -  Risk for fall due to : - Impaired balance/gait;Impaired mobility -  Risk for fall due to: Comment - - -  Follow up Education provided Falls evaluation completed;Education provided;Falls prevention discussed Falls evaluation completed   PHQ 2/9 Scores 12/26/2018 11/27/2018 11/22/2018 10/17/2017 08/08/2016 02/03/2016 01/19/2016  PHQ - 2 Score 0 0 0 1 0 0 0  PHQ- 9 Score - - - 9 - - -    Assessment: Patient will benefit from health coach outreach for disease management and support.   [facility] CM Care Plan Problem One     Most Recent Value  Care Plan Problem One  Knowledge deficit related to Diabetes and A1C managment.   Role Documenting the Problem One  Health Coach    [facility] CM Care Plan Problem Three      Most Recent Value  Care Plan for Problem Three  Active  [facility] Long Term Goal   In 90 days the patient will verbalize lowering his a1c of 9.3 by 1-2 points  [facility] Long Term Goal Start Date  12/26/18  Interventions for Problem Three Long Term Goal  Discussed with the patient about diet, healthy food choices, exercise, medication adherence,  Checking his blood sugars daily and recording values,  and precautionary methods for covid 19     Plan: [place] will provide ongoing education for patient on diabetes through phone calls and sending printed information to patient for further discussion.  RN Health Coach will send printed information on diabetes.  RN Health Coach will send initial barriers letter, assessment, and care plan to primary care physician.  RN Health Coach will contact patient in the month of May  and patient agrees to next outreach.    Lazaro Arms RN, BSN, [place] Direct Dial:  [phone]  Fax: [phone]

## 2018-12-26 NOTE — Telephone Encounter (Signed)
Copied from [place] [id number]. Topic: General - Other >> Dec 26, 2018  3:29 PM Valla Leaver wrote: Reason for CRM: Olivia Mackie with [facility] says patient requested freestyle libre to check blood sugar and has not heard back. Please call patient to discuss.

## 2018-12-27 ENCOUNTER — Encounter (HOSPITAL_COMMUNITY): Payer: Medicare HMO | Admitting: Cardiology

## 2018-12-28 ENCOUNTER — Encounter (HOSPITAL_BASED_OUTPATIENT_CLINIC_OR_DEPARTMENT_OTHER): Payer: Medicare HMO | Attending: Internal Medicine

## 2018-12-28 DIAGNOSIS — E11621 Type 2 diabetes mellitus with foot ulcer: Secondary | ICD-10-CM | POA: Insufficient documentation

## 2018-12-28 DIAGNOSIS — I110 Hypertensive heart disease with heart failure: Secondary | ICD-10-CM | POA: Diagnosis not present

## 2018-12-28 DIAGNOSIS — G4730 Sleep apnea, unspecified: Secondary | ICD-10-CM | POA: Insufficient documentation

## 2018-12-28 DIAGNOSIS — E1142 Type 2 diabetes mellitus with diabetic polyneuropathy: Secondary | ICD-10-CM | POA: Insufficient documentation

## 2018-12-28 DIAGNOSIS — I509 Heart failure, unspecified: Secondary | ICD-10-CM | POA: Diagnosis not present

## 2018-12-28 DIAGNOSIS — L97512 Non-pressure chronic ulcer of other part of right foot with fat layer exposed: Secondary | ICD-10-CM | POA: Diagnosis not present

## 2019-01-02 ENCOUNTER — Ambulatory Visit (HOSPITAL_COMMUNITY)
Admission: RE | Admit: 2019-01-02 | Discharge: 2019-01-02 | Disposition: A | Discharge status: Home / Self Care | Payer: Medicare HMO | Source: Ambulatory Visit | Attending: Cardiology | Admitting: Cardiology

## 2019-01-02 ENCOUNTER — Other Ambulatory Visit: Payer: Self-pay

## 2019-01-02 ENCOUNTER — Telehealth (HOSPITAL_COMMUNITY): Payer: Self-pay

## 2019-01-02 DIAGNOSIS — N183 Chronic kidney disease, stage 3 (moderate): Secondary | ICD-10-CM | POA: Diagnosis not present

## 2019-01-02 DIAGNOSIS — I130 Hypertensive heart and chronic kidney disease with heart failure and stage 1 through stage 4 chronic kidney disease, or unspecified chronic kidney disease: Secondary | ICD-10-CM | POA: Diagnosis not present

## 2019-01-02 DIAGNOSIS — L97521 Non-pressure chronic ulcer of other part of left foot limited to breakdown of skin: Secondary | ICD-10-CM | POA: Diagnosis not present

## 2019-01-02 DIAGNOSIS — L97513 Non-pressure chronic ulcer of other part of right foot with necrosis of muscle: Secondary | ICD-10-CM | POA: Diagnosis not present

## 2019-01-02 DIAGNOSIS — I5032 Chronic diastolic (congestive) heart failure: Secondary | ICD-10-CM

## 2019-01-02 DIAGNOSIS — E1142 Type 2 diabetes mellitus with diabetic polyneuropathy: Secondary | ICD-10-CM | POA: Diagnosis not present

## 2019-01-02 DIAGNOSIS — Z794 Long term (current) use of insulin: Secondary | ICD-10-CM | POA: Diagnosis not present

## 2019-01-02 DIAGNOSIS — I10 Essential (primary) hypertension: Secondary | ICD-10-CM

## 2019-01-02 DIAGNOSIS — Z6841 Body Mass Index (BMI) 40.0 and over, adult: Secondary | ICD-10-CM | POA: Diagnosis not present

## 2019-01-02 DIAGNOSIS — L97511 Non-pressure chronic ulcer of other part of right foot limited to breakdown of skin: Secondary | ICD-10-CM | POA: Diagnosis not present

## 2019-01-02 DIAGNOSIS — E11621 Type 2 diabetes mellitus with foot ulcer: Secondary | ICD-10-CM | POA: Diagnosis not present

## 2019-01-02 DIAGNOSIS — I110 Hypertensive heart disease with heart failure: Secondary | ICD-10-CM

## 2019-01-02 DIAGNOSIS — D509 Iron deficiency anemia, unspecified: Secondary | ICD-10-CM | POA: Diagnosis not present

## 2019-01-02 MED ORDER — FUROSEMIDE 80 MG PO TABS: ORAL_TABLET | ORAL | 3 refills | Status: DC

## 2019-01-02 MED ORDER — POTASSIUM CHLORIDE CRYS ER 20 MEQ PO TBCR: 20.0000 meq | EXTENDED_RELEASE_TABLET | Freq: Two times a day (BID) | ORAL | 3 refills | Status: DC

## 2019-01-02 NOTE — Telephone Encounter (Signed)
Reviewed AVS 1. Increase Lasix to 120 qam/80 qpm. Durene Cal to pharm 2. Increase KCl to 20 bid. /sent to pharm 3. BMET/lipids check in 1 week. /4/22 @8 :45 4. Needs telehealth in 1 month. He could not use Doximety. When you talk with him, ask what app worked for video for him in the past with his PCP. Uses webex with PCP from tablet- 30 June @1040   Denied any further questions

## 2019-01-02 NOTE — Patient Instructions (Addendum)
1. Increase Lasix to 120 qam/80 qpm. /sent to pharm 2. Increase KCl to 20 bid. /sent to pharm 3. BMET/lipids check in 1 week. /4/22 @8 :45 4. Needs telehealth in 1 month. He could not use Doximety. When you talk with him, ask what app worked for video for him in the past with his PCP. Uses webex with PCP from tablet- 30 June @1040 

## 2019-01-03 ENCOUNTER — Other Ambulatory Visit: Payer: Self-pay | Admitting: Internal Medicine

## 2019-01-03 DIAGNOSIS — E118 Type 2 diabetes mellitus with unspecified complications: Secondary | ICD-10-CM

## 2019-01-03 NOTE — Progress Notes (Signed)
Heart Failure TeleHealth Note  Due to national recommendations of social distancing due to [place] 19, Audio/video telehealth visit is felt to be most appropriate for this patient at this time.  See MyChart message from today for patient consent regarding telehealth for [facility].  Date:  01/03/2019   ID:  [name], DOB 1955/11/12, MRN [id number]  Location: Home  Provider location: [place] [facility] Type of Visit: Established patient   PCP:  [name], MD  Cardiologist:  Dr. Aundra Dubin  Chief Complaint: Exertional shortness of breath   History of Present Illness: [name] is a 63 y.o. male who presents via audio/video conferencing for a telehealth visit today.     he denies symptoms worrisome for COVID 19.   Patient has history of morbid obesity, diabetes, and diastolic CHF.   He still has limited mobility due to size and uses a rolling walker in the house and a power chair when he goes out. He has been confined to the house recently by coronavirus restrictions and his weight is up.  He is more short of breath walking around the house than usual. Increased lower extremity edema.  He has a cough.  He feels like he is building up excess fluid.   He has been off CPAP for a year, plans to have home sleep study soon.   He is being evaluated to have lap band out and to get gastric sleeve.  This has been postponed by coronavirus.   Labs (8/10): creatinine 1.1, LDL 94, HDL 34 Labs (8/11): K 4.8, creatinine 1.2, LDL 93, HDL 33 Labs (10/12): K 5.4, creatinine 1.5, LDL 95, HDL 42 Labs (8/14): LDL 115, HDL 42 Labs (9/14): BNP 68 Labs (10/14): K 4.5, creatinine 1.6 Labs (7/15): K 4.4, creatinine 1.3 Labs (8/15): LDL 136, HDL 37 Labs (11/17): K 4.6, creatinine 1.43, LDL 111, HDL 37 Labs (4/18): K 4.9, creatinine 1.35, hgb 10.5 Labs (1/19): creatinine 2.56 Labs (3/20): K 4, creatinine 1.88, hgb 10.2, HIV negative  Past Medical History: 1. Diabetes  mellitus.  Has history of diabetic foot ulcer and diabetic peripheral neuropathy. .  2. Hyperlipidemia 3. Hypertension: ACEI cough.  4. Colon cancer: s/p sigmoid colectomy in 2007 5. Morbid obesity s/p lap band procedure.  6. Anemia 7. Obstructive Sleep Apnea: Has been off CPAP.  8. CHF: Diastolic.  Echo (5/12): EF 50-55%, mild LV dilation, mild LVH, moderate (grade II) diastolic dysfunction.  Echo (9/14) with EF 50-55%, mild LVH, moderately dilated LV, RV poorly visualized. - Echo (6/19): EF 55-60%, RV poorly visualized.  9. PFTs (9/10): mild obstructive defect 10.  Dobutamine stress echo (9/10): EF 55%, mild LV hypertrophy, no significant valvular disease, no ischemia or infarction  11. CKD stage 3 12. Ventral hernia s/p repair.   Current Outpatient Medications  Medication Sig Dispense Refill  . albuterol (PROVENTIL HFA;VENTOLIN HFA) 108 (90 Base) MCG/ACT inhaler Inhale 2 puffs into the lungs every 6 (six) hours as needed for wheezing or shortness of breath. 1 Inhaler 1  . aspirin EC 81 MG tablet     . atorvastatin (LIPITOR) 40 MG tablet TAKE 1 TABLET BY MOUTH DAILY. 90 tablet 1  . bisoprolol (ZEBETA) 10 MG tablet TAKE 1 TABLET (10 MG TOTAL) BY MOUTH EVERY EVENING. (Patient taking differently: Take 40 mg by mouth daily. ) 90 tablet 1  . Cholecalciferol 2000 units TABS Take 1 tablet (2,000 Units total) by mouth daily. 90 tablet 1  . Continuous Blood Gluc  Receiver (FREESTYLE LIBRE 14 DAY READER) DEVI 1 each by Does not apply route every 14 (fourteen) days. 6 Device 1  . Continuous Blood Gluc Sensor (FREESTYLE LIBRE 14 DAY SENSOR) MISC 1 each by Does not apply route every 14 (fourteen) days. 6 each 1  . cyanocobalamin 2000 MCG tablet Take 1 tablet (2,000 mcg total) by mouth daily. 90 tablet 1  . cycloSPORINE (RESTASIS) 0.05 % ophthalmic emulsion Place 1 drop into both eyes 2 (two) times daily. (Patient taking differently: Place 1 drop into both eyes daily. ) 0.4 mL 5  . FEROSUL 325 (65 Fe) MG  tablet TAKE 1 TABLET BY MOUTH 2 TIMES DAILY WITH A MEAL. (Patient taking differently: Take 325 mg by mouth 3 (three) times daily with meals. ) 180 tablet 1  . furosemide (LASIX) 80 MG tablet Take 1.5 tablets (120 mg total) by mouth every morning AND 1 tablet (80 mg total) at bedtime. 180 tablet 3  . gabapentin (NEURONTIN) 100 MG capsule Take 1 capsule (100 mg total) by mouth 3 (three) times daily. (Patient taking differently: Take 300 mg by mouth at bedtime. ) 90 capsule 5  . glucose blood (ONETOUCH VERIO) test strip USE  STRIP TO CHECK GLUCOSE THREE TIMES DAILY 100 each 5  . HYDROcodone-acetaminophen (NORCO) 10-325 MG tablet TAKE 1 TABLET BY MOUTH EVERY 8 HOURS AS NEEDED. 30 tablet 0  . LEVEMIR FLEXTOUCH 100 UNIT/ML Pen INJECT 240 UNITS INTO THE SKIN EVERY MORNING. 75 mL 5  . levothyroxine (SYNTHROID, LEVOTHROID) 50 MCG tablet TAKE 1 TABLET BY MOUTH DAILY BEFORE BREAKFAST. 90 tablet 0  . linaclotide (LINZESS) 290 MCG CAPS capsule Take 1 capsule (290 mcg total) by mouth daily before breakfast. 90 capsule 1  . losartan (COZAAR) 25 MG tablet Take 2 tablets (50 mg total) by mouth daily. NEED FOLLOW UP APPT WITH DR [facility] [phone] 180 tablet 0  . Multiple Vitamins-Minerals (MULTIVITAMIN ADULT PO) Take by mouth.    Marland Kitchen omeprazole (PRILOSEC) 40 MG capsule Take 1 tablet by mouth 30 min before breakfast. 90 capsule 3  . potassium chloride SA (K-DUR,KLOR-CON) 20 MEQ tablet Take 1 tablet (20 mEq total) by mouth 2 (two) times daily. 180 tablet 3  . SYNJARDY 01-999 MG TABS TAKE 1 TABLET BY MOUTH 2 (TWO) TIMES DAILY. 180 tablet 1  . thiamine (VITAMIN B-1) 100 MG tablet TAKE 1 TABLET BY MOUTH DAILY. 90 tablet 1  . ULTICARE SHORT PEN NEEDLES 31G X 8 MM MISC USE WITH INSULIN PENS DAILY. 100 each 3  . VENTOLIN HFA 108 (90 Base) MCG/ACT inhaler INHALE 2 PUFFS INTO THE LUNGS EVERY 6 HOURS AS NEEDED FOR WHEEZING OR SHORTNESS OF BREATH. (Patient not taking: Reported on 12/26/2018) 18 g 2  . vitamin B-6 (PYRIDOXINE) 25 MG  tablet Take 1 tablet (25 mg total) by mouth daily. 90 tablet 1  . vitamin C (ASCORBIC ACID) 500 MG tablet Take 2 tablets (1,000 mg total) by mouth 2 (two) times daily. Reported on 01/19/2016 180 tablet 1  . Vitamins A & D (VITAMIN A & D) 10000-400 units TABS     . zinc gluconate 50 MG tablet Take 1 tablet (50 mg total) by mouth daily. Reported on 01/19/2016 (Patient not taking: Reported on 12/26/2018) 90 tablet 1  . zolpidem (AMBIEN) 10 MG tablet Take 1 tablet (10 mg total) by mouth at bedtime as needed for sleep. (Patient not taking: Reported on 12/26/2018) 30 tablet 0   No current facility-administered medications for this encounter.  Allergies:   Shellfish allergy and Lisinopril   Social History:  The patient  reports that he has never smoked. He has never used smokeless tobacco. He reports previous alcohol use. He reports that he does not use drugs.   Family History:  The patient's family history includes CAD in his mother; Congestive Heart Failure in his mother; Diabetes in his father and mother; Heart attack (age of onset: 72) in his brother; Heart failure in his brother; Hepatitis in his mother.   ROS:  Please see the history of present illness.   All other systems are personally reviewed and negative.   Exam:  (Video/Tele Health Call; Exam is subjective and or/visual.) General:  Speaks in full sentences. No resp difficulty. Lungs: Normal respiratory effort with conversation.  Abdomen: Non-distended per patient report Extremities: 1+ ankle edema. Neuro: Alert & oriented x 3.   Recent Labs: 02/13/2018: ALT 16 06/01/2018: TSH 2.84 11/22/2018: BUN 30; Creatinine, Ser 1.88; Hemoglobin 10.2; Platelets 318.0; Potassium 4.0; Sodium 143  Personally reviewed   Wt Readings from Last 3 Encounters:  11/22/18 (!) 210.9 kg (465 lb)  05/31/18 (!) 208.1 kg (458 lb 12 oz)  02/08/18 (!) 216.4 kg (477 lb)      ASSESSMENT AND PLAN:  1. Chronic diastolic CHF:  Increase peripheral edema and weight at  home, more short of breath.  NYHA class IIIb. I suspect that he is volume overloaded.  - Increase Lasix to 120 mg qam/80 mg qpm.  - Increase KCl to 20 bid.  - BMET in 1 week.   2. Obesity: Weight loss is imperative but will be difficult given his limited mobility.  He has been evaluated for removal of lap band and performance of gastric sleeve but this has been postponed by coronavirus. 3. HTN: BP controlled.  4. OSA: Needs home sleep study to get back on CPAP.  This has been arranged.   COVID screen The patient does not have any symptoms that suggest any further testing/ screening at this time.  Social distancing reinforced today.  Relevant cardiac medications were reviewed at length with the patient today.   The patient does not have concerns regarding their medications at this time.   Recommended follow-up:  Telehealth 1 month  Today, I have spent 21 minutes with the patient with telehealth technology discussing the above issues .    Signed, [name], MD  01/03/2019 8:24 AM  [place] [address] [facility] [place] [address] [phone] (office) [phone] (fax)

## 2019-01-03 NOTE — Telephone Encounter (Signed)
Please advise 

## 2019-01-03 NOTE — Telephone Encounter (Signed)
Per patient the Freestyle needs to be sent through [facility], he also would like a call back before this is sent so he can be sure the doctor office understands all the information the pharmacy needs for him to receive this.  Call back [phone] (home)

## 2019-01-04 DIAGNOSIS — Z794 Long term (current) use of insulin: Secondary | ICD-10-CM | POA: Diagnosis not present

## 2019-01-04 DIAGNOSIS — I110 Hypertensive heart disease with heart failure: Secondary | ICD-10-CM | POA: Diagnosis not present

## 2019-01-04 DIAGNOSIS — E1142 Type 2 diabetes mellitus with diabetic polyneuropathy: Secondary | ICD-10-CM | POA: Diagnosis not present

## 2019-01-04 DIAGNOSIS — E11621 Type 2 diabetes mellitus with foot ulcer: Secondary | ICD-10-CM | POA: Diagnosis not present

## 2019-01-04 DIAGNOSIS — L97511 Non-pressure chronic ulcer of other part of right foot limited to breakdown of skin: Secondary | ICD-10-CM | POA: Diagnosis not present

## 2019-01-04 DIAGNOSIS — L97513 Non-pressure chronic ulcer of other part of right foot with necrosis of muscle: Secondary | ICD-10-CM | POA: Diagnosis not present

## 2019-01-04 DIAGNOSIS — I5032 Chronic diastolic (congestive) heart failure: Secondary | ICD-10-CM | POA: Diagnosis not present

## 2019-01-04 DIAGNOSIS — L97521 Non-pressure chronic ulcer of other part of left foot limited to breakdown of skin: Secondary | ICD-10-CM | POA: Diagnosis not present

## 2019-01-04 DIAGNOSIS — D509 Iron deficiency anemia, unspecified: Secondary | ICD-10-CM | POA: Diagnosis not present

## 2019-01-07 MED ORDER — FREESTYLE LIBRE 14 DAY READER DEVI: 1.0000 | 1 refills | Status: DC

## 2019-01-08 DIAGNOSIS — E1142 Type 2 diabetes mellitus with diabetic polyneuropathy: Secondary | ICD-10-CM | POA: Diagnosis not present

## 2019-01-08 DIAGNOSIS — L97513 Non-pressure chronic ulcer of other part of right foot with necrosis of muscle: Secondary | ICD-10-CM | POA: Diagnosis not present

## 2019-01-08 DIAGNOSIS — E11621 Type 2 diabetes mellitus with foot ulcer: Secondary | ICD-10-CM | POA: Diagnosis not present

## 2019-01-08 DIAGNOSIS — L97511 Non-pressure chronic ulcer of other part of right foot limited to breakdown of skin: Secondary | ICD-10-CM | POA: Diagnosis not present

## 2019-01-08 DIAGNOSIS — Z794 Long term (current) use of insulin: Secondary | ICD-10-CM | POA: Diagnosis not present

## 2019-01-08 DIAGNOSIS — I5032 Chronic diastolic (congestive) heart failure: Secondary | ICD-10-CM | POA: Diagnosis not present

## 2019-01-08 DIAGNOSIS — I110 Hypertensive heart disease with heart failure: Secondary | ICD-10-CM | POA: Diagnosis not present

## 2019-01-08 DIAGNOSIS — L97521 Non-pressure chronic ulcer of other part of left foot limited to breakdown of skin: Secondary | ICD-10-CM | POA: Diagnosis not present

## 2019-01-08 DIAGNOSIS — D509 Iron deficiency anemia, unspecified: Secondary | ICD-10-CM | POA: Diagnosis not present

## 2019-01-09 ENCOUNTER — Other Ambulatory Visit (HOSPITAL_COMMUNITY): Payer: Medicare HMO

## 2019-01-10 DIAGNOSIS — L97521 Non-pressure chronic ulcer of other part of left foot limited to breakdown of skin: Secondary | ICD-10-CM | POA: Diagnosis not present

## 2019-01-10 DIAGNOSIS — Z794 Long term (current) use of insulin: Secondary | ICD-10-CM | POA: Diagnosis not present

## 2019-01-10 DIAGNOSIS — L97511 Non-pressure chronic ulcer of other part of right foot limited to breakdown of skin: Secondary | ICD-10-CM | POA: Diagnosis not present

## 2019-01-10 DIAGNOSIS — D509 Iron deficiency anemia, unspecified: Secondary | ICD-10-CM | POA: Diagnosis not present

## 2019-01-10 DIAGNOSIS — I5032 Chronic diastolic (congestive) heart failure: Secondary | ICD-10-CM | POA: Diagnosis not present

## 2019-01-10 DIAGNOSIS — L97513 Non-pressure chronic ulcer of other part of right foot with necrosis of muscle: Secondary | ICD-10-CM | POA: Diagnosis not present

## 2019-01-10 DIAGNOSIS — I110 Hypertensive heart disease with heart failure: Secondary | ICD-10-CM | POA: Diagnosis not present

## 2019-01-10 DIAGNOSIS — E11621 Type 2 diabetes mellitus with foot ulcer: Secondary | ICD-10-CM | POA: Diagnosis not present

## 2019-01-10 DIAGNOSIS — E1142 Type 2 diabetes mellitus with diabetic polyneuropathy: Secondary | ICD-10-CM | POA: Diagnosis not present

## 2019-01-11 DIAGNOSIS — I509 Heart failure, unspecified: Secondary | ICD-10-CM | POA: Diagnosis not present

## 2019-01-11 DIAGNOSIS — E11621 Type 2 diabetes mellitus with foot ulcer: Secondary | ICD-10-CM | POA: Diagnosis not present

## 2019-01-11 DIAGNOSIS — I110 Hypertensive heart disease with heart failure: Secondary | ICD-10-CM | POA: Diagnosis not present

## 2019-01-11 DIAGNOSIS — E1142 Type 2 diabetes mellitus with diabetic polyneuropathy: Secondary | ICD-10-CM | POA: Diagnosis not present

## 2019-01-11 DIAGNOSIS — G4730 Sleep apnea, unspecified: Secondary | ICD-10-CM | POA: Diagnosis not present

## 2019-01-11 DIAGNOSIS — L97512 Non-pressure chronic ulcer of other part of right foot with fat layer exposed: Secondary | ICD-10-CM | POA: Diagnosis not present

## 2019-01-15 DIAGNOSIS — Z794 Long term (current) use of insulin: Secondary | ICD-10-CM | POA: Diagnosis not present

## 2019-01-15 DIAGNOSIS — L97513 Non-pressure chronic ulcer of other part of right foot with necrosis of muscle: Secondary | ICD-10-CM | POA: Diagnosis not present

## 2019-01-15 DIAGNOSIS — I5032 Chronic diastolic (congestive) heart failure: Secondary | ICD-10-CM | POA: Diagnosis not present

## 2019-01-15 DIAGNOSIS — L97521 Non-pressure chronic ulcer of other part of left foot limited to breakdown of skin: Secondary | ICD-10-CM | POA: Diagnosis not present

## 2019-01-15 DIAGNOSIS — E1142 Type 2 diabetes mellitus with diabetic polyneuropathy: Secondary | ICD-10-CM | POA: Diagnosis not present

## 2019-01-15 DIAGNOSIS — E11621 Type 2 diabetes mellitus with foot ulcer: Secondary | ICD-10-CM | POA: Diagnosis not present

## 2019-01-15 DIAGNOSIS — L97511 Non-pressure chronic ulcer of other part of right foot limited to breakdown of skin: Secondary | ICD-10-CM | POA: Diagnosis not present

## 2019-01-15 DIAGNOSIS — I110 Hypertensive heart disease with heart failure: Secondary | ICD-10-CM | POA: Diagnosis not present

## 2019-01-15 DIAGNOSIS — D509 Iron deficiency anemia, unspecified: Secondary | ICD-10-CM | POA: Diagnosis not present

## 2019-01-17 ENCOUNTER — Encounter: Payer: Self-pay | Admitting: Internal Medicine

## 2019-01-17 ENCOUNTER — Ambulatory Visit (INDEPENDENT_AMBULATORY_CARE_PROVIDER_SITE_OTHER): Payer: Medicare HMO | Admitting: Internal Medicine

## 2019-01-17 ENCOUNTER — Ambulatory Visit (INDEPENDENT_AMBULATORY_CARE_PROVIDER_SITE_OTHER): Payer: Medicare HMO | Admitting: Neurology

## 2019-01-17 ENCOUNTER — Other Ambulatory Visit: Payer: Self-pay

## 2019-01-17 DIAGNOSIS — L97511 Non-pressure chronic ulcer of other part of right foot limited to breakdown of skin: Secondary | ICD-10-CM | POA: Diagnosis not present

## 2019-01-17 DIAGNOSIS — D509 Iron deficiency anemia, unspecified: Secondary | ICD-10-CM | POA: Diagnosis not present

## 2019-01-17 DIAGNOSIS — E118 Type 2 diabetes mellitus with unspecified complications: Secondary | ICD-10-CM

## 2019-01-17 DIAGNOSIS — Z9884 Bariatric surgery status: Secondary | ICD-10-CM

## 2019-01-17 DIAGNOSIS — L97513 Non-pressure chronic ulcer of other part of right foot with necrosis of muscle: Secondary | ICD-10-CM | POA: Diagnosis not present

## 2019-01-17 DIAGNOSIS — Z6841 Body Mass Index (BMI) 40.0 and over, adult: Secondary | ICD-10-CM

## 2019-01-17 DIAGNOSIS — I5032 Chronic diastolic (congestive) heart failure: Secondary | ICD-10-CM

## 2019-01-17 DIAGNOSIS — E662 Morbid (severe) obesity with alveolar hypoventilation: Secondary | ICD-10-CM

## 2019-01-17 DIAGNOSIS — E785 Hyperlipidemia, unspecified: Secondary | ICD-10-CM

## 2019-01-17 DIAGNOSIS — L97521 Non-pressure chronic ulcer of other part of left foot limited to breakdown of skin: Secondary | ICD-10-CM | POA: Diagnosis not present

## 2019-01-17 DIAGNOSIS — G4733 Obstructive sleep apnea (adult) (pediatric): Secondary | ICD-10-CM

## 2019-01-17 DIAGNOSIS — E1121 Type 2 diabetes mellitus with diabetic nephropathy: Secondary | ICD-10-CM

## 2019-01-17 DIAGNOSIS — E1142 Type 2 diabetes mellitus with diabetic polyneuropathy: Secondary | ICD-10-CM

## 2019-01-17 DIAGNOSIS — I110 Hypertensive heart disease with heart failure: Secondary | ICD-10-CM | POA: Diagnosis not present

## 2019-01-17 DIAGNOSIS — G4719 Other hypersomnia: Secondary | ICD-10-CM

## 2019-01-17 DIAGNOSIS — Z794 Long term (current) use of insulin: Secondary | ICD-10-CM | POA: Diagnosis not present

## 2019-01-17 DIAGNOSIS — R0601 Orthopnea: Secondary | ICD-10-CM

## 2019-01-17 DIAGNOSIS — E11621 Type 2 diabetes mellitus with foot ulcer: Secondary | ICD-10-CM | POA: Diagnosis not present

## 2019-01-17 MED ORDER — PHENTERMINE HCL 37.5 MG PO CAPS: 37.5000 mg | ORAL_CAPSULE | ORAL | 0 refills | Status: DC

## 2019-01-17 NOTE — Progress Notes (Signed)
Virtual Visit via Video Note  I connected with [name] on 01/17/19 at  3:00 PM EDT by a video enabled telemedicine application and verified that I am speaking with the correct person using two identifiers.   I discussed the limitations of evaluation and management by telemedicine and the availability of in person appointments. The patient expressed understanding and agreed to proceed.  History of Present Illness: He checked in for a virtual visit.  He was not willing to come in due to the COVID-19 pandemic.  He complains that he is gaining weight, has trouble controlling his caloric intake, has trouble controlling his blood sugar, and wants to try an appetite suppressant.  He denies polys, chest pain, shortness of breath, abdominal pain, or diarrhea.    Observations/Objective: He was in no acute distress.  He was calm, cooperative, and appropriate.  His mood and affect were normal.  Lab Results  Component Value Date   WBC 7.8 11/22/2018   HGB 10.2 (L) 11/22/2018   HCT 32.4 (L) 11/22/2018   PLT 318.0 11/22/2018   GLUCOSE 47 (LL) 11/22/2018   CHOL 139 02/13/2018   TRIG 101.0 02/13/2018   HDL 29.40 (L) 02/13/2018   LDLDIRECT 136.8 12/12/2006   LDLCALC 90 02/13/2018   ALT 16 02/13/2018   AST 23 02/13/2018   NA 143 11/22/2018   K 4.0 11/22/2018   CL 102 11/22/2018   CREATININE 1.88 (H) 11/22/2018   BUN 30 (H) 11/22/2018   CO2 32 11/22/2018   TSH 2.84 06/01/2018   PSA 1.43 11/22/2018   HGBA1C 9.3 (A) 11/22/2018   MICROALBUR 1.0 06/01/2018     Assessment and Plan: I wrote a 30-day prescription for phentermine.  This will help reduce his caloric intake which will help him lose weight which should hope with his blood sugar control.  Also, I have asked him to continue to work on his lifestyle modifications.  He will follow-up in 1 month and if he has lost weight then I will consider continuing the phentermine but if if it is not helping him lose weight then it will be  discontinued.   Follow Up Instructions: He will let me know if he develops any new or worsening symptoms.  He agrees to take the phentermine and to improve his lifestyle modifications.  He will follow-up in about a month or 2 to monitor his blood pressure, his response to phentermine, and to see if it is helping him lose weight.    I discussed the assessment and treatment plan with the patient. The patient was provided an opportunity to ask questions and all were answered. The patient agreed with the plan and demonstrated an understanding of the instructions.   The patient was advised to call back or seek an in-person evaluation if the symptoms worsen or if the condition fails to improve as anticipated.  I provided 20 minutes of non-face-to-face time during this encounter.   Scarlette Calico, MD

## 2019-01-18 ENCOUNTER — Other Ambulatory Visit: Payer: Self-pay | Admitting: Internal Medicine

## 2019-01-18 DIAGNOSIS — M159 Polyosteoarthritis, unspecified: Secondary | ICD-10-CM

## 2019-01-18 DIAGNOSIS — M5416 Radiculopathy, lumbar region: Secondary | ICD-10-CM

## 2019-01-18 DIAGNOSIS — M160 Bilateral primary osteoarthritis of hip: Secondary | ICD-10-CM

## 2019-01-18 DIAGNOSIS — E1121 Type 2 diabetes mellitus with diabetic nephropathy: Secondary | ICD-10-CM

## 2019-01-18 DIAGNOSIS — M150 Primary generalized (osteo)arthritis: Principal | ICD-10-CM

## 2019-01-22 ENCOUNTER — Telehealth: Payer: Self-pay | Admitting: Internal Medicine

## 2019-01-22 ENCOUNTER — Other Ambulatory Visit: Payer: Self-pay | Admitting: Internal Medicine

## 2019-01-22 DIAGNOSIS — D509 Iron deficiency anemia, unspecified: Secondary | ICD-10-CM | POA: Diagnosis not present

## 2019-01-22 DIAGNOSIS — L97513 Non-pressure chronic ulcer of other part of right foot with necrosis of muscle: Secondary | ICD-10-CM | POA: Diagnosis not present

## 2019-01-22 DIAGNOSIS — I110 Hypertensive heart disease with heart failure: Secondary | ICD-10-CM | POA: Diagnosis not present

## 2019-01-22 DIAGNOSIS — E1142 Type 2 diabetes mellitus with diabetic polyneuropathy: Secondary | ICD-10-CM | POA: Diagnosis not present

## 2019-01-22 DIAGNOSIS — E11621 Type 2 diabetes mellitus with foot ulcer: Secondary | ICD-10-CM | POA: Diagnosis not present

## 2019-01-22 DIAGNOSIS — Z794 Long term (current) use of insulin: Secondary | ICD-10-CM | POA: Diagnosis not present

## 2019-01-22 DIAGNOSIS — I5032 Chronic diastolic (congestive) heart failure: Secondary | ICD-10-CM | POA: Diagnosis not present

## 2019-01-22 NOTE — Telephone Encounter (Signed)
Yes, on the request  Ask his insurance co. which blood sugar device they cover  TJ

## 2019-01-22 NOTE — Telephone Encounter (Signed)
Routing to dr jones, please advise, I will call home health back, thanks 

## 2019-01-22 NOTE — Telephone Encounter (Signed)
Copied from [place] [id number]. Topic: Quick Communication - Home Health Verbal Orders >> Jan 22, 2019  2:37 PM Oneta Rack wrote: Caller/Agency: Malachi Bonds Number:RN from [facility] [phone]  Requesting :Requesting verbal orders for recert  2x for 8 weeks to address wound care and disease management. Patient Continuous Blood Gluc Receiver (FREESTYLE LIBRE 14 DAY READER) [employer] insurance doesn't cover requesting "basic" meter

## 2019-01-23 NOTE — Telephone Encounter (Signed)
Patient to contact insurance company now to find out what meter they will cover, patient will call back with that name so that we can send request to his pharmacy-----[facility] advised of ok orders for home health

## 2019-01-24 DIAGNOSIS — I5032 Chronic diastolic (congestive) heart failure: Secondary | ICD-10-CM | POA: Diagnosis not present

## 2019-01-24 DIAGNOSIS — E11621 Type 2 diabetes mellitus with foot ulcer: Secondary | ICD-10-CM | POA: Diagnosis not present

## 2019-01-24 DIAGNOSIS — I110 Hypertensive heart disease with heart failure: Secondary | ICD-10-CM | POA: Diagnosis not present

## 2019-01-24 DIAGNOSIS — D509 Iron deficiency anemia, unspecified: Secondary | ICD-10-CM | POA: Diagnosis not present

## 2019-01-24 DIAGNOSIS — E1142 Type 2 diabetes mellitus with diabetic polyneuropathy: Secondary | ICD-10-CM | POA: Diagnosis not present

## 2019-01-24 DIAGNOSIS — Z794 Long term (current) use of insulin: Secondary | ICD-10-CM | POA: Diagnosis not present

## 2019-01-24 DIAGNOSIS — L97513 Non-pressure chronic ulcer of other part of right foot with necrosis of muscle: Secondary | ICD-10-CM | POA: Diagnosis not present

## 2019-01-25 ENCOUNTER — Encounter (HOSPITAL_BASED_OUTPATIENT_CLINIC_OR_DEPARTMENT_OTHER): Payer: Medicare HMO | Attending: Internal Medicine

## 2019-01-25 ENCOUNTER — Other Ambulatory Visit: Payer: Self-pay

## 2019-01-25 DIAGNOSIS — I110 Hypertensive heart disease with heart failure: Secondary | ICD-10-CM | POA: Insufficient documentation

## 2019-01-25 DIAGNOSIS — G4730 Sleep apnea, unspecified: Secondary | ICD-10-CM | POA: Insufficient documentation

## 2019-01-25 DIAGNOSIS — I509 Heart failure, unspecified: Secondary | ICD-10-CM | POA: Insufficient documentation

## 2019-01-25 DIAGNOSIS — L97512 Non-pressure chronic ulcer of other part of right foot with fat layer exposed: Secondary | ICD-10-CM | POA: Insufficient documentation

## 2019-01-25 DIAGNOSIS — E11621 Type 2 diabetes mellitus with foot ulcer: Secondary | ICD-10-CM | POA: Insufficient documentation

## 2019-01-25 DIAGNOSIS — E1142 Type 2 diabetes mellitus with diabetic polyneuropathy: Secondary | ICD-10-CM | POA: Insufficient documentation

## 2019-01-25 NOTE — Patient Outreach (Signed)
[facility]) Care Management  01/25/2019   Hawthorne Day 25-Aug-1956 [id number]  Subjective: Successful outreach to the patient.  HIPAA verified by the patient.  He states that he has been doing fine.  He denies any pain,shortness of breath, swelling, or falls.  He states that he has not been checking his blood sugars because he does not have a glucometer.  He states that he needs to call his insurance to find out which one they will approve.  Discussed the importance of checking his blood sugars and reviewed Hypo and Hyperglycemic signs and symptom.  He verbalized understanding.   The patient states that he has his medications and taking them as prescribed.  He has food in the home.  He states that he only goes out once a month. The patient states that he did not receive any of the information that I had sent to him.  It will be resent.  He states that he had a virtual visit with Dr Aundra Dubin on 4/15 and  an Increase Lasix to 120 mg qam/80 mg qpm. Increase KCl to 20 meq bid. He also had a virtual visit with Dr [name] on 4/30.    Current Medications:  Current Outpatient Medications  Medication Sig Dispense Refill  . HYDROcodone-acetaminophen (NORCO) 10-325 MG tablet TAKE 1 TABLET BY MOUTH EVERY 8 HOURS AS NEEDED. 30 tablet 0  . LEVEMIR FLEXTOUCH 100 UNIT/ML Pen INJECT 240 UNITS INTO THE SKIN EVERY MORNING. 75 mL 5  . levothyroxine (SYNTHROID, LEVOTHROID) 50 MCG tablet TAKE 1 TABLET BY MOUTH DAILY BEFORE BREAKFAST. 90 tablet 0  . linaclotide (LINZESS) 290 MCG CAPS capsule Take 1 capsule (290 mcg total) by mouth daily before breakfast. 90 capsule 1  . losartan (COZAAR) 25 MG tablet Take 2 tablets (50 mg total) by mouth daily. NEED FOLLOW UP APPT WITH DR [facility] [phone] 180 tablet 0  . Multiple Vitamins-Minerals (MULTIVITAMIN ADULT PO) Take by mouth.    Marland Kitchen omeprazole (PRILOSEC) 40 MG capsule Take 1 tablet by mouth 30 min before breakfast. 90 capsule 3  . phentermine 37.5 MG capsule Take  1 capsule (37.5 mg total) by mouth every morning. 30 capsule 0  . potassium chloride SA (K-DUR,KLOR-CON) 20 MEQ tablet Take 1 tablet (20 mEq total) by mouth 2 (two) times daily. 180 tablet 3  . SYNJARDY 01-999 MG TABS TAKE 1 TABLET BY MOUTH 2 (TWO) TIMES DAILY. 180 tablet 1  . thiamine (VITAMIN B-1) 100 MG tablet TAKE 1 TABLET BY MOUTH DAILY. 90 tablet 1  . ULTICARE SHORT PEN NEEDLES 31G X 8 MM MISC USE WITH INSULIN PENS DAILY. 100 each 3  . VENTOLIN HFA 108 (90 Base) MCG/ACT inhaler INHALE 2 PUFFS INTO THE LUNGS EVERY 6 HOURS AS NEEDED FOR WHEEZING OR SHORTNESS OF BREATH. 18 g 2  . vitamin B-6 (PYRIDOXINE) 25 MG tablet Take 1 tablet (25 mg total) by mouth daily. 90 tablet 1  . vitamin C (ASCORBIC ACID) 500 MG tablet Take 2 tablets (1,000 mg total) by mouth 2 (two) times daily. Reported on 01/19/2016 180 tablet 1  . Vitamins A & D (VITAMIN A & D) 10000-400 units TABS     . albuterol (PROVENTIL HFA;VENTOLIN HFA) 108 (90 Base) MCG/ACT inhaler Inhale 2 puffs into the lungs every 6 (six) hours as needed for wheezing or shortness of breath. 1 Inhaler 1  . aspirin EC 81 MG tablet     . atorvastatin (LIPITOR) 40 MG tablet TAKE 1 TABLET BY MOUTH DAILY. 90 tablet  1  . bisoprolol (ZEBETA) 10 MG tablet TAKE 1 TABLET (10 MG TOTAL) BY MOUTH EVERY EVENING. (Patient taking differently: Take 40 mg by mouth daily. ) 90 tablet 1  . Cholecalciferol 2000 units TABS Take 1 tablet (2,000 Units total) by mouth daily. 90 tablet 1  . Continuous Blood Gluc Receiver (FREESTYLE LIBRE 14 DAY READER) DEVI 1 each by Does not apply route every 14 (fourteen) days. 6 Device 1  . Continuous Blood Gluc Sensor (FREESTYLE LIBRE 14 DAY SENSOR) MISC 1 each by Does not apply route every 14 (fourteen) days. 6 each 1  . cyanocobalamin 2000 MCG tablet Take 1 tablet (2,000 mcg total) by mouth daily. 90 tablet 1  . cycloSPORINE (RESTASIS) 0.05 % ophthalmic emulsion Place 1 drop into both eyes 2 (two) times daily. (Patient taking differently:  Place 1 drop into both eyes daily. ) 0.4 mL 5  . FEROSUL 325 (65 Fe) MG tablet TAKE 1 TABLET BY MOUTH 2 TIMES DAILY WITH A MEAL. (Patient taking differently: Take 325 mg by mouth 3 (three) times daily with meals. ) 180 tablet 1  . furosemide (LASIX) 80 MG tablet Take 1.5 tablets (120 mg total) by mouth every morning AND 1 tablet (80 mg total) at bedtime. 180 tablet 3  . gabapentin (NEURONTIN) 100 MG capsule Take 1 capsule (100 mg total) by mouth 3 (three) times daily. (Patient taking differently: Take 300 mg by mouth at bedtime. ) 90 capsule 5  . glucose blood (ONETOUCH VERIO) test strip USE  STRIP TO CHECK GLUCOSE THREE TIMES DAILY 100 each 5  . zinc gluconate 50 MG tablet Take 1 tablet (50 mg total) by mouth daily. Reported on 01/19/2016 (Patient not taking: Reported on 12/26/2018) 90 tablet 1   No current facility-administered medications for this visit.     Functional Status:  In your present state of health, do you have any difficulty performing the following activities: 12/26/2018 11/25/2018  Hearing? N N  Vision? N N  Difficulty concentrating or making decisions? N Y  Walking or climbing stairs? Y Y  Comment problems withhis hip  He uses a walker or moterized wheel chair -  Dressing or bathing? Y Y  Doing errands, shopping? N Y  Some recent data might be hidden    Fall/Depression Screening: Fall Risk  01/25/2019 12/26/2018 11/25/2018  Falls in the past year? 0 1 0  Comment - - -  Number falls in past yr: - 1 -  Comment - patient states that he is not resting and nodding and fell off the bed -  Injury with Fall? - 0 -  Risk Factor Category  - - -  Comment - - -  Risk for fall due to : - - Impaired balance/gait;Impaired mobility  Risk for fall due to: Comment - - -  Follow up - Education provided Falls evaluation completed;Education provided;Falls prevention discussed   PHQ 2/9 Scores 12/26/2018 11/27/2018 11/22/2018 10/17/2017 08/08/2016 02/03/2016 01/19/2016  PHQ - 2 Score 0 0 0 1 0 0 0  PHQ- 9  Score - - - 9 - - -    Assessment: Patient will continue to benefit from health coach outreach for disease management and support. [facility] CM Care Plan Problem One     Most Recent Value  Care Plan for Problem One  Active  [facility] Long Term Goal   in 90 days the patient will lower his a1c 1-2 points  [facility] Long Term Goal Start Date  01/25/19  Interventions for Problem  One Long Term Goal  Discussed with the patient about checking his blood sugars. encouraged the patient to cal his insurance company for the name of the glucometer he is allowed,  discussed the signs and symptoms of hypo and hyper glycemia,, encouraged the patient to make healthy food choices       Plan: [place] will contact patient in the month of June and patient agrees to next outreach.   Lazaro Arms RN, BSN, [place] Direct Dial:  [phone]  Fax: [phone]

## 2019-01-29 DIAGNOSIS — L97513 Non-pressure chronic ulcer of other part of right foot with necrosis of muscle: Secondary | ICD-10-CM | POA: Diagnosis not present

## 2019-01-29 DIAGNOSIS — I110 Hypertensive heart disease with heart failure: Secondary | ICD-10-CM | POA: Diagnosis not present

## 2019-01-29 DIAGNOSIS — Z794 Long term (current) use of insulin: Secondary | ICD-10-CM | POA: Diagnosis not present

## 2019-01-29 DIAGNOSIS — I5032 Chronic diastolic (congestive) heart failure: Secondary | ICD-10-CM | POA: Diagnosis not present

## 2019-01-29 DIAGNOSIS — E1142 Type 2 diabetes mellitus with diabetic polyneuropathy: Secondary | ICD-10-CM | POA: Diagnosis not present

## 2019-01-29 DIAGNOSIS — D509 Iron deficiency anemia, unspecified: Secondary | ICD-10-CM | POA: Diagnosis not present

## 2019-01-29 DIAGNOSIS — E11621 Type 2 diabetes mellitus with foot ulcer: Secondary | ICD-10-CM | POA: Diagnosis not present

## 2019-01-30 ENCOUNTER — Telehealth: Payer: Self-pay | Admitting: Internal Medicine

## 2019-01-30 NOTE — Telephone Encounter (Signed)
Copied from [place] [id number]. Topic: Quick Communication - See Telephone Encounter >> Jan 30, 2019 11:28 AM Vernona Rieger wrote: CRM for notification. See Telephone encounter for: 01/30/19.  Patty, LPN with [facility] called to report that he fell out of the bed Friday. He hurt his right knee, he does have a knot on it and it is painful  She also wants to add a physical therapy evaluation and would like for him to have a diabetes meter since he is a diabetic. She advised him to do this weeks ago but he has not.  Call back @ [phone]

## 2019-01-31 DIAGNOSIS — I5032 Chronic diastolic (congestive) heart failure: Secondary | ICD-10-CM | POA: Diagnosis not present

## 2019-01-31 DIAGNOSIS — Z794 Long term (current) use of insulin: Secondary | ICD-10-CM | POA: Diagnosis not present

## 2019-01-31 DIAGNOSIS — E1142 Type 2 diabetes mellitus with diabetic polyneuropathy: Secondary | ICD-10-CM | POA: Diagnosis not present

## 2019-01-31 DIAGNOSIS — D509 Iron deficiency anemia, unspecified: Secondary | ICD-10-CM | POA: Diagnosis not present

## 2019-01-31 DIAGNOSIS — L97513 Non-pressure chronic ulcer of other part of right foot with necrosis of muscle: Secondary | ICD-10-CM | POA: Diagnosis not present

## 2019-01-31 DIAGNOSIS — I110 Hypertensive heart disease with heart failure: Secondary | ICD-10-CM | POA: Diagnosis not present

## 2019-01-31 DIAGNOSIS — E11621 Type 2 diabetes mellitus with foot ulcer: Secondary | ICD-10-CM | POA: Diagnosis not present

## 2019-01-31 NOTE — Telephone Encounter (Signed)
[place] for doxy visit if possible

## 2019-02-01 ENCOUNTER — Ambulatory Visit: Payer: Medicare HMO | Admitting: Internal Medicine

## 2019-02-01 ENCOUNTER — Ambulatory Visit (INDEPENDENT_AMBULATORY_CARE_PROVIDER_SITE_OTHER): Payer: Medicare HMO | Admitting: Internal Medicine

## 2019-02-01 DIAGNOSIS — I110 Hypertensive heart disease with heart failure: Secondary | ICD-10-CM | POA: Diagnosis not present

## 2019-02-01 DIAGNOSIS — M25561 Pain in right knee: Secondary | ICD-10-CM

## 2019-02-01 DIAGNOSIS — M5416 Radiculopathy, lumbar region: Secondary | ICD-10-CM

## 2019-02-01 DIAGNOSIS — I509 Heart failure, unspecified: Secondary | ICD-10-CM | POA: Diagnosis not present

## 2019-02-01 DIAGNOSIS — E1142 Type 2 diabetes mellitus with diabetic polyneuropathy: Secondary | ICD-10-CM | POA: Diagnosis not present

## 2019-02-01 DIAGNOSIS — Z79891 Long term (current) use of opiate analgesic: Secondary | ICD-10-CM | POA: Diagnosis not present

## 2019-02-01 DIAGNOSIS — L97512 Non-pressure chronic ulcer of other part of right foot with fat layer exposed: Secondary | ICD-10-CM | POA: Diagnosis not present

## 2019-02-01 DIAGNOSIS — G4730 Sleep apnea, unspecified: Secondary | ICD-10-CM | POA: Diagnosis not present

## 2019-02-01 DIAGNOSIS — E11621 Type 2 diabetes mellitus with foot ulcer: Secondary | ICD-10-CM | POA: Diagnosis not present

## 2019-02-01 DIAGNOSIS — E118 Type 2 diabetes mellitus with unspecified complications: Secondary | ICD-10-CM | POA: Diagnosis not present

## 2019-02-01 MED ORDER — HYDROCODONE-ACETAMINOPHEN 10-325 MG PO TABS: 1.0000 | ORAL_TABLET | Freq: Three times a day (TID) | ORAL | 0 refills | Status: DC | PRN

## 2019-02-01 NOTE — Patient Instructions (Signed)
I will try to talk with Dr Tamala Julian in the office about your knee and maybe being seen in the office for knee bursitis on Monday May 18  Please continue all other medications as before, and refill was sent for the hydrocodone.  Please have the pharmacy call with any other refills you may need.  Please keep your appointments with your specialists as you may have planned

## 2019-02-01 NOTE — Progress Notes (Signed)
Patient ID: [name], male   DOB: 09/10/1956, 63 y.o.   MRN: [id number]  Virtual Visit via Video Note but phone only due to video fail  I connected with [name] on 02/01/19 at  4:00 PM EDT by a video enabled telemedicine application and verified that I am speaking with the correct person using two identifiers.  Location: Patient: at home Provider: at office   I discussed the limitations of evaluation and management by telemedicine and the availability of in person appointments. The patient expressed understanding and agreed to proceed.  History of Present Illness: Pt c/o new onset right knee pain and swelling, mod to severe, constant x 3 days since took a fall OOB directly to the knee; localized to the anterior knee near the knee cap, walking makes worse, sitting somewhat better.  Initial pain 10/10 now 6/10.  Pt denies chest pain, increased sob or doe, wheezing, orthopnea, PND, increased LE swelling, palpitations, dizziness or syncope.  Pt denies new neurological symptoms such as new headache, or facial or extremity weakness or numbness   Pt denies polydipsia, polyuria.   Left back pain from last visit now resolved.   Past Medical History:  Diagnosis Date  . Anemia    iron  . CHF (congestive heart failure) (HCC)    Presumed diastolic. Echo (06/07) w.EF 45%, severe posterior HK, mild LV hypertrophy, No further work-up of abnormal echo was done. pt denies CHF  . Colon cancer ([place]) 2007   s/p sigmoid colectomy  . Diabetes mellitus    Has Hx of diabetic foot ulcer & peripheral neuropathy  type2  . Dyspnea    w/ activity  . History of PFTs 05/2009   Mild Obstructive defect  . HTN (hypertension)   . Hyperlipidemia   . Morbid obesity ([place])   . OSA on CPAP   . Pneumonia    week ago    Past Surgical History:  Procedure Laterality Date  . BARIATRIC SURGERY  12/2009   Lap Band/At [facility]  . COLONOSCOPY  multiple  . COLONOSCOPY WITH PROPOFOL N/A 11/26/2012   Procedure: COLONOSCOPY  WITH PROPOFOL;  Surgeon: Gatha Mayer, MD;  Location: [facility];  Service: Endoscopy;  Laterality: N/A;  may need pre appt. with anesthesia due to morbid obesity  . COLONOSCOPY WITH PROPOFOL N/A 07/11/2017   Procedure: COLONOSCOPY WITH PROPOFOL;  Surgeon: Gatha Mayer, MD;  Location: [facility];  Service: Endoscopy;  Laterality: N/A;  . [place]   '80/Right  '84/Left  . EYE SURGERY Right 05/2017  . HERNIA REPAIR    . HIP SURGERY     bilateral  . INCISIONAL HERNIA REPAIR N/A 12/18/2013   Procedure:  REPAIR OF INCARCERATED INCISIONAL HERNIA;  Surgeon: Rolm Bookbinder, MD;  Location: [place];  Service: General;  Laterality: N/A;  . INSERTION OF MESH N/A 12/18/2013   Procedure: INSERTION OF MESH;  Surgeon: Rolm Bookbinder, MD;  Location: [place];  Service: General;  Laterality: N/A;  . Sigmoid Colectomy  10/2005   [place]  . TONSILLECTOMY      reports that he has never smoked. He has never used smokeless tobacco. He reports previous alcohol use. He reports that he does not use drugs. family history includes CAD in his mother; Congestive Heart Failure in his mother; Diabetes in his father and mother; Heart attack (age of onset: 39) in his brother; Heart failure in his brother; Hepatitis in his mother. Allergies  Allergen Reactions  . Shellfish Allergy Anaphylaxis  .  Lisinopril Cough   Current Outpatient Medications on File Prior to Visit  Medication Sig Dispense Refill  . albuterol (PROVENTIL HFA;VENTOLIN HFA) 108 (90 Base) MCG/ACT inhaler Inhale 2 puffs into the lungs every 6 (six) hours as needed for wheezing or shortness of breath. 1 Inhaler 1  . aspirin EC 81 MG tablet     . atorvastatin (LIPITOR) 40 MG tablet TAKE 1 TABLET BY MOUTH DAILY. 90 tablet 1  . bisoprolol (ZEBETA) 10 MG tablet TAKE 1 TABLET (10 MG TOTAL) BY MOUTH EVERY EVENING. (Patient taking differently: Take 40 mg by mouth daily. ) 90 tablet 1  . Cholecalciferol 2000 units TABS Take 1 tablet  (2,000 Units total) by mouth daily. 90 tablet 1  . Continuous Blood Gluc Receiver (FREESTYLE LIBRE 14 DAY READER) DEVI 1 each by Does not apply route every 14 (fourteen) days. 6 Device 1  . Continuous Blood Gluc Sensor (FREESTYLE LIBRE 14 DAY SENSOR) MISC 1 each by Does not apply route every 14 (fourteen) days. 6 each 1  . cyanocobalamin 2000 MCG tablet Take 1 tablet (2,000 mcg total) by mouth daily. 90 tablet 1  . cycloSPORINE (RESTASIS) 0.05 % ophthalmic emulsion Place 1 drop into both eyes 2 (two) times daily. (Patient taking differently: Place 1 drop into both eyes daily. ) 0.4 mL 5  . FEROSUL 325 (65 Fe) MG tablet TAKE 1 TABLET BY MOUTH 2 TIMES DAILY WITH A MEAL. (Patient taking differently: Take 325 mg by mouth 3 (three) times daily with meals. ) 180 tablet 1  . furosemide (LASIX) 80 MG tablet Take 1.5 tablets (120 mg total) by mouth every morning AND 1 tablet (80 mg total) at bedtime. 180 tablet 3  . gabapentin (NEURONTIN) 100 MG capsule Take 1 capsule (100 mg total) by mouth 3 (three) times daily. (Patient taking differently: Take 300 mg by mouth at bedtime. ) 90 capsule 5  . glucose blood (ONETOUCH VERIO) test strip USE  STRIP TO CHECK GLUCOSE THREE TIMES DAILY 100 each 5  . LEVEMIR FLEXTOUCH 100 UNIT/ML Pen INJECT 240 UNITS INTO THE SKIN EVERY MORNING. 75 mL 5  . levothyroxine (SYNTHROID, LEVOTHROID) 50 MCG tablet TAKE 1 TABLET BY MOUTH DAILY BEFORE BREAKFAST. 90 tablet 0  . linaclotide (LINZESS) 290 MCG CAPS capsule Take 1 capsule (290 mcg total) by mouth daily before breakfast. 90 capsule 1  . losartan (COZAAR) 25 MG tablet Take 2 tablets (50 mg total) by mouth daily. NEED FOLLOW UP APPT WITH DR [facility] [phone] 180 tablet 0  . Multiple Vitamins-Minerals (MULTIVITAMIN ADULT PO) Take by mouth.    Marland Kitchen omeprazole (PRILOSEC) 40 MG capsule Take 1 tablet by mouth 30 min before breakfast. 90 capsule 3  . phentermine 37.5 MG capsule Take 1 capsule (37.5 mg total) by mouth every morning. 30  capsule 0  . potassium chloride SA (K-DUR,KLOR-CON) 20 MEQ tablet Take 1 tablet (20 mEq total) by mouth 2 (two) times daily. 180 tablet 3  . SYNJARDY 01-999 MG TABS TAKE 1 TABLET BY MOUTH 2 (TWO) TIMES DAILY. 180 tablet 1  . thiamine (VITAMIN B-1) 100 MG tablet TAKE 1 TABLET BY MOUTH DAILY. 90 tablet 1  . ULTICARE SHORT PEN NEEDLES 31G X 8 MM MISC USE WITH INSULIN PENS DAILY. 100 each 3  . VENTOLIN HFA 108 (90 Base) MCG/ACT inhaler INHALE 2 PUFFS INTO THE LUNGS EVERY 6 HOURS AS NEEDED FOR WHEEZING OR SHORTNESS OF BREATH. 18 g 2  . vitamin B-6 (PYRIDOXINE) 25 MG tablet Take 1 tablet (25  mg total) by mouth daily. 90 tablet 1  . vitamin C (ASCORBIC ACID) 500 MG tablet Take 2 tablets (1,000 mg total) by mouth 2 (two) times daily. Reported on 01/19/2016 180 tablet 1  . Vitamins A & D (VITAMIN A & D) 10000-400 units TABS     . zinc gluconate 50 MG tablet Take 1 tablet (50 mg total) by mouth daily. Reported on 01/19/2016 (Patient not taking: Reported on 12/26/2018) 90 tablet 1   No current facility-administered medications on file prior to visit.    Observations/Objective: Not able due to phone only Lab Results  Component Value Date   WBC 7.8 11/22/2018   HGB 10.2 (L) 11/22/2018   HCT 32.4 (L) 11/22/2018   PLT 318.0 11/22/2018   GLUCOSE 47 (LL) 11/22/2018   CHOL 139 02/13/2018   TRIG 101.0 02/13/2018   HDL 29.40 (L) 02/13/2018   LDLDIRECT 136.8 12/12/2006   LDLCALC 90 02/13/2018   ALT 16 02/13/2018   AST 23 02/13/2018   NA 143 11/22/2018   K 4.0 11/22/2018   CL 102 11/22/2018   CREATININE 1.88 (H) 11/22/2018   BUN 30 (H) 11/22/2018   CO2 32 11/22/2018   TSH 2.84 06/01/2018   PSA 1.43 11/22/2018   HGBA1C 9.3 (A) 11/22/2018   MICROALBUR 1.0 06/01/2018   Assessment and Plan: See notes  Follow Up Instructions: See notes   I discussed the assessment and treatment plan with the patient. The patient was provided an opportunity to ask questions and all were answered. The patient agreed with  the plan and demonstrated an understanding of the instructions.   The patient was advised to call back or seek an in-person evaluation if the symptoms worsen or if the condition fails to improve as anticipated.  Cathlean Cower, MD  \

## 2019-02-01 NOTE — Telephone Encounter (Signed)
Appt scheduled

## 2019-02-01 NOTE — Progress Notes (Unsigned)
Patient ID: [name], male   DOB: 28-Dec-1955, 63 y.o.   MRN: [id number]  Virtual Visit via Video Note  I connected with [name] on 02/01/19 at  1:00 PM EDT by a video enabled telemedicine application and verified that I am speaking with the correct person using two identifiers.  Location: Patient: *** Provider: ***   I discussed the limitations of evaluation and management by telemedicine and the availability of in person appointments. The patient expressed understanding and agreed to proceed.  History of Present Illness:    Observations/Objective:   Assessment and Plan:   Follow Up Instructions:    I discussed the assessment and treatment plan with the patient. The patient was provided an opportunity to ask questions and all were answered. The patient agreed with the plan and demonstrated an understanding of the instructions.   The patient was advised to call back or seek an in-person evaluation if the symptoms worsen or if the condition fails to improve as anticipated.   Cathlean Cower, MD

## 2019-02-02 ENCOUNTER — Encounter: Payer: Self-pay | Admitting: Internal Medicine

## 2019-02-02 NOTE — Assessment & Plan Note (Signed)
S/p fall, cant r/o fracture vs prepatellar bursitis, for sport med referral on Monday

## 2019-02-02 NOTE — Assessment & Plan Note (Signed)
Fortunately resolved symptomatically,  to f/u any worsening symptoms or concerns

## 2019-02-02 NOTE — Assessment & Plan Note (Signed)
Mod uncontrolled with last a1c, encouraged pt for med compliance and f/u PCP

## 2019-02-02 NOTE — Assessment & Plan Note (Signed)
[place] for refill hydrocodone due to acute pain

## 2019-02-03 NOTE — Progress Notes (Deleted)
[name] [address], [place] [address] Phone: [phone] Subjective:    I'm seeing this patient by the request  of:   [name], MD   CC: knee pain swelling after fall  [id number]  [name] is a 63 y.o. male coming in with complaint of ***  Onset-  Location Duration-  Character- Aggravating factors- Reliving factors-  Therapies tried-  Severity-     Past Medical History:  Diagnosis Date  . Anemia    iron  . CHF (congestive heart failure) (HCC)    Presumed diastolic. Echo (06/07) w.EF 45%, severe posterior HK, mild LV hypertrophy, No further work-up of abnormal echo was done. pt denies CHF  . Colon cancer ([place]) 2007   s/p sigmoid colectomy  . Diabetes mellitus    Has Hx of diabetic foot ulcer & peripheral neuropathy  type2  . Dyspnea    w/ activity  . History of PFTs 05/2009   Mild Obstructive defect  . HTN (hypertension)   . Hyperlipidemia   . Morbid obesity ([place])   . OSA on CPAP   . Pneumonia    week ago    Past Surgical History:  Procedure Laterality Date  . BARIATRIC SURGERY  12/2009   Lap Band/At [facility]  . COLONOSCOPY  multiple  . COLONOSCOPY WITH PROPOFOL N/A 11/26/2012   Procedure: COLONOSCOPY WITH PROPOFOL;  Surgeon: Gatha Mayer, MD;  Location: [facility];  Service: Endoscopy;  Laterality: N/A;  may need pre appt. with anesthesia due to morbid obesity  . COLONOSCOPY WITH PROPOFOL N/A 07/11/2017   Procedure: COLONOSCOPY WITH PROPOFOL;  Surgeon: Gatha Mayer, MD;  Location: [facility];  Service: Endoscopy;  Laterality: N/A;  . [place]   '80/Right  '84/Left  . EYE SURGERY Right 05/2017  . HERNIA REPAIR    . HIP SURGERY     bilateral  . INCISIONAL HERNIA REPAIR N/A 12/18/2013   Procedure:  REPAIR OF INCARCERATED INCISIONAL HERNIA;  Surgeon: Rolm Bookbinder, MD;  Location: [facility];  Service: General;  Laterality: N/A;  . INSERTION OF MESH N/A 12/18/2013   Procedure:  INSERTION OF MESH;  Surgeon: Rolm Bookbinder, MD;  Location: [facility];  Service: General;  Laterality: N/A;  . Sigmoid Colectomy  10/2005   [facility]  . TONSILLECTOMY     Social History   Socioeconomic History  . Marital status: Legally Separated    Spouse name: Not on file  . Number of children: 0  . Years of education: Not on file  . Highest education level: Not on file  Occupational History  . Occupation: Engineering geologist  . Financial resource strain: Not on file  . Food insecurity:    Worry: Not on file    Inability: Not on file  . Transportation needs:    Medical: Not on file    Non-medical: Not on file  Tobacco Use  . Smoking status: Never Smoker  . Smokeless tobacco: Never Used  Substance and Sexual Activity  . Alcohol use: Not Currently    Alcohol/week: 0.0 standard drinks    Comment: states he does not drink   . Drug use: No  . Sexual activity: Not Currently  Lifestyle  . Physical activity:    Days per week: Not on file    Minutes per session: Not on file  . Stress: Not on file  Relationships  . Social connections:    Talks on phone:  Not on file    Gets together: Not on file    Attends religious service: Not on file    Active member of club or organization: Not on file    Attends meetings of clubs or organizations: Not on file    Relationship status: Not on file  Other Topics Concern  . 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   In new relationship (04/2009) - girlfriend helps taste for cooking business and helps monitor for ulcers.   Regular Exercise- No      Allergies  Allergen Reactions  . Shellfish Allergy Anaphylaxis  . Lisinopril Cough   Family History  Problem Relation Age of Onset  . Hepatitis Mother         hepatitis C  . Diabetes Mother   . Congestive Heart Failure Mother   . CAD Mother   . Heart attack Brother 60  . Diabetes Father   . Heart failure Brother   . Stomach cancer Neg Hx   . Colon cancer Neg Hx     Current Outpatient Medications (Endocrine & Metabolic):  Marland Kitchen  LEVEMIR FLEXTOUCH 100 UNIT/ML Pen, INJECT 240 UNITS INTO THE SKIN EVERY MORNING. Marland Kitchen  levothyroxine (SYNTHROID, LEVOTHROID) 50 MCG tablet, TAKE 1 TABLET BY MOUTH DAILY BEFORE BREAKFAST. .  SYNJARDY 01-999 MG TABS, TAKE 1 TABLET BY MOUTH 2 (TWO) TIMES DAILY.  Current Outpatient Medications (Cardiovascular):  .  atorvastatin (LIPITOR) 40 MG tablet, TAKE 1 TABLET BY MOUTH DAILY. .  bisoprolol (ZEBETA) 10 MG tablet, TAKE 1 TABLET (10 MG TOTAL) BY MOUTH EVERY EVENING. (Patient taking differently: Take 40 mg by mouth daily. ) .  furosemide (LASIX) 80 MG tablet, Take 1.5 tablets (120 mg total) by mouth every morning AND 1 tablet (80 mg total) at bedtime. Marland Kitchen  losartan (COZAAR) 25 MG tablet, Take 2 tablets (50 mg total) by mouth daily. NEED FOLLOW UP APPT WITH DR [facility] [phone]  Current Outpatient Medications (Respiratory):  .  albuterol (PROVENTIL HFA;VENTOLIN HFA) 108 (90 Base) MCG/ACT inhaler, Inhale 2 puffs into the lungs every 6 (six) hours as needed for wheezing or shortness of breath. .  VENTOLIN HFA 108 (90 Base) MCG/ACT inhaler, INHALE 2 PUFFS INTO THE LUNGS EVERY 6 HOURS AS NEEDED FOR WHEEZING OR SHORTNESS OF BREATH.  Current Outpatient Medications (Analgesics):  .  aspirin EC 81 MG tablet,  .  HYDROcodone-acetaminophen (NORCO) 10-325 MG tablet, Take 1 tablet by mouth every 8 (eight) hours as needed.  Current Outpatient Medications (Hematological):  .  cyanocobalamin 2000 MCG tablet, Take 1 tablet (2,000 mcg total) by mouth daily. .  FEROSUL 325 (65 Fe) MG tablet, TAKE 1 TABLET BY MOUTH 2 TIMES DAILY WITH A MEAL. (Patient taking differently: Take 325 mg by mouth 3 (three) times daily with meals. )  Current  Outpatient Medications (Other):  Marland Kitchen  Cholecalciferol 2000 units TABS, Take 1 tablet (2,000 Units total) by mouth daily. .  Continuous Blood Gluc Receiver (FREESTYLE LIBRE 14 DAY READER) DEVI, 1 each by Does not apply route every 14 (fourteen) days. .  Continuous Blood Gluc Sensor (FREESTYLE LIBRE 14 DAY SENSOR) MISC, 1 each by Does not apply route every 14 (fourteen) days. .  cycloSPORINE (RESTASIS) 0.05 %  ophthalmic emulsion, Place 1 drop into both eyes 2 (two) times daily. (Patient taking differently: Place 1 drop into both eyes daily. ) .  gabapentin (NEURONTIN) 100 MG capsule, Take 1 capsule (100 mg total) by mouth 3 (three) times daily. (Patient taking differently: Take 300 mg by mouth at bedtime. ) .  glucose blood (ONETOUCH VERIO) test strip, USE  STRIP TO CHECK GLUCOSE THREE TIMES DAILY .  linaclotide (LINZESS) 290 MCG CAPS capsule, Take 1 capsule (290 mcg total) by mouth daily before breakfast. .  Multiple Vitamins-Minerals (MULTIVITAMIN ADULT PO), Take by mouth. Marland Kitchen  omeprazole (PRILOSEC) 40 MG capsule, Take 1 tablet by mouth 30 min before breakfast. .  phentermine 37.5 MG capsule, Take 1 capsule (37.5 mg total) by mouth every morning. .  potassium chloride SA (K-DUR,KLOR-CON) 20 MEQ tablet, Take 1 tablet (20 mEq total) by mouth 2 (two) times daily. Marland Kitchen  thiamine (VITAMIN B-1) 100 MG tablet, TAKE 1 TABLET BY MOUTH DAILY. Marland Kitchen  ULTICARE SHORT PEN NEEDLES 31G X 8 MM MISC, USE WITH INSULIN PENS DAILY. .  vitamin B-6 (PYRIDOXINE) 25 MG tablet, Take 1 tablet (25 mg total) by mouth daily. .  vitamin C (ASCORBIC ACID) 500 MG tablet, Take 2 tablets (1,000 mg total) by mouth 2 (two) times daily. Reported on 01/19/2016 .  Vitamins A & D (VITAMIN A & D) 10000-400 units TABS,  .  zinc gluconate 50 MG tablet, Take 1 tablet (50 mg total) by mouth daily. Reported on 01/19/2016 (Patient not taking: Reported on 12/26/2018)    Past medical history, social, surgical and family history all reviewed in electronic  medical record.  No pertanent information unless stated regarding to the chief complaint.   Review of Systems:  No headache, visual changes, nausea, vomiting, diarrhea, constipation, dizziness, abdominal pain, skin rash, fevers, chills, night sweats, weight loss, swollen lymph nodes, body aches, joint swelling, muscle aches, chest pain, shortness of breath, mood changes.   Objective  There were no vitals taken for this visit. Systems examined below as of    General: No apparent distress alert and oriented x3 mood and affect normal, dressed appropriately.  HEENT: Pupils equal, extraocular movements intact  Respiratory: Patient's speak in full sentences and does not appear short of breath  Cardiovascular: No lower extremity edema, non tender, no erythema  Skin: Warm dry intact with no signs of infection or rash on extremities or on axial skeleton.  Abdomen: Soft nontender  Neuro: Cranial nerves II through XII are intact, neurovascularly intact in all extremities with 2+ DTRs and 2+ pulses.  Lymph: No lymphadenopathy of posterior or anterior cervical chain or axillae bilaterally.  Gait normal with good balance and coordination.  MSK:  Non tender with full range of motion and good stability and symmetric strength and tone of shoulders, elbows, wrist, hip, and ankles bilaterally.  Knee: Normal to inspection with no erythema or effusion or obvious bony abnormalities. Palpation normal with no warmth, joint line tenderness, patellar tenderness, or condyle tenderness. ROM full in flexion and extension and lower leg rotation. Ligaments with solid consistent endpoints including ACL, PCL, LCL, MCL. Negative Mcmurray's, Apley's, and Thessalonian tests. Non painful patellar compression. Patellar glide without crepitus. Patellar and quadriceps tendons unremarkable. Hamstring and quadriceps strength is normal.    MSK US performed of: *** This study was ordered, performed, and interpreted by Charlann Boxer  D.O.  Knee: All structures visualized. Anteromedial, anterolateral, posteromedial, and posterolateral menisci unremarkable without tearing, fraying, effusion, or displacement. Patellar Tendon unremarkable on  long and transverse views without effusion. No abnormality of prepatellar bursa. LCL and MCL unremarkable on long and transverse views. No abnormality of origin of medial or lateral head of the gastrocnemius.  IMPRESSION:  NORMAL ULTRASONOGRAPHIC EXAMINATION OF THE KNEE. Impression and Recommendations:     This case required medical decision making of moderate complexity. The above documentation has been reviewed and is accurate and complete Lyndal Pulley, DO       Note: This dictation was prepared with Dragon dictation along with smaller phrase technology. Any transcriptional errors that result from this process are unintentional.

## 2019-02-04 ENCOUNTER — Telehealth: Payer: Self-pay

## 2019-02-04 ENCOUNTER — Ambulatory Visit: Payer: Medicare HMO | Admitting: Family Medicine

## 2019-02-04 NOTE — Telephone Encounter (Signed)
Key: [id number]

## 2019-02-05 DIAGNOSIS — I110 Hypertensive heart disease with heart failure: Secondary | ICD-10-CM | POA: Diagnosis not present

## 2019-02-05 DIAGNOSIS — E1142 Type 2 diabetes mellitus with diabetic polyneuropathy: Secondary | ICD-10-CM | POA: Diagnosis not present

## 2019-02-05 DIAGNOSIS — L97513 Non-pressure chronic ulcer of other part of right foot with necrosis of muscle: Secondary | ICD-10-CM | POA: Diagnosis not present

## 2019-02-05 DIAGNOSIS — I5032 Chronic diastolic (congestive) heart failure: Secondary | ICD-10-CM | POA: Diagnosis not present

## 2019-02-05 DIAGNOSIS — D509 Iron deficiency anemia, unspecified: Secondary | ICD-10-CM | POA: Diagnosis not present

## 2019-02-05 DIAGNOSIS — Z794 Long term (current) use of insulin: Secondary | ICD-10-CM | POA: Diagnosis not present

## 2019-02-05 DIAGNOSIS — E11621 Type 2 diabetes mellitus with foot ulcer: Secondary | ICD-10-CM | POA: Diagnosis not present

## 2019-02-06 ENCOUNTER — Telehealth: Payer: Self-pay

## 2019-02-06 DIAGNOSIS — E118 Type 2 diabetes mellitus with unspecified complications: Secondary | ICD-10-CM

## 2019-02-06 MED ORDER — GLUCOSE BLOOD VI STRP: ORAL_STRIP | 12 refills | Status: DC

## 2019-02-06 MED ORDER — LANCETS MISC: 3 refills | Status: DC

## 2019-02-06 MED ORDER — BLOOD GLUCOSE MONITOR KIT: PACK | 0 refills | Status: DC

## 2019-02-06 MED ORDER — INSULIN DETEMIR 100 UNIT/ML FLEXPEN: 240.0000 [IU] | PEN_INJECTOR | Freq: Every day | SUBCUTANEOUS | 5 refills | Status: DC

## 2019-02-06 NOTE — Telephone Encounter (Signed)
PA was approved. Pt contacted and informed of same.

## 2019-02-06 NOTE — Telephone Encounter (Signed)
Pt needs refill for LEVEMIR FLEXTOUCH 100 UNIT/ML Pen Sent to pharmacy as well asap/ please advise

## 2019-02-06 NOTE — Telephone Encounter (Signed)
Copied from [place] [phone]. Topic: Quick Communication - Rx Refill/Question >> Feb 05, 2019 11:54 AM Alanda Slim E wrote: Medication: Diabetic testing supplies( lansix, strips, meter, swabs, solution)/ Pt would like the Acucheck Aviva brand. A fax was sent oto office on May 8th for the initial request. The will be re faxing orders today/ please advise

## 2019-02-06 NOTE — Telephone Encounter (Signed)
Order entered and faxed

## 2019-02-06 NOTE — Addendum Note (Signed)
Addended by: Karle Barr on: 02/06/2019 04:02 PM   Modules accepted: Orders

## 2019-02-06 NOTE — Telephone Encounter (Addendum)
erx sent.   Called pharmacy and informed PA has been approved.

## 2019-02-06 NOTE — Telephone Encounter (Signed)
[facility] called stating patient will be completely out of the LEVEMIR FLEXTOUCH 100 UNIT/ML Pen by Friday and a PA is needed due to the high dose.

## 2019-02-07 ENCOUNTER — Other Ambulatory Visit: Payer: Self-pay | Admitting: Neurology

## 2019-02-07 ENCOUNTER — Telehealth: Payer: Self-pay | Admitting: Neurology

## 2019-02-07 DIAGNOSIS — E66813 Obesity, class 3: Secondary | ICD-10-CM | POA: Insufficient documentation

## 2019-02-07 DIAGNOSIS — E1142 Type 2 diabetes mellitus with diabetic polyneuropathy: Secondary | ICD-10-CM

## 2019-02-07 DIAGNOSIS — Z6841 Body Mass Index (BMI) 40.0 and over, adult: Secondary | ICD-10-CM

## 2019-02-07 DIAGNOSIS — R0601 Orthopnea: Secondary | ICD-10-CM

## 2019-02-07 DIAGNOSIS — G4733 Obstructive sleep apnea (adult) (pediatric): Secondary | ICD-10-CM

## 2019-02-07 DIAGNOSIS — Z794 Long term (current) use of insulin: Secondary | ICD-10-CM | POA: Diagnosis not present

## 2019-02-07 DIAGNOSIS — E785 Hyperlipidemia, unspecified: Secondary | ICD-10-CM

## 2019-02-07 DIAGNOSIS — L97513 Non-pressure chronic ulcer of other part of right foot with necrosis of muscle: Secondary | ICD-10-CM | POA: Diagnosis not present

## 2019-02-07 DIAGNOSIS — E118 Type 2 diabetes mellitus with unspecified complications: Secondary | ICD-10-CM

## 2019-02-07 DIAGNOSIS — E662 Morbid (severe) obesity with alveolar hypoventilation: Secondary | ICD-10-CM | POA: Insufficient documentation

## 2019-02-07 DIAGNOSIS — I5032 Chronic diastolic (congestive) heart failure: Secondary | ICD-10-CM | POA: Diagnosis not present

## 2019-02-07 DIAGNOSIS — I110 Hypertensive heart disease with heart failure: Secondary | ICD-10-CM | POA: Diagnosis not present

## 2019-02-07 DIAGNOSIS — E11621 Type 2 diabetes mellitus with foot ulcer: Secondary | ICD-10-CM | POA: Diagnosis not present

## 2019-02-07 DIAGNOSIS — G4719 Other hypersomnia: Secondary | ICD-10-CM

## 2019-02-07 DIAGNOSIS — D509 Iron deficiency anemia, unspecified: Secondary | ICD-10-CM | POA: Diagnosis not present

## 2019-02-07 NOTE — Addendum Note (Signed)
Addended by: Larey Seat on: 02/07/2019 09:08 AM   Modules accepted: Orders

## 2019-02-07 NOTE — Procedures (Signed)
Patient Information     First Name: [name] Last Name: [name] ID: [id number]  Birth Date: [birth year] Age: 63 Gender: Male  Referring Provider: [name], MD BMI: 63.0 (W=465 lb, H=6' 0'')  Neck Circ.:  21 '' Epworth:  16   Sleep Study Information    Study Date: Jan 30, 2019 S/H/A Version: 004.004.004.004 / 4.1.1531 / 21  History: [name] was seen Dr. Scarlette Calico also by video connection on 18 December 2018.  The patient is super obese, and struggles with significant mobility issues, continued weight gain, social isolation at this time, his only regular exercise had been swimming .The pool is closed due to the coronavirus crisis. The patient states that while he lived in [place] he was originally diagnosed with obstructive sleep apnea (13 years ago).He has continuously used the same machine since initial diagnosis which broke about a year ago.  He has a comorbidities of diabetes mellitus, congestive heart failure chronic kidney disease, hypertension, super obesity, currently untreated obstructive sleep apnea, and some chronic pain issues.  He reports orthopnea and obesity hypoventilation.     Summary & Diagnosis:    This HST confirms a diagnosis of severest obstructive Sleep Apnea at an AHI of 70.9/h and great variability in heart rate between 36 and 112 bpm. There is likely a component of Obesity-Hypo-Ventilation, and CHF- There is a significant amount of time seen in hypoventilation- about 21 % of the total recorded time.  Recommendations:      CPAP or BiPAP is necessary for this degree of OSA. I will order an autotitration device for now, as I can't invite him for an attended sleep study and he is a high risk patient or Covid 19. Auto CPAP 7-20 cm water /3 cm EPR /interface of his choice and comfort/ heated humidity.   Electronically Signed: Larey Seat, MD  02-06-2019                Sleep Summary    Oxygen Saturation Statistics     Start Study Time: End  Study Time: Total Recording Time:  11:03:20 PM   6:13:00 AM   7 hrs, 9 min  Total Sleep Time % REM of Sleep Time:  5 hrs, 46 min 8.4    Mean: 91 Minimum: 51 Maximum: 99  Mean of Desaturations Nadirs (%):   88  Oxygen Desaturation. %:   4-9 10-20 >20 Total  Events Number Total   274  44 85.1 13.7  4 1.2  322 100.0  Oxygen Saturation: <90 <=88 <85 <80 <70  Duration (minutes): Sleep % 73.1 21.1 39.7 9.7 11.5 2.8 5.9 1.7 2.2 0.6     Respiratory Indices      Total Events REM NREM All Night  pRDI:  396  pAHI:  396 ODI:  322  pAHIc:  61  % CSR: 13.5 N/A N/A N/A N/A N/A N/A N/A N/A 70.9 70.9 57.6 10.9       Pulse Rate Statistics during Sleep (BPM)      Mean: 74 Minimum: 36 Maximum: 114    Indices are calculated using technically valid sleep time of  5 hrs, 35 min.  pRDI/pAHI are calculated using oxi desaturations ? 3% REM/NREM indices appear only if REM time >  30 min.  Body Position Statistics  Position Supine Prone Right Left Non-Supine  Sleep (min) 337.5 0.0 0.0 8.5 8.5  Sleep % 97.5 0.0 0.0 2.5 2.5  pRDI 70.0 N/A N/A N/A N/A  pAHI  70.0 N/A N/A N/A N/A  ODI 57.7 N/A N/A N/A N/A     Snoring Statistics Snoring Level (dB) >40 >50 >60 >70 >80 >Threshold (45)  Sleep (min) 219.1 49.3 12.1 0.0 0.0 81.2  Sleep % 63.3 14.2 3.5 0.0 0.0 23.5    Mean: 44 dB Sleep Stages Chart                                                                             pAHI=70.9                                                                                              Mild              Moderate                    Severe

## 2019-02-07 NOTE — Telephone Encounter (Signed)
-----   Message from Larey Seat, MD sent at 02/07/2019  9:08 AM EDT ----- This HST confirms a diagnosis of severest obstructive Sleep Apnea at an AHI of 70.9/h and great variability in heart rate between 36 and 112 bpm. There is likely a component of Obesity-Hypo-Ventilation, and CHF- There is a significant amount of time seen in hypoventilation- about 21 % of the total recorded time.  Recommendations:     CPAP or BiPAP is necessary for this degree of OSA. I will order an autotitration device for now, as I can't invite him for an attended sleep study and he is a high risk patient or Covid 19. Auto CPAP 7-20 cm water /3 cm EPR /interface of his choice and comfort/ heated humidity.   Electronically Signed: Larey Seat, MD  02-06-2019

## 2019-02-07 NOTE — Telephone Encounter (Signed)
I called pt. I advised pt that Dr. Brett Fairy reviewed their sleep study results and found that pt has severe sleep apnea. Dr. Brett Fairy recommends that pt should start auto CPAP 7-20 cm water pressure. I reviewed PAP compliance expectations with the pt. Pt is agreeable to starting a CPAP. I advised pt that an order will be sent to a DME, [facility], and [facility] will call the pt within about one week after they file with the pt's insurance. [facility] will show the pt how to use the machine, fit for masks, and troubleshoot the CPAP if needed. A follow up appt was made for insurance purposes with Dr. Brett Fairy on Aug 17,2020 at 2:30pm. Pt verbalized understanding to arrive 15 minutes early and bring their CPAP. A letter with all of this information in it will be mailed to the pt as a reminder. I verified with the pt that the address we have on file is correct. Pt verbalized understanding of results. Pt had no questions at this time but was encouraged to call back if questions arise. I have sent the order to [facility] and have received confirmation that they have received the order.

## 2019-02-12 ENCOUNTER — Other Ambulatory Visit: Payer: Self-pay | Admitting: Internal Medicine

## 2019-02-12 DIAGNOSIS — Z794 Long term (current) use of insulin: Secondary | ICD-10-CM | POA: Diagnosis not present

## 2019-02-12 DIAGNOSIS — E11621 Type 2 diabetes mellitus with foot ulcer: Secondary | ICD-10-CM | POA: Diagnosis not present

## 2019-02-12 DIAGNOSIS — E1142 Type 2 diabetes mellitus with diabetic polyneuropathy: Secondary | ICD-10-CM | POA: Diagnosis not present

## 2019-02-12 DIAGNOSIS — I110 Hypertensive heart disease with heart failure: Secondary | ICD-10-CM | POA: Diagnosis not present

## 2019-02-12 DIAGNOSIS — D509 Iron deficiency anemia, unspecified: Secondary | ICD-10-CM | POA: Diagnosis not present

## 2019-02-12 DIAGNOSIS — L97513 Non-pressure chronic ulcer of other part of right foot with necrosis of muscle: Secondary | ICD-10-CM | POA: Diagnosis not present

## 2019-02-12 DIAGNOSIS — I5032 Chronic diastolic (congestive) heart failure: Secondary | ICD-10-CM | POA: Diagnosis not present

## 2019-02-12 NOTE — Progress Notes (Signed)
[name] [place],  [address] Phone: [phone] Subjective:   I [name] am serving as a Education administrator for Dr. Hulan Saas.    CC: Right knee pain  CBJ:[id number]  [name] is a 63 y.o. male coming in with complaint of right knee pain. States that he fell off his bed. States that he can feel a "bump" on the lateral side of his knee.   Onset- 2 weeks Location- lateral Duration-  Character- sore  Aggravating factors-  Reliving factors-time has been the most helpful Therapies tried- heat  Severity-7 out of 10 so that it is getting better.  Has been able to move it on a more regular basis at this point.     Past Medical History:  Diagnosis Date  . Anemia    iron  . CHF (congestive heart failure) (HCC)    Presumed diastolic. Echo (06/07) w.EF 45%, severe posterior HK, mild LV hypertrophy, No further work-up of abnormal echo was done. pt denies CHF  . Colon cancer ([place]) 2007   s/p sigmoid colectomy  . Diabetes mellitus    Has Hx of diabetic foot ulcer & peripheral neuropathy  type2  . Dyspnea    w/ activity  . History of PFTs 05/2009   Mild Obstructive defect  . HTN (hypertension)   . Hyperlipidemia   . Morbid obesity ([place])   . OSA on CPAP   . Pneumonia    week ago    Past Surgical History:  Procedure Laterality Date  . BARIATRIC SURGERY  12/2009   Lap Band/At [facility]  . COLONOSCOPY  multiple  . COLONOSCOPY WITH PROPOFOL N/A 11/26/2012   Procedure: COLONOSCOPY WITH PROPOFOL;  Surgeon: Gatha Mayer, MD;  Location: [facility];  Service: Endoscopy;  Laterality: N/A;  may need pre appt. with anesthesia due to morbid obesity  . COLONOSCOPY WITH PROPOFOL N/A 07/11/2017   Procedure: COLONOSCOPY WITH PROPOFOL;  Surgeon: Gatha Mayer, MD;  Location: [facility];  Service: Endoscopy;  Laterality: N/A;  . [place]   '80/Right  '84/Left  . EYE SURGERY Right 05/2017  . HERNIA REPAIR    . HIP  SURGERY     bilateral  . INCISIONAL HERNIA REPAIR N/A 12/18/2013   Procedure:  REPAIR OF INCARCERATED INCISIONAL HERNIA;  Surgeon: Rolm Bookbinder, MD;  Location: [facility];  Service: General;  Laterality: N/A;  . INSERTION OF MESH N/A 12/18/2013   Procedure: INSERTION OF MESH;  Surgeon: Rolm Bookbinder, MD;  Location: [facility];  Service: General;  Laterality: N/A;  . Sigmoid Colectomy  10/2005   [place]  . TONSILLECTOMY     Social History   Socioeconomic History  . Marital status: Legally Separated    Spouse name: Not on file  . Number of children: 0  . Years of education: Not on file  . Highest education level: Not on file  Occupational History  . Occupation: Engineering geologist  . Financial resource strain: Not on file  . Food insecurity:    Worry: Not on file    Inability: Not on file  . Transportation needs:    Medical: Not on file    Non-medical: Not on file  Tobacco Use  . Smoking status: Never Smoker  . Smokeless tobacco: Never Used  Substance and Sexual Activity  . Alcohol use: Not Currently    Alcohol/week: 0.0 standard drinks    Comment: states he does not drink   .  Drug use: No  . Sexual activity: Not Currently  Lifestyle  . Physical activity:    Days per week: Not on file    Minutes per session: Not on file  . Stress: Not on file  Relationships  . Social connections:    Talks on phone: Not on file    Gets together: Not on file    Attends religious service: Not on file    Active member of club or organization: Not on file    Attends meetings of clubs or organizations: Not on file    Relationship status: Not on file  Other Topics Concern  . 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   Regular Exercise- No      Allergies  Allergen Reactions  . Shellfish Allergy Anaphylaxis  . Lisinopril Cough   Family History  Problem Relation Age of Onset  . Hepatitis Mother        hepatitis C  . Diabetes Mother   . Congestive Heart Failure Mother   . CAD Mother   . Heart attack Brother 66  . Diabetes Father   . Heart failure Brother   . Stomach cancer Neg Hx   . Colon cancer Neg Hx     Current Outpatient Medications (Endocrine & Metabolic):  Marland Kitchen  Insulin Detemir (LEVEMIR FLEXTOUCH) 100 UNIT/ML Pen, Inject 240 Units into the skin daily. Marland Kitchen  levothyroxine (SYNTHROID) 50 MCG tablet, TAKE 1 TABLET BY MOUTH DAILY BEFORE BREAKFAST. .  SYNJARDY 01-999 MG TABS, TAKE 1 TABLET BY MOUTH 2 (TWO) TIMES DAILY.  Current Outpatient Medications (Cardiovascular):  .  atorvastatin (LIPITOR) 40 MG tablet, TAKE 1 TABLET BY MOUTH DAILY. .  bisoprolol (ZEBETA) 10 MG tablet, TAKE 1 TABLET (10 MG TOTAL) BY MOUTH EVERY EVENING. (Patient taking differently: Take 40 mg by mouth daily. ) .  furosemide (LASIX) 80 MG tablet, Take 1.5 tablets (120 mg total) by mouth every morning AND 1 tablet (80 mg total) at bedtime. Marland Kitchen  losartan (COZAAR) 25 MG tablet, Take 2 tablets (50 mg total) by mouth daily. NEED FOLLOW UP APPT WITH DR [facility] [phone]  Current Outpatient Medications (Respiratory):  .  albuterol (PROVENTIL HFA;VENTOLIN HFA) 108 (90 Base) MCG/ACT inhaler, Inhale 2 puffs into the lungs every 6 (six) hours as needed for wheezing or shortness of breath. .  VENTOLIN HFA 108 (90 Base) MCG/ACT inhaler, INHALE 2 PUFFS INTO THE LUNGS EVERY 6 HOURS AS NEEDED FOR WHEEZING OR SHORTNESS OF BREATH.  Current Outpatient Medications (Analgesics):  .  aspirin EC 81 MG tablet,  .  HYDROcodone-acetaminophen (NORCO) 10-325 MG tablet, Take 1 tablet by mouth every 8 (eight) hours as needed.  Current Outpatient Medications (Hematological):  .   cyanocobalamin 2000 MCG tablet, Take 1 tablet (2,000 mcg total) by mouth daily. .  FEROSUL 325 (65 Fe) MG tablet, TAKE 1 TABLET BY MOUTH 2 TIMES DAILY WITH A MEAL. (Patient taking differently: Take 325 mg by mouth 3 (three) times daily with meals. )  Current Outpatient Medications (Other):  .  blood glucose meter kit and supplies KIT, Use to check blood sugar three times  daily. DX: E11.8 .  Cholecalciferol 2000 units TABS, Take 1 tablet (2,000 Units total) by mouth daily. .  cycloSPORINE (RESTASIS) 0.05 % ophthalmic emulsion, Place 1 drop into both eyes 2 (two) times daily. (Patient taking differently: Place 1 drop into both eyes daily. ) .  gabapentin (NEURONTIN) 100 MG capsule, Take 1 capsule (100 mg total) by mouth 3 (three) times daily. (Patient taking differently: Take 300 mg by mouth at bedtime. ) .  glucose blood (COOL BLOOD GLUCOSE TEST STRIPS) test strip, Use to check blood sugar three times daily. DX: E11.8 .  Lancets MISC, Use to check blood sugar three times daily. DX: E11.8 .  linaclotide (LINZESS) 290 MCG CAPS capsule, Take 1 capsule (290 mcg total) by mouth daily before breakfast. .  Multiple Vitamins-Minerals (MULTIVITAMIN ADULT PO), Take by mouth. Marland Kitchen  omeprazole (PRILOSEC) 40 MG capsule, Take 1 tablet by mouth 30 min before breakfast. .  phentermine 37.5 MG capsule, TAKE 1 CAPSULE BY MOUTH EVERY MORNING. Marland Kitchen  potassium chloride SA (K-DUR,KLOR-CON) 20 MEQ tablet, Take 1 tablet (20 mEq total) by mouth 2 (two) times daily. Marland Kitchen  thiamine (VITAMIN B-1) 100 MG tablet, TAKE 1 TABLET BY MOUTH DAILY. Marland Kitchen  ULTICARE SHORT PEN NEEDLES 31G X 8 MM MISC, USE WITH INSULIN PENS DAILY. .  vitamin B-6 (PYRIDOXINE) 25 MG tablet, Take 1 tablet (25 mg total) by mouth daily. .  vitamin C (ASCORBIC ACID) 500 MG tablet, Take 2 tablets (1,000 mg total) by mouth 2 (two) times daily. Reported on 01/19/2016 .  Vitamins A & D (VITAMIN A & D) 10000-400 units TABS,  .  zinc gluconate 50 MG tablet, Take 1 tablet (50 mg  total) by mouth daily. Reported on 01/19/2016    Past medical history, social, surgical and family history all reviewed in electronic medical record.  No pertanent information unless stated regarding to the chief complaint.   Review of Systems:  No headache, visual changes, nausea, vomiting, diarrhea, constipation, dizziness, abdominal pain, skin rash, fevers, chills, night sweats, weight loss, swollen lymph nodes, body aches,chest pain, shortness of breath, mood changes.  Positive muscle aches and joint swelling  Objective  Blood pressure 110/70, pulse 79, height 6' (1.829 m), SpO2 94 %.    General: No apparent distress alert and oriented x3 mood and affect normal, dressed appropriately.  Morbidly obese HEENT: Pupils equal, extraocular movements intact  Respiratory: Patient's speak in full sentences and does not appear short of breath  Cardiovascular: 3+ lower extremity edema, non tender, no erythema  Skin: Warm dry intact with no signs of infection or rash on extremities or on axial skeleton.  Abdomen: Soft nontender  Neuro: Cranial nerves II through XII are intact, neurovascularly intact in all extremities with 2+ DTRs and 2+ pulses.  Lymph: No lymphadenopathy of posterior or anterior cervical chain or axillae bilaterally.  Gait patient in a wheelchair MSK: Moderate arthritic changes.  Difficult to assess secondary to patient's body habitus  Right knee exam shows the patient does have trace swelling noted.  Patient is tender over the lateral aspect of the knee.  Full range of motion.  Mild crepitus.   Musculoskeletal ultrasound was performed and interpreted by Lyndal Pulley  Limited ultrasound does show the patient has contusions in the soft tissue.  Does not appear to go intra-articular.  Very small trace effusion of the patellofemoral joint noted and mild narrowing noted.      Impression and Recommendations:     This case required medical decision  making of moderate  complexity. The above documentation has been reviewed and is accurate and complete Lyndal Pulley, DO       Note: This dictation was prepared with Dragon dictation along with smaller phrase technology. Any transcriptional errors that result from this process are unintentional.

## 2019-02-13 ENCOUNTER — Other Ambulatory Visit: Payer: Self-pay

## 2019-02-13 ENCOUNTER — Ambulatory Visit: Payer: Self-pay

## 2019-02-13 ENCOUNTER — Ambulatory Visit (INDEPENDENT_AMBULATORY_CARE_PROVIDER_SITE_OTHER): Payer: Medicare HMO | Admitting: Family Medicine

## 2019-02-13 ENCOUNTER — Encounter: Payer: Self-pay | Admitting: Family Medicine

## 2019-02-13 VITALS — BP 110/70 | HR 79 | Ht 72.0 in

## 2019-02-13 DIAGNOSIS — M545 Low back pain, unspecified: Secondary | ICD-10-CM

## 2019-02-13 DIAGNOSIS — G8929 Other chronic pain: Secondary | ICD-10-CM | POA: Diagnosis not present

## 2019-02-13 DIAGNOSIS — M25561 Pain in right knee: Secondary | ICD-10-CM

## 2019-02-13 NOTE — Patient Instructions (Signed)
Good to see you  Heat 10 minutes with massage to the knee 3-4 times a day to break up the calcium Should do great overall  Vitamin D 2000 IU daily will help muscle strength and endurance They will call you on aquatic PT  See me again in 4-6 weeks if not much better  Stay safe

## 2019-02-13 NOTE — Assessment & Plan Note (Signed)
Seems to be more of a contusion to the knee.  Does not seem to be intra-articular at all at this time.  Do not see any type of signs of any infectious etiology.  Patient mostly has what appears to be very small amount of necrotic tissue of the soft tissue that is likely actually a contusion that is healing at this point.  Patient is to increase activity slowly.  We discussed which activities of doing which wants to avoid.  Follow-up again 4 weeks if not completely resolved

## 2019-02-14 DIAGNOSIS — I110 Hypertensive heart disease with heart failure: Secondary | ICD-10-CM | POA: Diagnosis not present

## 2019-02-14 DIAGNOSIS — L97513 Non-pressure chronic ulcer of other part of right foot with necrosis of muscle: Secondary | ICD-10-CM | POA: Diagnosis not present

## 2019-02-14 DIAGNOSIS — I5032 Chronic diastolic (congestive) heart failure: Secondary | ICD-10-CM | POA: Diagnosis not present

## 2019-02-14 DIAGNOSIS — E11621 Type 2 diabetes mellitus with foot ulcer: Secondary | ICD-10-CM | POA: Diagnosis not present

## 2019-02-14 DIAGNOSIS — E1142 Type 2 diabetes mellitus with diabetic polyneuropathy: Secondary | ICD-10-CM | POA: Diagnosis not present

## 2019-02-14 DIAGNOSIS — D509 Iron deficiency anemia, unspecified: Secondary | ICD-10-CM | POA: Diagnosis not present

## 2019-02-14 DIAGNOSIS — Z794 Long term (current) use of insulin: Secondary | ICD-10-CM | POA: Diagnosis not present

## 2019-02-15 DIAGNOSIS — I110 Hypertensive heart disease with heart failure: Secondary | ICD-10-CM | POA: Diagnosis not present

## 2019-02-15 DIAGNOSIS — I509 Heart failure, unspecified: Secondary | ICD-10-CM | POA: Diagnosis not present

## 2019-02-15 DIAGNOSIS — E1142 Type 2 diabetes mellitus with diabetic polyneuropathy: Secondary | ICD-10-CM | POA: Diagnosis not present

## 2019-02-15 DIAGNOSIS — L97512 Non-pressure chronic ulcer of other part of right foot with fat layer exposed: Secondary | ICD-10-CM | POA: Diagnosis not present

## 2019-02-15 DIAGNOSIS — G4730 Sleep apnea, unspecified: Secondary | ICD-10-CM | POA: Diagnosis not present

## 2019-02-15 DIAGNOSIS — E11621 Type 2 diabetes mellitus with foot ulcer: Secondary | ICD-10-CM | POA: Diagnosis not present

## 2019-02-15 DIAGNOSIS — S91101A Unspecified open wound of right great toe without damage to nail, initial encounter: Secondary | ICD-10-CM | POA: Diagnosis not present

## 2019-02-19 DIAGNOSIS — D509 Iron deficiency anemia, unspecified: Secondary | ICD-10-CM | POA: Diagnosis not present

## 2019-02-19 DIAGNOSIS — E11621 Type 2 diabetes mellitus with foot ulcer: Secondary | ICD-10-CM | POA: Diagnosis not present

## 2019-02-19 DIAGNOSIS — L97513 Non-pressure chronic ulcer of other part of right foot with necrosis of muscle: Secondary | ICD-10-CM | POA: Diagnosis not present

## 2019-02-19 DIAGNOSIS — E1142 Type 2 diabetes mellitus with diabetic polyneuropathy: Secondary | ICD-10-CM | POA: Diagnosis not present

## 2019-02-19 DIAGNOSIS — Z794 Long term (current) use of insulin: Secondary | ICD-10-CM | POA: Diagnosis not present

## 2019-02-19 DIAGNOSIS — I110 Hypertensive heart disease with heart failure: Secondary | ICD-10-CM | POA: Diagnosis not present

## 2019-02-19 DIAGNOSIS — I5032 Chronic diastolic (congestive) heart failure: Secondary | ICD-10-CM | POA: Diagnosis not present

## 2019-02-20 ENCOUNTER — Telehealth: Payer: Self-pay | Admitting: Internal Medicine

## 2019-02-20 NOTE — Telephone Encounter (Signed)
Called [facility] and they stated that the pharmacy started the new PA. Informed that I have spoke to pharmacy and corrected there day the dose is for. Pharmacy corrected.

## 2019-02-20 NOTE — Telephone Encounter (Signed)
Copied from [place] [id number]. Topic: Quick Communication - Rx Refill/Question >> Feb 20, 2019 12:14 PM [name] wrote: Medication: Insulin Detemir (LEVEMIR FLEXTOUCH) 100 UNIT/ML Pen  - PA needed/ PA request was faxed over from [facility] on 5.27.20 and 6.2.20. [name] will call back today to check on the PA if she hasnt received a call/ please advise  Ref# [id number]

## 2019-02-21 DIAGNOSIS — Z794 Long term (current) use of insulin: Secondary | ICD-10-CM | POA: Diagnosis not present

## 2019-02-21 DIAGNOSIS — I5032 Chronic diastolic (congestive) heart failure: Secondary | ICD-10-CM | POA: Diagnosis not present

## 2019-02-21 DIAGNOSIS — E11621 Type 2 diabetes mellitus with foot ulcer: Secondary | ICD-10-CM | POA: Diagnosis not present

## 2019-02-21 DIAGNOSIS — I110 Hypertensive heart disease with heart failure: Secondary | ICD-10-CM | POA: Diagnosis not present

## 2019-02-21 DIAGNOSIS — D509 Iron deficiency anemia, unspecified: Secondary | ICD-10-CM | POA: Diagnosis not present

## 2019-02-21 DIAGNOSIS — E1142 Type 2 diabetes mellitus with diabetic polyneuropathy: Secondary | ICD-10-CM | POA: Diagnosis not present

## 2019-02-21 DIAGNOSIS — L97513 Non-pressure chronic ulcer of other part of right foot with necrosis of muscle: Secondary | ICD-10-CM | POA: Diagnosis not present

## 2019-02-22 ENCOUNTER — Other Ambulatory Visit: Payer: Self-pay

## 2019-02-22 ENCOUNTER — Encounter (HOSPITAL_BASED_OUTPATIENT_CLINIC_OR_DEPARTMENT_OTHER): Payer: Medicare HMO | Attending: Internal Medicine

## 2019-02-22 DIAGNOSIS — E1142 Type 2 diabetes mellitus with diabetic polyneuropathy: Secondary | ICD-10-CM | POA: Diagnosis not present

## 2019-02-22 DIAGNOSIS — Z09 Encounter for follow-up examination after completed treatment for conditions other than malignant neoplasm: Secondary | ICD-10-CM | POA: Insufficient documentation

## 2019-02-22 DIAGNOSIS — S91101A Unspecified open wound of right great toe without damage to nail, initial encounter: Secondary | ICD-10-CM | POA: Diagnosis not present

## 2019-02-22 DIAGNOSIS — Z8631 Personal history of diabetic foot ulcer: Secondary | ICD-10-CM | POA: Diagnosis not present

## 2019-02-25 ENCOUNTER — Ambulatory Visit: Payer: Self-pay | Admitting: *Deleted

## 2019-02-25 NOTE — Telephone Encounter (Signed)
R hand- pain in palm and can't make fist. Patient reports it started Friday and has gotten any better. Patient can grasp and hold things- but he is having pain. Patient states he started with the thumb- and has traveled to palm. Patient was at wound care on Friday and was given antibiotic- he states it is not helping.  Patient states he is having a hard time having a BM- he is having pain in the abdomin. Patient does have history of hernia. Patient is having to strain to move the bowels- he is having pain with the hernia. Last BM- last night- patient is staining- causing pain with hernia. Patient feels he is straining with urination too. Patient is unable to tell me what kind of hernia he has- he states he has not been using his stool softener- but states his stool is not abnormally hard.    Call to office to discuss if patient can be seen- verses sending him to ED- request send note for PCP review  Reason for Disposition . MODERATE hand swelling (e.g., visible swelling of hand and fingers; pitting edema)  Answer Assessment - Initial Assessment Questions 1. ONSET: "When did the swelling start?" (e.g., minutes, hours, days)     Friday 2. LOCATION: "What part of the hand is swollen?"  "Are both hands swollen or just one hand?"     Fingers and nuckles and palm- Right hand 3. SEVERITY: "How bad is the swelling?" (e.g., localized; mild, moderate, severe)   - BALL OR LUMP: small ball or lump   - LOCALIZED: puffy or swollen area or patch of skin   - JOINT SWELLING: swelling of a joint   - MILD: puffiness or mild swelling of fingers or hand   - MODERATE: fingers and hand are swollen   - SEVERE: swelling of entire hand and up into forearm     moderate 4. REDNESS: "Does the swelling look red or infected?"     No redness 5. PAIN: "Is the swelling painful to touch?" If so, ask: "How painful is it?"   (Scale 1-10; mild, moderate or severe)     Painful- 8-9 6. FEVER: "Do you have a fever?" If so, ask:  "What is it, how was it measured, and when did it start?"      No fever 7. CAUSE: "What do you think is causing the hand swelling?" (e.g., heat, insect bite, pregnancy, recent injury)     No sure 8. MEDICAL HISTORY: "Do you have a history of heart failure, kidney disease, liver failure, or cancer?"     Heart disease 9. RECURRENT SYMPTOM: "Have you had hand swelling before?" If so, ask: "When was the last time?" "What happened that time?"     No  10. OTHER SYMPTOMS: "Do you have any other symptoms?" (e.g., blurred vision, difficulty breathing, headache)       no 11. PREGNANCY: "Is there any chance you are pregnant?" "When was your last menstrual period?"       n/a  Protocols used: HAND [facility]

## 2019-02-26 ENCOUNTER — Other Ambulatory Visit: Payer: Self-pay

## 2019-02-26 DIAGNOSIS — Z794 Long term (current) use of insulin: Secondary | ICD-10-CM | POA: Diagnosis not present

## 2019-02-26 DIAGNOSIS — L97513 Non-pressure chronic ulcer of other part of right foot with necrosis of muscle: Secondary | ICD-10-CM | POA: Diagnosis not present

## 2019-02-26 DIAGNOSIS — E1142 Type 2 diabetes mellitus with diabetic polyneuropathy: Secondary | ICD-10-CM | POA: Diagnosis not present

## 2019-02-26 DIAGNOSIS — E11621 Type 2 diabetes mellitus with foot ulcer: Secondary | ICD-10-CM | POA: Diagnosis not present

## 2019-02-26 DIAGNOSIS — I110 Hypertensive heart disease with heart failure: Secondary | ICD-10-CM | POA: Diagnosis not present

## 2019-02-26 DIAGNOSIS — D509 Iron deficiency anemia, unspecified: Secondary | ICD-10-CM | POA: Diagnosis not present

## 2019-02-26 DIAGNOSIS — I5032 Chronic diastolic (congestive) heart failure: Secondary | ICD-10-CM | POA: Diagnosis not present

## 2019-02-26 NOTE — Patient Outreach (Signed)
[facility]) Care Management  02/26/2019  Javoris Star [birth year] [phone]    1st unsuccessful outreach to the patient for assessment.  No answer.  HIPAA compliant voicemail left with contact information.  Plan: RN Health Coach will send letter. [place] will make outreach attempt to the patient within thirty business days.   Lazaro Arms RN, BSN, [place] Direct Dial:  [phone]  Fax: [phone]

## 2019-03-06 DIAGNOSIS — E1142 Type 2 diabetes mellitus with diabetic polyneuropathy: Secondary | ICD-10-CM | POA: Diagnosis not present

## 2019-03-06 DIAGNOSIS — I110 Hypertensive heart disease with heart failure: Secondary | ICD-10-CM | POA: Diagnosis not present

## 2019-03-06 DIAGNOSIS — I5032 Chronic diastolic (congestive) heart failure: Secondary | ICD-10-CM | POA: Diagnosis not present

## 2019-03-06 DIAGNOSIS — E11621 Type 2 diabetes mellitus with foot ulcer: Secondary | ICD-10-CM | POA: Diagnosis not present

## 2019-03-06 DIAGNOSIS — Z794 Long term (current) use of insulin: Secondary | ICD-10-CM | POA: Diagnosis not present

## 2019-03-06 DIAGNOSIS — L97513 Non-pressure chronic ulcer of other part of right foot with necrosis of muscle: Secondary | ICD-10-CM | POA: Diagnosis not present

## 2019-03-06 DIAGNOSIS — D509 Iron deficiency anemia, unspecified: Secondary | ICD-10-CM | POA: Diagnosis not present

## 2019-03-11 ENCOUNTER — Other Ambulatory Visit: Payer: Self-pay | Admitting: Internal Medicine

## 2019-03-11 DIAGNOSIS — G4733 Obstructive sleep apnea (adult) (pediatric): Secondary | ICD-10-CM | POA: Diagnosis not present

## 2019-03-14 ENCOUNTER — Other Ambulatory Visit: Payer: Self-pay

## 2019-03-14 NOTE — Patient Outreach (Signed)
[facility]) Care Management  03/14/2019  Jarquez Mestre 1956/08/15 [id number]    2nd attempt to outreach the patient for assessment.  No answer HIPAA compliant voicemail left with contact information.  Plan: RN Health Coach will make outreach attempt to the patient within thirty business  days.  Lazaro Arms RN, BSN, [place] Direct Dial:  [phone]  Fax: [phone]

## 2019-03-14 NOTE — Patient Outreach (Signed)
[facility]) Care Management  03/14/2019  [name] Jan 20, 1956 [id number]    Return patient's call from message left on the answering machine.  No answer.  Left HIPAA compliant voicemail.  Plan:  RN Health Coach will outreach the patient at the next scheduled interval.  Lazaro Arms RN, BSN, [place]:  [phone]  Fax: [phone]

## 2019-03-19 ENCOUNTER — Ambulatory Visit (HOSPITAL_COMMUNITY)
Admission: RE | Admit: 2019-03-19 | Discharge: 2019-03-19 | Disposition: A | Discharge status: Home / Self Care | Payer: Medicare HMO | Source: Ambulatory Visit | Attending: Cardiology | Admitting: Cardiology

## 2019-03-19 ENCOUNTER — Other Ambulatory Visit: Payer: Self-pay

## 2019-03-19 DIAGNOSIS — I5032 Chronic diastolic (congestive) heart failure: Secondary | ICD-10-CM

## 2019-03-19 NOTE — Patient Instructions (Addendum)
Referral to [facility]:  Karin Lieu [phone] to attend bariatric seminar.  They will walk you through steps of having weight loss surgery.  Labs 03/20/19 1130a We will only contact you if something comes back abnormal or we need to make some changes. Otherwise no news is good news!  Your physician recommends that you schedule a follow-up appointment in: 3 months with Dr. Aundra Dubin. You will get a separate phone call to arrange this appointment.

## 2019-03-19 NOTE — Progress Notes (Addendum)
Spoke with patient, AVS discussed. Lab appt made for tomorrow.  Printed AVS will be provided at the visit time. Verbalized understanding.   [facility] as patient reffered by Dr Aundra Dubin.  Per rep, they state referral not needed. Pt need to attend bariatric seminar.  Phone number provided on AVS for patient to call and arrange attending seminar.

## 2019-03-20 ENCOUNTER — Ambulatory Visit (INDEPENDENT_AMBULATORY_CARE_PROVIDER_SITE_OTHER): Payer: Medicare HMO | Admitting: Family Medicine

## 2019-03-20 ENCOUNTER — Other Ambulatory Visit: Payer: Self-pay

## 2019-03-20 ENCOUNTER — Encounter: Payer: Self-pay | Admitting: Family Medicine

## 2019-03-20 ENCOUNTER — Other Ambulatory Visit (HOSPITAL_COMMUNITY): Payer: Medicare HMO

## 2019-03-20 VITALS — BP 90/60 | Ht 72.0 in

## 2019-03-20 DIAGNOSIS — M25561 Pain in right knee: Secondary | ICD-10-CM | POA: Diagnosis not present

## 2019-03-20 DIAGNOSIS — G8929 Other chronic pain: Secondary | ICD-10-CM | POA: Diagnosis not present

## 2019-03-20 NOTE — Patient Instructions (Signed)
Good to see you  Ice is your friend  We will get you in for aquatic therapy  See me again when you need me

## 2019-03-20 NOTE — Assessment & Plan Note (Signed)
Improved at this time.  I do want to help with patient having increasing range of motion and his other arthritic changes.  Patient will be referred to aquatic therapy which I think will be more beneficial.  Patient is to increase activity slowly.  Follow-up again 4 to 8 weeks.

## 2019-03-20 NOTE — Progress Notes (Signed)
Heart Failure TeleHealth Note  Due to national recommendations of social distancing due to [place] 19, Audio/video telehealth visit is felt to be most appropriate for this patient at this time.  See MyChart message from today for patient consent regarding telehealth for [facility].  Date:  03/20/2019   ID:  [name], DOB 1955-12-21, MRN [id number]  Location: Home  Provider location: [place] [facility] Type of Visit: Established patient   PCP:  [name], MD  Cardiologist:  Dr. Aundra Dubin  Chief Complaint: Exertional shortness of breath   History of Present Illness: [name] is a 63 y.o. male who presents via audio/video conferencing for a telehealth visit today.     he denies symptoms worrisome for COVID 19.   Patient has history of morbid obesity, diabetes, and diastolic CHF.   He still has limited mobility due to size and uses a rolling walker in the house and a power chair when he goes out. Breathing is better on higher dose of Lasix, "clothes are looser."  He is not short of breath walking around the house though mobility is limited due to body habitus. He is using his CPAP.  Occasional cough.   He is being evaluated to have lap band out and to get gastric sleeve.  This has been postponed by coronavirus and he has not heard anything from [facility] about rescheduling.    Labs (8/10): creatinine 1.1, LDL 94, HDL 34 Labs (8/11): K 4.8, creatinine 1.2, LDL 93, HDL 33 Labs (10/12): K 5.4, creatinine 1.5, LDL 95, HDL 42 Labs (8/14): LDL 115, HDL 42 Labs (9/14): BNP 68 Labs (10/14): K 4.5, creatinine 1.6 Labs (7/15): K 4.4, creatinine 1.3 Labs (8/15): LDL 136, HDL 37 Labs (11/17): K 4.6, creatinine 1.43, LDL 111, HDL 37 Labs (4/18): K 4.9, creatinine 1.35, hgb 10.5 Labs (1/19): creatinine 2.56 Labs (3/20): K 4, creatinine 1.88, hgb 10.2, HIV negative  Past Medical History: 1. Diabetes mellitus.  Has history of diabetic foot ulcer  and diabetic peripheral neuropathy. .  2. Hyperlipidemia 3. Hypertension: ACEI cough.  4. Colon cancer: s/p sigmoid colectomy in 2007 5. Morbid obesity s/p lap band procedure.  6. Anemia 7. Obstructive Sleep Apnea: Using CPAP.  8. CHF: Diastolic.  Echo (5/12): EF 50-55%, mild LV dilation, mild LVH, moderate (grade II) diastolic dysfunction.  Echo (9/14) with EF 50-55%, mild LVH, moderately dilated LV, RV poorly visualized. - Echo (6/19): EF 55-60%, RV poorly visualized.  9. PFTs (9/10): mild obstructive defect 10.  Dobutamine stress echo (9/10): EF 55%, mild LV hypertrophy, no significant valvular disease, no ischemia or infarction  11. CKD stage 3 12. Ventral hernia s/p repair.   Current Outpatient Medications  Medication Sig Dispense Refill  . albuterol (PROVENTIL HFA;VENTOLIN HFA) 108 (90 Base) MCG/ACT inhaler Inhale 2 puffs into the lungs every 6 (six) hours as needed for wheezing or shortness of breath. 1 Inhaler 1  . aspirin EC 81 MG tablet     . atorvastatin (LIPITOR) 40 MG tablet TAKE 1 TABLET BY MOUTH DAILY. 90 tablet 1  . bisoprolol (ZEBETA) 10 MG tablet TAKE 1 TABLET (10 MG TOTAL) BY MOUTH EVERY EVENING. (Patient taking differently: Take 40 mg by mouth daily. ) 90 tablet 1  . blood glucose meter kit and supplies KIT Use to check blood sugar three times daily. DX: E11.8 1 each 0  . Cholecalciferol 2000 units TABS Take 1 tablet (2,000 Units total) by mouth daily. 90 tablet  1  . cyanocobalamin 2000 MCG tablet Take 1 tablet (2,000 mcg total) by mouth daily. 90 tablet 1  . cycloSPORINE (RESTASIS) 0.05 % ophthalmic emulsion Place 1 drop into both eyes 2 (two) times daily. (Patient taking differently: Place 1 drop into both eyes daily. ) 0.4 mL 5  . FEROSUL 325 (65 Fe) MG tablet TAKE 1 TABLET BY MOUTH 2 TIMES DAILY WITH A MEAL. (Patient taking differently: Take 325 mg by mouth 3 (three) times daily with meals. ) 180 tablet 1  . furosemide (LASIX) 80 MG tablet Take 1.5 tablets (120 mg  total) by mouth every morning AND 1 tablet (80 mg total) at bedtime. 180 tablet 3  . gabapentin (NEURONTIN) 100 MG capsule Take 1 capsule (100 mg total) by mouth 3 (three) times daily. (Patient taking differently: Take 300 mg by mouth at bedtime. ) 90 capsule 5  . glucose blood (COOL BLOOD GLUCOSE TEST STRIPS) test strip Use to check blood sugar three times daily. DX: E11.8 100 each 12  . HYDROcodone-acetaminophen (NORCO) 10-325 MG tablet Take 1 tablet by mouth every 8 (eight) hours as needed. 30 tablet 0  . Insulin Detemir (LEVEMIR FLEXTOUCH) 100 UNIT/ML Pen Inject 240 Units into the skin daily. 75 mL 5  . Lancets MISC Use to check blood sugar three times daily. DX: E11.8 300 each 3  . levothyroxine (SYNTHROID) 50 MCG tablet TAKE 1 TABLET BY MOUTH DAILY BEFORE BREAKFAST. 90 tablet 0  . linaclotide (LINZESS) 290 MCG CAPS capsule Take 1 capsule (290 mcg total) by mouth daily before breakfast. 90 capsule 1  . losartan (COZAAR) 25 MG tablet Take 2 tablets (50 mg total) by mouth daily. NEED FOLLOW UP APPT WITH DR [facility] [phone] 180 tablet 0  . Multiple Vitamins-Minerals (MULTIVITAMIN ADULT PO) Take by mouth.    Marland Kitchen omeprazole (PRILOSEC) 40 MG capsule Take 1 tablet by mouth 30 min before breakfast. 90 capsule 3  . phentermine 37.5 MG capsule TAKE 1 CAPSULE BY MOUTH EVERY MORNING. 30 capsule 0  . potassium chloride SA (K-DUR,KLOR-CON) 20 MEQ tablet Take 1 tablet (20 mEq total) by mouth 2 (two) times daily. 180 tablet 3  . SYNJARDY 01-999 MG TABS TAKE 1 TABLET BY MOUTH 2 (TWO) TIMES DAILY. 180 tablet 1  . thiamine (VITAMIN B-1) 100 MG tablet TAKE 1 TABLET BY MOUTH DAILY. 90 tablet 1  . ULTICARE SHORT PEN NEEDLES 31G X 8 MM MISC USE WITH INSULIN PENS DAILY. 100 each 3  . VENTOLIN HFA 108 (90 Base) MCG/ACT inhaler INHALE 2 PUFFS INTO THE LUNGS EVERY 6 HOURS AS NEEDED FOR WHEEZING OR SHORTNESS OF BREATH. 18 g 2  . vitamin B-6 (PYRIDOXINE) 25 MG tablet Take 1 tablet (25 mg total) by mouth daily. 90 tablet 1   . vitamin C (ASCORBIC ACID) 500 MG tablet Take 2 tablets (1,000 mg total) by mouth 2 (two) times daily. Reported on 01/19/2016 180 tablet 1  . Vitamins A & D (VITAMIN A & D) 10000-400 units TABS     . zinc gluconate 50 MG tablet Take 1 tablet (50 mg total) by mouth daily. Reported on 01/19/2016 90 tablet 1   No current facility-administered medications for this encounter.     Allergies:   Shellfish allergy and Lisinopril   Social History:  The patient  reports that he has never smoked. He has never used smokeless tobacco. He reports previous alcohol use. He reports that he does not use drugs.   Family History:  The patient's family history  includes CAD in his mother; Congestive Heart Failure in his mother; Diabetes in his father and mother; Heart attack (age of onset: 49) in his brother; Heart failure in his brother; Hepatitis in his mother.   ROS:  Please see the history of present illness.   All other systems are personally reviewed and negative.   Exam:  (Video/Tele Health Call; Exam is subjective and or/visual.) General: Speaks in full sentences, no respiratory difficulty.  Lungs: Normal respiratory effort with conversation.  Abdomen: Non-distended and nontender per patient report . Extremities: Trace ankle edema.  Neuro: Alert/oriented x 3.    Recent Labs: 06/01/2018: TSH 2.84 11/22/2018: BUN 30; Creatinine, Ser 1.88; Hemoglobin 10.2; Platelets 318.0; Potassium 4.0; Sodium 143  Personally reviewed   Wt Readings from Last 3 Encounters:  11/22/18 (!) 210.9 kg (465 lb)  05/31/18 (!) 208.1 kg (458 lb 12 oz)  02/08/18 (!) 216.4 kg (477 lb)      ASSESSMENT AND PLAN:  1. Chronic diastolic CHF:  Breathing is better on higher dose of Lasix.  Decreased peripheral edema. - Continue Lasix 120 mg qam/80 mg qpm.  - Continue KCl 20 bid.  - I will arrange for BMET.   2. Obesity: Weight loss is imperative but will be difficult given his limited mobility.  He has been evaluated for removal of  lap band and performance of gastric sleeve but this has been postponed by coronavirus => I will refer him back to [facility] for evaluation. 3. HTN: BP controlled.  4. OSA: Now using CPAP.   COVID screen The patient does not have any symptoms that suggest any further testing/ screening at this time.  Social distancing reinforced today.  Relevant cardiac medications were reviewed at length with the patient today.   The patient does not have concerns regarding their medications at this time.   Recommended follow-up:  3 months in office.   Today, I have spent 18 minutes with the patient with telehealth technology discussing the above issues .    Signed, [name], MD  03/20/2019   [facility] [address] [facility] [place] [address] [phone] (office) [phone] (fax)

## 2019-03-20 NOTE — Progress Notes (Signed)
[name] [place],  [address] Phone: [phone] Subjective:   [name], am serving as a scribe for Dr. Hulan Saas.  I'm seeing this patient by the request  of:    CC: Pain and back pain follow-up  BSJ:[id number]   02/13/2019: Seems to be more of a contusion to the knee.  Does not seem to be intra-articular at all at this time.  Do not see any type of signs of any infectious etiology.  Patient mostly has what appears to be very small amount of necrotic tissue of the soft tissue that is likely actually a contusion that is healing at this point.  Patient is to increase activity slowly.  We discussed which activities of doing which wants to avoid.  Follow-up again 4 weeks if not completely resolved  Update 03/20/2019: [name] is a 63 y.o. male coming in with complaint of right knee pain. Patient states that his pain has decreased since last visit. Still notes a knot on the knee.  Patient is still having tightness of muscle places in his body.  States that the knee does seem to be doing better.     Past Medical History:  Diagnosis Date  . Anemia    iron  . CHF (congestive heart failure) (HCC)    Presumed diastolic. Echo (06/07) w.EF 45%, severe posterior HK, mild LV hypertrophy, [name] further work-up of abnormal echo was done. pt denies CHF  . Colon cancer ([place]) 2007   s/p sigmoid colectomy  . Diabetes mellitus    Has Hx of diabetic foot ulcer & peripheral neuropathy  type2  . Dyspnea    w/ activity  . History of PFTs 05/2009   Mild Obstructive defect  . HTN (hypertension)   . Hyperlipidemia   . Morbid obesity ([place])   . OSA on CPAP   . Pneumonia    week ago    Past Surgical History:  Procedure Laterality Date  . BARIATRIC SURGERY  12/2009   Lap Band/At [facility]  . COLONOSCOPY  multiple  . COLONOSCOPY WITH PROPOFOL N/A 11/26/2012   Procedure: COLONOSCOPY WITH PROPOFOL;  Surgeon: Gatha Mayer, MD;  Location: [facility]  ENDOSCOPY;  Service: Endoscopy;  Laterality: N/A;  may need pre appt. with anesthesia due to morbid obesity  . COLONOSCOPY WITH PROPOFOL N/A 07/11/2017   Procedure: COLONOSCOPY WITH PROPOFOL;  Surgeon: Gatha Mayer, MD;  Location: [facility] ENDOSCOPY;  Service: Endoscopy;  Laterality: N/A;  . [place]   '80/Right  '84/Left  . EYE SURGERY Right 05/2017  . HERNIA REPAIR    . HIP SURGERY     bilateral  . INCISIONAL HERNIA REPAIR N/A 12/18/2013   Procedure:  REPAIR OF INCARCERATED INCISIONAL HERNIA;  Surgeon: Rolm Bookbinder, MD;  Location: [place];  Service: General;  Laterality: N/A;  . INSERTION OF MESH N/A 12/18/2013   Procedure: INSERTION OF MESH;  Surgeon: Rolm Bookbinder, MD;  Location: [place];  Service: General;  Laterality: N/A;  . Sigmoid Colectomy  10/2005   [place]  . TONSILLECTOMY     Social History   Socioeconomic History  . Marital status: Legally Separated    Spouse name: Not on file  . Number of children: 0  . Years of education: Not on file  . Highest education level: Not on file  Occupational History  . Occupation: Engineering geologist  . Financial resource strain: Not on file  . Food  insecurity    Worry: Not on file    Inability: Not on file  . Transportation needs    Medical: Not on file    Non-medical: Not on file  Tobacco Use  . Smoking status: Never Smoker  . Smokeless tobacco: Never Used  Substance and Sexual Activity  . Alcohol use: Not Currently    Alcohol/week: 0.0 standard drinks    Comment: states he does not drink   . Drug use: [name]  . Sexual activity: Not Currently  Lifestyle  . Physical activity    Days per week: Not on file    Minutes per session: Not on file  . Stress: Not on file  Relationships  . Social Herbalist on phone: Not on file    Gets together: Not on file    Attends religious service: Not on file    Active member of club or organization: Not on file    Attends meetings of clubs or  organizations: Not on file    Relationship status: Not on file  Other Topics Concern  . 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   In new relationship (04/2009) - girlfriend helps taste for cooking business and helps monitor for ulcers.   Regular Exercise- [name]      Allergies  Allergen Reactions  . Shellfish Allergy Anaphylaxis  . Lisinopril Cough   Family History  Problem Relation Age of Onset  . Hepatitis Mother        hepatitis C  . Diabetes Mother   . Congestive Heart Failure Mother   . CAD Mother   . Heart attack Brother 29  . Diabetes Father   . Heart failure Brother   . Stomach cancer Neg Hx   . Colon cancer Neg Hx     Current Outpatient Medications (Endocrine & Metabolic):  Marland Kitchen  Insulin Detemir (LEVEMIR FLEXTOUCH) 100 UNIT/ML Pen, Inject 240 Units into the skin daily. Marland Kitchen  levothyroxine (SYNTHROID) 50 MCG tablet, TAKE 1 TABLET BY MOUTH DAILY BEFORE BREAKFAST. .  SYNJARDY 01-999 MG TABS, TAKE 1 TABLET BY MOUTH 2 (TWO) TIMES DAILY.  Current Outpatient Medications (Cardiovascular):  .  atorvastatin (LIPITOR) 40 MG tablet, TAKE 1 TABLET BY MOUTH DAILY. .  bisoprolol (ZEBETA) 10 MG tablet, TAKE 1 TABLET (10 MG TOTAL) BY MOUTH EVERY EVENING. (Patient taking differently: Take 40 mg by mouth daily. ) .  furosemide (LASIX) 80 MG tablet, Take 1.5 tablets (120 mg total) by mouth every morning AND 1 tablet (80 mg total) at bedtime. Marland Kitchen  losartan (COZAAR) 25 MG tablet, Take 2 tablets (50 mg total) by mouth daily. NEED FOLLOW UP APPT WITH [facility] [phone]  Current Outpatient Medications (Respiratory):  .  albuterol (PROVENTIL HFA;VENTOLIN HFA) 108 (90 Base) MCG/ACT inhaler, Inhale 2 puffs into the lungs every 6 (six) hours as needed for wheezing or shortness  of breath. .  VENTOLIN HFA 108 (90 Base) MCG/ACT inhaler, INHALE 2 PUFFS INTO THE LUNGS EVERY 6 HOURS AS NEEDED FOR WHEEZING OR SHORTNESS OF BREATH.  Current Outpatient Medications (Analgesics):  .  aspirin EC 81 MG tablet,  .  HYDROcodone-acetaminophen (NORCO) 10-325 MG tablet, Take 1 tablet by mouth every 8 (eight) hours as  needed.  Current Outpatient Medications (Hematological):  .  cyanocobalamin 2000 MCG tablet, Take 1 tablet (2,000 mcg total) by mouth daily. .  FEROSUL 325 (65 Fe) MG tablet, TAKE 1 TABLET BY MOUTH 2 TIMES DAILY WITH A MEAL. (Patient taking differently: Take 325 mg by mouth 3 (three) times daily with meals. )  Current Outpatient Medications (Other):  .  blood glucose meter kit and supplies KIT, Use to check blood sugar three times daily. DX: E11.8 .  Cholecalciferol 2000 units TABS, Take 1 tablet (2,000 Units total) by mouth daily. .  cycloSPORINE (RESTASIS) 0.05 % ophthalmic emulsion, Place 1 drop into both eyes 2 (two) times daily. (Patient taking differently: Place 1 drop into both eyes daily. ) .  gabapentin (NEURONTIN) 100 MG capsule, Take 1 capsule (100 mg total) by mouth 3 (three) times daily. (Patient taking differently: Take 300 mg by mouth at bedtime. ) .  glucose blood (COOL BLOOD GLUCOSE TEST STRIPS) test strip, Use to check blood sugar three times daily. DX: E11.8 .  Lancets MISC, Use to check blood sugar three times daily. DX: E11.8 .  linaclotide (LINZESS) 290 MCG CAPS capsule, Take 1 capsule (290 mcg total) by mouth daily before breakfast. .  Multiple Vitamins-Minerals (MULTIVITAMIN ADULT PO), Take by mouth. Marland Kitchen  omeprazole (PRILOSEC) 40 MG capsule, Take 1 tablet by mouth 30 min before breakfast. .  phentermine 37.5 MG capsule, TAKE 1 CAPSULE BY MOUTH EVERY MORNING. Marland Kitchen  potassium chloride SA (K-DUR,KLOR-CON) 20 MEQ tablet, Take 1 tablet (20 mEq total) by mouth 2 (two) times daily. Marland Kitchen  thiamine (VITAMIN B-1) 100 MG tablet, TAKE 1 TABLET BY MOUTH DAILY. Marland Kitchen   ULTICARE SHORT PEN NEEDLES 31G X 8 MM MISC, USE WITH INSULIN PENS DAILY. .  vitamin B-6 (PYRIDOXINE) 25 MG tablet, Take 1 tablet (25 mg total) by mouth daily. .  vitamin C (ASCORBIC ACID) 500 MG tablet, Take 2 tablets (1,000 mg total) by mouth 2 (two) times daily. Reported on 01/19/2016 .  Vitamins A & D (VITAMIN A & D) 10000-400 units TABS,  .  zinc gluconate 50 MG tablet, Take 1 tablet (50 mg total) by mouth daily. Reported on 01/19/2016    Past medical history, social, surgical and family history all reviewed in electronic medical record.  [name] pertanent information unless stated regarding to the chief complaint.   Review of Systems:  [name] headache, visual changes, nausea, vomiting, diarrhea, constipation, dizziness, abdominal pain, skin rash, fevers, chills, night sweats, weight loss, swollen lymph nodes, body aches, joint swelling, , chest pain, shortness of breath, mood changes.  Positive muscle aches  Objective  Blood pressure 90/60, height 6' (1.829 m).    General: [name] apparent distress alert and oriented x3 mood and affect normal, dressed appropriately.  HEENT: Pupils equal, extraocular movements intact  Respiratory: Patient's speak in full sentences and does not appear short of breath  Cardiovascular: 2+ lower extremity edema, tender, [name] erythema  Skin: Warm dry intact with [name] signs of infection or rash on extremities or on axial skeleton.  Abdomen: Soft nontender  Neuro: Cranial nerves II through XII are intact, neurovascularly intact in all extremities with 2+ DTRs and 2+ pulses.  Lymph: [name] lymphadenopathy of posterior or anterior cervical chain or axillae bilaterally.  Gait patient is in a wheelchair Patient's right knee difficult to assess secondary to patient's body habitus.  Full range of motion though noted.  [name] significant instability.  Still a very mild tenderness over the lateral patella.  [name] hematoma  noted.    Impression and Recommendations:    . The above documentation  has been reviewed and is accurate and complete Lyndal Pulley, DO       Note: This dictation was prepared with Dragon dictation along with smaller phrase technology. Any transcriptional errors that result from this process are unintentional.

## 2019-03-21 ENCOUNTER — Ambulatory Visit (HOSPITAL_COMMUNITY)
Admission: RE | Admit: 2019-03-21 | Discharge: 2019-03-21 | Disposition: A | Discharge status: Home / Self Care | Payer: Medicare HMO | Source: Ambulatory Visit | Attending: Internal Medicine | Admitting: Internal Medicine

## 2019-03-21 DIAGNOSIS — I5032 Chronic diastolic (congestive) heart failure: Secondary | ICD-10-CM | POA: Diagnosis not present

## 2019-03-21 LAB — BASIC METABOLIC PANEL
Anion gap: 10 (ref 5–15)
BUN: 27 mg/dL — ABNORMAL HIGH (ref 8–23)
CO2: 26 mmol/L (ref 22–32)
Calcium: 9.4 mg/dL (ref 8.9–10.3)
Chloride: 103 mmol/L (ref 98–111)
Creatinine, Ser: 2.24 mg/dL — ABNORMAL HIGH (ref 0.61–1.24)
GFR calc Af Amer: 35 mL/min — ABNORMAL LOW (ref >60)
GFR calc non Af Amer: 31 mL/min — ABNORMAL LOW (ref >60)
Glucose, Bld: 165 mg/dL — ABNORMAL HIGH (ref 70–99)
Potassium: 4.4 mmol/L (ref 3.5–5.1)
Sodium: 139 mmol/L (ref 135–145)

## 2019-03-21 LAB — LIPID PANEL
Cholesterol: 125 mg/dL (ref 0–200)
HDL: 26 mg/dL — ABNORMAL LOW (ref >40)
LDL Cholesterol: 75 mg/dL (ref 0–99)
Total CHOL/HDL Ratio: 4.8 RATIO
Triglycerides: 122 mg/dL (ref <150)
VLDL: 24 mg/dL (ref 0–40)

## 2019-03-25 ENCOUNTER — Other Ambulatory Visit: Payer: Self-pay | Admitting: Internal Medicine

## 2019-03-28 DIAGNOSIS — M25561 Pain in right knee: Secondary | ICD-10-CM | POA: Diagnosis not present

## 2019-03-28 DIAGNOSIS — G8929 Other chronic pain: Secondary | ICD-10-CM | POA: Diagnosis not present

## 2019-04-08 ENCOUNTER — Other Ambulatory Visit: Payer: Self-pay | Admitting: Internal Medicine

## 2019-04-08 DIAGNOSIS — M5416 Radiculopathy, lumbar region: Secondary | ICD-10-CM

## 2019-04-08 DIAGNOSIS — D500 Iron deficiency anemia secondary to blood loss (chronic): Secondary | ICD-10-CM

## 2019-04-08 DIAGNOSIS — D509 Iron deficiency anemia, unspecified: Secondary | ICD-10-CM

## 2019-04-08 DIAGNOSIS — D508 Other iron deficiency anemias: Secondary | ICD-10-CM

## 2019-04-08 MED ORDER — HYDROCODONE-ACETAMINOPHEN 10-325 MG PO TABS: 1.0000 | ORAL_TABLET | Freq: Three times a day (TID) | ORAL | 0 refills | Status: DC | PRN

## 2019-04-09 ENCOUNTER — Encounter: Payer: Self-pay | Admitting: Internal Medicine

## 2019-04-09 ENCOUNTER — Ambulatory Visit (INDEPENDENT_AMBULATORY_CARE_PROVIDER_SITE_OTHER)
Admission: RE | Admit: 2019-04-09 | Discharge: 2019-04-09 | Disposition: A | Discharge status: Home / Self Care | Payer: Medicare HMO | Source: Ambulatory Visit | Attending: Internal Medicine | Admitting: Internal Medicine

## 2019-04-09 ENCOUNTER — Other Ambulatory Visit: Payer: Self-pay

## 2019-04-09 ENCOUNTER — Ambulatory Visit (INDEPENDENT_AMBULATORY_CARE_PROVIDER_SITE_OTHER): Payer: Medicare HMO | Admitting: Internal Medicine

## 2019-04-09 ENCOUNTER — Other Ambulatory Visit (INDEPENDENT_AMBULATORY_CARE_PROVIDER_SITE_OTHER): Payer: Medicare HMO

## 2019-04-09 VITALS — BP 128/58 | HR 77 | Temp 98.3°F | Resp 16 | Ht 72.0 in | Wt >= 6400 oz

## 2019-04-09 DIAGNOSIS — E118 Type 2 diabetes mellitus with unspecified complications: Secondary | ICD-10-CM | POA: Diagnosis not present

## 2019-04-09 DIAGNOSIS — D539 Nutritional anemia, unspecified: Secondary | ICD-10-CM | POA: Diagnosis not present

## 2019-04-09 DIAGNOSIS — R10817 Generalized abdominal tenderness: Secondary | ICD-10-CM

## 2019-04-09 DIAGNOSIS — R109 Unspecified abdominal pain: Secondary | ICD-10-CM | POA: Diagnosis not present

## 2019-04-09 DIAGNOSIS — E039 Hypothyroidism, unspecified: Secondary | ICD-10-CM

## 2019-04-09 DIAGNOSIS — K439 Ventral hernia without obstruction or gangrene: Secondary | ICD-10-CM | POA: Diagnosis not present

## 2019-04-09 LAB — CBC WITH DIFFERENTIAL/PLATELET
Basophils Absolute: 0 10*3/uL (ref 0.0–0.1)
Basophils Relative: 0.4 % (ref 0.0–3.0)
Eosinophils Absolute: 0.2 10*3/uL (ref 0.0–0.7)
Eosinophils Relative: 2.9 % (ref 0.0–5.0)
HCT: 33.4 % — ABNORMAL LOW (ref 39.0–52.0)
Hemoglobin: 10.7 g/dL — ABNORMAL LOW (ref 13.0–17.0)
Lymphocytes Relative: 26.6 % (ref 12.0–46.0)
Lymphs Abs: 2 10*3/uL (ref 0.7–4.0)
MCHC: 32.1 g/dL (ref 30.0–36.0)
MCV: 88.4 fl (ref 78.0–100.0)
Monocytes Absolute: 0.4 10*3/uL (ref 0.1–1.0)
Monocytes Relative: 6.1 % (ref 3.0–12.0)
Neutro Abs: 4.7 10*3/uL (ref 1.4–7.7)
Neutrophils Relative %: 64 % (ref 43.0–77.0)
Platelets: 238 10*3/uL (ref 150.0–400.0)
RBC: 3.77 Mil/uL — ABNORMAL LOW (ref 4.22–5.81)
RDW: 16.7 % — ABNORMAL HIGH (ref 11.5–15.5)
WBC: 7.4 10*3/uL (ref 4.0–10.5)

## 2019-04-09 LAB — AMYLASE: Amylase: 36 U/L (ref 27–131)

## 2019-04-09 LAB — URINALYSIS, ROUTINE W REFLEX MICROSCOPIC
Bilirubin Urine: NEGATIVE
Hgb urine dipstick: NEGATIVE
Ketones, ur: NEGATIVE
Leukocytes,Ua: NEGATIVE
Nitrite: NEGATIVE
RBC / HPF: NONE SEEN (ref 0–?)
Specific Gravity, Urine: 1.02 (ref 1.000–1.030)
Total Protein, Urine: NEGATIVE
Urine Glucose: >=1000 — AB
Urobilinogen, UA: 0.2 (ref 0.0–1.0)
pH: 5.5 (ref 5.0–8.0)

## 2019-04-09 LAB — BASIC METABOLIC PANEL
BUN: 43 mg/dL — ABNORMAL HIGH (ref 6–23)
CO2: 25 mEq/L (ref 19–32)
Calcium: 8.7 mg/dL (ref 8.4–10.5)
Chloride: 102 mEq/L (ref 96–112)
Creatinine, Ser: 2.46 mg/dL — ABNORMAL HIGH (ref 0.40–1.50)
GFR: 32.48 mL/min — ABNORMAL LOW (ref >60.00)
Glucose, Bld: 147 mg/dL — ABNORMAL HIGH (ref 70–99)
Potassium: 4.7 mEq/L (ref 3.5–5.1)
Sodium: 137 mEq/L (ref 135–145)

## 2019-04-09 LAB — VITAMIN B12: Vitamin B-12: 701 pg/mL (ref 211–911)

## 2019-04-09 LAB — IBC PANEL
Iron: 50 ug/dL (ref 42–165)
Saturation Ratios: 18.8 % — ABNORMAL LOW (ref 20.0–50.0)
Transferrin: 190 mg/dL — ABNORMAL LOW (ref 212.0–360.0)

## 2019-04-09 LAB — FERRITIN: Ferritin: 367.7 ng/mL — ABNORMAL HIGH (ref 22.0–322.0)

## 2019-04-09 LAB — FOLATE: Folate: 14.5 ng/mL (ref >5.9)

## 2019-04-09 LAB — TSH: TSH: 2.88 u[IU]/mL (ref 0.35–4.50)

## 2019-04-09 LAB — LIPASE: Lipase: 12 U/L (ref 11.0–59.0)

## 2019-04-09 LAB — HEMOGLOBIN A1C: Hgb A1c MFr Bld: 7.8 % — ABNORMAL HIGH (ref 4.6–6.5)

## 2019-04-09 NOTE — Progress Notes (Signed)
Subjective:  Patient ID: [name], male    DOB: 10-18-1955  Age: 63 y.o. MRN: [id number]  CC: Abdominal Pain and Anemia   HPI [name] presents for a 3-day history of abdominal pain.  He describes it as an intermittent sharp and stabbing sensation.  He denies nausea, vomiting, diarrhea, melena, or bright red blood per rectum.  He complains of intermittent constipation but gets symptom relief with Dulcolax.  He can feel a ventral hernia in his abdomen.  Outpatient Medications Prior to Visit  Medication Sig Dispense Refill   albuterol (PROVENTIL HFA;VENTOLIN HFA) 108 (90 Base) MCG/ACT inhaler Inhale 2 puffs into the lungs every 6 (six) hours as needed for wheezing or shortness of breath. 1 Inhaler 1   aspirin EC 81 MG tablet      atorvastatin (LIPITOR) 40 MG tablet TAKE 1 TABLET BY MOUTH DAILY. 90 tablet 1   bisoprolol (ZEBETA) 10 MG tablet TAKE 1 TABLET (10 MG TOTAL) BY MOUTH EVERY EVENING. (Patient taking differently: Take 40 mg by mouth daily. ) 90 tablet 1   blood glucose meter kit and supplies KIT Use to check blood sugar three times daily. DX: E11.8 1 each 0   Cholecalciferol 2000 units TABS Take 1 tablet (2,000 Units total) by mouth daily. 90 tablet 1   cyanocobalamin 2000 MCG tablet Take 1 tablet (2,000 mcg total) by mouth daily. 90 tablet 1   cycloSPORINE (RESTASIS) 0.05 % ophthalmic emulsion Place 1 drop into both eyes 2 (two) times daily. (Patient taking differently: Place 1 drop into both eyes daily. ) 0.4 mL 5   ferrous sulfate (FEROSUL) 325 (65 FE) MG tablet Take 1 tablet (325 mg total) by mouth 2 (two) times daily with a meal. 180 tablet 1   furosemide (LASIX) 80 MG tablet Take 1.5 tablets (120 mg total) by mouth every morning AND 1 tablet (80 mg total) at bedtime. 180 tablet 3   gabapentin (NEURONTIN) 100 MG capsule TAKE 1 CAPSULE BY MOUTH 3 TIMES DAILY. 90 capsule 2   glucose blood (COOL BLOOD GLUCOSE TEST STRIPS) test strip Use to check blood sugar three  times daily. DX: E11.8 100 each 12   HYDROcodone-acetaminophen (NORCO) 10-325 MG tablet Take 1 tablet by mouth every 8 (eight) hours as needed. 30 tablet 0   Insulin Detemir (LEVEMIR FLEXTOUCH) 100 UNIT/ML Pen Inject 240 Units into the skin daily. 75 mL 5   Lancets MISC Use to check blood sugar three times daily. DX: E11.8 300 each 3   levothyroxine (SYNTHROID) 50 MCG tablet TAKE 1 TABLET BY MOUTH DAILY BEFORE BREAKFAST. 90 tablet 0   linaclotide (LINZESS) 290 MCG CAPS capsule Take 1 capsule (290 mcg total) by mouth daily before breakfast. 90 capsule 1   losartan (COZAAR) 25 MG tablet Take 2 tablets (50 mg total) by mouth daily. NEED FOLLOW UP APPT WITH DR [facility] [phone] 180 tablet 0   Multiple Vitamins-Minerals (MULTIVITAMIN ADULT PO) Take by mouth.     omeprazole (PRILOSEC) 40 MG capsule Take 1 tablet by mouth 30 min before breakfast. 90 capsule 3   potassium chloride SA (K-DUR,KLOR-CON) 20 MEQ tablet Take 1 tablet (20 mEq total) by mouth 2 (two) times daily. 180 tablet 3   SYNJARDY 01-999 MG TABS TAKE 1 TABLET BY MOUTH 2 (TWO) TIMES DAILY. 180 tablet 1   thiamine (VITAMIN B-1) 100 MG tablet TAKE 1 TABLET BY MOUTH DAILY. 90 tablet 1   ULTICARE SHORT PEN NEEDLES 31G X 8 MM MISC USE WITH INSULIN  PENS DAILY. 100 each 3   VENTOLIN HFA 108 (90 Base) MCG/ACT inhaler INHALE 2 PUFFS INTO THE LUNGS EVERY 6 HOURS AS NEEDED FOR WHEEZING OR SHORTNESS OF BREATH. 18 g 2   vitamin B-6 (PYRIDOXINE) 25 MG tablet Take 1 tablet (25 mg total) by mouth daily. 90 tablet 1   vitamin C (ASCORBIC ACID) 500 MG tablet Take 2 tablets (1,000 mg total) by mouth 2 (two) times daily. Reported on 01/19/2016 180 tablet 1   Vitamins A & D (VITAMIN A & D) 10000-400 units TABS      zinc gluconate 50 MG tablet Take 1 tablet (50 mg total) by mouth daily. Reported on 01/19/2016 90 tablet 1   phentermine 37.5 MG capsule TAKE 1 CAPSULE BY MOUTH EVERY MORNING. 30 capsule 0   No facility-administered medications prior  to visit.     ROS Review of Systems  Constitutional: Negative for appetite change, chills, diaphoresis, fatigue and unexpected weight change.  HENT: Negative.   Eyes: Negative for visual disturbance.  Respiratory: Negative.  Negative for cough and shortness of breath.   Cardiovascular: Negative for chest pain and leg swelling.  Gastrointestinal: Positive for abdominal pain and constipation. Negative for abdominal distention, blood in stool, diarrhea, nausea and vomiting.  Genitourinary: Negative for difficulty urinating, dysuria, flank pain, frequency and urgency.  Musculoskeletal: Positive for arthralgias. Negative for back pain.  Skin: Negative.  Negative for rash.  Neurological: Negative.   Hematological: Negative for adenopathy. Does not bruise/bleed easily.  Psychiatric/Behavioral: Negative.     Objective:  BP (!) 128/58 (BP Location: Left Arm, Patient Position: Sitting, Cuff Size: Large)    Pulse 77    Temp 98.3 F (36.8 C) (Oral)    Resp 16    Ht 6' (1.829 m)    Wt (!) 465 lb (210.9 kg)    SpO2 95%    BMI 63.07 kg/m   BP Readings from Last 3 Encounters:  04/09/19 (!) 128/58  03/20/19 90/60  02/13/19 110/70    Wt Readings from Last 3 Encounters:  04/09/19 (!) 465 lb (210.9 kg)  11/22/18 (!) 465 lb (210.9 kg)  05/31/18 (!) 458 lb 12 oz (208.1 kg)    Physical Exam Constitutional:      General: He is not in acute distress.    Appearance: He is obese. He is not ill-appearing, toxic-appearing or diaphoretic.  Eyes:     General: No scleral icterus. Cardiovascular:     Rate and Rhythm: Normal rate and regular rhythm.     Heart sounds: No murmur.  Pulmonary:     Effort: Pulmonary effort is normal.     Breath sounds: No stridor. No wheezing, rhonchi or rales.  Abdominal:     General: Abdomen is protuberant. Bowel sounds are increased. There is no distension.     Palpations: Abdomen is soft. There is no hepatomegaly, splenomegaly or mass.     Tenderness: There is  generalized abdominal tenderness.     Hernia: A hernia is present. Hernia is present in the ventral area.  Skin:    General: Skin is warm and dry.     Findings: No rash.  Neurological:     General: No focal deficit present.     Mental Status: He is alert.  Psychiatric:        Mood and Affect: Mood normal.        Behavior: Behavior normal.     Lab Results  Component Value Date   WBC 7.4 04/09/2019  HGB 10.7 (L) 04/09/2019   HCT 33.4 (L) 04/09/2019   PLT 238.0 04/09/2019   GLUCOSE 147 (H) 04/09/2019   CHOL 125 03/21/2019   TRIG 122 03/21/2019   HDL 26 (L) 03/21/2019   LDLDIRECT 136.8 12/12/2006   LDLCALC 75 03/21/2019   ALT 16 02/13/2018   AST 23 02/13/2018   NA 137 04/09/2019   K 4.7 04/09/2019   CL 102 04/09/2019   CREATININE 2.46 (H) 04/09/2019   BUN 43 (H) 04/09/2019   CO2 25 04/09/2019   TSH 2.88 04/09/2019   PSA 1.43 11/22/2018   HGBA1C 7.8 (H) 04/09/2019   MICROALBUR 1.0 06/01/2018    Dg Abd Acute 2+v W 1v Chest  Result Date: 04/09/2019 CLINICAL DATA:  Abdominal pain for 3 days. EXAM: DG ABDOMEN ACUTE W/ 1V CHEST COMPARISON:  PA and lateral chest 11/26/2015. Single-view of the chest and plain films the abdomen 01/13/2017. FINDINGS: Single-view of the chest demonstrates clear lungs and normal heart size. No pneumothorax or pleural fluid. Two views of the abdomen show no free intraperitoneal air. Bowel gas pattern is nonobstructive. Lap band is in place. IMPRESSION: No acute abnormality chest or abdomen. Electronically Signed   By: Inge Rise M.D.   On: 04/09/2019 10:10    Assessment & Plan:   Silviano was seen today for abdominal pain and anemia.  Diagnoses and all orders for this visit:  Deficiency anemia-his H&H are stable.  I will screen him for vitamin deficiencies.  There may also be an anemia of chronic disease. -     IBC panel; Future -     CBC with Differential/Platelet; Future -     Folate; Future -     Ferritin; Future -     Vitamin B1;  Future -     Vitamin B12; Future  Generalized abdominal tenderness without rebound tenderness-based on a paucity of other symptoms, and unremarkable abdominal exam, normal plain films, normal labs I do not think he has an acute abdominal process.  I am concerned he is symptomatic from the hernia so have asked him to see general surgery. -     Lipase; Future -     Amylase; Future -     CBC with Differential/Platelet; Future -     Basic metabolic panel; Future -     Urinalysis, Routine w reflex microscopic; Future -     DG ABD ACUTE 2+V W 1V CHEST; Future  Acquired hypothyroidism- His TSH is in the normal range.  He will stay on the current dose of levothyroxine. -     TSH; Future  Type II diabetes mellitus with manifestations ([place])- Improvement noted.  His A1c is down to 7.8%.  Will continue the current meds as he improves his lifestyle modifications. -     Hemoglobin A1c; Future  Ventral hernia without obstruction or gangrene -     Ambulatory referral to General Surgery   I have discontinued [employer]'s phentermine. I am also having him maintain his vitamin B-6, zinc gluconate, omeprazole, albuterol, thiamine, Multiple Vitamins-Minerals (MULTIVITAMIN ADULT PO), aspirin EC, Vitamin A & D, cyanocobalamin, Cholecalciferol, UltiCare Short Pen Needles, vitamin C, Ventolin HFA, cycloSPORINE, bisoprolol, Synjardy, losartan, linaclotide, furosemide, potassium chloride SA, blood glucose meter kit and supplies, glucose blood, Lancets, Insulin Detemir, levothyroxine, atorvastatin, HYDROcodone-acetaminophen, gabapentin, and ferrous sulfate.  No orders of the defined types were placed in this encounter.    Follow-up: Return if symptoms worsen or fail to improve.  Scarlette Calico, MD

## 2019-04-09 NOTE — Patient Instructions (Signed)
Abdominal Pain, Adult Abdominal pain can be caused by many things. Often, abdominal pain is not serious and it gets better with no treatment or by being treated at home. However, sometimes abdominal pain is serious. Your health care provider will do a medical history and a physical exam to try to determine the cause of your abdominal pain. Follow these instructions at home:  Take over-the-counter and prescription medicines only as told by your health care provider. Do not take a laxative unless told by your health care provider.  Drink enough fluid to keep your urine clear or pale yellow.  Watch your condition for any changes.  Keep all follow-up visits as told by your health care provider. This is important. Contact a health care provider if:  Your abdominal pain changes or gets worse.  You are not hungry or you lose weight without trying.  You are constipated or have diarrhea for more than 2-3 days.  You have pain when you urinate or have a bowel movement.  Your abdominal pain wakes you up at night.  Your pain gets worse with meals, after eating, or with certain foods.  You are throwing up and cannot keep anything down.  You have a fever. Get help right away if:  Your pain does not go away as soon as your health care provider told you to expect.  You cannot stop throwing up.  Your pain is only in areas of the abdomen, such as the right side or the left lower portion of the abdomen.  You have bloody or black stools, or stools that look like tar.  You have severe pain, cramping, or bloating in your abdomen.  You have signs of dehydration, such as: ? Dark urine, very little urine, or no urine. ? Cracked lips. ? Dry mouth. ? Sunken eyes. ? Sleepiness. ? Weakness. This information is not intended to replace advice given to you by your health care provider. Make sure you discuss any questions you have with your health care provider. Document Released: 06/15/2005 Document  Revised: 03/25/2016 Document Reviewed: 02/17/2016 Elsevier Interactive Patient Education  2020 Elsevier Inc.  

## 2019-04-10 ENCOUNTER — Other Ambulatory Visit: Payer: Self-pay

## 2019-04-10 DIAGNOSIS — G4733 Obstructive sleep apnea (adult) (pediatric): Secondary | ICD-10-CM | POA: Diagnosis not present

## 2019-04-10 NOTE — Patient Outreach (Signed)
[facility]) Care Management  04/10/2019   [name] 02-25-1956 [id number]  Subjective: Successful call to the patient.  Two patient identifiers obtained.  The patient states the is doing good.  He had an appointment with his PCP yesterday and discussed his abdominal pain. The patient stated he has been constipated and took dulcolax for four days.  The physician felt he was taking to many and that was causing his stomach pain.  The patient states he released a lot of gas last night and he has no pain today. He states that he wants to purchase an over the counter stool softener. He states that he is checking his blood sugars daily.  His FBS this morning was 97 and yesterday was 127. His a1c on 7/21 was 7.8 down from the previous 9.3.  Congratulated the patient on his efforts concerning his health.  He states that his medications are the same.  He is currently waiting to have surgery for a hernia repair.   Current Medications:  Current Outpatient Medications  Medication Sig Dispense Refill  . albuterol (PROVENTIL HFA;VENTOLIN HFA) 108 (90 Base) MCG/ACT inhaler Inhale 2 puffs into the lungs every 6 (six) hours as needed for wheezing or shortness of breath. 1 Inhaler 1  . aspirin EC 81 MG tablet     . atorvastatin (LIPITOR) 40 MG tablet TAKE 1 TABLET BY MOUTH DAILY. 90 tablet 1  . bisoprolol (ZEBETA) 10 MG tablet TAKE 1 TABLET (10 MG TOTAL) BY MOUTH EVERY EVENING. (Patient taking differently: Take 40 mg by mouth daily. ) 90 tablet 1  . blood glucose meter kit and supplies KIT Use to check blood sugar three times daily. DX: E11.8 1 each 0  . Cholecalciferol 2000 units TABS Take 1 tablet (2,000 Units total) by mouth daily. 90 tablet 1  . cyanocobalamin 2000 MCG tablet Take 1 tablet (2,000 mcg total) by mouth daily. 90 tablet 1  . cycloSPORINE (RESTASIS) 0.05 % ophthalmic emulsion Place 1 drop into both eyes 2 (two) times daily. (Patient taking differently: Place 1 drop into both  eyes daily. ) 0.4 mL 5  . ferrous sulfate (FEROSUL) 325 (65 FE) MG tablet Take 1 tablet (325 mg total) by mouth 2 (two) times daily with a meal. 180 tablet 1  . furosemide (LASIX) 80 MG tablet Take 1.5 tablets (120 mg total) by mouth every morning AND 1 tablet (80 mg total) at bedtime. 180 tablet 3  . gabapentin (NEURONTIN) 100 MG capsule TAKE 1 CAPSULE BY MOUTH 3 TIMES DAILY. 90 capsule 2  . glucose blood (COOL BLOOD GLUCOSE TEST STRIPS) test strip Use to check blood sugar three times daily. DX: E11.8 100 each 12  . HYDROcodone-acetaminophen (NORCO) 10-325 MG tablet Take 1 tablet by mouth every 8 (eight) hours as needed. 30 tablet 0  . Insulin Detemir (LEVEMIR FLEXTOUCH) 100 UNIT/ML Pen Inject 240 Units into the skin daily. 75 mL 5  . Lancets MISC Use to check blood sugar three times daily. DX: E11.8 300 each 3  . levothyroxine (SYNTHROID) 50 MCG tablet TAKE 1 TABLET BY MOUTH DAILY BEFORE BREAKFAST. 90 tablet 0  . linaclotide (LINZESS) 290 MCG CAPS capsule Take 1 capsule (290 mcg total) by mouth daily before breakfast. 90 capsule 1  . losartan (COZAAR) 25 MG tablet Take 2 tablets (50 mg total) by mouth daily. NEED FOLLOW UP APPT WITH DR [facility] [phone] 180 tablet 0  . Multiple Vitamins-Minerals (MULTIVITAMIN ADULT PO) Take by mouth.    Marland Kitchen omeprazole (  PRILOSEC) 40 MG capsule Take 1 tablet by mouth 30 min before breakfast. 90 capsule 3  . potassium chloride SA (K-DUR,KLOR-CON) 20 MEQ tablet Take 1 tablet (20 mEq total) by mouth 2 (two) times daily. 180 tablet 3  . SYNJARDY 01-999 MG TABS TAKE 1 TABLET BY MOUTH 2 (TWO) TIMES DAILY. 180 tablet 1  . thiamine (VITAMIN B-1) 100 MG tablet TAKE 1 TABLET BY MOUTH DAILY. 90 tablet 1  . ULTICARE SHORT PEN NEEDLES 31G X 8 MM MISC USE WITH INSULIN PENS DAILY. 100 each 3  . VENTOLIN HFA 108 (90 Base) MCG/ACT inhaler INHALE 2 PUFFS INTO THE LUNGS EVERY 6 HOURS AS NEEDED FOR WHEEZING OR SHORTNESS OF BREATH. 18 g 2  . vitamin B-6 (PYRIDOXINE) 25 MG tablet Take 1  tablet (25 mg total) by mouth daily. 90 tablet 1  . vitamin C (ASCORBIC ACID) 500 MG tablet Take 2 tablets (1,000 mg total) by mouth 2 (two) times daily. Reported on 01/19/2016 180 tablet 1  . Vitamins A & D (VITAMIN A & D) 10000-400 units TABS     . zinc gluconate 50 MG tablet Take 1 tablet (50 mg total) by mouth daily. Reported on 01/19/2016 90 tablet 1   No current facility-administered medications for this visit.     Functional Status:  In your present state of health, do you have any difficulty performing the following activities: 12/26/2018 11/25/2018  Hearing? N N  Vision? N N  Difficulty concentrating or making decisions? N Y  Walking or climbing stairs? Y Y  Comment problems withhis hip  He uses a walker or moterized wheel chair -  Dressing or bathing? Y Y  Doing errands, shopping? N Y  Some recent data might be hidden    Fall/Depression Screening: Fall Risk  01/25/2019 12/26/2018 11/25/2018  Falls in the past year? 0 1 0  Comment - - -  Number falls in past yr: - 1 -  Comment - patient states that he is not resting and nodding and fell off the bed -  Injury with Fall? - 0 -  Risk Factor Category  - - -  Comment - - -  Risk for fall due to : - - Impaired balance/gait;Impaired mobility  Risk for fall due to: Comment - - -  Follow up - Education provided Falls evaluation completed;Education provided;Falls prevention discussed   PHQ 2/9 Scores 12/26/2018 11/27/2018 11/22/2018 10/17/2017 08/08/2016 02/03/2016 01/19/2016  PHQ - 2 Score 0 0 0 1 0 0 0  PHQ- 9 Score - - - 9 - - -    Assessment: Patient will continue to benefit from health coach outreach for disease management and support. [facility] CM Care Plan Problem One     Most Recent Value  [facility] Long Term Goal   In 60 days the patient will lower his a1c 1-2 points  [facility] Long Term Goal Start Date  04/10/19  Interventions for Problem One Long Term Goal  Reviewed FBS, encouraged him to continue to check his blood sugars daily, discussed diet,      Plan: [place] will contact patient in the month of September and patient agrees to next outreach.   Lazaro Arms RN, BSN, [place] Direct Dial:  [phone]  Fax: [phone]

## 2019-04-12 ENCOUNTER — Ambulatory Visit: Payer: Self-pay

## 2019-04-13 LAB — VITAMIN B1: Vitamin B1 (Thiamine): 64 nmol/L — ABNORMAL HIGH (ref 8–30)

## 2019-04-15 ENCOUNTER — Telehealth (HOSPITAL_COMMUNITY): Payer: Self-pay | Admitting: Licensed Clinical Social Worker

## 2019-04-15 ENCOUNTER — Telehealth (HOSPITAL_COMMUNITY): Payer: Self-pay

## 2019-04-15 ENCOUNTER — Telehealth: Payer: Self-pay | Admitting: Internal Medicine

## 2019-04-15 NOTE — Telephone Encounter (Signed)
Received call from pt requesting a scale. Stated that dr. Aundra Dubin said he would give him one. Called pt back to arrange for him to get one. Left voicemail. Routed to [place].

## 2019-04-15 NOTE — Telephone Encounter (Signed)
At his convenience as long as he does not have symptoms and he is just dropping off paperwork.

## 2019-04-15 NOTE — Telephone Encounter (Signed)
CSW referred to assist patient with obtaining a scale. CSW contacted patient to inform scale will be delivered to home. Message left. CSW available as needed. Jackie Keishla Oyer, LCSW, CCSW-MCS [phone]  

## 2019-04-15 NOTE — Telephone Encounter (Signed)
Notified pt. 

## 2019-04-15 NOTE — Telephone Encounter (Signed)
Copied from [place] [id number]. Topic: General - Other >> Apr 15, 2019  9:24 AM Rainey Pines A wrote: Patient needs DMV and section 8 paperwork filled out by Dr. [name] and would like to know when he can bring those in. Patient would like a callback

## 2019-04-16 ENCOUNTER — Ambulatory Visit: Payer: Medicare HMO | Admitting: Internal Medicine

## 2019-04-18 DIAGNOSIS — Z0279 Encounter for issue of other medical certificate: Secondary | ICD-10-CM

## 2019-04-18 NOTE — Telephone Encounter (Signed)
lvm - I need 2 items clarified at this time.

## 2019-04-19 ENCOUNTER — Telehealth (HOSPITAL_COMMUNITY): Payer: Self-pay | Admitting: *Deleted

## 2019-04-19 NOTE — Telephone Encounter (Signed)
Pt left a vm asking if he could drop off DMV paperwork to be filled out. Called pt to get more information no answer/left vm

## 2019-04-24 ENCOUNTER — Other Ambulatory Visit: Payer: Self-pay | Admitting: Internal Medicine

## 2019-04-26 ENCOUNTER — Telehealth: Payer: Self-pay | Admitting: Internal Medicine

## 2019-04-26 MED ORDER — COOL BLOOD GLUCOSE TEST STRIPS VI STRP: ORAL_STRIP | 12 refills | Status: DC

## 2019-04-26 MED ORDER — LANCETS MISC: 3 refills | Status: DC

## 2019-04-26 MED ORDER — BLOOD GLUCOSE MONITOR KIT: PACK | 0 refills | Status: DC

## 2019-04-26 NOTE — Telephone Encounter (Signed)
Printed for pcp to sign on Monday.

## 2019-04-26 NOTE — Telephone Encounter (Signed)
Medication Refill - Medication: accucheck meter 30 day supply, accucheck soft click lancets 90 day supply (Pharamcy called and stated they faxed over requester 04/24/2019)  Has the patient contacted their pharmacy? Yes (Agent: If no, request that the patient contact the pharmacy for the refill.) (Agent: If yes, when and what did the pharmacy advise?)Contact PCP  Preferred Pharmacy (with phone number or street name): [facility], [place] [phone] (Phone) [phone] (Fax)     Agent: Please be advised that RX refills may take up to 3 business days. We ask that you follow-up with your pharmacy.

## 2019-05-01 DIAGNOSIS — E1169 Type 2 diabetes mellitus with other specified complication: Secondary | ICD-10-CM | POA: Diagnosis not present

## 2019-05-01 DIAGNOSIS — Z9884 Bariatric surgery status: Secondary | ICD-10-CM | POA: Diagnosis not present

## 2019-05-01 DIAGNOSIS — G4733 Obstructive sleep apnea (adult) (pediatric): Secondary | ICD-10-CM | POA: Diagnosis not present

## 2019-05-01 DIAGNOSIS — E669 Obesity, unspecified: Secondary | ICD-10-CM | POA: Diagnosis not present

## 2019-05-01 DIAGNOSIS — Z9989 Dependence on other enabling machines and devices: Secondary | ICD-10-CM | POA: Diagnosis not present

## 2019-05-01 DIAGNOSIS — Z7409 Other reduced mobility: Secondary | ICD-10-CM | POA: Diagnosis not present

## 2019-05-01 DIAGNOSIS — I10 Essential (primary) hypertension: Secondary | ICD-10-CM | POA: Diagnosis not present

## 2019-05-01 DIAGNOSIS — K432 Incisional hernia without obstruction or gangrene: Secondary | ICD-10-CM | POA: Diagnosis not present

## 2019-05-02 DIAGNOSIS — E118 Type 2 diabetes mellitus with unspecified complications: Secondary | ICD-10-CM | POA: Diagnosis not present

## 2019-05-02 DIAGNOSIS — E1121 Type 2 diabetes mellitus with diabetic nephropathy: Secondary | ICD-10-CM | POA: Diagnosis not present

## 2019-05-02 NOTE — Telephone Encounter (Signed)
This was faxed Tuesday.

## 2019-05-06 ENCOUNTER — Other Ambulatory Visit: Payer: Self-pay | Admitting: General Surgery

## 2019-05-06 ENCOUNTER — Ambulatory Visit: Payer: Self-pay | Admitting: Neurology

## 2019-05-06 DIAGNOSIS — K432 Incisional hernia without obstruction or gangrene: Secondary | ICD-10-CM

## 2019-05-07 ENCOUNTER — Other Ambulatory Visit: Payer: Self-pay | Admitting: General Surgery

## 2019-05-07 DIAGNOSIS — K432 Incisional hernia without obstruction or gangrene: Secondary | ICD-10-CM

## 2019-05-11 DIAGNOSIS — G4733 Obstructive sleep apnea (adult) (pediatric): Secondary | ICD-10-CM | POA: Diagnosis not present

## 2019-05-14 ENCOUNTER — Other Ambulatory Visit: Payer: Self-pay | Admitting: Internal Medicine

## 2019-05-14 DIAGNOSIS — M5416 Radiculopathy, lumbar region: Secondary | ICD-10-CM

## 2019-05-15 ENCOUNTER — Ambulatory Visit: Payer: Medicare HMO | Admitting: Internal Medicine

## 2019-05-20 ENCOUNTER — Other Ambulatory Visit: Payer: Medicare HMO

## 2019-05-21 ENCOUNTER — Ambulatory Visit (INDEPENDENT_AMBULATORY_CARE_PROVIDER_SITE_OTHER): Payer: Medicare HMO | Admitting: Internal Medicine

## 2019-05-21 ENCOUNTER — Encounter: Payer: Self-pay | Admitting: Internal Medicine

## 2019-05-21 ENCOUNTER — Other Ambulatory Visit: Payer: Self-pay

## 2019-05-21 DIAGNOSIS — M150 Primary generalized (osteo)arthritis: Secondary | ICD-10-CM | POA: Diagnosis not present

## 2019-05-21 DIAGNOSIS — B351 Tinea unguium: Secondary | ICD-10-CM | POA: Insufficient documentation

## 2019-05-21 DIAGNOSIS — N522 Drug-induced erectile dysfunction: Secondary | ICD-10-CM

## 2019-05-21 DIAGNOSIS — M159 Polyosteoarthritis, unspecified: Secondary | ICD-10-CM

## 2019-05-21 MED ORDER — FLUCONAZOLE 200 MG PO TABS: 200.0000 mg | ORAL_TABLET | ORAL | 0 refills | Status: DC

## 2019-05-21 NOTE — Patient Instructions (Signed)
Fungal Nail Infection A fungal nail infection is a common infection of the toenails or fingernails. This condition affects toenails more often than fingernails. It often affects the great, or big, toes. More than one nail may be infected. The condition can be passed from person to person (is contagious). What are the causes? This condition is caused by a fungus. Several types of fungi can cause the infection. These fungi are common in moist and warm areas. If your hands or feet come into contact with the fungus, it may get into a crack in your fingernail or toenail and cause the infection. What increases the risk? The following factors may make you more likely to develop this condition:  Being male.  Being of older age.  Living with someone who has the fungus.  Walking barefoot in areas where the fungus thrives, such as showers or locker rooms.  Wearing shoes and socks that cause your feet to sweat.  Having a nail injury or a recent nail surgery.  Having certain medical conditions, such as: ? Athlete's foot. ? Diabetes. ? Psoriasis. ? Poor circulation. ? A weak body defense system (immune system). What are the signs or symptoms? Symptoms of this condition include:  A pale spot on the nail.  Thickening of the nail.  A nail that becomes yellow or brown.  A brittle or ragged nail edge.  A crumbling nail.  A nail that has lifted away from the nail bed. How is this diagnosed? This condition is diagnosed with a physical exam. Your health care provider may take a scraping or clipping from your nail to test for the fungus. How is this treated? Treatment is not needed for mild infections. If you have significant nail changes, treatment may include:  Antifungal medicines taken by mouth (orally). You may need to take the medicine for several weeks or several months, and you may not see the results for a long time. These medicines can cause side effects. Ask your health care provider  what problems to watch for.  Antifungal nail polish or nail cream. These may be used along with oral antifungal medicines.  Laser treatment of the nail.  Surgery to remove the nail. This may be needed for the most severe infections. It can take a long time, usually up to a year, for the infection to go away. The infection may also come back. Follow these instructions at home: Medicines  Take or apply over-the-counter and prescription medicines only as told by your health care provider.  Ask your health care provider about using over-the-counter mentholated ointment on your nails. Nail care  Trim your nails often.  Wash and dry your hands and feet every day.  Keep your feet dry: ? Wear absorbent socks, and change your socks frequently. ? Wear shoes that allow air to circulate, such as sandals or canvas tennis shoes. Throw out old shoes.  Do not use artificial nails.  If you go to a nail salon, make sure you choose one that uses clean instruments.  Use antifungal foot powder on your feet and in your shoes. General instructions  Do not share personal items, such as towels or nail clippers.  Do not walk barefoot in shower rooms or locker rooms.  Wear rubber gloves if you are working with your hands in wet areas.  Keep all follow-up visits as told by your health care provider. This is important. Contact a health care provider if: Your infection is not getting better or it is getting worse   after several months. Summary  A fungal nail infection is a common infection of the toenails or fingernails.  Treatment is not needed for mild infections. If you have significant nail changes, treatment may include taking medicine orally and applying medicine to your nails.  It can take a long time, usually up to a year, for the infection to go away. The infection may also come back.  Take or apply over-the-counter and prescription medicines only as told by your health care provider.   Follow instructions for taking care of your nails to help prevent infection from coming back or spreading. This information is not intended to replace advice given to you by your health care provider. Make sure you discuss any questions you have with your health care provider. Document Released: 09/02/2000 Document Revised: 12/27/2018 Document Reviewed: 02/09/2018 Elsevier Patient Education  2020 [employer].

## 2019-05-21 NOTE — Progress Notes (Signed)
Subjective:  Patient ID: [name], male    DOB: Feb 01, 1956  Age: 63 y.o. MRN: [id number]  CC: Nail Problem   HPI [name] presents for concerns about his toenails.  He complains that over the last few months that his toenails have become uncomfortable, discolored, and too long.  Due to his poor functional capacity he cannot trim her care for his toenails.  He is losing weight with phentermine and wants to take it for another month.  Outpatient Medications Prior to Visit  Medication Sig Dispense Refill   albuterol (PROVENTIL HFA;VENTOLIN HFA) 108 (90 Base) MCG/ACT inhaler Inhale 2 puffs into the lungs every 6 (six) hours as needed for wheezing or shortness of breath. 1 Inhaler 1   aspirin EC 81 MG tablet      atorvastatin (LIPITOR) 40 MG tablet TAKE 1 TABLET BY MOUTH DAILY. 90 tablet 1   bisoprolol (ZEBETA) 10 MG tablet TAKE 1 TABLET (10 MG TOTAL) BY MOUTH EVERY EVENING. (Patient taking differently: Take 40 mg by mouth daily. ) 90 tablet 1   blood glucose meter kit and supplies KIT Use to check blood sugar three times daily. DX: E11.8 1 each 0   Cholecalciferol 2000 units TABS Take 1 tablet (2,000 Units total) by mouth daily. 90 tablet 1   cyanocobalamin 2000 MCG tablet Take 1 tablet (2,000 mcg total) by mouth daily. 90 tablet 1   cycloSPORINE (RESTASIS) 0.05 % ophthalmic emulsion Place 1 drop into both eyes 2 (two) times daily. (Patient taking differently: Place 1 drop into both eyes daily. ) 0.4 mL 5   ferrous sulfate (FEROSUL) 325 (65 FE) MG tablet Take 1 tablet (325 mg total) by mouth 2 (two) times daily with a meal. 180 tablet 1   furosemide (LASIX) 80 MG tablet Take 1.5 tablets (120 mg total) by mouth every morning AND 1 tablet (80 mg total) at bedtime. 180 tablet 3   gabapentin (NEURONTIN) 100 MG capsule TAKE 1 CAPSULE BY MOUTH 3 TIMES DAILY. 90 capsule 2   glucose blood (COOL BLOOD GLUCOSE TEST STRIPS) test strip Use to check blood sugar three times daily.  DX: E11.8 100 each 12   HYDROcodone-acetaminophen (NORCO) 10-325 MG tablet TAKE 1 TABLET BY MOUTH EVERY 8 HOURS AS NEEDED. 30 tablet 0   Insulin Detemir (LEVEMIR FLEXTOUCH) 100 UNIT/ML Pen Inject 240 Units into the skin daily. 75 mL 5   Lancets MISC Use to check blood sugar three times daily. DX: E11.8 300 each 3   levothyroxine (SYNTHROID) 50 MCG tablet TAKE 1 TABLET BY MOUTH DAILY BEFORE BREAKFAST. 90 tablet 0   linaclotide (LINZESS) 290 MCG CAPS capsule Take 1 capsule (290 mcg total) by mouth daily before breakfast. 90 capsule 1   losartan (COZAAR) 25 MG tablet Take 2 tablets (50 mg total) by mouth daily. NEED FOLLOW UP APPT WITH [facility] [phone] 180 tablet 0   Multiple Vitamins-Minerals (MULTIVITAMIN ADULT PO) Take by mouth.     omeprazole (PRILOSEC) 40 MG capsule Take 1 tablet by mouth 30 min before breakfast. 90 capsule 3   potassium chloride SA (K-DUR,KLOR-CON) 20 MEQ tablet Take 1 tablet (20 mEq total) by mouth 2 (two) times daily. 180 tablet 3   SYNJARDY 01-999 MG TABS TAKE 1 TABLET BY MOUTH 2 (TWO) TIMES DAILY. 180 tablet 1   thiamine (VITAMIN B-1) 100 MG tablet TAKE 1 TABLET BY MOUTH DAILY. 90 tablet 1   [employer] SHORT PEN NEEDLES 31G X 8 MM MISC USE  WITH INSULIN PENS DAILY. 100 each 3   VENTOLIN HFA 108 (90 Base) MCG/ACT inhaler INHALE 2 PUFFS INTO THE LUNGS EVERY 6 HOURS AS NEEDED FOR WHEEZING OR SHORTNESS OF BREATH. 18 g 2   vitamin B-6 (PYRIDOXINE) 25 MG tablet Take 1 tablet (25 mg total) by mouth daily. 90 tablet 1   vitamin C (ASCORBIC ACID) 500 MG tablet Take 2 tablets (1,000 mg total) by mouth 2 (two) times daily. Reported on 01/19/2016 180 tablet 1   Vitamins A & D (VITAMIN A & D) 10000-400 units TABS      zinc gluconate 50 MG tablet Take 1 tablet (50 mg total) by mouth daily. Reported on 01/19/2016 90 tablet 1   No facility-administered medications prior to visit.     ROS Review of Systems  Constitutional: Negative for diaphoresis and fatigue.  HENT:  Negative.   Respiratory: Negative for cough, chest tightness, shortness of breath and wheezing.   Cardiovascular: Negative for chest pain, palpitations and leg swelling.  Gastrointestinal: Negative for abdominal pain, diarrhea and vomiting.  Genitourinary: Negative.  Negative for difficulty urinating.       + ED  Musculoskeletal: Positive for arthralgias. Negative for myalgias.  Skin: Negative.  Negative for color change and pallor.  Neurological: Negative.  Negative for dizziness, weakness, light-headedness and headaches.  Hematological: Negative for adenopathy. Does not bruise/bleed easily.  Psychiatric/Behavioral: Negative.     Objective:  BP (!) 134/58 (BP Location: Left Arm, Patient Position: Sitting, Cuff Size: Large)    Pulse 70    Temp 98.2 F (36.8 C) (Oral)    Resp 16    Ht 6' (1.829 m)    Wt (!) 451 lb (204.6 kg)    SpO2 95%    BMI 61.17 kg/m   BP Readings from Last 3 Encounters:  05/21/19 (!) 134/58  04/09/19 (!) 128/58  03/20/19 90/60    Wt Readings from Last 3 Encounters:  05/21/19 (!) 451 lb (204.6 kg)  04/09/19 (!) 465 lb (210.9 kg)  11/22/18 (!) 465 lb (210.9 kg)    Physical Exam Vitals signs reviewed.  Constitutional:      Appearance: He is obese. He is ill-appearing.  HENT:     Nose: Nose normal.     Mouth/Throat:     Mouth: Mucous membranes are moist.     Pharynx: No oropharyngeal exudate.  Eyes:     General: No scleral icterus.    Conjunctiva/sclera: Conjunctivae normal.  Neck:     Musculoskeletal: Normal range of motion.  Cardiovascular:     Rate and Rhythm: Normal rate and regular rhythm.     Heart sounds: No murmur.  Pulmonary:     Effort: Pulmonary effort is normal.     Breath sounds: No stridor. No wheezing, rhonchi or rales.  Abdominal:     General: Abdomen is protuberant. Bowel sounds are normal.     Palpations: There is no hepatomegaly or splenomegaly.     Tenderness: There is no abdominal tenderness.  Musculoskeletal: Normal range of  motion.  Feet:     Right foot:     Skin integrity: Dry skin present. No ulcer, blister, skin breakdown, erythema, warmth, callus or fissure.     Toenail Condition: Right toenails are long. Fungal disease present.    Left foot:     Skin integrity: Dry skin present. No ulcer, blister, skin breakdown, erythema, warmth, callus or fissure.     Toenail Condition: Left toenails are long. Fungal disease present.  Comments: See photos Skin:    General: Skin is warm and dry.  Neurological:     General: No focal deficit present.     Mental Status: He is alert.  Psychiatric:        Mood and Affect: Mood normal.        Behavior: Behavior normal.     Lab Results  Component Value Date   WBC 7.4 04/09/2019   HGB 10.7 (L) 04/09/2019   HCT 33.4 (L) 04/09/2019   PLT 238.0 04/09/2019   GLUCOSE 147 (H) 04/09/2019   CHOL 125 03/21/2019   TRIG 122 03/21/2019   HDL 26 (L) 03/21/2019   LDLDIRECT 136.8 12/12/2006   LDLCALC 75 03/21/2019   ALT 16 02/13/2018   AST 23 02/13/2018   NA 137 04/09/2019   K 4.7 04/09/2019   CL 102 04/09/2019   CREATININE 2.46 (H) 04/09/2019   BUN 43 (H) 04/09/2019   CO2 25 04/09/2019   TSH 2.88 04/09/2019   PSA 1.43 11/22/2018   HGBA1C 7.8 (H) 04/09/2019   MICROALBUR 1.0 06/01/2018    Dg Abd Acute 2+v W 1v Chest  Result Date: 04/09/2019 CLINICAL DATA:  Abdominal pain for 3 days. EXAM: DG ABDOMEN ACUTE W/ 1V CHEST COMPARISON:  PA and lateral chest 11/26/2015. Single-view of the chest and plain films the abdomen 01/13/2017. FINDINGS: Single-view of the chest demonstrates clear lungs and normal heart size. No pneumothorax or pleural fluid. Two views of the abdomen show no free intraperitoneal air. Bowel gas pattern is nonobstructive. Lap band is in place. IMPRESSION: No acute abnormality chest or abdomen. Electronically Signed   By: Inge Rise M.D.   On: 04/09/2019 10:10    Assessment & Plan:   [name] was seen today for nail problem.  Diagnoses and all  orders for this visit:  Morbid obesity ([place])- Improvement noted.  Will continue Adipex for the next month. -     For home use only DME Other see comment  Primary osteoarthritis involving multiple joints -     For home use only DME Other see comment  Onychomycosis of toenail- I recommended that he treat this with a weekly dose of fluconazole for 3 months. -     Ambulatory referral to Podiatry -     fluconazole (DIFLUCAN) 200 MG tablet; Take 1 tablet (200 mg total) by mouth once a week.  Drug-induced erectile dysfunction -     sildenafil (REVATIO) 20 MG tablet; Take 4 tablets (80 mg total) by mouth daily as needed.   I am having [name] start on fluconazole and sildenafil. I am also having him maintain his vitamin B-6, zinc gluconate, omeprazole, albuterol, thiamine, Multiple Vitamins-Minerals (MULTIVITAMIN ADULT PO), aspirin EC, Vitamin A & D, cyanocobalamin, Cholecalciferol, [employer] Short Pen Needles, vitamin C, Ventolin HFA, cycloSPORINE, bisoprolol, Synjardy, losartan, linaclotide, furosemide, potassium chloride SA, Insulin Detemir, atorvastatin, gabapentin, ferrous sulfate, Cool Blood Glucose Test Strips, blood glucose meter kit and supplies, Lancets, levothyroxine, and HYDROcodone-acetaminophen.  Meds ordered this encounter  Medications   fluconazole (DIFLUCAN) 200 MG tablet    Sig: Take 1 tablet (200 mg total) by mouth once a week.    Dispense:  12 tablet    Refill:  0   sildenafil (REVATIO) 20 MG tablet    Sig: Take 4 tablets (80 mg total) by mouth daily as needed.    Dispense:  60 tablet    Refill:  5     Follow-up: Return in about 3 months (around 08/20/2019).  [name]  [name], MD

## 2019-05-22 ENCOUNTER — Telehealth: Payer: Self-pay

## 2019-05-22 DIAGNOSIS — N522 Drug-induced erectile dysfunction: Secondary | ICD-10-CM | POA: Insufficient documentation

## 2019-05-22 MED ORDER — SILDENAFIL CITRATE 20 MG PO TABS: 80.0000 mg | ORAL_TABLET | Freq: Every day | ORAL | 5 refills | Status: DC | PRN

## 2019-05-22 NOTE — Telephone Encounter (Signed)
Pt is requesting rx for sildenafil to [facility].   Please advise.

## 2019-05-22 NOTE — Telephone Encounter (Signed)
Copied from [place] [phone]. Topic: General - Other >> May 22, 2019  3:21 PM Pauline Good wrote: Reason for CRM: pt stated he came in office yesterday and Dr [name] forgot to give him a medication, pt didn't want to say what it was. Please call pt

## 2019-05-23 ENCOUNTER — Other Ambulatory Visit: Payer: Self-pay | Admitting: Internal Medicine

## 2019-05-24 NOTE — Addendum Note (Signed)
Addended by: Aviva Signs M on: 05/24/2019 04:53 PM   Modules accepted: Orders

## 2019-06-06 ENCOUNTER — Encounter (HOSPITAL_COMMUNITY): Payer: Medicare HMO | Admitting: Cardiology

## 2019-06-10 ENCOUNTER — Telehealth: Payer: Self-pay

## 2019-06-10 ENCOUNTER — Ambulatory Visit: Payer: Self-pay | Admitting: Family Medicine

## 2019-06-10 NOTE — Progress Notes (Deleted)
PATIENT: [name] DOB: 25-Apr-1956  REASON FOR VISIT: follow up HISTORY FROM: patient  No chief complaint on file.    HISTORY OF PRESENT ILLNESS: Today 06/10/19 [name] is a 63 y.o. male here today for follow up for OSA on CPAP.   Compliance report dated 05/07/2019 through 06/05/2019 reveals that he has used CPAP 18 out of the last 30 days for compliance of 60%.  10 of the last 30 days he used CPAP greater than 4 hours for compliance of 33%.  Average usage was 4 hours and 42 minutes.  AHI was 2.7 on 7 to 20 cm of water and an EPR of 3.  There is a significant leak noted in the 95th percentile and 55.3.  HISTORY: (copied from Dr Dohmeier's note on 12/24/2018)  HPI:  [name] is a 63 y.o. male patient and was seen by video- referral for OSA work up through Dr. Eilleen Kempf .  [name] was seen previously by Dr. Scarlette Calico also in the video connection on 18 December 2018.  The patient is super obese, and struggles with significant mobility issues, continued weight gain, social isolation at this time, his only regular exercise had been swimming and the pool is closed due to the coronavirus crisis. The patient states that he lived in [place] when he was originally diagnosed with obstructive sleep apnea approximately 13 years ago he has always used the same machine since initial diagnosis which broke about a year ago.  He has a comorbidities of diabetes mellitus, congestive heart failure chronic kidney disease, hypertension, superobesity, currently untreated obstructive sleep apnea, and some chronic pain issues.  He reports orthopnea and obesity hypoventilation.  Chief complaint according to patient : I just can't stay asleep- and I am very, very sleepy".   Sleep habits are as follows: When asking [name] about his sleep habits he was somewhat evasive.  He states that since he cannot sleep through he naps on and off all day.  His dinnertime may vary between 6 and 7 PM  and he may go to bed very early but he does not sleep usually before 4 AM.  He wakes up choking he needs to sleep either in a seated position propped up in bed or in a recliner otherwise he will choke snore and startle himself awake.  The problem is not to initiate sleep or to stay asleep.  He also reports that he dreams intrusive dreams sometimes almost hallucinations even in daytime after a short nap he has woken up with visions some of them frightening.  He also states that his days and nights flow together that overall there may be less than 4 hours of sleep.  There is certainly also sleep hygiene to address as he states he is watching TV on and off in daytime and at night. He never feels refreshed, restored, and the last time he has felt this way would have been while on CPAP.   Sleep medical history : [name] stated that he had been disabled and not return to work after a fall this seems to have been more than 2 decades ago and he is using an ambulation walker he reports having had bilateral hip recent repairs or surgeries, he has decreased range of motion when ambulating, he has lower extremity edema diabetes, hypertension, but now super obesity with hypoventilation and orthopnea, and he is status post bariatric surgery according to his medical notes but I did not inquire  about this.  Worrisome is the chronic kidney disease and the diagnosis of congestive heart failure.    Social history: single,  lives alone, sleeps alone. He  has not been gainfully employed in over a decade.  Denies ETOH, Tobacco use.  Has coffee 2 cups in AM, no caffeinated sodas or teas.  Regular swimmer and church attendee.    REVIEW OF SYSTEMS: Out of a complete 14 system review of symptoms, the patient complains only of the following symptoms, and all other reviewed systems are negative.  ALLERGIES: Allergies  Allergen Reactions  . Shellfish Allergy Anaphylaxis  . Lisinopril Cough    HOME  MEDICATIONS: Outpatient Medications Prior to Visit  Medication Sig Dispense Refill  . albuterol (PROVENTIL HFA;VENTOLIN HFA) 108 (90 Base) MCG/ACT inhaler Inhale 2 puffs into the lungs every 6 (six) hours as needed for wheezing or shortness of breath. 1 Inhaler 1  . aspirin EC 81 MG tablet     . atorvastatin (LIPITOR) 40 MG tablet TAKE 1 TABLET BY MOUTH DAILY. 90 tablet 1  . bisoprolol (ZEBETA) 10 MG tablet TAKE 1 TABLET (10 MG TOTAL) BY MOUTH EVERY EVENING. (Patient taking differently: Take 40 mg by mouth daily. ) 90 tablet 1  . blood glucose meter kit and supplies KIT Use to check blood sugar three times daily. DX: E11.8 1 each 0  . Cholecalciferol 2000 units TABS Take 1 tablet (2,000 Units total) by mouth daily. 90 tablet 1  . cyanocobalamin 2000 MCG tablet Take 1 tablet (2,000 mcg total) by mouth daily. 90 tablet 1  . cycloSPORINE (RESTASIS) 0.05 % ophthalmic emulsion Place 1 drop into both eyes 2 (two) times daily. (Patient taking differently: Place 1 drop into both eyes daily. ) 0.4 mL 5  . ferrous sulfate (FEROSUL) 325 (65 FE) MG tablet Take 1 tablet (325 mg total) by mouth 2 (two) times daily with a meal. 180 tablet 1  . fluconazole (DIFLUCAN) 200 MG tablet Take 1 tablet (200 mg total) by mouth once a week. 12 tablet 0  . furosemide (LASIX) 80 MG tablet Take 1.5 tablets (120 mg total) by mouth every morning AND 1 tablet (80 mg total) at bedtime. 180 tablet 3  . gabapentin (NEURONTIN) 100 MG capsule TAKE 1 CAPSULE BY MOUTH 3 TIMES DAILY. 90 capsule 2  . glucose blood (COOL BLOOD GLUCOSE TEST STRIPS) test strip Use to check blood sugar three times daily. DX: E11.8 100 each 12  . HYDROcodone-acetaminophen (NORCO) 10-325 MG tablet TAKE 1 TABLET BY MOUTH EVERY 8 HOURS AS NEEDED. 30 tablet 0  . Insulin Detemir (LEVEMIR FLEXTOUCH) 100 UNIT/ML Pen Inject 240 Units into the skin daily. 75 mL 5  . Lancets MISC Use to check blood sugar three times daily. DX: E11.8 300 each 3  . levothyroxine  (SYNTHROID) 50 MCG tablet TAKE 1 TABLET BY MOUTH DAILY BEFORE BREAKFAST. 90 tablet 0  . linaclotide (LINZESS) 290 MCG CAPS capsule Take 1 capsule (290 mcg total) by mouth daily before breakfast. 90 capsule 1  . losartan (COZAAR) 25 MG tablet Take 2 tablets (50 mg total) by mouth daily. NEED FOLLOW UP APPT WITH DR [facility] [phone] 180 tablet 0  . Multiple Vitamins-Minerals (MULTIVITAMIN ADULT PO) Take by mouth.    Marland Kitchen omeprazole (PRILOSEC) 40 MG capsule Take 1 tablet by mouth 30 min before breakfast. 90 capsule 3  . phentermine 37.5 MG capsule TAKE 1 CAPSULE BY MOUTH EVERY MORNING. 30 capsule 0  . potassium chloride SA (K-DUR,KLOR-CON) 20 MEQ tablet Take  1 tablet (20 mEq total) by mouth 2 (two) times daily. 180 tablet 3  . sildenafil (REVATIO) 20 MG tablet Take 4 tablets (80 mg total) by mouth daily as needed. 60 tablet 5  . SYNJARDY 01-999 MG TABS TAKE 1 TABLET BY MOUTH 2 (TWO) TIMES DAILY. 180 tablet 1  . thiamine (VITAMIN B-1) 100 MG tablet TAKE 1 TABLET BY MOUTH DAILY. 90 tablet 1  . ULTICARE SHORT PEN NEEDLES 31G X 8 MM MISC USE WITH INSULIN PENS DAILY. 100 each 3  . VENTOLIN HFA 108 (90 Base) MCG/ACT inhaler INHALE 2 PUFFS INTO THE LUNGS EVERY 6 HOURS AS NEEDED FOR WHEEZING OR SHORTNESS OF BREATH. 18 g 2  . vitamin B-6 (PYRIDOXINE) 25 MG tablet Take 1 tablet (25 mg total) by mouth daily. 90 tablet 1  . vitamin C (ASCORBIC ACID) 500 MG tablet Take 2 tablets (1,000 mg total) by mouth 2 (two) times daily. Reported on 01/19/2016 180 tablet 1  . Vitamins A & D (VITAMIN A & D) 10000-400 units TABS     . zinc gluconate 50 MG tablet Take 1 tablet (50 mg total) by mouth daily. Reported on 01/19/2016 90 tablet 1   No facility-administered medications prior to visit.     PAST MEDICAL HISTORY: Past Medical History:  Diagnosis Date  . Anemia    iron  . CHF (congestive heart failure) (HCC)    Presumed diastolic. Echo (06/07) w.EF 45%, severe posterior HK, mild LV hypertrophy, No further work-up of  abnormal echo was done. pt denies CHF  . Colon cancer ([place]) 2007   s/p sigmoid colectomy  . Diabetes mellitus    Has Hx of diabetic foot ulcer & peripheral neuropathy  type2  . Dyspnea    w/ activity  . History of PFTs 05/2009   Mild Obstructive defect  . HTN (hypertension)   . Hyperlipidemia   . Morbid obesity ([place])   . OSA on CPAP   . Pneumonia    week ago     PAST SURGICAL HISTORY: Past Surgical History:  Procedure Laterality Date  . BARIATRIC SURGERY  12/2009   Lap Band/At [facility]  . COLONOSCOPY  multiple  . COLONOSCOPY WITH PROPOFOL N/A 11/26/2012   Procedure: COLONOSCOPY WITH PROPOFOL;  Surgeon: Gatha Mayer, MD;  Location: [facility];  Service: Endoscopy;  Laterality: N/A;  may need pre appt. with anesthesia due to morbid obesity  . COLONOSCOPY WITH PROPOFOL N/A 07/11/2017   Procedure: COLONOSCOPY WITH PROPOFOL;  Surgeon: Gatha Mayer, MD;  Location: [facility];  Service: Endoscopy;  Laterality: N/A;  . [place]   '80/Right  '84/Left  . EYE SURGERY Right 05/2017  . HERNIA REPAIR    . HIP SURGERY     bilateral  . INCISIONAL HERNIA REPAIR N/A 12/18/2013   Procedure:  REPAIR OF INCARCERATED INCISIONAL HERNIA;  Surgeon: Rolm Bookbinder, MD;  Location: [facility];  Service: General;  Laterality: N/A;  . INSERTION OF MESH N/A 12/18/2013   Procedure: INSERTION OF MESH;  Surgeon: Rolm Bookbinder, MD;  Location: [facility];  Service: General;  Laterality: N/A;  . Sigmoid Colectomy  10/2005   [facility]  . TONSILLECTOMY      FAMILY HISTORY: Family History  Problem Relation Age of Onset  . Hepatitis Mother        hepatitis C  . Diabetes Mother   . Congestive Heart Failure Mother   . CAD Mother   . Heart attack Brother 37  . Diabetes  Father   . Heart failure Brother   . Stomach cancer Neg Hx   . Colon cancer Neg Hx     SOCIAL HISTORY: Social History   Socioeconomic History  . Marital status: Legally Separated    Spouse name: Not on file  .  Number of children: 0  . Years of education: Not on file  . Highest education level: Not on file  Occupational History  . Occupation: Engineering geologist  . Financial resource strain: Not on file  . Food insecurity    Worry: Not on file    Inability: Not on file  . Transportation needs    Medical: Not on file    Non-medical: Not on file  Tobacco Use  . Smoking status: Never Smoker  . Smokeless tobacco: Never Used  Substance and Sexual Activity  . Alcohol use: Not Currently    Alcohol/week: 0.0 standard drinks    Comment: states he does not drink   . Drug use: No  . Sexual activity: Not Currently  Lifestyle  . Physical activity    Days per week: Not on file    Minutes per session: Not on file  . Stress: Not on file  Relationships  . Social Herbalist on phone: Not on file    Gets together: Not on file    Attends religious service: Not on file    Active member of club or organization: Not on file    Attends meetings of clubs or organizations: Not on file    Relationship status: Not on file  . Intimate partner violence    Fear of current or ex partner: Not on file    Emotionally abused: Not on file    Physically abused: Not on file    Forced sexual activity: Not on file  Other Topics Concern  . 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   In new relationship (04/2009) - girlfriend helps taste for cooking business and helps monitor for ulcers.   Regular Exercise- No         PHYSICAL EXAM  There were no vitals filed for this visit. There is no height or weight on file to calculate BMI.  Generalized: Well developed, in no acute distress  Cardiology: normal rate and rhythm, no murmur noted Neurological examination  Mentation:  Alert oriented to time, place, history taking. Follows all commands speech and language fluent Cranial nerve II-XII: Pupils were equal round reactive to light. Extraocular movements were full, visual field were full on confrontational test. Facial sensation and strength were normal. Uvula tongue midline. Head turning and shoulder shrug  were normal and symmetric. Motor: The motor testing reveals 5 over 5 strength of all 4 extremities. Good symmetric motor tone is noted throughout.  Sensory: Sensory testing is intact to soft touch on all 4 extremities. No evidence of extinction is noted.  Coordination: Cerebellar testing reveals good finger-nose-finger and heel-to-shin bilaterally.  Gait and station: Gait is normal. Tandem gait is normal. Romberg is negative. No drift is seen.  Reflexes: Deep tendon reflexes are symmetric and normal bilaterally.   DIAGNOSTIC DATA (LABS, IMAGING, TESTING) - I reviewed patient records, labs, notes, testing and imaging  myself where available.  MMSE - Mini Mental State Exam 02/05/2015  Not completed: (No Data)     Lab Results  Component Value Date   WBC 7.4 04/09/2019   HGB 10.7 (L) 04/09/2019   HCT 33.4 (L) 04/09/2019   MCV 88.4 04/09/2019   PLT 238.0 04/09/2019      Component Value Date/Time   NA 137 04/09/2019 0915   K 4.7 04/09/2019 0915   CL 102 04/09/2019 0915   CO2 25 04/09/2019 0915   GLUCOSE 147 (H) 04/09/2019 0915   BUN 43 (H) 04/09/2019 0915   CREATININE 2.46 (H) 04/09/2019 0915   CREATININE 1.59 (H) 03/03/2014 0745   CALCIUM 8.7 04/09/2019 0915   PROT 8.3 02/13/2018 0820   ALBUMIN 3.6 02/13/2018 0820   AST 23 02/13/2018 0820   ALT 16 02/13/2018 0820   ALKPHOS 91 02/13/2018 0820   BILITOT 0.4 02/13/2018 0820   GFRNONAA 31 (L) 03/21/2019 1201   GFRAA 35 (L) 03/21/2019 1201   Lab Results  Component Value Date   CHOL 125 03/21/2019   HDL 26 (L) 03/21/2019   LDLCALC 75 03/21/2019   LDLDIRECT 136.8 12/12/2006   TRIG 122 03/21/2019    CHOLHDL 4.8 03/21/2019   Lab Results  Component Value Date   HGBA1C 7.8 (H) 04/09/2019   Lab Results  Component Value Date   VITAMINB12 701 04/09/2019   Lab Results  Component Value Date   TSH 2.88 04/09/2019       ASSESSMENT AND PLAN 63 y.o. year old male  has a past medical history of Anemia, CHF (congestive heart failure) ([place]), Colon cancer ([place]) (2007), Diabetes mellitus, Dyspnea, History of PFTs (05/2009), HTN (hypertension), Hyperlipidemia, Morbid obesity ([place]), OSA on CPAP, and Pneumonia. here with   No diagnosis found.   Graydon has had some difficulty using CPAP therapy at home.  Compliance report reveals suboptimal compliance.We have discussed possible causes of leak.  He was encouraged to continue using CPAP nightly and for greater than 4 hours each night.  We have discussed risk of untreated sleep apnea.  He will follow-up in 3 months to reassess compliance and leak.  He verbalizes understanding and agreement with this plan.  No orders of the defined types were placed in this encounter.    No orders of the defined types were placed in this encounter.     I spent 15 minutes with the patient. 50% of this time was spent counseling and educating patient on plan of care and medications.    Debbora Presto, FNP-C 06/10/2019, 7:19 AM [facility] [address] [place], [place] [address] [phone]

## 2019-06-10 NOTE — Telephone Encounter (Signed)
Patient was a no call/no show for their appointment today.   

## 2019-06-11 ENCOUNTER — Encounter: Payer: Self-pay | Admitting: Family Medicine

## 2019-06-11 DIAGNOSIS — G4733 Obstructive sleep apnea (adult) (pediatric): Secondary | ICD-10-CM | POA: Diagnosis not present

## 2019-06-12 ENCOUNTER — Other Ambulatory Visit: Payer: Self-pay

## 2019-06-12 NOTE — Patient Outreach (Signed)
[facility]) Care Management  06/12/2019   [name] [birth year] [id number]  Subjective: Successful out reach to the patient.  Two patient identifiers obtained.  The patient states that he is doing well.  He denies any pain or falls.  He states that his blood sugar this morning was 76.  He is ranging between 90-140.  He is monitoring his diet and taking his medications as he should.  He is unable to exercise due to problems with his knees but he states that he is trying to take things slow.  He is not using his CPAP machine everyday because he is trying to get use to it.  He had an appointment to follow up on his CPAP but had an emergency and had to cancel.  He states that he is going to reschedule the appointment but wants to speak with his insurance first.  He has an appointment on the 30th of this month with podiatry.  Current Medications:  Current Outpatient Medications  Medication Sig Dispense Refill  . albuterol (PROVENTIL HFA;VENTOLIN HFA) 108 (90 Base) MCG/ACT inhaler Inhale 2 puffs into the lungs every 6 (six) hours as needed for wheezing or shortness of breath. 1 Inhaler 1  . aspirin EC 81 MG tablet     . atorvastatin (LIPITOR) 40 MG tablet TAKE 1 TABLET BY MOUTH DAILY. 90 tablet 1  . bisoprolol (ZEBETA) 10 MG tablet TAKE 1 TABLET (10 MG TOTAL) BY MOUTH EVERY EVENING. (Patient taking differently: Take 40 mg by mouth daily. ) 90 tablet 1  . blood glucose meter kit and supplies KIT Use to check blood sugar three times daily. DX: E11.8 1 each 0  . Cholecalciferol 2000 units TABS Take 1 tablet (2,000 Units total) by mouth daily. 90 tablet 1  . cyanocobalamin 2000 MCG tablet Take 1 tablet (2,000 mcg total) by mouth daily. 90 tablet 1  . cycloSPORINE (RESTASIS) 0.05 % ophthalmic emulsion Place 1 drop into both eyes 2 (two) times daily. (Patient taking differently: Place 1 drop into both eyes daily. ) 0.4 mL 5  . ferrous sulfate (FEROSUL) 325 (65 FE) MG tablet Take 1 tablet  (325 mg total) by mouth 2 (two) times daily with a meal. 180 tablet 1  . fluconazole (DIFLUCAN) 200 MG tablet Take 1 tablet (200 mg total) by mouth once a week. 12 tablet 0  . furosemide (LASIX) 80 MG tablet Take 1.5 tablets (120 mg total) by mouth every morning AND 1 tablet (80 mg total) at bedtime. 180 tablet 3  . gabapentin (NEURONTIN) 100 MG capsule TAKE 1 CAPSULE BY MOUTH 3 TIMES DAILY. 90 capsule 2  . glucose blood (COOL BLOOD GLUCOSE TEST STRIPS) test strip Use to check blood sugar three times daily. DX: E11.8 100 each 12  . HYDROcodone-acetaminophen (NORCO) 10-325 MG tablet TAKE 1 TABLET BY MOUTH EVERY 8 HOURS AS NEEDED. 30 tablet 0  . Insulin Detemir (LEVEMIR FLEXTOUCH) 100 UNIT/ML Pen Inject 240 Units into the skin daily. 75 mL 5  . Lancets MISC Use to check blood sugar three times daily. DX: E11.8 300 each 3  . levothyroxine (SYNTHROID) 50 MCG tablet TAKE 1 TABLET BY MOUTH DAILY BEFORE BREAKFAST. 90 tablet 0  . linaclotide (LINZESS) 290 MCG CAPS capsule Take 1 capsule (290 mcg total) by mouth daily before breakfast. 90 capsule 1  . losartan (COZAAR) 25 MG tablet Take 2 tablets (50 mg total) by mouth daily. NEED FOLLOW UP APPT WITH [facility] [phone] 180 tablet 0  .  Multiple Vitamins-Minerals (MULTIVITAMIN ADULT PO) Take by mouth.    Marland Kitchen omeprazole (PRILOSEC) 40 MG capsule Take 1 tablet by mouth 30 min before breakfast. 90 capsule 3  . phentermine 37.5 MG capsule TAKE 1 CAPSULE BY MOUTH EVERY MORNING. 30 capsule 0  . potassium chloride SA (K-DUR,KLOR-CON) 20 MEQ tablet Take 1 tablet (20 mEq total) by mouth 2 (two) times daily. 180 tablet 3  . sildenafil (REVATIO) 20 MG tablet Take 4 tablets (80 mg total) by mouth daily as needed. 60 tablet 5  . SYNJARDY 01-999 MG TABS TAKE 1 TABLET BY MOUTH 2 (TWO) TIMES DAILY. 180 tablet 1  . thiamine (VITAMIN B-1) 100 MG tablet TAKE 1 TABLET BY MOUTH DAILY. 90 tablet 1  . ULTICARE SHORT PEN NEEDLES 31G X 8 MM MISC USE WITH INSULIN PENS DAILY. 100 each  3  . VENTOLIN HFA 108 (90 Base) MCG/ACT inhaler INHALE 2 PUFFS INTO THE LUNGS EVERY 6 HOURS AS NEEDED FOR WHEEZING OR SHORTNESS OF BREATH. 18 g 2  . vitamin B-6 (PYRIDOXINE) 25 MG tablet Take 1 tablet (25 mg total) by mouth daily. 90 tablet 1  . vitamin C (ASCORBIC ACID) 500 MG tablet Take 2 tablets (1,000 mg total) by mouth 2 (two) times daily. Reported on 01/19/2016 180 tablet 1  . Vitamins A & D (VITAMIN A & D) 10000-400 units TABS     . zinc gluconate 50 MG tablet Take 1 tablet (50 mg total) by mouth daily. Reported on 01/19/2016 90 tablet 1   No current facility-administered medications for this visit.     Functional Status:  In your present state of health, do you have any difficulty performing the following activities: 12/26/2018 11/25/2018  Hearing? N N  Vision? N N  Difficulty concentrating or making decisions? N Y  Walking or climbing stairs? Y Y  Comment problems withhis hip  He uses a walker or moterized wheel chair -  Dressing or bathing? Y Y  Doing errands, shopping? N Y  Some recent data might be hidden    Fall/Depression Screening: Fall Risk  06/12/2019 01/25/2019 12/26/2018  Falls in the past year? 0 0 1  Comment - - -  Number falls in past yr: - - 1  Comment - - patient states that he is not resting and nodding and fell off the bed  Injury with Fall? - - 0  Risk Factor Category  - - -  Comment - - -  Risk for fall due to : - - -  Risk for fall due to: Comment - - -  Follow up - - Education provided   [facility] 2/9 Scores 12/26/2018 11/27/2018 11/22/2018 10/17/2017 08/08/2016 02/03/2016 01/19/2016  PHQ - 2 Score 0 0 0 1 0 0 0  PHQ- 9 Score - - - 9 - - -    Assessment: Patient will continue to benefit from health coach outreach for disease management and support. [facility] CM Care Plan Problem One     Most Recent Value  [facility] Long Term Goal   In 90 days th epatient will lower his a1c of 7.8 1-2 points.  [facility] Long Term Goal Start Date  06/12/19  Interventions for Problem One Long Term Goal   Discussed and encouraged the patient to exercised ,reviewed his blood sugars, discussed the importance of eating meals and taking insulin, encouraged medication adherence.     Plan: RN Health Coach will contact patient in the month of December and patient agrees to next outreach.  Lazaro Arms RN, BSN, [place] Direct Dial:  [phone]  Fax: [phone]

## 2019-06-19 ENCOUNTER — Encounter: Payer: Self-pay | Admitting: Podiatry

## 2019-06-19 ENCOUNTER — Other Ambulatory Visit: Payer: Self-pay

## 2019-06-19 ENCOUNTER — Ambulatory Visit (INDEPENDENT_AMBULATORY_CARE_PROVIDER_SITE_OTHER): Payer: Medicare HMO | Admitting: Podiatry

## 2019-06-19 VITALS — BP 124/74

## 2019-06-19 DIAGNOSIS — E118 Type 2 diabetes mellitus with unspecified complications: Secondary | ICD-10-CM

## 2019-06-19 DIAGNOSIS — B351 Tinea unguium: Secondary | ICD-10-CM | POA: Diagnosis not present

## 2019-06-19 DIAGNOSIS — E1142 Type 2 diabetes mellitus with diabetic polyneuropathy: Secondary | ICD-10-CM

## 2019-06-19 NOTE — Progress Notes (Signed)
  Subjective:  Patient ID: [name], male    DOB: October 13, 1955,  MRN: [id number]  Chief Complaint  Patient presents with  . Nail Problem    pt is here for a nail trim/ or toenail fungus, pt is a diabetic type 2   63 y.o. male returns for the above complaint. Toenails x 10 have been very painful in nature. He denies any other complaints. His glucose this morning was "good".   Objective:   Vitals:   06/19/19 1036  BP: 124/74   General AA&O x3. Normal mood and affect.  Vascular Pedal pulses palpable.  Neurologic Epicritic sensation grossly intact.  Dermatologic No open lesions. Skin normal texture and turgor. Toenails x 10 elongated, thickened, dystrophic.  Orthopedic: Pain to palpation about the toenails.   Assessment & Plan:  Diabetic peripheral neuropathy ([place])  Type II diabetes mellitus with manifestations ([place])  Onychomycosis of toenail  Patient was evaluated and treated and all questions answered.  Onychomycosis with pain  -Nails palliatively debrided as below. -Educated on self-care  Procedure: Nail Debridement Rationale: pain  Type of Debridement: manual, sharp debridement. Instrumentation: Nail nipper, rotary burr. Number of Nails: 10     Return in about 3 months (around 09/18/2019).

## 2019-06-27 ENCOUNTER — Other Ambulatory Visit: Payer: Self-pay | Admitting: Internal Medicine

## 2019-06-29 ENCOUNTER — Other Ambulatory Visit: Payer: Self-pay | Admitting: Internal Medicine

## 2019-06-29 DIAGNOSIS — E1142 Type 2 diabetes mellitus with diabetic polyneuropathy: Secondary | ICD-10-CM

## 2019-06-29 DIAGNOSIS — I10 Essential (primary) hypertension: Secondary | ICD-10-CM

## 2019-06-29 DIAGNOSIS — E118 Type 2 diabetes mellitus with unspecified complications: Secondary | ICD-10-CM

## 2019-06-29 DIAGNOSIS — E785 Hyperlipidemia, unspecified: Secondary | ICD-10-CM

## 2019-06-29 DIAGNOSIS — E039 Hypothyroidism, unspecified: Secondary | ICD-10-CM

## 2019-06-29 DIAGNOSIS — E1022 Type 1 diabetes mellitus with diabetic chronic kidney disease: Secondary | ICD-10-CM

## 2019-06-29 DIAGNOSIS — I5032 Chronic diastolic (congestive) heart failure: Secondary | ICD-10-CM

## 2019-06-29 MED ORDER — LEVEMIR FLEXTOUCH 100 UNIT/ML ~~LOC~~ SOPN: 240.0000 [IU] | PEN_INJECTOR | Freq: Every day | SUBCUTANEOUS | 5 refills | Status: DC

## 2019-06-29 MED ORDER — SYNJARDY 5-1000 MG PO TABS: 1.0000 | ORAL_TABLET | Freq: Two times a day (BID) | ORAL | 1 refills | Status: DC

## 2019-06-29 MED ORDER — POTASSIUM CHLORIDE CRYS ER 20 MEQ PO TBCR: 20.0000 meq | EXTENDED_RELEASE_TABLET | Freq: Two times a day (BID) | ORAL | 3 refills | Status: DC

## 2019-06-29 MED ORDER — LEVOTHYROXINE SODIUM 50 MCG PO TABS: ORAL_TABLET | ORAL | 0 refills | Status: DC

## 2019-06-29 MED ORDER — LOSARTAN POTASSIUM 25 MG PO TABS: 50.0000 mg | ORAL_TABLET | Freq: Every day | ORAL | 1 refills | Status: DC

## 2019-06-29 MED ORDER — ATORVASTATIN CALCIUM 40 MG PO TABS: 40.0000 mg | ORAL_TABLET | Freq: Every day | ORAL | 1 refills | Status: DC

## 2019-06-29 MED ORDER — BISOPROLOL FUMARATE 10 MG PO TABS: 10.0000 mg | ORAL_TABLET | Freq: Every evening | ORAL | 1 refills | Status: DC

## 2019-06-29 MED ORDER — FUROSEMIDE 80 MG PO TABS: ORAL_TABLET | ORAL | 3 refills | Status: DC

## 2019-06-29 MED ORDER — ULTICARE SHORT PEN NEEDLES 31G X 8 MM MISC: 3 refills | Status: DC

## 2019-06-29 MED ORDER — GABAPENTIN 100 MG PO CAPS: 100.0000 mg | ORAL_CAPSULE | Freq: Three times a day (TID) | ORAL | 1 refills | Status: DC

## 2019-07-02 ENCOUNTER — Other Ambulatory Visit: Payer: Self-pay | Admitting: Internal Medicine

## 2019-07-04 ENCOUNTER — Other Ambulatory Visit: Payer: Self-pay | Admitting: Internal Medicine

## 2019-07-04 DIAGNOSIS — M5416 Radiculopathy, lumbar region: Secondary | ICD-10-CM

## 2019-07-04 NOTE — Telephone Encounter (Signed)
Patient would like a refill on his HYDROcodone-acetaminophen ([place]) 10-325 MG tablet medication and have it sent to his preferred pharmacy [facility] on [address]. Patient is completely out of medication.

## 2019-07-05 MED ORDER — HYDROCODONE-ACETAMINOPHEN 10-325 MG PO TABS: 1.0000 | ORAL_TABLET | Freq: Three times a day (TID) | ORAL | 0 refills | Status: DC | PRN

## 2019-07-05 NOTE — Telephone Encounter (Signed)
Please advise in PCP's absence. Thanks!  [place] Controlled Database Checked Last filled: 05/15/19 # 30 LOV: 05/21/19 Next appt: None

## 2019-07-10 ENCOUNTER — Telehealth: Payer: Self-pay

## 2019-07-10 NOTE — Telephone Encounter (Signed)
[employer] sent rx rf rq for levothyroxine and gabapentin. Both have been sent in.

## 2019-07-11 DIAGNOSIS — G4733 Obstructive sleep apnea (adult) (pediatric): Secondary | ICD-10-CM | POA: Diagnosis not present

## 2019-07-29 ENCOUNTER — Other Ambulatory Visit: Payer: Self-pay | Admitting: Internal Medicine

## 2019-08-01 ENCOUNTER — Ambulatory Visit: Payer: Medicare HMO | Admitting: Internal Medicine

## 2019-08-06 ENCOUNTER — Ambulatory Visit (INDEPENDENT_AMBULATORY_CARE_PROVIDER_SITE_OTHER): Payer: Medicare HMO | Admitting: Internal Medicine

## 2019-08-06 ENCOUNTER — Encounter: Payer: Self-pay | Admitting: Internal Medicine

## 2019-08-06 ENCOUNTER — Other Ambulatory Visit: Payer: Self-pay

## 2019-08-06 VITALS — BP 118/60 | HR 73 | Temp 97.9°F | Ht 72.0 in | Wt >= 6400 oz

## 2019-08-06 DIAGNOSIS — E1121 Type 2 diabetes mellitus with diabetic nephropathy: Secondary | ICD-10-CM

## 2019-08-06 DIAGNOSIS — M8949 Other hypertrophic osteoarthropathy, multiple sites: Secondary | ICD-10-CM

## 2019-08-06 DIAGNOSIS — M5416 Radiculopathy, lumbar region: Secondary | ICD-10-CM | POA: Diagnosis not present

## 2019-08-06 DIAGNOSIS — E538 Deficiency of other specified B group vitamins: Secondary | ICD-10-CM | POA: Diagnosis not present

## 2019-08-06 DIAGNOSIS — E118 Type 2 diabetes mellitus with unspecified complications: Secondary | ICD-10-CM

## 2019-08-06 DIAGNOSIS — C189 Malignant neoplasm of colon, unspecified: Secondary | ICD-10-CM

## 2019-08-06 DIAGNOSIS — E1142 Type 2 diabetes mellitus with diabetic polyneuropathy: Secondary | ICD-10-CM

## 2019-08-06 DIAGNOSIS — D508 Other iron deficiency anemias: Secondary | ICD-10-CM

## 2019-08-06 DIAGNOSIS — E039 Hypothyroidism, unspecified: Secondary | ICD-10-CM

## 2019-08-06 DIAGNOSIS — M159 Polyosteoarthritis, unspecified: Secondary | ICD-10-CM

## 2019-08-06 LAB — POCT GLYCOSYLATED HEMOGLOBIN (HGB A1C): Hemoglobin A1C: 6.6 % — AB (ref 4.0–5.6)

## 2019-08-06 MED ORDER — LEVEMIR FLEXTOUCH 100 UNIT/ML ~~LOC~~ SOPN: 200.0000 [IU] | PEN_INJECTOR | Freq: Every day | SUBCUTANEOUS | 5 refills | Status: DC

## 2019-08-06 MED ORDER — HYDROCODONE-ACETAMINOPHEN 10-325 MG PO TABS: 1.0000 | ORAL_TABLET | Freq: Four times a day (QID) | ORAL | 0 refills | Status: DC | PRN

## 2019-08-06 NOTE — Progress Notes (Signed)
Subjective:  Patient ID: [name], male    DOB: [birth year]  Age: 63 y.o. MRN: [id number]  CC: Anemia, Hypertension, and Diabetes   HPI [name] presents for f/up - He has been losing weight with phentermine and complains that his blood sugar was down to 39 about 2 weeks ago.  Outpatient Medications Prior to Visit  Medication Sig Dispense Refill  . Accu-Chek Softclix Lancets lancets CHECK BLOOD SUGAR THREE TIMES DAILY 100 each 2  . albuterol (PROVENTIL HFA;VENTOLIN HFA) 108 (90 Base) MCG/ACT inhaler Inhale 2 puffs into the lungs every 6 (six) hours as needed for wheezing or shortness of breath. 1 Inhaler 1  . aspirin EC 81 MG tablet     . atorvastatin (LIPITOR) 40 MG tablet Take 1 tablet (40 mg total) by mouth daily. 90 tablet 1  . bisoprolol (ZEBETA) 10 MG tablet Take 1 tablet (10 mg total) by mouth every evening. 90 tablet 1  . blood glucose meter kit and supplies KIT Use to check blood sugar three times daily. DX: E11.8 1 each 0  . Cholecalciferol 2000 units TABS Take 1 tablet (2,000 Units total) by mouth daily. 90 tablet 1  . cyanocobalamin 2000 MCG tablet Take 1 tablet (2,000 mcg total) by mouth daily. 90 tablet 1  . cycloSPORINE (RESTASIS) 0.05 % ophthalmic emulsion Place 1 drop into both eyes 2 (two) times daily. (Patient taking differently: Place 1 drop into both eyes daily. ) 0.4 mL 5  . Empagliflozin-metFORMIN HCl (SYNJARDY) 01-999 MG TABS Take 1 tablet by mouth 2 (two) times daily. 180 tablet 1  . ferrous sulfate (FEROSUL) 325 (65 FE) MG tablet Take 1 tablet (325 mg total) by mouth 2 (two) times daily with a meal. 180 tablet 1  . fluconazole (DIFLUCAN) 200 MG tablet Take 1 tablet (200 mg total) by mouth once a week. 12 tablet 0  . furosemide (LASIX) 80 MG tablet Take 1.5 tablets (120 mg total) by mouth every morning AND 1 tablet (80 mg total) at bedtime. 180 tablet 3  . gabapentin (NEURONTIN) 100 MG capsule Take 1 capsule (100 mg total) by mouth 3 (three) times  daily. 270 capsule 1  . glucose blood (COOL BLOOD GLUCOSE TEST STRIPS) test strip Use to check blood sugar three times daily. DX: E11.8 100 each 12  . Insulin Pen Needle (ULTICARE SHORT PEN NEEDLES) 31G X 8 MM MISC USE WITH INSULIN PENS DAILY. 100 each 3  . levothyroxine (SYNTHROID) 50 MCG tablet TAKE 1 TABLET BY MOUTH DAILY BEFORE BREAKFAST. 90 tablet 0  . linaclotide (LINZESS) 290 MCG CAPS capsule Take 1 capsule (290 mcg total) by mouth daily before breakfast. 90 capsule 1  . losartan (COZAAR) 25 MG tablet Take 2 tablets (50 mg total) by mouth daily. NEED FOLLOW UP APPT WITH DR [facility] [phone] 180 tablet 1  . Multiple Vitamins-Minerals (MULTIVITAMIN ADULT PO) Take by mouth.    Marland Kitchen omeprazole (PRILOSEC) 40 MG capsule Take 1 tablet by mouth 30 min before breakfast. 90 capsule 3  . phentermine 37.5 MG capsule TAKE 1 CAPSULE BY MOUTH EVERY MORNING. 30 capsule 0  . potassium chloride SA (KLOR-CON) 20 MEQ tablet Take 1 tablet (20 mEq total) by mouth 2 (two) times daily. 180 tablet 3  . sildenafil (REVATIO) 20 MG tablet Take 4 tablets (80 mg total) by mouth daily as needed. 60 tablet 5  . thiamine (VITAMIN B-1) 100 MG tablet TAKE 1 TABLET BY MOUTH DAILY. 90 tablet 1  .  VENTOLIN HFA 108 (90 Base) MCG/ACT inhaler INHALE 2 PUFFS INTO THE LUNGS EVERY 6 HOURS AS NEEDED FOR WHEEZING OR SHORTNESS OF BREATH. 18 g 2  . vitamin B-6 (PYRIDOXINE) 25 MG tablet Take 1 tablet (25 mg total) by mouth daily. 90 tablet 1  . zinc gluconate 50 MG tablet Take 1 tablet (50 mg total) by mouth daily. Reported on 01/19/2016 90 tablet 1  . HYDROcodone-acetaminophen (NORCO) 10-325 MG tablet Take 1 tablet by mouth every 8 (eight) hours as needed. 30 tablet 0  . Insulin Detemir (LEVEMIR FLEXTOUCH) 100 UNIT/ML Pen Inject 240 Units into the skin daily. 75 mL 5  . vitamin C (ASCORBIC ACID) 500 MG tablet Take 2 tablets (1,000 mg total) by mouth 2 (two) times daily. Reported on 01/19/2016 180 tablet 1  . Vitamins A & D (VITAMIN A & D)  10000-400 units TABS      No facility-administered medications prior to visit.     ROS Review of Systems  Constitutional: Negative for chills, diaphoresis, fatigue and unexpected weight change.  HENT: Negative.   Eyes: Negative.   Respiratory: Negative for chest tightness, shortness of breath and wheezing.   Cardiovascular: Negative for chest pain, palpitations and leg swelling.  Gastrointestinal: Negative for abdominal pain, constipation, diarrhea, nausea and vomiting.  Endocrine: Negative.   Genitourinary: Negative.  Negative for difficulty urinating.  Musculoskeletal: Positive for arthralgias and back pain. Negative for myalgias.  Skin: Negative.  Negative for color change and pallor.  Neurological: Negative.  Negative for dizziness and weakness.  Hematological: Negative for adenopathy. Does not bruise/bleed easily.  Psychiatric/Behavioral: Negative.     Objective:  BP 118/60 (BP Location: Left Arm, Patient Position: Sitting, Cuff Size: Large)   Pulse 73   Temp 97.9 F (36.6 C) (Oral)   Ht 6' (1.829 m)   Wt (!) 440 lb (199.6 kg)   SpO2 99%   BMI 59.67 kg/m   BP Readings from Last 3 Encounters:  08/06/19 118/60  06/19/19 124/74  05/21/19 (!) 134/58    Wt Readings from Last 3 Encounters:  08/06/19 (!) 440 lb (199.6 kg)  05/21/19 (!) 451 lb (204.6 kg)  04/09/19 (!) 465 lb (210.9 kg)    Physical Exam Vitals signs reviewed.  Constitutional:      Appearance: He is obese.  HENT:     Nose: Nose normal.     Mouth/Throat:     Mouth: Mucous membranes are moist.  Eyes:     General: No scleral icterus.    Conjunctiva/sclera: Conjunctivae normal.  Neck:     Musculoskeletal: Neck supple.  Cardiovascular:     Rate and Rhythm: Normal rate and regular rhythm.     Heart sounds: No murmur.  Pulmonary:     Effort: Pulmonary effort is normal.     Breath sounds: No stridor. No wheezing, rhonchi or rales.  Abdominal:     General: Abdomen is protuberant. Bowel sounds are  normal. There is no distension.     Palpations: Abdomen is soft. There is no hepatomegaly or splenomegaly.     Tenderness: There is no abdominal tenderness.  Musculoskeletal: Normal range of motion.  Lymphadenopathy:     Cervical: No cervical adenopathy.  Skin:    General: Skin is warm.  Neurological:     General: No focal deficit present.     Mental Status: He is alert.  Psychiatric:        Mood and Affect: Mood normal.        Behavior: Behavior  normal.     Lab Results  Component Value Date   WBC 7.4 04/09/2019   HGB 10.7 (L) 04/09/2019   HCT 33.4 (L) 04/09/2019   PLT 238.0 04/09/2019   GLUCOSE 147 (H) 04/09/2019   CHOL 125 03/21/2019   TRIG 122 03/21/2019   HDL 26 (L) 03/21/2019   LDLDIRECT 136.8 12/12/2006   LDLCALC 75 03/21/2019   ALT 16 02/13/2018   AST 23 02/13/2018   NA 137 04/09/2019   K 4.7 04/09/2019   CL 102 04/09/2019   CREATININE 2.46 (H) 04/09/2019   BUN 43 (H) 04/09/2019   CO2 25 04/09/2019   TSH 2.88 04/09/2019   PSA 1.43 11/22/2018   HGBA1C 6.6 (A) 08/06/2019   MICROALBUR 1.0 06/01/2018    Dg Abd Acute 2+v W 1v Chest  Result Date: 04/09/2019 CLINICAL DATA:  Abdominal pain for 3 days. EXAM: DG ABDOMEN ACUTE W/ 1V CHEST COMPARISON:  PA and lateral chest 11/26/2015. Single-view of the chest and plain films the abdomen 01/13/2017. FINDINGS: Single-view of the chest demonstrates clear lungs and normal heart size. No pneumothorax or pleural fluid. Two views of the abdomen show no free intraperitoneal air. Bowel gas pattern is nonobstructive. Lap band is in place. IMPRESSION: No acute abnormality chest or abdomen. Electronically Signed   By: Inge Rise M.D.   On: 04/09/2019 10:10    Assessment & Plan:   [name] was seen today for anemia, hypertension and diabetes.  Diagnoses and all orders for this visit:  B12 deficiency- I will monitor his CBC -     CBC with Differential; Future  Other iron deficiency anemia- I will monitor his H&H -     CBC  with Differential; Future  Type II diabetes mellitus with manifestations ([place])- His A1c is down to 6.6% and he had a recent blood sugar of 39.  I recommended that he decrease his basal insulin dose to 200 units a day. -     Basic metabolic panel; Future -     Microalbumin / creatinine urine ratio; Future -     Urinalysis, Routine w reflex microscopic; Future -     POCT HgB A1C  Acquired hypothyroidism- I will monitor his TSH and will adjust his dose if indicated.  Malignant neoplasm of colon, unspecified part of colon [facility])- He is not willing to undergo another colonoscopy.  Left lumbar radiculopathy -     HYDROcodone-acetaminophen (NORCO) 10-325 MG tablet; Take 1 tablet by mouth every 6 (six) hours as needed.  Diabetic peripheral neuropathy (HCC) -     HYDROcodone-acetaminophen (NORCO) 10-325 MG tablet; Take 1 tablet by mouth every 6 (six) hours as needed.  Primary osteoarthritis involving multiple joints -     HYDROcodone-acetaminophen (NORCO) 10-325 MG tablet; Take 1 tablet by mouth every 6 (six) hours as needed.  Diabetic nephropathy associated with type 2 diabetes mellitus ([place]) -     Basic metabolic panel; Future -     Microalbumin / creatinine urine ratio; Future -     Urinalysis, Routine w reflex microscopic; Future  Type 2 diabetes mellitus with complication, without long-term current use of insulin (HCC) -     Insulin Detemir (LEVEMIR FLEXTOUCH) 100 UNIT/ML Pen; Inject 200 Units into the skin daily.   I have discontinued [name]'s Vitamin A & D and vitamin C. I have also changed his HYDROcodone-acetaminophen and Levemir FlexTouch. Additionally, I am having him maintain his vitamin B-6, zinc gluconate, omeprazole, albuterol, thiamine, Multiple Vitamins-Minerals (MULTIVITAMIN ADULT PO), aspirin EC,  cyanocobalamin, Cholecalciferol, Ventolin HFA, cycloSPORINE, linaclotide, ferrous sulfate, Cool Blood Glucose Test Strips, blood glucose meter kit and supplies, fluconazole,  sildenafil, phentermine, UltiCare Short Pen Needles, Synjardy, losartan, gabapentin, bisoprolol, atorvastatin, levothyroxine, potassium chloride SA, furosemide, and Accu-Chek Softclix Lancets.  Meds ordered this encounter  Medications  . HYDROcodone-acetaminophen (NORCO) 10-325 MG tablet    Sig: Take 1 tablet by mouth every 6 (six) hours as needed.    Dispense:  60 tablet    Refill:  0  . Insulin Detemir (LEVEMIR FLEXTOUCH) 100 UNIT/ML Pen    Sig: Inject 200 Units into the skin daily.    Dispense:  75 mL    Refill:  5     Follow-up: No follow-ups on file.  Scarlette Calico, MD

## 2019-08-07 ENCOUNTER — Encounter: Payer: Self-pay | Admitting: Internal Medicine

## 2019-08-07 NOTE — Patient Instructions (Signed)
Type 2 Diabetes Mellitus, Diagnosis, Adult Type 2 diabetes (type 2 diabetes mellitus) is a long-term (chronic) disease. In type 2 diabetes, one or both of these problems may be present:  The pancreas does not make enough of a hormone called insulin.  Cells in the body do not respond properly to insulin that the body makes (insulin resistance). Normally, insulin allows blood sugar (glucose) to enter cells in the body. The cells use glucose for energy. Insulin resistance or lack of insulin causes excess glucose to build up in the blood instead of going into cells. As a result, high blood glucose (hyperglycemia) develops. What increases the risk? The following factors may make you more likely to develop type 2 diabetes:  Having a family member with type 2 diabetes.  Being overweight or obese.  Having an inactive (sedentary) lifestyle.  Having been diagnosed with insulin resistance.  Having a history of prediabetes, gestational diabetes, or polycystic ovary syndrome (PCOS).  Being of American-Indian, African-American, Hispanic/Latino, or Asian/Pacific Islander descent. What are the signs or symptoms? In the early stage of this condition, you may not have symptoms. Symptoms develop slowly and may include:  Increased thirst (polydipsia).  Increased hunger(polyphagia).  Increased urination (polyuria).  Increased urination during the night (nocturia).  Unexplained weight loss.  Frequent infections that keep coming back (recurring).  Fatigue.  Weakness.  Vision changes, such as blurry vision.  Cuts or bruises that are slow to heal.  Tingling or numbness in the hands or feet.  Dark patches on the skin (acanthosis nigricans). How is this diagnosed? This condition is diagnosed based on your symptoms, your medical history, a physical exam, and your blood glucose level. Your blood glucose may be checked with one or more of the following blood tests:  A fasting blood glucose (FBG)  test. You will not be allowed to eat (you will fast) for 8 hours or longer before a blood sample is taken.  A random blood glucose test. This test checks blood glucose at any time of day regardless of when you ate.  An A1c (hemoglobin A1c) blood test. This test provides information about blood glucose control over the previous 2-3 months.  An oral glucose tolerance test (OGTT). This test measures your blood glucose at two times: ? After fasting. This is your baseline blood glucose level. ? Two hours after drinking a beverage that contains glucose. You may be diagnosed with type 2 diabetes if:  Your FBG level is 126 mg/dL (7.0 mmol/L) or higher.  Your random blood glucose level is 200 mg/dL (11.1 mmol/L) or higher.  Your A1c level is 6.5% or higher.  Your OGTT result is higher than 200 mg/dL (11.1 mmol/L). These blood tests may be repeated to confirm your diagnosis. How is this treated? Your treatment may be managed by a specialist called an endocrinologist. Type 2 diabetes may be treated by following instructions from your health care provider about:  Making diet and lifestyle changes. This may include: ? Following an individualized nutrition plan that is developed by a diet and nutrition specialist (registered dietitian). ? Exercising regularly. ? Finding ways to manage stress.  Checking your blood glucose level as often as told.  Taking diabetes medicines or insulin daily. This helps to keep your blood glucose levels in the healthy range. ? If you use insulin, you may need to adjust the dosage depending on how physically active you are and what foods you eat. Your health care provider will tell you how to adjust your dosage.    Taking medicines to help prevent complications from diabetes, such as: ? Aspirin. ? Medicine to lower cholesterol. ? Medicine to control blood pressure. Your health care provider will set individualized treatment goals for you. Your goals will be based on  your age, other medical conditions you have, and how you respond to diabetes treatment. Generally, the goal of treatment is to maintain the following blood glucose levels:  Before meals (preprandial): 80-130 mg/dL (4.4-7.2 mmol/L).  After meals (postprandial): below 180 mg/dL (10 mmol/L).  A1c level: less than 7%. Follow these instructions at home: Questions to ask your health care provider  Consider asking the following questions: ? Do I need to meet with a diabetes educator? ? Where can I find a support group for people with diabetes? ? What equipment will I need to manage my diabetes at home? ? What diabetes medicines do I need, and when should I take them? ? How often do I need to check my blood glucose? ? What number can I call if I have questions? ? When is my next appointment? General instructions  Take over-the-counter and prescription medicines only as told by your health care provider.  Keep all follow-up visits as told by your health care provider. This is important.  For more information about diabetes, visit: ? American Diabetes Association (ADA): www.diabetes.org ? American Association of Diabetes Educators (AADE): www.diabeteseducator.org Contact a health care provider if:  Your blood glucose is at or above 240 mg/dL (13.3 mmol/L) for 2 days in a row.  You have been sick or have had a fever for 2 days or longer, and you are not getting better.  You have any of the following problems for more than 6 hours: ? You cannot eat or drink. ? You have nausea and vomiting. ? You have diarrhea. Get help right away if:  Your blood glucose is lower than 54 mg/dL (3.0 mmol/L).  You become confused or you have trouble thinking clearly.  You have difficulty breathing.  You have moderate or large ketone levels in your urine. Summary  Type 2 diabetes (type 2 diabetes mellitus) is a long-term (chronic) disease. In type 2 diabetes, the pancreas does not make enough of a  hormone called insulin, or cells in the body do not respond properly to insulin that the body makes (insulin resistance).  This condition is treated by making diet and lifestyle changes and taking diabetes medicines or insulin.  Your health care provider will set individualized treatment goals for you. Your goals will be based on your age, other medical conditions you have, and how you respond to diabetes treatment.  Keep all follow-up visits as told by your health care provider. This is important. This information is not intended to replace advice given to you by your health care provider. Make sure you discuss any questions you have with your health care provider. Document Released: 09/05/2005 Document Revised: 11/03/2017 Document Reviewed: 10/09/2015 Elsevier Patient Education  2020 [employer]  

## 2019-08-11 DIAGNOSIS — G4733 Obstructive sleep apnea (adult) (pediatric): Secondary | ICD-10-CM | POA: Diagnosis not present

## 2019-08-19 ENCOUNTER — Other Ambulatory Visit: Payer: Self-pay | Admitting: Internal Medicine

## 2019-08-23 DIAGNOSIS — H5213 Myopia, bilateral: Secondary | ICD-10-CM | POA: Diagnosis not present

## 2019-08-23 DIAGNOSIS — H52223 Regular astigmatism, bilateral: Secondary | ICD-10-CM | POA: Diagnosis not present

## 2019-08-28 ENCOUNTER — Ambulatory Visit: Payer: Self-pay | Admitting: *Deleted

## 2019-08-28 NOTE — Telephone Encounter (Signed)
Contacted [name] per his initial encounter; he has swollen hernia (right abdominal hernia) "that had developed a bump on it"; the [name] says that every time he gets constipated his hernia gets bigger; the [name] says he can not sleep due to pain; he seen at [facility] for adjustment of his lap band; he feels his lap band is squeezing his intestines, and blocks him a BM; he has taken Dulcolax and OTC senocot 8.6 mg; his last BM 08/27/2019; he says that he had previously spoken with Dr [name] regarding this issue; [name] began using profane language, and hung up before recommendations can be made; he sees Dr Scarlette Calico, [facility]; will route to office for final disposition.

## 2019-08-28 NOTE — Telephone Encounter (Signed)
Pt has an appt scheduled for tomorrow.

## 2019-08-29 ENCOUNTER — Ambulatory Visit (INDEPENDENT_AMBULATORY_CARE_PROVIDER_SITE_OTHER)
Admission: RE | Admit: 2019-08-29 | Discharge: 2019-08-29 | Disposition: A | Discharge status: Home / Self Care | Payer: Medicare HMO | Source: Ambulatory Visit | Attending: Internal Medicine | Admitting: Internal Medicine

## 2019-08-29 ENCOUNTER — Encounter: Payer: Self-pay | Admitting: Internal Medicine

## 2019-08-29 ENCOUNTER — Ambulatory Visit (INDEPENDENT_AMBULATORY_CARE_PROVIDER_SITE_OTHER): Payer: Medicare HMO | Admitting: Internal Medicine

## 2019-08-29 ENCOUNTER — Other Ambulatory Visit: Payer: Self-pay

## 2019-08-29 VITALS — BP 122/62 | HR 72 | Temp 98.3°F | Ht 72.0 in

## 2019-08-29 DIAGNOSIS — K5904 Chronic idiopathic constipation: Secondary | ICD-10-CM | POA: Diagnosis not present

## 2019-08-29 DIAGNOSIS — T402X5A Adverse effect of other opioids, initial encounter: Secondary | ICD-10-CM

## 2019-08-29 DIAGNOSIS — D508 Other iron deficiency anemias: Secondary | ICD-10-CM

## 2019-08-29 DIAGNOSIS — E519 Thiamine deficiency, unspecified: Secondary | ICD-10-CM

## 2019-08-29 DIAGNOSIS — E538 Deficiency of other specified B group vitamins: Secondary | ICD-10-CM

## 2019-08-29 DIAGNOSIS — K5903 Drug induced constipation: Secondary | ICD-10-CM | POA: Diagnosis not present

## 2019-08-29 DIAGNOSIS — E039 Hypothyroidism, unspecified: Secondary | ICD-10-CM

## 2019-08-29 DIAGNOSIS — M65312 Trigger thumb, left thumb: Secondary | ICD-10-CM | POA: Insufficient documentation

## 2019-08-29 DIAGNOSIS — Z6841 Body Mass Index (BMI) 40.0 and over, adult: Secondary | ICD-10-CM

## 2019-08-29 DIAGNOSIS — R10813 Right lower quadrant abdominal tenderness: Secondary | ICD-10-CM

## 2019-08-29 DIAGNOSIS — M65311 Trigger thumb, right thumb: Secondary | ICD-10-CM

## 2019-08-29 DIAGNOSIS — R1031 Right lower quadrant pain: Secondary | ICD-10-CM | POA: Diagnosis not present

## 2019-08-29 DIAGNOSIS — E662 Morbid (severe) obesity with alveolar hypoventilation: Secondary | ICD-10-CM

## 2019-08-29 MED ORDER — LUBIPROSTONE 24 MCG PO CAPS: 24.0000 ug | ORAL_CAPSULE | Freq: Two times a day (BID) | ORAL | 0 refills | Status: DC

## 2019-08-29 NOTE — Progress Notes (Addendum)
Subjective:  Patient ID: [name], male    DOB: 1956/07/22  Age: 63 y.o. MRN: [id number]  CC: Abdominal Pain  This visit occurred during the SARS-CoV-2 public health emergency.  Safety protocols were in place, including screening questions prior to the visit, additional usage of staff PPE, and extensive cleaning of exam room while observing appropriate contact time as indicated for disinfecting solutions.   HPI [name] presents for a 2-week history of abdominal pain.  He describes an intermittent stabbing sensation in his right lower quadrant and feels like his ventral abdominal hernia is more prominent.  He also complains of constipation that has not responded to Linzess so he has been using Colace and laxatives.  Outpatient Medications Prior to Visit  Medication Sig Dispense Refill  . Accu-Chek Softclix Lancets lancets CHECK BLOOD SUGAR THREE TIMES DAILY 100 each 2  . albuterol (PROVENTIL HFA;VENTOLIN HFA) 108 (90 Base) MCG/ACT inhaler Inhale 2 puffs into the lungs every 6 (six) hours as needed for wheezing or shortness of breath. 1 Inhaler 1  . aspirin EC 81 MG tablet     . atorvastatin (LIPITOR) 40 MG tablet Take 1 tablet (40 mg total) by mouth daily. 90 tablet 1  . bisoprolol (ZEBETA) 10 MG tablet Take 1 tablet (10 mg total) by mouth every evening. 90 tablet 1  . blood glucose meter kit and supplies KIT Use to check blood sugar three times daily. DX: E11.8 1 each 0  . Cholecalciferol 2000 units TABS Take 1 tablet (2,000 Units total) by mouth daily. 90 tablet 1  . cyanocobalamin 2000 MCG tablet Take 1 tablet (2,000 mcg total) by mouth daily. 90 tablet 1  . cycloSPORINE (RESTASIS) 0.05 % ophthalmic emulsion Place 1 drop into both eyes 2 (two) times daily. (Patient taking differently: Place 1 drop into both eyes daily. ) 0.4 mL 5  . Empagliflozin-metFORMIN HCl (SYNJARDY) 01-999 MG TABS Take 1 tablet by mouth 2 (two) times daily. 180 tablet 1  . ferrous sulfate (FEROSUL) 325 (65  FE) MG tablet Take 1 tablet (325 mg total) by mouth 2 (two) times daily with a meal. 180 tablet 1  . fluconazole (DIFLUCAN) 200 MG tablet Take 1 tablet (200 mg total) by mouth once a week. 12 tablet 0  . furosemide (LASIX) 80 MG tablet Take 1.5 tablets (120 mg total) by mouth every morning AND 1 tablet (80 mg total) at bedtime. 180 tablet 3  . gabapentin (NEURONTIN) 100 MG capsule Take 1 capsule (100 mg total) by mouth 3 (three) times daily. 270 capsule 1  . glucose blood (COOL BLOOD GLUCOSE TEST STRIPS) test strip Use to check blood sugar three times daily. DX: E11.8 100 each 12  . HYDROcodone-acetaminophen (NORCO) 10-325 MG tablet Take 1 tablet by mouth every 6 (six) hours as needed. 60 tablet 0  . Insulin Detemir (LEVEMIR FLEXTOUCH) 100 UNIT/ML Pen Inject 200 Units into the skin daily. 75 mL 5  . Insulin Pen Needle (ULTICARE SHORT PEN NEEDLES) 31G X 8 MM MISC USE WITH INSULIN PENS DAILY. 100 each 3  . levothyroxine (SYNTHROID) 50 MCG tablet TAKE 1 TABLET BY MOUTH DAILY BEFORE BREAKFAST. 90 tablet 0  . losartan (COZAAR) 25 MG tablet Take 2 tablets (50 mg total) by mouth daily. NEED FOLLOW UP APPT WITH DR [facility] [phone] 180 tablet 1  . Multiple Vitamins-Minerals (MULTIVITAMIN ADULT PO) Take by mouth.    Marland Kitchen omeprazole (PRILOSEC) 40 MG capsule Take 1 tablet by mouth 30 min before breakfast. 90 capsule  3  . phentermine 37.5 MG capsule TAKE 1 CAPSULE BY MOUTH EVERY MORNING. 30 capsule 0  . potassium chloride SA (KLOR-CON) 20 MEQ tablet Take 1 tablet (20 mEq total) by mouth 2 (two) times daily. 180 tablet 3  . sildenafil (REVATIO) 20 MG tablet Take 4 tablets (80 mg total) by mouth daily as needed. 60 tablet 5  . thiamine (VITAMIN B-1) 100 MG tablet TAKE 1 TABLET BY MOUTH DAILY. 90 tablet 1  . VENTOLIN HFA 108 (90 Base) MCG/ACT inhaler INHALE 2 PUFFS INTO THE LUNGS EVERY 6 HOURS AS NEEDED FOR WHEEZING OR SHORTNESS OF BREATH. 18 g 2  . vitamin B-6 (PYRIDOXINE) 25 MG tablet Take 1 tablet (25 mg total)  by mouth daily. 90 tablet 1  . zinc gluconate 50 MG tablet Take 1 tablet (50 mg total) by mouth daily. Reported on 01/19/2016 90 tablet 1  . linaclotide (LINZESS) 290 MCG CAPS capsule Take 1 capsule (290 mcg total) by mouth daily before breakfast. 90 capsule 1   No facility-administered medications prior to visit.    ROS Review of Systems  Constitutional: Negative.  Negative for chills, diaphoresis, fatigue and fever.  HENT: Negative.   Eyes: Negative for visual disturbance.  Respiratory: Negative for cough and shortness of breath.   Cardiovascular: Negative for chest pain, palpitations and leg swelling.  Gastrointestinal: Positive for abdominal pain and constipation. Negative for blood in stool, diarrhea, nausea and vomiting.  Endocrine: Negative.   Genitourinary: Negative.  Negative for difficulty urinating.  Musculoskeletal: Negative.   Skin: Negative.   Neurological: Negative.  Negative for dizziness.  Hematological: Negative for adenopathy. Does not bruise/bleed easily.  Psychiatric/Behavioral: Negative.     Objective:  BP 122/62 (BP Location: Left Arm, Patient Position: Sitting, Cuff Size: Large)   Pulse 72   Temp 98.3 F (36.8 C) (Oral)   Ht 6' (1.829 m)   SpO2 98%   BMI 59.67 kg/m   BP Readings from Last 3 Encounters:  08/29/19 122/62  08/06/19 118/60  06/19/19 124/74    Wt Readings from Last 3 Encounters:  08/06/19 (!) 440 lb (199.6 kg)  05/21/19 (!) 451 lb (204.6 kg)  04/09/19 (!) 465 lb (210.9 kg)    Physical Exam Constitutional:      General: He is not in acute distress.    Appearance: He is obese. He is not toxic-appearing or diaphoretic.  HENT:     Nose: Nose normal.     Mouth/Throat:     Mouth: Mucous membranes are moist.  Eyes:     Conjunctiva/sclera: Conjunctivae normal.  Cardiovascular:     Rate and Rhythm: Normal rate and regular rhythm.     Heart sounds: No murmur.  Pulmonary:     Effort: Pulmonary effort is normal.     Breath sounds: No  stridor. No wheezing or rales.  Abdominal:     General: Abdomen is protuberant. Bowel sounds are normal. There is no distension.     Palpations: Abdomen is soft. There is no fluid wave, hepatomegaly, splenomegaly, mass or pulsatile mass.     Tenderness: There is abdominal tenderness in the right lower quadrant. There is no guarding or rebound.     Hernia: A hernia is present. Hernia is present in the ventral area.     Comments: There is a reducible ventral hernia in the epigastric region  Musculoskeletal:        General: Normal range of motion.     Cervical back: Neck supple.  Right lower leg: No edema.     Left lower leg: No edema.  Lymphadenopathy:     Cervical: No cervical adenopathy.  Skin:    General: Skin is warm and dry.     Coloration: Skin is not pale.     Findings: No rash.  Neurological:     General: No focal deficit present.     Mental Status: He is alert.     Lab Results  Component Value Date   WBC 7.4 04/09/2019   HGB 10.7 (L) 04/09/2019   HCT 33.4 (L) 04/09/2019   PLT 238.0 04/09/2019   GLUCOSE 147 (H) 04/09/2019   CHOL 125 03/21/2019   TRIG 122 03/21/2019   HDL 26 (L) 03/21/2019   LDLDIRECT 136.8 12/12/2006   LDLCALC 75 03/21/2019   ALT 16 02/13/2018   AST 23 02/13/2018   NA 137 04/09/2019   K 4.7 04/09/2019   CL 102 04/09/2019   CREATININE 2.46 (H) 04/09/2019   BUN 43 (H) 04/09/2019   CO2 25 04/09/2019   TSH 2.88 04/09/2019   PSA 1.43 11/22/2018   HGBA1C 6.6 (A) 08/06/2019   MICROALBUR 1.0 06/01/2018    DG ABD ACUTE 2+V W 1V CHEST  Result Date: 04/09/2019 CLINICAL DATA:  Abdominal pain for 3 days. EXAM: DG ABDOMEN ACUTE W/ 1V CHEST COMPARISON:  PA and lateral chest 11/26/2015. Single-view of the chest and plain films the abdomen 01/13/2017. FINDINGS: Single-view of the chest demonstrates clear lungs and normal heart size. No pneumothorax or pleural fluid. Two views of the abdomen show no free intraperitoneal air. Bowel gas pattern is  nonobstructive. Lap band is in place. IMPRESSION: No acute abnormality chest or abdomen. Electronically Signed   By: Inge Rise M.D.   On: 04/09/2019 10:10   No results found.   Assessment & Plan:   Chayton was seen today for abdominal pain.  Diagnoses and all orders for this visit:  Acquired hypothyroidism  Class 3 obesity with alveolar hypoventilation, serious comorbidity, and body mass index (BMI) of 50.0 to 59.9 in adult (HCC)  B12 deficiency  Other iron deficiency anemia  Thiamine deficiency  Trigger thumb of both hands -     Cancel: Ambulatory referral to Orthopedic Surgery  Right lower quadrant abdominal tenderness without rebound tenderness- His exam is not consistent with an acute abdominal process.  Plain films show stool retention.  He is not responding well to [place]ms so I have asked him to give Amitiza try. -     DG Abd Acute W/Chest; Future  Chronic idiopathic constipation- He has not responded well to [place].  There may be some component of opioid-induced constipation.  I have therefore recommended that he try Amitiza. -     lubiprostone (AMITIZA) 24 MCG capsule; Take 1 capsule (24 mcg total) by mouth 2 (two) times daily with a meal.  Therapeutic opioid-induced constipation (OIC) -     lubiprostone (AMITIZA) 24 MCG capsule; Take 1 capsule (24 mcg total) by mouth 2 (two) times daily with a meal.   I have discontinued Kaelan Hackman's linaclotide. I am also having him start on lubiprostone. Additionally, I am having him maintain his vitamin B-6, zinc gluconate, omeprazole, albuterol, thiamine, Multiple Vitamins-Minerals (MULTIVITAMIN ADULT PO), aspirin EC, cyanocobalamin, Cholecalciferol, Ventolin HFA, cycloSPORINE, ferrous sulfate, Cool Blood Glucose Test Strips, blood glucose meter kit and supplies, fluconazole, sildenafil, UltiCare Short Pen Needles, Synjardy, losartan, gabapentin, bisoprolol, atorvastatin, levothyroxine, potassium chloride SA, furosemide,  Accu-Chek Softclix Lancets, HYDROcodone-acetaminophen, Levemir FlexTouch, and phentermine.  Meds  ordered this encounter  Medications  . lubiprostone (AMITIZA) 24 MCG capsule    Sig: Take 1 capsule (24 mcg total) by mouth 2 (two) times daily with a meal.    Dispense:  112 capsule    Refill:  0     Follow-up: Return in about 3 weeks (around 09/19/2019).  Scarlette Calico, MD

## 2019-08-29 NOTE — Patient Instructions (Addendum)

## 2019-09-06 ENCOUNTER — Ambulatory Visit: Payer: Self-pay

## 2019-09-06 ENCOUNTER — Other Ambulatory Visit: Payer: Self-pay

## 2019-09-06 NOTE — Patient Outreach (Signed)
[facility]) Care Management  09/06/2019  [name] [birth year] [id number]   Telephone Assessment    Outreach attempt #1 to patient.Spoke with patient. He denies any acute issues or concerns at present. He voices some frustration regarding having hernia surgery. He voices that surgeon told him he was not a candidate for surgery as he needed to lose weight first. However, he voices that his cardiologist cleared him for surgery.Patient states that his hernia has been bothering him and he has "bumps" in his stomach at times. He voices he is considering getting second surgical opinion at [facility]. Patient voices that he saw PCP on last week to discuss hernia and constipation issue. He was started on Amitiza during visit. Patient voices that he is taking meds however med is not helping. He voices that he has not had a BM in over a week. RN CM reviewed nonpharmcological measures to aide in regular BMs including high fiber diet. Patient endorse that he is eating a lot of salads and veggies. Advised patient that he needed to alert MD of constipation issue and patient preferred that RN CM call office for him. He states that his cbgs are running in the 90s most of the time. Last A1C was 6.6(Nov 2020). He denies any further RN CM needs or concerns at this time.   THN CM Care Plan Problem One     Most Recent Value  Care Plan Problem One  Knowledge deficit related to Diabetes and A1C managment.   Role Documenting the Problem One  Care Management Telephonic Coordinator  Care Plan for Problem One  Active  [facility] Long Term Goal   The patient will report maintaining A1C level of 7 or less over the next 90 days.  THN Long Term Goal Start Date  09/06/19  Interventions for Problem One Long Term Goal  RNCM assessed cbg montioring and values. RN CM discussed A1C currentl level and goal. RN CM reinforced diabetic education.    THN CM Care Plan Problem Two     Most Recent Value  Care Plan  Problem Two  Patient with hernia and constipation issues.  Role Documenting the Problem Two  Care Management [place] for Problem Two  Active  Interventions for Problem Two Long Term Goal   RN CM discussed hernia surgery with patient and most recent MD recommendations.  THN Long Term Goal  Patient will report being approved for hernia surgery over the next 90 days.  THN Long Term Goal Start Date  09/06/19  [facility] CM Short Term Goal #1   Patient will report contacting surgeon office for second surgical opinion/eval.  THN CM Short Term Goal #1 Start Date  09/06/19  Interventions for Short Term Goal #2   RN CM discussed with pt treatment options-rsisks and benefits  THN CM Short Term Goal #2   Patient will report having BM at least every 3-4 days over the next 30 days.  THN CM Short Term Goal #2 Start Date  09/06/19  Interventions for Short Term Goal #2  RNCM reviewed med mgmt and sxs measures. RN CM reinforced high fiber diet and nonpharmacological measures to aide in relief. RNCM discussed with pt when to alert MD of changes in condition and contacted MD office to advise them of patient's BM current status.       Plan: RN CM placed call to PCP office(spoke with Abby) to advise them of constipation issues and requested office follow up with patient. RN  CM discussed with patient next outreach within the month of January. Patient gave verbal consent and in agreement with RN CM follow up and timeframe. Patient aware that they may contact RN CM sooner for any issues or concerns. RN CM will send quarterly update to PCP.   Enzo Montgomery, RN,BSN,CCM [facility] Telephonic Care Management Coordinator Direct Phone: [phone] Toll Free: [phone] Fax: [phone]

## 2019-09-06 NOTE — Telephone Encounter (Signed)
Patient called stating that he is constipated. He states its because he has a hernia above his umbilicus. He has seen Dr [name] for this issue and taking Amitiza. He states he had a BM Monday and yesterday. He states that the [facility] yesterday was forces and large. He denies rectal pain. He states he knows he is constipated because his hernia is full and it is uncomfortable. He states he rubs and rubs but it just does not go down. He rates the uncomfortable feeling at 8. Per protocol patient was told to go to ER for evaluation of his symptoms and hernia. He has refused stating that he will continue his Amitiza and see if it helps. Call was place to office for advice. Patient was instructed that ER was best place for care at this time. He verbalized understanding but refuses disposition.   Reason for Disposition . [1] SEVERE pain AND [2] age > 32  Answer Assessment - Initial Assessment Questions 1. STOOL PATTERN OR FREQUENCY: "How often do you pass bowel movements (BMs)?"  (Normal range: tid to q 3 days)  "When was the last BM passed?"       2x a month 2. STRAINING: "Do you have to strain to have a BM?"     straining 3. RECTAL PAIN: "Does your rectum hurt when the stool comes out?" If so, ask: "Do you have hemorrhoids? How bad is the pain?"  (Scale 1-10; or mild, moderate, severe)     no 4. STOOL COMPOSITION: "Are the stools hard?"      soft 5. BLOOD ON STOOLS: "Has there been any blood on the toilet tissue or on the surface of the BM?" If so, ask: "When was the last time?"      no 6. CHRONIC CONSTIPATION: "Is this a new problem for you?"  If no, ask: "How long have you had this problem?" (days, weeks, months)      Yesterday  7. CHANGES IN DIET: "Have there been any recent changes in your diet?"     no 8. MEDICATIONS: "Have you been taking any new medications?"     New to help have a stool 9. LAXATIVES: "Have you been using any laxatives or enemas?"  If yes, ask "What, how often, and when was the last  time?"     amitiza 10. CAUSE: "What do you think is causing the constipation?"        hurnia 11. OTHER SYMPTOMS: "Do you have any other symptoms?" (e.g., abdominal pain, fever, vomiting)       Abdominal hernia  12. PREGNANCY: "Is there any chance you are pregnant?" "When was your last menstrual period?"      N/A  Answer Assessment - Initial Assessment Questions 1. LOCATION: "Where does it hurt?"      Hernia middle above umbilicus 2. RADIATION: "Does the pain shoot anywhere else?" (e.g., chest, back)    uncomfortable 3. ONSET: "When did the pain begin?" (Minutes, hours or days ago)      With constipation 4. SUDDEN: "Gradual or sudden onset?"     gradual 5. PATTERN "Does the pain come and go, or is it constant?"    - If constant: "Is it getting better, staying the same, or worsening?"      (Note: Constant means the pain never goes away completely; most serious pain is constant and it progresses)     - If intermittent: "How long does it last?" "Do you have pain now?"     (Note: Intermittent  means the pain goes away completely between bouts)    constant with bloating 6. SEVERITY: "How bad is the pain?"  (e.g., Scale 1-10; mild, moderate, or severe)    - MILD (1-3): doesn't interfere with normal activities, abdomen soft and not tender to touch     - MODERATE (4-7): interferes with normal activities or awakens from sleep, tender to touch     - SEVERE (8-10): excruciating pain, doubled over, unable to do any normal activities       8 7. RECURRENT SYMPTOM: "Have you ever had this type of abdominal pain before?" If so, ask: "When was the last time?" and "What happened that time?"      Yes with hernia and constipation 8. CAUSE: "What do you think is causing the abdominal pain?"     hernia 9. RELIEVING/AGGRAVATING FACTORS: "What makes it better or worse?" (e.g., movement, antacids, bowel movement)     bm when it all evauated 10. OTHER SYMPTOMS: "Has there been any vomiting, diarrhea,  constipation, or urine problems?"       constipation  Protocols used: ABDOMINAL PAIN - MALE-A-AH, CONSTIPATION-A-AH

## 2019-09-09 ENCOUNTER — Ambulatory Visit: Payer: Medicare HMO

## 2019-09-09 DIAGNOSIS — J449 Chronic obstructive pulmonary disease, unspecified: Secondary | ICD-10-CM | POA: Diagnosis not present

## 2019-09-10 DIAGNOSIS — G4733 Obstructive sleep apnea (adult) (pediatric): Secondary | ICD-10-CM | POA: Diagnosis not present

## 2019-09-11 ENCOUNTER — Ambulatory Visit: Payer: Medicare HMO

## 2019-09-16 ENCOUNTER — Telehealth: Payer: Self-pay

## 2019-09-16 NOTE — Telephone Encounter (Signed)
Copied from [place] [id number]. Topic: Appointment Scheduling - Scheduling Inquiry for Clinic >> Sep 16, 2019  2:58 PM Rayann Heman wrote: Reason for CRM: pt called and stated that he would like to schedule a covid anti body test. Please advise   F/U  Call the patient will the phone number to set up [place] appt [phone].

## 2019-09-18 ENCOUNTER — Ambulatory Visit: Payer: Medicare HMO | Admitting: Podiatry

## 2019-09-23 ENCOUNTER — Ambulatory Visit (HOSPITAL_COMMUNITY)
Admission: RE | Admit: 2019-09-23 | Discharge: 2019-09-23 | Disposition: A | Discharge status: Home / Self Care | Payer: Medicare Other | Source: Ambulatory Visit | Attending: Cardiology | Admitting: Cardiology

## 2019-09-23 ENCOUNTER — Encounter (HOSPITAL_COMMUNITY): Payer: Self-pay | Admitting: Cardiology

## 2019-09-23 ENCOUNTER — Ambulatory Visit (HOSPITAL_COMMUNITY): Payer: Medicare Other

## 2019-09-23 ENCOUNTER — Other Ambulatory Visit: Payer: Self-pay

## 2019-09-23 VITALS — BP 124/59 | HR 94 | Wt >= 6400 oz

## 2019-09-23 DIAGNOSIS — E1142 Type 2 diabetes mellitus with diabetic polyneuropathy: Secondary | ICD-10-CM | POA: Insufficient documentation

## 2019-09-23 DIAGNOSIS — I130 Hypertensive heart and chronic kidney disease with heart failure and stage 1 through stage 4 chronic kidney disease, or unspecified chronic kidney disease: Secondary | ICD-10-CM | POA: Insufficient documentation

## 2019-09-23 DIAGNOSIS — Z91013 Allergy to seafood: Secondary | ICD-10-CM | POA: Insufficient documentation

## 2019-09-23 DIAGNOSIS — Z85038 Personal history of other malignant neoplasm of large intestine: Secondary | ICD-10-CM | POA: Diagnosis not present

## 2019-09-23 DIAGNOSIS — Z6841 Body Mass Index (BMI) 40.0 and over, adult: Secondary | ICD-10-CM | POA: Diagnosis not present

## 2019-09-23 DIAGNOSIS — Z79890 Hormone replacement therapy (postmenopausal): Secondary | ICD-10-CM | POA: Diagnosis not present

## 2019-09-23 DIAGNOSIS — N183 Chronic kidney disease, stage 3 unspecified: Secondary | ICD-10-CM | POA: Insufficient documentation

## 2019-09-23 DIAGNOSIS — M7989 Other specified soft tissue disorders: Secondary | ICD-10-CM

## 2019-09-23 DIAGNOSIS — Z79899 Other long term (current) drug therapy: Secondary | ICD-10-CM | POA: Insufficient documentation

## 2019-09-23 DIAGNOSIS — I5032 Chronic diastolic (congestive) heart failure: Secondary | ICD-10-CM | POA: Diagnosis not present

## 2019-09-23 DIAGNOSIS — Z833 Family history of diabetes mellitus: Secondary | ICD-10-CM | POA: Insufficient documentation

## 2019-09-23 DIAGNOSIS — E785 Hyperlipidemia, unspecified: Secondary | ICD-10-CM | POA: Insufficient documentation

## 2019-09-23 DIAGNOSIS — D649 Anemia, unspecified: Secondary | ICD-10-CM | POA: Insufficient documentation

## 2019-09-23 DIAGNOSIS — G4733 Obstructive sleep apnea (adult) (pediatric): Secondary | ICD-10-CM | POA: Diagnosis not present

## 2019-09-23 DIAGNOSIS — E1122 Type 2 diabetes mellitus with diabetic chronic kidney disease: Secondary | ICD-10-CM | POA: Diagnosis not present

## 2019-09-23 DIAGNOSIS — R600 Localized edema: Secondary | ICD-10-CM | POA: Insufficient documentation

## 2019-09-23 DIAGNOSIS — E11621 Type 2 diabetes mellitus with foot ulcer: Secondary | ICD-10-CM | POA: Insufficient documentation

## 2019-09-23 DIAGNOSIS — Z7982 Long term (current) use of aspirin: Secondary | ICD-10-CM | POA: Insufficient documentation

## 2019-09-23 DIAGNOSIS — Z888 Allergy status to other drugs, medicaments and biological substances status: Secondary | ICD-10-CM | POA: Insufficient documentation

## 2019-09-23 DIAGNOSIS — Z9049 Acquired absence of other specified parts of digestive tract: Secondary | ICD-10-CM | POA: Insufficient documentation

## 2019-09-23 DIAGNOSIS — Z794 Long term (current) use of insulin: Secondary | ICD-10-CM | POA: Diagnosis not present

## 2019-09-23 DIAGNOSIS — Z8249 Family history of ischemic heart disease and other diseases of the circulatory system: Secondary | ICD-10-CM | POA: Diagnosis not present

## 2019-09-23 LAB — BASIC METABOLIC PANEL
Anion gap: 12 (ref 5–15)
BUN: 30 mg/dL — ABNORMAL HIGH (ref 8–23)
CO2: 27 mmol/L (ref 22–32)
Calcium: 9.6 mg/dL (ref 8.9–10.3)
Chloride: 98 mmol/L (ref 98–111)
Creatinine, Ser: 2.41 mg/dL — ABNORMAL HIGH (ref 0.61–1.24)
GFR calc Af Amer: 32 mL/min — ABNORMAL LOW (ref >60)
GFR calc non Af Amer: 28 mL/min — ABNORMAL LOW (ref >60)
Glucose, Bld: 142 mg/dL — ABNORMAL HIGH (ref 70–99)
Potassium: 4 mmol/L (ref 3.5–5.1)
Sodium: 137 mmol/L (ref 135–145)

## 2019-09-23 NOTE — Progress Notes (Signed)
ID:  [name], DOB 12/17/1955, MRN [id number]   Provider location: [place] [facility] Type of Visit: Established patient   PCP:  [name], MD  Cardiologist:  Dr. Aundra Dubin  Chief Complaint: Exertional shortness of breath   History of Present Illness: [name] is a 64 y.o. male who has history of morbid obesity, diabetes, and diastolic CHF.   He still has limited mobility due to size and uses a rolling walker in the house and a power chair when he goes out. He still gets short of breath when he walks with walker around the house.  This is unchanged.  No chest pain.  No lightheadedness. No palpitations.   He has been evaluated to have lap band out and to get gastric sleeve.  He says that this has been postponed multiple times by [facility] and he plans on getting an evaluation at [facility].  He has actually lost 27 lbs since last appointment.   ECG (personally reviewed): NSR, normal  Labs (8/10): creatinine 1.1, LDL 94, HDL 34 Labs (8/11): K 4.8, creatinine 1.2, LDL 93, HDL 33 Labs (10/12): K 5.4, creatinine 1.5, LDL 95, HDL 42 Labs (8/14): LDL 115, HDL 42 Labs (9/14): BNP 68 Labs (10/14): K 4.5, creatinine 1.6 Labs (7/15): K 4.4, creatinine 1.3 Labs (8/15): LDL 136, HDL 37 Labs (11/17): K 4.6, creatinine 1.43, LDL 111, HDL 37 Labs (4/18): K 4.9, creatinine 1.35, hgb 10.5 Labs (1/19): creatinine 2.56 Labs (3/20): K 4, creatinine 1.88, hgb 10.2, HIV negative  Labs (7/20): K 4.7, creatinine 2.46  Past Medical History: 1. Diabetes mellitus.  Has history of diabetic foot ulcer and diabetic peripheral neuropathy. .  2. Hyperlipidemia 3. Hypertension: ACEI cough.  4. Colon cancer: s/p sigmoid colectomy in 2007 5. Morbid obesity s/p lap band procedure.  6. Anemia 7. Obstructive Sleep Apnea: Using CPAP.  8. CHF: Diastolic.  Echo (5/12): EF 50-55%, mild LV dilation, mild LVH, moderate (grade II) diastolic dysfunction.  Echo (9/14)  with EF 50-55%, mild LVH, moderately dilated LV, RV poorly visualized. - Echo (6/19): EF 55-60%, RV poorly visualized.  9. PFTs (9/10): mild obstructive defect 10.  Dobutamine stress echo (9/10): EF 55%, mild LV hypertrophy, no significant valvular disease, no ischemia or infarction  11. CKD stage 3 12. Ventral hernia s/p repair.   Current Outpatient Medications  Medication Sig Dispense Refill  . Accu-Chek Softclix Lancets lancets CHECK BLOOD SUGAR THREE TIMES DAILY 100 each 2  . albuterol (PROVENTIL HFA;VENTOLIN HFA) 108 (90 Base) MCG/ACT inhaler Inhale 2 puffs into the lungs every 6 (six) hours as needed for wheezing or shortness of breath. 1 Inhaler 1  . aspirin EC 81 MG tablet Take 81 mg by mouth daily.     Marland Kitchen atorvastatin (LIPITOR) 40 MG tablet Take 1 tablet (40 mg total) by mouth daily. 90 tablet 1  . Beta Carotene (VITAMIN A) 25000 UNIT capsule Take 25,000 Units by mouth daily.    . bisoprolol (ZEBETA) 10 MG tablet Take 1 tablet (10 mg total) by mouth every evening. 90 tablet 1  . blood glucose meter kit and supplies KIT Use to check blood sugar three times daily. DX: E11.8 1 each 0  . Cholecalciferol 2000 units TABS Take 1 tablet (2,000 Units total) by mouth daily. 90 tablet 1  . cyanocobalamin 2000 MCG tablet Take 1 tablet (2,000 mcg total) by mouth daily. 90 tablet 1  . cycloSPORINE (RESTASIS) 0.05 % ophthalmic emulsion Place  1 drop into both eyes daily.    . Empagliflozin-metFORMIN HCl (SYNJARDY) 01-999 MG TABS Take 1 tablet by mouth 2 (two) times daily. 180 tablet 1  . ferrous sulfate (FEROSUL) 325 (65 FE) MG tablet Take 1 tablet (325 mg total) by mouth 2 (two) times daily with a meal. 180 tablet 1  . fluconazole (DIFLUCAN) 200 MG tablet Take 1 tablet (200 mg total) by mouth once a week. 12 tablet 0  . furosemide (LASIX) 80 MG tablet Take 1.5 tablets (120 mg total) by mouth every morning AND 1 tablet (80 mg total) at bedtime. 180 tablet 3  . glucose blood (COOL BLOOD GLUCOSE TEST  STRIPS) test strip Use to check blood sugar three times daily. DX: E11.8 100 each 12  . HYDROcodone-acetaminophen (NORCO) 10-325 MG tablet Take 1 tablet by mouth every 6 (six) hours as needed. 60 tablet 0  . Insulin Detemir (LEVEMIR FLEXTOUCH) 100 UNIT/ML Pen Inject 240 Units into the skin daily.    . Insulin Pen Needle (ULTICARE SHORT PEN NEEDLES) 31G X 8 MM MISC USE WITH INSULIN PENS DAILY. 100 each 3  . levothyroxine (SYNTHROID) 50 MCG tablet TAKE 1 TABLET BY MOUTH DAILY BEFORE BREAKFAST. 90 tablet 0  . losartan (COZAAR) 25 MG tablet Take 2 tablets (50 mg total) by mouth daily. NEED FOLLOW UP APPT WITH DR [facility] [phone] 180 tablet 1  . lubiprostone (AMITIZA) 24 MCG capsule Take 1 capsule (24 mcg total) by mouth 2 (two) times daily with a meal. 112 capsule 0  . Multiple Vitamins-Minerals (MULTIVITAMIN ADULT PO) Take by mouth.    Marland Kitchen omeprazole (PRILOSEC) 40 MG capsule Take 1 tablet by mouth 30 min before breakfast. 90 capsule 3  . phentermine 37.5 MG capsule TAKE 1 CAPSULE BY MOUTH EVERY MORNING. 30 capsule 0  . potassium chloride SA (KLOR-CON) 20 MEQ tablet Take 1 tablet (20 mEq total) by mouth 2 (two) times daily. 180 tablet 3  . sildenafil (REVATIO) 20 MG tablet Take 4 tablets (80 mg total) by mouth daily as needed. 60 tablet 5  . thiamine (VITAMIN B-1) 100 MG tablet TAKE 1 TABLET BY MOUTH DAILY. 90 tablet 1  . VENTOLIN HFA 108 (90 Base) MCG/ACT inhaler INHALE 2 PUFFS INTO THE LUNGS EVERY 6 HOURS AS NEEDED FOR WHEEZING OR SHORTNESS OF BREATH. 18 g 2  . vitamin B-6 (PYRIDOXINE) 25 MG tablet Take 1 tablet (25 mg total) by mouth daily. 90 tablet 1   No current facility-administered medications for this encounter.    Allergies:   Shellfish allergy and Lisinopril   Social History:  The patient  reports that he has never smoked. He has never used smokeless tobacco. He reports previous alcohol use. He reports that he does not use drugs.   Family History:  The patient's family history includes  CAD in his mother; Congestive Heart Failure in his mother; Diabetes in his father and mother; Heart attack (age of onset: 73) in his brother; Heart failure in his brother; Hepatitis in his mother.   ROS:  Please see the history of present illness.   All other systems are personally reviewed and negative.   Exam:   BP (!) 124/59   Pulse 94   Wt (!) 199 kg (438 lb 12.8 oz)   SpO2 (!) 80%   BMI 59.51 kg/m  General: NAD Neck: No JVD, no thyromegaly or thyroid nodule.  Lungs: Clear to auscultation bilaterally with normal respiratory effort. CV: Nondisplaced PMI.  Heart regular S1/S2, no S3/S4, no  murmur.  Left lower leg chronic edema.  No carotid bruit.  Normal pedal pulses.  Abdomen: Soft, nontender, no hepatosplenomegaly, no distention.  Skin: Intact without lesions or rashes.  Neurologic: Alert and oriented x 3.  Psych: Normal affect. Extremities: No clubbing or cyanosis.  HEENT: Normal.   Recent Labs: 04/09/2019: Hemoglobin 10.7; Platelets 238.0; TSH 2.88 09/23/2019: BUN 30; Creatinine, Ser 2.41; Potassium 4.0; Sodium 137  Personally reviewed   Wt Readings from Last 3 Encounters:  09/23/19 (!) 199 kg (438 lb 12.8 oz)  08/06/19 (!) 199.6 kg (440 lb)  05/21/19 (!) 204.6 kg (451 lb)      ASSESSMENT AND PLAN:  1. Chronic diastolic CHF:  Weight is down.  Chronic NYHA class III symptoms.  He does not seem particularly volume overloaded.  - Continue Lasix 120 mg qam/80 mg qpm.  - Continue KCl 20 bid.  - BMET today.   2. Obesity: Needs significant further weight loss but is down 27 lbs compared to last appointment.  He has been evaluated for transitioning from lap band to gastric sleeve.  Plans to go to [facility].  3. HTN: BP controlled.  4. OSA: Now using CPAP.  5. CKD: Stage 3.  If creatinine is higher today, would be reasonable to refer to nephrology.  6. Asymmetric LE edema: Left leg is considerably larger.  He is at risk for DVT as he is very sedentary.   - I will arrange for  left leg venous dopplers.   Followup in 6 months.   Signed, [name], MD  09/23/2019   [place] [address] [facility] and [place] [address] [phone] (office) [phone] (fax)

## 2019-09-23 NOTE — Patient Instructions (Signed)
No medication changes today  Labs today We will only contact you if something comes back abnormal or we need to make some changes. Otherwise no news is good news!  You are scheduled for an ultrasound of your left leg. You will get a call with the results.   Your physician recommends that you schedule a follow-up appointment in: 6 months with Dr Aundra Dubin. You will get a call to schedule this appointment. Please call us if you do not get a call by MAY  Please call office at [phone] option 2 if you have any questions or concerns. \  At the [facility], you and your health needs are our priority. As part of our continuing mission to provide you with exceptional heart care, we have created designated Provider Care Teams. These Care Teams include your primary Cardiologist (physician) and Advanced Practice Providers (APPs- Physician Assistants and Nurse Practitioners) who all work together to provide you with the care you need, when you need it.   You may see any of the following providers on your designated Care Team at your next follow up: Marland Kitchen Dr Glori Bickers . Dr [name] . Darrick Grinder, NP . Lyda Jester, PA . Audry Riles, PharmD   Please be sure to bring in all your medications bottles to every appointment.

## 2019-09-24 ENCOUNTER — Ambulatory Visit (HOSPITAL_COMMUNITY): Payer: Medicare Other | Attending: Cardiology

## 2019-09-25 NOTE — Telephone Encounter (Signed)
Tried to fax back several times without success.   Faxed again and fax confirmation stated success.   lvm with [name] informing the trouble that I was having but that I did receive a confirmation from last nights fax that it was successful.   Closing note.

## 2019-09-25 NOTE — Telephone Encounter (Signed)
Copied from [place] [phone]. Topic: General - Other >> Sep 17, 2019 10:21 AM Yvette Rack wrote: Reason for CRM: Kieth Brightly with [employer] stated that they faxed a prescription for equipment repair earlier in the month but has not received anything back. Kieth Brightly stated she will refax the request. Cb# [phone] Ext. [phone] Fax# [phone] >> Sep 17, 2019 12:46 PM Cairrikier Dian Queen, CMA wrote: Joslyn Hy and they have resent the fax. Fax has been received.

## 2019-09-27 ENCOUNTER — Ambulatory Visit (INDEPENDENT_AMBULATORY_CARE_PROVIDER_SITE_OTHER): Payer: Medicare Other | Admitting: Podiatry

## 2019-09-27 ENCOUNTER — Other Ambulatory Visit: Payer: Self-pay

## 2019-09-27 ENCOUNTER — Encounter: Payer: Self-pay | Admitting: Podiatry

## 2019-09-27 DIAGNOSIS — M79675 Pain in left toe(s): Secondary | ICD-10-CM

## 2019-09-27 DIAGNOSIS — M79674 Pain in right toe(s): Secondary | ICD-10-CM

## 2019-09-27 DIAGNOSIS — E1142 Type 2 diabetes mellitus with diabetic polyneuropathy: Secondary | ICD-10-CM | POA: Diagnosis not present

## 2019-09-27 DIAGNOSIS — B351 Tinea unguium: Secondary | ICD-10-CM

## 2019-09-27 DIAGNOSIS — L84 Corns and callosities: Secondary | ICD-10-CM | POA: Diagnosis not present

## 2019-09-27 NOTE — Progress Notes (Signed)
Subjective: [name] presents with h/o diabetes and diabetic neuropathy. He presents for preventative diabetic and cc of painful, discolored, thick toenails and painful calluses and corn which interfere with activities of daily living. Pain is aggravated when wearing enclosed shoe gear. Pain is relieved with periodic professional debridement.  [name], MD is his PCP. Last visit 08/29/2019.  Medications reviewed in chart.  Allergies  Allergen Reactions  . Shellfish Allergy Anaphylaxis  . Lisinopril Cough   Objective: There were no vitals filed for this visit.  Vascular Examination: Capillary refill time immediate b/l.  Dorsalis pedis pulses palpable b/l.  Posterior tibial pulses palpable b/l.  Digital hair absent b/l.  Skin temperature gradient WNL b/l.  Dermatological Examination: Skin with normal turgor, texture and tone b/l.  Toenails 1-5 b/l discolored, thick, dystrophic with subungual debris and pain with palpation to nailbeds due to thickness of nails.  Hyperkeratotic lesion(s) sub hallux left foot, submet head 1 left foot and left 5th PIPJ. No erythema, no edema, no drainage, no flocculence noted.   Healing abrasion right 2nd digit with scab. No erythema, no edema, no drainage, no flocculence  Musculoskeletal: Muscle strength 5/5 to all LE muscle groups.  No pain, crepitus or joint limitation with passive/active ROM.  Neurological: Sensation diminished with 10 gram monofilament bilaterally.  Vibratory sensation diminished bilaterally.  Assessment: 1. Painful onychomycosis toenails 1-5 b/l 2. Calluses 1 left foot, left hallux   3. Corn left 5th digit 4. NIDDM with neuropathy   Plan: 1. Continue diabetic foot care principles. Literature dispensed on today. 2. Toenails 1-5 b/l were debrided in length and girth without iatrogenic bleeding. 3. Calluses pared submetatarsal head(s) 1 left foot and left hallux utilizing sterile scalpel blade without  incident.  4. Corn(s) left 5th digit pared utilizing sterile scalpel blade without incident.  5. Patient to continue soft, supportive shoe gear. 6. Patient to report any pedal injuries to medical professional. 7. Follow up 3 months.  8. Patient/POA to call should there be a concern in the interim.

## 2019-09-27 NOTE — Patient Instructions (Signed)
Diabetes Mellitus and Foot Care Foot care is an important part of your health, especially when you have diabetes. Diabetes may cause you to have problems because of poor blood flow (circulation) to your feet and legs, which can cause your skin to:  Become thinner and drier.  Break more easily.  Heal more slowly.  Peel and crack. You may also have nerve damage (neuropathy) in your legs and feet, causing decreased feeling in them. This means that you may not notice minor injuries to your feet that could lead to more serious problems. Noticing and addressing any potential problems early is the best way to prevent future foot problems. How to care for your feet Foot hygiene  Wash your feet daily with warm water and mild soap. Do not use hot water. Then, pat your feet and the areas between your toes until they are completely dry. Do not soak your feet as this can dry your skin.  Trim your toenails straight across. Do not dig under them or around the cuticle. File the edges of your nails with an emery board or nail file.  Apply a moisturizing lotion or petroleum jelly to the skin on your feet and to dry, brittle toenails. Use lotion that does not contain alcohol and is unscented. Do not apply lotion between your toes. Shoes and socks  Wear clean socks or stockings every day. Make sure they are not too tight. Do not wear knee-high stockings since they may decrease blood flow to your legs.  Wear shoes that fit properly and have enough cushioning. Always look in your shoes before you put them on to be sure there are no objects inside.  To break in new shoes, wear them for just a few hours a day. This prevents injuries on your feet. Wounds, scrapes, corns, and calluses  Check your feet daily for blisters, cuts, bruises, sores, and redness. If you cannot see the bottom of your feet, use a mirror or ask someone for help.  Do not cut corns or calluses or try to remove them with medicine.  If you  find a minor scrape, cut, or break in the skin on your feet, keep it and the skin around it clean and dry. You may clean these areas with mild soap and water. Do not clean the area with peroxide, alcohol, or iodine.  If you have a wound, scrape, corn, or callus on your foot, look at it several times a day to make sure it is healing and not infected. Check for: ? Redness, swelling, or pain. ? Fluid or blood. ? Warmth. ? Pus or a bad smell. General instructions  Do not cross your legs. This may decrease blood flow to your feet.  Do not use heating pads or hot water bottles on your feet. They may burn your skin. If you have lost feeling in your feet or legs, you may not know this is happening until it is too late.  Protect your feet from hot and cold by wearing shoes, such as at the beach or on hot pavement.  Schedule a complete foot exam at least once a year (annually) or more often if you have foot problems. If you have foot problems, report any cuts, sores, or bruises to your health care provider immediately. Contact a health care provider if:  You have a medical condition that increases your risk of infection and you have any cuts, sores, or bruises on your feet.  You have an injury that is not   healing.  You have redness on your legs or feet.  You feel burning or tingling in your legs or feet.  You have pain or cramps in your legs and feet.  Your legs or feet are numb.  Your feet always feel cold.  You have pain around a toenail. Get help right away if:  You have a wound, scrape, corn, or callus on your foot and: ? You have pain, swelling, or redness that gets worse. ? You have fluid or blood coming from the wound, scrape, corn, or callus. ? Your wound, scrape, corn, or callus feels warm to the touch. ? You have pus or a bad smell coming from the wound, scrape, corn, or callus. ? You have a fever. ? You have a red line going up your leg. Summary  Check your feet every day  for cuts, sores, red spots, swelling, and blisters.  Moisturize feet and legs daily.  Wear shoes that fit properly and have enough cushioning.  If you have foot problems, report any cuts, sores, or bruises to your health care provider immediately.  Schedule a complete foot exam at least once a year (annually) or more often if you have foot problems. This information is not intended to replace advice given to you by your health care provider. Make sure you discuss any questions you have with your health care provider. Document Revised: 05/29/2019 Document Reviewed: 10/07/2016 Elsevier Patient Education  2020 Elsevier Inc.  

## 2019-09-30 ENCOUNTER — Telehealth: Payer: Self-pay

## 2019-10-01 NOTE — Telephone Encounter (Addendum)
  Follow up  Patient returned call, states he takes 240 unites of Levemir Flex touch pen. Patient also provided insurance information ( has not received new card) [facility] # [id number] (verified eligible)  GRP [id number] Provider # [phone]

## 2019-10-01 NOTE — Telephone Encounter (Signed)
LVM for pt to call back.   RE: I need to confirm insurance information and also the amount of insulin he uses per day.

## 2019-10-02 ENCOUNTER — Other Ambulatory Visit: Payer: Self-pay | Admitting: Internal Medicine

## 2019-10-02 DIAGNOSIS — E1142 Type 2 diabetes mellitus with diabetic polyneuropathy: Secondary | ICD-10-CM

## 2019-10-02 DIAGNOSIS — M159 Polyosteoarthritis, unspecified: Secondary | ICD-10-CM

## 2019-10-02 DIAGNOSIS — M5416 Radiculopathy, lumbar region: Secondary | ICD-10-CM

## 2019-10-03 NOTE — Telephone Encounter (Signed)
Key: [id number]

## 2019-10-04 ENCOUNTER — Other Ambulatory Visit: Payer: Self-pay

## 2019-10-04 NOTE — Patient Outreach (Signed)
[facility] [facility]) Care Management  10/04/2019  [name] 28-Nov-1955 [id number]   Telephone Assessment   Outreach attempt #1 to patient. Spoke with patient. He continues to report that he is doing about the same and no changes in his condition. He continues to voice some frustration that he is not a surgical candidate for hernia repair given his weight. He reports that he can still see the "bulge" in his stomach. He continues to have off an on issues with constipation. patient not consistent with adhering to regimen and following instructions as advised by MD. RN CM encouraged patient to do so as well as discussed diet education. He reports that cbgs remain controlled and ranging in the 80's-100's. He saw cardiology on 09/23/2019 and podiatrist on 09/27/2019. Patient sate ts that he is in need of bariatric shower chair but unable to afford one. The one he has currently only supports weight up to 350 lbs.He is fearful that it will break on him. RN CM discussed possible referral to [place] for possible resources/assistance and patient agreeable.Patient reports that he has switched from [facility] to [facility] Medicare. Discussed with patient transferring to another RN for continued disease mgmt/education support and he is agreeable.  He denies any further RN CM needs or concerns at this time.  THN CM Care Plan Problem One     Most Recent Value  Care Plan Problem One  Knowledge deficit related to Diabetes and A1C managment.   Role Documenting the Problem One  Care Management Telephonic Coordinator  Care Plan for Problem One  Active  [facility] Long Term Goal   The patient will report maintaining A1C level of 7 or less over the next 90 days.  THN Long Term Goal Start Date  09/06/19  Interventions for Problem One Long Term Goal  RNCM continued diabetic education and reinforcement.    THN CM Care Plan Problem Two     Most Recent Value  Care Plan Problem Two  Patient with hernia and constipation  issues.  Role Documenting the Problem Two  Care Management Telephonic [place] for Problem Two  Active  Interventions for Problem Two Long Term Goal   RNCM assessed for update on surgery and reviewed with pt. ways to manage hernia.  THN Long Term Goal  Patient will report being approved for hernia surgery over the next 90 days.  THN Long Term Goal Start Date  09/06/19  [facility] CM Short Term Goal #1   Patient will report contacting surgeon office for second surgical opinion/eval.  THN CM Short Term Goal #1 Start Date  09/06/19  Interventions for Short Term Goal #2   RNCM assessed to see if pt. had followed up with office.  THN CM Short Term Goal #2   Patient will report having BM at least every 3-4 days over the next 30 days.  THN CM Short Term Goal #2 Start Date  09/06/19  [facility] CM Short Term Goal #2 Met Date  10/04/19      Plan: RN CM will send [facility] SW referral.  RN CM will request patient to reassigned to another RN CM due to change in insurance.   Enzo Montgomery, RN,BSN,CCM [place] [facility] Telephonic Care Management Coordinator Direct Phone: [phone] Toll Free: [phone] Fax: [phone]

## 2019-10-04 NOTE — Telephone Encounter (Signed)
PA came back as a covered medication.  Called pharmacy to confirm that insurance did not require a PA. They confirmed that insurance did pay the claim for the medication.   Pt contacted and informed of same.

## 2019-10-08 ENCOUNTER — Other Ambulatory Visit: Payer: Self-pay

## 2019-10-08 ENCOUNTER — Ambulatory Visit: Payer: Medicare HMO

## 2019-10-08 NOTE — Patient Outreach (Signed)
[place]) Care Management  10/08/2019  [name] 1956/01/04 [id number]   Social work referral received on 10/04/19 from [facility], [name], regarding patient financial hardship and needing to obtain bariatric shower chair. Unsuccessful outreach to patient today.  Left voicemail message on home number.  Unable to leave message on mobile number as mailbox was full.  Mailed unsuccessful outreach letter.  Will attempt to reach again within four business days.  Ronn Melena, BSW Social Worker [phone]

## 2019-10-10 ENCOUNTER — Other Ambulatory Visit: Payer: Self-pay

## 2019-10-10 NOTE — Patient Outreach (Signed)
[place]) Care Management  10/10/2019  [name] 1956/05/14 [id number]   Social work referral received on 10/04/19 from [facility], Enzo Montgomery, regarding patient financial hardship and needing to obtain bariatric shower chair.  Successful outreach to patient today.  Patient reports that he prioritizes certain bills like rent, electricity, etc. But some bills do not get paid every month.  Patient makes minimum payment on medical bills, DME payments (lift chair & medical bed), and credit card.  Patient stated that he pays what he can for cable/phone each month as it "rolls over each month" if entire bill is not paid.  Patient was able to obtain shower chair from [place] but weight limit is 350lbs and patient weighs almost 500lbs.  Per patient, Vladimir Faster has chair available online for approximately $35 but he cannot afford cost.  Discussed various financial resources but there are not community agencies that assist with cost of DME. Patient is over income limit to qualify for [facility].   He does have MQB but this does not assist with cost of DME.    Completed Financial Assessment form and submitted to [place] requesting assistance with cost of shower chair. Will follow up with patient when response is received.   Ronn Melena, BSW Social Worker [phone]

## 2019-10-11 DIAGNOSIS — G4733 Obstructive sleep apnea (adult) (pediatric): Secondary | ICD-10-CM | POA: Diagnosis not present

## 2019-10-15 ENCOUNTER — Other Ambulatory Visit: Payer: Self-pay

## 2019-10-15 NOTE — Patient Outreach (Signed)
[place] [facility]) Care Management  10/15/2019  [name] 19-May-1956 [id number]   Successful follow up call to patient today.  Informed him that [place] approved request for assistance with purchase of shower chair.  Shower chair ordered online and should be delivered to patient on 10/25/19. Will follow up with patient on 10/28/19 to ensure receipt.  Ronn Melena, BSW Social Worker [phone]

## 2019-10-28 ENCOUNTER — Other Ambulatory Visit: Payer: Self-pay

## 2019-10-28 NOTE — Patient Outreach (Signed)
[place] [facility]) Care Management  10/28/2019  [name] 28-May-1956 [id number]   Follow up call to patient today.  He received shower chair that was provided by [place].  Patient was appreciative of assistance.  Social Work case being closed.   Ronn Melena, BSW Social Worker [phone]

## 2019-10-31 ENCOUNTER — Encounter: Payer: Self-pay | Admitting: Internal Medicine

## 2019-10-31 ENCOUNTER — Ambulatory Visit (INDEPENDENT_AMBULATORY_CARE_PROVIDER_SITE_OTHER): Payer: Medicare Other | Admitting: Internal Medicine

## 2019-10-31 ENCOUNTER — Other Ambulatory Visit: Payer: Self-pay

## 2019-10-31 ENCOUNTER — Ambulatory Visit: Payer: Medicare Other

## 2019-10-31 VITALS — BP 120/60 | HR 74 | Temp 98.1°F | Resp 16 | Ht 72.0 in | Wt >= 6400 oz

## 2019-10-31 DIAGNOSIS — E039 Hypothyroidism, unspecified: Secondary | ICD-10-CM | POA: Diagnosis not present

## 2019-10-31 DIAGNOSIS — K5903 Drug induced constipation: Secondary | ICD-10-CM

## 2019-10-31 DIAGNOSIS — E519 Thiamine deficiency, unspecified: Secondary | ICD-10-CM

## 2019-10-31 DIAGNOSIS — S0992XA Unspecified injury of nose, initial encounter: Secondary | ICD-10-CM

## 2019-10-31 DIAGNOSIS — E538 Deficiency of other specified B group vitamins: Secondary | ICD-10-CM | POA: Diagnosis not present

## 2019-10-31 DIAGNOSIS — T402X5A Adverse effect of other opioids, initial encounter: Secondary | ICD-10-CM

## 2019-10-31 DIAGNOSIS — E559 Vitamin D deficiency, unspecified: Secondary | ICD-10-CM

## 2019-10-31 DIAGNOSIS — I10 Essential (primary) hypertension: Secondary | ICD-10-CM

## 2019-10-31 DIAGNOSIS — D508 Other iron deficiency anemias: Secondary | ICD-10-CM | POA: Diagnosis not present

## 2019-10-31 DIAGNOSIS — R10813 Right lower quadrant abdominal tenderness: Secondary | ICD-10-CM | POA: Insufficient documentation

## 2019-10-31 DIAGNOSIS — C189 Malignant neoplasm of colon, unspecified: Secondary | ICD-10-CM

## 2019-10-31 DIAGNOSIS — E118 Type 2 diabetes mellitus with unspecified complications: Secondary | ICD-10-CM | POA: Diagnosis not present

## 2019-10-31 LAB — HEPATIC FUNCTION PANEL
ALT: 23 U/L (ref 0–53)
AST: 21 U/L (ref 0–37)
Albumin: 3.4 g/dL — ABNORMAL LOW (ref 3.5–5.2)
Alkaline Phosphatase: 118 U/L — ABNORMAL HIGH (ref 39–117)
Bilirubin, Direct: 0 mg/dL (ref 0.0–0.3)
Total Bilirubin: 0.3 mg/dL (ref 0.2–1.2)
Total Protein: 7.9 g/dL (ref 6.0–8.3)

## 2019-10-31 LAB — BASIC METABOLIC PANEL
BUN: 27 mg/dL — ABNORMAL HIGH (ref 6–23)
CO2: 32 mEq/L (ref 19–32)
Calcium: 9.6 mg/dL (ref 8.4–10.5)
Chloride: 102 mEq/L (ref 96–112)
Creatinine, Ser: 1.81 mg/dL — ABNORMAL HIGH (ref 0.40–1.50)
GFR: 46.2 mL/min — ABNORMAL LOW (ref >60.00)
Glucose, Bld: 117 mg/dL — ABNORMAL HIGH (ref 70–99)
Potassium: 4.4 mEq/L (ref 3.5–5.1)
Sodium: 140 mEq/L (ref 135–145)

## 2019-10-31 LAB — URINALYSIS, ROUTINE W REFLEX MICROSCOPIC
Bilirubin Urine: NEGATIVE
Hgb urine dipstick: NEGATIVE
Ketones, ur: NEGATIVE
Leukocytes,Ua: NEGATIVE
Nitrite: NEGATIVE
RBC / HPF: NONE SEEN (ref 0–?)
Specific Gravity, Urine: 1.015 (ref 1.000–1.030)
Total Protein, Urine: NEGATIVE
Urine Glucose: 250 — AB
Urobilinogen, UA: 0.2 (ref 0.0–1.0)
pH: 5.5 (ref 5.0–8.0)

## 2019-10-31 LAB — CBC WITH DIFFERENTIAL/PLATELET
Basophils Absolute: 0 10*3/uL (ref 0.0–0.1)
Basophils Relative: 0.4 % (ref 0.0–3.0)
Eosinophils Absolute: 0.2 10*3/uL (ref 0.0–0.7)
Eosinophils Relative: 2.5 % (ref 0.0–5.0)
HCT: 32 % — ABNORMAL LOW (ref 39.0–52.0)
Hemoglobin: 10 g/dL — ABNORMAL LOW (ref 13.0–17.0)
Lymphocytes Relative: 25.3 % (ref 12.0–46.0)
Lymphs Abs: 2 10*3/uL (ref 0.7–4.0)
MCHC: 31.1 g/dL (ref 30.0–36.0)
MCV: 89 fl (ref 78.0–100.0)
Monocytes Absolute: 0.4 10*3/uL (ref 0.1–1.0)
Monocytes Relative: 5.2 % (ref 3.0–12.0)
Neutro Abs: 5.3 10*3/uL (ref 1.4–7.7)
Neutrophils Relative %: 66.6 % (ref 43.0–77.0)
Platelets: 310 10*3/uL (ref 150.0–400.0)
RBC: 3.6 Mil/uL — ABNORMAL LOW (ref 4.22–5.81)
RDW: 15.7 % — ABNORMAL HIGH (ref 11.5–15.5)
WBC: 7.9 10*3/uL (ref 4.0–10.5)

## 2019-10-31 LAB — MICROALBUMIN / CREATININE URINE RATIO
Creatinine,U: 132.6 mg/dL
Microalb Creat Ratio: 0.7 mg/g (ref 0.0–30.0)
Microalb, Ur: 1 mg/dL (ref 0.0–1.9)

## 2019-10-31 LAB — TSH: TSH: 1.31 u[IU]/mL (ref 0.35–4.50)

## 2019-10-31 LAB — FERRITIN: Ferritin: 502.9 ng/mL — ABNORMAL HIGH (ref 22.0–322.0)

## 2019-10-31 LAB — VITAMIN B12: Vitamin B-12: 633 pg/mL (ref 211–911)

## 2019-10-31 LAB — IBC PANEL
Iron: 32 ug/dL — ABNORMAL LOW (ref 42–165)
Saturation Ratios: 13.9 % — ABNORMAL LOW (ref 20.0–50.0)
Transferrin: 165 mg/dL — ABNORMAL LOW (ref 212.0–360.0)

## 2019-10-31 LAB — CEA: CEA: <0.5 ng/mL

## 2019-10-31 LAB — FOLATE: Folate: 8 ng/mL (ref >5.9)

## 2019-10-31 LAB — HEMOGLOBIN A1C: Hgb A1c MFr Bld: 8.3 % — ABNORMAL HIGH (ref 4.6–6.5)

## 2019-10-31 MED ORDER — RELISTOR 150 MG PO TABS: 3.0000 | ORAL_TABLET | Freq: Every day | ORAL | 0 refills | Status: DC

## 2019-10-31 NOTE — Progress Notes (Signed)
Subjective:  Patient ID: [name], male    DOB: [birth year]  Age: 64 y.o. MRN: [id number]  CC: Abdominal Pain, Anemia, Osteoarthritis, Hypothyroidism, and Diabetes  This visit occurred during the SARS-CoV-2 public health emergency.  Safety protocols were in place, including screening questions prior to the visit, additional usage of staff PPE, and extensive cleaning of exam room while observing appropriate contact time as indicated for disinfecting solutions.   HPI [name] presents for f/up - He complains that he feel forward a few days ago and hit his nose. He complains of pain and swelling over the bridge of the nose.  He also complains of a 1 week hx intermittent abdominal pain, sharp pain in the RLQ with constipation. He tells me that Amitiza has not helped the constipation so he has been using dulcolax. He has had these symptoms before and believes that the pain is caused by a LapBand port that does not function properly any more but he has not found a surgeon that will remove it.  Outpatient Medications Prior to Visit  Medication Sig Dispense Refill  . Accu-Chek Softclix Lancets lancets CHECK BLOOD SUGAR THREE TIMES DAILY 100 each 2  . albuterol (PROVENTIL HFA;VENTOLIN HFA) 108 (90 Base) MCG/ACT inhaler Inhale 2 puffs into the lungs every 6 (six) hours as needed for wheezing or shortness of breath. 1 Inhaler 1  . aspirin EC 81 MG tablet Take 81 mg by mouth daily.     Marland Kitchen atorvastatin (LIPITOR) 40 MG tablet Take 1 tablet (40 mg total) by mouth daily. 90 tablet 1  . Beta Carotene (VITAMIN A) 25000 UNIT capsule Take 25,000 Units by mouth daily.    . bisoprolol (ZEBETA) 10 MG tablet Take 1 tablet (10 mg total) by mouth every evening. 90 tablet 1  . blood glucose meter kit and supplies KIT Use to check blood sugar three times daily. DX: E11.8 1 each 0  . Cholecalciferol 2000 units TABS Take 1 tablet (2,000 Units total) by mouth daily. 90 tablet 1  . cyanocobalamin 2000 MCG tablet  Take 1 tablet (2,000 mcg total) by mouth daily. 90 tablet 1  . cycloSPORINE (RESTASIS) 0.05 % ophthalmic emulsion Place 1 drop into both eyes daily.    . Empagliflozin-metFORMIN HCl (SYNJARDY) 01-999 MG TABS Take 1 tablet by mouth 2 (two) times daily. 180 tablet 1  . ferrous sulfate (FEROSUL) 325 (65 FE) MG tablet Take 1 tablet (325 mg total) by mouth 2 (two) times daily with a meal. 180 tablet 1  . furosemide (LASIX) 80 MG tablet Take 1.5 tablets (120 mg total) by mouth every morning AND 1 tablet (80 mg total) at bedtime. 180 tablet 3  . glucose blood (COOL BLOOD GLUCOSE TEST STRIPS) test strip Use to check blood sugar three times daily. DX: E11.8 100 each 12  . HYDROcodone-acetaminophen (NORCO) 10-325 MG tablet TAKE 1 TABLET BY MOUTH EVERY 6 HOURS AS NEEDED. 60 tablet 0  . Insulin Detemir (LEVEMIR FLEXTOUCH) 100 UNIT/ML Pen Inject 240 Units into the skin daily.    . Insulin Pen Needle (ULTICARE SHORT PEN NEEDLES) 31G X 8 MM MISC USE WITH INSULIN PENS DAILY. 100 each 3  . levothyroxine (SYNTHROID) 50 MCG tablet TAKE 1 TABLET BY MOUTH DAILY BEFORE BREAKFAST. 90 tablet 0  . losartan (COZAAR) 25 MG tablet Take 2 tablets (50 mg total) by mouth daily. NEED FOLLOW UP APPT WITH DR [facility] [phone] 180 tablet 1  . Multiple Vitamins-Minerals (MULTIVITAMIN ADULT PO) Take by mouth.    Marland Kitchen  omeprazole (PRILOSEC) 40 MG capsule Take 1 tablet by mouth 30 min before breakfast. 90 capsule 3  . phentermine 37.5 MG capsule TAKE 1 CAPSULE BY MOUTH EVERY MORNING. 30 capsule 0  . potassium chloride SA (KLOR-CON) 20 MEQ tablet Take 1 tablet (20 mEq total) by mouth 2 (two) times daily. 180 tablet 3  . sildenafil (REVATIO) 20 MG tablet Take 4 tablets (80 mg total) by mouth daily as needed. 60 tablet 5  . thiamine (VITAMIN B-1) 100 MG tablet TAKE 1 TABLET BY MOUTH DAILY. 90 tablet 1  . VENTOLIN HFA 108 (90 Base) MCG/ACT inhaler INHALE 2 PUFFS INTO THE LUNGS EVERY 6 HOURS AS NEEDED FOR WHEEZING OR SHORTNESS OF BREATH. 18 g 2   . fluconazole (DIFLUCAN) 200 MG tablet Take 1 tablet (200 mg total) by mouth once a week. 12 tablet 0  . lubiprostone (AMITIZA) 24 MCG capsule Take 1 capsule (24 mcg total) by mouth 2 (two) times daily with a meal. 112 capsule 0  . vitamin B-6 (PYRIDOXINE) 25 MG tablet Take 1 tablet (25 mg total) by mouth daily. 90 tablet 1   No facility-administered medications prior to visit.    ROS Review of Systems  Constitutional: Negative for appetite change, chills, fatigue and fever.  HENT: Positive for facial swelling. Negative for congestion, nosebleeds, rhinorrhea, sinus pressure, sinus pain, trouble swallowing and voice change.   Respiratory: Negative for choking, chest tightness, shortness of breath and wheezing.   Gastrointestinal: Positive for abdominal pain and constipation. Negative for blood in stool, diarrhea, nausea and vomiting.  Endocrine: Negative.  Negative for cold intolerance and heat intolerance.  Genitourinary: Negative.  Negative for difficulty urinating.  Musculoskeletal: Positive for arthralgias. Negative for myalgias.  Skin: Negative.  Negative for color change, pallor and rash.  Allergic/Immunologic: Negative.   Neurological: Negative.  Negative for dizziness, weakness and light-headedness.  Hematological: Negative for adenopathy. Does not bruise/bleed easily.  Psychiatric/Behavioral: Negative.     Objective:  BP 120/60 (BP Location: Left Arm, Patient Position: Sitting, Cuff Size: Large)   Pulse 74   Temp 98.1 F (36.7 C) (Oral)   Resp 16   Ht 6' (1.829 m)   Wt (!) 437 lb (198.2 kg)   SpO2 92%   BMI 59.27 kg/m   BP Readings from Last 3 Encounters:  10/31/19 120/60  09/23/19 (!) 124/59  08/29/19 122/62    Wt Readings from Last 3 Encounters:  10/31/19 (!) 437 lb (198.2 kg)  09/23/19 (!) 438 lb 12.8 oz (199 kg)  08/06/19 (!) 440 lb (199.6 kg)    Physical Exam Vitals reviewed.  Constitutional:      General: He is not in acute distress.    Appearance:  He is obese. He is ill-appearing. He is not toxic-appearing or diaphoretic.     Comments: Morbidly obese, in a scooter  HENT:     Right Ear: No hemotympanum.     Left Ear: No hemotympanum.     Nose: Signs of injury and nasal tenderness present. No nasal deformity, septal deviation, laceration, mucosal edema, congestion or rhinorrhea.     Right Nostril: No foreign body, epistaxis, septal hematoma or occlusion.     Left Nostril: No foreign body, epistaxis, septal hematoma or occlusion.     Right Sinus: No maxillary sinus tenderness or frontal sinus tenderness.     Left Sinus: No maxillary sinus tenderness or frontal sinus tenderness.     Comments: Swelling and ttp over the bridge of the nose  Mouth/Throat:     Mouth: Mucous membranes are moist.  Eyes:     General: No scleral icterus.    Conjunctiva/sclera: Conjunctivae normal.  Cardiovascular:     Rate and Rhythm: Normal rate and regular rhythm.     Heart sounds: No murmur.  Pulmonary:     Effort: Pulmonary effort is normal.     Breath sounds: No stridor. No wheezing, rhonchi or rales.  Abdominal:     General: Abdomen is protuberant. Bowel sounds are normal. There is no distension.     Palpations: Abdomen is soft. There is no hepatomegaly, splenomegaly or mass.     Tenderness: There is abdominal tenderness in the right lower quadrant. There is no guarding or rebound.     Hernia: No hernia is present.  Musculoskeletal:        General: Normal range of motion.     Cervical back: Neck supple.     Right lower leg: Edema (trace) present.     Left lower leg: Edema (trace) present.  Lymphadenopathy:     Cervical: No cervical adenopathy.  Skin:    General: Skin is warm and dry.     Coloration: Skin is not jaundiced.  Neurological:     General: No focal deficit present.     Mental Status: He is alert and oriented to person, place, and time. Mental status is at baseline.  Psychiatric:        Mood and Affect: Mood normal.         Behavior: Behavior normal.     Lab Results  Component Value Date   WBC 7.9 10/31/2019   HGB 10.0 (L) 10/31/2019   HCT 32.0 (L) 10/31/2019   PLT 310.0 10/31/2019   GLUCOSE 117 (H) 10/31/2019   CHOL 125 03/21/2019   TRIG 122 03/21/2019   HDL 26 (L) 03/21/2019   LDLDIRECT 136.8 12/12/2006   LDLCALC 75 03/21/2019   ALT 23 10/31/2019   AST 21 10/31/2019   NA 140 10/31/2019   K 4.4 10/31/2019   CL 102 10/31/2019   CREATININE 1.81 (H) 10/31/2019   BUN 27 (H) 10/31/2019   CO2 32 10/31/2019   TSH 1.31 10/31/2019   PSA 1.43 11/22/2018   HGBA1C 8.3 (H) 10/31/2019   MICROALBUR 1.0 10/31/2019    No results found.  Assessment & Plan:   Willmar was seen today for abdominal pain, anemia, osteoarthritis, hypothyroidism and diabetes.  Diagnoses and all orders for this visit:  Essential hypertension- His BP is well controlled. -     Vitamin B12 -     Basic metabolic panel -     Urinalysis, Routine w reflex microscopic -     Hepatic function panel  Type II diabetes mellitus with manifestations ([place])- He has achieved adequate glycemic control considering his co- morbid illnesses. -     Vitamin B12 -     Urinalysis, Routine w reflex microscopic -     Hemoglobin A1c -     Microalbumin / creatinine urine ratio  Malignant neoplasm of colon, unspecified part of colon ([place])- His CEA remains undetectable. This is a reassuring sign that there is no recurrence of colon cancer. -     Hepatic function panel -     CEA; Future -     CEA  Acquired hypothyroidism- His TSH is in the normal range. Will continue the current T4 dose. -     TSH  Other iron deficiency anemia- I have recommended that he receive a series of  iron infusions. -     CBC with Differential/Platelet -     IBC panel -     Ferritin  Thiamine deficiency- Will monitor his B1 level. -     CBC with Differential/Platelet -     Vitamin B1  B12 deficiency- Will monitor his B12 and folate levels. -     CBC with  Differential/Platelet -     Vitamin B12 -     Folate  Vitamin D deficiency  Nasal trauma, initial encounter- His exam is reassuring. Will check plain films to see if there is a fracture. -     DG Nasal Bones; Future  Right lower quadrant abdominal tenderness without rebound tenderness- His abd exam and labs are reassuring that he does not have an acute abd process. Will assess the Lapband port with plain films. He will continue to discuss this with GS. -     DG ABD ACUTE 2+V W 1V CHEST; Future  Therapeutic opioid-induced constipation (OIC)- Will treat him for OIC. -     Methylnaltrexone Bromide (RELISTOR) 150 MG TABS; Take 3 tablets by mouth daily.   I have discontinued Hawley Drone's vitamin B-6, fluconazole, and lubiprostone. I am also having him start on Relistor. Additionally, I am having him maintain his omeprazole, albuterol, thiamine, Multiple Vitamins-Minerals (MULTIVITAMIN ADULT PO), aspirin EC, cyanocobalamin, Cholecalciferol, Ventolin HFA, ferrous sulfate, Cool Blood Glucose Test Strips, blood glucose meter kit and supplies, sildenafil, UltiCare Short Pen Needles, Synjardy, losartan, bisoprolol, atorvastatin, levothyroxine, potassium chloride SA, furosemide, Accu-Chek Softclix Lancets, vitamin A, Levemir FlexTouch, cycloSPORINE, HYDROcodone-acetaminophen, and phentermine.  Meds ordered this encounter  Medications  . Methylnaltrexone Bromide (RELISTOR) 150 MG TABS    Sig: Take 3 tablets by mouth daily.    Dispense:  66 tablet    Refill:  0     Follow-up: Return in about 4 weeks (around 11/28/2019).  Scarlette Calico, MD

## 2019-10-31 NOTE — Patient Instructions (Signed)
Abdominal Pain, Adult Pain in the abdomen (abdominal pain) can be caused by many things. Often, abdominal pain is not serious and it gets better with no treatment or by being treated at home. However, sometimes abdominal pain is serious. Your health care provider will ask questions about your medical history and do a physical exam to try to determine the cause of your abdominal pain. Follow these instructions at home:  Medicines  Take over-the-counter and prescription medicines only as told by your health care provider.  Do not take a laxative unless told by your health care provider. General instructions  Watch your condition for any changes.  Drink enough fluid to keep your urine pale yellow.  Keep all follow-up visits as told by your health care provider. This is important. Contact a health care provider if:  Your abdominal pain changes or gets worse.  You are not hungry or you lose weight without trying.  You are constipated or have diarrhea for more than 2-3 days.  You have pain when you urinate or have a bowel movement.  Your abdominal pain wakes you up at night.  Your pain gets worse with meals, after eating, or with certain foods.  You are vomiting and cannot keep anything down.  You have a fever.  You have blood in your urine. Get help right away if:  Your pain does not go away as soon as your health care provider told you to expect.  You cannot stop vomiting.  Your pain is only in areas of the abdomen, such as the right side or the left lower portion of the abdomen. Pain on the right side could be caused by appendicitis.  You have bloody or black stools, or stools that look like tar.  You have severe pain, cramping, or bloating in your abdomen.  You have signs of dehydration, such as: ? Dark urine, very little urine, or no urine. ? Cracked lips. ? Dry mouth. ? Sunken eyes. ? Sleepiness. ? Weakness.  You have trouble breathing or chest  pain. Summary  Often, abdominal pain is not serious and it gets better with no treatment or by being treated at home. However, sometimes abdominal pain is serious.  Watch your condition for any changes.  Take over-the-counter and prescription medicines only as told by your health care provider.  Contact a health care provider if your abdominal pain changes or gets worse.  Get help right away if you have severe pain, cramping, or bloating in your abdomen. This information is not intended to replace advice given to you by your health care provider. Make sure you discuss any questions you have with your health care provider. Document Revised: 01/14/2019 Document Reviewed: 01/14/2019 Elsevier Patient Education  2020 [employer] Inc.  

## 2019-11-01 ENCOUNTER — Encounter: Payer: Self-pay | Admitting: Internal Medicine

## 2019-11-04 LAB — VITAMIN B1: Vitamin B1 (Thiamine): 26 nmol/L (ref 8–30)

## 2019-11-05 ENCOUNTER — Ambulatory Visit (INDEPENDENT_AMBULATORY_CARE_PROVIDER_SITE_OTHER): Payer: Medicare Other

## 2019-11-05 DIAGNOSIS — R109 Unspecified abdominal pain: Secondary | ICD-10-CM | POA: Diagnosis not present

## 2019-11-05 DIAGNOSIS — S0992XA Unspecified injury of nose, initial encounter: Secondary | ICD-10-CM | POA: Diagnosis not present

## 2019-11-05 DIAGNOSIS — J3489 Other specified disorders of nose and nasal sinuses: Secondary | ICD-10-CM | POA: Diagnosis not present

## 2019-11-05 DIAGNOSIS — R10813 Right lower quadrant abdominal tenderness: Secondary | ICD-10-CM | POA: Diagnosis not present

## 2019-11-07 ENCOUNTER — Other Ambulatory Visit: Payer: Self-pay | Admitting: *Deleted

## 2019-11-07 ENCOUNTER — Encounter: Payer: Self-pay | Admitting: *Deleted

## 2019-11-07 NOTE — Patient Outreach (Signed)
[facility]) Care Management  11/07/2019  [name] [birth year] [id number]   RN Health Coach Monthly Outreach  Referral Date:  11/27/2018 Referral Source:  MD Referral Screening Reason for Referral:  Disease Management Education  Insurance:  [employer]   Outreach Attempt:  Successful telephone outreach to patient for introduction and follow up.  HIPAA verified with patient.  Patient reporting he had recent fall related to pain medication and periodic pain related to abdominal hernia.  Continues to be upset related to not able to have hernia surgery at this time. Patient wanting a second opinion.  Encouraged patient to discuss second opinion with primary care provider and to contact insurance company to obtain list of in network surgeons.  Does report episodes of constipation, but reports last bowel movement was yesterday.  Discussed increase in A1C to 8.3 on 10/31/2019.  Patient admits to drinking cool aid and that on some days he has not taken his insulin.  Discussed and encouraged medication compliance.  Discussed importance of avoiding sugary drinks.  Reports fasting blood sugar this morning was 43 and yesterday was 97.  Discussed importance of treating and preventing hypoglycemic events.  Patient not weighing daily as he states his scale is broken. Encouraged patient to obtain new scale using insurance over the counter benefits.  Appointments:  Attended appointment with Dr. [name], primary care on 10/31/2019.  Attended appointment with Cardiologist, Dr. Aundra Dubin on 09/23/2019.  Plan: RN Health Coach will make next telephone outreach to patient within the month of March and patient agrees to future outreach.  [place] [phone] [email or web address]

## 2019-11-09 ENCOUNTER — Other Ambulatory Visit: Payer: Self-pay | Admitting: Internal Medicine

## 2019-11-09 DIAGNOSIS — D500 Iron deficiency anemia secondary to blood loss (chronic): Secondary | ICD-10-CM

## 2019-11-09 DIAGNOSIS — D508 Other iron deficiency anemias: Secondary | ICD-10-CM

## 2019-11-09 DIAGNOSIS — D509 Iron deficiency anemia, unspecified: Secondary | ICD-10-CM

## 2019-11-11 ENCOUNTER — Telehealth: Payer: Self-pay | Admitting: Internal Medicine

## 2019-11-11 DIAGNOSIS — G4733 Obstructive sleep apnea (adult) (pediatric): Secondary | ICD-10-CM | POA: Diagnosis not present

## 2019-11-11 NOTE — Telephone Encounter (Signed)
Patient states he is returning a call and states you needed some information from him but he is not sure what kind of information

## 2019-11-12 ENCOUNTER — Other Ambulatory Visit: Payer: Self-pay | Admitting: Internal Medicine

## 2019-11-12 ENCOUNTER — Other Ambulatory Visit (HOSPITAL_COMMUNITY): Payer: Self-pay | Admitting: Cardiology

## 2019-11-12 DIAGNOSIS — I5032 Chronic diastolic (congestive) heart failure: Secondary | ICD-10-CM

## 2019-11-12 DIAGNOSIS — E118 Type 2 diabetes mellitus with unspecified complications: Secondary | ICD-10-CM

## 2019-11-12 DIAGNOSIS — E1022 Type 1 diabetes mellitus with diabetic chronic kidney disease: Secondary | ICD-10-CM

## 2019-11-12 DIAGNOSIS — E039 Hypothyroidism, unspecified: Secondary | ICD-10-CM

## 2019-11-12 DIAGNOSIS — I10 Essential (primary) hypertension: Secondary | ICD-10-CM

## 2019-11-12 NOTE — Telephone Encounter (Signed)
Left detailed message for pt informing that I did not have anything for him and not sure where the message came from. Also stated that he has an appointment coming up for the iron infusion and to make sure he keeps it and to call me back if I can be of any assistance.

## 2019-11-18 ENCOUNTER — Ambulatory Visit (HOSPITAL_COMMUNITY): Payer: Medicare Other

## 2019-11-20 ENCOUNTER — Telehealth: Payer: Self-pay

## 2019-11-20 NOTE — Telephone Encounter (Signed)
1.Medication Requested:Accu-Chek Softclix Lancets lancets  2. Pharmacy (Name, Street, [place]): [facility]   3. On Med List: Yes   4. Last Visit with PCP: 2.11.2021   5. Next visit date with PCP: pending   6. 90 days supply     Agent: Please be advised that RX refills may take up to 3 business days. We ask that you follow-up with your pharmacy.

## 2019-11-22 ENCOUNTER — Other Ambulatory Visit: Payer: Self-pay | Admitting: Internal Medicine

## 2019-11-22 MED ORDER — ACCU-CHEK SOFTCLIX LANCETS MISC: 5 refills | Status: DC

## 2019-11-22 NOTE — Telephone Encounter (Signed)
erx sent as requested.  

## 2019-11-27 ENCOUNTER — Other Ambulatory Visit: Payer: Self-pay

## 2019-11-27 ENCOUNTER — Ambulatory Visit (HOSPITAL_COMMUNITY)
Admission: RE | Admit: 2019-11-27 | Discharge: 2019-11-27 | Disposition: A | Discharge status: Home / Self Care | Payer: Medicare Other | Source: Ambulatory Visit | Attending: Internal Medicine | Admitting: Internal Medicine

## 2019-11-27 DIAGNOSIS — D508 Other iron deficiency anemias: Secondary | ICD-10-CM

## 2019-11-27 MED ORDER — SODIUM CHLORIDE 0.9 % IV SOLN
INTRAVENOUS | Status: DC | PRN
  Administered 2019-11-27: 250 mL via INTRAVENOUS

## 2019-11-27 MED ORDER — SODIUM CHLORIDE 0.9 % IV SOLN
750.0000 mg | Freq: Once | INTRAVENOUS | Status: AC
  Administered 2019-11-27: 750 mg via INTRAVENOUS
  Filled 2019-11-27: qty 15

## 2019-11-27 NOTE — Discharge Instructions (Signed)

## 2019-11-27 NOTE — Progress Notes (Signed)
PATIENT CARE CENTER NOTE  Diagnosis: Other iron deficiency anemia (D50.8)   Provider: Scarlette Calico, MD   Procedure: Christeen Douglas IV   Note: Patient received Injectafer via PIV. Tolerated well with no adverse reaction. Observed patient for 30 minutes post-infusion. Vital signs stable. Discharge instructions given. Patient to come back next week for second dose. Alert, oriented and ambulatory at discharge.

## 2019-12-04 ENCOUNTER — Other Ambulatory Visit: Payer: Self-pay

## 2019-12-04 ENCOUNTER — Ambulatory Visit (HOSPITAL_COMMUNITY)
Admission: RE | Admit: 2019-12-04 | Discharge: 2019-12-04 | Disposition: A | Discharge status: Home / Self Care | Payer: Medicare Other | Source: Ambulatory Visit | Attending: Internal Medicine | Admitting: Internal Medicine

## 2019-12-04 DIAGNOSIS — D508 Other iron deficiency anemias: Secondary | ICD-10-CM | POA: Diagnosis not present

## 2019-12-04 MED ORDER — SODIUM CHLORIDE 0.9 % IV SOLN
750.0000 mg | Freq: Once | INTRAVENOUS | Status: AC
  Administered 2019-12-04: 750 mg via INTRAVENOUS
  Filled 2019-12-04: qty 15

## 2019-12-04 MED ORDER — SODIUM CHLORIDE 0.9 % IV SOLN
INTRAVENOUS | Status: DC | PRN
  Administered 2019-12-04: 250 mL via INTRAVENOUS

## 2019-12-04 NOTE — Discharge Instructions (Signed)

## 2019-12-05 ENCOUNTER — Other Ambulatory Visit: Payer: Self-pay | Admitting: Internal Medicine

## 2019-12-09 DIAGNOSIS — G4733 Obstructive sleep apnea (adult) (pediatric): Secondary | ICD-10-CM | POA: Diagnosis not present

## 2019-12-10 ENCOUNTER — Telehealth: Payer: Self-pay | Admitting: Internal Medicine

## 2019-12-10 ENCOUNTER — Other Ambulatory Visit: Payer: Self-pay | Admitting: *Deleted

## 2019-12-10 ENCOUNTER — Encounter: Payer: Self-pay | Admitting: *Deleted

## 2019-12-10 NOTE — Telephone Encounter (Signed)
We appreciate [name] contacting us - pt has been on simvastatin, atorvastatin and zetia. Pt is non compliant.

## 2019-12-10 NOTE — Telephone Encounter (Signed)
New message:   [name] is calling from [facility] and states the patient has diabetes and is not on any Statin medication. He asks that the doctor evaluate him. Please advise.

## 2019-12-10 NOTE — Patient Outreach (Signed)
[facility]) Care Management  12/10/2019  [name] May 02, 1956 [id number]   RN Health Coach Monthly Outreach  Referral Date:  11/27/2018 Referral Source:  MD Referral Screening Reason for Referral:  Disease Management Education  Insurance:  [employer]   Outreach Attempt:  Successful telephone outreach to patient for follow up.  HIPAA verified with patient.  Patient reporting he is having feelings of "jittery" "not feeling like himself" with swelling in his hands limiting his finger movements.  Has appointment with primary care provider tomorrow.  States he recently had 2 iron injections.  States he had to give his CPAP machine back to DME agency due to inability to afford the monthly copay.  Offered to seek assistance from [place] to verify with DME agency copay (patient declines assistance at this time).  Has not checked blood sugar today as of yet.  Reports fasting blood sugar yesterday was 77 with fasting ranges of 70-150's.  Continues to report he does not have working home scale to weigh daily.  Appointments:  Patient has scheduled appointment with primary care provider, Dr. [name] on 12/11/19.  Encouraged patient to schedule cardiology follow up appointment.  Plan: RN Health Coach will make next telephone outreach to patient within the month of May and patient agrees to future outreach.  [place] [phone] [name]

## 2019-12-11 ENCOUNTER — Ambulatory Visit: Payer: Medicare Other | Admitting: Internal Medicine

## 2019-12-23 ENCOUNTER — Other Ambulatory Visit: Payer: Self-pay | Admitting: Internal Medicine

## 2020-01-09 DIAGNOSIS — G4733 Obstructive sleep apnea (adult) (pediatric): Secondary | ICD-10-CM | POA: Diagnosis not present

## 2020-01-15 ENCOUNTER — Ambulatory Visit (INDEPENDENT_AMBULATORY_CARE_PROVIDER_SITE_OTHER): Payer: Medicare Other

## 2020-01-15 ENCOUNTER — Ambulatory Visit (INDEPENDENT_AMBULATORY_CARE_PROVIDER_SITE_OTHER): Payer: Medicare Other | Admitting: Podiatry

## 2020-01-15 ENCOUNTER — Other Ambulatory Visit: Payer: Self-pay | Admitting: Podiatry

## 2020-01-15 ENCOUNTER — Other Ambulatory Visit: Payer: Self-pay

## 2020-01-15 DIAGNOSIS — M79674 Pain in right toe(s): Secondary | ICD-10-CM

## 2020-01-15 DIAGNOSIS — M7751 Other enthesopathy of right foot: Secondary | ICD-10-CM | POA: Diagnosis not present

## 2020-01-15 DIAGNOSIS — B351 Tinea unguium: Secondary | ICD-10-CM | POA: Diagnosis not present

## 2020-01-15 DIAGNOSIS — E1142 Type 2 diabetes mellitus with diabetic polyneuropathy: Secondary | ICD-10-CM

## 2020-01-15 DIAGNOSIS — M898X9 Other specified disorders of bone, unspecified site: Secondary | ICD-10-CM

## 2020-01-15 DIAGNOSIS — M79675 Pain in left toe(s): Secondary | ICD-10-CM | POA: Diagnosis not present

## 2020-01-16 ENCOUNTER — Encounter: Payer: Self-pay | Admitting: Podiatry

## 2020-01-16 NOTE — Progress Notes (Signed)
Subjective:  Patient ID: [name], male    DOB: 05/14/1956,  MRN: [id number]  Chief Complaint  Patient presents with  . Toe Pain    pt is here for a swollen toe    64 y.o. male presents with the above complaint.  Patient presents with complaint of right hallux digit swelling as well as deformity with mild pain to the palpation.  Patient is diabetic with neuropathy to the both lower extremity.  Patient states that there has not been an ulcer however the toe does look a little bit swollen.  Patient had a history of breaking the toe long time ago that was never supported properly cared for which may have led to this exostosis.  He would like to also discuss thickened elongated dystrophic toenails x10.  He would like to have them debrided down as he is in a wheelchair and is unable to do it himself.  They are painful when palpating.  He denies any other acute complaints.   Review of Systems: Negative except as noted in the HPI. Denies N/V/F/Ch.  Past Medical History:  Diagnosis Date  . Anemia    iron  . CHF (congestive heart failure) (HCC)    Presumed diastolic. Echo (06/07) w.EF 45%, severe posterior HK, mild LV hypertrophy, No further work-up of abnormal echo was done. pt denies CHF  . Colon cancer ([place]) 2007   s/p sigmoid colectomy  . Diabetes mellitus    Has Hx of diabetic foot ulcer & peripheral neuropathy  type2  . Dyspnea    w/ activity  . History of PFTs 05/2009   Mild Obstructive defect  . HTN (hypertension)   . Hyperlipidemia   . Morbid obesity ([place])   . OSA on CPAP   . Pneumonia    week ago     Current Outpatient Medications:  .  ACCU-CHEK AVIVA PLUS test strip, USE TO CHECK BLOOD SUGAR THREE TIMES DAILY., Disp: 100 strip, Rfl: 12 .  Accu-Chek Softclix Lancets lancets, CHECK BLOOD SUGAR THREE TIMES DAILY, Disp: 100 each, Rfl: 5 .  albuterol (PROVENTIL HFA;VENTOLIN HFA) 108 (90 Base) MCG/ACT inhaler, Inhale 2 puffs into the lungs every 6 (six) hours as needed for  wheezing or shortness of breath., Disp: 1 Inhaler, Rfl: 1 .  aspirin EC 81 MG tablet, Take 81 mg by mouth daily. , Disp: , Rfl:  .  atorvastatin (LIPITOR) 40 MG tablet, Take 1 tablet (40 mg total) by mouth daily., Disp: 90 tablet, Rfl: 1 .  Beta Carotene (VITAMIN A) 25000 UNIT capsule, Take 25,000 Units by mouth daily., Disp: , Rfl:  .  bisoprolol (ZEBETA) 10 MG tablet, Take 1 tablet (10 mg total) by mouth every evening., Disp: 90 tablet, Rfl: 1 .  blood glucose meter kit and supplies KIT, Use to check blood sugar three times daily. DX: E11.8, Disp: 1 each, Rfl: 0 .  Cholecalciferol 2000 units TABS, Take 1 tablet (2,000 Units total) by mouth daily., Disp: 90 tablet, Rfl: 1 .  cyanocobalamin 2000 MCG tablet, Take 1 tablet (2,000 mcg total) by mouth daily., Disp: 90 tablet, Rfl: 1 .  cycloSPORINE (RESTASIS) 0.05 % ophthalmic emulsion, Place 1 drop into both eyes daily., Disp: , Rfl:  .  Empagliflozin-metFORMIN HCl (SYNJARDY) 01-999 MG TABS, Take 1 tablet by mouth 2 (two) times daily., Disp: 180 tablet, Rfl: 1 .  FEROSUL 325 (65 Fe) MG tablet, TAKE 1 TABLET BY MOUTH 2 TIMES DAILY WITH A MEAL., Disp: 180 tablet, Rfl: 1 .  furosemide (  LASIX) 80 MG tablet, Take 1.5 tablets (120 mg total) by mouth every morning AND 1 tablet (80 mg total) at bedtime., Disp: 180 tablet, Rfl: 3 .  HYDROcodone-acetaminophen (NORCO) 10-325 MG tablet, TAKE 1 TABLET BY MOUTH EVERY 6 HOURS AS NEEDED., Disp: 60 tablet, Rfl: 0 .  Insulin Detemir (LEVEMIR FLEXTOUCH) 100 UNIT/ML Pen, Inject 240 Units into the skin daily., Disp: , Rfl:  .  Insulin Pen Needle (ULTICARE SHORT PEN NEEDLES) 31G X 8 MM MISC, USE WITH INSULIN PENS DAILY., Disp: 100 each, Rfl: 3 .  levothyroxine (SYNTHROID) 50 MCG tablet, TAKE 1 TABLET BY MOUTH DAILY BEFORE BREAKFAST., Disp: 90 tablet, Rfl: 0 .  losartan (COZAAR) 25 MG tablet, Take 2 tablets (50 mg total) by mouth daily., Disp: 180 tablet, Rfl: 1 .  Methylnaltrexone Bromide (RELISTOR) 150 MG TABS, Take 3  tablets by mouth daily., Disp: 66 tablet, Rfl: 0 .  Multiple Vitamins-Minerals (MULTIVITAMIN ADULT PO), Take by mouth., Disp: , Rfl:  .  omeprazole (PRILOSEC) 40 MG capsule, Take 1 tablet by mouth 30 min before breakfast., Disp: 90 capsule, Rfl: 3 .  phentermine 37.5 MG capsule, TAKE 1 CAPSULE BY MOUTH EVERY MORNING., Disp: 30 capsule, Rfl: 0 .  potassium chloride SA (KLOR-CON) 20 MEQ tablet, Take 1 tablet (20 mEq total) by mouth 2 (two) times daily., Disp: 180 tablet, Rfl: 3 .  sildenafil (REVATIO) 20 MG tablet, Take 4 tablets (80 mg total) by mouth daily as needed., Disp: 60 tablet, Rfl: 5 .  thiamine (VITAMIN B-1) 100 MG tablet, TAKE 1 TABLET BY MOUTH DAILY., Disp: 90 tablet, Rfl: 1 .  VENTOLIN HFA 108 (90 Base) MCG/ACT inhaler, INHALE 2 PUFFS INTO THE LUNGS EVERY 6 HOURS AS NEEDED FOR WHEEZING OR SHORTNESS OF BREATH., Disp: 18 g, Rfl: 2  Social History   Tobacco Use  Smoking Status Never Smoker  Smokeless Tobacco Never Used    Allergies  Allergen Reactions  . Shellfish Allergy Anaphylaxis  . Lisinopril Cough   Objective:  There were no vitals filed for this visit. There is no height or weight on file to calculate BMI. Constitutional Well developed. Well nourished.  Vascular Dorsalis pedis pulses palpable bilaterally. Posterior tibial pulses palpable bilaterally. Capillary refill normal to all digits.  No cyanosis or clubbing noted. Pedal hair growth normal.  Neurologic Normal speech. Oriented to person, place, and time. Epicritic sensation to light touch grossly present bilaterally.  Dermatologic  thickened elongated dystrophic mycotic toenails x10.  Mild pain on palpation. No open wounds. No skin lesions.  Orthopedic:  No pain on palpation to the right hallux IPJ exostosis.  No open wound or ulceration noted.  No callus formation noted to the dorsal aspect of the digit.  Limited range of motion noted at the IPJ.  Good/adequate range of motion noted of the metatarsophalangeal  joint.   Radiographs: 3 views of skeletally mature adult right foot: Exostosis noted at the hallux IPJ with severe arthritis of the interphalangeal joint of the hallux.  No loose osteophytes noted. Assessment:   1. Diabetic peripheral neuropathy ([place])    Plan:  Patient was evaluated and treated and all questions answered.  Right hallux interphalangeal joint exostosis -I explained to the patient the etiology of exostosis likely due to history of trauma to that area.  It appears that there was a chronic fracture that may have healed and very malpositioned.  At this time it does not appear to be causing any kind of ulcer formation or any worsening of the  deformity therefore I believe patient will benefit from conservative care with offloading.  I discussed with the patient in the future if this ever opens a new ulceration or causes any issues we can address this surgically at that time.  Patient's last A1c is also greater than 8 and therefore a high risk of wound complication.  I have encouraged patient to bring the A1c down below 8 to consider for any elective procedures.  Patient states understanding   Onychomycosis with pain  -Nails palliatively debrided as below. -Educated on self-care  Procedure: Nail Debridement Rationale: pain  Type of Debridement: manual, sharp debridement. Instrumentation: Nail nipper, rotary burr. Number of Nails: 10  Procedures and Treatment: Consent by patient was obtained for treatment procedures. The patient understood the discussion of treatment and procedures well. All questions were answered thoroughly reviewed. Debridement of mycotic and hypertrophic toenails, 1 through 5 bilateral and clearing of subungual debris. No ulceration, no infection noted.  Return Visit-Office Procedure: Patient instructed to return to the office for a follow up visit 3 months for continued evaluation and treatment.  Boneta Lucks, DPM    No follow-ups on file.   No follow-ups  on file.

## 2020-01-20 ENCOUNTER — Encounter: Payer: Self-pay | Admitting: Internal Medicine

## 2020-01-20 ENCOUNTER — Other Ambulatory Visit: Payer: Self-pay | Admitting: Internal Medicine

## 2020-01-20 ENCOUNTER — Telehealth (INDEPENDENT_AMBULATORY_CARE_PROVIDER_SITE_OTHER): Payer: Medicare Other | Admitting: Internal Medicine

## 2020-01-20 ENCOUNTER — Other Ambulatory Visit: Payer: Self-pay

## 2020-01-20 DIAGNOSIS — R0602 Shortness of breath: Secondary | ICD-10-CM

## 2020-01-20 DIAGNOSIS — E785 Hyperlipidemia, unspecified: Secondary | ICD-10-CM

## 2020-01-20 MED ORDER — ATORVASTATIN CALCIUM 40 MG PO TABS: 40.0000 mg | ORAL_TABLET | Freq: Every day | ORAL | 1 refills | Status: DC

## 2020-01-20 NOTE — Progress Notes (Signed)
Virtual Visit via Video Note  I connected with [name] on 01/20/20 at  9:30 AM EDT by a video enabled telemedicine application and verified that I am speaking with the correct person using two identifiers.   I discussed the limitations of evaluation and management by telemedicine and the availability of in person appointments. The patient expressed understanding and agreed to proceed.  Present for the visit:  Myself, Dr Billey Gosling, [name].  The patient is currently at home and I am in the office.    No referring provider.    History of Present Illness: This visit is for an acute visit for cough.    His symptoms started yesterday.  He was feeling weak yesterday and just not feeling well.  As the day went on he felt some shortness of breath.   Last night he started coughing a little.  Today he is trying to catch his breath when he walks around his house.  He still feels a little weak.  His symptoms are not that bad and he states he can handle them.  He needs a note to get out of going to court tomorrow.  He does not think that he can physically go there.  He states he is the one who is suing someone so he does want to go, just wants the case postponed because of how he feels.   He denies sick contacts.  He is only in his house.  He took the covid vaccine.     Review of Systems  Constitutional: Positive for malaise/fatigue. Negative for chills and fever.  HENT: Positive for tinnitus. Negative for congestion, ear pain, sinus pain and sore throat.   Respiratory: Positive for cough (mild, started last night -dry), shortness of breath and wheezing (last night - minimal).   Cardiovascular: Positive for leg swelling (left swollen - larger than usual - more than right leg swelling.  ). Negative for chest pain and orthopnea.  Gastrointestinal: Negative for diarrhea and nausea.  Neurological: Negative for dizziness and headaches.      Social History   Socioeconomic History  .  Marital status: Legally Separated    Spouse name: Not on file  . Number of children: 0  . Years of education: Not on file  . Highest education level: Not on file  Occupational History  . Occupation: Human resources officer  Tobacco Use  . Smoking status: Never Smoker  . Smokeless tobacco: Never Used  Substance and Sexual Activity  . Alcohol use: Not Currently    Alcohol/week: 0.0 standard drinks    Comment: states he does not drink   . Drug use: No  . Sexual activity: Not Currently  Other Topics Concern  . 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   In new relationship (04/2009) - girlfriend helps taste for cooking business and helps monitor for ulcers.   Regular Exercise- No      Social Determinants of Health   Financial Resource Strain:   . Difficulty of Paying Living Expenses:   Food Insecurity:   . Worried About Charity fundraiser in the Last Year:   . Arboriculturist in the Last Year:   Transportation Needs: No Transportation  Needs  . Lack of Transportation (Medical): No  . Lack of Transportation (Non-Medical): No  Physical Activity: Inactive  . Days of Exercise per Week: 0 days  . Minutes of Exercise per Session: 0 min  Stress: Stress Concern Present  . Feeling of Stress : To some extent  Social Connections:   . Frequency of Communication with Friends and Family:   . Frequency of Social Gatherings with Friends and Family:   . Attends Religious Services:   . Active Member of Clubs or Organizations:   . Attends Archivist Meetings:   Marland Kitchen Marital Status:      Observations/Objective: Appears well in NAD Coughing intermittent - dry cough Breathing normal at rest - does not appear to be SOB, speaking in full sentences Left leg swollen, right  leg swollen, but unable to see in detail    Assessment and Plan:  Shortness of breath, mild cough, fatigue: Possible URI-viral in nature Possibly CHF exacerbation His symptoms at this point overall are mild and is concerned for today is in note to get him out of going to court tomorrow, which I will write At this point he will continue symptomatic treatment Advised that he does need to be seen in person if the symptoms do not improve over the next day-he agrees to be seen in person tomorrow if he is still not feeling well No change in medication   See Problem List for Assessment and Plan of chronic medical problems.   Follow Up Instructions:    I discussed the assessment and treatment plan with the patient. The patient was provided an opportunity to ask questions and all were answered. The patient agreed with the plan and demonstrated an understanding of the instructions.   The patient was advised to call back or seek an in-person evaluation if the symptoms worsen or if the condition fails to improve as anticipated.    Binnie Rail, MD

## 2020-01-24 ENCOUNTER — Other Ambulatory Visit: Payer: Self-pay | Admitting: *Deleted

## 2020-01-24 ENCOUNTER — Ambulatory Visit: Payer: Medicare Other | Admitting: Family

## 2020-01-24 NOTE — Patient Outreach (Signed)
[facility]) Care Management  01/24/2020  Menno Vanbergen 26-Dec-1955 [id number]   [place] Monthly Outreach  Referral Date:11/27/2018 Referral Source:MD Referral Screening Reason for Referral:Disease Management Education Insurance:[employer] Medicare   Outreach Attempt:  Outreach attempt #1 to patient for follow up. No answer. RN Health Coach left HIPAA compliant voicemail message along with contact information.  Plan:  RN Health Coach will make another outreach attempt within the month of June if no return call back from patient.  [place] [phone] [email or web address]

## 2020-01-27 ENCOUNTER — Ambulatory Visit: Payer: Medicare Other | Admitting: Family

## 2020-01-28 ENCOUNTER — Other Ambulatory Visit: Payer: Self-pay | Admitting: Internal Medicine

## 2020-01-28 ENCOUNTER — Other Ambulatory Visit: Payer: Self-pay

## 2020-01-28 ENCOUNTER — Ambulatory Visit (INDEPENDENT_AMBULATORY_CARE_PROVIDER_SITE_OTHER): Payer: Medicare Other | Admitting: Family

## 2020-01-28 VITALS — BP 124/68 | HR 74 | Temp 98.2°F | Ht 72.0 in

## 2020-01-28 DIAGNOSIS — M159 Polyosteoarthritis, unspecified: Secondary | ICD-10-CM

## 2020-01-28 DIAGNOSIS — M6530 Trigger finger, unspecified finger: Secondary | ICD-10-CM | POA: Diagnosis not present

## 2020-01-28 DIAGNOSIS — M5416 Radiculopathy, lumbar region: Secondary | ICD-10-CM

## 2020-01-28 DIAGNOSIS — L299 Pruritus, unspecified: Secondary | ICD-10-CM | POA: Diagnosis not present

## 2020-01-28 DIAGNOSIS — E1142 Type 2 diabetes mellitus with diabetic polyneuropathy: Secondary | ICD-10-CM

## 2020-01-28 DIAGNOSIS — K439 Ventral hernia without obstruction or gangrene: Secondary | ICD-10-CM

## 2020-01-28 NOTE — Telephone Encounter (Signed)
Done erx 

## 2020-01-28 NOTE — Progress Notes (Signed)
[name] is a 64 y.o. male with the following history as recorded in EpicCare:  Patient Active Problem List   Diagnosis Date Noted  . Shortness of breath 01/20/2020  . Right lower quadrant abdominal tenderness without rebound tenderness 10/31/2019  . Nasal trauma, initial encounter 10/31/2019  . Drug-induced erectile dysfunction 05/22/2019  . Onychomycosis of toenail 05/21/2019  . Class 3 obesity with alveolar hypoventilation, serious comorbidity, and body mass index (BMI) of 50.0 to 59.9 in adult ([place]) 02/07/2019  . Therapeutic opioid-induced constipation (OIC) 05/31/2018  . Pseudophakia of right eye 05/15/2018  . Long-term current use of opiate analgesic 02/15/2018  . Left lumbar radiculopathy 01/29/2018  . B12 deficiency 10/17/2017  . Bleeding internal hemorrhoids   . Insomnia 06/30/2017  . Combined forms of age-related cataract of both eyes 04/20/2017  . Left eye affected by proliferative diabetic retinopathy with traction retinal detachment not involving macula, associated with type 2 diabetes mellitus ([place]) 04/20/2017  . Right eye affected by proliferative diabetic retinopathy with traction retinal detachment involving macula, associated with type 2 diabetes mellitus ([place]) 04/20/2017  . Vitreous hemorrhage of both eyes ([place]) 04/20/2017  . Status post corneal transplant 04/20/2017  . Corneal scars, both eyes 02/06/2017  . Cortical senile cataract, left 02/06/2017  . Nuclear sclerotic cataract of both eyes 02/06/2017  . Peripheral corneal opacity, bilateral 02/06/2017  . Vitamin D deficiency 12/23/2016  . Thiamine deficiency 08/02/2016  . Abdominal wall cellulitis 02/12/2016  . Primary osteoarthritis involving multiple joints 08/03/2015  . Diabetes mellitus type II, uncontrolled ([place]) 05/23/2014  . Herpesviral keratitis 05/23/2014  . Traction detachment of retina 05/23/2014  . Diabetes mellitus due to underlying condition with both eyes affected by proliferative retinopathy  without macular edema, with long-term current use of insulin ([place]) 05/23/2014  . Low back pain 02/05/2014  . Diabetic foot ulcer 02/05/2014  . Iron deficiency anemia 01/09/2014  . Osteoarthritis of hip 05/08/2013  . Diabetic nephropathy ([place]) 05/07/2013  . Bariatric surgery status 01/29/2013  . Benign prostatic hyperplasia without lower urinary tract symptoms 01/11/2013  . Hypothyroidism 01/11/2013  . Colon cancer ([place]) 10/04/2012  . Chronic diastolic CHF (congestive heart failure) ([place]) 04/13/2012  . Preventative health care 04/08/2011  . Diabetic peripheral neuropathy ([place]) 05/29/2008  . OSA (obstructive sleep apnea) 10/01/2007  . CARDIOMYOPATHY 10/01/2007  . Obesity, unspecified 10/01/2007  . Type II diabetes mellitus with manifestations ([place]) 07/24/2007  . Hyperlipidemia with target LDL less than 100 07/24/2007  . Essential hypertension 07/24/2007    Current Outpatient Medications  Medication Sig Dispense Refill  . ACCU-CHEK AVIVA PLUS test strip USE TO CHECK BLOOD SUGAR THREE TIMES DAILY. 100 strip 12  . Accu-Chek Softclix Lancets lancets CHECK BLOOD SUGAR THREE TIMES DAILY 100 each 5  . albuterol (PROVENTIL HFA;VENTOLIN HFA) 108 (90 Base) MCG/ACT inhaler Inhale 2 puffs into the lungs every 6 (six) hours as needed for wheezing or shortness of breath. 1 Inhaler 1  . aspirin EC 81 MG tablet Take 81 mg by mouth daily.     Marland Kitchen atorvastatin (LIPITOR) 40 MG tablet Take 1 tablet (40 mg total) by mouth daily. 90 tablet 1  . Beta Carotene (VITAMIN A) 25000 UNIT capsule Take 25,000 Units by mouth daily.    . bisoprolol (ZEBETA) 10 MG tablet Take 1 tablet (10 mg total) by mouth every evening. 90 tablet 1  . blood glucose meter kit and supplies KIT Use to check blood sugar three times daily. DX: E11.8 1 each 0  . Cholecalciferol 2000  units TABS Take 1 tablet (2,000 Units total) by mouth daily. 90 tablet 1  . cyanocobalamin 2000 MCG tablet Take 1 tablet (2,000 mcg total) by mouth daily. 90  tablet 1  . cycloSPORINE (RESTASIS) 0.05 % ophthalmic emulsion Place 1 drop into both eyes daily.    . Empagliflozin-metFORMIN HCl (SYNJARDY) 01-999 MG TABS Take 1 tablet by mouth 2 (two) times daily. 180 tablet 1  . FEROSUL 325 (65 Fe) MG tablet TAKE 1 TABLET BY MOUTH 2 TIMES DAILY WITH A MEAL. 180 tablet 1  . furosemide (LASIX) 80 MG tablet Take 1.5 tablets (120 mg total) by mouth every morning AND 1 tablet (80 mg total) at bedtime. 180 tablet 3  . HYDROcodone-acetaminophen (NORCO) 10-325 MG tablet TAKE 1 TABLET BY MOUTH EVERY 6 HOURS AS NEEDED. 60 tablet 0  . Insulin Detemir (LEVEMIR FLEXTOUCH) 100 UNIT/ML Pen Inject 240 Units into the skin daily.    . Insulin Pen Needle (ULTICARE SHORT PEN NEEDLES) 31G X 8 MM MISC USE WITH INSULIN PENS DAILY. 100 each 3  . levothyroxine (SYNTHROID) 50 MCG tablet TAKE 1 TABLET BY MOUTH DAILY BEFORE BREAKFAST. 90 tablet 0  . losartan (COZAAR) 25 MG tablet Take 2 tablets (50 mg total) by mouth daily. 180 tablet 1  . Methylnaltrexone Bromide (RELISTOR) 150 MG TABS Take 3 tablets by mouth daily. 66 tablet 0  . Multiple Vitamins-Minerals (MULTIVITAMIN ADULT PO) Take by mouth.    Marland Kitchen omeprazole (PRILOSEC) 40 MG capsule Take 1 tablet by mouth 30 min before breakfast. 90 capsule 3  . phentermine 37.5 MG capsule TAKE 1 CAPSULE BY MOUTH EVERY MORNING. 30 capsule 0  . potassium chloride SA (KLOR-CON) 20 MEQ tablet Take 1 tablet (20 mEq total) by mouth 2 (two) times daily. 180 tablet 3  . sildenafil (REVATIO) 20 MG tablet Take 4 tablets (80 mg total) by mouth daily as needed. 60 tablet 5  . thiamine (VITAMIN B-1) 100 MG tablet TAKE 1 TABLET BY MOUTH DAILY. 90 tablet 1  . VENTOLIN HFA 108 (90 Base) MCG/ACT inhaler INHALE 2 PUFFS INTO THE LUNGS EVERY 6 HOURS AS NEEDED FOR WHEEZING OR SHORTNESS OF BREATH. 18 g 2   No current facility-administered medications for this visit.    Allergies: Shellfish allergy and Lisinopril  Past Medical History:  Diagnosis Date  . Anemia     iron  . CHF (congestive heart failure) (HCC)    Presumed diastolic. Echo (06/07) w.EF 45%, severe posterior HK, mild LV hypertrophy, No further work-up of abnormal echo was done. pt denies CHF  . Colon cancer ([place]) 2007   s/p sigmoid colectomy  . Diabetes mellitus    Has Hx of diabetic foot ulcer & peripheral neuropathy  type2  . Dyspnea    w/ activity  . History of PFTs 05/2009   Mild Obstructive defect  . HTN (hypertension)   . Hyperlipidemia   . Morbid obesity ([place])   . OSA on CPAP   . Pneumonia    week ago     Past Surgical History:  Procedure Laterality Date  . BARIATRIC SURGERY  12/2009   Lap Band/At [facility]  . COLONOSCOPY  multiple  . COLONOSCOPY WITH PROPOFOL N/A 11/26/2012   Procedure: COLONOSCOPY WITH PROPOFOL;  Surgeon: Gatha Mayer, MD;  Location: [facility];  Service: Endoscopy;  Laterality: N/A;  may need pre appt. with anesthesia due to morbid obesity  . COLONOSCOPY WITH PROPOFOL N/A 07/11/2017   Procedure: COLONOSCOPY WITH PROPOFOL;  Surgeon: Gatha Mayer, MD;  Location: [facility];  Service: Endoscopy;  Laterality: N/A;  . [place]   '80/Right  '84/Left  . EYE SURGERY Right 05/2017  . HERNIA REPAIR    . HIP SURGERY     bilateral  . INCISIONAL HERNIA REPAIR N/A 12/18/2013   Procedure:  REPAIR OF INCARCERATED INCISIONAL HERNIA;  Surgeon: Rolm Bookbinder, MD;  Location: [facility];  Service: General;  Laterality: N/A;  . INSERTION OF MESH N/A 12/18/2013   Procedure: INSERTION OF MESH;  Surgeon: Rolm Bookbinder, MD;  Location: [facility];  Service: General;  Laterality: N/A;  . Sigmoid Colectomy  10/2005   [place]  . TONSILLECTOMY      Family History  Problem Relation Age of Onset  . Hepatitis Mother        hepatitis C  . Diabetes Mother   . Congestive Heart Failure Mother   . CAD Mother   . Heart attack Brother 39  . Diabetes Father   . Heart failure Brother   . Stomach cancer Neg Hx   . Colon cancer Neg Hx     Social History    Tobacco Use  . Smoking status: Never Smoker  . Smokeless tobacco: Never Used  Substance Use Topics  . Alcohol use: Not Currently    Alcohol/week: 0.0 standard drinks    Comment: states he does not drink     Subjective:  Patient notes that on 2 separate occasions in the past week he has experienced a sensation of itching along his left forearm; no rash is present; sensation only lasts for a few seconds and he feels "sharp sting" down into his left palm;  Also concerned about locking sensation of both of his ring fingers- wondering if he needs an X-ray;  He also mentions that he is frustrated that he has been told he is not a surgical candidate for his hernia by surgeons here in [place]; is hoping to get second opinion at [facility].      Objective:  Vitals:   01/28/20 1210  BP: 124/68  Pulse: 74  Temp: 98.2 F (36.8 C)  TempSrc: Oral  SpO2: 99%  Height: 6' (1.829 m)    General: Well developed, well nourished, in no acute distress  Skin : Warm and dry.  Head: Normocephalic and atraumatic  Lungs: Respirations unlabored; clear to auscultation bilaterally without wheeze, rales, rhonchi  Musculoskeletal: No deformities; no active joint inflammation  Extremities: No edema, cyanosis, clubbing  Vessels: Symmetric bilaterally  Neurologic: Alert and oriented; speech intact; face symmetrical; in motorized wheelchair today; Abdominal wall hernia is noted on exam  Assessment:  1. Trigger finger, unspecified finger, unspecified laterality   2. Morbid obesity ([place])   3. Ventral hernia without obstruction or gangrene     Plan:  1. Refer to orthopedics for treatment; 2. Continue working on weight loss goals; discussed concern for hernia surgery at his current size; keep planned follow up with surgeon at [facility].  3. Encouraged him to try an apply moisturizing cream to his left forearm; no other abnormality noted to explain his itching sensation.   This visit occurred during  the SARS-CoV-2 public health emergency.  Safety protocols were in place, including screening questions prior to the visit, additional usage of staff PPE, and extensive cleaning of exam room while observing appropriate contact time as indicated for disinfecting solutions.     Return in about 2 weeks (around 02/11/2020) for with Dr. [name] 3 month follow up .  Orders  Placed This Encounter  Procedures  . Ambulatory referral to Orthopedic Surgery    Referral Priority:   Routine    Referral Type:   Surgical    Referral Reason:   Specialty Services Required    Requested Specialty:   Orthopedic Surgery    Number of Visits Requested:   1    Requested Prescriptions    No prescriptions requested or ordered in this encounter

## 2020-02-05 ENCOUNTER — Other Ambulatory Visit: Payer: Self-pay | Admitting: Internal Medicine

## 2020-02-06 DIAGNOSIS — R609 Edema, unspecified: Secondary | ICD-10-CM | POA: Diagnosis not present

## 2020-02-06 DIAGNOSIS — L98491 Non-pressure chronic ulcer of skin of other sites limited to breakdown of skin: Secondary | ICD-10-CM | POA: Diagnosis not present

## 2020-02-06 DIAGNOSIS — E1140 Type 2 diabetes mellitus with diabetic neuropathy, unspecified: Secondary | ICD-10-CM | POA: Diagnosis not present

## 2020-02-06 DIAGNOSIS — M150 Primary generalized (osteo)arthritis: Secondary | ICD-10-CM | POA: Diagnosis not present

## 2020-02-07 ENCOUNTER — Encounter: Payer: Self-pay | Admitting: Family Medicine

## 2020-02-07 ENCOUNTER — Other Ambulatory Visit: Payer: Self-pay

## 2020-02-07 ENCOUNTER — Ambulatory Visit (INDEPENDENT_AMBULATORY_CARE_PROVIDER_SITE_OTHER): Payer: Medicare Other | Admitting: Family Medicine

## 2020-02-07 DIAGNOSIS — M65341 Trigger finger, right ring finger: Secondary | ICD-10-CM | POA: Diagnosis not present

## 2020-02-07 DIAGNOSIS — M65342 Trigger finger, left ring finger: Secondary | ICD-10-CM | POA: Diagnosis not present

## 2020-02-07 NOTE — Progress Notes (Signed)
Office Visit Note   Patient: [name]           Date of Birth: 04/20/1956           MRN: [id number] Visit Date: 02/07/2020 Requested by: Marrian Salvage, [place],  [place] [id number] PCP: [name], MD  Subjective: Chief Complaint  Patient presents with  . Right Hand - Pain  . Left Hand - Pain    HPI: He is here with bilateral hand pain.  He is right-hand dominant.  Symptoms started in December, his ring fingers began to hurt and then eventually began to get stuck in flexion.  He would frequently have to pull them straight again.  In the last month or 2 it has gotten worse to the point that he can no longer make a fist.  His fifth fingers are starting to bother him as well but they are not yet getting stuck.  He gets occasional numbness in them.  He has diabetes which is suboptimally controlled per his report.              ROS:   All other systems were reviewed and are negative.  Objective: Vital Signs: There were no vitals taken for this visit.  Physical Exam:  General:  Alert and oriented, in no acute distress. Pulm:  Breathing unlabored. Psy:  Normal mood, congruent affect. Skin: No rash or erythema Hands: He has tender nodules of the A1 pulleys of the fourth fingers both hands.  Passively he has full flexion of the fourth fingers but actively he does not.  Full extension in both.  Full range of motion of the fifth fingers pain-free.  Imaging: No results found.  Assessment & Plan: 1.  Bilateral hand pain with fourth trigger fingers -Discussed various options with him, he wants to try injections.  If this does not help, could contemplate occupational/hand therapy versus surgical release.     Procedures: Bilateral fourth trigger finger injections: After sterile prep with Betadine, injected 2 cc 1% lidocaine without epinephrine and 20 mg methylprednisolone into the region of the A1 pulley.    PMFS History: Patient Active Problem  List   Diagnosis Date Noted  . Shortness of breath 01/20/2020  . Right lower quadrant abdominal tenderness without rebound tenderness 10/31/2019  . Nasal trauma, initial encounter 10/31/2019  . Drug-induced erectile dysfunction 05/22/2019  . Onychomycosis of toenail 05/21/2019  . Class 3 obesity with alveolar hypoventilation, serious comorbidity, and body mass index (BMI) of 50.0 to 59.9 in adult ([place]) 02/07/2019  . Therapeutic opioid-induced constipation (OIC) 05/31/2018  . Pseudophakia of right eye 05/15/2018  . Long-term current use of opiate analgesic 02/15/2018  . Left lumbar radiculopathy 01/29/2018  . B12 deficiency 10/17/2017  . Bleeding internal hemorrhoids   . Insomnia 06/30/2017  . Combined forms of age-related cataract of both eyes 04/20/2017  . Left eye affected by proliferative diabetic retinopathy with traction retinal detachment not involving macula, associated with type 2 diabetes mellitus ([place]) 04/20/2017  . Right eye affected by proliferative diabetic retinopathy with traction retinal detachment involving macula, associated with type 2 diabetes mellitus ([place]) 04/20/2017  . Vitreous hemorrhage of both eyes ([place]) 04/20/2017  . Status post corneal transplant 04/20/2017  . Corneal scars, both eyes 02/06/2017  . Cortical senile cataract, left 02/06/2017  . Nuclear sclerotic cataract of both eyes 02/06/2017  . Peripheral corneal opacity, bilateral 02/06/2017  . Vitamin D deficiency 12/23/2016  . Thiamine deficiency 08/02/2016  . Abdominal  wall cellulitis 02/12/2016  . Primary osteoarthritis involving multiple joints 08/03/2015  . Diabetes mellitus type II, uncontrolled ([place]) 05/23/2014  . Herpesviral keratitis 05/23/2014  . Traction detachment of retina 05/23/2014  . Diabetes mellitus due to underlying condition with both eyes affected by proliferative retinopathy without macular edema, with long-term current use of insulin ([place]) 05/23/2014  . Low back pain 02/05/2014  .  Diabetic foot ulcer 02/05/2014  . Iron deficiency anemia 01/09/2014  . Osteoarthritis of hip 05/08/2013  . Diabetic nephropathy ([place]) 05/07/2013  . Bariatric surgery status 01/29/2013  . Benign prostatic hyperplasia without lower urinary tract symptoms 01/11/2013  . Hypothyroidism 01/11/2013  . Colon cancer ([place]) 10/04/2012  . Chronic diastolic CHF (congestive heart failure) ([place]) 04/13/2012  . Preventative health care 04/08/2011  . Diabetic peripheral neuropathy ([place]) 05/29/2008  . OSA (obstructive sleep apnea) 10/01/2007  . CARDIOMYOPATHY 10/01/2007  . Obesity, unspecified 10/01/2007  . Type II diabetes mellitus with manifestations ([place]) 07/24/2007  . Hyperlipidemia with target LDL less than 100 07/24/2007  . Essential hypertension 07/24/2007   Past Medical History:  Diagnosis Date  . Anemia    iron  . CHF (congestive heart failure) (HCC)    Presumed diastolic. Echo (06/07) w.EF 45%, severe posterior HK, mild LV hypertrophy, No further work-up of abnormal echo was done. pt denies CHF  . Colon cancer ([place]) 2007   s/p sigmoid colectomy  . Diabetes mellitus    Has Hx of diabetic foot ulcer & peripheral neuropathy  type2  . Dyspnea    w/ activity  . History of PFTs 05/2009   Mild Obstructive defect  . HTN (hypertension)   . Hyperlipidemia   . Morbid obesity ([place])   . OSA on CPAP   . Pneumonia    week ago     Family History  Problem Relation Age of Onset  . Hepatitis Mother        hepatitis C  . Diabetes Mother   . Congestive Heart Failure Mother   . CAD Mother   . Heart attack Brother 77  . Diabetes Father   . Heart failure Brother   . Stomach cancer Neg Hx   . Colon cancer Neg Hx     Past Surgical History:  Procedure Laterality Date  . BARIATRIC SURGERY  12/2009   Lap Band/At [facility]  . COLONOSCOPY  multiple  . COLONOSCOPY WITH PROPOFOL N/A 11/26/2012   Procedure: COLONOSCOPY WITH PROPOFOL;  Surgeon: Gatha Mayer, MD;  Location: [facility];  Service:  Endoscopy;  Laterality: N/A;  may need pre appt. with anesthesia due to morbid obesity  . COLONOSCOPY WITH PROPOFOL N/A 07/11/2017   Procedure: COLONOSCOPY WITH PROPOFOL;  Surgeon: Gatha Mayer, MD;  Location: [facility];  Service: Endoscopy;  Laterality: N/A;  . [place]   '80/Right  '84/Left  . EYE SURGERY Right 05/2017  . HERNIA REPAIR    . HIP SURGERY     bilateral  . INCISIONAL HERNIA REPAIR N/A 12/18/2013   Procedure:  REPAIR OF INCARCERATED INCISIONAL HERNIA;  Surgeon: Rolm Bookbinder, MD;  Location: [facility];  Service: General;  Laterality: N/A;  . INSERTION OF MESH N/A 12/18/2013   Procedure: INSERTION OF MESH;  Surgeon: Rolm Bookbinder, MD;  Location: [facility];  Service: General;  Laterality: N/A;  . Sigmoid Colectomy  10/2005   [facility]  . TONSILLECTOMY     Social History   Occupational History  . Occupation: Human resources officer  Tobacco Use  . Smoking  status: Never Smoker  . Smokeless tobacco: Never Used  Substance and Sexual Activity  . Alcohol use: Not Currently    Alcohol/week: 0.0 standard drinks    Comment: states he does not drink   . Drug use: No  . Sexual activity: Not Currently

## 2020-02-07 NOTE — Progress Notes (Signed)
Cant make a fist on both hands Locking of ring fingers of both hands  And Numbness and tingling under nail of pinky finger  Locking started last year No conservative treatment done  Swelling in hands started in December  Takes hydrocodone for pain at night

## 2020-02-12 ENCOUNTER — Ambulatory Visit: Payer: Medicare Other | Admitting: Internal Medicine

## 2020-02-12 LAB — HM DIABETES EYE EXAM

## 2020-02-13 ENCOUNTER — Other Ambulatory Visit: Payer: Self-pay

## 2020-02-13 ENCOUNTER — Ambulatory Visit (INDEPENDENT_AMBULATORY_CARE_PROVIDER_SITE_OTHER): Payer: Medicare Other | Admitting: Internal Medicine

## 2020-02-13 ENCOUNTER — Encounter: Payer: Self-pay | Admitting: Internal Medicine

## 2020-02-13 VITALS — BP 130/70 | HR 72 | Temp 98.1°F | Resp 16 | Ht 72.0 in | Wt >= 6400 oz

## 2020-02-13 DIAGNOSIS — Z6841 Body Mass Index (BMI) 40.0 and over, adult: Secondary | ICD-10-CM

## 2020-02-13 DIAGNOSIS — Z0000 Encounter for general adult medical examination without abnormal findings: Secondary | ICD-10-CM | POA: Diagnosis not present

## 2020-02-13 DIAGNOSIS — E039 Hypothyroidism, unspecified: Secondary | ICD-10-CM

## 2020-02-13 DIAGNOSIS — I5032 Chronic diastolic (congestive) heart failure: Secondary | ICD-10-CM

## 2020-02-13 DIAGNOSIS — Z9884 Bariatric surgery status: Secondary | ICD-10-CM

## 2020-02-13 DIAGNOSIS — E519 Thiamine deficiency, unspecified: Secondary | ICD-10-CM

## 2020-02-13 DIAGNOSIS — R10815 Periumbilic abdominal tenderness: Secondary | ICD-10-CM

## 2020-02-13 DIAGNOSIS — E1165 Type 2 diabetes mellitus with hyperglycemia: Secondary | ICD-10-CM | POA: Diagnosis not present

## 2020-02-13 DIAGNOSIS — E662 Morbid (severe) obesity with alveolar hypoventilation: Secondary | ICD-10-CM

## 2020-02-13 DIAGNOSIS — E538 Deficiency of other specified B group vitamins: Secondary | ICD-10-CM

## 2020-02-13 DIAGNOSIS — E559 Vitamin D deficiency, unspecified: Secondary | ICD-10-CM

## 2020-02-13 DIAGNOSIS — Z79891 Long term (current) use of opiate analgesic: Secondary | ICD-10-CM

## 2020-02-13 DIAGNOSIS — K439 Ventral hernia without obstruction or gangrene: Secondary | ICD-10-CM

## 2020-02-13 DIAGNOSIS — N400 Enlarged prostate without lower urinary tract symptoms: Secondary | ICD-10-CM

## 2020-02-13 DIAGNOSIS — E66813 Obesity, class 3: Secondary | ICD-10-CM

## 2020-02-13 DIAGNOSIS — E785 Hyperlipidemia, unspecified: Secondary | ICD-10-CM

## 2020-02-13 DIAGNOSIS — I10 Essential (primary) hypertension: Secondary | ICD-10-CM

## 2020-02-13 DIAGNOSIS — D508 Other iron deficiency anemias: Secondary | ICD-10-CM

## 2020-02-13 LAB — LIPID PANEL
Cholesterol: 174 mg/dL (ref 0–200)
HDL: 32.5 mg/dL — ABNORMAL LOW (ref >39.00)
LDL Cholesterol: 122 mg/dL — ABNORMAL HIGH (ref 0–99)
NonHDL: 141.99
Total CHOL/HDL Ratio: 5
Triglycerides: 101 mg/dL (ref 0.0–149.0)
VLDL: 20.2 mg/dL (ref 0.0–40.0)

## 2020-02-13 LAB — CBC WITH DIFFERENTIAL/PLATELET
Basophils Absolute: 0 10*3/uL (ref 0.0–0.1)
Basophils Relative: 0.5 % (ref 0.0–3.0)
Eosinophils Absolute: 0.3 10*3/uL (ref 0.0–0.7)
Eosinophils Relative: 3 % (ref 0.0–5.0)
HCT: 33.1 % — ABNORMAL LOW (ref 39.0–52.0)
Hemoglobin: 10.4 g/dL — ABNORMAL LOW (ref 13.0–17.0)
Lymphocytes Relative: 19.7 % (ref 12.0–46.0)
Lymphs Abs: 1.7 10*3/uL (ref 0.7–4.0)
MCHC: 31.4 g/dL (ref 30.0–36.0)
MCV: 93.2 fl (ref 78.0–100.0)
Monocytes Absolute: 0.5 10*3/uL (ref 0.1–1.0)
Monocytes Relative: 5.7 % (ref 3.0–12.0)
Neutro Abs: 6.1 10*3/uL (ref 1.4–7.7)
Neutrophils Relative %: 71.1 % (ref 43.0–77.0)
Platelets: 276 10*3/uL (ref 150.0–400.0)
RBC: 3.55 Mil/uL — ABNORMAL LOW (ref 4.22–5.81)
RDW: 16.6 % — ABNORMAL HIGH (ref 11.5–15.5)
WBC: 8.6 10*3/uL (ref 4.0–10.5)

## 2020-02-13 LAB — URINALYSIS, ROUTINE W REFLEX MICROSCOPIC
Bilirubin Urine: NEGATIVE
Hgb urine dipstick: NEGATIVE
Ketones, ur: NEGATIVE
Leukocytes,Ua: NEGATIVE
Nitrite: NEGATIVE
RBC / HPF: NONE SEEN (ref 0–?)
Specific Gravity, Urine: 1.015 (ref 1.000–1.030)
Total Protein, Urine: NEGATIVE
Urine Glucose: 100 — AB
Urobilinogen, UA: 0.2 (ref 0.0–1.0)
pH: 5.5 (ref 5.0–8.0)

## 2020-02-13 LAB — VITAMIN B12: Vitamin B-12: 586 pg/mL (ref 211–911)

## 2020-02-13 LAB — PSA: PSA: 1.73 ng/mL (ref 0.10–4.00)

## 2020-02-13 LAB — FOLATE: Folate: 13.4 ng/mL (ref >5.9)

## 2020-02-13 LAB — VITAMIN D 25 HYDROXY (VIT D DEFICIENCY, FRACTURES): VITD: 64.45 ng/mL (ref 30.00–100.00)

## 2020-02-13 LAB — LIPASE: Lipase: 8 U/L — ABNORMAL LOW (ref 11.0–59.0)

## 2020-02-13 LAB — FERRITIN: Ferritin: 661.7 ng/mL — ABNORMAL HIGH (ref 22.0–322.0)

## 2020-02-13 LAB — TSH: TSH: 2.59 u[IU]/mL (ref 0.35–4.50)

## 2020-02-13 LAB — AMYLASE: Amylase: 59 U/L (ref 27–131)

## 2020-02-13 LAB — HEMOGLOBIN A1C: Hgb A1c MFr Bld: 7.6 % — ABNORMAL HIGH (ref 4.6–6.5)

## 2020-02-13 NOTE — Progress Notes (Signed)
Subjective:  Patient ID: [name], male    DOB: Jul 22, 1956  Age: 64 y.o. MRN: [id number]  CC: Anemia, Diabetes, Hyperlipidemia, Annual Exam, and Abdominal Pain  This visit occurred during the SARS-CoV-2 public health emergency.  Safety protocols were in place, including screening questions prior to the visit, additional usage of staff PPE, and extensive cleaning of exam room while observing appropriate contact time as indicated for disinfecting solutions.    HPI [name] presents for a CPX.  He complains of a 1 week history of midline abdominal pain.  He describes it as a sharp sensation that radiates to the left lower quadrant.  He also suffers from chronic constipation and urinary straining.  He denies nausea, vomiting, fever, chills, loss of appetite, weight loss, dysuria, hematuria, bright red blood per rectum, or melena.  Outpatient Medications Prior to Visit  Medication Sig Dispense Refill  . ACCU-CHEK AVIVA PLUS test strip USE TO CHECK BLOOD SUGAR THREE TIMES DAILY. 100 strip 12  . Accu-Chek Softclix Lancets lancets CHECK BLOOD SUGAR THREE TIMES DAILY 100 each 5  . albuterol (PROVENTIL HFA;VENTOLIN HFA) 108 (90 Base) MCG/ACT inhaler Inhale 2 puffs into the lungs every 6 (six) hours as needed for wheezing or shortness of breath. 1 Inhaler 1  . aspirin EC 81 MG tablet Take 81 mg by mouth daily.     Marland Kitchen atorvastatin (LIPITOR) 40 MG tablet Take 1 tablet (40 mg total) by mouth daily. 90 tablet 1  . Beta Carotene (VITAMIN A) 25000 UNIT capsule Take 25,000 Units by mouth daily.    . bisoprolol (ZEBETA) 10 MG tablet Take 1 tablet (10 mg total) by mouth every evening. 90 tablet 1  . blood glucose meter kit and supplies KIT Use to check blood sugar three times daily. DX: E11.8 1 each 0  . Cholecalciferol 2000 units TABS Take 1 tablet (2,000 Units total) by mouth daily. 90 tablet 1  . cyanocobalamin 2000 MCG tablet Take 1 tablet (2,000 mcg total) by mouth daily. 90 tablet 1  .  Empagliflozin-metFORMIN HCl (SYNJARDY) 01-999 MG TABS Take 1 tablet by mouth 2 (two) times daily. 180 tablet 1  . FEROSUL 325 (65 Fe) MG tablet TAKE 1 TABLET BY MOUTH 2 TIMES DAILY WITH A MEAL. 180 tablet 1  . furosemide (LASIX) 80 MG tablet Take 1.5 tablets (120 mg total) by mouth every morning AND 1 tablet (80 mg total) at bedtime. 180 tablet 3  . HYDROcodone-acetaminophen (NORCO) 10-325 MG tablet TAKE 1 TABLET BY MOUTH EVERY 6 HOURS AS NEEDED. 60 tablet 0  . Insulin Detemir (LEVEMIR FLEXTOUCH) 100 UNIT/ML Pen Inject 240 Units into the skin daily.    . Insulin Pen Needle (ULTICARE SHORT PEN NEEDLES) 31G X 8 MM MISC USE WITH INSULIN PENS DAILY. 100 each 3  . levothyroxine (SYNTHROID) 50 MCG tablet TAKE 1 TABLET BY MOUTH DAILY BEFORE BREAKFAST. 90 tablet 0  . losartan (COZAAR) 25 MG tablet Take 2 tablets (50 mg total) by mouth daily. 180 tablet 1  . Methylnaltrexone Bromide (RELISTOR) 150 MG TABS Take 3 tablets by mouth daily. 66 tablet 0  . Multiple Vitamins-Minerals (MULTIVITAMIN ADULT PO) Take by mouth.    Marland Kitchen omeprazole (PRILOSEC) 40 MG capsule Take 1 tablet by mouth 30 min before breakfast. 90 capsule 3  . phentermine 37.5 MG capsule TAKE 1 CAPSULE BY MOUTH EVERY MORNING. 30 capsule 0  . potassium chloride SA (KLOR-CON) 20 MEQ tablet Take 1 tablet (20 mEq total) by mouth 2 (two) times daily.  180 tablet 3  . RESTASIS 0.05 % ophthalmic emulsion PLACE 1 DROP INTO BOTH EYES TWICE A DAY 60 each 5  . sildenafil (REVATIO) 20 MG tablet Take 4 tablets (80 mg total) by mouth daily as needed. 60 tablet 5  . thiamine (VITAMIN B-1) 100 MG tablet TAKE 1 TABLET BY MOUTH DAILY. 90 tablet 1  . VENTOLIN HFA 108 (90 Base) MCG/ACT inhaler INHALE 2 PUFFS INTO THE LUNGS EVERY 6 HOURS AS NEEDED FOR WHEEZING OR SHORTNESS OF BREATH. 18 g 2   No facility-administered medications prior to visit.    ROS Review of Systems  Constitutional: Negative.  Negative for appetite change, chills, diaphoresis, fatigue and fever.   HENT: Negative.   Eyes: Negative.   Respiratory: Negative for cough, chest tightness, shortness of breath and wheezing.   Cardiovascular: Positive for leg swelling. Negative for chest pain and palpitations.  Gastrointestinal: Positive for abdominal pain and constipation. Negative for abdominal distention, blood in stool, diarrhea, nausea and vomiting.  Endocrine: Negative.  Negative for heat intolerance, polydipsia, polyphagia and polyuria.  Genitourinary: Positive for difficulty urinating. Negative for dysuria, flank pain, hematuria, penile swelling, scrotal swelling, testicular pain and urgency.  Musculoskeletal: Positive for arthralgias. Negative for back pain and myalgias.  Skin: Negative.   Neurological: Positive for numbness. Negative for dizziness, weakness, light-headedness and headaches.       Numbness in left lower leg.  Hematological: Negative for adenopathy. Does not bruise/bleed easily.  Psychiatric/Behavioral: Negative.     Objective:  BP 130/70 (BP Location: Left Arm, Patient Position: Sitting, Cuff Size: Large)   Pulse 72   Temp 98.1 F (36.7 C) (Oral)   Resp 16   Ht 6' (1.829 m)   Wt (!) 438 lb (198.7 kg)   SpO2 96%   BMI 59.40 kg/m   BP Readings from Last 3 Encounters:  02/13/20 130/70  01/28/20 124/68  12/04/19 128/63    Wt Readings from Last 3 Encounters:  02/13/20 (!) 438 lb (198.7 kg)  10/31/19 (!) 437 lb (198.2 kg)  09/23/19 (!) 438 lb 12.8 oz (199 kg)    Physical Exam Vitals reviewed.  Constitutional:      General: He is not in acute distress.    Appearance: He is obese. He is ill-appearing. He is not toxic-appearing or diaphoretic.     Comments: confined to a scooter  HENT:     Nose: Nose normal.     Mouth/Throat:     Mouth: Mucous membranes are moist.  Eyes:     General: No scleral icterus.    Extraocular Movements: Extraocular movements intact.  Cardiovascular:     Rate and Rhythm: Normal rate and regular rhythm.     Heart sounds: No  murmur. No gallop.   Pulmonary:     Effort: Pulmonary effort is normal.     Breath sounds: No stridor. No wheezing, rhonchi or rales.  Abdominal:     General: Abdomen is protuberant. Bowel sounds are decreased. There is no distension.     Palpations: Abdomen is soft. There is no hepatomegaly, splenomegaly or mass.     Tenderness: There is abdominal tenderness in the periumbilical area and suprapubic area. There is no guarding or rebound.     Hernia: No hernia is present.  Genitourinary:    Comments: He was not able to position himself for a GU or rectal exam. Musculoskeletal:        General: No tenderness. Normal range of motion.     Right lower leg:  Edema (trace) present.     Left lower leg: Edema (trace) present.  Skin:    General: Skin is warm and dry.     Coloration: Skin is not pale.  Neurological:     General: No focal deficit present.     Mental Status: He is alert and oriented to person, place, and time. Mental status is at baseline.  Psychiatric:        Mood and Affect: Mood normal.        Behavior: Behavior normal.        Thought Content: Thought content normal.     Lab Results  Component Value Date   WBC 8.6 02/13/2020   HGB 10.4 (L) 02/13/2020   HCT 33.1 (L) 02/13/2020   PLT 276.0 02/13/2020   GLUCOSE 50 (L) 02/14/2020   CHOL 174 02/13/2020   TRIG 101.0 02/13/2020   HDL 32.50 (L) 02/13/2020   LDLDIRECT 136.8 12/12/2006   LDLCALC 122 (H) 02/13/2020   ALT 23 10/31/2019   AST 21 10/31/2019   NA 142 02/14/2020   K 4.0 02/14/2020   CL 104 02/14/2020   CREATININE 2.04 (H) 02/14/2020   BUN 32 (H) 02/14/2020   CO2 26 02/14/2020   TSH 2.59 02/13/2020   PSA 1.73 02/13/2020   HGBA1C 7.6 (H) 02/13/2020   MICROALBUR 1.0 10/31/2019    CT ABDOMEN PELVIS WO CONTRAST  Result Date: 02/14/2020 CLINICAL DATA:  Lower abdominal pain, left more than right for 3 weeks. History of colon cancer with sigmoid colectomy. History of lap band and umbilical hernia repair. EXAM: CT  ABDOMEN AND PELVIS WITHOUT CONTRAST TECHNIQUE: Multidetector CT imaging of the abdomen and pelvis was performed following the standard protocol without IV contrast. COMPARISON:  CT scan dated 03/03/2014 FINDINGS: Lower chest: No acute abnormality. Hepatobiliary: Liver parenchyma is normal. Single 3.5 cm stone in the gallbladder, unchanged since the prior study. No biliary ductal dilatation. Pancreas: Unremarkable. No pancreatic ductal dilatation or surrounding inflammatory changes. Spleen: Normal in size without focal abnormality. Adrenals/Urinary Tract: Adrenal glands are unremarkable. Kidneys are normal, without renal calculi, focal lesion, or hydronephrosis. Bladder is unremarkable. Stomach/Bowel: There is a new midline abdominal wall hernia containing a portion of the transverse colon. The colon proximal to the hernia is not significantly distended. Gastric lap band in place. Stomach is otherwise normal. Small bowel appears normal. No diverticular disease. Vascular/Lymphatic: No significant vascular findings are present. No enlarged abdominal or pelvic lymph nodes. Reproductive: Prostate is unremarkable. Other: Scarring in the midline of the anterior abdominal wall and subcutaneous fat from prior surgeries. Musculoskeletal: Previous bilateral hip surgery. No acute abnormalities. IMPRESSION: 1. New midline abdominal wall hernia containing a portion of the transverse colon. The colon proximal to the hernia is not significantly distended. 2. Chronic cholelithiasis. Electronically Signed   By: Lorriane Shire M.D.   On: 02/14/2020 13:58    Assessment & Plan:   Mitchell was seen today for anemia, diabetes, hyperlipidemia, annual exam and abdominal pain.  Diagnoses and all orders for this visit:  Uncontrolled type 2 diabetes mellitus with hyperglycemia ([place])- His A1c is at 7.6%.  He has achieved adequate glycemic control. -     Hemoglobin A1c; Future -     Cancel: Microalbumin / creatinine urine ratio; Future  -     HM Diabetes Foot Exam -     Hemoglobin A1c  B12 deficiency- His H&H have improved.  I will monitor his B12 and folate levels. -     CBC with Differential/Platelet;  Future -     Vitamin B12; Future -     Folate; Future -     Ferritin; Future -     Ferritin -     Folate -     Vitamin B12 -     CBC with Differential/Platelet  Other iron deficiency anemia- His H&H have improved.  I will monitor his iron level. -     CBC with Differential/Platelet; Future -     CBC with Differential/Platelet  Vitamin D deficiency- His vitamin D level is normal now. -     VITAMIN D 25 Hydroxy (Vit-D Deficiency, Fractures); Future -     VITAMIN D 25 Hydroxy (Vit-D Deficiency, Fractures)  Thiamine deficiency- His anemia has improved.  I will monitor his thiamine level. -     CBC with Differential/Platelet; Future -     Vitamin B1; Future -     Vitamin B1 -     CBC with Differential/Platelet  Benign prostatic hyperplasia without lower urinary tract symptoms- His PSA is normal which is a reassuring sign that he does not have prostate cancer. -     PSA; Future -     PSA  Preventative health care- Exam completed, labs reviewed, vaccines reviewed and updated, cancer screenings are up-to-date, patient education material was given.  Bariatric surgery status -     CBC with Differential/Platelet; Future -     CBC with Differential/Platelet  Acquired hypothyroidism- His TSH is in the normal range.  He will remain on the current dose of levothyroxine. -     TSH; Future -     TSH  Essential hypertension- His blood pressure is adequately well controlled.  Chronic diastolic CHF (congestive heart failure) ([place])- He does not appear volume overloaded.  Hyperlipidemia with target LDL less than 100- He has achieved his LDL goal is doing well on the statin. -     Lipid panel; Future -     Cancel: Hepatic function panel; Future -     Lipid panel  Class 3 obesity with alveolar hypoventilation, serious  comorbidity, and body mass index (BMI) of 50.0 to 59.9 in adult [facility])- He has lost some weight recently with phentermine.  Long-term current use of opiate analgesic  Periumbilical abdominal tenderness without rebound tenderness- Jis exam is reassuring that there is not an acute abdominal process.  Her labs are also reassuring.  A CT scan with oral contrast shows that he has a new ventral hernia that contains some transverse colon but the colon proximal to that is not dilated so I do not think there is any concern for incarceration or gangrene.  He was advised to report to the ED if he develops any distress related to this.  In the meantime I have ordered an urgent referral for him to see a general surgeon. -     Lipase; Future -     Amylase; Future -     Urinalysis, Routine w reflex microscopic; Future -     Urinalysis, Routine w reflex microscopic -     Amylase -     Lipase -     CT Abdomen Pelvis W Contrast; Future   I am having Foster Winstanley maintain his omeprazole, albuterol, thiamine, Multiple Vitamins-Minerals (MULTIVITAMIN ADULT PO), aspirin EC, cyanocobalamin, Cholecalciferol, blood glucose meter kit and supplies, sildenafil, UltiCare Short Pen Needles, Synjardy, bisoprolol, potassium chloride SA, furosemide, vitamin A, Levemir FlexTouch, Relistor, FeroSul, losartan, levothyroxine, Accu-Chek Softclix Lancets, Accu-Chek Aviva Plus, Ventolin HFA, atorvastatin, phentermine, HYDROcodone-acetaminophen,  and Restasis.  No orders of the defined types were placed in this encounter.  In addition to time spent on CPE, I spent 60 minutes in preparing to see the patient by review of recent labs, imaging and procedures, obtaining and reviewing separately obtained history, communicating with the patient and family or caregiver, ordering medications, tests or procedures, and documenting clinical information in the EHR including the differential Dx, treatment, and any further evaluation and other management  of 1. Uncontrolled type 2 diabetes mellitus with hyperglycemia ([place]) 2. B12 deficiency 3. Other iron deficiency anemia 4. Vitamin D deficiency 5. Thiamine deficiency 6. Benign prostatic hyperplasia without lower urinary tract symptoms 7. Bariatric surgery status 8. Acquired hypothyroidism 9. Essential hypertension 10. Chronic diastolic CHF (congestive heart failure) ([place]) 11. Hyperlipidemia with target LDL less than 100 12. Class 3 obesity with alveolar hypoventilation, serious comorbidity, and body mass index (BMI) of 50.0 to 59.9 in adult ([place]) 13. Periumbilical abdominal tenderness without rebound tenderness 14. Ventral hernia without obstruction or gangrene    Follow-up: Return in about 1 week (around 02/20/2020).  Scarlette Calico, MD

## 2020-02-13 NOTE — Patient Instructions (Signed)
Abdominal Pain, Adult Pain in the abdomen (abdominal pain) can be caused by many things. Often, abdominal pain is not serious and it gets better with no treatment or by being treated at home. However, sometimes abdominal pain is serious. Your health care provider will ask questions about your medical history and do a physical exam to try to determine the cause of your abdominal pain. Follow these instructions at home:  Medicines  Take over-the-counter and prescription medicines only as told by your health care provider.  Do not take a laxative unless told by your health care provider. General instructions  Watch your condition for any changes.  Drink enough fluid to keep your urine pale yellow.  Keep all follow-up visits as told by your health care provider. This is important. Contact a health care provider if:  Your abdominal pain changes or gets worse.  You are not hungry or you lose weight without trying.  You are constipated or have diarrhea for more than 2-3 days.  You have pain when you urinate or have a bowel movement.  Your abdominal pain wakes you up at night.  Your pain gets worse with meals, after eating, or with certain foods.  You are vomiting and cannot keep anything down.  You have a fever.  You have blood in your urine. Get help right away if:  Your pain does not go away as soon as your health care provider told you to expect.  You cannot stop vomiting.  Your pain is only in areas of the abdomen, such as the right side or the left lower portion of the abdomen. Pain on the right side could be caused by appendicitis.  You have bloody or black stools, or stools that look like tar.  You have severe pain, cramping, or bloating in your abdomen.  You have signs of dehydration, such as: ? Dark urine, very little urine, or no urine. ? Cracked lips. ? Dry mouth. ? Sunken eyes. ? Sleepiness. ? Weakness.  You have trouble breathing or chest  pain. Summary  Often, abdominal pain is not serious and it gets better with no treatment or by being treated at home. However, sometimes abdominal pain is serious.  Watch your condition for any changes.  Take over-the-counter and prescription medicines only as told by your health care provider.  Contact a health care provider if your abdominal pain changes or gets worse.  Get help right away if you have severe pain, cramping, or bloating in your abdomen. This information is not intended to replace advice given to you by your health care provider. Make sure you discuss any questions you have with your health care provider. Document Revised: 01/14/2019 Document Reviewed: 01/14/2019 Elsevier Patient Education  2020 [employer] Inc.  

## 2020-02-14 ENCOUNTER — Other Ambulatory Visit: Payer: Self-pay | Admitting: Internal Medicine

## 2020-02-14 ENCOUNTER — Ambulatory Visit (INDEPENDENT_AMBULATORY_CARE_PROVIDER_SITE_OTHER)
Admission: RE | Admit: 2020-02-14 | Discharge: 2020-02-14 | Disposition: A | Discharge status: Home / Self Care | Payer: Medicare Other | Source: Ambulatory Visit | Attending: Internal Medicine | Admitting: Internal Medicine

## 2020-02-14 ENCOUNTER — Telehealth: Payer: Self-pay

## 2020-02-14 ENCOUNTER — Other Ambulatory Visit (INDEPENDENT_AMBULATORY_CARE_PROVIDER_SITE_OTHER): Payer: Medicare Other

## 2020-02-14 DIAGNOSIS — E1121 Type 2 diabetes mellitus with diabetic nephropathy: Secondary | ICD-10-CM | POA: Diagnosis not present

## 2020-02-14 DIAGNOSIS — R10815 Periumbilic abdominal tenderness: Secondary | ICD-10-CM | POA: Diagnosis not present

## 2020-02-14 DIAGNOSIS — C189 Malignant neoplasm of colon, unspecified: Secondary | ICD-10-CM | POA: Diagnosis not present

## 2020-02-14 DIAGNOSIS — E118 Type 2 diabetes mellitus with unspecified complications: Secondary | ICD-10-CM | POA: Diagnosis not present

## 2020-02-14 DIAGNOSIS — K439 Ventral hernia without obstruction or gangrene: Secondary | ICD-10-CM | POA: Diagnosis not present

## 2020-02-14 LAB — BASIC METABOLIC PANEL
BUN: 32 mg/dL — ABNORMAL HIGH (ref 6–23)
CO2: 26 mEq/L (ref 19–32)
Calcium: 9 mg/dL (ref 8.4–10.5)
Chloride: 104 mEq/L (ref 96–112)
Creatinine, Ser: 2.04 mg/dL — ABNORMAL HIGH (ref 0.40–1.50)
GFR: 40.21 mL/min — ABNORMAL LOW (ref >60.00)
Glucose, Bld: 50 mg/dL — ABNORMAL LOW (ref 70–99)
Potassium: 4 mEq/L (ref 3.5–5.1)
Sodium: 142 mEq/L (ref 135–145)

## 2020-02-15 LAB — CEA: CEA: <0.5 ng/mL

## 2020-02-18 ENCOUNTER — Telehealth: Payer: Self-pay | Admitting: Internal Medicine

## 2020-02-18 NOTE — Telephone Encounter (Signed)
New message:   Pt is calling and states that he needs Dr. [name] to call [facility] atg [phone] to talk with a surgeon over there about his hernia. Please advise.

## 2020-02-19 LAB — VITAMIN B1

## 2020-02-19 LAB — EXTRA SPECIMEN

## 2020-02-20 NOTE — Telephone Encounter (Signed)
F/u    The patient calling checking on the referral to [facility] to talk with a surgeon regarding his hernia.     The patient does not want to go with [facility]

## 2020-02-20 NOTE — Telephone Encounter (Signed)
Spoke to pt Had to leave vm for [facility] to call back with referral instructios

## 2020-02-21 NOTE — Telephone Encounter (Signed)
Pt called back and provided me with the fax number to send referral

## 2020-02-21 NOTE — Telephone Encounter (Signed)
Left another vm at [facility] to call back

## 2020-03-03 ENCOUNTER — Other Ambulatory Visit: Payer: Self-pay | Admitting: *Deleted

## 2020-03-03 ENCOUNTER — Telehealth: Payer: Self-pay

## 2020-03-03 DIAGNOSIS — E1369 Other specified diabetes mellitus with other specified complication: Secondary | ICD-10-CM | POA: Diagnosis not present

## 2020-03-03 DIAGNOSIS — K8020 Calculus of gallbladder without cholecystitis without obstruction: Secondary | ICD-10-CM | POA: Diagnosis not present

## 2020-03-03 DIAGNOSIS — Z9884 Bariatric surgery status: Secondary | ICD-10-CM | POA: Diagnosis not present

## 2020-03-03 DIAGNOSIS — E1142 Type 2 diabetes mellitus with diabetic polyneuropathy: Secondary | ICD-10-CM | POA: Diagnosis not present

## 2020-03-03 DIAGNOSIS — G4733 Obstructive sleep apnea (adult) (pediatric): Secondary | ICD-10-CM | POA: Diagnosis not present

## 2020-03-03 DIAGNOSIS — I5032 Chronic diastolic (congestive) heart failure: Secondary | ICD-10-CM | POA: Diagnosis not present

## 2020-03-03 DIAGNOSIS — G8918 Other acute postprocedural pain: Secondary | ICD-10-CM | POA: Diagnosis not present

## 2020-03-03 DIAGNOSIS — K432 Incisional hernia without obstruction or gangrene: Secondary | ICD-10-CM | POA: Diagnosis not present

## 2020-03-03 DIAGNOSIS — M8949 Other hypertrophic osteoarthropathy, multiple sites: Secondary | ICD-10-CM | POA: Diagnosis not present

## 2020-03-03 NOTE — Patient Outreach (Signed)
[name]) Care Management  03/03/2020  [name] 06-06-1956 [id number]   RN Health Coach Monthly Outreach  Referral Date:11/27/2018 Referral Source:MD Referral Screening Reason for Referral:Disease Management Education Insurance:[employer] Medicare   Outreach Attempt:  Outreach attempt #2 to patient for follow up.  Patient answered and stated he was on the way out the door; requesting call back another day and time.   Plan:  RN Health Coach will make another outreach attempt within the month of July per patient's request.   Hubert Azure RN [place] [phone] [email or web address]

## 2020-03-03 NOTE — Telephone Encounter (Signed)
New message    The patient calling   [facility] needs the CT report on a disk.   The patient will explained further when the [place] calls back

## 2020-03-03 NOTE — Telephone Encounter (Signed)
Called pt and informed that we here at the office had no way to put the imaging results or images on a disc. I have directed pt to contact our Medical Records department for further assistance.

## 2020-03-07 ENCOUNTER — Other Ambulatory Visit: Payer: Self-pay | Admitting: Internal Medicine

## 2020-03-07 NOTE — Telephone Encounter (Signed)
Done erx  Please let pt know - this medication is usually not meant to be a long term wt loss treatment  Pt will need to discuss further refills with pcp at next visit

## 2020-03-07 NOTE — Telephone Encounter (Signed)
Completed.

## 2020-03-17 ENCOUNTER — Other Ambulatory Visit: Payer: Self-pay | Admitting: Internal Medicine

## 2020-03-17 DIAGNOSIS — E1165 Type 2 diabetes mellitus with hyperglycemia: Secondary | ICD-10-CM

## 2020-03-17 DIAGNOSIS — E118 Type 2 diabetes mellitus with unspecified complications: Secondary | ICD-10-CM

## 2020-03-17 MED ORDER — SYNJARDY 12.5-1000 MG PO TABS: 1.0000 | ORAL_TABLET | Freq: Two times a day (BID) | ORAL | 1 refills | Status: DC

## 2020-03-19 ENCOUNTER — Telehealth: Payer: Self-pay

## 2020-03-19 DIAGNOSIS — E039 Hypothyroidism, unspecified: Secondary | ICD-10-CM

## 2020-03-19 MED ORDER — ACCU-CHEK AVIVA PLUS VI STRP: ORAL_STRIP | 3 refills | Status: DC

## 2020-03-19 MED ORDER — LEVOTHYROXINE SODIUM 50 MCG PO TABS: 50.0000 ug | ORAL_TABLET | Freq: Every day | ORAL | 0 refills | Status: DC

## 2020-03-19 NOTE — Telephone Encounter (Signed)
[facility] sent fax requesting refill for levothyroxine and Accu Chek Guide. Erx sent as request.

## 2020-03-20 ENCOUNTER — Other Ambulatory Visit: Payer: Self-pay | Admitting: *Deleted

## 2020-03-20 NOTE — Patient Outreach (Signed)
[facility]) Care Management  03/20/2020  [name] [birth year] [id number]   [place] Monthly Outreach  Referral Date:11/27/2018 Referral Source:MD Referral Screening Reason for Referral:Disease Management Education Insurance:[employer] Medicare   Outreach Attempt:  Outreach attempt #3 to patient for follow up. No answer. RN Health Coach left HIPAA compliant voicemail message along with contact information.  Plan:  RN Health Coach will make another outreach attempt within the month of August if no return call back from patient.   [place] [phone] [email or web address]

## 2020-03-23 IMAGING — DX DG NASAL BONES 3+V
3 series · 3 of 3 positions shown · non-contrast
Comparison: None.

CLINICAL DATA: Fall with nasal bone pain, initial encounter

EXAM:
NASAL BONES - 3+ VIEW

[nasal bones (waters) pa]
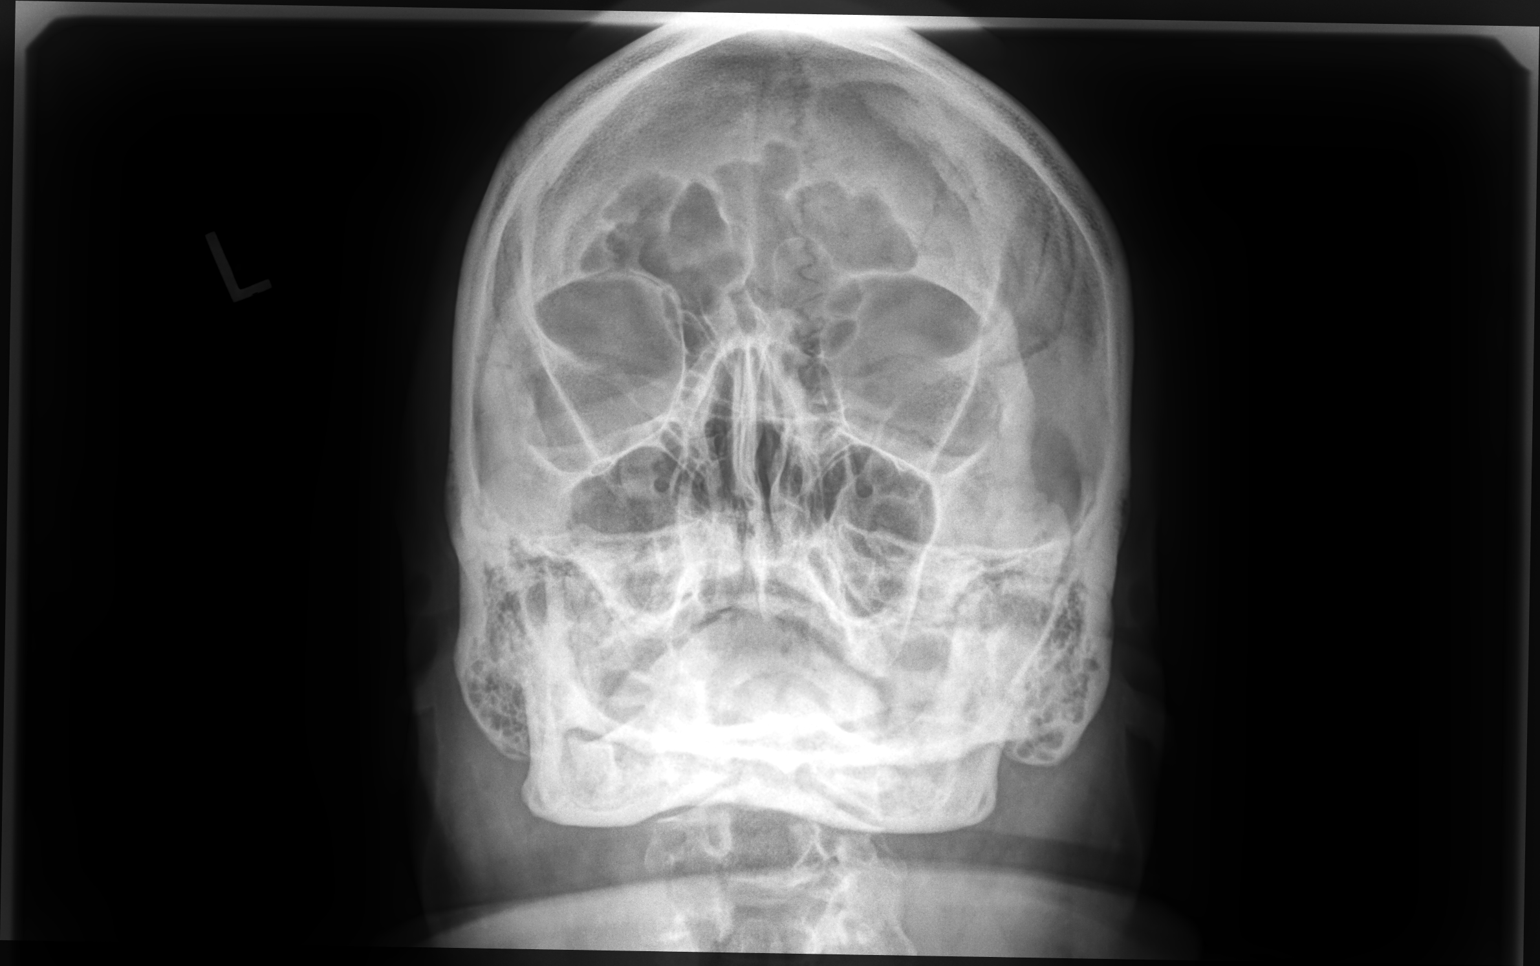

[nasal bones lat (1 of 2)]
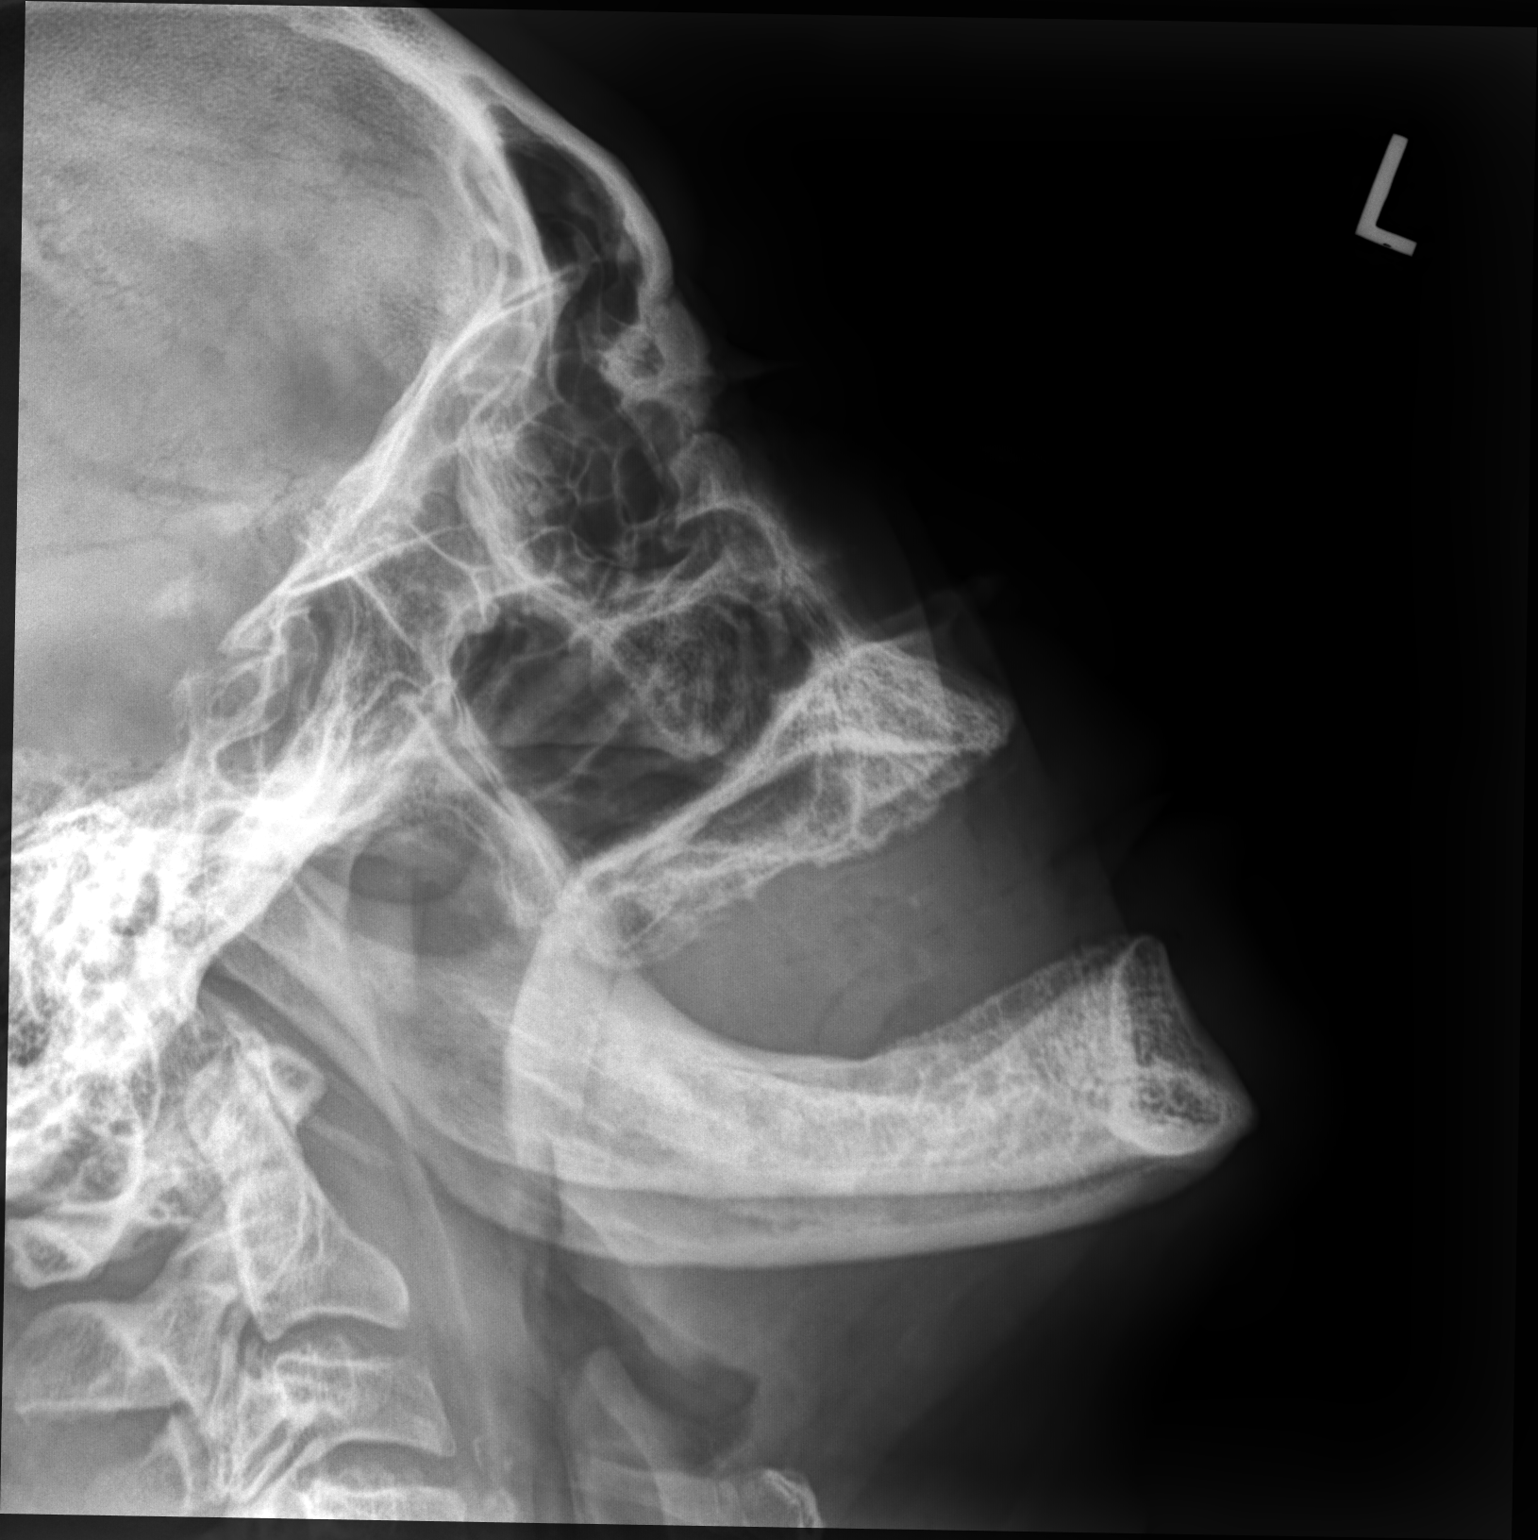

[nasal bones lat (2 of 2)]
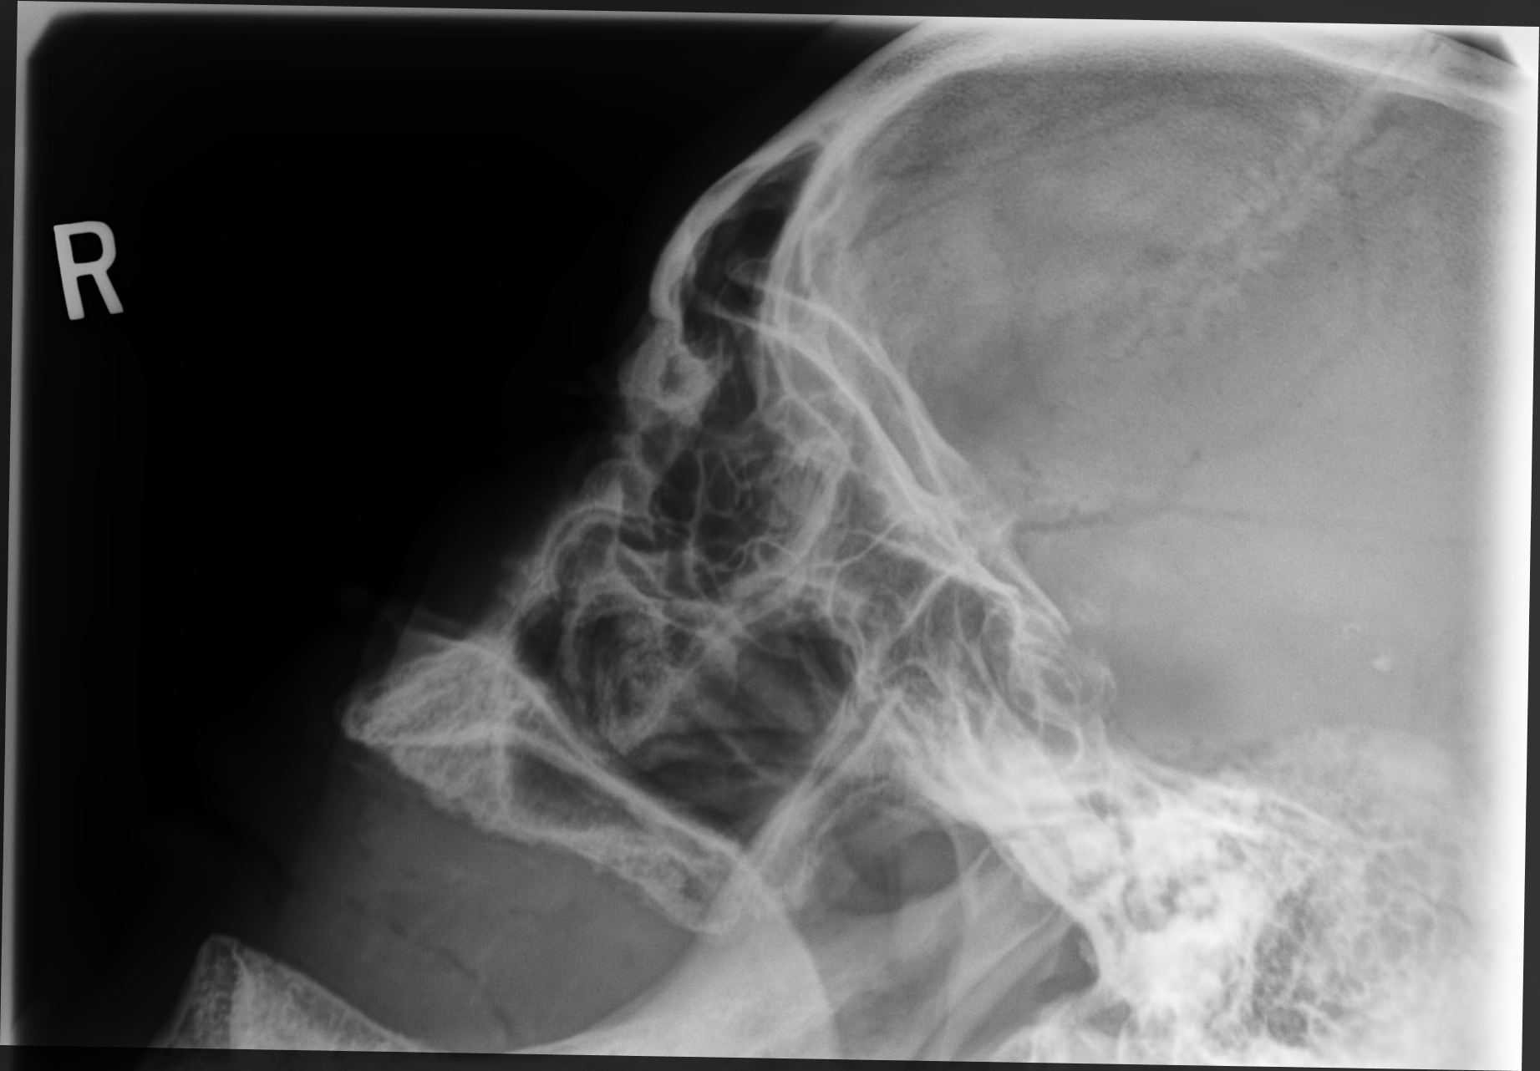

[3 of 3 positions shown; findings below may reference images not displayed]

FINDINGS: There is no evidence of fracture or other bone abnormality.
IMPRESSION: No acute bony abnormality is noted.

## 2020-03-23 IMAGING — DX DG ABDOMEN ACUTE W/ 1V CHEST
4 series · 4 of 4 positions shown · non-contrast
Comparison: 08/29/2019.

CLINICAL DATA: Abdominal pain for 1 week.

EXAM:
DG ABDOMEN ACUTE W/ 1V CHEST

[chest pa]
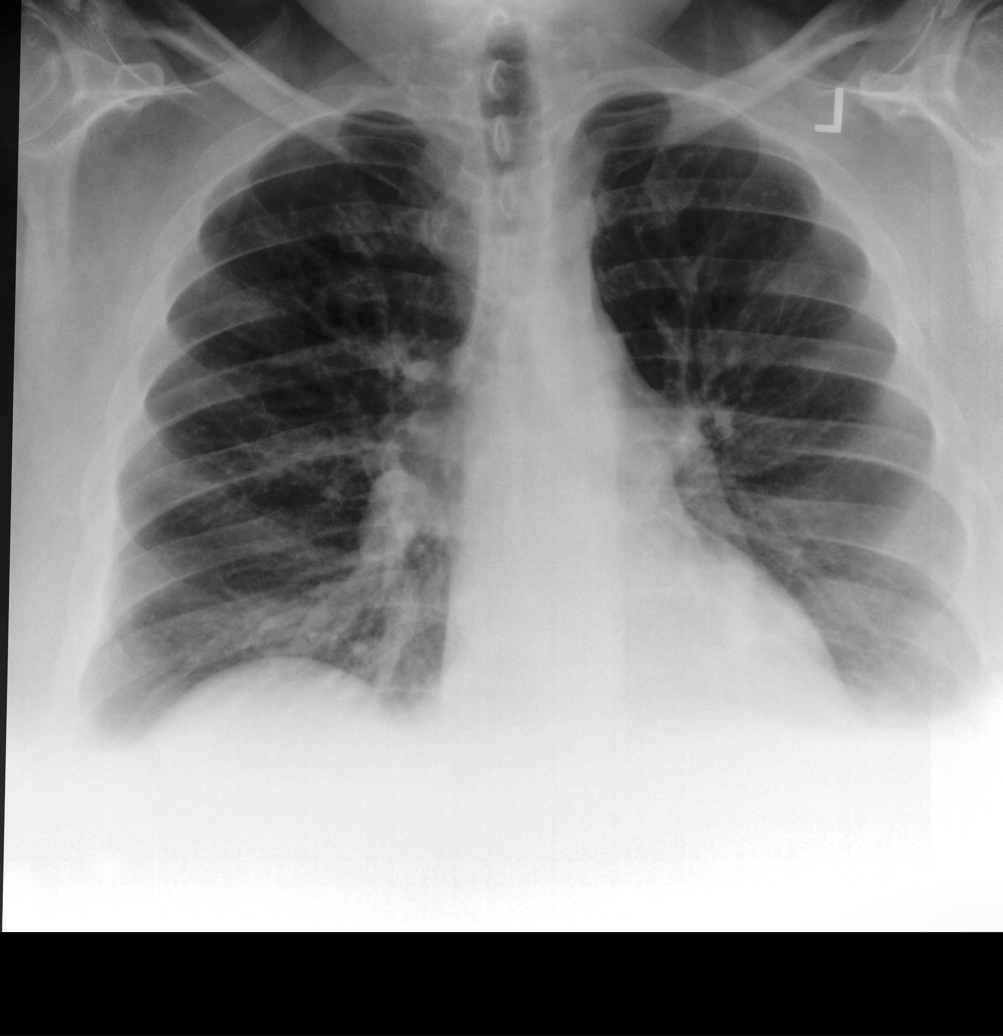

[abdomen standing ap]
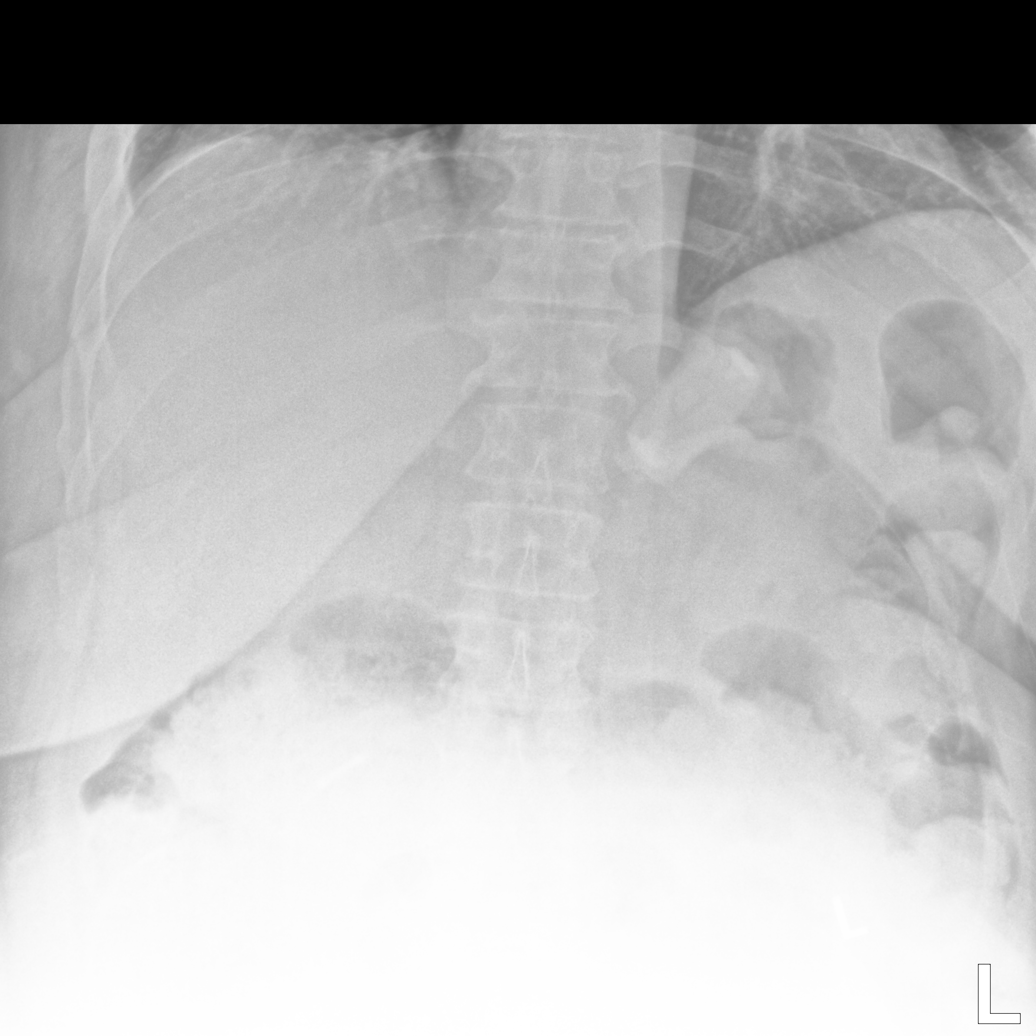

[abdomen supine ap (1 of 2)]
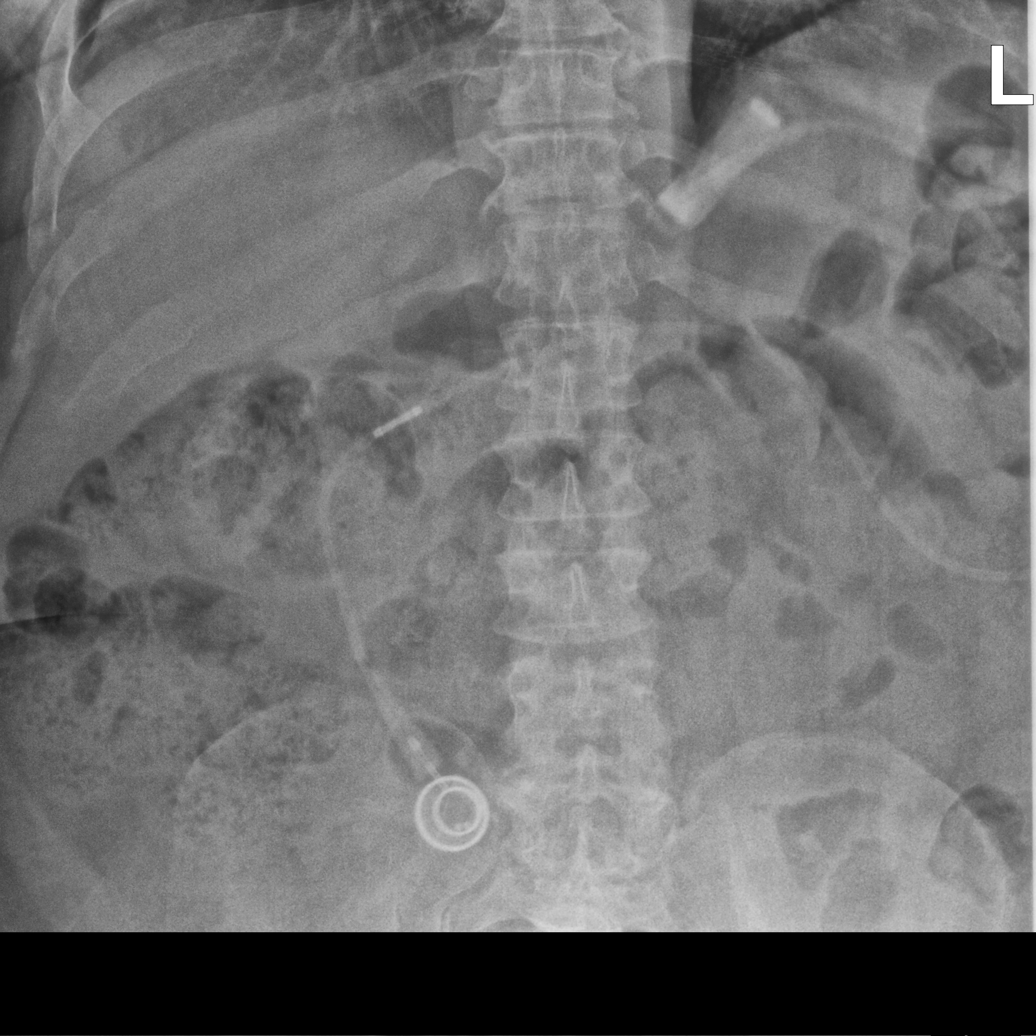

[abdomen supine ap (2 of 2)]
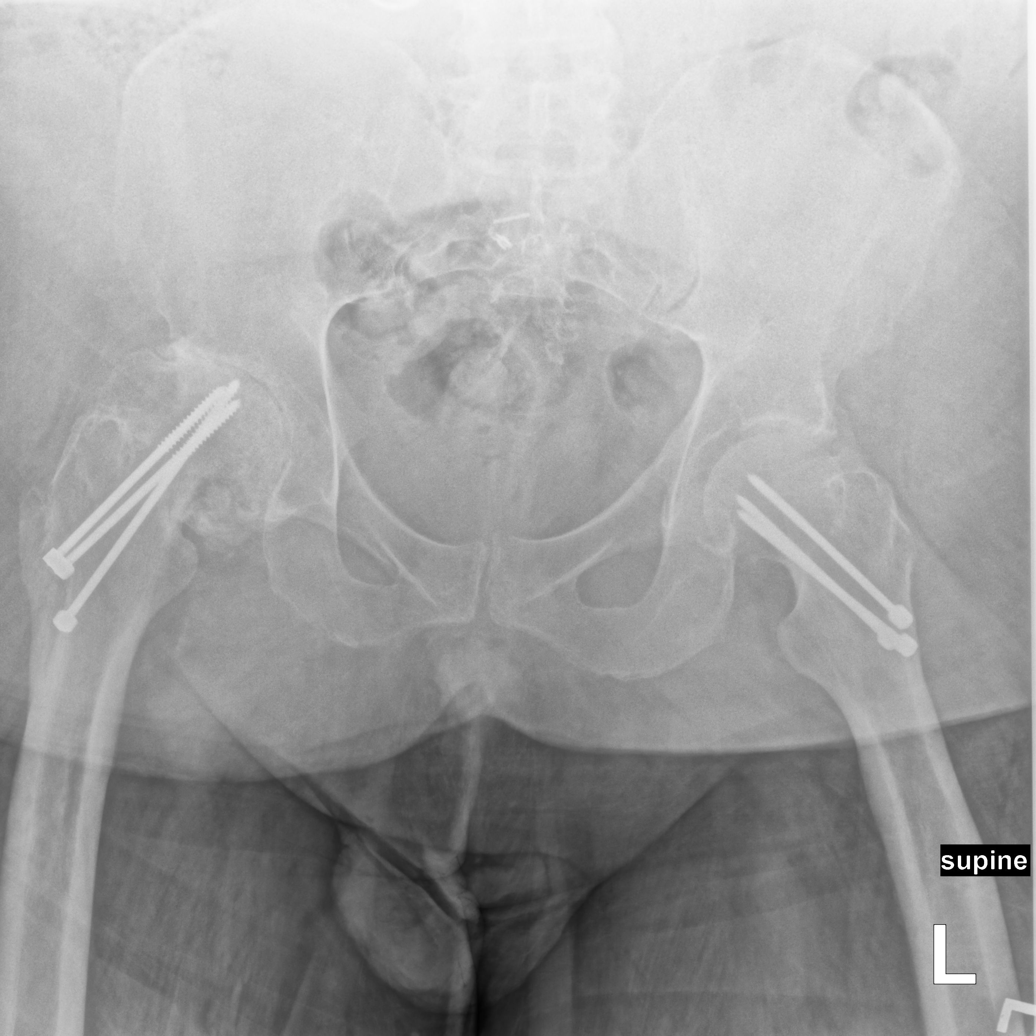

[4 of 4 positions shown; findings below may reference images not displayed]

FINDINGS: Lap band is identified. The position of the lap band is unchanged
from 08/29/2019. There is no evidence of dilated bowel loops or free
intraperitoneal air. No radiopaque calculi or other significant
radiographic abnormality is seen. Heart size and mediastinal
contours are within normal limits. Both lungs are clear.
Incidentally noted are changes from previous screw fixation of
bilateral femoral heads. Severe right hip osteoarthritis.
IMPRESSION: Negative abdominal radiographs. No acute cardiopulmonary disease.

## 2020-03-24 ENCOUNTER — Other Ambulatory Visit: Payer: Self-pay | Admitting: Internal Medicine

## 2020-03-24 DIAGNOSIS — E785 Hyperlipidemia, unspecified: Secondary | ICD-10-CM

## 2020-03-24 DIAGNOSIS — Z6841 Body Mass Index (BMI) 40.0 and over, adult: Secondary | ICD-10-CM

## 2020-03-24 DIAGNOSIS — E118 Type 2 diabetes mellitus with unspecified complications: Secondary | ICD-10-CM

## 2020-03-24 MED ORDER — BLOOD GLUCOSE MONITOR KIT: PACK | 0 refills | Status: DC

## 2020-03-24 MED ORDER — ATORVASTATIN CALCIUM 40 MG PO TABS: 40.0000 mg | ORAL_TABLET | Freq: Every day | ORAL | 1 refills | Status: DC

## 2020-03-24 MED ORDER — ALBUTEROL SULFATE HFA 108 (90 BASE) MCG/ACT IN AERS: 1.0000 | INHALATION_SPRAY | Freq: Four times a day (QID) | RESPIRATORY_TRACT | 1 refills | Status: DC | PRN

## 2020-04-02 ENCOUNTER — Other Ambulatory Visit: Payer: Self-pay | Admitting: Internal Medicine

## 2020-04-15 ENCOUNTER — Other Ambulatory Visit: Payer: Self-pay | Admitting: Internal Medicine

## 2020-04-15 ENCOUNTER — Ambulatory Visit: Payer: Medicare Other | Admitting: Podiatry

## 2020-04-15 ENCOUNTER — Telehealth: Payer: Self-pay

## 2020-04-15 DIAGNOSIS — D509 Iron deficiency anemia, unspecified: Secondary | ICD-10-CM

## 2020-04-15 DIAGNOSIS — E519 Thiamine deficiency, unspecified: Secondary | ICD-10-CM

## 2020-04-15 DIAGNOSIS — D508 Other iron deficiency anemias: Secondary | ICD-10-CM

## 2020-04-15 DIAGNOSIS — E118 Type 2 diabetes mellitus with unspecified complications: Secondary | ICD-10-CM

## 2020-04-15 DIAGNOSIS — Z6841 Body Mass Index (BMI) 40.0 and over, adult: Secondary | ICD-10-CM

## 2020-04-15 DIAGNOSIS — E1022 Type 1 diabetes mellitus with diabetic chronic kidney disease: Secondary | ICD-10-CM

## 2020-04-15 DIAGNOSIS — E785 Hyperlipidemia, unspecified: Secondary | ICD-10-CM

## 2020-04-15 DIAGNOSIS — Z9884 Bariatric surgery status: Secondary | ICD-10-CM

## 2020-04-15 DIAGNOSIS — I5032 Chronic diastolic (congestive) heart failure: Secondary | ICD-10-CM

## 2020-04-15 DIAGNOSIS — D500 Iron deficiency anemia secondary to blood loss (chronic): Secondary | ICD-10-CM

## 2020-04-15 DIAGNOSIS — I10 Essential (primary) hypertension: Secondary | ICD-10-CM

## 2020-04-15 DIAGNOSIS — E538 Deficiency of other specified B group vitamins: Secondary | ICD-10-CM

## 2020-04-15 DIAGNOSIS — E559 Vitamin D deficiency, unspecified: Secondary | ICD-10-CM

## 2020-04-15 DIAGNOSIS — E1165 Type 2 diabetes mellitus with hyperglycemia: Secondary | ICD-10-CM

## 2020-04-15 DIAGNOSIS — N522 Drug-induced erectile dysfunction: Secondary | ICD-10-CM

## 2020-04-15 DIAGNOSIS — E66813 Obesity, class 3: Secondary | ICD-10-CM

## 2020-04-15 DIAGNOSIS — E039 Hypothyroidism, unspecified: Secondary | ICD-10-CM

## 2020-04-15 NOTE — Telephone Encounter (Signed)
Sorry, this medication is not meant to be refilled or used as a long term treatment for wt control as it is not approved for this

## 2020-04-15 NOTE — Telephone Encounter (Signed)
F/u  Calling back with fax # [phone].

## 2020-04-15 NOTE — Telephone Encounter (Signed)
1.Medication Requested:  Accu-Chek Softclix Lancets lancets albuterol (VENTOLIN HFA) 108 (90 Base) MCG/ACT inhaler aspirin EC 81 MG tablet atorvastatin (LIPITOR) 40 MG tablet Beta Carotene (VITAMIN A) 25000 UNIT capsule bisoprolol (ZEBETA) 10 MG tablet blood glucose meter kit and supplies KIT Cholecalciferol 2000 units TABS cyanocobalamin 2000 MCG tablet Empagliflozin-metFORMIN HCl (SYNJARDY) 12.01-999 MG TABS FEROSUL 325 (65 Fe) MG tablet furosemide (LASIX) 80 MG tablet glucose blood (ACCU-CHEK AVIVA PLUS) test strip HYDROcodone-acetaminophen (NORCO) 10-325 MG tablet Insulin Pen Needle (ULTICARE SHORT PEN NEEDLES) 31G X 8 MM MISC LEVEMIR FLEXTOUCH 100 UNIT/ML FlexPen levothyroxine (SYNTHROID) 50 MCG tablet  Methylnaltrexone Bromide (RELISTOR) 150 MG TABS Multiple Vitamins-Minerals (MULTIVITAMIN ADULT PO)  phentermine 37.5 MG capsule potassium chloride SA (KLOR-CON) 20 MEQ tablet RESTASIS 0.05 % ophthalmic emulsion sildenafil (REVATIO) 20 MG tablet thiamine (VITAMIN B-1) 100 MG tablet VENTOLIN HFA 108 (90 Base) MCG/ACT inhaler  2. Pharmacy (Name, Street, [place]): [place] - [phone]  3. On Med List: Yes  4. Last Visit with PCP: 5.27.21   5. Next visit date with PCP: n/a    Agent: Please be advised that RX refills may take up to 3 business days. We ask that you follow-up with your pharmacy.

## 2020-04-17 MED ORDER — FERROUS SULFATE 325 (65 FE) MG PO TABS: 325.0000 mg | ORAL_TABLET | Freq: Two times a day (BID) | ORAL | 1 refills | Status: DC

## 2020-04-17 MED ORDER — LEVOTHYROXINE SODIUM 50 MCG PO TABS: 50.0000 ug | ORAL_TABLET | Freq: Every day | ORAL | 0 refills | Status: DC

## 2020-04-17 MED ORDER — LEVEMIR FLEXTOUCH 100 UNIT/ML ~~LOC~~ SOPN: 200.0000 [IU] | PEN_INJECTOR | Freq: Every day | SUBCUTANEOUS | 1 refills | Status: DC

## 2020-04-17 MED ORDER — ALBUTEROL SULFATE HFA 108 (90 BASE) MCG/ACT IN AERS: 1.0000 | INHALATION_SPRAY | Freq: Four times a day (QID) | RESPIRATORY_TRACT | 1 refills | Status: DC | PRN

## 2020-04-17 MED ORDER — SILDENAFIL CITRATE 20 MG PO TABS: 80.0000 mg | ORAL_TABLET | Freq: Every day | ORAL | 5 refills | Status: DC | PRN

## 2020-04-17 MED ORDER — ULTICARE SHORT PEN NEEDLES 31G X 8 MM MISC: 3 refills | Status: DC

## 2020-04-17 MED ORDER — ACCU-CHEK SOFTCLIX LANCETS MISC: 5 refills | Status: DC

## 2020-04-17 MED ORDER — RESTASIS 0.05 % OP EMUL: 1.0000 [drp] | Freq: Two times a day (BID) | OPHTHALMIC | 1 refills | Status: DC

## 2020-04-17 MED ORDER — POTASSIUM CHLORIDE CRYS ER 20 MEQ PO TBCR: 20.0000 meq | EXTENDED_RELEASE_TABLET | Freq: Two times a day (BID) | ORAL | 3 refills | Status: DC

## 2020-04-17 MED ORDER — FUROSEMIDE 80 MG PO TABS: ORAL_TABLET | ORAL | 3 refills | Status: DC

## 2020-04-17 MED ORDER — VENTOLIN HFA 108 (90 BASE) MCG/ACT IN AERS: 2.0000 | INHALATION_SPRAY | Freq: Four times a day (QID) | RESPIRATORY_TRACT | 2 refills | Status: DC | PRN

## 2020-04-17 MED ORDER — VITAMIN A-BETA CAROTENE 25000 UNITS PO CAPS: 25000.0000 [IU] | ORAL_CAPSULE | Freq: Every day | ORAL | 1 refills | Status: DC

## 2020-04-17 MED ORDER — ACCU-CHEK AVIVA PLUS VI STRP: ORAL_STRIP | 3 refills | Status: DC

## 2020-04-17 MED ORDER — ATORVASTATIN CALCIUM 40 MG PO TABS: 40.0000 mg | ORAL_TABLET | Freq: Every day | ORAL | 1 refills | Status: DC

## 2020-04-17 MED ORDER — ASPIRIN EC 81 MG PO TBEC: 81.0000 mg | DELAYED_RELEASE_TABLET | Freq: Every day | ORAL | 1 refills | Status: AC

## 2020-04-17 MED ORDER — CHOLECALCIFEROL 50 MCG (2000 UT) PO TABS: 1.0000 | ORAL_TABLET | Freq: Every day | ORAL | 1 refills | Status: DC

## 2020-04-17 MED ORDER — BISOPROLOL FUMARATE 10 MG PO TABS: 10.0000 mg | ORAL_TABLET | Freq: Every evening | ORAL | 1 refills | Status: DC

## 2020-04-17 MED ORDER — SYNJARDY 12.5-1000 MG PO TABS: 1.0000 | ORAL_TABLET | Freq: Two times a day (BID) | ORAL | 1 refills | Status: DC

## 2020-04-17 MED ORDER — VITAMIN B-1 100 MG PO TABS: 100.0000 mg | ORAL_TABLET | Freq: Every day | ORAL | 1 refills | Status: DC

## 2020-04-17 MED ORDER — CYANOCOBALAMIN 2000 MCG PO TABS: 2000.0000 ug | ORAL_TABLET | Freq: Every day | ORAL | 1 refills | Status: DC

## 2020-04-17 NOTE — Telephone Encounter (Signed)
Erx has been sent to [facility] per pt request.   Phentermine was rx'ed by Dr. Jenny Reichmann by mistake. This medication has been deleted. Pt will have to have an appointment to have this medication refilled.

## 2020-04-20 ENCOUNTER — Other Ambulatory Visit: Payer: Self-pay | Admitting: Internal Medicine

## 2020-04-23 ENCOUNTER — Telehealth: Payer: Self-pay

## 2020-04-23 ENCOUNTER — Telehealth: Payer: Self-pay | Admitting: Internal Medicine

## 2020-04-23 NOTE — Telephone Encounter (Signed)
New message:   1.Medication Requested: Phentermine 2. Pharmacy (Name, Street, [place]): [place], [place] 3. On Med List: No  4. Last Visit with PCP:   5. Next visit date with PCP:   Agent: Please be advised that RX refills may take up to 3 business days. We ask that you follow-up with your pharmacy.

## 2020-04-23 NOTE — Telephone Encounter (Signed)
Phentermine can not be refilled without an OV.   Pt contacted and schedule visit for Monday.

## 2020-04-23 NOTE — Telephone Encounter (Signed)
New message    [facility] calling has questions on potassium chloride SA (KLOR-CON) 20 MEQ tablet. In available in a wax metric form  Or crystal particle form which preference

## 2020-04-24 MED ORDER — ALBUTEROL SULFATE HFA 108 (90 BASE) MCG/ACT IN AERS: 2.0000 | INHALATION_SPRAY | Freq: Four times a day (QID) | RESPIRATORY_TRACT | 3 refills | Status: DC | PRN

## 2020-04-24 NOTE — Telephone Encounter (Signed)
Called [facility] back and informed okay to dispense the potassium generic for [place] Con.   Also informed to send Pro Air vs. Ventolin. Verbal okay to dispense preferred by insurance.

## 2020-04-27 ENCOUNTER — Other Ambulatory Visit: Payer: Self-pay

## 2020-04-27 ENCOUNTER — Ambulatory Visit (INDEPENDENT_AMBULATORY_CARE_PROVIDER_SITE_OTHER): Payer: Medicare Other | Admitting: Internal Medicine

## 2020-04-27 ENCOUNTER — Encounter: Payer: Self-pay | Admitting: Internal Medicine

## 2020-04-27 VITALS — BP 118/62 | HR 78 | Temp 98.9°F | Resp 16 | Ht 72.0 in | Wt >= 6400 oz

## 2020-04-27 DIAGNOSIS — Z23 Encounter for immunization: Secondary | ICD-10-CM | POA: Diagnosis not present

## 2020-04-27 DIAGNOSIS — I10 Essential (primary) hypertension: Secondary | ICD-10-CM

## 2020-04-27 MED ORDER — PHENTERMINE HCL 37.5 MG PO CAPS: 37.5000 mg | ORAL_CAPSULE | ORAL | 2 refills | Status: DC

## 2020-04-27 NOTE — Progress Notes (Signed)
Subjective:  Patient ID: [name], male    DOB: May 27, 1956  Age: 64 y.o. MRN: [id number]  CC: Abdominal Pain  This visit occurred during the SARS-CoV-2 public health emergency.  Safety protocols were in place, including screening questions prior to the visit, additional usage of staff PPE, and extensive cleaning of exam room while observing appropriate contact time as indicated for disinfecting solutions.    HPI [name] presents for f/up - He tells me that he was recently seen at [facility] for consideration of repair of an abdominal wall hernia.  He has to weigh 290 pounds to undergo surgery.  He wants to take phentermine to help him lose weight.  He has had no recent episodes of abdominal pain, nausea, or vomiting.  He has mild intermittent constipation that responds well to his current treatment options.  Outpatient Medications Prior to Visit  Medication Sig Dispense Refill  . Accu-Chek Softclix Lancets lancets CHECK BLOOD SUGAR THREE TIMES DAILY 100 each 5  . albuterol (VENTOLIN HFA) 108 (90 Base) MCG/ACT inhaler Inhale 2 puffs into the lungs every 6 (six) hours as needed for wheezing or shortness of breath. 18 g 3  . aspirin EC 81 MG tablet Take 1 tablet (81 mg total) by mouth daily. 90 tablet 1  . atorvastatin (LIPITOR) 40 MG tablet Take 1 tablet (40 mg total) by mouth daily. 90 tablet 1  . Beta Carotene (VITAMIN A) 25000 UNIT capsule Take 1 capsule (25,000 Units total) by mouth daily. 90 capsule 1  . bisoprolol (ZEBETA) 10 MG tablet Take 1 tablet (10 mg total) by mouth every evening. 90 tablet 1  . blood glucose meter kit and supplies KIT Use to check blood sugar three times daily. DX: E11.8 2 each 0  . Cholecalciferol 50 MCG (2000 UT) TABS Take 1 tablet (2,000 Units total) by mouth daily. 90 tablet 1  . cyanocobalamin 2000 MCG tablet Take 1 tablet (2,000 mcg total) by mouth daily. 90 tablet 1  . cycloSPORINE (RESTASIS) 0.05 % ophthalmic emulsion Place 1 drop into both  eyes 2 (two) times daily. 22 each 1  . Empagliflozin-metFORMIN HCl (SYNJARDY) 12.01-999 MG TABS Take 1 tablet by mouth 2 (two) times daily. 180 tablet 1  . ferrous sulfate (FEROSUL) 325 (65 FE) MG tablet Take 1 tablet (325 mg total) by mouth 2 (two) times daily with a meal. 180 tablet 1  . furosemide (LASIX) 80 MG tablet Take 1.5 tablets (120 mg total) by mouth every morning AND 1 tablet (80 mg total) at bedtime. 180 tablet 3  . glucose blood (ACCU-CHEK AVIVA PLUS) test strip Use to test blood sugar twice daily. DX E11.8 200 strip 3  . HYDROcodone-acetaminophen (NORCO) 10-325 MG tablet TAKE 1 TABLET BY MOUTH EVERY 6 HOURS AS NEEDED. 60 tablet 0  . insulin detemir (LEVEMIR FLEXTOUCH) 100 UNIT/ML FlexPen Inject 200 Units into the skin daily. 225 mL 1  . Insulin Pen Needle (ULTICARE SHORT PEN NEEDLES) 31G X 8 MM MISC USE WITH INSULIN PENS DAILY. 100 each 3  . levothyroxine (SYNTHROID) 50 MCG tablet Take 1 tablet (50 mcg total) by mouth daily before breakfast. 90 tablet 0  . losartan (COZAAR) 25 MG tablet Take 2 tablets (50 mg total) by mouth daily. 180 tablet 1  . Methylnaltrexone Bromide (RELISTOR) 150 MG TABS Take 3 tablets by mouth daily. 66 tablet 0  . Multiple Vitamins-Minerals (MULTIVITAMIN ADULT PO) Take by mouth.    Marland Kitchen omeprazole (PRILOSEC) 40 MG capsule Take 1 tablet  by mouth 30 min before breakfast. 90 capsule 3  . potassium chloride SA (KLOR-CON) 20 MEQ tablet Take 1 tablet (20 mEq total) by mouth 2 (two) times daily. 180 tablet 3  . sildenafil (REVATIO) 20 MG tablet Take 4 tablets (80 mg total) by mouth daily as needed. 60 tablet 5  . thiamine (VITAMIN B-1) 100 MG tablet Take 1 tablet (100 mg total) by mouth daily. 90 tablet 1   No facility-administered medications prior to visit.    ROS Review of Systems  Constitutional: Negative for chills, diaphoresis, fatigue and fever.  HENT: Negative.   Eyes: Negative for visual disturbance.  Respiratory: Negative for cough, chest tightness,  shortness of breath and wheezing.   Cardiovascular: Negative for chest pain, palpitations and leg swelling.  Gastrointestinal: Positive for constipation. Negative for abdominal pain, blood in stool, diarrhea, nausea and vomiting.  Endocrine: Negative.   Genitourinary: Negative.  Negative for difficulty urinating.  Musculoskeletal: Positive for arthralgias. Negative for back pain and neck pain.  Skin: Negative.     Objective:  BP 118/62 (BP Location: Left Arm, Patient Position: Sitting, Cuff Size: Large)   Pulse 78   Temp 98.9 F (37.2 C) (Oral)   Resp 16   Ht 6' (1.829 m)   Wt (!) 440 lb (199.6 kg)   SpO2 96%   BMI 59.67 kg/m   BP Readings from Last 3 Encounters:  04/27/20 118/62  02/13/20 130/70  01/28/20 124/68    Wt Readings from Last 3 Encounters:  04/27/20 (!) 440 lb (199.6 kg)  02/13/20 (!) 438 lb (198.7 kg)  10/31/19 (!) 437 lb (198.2 kg)    Physical Exam Vitals reviewed.  Constitutional:      General: He is not in acute distress.    Appearance: He is obese. He is ill-appearing (on a scooter). He is not toxic-appearing or diaphoretic.  HENT:     Mouth/Throat:     Mouth: Mucous membranes are moist.  Eyes:     General: No scleral icterus.    Extraocular Movements: Extraocular movements intact.     Conjunctiva/sclera: Conjunctivae normal.  Cardiovascular:     Rate and Rhythm: Normal rate and regular rhythm.     Heart sounds: No murmur heard.   Pulmonary:     Effort: Pulmonary effort is normal.     Breath sounds: No stridor. No wheezing, rhonchi or rales.  Abdominal:     General: Abdomen is protuberant. Bowel sounds are normal. There is no distension.     Palpations: Abdomen is soft. There is no hepatomegaly or mass.     Hernia: A hernia is present. Hernia is present in the ventral area.  Musculoskeletal:        General: Normal range of motion.     Cervical back: Neck supple.     Right lower leg: No edema.     Left lower leg: No edema.  Skin:     General: Skin is warm and dry.  Neurological:     General: No focal deficit present.     Mental Status: Mental status is at baseline.     Lab Results  Component Value Date   WBC 8.6 02/13/2020   HGB 10.4 (L) 02/13/2020   HCT 33.1 (L) 02/13/2020   PLT 276.0 02/13/2020   GLUCOSE 50 (L) 02/14/2020   CHOL 174 02/13/2020   TRIG 101.0 02/13/2020   HDL 32.50 (L) 02/13/2020   LDLDIRECT 136.8 12/12/2006   LDLCALC 122 (H) 02/13/2020   ALT 23 10/31/2019  AST 21 10/31/2019   NA 142 02/14/2020   K 4.0 02/14/2020   CL 104 02/14/2020   CREATININE 2.04 (H) 02/14/2020   BUN 32 (H) 02/14/2020   CO2 26 02/14/2020   TSH 2.59 02/13/2020   PSA 1.73 02/13/2020   HGBA1C 7.6 (H) 02/13/2020   MICROALBUR 1.0 10/31/2019    CT ABDOMEN PELVIS WO CONTRAST  Result Date: 02/14/2020 CLINICAL DATA:  Lower abdominal pain, left more than right for 3 weeks. History of colon cancer with sigmoid colectomy. History of lap band and umbilical hernia repair. EXAM: CT ABDOMEN AND PELVIS WITHOUT CONTRAST TECHNIQUE: Multidetector CT imaging of the abdomen and pelvis was performed following the standard protocol without IV contrast. COMPARISON:  CT scan dated 03/03/2014 FINDINGS: Lower chest: No acute abnormality. Hepatobiliary: Liver parenchyma is normal. Single 3.5 cm stone in the gallbladder, unchanged since the prior study. No biliary ductal dilatation. Pancreas: Unremarkable. No pancreatic ductal dilatation or surrounding inflammatory changes. Spleen: Normal in size without focal abnormality. Adrenals/Urinary Tract: Adrenal glands are unremarkable. Kidneys are normal, without renal calculi, focal lesion, or hydronephrosis. Bladder is unremarkable. Stomach/Bowel: There is a new midline abdominal wall hernia containing a portion of the transverse colon. The colon proximal to the hernia is not significantly distended. Gastric lap band in place. Stomach is otherwise normal. Small bowel appears normal. No diverticular disease.  Vascular/Lymphatic: No significant vascular findings are present. No enlarged abdominal or pelvic lymph nodes. Reproductive: Prostate is unremarkable. Other: Scarring in the midline of the anterior abdominal wall and subcutaneous fat from prior surgeries. Musculoskeletal: Previous bilateral hip surgery. No acute abnormalities. IMPRESSION: 1. New midline abdominal wall hernia containing a portion of the transverse colon. The colon proximal to the hernia is not significantly distended. 2. Chronic cholelithiasis. Electronically Signed   By: Lorriane Shire M.D.   On: 02/14/2020 13:58    Assessment & Plan:   Fread was seen today for abdominal pain.  Diagnoses and all orders for this visit:  Essential hypertension- His blood pressure is well controlled.  Morbid obesity (HCC) -     phentermine 37.5 MG capsule; Take 1 capsule (37.5 mg total) by mouth every morning.  Need for pneumococcal vaccination -     Pneumococcal polysaccharide vaccine 23-valent greater than or equal to 2yo subcutaneous/IM   I am having [name] maintain his omeprazole, Multiple Vitamins-Minerals (MULTIVITAMIN ADULT PO), Relistor, losartan, HYDROcodone-acetaminophen, blood glucose meter kit and supplies, potassium chloride SA, Restasis, sildenafil, thiamine, levothyroxine, Levemir FlexTouch, UltiCare Short Pen Needles, Accu-Chek Aviva Plus, furosemide, ferrous sulfate, Synjardy, cyanocobalamin, Cholecalciferol, bisoprolol, vitamin A, atorvastatin, aspirin EC, Accu-Chek Softclix Lancets, albuterol, and phentermine.  Meds ordered this encounter  Medications  . phentermine 37.5 MG capsule    Sig: Take 1 capsule (37.5 mg total) by mouth every morning.    Dispense:  30 capsule    Refill:  2     Follow-up: Return in about 3 months (around 07/28/2020).  Scarlette Calico, MD

## 2020-04-27 NOTE — Patient Instructions (Signed)

## 2020-04-29 ENCOUNTER — Encounter: Payer: Self-pay | Admitting: *Deleted

## 2020-04-29 ENCOUNTER — Other Ambulatory Visit: Payer: Self-pay | Admitting: *Deleted

## 2020-04-29 ENCOUNTER — Telehealth: Payer: Self-pay | Admitting: Internal Medicine

## 2020-04-29 NOTE — Patient Outreach (Signed)
[facility]) Care Management  [place]  04/29/2020   [name] 1956/06/12 [id number]   RN Health Coach Monthly Outreach   Referral Date:  11/27/2018 Referral Source:  MD Referral Screening Reason for Referral:  Disease Management Education  Insurance:  [employer]    Outreach Attempt:  Successful telephone outreach to patient for follow up.  HIPAA verified with patient.  Patient reporting his frustration with not being able to have hernia surgery due to his weight.  States he had second opinion at [facility] and they would like him to lose weight prior to surgery also.  Reports he has not been monitoring his blood sugars on a regular basis.  Has been checking about twice a week.  Discussed importance of monitoring blood sugars at least daily while on insulin.  Denies any feelings of hypoglycemia.  Encounter Medications:  Outpatient Encounter Medications as of 04/29/2020  Medication Sig  . insulin detemir (LEVEMIR FLEXTOUCH) 100 UNIT/ML FlexPen Inject 200 Units into the skin daily.  . Accu-Chek Softclix Lancets lancets CHECK BLOOD SUGAR THREE TIMES DAILY  . albuterol (VENTOLIN HFA) 108 (90 Base) MCG/ACT inhaler Inhale 2 puffs into the lungs every 6 (six) hours as needed for wheezing or shortness of breath.  Marland Kitchen aspirin EC 81 MG tablet Take 1 tablet (81 mg total) by mouth daily.  Marland Kitchen atorvastatin (LIPITOR) 40 MG tablet Take 1 tablet (40 mg total) by mouth daily.  . Beta Carotene (VITAMIN A) 25000 UNIT capsule Take 1 capsule (25,000 Units total) by mouth daily.  . bisoprolol (ZEBETA) 10 MG tablet Take 1 tablet (10 mg total) by mouth every evening.  . blood glucose meter kit and supplies KIT Use to check blood sugar three times daily. DX: E11.8  . Cholecalciferol 50 MCG (2000 UT) TABS Take 1 tablet (2,000 Units total) by mouth daily.  . cyanocobalamin 2000 MCG tablet Take 1 tablet (2,000 mcg total) by mouth daily.  . cycloSPORINE (RESTASIS) 0.05 %  ophthalmic emulsion Place 1 drop into both eyes 2 (two) times daily.  . Empagliflozin-metFORMIN HCl (SYNJARDY) 12.01-999 MG TABS Take 1 tablet by mouth 2 (two) times daily.  . ferrous sulfate (FEROSUL) 325 (65 FE) MG tablet Take 1 tablet (325 mg total) by mouth 2 (two) times daily with a meal.  . furosemide (LASIX) 80 MG tablet Take 1.5 tablets (120 mg total) by mouth every morning AND 1 tablet (80 mg total) at bedtime.  Marland Kitchen glucose blood (ACCU-CHEK AVIVA PLUS) test strip Use to test blood sugar twice daily. DX E11.8  . HYDROcodone-acetaminophen (NORCO) 10-325 MG tablet TAKE 1 TABLET BY MOUTH EVERY 6 HOURS AS NEEDED.  Marland Kitchen Insulin Pen Needle (ULTICARE SHORT PEN NEEDLES) 31G X 8 MM MISC USE WITH INSULIN PENS DAILY.  Marland Kitchen levothyroxine (SYNTHROID) 50 MCG tablet Take 1 tablet (50 mcg total) by mouth daily before breakfast.  . losartan (COZAAR) 25 MG tablet Take 2 tablets (50 mg total) by mouth daily.  . Methylnaltrexone Bromide (RELISTOR) 150 MG TABS Take 3 tablets by mouth daily.  . Multiple Vitamins-Minerals (MULTIVITAMIN ADULT PO) Take by mouth.  Marland Kitchen omeprazole (PRILOSEC) 40 MG capsule Take 1 tablet by mouth 30 min before breakfast.  . phentermine 37.5 MG capsule Take 1 capsule (37.5 mg total) by mouth every morning.  . potassium chloride SA (KLOR-CON) 20 MEQ tablet Take 1 tablet (20 mEq total) by mouth 2 (two) times daily.  . sildenafil (REVATIO) 20 MG tablet Take 4 tablets (80 mg total) by mouth  daily as needed.  . thiamine (VITAMIN B-1) 100 MG tablet Take 1 tablet (100 mg total) by mouth daily.  . [DISCONTINUED] ferrous sulfate (FEROSUL) 325 (65 FE) MG tablet Take 1 tablet (325 mg total) by mouth 2 (two) times daily with a meal.   No facility-administered encounter medications on file as of 04/29/2020.    Functional Status:  No flowsheet data found.  Fall/Depression Screening: Fall Risk  04/29/2020 12/10/2019 11/07/2019  Falls in the past year? 1 1 1   Comment - - -  Number falls in past yr: 1 1 1     Comment no recent falls no falls recently -  Injury with Fall? 0 0 0  Risk Factor Category  - - -  Comment - - -  Risk for fall due to : History of fall(s);Impaired balance/gait;Impaired mobility;Medication side effect History of fall(s);Medication side effect;Impaired mobility;Impaired balance/gait History of fall(s);Medication side effect;Impaired balance/gait;Impaired mobility  Risk for fall due to: Comment - - -  Follow up Falls prevention discussed;Education provided;Falls evaluation completed Falls prevention discussed;Education provided;Falls evaluation completed Falls prevention discussed;Education provided;Falls evaluation completed   PHQ 2/9 Scores 02/13/2020 12/26/2018 11/27/2018 11/22/2018 10/17/2017 08/08/2016 02/03/2016  PHQ - 2 Score 0 0 0 0 1 0 0  PHQ- 9 Score - - - - 9 - -   Goals Addressed              This Visit's Progress   .  Patient will report decreasing Hgb A1C by 0.2 points within the next 90 days. (pt-stated)        CARE PLAN ENTRY (see longtitudinal plan of care for additional care plan information)  Objective:  Lab Results  Component Value Date   HGBA1C 7.6 (H) 02/13/2020 .   Lab Results  Component Value Date   CREATININE 2.04 (H) 02/14/2020   CREATININE 1.81 (H) 10/31/2019   CREATININE 2.41 (H) 09/23/2019 .   Marland Kitchen No results found for: EGFR  Current Barriers:  Marland Kitchen Knowledge Deficits related to basic Diabetes pathophysiology and self care/management . Knowledge Deficits related to medications used for management of diabetes . Frustrations with self care management of chronic conditions and inability to have hernia surgery  Case Manager Clinical Goal(s):  Over the next 90 days, patient will demonstrate improved adherence to prescribed treatment plan for diabetes self care/management as evidenced by:  decreasing hgb A1C by 0.2 points . Verbalize daily monitoring and recording of CBG within 90 days . Verbalize adherence to ADA/ carb modified diet within the  next 90 days . Verbalize exercise 2 days/week . Verbalize adherence to prescribed medication regimen within the next 90 days   Interventions:  . Provided education to patient about basic DM disease process . Reviewed medications with patient and discussed importance of medication adherence . Discussed plans with patient for ongoing care management follow up and provided patient with direct contact information for care management team . Reviewed scheduled/upcoming provider appointments including: Cardiology appointment in September 2021  Patient Self Care Activities:  . Self administers oral medications as prescribed . Self administers insulin as prescribed . Attends all scheduled provider appointments . Checks blood sugars as prescribed and utilize hyper and hypoglycemia protocol as needed . Adheres to prescribed ADA/carb modified  Initial goal documentation       Appointments:  Attended appointment with primary care provider on 04/27/2020.  Has scheduled appointment with Cardiologist, Dr. Aundra Dubin on 05/29/2020.  Plan: RN Health Coach will send primary care provider quarterly update. RN Health Coach will  make next telephone outreach to patient within the month of October.  [place] [phone] [email or web address]

## 2020-04-29 NOTE — Telephone Encounter (Signed)
COVID Vaccine Updated.

## 2020-04-29 NOTE — Telephone Encounter (Signed)
    Patient calling to report he had Pfizer vaccine on March 27 and  April 17

## 2020-05-18 DIAGNOSIS — H2512 Age-related nuclear cataract, left eye: Secondary | ICD-10-CM | POA: Diagnosis not present

## 2020-05-18 DIAGNOSIS — H26491 Other secondary cataract, right eye: Secondary | ICD-10-CM | POA: Diagnosis not present

## 2020-05-18 DIAGNOSIS — Z947 Corneal transplant status: Secondary | ICD-10-CM | POA: Diagnosis not present

## 2020-05-18 DIAGNOSIS — H18613 Keratoconus, stable, bilateral: Secondary | ICD-10-CM | POA: Diagnosis not present

## 2020-05-18 DIAGNOSIS — T1501XA Foreign body in cornea, right eye, initial encounter: Secondary | ICD-10-CM | POA: Diagnosis not present

## 2020-05-22 ENCOUNTER — Ambulatory Visit: Payer: Medicare Other | Admitting: Podiatry

## 2020-05-29 ENCOUNTER — Ambulatory Visit (HOSPITAL_BASED_OUTPATIENT_CLINIC_OR_DEPARTMENT_OTHER)
Admission: RE | Admit: 2020-05-29 | Discharge: 2020-05-29 | Disposition: A | Discharge status: Home / Self Care | Payer: Medicare Other | Source: Ambulatory Visit | Attending: Cardiology | Admitting: Cardiology

## 2020-05-29 ENCOUNTER — Encounter (HOSPITAL_COMMUNITY): Payer: Self-pay | Admitting: Cardiology

## 2020-05-29 ENCOUNTER — Other Ambulatory Visit: Payer: Self-pay

## 2020-05-29 ENCOUNTER — Ambulatory Visit (HOSPITAL_COMMUNITY)
Admission: RE | Admit: 2020-05-29 | Discharge: 2020-05-29 | Disposition: A | Discharge status: Home / Self Care | Payer: Medicare Other | Source: Ambulatory Visit | Attending: Cardiology | Admitting: Cardiology

## 2020-05-29 VITALS — BP 116/64 | HR 72 | Ht 72.5 in | Wt >= 6400 oz

## 2020-05-29 DIAGNOSIS — Z8249 Family history of ischemic heart disease and other diseases of the circulatory system: Secondary | ICD-10-CM | POA: Insufficient documentation

## 2020-05-29 DIAGNOSIS — N183 Chronic kidney disease, stage 3 unspecified: Secondary | ICD-10-CM | POA: Diagnosis not present

## 2020-05-29 DIAGNOSIS — Z833 Family history of diabetes mellitus: Secondary | ICD-10-CM | POA: Diagnosis not present

## 2020-05-29 DIAGNOSIS — Z794 Long term (current) use of insulin: Secondary | ICD-10-CM | POA: Diagnosis not present

## 2020-05-29 DIAGNOSIS — M79605 Pain in left leg: Secondary | ICD-10-CM | POA: Diagnosis not present

## 2020-05-29 DIAGNOSIS — Z85038 Personal history of other malignant neoplasm of large intestine: Secondary | ICD-10-CM | POA: Insufficient documentation

## 2020-05-29 DIAGNOSIS — E785 Hyperlipidemia, unspecified: Secondary | ICD-10-CM | POA: Diagnosis not present

## 2020-05-29 DIAGNOSIS — Z7982 Long term (current) use of aspirin: Secondary | ICD-10-CM | POA: Insufficient documentation

## 2020-05-29 DIAGNOSIS — E1142 Type 2 diabetes mellitus with diabetic polyneuropathy: Secondary | ICD-10-CM | POA: Diagnosis not present

## 2020-05-29 DIAGNOSIS — E1122 Type 2 diabetes mellitus with diabetic chronic kidney disease: Secondary | ICD-10-CM | POA: Diagnosis not present

## 2020-05-29 DIAGNOSIS — Z9884 Bariatric surgery status: Secondary | ICD-10-CM | POA: Diagnosis not present

## 2020-05-29 DIAGNOSIS — Z6841 Body Mass Index (BMI) 40.0 and over, adult: Secondary | ICD-10-CM | POA: Insufficient documentation

## 2020-05-29 DIAGNOSIS — I130 Hypertensive heart and chronic kidney disease with heart failure and stage 1 through stage 4 chronic kidney disease, or unspecified chronic kidney disease: Secondary | ICD-10-CM | POA: Insufficient documentation

## 2020-05-29 DIAGNOSIS — Z888 Allergy status to other drugs, medicaments and biological substances status: Secondary | ICD-10-CM | POA: Insufficient documentation

## 2020-05-29 DIAGNOSIS — Z79890 Hormone replacement therapy (postmenopausal): Secondary | ICD-10-CM | POA: Diagnosis not present

## 2020-05-29 DIAGNOSIS — G4733 Obstructive sleep apnea (adult) (pediatric): Secondary | ICD-10-CM | POA: Diagnosis not present

## 2020-05-29 DIAGNOSIS — Z9049 Acquired absence of other specified parts of digestive tract: Secondary | ICD-10-CM | POA: Insufficient documentation

## 2020-05-29 DIAGNOSIS — I5032 Chronic diastolic (congestive) heart failure: Secondary | ICD-10-CM | POA: Diagnosis not present

## 2020-05-29 DIAGNOSIS — Z79899 Other long term (current) drug therapy: Secondary | ICD-10-CM | POA: Insufficient documentation

## 2020-05-29 LAB — BASIC METABOLIC PANEL
Anion gap: 11 (ref 5–15)
BUN: 36 mg/dL — ABNORMAL HIGH (ref 8–23)
CO2: 26 mmol/L (ref 22–32)
Calcium: 9 mg/dL (ref 8.9–10.3)
Chloride: 101 mmol/L (ref 98–111)
Creatinine, Ser: 2.24 mg/dL — ABNORMAL HIGH (ref 0.61–1.24)
GFR calc Af Amer: 35 mL/min — ABNORMAL LOW (ref >60)
GFR calc non Af Amer: 30 mL/min — ABNORMAL LOW (ref >60)
Glucose, Bld: 151 mg/dL — ABNORMAL HIGH (ref 70–99)
Potassium: 4.5 mmol/L (ref 3.5–5.1)
Sodium: 138 mmol/L (ref 135–145)

## 2020-05-29 NOTE — Patient Instructions (Signed)
CONTINUE Lasix 120 mg, daily  Labs today We will only contact you if something comes back abnormal or we need to make some changes. Otherwise no news is good news!  You have been referred to [facility]  Address: [address], [place], [place] [address] Phone: [phone] -they will be in contact with you for an appointment   Your physician has requested that you have a lower or upper extremity venous duplex. This test is an ultrasound of the veins in the legs or arms. It looks at venous blood flow that carries blood from the heart to the legs or arms. Allow one hour for a Lower Venous exam. Allow thirty minutes for an Upper Venous exam. There are no restrictions or special instructions.   Your physician recommends that you schedule a follow-up appointment in: 6 months  in the Advanced Practitioners (PA/NP) Clinic   If you have any questions or concerns before your next appointment please send [place] a message through [place] or call our office at [phone].    TO LEAVE A MESSAGE FOR THE NURSE SELECT OPTION 2, PLEASE LEAVE A MESSAGE INCLUDING:  YOUR NAME  DATE OF BIRTH  CALL BACK NUMBER  REASON FOR CALL**this is important as we prioritize the call backs  YOU WILL RECEIVE A CALL BACK THE SAME DAY AS LONG AS YOU CALL BEFORE 4:00 PM

## 2020-05-29 NOTE — Progress Notes (Signed)
Lower extremity venous has been completed.   Preliminary results in CV Proc.   [name] 05/29/2020 10:15 AM

## 2020-05-30 NOTE — Progress Notes (Signed)
ID:  [name], DOB 1956/03/29, MRN [id number]   Provider location: [facility] Type of Visit: Established patient   PCP:  [name], MD  Cardiologist:  Dr. Aundra Dubin   History of Present Illness: [name] is a 64 y.o. male who has history of morbid obesity, diabetes, and diastolic CHF.   He still has limited mobility due to size and uses a rolling walker in the house and a power chair when he goes out. He still gets short of breath when he walks with walker around the house.  This is unchanged.  No chest pain.  No lightheadedness. No palpitations. He plans to swim for exercise, goes to the [facility]. He has had chronic left>right lower leg swelling.  I tried to arrange for venous dopplers after last appointment but he never had the study done. He is taking Lasix 120 mg once daily, no longer taking Lasix in the pm.   He has been evaluated to have lap band out and to get gastric sleeve.  Most recently seen at [facility], told that he needs get his weight < 300 lbs.   Labs (8/10): creatinine 1.1, LDL 94, HDL 34 Labs (8/11): K 4.8, creatinine 1.2, LDL 93, HDL 33 Labs (10/12): K 5.4, creatinine 1.5, LDL 95, HDL 42 Labs (8/14): LDL 115, HDL 42 Labs (9/14): BNP 68 Labs (10/14): K 4.5, creatinine 1.6 Labs (7/15): K 4.4, creatinine 1.3 Labs (8/15): LDL 136, HDL 37 Labs (11/17): K 4.6, creatinine 1.43, LDL 111, HDL 37 Labs (4/18): K 4.9, creatinine 1.35, hgb 10.5 Labs (1/19): creatinine 2.56 Labs (3/20): K 4, creatinine 1.88, hgb 10.2, HIV negative  Labs (7/20): K 4.7, creatinine 2.46 Labs (5/21): K 4, creatinine 2.04, LDL 122  Past Medical History: 1. Diabetes mellitus.  Has history of diabetic foot ulcer and diabetic peripheral neuropathy. .  2. Hyperlipidemia 3. Hypertension: ACEI cough.  4. Colon cancer: s/p sigmoid colectomy in 2007 5. Morbid obesity s/p lap band procedure.  6. Anemia 7. Obstructive Sleep Apnea: Using CPAP.  8. CHF: Diastolic.  Echo  (5/12): EF 50-55%, mild LV dilation, mild LVH, moderate (grade II) diastolic dysfunction.  Echo (9/14) with EF 50-55%, mild LVH, moderately dilated LV, RV poorly visualized. - Echo (6/19): EF 55-60%, RV poorly visualized.  9. PFTs (9/10): mild obstructive defect 10.  Dobutamine stress echo (9/10): EF 55%, mild LV hypertrophy, no significant valvular disease, no ischemia or infarction  11. CKD stage 3 12. Ventral hernia s/p repair.   Current Outpatient Medications  Medication Sig Dispense Refill  . Accu-Chek Softclix Lancets lancets CHECK BLOOD SUGAR THREE TIMES DAILY 100 each 5  . albuterol (VENTOLIN HFA) 108 (90 Base) MCG/ACT inhaler Inhale 2 puffs into the lungs every 6 (six) hours as needed for wheezing or shortness of breath. 18 g 3  . aspirin EC 81 MG tablet Take 1 tablet (81 mg total) by mouth daily. 90 tablet 1  . atorvastatin (LIPITOR) 40 MG tablet Take 1 tablet (40 mg total) by mouth daily. 90 tablet 1  . Beta Carotene (VITAMIN A) 25000 UNIT capsule Take 1 capsule (25,000 Units total) by mouth daily. 90 capsule 1  . bisoprolol (ZEBETA) 10 MG tablet Take 1 tablet (10 mg total) by mouth every evening. 90 tablet 1  . blood glucose meter kit and supplies KIT Use to check blood sugar three times daily. DX: E11.8 2 each 0  . Cholecalciferol 50 MCG (2000 UT) TABS Take 1 tablet (  2,000 Units total) by mouth daily. 90 tablet 1  . cyanocobalamin 2000 MCG tablet Take 1 tablet (2,000 mcg total) by mouth daily. 90 tablet 1  . cycloSPORINE (RESTASIS) 0.05 % ophthalmic emulsion Place 1 drop into both eyes 2 (two) times daily. 22 each 1  . docusate sodium (COLACE) 100 MG capsule Take 100 mg by mouth 2 (two) times daily.    . Empagliflozin-metFORMIN HCl (SYNJARDY) 12.01-999 MG TABS Take 1 tablet by mouth 2 (two) times daily. 180 tablet 1  . ferrous sulfate (FEROSUL) 325 (65 FE) MG tablet Take 1 tablet (325 mg total) by mouth 2 (two) times daily with a meal. 180 tablet 1  . furosemide (LASIX) 80 MG  tablet Take 120 mg by mouth daily.    Marland Kitchen glucose blood (ACCU-CHEK AVIVA PLUS) test strip Use to test blood sugar twice daily. DX E11.8 200 strip 3  . HYDROcodone-acetaminophen (NORCO) 10-325 MG tablet TAKE 1 TABLET BY MOUTH EVERY 6 HOURS AS NEEDED. 60 tablet 0  . insulin detemir (LEVEMIR FLEXTOUCH) 100 UNIT/ML FlexPen Inject 200 Units into the skin daily. 225 mL 1  . Insulin Pen Needle (ULTICARE SHORT PEN NEEDLES) 31G X 8 MM MISC USE WITH INSULIN PENS DAILY. 100 each 3  . levothyroxine (SYNTHROID) 50 MCG tablet Take 1 tablet (50 mcg total) by mouth daily before breakfast. 90 tablet 0  . losartan (COZAAR) 25 MG tablet Take 2 tablets (50 mg total) by mouth daily. 180 tablet 1  . Multiple Vitamins-Minerals (MULTIVITAMIN ADULT PO) Take by mouth.    Marland Kitchen omeprazole (PRILOSEC) 40 MG capsule Take 1 tablet by mouth 30 min before breakfast. 90 capsule 3  . phentermine 37.5 MG capsule Take 1 capsule (37.5 mg total) by mouth every morning. 30 capsule 2  . potassium chloride SA (KLOR-CON) 20 MEQ tablet Take 1 tablet (20 mEq total) by mouth 2 (two) times daily. 180 tablet 3  . thiamine (VITAMIN B-1) 100 MG tablet Take 1 tablet (100 mg total) by mouth daily. 90 tablet 1   No current facility-administered medications for this encounter.    Allergies:   Shellfish allergy, Lisinopril, and Lovastatin   Social History:  The patient  reports that he has never smoked. He has never used smokeless tobacco. He reports previous alcohol use. He reports that he does not use drugs.   Family History:  The patient's family history includes CAD in his mother; Congestive Heart Failure in his mother; Diabetes in his father and mother; Heart attack (age of onset: 54) in his brother; Heart failure in his brother; Hepatitis in his mother.   ROS:  Please see the history of present illness.   All other systems are personally reviewed and negative.   Exam:   BP 116/64   Pulse 72   Ht 6' 0.5" (1.842 m)   Wt (!) 196 kg (432 lb)    SpO2 98%   BMI 57.78 kg/m  General: Morbidly obese.  Neck: Thick, no JVD, no thyromegaly or thyroid nodule.  Lungs: Clear to auscultation bilaterally with normal respiratory effort. CV: Nondisplaced PMI.  Heart regular S1/S2, no S3/S4, no murmur.  1+ edema to knee on left, trace ankle edema on right.  No carotid bruit.  Normal pedal pulses.  Abdomen: Soft, nontender, no hepatosplenomegaly, no distention.  Skin: Intact without lesions or rashes.  Neurologic: Alert and oriented x 3.  Psych: Normal affect. Extremities: No clubbing or cyanosis.  HEENT: Normal.   Recent Labs: 10/31/2019: ALT 23 02/13/2020: Hemoglobin 10.4;  Platelets 276.0; TSH 2.59 05/29/2020: BUN 36; Creatinine, Ser 2.24; Potassium 4.5; Sodium 138  Personally reviewed   Wt Readings from Last 3 Encounters:  05/29/20 (!) 196 kg (432 lb)  04/27/20 (!) 199.6 kg (440 lb)  02/13/20 (!) 198.7 kg (438 lb)     ASSESSMENT AND PLAN:  1. Chronic diastolic CHF:  Chronic NYHA class III symptoms.  He does not seem particularly volume overloaded.  - Continue Lasix 120 mg daily.  BMET today.   - Continue KCl 20 bid.  2. Obesity: He has been evaluated for transitioning from lap band to gastric sleeve, was told that he needs to get his weight < 300.   - I will refer to [facility].  - He is going to start going more regularly to the [facility] to swim.  3. HTN: BP controlled.  4. OSA: Using CPAP.  5. CKD: Stage 3.  If creatinine is higher today, would be reasonable to refer to nephrology.  6. Asymmetric LE edema: Left leg is considerably larger.  He is at risk for DVT as he is very sedentary.   - I am going to arrange for lower extremity venous dopplers today.   Followup in 6 months with NP/PA.   Signed, [name], MD  05/30/2020   [facility] [address] [facility] [place] [address] [phone] (office) [phone] (fax)

## 2020-06-01 ENCOUNTER — Other Ambulatory Visit: Payer: Self-pay | Admitting: Internal Medicine

## 2020-06-01 ENCOUNTER — Telehealth: Payer: Self-pay | Admitting: Internal Medicine

## 2020-06-01 DIAGNOSIS — Z1211 Encounter for screening for malignant neoplasm of colon: Secondary | ICD-10-CM | POA: Insufficient documentation

## 2020-06-01 NOTE — Telephone Encounter (Signed)
    Patient calling to request a referral to get a colonoscopy.

## 2020-06-10 ENCOUNTER — Other Ambulatory Visit (HOSPITAL_COMMUNITY): Payer: Self-pay | Admitting: Cardiology

## 2020-06-10 DIAGNOSIS — E118 Type 2 diabetes mellitus with unspecified complications: Secondary | ICD-10-CM

## 2020-06-10 DIAGNOSIS — I10 Essential (primary) hypertension: Secondary | ICD-10-CM

## 2020-06-10 DIAGNOSIS — I5032 Chronic diastolic (congestive) heart failure: Secondary | ICD-10-CM

## 2020-06-10 DIAGNOSIS — E1022 Type 1 diabetes mellitus with diabetic chronic kidney disease: Secondary | ICD-10-CM

## 2020-06-11 ENCOUNTER — Other Ambulatory Visit: Payer: Self-pay | Admitting: Internal Medicine

## 2020-06-11 DIAGNOSIS — M5416 Radiculopathy, lumbar region: Secondary | ICD-10-CM

## 2020-06-11 DIAGNOSIS — M159 Polyosteoarthritis, unspecified: Secondary | ICD-10-CM

## 2020-06-11 DIAGNOSIS — E1142 Type 2 diabetes mellitus with diabetic polyneuropathy: Secondary | ICD-10-CM

## 2020-06-11 DIAGNOSIS — M150 Primary generalized (osteo)arthritis: Secondary | ICD-10-CM

## 2020-06-11 NOTE — Telephone Encounter (Signed)
Done erx  Further refills should be referred to PCP

## 2020-06-12 ENCOUNTER — Encounter: Payer: Self-pay | Admitting: Podiatry

## 2020-06-12 ENCOUNTER — Other Ambulatory Visit: Payer: Self-pay

## 2020-06-12 ENCOUNTER — Ambulatory Visit (INDEPENDENT_AMBULATORY_CARE_PROVIDER_SITE_OTHER): Payer: Medicare Other | Admitting: Podiatry

## 2020-06-12 DIAGNOSIS — M7662 Achilles tendinitis, left leg: Secondary | ICD-10-CM

## 2020-06-12 DIAGNOSIS — M79674 Pain in right toe(s): Secondary | ICD-10-CM

## 2020-06-12 DIAGNOSIS — M79675 Pain in left toe(s): Secondary | ICD-10-CM | POA: Diagnosis not present

## 2020-06-12 DIAGNOSIS — E1142 Type 2 diabetes mellitus with diabetic polyneuropathy: Secondary | ICD-10-CM

## 2020-06-12 DIAGNOSIS — B351 Tinea unguium: Secondary | ICD-10-CM

## 2020-06-12 MED ORDER — CICLOPIROX 8 % EX SOLN: Freq: Every day | CUTANEOUS | 0 refills | Status: DC

## 2020-06-12 NOTE — Addendum Note (Signed)
Encounter addended by: Harvie Junior, CMA on: 06/12/2020 9:43 AM  Actions taken: Charge Capture section accepted

## 2020-06-12 NOTE — Progress Notes (Signed)
Subjective:  Patient ID: [name], male    DOB: [birth year],  MRN: [id number]  Chief Complaint  Patient presents with  . Follow-up    fungual//nail trim    64 y.o. male presents with the above complaint.  Patient presents with complaint of thickened elongated dystrophic toenails x10.  Patient would like to have them debrided down as he is unable to do it because he is in a wheelchair.  The pain to touch.  He also has secondary complaint of left ankle posterior pain at the insertion of the Achilles.  Patient states is been hurting mildly.  He would like to know if there is any conservative treatment options such as brace to help with it.  He denies any other acute complaints.   Review of Systems: Negative except as noted in the HPI. Denies N/V/F/Ch.  Past Medical History:  Diagnosis Date  . Anemia    iron  . CHF (congestive heart failure) (HCC)    Presumed diastolic. Echo (06/07) w.EF 45%, severe posterior HK, mild LV hypertrophy, No further work-up of abnormal echo was done. pt denies CHF  . Colon cancer ([place]) 2007   s/p sigmoid colectomy  . Diabetes mellitus    Has Hx of diabetic foot ulcer & peripheral neuropathy  type2  . Dyspnea    w/ activity  . History of PFTs 05/2009   Mild Obstructive defect  . HTN (hypertension)   . Hyperlipidemia   . Morbid obesity ([place])   . OSA on CPAP   . Pneumonia    week ago     Current Outpatient Medications:  .  Accu-Chek Softclix Lancets lancets, CHECK BLOOD SUGAR THREE TIMES DAILY, Disp: 100 each, Rfl: 5 .  albuterol (VENTOLIN HFA) 108 (90 Base) MCG/ACT inhaler, Inhale 2 puffs into the lungs every 6 (six) hours as needed for wheezing or shortness of breath., Disp: 18 g, Rfl: 3 .  aspirin EC 81 MG tablet, Take 1 tablet (81 mg total) by mouth daily., Disp: 90 tablet, Rfl: 1 .  atorvastatin (LIPITOR) 40 MG tablet, Take 1 tablet (40 mg total) by mouth daily., Disp: 90 tablet, Rfl: 1 .  Beta Carotene (VITAMIN A) 25000 UNIT capsule, Take 1  capsule (25,000 Units total) by mouth daily., Disp: 90 capsule, Rfl: 1 .  bisoprolol (ZEBETA) 10 MG tablet, Take 1 tablet (10 mg total) by mouth every evening., Disp: 90 tablet, Rfl: 1 .  blood glucose meter kit and supplies KIT, Use to check blood sugar three times daily. DX: E11.8, Disp: 2 each, Rfl: 0 .  Cholecalciferol 50 MCG (2000 UT) TABS, Take 1 tablet (2,000 Units total) by mouth daily., Disp: 90 tablet, Rfl: 1 .  ciclopirox (PENLAC) 8 % solution, Apply topically at bedtime. Apply over nail and surrounding skin. Apply daily over previous coat. After seven (7) days, may remove with alcohol and continue cycle., Disp: 6.6 mL, Rfl: 0 .  cyanocobalamin 2000 MCG tablet, Take 1 tablet (2,000 mcg total) by mouth daily., Disp: 90 tablet, Rfl: 1 .  cycloSPORINE (RESTASIS) 0.05 % ophthalmic emulsion, Place 1 drop into both eyes 2 (two) times daily., Disp: 22 each, Rfl: 1 .  docusate sodium (COLACE) 100 MG capsule, Take 100 mg by mouth 2 (two) times daily., Disp: , Rfl:  .  Empagliflozin-metFORMIN HCl (SYNJARDY) 12.01-999 MG TABS, Take 1 tablet by mouth 2 (two) times daily., Disp: 180 tablet, Rfl: 1 .  ferrous sulfate (FEROSUL) 325 (65 FE) MG tablet, Take 1 tablet (325 mg total)  by mouth 2 (two) times daily with a meal., Disp: 180 tablet, Rfl: 1 .  furosemide (LASIX) 80 MG tablet, Take 120 mg by mouth daily., Disp: , Rfl:  .  glucose blood (ACCU-CHEK AVIVA PLUS) test strip, Use to test blood sugar twice daily. DX E11.8, Disp: 200 strip, Rfl: 3 .  HYDROcodone-acetaminophen (NORCO) 10-325 MG tablet, TAKE 1 TABLET BY MOUTH EVERY 6 HOURS AS NEEDED., Disp: 60 tablet, Rfl: 0 .  insulin detemir (LEVEMIR FLEXTOUCH) 100 UNIT/ML FlexPen, Inject 200 Units into the skin daily., Disp: 225 mL, Rfl: 1 .  Insulin Pen Needle (ULTICARE SHORT PEN NEEDLES) 31G X 8 MM MISC, USE WITH INSULIN PENS DAILY., Disp: 100 each, Rfl: 3 .  levothyroxine (SYNTHROID) 50 MCG tablet, Take 1 tablet (50 mcg total) by mouth daily before  breakfast., Disp: 90 tablet, Rfl: 0 .  losartan (COZAAR) 25 MG tablet, TAKE 2 TABLETS BY MOUTH DAILY., Disp: 60 tablet, Rfl: 2 .  Multiple Vitamins-Minerals (MULTIVITAMIN ADULT PO), Take by mouth., Disp: , Rfl:  .  omeprazole (PRILOSEC) 40 MG capsule, Take 1 tablet by mouth 30 min before breakfast., Disp: 90 capsule, Rfl: 3 .  phentermine 37.5 MG capsule, Take 1 capsule (37.5 mg total) by mouth every morning., Disp: 30 capsule, Rfl: 2 .  potassium chloride SA (KLOR-CON) 20 MEQ tablet, Take 1 tablet (20 mEq total) by mouth 2 (two) times daily., Disp: 180 tablet, Rfl: 3 .  thiamine (VITAMIN B-1) 100 MG tablet, Take 1 tablet (100 mg total) by mouth daily., Disp: 90 tablet, Rfl: 1  Social History   Tobacco Use  Smoking Status Never Smoker  Smokeless Tobacco Never Used    Allergies  Allergen Reactions  . Shellfish Allergy Anaphylaxis  . Lisinopril Cough  . Lovastatin Itching   Objective:  There were no vitals filed for this visit. There is no height or weight on file to calculate BMI. Constitutional Well developed. Well nourished.  Vascular Dorsalis pedis pulses palpable bilaterally. Posterior tibial pulses palpable bilaterally. Capillary refill normal to all digits.  No cyanosis or clubbing noted. Pedal hair growth normal.  Neurologic Normal speech. Oriented to person, place, and time. Epicritic sensation to light touch grossly present bilaterally.  Dermatologic  thickened elongated dystrophic mycotic toenails x10.  Mild pain on palpation. No open wounds. No skin lesions.  Orthopedic:  Mild pain on palpation to the left Achilles insertion.  Pain with dorsiflexion of the ankle joint no pain with plantarflexion of the ankle joint.  No intra-articular deep ankle pain within the ankle joint..   Radiographs: 3 views of skeletally mature adult right foot: Exostosis noted at the hallux IPJ with severe arthritis of the interphalangeal joint of the hallux.  No loose osteophytes  noted. Assessment:   1. Achilles tendinitis of left lower extremity   2. Diabetic peripheral neuropathy (HCC)   3. Pain due to onychomycosis of toenails of both feet    Plan:  Patient was evaluated and treated and all questions answered.  Left Achilles tendinitis -I explained patient the etiology of tendinitis and various treatment options were discussed.  Given that this is very mild in nature I believe patient will benefit from Tri-Lock ankle brace to hold the ankle in a good position to take the stress off of the Achilles.  Patient agrees with the plan would like to get the Tri-Lock ankle brace.  If the pain continues to get worse we will discuss cam boot immobilization versus steroid injection.  Right hallux interphalangeal joint exostosis -  I explained to the patient the etiology of exostosis likely due to history of trauma to that area.  It appears that there was a chronic fracture that may have healed and very malpositioned.  At this time it does not appear to be causing any kind of ulcer formation or any worsening of the deformity therefore I believe patient will benefit from conservative care with offloading.  I discussed with the patient in the future if this ever opens a new ulceration or causes any issues we can address this surgically at that time.  Patient's last A1c is also greater than 8 and therefore a high risk of wound complication.  I have encouraged patient to bring the A1c down below 8 to consider for any elective procedures.  Patient states understanding   Onychomycosis with pain  -Nails palliatively debrided as below. -Educated on self-care  Procedure: Nail Debridement Rationale: pain  Type of Debridement: manual, sharp debridement. Instrumentation: Nail nipper, rotary burr. Number of Nails: 10  Procedures and Treatment: Consent by patient was obtained for treatment procedures. The patient understood the discussion of treatment and procedures well. All questions were  answered thoroughly reviewed. Debridement of mycotic and hypertrophic toenails, 1 through 5 bilateral and clearing of subungual debris. No ulceration, no infection noted.  Return Visit-Office Procedure: Patient instructed to return to the office for a follow up visit 3 months for continued evaluation and treatment.  Boneta Lucks, DPM    No follow-ups on file.   No follow-ups on file.

## 2020-06-15 ENCOUNTER — Ambulatory Visit: Payer: Medicare Other | Admitting: Family Medicine

## 2020-06-15 NOTE — Progress Notes (Deleted)
   I, Wendy Poet, LAT, ATC, am serving as scribe for Dr. Lynne Leader.  [name] is a 64 y.o. male who presents to [facility] at [facility] today for f/u of R knee pain after falling out of his wheelchair ([place]).  He was last seen by Dr. Tamala Julian on 03/20/19 and was referred to aquatic therapy.  Since his last visit, pt reports    Pertinent review of systems: ***  Relevant historical information: ***   Exam:  There were no vitals taken for this visit. General: Well Developed, well nourished, and in no acute distress.   MSK: ***    Lab and Radiology Results No results found for this or any previous visit (from the past 72 hour(s)). No results found.     Assessment and Plan: 64 y.o. male with ***   PDMP not reviewed this encounter. No orders of the defined types were placed in this encounter.  No orders of the defined types were placed in this encounter.    Discussed warning signs or symptoms. Please see discharge instructions. Patient expresses understanding.   ***

## 2020-06-18 ENCOUNTER — Other Ambulatory Visit: Payer: Self-pay | Admitting: Internal Medicine

## 2020-06-18 ENCOUNTER — Ambulatory Visit: Payer: Medicare Other | Admitting: Family Medicine

## 2020-06-18 DIAGNOSIS — I10 Essential (primary) hypertension: Secondary | ICD-10-CM

## 2020-06-18 DIAGNOSIS — E1022 Type 1 diabetes mellitus with diabetic chronic kidney disease: Secondary | ICD-10-CM

## 2020-06-18 DIAGNOSIS — I5032 Chronic diastolic (congestive) heart failure: Secondary | ICD-10-CM

## 2020-06-18 DIAGNOSIS — E118 Type 2 diabetes mellitus with unspecified complications: Secondary | ICD-10-CM

## 2020-06-18 MED ORDER — LOSARTAN POTASSIUM 50 MG PO TABS: 50.0000 mg | ORAL_TABLET | Freq: Every day | ORAL | 1 refills | Status: DC

## 2020-06-19 DIAGNOSIS — N1832 Chronic kidney disease, stage 3b: Secondary | ICD-10-CM | POA: Diagnosis not present

## 2020-06-19 LAB — BASIC METABOLIC PANEL
BUN: 19 (ref 4–21)
CO2: 26 — AB (ref 13–22)
Chloride: 103 (ref 99–108)
Creatinine: 1.8 — AB (ref 0.6–1.3)
Glucose: 99
Potassium: 3.7 (ref 3.4–5.3)
Sodium: 140 (ref 137–147)

## 2020-06-19 LAB — CBC AND DIFFERENTIAL: Hemoglobin: 10 — AB (ref 13.5–17.5)

## 2020-06-19 LAB — COMPREHENSIVE METABOLIC PANEL
Albumin: 3.5 (ref 3.5–5.0)
Calcium: 8.9 (ref 8.7–10.7)

## 2020-06-19 LAB — VITAMIN D 25 HYDROXY (VIT D DEFICIENCY, FRACTURES): Vit D, 25-Hydroxy: 31.5

## 2020-06-23 ENCOUNTER — Ambulatory Visit: Payer: Medicare Other | Admitting: Family Medicine

## 2020-06-25 ENCOUNTER — Other Ambulatory Visit: Payer: Self-pay | Admitting: Internal Medicine

## 2020-06-29 ENCOUNTER — Other Ambulatory Visit: Payer: Self-pay | Admitting: *Deleted

## 2020-06-29 NOTE — Patient Outreach (Signed)
[facility]) Care Management  06/29/2020  Avyay Coger 01-21-1956 [id number]   [place] Monthly Outreach  Referral Date:11/27/2018 Referral Source:MD Referral Screening Reason for Referral:Disease Management Education Insurance:[employer] Medicare   Outreach Attempt:  Outreach attempt #1 to patient for follow up. No answer. RN Health Coach left voicemail message along with contact information.  Plan:  RN Health Coach will make another outreach attempt within the month of November if no return call back from patient.   [place] [phone] [email or web address]

## 2020-06-30 DIAGNOSIS — E113513 Type 2 diabetes mellitus with proliferative diabetic retinopathy with macular edema, bilateral: Secondary | ICD-10-CM | POA: Diagnosis not present

## 2020-06-30 DIAGNOSIS — H35373 Puckering of macula, bilateral: Secondary | ICD-10-CM | POA: Diagnosis not present

## 2020-06-30 DIAGNOSIS — H31093 Other chorioretinal scars, bilateral: Secondary | ICD-10-CM | POA: Diagnosis not present

## 2020-06-30 DIAGNOSIS — H2512 Age-related nuclear cataract, left eye: Secondary | ICD-10-CM | POA: Diagnosis not present

## 2020-07-06 ENCOUNTER — Telehealth: Payer: Self-pay | Admitting: Internal Medicine

## 2020-07-06 NOTE — Telephone Encounter (Signed)
Patient dropped off paper work to go to [employer]- yellow paper for a handicap placard earlier today and wanted to give the fax number   Fax [phone]

## 2020-07-06 NOTE — Telephone Encounter (Signed)
Form has been filled out and faxed back.

## 2020-07-10 ENCOUNTER — Encounter: Payer: Self-pay | Admitting: Internal Medicine

## 2020-07-16 NOTE — Telephone Encounter (Signed)
Called patient and LVM to let him know he can pick up his documents

## 2020-07-21 ENCOUNTER — Ambulatory Visit: Payer: Medicare Other | Admitting: Internal Medicine

## 2020-07-21 ENCOUNTER — Telehealth (HOSPITAL_COMMUNITY): Payer: Self-pay | Admitting: *Deleted

## 2020-07-21 NOTE — Telephone Encounter (Signed)
Pt dropped off form for [employer] needing cardiovascular section completed by Dr Aundra Dubin.   Cardiovascular section completed and signed by Dr Aundra Dubin and faxed to [employer] at [phone]  Pt aware and copy mailed to him

## 2020-07-22 ENCOUNTER — Encounter: Payer: Self-pay | Admitting: *Deleted

## 2020-07-22 ENCOUNTER — Other Ambulatory Visit: Payer: Self-pay | Admitting: *Deleted

## 2020-07-22 NOTE — Patient Instructions (Signed)
Goals Addressed              This Visit's Progress     [facility]) Learn More About My Health        Follow Up Date 10/18/20   - tell my story and reason for my visit - make a list of questions - ask questions - repeat what I heard to make sure I understand - bring a list of my medicines to the visit - speak up when I don't understand    Why is this important?   The best way to learn about your health and care is by talking to the doctor and nurse.  They will answer your questions and give you information in the way that you like best.    Notes:       [facility]) Make and Keep All Appointments        Follow Up Date 01/16/21   - arrange a ride through an agency 1 week before appointment - ask family or friend for a ride - call to cancel if needed - keep a calendar with appointment dates    Why is this important?   Part of staying healthy is seeing the doctor for follow-up care.  If you forget your appointments, there are some things you can do to stay on track.    Notes:       [facility]) Monitor and Manage My Blood Sugar        Follow Up Date 01/16/21    - check blood sugar at prescribed times - check blood sugar if I feel it is too high or too low - enter blood sugar readings and medication or insulin into daily log - take the blood sugar log to all doctor visits - take the blood sugar meter to all doctor visits -check blood sugars at least twice a day    Why is this important?   Checking your blood sugar at home helps to keep it from getting very high or very low.  Writing the results in a diary or log helps the doctor know how to care for you.  Your blood sugar log should have the time, date and the results.  Also, write down the amount of insulin or other medicine that you take.  Other information, like what you ate, exercise done and how you were feeling, will also be helpful.     Notes:       COMPLETED: [facility]) Patient will report decreasing Hgb A1C by 0.2 points within the  next 90 days. (pt-stated)        CARE PLAN ENTRY (see longtitudinal plan of care for additional care plan information)  Objective:  Lab Results  Component Value Date   HGBA1C 7.6 (H) 02/13/2020    Lab Results  Component Value Date   CREATININE 2.04 (H) 02/14/2020   CREATININE 1.81 (H) 10/31/2019   CREATININE 2.41 (H) 09/23/2019     No results found for: EGFR  Current Barriers:   Knowledge Deficits related to basic Diabetes pathophysiology and self care/management  Knowledge Deficits related to medications used for management of diabetes  Frustrations with self care management of chronic conditions and inability to have hernia surgery  Case Manager Clinical Goal(s):  Over the next 90 days, patient will demonstrate improved adherence to prescribed treatment plan for diabetes self care/management as evidenced by:  decreasing hgb A1C by 0.2 points  Verbalize daily monitoring and recording of CBG within 90 days  Verbalize adherence to ADA/  carb modified diet within the next 90 days  Verbalize exercise 2 days/week  Verbalize adherence to prescribed medication regimen within the next 90 days   Interventions:   Provided education to patient about basic DM disease process  Reviewed medications with patient and discussed importance of medication adherence  Discussed plans with patient for ongoing care management follow up and provided patient with direct contact information for care management team  Reviewed scheduled/upcoming provider appointments including: Cardiology appointment in September 2021  Patient Self Care Activities:   Self administers oral medications as prescribed  Self administers insulin as prescribed  Attends all scheduled provider appointments  Checks blood sugars as prescribed and utilize hyper and hypoglycemia protocol as needed  Adheres to prescribed ADA/carb modified  Initial goal documentation   Resolving due to duplicate goals        [facility]) Perform Foot Care        Follow Up Date 10/18/20   - check feet daily for cuts, sores or redness - keep feet up while sitting - wash and dry feet carefully every day - wear comfortable, cotton socks - wear comfortable, well-fitting shoes -discuss with provider wound center referral for left foot wound -continue to see podiatry for toe nail maintenance     Why is this important?   Good foot care is very important when you have diabetes.  There are many things you can do to keep your feet healthy and catch a problem early.    Notes:       [facility]) Set My Target A1C        Follow Up Date 10/18/20   - set target A1C -discuss with provider target Hgb A1C, current is 7.6    Why is this important?   Your target A1C is decided together by you and your doctor.  It is based on several things like your age and other health issues.    Notes:

## 2020-07-22 NOTE — Patient Outreach (Signed)
[name]) Care Management  [place]  07/22/2020   [name] May 07, 1956 [id number]   [place] [name] Other Month Outreach  Referral Date:11/27/2018 Referral Source:MD Referral Screening Reason for Referral:Disease Management Education Insurance:[employer]   Outreach Attempt:  Successful telephone outreach to patient for follow up.  HIPAA verified with patient.  Patient reporting he has wound to his left heel where his skin was dry and cracked open.  Denies any drainage of the wound at this time, but does state it was draining.  Has an appointment with primary care and is hoping for a wound clinic referral.  Reports he is back to monitoring his blood sugars twice a day.  Fasting blood sugar this morning was 97 with fasting ranges in the 90-100's.  Afternoon ranges are 180-220's.  Reports he no longer drink cool aid, congratulated patient on this dietary change.  Encounter Medications:  Outpatient Encounter Medications as of 07/22/2020  Medication Sig  . albuterol (VENTOLIN HFA) 108 (90 Base) MCG/ACT inhaler USE 2 INHALATIONS BY MOUTH  [name] 6 HOURS AS NEEDED FOR WHEEZING OR SHORTNESS OF  BREATH  . aspirin EC 81 MG tablet Take 1 tablet (81 mg total) by mouth daily.  Marland Kitchen atorvastatin (LIPITOR) 40 MG tablet Take 1 tablet (40 mg total) by mouth daily.  . Beta Carotene (VITAMIN A) 25000 UNIT capsule Take 1 capsule (25,000 Units total) by mouth daily.  . bisoprolol (ZEBETA) 10 MG tablet Take 1 tablet (10 mg total) by mouth [name] evening.  . Cholecalciferol 50 MCG (2000 UT) TABS Take 1 tablet (2,000 Units total) by mouth daily.  . cyanocobalamin 2000 MCG tablet Take 1 tablet (2,000 mcg total) by mouth daily.  Marland Kitchen docusate sodium (COLACE) 100 MG capsule Take 100 mg by mouth 2 (two) times daily.  . Empagliflozin-metFORMIN HCl (SYNJARDY) 12.01-999 MG TABS Take 1 tablet by mouth 2 (two) times daily.  . ferrous sulfate (FEROSUL) 325 (65 FE)  MG tablet Take 1 tablet (325 mg total) by mouth 2 (two) times daily with a meal.  . furosemide (LASIX) 80 MG tablet Take 120 mg by mouth daily.  . insulin detemir (LEVEMIR FLEXTOUCH) 100 UNIT/ML FlexPen Inject 200 Units into the skin daily.  Marland Kitchen levothyroxine (SYNTHROID) 50 MCG tablet Take 1 tablet (50 mcg total) by mouth daily before breakfast.  . Multiple Vitamins-Minerals (MULTIVITAMIN ADULT PO) Take by mouth.  Marland Kitchen omeprazole (PRILOSEC) 40 MG capsule Take 1 tablet by mouth 30 min before breakfast.  . potassium chloride SA (KLOR-CON) 20 MEQ tablet Take 1 tablet (20 mEq total) by mouth 2 (two) times daily.  Marland Kitchen thiamine (VITAMIN B-1) 100 MG tablet Take 1 tablet (100 mg total) by mouth daily.  . Accu-Chek Softclix Lancets lancets CHECK BLOOD SUGAR THREE TIMES DAILY  . blood glucose meter kit and supplies KIT Use to check blood sugar three times daily. DX: E11.8  . ciclopirox (PENLAC) 8 % solution Apply topically at bedtime. Apply over nail and surrounding skin. Apply daily over previous coat. After seven (7) days, may remove with alcohol and continue cycle.  . cycloSPORINE (RESTASIS) 0.05 % ophthalmic emulsion Place 1 drop into both eyes 2 (two) times daily.  Marland Kitchen glucose blood (ACCU-CHEK AVIVA PLUS) test strip Use to test blood sugar twice daily. DX E11.8  . HYDROcodone-acetaminophen (NORCO) 10-325 MG tablet TAKE 1 TABLET BY MOUTH [name] 6 HOURS AS NEEDED.  Marland Kitchen Insulin Pen Needle (ULTICARE SHORT PEN NEEDLES) 31G X 8 MM MISC USE WITH INSULIN  PENS DAILY.  Marland Kitchen losartan (COZAAR) 50 MG tablet Take 1 tablet (50 mg total) by mouth daily.  . phentermine 37.5 MG capsule Take 1 capsule (37.5 mg total) by mouth [name] morning. (Patient not taking: Reported on 07/22/2020)  . [DISCONTINUED] albuterol (VENTOLIN HFA) 108 (90 Base) MCG/ACT inhaler Inhale 2 puffs into the lungs [name] 6 (six) hours as needed for wheezing or shortness of breath.  . [DISCONTINUED] ferrous sulfate (FEROSUL) 325 (65 FE) MG tablet Take 1 tablet (325 mg  total) by mouth 2 (two) times daily with a meal.   No facility-administered encounter medications on file as of 07/22/2020.    Functional Status:  No flowsheet data found.  Fall/Depression Screening: Fall Risk  04/29/2020 12/10/2019 11/07/2019  Falls in the past year? _0 Comment - - -  Number falls in past yr: _1 Comment no recent falls no falls recently -  Injury with Fall? 0 0 0  Risk Factor Category  - - -  Comment - - -  Risk for fall due to : History of fall(s);Impaired balance/gait;Impaired mobility;Medication side effect History of fall(s);Medication side effect;Impaired mobility;Impaired balance/gait History of fall(s);Medication side effect;Impaired balance/gait;Impaired mobility  Risk for fall due to: Comment - - -  Follow up Falls prevention discussed;Education provided;Falls evaluation completed Falls prevention discussed;Education provided;Falls evaluation completed Falls prevention discussed;Education provided;Falls evaluation completed   PHQ 2/9 Scores 02/13/2020 12/26/2018 11/27/2018 11/22/2018 10/17/2017 08/08/2016 02/03/2016  PHQ - 2 Score 0 0 0 0 1 0 0  PHQ- 9 Score - - - - 9 - -    Goals Addressed              This Visit's Progress   .  [facility]) Learn More About My Health        Follow Up Date 10/18/20   - tell my story and reason for my visit - make a list of questions - ask questions - repeat what I heard to make sure I understand - bring a list of my medicines to the visit - speak up when I don't understand    Why is this important?   The best way to learn about your health and care is by talking to the doctor and nurse.  They will answer your questions and give you information in the way that you like best.    Notes:     .  [facility]) Make and Keep All Appointments        Follow Up Date 01/16/21   - arrange a ride through an agency 1 week before appointment - ask family or friend for a ride - call to cancel if needed - keep a calendar with appointment  dates    Why is this important?   Part of staying healthy is seeing the doctor for follow-up care.  If you forget your appointments, there are some things you can do to stay on track.    Notes:     .  [facility]) Monitor and Manage My Blood Sugar        Follow Up Date 01/16/21    - check blood sugar at prescribed times - check blood sugar if I feel it is too high or too low - enter blood sugar readings and medication or insulin into daily log - take the blood sugar log to all doctor visits - take the blood sugar meter to all doctor visits -check blood sugars at least twice a day    Why  is this important?   Checking your blood sugar at home helps to keep it from getting very high or very low.  Writing the results in a diary or log helps the doctor know how to care for you.  Your blood sugar log should have the time, date and the results.  Also, write down the amount of insulin or other medicine that you take.  Other information, like what you ate, exercise done and how you were feeling, will also be helpful.     Notes:     .  COMPLETED: [facility]) Patient will report decreasing Hgb A1C by 0.2 points within the next 90 days. (pt-stated)        CARE PLAN ENTRY (see longtitudinal plan of care for additional care plan information)  Objective:  Lab Results  Component Value Date   HGBA1C 7.6 (H) 02/13/2020 .   Lab Results  Component Value Date   CREATININE 2.04 (H) 02/14/2020   CREATININE 1.81 (H) 10/31/2019   CREATININE 2.41 (H) 09/23/2019 .   Marland Kitchen No results found for: EGFR  Current Barriers:  Marland Kitchen Knowledge Deficits related to basic Diabetes pathophysiology and self care/management . Knowledge Deficits related to medications used for management of diabetes . Frustrations with self care management of chronic conditions and inability to have hernia surgery  Case Manager Clinical Goal(s):  Over the next 90 days, patient will demonstrate improved adherence to prescribed treatment plan for  diabetes self care/management as evidenced by:  decreasing hgb A1C by 0.2 points . Verbalize daily monitoring and recording of CBG within 90 days . Verbalize adherence to ADA/ carb modified diet within the next 90 days . Verbalize exercise 2 days/week . Verbalize adherence to prescribed medication regimen within the next 90 days   Interventions:  . Provided education to patient about basic DM disease process . Reviewed medications with patient and discussed importance of medication adherence . Discussed plans with patient for ongoing care management follow up and provided patient with direct contact information for care management team . Reviewed scheduled/upcoming provider appointments including: Cardiology appointment in September 2021  Patient Self Care Activities:  . Self administers oral medications as prescribed . Self administers insulin as prescribed . Attends all scheduled provider appointments . Checks blood sugars as prescribed and utilize hyper and hypoglycemia protocol as needed . Adheres to prescribed ADA/carb modified  Initial goal documentation   Resolving due to duplicate goals     .  [facility]) Perform Foot Care        Follow Up Date 10/18/20   - check feet daily for cuts, sores or redness - keep feet up while sitting - wash and dry feet carefully [name] day - wear comfortable, cotton socks - wear comfortable, well-fitting shoes -discuss with provider wound center referral for left foot wound -continue to see podiatry for toe nail maintenance     Why is this important?   Good foot care is very important when you have diabetes.  There are many things you can do to keep your feet healthy and catch a problem early.    Notes:     .  [facility]) Set My Target A1C        Follow Up Date 10/18/20   - set target A1C -discuss with provider target Hgb A1C, current is 7.6    Why is this important?   Your target A1C is decided together by you and your doctor.  It is based on  several things like your age and  other health issues.    Notes:       Appointments:  Has scheduled appointment with primary care provider, Dr. [name] on 07/23/2020.  Attended appointment with Cardiology on 05/29/2020.  Plan: RN Health Coach will send primary care provider quarterly update. RN Health Coach will make next telephone outreach to patient within the month of January and patient agrees to future outreach.  [place] [phone] [email or web address]

## 2020-07-23 ENCOUNTER — Ambulatory Visit (INDEPENDENT_AMBULATORY_CARE_PROVIDER_SITE_OTHER): Payer: Medicare Other | Admitting: Internal Medicine

## 2020-07-23 ENCOUNTER — Encounter: Payer: Self-pay | Admitting: Internal Medicine

## 2020-07-23 ENCOUNTER — Other Ambulatory Visit: Payer: Self-pay

## 2020-07-23 VITALS — BP 114/76 | HR 68 | Temp 98.3°F | Ht 72.5 in

## 2020-07-23 DIAGNOSIS — I10 Essential (primary) hypertension: Secondary | ICD-10-CM

## 2020-07-23 DIAGNOSIS — E1165 Type 2 diabetes mellitus with hyperglycemia: Secondary | ICD-10-CM

## 2020-07-23 DIAGNOSIS — B351 Tinea unguium: Secondary | ICD-10-CM

## 2020-07-23 DIAGNOSIS — L97521 Non-pressure chronic ulcer of other part of left foot limited to breakdown of skin: Secondary | ICD-10-CM

## 2020-07-23 DIAGNOSIS — D508 Other iron deficiency anemias: Secondary | ICD-10-CM

## 2020-07-23 DIAGNOSIS — I739 Peripheral vascular disease, unspecified: Secondary | ICD-10-CM

## 2020-07-23 DIAGNOSIS — E118 Type 2 diabetes mellitus with unspecified complications: Secondary | ICD-10-CM

## 2020-07-23 DIAGNOSIS — E039 Hypothyroidism, unspecified: Secondary | ICD-10-CM | POA: Diagnosis not present

## 2020-07-23 DIAGNOSIS — I5032 Chronic diastolic (congestive) heart failure: Secondary | ICD-10-CM

## 2020-07-23 DIAGNOSIS — E538 Deficiency of other specified B group vitamins: Secondary | ICD-10-CM | POA: Diagnosis not present

## 2020-07-23 DIAGNOSIS — E519 Thiamine deficiency, unspecified: Secondary | ICD-10-CM

## 2020-07-23 LAB — HEPATIC FUNCTION PANEL
ALT: 20 U/L (ref 0–53)
AST: 22 U/L (ref 0–37)
Albumin: 3.5 g/dL (ref 3.5–5.2)
Alkaline Phosphatase: 111 U/L (ref 39–117)
Bilirubin, Direct: 0.1 mg/dL (ref 0.0–0.3)
Total Bilirubin: 0.3 mg/dL (ref 0.2–1.2)
Total Protein: 7.9 g/dL (ref 6.0–8.3)

## 2020-07-23 LAB — CBC WITH DIFFERENTIAL/PLATELET
Basophils Absolute: 0 10*3/uL (ref 0.0–0.1)
Basophils Relative: 0.4 % (ref 0.0–3.0)
Eosinophils Absolute: 0.3 10*3/uL (ref 0.0–0.7)
Eosinophils Relative: 3.2 % (ref 0.0–5.0)
HCT: 32.3 % — ABNORMAL LOW (ref 39.0–52.0)
Hemoglobin: 10.3 g/dL — ABNORMAL LOW (ref 13.0–17.0)
Lymphocytes Relative: 22.1 % (ref 12.0–46.0)
Lymphs Abs: 1.9 10*3/uL (ref 0.7–4.0)
MCHC: 32 g/dL (ref 30.0–36.0)
MCV: 89.9 fl (ref 78.0–100.0)
Monocytes Absolute: 0.6 10*3/uL (ref 0.1–1.0)
Monocytes Relative: 6.6 % (ref 3.0–12.0)
Neutro Abs: 5.7 10*3/uL (ref 1.4–7.7)
Neutrophils Relative %: 67.7 % (ref 43.0–77.0)
Platelets: 254 10*3/uL (ref 150.0–400.0)
RBC: 3.59 Mil/uL — ABNORMAL LOW (ref 4.22–5.81)
RDW: 16 % — ABNORMAL HIGH (ref 11.5–15.5)
WBC: 8.4 10*3/uL (ref 4.0–10.5)

## 2020-07-23 LAB — TSH: TSH: 1.77 u[IU]/mL (ref 0.35–4.50)

## 2020-07-23 LAB — HEMOGLOBIN A1C: Hgb A1c MFr Bld: 7.6 % — ABNORMAL HIGH (ref 4.6–6.5)

## 2020-07-23 LAB — FOLATE: Folate: 9.8 ng/mL (ref >5.9)

## 2020-07-23 LAB — IRON: Iron: 44 ug/dL (ref 42–165)

## 2020-07-23 MED ORDER — LEVEMIR FLEXTOUCH 100 UNIT/ML ~~LOC~~ SOPN: 200.0000 [IU] | PEN_INJECTOR | Freq: Every day | SUBCUTANEOUS | 1 refills | Status: DC

## 2020-07-23 MED ORDER — BISOPROLOL FUMARATE 10 MG PO TABS: 10.0000 mg | ORAL_TABLET | Freq: Every evening | ORAL | 1 refills | Status: DC

## 2020-07-23 MED ORDER — GVOKE HYPOPEN 2-PACK 1 MG/0.2ML ~~LOC~~ SOAJ: 1.0000 | Freq: Every day | SUBCUTANEOUS | 5 refills | Status: DC | PRN

## 2020-07-23 MED ORDER — TERBINAFINE HCL 250 MG PO TABS: 250.0000 mg | ORAL_TABLET | Freq: Every day | ORAL | 0 refills | Status: DC

## 2020-07-23 NOTE — Patient Instructions (Signed)
Athlete's Foot  Athlete's foot (tinea pedis) is a fungal infection of the skin on your feet. It often occurs on the skin that is between or underneath your toes. It can also occur on the soles of your feet. Symptoms include itchy or white and flaky areas on the skin. The infection can spread from person to person (is contagious). It can also spread when a person's bare feet come in contact with the fungus on shower floors or on items such as shoes. Follow these instructions at home: Medicines  Apply or take over-the-counter and prescription medicines only as told by your doctor.  Apply your antifungal medicine as told by your doctor. Do not stop using the medicine even if your feet start to get better. Foot care  Do not scratch your feet.  Keep your feet dry: ? Wear cotton or wool socks. Change your socks every day or if they become wet. ? Wear shoes that allow air to move around, such as sandals or canvas tennis shoes.  Wash and dry your feet: ? Every day or as told by your doctor. ? After exercising. ? Including the area between your toes. General instructions  Do not share any of these items that touch your feet: ? Towels. ? Shoes. ? Nail clippers. ? Other personal items.  Protect your feet by wearing sandals in wet areas, such as locker rooms and shared showers.  Keep all follow-up visits as told by your doctor. This is important.  If you have diabetes, keep your blood sugar under control. Contact a doctor if:  You have a fever.  You have swelling, pain, warmth, or redness in your foot.  Your feet are not getting better with treatment.  Your symptoms get worse.  You have new symptoms. Summary  Athlete's foot is a fungal infection of the skin on your feet.  Symptoms include itchy or white and flaky areas on the skin.  Apply your antifungal medicine as told by your doctor.  Keep your feet clean and dry. This information is not intended to replace advice given  to you by your health care provider. Make sure you discuss any questions you have with your health care provider. Document Revised: 08/31/2017 Document Reviewed: 06/26/2017 [employer] Patient Education  2020 [employer]  

## 2020-07-23 NOTE — Progress Notes (Signed)
Subjective:  Patient ID: [name], male    DOB: 04-14-1956  Age: 64 y.o. MRN: [id number]  CC: Anemia, Congestive Heart Failure, Hypothyroidism, Hypertension, and Diabetes  This visit occurred during the SARS-CoV-2 public health emergency.  Safety protocols were in place, including screening questions prior to the visit, additional usage of staff PPE, and extensive cleaning of exam room while observing appropriate contact time as indicated for disinfecting solutions.    HPI [name] presents for f/up -   He is concerned about his left foot.  He tells me he has been seeing podiatry but not much has been done.  He complains over the last week that the skin around his left heel has peeled away.  Its not painful.  He complains that his toenails are thick and discolored.  He has chronic, unchanged lower extremity edema.  He gets adequate relief from his pain with oxycodone and acetaminophen.  He has a history of anemia.  He tells me he is compliant with all of his vitamin supplements.  He has recently been able to lose weight with an appetite suppressant.  He tells me his blood sugars have been well controlled.  Outpatient Medications Prior to Visit  Medication Sig Dispense Refill  . Accu-Chek Softclix Lancets lancets CHECK BLOOD SUGAR THREE TIMES DAILY 100 each 5  . albuterol (VENTOLIN HFA) 108 (90 Base) MCG/ACT inhaler USE 2 INHALATIONS BY MOUTH  EVERY 6 HOURS AS NEEDED FOR WHEEZING OR SHORTNESS OF  BREATH 34 g 3  . aspirin EC 81 MG tablet Take 1 tablet (81 mg total) by mouth daily. 90 tablet 1  . atorvastatin (LIPITOR) 40 MG tablet Take 1 tablet (40 mg total) by mouth daily. 90 tablet 1  . Beta Carotene (VITAMIN A) 25000 UNIT capsule Take 1 capsule (25,000 Units total) by mouth daily. 90 capsule 1  . blood glucose meter kit and supplies KIT Use to check blood sugar three times daily. DX: E11.8 2 each 0  . Cholecalciferol 50 MCG (2000 UT) TABS Take 1 tablet (2,000 Units total) by  mouth daily. 90 tablet 1  . ciclopirox (PENLAC) 8 % solution Apply topically at bedtime. Apply over nail and surrounding skin. Apply daily over previous coat. After seven (7) days, may remove with alcohol and continue cycle. 6.6 mL 0  . cyanocobalamin 2000 MCG tablet Take 1 tablet (2,000 mcg total) by mouth daily. 90 tablet 1  . cycloSPORINE (RESTASIS) 0.05 % ophthalmic emulsion Place 1 drop into both eyes 2 (two) times daily. 22 each 1  . docusate sodium (COLACE) 100 MG capsule Take 100 mg by mouth 2 (two) times daily.    . Empagliflozin-metFORMIN HCl (SYNJARDY) 12.01-999 MG TABS Take 1 tablet by mouth 2 (two) times daily. 180 tablet 1  . ferrous sulfate (FEROSUL) 325 (65 FE) MG tablet Take 1 tablet (325 mg total) by mouth 2 (two) times daily with a meal. 180 tablet 1  . furosemide (LASIX) 80 MG tablet Take 120 mg by mouth daily.    Marland Kitchen glucose blood (ACCU-CHEK AVIVA PLUS) test strip Use to test blood sugar twice daily. DX E11.8 200 strip 3  . HYDROcodone-acetaminophen (NORCO) 10-325 MG tablet TAKE 1 TABLET BY MOUTH EVERY 6 HOURS AS NEEDED. 60 tablet 0  . Insulin Pen Needle (ULTICARE SHORT PEN NEEDLES) 31G X 8 MM MISC USE WITH INSULIN PENS DAILY. 100 each 3  . levothyroxine (SYNTHROID) 50 MCG tablet Take 1 tablet (50 mcg total) by mouth  daily before breakfast. 90 tablet 0  . Multiple Vitamins-Minerals (MULTIVITAMIN ADULT PO) Take by mouth.    Marland Kitchen omeprazole (PRILOSEC) 40 MG capsule Take 1 tablet by mouth 30 min before breakfast. 90 capsule 3  . phentermine 37.5 MG capsule Take 1 capsule (37.5 mg total) by mouth every morning. 30 capsule 2  . thiamine (VITAMIN B-1) 100 MG tablet Take 1 tablet (100 mg total) by mouth daily. 90 tablet 1  . bisoprolol (ZEBETA) 10 MG tablet Take 1 tablet (10 mg total) by mouth every evening. 90 tablet 1  . insulin detemir (LEVEMIR FLEXTOUCH) 100 UNIT/ML FlexPen Inject 200 Units into the skin daily. 225 mL 1  . losartan (COZAAR) 50 MG tablet Take 1 tablet (50 mg total) by  mouth daily. 90 tablet 1  . potassium chloride SA (KLOR-CON) 20 MEQ tablet Take 1 tablet (20 mEq total) by mouth 2 (two) times daily. 180 tablet 3   No facility-administered medications prior to visit.    ROS Review of Systems  Constitutional: Negative for appetite change, chills, diaphoresis, fatigue and fever.  HENT: Negative.   Eyes: Negative.   Respiratory: Negative for cough, chest tightness, shortness of breath and wheezing.   Cardiovascular: Negative for chest pain, palpitations and leg swelling.  Gastrointestinal: Negative for abdominal pain, constipation, diarrhea, nausea and vomiting.  Endocrine: Negative.   Genitourinary: Negative.  Negative for difficulty urinating.  Musculoskeletal: Positive for arthralgias and back pain. Negative for myalgias and neck pain.  Skin: Negative for pallor and rash.  Neurological: Negative.  Negative for dizziness, weakness, light-headedness and numbness.  Hematological: Negative for adenopathy. Does not bruise/bleed easily.  Psychiatric/Behavioral: Negative.     Objective:  BP 114/76   Pulse 68   Temp 98.3 F (36.8 C) (Oral)   Ht 6' 0.5" (1.842 m)   SpO2 96%   BMI 57.78 kg/m   BP Readings from Last 3 Encounters:  07/23/20 114/76  05/29/20 116/64  04/27/20 118/62    Wt Readings from Last 3 Encounters:  05/29/20 (!) 432 lb (196 kg)  04/27/20 (!) 440 lb (199.6 kg)  02/13/20 (!) 438 lb (198.7 kg)    Physical Exam Vitals reviewed.  Constitutional:      Appearance: He is obese. He is ill-appearing (in a wheelchair).  HENT:     Nose: Nose normal.     Mouth/Throat:     Mouth: Mucous membranes are moist.  Eyes:     General: No scleral icterus.    Conjunctiva/sclera: Conjunctivae normal.  Cardiovascular:     Rate and Rhythm: Normal rate and regular rhythm.     Pulses:          Dorsalis pedis pulses are 0 on the right side and 0 on the left side.       Posterior tibial pulses are 0 on the right side and 0 on the left side.      Heart sounds: No murmur heard.   Pulmonary:     Effort: Pulmonary effort is normal.     Breath sounds: No stridor. No wheezing, rhonchi or rales.  Abdominal:     General: Abdomen is protuberant. Bowel sounds are normal. There is no distension.     Palpations: Abdomen is soft. There is no hepatomegaly, splenomegaly or mass.     Tenderness: There is no abdominal tenderness.  Musculoskeletal:        General: No swelling.     Cervical back: Neck supple.     Right lower leg: Edema present.  Left lower leg: Edema present.     Right foot: Decreased range of motion.     Left foot: Decreased range of motion.  Feet:     Right foot:     Skin integrity: Dry skin present. No ulcer or blister.     Toenail Condition: Fungal disease present.    Left foot:     Skin integrity: Ulcer, skin breakdown and dry skin present. No blister, erythema, warmth or callus.     Toenail Condition: Fungal disease present. Lymphadenopathy:     Cervical: No cervical adenopathy.  Skin:    General: Skin is warm and dry.     Findings: Rash present.  Neurological:     General: No focal deficit present.     Mental Status: He is alert. Mental status is at baseline.  Psychiatric:        Mood and Affect: Mood normal.        Behavior: Behavior normal.     Lab Results  Component Value Date   WBC 8.4 07/23/2020   HGB 10.3 (L) 07/23/2020   HCT 32.3 (L) 07/23/2020   PLT 254.0 07/23/2020   GLUCOSE 151 (H) 05/29/2020   CHOL 174 02/13/2020   TRIG 101.0 02/13/2020   HDL 32.50 (L) 02/13/2020   LDLDIRECT 136.8 12/12/2006   LDLCALC 122 (H) 02/13/2020   ALT 20 07/23/2020   AST 22 07/23/2020   NA 140 06/19/2020   K 3.7 06/19/2020   CL 103 06/19/2020   CREATININE 1.8 (A) 06/19/2020   BUN 19 06/19/2020   CO2 26 (A) 06/19/2020   TSH 1.77 07/23/2020   PSA 1.73 02/13/2020   HGBA1C 7.6 (H) 07/23/2020   MICROALBUR 1.0 10/31/2019    VAS [place] LOWER EXTREMITY VENOUS (DVT)  Result Date: 05/29/2020  Lower Venous  DVTStudy Indications: Swelling, and Pain.  Limitations: Body habitus, poor ultrasound/tissue interface and study done in wheelchair. Comparison Study: 10/23/17 previous Performing Technologist: Abram Sander RVS  Examination Guidelines: A complete evaluation includes B-mode imaging, spectral Doppler, color Doppler, and power Doppler as needed of all accessible portions of each vessel. Bilateral testing is considered an integral part of a complete examination. Limited examinations for reoccurring indications may be performed as noted. The reflux portion of the exam is performed with the patient in reverse Trendelenburg.  +---------+---------------+---------+-----------+----------+--------------+ RIGHT    CompressibilityPhasicitySpontaneityPropertiesThrombus Aging +---------+---------------+---------+-----------+----------+--------------+ CFV      Full           Yes      Yes                                 +---------+---------------+---------+-----------+----------+--------------+ SFJ      Full                                                        +---------+---------------+---------+-----------+----------+--------------+ FV Prox  Full                                                        +---------+---------------+---------+-----------+----------+--------------+ FV Mid  Yes      Yes                                 +---------+---------------+---------+-----------+----------+--------------+ FV Distal               Yes      Yes                                 +---------+---------------+---------+-----------+----------+--------------+ PFV      Full                                                        +---------+---------------+---------+-----------+----------+--------------+ POP      Full           Yes      Yes                                 +---------+---------------+---------+-----------+----------+--------------+ PTV      Full                                                         +---------+---------------+---------+-----------+----------+--------------+ PERO                                                  Not visualized +---------+---------------+---------+-----------+----------+--------------+   +---------+---------------+---------+-----------+----------+--------------+ LEFT     CompressibilityPhasicitySpontaneityPropertiesThrombus Aging +---------+---------------+---------+-----------+----------+--------------+ CFV      Full           Yes      Yes                                 +---------+---------------+---------+-----------+----------+--------------+ SFJ      Full                                                        +---------+---------------+---------+-----------+----------+--------------+ FV Prox                 Yes      Yes                                 +---------+---------------+---------+-----------+----------+--------------+ FV Mid                  Yes      Yes                                 +---------+---------------+---------+-----------+----------+--------------+ FV Distal               Yes  Yes                                 +---------+---------------+---------+-----------+----------+--------------+ PFV                                                   Continued      +---------+---------------+---------+-----------+----------+--------------+ POP                     Yes      Yes                                 +---------+---------------+---------+-----------+----------+--------------+ PTV      Full                                                        +---------+---------------+---------+-----------+----------+--------------+ PERO                                                  Not visualized +---------+---------------+---------+-----------+----------+--------------+     Summary: RIGHT: - There is no evidence of deep vein thrombosis in the lower  extremity. However, portions of this examination were limited- see technologist comments above.  - No cystic structure found in the popliteal fossa.  LEFT: - There is no evidence of deep vein thrombosis in the lower extremity. However, portions of this examination were limited- see technologist comments above.  - No cystic structure found in the popliteal fossa.  *See table(s) above for measurements and observations. Electronically signed by Monica Martinez MD on 05/29/2020 at 4:41:16 PM.    Final     Assessment & Plan:   [name] was seen today for anemia, congestive heart failure, hypothyroidism, hypertension and diabetes.  Diagnoses and all orders for this visit:  Type 2 diabetes mellitus with complication, without long-term current use of insulin ([place])- His A1c is at 7.6%.  His blood sugar is adequately well controlled. -     insulin detemir (LEVEMIR FLEXTOUCH) 100 UNIT/ML FlexPen; Inject 200 Units into the skin daily. -     Glucagon (GVOKE HYPOPEN 2-PACK) 1 MG/0.2ML SOAJ; Inject 1 Act into the skin daily as needed. -     Hemoglobin A1c; Future -     Hemoglobin A1c  Essential hypertension- His blood pressure is adequately well controlled. -     bisoprolol (ZEBETA) 10 MG tablet; Take 1 tablet (10 mg total) by mouth every evening.  Chronic diastolic CHF (congestive heart failure) ([place])- It is difficult to assess his volume status but based on my best estimation he is at his baseline.  Will continue the beta-blocker and thiazide diuretic. -     bisoprolol (ZEBETA) 10 MG tablet; Take 1 tablet (10 mg total) by mouth every evening.  Type II diabetes mellitus with manifestations (HCC) -     insulin detemir (LEVEMIR FLEXTOUCH) 100 UNIT/ML FlexPen; Inject 200 Units into the skin daily. -     Glucagon (GVOKE  HYPOPEN 2-PACK) 1 MG/0.2ML SOAJ; Inject 1 Act into the skin daily as needed. -     Hemoglobin A1c; Future -     Hemoglobin A1c  Uncontrolled type 2 diabetes mellitus with hyperglycemia  (HCC)  Acquired hypothyroidism- His TSH is in the normal range.  He will remain on the current dose of levothyroxine. -     TSH; Future -     TSH  B12 deficiency- His H&H have improved slightly.  He will continue high dose oral B12 replacement therapy. -     CBC with Differential/Platelet; Future -     Folate; Future -     Folate -     CBC with Differential/Platelet  Other iron deficiency anemia- His H&H have improved.  He will continue iron replacement therapy. -     CBC with Differential/Platelet; Future -     Iron; Future -     Iron -     CBC with Differential/Platelet  Thiamine deficiency- He will contact he and you taking the thiamine supplement.  Ulcer of left foot, limited to breakdown of skin ([place])- I am concerned the ulcer may be a sign of PAOD.  I have asked him to see vascular surgery.  He will also see wound care to see if there are any topical remedies that would benefit him. -     Ambulatory referral to [facility] -     Ambulatory referral to Vascular Surgery  Onychomycosis of toenail- I think he should be treated for fungal disease.  I recommended a 64-monthregimen of oral terbinafine. -     terbinafine (LAMISIL) 250 MG tablet; Take 1 tablet (250 mg total) by mouth daily. -     Hepatic function panel; Future -     Hepatic function panel  PAD (peripheral artery disease) [place] -     Ambulatory referral to Vascular Surgery   I am having [name] start on Gvoke HypoPen 2-Pack and terbinafine. I am also having him maintain his omeprazole, Multiple Vitamins-Minerals (MULTIVITAMIN ADULT PO), blood glucose meter kit and supplies, Restasis, thiamine, levothyroxine, UltiCare Short Pen Needles, Accu-Chek Aviva Plus, ferrous sulfate, Synjardy, cyanocobalamin, Cholecalciferol, vitamin A, atorvastatin, aspirin EC, Accu-Chek Softclix Lancets, phentermine, docusate sodium, furosemide, HYDROcodone-acetaminophen, ciclopirox, albuterol, Levemir FlexTouch, and bisoprolol.  Meds  ordered this encounter  Medications  . insulin detemir (LEVEMIR FLEXTOUCH) 100 UNIT/ML FlexPen    Sig: Inject 200 Units into the skin daily.    Dispense:  225 mL    Refill:  1  . bisoprolol (ZEBETA) 10 MG tablet    Sig: Take 1 tablet (10 mg total) by mouth every evening.    Dispense:  90 tablet    Refill:  1  . Glucagon (GVOKE HYPOPEN 2-PACK) 1 MG/0.2ML SOAJ    Sig: Inject 1 Act into the skin daily as needed.    Dispense:  2 mL    Refill:  5  . terbinafine (LAMISIL) 250 MG tablet    Sig: Take 1 tablet (250 mg total) by mouth daily.    Dispense:  90 tablet    Refill:  0   I spent 50 minutes in preparing to see the patient by review of recent labs, imaging and procedures, obtaining and reviewing separately obtained history, communicating with the patient and family or caregiver, ordering medications, tests or procedures, and documenting clinical information in the EHR including the differential Dx, treatment, and any further evaluation and other management of 1. Essential hypertension 2. Chronic diastolic CHF (congestive  heart failure) ([place]) 3. Type 2 diabetes mellitus with complication, without long-term current use of insulin ([place]) 4. Type II diabetes mellitus with manifestations ([address]) 5. Uncontrolled type 2 diabetes mellitus with hyperglycemia ([place]) 6. Acquired hypothyroidism 7. B12 deficiency 8. Other iron deficiency anemia 9. Thiamine deficiency 10. Ulcer of left foot, limited to breakdown of skin ([place]) 11. Onychomycosis of toenail 12. PAD (peripheral artery disease) ([place])      Follow-up: Return in about 3 months (around 10/23/2020).  Scarlette Calico, MD

## 2020-07-24 ENCOUNTER — Telehealth: Payer: Self-pay | Admitting: Internal Medicine

## 2020-07-24 DIAGNOSIS — E1022 Type 1 diabetes mellitus with diabetic chronic kidney disease: Secondary | ICD-10-CM

## 2020-07-24 DIAGNOSIS — I5032 Chronic diastolic (congestive) heart failure: Secondary | ICD-10-CM

## 2020-07-24 DIAGNOSIS — I10 Essential (primary) hypertension: Secondary | ICD-10-CM

## 2020-07-24 DIAGNOSIS — I739 Peripheral vascular disease, unspecified: Secondary | ICD-10-CM | POA: Insufficient documentation

## 2020-07-24 DIAGNOSIS — E118 Type 2 diabetes mellitus with unspecified complications: Secondary | ICD-10-CM

## 2020-07-24 MED ORDER — LOSARTAN POTASSIUM 50 MG PO TABS: 50.0000 mg | ORAL_TABLET | Freq: Every day | ORAL | 1 refills | Status: DC

## 2020-07-24 MED ORDER — POTASSIUM CHLORIDE CRYS ER 20 MEQ PO TBCR: 20.0000 meq | EXTENDED_RELEASE_TABLET | Freq: Two times a day (BID) | ORAL | 1 refills | Status: DC

## 2020-07-24 NOTE — Telephone Encounter (Signed)
  Patient requesting refill for losartan (COZAAR) 50 MG tablet and potassium chloride SA (KLOR-CON) 20 MEQ tablet to [facility]

## 2020-08-05 ENCOUNTER — Encounter: Payer: Self-pay | Admitting: Podiatry

## 2020-08-05 ENCOUNTER — Other Ambulatory Visit: Payer: Self-pay

## 2020-08-05 ENCOUNTER — Ambulatory Visit (INDEPENDENT_AMBULATORY_CARE_PROVIDER_SITE_OTHER): Payer: Medicare Other | Admitting: Podiatry

## 2020-08-05 DIAGNOSIS — M21619 Bunion of unspecified foot: Secondary | ICD-10-CM | POA: Diagnosis not present

## 2020-08-05 DIAGNOSIS — M898X9 Other specified disorders of bone, unspecified site: Secondary | ICD-10-CM

## 2020-08-05 DIAGNOSIS — M2041 Other hammer toe(s) (acquired), right foot: Secondary | ICD-10-CM | POA: Diagnosis not present

## 2020-08-05 DIAGNOSIS — E1142 Type 2 diabetes mellitus with diabetic polyneuropathy: Secondary | ICD-10-CM

## 2020-08-05 DIAGNOSIS — B351 Tinea unguium: Secondary | ICD-10-CM | POA: Diagnosis not present

## 2020-08-05 DIAGNOSIS — M79674 Pain in right toe(s): Secondary | ICD-10-CM

## 2020-08-05 DIAGNOSIS — M79675 Pain in left toe(s): Secondary | ICD-10-CM

## 2020-08-05 DIAGNOSIS — M2042 Other hammer toe(s) (acquired), left foot: Secondary | ICD-10-CM

## 2020-08-05 NOTE — Progress Notes (Signed)
Subjective:   Patient ID: [name], male   DOB: 64 y.o.   MRN: [id number]   HPI Obese male who is in wheelchair who is got very dry skin history of ulceration with concerns about his heel and pain in both feet with nail disease and tenderness   ROS      Objective:  Physical Exam  Neurovascular status unchanged with significant dryness of both feet with crusted tissue on the left plantar heel localized with slight breakdown but no drainage no subcutaneous tissue exposure with obesity is complicating factor.  Patient also found to have nail disease 1-5 both feet that are thick and he cannot cut     Assessment:  Pressure occurring against the heel with neuropathic changes digital deformities bunion deformities history of ulceration and mycotic nail infection     Plan:  H&P reviewed all conditions debrided out soft tissue plantar left no indications of drainage and no iatrogenic bleeding.  Discussed the continuation of conservative care and I do think diabetic shoes will be beneficial for him and I am sending a request to Dr. [name] for diabetic shoes.  Educated him on daily inspections and padding of his wheelchair and will be seen back as needed

## 2020-08-10 ENCOUNTER — Other Ambulatory Visit: Payer: Self-pay | Admitting: Internal Medicine

## 2020-08-12 ENCOUNTER — Other Ambulatory Visit: Payer: Self-pay | Admitting: Internal Medicine

## 2020-08-19 NOTE — Telephone Encounter (Signed)
Patient calling to get a refill status on this medication

## 2020-08-24 ENCOUNTER — Encounter (HOSPITAL_BASED_OUTPATIENT_CLINIC_OR_DEPARTMENT_OTHER): Payer: Medicare Other | Admitting: Internal Medicine

## 2020-09-06 ENCOUNTER — Other Ambulatory Visit: Payer: Self-pay | Admitting: Internal Medicine

## 2020-09-06 DIAGNOSIS — E785 Hyperlipidemia, unspecified: Secondary | ICD-10-CM

## 2020-09-06 DIAGNOSIS — E039 Hypothyroidism, unspecified: Secondary | ICD-10-CM

## 2020-09-08 ENCOUNTER — Ambulatory Visit (INDEPENDENT_AMBULATORY_CARE_PROVIDER_SITE_OTHER): Payer: Medicare Other | Admitting: Internal Medicine

## 2020-09-08 ENCOUNTER — Encounter: Payer: Self-pay | Admitting: Internal Medicine

## 2020-09-08 ENCOUNTER — Other Ambulatory Visit: Payer: Self-pay

## 2020-09-08 VITALS — BP 128/84 | HR 74 | Temp 98.0°F | Resp 16 | Ht 72.5 in | Wt >= 6400 oz

## 2020-09-08 DIAGNOSIS — Z6841 Body Mass Index (BMI) 40.0 and over, adult: Secondary | ICD-10-CM

## 2020-09-08 DIAGNOSIS — E662 Morbid (severe) obesity with alveolar hypoventilation: Secondary | ICD-10-CM

## 2020-09-08 NOTE — Progress Notes (Signed)
Subjective:  Patient ID: [name], male    DOB: July 11, 1956  Age: 64 y.o. MRN: [id number]  CC: Diabetes, Anemia, Osteoarthritis, and Back Pain  This visit occurred during the SARS-CoV-2 public health emergency.  Safety protocols were in place, including screening questions prior to the visit, additional usage of staff PPE, and extensive cleaning of exam room while observing appropriate contact time as indicated for disinfecting solutions.    HPI [name] presents for f/up -    Reason for Visit: Mobility Evaluation   Patient suffers from Chronic Back Pain, Osteoarthritis, Type 2 Diabetes, and Peripheral Neuropathy which impairs his ability to perform daily activities like toileting, feeding, dressing, grooming, bathing. In the home a cane, walker or crutch will not resolve their issues with performing activities of daily living. A scooter will allow patient to safely perform daily activities. Patient can safely operate a scooter in the home. Patient has the mental and physical capacity to follow and operate the scooter instructions safely. Patient is willing to use it in the home as well as outside of the home. Upper and Lower Extremity Strength is 4/5.    Outpatient Medications Prior to Visit  Medication Sig Dispense Refill  . Accu-Chek Softclix Lancets lancets CHECK BLOOD SUGAR 3 TIMES  DAILY 300 each 3  . albuterol (VENTOLIN HFA) 108 (90 Base) MCG/ACT inhaler USE 2 INHALATIONS BY MOUTH  EVERY 6 HOURS AS NEEDED FOR WHEEZING OR SHORTNESS OF  BREATH 34 g 3  . aspirin EC 81 MG tablet Take 1 tablet (81 mg total) by mouth daily. 90 tablet 1  . atorvastatin (LIPITOR) 40 MG tablet TAKE 1 TABLET BY MOUTH  DAILY 90 tablet 1  . Beta Carotene (VITAMIN A) 25000 UNIT capsule Take 1 capsule (25,000 Units total) by mouth daily. 90 capsule 1  . bisoprolol (ZEBETA) 10 MG tablet Take 1 tablet (10 mg total) by mouth every evening. 90 tablet 1  . blood glucose meter kit and supplies KIT Use to check  blood sugar three times daily. DX: E11.8 2 each 0  . Cholecalciferol 50 MCG (2000 UT) TABS Take 1 tablet (2,000 Units total) by mouth daily. 90 tablet 1  . ciclopirox (PENLAC) 8 % solution Apply topically at bedtime. Apply over nail and surrounding skin. Apply daily over previous coat. After seven (7) days, may remove with alcohol and continue cycle. 6.6 mL 0  . cyanocobalamin 2000 MCG tablet Take 1 tablet (2,000 mcg total) by mouth daily. 90 tablet 1  . cycloSPORINE (RESTASIS) 0.05 % ophthalmic emulsion Place 1 drop into both eyes 2 (two) times daily. 22 each 1  . docusate sodium (COLACE) 100 MG capsule Take 100 mg by mouth 2 (two) times daily.    . Empagliflozin-metFORMIN HCl (SYNJARDY) 12.01-999 MG TABS Take 1 tablet by mouth 2 (two) times daily. 180 tablet 1  . ferrous sulfate (FEROSUL) 325 (65 FE) MG tablet Take 1 tablet (325 mg total) by mouth 2 (two) times daily with a meal. 180 tablet 1  . furosemide (LASIX) 80 MG tablet Take 120 mg by mouth daily.    . Glucagon (GVOKE HYPOPEN 2-PACK) 1 MG/0.2ML SOAJ Inject 1 Act into the skin daily as needed. 2 mL 5  . glucose blood (ACCU-CHEK AVIVA PLUS) test strip Use to test blood sugar twice daily. DX E11.8 200 strip 3  . HYDROcodone-acetaminophen (NORCO) 10-325 MG tablet TAKE 1 TABLET BY MOUTH EVERY 6 HOURS AS NEEDED. 60 tablet 0  . insulin detemir (LEVEMIR FLEXTOUCH) 100  UNIT/ML FlexPen Inject 200 Units into the skin daily. 225 mL 1  . Insulin Pen Needle (ULTICARE SHORT PEN NEEDLES) 31G X 8 MM MISC USE WITH INSULIN PENS DAILY. 100 each 3  . levothyroxine (SYNTHROID) 50 MCG tablet TAKE 1 TABLET BY MOUTH  DAILY BEFORE BREAKFAST 90 tablet 1  . losartan (COZAAR) 25 MG tablet Take 50 mg by mouth daily.    Marland Kitchen losartan (COZAAR) 50 MG tablet Take 1 tablet (50 mg total) by mouth daily. 90 tablet 1  . Multiple Vitamins-Minerals (MULTIVITAMIN ADULT PO) Take by mouth.    Marland Kitchen omeprazole (PRILOSEC) 40 MG capsule Take 1 tablet by mouth 30 min before breakfast. 90  capsule 3  . potassium chloride SA (KLOR-CON) 20 MEQ tablet Take 1 tablet (20 mEq total) by mouth 2 (two) times daily. 180 tablet 1  . terbinafine (LAMISIL) 250 MG tablet Take 1 tablet (250 mg total) by mouth daily. 90 tablet 0  . thiamine (VITAMIN B-1) 100 MG tablet Take 1 tablet (100 mg total) by mouth daily. 90 tablet 1  . phentermine 37.5 MG capsule TAKE 1 CAPSULE (37.5 MG TOTAL) BY MOUTH EVERY MORNING. 30 capsule 2   No facility-administered medications prior to visit.    ROS Review of Systems  Constitutional: Positive for unexpected weight change (wt gain). Negative for appetite change, chills, diaphoresis and fatigue.  HENT: Negative.   Eyes: Negative.   Respiratory: Negative for choking and wheezing.   Cardiovascular: Negative for chest pain, palpitations and leg swelling.  Gastrointestinal: Negative for abdominal pain, blood in stool, constipation, diarrhea and nausea.  Endocrine: Negative.   Genitourinary: Negative.  Negative for difficulty urinating.  Musculoskeletal: Positive for arthralgias and back pain.  Skin: Negative.   Neurological: Negative.  Negative for dizziness, weakness, light-headedness, numbness and headaches.  Hematological: Negative for adenopathy. Does not bruise/bleed easily.  Psychiatric/Behavioral: Negative.     Objective:  BP 128/84   Pulse 74   Temp 98 F (36.7 C) (Oral)   Resp 16   Ht 6' 0.5" (1.842 m)   Wt (!) 450 lb (204.1 kg)   SpO2 97%   BMI 60.19 kg/m   BP Readings from Last 3 Encounters:  09/08/20 128/84  07/23/20 114/76  05/29/20 116/64    Wt Readings from Last 3 Encounters:  09/08/20 (!) 450 lb (204.1 kg)  05/29/20 (!) 432 lb (196 kg)  04/27/20 (!) 440 lb (199.6 kg)    Physical Exam Vitals reviewed.  Constitutional:      Appearance: He is obese. He is ill-appearing (in a wheelchair).  HENT:     Nose: Nose normal.     Mouth/Throat:     Mouth: Mucous membranes are moist.  Eyes:     General: No scleral icterus.     Conjunctiva/sclera: Conjunctivae normal.  Cardiovascular:     Rate and Rhythm: Normal rate and regular rhythm.     Heart sounds: No murmur heard.   Pulmonary:     Effort: Pulmonary effort is normal.     Breath sounds: No stridor. No wheezing, rhonchi or rales.  Abdominal:     General: Abdomen is protuberant. Bowel sounds are normal. There is no distension.     Palpations: Abdomen is soft. There is no hepatomegaly, splenomegaly or mass.     Hernia: A hernia is present. Hernia is present in the ventral area.  Musculoskeletal:        General: Normal range of motion.     Cervical back: Neck supple.  Right lower leg: No edema.     Left lower leg: No edema.  Lymphadenopathy:     Cervical: No cervical adenopathy.  Skin:    General: Skin is warm and dry.  Neurological:     General: No focal deficit present.     Mental Status: He is alert. Mental status is at baseline.     Comments: Strength 4/5 in BUE Strenght 4/5 in BLE  Psychiatric:        Mood and Affect: Mood normal.        Behavior: Behavior normal.     Lab Results  Component Value Date   WBC 8.4 07/23/2020   HGB 10.3 (L) 07/23/2020   HCT 32.3 (L) 07/23/2020   PLT 254.0 07/23/2020   GLUCOSE 151 (H) 05/29/2020   CHOL 174 02/13/2020   TRIG 101.0 02/13/2020   HDL 32.50 (L) 02/13/2020   LDLDIRECT 136.8 12/12/2006   LDLCALC 122 (H) 02/13/2020   ALT 20 07/23/2020   AST 22 07/23/2020   NA 140 06/19/2020   K 3.7 06/19/2020   CL 103 06/19/2020   CREATININE 1.8 (A) 06/19/2020   BUN 19 06/19/2020   CO2 26 (A) 06/19/2020   TSH 1.77 07/23/2020   PSA 1.73 02/13/2020   HGBA1C 7.6 (H) 07/23/2020   MICROALBUR 1.0 10/31/2019    VAS [place] LOWER EXTREMITY VENOUS (DVT)  Result Date: 05/29/2020  Lower Venous DVTStudy Indications: Swelling, and Pain.  Limitations: Body habitus, poor ultrasound/tissue interface and study done in wheelchair. Comparison Study: 10/23/17 previous Performing Technologist: Abram Sander RVS  Examination  Guidelines: A complete evaluation includes B-mode imaging, spectral Doppler, color Doppler, and power Doppler as needed of all accessible portions of each vessel. Bilateral testing is considered an integral part of a complete examination. Limited examinations for reoccurring indications may be performed as noted. The reflux portion of the exam is performed with the patient in reverse Trendelenburg.  +---------+---------------+---------+-----------+----------+--------------+ RIGHT    CompressibilityPhasicitySpontaneityPropertiesThrombus Aging +---------+---------------+---------+-----------+----------+--------------+ CFV      Full           Yes      Yes                                 +---------+---------------+---------+-----------+----------+--------------+ SFJ      Full                                                        +---------+---------------+---------+-----------+----------+--------------+ FV Prox  Full                                                        +---------+---------------+---------+-----------+----------+--------------+ FV Mid                  Yes      Yes                                 +---------+---------------+---------+-----------+----------+--------------+ FV Distal               Yes      Yes                                 +---------+---------------+---------+-----------+----------+--------------+  PFV      Full                                                        +---------+---------------+---------+-----------+----------+--------------+ POP      Full           Yes      Yes                                 +---------+---------------+---------+-----------+----------+--------------+ PTV      Full                                                        +---------+---------------+---------+-----------+----------+--------------+ PERO                                                  Not visualized  +---------+---------------+---------+-----------+----------+--------------+   +---------+---------------+---------+-----------+----------+--------------+ LEFT     CompressibilityPhasicitySpontaneityPropertiesThrombus Aging +---------+---------------+---------+-----------+----------+--------------+ CFV      Full           Yes      Yes                                 +---------+---------------+---------+-----------+----------+--------------+ SFJ      Full                                                        +---------+---------------+---------+-----------+----------+--------------+ FV Prox                 Yes      Yes                                 +---------+---------------+---------+-----------+----------+--------------+ FV Mid                  Yes      Yes                                 +---------+---------------+---------+-----------+----------+--------------+ FV Distal               Yes      Yes                                 +---------+---------------+---------+-----------+----------+--------------+ PFV                                                   Continued      +---------+---------------+---------+-----------+----------+--------------+ POP  Yes      Yes                                 +---------+---------------+---------+-----------+----------+--------------+ PTV      Full                                                        +---------+---------------+---------+-----------+----------+--------------+ PERO                                                  Not visualized +---------+---------------+---------+-----------+----------+--------------+     Summary: RIGHT: - There is no evidence of deep vein thrombosis in the lower extremity. However, portions of this examination were limited- see technologist comments above.  - No cystic structure found in the popliteal fossa.  LEFT: - There is no evidence of deep vein  thrombosis in the lower extremity. However, portions of this examination were limited- see technologist comments above.  - No cystic structure found in the popliteal fossa.  *See table(s) above for measurements and observations. Electronically signed by Monica Martinez MD on 05/29/2020 at 4:41:16 PM.    Final     Assessment & Plan:   Harlie was seen today for diabetes, anemia, osteoarthritis and back pain.  Diagnoses and all orders for this visit:  Class 3 obesity with alveolar hypoventilation, serious comorbidity, and body mass index (BMI) of 50.0 to 59.9 in adult [facility])- Scooter evaluation completed.  He is not losing weight with phentermine so I recommended that he discontinue taking it.   I have discontinued [place] Mil[place]n's phentermine. I am also having him maintain his omeprazole, Multiple Vitamins-Minerals (MULTIVITAMIN ADULT PO), blood glucose meter kit and supplies, Restasis, thiamine, UltiCare Short Pen Needles, Accu-Chek Aviva Plus, ferrous sulfate, Synjardy, cyanocobalamin, Cholecalciferol, vitamin A, aspirin EC, docusate sodium, furosemide, HYDROcodone-acetaminophen, ciclopirox, albuterol, Levemir FlexTouch, bisoprolol, Gvoke HypoPen 2-Pack, terbinafine, potassium chloride SA, losartan, losartan, Accu-Chek Softclix Lancets, atorvastatin, and levothyroxine.  No orders of the defined types were placed in this encounter.    Follow-up: No follow-ups on file.  Scarlette Calico, MD

## 2020-09-08 NOTE — Patient Instructions (Signed)
Obesity, Adult Obesity is the condition of having too much total body fat. Being overweight or obese means that your weight is greater than what is considered healthy for your body size. Obesity is determined by a measurement called BMI. BMI is an estimate of body fat and is calculated from height and weight. For adults, a BMI of 30 or higher is considered obese. Obesity can lead to other health concerns and major illnesses, including:  Stroke.  Coronary artery disease (CAD).  Type 2 diabetes.  Some types of cancer, including cancers of the colon, breast, uterus, and gallbladder.  Osteoarthritis.  High blood pressure (hypertension).  High cholesterol.  Sleep apnea.  Gallbladder stones.  Infertility problems. What are the causes? Common causes of this condition include:  Eating daily meals that are high in calories, sugar, and fat.  Being born with genes that may make you more likely to become obese.  Having a medical condition that causes obesity, including: ? Hypothyroidism. ? Polycystic ovarian syndrome (PCOS). ? Binge-eating disorder. ? Cushing syndrome.  Taking certain medicines, such as steroids, antidepressants, and seizure medicines.  Not being physically active (sedentary lifestyle).  Not getting enough sleep.  Drinking high amounts of sugar-sweetened beverages, such as soft drinks. What increases the risk? The following factors may make you more likely to develop this condition:  Having a family history of obesity.  Being a woman of African American descent.  Being a man of Hispanic descent.  Living in an area with limited access to: ? Romilda Garret, recreation centers, or sidewalks. ? Healthy food choices, such as grocery stores and farmers' markets. What are the signs or symptoms? The main sign of this condition is having too much body fat. How is this diagnosed? This condition is diagnosed based on:  Your BMI. If you are an adult with a BMI of 30 or  higher, you are considered obese.  Your waist circumference. This measures the distance around your waistline.  Your skinfold thickness. Your health care provider may gently pinch a fold of your skin and measure it. You may have other tests to check for underlying conditions. How is this treated? Treatment for this condition often includes changing your lifestyle. Treatment may include some or all of the following:  Dietary changes. This may include developing a healthy meal plan.  Regular physical activity. This may include activity that causes your heart to beat faster (aerobic exercise) and strength training. Work with your health care provider to design an exercise program that works for you.  Medicine to help you lose weight if you are unable to lose 1 pound a week after 6 weeks of healthy eating and more physical activity.  Treating conditions that cause the obesity (underlying conditions).  Surgery. Surgical options may include gastric banding and gastric bypass. Surgery may be done if: ? Other treatments have not helped to improve your condition. ? You have a BMI of 40 or higher. ? You have life-threatening health problems related to obesity. Follow these instructions at home: Eating and drinking   Follow recommendations from your health care provider about what you eat and drink. Your health care provider may advise you to: ? Limit fast food, sweets, and processed snack foods. ? Choose low-fat options, such as low-fat milk instead of whole milk. ? Eat 5 or more servings of fruits or vegetables every day. ? Eat at home more often. This gives you more control over what you eat. ? Choose healthy foods when you eat out. ?  Learn to read food labels. This will help you understand how much food is considered 1 serving. ? Learn what a healthy serving size is. ? Keep low-fat snacks available. ? Limit sugary drinks, such as soda, fruit juice, sweetened iced tea, and flavored  milk.  Drink enough water to keep your urine pale yellow.  Do not follow a fad diet. Fad diets can be unhealthy and even dangerous. Physical activity  Exercise regularly, as told by your health care provider. ? Most adults should get up to 150 minutes of moderate-intensity exercise every week. ? Ask your health care provider what types of exercise are safe for you and how often you should exercise.  Warm up and stretch before being active.  Cool down and stretch after being active.  Rest between periods of activity. Lifestyle  Work with your health care provider and a dietitian to set a weight-loss goal that is healthy and reasonable for you.  Limit your screen time.  Find ways to reward yourself that do not involve food.  Do not drink alcohol if: ? Your health care provider tells you not to drink. ? You are pregnant, may be pregnant, or are planning to become pregnant.  If you drink alcohol: ? Limit how much you use to:  0-1 drink a day for women.  0-2 drinks a day for men. ? Be aware of how much alcohol is in your drink. In the [place] one drink equals one 12 oz bottle of beer (355 mL), one 5 oz glass of wine (148 mL), or one 1 oz glass of hard liquor (44 mL). General instructions  Keep a weight-loss journal to keep track of the food you eat and how much exercise you get.  Take over-the-counter and prescription medicines only as told by your health care provider.  Take vitamins and supplements only as told by your health care provider.  Consider joining a support group. Your health care provider may be able to recommend a support group.  Keep all follow-up visits as told by your health care provider. This is important. Contact a health care provider if:  You are unable to meet your weight loss goal after 6 weeks of dietary and lifestyle changes. Get help right away if you are having:  Trouble breathing.  Suicidal thoughts or behaviors. Summary  Obesity is the  condition of having too much total body fat.  Being overweight or obese means that your weight is greater than what is considered healthy for your body size.  Work with your health care provider and a dietitian to set a weight-loss goal that is healthy and reasonable for you.  Exercise regularly, as told by your health care provider. Ask your health care provider what types of exercise are safe for you and how often you should exercise. This information is not intended to replace advice given to you by your health care provider. Make sure you discuss any questions you have with your health care provider. Document Revised: 05/10/2018 Document Reviewed: 05/10/2018 Elsevier Patient Education  2020 [employer]  

## 2020-09-16 ENCOUNTER — Ambulatory Visit: Payer: Medicare Other | Admitting: Podiatry

## 2020-09-22 ENCOUNTER — Other Ambulatory Visit: Payer: Self-pay | Admitting: Internal Medicine

## 2020-09-22 DIAGNOSIS — M8949 Other hypertrophic osteoarthropathy, multiple sites: Secondary | ICD-10-CM

## 2020-09-22 DIAGNOSIS — E1142 Type 2 diabetes mellitus with diabetic polyneuropathy: Secondary | ICD-10-CM

## 2020-09-22 DIAGNOSIS — M159 Polyosteoarthritis, unspecified: Secondary | ICD-10-CM

## 2020-09-22 DIAGNOSIS — M5416 Radiculopathy, lumbar region: Secondary | ICD-10-CM

## 2020-09-22 NOTE — Telephone Encounter (Signed)
Ok to forward to pcp 

## 2020-10-01 ENCOUNTER — Ambulatory Visit: Payer: Medicare Other | Admitting: *Deleted

## 2020-10-06 ENCOUNTER — Other Ambulatory Visit: Payer: Self-pay

## 2020-10-06 DIAGNOSIS — I739 Peripheral vascular disease, unspecified: Secondary | ICD-10-CM

## 2020-10-07 ENCOUNTER — Telehealth: Payer: Self-pay | Admitting: Internal Medicine

## 2020-10-07 ENCOUNTER — Other Ambulatory Visit: Payer: Self-pay | Admitting: *Deleted

## 2020-10-07 NOTE — Patient Instructions (Signed)
Goals Addressed            This Visit's Progress   . [facility]) Learn More About My Health       Timeframe:  Long-Range Goal Priority:  Medium Start Date:     [id number]                        Expected End Date: [id number]                      Follow Up Date [id number]   - tell my story and reason for my visit - make a list of questions - ask questions - repeat what I heard to make sure I understand - bring a list of my medicines to the visit - speak up when I don't understand    Why is this important?   The best way to learn about your health and care is by talking to the doctor and nurse.  They will answer your questions and give you information in the way that you like best.    Notes:  Provided educational material on healthy meal planning    . [facility]) Make and Keep All Appointments   On track    Timeframe:  Long-Range Goal Priority:  Medium Start Date:   [id number]                          Expected End Date:     [id number]                 Follow Up Date 01/16/21   - arrange a ride through an agency 1 week before appointment - ask family or friend for a ride - call to cancel if needed - keep a calendar with appointment dates    Why is this important?   Part of staying healthy is seeing the doctor for follow-up care.  If you forget your appointments, there are some things you can do to stay on track.    Notes:     . [facility]) Monitor and Manage My Blood Sugar   Not on track    Timeframe:  Long-Range Goal Priority:  High Start Date:      [id number]                       Expected End Date:      [id number]                Follow Up Date 01/16/21    - check blood sugar at prescribed times - check blood sugar if I feel it is too high or too low - enter blood sugar readings and medication or insulin into daily log - take the blood sugar log to all doctor visits - take the blood sugar meter to all doctor visits -check blood sugars at least twice a day    Why is this important?   Checking  your blood sugar at home helps to keep it from getting very high or very low.  Writing the results in a diary or log helps the doctor know how to care for you.  Your blood sugar log should have the time, date and the results.  Also, write down the amount of insulin or other medicine that you take.  Other information, like what you ate, exercise done and how you were feeling, will also be helpful.  Notes:  Patient has not checked his blood sugar in several days(Glucometer broken)    . [facility]) Perform Foot Care   On track    Timeframe:  Long-Range Goal Priority:  Medium Start Date:     [id number]                        Expected End Date:   [id number]                   Follow Up Date [id number]   - check feet daily for cuts, sores or redness - keep feet up while sitting - wash and dry feet carefully every day - wear comfortable, cotton socks - wear comfortable, well-fitting shoes -discuss with provider wound center referral for left foot wound -continue to see podiatry for toe nail maintenance     Why is this important?   Good foot care is very important when you have diabetes.  There are many things you can do to keep your feet healthy and catch a problem early.    Notes:  Last POD visit 08/05/2020    . [facility]) Set My Target A1C   On track    Timeframe:  Long-Range Goal Priority:  High Start Date:      [id number]                       Expected End Date:  [id number]                    Follow Up Date [id number]   - set target A1C -discuss with provider target Hgb A1C, current is 7.6    Why is this important?   Your target A1C is decided together by you and your doctor.  It is based on several things like your age and other health issues.    Notes:  Goal 7.0

## 2020-10-07 NOTE — Patient Outreach (Signed)
[facility]) Care Management  [place]  10/07/2020   Perkins Molina May 24, 1956 [id number]  RN Health Coach telephone call to patient.  [employer] compliance verified.:  Per patient he has not checked his blood sugars. He stated that his meter has been broken for a few days. Patient stated that he has been taking his medications but have not checked his blood sugars. Per patient stated his appetite has been good. Patient stated that he went to [facility] and the next day he started feeling bad. Per patient he ran a temp of over 100. His head is congested with coughing and flu like symptoms. RN discussed with patient about getting a COVID test. Patient has agreed to follow up outreach calls.  Encounter Medications:  Outpatient Encounter Medications as of 10/07/2020  Medication Sig  . Accu-Chek Softclix Lancets lancets CHECK BLOOD SUGAR 3 TIMES  DAILY  . albuterol (VENTOLIN HFA) 108 (90 Base) MCG/ACT inhaler USE 2 INHALATIONS BY MOUTH  EVERY 6 HOURS AS NEEDED FOR WHEEZING OR SHORTNESS OF  BREATH  . aspirin EC 81 MG tablet Take 1 tablet (81 mg total) by mouth daily.  Marland Kitchen atorvastatin (LIPITOR) 40 MG tablet TAKE 1 TABLET BY MOUTH  DAILY  . Beta Carotene (VITAMIN A) 25000 UNIT capsule Take 1 capsule (25,000 Units total) by mouth daily.  . bisoprolol (ZEBETA) 10 MG tablet Take 1 tablet (10 mg total) by mouth every evening.  . blood glucose meter kit and supplies KIT Use to check blood sugar three times daily. DX: E11.8  . Cholecalciferol 50 MCG (2000 UT) TABS Take 1 tablet (2,000 Units total) by mouth daily.  . ciclopirox (PENLAC) 8 % solution Apply topically at bedtime. Apply over nail and surrounding skin. Apply daily over previous coat. After seven (7) days, may remove with alcohol and continue cycle.  . cyanocobalamin 2000 MCG tablet Take 1 tablet (2,000 mcg total) by mouth daily.  . cycloSPORINE (RESTASIS) 0.05 % ophthalmic emulsion Place 1 drop into both eyes 2 (two) times daily.  Marland Kitchen  docusate sodium (COLACE) 100 MG capsule Take 100 mg by mouth 2 (two) times daily.  . Empagliflozin-metFORMIN HCl (SYNJARDY) 12.01-999 MG TABS Take 1 tablet by mouth 2 (two) times daily.  . ferrous sulfate (FEROSUL) 325 (65 FE) MG tablet Take 1 tablet (325 mg total) by mouth 2 (two) times daily with a meal.  . furosemide (LASIX) 80 MG tablet Take 120 mg by mouth daily.  . Glucagon (GVOKE HYPOPEN 2-PACK) 1 MG/0.2ML SOAJ Inject 1 Act into the skin daily as needed.  Marland Kitchen glucose blood (ACCU-CHEK AVIVA PLUS) test strip Use to test blood sugar twice daily. DX E11.8  . HYDROcodone-acetaminophen (NORCO) 10-325 MG tablet TAKE 1 TABLET BY MOUTH EVERY 6 HOURS AS NEEDED.  Marland Kitchen insulin detemir (LEVEMIR FLEXTOUCH) 100 UNIT/ML FlexPen Inject 200 Units into the skin daily.  . Insulin Pen Needle (ULTICARE SHORT PEN NEEDLES) 31G X 8 MM MISC USE WITH INSULIN PENS DAILY.  Marland Kitchen levothyroxine (SYNTHROID) 50 MCG tablet TAKE 1 TABLET BY MOUTH  DAILY BEFORE BREAKFAST  . losartan (COZAAR) 25 MG tablet Take 50 mg by mouth daily.  Marland Kitchen losartan (COZAAR) 50 MG tablet Take 1 tablet (50 mg total) by mouth daily.  . Multiple Vitamins-Minerals (MULTIVITAMIN ADULT PO) Take by mouth.  Marland Kitchen omeprazole (PRILOSEC) 40 MG capsule Take 1 tablet by mouth 30 min before breakfast.  . potassium chloride SA (KLOR-CON) 20 MEQ tablet Take 1 tablet (20 mEq total) by mouth 2 (two) times  daily.  . terbinafine (LAMISIL) 250 MG tablet Take 1 tablet (250 mg total) by mouth daily.  Marland Kitchen thiamine (VITAMIN B-1) 100 MG tablet Take 1 tablet (100 mg total) by mouth daily.   No facility-administered encounter medications on file as of 10/07/2020.    Functional Status:  No flowsheet data found.  Fall/Depression Screening: Fall Risk  10/07/2020 04/29/2020 12/10/2019  Falls in the past year? 1 1 1   Comment - - -  Number falls in past yr: 1 1 1   Comment - no recent falls no falls recently  Injury with Fall? 0 0 0  Risk Factor Category  - - -  Comment - - -  Risk for  fall due to : History of fall(s);Impaired balance/gait;Impaired mobility;Medication side effect History of fall(s);Impaired balance/gait;Impaired mobility;Medication side effect History of fall(s);Medication side effect;Impaired mobility;Impaired balance/gait  Risk for fall due to: Comment - - -  Follow up Falls evaluation completed Falls prevention discussed;Education provided;Falls evaluation completed Falls prevention discussed;Education provided;Falls evaluation completed   PHQ 2/9 Scores 02/13/2020 12/26/2018 11/27/2018 11/22/2018 10/17/2017 08/08/2016 02/03/2016  PHQ - 2 Score 0 0 0 0 1 0 0  PHQ- 9 Score - - - - 9 - -    Assessment:  Goals Addressed            This Visit's Progress   . [facility]) Learn More About My Health       Timeframe:  Long-Range Goal Priority:  Medium Start Date:     [id number]                        Expected End Date: [id number]                      Follow Up Date [id number]   - tell my story and reason for my visit - make a list of questions - ask questions - repeat what I heard to make sure I understand - bring a list of my medicines to the visit - speak up when I don't understand    Why is this important?   The best way to learn about your health and care is by talking to the doctor and nurse.  They will answer your questions and give you information in the way that you like best.    Notes:  Provided educational material on healthy meal planning    . [facility]) Make and Keep All Appointments   On track    Timeframe:  Long-Range Goal Priority:  Medium Start Date:   [id number]                          Expected End Date:     [id number]                 Follow Up Date 01/16/21   - arrange a ride through an agency 1 week before appointment - ask family or friend for a ride - call to cancel if needed - keep a calendar with appointment dates    Why is this important?   Part of staying healthy is seeing the doctor for follow-up care.  If you forget your appointments,  there are some things you can do to stay on track.    Notes:     . ([facility]) Monitor and Manage My Blood Sugar   Not on track    Timeframe:  Long-Range Goal Priority:  High Start Date:  [id number]                       Expected End Date:      46503546                Follow Up Date 01/16/21    - check blood sugar at prescribed times - check blood sugar if I feel it is too high or too low - enter blood sugar readings and medication or insulin into daily log - take the blood sugar log to all doctor visits - take the blood sugar meter to all doctor visits -check blood sugars at least twice a day    Why is this important?   Checking your blood sugar at home helps to keep it from getting very high or very low.  Writing the results in a diary or log helps the doctor know how to care for you.  Your blood sugar log should have the time, date and the results.  Also, write down the amount of insulin or other medicine that you take.  Other information, like what you ate, exercise done and how you were feeling, will also be helpful.     Notes:  Patient has not checked his blood sugar in several days(Glucometer broken)    . [facility]) Perform Foot Care   On track    Timeframe:  Long-Range Goal Priority:  Medium Start Date:     [id number]                        Expected End Date:   70017494                   Follow Up Date [id number]   - check feet daily for cuts, sores or redness - keep feet up while sitting - wash and dry feet carefully every day - wear comfortable, cotton socks - wear comfortable, well-fitting shoes -discuss with provider wound center referral for left foot wound -continue to see podiatry for toe nail maintenance     Why is this important?   Good foot care is very important when you have diabetes.  There are many things you can do to keep your feet healthy and catch a problem early.    Notes:  Last POD visit 08/05/2020    . [facility]) Set My Target A1C   On track    Timeframe:   Long-Range Goal Priority:  High Start Date:      [id number]                       Expected End Date:  35701779                    Follow Up Date [id number]   - set target A1C -discuss with provider target Hgb A1C, current is 7.6    Why is this important?   Your target A1C is decided together by you and your doctor.  It is based on several things like your age and other health issues.    Notes:  Goal 7.0       Plan:  Follow-up:  Patient agrees to Care Plan and Follow-up. RN instructed patient to get a COVID test Provided education on Healthy meal planning Provided a calendar book for documentation RN called Dr office for new meter, strips and lancets Provided education on healthy fast food Provided educational material on appropriate diabetic drinks RN  will follow up within the month of March RN sent update assessment to PCP  [facility] [phone]

## 2020-10-07 NOTE — Telephone Encounter (Signed)
[name] w/ [facility] called and is requesting a call back. She can be reached at [phone].

## 2020-10-08 ENCOUNTER — Telehealth: Payer: Self-pay | Admitting: Internal Medicine

## 2020-10-08 DIAGNOSIS — E118 Type 2 diabetes mellitus with unspecified complications: Secondary | ICD-10-CM

## 2020-10-08 NOTE — Telephone Encounter (Signed)
Symptoms started on Saturday 1/15. Patient had a fever until Sunday morning.   Migraine, headache, fatigue, afebrile. Pt stated that he is having sob and dysnea.   Pt is interested in the infusion and will wait for the COVID results and call us back.   Pt given precaution on when to call for immediate medical attention. Pt stated understanding.

## 2020-10-08 NOTE — Telephone Encounter (Signed)
° ° °  Per [facility] patient needs new glucometer, test strips and lancets  Pharmacy [place], [place]

## 2020-10-08 NOTE — Telephone Encounter (Signed)
LVM for [name] to call back!

## 2020-10-08 NOTE — Telephone Encounter (Signed)
    Patient calling for advice He states he feels he has COVID, declined to go to testing site at [facility] because he would have to get out of the car. Patient offered information on getting testing at another site. He declined  Patient went on to ask if "he could die from [place]" with the symptoms he is having Advised patient to call 911. He declined  He never verbalized what his symptoms were other than fatigue. Offered triage nurse, patient declined, only wants to speak with Dr [name] team  Please call

## 2020-10-09 NOTE — Telephone Encounter (Signed)
Tried to call pt.   Line was busy.   We need to know what the name of the glucometer that is covered by insurance so that we can send that type in.

## 2020-10-14 ENCOUNTER — Telehealth: Payer: Self-pay | Admitting: Internal Medicine

## 2020-10-14 DIAGNOSIS — E118 Type 2 diabetes mellitus with unspecified complications: Secondary | ICD-10-CM

## 2020-10-14 DIAGNOSIS — I5032 Chronic diastolic (congestive) heart failure: Secondary | ICD-10-CM

## 2020-10-14 DIAGNOSIS — E662 Morbid (severe) obesity with alveolar hypoventilation: Secondary | ICD-10-CM

## 2020-10-14 MED ORDER — BLOOD GLUCOSE MONITOR KIT: PACK | 0 refills | Status: DC

## 2020-10-14 NOTE — Telephone Encounter (Signed)
[facility] calling, wondering if we could send in a continuous glucose monitor for the patient. His insurance covers the [place], [address], [address] Phone:  [phone]  Fax:  [phone]

## 2020-10-14 NOTE — Telephone Encounter (Signed)
[employer] called and was wondering if an order for an  electric scooter could be placed. The woman at [facility] said it would be through [place]. Please advise.    Phone: [phone] Fax: [phone]

## 2020-10-15 NOTE — Telephone Encounter (Signed)
   To obtain approval for FreeStyle Libre please call [facility] at [phone]

## 2020-10-15 NOTE — Telephone Encounter (Signed)
How do we do this?

## 2020-10-16 NOTE — Telephone Encounter (Signed)
I have contacted [facility] as stated below. However, they do not have the patient listed in their system.   The new Rx for Freestyle Elenor Legato was sent to [facility] per their request on 10/14/20

## 2020-10-19 MED ORDER — BLOOD GLUCOSE MONITOR KIT: PACK | 0 refills | Status: DC

## 2020-10-19 NOTE — Telephone Encounter (Signed)
I have all necessary paperwork printed and ready to be signed.  Packet has been placed on PCPs desk to sign upon his return to the office.

## 2020-10-19 NOTE — Addendum Note (Signed)
Addended by: Hinda Kehr on: 10/19/2020 11:03 AM   Modules accepted: Orders

## 2020-10-19 NOTE — Telephone Encounter (Signed)
[employer] called and was wondering if the rx for FreeStyle Libre could be sent to   [place], [address], [address] Phone:  [phone]  Fax:  [phone]

## 2020-10-19 NOTE — Telephone Encounter (Signed)
LVM for [name] with [employer]

## 2020-10-19 NOTE — Addendum Note (Signed)
Addended by: Hinda Kehr on: 10/19/2020 04:31 PM   Modules accepted: Orders

## 2020-10-19 NOTE — Telephone Encounter (Signed)
Rx has been sent to [facility]. 

## 2020-10-19 NOTE — Addendum Note (Signed)
Addended by: Aviva Signs M on: 10/19/2020 10:39 AM   Modules accepted: Orders

## 2020-10-19 NOTE — Telephone Encounter (Signed)
Fax: [phone]   To [place] for electric scooter has been pended to this encounter.  Sign order and it will print automatically.  OV notes from the 12.21.21 will need to be included along with the demographic and insurance information printed and sent with it.

## 2020-10-20 ENCOUNTER — Inpatient Hospital Stay (HOSPITAL_COMMUNITY): Admission: RE | Admit: 2020-10-20 | Payer: Medicare Other | Source: Ambulatory Visit

## 2020-10-20 ENCOUNTER — Encounter: Payer: Medicare Other | Admitting: Vascular Surgery

## 2020-10-21 MED ORDER — BLOOD GLUCOSE MONITOR KIT: PACK | 0 refills | Status: DC

## 2020-10-21 MED ORDER — ACCU-CHEK SOFTCLIX LANCETS MISC: 3 refills | Status: DC

## 2020-10-21 MED ORDER — ACCU-CHEK AVIVA PLUS VI STRP: ORAL_STRIP | 3 refills | Status: AC

## 2020-10-21 NOTE — Telephone Encounter (Signed)
Forms have been signed and faxed back.

## 2020-10-21 NOTE — Telephone Encounter (Signed)
Spoke to pt and he stated that he did not want to try to use Freestyle. He did not want to pay more for the glucometer than he has to. Pt stated he wanted to stay with the [employer] meter that he is used.   Erx has been sent in.

## 2020-10-21 NOTE — Telephone Encounter (Signed)
[name] called me back, I informed of the order for the electric scooter and about it being faxed to [facility]. She stated that they are apart of [place] now but the service the [place] part of [place].   Jackelyn Poling stated that she can pull the order in Epic. She may need a PT eval signed for pt to have a PT evaluation done, but she will fax that if needed.

## 2020-10-21 NOTE — Telephone Encounter (Signed)
Noted  

## 2020-11-04 ENCOUNTER — Telehealth: Payer: Self-pay | Admitting: Internal Medicine

## 2020-11-04 NOTE — Telephone Encounter (Signed)
Patient dropped off DMV forms &Parking Placard. Patient aware provider is out of office until 11/09/2020.

## 2020-11-04 NOTE — Telephone Encounter (Signed)
Forms have been completed and Placed in providers box to review and sign.

## 2020-11-06 ENCOUNTER — Ambulatory Visit (INDEPENDENT_AMBULATORY_CARE_PROVIDER_SITE_OTHER): Payer: Medicare Other

## 2020-11-06 ENCOUNTER — Other Ambulatory Visit: Payer: Self-pay

## 2020-11-06 VITALS — BP 120/60 | HR 66 | Temp 98.2°F | Ht 73.0 in | Wt >= 6400 oz

## 2020-11-06 DIAGNOSIS — Z0000 Encounter for general adult medical examination without abnormal findings: Secondary | ICD-10-CM | POA: Diagnosis not present

## 2020-11-06 NOTE — Patient Instructions (Signed)
[name] , Thank you for taking time to come for your Medicare Wellness Visit. I appreciate your ongoing commitment to your health goals. Please review the following plan we discussed and let me know if I can assist you in the future.   Screening recommendations/referrals: Colonoscopy: 07/11/2017; due every 10 years Recommended yearly ophthalmology/optometry visit for glaucoma screening and checkup Recommended yearly dental visit for hygiene and checkup  Vaccinations: Influenza vaccine: 05/31/2020 Pneumococcal vaccine: up to date Tdap vaccine: 06/23/2011; due 06/2021 Shingles vaccine: never done   Covid-19: up to date  Advanced directives: Please bring a copy of your health care power of attorney and living will to the office at your convenience.  Conditions/risks identified: Yes; Reviewed health maintenance screenings with patient today and relevant education, vaccines, and/or referrals were provided. Please continue to do your personal lifestyle choices by: daily care of teeth and gums, regular physical activity (goal should be 5 days a week for 30 minutes), eat a healthy diet, avoid tobacco and drug use, limiting any alcohol intake, taking a low-dose aspirin (if not allergic or have been advised by your provider otherwise) and taking vitamins and minerals as recommended by your provider. Continue doing brain stimulating activities (puzzles, reading, adult coloring books, staying active) to keep memory sharp. Continue to eat heart healthy diet (full of fruits, vegetables, whole grains, lean protein, water--limit salt, fat, and sugar intake) and increase physical activity as tolerated.  Next appointment: Please schedule your next Medicare Wellness Visit with your Nurse Health Advisor in 1 year by calling [phone].  Preventive Care 40-64 Years, Male Preventive care refers to lifestyle choices and visits with your health care provider that can promote health and wellness. What does  preventive care include?  A yearly physical exam. This is also called an annual well check.  Dental exams once or twice a year.  Routine eye exams. Ask your health care provider how often you should have your eyes checked.  Personal lifestyle choices, including:  Daily care of your teeth and gums.  Regular physical activity.  Eating a healthy diet.  Avoiding tobacco and drug use.  Limiting alcohol use.  Practicing safe sex.  Taking low-dose aspirin every day starting at age 66. What happens during an annual well check? The services and screenings done by your health care provider during your annual well check will depend on your age, overall health, lifestyle risk factors, and family history of disease. Counseling  Your health care provider may ask you questions about your:  Alcohol use.  Tobacco use.  Drug use.  Emotional well-being.  Home and relationship well-being.  Sexual activity.  Eating habits.  Work and work Statistician. Screening  You may have the following tests or measurements:  Height, weight, and BMI.  Blood pressure.  Lipid and cholesterol levels. These may be checked every 5 years, or more frequently if you are over 58 years old.  Skin check.  Lung cancer screening. You may have this screening every year starting at age 64 if you have a 30-pack-year history of smoking and currently smoke or have quit within the past 15 years.  Fecal occult blood test (FOBT) of the stool. You may have this test every year starting at age 80.  Flexible sigmoidoscopy or colonoscopy. You may have a sigmoidoscopy every 5 years or a colonoscopy every 10 years starting at age 3.  Prostate cancer screening. Recommendations will vary depending on your family history and other risks.  Hepatitis C blood test.  Hepatitis  B blood test.  Sexually transmitted disease (STD) testing.  Diabetes screening. This is done by checking your blood sugar (glucose) after you  have not eaten for a while (fasting). You may have this done every 1-3 years. Discuss your test results, treatment options, and if necessary, the need for more tests with your health care provider. Vaccines  Your health care provider may recommend certain vaccines, such as:  Influenza vaccine. This is recommended every year.  Tetanus, diphtheria, and acellular pertussis (Tdap, Td) vaccine. You may need a Td booster every 10 years.  Zoster vaccine. You may need this after age 35.  Pneumococcal 13-valent conjugate (PCV13) vaccine. You may need this if you have certain conditions and have not been vaccinated.  Pneumococcal polysaccharide (PPSV23) vaccine. You may need one or two doses if you smoke cigarettes or if you have certain conditions. Talk to your health care provider about which screenings and vaccines you need and how often you need them. This information is not intended to replace advice given to you by your health care provider. Make sure you discuss any questions you have with your health care provider. Document Released: 10/02/2015 Document Revised: 05/25/2016 Document Reviewed: 07/07/2015 Elsevier Interactive Patient Education  2017 [place] Prevention in the Home Falls can cause injuries. They can happen to people of all ages. There are many things you can do to make your home safe and to help prevent falls. What can I do on the outside of my home?  Regularly fix the edges of walkways and driveways and fix any cracks.  Remove anything that might make you trip as you walk through a door, such as a raised step or threshold.  Trim any bushes or trees on the path to your home.  Use bright outdoor lighting.  Clear any walking paths of anything that might make someone trip, such as rocks or tools.  Regularly check to see if handrails are loose or broken. Make sure that both sides of any steps have handrails.  Any raised decks and porches should have guardrails on  the edges.  Have any leaves, snow, or ice cleared regularly.  Use sand or salt on walking paths during winter.  Clean up any spills in your garage right away. This includes oil or grease spills. What can I do in the bathroom?  Use night lights.  Install grab bars by the toilet and in the tub and shower. Do not use towel bars as grab bars.  Use non-skid mats or decals in the tub or shower.  If you need to sit down in the shower, use a plastic, non-slip stool.  Keep the floor dry. Clean up any water that spills on the floor as soon as it happens.  Remove soap buildup in the tub or shower regularly.  Attach bath mats securely with double-sided non-slip rug tape.  Do not have throw rugs and other things on the floor that can make you trip. What can I do in the bedroom?  Use night lights.  Make sure that you have a light by your bed that is easy to reach.  Do not use any sheets or blankets that are too big for your bed. They should not hang down onto the floor.  Have a firm chair that has side arms. You can use this for support while you get dressed.  Do not have throw rugs and other things on the floor that can make you trip. What can I do in  the kitchen?  Clean up any spills right away.  Avoid walking on wet floors.  Keep items that you use a lot in easy-to-reach places.  If you need to reach something above you, use a strong step stool that has a grab bar.  Keep electrical cords out of the way.  Do not use floor polish or wax that makes floors slippery. If you must use wax, use non-skid floor wax.  Do not have throw rugs and other things on the floor that can make you trip. What can I do with my stairs?  Do not leave any items on the stairs.  Make sure that there are handrails on both sides of the stairs and use them. Fix handrails that are broken or loose. Make sure that handrails are as long as the stairways.  Check any carpeting to make sure that it is firmly  attached to the stairs. Fix any carpet that is loose or worn.  Avoid having throw rugs at the top or bottom of the stairs. If you do have throw rugs, attach them to the floor with carpet tape.  Make sure that you have a light switch at the top of the stairs and the bottom of the stairs. If you do not have them, ask someone to add them for you. What else can I do to help prevent falls?  Wear shoes that:  Do not have high heels.  Have rubber bottoms.  Are comfortable and fit you well.  Are closed at the toe. Do not wear sandals.  If you use a stepladder:  Make sure that it is fully opened. Do not climb a closed stepladder.  Make sure that both sides of the stepladder are locked into place.  Ask someone to hold it for you, if possible.  Clearly mark and make sure that you can see:  Any grab bars or handrails.  First and last steps.  Where the edge of each step is.  Use tools that help you move around (mobility aids) if they are needed. These include:  Canes.  Walkers.  Scooters.  Crutches.  Turn on the lights when you go into a dark area. Replace any light bulbs as soon as they burn out.  Set up your furniture so you have a clear path. Avoid moving your furniture around.  If any of your floors are uneven, fix them.  If there are any pets around you, be aware of where they are.  Review your medicines with your doctor. Some medicines can make you feel dizzy. This can increase your chance of falling. Ask your doctor what other things that you can do to help prevent falls. This information is not intended to replace advice given to you by your health care provider. Make sure you discuss any questions you have with your health care provider. Document Released: 07/02/2009 Document Revised: 02/11/2016 Document Reviewed: 10/10/2014 Elsevier Interactive Patient Education  2017 [employer].

## 2020-11-06 NOTE — Progress Notes (Signed)
Subjective:   [name] is a 65 y.o. male who presents for Medicare Annual/Subsequent preventive examination.  Review of Systems    No ROS. Medicare Wellness Visit. Additional risk factors are reflected in social history. Cardiac Risk Factors include: advanced age (>53mn, >>1women);diabetes mellitus;dyslipidemia;family history of premature cardiovascular disease;hypertension;male gender;obesity (BMI >30kg/m2)     Objective:    Today's Vitals   11/06/20 1108  BP: 120/60  Pulse: 66  Temp: 98.2 F (36.8 C)  SpO2: 99%  Weight: (!) 450 lb (204.1 kg)  Height: _0  (1.854 m)  PainSc: 7    Body mass index is 59.37 kg/m.  Advanced Directives 11/06/2020 11/07/2019 12/26/2018 11/27/2018 07/11/2017 07/07/2017 08/08/2016  Does Patient Have a Medical Advance Directive? Yes Yes No Yes;No No Yes No  Type of Advance Directive - [place] - - - [place] will -  Does patient want to make changes to medical advance directive? No - Patient declined No - Patient declined Yes (MAU/Ambulatory/Procedural Areas - Information given) - - - -  Copy of [place] Chart? - No - copy requested - - - No - copy requested -  Would patient like information on creating a medical advance directive? - - - - - - Yes - Educational materials given  Pre-existing out of facility DNR order (yellow form or pink MOST form) - - - - - - -    Current Medications (verified) Outpatient Encounter Medications as of 11/06/2020  Medication Sig  . Empagliflozin-metFORMIN HCl (SYNJARDY) 12.01-999 MG TABS Take 1 tablet by mouth 2 (two) times daily.  . Accu-Chek Softclix Lancets lancets CHECK BLOOD SUGAR 2 TIMES  DAILY  . albuterol (VENTOLIN HFA) 108 (90 Base) MCG/ACT inhaler USE 2 INHALATIONS BY MOUTH  EVERY 6 HOURS AS NEEDED FOR WHEEZING OR SHORTNESS OF  BREATH  . aspirin EC 81 MG tablet Take 1 tablet (81 mg total) by mouth daily.  .Marland Kitchenatorvastatin  (LIPITOR) 40 MG tablet TAKE 1 TABLET BY MOUTH  DAILY  . Beta Carotene (VITAMIN A) 25000 UNIT capsule Take 1 capsule (25,000 Units total) by mouth daily. (Patient not taking: Reported on 11/06/2020)  . bisoprolol (ZEBETA) 10 MG tablet Take 1 tablet (10 mg total) by mouth every evening.  . blood glucose meter kit and supplies KIT Use to check blood sugar three times daily. Dispense Freestyle Libre according to insurance preference. DX: E11.8 (Patient not taking: Reported on 11/06/2020)  . Cholecalciferol 50 MCG (2000 UT) TABS Take 1 tablet (2,000 Units total) by mouth daily.  . ciclopirox (PENLAC) 8 % solution Apply topically at bedtime. Apply over nail and surrounding skin. Apply daily over previous coat. After seven (7) days, may remove with alcohol and continue cycle.  . cyanocobalamin 2000 MCG tablet Take 1 tablet (2,000 mcg total) by mouth daily.  . cycloSPORINE (RESTASIS) 0.05 % ophthalmic emulsion Place 1 drop into both eyes 2 (two) times daily.  .Marland Kitchendocusate sodium (COLACE) 100 MG capsule Take 100 mg by mouth 2 (two) times daily. (Patient not taking: Reported on 11/06/2020)  . ferrous sulfate (FEROSUL) 325 (65 FE) MG tablet Take 1 tablet (325 mg total) by mouth 2 (two) times daily with a meal.  . furosemide (LASIX) 80 MG tablet Take 120 mg by mouth daily.  . Glucagon (GVOKE HYPOPEN 2-PACK) 1 MG/0.2ML SOAJ Inject 1 Act into the skin daily as needed.  .Marland Kitchenglucose blood (ACCU-CHEK AVIVA PLUS) test strip Use to test  blood sugar twice daily. DX E11.8  . HYDROcodone-acetaminophen (NORCO) 10-325 MG tablet TAKE 1 TABLET BY MOUTH EVERY 6 HOURS AS NEEDED.  Marland Kitchen insulin detemir (LEVEMIR FLEXTOUCH) 100 UNIT/ML FlexPen Inject 200 Units into the skin daily.  . Insulin Pen Needle (ULTICARE SHORT PEN NEEDLES) 31G X 8 MM MISC USE WITH INSULIN PENS DAILY.  Marland Kitchen levothyroxine (SYNTHROID) 50 MCG tablet TAKE 1 TABLET BY MOUTH  DAILY BEFORE BREAKFAST  . losartan (COZAAR) 25 MG tablet Take 50 mg by mouth daily. (Patient not  taking: Reported on 11/06/2020)  . losartan (COZAAR) 50 MG tablet Take 1 tablet (50 mg total) by mouth daily.  . Multiple Vitamins-Minerals (MULTIVITAMIN ADULT PO) Take by mouth.  Marland Kitchen omeprazole (PRILOSEC) 40 MG capsule Take 1 tablet by mouth 30 min before breakfast.  . potassium chloride SA (KLOR-CON) 20 MEQ tablet Take 1 tablet (20 mEq total) by mouth 2 (two) times daily.  Marland Kitchen terbinafine (LAMISIL) 250 MG tablet Take 1 tablet (250 mg total) by mouth daily. (Patient not taking: Reported on 11/06/2020)  . thiamine (VITAMIN B-1) 100 MG tablet Take 1 tablet (100 mg total) by mouth daily.   No facility-administered encounter medications on file as of 11/06/2020.    Allergies (verified) Shellfish allergy, Lisinopril, and Lovastatin   History: Past Medical History:  Diagnosis Date  . Anemia    iron  . CHF (congestive heart failure) (HCC)    Presumed diastolic. Echo (06/07) w.EF 45%, severe posterior HK, mild LV hypertrophy, No further work-up of abnormal echo was done. pt denies CHF  . Colon cancer ([place]) 2007   s/p sigmoid colectomy  . Diabetes mellitus    Has Hx of diabetic foot ulcer & peripheral neuropathy  type2  . Dyspnea    w/ activity  . History of PFTs 05/2009   Mild Obstructive defect  . HTN (hypertension)   . Hyperlipidemia   . Morbid obesity ([place])   . OSA on CPAP   . Pneumonia    week ago    Past Surgical History:  Procedure Laterality Date  . BARIATRIC SURGERY  12/2009   Lap Band/At [facility]  . COLONOSCOPY  multiple  . COLONOSCOPY WITH PROPOFOL N/A 11/26/2012   Procedure: COLONOSCOPY WITH PROPOFOL;  Surgeon: Gatha Mayer, MD;  Location: [facility];  Service: Endoscopy;  Laterality: N/A;  may need pre appt. with anesthesia due to morbid obesity  . COLONOSCOPY WITH PROPOFOL N/A 07/11/2017   Procedure: COLONOSCOPY WITH PROPOFOL;  Surgeon: Gatha Mayer, MD;  Location: [facility];  Service: Endoscopy;  Laterality: N/A;  . [place]   '80/Right   '84/Left  . EYE SURGERY Right 05/2017  . HERNIA REPAIR    . HIP SURGERY     bilateral  . INCISIONAL HERNIA REPAIR N/A 12/18/2013   Procedure:  REPAIR OF INCARCERATED INCISIONAL HERNIA;  Surgeon: Rolm Bookbinder, MD;  Location: [place];  Service: General;  Laterality: N/A;  . INSERTION OF MESH N/A 12/18/2013   Procedure: INSERTION OF MESH;  Surgeon: Rolm Bookbinder, MD;  Location: [place];  Service: General;  Laterality: N/A;  . Sigmoid Colectomy  10/2005   [place]  . TONSILLECTOMY     Family History  Problem Relation Age of Onset  . Hepatitis Mother        hepatitis C  . Diabetes Mother   . Congestive Heart Failure Mother   . CAD Mother   . Heart attack Brother 85  . Diabetes Father   . Heart  failure Brother   . Stomach cancer Neg Hx   . Colon cancer Neg Hx    Social History   Socioeconomic History  . Marital status: Legally Separated    Spouse name: Not on file  . Number of children: 0  . Years of education: Not on file  . Highest education level: Not on file  Occupational History  . Occupation: Human resources officer  Tobacco Use  . Smoking status: Never Smoker  . Smokeless tobacco: Never Used  Vaping Use  . Vaping Use: Never used  Substance and Sexual Activity  . Alcohol use: Not Currently    Alcohol/week: 0.0 standard drinks    Comment: states he does not drink   . Drug use: No  . Sexual activity: Not Currently  Other Topics Concern  . 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   In new relationship (04/2009) - girlfriend helps taste for cooking business and helps monitor for ulcers.   Regular Exercise- No      Social Determinants of Health   Financial Resource Strain: Low Risk   . Difficulty of Paying Living Expenses: Not hard at all  Food  Insecurity: No Food Insecurity  . Worried About Charity fundraiser in the Last Year: Never true  . Ran Out of Food in the Last Year: Never true  Transportation Needs: No Transportation Needs  . Lack of Transportation (Medical): No  . Lack of Transportation (Non-Medical): No  Physical Activity: Inactive  . Days of Exercise per Week: 0 days  . Minutes of Exercise per Session: 0 min  Stress: Stress Concern Present  . Feeling of Stress : To some extent  Social Connections: Socially Isolated  . Frequency of Communication with Friends and Family: More than three times a week  . Frequency of Social Gatherings with Friends and Family: Once a week  . Attends Religious Services: Never  . Active Member of Clubs or Organizations: No  . Attends Archivist Meetings: Never  . Marital Status: Never married    Tobacco Counseling Counseling given: Not Answered   Clinical Intake:  Pre-visit preparation completed: Yes  Pain : 0-10 Pain Score: 7  Pain Type: Chronic pain Pain Location: Abdomen Pain Orientation: Left,Lower (history of hiatal hernia) Pain Radiating Towards: towards left side of back Pain Descriptors / Indicators: Throbbing,Discomfort,Cramping Pain Onset: More than a month ago Pain Frequency: Intermittent Pain Relieving Factors: Hydrocodone Effect of Pain on Daily Activities: Pain produces disability and affects the quality of life.  Pain Relieving Factors: Hydrocodone  BMI - recorded: 59.37 Nutritional Status: BMI > 30  Obese Nutritional Risks: Nausea/ vomitting/ diarrhea (diarrhea with abdominal pain) Diabetes: No  How often do you need to have someone help you when you read instructions, pamphlets, or other written materials from your doctor or pharmacy?: 1 - Never What is the last grade level you completed in school?: 2 years of college  Diabetic? yes  Interpreter Needed?: No  Information entered by :: Cali Hope N. Emre Stock, LPN   Activities of Daily  Living In your present state of health, do you have any difficulty performing the following activities: 11/06/2020  Hearing? N  Vision? N  Difficulty concentrating or making decisions? N  Walking or climbing stairs? Y  Dressing or bathing? N  Doing errands, shopping? N  Preparing Food and eating ? N  Using the Toilet? N  In the past six months, have you accidently leaked urine? N  Do you have problems with loss of bowel control? N  Managing your Medications? N  Managing your Finances? N  Housekeeping or managing your Housekeeping? N  Some recent data might be hidden    Patient Care Team: [name], MD as PCP - General (Internal Medicine) Larey Dresser, MD as PCP - Advanced Heart Failure (Cardiology) Gatha Mayer, MD (Gastroenterology) [place], Eppie Gibson, RN as [place] any recent [place] you may have received from other than [facility] providers in the past year (date may be approximate).     Assessment:   This is a routine wellness examination for Abrahim.  Hearing/Vision screen No exam data present  Dietary issues and exercise activities discussed: Current Exercise Habits: The patient does not participate in regular exercise at present, Exercise limited by: cardiac condition(s);orthopedic condition(s);respiratory conditions(s)  Goals    . [facility]) Learn More About My Health     Timeframe:  Long-Range Goal Priority:  Medium Start Date:     [id number]                        Expected End Date: [id number]                      Follow Up Date [id number]   - tell my story and reason for my visit - make a list of questions - ask questions - repeat what I heard to make sure I understand - bring a list of my medicines to the visit - speak up when I don't understand    Why is this important?   The best way to learn about your health and care is by talking to the doctor and nurse.  They will answer your questions and give you  information in the way that you like best.    Notes:  Provided educational material on healthy meal planning    . [facility]) Make and Keep All Appointments     Timeframe:  Long-Range Goal Priority:  Medium Start Date:   [id number]                          Expected End Date:     [id number]                 Follow Up Date 01/16/21   - arrange a ride through an agency 1 week before appointment - ask family or friend for a ride - call to cancel if needed - keep a calendar with appointment dates    Why is this important?   Part of staying healthy is seeing the doctor for follow-up care.  If you forget your appointments, there are some things you can do to stay on track.    Notes:     . [facility]) Monitor and Manage My Blood Sugar     Timeframe:  Long-Range Goal Priority:  High Start Date:      [id number]                       Expected End Date:      [id number]  Follow Up Date 01/16/21    - check blood sugar at prescribed times - check blood sugar if I feel it is too high or too low - enter blood sugar readings and medication or insulin into daily log - take the blood sugar log to all doctor visits - take the blood sugar meter to all doctor visits -check blood sugars at least twice a day    Why is this important?   Checking your blood sugar at home helps to keep it from getting very high or very low.  Writing the results in a diary or log helps the doctor know how to care for you.  Your blood sugar log should have the time, date and the results.  Also, write down the amount of insulin or other medicine that you take.  Other information, like what you ate, exercise done and how you were feeling, will also be helpful.     Notes:  Patient has not checked his blood sugar in several days(Glucometer broken)    . [facility]) Perform Foot Care     Timeframe:  Long-Range Goal Priority:  Medium Start Date:     17001749                        Expected End Date:   [id number]                    Follow Up Date 63846659   - check feet daily for cuts, sores or redness - keep feet up while sitting - wash and dry feet carefully every day - wear comfortable, cotton socks - wear comfortable, well-fitting shoes -discuss with provider wound center referral for left foot wound -continue to see podiatry for toe nail maintenance     Why is this important?   Good foot care is very important when you have diabetes.  There are many things you can do to keep your feet healthy and catch a problem early.    Notes:  Last POD visit 08/05/2020    . [facility]) Set My Target A1C     Timeframe:  Long-Range Goal Priority:  High Start Date:      93570177                       Expected End Date:  93903009                    Follow Up Date 23300762   - set target A1C -discuss with provider target Hgb A1C, current is 7.6    Why is this important?   Your target A1C is decided together by you and your doctor.  It is based on several things like your age and other health issues.    Notes:  Goal 7.0    . Exercise 150 minutes per week (moderate activity)     Goal is to start journaling; food and BS and the amount of insulin Start at the [facility] for pool exercise / 4 times a week ( currently stopped) Weights on 4th day Smoothies with protein q day; juicing   Goal is 300 lbs       Depression Screen PHQ 2/9 Scores 11/06/2020 02/13/2020 12/26/2018 11/27/2018 11/22/2018 10/17/2017 08/08/2016  PHQ - 2 Score 0 0 0 0 0 1 0  PHQ- 9 Score - - - - - 9 -    Fall Risk Fall Risk  11/06/2020 10/07/2020 04/29/2020 12/10/2019 11/07/2019  Falls in the past year? 0 _0 Comment - - - - -  Number falls in past yr: 0 _1 Comment - - no recent falls no falls recently -  Injury with Fall? 0 0 0 0 0  Risk Factor Category  - - - - -  Comment - - - - -  Risk for fall due to : No Fall Risks History of fall(s);Impaired balance/gait;Impaired mobility;Medication side effect History of fall(s);Impaired  balance/gait;Impaired mobility;Medication side effect History of fall(s);Medication side effect;Impaired mobility;Impaired balance/gait History of fall(s);Medication side effect;Impaired balance/gait;Impaired mobility  Risk for fall due to: Comment - - - - -  Follow up - Falls evaluation completed Falls prevention discussed;Education provided;Falls evaluation completed Falls prevention discussed;Education provided;Falls evaluation completed Falls prevention discussed;Education provided;Falls evaluation completed    FALL RISK PREVENTION PERTAINING TO THE HOME:  Any stairs in or around the home? No  If so, are there any without handrails? No  Home free of loose throw rugs in walkways, pet beds, electrical cords, etc? Yes  Adequate lighting in your home to reduce risk of falls? Yes   ASSISTIVE DEVICES UTILIZED TO PREVENT FALLS:  Life alert? No  Use of a cane, walker or w/c? Yes  Grab bars in the bathroom? No  Shower chair or bench in shower? Yes  Elevated toilet seat or a handicapped toilet? No   TIMED UP AND GO:  Was the test performed? No .  Length of time to ambulate 10 feet: 0 sec.   Gait steady and fast with assistive device (Patient was in a motorized wheelchair)  Cognitive Function: Normal cognitive status assessed by direct observation by this Nurse Health Advisor. No abnormalities found.   MMSE - Mini Mental State Exam 02/05/2015  Not completed: (No Data)        Immunizations Immunization History  Administered Date(s) Administered  . Influenza-Unspecified 05/31/2020  . PFIZER(Purple Top)SARS-COV-2 Vaccination 12/14/2019, 12/30/2019  . Pneumococcal Conjugate-13 11/22/2018  . Pneumococcal Polysaccharide-23 03/24/2014, 04/27/2020  . Tdap 06/23/2011    TDAP status: Up to date  Flu Vaccine status: Up to date  Pneumococcal vaccine status: Up to date  Covid-19 vaccine status: Completed vaccines  Qualifies for Shingles Vaccine? Yes   Zostavax completed No    Shingrix Completed?: No.    Education has been provided regarding the importance of this vaccine. Patient has been advised to call insurance company to determine out of pocket expense if they have not yet received this vaccine. Advised may also receive vaccine at local pharmacy or Health Dept. Verbalized acceptance and understanding.  Screening Tests Health Maintenance  Topic Date Due  . COVID-19 Vaccine (3 - Inadvertent risk 4-dose series) 01/27/2020  . HEMOGLOBIN A1C  01/20/2021  . OPHTHALMOLOGY EXAM  02/11/2021  . FOOT EXAM  02/12/2021  . TETANUS/TDAP  06/22/2021  . COLONOSCOPY (Pts 45-11yr Insurance coverage will need to be confirmed)  07/12/2027  . INFLUENZA VACCINE  Completed  . PNEUMOCOCCAL POLYSACCHARIDE VACCINE AGE 25-64 HIGH RISK  Completed  . Hepatitis C Screening  Completed  . HIV Screening  Completed    Health Maintenance  Health Maintenance Due  Topic Date Due  . COVID-19 Vaccine (3 - Inadvertent risk 4-dose series) 01/27/2020    Colorectal cancer screening: Type of screening: Colonoscopy. Completed 07/11/2017. Repeat every 10 years  Lung Cancer Screening: (Low Dose CT Chest recommended if Age 63-80years, 30 pack-year currently smoking OR have quit w/in 15years.) does not qualify.  Lung Cancer Screening Referral: no  Additional Screening:  Hepatitis C Screening: does qualify; Completed yes  Vision Screening: Recommended annual ophthalmology exams for early detection of glaucoma and other disorders of the eye. Is the patient up to date with their annual eye exam?  Yes  Who is the provider or what is the name of the office in which the patient attends annual eye exams? Marshall Cork, MD. If pt is not established with a provider, would they like to be referred to a provider to establish care? No .   Dental Screening: Recommended annual dental exams for proper oral hygiene  Community Resource Referral / Chronic Care Management: CRR required this visit?  No    CCM required this visit?  No      Plan:     I have personally reviewed and noted the following in the patient's chart:   . Medical and social history . Use of alcohol, tobacco or illicit drugs  . Current medications and supplements . Functional ability and status . Nutritional status . Physical activity . Advanced directives . List of other physicians . Hospitalizations, surgeries, and ER visits in previous 12 months . Vitals . Screenings to include cognitive, depression, and falls . Referrals and appointments  In addition, I have reviewed and discussed with patient certain preventive protocols, quality metrics, and best practice recommendations. A written personalized care plan for preventive services as well as general preventive health recommendations were provided to patient.     Sheral Flow, LPN   3/56/8616   Nurse Notes:  Medications reviewed with patient; opioid use noted.

## 2020-11-09 ENCOUNTER — Other Ambulatory Visit: Payer: Self-pay | Admitting: Internal Medicine

## 2020-11-09 DIAGNOSIS — B351 Tinea unguium: Secondary | ICD-10-CM

## 2020-11-09 DIAGNOSIS — K5904 Chronic idiopathic constipation: Secondary | ICD-10-CM | POA: Insufficient documentation

## 2020-11-09 DIAGNOSIS — E118 Type 2 diabetes mellitus with unspecified complications: Secondary | ICD-10-CM

## 2020-11-09 DIAGNOSIS — E1165 Type 2 diabetes mellitus with hyperglycemia: Secondary | ICD-10-CM

## 2020-11-09 DIAGNOSIS — M8949 Other hypertrophic osteoarthropathy, multiple sites: Secondary | ICD-10-CM

## 2020-11-09 DIAGNOSIS — M159 Polyosteoarthritis, unspecified: Secondary | ICD-10-CM

## 2020-11-09 MED ORDER — ACCU-CHEK AVIVA CONNECT W/DEVICE KIT: 1.0000 | PACK | Freq: Three times a day (TID) | 3 refills | Status: DC | PRN

## 2020-11-09 MED ORDER — CICLOPIROX 8 % EX SOLN
Freq: Every day | CUTANEOUS | 0 refills | Status: DC
Start: 2020-11-09 — End: 2021-08-10

## 2020-11-10 DIAGNOSIS — E1140 Type 2 diabetes mellitus with diabetic neuropathy, unspecified: Secondary | ICD-10-CM | POA: Diagnosis not present

## 2020-11-10 DIAGNOSIS — L98491 Non-pressure chronic ulcer of skin of other sites limited to breakdown of skin: Secondary | ICD-10-CM | POA: Diagnosis not present

## 2020-11-10 DIAGNOSIS — M150 Primary generalized (osteo)arthritis: Secondary | ICD-10-CM | POA: Diagnosis not present

## 2020-11-10 DIAGNOSIS — R609 Edema, unspecified: Secondary | ICD-10-CM | POA: Diagnosis not present

## 2020-11-10 NOTE — Telephone Encounter (Signed)
Forms have been signed, Copy sent to scan.  Patient informed and will pick up original.

## 2020-11-11 NOTE — Telephone Encounter (Signed)
Patient dropped out of classes when he had covid and he wants a note to give to the school so he can be re-admitted

## 2020-11-13 ENCOUNTER — Encounter: Payer: Self-pay | Admitting: Podiatry

## 2020-11-13 ENCOUNTER — Ambulatory Visit (INDEPENDENT_AMBULATORY_CARE_PROVIDER_SITE_OTHER): Payer: Medicare Other | Admitting: Podiatry

## 2020-11-13 ENCOUNTER — Other Ambulatory Visit: Payer: Self-pay

## 2020-11-13 ENCOUNTER — Other Ambulatory Visit: Payer: Medicare Other

## 2020-11-13 DIAGNOSIS — M79674 Pain in right toe(s): Secondary | ICD-10-CM | POA: Diagnosis not present

## 2020-11-13 DIAGNOSIS — M79675 Pain in left toe(s): Secondary | ICD-10-CM | POA: Diagnosis not present

## 2020-11-13 DIAGNOSIS — E1142 Type 2 diabetes mellitus with diabetic polyneuropathy: Secondary | ICD-10-CM

## 2020-11-13 DIAGNOSIS — M2041 Other hammer toe(s) (acquired), right foot: Secondary | ICD-10-CM

## 2020-11-13 DIAGNOSIS — L84 Corns and callosities: Secondary | ICD-10-CM

## 2020-11-13 DIAGNOSIS — M2011 Hallux valgus (acquired), right foot: Secondary | ICD-10-CM

## 2020-11-13 DIAGNOSIS — M2012 Hallux valgus (acquired), left foot: Secondary | ICD-10-CM

## 2020-11-13 DIAGNOSIS — M2042 Other hammer toe(s) (acquired), left foot: Secondary | ICD-10-CM

## 2020-11-13 DIAGNOSIS — B351 Tinea unguium: Secondary | ICD-10-CM | POA: Diagnosis not present

## 2020-11-15 NOTE — Progress Notes (Signed)
Subjective: [name] presents with h/o diabetes and diabetic neuropathy. He presents for preventative diabetic and cc of painful, discolored, thick toenails and painful calluses and corn which interfere with activities of daily living. Pain is aggravated when wearing enclosed shoe gear. Pain is relieved with periodic professional debridement.  [name], MD is his PCP. Last visit 09/08/2020. He is requesting diabetic shoes on today's visit.  Allergies  Allergen Reactions  . Shellfish Allergy Anaphylaxis  . Lisinopril Cough  . Lovastatin Itching   Objective: There were no vitals filed for this visit.  Vascular Examination: Capillary refill time immediate b/l.  Dorsalis pedis pulses palpable b/l.  Posterior tibial pulses palpable b/l.  Digital hair absent b/l.  Skin temperature gradient WNL b/l.  Dermatological Examination: Skin with normal turgor, texture and tone b/l.  Toenails 1-5 b/l discolored, thick, dystrophic with subungual debris and pain with palpation to nailbeds due to thickness of nails.  Hyperkeratotic lesion(s) sub hallux left foot, submet head 1 left foot and left 5th PIPJ. No erythema, no edema, no drainage, no flocculence noted.  Musculoskeletal: Muscle strength 5/5 to all LE muscle groups.  Hallux hammertoe right foot. HAV with bunion deformity b/l.  Utilized motorized chair for mobility assistance.  Neurological: Sensation diminished with 10 gram monofilament bilaterally.  Vibratory sensation diminished bilaterally.  Assessment: 1. Painful onychomycosis toenails 1-5 b/l 2. Calluses 1 left foot, left hallux   3. Corn left 5th digit 4. NIDDM with neuropathy   Plan: 1. Continue diabetic foot care principles. Will have [employer] call him and schedule him for diabetic shoe measurements. 2. Toenails 1-5 b/l were debrided in length and girth without iatrogenic bleeding. 3. Calluses pared submetatarsal head(s) 1 left foot and left hallux  utilizing sterile scalpel blade without incident.  4. Corn(s) left 5th digit pared utilizing sterile scalpel blade without incident.  5. Patient to continue soft, supportive shoe gear. 6. Patient to report any pedal injuries to medical professional. 7. Follow up 3 months.  8. Patient/POA to call should there be a concern in the interim.

## 2020-11-18 ENCOUNTER — Ambulatory Visit: Payer: Medicare Other | Admitting: Internal Medicine

## 2020-11-18 ENCOUNTER — Telehealth: Payer: Self-pay | Admitting: Internal Medicine

## 2020-11-18 NOTE — Telephone Encounter (Signed)
Patient states he got some box in the mail about taking his blood and he doesn't know what it is and if we sent it so he wanted to talk to someone

## 2020-11-19 NOTE — Telephone Encounter (Signed)
Called pt, LVM.   Looked through the pts chart and I do not see where anything fitting this description was ordered by PCP.

## 2020-11-24 ENCOUNTER — Telehealth (HOSPITAL_COMMUNITY): Payer: Self-pay | Admitting: Licensed Clinical Social Worker

## 2020-11-24 ENCOUNTER — Ambulatory Visit (HOSPITAL_COMMUNITY)
Admission: RE | Admit: 2020-11-24 | Discharge: 2020-11-24 | Disposition: A | Discharge status: Home / Self Care | Payer: Medicare Other | Source: Ambulatory Visit | Attending: Adult Health | Admitting: Adult Health

## 2020-11-24 ENCOUNTER — Encounter (HOSPITAL_COMMUNITY): Payer: Self-pay

## 2020-11-24 ENCOUNTER — Other Ambulatory Visit: Payer: Self-pay

## 2020-11-24 VITALS — BP 148/92 | HR 66 | Wt >= 6400 oz

## 2020-11-24 DIAGNOSIS — I130 Hypertensive heart and chronic kidney disease with heart failure and stage 1 through stage 4 chronic kidney disease, or unspecified chronic kidney disease: Secondary | ICD-10-CM | POA: Diagnosis not present

## 2020-11-24 DIAGNOSIS — N183 Chronic kidney disease, stage 3 unspecified: Secondary | ICD-10-CM | POA: Diagnosis not present

## 2020-11-24 DIAGNOSIS — I5032 Chronic diastolic (congestive) heart failure: Secondary | ICD-10-CM

## 2020-11-24 DIAGNOSIS — Z9884 Bariatric surgery status: Secondary | ICD-10-CM | POA: Insufficient documentation

## 2020-11-24 DIAGNOSIS — Z79899 Other long term (current) drug therapy: Secondary | ICD-10-CM | POA: Diagnosis not present

## 2020-11-24 DIAGNOSIS — Z8249 Family history of ischemic heart disease and other diseases of the circulatory system: Secondary | ICD-10-CM | POA: Diagnosis not present

## 2020-11-24 DIAGNOSIS — Z6841 Body Mass Index (BMI) 40.0 and over, adult: Secondary | ICD-10-CM | POA: Diagnosis not present

## 2020-11-24 DIAGNOSIS — N1831 Chronic kidney disease, stage 3a: Secondary | ICD-10-CM | POA: Diagnosis not present

## 2020-11-24 DIAGNOSIS — E785 Hyperlipidemia, unspecified: Secondary | ICD-10-CM | POA: Insufficient documentation

## 2020-11-24 DIAGNOSIS — I10 Essential (primary) hypertension: Secondary | ICD-10-CM

## 2020-11-24 DIAGNOSIS — E1122 Type 2 diabetes mellitus with diabetic chronic kidney disease: Secondary | ICD-10-CM | POA: Diagnosis not present

## 2020-11-24 DIAGNOSIS — E1142 Type 2 diabetes mellitus with diabetic polyneuropathy: Secondary | ICD-10-CM | POA: Insufficient documentation

## 2020-11-24 DIAGNOSIS — G4733 Obstructive sleep apnea (adult) (pediatric): Secondary | ICD-10-CM | POA: Insufficient documentation

## 2020-11-24 DIAGNOSIS — Z7982 Long term (current) use of aspirin: Secondary | ICD-10-CM | POA: Insufficient documentation

## 2020-11-24 DIAGNOSIS — Z794 Long term (current) use of insulin: Secondary | ICD-10-CM | POA: Diagnosis not present

## 2020-11-24 LAB — BASIC METABOLIC PANEL
Anion gap: 5 (ref 5–15)
BUN: 27 mg/dL — ABNORMAL HIGH (ref 8–23)
CO2: 28 mmol/L (ref 22–32)
Calcium: 8.7 mg/dL — ABNORMAL LOW (ref 8.9–10.3)
Chloride: 107 mmol/L (ref 98–111)
Creatinine, Ser: 1.78 mg/dL — ABNORMAL HIGH (ref 0.61–1.24)
GFR, Estimated: 42 mL/min — ABNORMAL LOW (ref >60)
Glucose, Bld: 49 mg/dL — ABNORMAL LOW (ref 70–99)
Potassium: 4.1 mmol/L (ref 3.5–5.1)
Sodium: 140 mmol/L (ref 135–145)

## 2020-11-24 MED ORDER — FUROSEMIDE 80 MG PO TABS: 120.0000 mg | ORAL_TABLET | Freq: Every day | ORAL | Status: DC

## 2020-11-24 NOTE — Telephone Encounter (Signed)
CSW consulted to help pt obtain a scale.  CSW called pt and confirmed his home address.  Explained we would be able to send out a bariatric scale that talks so he wouldn't have to see the numbers on the scale.  Pt agreeable to plan and thinks this will work for him.  Scale ordered and shipped to home.  Will continue to follow and assist as needed  [name], [facility] Desk#: [phone] Cell#: [phone]

## 2020-11-24 NOTE — Progress Notes (Signed)
ID:  [name], DOB 12-11-1955, MRN [id number]   Provider location: [facility] Type of Visit: Established patient   PCP:  [name] Lima, MD  Cardiologist:  Dr. Aundra [name]  Chief Complaint: Heart Failure    History of Present Illness: [name] is a 65 y.o. male who has history of morbid obesity, diabetes II, and diastolic CHF. S/P Lap band  procedure years ago.   Last seen in 2021. No recent hospitalization.   Today he returns for HF follow up.Overall feeling fine. SOB after walking in his house. Says he is limited by joint pain. Uses an electric wheel chair.  Denies PND/Orthopnea. Appetite ok. No fever or chills. He has not been weighing because he doesn't have a scale. Says he has gained a lot of weight during the pandemic.  He just started exercising again at [facility].  Taking all medications but did not take meds this morning. Lives alone. Drives to appointments or uses [facility].    Labs (8/10): creatinine 1.1, LDL 94, HDL 34 Labs (8/11): K 4.8, creatinine 1.2, LDL 93, HDL 33 Labs (10/12): K 5.4, creatinine 1.5, LDL 95, HDL 42 Labs (8/14): LDL 115, HDL 42 Labs (9/14): BNP 68 Labs (10/14): K 4.5, creatinine 1.6 Labs (7/15): K 4.4, creatinine 1.3 Labs (8/15): LDL 136, HDL 37 Labs (11/17): K 4.6, creatinine 1.43, LDL 111, HDL 37 Labs (4/18): K 4.9, creatinine 1.35, hgb 10.5 Labs (1/19): creatinine 2.56 Labs (3/20): K 4, creatinine 1.88, hgb 10.2, HIV negative  Labs (7/20): K 4.7, creatinine 2.46 Labs (5/21): K 4, creatinine 2.04, LDL 122 Labs (06/2020): Creatinine 1.8   Past Medical History: 1. Diabetes mellitus.  Has history of diabetic foot ulcer and diabetic peripheral neuropathy. .  2. Hyperlipidemia 3. Hypertension: ACEI cough.  4. Colon cancer: s/p sigmoid colectomy in 2007 5. Morbid obesity s/p lap band procedure.  6. Anemia 7. Obstructive Sleep Apnea: Using CPAP.  8. CHF: Diastolic.  Echo (5/12): EF 50-55%, mild LV dilation, mild LVH,  moderate (grade II) diastolic dysfunction.  Echo (9/14) with EF 50-55%, mild LVH, moderately dilated LV, RV poorly visualized. - Echo (6/19): EF 55-60%, RV poorly visualized.  9. PFTs (9/10): mild obstructive defect 10.  Dobutamine stress echo (9/10): EF 55%, mild LV hypertrophy, no significant valvular disease, no ischemia or infarction  11. CKD stage 3 12. Ventral hernia s/p repair.   Current Outpatient Medications  Medication Sig Dispense Refill  . Accu-Chek Softclix Lancets lancets CHECK BLOOD SUGAR 2 TIMES  DAILY 300 each 3  . albuterol (VENTOLIN HFA) 108 (90 Base) MCG/ACT inhaler USE 2 INHALATIONS BY MOUTH  EVERY 6 HOURS AS NEEDED FOR WHEEZING OR SHORTNESS OF  BREATH 34 g 3  . aspirin EC 81 MG tablet Take 1 tablet (81 mg total) by mouth daily. 90 tablet 1  . atorvastatin (LIPITOR) 40 MG tablet TAKE 1 TABLET BY MOUTH  DAILY 90 tablet 1  . bisoprolol (ZEBETA) 10 MG tablet Take 1 tablet (10 mg total) by mouth every evening. 90 tablet 1  . blood glucose meter kit and supplies KIT Use to check blood sugar three times daily. Dispense Freestyle Libre according to insurance preference. DX: E11.8 2 each 0  . Blood Glucose Monitoring Suppl (ACCU-CHEK AVIVA CONNECT) w/Device KIT 1 Act by Does not apply route 3 (three) times daily as needed. 1 kit 3  . Blood Glucose Monitoring Suppl (ACCU-CHEK GUIDE) w/Device KIT     . Cholecalciferol 50 MCG (2000  UT) TABS Take 1 tablet (2,000 Units total) by mouth daily. 90 tablet 1  . ciclopirox (PENLAC) 8 % solution Apply topically at bedtime. Apply over nail and surrounding skin. Apply daily over previous coat. After seven (7) days, may remove with alcohol and continue cycle. 6.6 mL 0  . cyanocobalamin 2000 MCG tablet Take 1 tablet (2,000 mcg total) by mouth daily. 90 tablet 1  . cycloSPORINE (RESTASIS) 0.05 % ophthalmic emulsion Place 1 drop into both eyes 2 (two) times daily. 22 each 1  . docusate sodium (COLACE) 100 MG capsule Take 100 mg by mouth 2 (two) times  daily.    . Empagliflozin-metFORMIN HCl (SYNJARDY) 12.01-999 MG TABS Take 1 tablet by mouth 2 (two) times daily. 180 tablet 1  . ferrous sulfate (FEROSUL) 325 (65 FE) MG tablet Take 1 tablet (325 mg total) by mouth 2 (two) times daily with a meal. 180 tablet 1  . furosemide (LASIX) 80 MG tablet Take 120 mg by mouth daily.    . Glucagon (GVOKE HYPOPEN 2-PACK) 1 MG/0.2ML SOAJ Inject 1 Act into the skin daily as needed. 2 mL 5  . glucose blood (ACCU-CHEK AVIVA PLUS) test strip Use to test blood sugar twice daily. DX E11.8 200 strip 3  . HYDROcodone-acetaminophen (NORCO) 10-325 MG tablet TAKE 1 TABLET BY MOUTH EVERY 6 HOURS AS NEEDED. 60 tablet 0  . insulin detemir (LEVEMIR FLEXTOUCH) 100 UNIT/ML FlexPen Inject 200 Units into the skin daily. 225 mL 1  . Insulin Pen Needle (ULTICARE SHORT PEN NEEDLES) 31G X 8 MM MISC USE WITH INSULIN PENS DAILY. 100 each 3  . levothyroxine (SYNTHROID) 50 MCG tablet TAKE 1 TABLET BY MOUTH  DAILY BEFORE BREAKFAST 90 tablet 1  . losartan (COZAAR) 50 MG tablet Take 1 tablet (50 mg total) by mouth daily. 90 tablet 1  . Multiple Vitamins-Minerals (MULTIVITAMIN ADULT PO) Take by mouth.    Marland Kitchen omeprazole (PRILOSEC) 40 MG capsule Take 1 tablet by mouth 30 min before breakfast. 90 capsule 3  . potassium chloride SA (KLOR-CON) 20 MEQ tablet Take 1 tablet (20 mEq total) by mouth 2 (two) times daily. 180 tablet 1  . thiamine (VITAMIN B-1) 100 MG tablet Take 1 tablet (100 mg total) by mouth daily. 90 tablet 1   No current facility-administered medications for this encounter.    Allergies:   Shellfish allergy, Lisinopril, and Lovastatin   Social History:  The patient  reports that he has never smoked. He has never used smokeless tobacco. He reports previous alcohol use. He reports that he does not use drugs.   Family History:  The patient's family history includes CAD in his mother; Congestive Heart Failure in his mother; Diabetes in his father and mother; Heart attack (age of  onset: 19) in his brother; Heart failure in his brother; Hepatitis in his mother.   ROS:  Please see the history of present illness.   All other systems are personally reviewed and negative.   Exam:   BP (!) 148/92   Pulse 66   Wt (!) 206.7 kg (455 lb 9.6 oz)   SpO2 98%   BMI 60.11 kg/m  General:  No resp difficulty. Arrived in an electric wheelchair.  HEENT: normal Neck: supple. no JVD. Carotids 2+ bilat; no bruits. No lymphadenopathy or thryomegaly appreciated. Cor: PMI nondisplaced. Regular rate & rhythm. No rubs, gallops or murmurs. Lungs: clear Abdomen: obese, soft, nontender, nondistended. Has hernia. No hepatosplenomegaly. No bruits or masses. Good bowel sounds. Extremities: no cyanosis, clubbing,  rash, RLE/LLE trace edema Neuro: alert & orientedx3, cranial nerves grossly intact. moves all 4 extremities w/o difficulty. Affect pleasant   Recent Labs: 06/19/2020: BUN 19; Creatinine 1.8; Potassium 3.7; Sodium 140 07/23/2020: ALT 20; Hemoglobin 10.3; Platelets 254.0; TSH 1.77  Personally reviewed   Wt Readings from Last 3 Encounters:  11/24/20 (!) 206.7 kg (455 lb 9.6 oz)  11/06/20 (!) 204.1 kg (450 lb)  09/08/20 (!) 204.1 kg (450 lb)     ASSESSMENT AND PLAN:  1. Chronic diastolic CHF:  - ECHO 7159 EF 55-60%   - NYHA III. Volume status stable.  - Continue Lasix 120 mg daily.   - Continue KCl 20 bid.  - Discussed low salt food choices and limiting fluid intake < 2 liters daily.  - Check BMET  2. Obesity: Body mass index is 60.11 kg/m.  Had Lap band but regained weight during COVID. - Discussed portion control. He has started swimming again.   3. HTN: Elevated today but has not had meds. Looking back BP has been stable. No change for now.  4. OSA: Intolerant CPAP   5. CKD: Stage 3.  Creatinine baseline ~1.8  - Check BMET today.  - Needs to avoid NSAIDs, discussed.   Referred to HF SW to help with scale that would safe to stand on and with a number he can see.    Follow up in 6 months.   Jeanmarie Hubert, NP  11/24/2020   [facility] [address] [facility] [place] [address] [phone] (office) [phone] (fax)

## 2020-11-24 NOTE — Patient Instructions (Signed)
Can take an extra 40 mg (1/2 tab) of Furosemide AS NEEDED   Labs done today, we will call you for abnormal results  Please call our office in August 2022 to schedule your follow up appointment  If you have any questions or concerns before your next appointment please send [place] a message through [place] or call our office at [phone].    TO LEAVE A MESSAGE FOR THE NURSE SELECT OPTION 2, PLEASE LEAVE A MESSAGE INCLUDING: . YOUR NAME . DATE OF BIRTH . CALL BACK NUMBER . REASON FOR CALL**this is important as we prioritize the call backs  [place] AS LONG AS YOU CALL BEFORE 4:00 PM  At the [facility], you and your health needs are our priority. As part of our continuing mission to provide you with exceptional heart care, we have created designated Provider Care Teams. These Care Teams include your primary Cardiologist (physician) and Advanced Practice Providers (APPs- Physician Assistants and Nurse Practitioners) who all work together to provide you with the care you need, when you need it.   You may see any of the following providers on your designated Care Team at your next follow up: Marland Kitchen Dr Glori Bickers . Dr [name] . Dr Vickki Muff . Darrick Grinder, NP . Lyda Jester, [place] . Audry Riles, PharmD   Please be sure to bring in all your medications bottles to every appointment.

## 2020-12-01 ENCOUNTER — Other Ambulatory Visit: Payer: Self-pay | Admitting: Internal Medicine

## 2020-12-01 DIAGNOSIS — E118 Type 2 diabetes mellitus with unspecified complications: Secondary | ICD-10-CM

## 2020-12-04 ENCOUNTER — Other Ambulatory Visit: Payer: Self-pay | Admitting: *Deleted

## 2020-12-04 ENCOUNTER — Encounter: Payer: Self-pay | Admitting: Internal Medicine

## 2020-12-04 ENCOUNTER — Ambulatory Visit (INDEPENDENT_AMBULATORY_CARE_PROVIDER_SITE_OTHER): Payer: Medicare Other | Admitting: Internal Medicine

## 2020-12-04 ENCOUNTER — Other Ambulatory Visit: Payer: Self-pay

## 2020-12-04 ENCOUNTER — Telehealth: Payer: Self-pay | Admitting: Internal Medicine

## 2020-12-04 VITALS — BP 110/60 | HR 72

## 2020-12-04 DIAGNOSIS — K5904 Chronic idiopathic constipation: Secondary | ICD-10-CM

## 2020-12-04 DIAGNOSIS — K439 Ventral hernia without obstruction or gangrene: Secondary | ICD-10-CM

## 2020-12-04 DIAGNOSIS — K5903 Drug induced constipation: Secondary | ICD-10-CM

## 2020-12-04 NOTE — Progress Notes (Signed)
  Chronic Care Management   Note  12/04/2020 Name: [name] MRN: [id number] DOB: 23-Jun-1956  [name] is a 65 y.o. year old male who is a primary care patient of [name], MD. I reached out to [employer] by phone today in response to a referral sent by [name]'s PCP, [name], MD.   [name] was given information about Chronic Care Management services today including:  1. CCM service includes personalized support from designated clinical staff supervised by his physician, including individualized plan of care and coordination with other care providers 2. 24/7 contact phone numbers for assistance for urgent and routine care needs. 3. Service will only be billed when office clinical staff spend 20 minutes or more in a month to coordinate care. 4. Only one practitioner may furnish and bill the service in a calendar month. 5. The patient may stop CCM services at any time (effective at the end of the month) by phone call to the office staff.   Patient agreed to services and verbal consent obtained.   Follow up plan:   Carley Perdue [employer] Scheduler

## 2020-12-04 NOTE — Patient Instructions (Signed)
The hydrocodone and iron are probably making constipation worse.  As we discussed - try taking MiraLax 1 dose every day and a dulcolax every other night.  I appreciate the opportunity to care for you. Gatha Mayer, MD, [name]

## 2020-12-04 NOTE — Progress Notes (Signed)
[name] 65 y.o. 03-18-1956 [id number]  Assessment & Plan:   Encounter Diagnoses  Name Primary?  . Chronic idiopathic constipation Yes  . Therapeutic opioid-induced constipation (OIC)   . Ventral hernia without obstruction or gangrene    Tried MiraLAX every day and Dulcolax every other day to promote more regular bowel habits.  I think he has chronic constipation aggravated by opioid use.  He is to return in May for follow-up.  I do not think this represents a significant change to necessitate colonoscopy earlier than planned but I do want to follow him up to make sure he responds to the medication.  I appreciate the opportunity to care for this patient. CC: [name], MD    Subjective:   Chief Complaint: Constipation  HPI [name] is a morbidly obese African-American man here in a motorized scooter, he has a history of chronic constipation, colon cancer status post resection and colon polyps.  Last colonoscopy in 2018, October of that year, showing hemorrhoids and otherwise normal post resection colonoscopy with plans for repeat around October 2023.  He says he has been having more difficulty defecating since he has been on regular hydrocodone for extremity pain.  He notices that when he gets bloated his hernia protrudes.  It never gets hard.  He has tried some Dulcolax and that does tend to make him move his bowels.  He is here seeking advice on how to better regulate his bowel movements.  He has not tried fiber or other medications including MiraLAX. Allergies  Allergen Reactions  . Shellfish Allergy Anaphylaxis  . Lisinopril Cough  . Lovastatin Itching   Current Meds  Medication Sig  . Accu-Chek Softclix Lancets lancets CHECK BLOOD SUGAR 2 TIMES  DAILY  . albuterol (VENTOLIN HFA) 108 (90 Base) MCG/ACT inhaler USE 2 INHALATIONS BY MOUTH  EVERY 6 HOURS AS NEEDED FOR WHEEZING OR SHORTNESS OF  BREATH  . aspirin EC 81 MG tablet Take 1 tablet (81 mg total) by mouth  daily.  Marland Kitchen atorvastatin (LIPITOR) 40 MG tablet TAKE 1 TABLET BY MOUTH  DAILY  . bisoprolol (ZEBETA) 10 MG tablet Take 1 tablet (10 mg total) by mouth every evening.  . blood glucose meter kit and supplies KIT Use to check blood sugar three times daily. Dispense Freestyle Libre according to insurance preference. DX: E11.8  . Blood Glucose Monitoring Suppl (ACCU-CHEK AVIVA CONNECT) w/Device KIT 1 Act by Does not apply route 3 (three) times daily as needed.  . Blood Glucose Monitoring Suppl (ACCU-CHEK GUIDE) w/Device KIT   . Cholecalciferol 50 MCG (2000 UT) TABS Take 1 tablet (2,000 Units total) by mouth daily.  . ciclopirox (PENLAC) 8 % solution Apply topically at bedtime. Apply over nail and surrounding skin. Apply daily over previous coat. After seven (7) days, may remove with alcohol and continue cycle.  . cyanocobalamin 2000 MCG tablet Take 1 tablet (2,000 mcg total) by mouth daily.  . cycloSPORINE (RESTASIS) 0.05 % ophthalmic emulsion Place 1 drop into both eyes 2 (two) times daily.  Marland Kitchen docusate sodium (COLACE) 100 MG capsule Take 100 mg by mouth 2 (two) times daily.  . Empagliflozin-metFORMIN HCl (SYNJARDY) 12.01-999 MG TABS Take 1 tablet by mouth 2 (two) times daily.  . ferrous sulfate (FEROSUL) 325 (65 FE) MG tablet Take 1 tablet (325 mg total) by mouth 2 (two) times daily with a meal.  . furosemide (LASIX) 80 MG tablet Take 1.5 tablets (120 mg total) by mouth daily. Take extra 0.5 tab as  needed  . Glucagon (GVOKE HYPOPEN 2-PACK) 1 MG/0.2ML SOAJ Inject 1 Act into the skin daily as needed.  Marland Kitchen glucose blood (ACCU-CHEK AVIVA PLUS) test strip Use to test blood sugar twice daily. DX E11.8  . HYDROcodone-acetaminophen (NORCO) 10-325 MG tablet TAKE 1 TABLET BY MOUTH EVERY 6 HOURS AS NEEDED.  Marland Kitchen Insulin Pen Needle (ULTICARE SHORT PEN NEEDLES) 31G X 8 MM MISC USE WITH INSULIN PENS DAILY.  Marland Kitchen LEVEMIR FLEXTOUCH 100 UNIT/ML FlexPen INJECT 200 UNITS INTO THE SKIN DAILY.  Marland Kitchen levothyroxine (SYNTHROID) 50 MCG  tablet TAKE 1 TABLET BY MOUTH  DAILY BEFORE BREAKFAST  . losartan (COZAAR) 50 MG tablet Take 1 tablet (50 mg total) by mouth daily.  . Multiple Vitamins-Minerals (MULTIVITAMIN ADULT PO) Take by mouth.  Marland Kitchen omeprazole (PRILOSEC) 40 MG capsule Take 1 tablet by mouth 30 min before breakfast.  . potassium chloride SA (KLOR-CON) 20 MEQ tablet Take 1 tablet (20 mEq total) by mouth 2 (two) times daily.  Marland Kitchen thiamine (VITAMIN B-1) 100 MG tablet Take 1 tablet (100 mg total) by mouth daily.   Past Medical History:  Diagnosis Date  . Anemia    iron  . CHF (congestive heart failure) (HCC)    Presumed diastolic. Echo (06/07) w.EF 45%, severe posterior HK, mild LV hypertrophy, No further work-up of abnormal echo was done. pt denies CHF  . Colon cancer ([place]) 2007   s/p sigmoid colectomy  . Diabetes mellitus    Has Hx of diabetic foot ulcer & peripheral neuropathy  type2  . Dyspnea    w/ activity  . History of PFTs 05/2009   Mild Obstructive defect  . HTN (hypertension)   . Hyperlipidemia   . Morbid obesity ([place])   . OSA on CPAP   . Pneumonia    week ago    Past Surgical History:  Procedure Laterality Date  . BARIATRIC SURGERY  12/2009   Lap Band/At [facility]  . COLONOSCOPY  multiple  . COLONOSCOPY WITH PROPOFOL N/A 11/26/2012   Procedure: COLONOSCOPY WITH PROPOFOL;  Surgeon: Gatha Mayer, MD;  Location: [facility];  Service: Endoscopy;  Laterality: N/A;  may need pre appt. with anesthesia due to morbid obesity  . COLONOSCOPY WITH PROPOFOL N/A 07/11/2017   Procedure: COLONOSCOPY WITH PROPOFOL;  Surgeon: Gatha Mayer, MD;  Location: [facility];  Service: Endoscopy;  Laterality: N/A;  . [place]   '80/Right  '84/Left  . EYE SURGERY Right 05/2017  . HERNIA REPAIR    . HIP SURGERY     bilateral  . INCISIONAL HERNIA REPAIR N/A 12/18/2013   Procedure:  REPAIR OF INCARCERATED INCISIONAL HERNIA;  Surgeon: Rolm Bookbinder, MD;  Location: [facility];  Service: General;  Laterality:  N/A;  . INSERTION OF MESH N/A 12/18/2013   Procedure: INSERTION OF MESH;  Surgeon: Rolm Bookbinder, MD;  Location: [facility];  Service: General;  Laterality: N/A;  . Sigmoid Colectomy  10/2005   [facility]  . 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   In new relationship (04/2009) - girlfriend helps taste for cooking business and helps monitor for ulcers.   Regular Exercise- No      family history  includes CAD in his mother; Congestive Heart Failure in his mother; Diabetes in his father and mother; Heart attack (age of onset: 41) in his brother; Heart failure in his brother; Hepatitis in his mother.   Review of Systems As above  Objective:   Physical Exam BP 110/60 (BP Location: Left Wrist, Patient Position: Sitting, Cuff Size: Normal)   Pulse 58  Morbidly obese black man in a motorized scooter no acute distress The abdomen is very obese with a 5 cm soft reducible ventral hernia above umbilicus  His size precludes rectal exam

## 2020-12-04 NOTE — Patient Outreach (Signed)
[facility]) Care Management  12/04/2020  [name] 1956/06/06 [id number]   RN Health Coach attempted follow up outreach call to patient.  Patient was not home. RN spoke with sister and left [employer] compliance voicemail for return call back..  Plan: RN will call patient again within 30 days.  [place] Care Management [phone]

## 2020-12-08 ENCOUNTER — Other Ambulatory Visit: Payer: Self-pay

## 2020-12-08 ENCOUNTER — Encounter: Payer: Self-pay | Admitting: Internal Medicine

## 2020-12-08 ENCOUNTER — Ambulatory Visit (INDEPENDENT_AMBULATORY_CARE_PROVIDER_SITE_OTHER): Payer: Medicare Other | Admitting: Internal Medicine

## 2020-12-08 VITALS — BP 136/84 | HR 76 | Temp 98.9°F | Resp 16 | Ht 73.0 in | Wt >= 6400 oz

## 2020-12-08 DIAGNOSIS — E1142 Type 2 diabetes mellitus with diabetic polyneuropathy: Secondary | ICD-10-CM | POA: Diagnosis not present

## 2020-12-08 DIAGNOSIS — B351 Tinea unguium: Secondary | ICD-10-CM

## 2020-12-08 DIAGNOSIS — D508 Other iron deficiency anemias: Secondary | ICD-10-CM

## 2020-12-08 DIAGNOSIS — Z23 Encounter for immunization: Secondary | ICD-10-CM

## 2020-12-08 DIAGNOSIS — E519 Thiamine deficiency, unspecified: Secondary | ICD-10-CM | POA: Diagnosis not present

## 2020-12-08 DIAGNOSIS — E559 Vitamin D deficiency, unspecified: Secondary | ICD-10-CM

## 2020-12-08 DIAGNOSIS — E1165 Type 2 diabetes mellitus with hyperglycemia: Secondary | ICD-10-CM | POA: Diagnosis not present

## 2020-12-08 DIAGNOSIS — I5032 Chronic diastolic (congestive) heart failure: Secondary | ICD-10-CM

## 2020-12-08 DIAGNOSIS — Z9884 Bariatric surgery status: Secondary | ICD-10-CM | POA: Diagnosis not present

## 2020-12-08 DIAGNOSIS — E039 Hypothyroidism, unspecified: Secondary | ICD-10-CM

## 2020-12-08 DIAGNOSIS — M8949 Other hypertrophic osteoarthropathy, multiple sites: Secondary | ICD-10-CM

## 2020-12-08 DIAGNOSIS — E118 Type 2 diabetes mellitus with unspecified complications: Secondary | ICD-10-CM | POA: Diagnosis not present

## 2020-12-08 DIAGNOSIS — E538 Deficiency of other specified B group vitamins: Secondary | ICD-10-CM | POA: Diagnosis not present

## 2020-12-08 DIAGNOSIS — I10 Essential (primary) hypertension: Secondary | ICD-10-CM

## 2020-12-08 DIAGNOSIS — M5416 Radiculopathy, lumbar region: Secondary | ICD-10-CM

## 2020-12-08 DIAGNOSIS — M159 Polyosteoarthritis, unspecified: Secondary | ICD-10-CM

## 2020-12-08 LAB — MICROALBUMIN / CREATININE URINE RATIO
Creatinine,U: 44 mg/dL
Microalb Creat Ratio: 1.6 mg/g (ref 0.0–30.0)
Microalb, Ur: <0.7 mg/dL (ref 0.0–1.9)

## 2020-12-08 LAB — URINALYSIS, ROUTINE W REFLEX MICROSCOPIC
Bilirubin Urine: NEGATIVE
Hgb urine dipstick: NEGATIVE
Ketones, ur: NEGATIVE
Leukocytes,Ua: NEGATIVE
Nitrite: NEGATIVE
RBC / HPF: NONE SEEN
Specific Gravity, Urine: <=1.005 — AB (ref 1.000–1.030)
Total Protein, Urine: NEGATIVE
Urine Glucose: >=1000 — AB
Urobilinogen, UA: 0.2 (ref 0.0–1.0)
WBC, UA: NONE SEEN
pH: 5.5 (ref 5.0–8.0)

## 2020-12-08 LAB — CBC WITH DIFFERENTIAL/PLATELET
Basophils Absolute: 0 10*3/uL (ref 0.0–0.1)
Basophils Relative: 0.3 % (ref 0.0–3.0)
Eosinophils Absolute: 0.2 10*3/uL (ref 0.0–0.7)
Eosinophils Relative: 2.6 % (ref 0.0–5.0)
HCT: 32.5 % — ABNORMAL LOW (ref 39.0–52.0)
Hemoglobin: 10.6 g/dL — ABNORMAL LOW (ref 13.0–17.0)
Lymphocytes Relative: 20.3 % (ref 12.0–46.0)
Lymphs Abs: 1.6 10*3/uL (ref 0.7–4.0)
MCHC: 32.5 g/dL (ref 30.0–36.0)
MCV: 91.4 fl (ref 78.0–100.0)
Monocytes Absolute: 0.6 10*3/uL (ref 0.1–1.0)
Monocytes Relative: 7 % (ref 3.0–12.0)
Neutro Abs: 5.6 10*3/uL (ref 1.4–7.7)
Neutrophils Relative %: 69.8 % (ref 43.0–77.0)
Platelets: 229 10*3/uL (ref 150.0–400.0)
RBC: 3.55 Mil/uL — ABNORMAL LOW (ref 4.22–5.81)
RDW: 16.2 % — ABNORMAL HIGH (ref 11.5–15.5)
WBC: 8 10*3/uL (ref 4.0–10.5)

## 2020-12-08 LAB — VITAMIN D 25 HYDROXY (VIT D DEFICIENCY, FRACTURES): VITD: 49.34 ng/mL (ref 30.00–100.00)

## 2020-12-08 LAB — IRON: Iron: 45 ug/dL (ref 42–165)

## 2020-12-08 LAB — HEMOGLOBIN A1C: Hgb A1c MFr Bld: 8.2 % — ABNORMAL HIGH (ref 4.6–6.5)

## 2020-12-08 LAB — FOLATE: Folate: 9.9 ng/mL

## 2020-12-08 LAB — FERRITIN: Ferritin: 613.9 ng/mL — ABNORMAL HIGH (ref 22.0–322.0)

## 2020-12-08 LAB — TSH: TSH: 2.59 u[IU]/mL (ref 0.35–4.50)

## 2020-12-08 MED ORDER — FUROSEMIDE 80 MG PO TABS: 80.0000 mg | ORAL_TABLET | Freq: Two times a day (BID) | ORAL | 1 refills | Status: DC

## 2020-12-08 MED ORDER — SYNJARDY 12.5-1000 MG PO TABS: 1.0000 | ORAL_TABLET | Freq: Two times a day (BID) | ORAL | 1 refills | Status: DC

## 2020-12-08 MED ORDER — CYANOCOBALAMIN 1000 MCG/ML IJ SOLN
1000.0000 ug | Freq: Once | INTRAMUSCULAR | Status: AC
  Administered 2020-12-08: 1000 ug via INTRAMUSCULAR

## 2020-12-08 NOTE — Patient Instructions (Signed)
[place] medicine (25th ed., pp. [phone]). [place], [place]: Elsevier.">  Anemia  Anemia is a condition in which there is not enough red blood cells or hemoglobin in the blood. Hemoglobin is a substance in red blood cells that carries oxygen. When you do not have enough red blood cells or hemoglobin (are anemic), your body cannot get enough oxygen and your organs may not work properly. As a result, you may feel very tired or have other problems. What are the causes? Common causes of anemia include:  Excessive bleeding. Anemia can be caused by excessive bleeding inside or outside the body, including bleeding from the intestines or from heavy menstrual periods in females.  Poor nutrition.  Long-lasting (chronic) kidney, thyroid, and liver disease.  Bone marrow disorders, spleen problems, and blood disorders.  Cancer and treatments for cancer.  HIV (human immunodeficiency virus) and AIDS (acquired immunodeficiency syndrome).  Infections, medicines, and autoimmune disorders that destroy red blood cells. What are the signs or symptoms? Symptoms of this condition include:  Minor weakness.  Dizziness.  Headache, or difficulties concentrating and sleeping.  Heartbeats that feel irregular or faster than normal (palpitations).  Shortness of breath, especially with exercise.  Pale skin, lips, and nails, or cold hands and feet.  Indigestion and nausea. Symptoms may occur suddenly or develop slowly. If your anemia is mild, you may not have symptoms. How is this diagnosed? This condition is diagnosed based on blood tests, your medical history, and a physical exam. In some cases, a test may be needed in which cells are removed from the soft tissue inside of a bone and looked at under a microscope (bone marrow biopsy). Your health care provider may also check your stool (feces) for blood and may do additional testing to look for the cause of your bleeding. Other tests may  include:  Imaging tests, such as a CT scan or MRI.  A procedure to see inside your esophagus and stomach (endoscopy).  A procedure to see inside your colon and rectum (colonoscopy). How is this treated? Treatment for this condition depends on the cause. If you continue to lose a lot of blood, you may need to be treated at a hospital. Treatment may include:  Taking supplements of iron, vitamin Q68, or folic acid.  Taking a hormone medicine (erythropoietin) that can help to stimulate red blood cell growth.  Having a blood transfusion. This may be needed if you lose a lot of blood.  Making changes to your diet.  Having surgery to remove your spleen. Follow these instructions at home:  Take over-the-counter and prescription medicines only as told by your health care provider.  Take supplements only as told by your health care provider.  Follow any diet instructions that you were given by your health care provider.  Keep all follow-up visits as told by your health care provider. This is important. Contact a health care provider if:  You develop new bleeding anywhere in the body. Get help right away if:  You are very weak.  You are short of breath.  You have pain in your abdomen or chest.  You are dizzy or feel faint.  You have trouble concentrating.  You have bloody stools, black stools, or tarry stools.  You vomit repeatedly or you vomit up blood. These symptoms may represent a serious problem that is an emergency. Do not wait to see if the symptoms will go away. Get medical help right away. Call your local emergency services (911 in the [place] Do not  drive yourself to the hospital. Summary  Anemia is a condition in which you do not have enough red blood cells or enough of a substance in your red blood cells that carries oxygen (hemoglobin).  Symptoms may occur suddenly or develop slowly.  If your anemia is mild, you may not have symptoms.  This condition is  diagnosed with blood tests, a medical history, and a physical exam. Other tests may be needed.  Treatment for this condition depends on the cause of the anemia. This information is not intended to replace advice given to you by your health care provider. Make sure you discuss any questions you have with your health care provider. Document Revised: 08/13/2019 Document Reviewed: 08/13/2019 Elsevier Patient Education  2021 Elsevier Inc.  

## 2020-12-08 NOTE — Progress Notes (Signed)
Subjective:  Patient ID: [name], male    DOB: 22-Sep-1955  Age: 65 y.o. MRN: [id number]  CC: Hypothyroidism, Hypertension, Diabetes, and Anemia  This visit occurred during the SARS-CoV-2 public health emergency.  Safety protocols were in place, including screening questions prior to the visit, additional usage of staff PPE, and extensive cleaning of exam room while observing appropriate contact time as indicated for disinfecting solutions.    HPI [name] presents for f/up -   Based on prescription refills he is no longer taking Metformin and the SGLT2 inhibitor.  He does not monitor his blood sugar.  He has chronic unchanged complaints including constipation, abdominal pain, and musculoskeletal pain.  Outpatient Medications Prior to Visit  Medication Sig Dispense Refill  . Accu-Chek Softclix Lancets lancets CHECK BLOOD SUGAR 2 TIMES  DAILY 300 each 3  . albuterol (VENTOLIN HFA) 108 (90 Base) MCG/ACT inhaler USE 2 INHALATIONS BY MOUTH  EVERY 6 HOURS AS NEEDED FOR WHEEZING OR SHORTNESS OF  BREATH 34 g 3  . aspirin EC 81 MG tablet Take 1 tablet (81 mg total) by mouth daily. 90 tablet 1  . atorvastatin (LIPITOR) 40 MG tablet TAKE 1 TABLET BY MOUTH  DAILY 90 tablet 1  . bisoprolol (ZEBETA) 10 MG tablet Take 1 tablet (10 mg total) by mouth every evening. 90 tablet 1  . blood glucose meter kit and supplies KIT Use to check blood sugar three times daily. Dispense Freestyle Libre according to insurance preference. DX: E11.8 2 each 0  . Blood Glucose Monitoring Suppl (ACCU-CHEK AVIVA CONNECT) w/Device KIT 1 Act by Does not apply route 3 (three) times daily as needed. 1 kit 3  . Blood Glucose Monitoring Suppl (ACCU-CHEK GUIDE) w/Device KIT     . Cholecalciferol 50 MCG (2000 UT) TABS Take 1 tablet (2,000 Units total) by mouth daily. 90 tablet 1  . ciclopirox (PENLAC) 8 % solution Apply topically at bedtime. Apply over nail and surrounding skin. Apply daily over previous coat. After seven  (7) days, may remove with alcohol and continue cycle. 6.6 mL 0  . cyanocobalamin 2000 MCG tablet Take 1 tablet (2,000 mcg total) by mouth daily. 90 tablet 1  . cycloSPORINE (RESTASIS) 0.05 % ophthalmic emulsion Place 1 drop into both eyes 2 (two) times daily. 22 each 1  . docusate sodium (COLACE) 100 MG capsule Take 100 mg by mouth 2 (two) times daily.    . ferrous sulfate (FEROSUL) 325 (65 FE) MG tablet Take 1 tablet (325 mg total) by mouth 2 (two) times daily with a meal. 180 tablet 1  . Glucagon (GVOKE HYPOPEN 2-PACK) 1 MG/0.2ML SOAJ Inject 1 Act into the skin daily as needed. 2 mL 5  . glucose blood (ACCU-CHEK AVIVA PLUS) test strip Use to test blood sugar twice daily. DX E11.8 200 strip 3  . Insulin Pen Needle (ULTICARE SHORT PEN NEEDLES) 31G X 8 MM MISC USE WITH INSULIN PENS DAILY. 100 each 3  . LEVEMIR FLEXTOUCH 100 UNIT/ML FlexPen INJECT 200 UNITS INTO THE SKIN DAILY. 75 mL 5  . levothyroxine (SYNTHROID) 50 MCG tablet TAKE 1 TABLET BY MOUTH  DAILY BEFORE BREAKFAST 90 tablet 1  . losartan (COZAAR) 50 MG tablet Take 1 tablet (50 mg total) by mouth daily. 90 tablet 1  . Multiple Vitamins-Minerals (MULTIVITAMIN ADULT PO) Take by mouth.    Marland Kitchen omeprazole (PRILOSEC) 40 MG capsule Take 1 tablet by mouth 30 min before breakfast. 90 capsule 3  . potassium chloride SA (KLOR-CON) 20  MEQ tablet Take 1 tablet (20 mEq total) by mouth 2 (two) times daily. 180 tablet 1  . thiamine (VITAMIN B-1) 100 MG tablet Take 1 tablet (100 mg total) by mouth daily. 90 tablet 1  . Empagliflozin-metFORMIN HCl (SYNJARDY) 12.01-999 MG TABS Take 1 tablet by mouth 2 (two) times daily. 180 tablet 1  . furosemide (LASIX) 80 MG tablet Take 1.5 tablets (120 mg total) by mouth daily. Take extra 0.5 tab as needed    . HYDROcodone-acetaminophen (NORCO) 10-325 MG tablet TAKE 1 TABLET BY MOUTH EVERY 6 HOURS AS NEEDED. 60 tablet 0   No facility-administered medications prior to visit.    ROS Review of Systems  Constitutional:  Negative for chills, diaphoresis, fatigue and fever.  HENT: Negative.   Respiratory: Negative for chest tightness, shortness of breath and wheezing.   Cardiovascular: Positive for leg swelling. Negative for chest pain and palpitations.  Gastrointestinal: Positive for abdominal pain and constipation. Negative for diarrhea, nausea and vomiting.  Endocrine: Negative for polydipsia, polyphagia and polyuria.  Genitourinary: Negative.  Negative for difficulty urinating.  Musculoskeletal: Positive for arthralgias and back pain. Negative for myalgias.  Skin: Negative.   Neurological: Negative.  Negative for dizziness, weakness, light-headedness and numbness.  Hematological: Negative for adenopathy. Does not bruise/bleed easily.  Psychiatric/Behavioral: Negative.     Objective:  BP 136/84   Pulse 76   Temp 98.9 F (37.2 C) (Oral)   Resp 16   Ht 6' 1"  (1.854 m)   Wt (!) 445 lb (201.9 kg)   SpO2 95%   BMI 58.71 kg/m   BP Readings from Last 3 Encounters:  12/08/20 136/84  12/04/20 110/60  11/24/20 (!) 148/92    Wt Readings from Last 3 Encounters:  12/08/20 (!) 445 lb (201.9 kg)  11/24/20 (!) 455 lb 9.6 oz (206.7 kg)  11/06/20 (!) 450 lb (204.1 kg)    Physical Exam Vitals reviewed.  Constitutional:      General: He is not in acute distress.    Appearance: He is obese. He is ill-appearing. He is not toxic-appearing or diaphoretic.  HENT:     Nose: Nose normal.     Mouth/Throat:     Mouth: Mucous membranes are moist.  Eyes:     General: No scleral icterus.    Conjunctiva/sclera: Conjunctivae normal.  Cardiovascular:     Rate and Rhythm: Normal rate and regular rhythm.     Heart sounds: Normal heart sounds, S1 normal and S2 normal. No gallop.   Pulmonary:     Effort: Pulmonary effort is normal.     Breath sounds: No stridor. No wheezing, rhonchi or rales.  Abdominal:     General: Abdomen is protuberant. Bowel sounds are normal. There is no distension.     Palpations: Abdomen  is soft. There is no hepatomegaly or mass.     Tenderness: There is no abdominal tenderness.  Musculoskeletal:     Cervical back: Neck supple.     Right lower leg: Pitting Edema (trace) present.     Left lower leg: 1+ Pitting Edema present.  Lymphadenopathy:     Cervical: No cervical adenopathy.  Skin:    General: Skin is warm and dry.     Findings: No erythema.  Neurological:     General: No focal deficit present.     Mental Status: He is alert and oriented to person, place, and time. Mental status is at baseline.  Psychiatric:        Mood and Affect: Mood normal.  Behavior: Behavior normal.     Lab Results  Component Value Date   WBC 8.0 12/08/2020   HGB 10.6 (L) 12/08/2020   HCT 32.5 (L) 12/08/2020   PLT 229.0 12/08/2020   GLUCOSE 49 (L) 11/24/2020   CHOL 174 02/13/2020   TRIG 101.0 02/13/2020   HDL 32.50 (L) 02/13/2020   LDLDIRECT 136.8 12/12/2006   LDLCALC 122 (H) 02/13/2020   ALT 20 07/23/2020   AST 22 07/23/2020   NA 140 11/24/2020   K 4.1 11/24/2020   CL 107 11/24/2020   CREATININE 1.78 (H) 11/24/2020   BUN 27 (H) 11/24/2020   CO2 28 11/24/2020   TSH 2.59 12/08/2020   PSA 1.73 02/13/2020   HGBA1C 8.2 (H) 12/08/2020   MICROALBUR <0.7 12/08/2020    No results found.  Assessment & Plan:   Jourdan was seen today for hypothyroidism, hypertension, diabetes and anemia.  Diagnoses and all orders for this visit:  Essential hypertension-his blood pressure is adequately well controlled. -     Cancel: Basic metabolic panel; Future -     Urinalysis, Routine w reflex microscopic; Future -     furosemide (LASIX) 80 MG tablet; Take 1 tablet (80 mg total) by mouth 2 (two) times daily. Take extra 0.5 tab as needed -     Urinalysis, Routine w reflex microscopic  Uncontrolled type 2 diabetes mellitus with hyperglycemia (HCC)-his A1c is up to 8.2%.  I recommended that he restart the Metformin and SGLT2 inhibitor. -     Hemoglobin A1c; Future -     Microalbumin /  creatinine urine ratio; Future -     Urinalysis, Routine w reflex microscopic; Future -     Urinalysis, Routine w reflex microscopic -     Microalbumin / creatinine urine ratio -     Hemoglobin A1c -     Empagliflozin-metFORMIN HCl (SYNJARDY) 12.01-999 MG TABS; Take 1 tablet by mouth 2 (two) times daily.  Acquired hypothyroidism-his TSH is in the normal range.  He will stay on the current dose of levothyroxine. -     TSH; Future -     TSH  B12 deficiency-he is still anemic.  I recommended that he be more consistent with the parenteral B12 replacement therapy. -     CBC with Differential/Platelet; Future -     cyanocobalamin ((VITAMIN B-12)) injection 1,000 mcg -     CBC with Differential/Platelet  Bariatric surgery status-I will screen him for vitamin deficiencies. -     Iron; Future -     Vitamin B1; Future -     VITAMIN D 25 Hydroxy (Vit-D Deficiency, Fractures); Future -     Folate; Future -     Ferritin; Future -     Ferritin -     Folate -     VITAMIN D 25 Hydroxy (Vit-D Deficiency, Fractures) -     Vitamin B1 -     Iron  Other iron deficiency anemia-see above. -     CBC with Differential/Platelet; Future -     CBC with Differential/Platelet  Thiamine deficiency -     CBC with Differential/Platelet; Future -     CBC with Differential/Platelet  Vitamin D deficiency -     VITAMIN D 25 Hydroxy (Vit-D Deficiency, Fractures); Future -     VITAMIN D 25 Hydroxy (Vit-D Deficiency, Fractures)  Onychomycosis of toenail  Chronic diastolic CHF (congestive heart failure) ([place])- His volume status is stable. -     furosemide (LASIX) 80 MG tablet;  Take 1 tablet (80 mg total) by mouth 2 (two) times daily. Take extra 0.5 tab as needed  Type II diabetes mellitus with manifestations (HCC) -     Empagliflozin-metFORMIN HCl (SYNJARDY) 12.01-999 MG TABS; Take 1 tablet by mouth 2 (two) times daily.  Left lumbar radiculopathy -     HYDROcodone-acetaminophen (NORCO) 10-325 MG tablet; Take  1 tablet by mouth every 6 (six) hours as needed.  Diabetic peripheral neuropathy (HCC) -     HYDROcodone-acetaminophen (NORCO) 10-325 MG tablet; Take 1 tablet by mouth every 6 (six) hours as needed.  Primary osteoarthritis involving multiple joints -     HYDROcodone-acetaminophen (NORCO) 10-325 MG tablet; Take 1 tablet by mouth every 6 (six) hours as needed.  Other orders -     Tdap vaccine greater than or equal to 7yo IM   I have changed Corrion Cherney's furosemide and HYDROcodone-acetaminophen. I am also having him maintain his omeprazole, Multiple Vitamins-Minerals (MULTIVITAMIN ADULT PO), Restasis, thiamine, UltiCare Short Pen Needles, ferrous sulfate, cyanocobalamin, Cholecalciferol, aspirin EC, docusate sodium, albuterol, bisoprolol, Gvoke HypoPen 2-Pack, potassium chloride SA, losartan, atorvastatin, levothyroxine, Accu-Chek Aviva Plus, blood glucose meter kit and supplies, Accu-Chek Softclix Lancets, Accu-Chek Aviva Connect, ciclopirox, Accu-Chek Guide, Levemir FlexTouch, and Synjardy. We administered cyanocobalamin.  Meds ordered this encounter  Medications  . furosemide (LASIX) 80 MG tablet    Sig: Take 1 tablet (80 mg total) by mouth 2 (two) times daily. Take extra 0.5 tab as needed    Dispense:  180 tablet    Refill:  1  . cyanocobalamin ((VITAMIN B-12)) injection 1,000 mcg  . Empagliflozin-metFORMIN HCl (SYNJARDY) 12.01-999 MG TABS    Sig: Take 1 tablet by mouth 2 (two) times daily.    Dispense:  180 tablet    Refill:  1  . HYDROcodone-acetaminophen (NORCO) 10-325 MG tablet    Sig: Take 1 tablet by mouth every 6 (six) hours as needed.    Dispense:  60 tablet    Refill:  0     Follow-up: Return in about 3 months (around 03/10/2021).  Scarlette Calico, MD

## 2020-12-08 NOTE — Progress Notes (Signed)
Per PCP he was notified during the [facility] that with his insurance a TDAP vaccine would not be covered and he could incur a bill. He expressed understanding and wanted to proceed with vaccine in-office.   Colton Engdahl, CMA

## 2020-12-09 ENCOUNTER — Other Ambulatory Visit: Payer: Self-pay | Admitting: Internal Medicine

## 2020-12-09 DIAGNOSIS — M8949 Other hypertrophic osteoarthropathy, multiple sites: Secondary | ICD-10-CM

## 2020-12-09 DIAGNOSIS — M159 Polyosteoarthritis, unspecified: Secondary | ICD-10-CM

## 2020-12-09 DIAGNOSIS — M5416 Radiculopathy, lumbar region: Secondary | ICD-10-CM

## 2020-12-09 DIAGNOSIS — E1142 Type 2 diabetes mellitus with diabetic polyneuropathy: Secondary | ICD-10-CM

## 2020-12-09 MED ORDER — HYDROCODONE-ACETAMINOPHEN 10-325 MG PO TABS: 1.0000 | ORAL_TABLET | Freq: Four times a day (QID) | ORAL | 0 refills | Status: DC | PRN

## 2020-12-12 ENCOUNTER — Encounter: Payer: Self-pay | Admitting: Internal Medicine

## 2020-12-12 LAB — VITAMIN B1: Vitamin B1 (Thiamine): 172 nmol/L — ABNORMAL HIGH (ref 8–30)

## 2020-12-18 DIAGNOSIS — E785 Hyperlipidemia, unspecified: Secondary | ICD-10-CM | POA: Diagnosis not present

## 2020-12-18 DIAGNOSIS — E559 Vitamin D deficiency, unspecified: Secondary | ICD-10-CM | POA: Diagnosis not present

## 2020-12-18 DIAGNOSIS — D631 Anemia in chronic kidney disease: Secondary | ICD-10-CM | POA: Diagnosis not present

## 2020-12-18 DIAGNOSIS — I5030 Unspecified diastolic (congestive) heart failure: Secondary | ICD-10-CM | POA: Diagnosis not present

## 2020-12-18 DIAGNOSIS — I129 Hypertensive chronic kidney disease with stage 1 through stage 4 chronic kidney disease, or unspecified chronic kidney disease: Secondary | ICD-10-CM | POA: Diagnosis not present

## 2020-12-18 DIAGNOSIS — N1832 Chronic kidney disease, stage 3b: Secondary | ICD-10-CM | POA: Diagnosis not present

## 2020-12-18 DIAGNOSIS — E1122 Type 2 diabetes mellitus with diabetic chronic kidney disease: Secondary | ICD-10-CM | POA: Diagnosis not present

## 2020-12-24 ENCOUNTER — Telehealth: Payer: Self-pay | Admitting: Internal Medicine

## 2020-12-24 ENCOUNTER — Other Ambulatory Visit: Payer: Self-pay | Admitting: Internal Medicine

## 2020-12-24 DIAGNOSIS — E118 Type 2 diabetes mellitus with unspecified complications: Secondary | ICD-10-CM

## 2020-12-24 MED ORDER — LEVEMIR FLEXTOUCH 100 UNIT/ML ~~LOC~~ SOPN: 200.0000 [IU] | PEN_INJECTOR | Freq: Every day | SUBCUTANEOUS | 5 refills | Status: DC

## 2020-12-24 NOTE — Telephone Encounter (Signed)
Patient requesting 90 day supply of LEVEMIR FLEXTOUCH 100 UNIT/ML FlexPen be refilled  [place], [place]

## 2020-12-25 ENCOUNTER — Telehealth: Payer: Self-pay | Admitting: Internal Medicine

## 2020-12-25 NOTE — Telephone Encounter (Signed)
insulin detemir (LEVEMIR FLEXTOUCH) 100 UNIT/ML FlexPen Patient states he has been taking between 240-300 units a day and now he is almost out and the pharmacy cannot refill it it because its only prescribed for 200, patient is wondering if it can be upped to 240 a day with the refill

## 2020-12-28 ENCOUNTER — Telehealth (HOSPITAL_COMMUNITY): Payer: Self-pay | Admitting: *Deleted

## 2020-12-28 ENCOUNTER — Other Ambulatory Visit: Payer: Self-pay | Admitting: Internal Medicine

## 2020-12-28 DIAGNOSIS — E118 Type 2 diabetes mellitus with unspecified complications: Secondary | ICD-10-CM

## 2020-12-28 MED ORDER — LEVEMIR FLEXTOUCH 100 UNIT/ML ~~LOC~~ SOPN: 240.0000 [IU] | PEN_INJECTOR | Freq: Every day | SUBCUTANEOUS | 5 refills | Status: DC

## 2020-12-28 NOTE — Telephone Encounter (Signed)
Pt left vm stating he did not receive his scale.  Eliezer Lofts (clinic social worker) ordered scale 3/8 to be delivered to patients home. Jenna aware pt did not receive scale and will follow up/check tracking with Raquel Sarna (social worker) that actually ordered scale for pt. Called pt to inform but no answer/no vm.

## 2020-12-29 ENCOUNTER — Telehealth (HOSPITAL_COMMUNITY): Payer: Self-pay | Admitting: Licensed Clinical Social Worker

## 2020-12-29 NOTE — Telephone Encounter (Signed)
CSW informed by clinic staff that pt never received bariatric scale that we ordered on 3/8.  Per [employer]	[place] was delivered 3/9 but pt says he never receives it.  Pt is agreeable to picking up scale from the clinic so we can avoid another delivery problem- scale ordered and CSW will reach out to pt once it arrives so he can pick up.  Jorge Ny, LCSW Clinical Social Worker [facility] Desk#: [phone] Cell#: [phone]

## 2021-01-05 ENCOUNTER — Other Ambulatory Visit: Payer: Medicare Other

## 2021-01-05 ENCOUNTER — Other Ambulatory Visit: Payer: Self-pay | Admitting: Internal Medicine

## 2021-01-05 DIAGNOSIS — I5032 Chronic diastolic (congestive) heart failure: Secondary | ICD-10-CM

## 2021-01-05 DIAGNOSIS — I10 Essential (primary) hypertension: Secondary | ICD-10-CM

## 2021-01-06 ENCOUNTER — Ambulatory Visit (INDEPENDENT_AMBULATORY_CARE_PROVIDER_SITE_OTHER): Payer: Medicare Other | Admitting: Podiatry

## 2021-01-06 ENCOUNTER — Other Ambulatory Visit: Payer: Self-pay

## 2021-01-06 DIAGNOSIS — M2011 Hallux valgus (acquired), right foot: Secondary | ICD-10-CM

## 2021-01-06 DIAGNOSIS — M2012 Hallux valgus (acquired), left foot: Secondary | ICD-10-CM

## 2021-01-06 DIAGNOSIS — E1142 Type 2 diabetes mellitus with diabetic polyneuropathy: Secondary | ICD-10-CM

## 2021-01-06 NOTE — Progress Notes (Signed)
Patient presented for foam casting for 3 pair custom diabetic shoe inserts. Patient is measured with a [place] Device to be a size 15 x-wide  Diabetic shoes are chosen from the safe step catalog.  The shoes chosen are [id number]  Patient will be contacted when the shoes and inserts are ready to be picked up.

## 2021-01-10 ENCOUNTER — Other Ambulatory Visit: Payer: Self-pay | Admitting: Internal Medicine

## 2021-01-10 DIAGNOSIS — E1165 Type 2 diabetes mellitus with hyperglycemia: Secondary | ICD-10-CM

## 2021-01-10 DIAGNOSIS — E118 Type 2 diabetes mellitus with unspecified complications: Secondary | ICD-10-CM

## 2021-01-11 ENCOUNTER — Telehealth (HOSPITAL_COMMUNITY): Payer: Self-pay | Admitting: Licensed Clinical Social Worker

## 2021-01-11 ENCOUNTER — Telehealth: Payer: Self-pay

## 2021-01-11 NOTE — Telephone Encounter (Signed)
Pt picked up bariatric talking scale from clinic on 01/08/21  Jorge Ny, [facility] Desk#: [phone] Cell#: [phone]

## 2021-01-11 NOTE — Progress Notes (Signed)
Chronic Care Management Pharmacy Assistant   Name: [name]  MRN: [id number] DOB: [birth year]  Reason for Encounter: Initial Questions Appointment: OV 01/12/21 9 am (Patient cancelled)   Recent office visits:  12/08/20 [name] (PCP) - Hypertension. Medications was changed furosemide 80 mg 2x daily and HYDROcodone-acetaminophen 10-325 mg. Patient asked to f/u in 3 mos.  09/08/20 [name] (PCP) - Obesity. Patient phentermine was discontinued. Patient asked to f/u in 3 mos.   07/23/20 [name] (PCP) - Type 2 Diabetes, anemia & CHF.  Patient was started on Gvoke HypoPen 2-Pack and terbinafine. Patient asked to f/u in 3 mos. A referral to [facility] was made.  Recent consult visits:  01/06/21 [name] (Podiatry) - Patient had fitting for diabetic shoes.   12/04/20 [name] (Gastroenterology) - Chronic constipation. Patient instructed to try Miralax everyday and dulcolax every other night..  11/13/20 [name] (Podiatry) - Onychomycosis. A request for diabetic shoes was made. Patient asked to f/u in 3 mos.  07/1720 [name] (Podiatry) - Bony Exostosis.  Patient had foot debrided of soft tissue plantar there was no indications of drainage and no iatrogenic bleeding. Diabetic shoes was recommended.  Hospital visits:  None in previous 6 months  Medications: Outpatient Encounter Medications as of 01/11/2021  Medication Sig  . Accu-Chek Softclix Lancets lancets CHECK BLOOD SUGAR 2 TIMES  DAILY  . albuterol (VENTOLIN HFA) 108 (90 Base) MCG/ACT inhaler USE 2 INHALATIONS BY MOUTH  EVERY 6 HOURS AS NEEDED FOR WHEEZING OR SHORTNESS OF  BREATH  . aspirin EC 81 MG tablet Take 1 tablet (81 mg total) by mouth daily.  Marland Kitchen atorvastatin (LIPITOR) 40 MG tablet TAKE 1 TABLET BY MOUTH  DAILY  . bisoprolol (ZEBETA) 10 MG tablet TAKE 1 TABLET BY MOUTH EVERY EVENING.  . blood glucose meter kit and supplies KIT Use to check blood sugar three times daily. Dispense Freestyle Libre according to insurance  preference. DX: E11.8  . Blood Glucose Monitoring Suppl (ACCU-CHEK AVIVA CONNECT) w/Device KIT 1 Act by Does not apply route 3 (three) times daily as needed.  . Blood Glucose Monitoring Suppl (ACCU-CHEK GUIDE) w/Device KIT   . Cholecalciferol 50 MCG (2000 UT) TABS Take 1 tablet (2,000 Units total) by mouth daily.  . ciclopirox (PENLAC) 8 % solution Apply topically at bedtime. Apply over nail and surrounding skin. Apply daily over previous coat. After seven (7) days, may remove with alcohol and continue cycle.  . cyanocobalamin 2000 MCG tablet Take 1 tablet (2,000 mcg total) by mouth daily.  . cycloSPORINE (RESTASIS) 0.05 % ophthalmic emulsion Place 1 drop into both eyes 2 (two) times daily.  Marland Kitchen docusate sodium (COLACE) 100 MG capsule Take 100 mg by mouth 2 (two) times daily.  . ferrous sulfate (FEROSUL) 325 (65 FE) MG tablet Take 1 tablet (325 mg total) by mouth 2 (two) times daily with a meal.  . furosemide (LASIX) 80 MG tablet Take 1 tablet (80 mg total) by mouth 2 (two) times daily. Take extra 0.5 tab as needed  . Glucagon (GVOKE HYPOPEN 2-PACK) 1 MG/0.2ML SOAJ Inject 1 Act into the skin daily as needed.  Marland Kitchen glucose blood (ACCU-CHEK AVIVA PLUS) test strip Use to test blood sugar twice daily. DX E11.8  . HYDROcodone-acetaminophen (NORCO) 10-325 MG tablet Take 1 tablet by mouth every 6 (six) hours as needed.  . insulin detemir (LEVEMIR FLEXTOUCH) 100 UNIT/ML FlexPen Inject 240 Units into the skin daily.  . Insulin Pen Needle (ULTICARE SHORT PEN NEEDLES) 31G X 8 MM MISC  USE WITH INSULIN PENS DAILY.  Marland Kitchen levothyroxine (SYNTHROID) 50 MCG tablet TAKE 1 TABLET BY MOUTH  DAILY BEFORE BREAKFAST  . losartan (COZAAR) 50 MG tablet Take 1 tablet (50 mg total) by mouth daily.  . Multiple Vitamins-Minerals (MULTIVITAMIN ADULT PO) Take by mouth.  Marland Kitchen omeprazole (PRILOSEC) 40 MG capsule Take 1 tablet by mouth 30 min before breakfast.  . potassium chloride SA (KLOR-CON) 20 MEQ tablet Take 1 tablet (20 mEq total) by  mouth 2 (two) times daily.  Marland Kitchen SYNJARDY 12.01-999 MG TABS TAKE 1 TABLET BY MOUTH  TWICE DAILY  . thiamine (VITAMIN B-1) 100 MG tablet Take 1 tablet (100 mg total) by mouth daily.   No facility-administered encounter medications on file as of 01/11/2021.    Have you seen any other providers since your last visit?  Patient states he seen his foot and kidney doctors recently.  Any changes in your medications or health?  Patient states no changes at this time.  Any side effects from any medications?  Patient states no changes in medications.  Do you have an symptoms or problems not managed by your medications?  Patient states not at this time.  Any concerns about your health right now?  Patient states he has a Hernia that's getting bigger and he has to lose weight in order to have surgery.  Has your provider asked that you check blood pressure, blood sugar, or follow special diet at home?  Patient states he doesn't check his BP, glucose or follow a special diet.   Do you get any type of exercise on a regular basis?  Patient states he suppose to start [employer] next week.   Can you think of a goal you would like to reach for your health?  Patient states he wants to lose weight so he can have surgery .  Do you have any problems getting your medications? Patient states he has no problems getting his medications  Is there anything that you would like to discuss during the appointment?  Patient states not at this time but he wants to reschedule for tomorrow because he uses transportation and was unaware of this appointment.   Please bring medications and supplements to appointment   Star Rating Drugs: Atorvastatin - 12/21/20 90D Losartan - 07/24/20 [name], RMA Clinical Pharmacists Assistant [phone]  Time Spent: [phone]

## 2021-01-12 ENCOUNTER — Other Ambulatory Visit: Payer: Self-pay | Admitting: *Deleted

## 2021-01-12 ENCOUNTER — Ambulatory Visit: Payer: Medicare Other

## 2021-01-14 ENCOUNTER — Encounter: Payer: Self-pay | Admitting: *Deleted

## 2021-01-14 NOTE — Patient Outreach (Signed)
[facility]) Care Management  [place]  01/14/2021   [name] 04-02-1956 [id number]  RN Health Coach telephone call to patient.  Hipaa compliance verified. Per patient his fasting blood sugar is 120. Patient A1C has increased from 7.6 to 8.2. Per patient he has not been exercising. Patient stated that he has joined the [facility]. He will start going 3 days a week in May increase in July 4 days a week. He stated that he wants to do water aerobics.  He is using the [employer] bus and public transportation. Per patient he has received his COVID vaccines. He is working hard on portion control. He is using a smaller sectional plate. He has agreed to further outreach calls.   Encounter Medications:  Outpatient Encounter Medications as of 01/12/2021  Medication Sig  . Accu-Chek Softclix Lancets lancets CHECK BLOOD SUGAR 2 TIMES  DAILY  . albuterol (VENTOLIN HFA) 108 (90 Base) MCG/ACT inhaler USE 2 INHALATIONS BY MOUTH  EVERY 6 HOURS AS NEEDED FOR WHEEZING OR SHORTNESS OF  BREATH  . aspirin EC 81 MG tablet Take 1 tablet (81 mg total) by mouth daily.  Marland Kitchen atorvastatin (LIPITOR) 40 MG tablet TAKE 1 TABLET BY MOUTH  DAILY  . bisoprolol (ZEBETA) 10 MG tablet TAKE 1 TABLET BY MOUTH EVERY EVENING.  . Blood Glucose Monitoring Suppl (ACCU-CHEK AVIVA CONNECT) w/Device KIT 1 Act by Does not apply route 3 (three) times daily as needed.  . ciclopirox (PENLAC) 8 % solution Apply topically at bedtime. Apply over nail and surrounding skin. Apply daily over previous coat. After seven (7) days, may remove with alcohol and continue cycle.  . cyanocobalamin 2000 MCG tablet Take 1 tablet (2,000 mcg total) by mouth daily.  . cycloSPORINE (RESTASIS) 0.05 % ophthalmic emulsion Place 1 drop into both eyes 2 (two) times daily.  . ferrous sulfate (FEROSUL) 325 (65 FE) MG tablet Take 1 tablet (325 mg total) by mouth 2 (two) times daily with a meal.  . furosemide (LASIX) 80 MG tablet Take 1 tablet (80 mg total)  by mouth 2 (two) times daily. Take extra 0.5 tab as needed  . Glucagon (GVOKE HYPOPEN 2-PACK) 1 MG/0.2ML SOAJ Inject 1 Act into the skin daily as needed.  Marland Kitchen HYDROcodone-acetaminophen (NORCO) 10-325 MG tablet Take 1 tablet by mouth every 6 (six) hours as needed.  . insulin detemir (LEVEMIR FLEXTOUCH) 100 UNIT/ML FlexPen Inject 240 Units into the skin daily.  . Insulin Pen Needle (ULTICARE SHORT PEN NEEDLES) 31G X 8 MM MISC USE WITH INSULIN PENS DAILY.  Marland Kitchen levothyroxine (SYNTHROID) 50 MCG tablet TAKE 1 TABLET BY MOUTH  DAILY BEFORE BREAKFAST  . losartan (COZAAR) 50 MG tablet Take 1 tablet (50 mg total) by mouth daily.  . Multiple Vitamins-Minerals (MULTIVITAMIN ADULT PO) Take by mouth.  Marland Kitchen omeprazole (PRILOSEC) 40 MG capsule Take 1 tablet by mouth 30 min before breakfast.  . potassium chloride SA (KLOR-CON) 20 MEQ tablet Take 1 tablet (20 mEq total) by mouth 2 (two) times daily.  Marland Kitchen SYNJARDY 12.01-999 MG TABS TAKE 1 TABLET BY MOUTH  TWICE DAILY  . thiamine (VITAMIN B-1) 100 MG tablet Take 1 tablet (100 mg total) by mouth daily.  . blood glucose meter kit and supplies KIT Use to check blood sugar three times daily. Dispense Freestyle Libre according to insurance preference. DX: E11.8  . Blood Glucose Monitoring Suppl (ACCU-CHEK GUIDE) w/Device KIT   . Cholecalciferol 50 MCG (2000 UT) TABS Take 1 tablet (2,000 Units total) by mouth  daily.  . docusate sodium (COLACE) 100 MG capsule Take 100 mg by mouth 2 (two) times daily. (Patient not taking: Reported on 01/12/2021)  . glucose blood (ACCU-CHEK AVIVA PLUS) test strip Use to test blood sugar twice daily. DX E11.8   No facility-administered encounter medications on file as of 01/12/2021.    Functional Status:  In your present state of health, do you have any difficulty performing the following activities: 11/06/2020  Hearing? N  Vision? N  Difficulty concentrating or making decisions? N  Walking or climbing stairs? Y  Dressing or bathing? N  Doing  errands, shopping? N  Preparing Food and eating ? N  Using the Toilet? N  In the past six months, have you accidently leaked urine? N  Do you have problems with loss of bowel control? N  Managing your Medications? N  Managing your Finances? N  Housekeeping or managing your Housekeeping? N  Some recent data might be hidden    Fall/Depression Screening: Fall Risk  01/12/2021 11/06/2020 10/07/2020  Falls in the past year? 1 0 1  Comment - - -  Number falls in past yr: 1 0 1  Comment - - -  Injury with Fall? 0 0 0  Risk Factor Category  - - -  Comment - - -  Risk for fall due to : History of fall(s);Impaired balance/gait;Impaired mobility No Fall Risks History of fall(s);Impaired balance/gait;Impaired mobility;Medication side effect  Risk for fall due to: Comment - - -  Follow up Falls evaluation completed;Falls prevention discussed - Falls evaluation completed   PHQ 2/9 Scores 11/06/2020 02/13/2020 12/26/2018 11/27/2018 11/22/2018 10/17/2017 08/08/2016  PHQ - 2 Score 0 0 0 0 0 1 0  PHQ- 9 Score - - - - - 9 -    Assessment:  Goals Addressed            This Visit's Progress   . [facility]) Learn More About My Health   On track    Timeframe:  Long-Range Goal Priority:  Medium Start Date:     33825053                        Expected End Date: 1230222                Follow Up Date 97673419 - tell my story and reason for my visit - make a list of questions - ask questions - repeat what I heard to make sure I understand - bring a list of my medicines to the visit - speak up when I don't understand    Why is this important?   The best way to learn about your health and care is by talking to the doctor and nurse.  They will answer your questions and give you information in the way that you like best.    Notes:  Provided educational material on healthy meal planning    . [facility]) Make and Keep All Appointments   On track    Timeframe:  Long-Range Goal Priority:  Medium Start Date:    37902409                          Expected End Date:     73532992                Follow Up Date 42683419   - arrange a ride through an agency 1 week before appointment - ask family or friend  for a ride - call to cancel if needed - keep a calendar with appointment dates    Why is this important?   Part of staying healthy is seeing the doctor for follow-up care.  If you forget your appointments, there are some things you can do to stay on track.    Notes:  [id number] Patient has an appointment on 0622 for next A1C draw    . [facility]) Monitor and Manage My Blood Sugar   On track    Timeframe:  Long-Range Goal Priority:  High Start Date:      76734193                       Expected End Date:     79024097           Follow Up Date 03/18/21    - check blood sugar at prescribed times - check blood sugar if I feel it is too high or too low - enter blood sugar readings and medication or insulin into daily log - take the blood sugar log to all doctor visits - take the blood sugar meter to all doctor visits -check blood sugars at least twice a day    Why is this important?   Checking your blood sugar at home helps to keep it from getting very high or very low.  Writing the results in a diary or log helps the doctor know how to care for you.  Your blood sugar log should have the time, date and the results.  Also, write down the amount of insulin or other medicine that you take.  Other information, like what you ate, exercise done and how you were feeling, will also be helpful.     Notes:  Patient has not checked his blood sugar in several days(Glucometer broken) 01/12/2021 Patient is now able to monitor blood sugars. Today fasting blood sugar 120    . [facility]) Perform Foot Care   On track    Timeframe:  Long-Range Goal Priority:  Medium Start Date:     35329924                        Expected End Date:   26834196                 Follow Up Date 22297989   - check feet daily for cuts, sores or  redness - keep feet up while sitting - wash and dry feet carefully every day - wear comfortable, cotton socks - wear comfortable, well-fitting shoes -discuss with provider wound center referral for left foot wound -continue to see podiatry for toe nail maintenance     Why is this important?   Good foot care is very important when you have diabetes.  There are many things you can do to keep your feet healthy and catch a problem early.    Notes:  04/262022 Last POD visit [id number]     . [facility]) Set My Target A1C   Not on track    Timeframe:  Long-Range Goal Priority:  High Start Date:      08144818                       Expected End Date:  56314970                 Follow Up Date 26378588 - set target A1C -discuss with provider target Hgb A1C,  current is 7.6    Why is this important?   Your target A1C is decided together by you and your doctor.  It is based on several things like your age and other health issues.    Notes:  Goal 7.0 [id number] Patient is currently at 8.2       Plan:  Follow-up:  Patient agrees to Care Plan and Follow-up. Patient will work harder with portion control Patient will start an exercise routine at [facility] 3x week in May June exercise routine 4 x week RN will follow up within the month of July RN sent update assessment to PCP  [place] [facility] [phone]

## 2021-01-14 NOTE — Patient Instructions (Signed)
Goals Addressed            This Visit's Progress   . [facility]) Learn More About My Health   On track    Timeframe:  Long-Range Goal Priority:  Medium Start Date:     94854627                        Expected End Date: 1230222                Follow Up Date 03500938 - tell my story and reason for my visit - make a list of questions - ask questions - repeat what I heard to make sure I understand - bring a list of my medicines to the visit - speak up when I don't understand    Why is this important?   The best way to learn about your health and care is by talking to the doctor and nurse.  They will answer your questions and give you information in the way that you like best.    Notes:  Provided educational material on healthy meal planning    . [facility]) Make and Keep All Appointments   On track    Timeframe:  Long-Range Goal Priority:  Medium Start Date:   18299371                          Expected End Date:     69678938                Follow Up Date 10175102   - arrange a ride through an agency 1 week before appointment - ask family or friend for a ride - call to cancel if needed - keep a calendar with appointment dates    Why is this important?   Part of staying healthy is seeing the doctor for follow-up care.  If you forget your appointments, there are some things you can do to stay on track.    Notes:  [id number] Patient has an appointment on 0622 for next A1C draw    . [facility]) Monitor and Manage My Blood Sugar   On track    Timeframe:  Long-Range Goal Priority:  High Start Date:      42353614                       Expected End Date:     43154008           Follow Up Date 03/18/21    - check blood sugar at prescribed times - check blood sugar if I feel it is too high or too low - enter blood sugar readings and medication or insulin into daily log - take the blood sugar log to all doctor visits - take the blood sugar meter to all doctor visits -check blood sugars at least  twice a day    Why is this important?   Checking your blood sugar at home helps to keep it from getting very high or very low.  Writing the results in a diary or log helps the doctor know how to care for you.  Your blood sugar log should have the time, date and the results.  Also, write down the amount of insulin or other medicine that you take.  Other information, like what you ate, exercise done and how you were feeling, will also be helpful.     Notes:  Patient has  not checked his blood sugar in several days(Glucometer broken) 01/12/2021 Patient is now able to monitor blood sugars. Today fasting blood sugar 120    . [facility]) Perform Foot Care   On track    Timeframe:  Long-Range Goal Priority:  Medium Start Date:     36122449                        Expected End Date:   75300511                 Follow Up Date 02111735   - check feet daily for cuts, sores or redness - keep feet up while sitting - wash and dry feet carefully every day - wear comfortable, cotton socks - wear comfortable, well-fitting shoes -discuss with provider wound center referral for left foot wound -continue to see podiatry for toe nail maintenance     Why is this important?   Good foot care is very important when you have diabetes.  There are many things you can do to keep your feet healthy and catch a problem early.    Notes:  04/262022 Last POD visit [id number]     . [facility]) Set My Target A1C   Not on track    Timeframe:  Long-Range Goal Priority:  High Start Date:      01314388                       Expected End Date:  87579728                 Follow Up Date 20601561 - set target A1C -discuss with provider target Hgb A1C, current is 7.6    Why is this important?   Your target A1C is decided together by you and your doctor.  It is based on several things like your age and other health issues.    Notes:  Goal 7.0 [id number] Patient is currently at 8.2

## 2021-01-29 ENCOUNTER — Other Ambulatory Visit: Payer: Self-pay | Admitting: Internal Medicine

## 2021-01-29 DIAGNOSIS — M159 Polyosteoarthritis, unspecified: Secondary | ICD-10-CM

## 2021-01-29 DIAGNOSIS — E1142 Type 2 diabetes mellitus with diabetic polyneuropathy: Secondary | ICD-10-CM

## 2021-01-29 DIAGNOSIS — M5416 Radiculopathy, lumbar region: Secondary | ICD-10-CM

## 2021-02-03 ENCOUNTER — Ambulatory Visit (INDEPENDENT_AMBULATORY_CARE_PROVIDER_SITE_OTHER): Payer: Medicare Other | Admitting: Internal Medicine

## 2021-02-03 ENCOUNTER — Encounter: Payer: Self-pay | Admitting: Internal Medicine

## 2021-02-03 VITALS — BP 110/60 | HR 70

## 2021-02-03 DIAGNOSIS — K439 Ventral hernia without obstruction or gangrene: Secondary | ICD-10-CM | POA: Diagnosis not present

## 2021-02-03 NOTE — Patient Instructions (Signed)
Please read the information I provided and check out the websites I recommended to consider what you can do to change eating.  I will see if there is a clinic that can help you change eating and lose weight so you can have hernia fixed.  I appreciate the opportunity to care for you. Gatha Mayer, MD, [name]

## 2021-02-03 NOTE — Progress Notes (Signed)
[name] 65 y.o. Jul 14, 1956 [id number]  Assessment & Plan:   Encounter Diagnoses  Name Primary?  . Ventral hernia without obstruction or gangrene Yes  . Severe obesity (BMI >= 40) (HCC)     I explained as he is aware that surgery for the hernia is not an option and weight loss is in his best interest for many reasons.  He said he was willing to try to enroll in obesity clinic or bariatric clinic.  He was up at [facility] to try to see if he could have surgery and they told him he needed to lose 100s of pounds, 300 to be exact he says.  He is too large for the surgery it would be too risky.  I do not know if he has enough insight here but I will see if there is a clinic he can go to for behavioral modification and changing how he eats.  A keto diet might work with him if he is willing to comply.  Though he denies it anybody of this size is quite likely to have an eating disorder/food addiction issue in my opinion.   Subjective:   Chief Complaint: Hernia  HPI  [name] is here for follow-up he has a history of sigmoid colon cancer resected in 2007, morbid obesity and congestive heart failure and repeated concerns about a ventral hernia.  He says it is getting bigger and hurting some.  He is in his scooter as usual.  He says that he "does not eat that much".  Does admit to eating abnormally years ago which led to the obesity.  He is concerned about this hernia he has been going to the gym some mental bulge.  He feels like it bulges more when he is constipated.  He is on a regimen that works reasonably well.  He has an indwelling Lap-Band port though I do not think the Lap-Band is constricted.    When quizzed about his diet he denies drinking a lot of sugary sodas or sugary foods.  He does drink juice at times.  It is hard to get that much detail about his diet at this point.  He does not think he is addicted to food.  Allergies  Allergen Reactions  . Shellfish Allergy Anaphylaxis  .  Lisinopril Cough  . Lovastatin Itching   Current Meds  Medication Sig  . Accu-Chek Softclix Lancets lancets CHECK BLOOD SUGAR 2 TIMES  DAILY  . albuterol (VENTOLIN HFA) 108 (90 Base) MCG/ACT inhaler USE 2 INHALATIONS BY MOUTH  EVERY 6 HOURS AS NEEDED FOR WHEEZING OR SHORTNESS OF  BREATH  . aspirin EC 81 MG tablet Take 1 tablet (81 mg total) by mouth daily.  Marland Kitchen atorvastatin (LIPITOR) 40 MG tablet TAKE 1 TABLET BY MOUTH  DAILY  . bisoprolol (ZEBETA) 10 MG tablet TAKE 1 TABLET BY MOUTH EVERY EVENING.  . blood glucose meter kit and supplies KIT Use to check blood sugar three times daily. Dispense Freestyle Libre according to insurance preference. DX: E11.8  . Blood Glucose Monitoring Suppl (ACCU-CHEK AVIVA CONNECT) w/Device KIT 1 Act by Does not apply route 3 (three) times daily as needed.  . Blood Glucose Monitoring Suppl (ACCU-CHEK GUIDE) w/Device KIT   . Cholecalciferol 50 MCG (2000 UT) TABS Take 1 tablet (2,000 Units total) by mouth daily.  . ciclopirox (PENLAC) 8 % solution Apply topically at bedtime. Apply over nail and surrounding skin. Apply daily over previous coat. After seven (7) days, may remove with alcohol and  continue cycle.  . cyanocobalamin 2000 MCG tablet Take 1 tablet (2,000 mcg total) by mouth daily.  . cycloSPORINE (RESTASIS) 0.05 % ophthalmic emulsion Place 1 drop into both eyes 2 (two) times daily.  Marland Kitchen docusate sodium (COLACE) 100 MG capsule Take 100 mg by mouth 2 (two) times daily.  . ferrous sulfate (FEROSUL) 325 (65 FE) MG tablet Take 1 tablet (325 mg total) by mouth 2 (two) times daily with a meal.  . furosemide (LASIX) 80 MG tablet Take 1 tablet (80 mg total) by mouth 2 (two) times daily. Take extra 0.5 tab as needed  . Glucagon (GVOKE HYPOPEN 2-PACK) 1 MG/0.2ML SOAJ Inject 1 Act into the skin daily as needed.  Marland Kitchen glucose blood (ACCU-CHEK AVIVA PLUS) test strip Use to test blood sugar twice daily. DX E11.8  . HYDROcodone-acetaminophen (NORCO) 10-325 MG tablet TAKE 1 TABLET  BY MOUTH EVERY 6 HOURS AS NEEDED.  Marland Kitchen insulin detemir (LEVEMIR FLEXTOUCH) 100 UNIT/ML FlexPen Inject 240 Units into the skin daily.  . Insulin Pen Needle (ULTICARE SHORT PEN NEEDLES) 31G X 8 MM MISC USE WITH INSULIN PENS DAILY.  Marland Kitchen levothyroxine (SYNTHROID) 50 MCG tablet TAKE 1 TABLET BY MOUTH  DAILY BEFORE BREAKFAST  . losartan (COZAAR) 50 MG tablet Take 1 tablet (50 mg total) by mouth daily.  . Multiple Vitamins-Minerals (MULTIVITAMIN ADULT PO) Take by mouth.  Marland Kitchen omeprazole (PRILOSEC) 40 MG capsule Take 1 tablet by mouth 30 min before breakfast.  . potassium chloride SA (KLOR-CON) 20 MEQ tablet Take 1 tablet (20 mEq total) by mouth 2 (two) times daily.  Marland Kitchen SYNJARDY 12.01-999 MG TABS TAKE 1 TABLET BY MOUTH  TWICE DAILY  . thiamine (VITAMIN B-1) 100 MG tablet Take 1 tablet (100 mg total) by mouth daily.   Past Medical History:  Diagnosis Date  . Anemia    iron  . CHF (congestive heart failure) (HCC)    Presumed diastolic. Echo (06/07) w.EF 45%, severe posterior HK, mild LV hypertrophy, No further work-up of abnormal echo was done. pt denies CHF  . Colon cancer [place]) 2007   s/p sigmoid colectomy  . Diabetes mellitus    Has Hx of diabetic foot ulcer & peripheral neuropathy  type2  . Dyspnea    w/ activity  . History of PFTs 05/2009   Mild Obstructive defect  . HTN (hypertension)   . Hyperlipidemia   . Morbid obesity ([place])   . OSA on CPAP   . Pneumonia    week ago    Past Surgical History:  Procedure Laterality Date  . BARIATRIC SURGERY  12/2009   Lap Band/At [facility]  . COLONOSCOPY  multiple  . COLONOSCOPY WITH PROPOFOL N/A 11/26/2012   Procedure: COLONOSCOPY WITH PROPOFOL;  Surgeon: Gatha Mayer, MD;  Location: [facility];  Service: Endoscopy;  Laterality: N/A;  may need pre appt. with anesthesia due to morbid obesity  . COLONOSCOPY WITH PROPOFOL N/A 07/11/2017   Procedure: COLONOSCOPY WITH PROPOFOL;  Surgeon: Gatha Mayer, MD;  Location: [facility];  Service: Endoscopy;   Laterality: N/A;  . [place]   '80/Right  '84/Left  . EYE SURGERY Right 05/2017  . HERNIA REPAIR    . HIP SURGERY     bilateral  . INCISIONAL HERNIA REPAIR N/A 12/18/2013   Procedure:  REPAIR OF INCARCERATED INCISIONAL HERNIA;  Surgeon: Rolm Bookbinder, MD;  Location: [place];  Service: General;  Laterality: N/A;  . INSERTION OF MESH N/A 12/18/2013   Procedure: INSERTION OF MESH;  Surgeon: Rolm Bookbinder, MD;  Location: [place];  Service: General;  Laterality: N/A;  . Sigmoid Colectomy  10/2005   [place]  . 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   In new relationship (04/2009) - girlfriend helps taste for cooking business and helps monitor for ulcers.   Regular Exercise- No      family history includes CAD in his mother; Congestive Heart Failure in his mother; Diabetes in his father and mother; Heart attack (age of onset: 17) in his brother; Heart failure in his brother; Hepatitis in his mother.   Review of Systems As per HPI  Objective:   Physical Exam BP 110/60   Pulse 70  The patient is severely obese and in a scooter with no acute distress The abdomen is very large and obese. Port in mid abd midline scar small lower epigastric ventral hernia soft and NT

## 2021-02-05 ENCOUNTER — Other Ambulatory Visit: Payer: Self-pay | Admitting: Internal Medicine

## 2021-02-19 ENCOUNTER — Telehealth: Payer: Medicare Other | Admitting: Internal Medicine

## 2021-02-19 DIAGNOSIS — M5416 Radiculopathy, lumbar region: Secondary | ICD-10-CM

## 2021-02-19 DIAGNOSIS — M159 Polyosteoarthritis, unspecified: Secondary | ICD-10-CM

## 2021-02-19 DIAGNOSIS — E1142 Type 2 diabetes mellitus with diabetic polyneuropathy: Secondary | ICD-10-CM

## 2021-02-19 NOTE — Telephone Encounter (Signed)
[name] from [employer] has called to let us know that the rollator walker is not covered by [employer].  Please submit PA form.  Phone- [phone]

## 2021-02-26 NOTE — Telephone Encounter (Signed)
Ref# [id number] I have called [employer] and informed them of below.  After a conversation with Publishing copy and co-worker we do not do PA's for DME. It would have to come from the DME company. If insurance does not pay then the pt would have to decide if he would like to pay out of pocket.   Also after analyzing the chart for the PA in question. PCP never Rx'd a order for a rollator walker.

## 2021-03-01 ENCOUNTER — Telehealth: Payer: Self-pay | Admitting: Podiatry

## 2021-03-01 NOTE — Telephone Encounter (Signed)
Pt left message stating he ordered a pair of shoes about a month ago and has not heard anything.  Upon checking the documents were just received 6.10 so the inserts are in production. I told pt this and they should be here by the end of the month and I would call to schedule an appt to pick them up.

## 2021-03-02 ENCOUNTER — Other Ambulatory Visit: Payer: Self-pay | Admitting: *Deleted

## 2021-03-02 NOTE — Patient Outreach (Signed)
[facility]) Care Management  03/02/2021  [name] 1955/12/21 [id number]   RN Health Coach telephone call to patient.  Hipaa compliance verified. Patient called last night to nurse line asking about someone to come to his house to do the [place] booster. RN gave patient the name and number of pharmacy to call to set up date and time.   [place] [facility] Management [phone]

## 2021-03-09 ENCOUNTER — Telehealth: Payer: Self-pay | Admitting: Internal Medicine

## 2021-03-09 ENCOUNTER — Other Ambulatory Visit: Payer: Self-pay | Admitting: Internal Medicine

## 2021-03-09 DIAGNOSIS — M5416 Radiculopathy, lumbar region: Secondary | ICD-10-CM

## 2021-03-09 DIAGNOSIS — E1142 Type 2 diabetes mellitus with diabetic polyneuropathy: Secondary | ICD-10-CM

## 2021-03-09 DIAGNOSIS — M159 Polyosteoarthritis, unspecified: Secondary | ICD-10-CM

## 2021-03-09 DIAGNOSIS — M8949 Other hypertrophic osteoarthropathy, multiple sites: Secondary | ICD-10-CM

## 2021-03-09 MED ORDER — HYDROCODONE-ACETAMINOPHEN 10-325 MG PO TABS: 1.0000 | ORAL_TABLET | Freq: Four times a day (QID) | ORAL | 0 refills | Status: DC | PRN

## 2021-03-09 NOTE — Telephone Encounter (Signed)
   Patient requesting refill for HYDROcodone-acetaminophen ([place]) 10-325 MG tablet  [place], [place]

## 2021-03-10 ENCOUNTER — Ambulatory Visit: Payer: Medicare Other | Admitting: Internal Medicine

## 2021-03-10 NOTE — Telephone Encounter (Signed)
    Cess with [employer] called and said that the rollator walker is covered. She said that the order can be sent to [facility]. Please advise   Fax: [phone] Phone: [phone]

## 2021-03-11 DIAGNOSIS — R609 Edema, unspecified: Secondary | ICD-10-CM | POA: Diagnosis not present

## 2021-03-11 DIAGNOSIS — L98491 Non-pressure chronic ulcer of skin of other sites limited to breakdown of skin: Secondary | ICD-10-CM | POA: Diagnosis not present

## 2021-03-11 DIAGNOSIS — E1140 Type 2 diabetes mellitus with diabetic neuropathy, unspecified: Secondary | ICD-10-CM | POA: Diagnosis not present

## 2021-03-11 DIAGNOSIS — M150 Primary generalized (osteo)arthritis: Secondary | ICD-10-CM | POA: Diagnosis not present

## 2021-03-12 NOTE — Addendum Note (Signed)
Addended by: Hinda Kehr on: 03/12/2021 10:20 AM   Modules accepted: Orders

## 2021-03-12 NOTE — Telephone Encounter (Signed)
   Bill w/ [employer] called and said that the bariatric rollator walker needs to be sent to [place]. Please advise   Fax: [phone] Phone: [phone]; ext: [phone]

## 2021-03-15 ENCOUNTER — Other Ambulatory Visit: Payer: Self-pay | Admitting: Internal Medicine

## 2021-03-15 DIAGNOSIS — E785 Hyperlipidemia, unspecified: Secondary | ICD-10-CM

## 2021-03-15 DIAGNOSIS — E039 Hypothyroidism, unspecified: Secondary | ICD-10-CM

## 2021-03-17 ENCOUNTER — Other Ambulatory Visit: Payer: Self-pay | Admitting: *Deleted

## 2021-03-17 NOTE — Addendum Note (Signed)
Addended by: Hinda Kehr on: 03/17/2021 08:48 AM   Modules accepted: Orders

## 2021-03-17 NOTE — Patient Outreach (Signed)
[facility]) Care Management  03/17/2021  Macon Sandiford June 20, 1956 [id number]   RN Health Coach attempted follow up outreach call to patient.  Patient was unavailable. HIPPA compliance voicemail message left with return callback number.  Plan: RN will call patient again within 30 days.  [place] [facility] [phone]

## 2021-03-17 NOTE — Telephone Encounter (Signed)
Order has been signed and faxed back.

## 2021-03-17 NOTE — Patient Outreach (Signed)
[facility]) Care Management  03/17/2021  [name] 12-25-1955 [id number]   [place] received return  telephone call from patient.  [employer] compliance verified. RN is following through to make sure that patient received his COVID booster that was arranged. Per patient he did receive the vaccine. Patient stated that he did indicate a [place] booster but he was given the moderna vaccine. Patient is stating that he is having some discomfort in shoulders, back and ankles. RN explained to patient to monitor symptoms and if increase make his Dr aware.  PLAN: RN will follow up outreach within the month of September.   [place] [facility] [phone]

## 2021-03-29 ENCOUNTER — Telehealth: Payer: Self-pay | Admitting: Podiatry

## 2021-03-29 ENCOUNTER — Emergency Department (HOSPITAL_COMMUNITY)
Admission: EM | Admit: 2021-03-29 | Discharge: 2021-03-30 | Disposition: A | Discharge status: Home / Self Care | Payer: Medicare Other | Attending: Emergency Medicine | Admitting: Emergency Medicine

## 2021-03-29 ENCOUNTER — Emergency Department (HOSPITAL_COMMUNITY): Payer: Medicare Other

## 2021-03-29 DIAGNOSIS — E1140 Type 2 diabetes mellitus with diabetic neuropathy, unspecified: Secondary | ICD-10-CM | POA: Diagnosis not present

## 2021-03-29 DIAGNOSIS — Z794 Long term (current) use of insulin: Secondary | ICD-10-CM | POA: Insufficient documentation

## 2021-03-29 DIAGNOSIS — G4489 Other headache syndrome: Secondary | ICD-10-CM | POA: Diagnosis not present

## 2021-03-29 DIAGNOSIS — N183 Chronic kidney disease, stage 3 unspecified: Secondary | ICD-10-CM | POA: Diagnosis not present

## 2021-03-29 DIAGNOSIS — R600 Localized edema: Secondary | ICD-10-CM | POA: Diagnosis not present

## 2021-03-29 DIAGNOSIS — R1111 Vomiting without nausea: Secondary | ICD-10-CM | POA: Diagnosis not present

## 2021-03-29 DIAGNOSIS — I130 Hypertensive heart and chronic kidney disease with heart failure and stage 1 through stage 4 chronic kidney disease, or unspecified chronic kidney disease: Secondary | ICD-10-CM | POA: Insufficient documentation

## 2021-03-29 DIAGNOSIS — K828 Other specified diseases of gallbladder: Secondary | ICD-10-CM | POA: Diagnosis not present

## 2021-03-29 DIAGNOSIS — R0789 Other chest pain: Secondary | ICD-10-CM

## 2021-03-29 DIAGNOSIS — Z7982 Long term (current) use of aspirin: Secondary | ICD-10-CM | POA: Diagnosis not present

## 2021-03-29 DIAGNOSIS — E039 Hypothyroidism, unspecified: Secondary | ICD-10-CM | POA: Insufficient documentation

## 2021-03-29 DIAGNOSIS — I129 Hypertensive chronic kidney disease with stage 1 through stage 4 chronic kidney disease, or unspecified chronic kidney disease: Secondary | ICD-10-CM | POA: Diagnosis not present

## 2021-03-29 DIAGNOSIS — Z85038 Personal history of other malignant neoplasm of large intestine: Secondary | ICD-10-CM | POA: Diagnosis not present

## 2021-03-29 DIAGNOSIS — R079 Chest pain, unspecified: Secondary | ICD-10-CM | POA: Diagnosis not present

## 2021-03-29 DIAGNOSIS — K439 Ventral hernia without obstruction or gangrene: Secondary | ICD-10-CM | POA: Diagnosis not present

## 2021-03-29 DIAGNOSIS — Z79899 Other long term (current) drug therapy: Secondary | ICD-10-CM | POA: Insufficient documentation

## 2021-03-29 DIAGNOSIS — E1122 Type 2 diabetes mellitus with diabetic chronic kidney disease: Secondary | ICD-10-CM | POA: Insufficient documentation

## 2021-03-29 DIAGNOSIS — I5032 Chronic diastolic (congestive) heart failure: Secondary | ICD-10-CM | POA: Insufficient documentation

## 2021-03-29 DIAGNOSIS — K8020 Calculus of gallbladder without cholecystitis without obstruction: Secondary | ICD-10-CM

## 2021-03-29 DIAGNOSIS — R202 Paresthesia of skin: Secondary | ICD-10-CM | POA: Diagnosis not present

## 2021-03-29 DIAGNOSIS — M79603 Pain in arm, unspecified: Secondary | ICD-10-CM | POA: Diagnosis not present

## 2021-03-29 NOTE — ED Triage Notes (Signed)
Pt BIB [facility] for eval of tingling arm pain since 10am, from L to R side; gripping pain & HA. No neuro deficits, but 2x vomiting episodes yesterday. Pt denies CP, NVD, no additional complaints  18LAC  VSS, NAD on pt arrival

## 2021-03-29 NOTE — ED Notes (Signed)
Pt wants EMS IV out d/t wait time. Explained the process of triage, waiting for a room, lab work, and the overall wait time in the ER.

## 2021-03-29 NOTE — Telephone Encounter (Signed)
Pt left message checking on diabetic shoes..  I returned call and told pt we have had a manufacturer delay and I was told  they should have everything backordered shipped within 2 wks. I did ask pt to call back if he has not heard from me within 3 wks.

## 2021-03-29 NOTE — ED Provider Notes (Signed)
Emergency Medicine Provider Triage Evaluation Note  [name] , a 65 y.o. male  was evaluated in triage.  Pt complains of central, pressure, vice-like chest pain which began around 10AM with some associated paresthesias in the RUE. Associated headache, emesis x 2, R leg paresthesias. Had some pain at the site of his ventral hernia in his epigastrium as well. Had constipated BM yesterday which is typical for him. Not passing gas since this time. Notes that CP has resolved. No medications PTA.  Review of Systems  Positive: Chest pain, abdominal pain, vomiting, paresthesias Negative: Fevers, syncope  Physical Exam  BP 125/66 (BP Location: Right Arm)   Pulse 71   Temp 98.6 F (37 C) (Oral)   Resp 18   SpO2 96%  Gen:   Awake, no distress   Resp:  Normal effort  MSK:   Moves extremities without difficulty  Other:  Morbidly obese. Firm hernia palpable in epigastrium. No overlying skin changes. No peritoneal signs.  Medical Decision Making  Medically screening exam initiated at 11:31 PM.  Appropriate orders placed.  [name] was informed that the remainder of the evaluation will be completed by another provider, this initial triage assessment does not replace that evaluation, and the importance of remaining in the ED until their evaluation is complete.  Chest pain Abdominal pain Paresthesias   Antonietta Breach, PA-C 03/29/21 2335    Merryl Hacker, MD 03/30/21 [phone]

## 2021-03-29 NOTE — ED Triage Notes (Signed)
Pt states he felt central chest pressure, states he also had a backache but unable to correlate the two. Numbness/tingling in legs, also states no feeling in his feet but that is not new. Abd pain related to hernia. Pt unable to pass gas, las BM yesterday

## 2021-03-30 ENCOUNTER — Emergency Department (HOSPITAL_COMMUNITY): Payer: Medicare Other

## 2021-03-30 ENCOUNTER — Emergency Department (HOSPITAL_BASED_OUTPATIENT_CLINIC_OR_DEPARTMENT_OTHER): Payer: Medicare Other

## 2021-03-30 DIAGNOSIS — M150 Primary generalized (osteo)arthritis: Secondary | ICD-10-CM | POA: Diagnosis not present

## 2021-03-30 DIAGNOSIS — K8020 Calculus of gallbladder without cholecystitis without obstruction: Secondary | ICD-10-CM | POA: Diagnosis not present

## 2021-03-30 DIAGNOSIS — M7989 Other specified soft tissue disorders: Secondary | ICD-10-CM

## 2021-03-30 DIAGNOSIS — K439 Ventral hernia without obstruction or gangrene: Secondary | ICD-10-CM | POA: Diagnosis not present

## 2021-03-30 DIAGNOSIS — R609 Edema, unspecified: Secondary | ICD-10-CM

## 2021-03-30 DIAGNOSIS — E1140 Type 2 diabetes mellitus with diabetic neuropathy, unspecified: Secondary | ICD-10-CM | POA: Diagnosis not present

## 2021-03-30 DIAGNOSIS — L98491 Non-pressure chronic ulcer of skin of other sites limited to breakdown of skin: Secondary | ICD-10-CM | POA: Diagnosis not present

## 2021-03-30 DIAGNOSIS — R1111 Vomiting without nausea: Secondary | ICD-10-CM | POA: Diagnosis not present

## 2021-03-30 DIAGNOSIS — K828 Other specified diseases of gallbladder: Secondary | ICD-10-CM | POA: Diagnosis not present

## 2021-03-30 LAB — COMPREHENSIVE METABOLIC PANEL
ALT: 17 U/L (ref 0–44)
AST: 22 U/L (ref 15–41)
Albumin: 3 g/dL — ABNORMAL LOW (ref 3.5–5.0)
Alkaline Phosphatase: 76 U/L (ref 38–126)
Anion gap: 9 (ref 5–15)
BUN: 36 mg/dL — ABNORMAL HIGH (ref 8–23)
CO2: 28 mmol/L (ref 22–32)
Calcium: 9.3 mg/dL (ref 8.9–10.3)
Chloride: 102 mmol/L (ref 98–111)
Creatinine, Ser: 2.06 mg/dL — ABNORMAL HIGH (ref 0.61–1.24)
GFR, Estimated: 36 mL/min — ABNORMAL LOW (ref >60)
Glucose, Bld: 151 mg/dL — ABNORMAL HIGH (ref 70–99)
Potassium: 4.2 mmol/L (ref 3.5–5.1)
Sodium: 139 mmol/L (ref 135–145)
Total Bilirubin: 0.8 mg/dL (ref 0.3–1.2)
Total Protein: 7.5 g/dL (ref 6.5–8.1)

## 2021-03-30 LAB — CBC WITH DIFFERENTIAL/PLATELET
Abs Immature Granulocytes: 0.04 10*3/uL (ref 0.00–0.07)
Basophils Absolute: 0 10*3/uL (ref 0.0–0.1)
Basophils Relative: 0 %
Eosinophils Absolute: 0.2 10*3/uL (ref 0.0–0.5)
Eosinophils Relative: 3 %
HCT: 40.1 % (ref 39.0–52.0)
Hemoglobin: 12 g/dL — ABNORMAL LOW (ref 13.0–17.0)
Immature Granulocytes: 1 %
Lymphocytes Relative: 18 %
Lymphs Abs: 1.6 10*3/uL (ref 0.7–4.0)
MCH: 28.6 pg (ref 26.0–34.0)
MCHC: 29.9 g/dL — ABNORMAL LOW (ref 30.0–36.0)
MCV: 95.7 fL (ref 80.0–100.0)
Monocytes Absolute: 0.5 10*3/uL (ref 0.1–1.0)
Monocytes Relative: 6 %
Neutro Abs: 6.5 10*3/uL (ref 1.7–7.7)
Neutrophils Relative %: 72 %
Platelets: 225 10*3/uL (ref 150–400)
RBC: 4.19 MIL/uL — ABNORMAL LOW (ref 4.22–5.81)
RDW: 15.3 % (ref 11.5–15.5)
WBC: 8.9 10*3/uL (ref 4.0–10.5)
nRBC: 0 % (ref 0.0–0.2)

## 2021-03-30 LAB — TROPONIN I (HIGH SENSITIVITY)
Troponin I (High Sensitivity): 22 ng/L — ABNORMAL HIGH (ref <18)
Troponin I (High Sensitivity): 22 ng/L — ABNORMAL HIGH (ref <18)

## 2021-03-30 LAB — BRAIN NATRIURETIC PEPTIDE: B Natriuretic Peptide: 89 pg/mL (ref 0.0–100.0)

## 2021-03-30 LAB — LIPASE, BLOOD: Lipase: 24 U/L (ref 11–51)

## 2021-03-30 LAB — D-DIMER, QUANTITATIVE: D-Dimer, Quant: 1.86 ug/mL-FEU — ABNORMAL HIGH (ref 0.00–0.50)

## 2021-03-30 NOTE — Progress Notes (Signed)
Lower extremity venous has been completed.   Preliminary results in CV Proc.   Abram Sander 03/30/2021 11:18 AM

## 2021-03-30 NOTE — ED Provider Notes (Signed)
[facility] EMERGENCY DEPARTMENT Provider Note   CSN: [id number] Arrival date & time: 03/29/21  2007     History No chief complaint on file.   [name] is a 65 y.o. male.  HPI     This is a 65 year old male with history of diabetes, hypertension, hyperlipidemia, CHF, abdominal wall hernia who presents with multiple complaints.  Patient reports that he was at home and got upset after his Internet went down and he was unable to get it reconnected.  He states he had onset of anterior chest pain and numbness in the bilateral hands.  It lasted approximately 20 minutes.  He had some associated shortness of breath and nausea with vomiting.  This was at approximately 10 AM.  He states he occasionally gets nausea and vomiting and stays constipated because "I hernia they will not fix."  Pain has been resolved.  There is no exertional component to the pain.  Denies weakness of the bilateral upper extremities.  Patient does report some asymmetric swelling of the left lower extremity.  Recently traveled home from [place].  Past Medical History:  Diagnosis Date   Anemia    iron   CHF (congestive heart failure) ([place])    Presumed diastolic. Echo (06/07) w.EF 45%, severe posterior HK, mild LV hypertrophy, No further work-up of abnormal echo was done. pt denies CHF   Colon cancer ([place]) 2007   s/p sigmoid colectomy   Diabetes mellitus    Has Hx of diabetic foot ulcer & peripheral neuropathy  type2   Dyspnea    w/ activity   History of PFTs 05/2009   Mild Obstructive defect   HTN (hypertension)    Hyperlipidemia    Morbid obesity (HCC)    OSA on CPAP    Pneumonia    week ago     Patient Active Problem List   Diagnosis Date Noted   Chronic idiopathic constipation 11/09/2020   PAD (peripheral artery disease) ([place]) 07/24/2020   Ulcer of left foot, limited to breakdown of skin ([place]) 07/23/2020   Drug-induced erectile dysfunction 05/22/2019   Onychomycosis of toenail  05/21/2019   Class 3 obesity with alveolar hypoventilation, serious comorbidity, and body mass index (BMI) of 50.0 to 59.9 in adult ([place]) 02/07/2019   Ventral hernia without obstruction or gangrene 05/31/2018   Therapeutic opioid-induced constipation (OIC) 05/31/2018   Long-term current use of opiate analgesic 02/15/2018   Left lumbar radiculopathy 01/29/2018   B12 deficiency 10/17/2017   Bleeding internal hemorrhoids    Insomnia 06/30/2017   Left eye affected by proliferative diabetic retinopathy with traction retinal detachment not involving macula, associated with type 2 diabetes mellitus ([employer]) 04/20/2017   Right eye affected by proliferative diabetic retinopathy with traction retinal detachment involving macula, associated with type 2 diabetes mellitus ([place]) 04/20/2017   Vitamin D deficiency 12/23/2016   Thiamine deficiency 08/02/2016   Primary osteoarthritis involving multiple joints 08/03/2015   Diabetes mellitus type II, uncontrolled ([place]) 05/23/2014   Herpesviral keratitis 05/23/2014   Diabetes mellitus due to underlying condition with both eyes affected by proliferative retinopathy without macular edema, with long-term current use of insulin ([place]) 05/23/2014   Low back pain 02/05/2014   Diabetic foot ulcer 02/05/2014   Iron deficiency anemia 01/09/2014   Osteoarthritis of hip 05/08/2013   Diabetic nephropathy ([place]) 05/07/2013   Bariatric surgery status 01/29/2013   Benign prostatic hyperplasia without lower urinary tract symptoms 01/11/2013   Hypothyroidism 01/11/2013   Colon cancer ([place]) 10/04/2012  Chronic diastolic CHF (congestive heart failure) ([place]) 04/13/2012   Preventative health care 04/08/2011   Diabetic peripheral neuropathy ([place]) 05/29/2008   OSA (obstructive sleep apnea) 10/01/2007   CARDIOMYOPATHY 10/01/2007   Type II diabetes mellitus with manifestations ([place]) 07/24/2007   Hyperlipidemia with target LDL less than 100 07/24/2007   Essential hypertension  07/24/2007    Past Surgical History:  Procedure Laterality Date   BARIATRIC SURGERY  12/2009   Lap Band/At [facility]   COLONOSCOPY  multiple   COLONOSCOPY WITH PROPOFOL N/A 11/26/2012   Procedure: COLONOSCOPY WITH PROPOFOL;  Surgeon: Gatha Mayer, MD;  Location: [facility];  Service: Endoscopy;  Laterality: N/A;  may need pre appt. with anesthesia due to morbid obesity   COLONOSCOPY WITH PROPOFOL N/A 07/11/2017   Procedure: COLONOSCOPY WITH PROPOFOL;  Surgeon: Gatha Mayer, MD;  Location: [facility];  Service: Endoscopy;  Laterality: N/A;   [place]   '80/Right  '84/Left   EYE SURGERY Right 05/2017   HERNIA REPAIR     HIP SURGERY     bilateral   INCISIONAL HERNIA REPAIR N/A 12/18/2013   Procedure:  REPAIR OF INCARCERATED INCISIONAL HERNIA;  Surgeon: Rolm Bookbinder, MD;  Location: [place];  Service: General;  Laterality: N/A;   INSERTION OF MESH N/A 12/18/2013   Procedure: INSERTION OF MESH;  Surgeon: Rolm Bookbinder, MD;  Location: [place];  Service: General;  Laterality: N/A;   Sigmoid Colectomy  10/2005   [place]   TONSILLECTOMY         Family History  Problem Relation Age of Onset   Hepatitis Mother        hepatitis C   Diabetes Mother    Congestive Heart Failure Mother    CAD Mother    Heart attack Brother 86   Diabetes Father    Heart failure Brother    Stomach cancer Neg Hx    Colon cancer Neg Hx     Social History   Tobacco Use   Smoking status: Never   Smokeless tobacco: Never  Vaping Use   Vaping Use: Never used  Substance Use Topics   Alcohol use: Not Currently    Alcohol/week: 0.0 standard drinks    Comment: states he does not drink    Drug use: No    Home Medications Prior to Admission medications   Medication Sig Start Date End Date Taking? Authorizing Provider  Accu-Chek Softclix Lancets lancets CHECK BLOOD SUGAR 2 TIMES  DAILY 10/21/20   [name], MD  Albuterol Sulfate (PROAIR RESPICLICK) 601 (90 Base) MCG/ACT AEPB  Inhale 1 puff into the lungs 4 (four) times daily as needed. 02/05/21   [name], MD  aspirin EC 81 MG tablet Take 1 tablet (81 mg total) by mouth daily. 04/17/20   [name], MD  atorvastatin (LIPITOR) 40 MG tablet TAKE 1 TABLET BY MOUTH DAILY. 03/15/21   [name], MD  bisoprolol (ZEBETA) 10 MG tablet TAKE 1 TABLET BY MOUTH EVERY EVENING. 01/05/21   [name], MD  blood glucose meter kit and supplies KIT Use to check blood sugar three times daily. Dispense Freestyle Libre according to insurance preference. DX: E11.8 10/21/20   [name], MD  Blood Glucose Monitoring Suppl (ACCU-CHEK AVIVA CONNECT) w/Device KIT 1 Act by Does not apply route 3 (three) times daily as needed. 11/09/20   [name], MD  Blood Glucose Monitoring Suppl (ACCU-CHEK GUIDE) w/Device KIT  11/12/20  [provider]  Cholecalciferol 50 MCG (2000 UT) TABS Take 1 tablet (2,000 Units total) by mouth daily. 04/17/20   [name], MD  ciclopirox (PENLAC) 8 % solution Apply topically at bedtime. Apply over nail and surrounding skin. Apply daily over previous coat. After seven (7) days, may remove with alcohol and continue cycle. 11/09/20   [name], MD  cyanocobalamin 2000 MCG tablet Take 1 tablet (2,000 mcg total) by mouth daily. 04/17/20   [name], MD  cycloSPORINE (RESTASIS) 0.05 % ophthalmic emulsion Place 1 drop into both eyes 2 (two) times daily. 04/17/20   [name], MD  docusate sodium (COLACE) 100 MG capsule Take 100 mg by mouth 2 (two) times daily.    [provider]  ferrous sulfate (FEROSUL) 325 (65 FE) MG tablet Take 1 tablet (325 mg total) by mouth 2 (two) times daily with a meal. 04/17/20   [name], MD  furosemide (LASIX) 80 MG tablet Take 1 tablet (80 mg total) by mouth 2 (two) times daily. Take extra 0.5 tab as needed 12/08/20   [name], MD  Glucagon (GVOKE HYPOPEN 2-PACK) 1 MG/0.2ML SOAJ Inject 1 Act into the skin daily as needed.  07/23/20   [name], MD  glucose blood (ACCU-CHEK AVIVA PLUS) test strip Use to test blood sugar twice daily. DX E11.8 10/21/20   [name], MD  HYDROcodone-acetaminophen (NORCO) 10-325 MG tablet Take 1 tablet by mouth every 6 (six) hours as needed. 03/09/21   [name], MD  insulin detemir (LEVEMIR FLEXTOUCH) 100 UNIT/ML FlexPen Inject 240 Units into the skin daily. 12/28/20   [name], MD  Insulin Pen Needle (ULTICARE SHORT PEN NEEDLES) 31G X 8 MM MISC USE WITH INSULIN PENS DAILY. 04/17/20   [name], MD  levothyroxine (SYNTHROID) 50 MCG tablet TAKE 1 TABLET BY MOUTH DAILY BEFORE BREAKFAST. 03/15/21   [name], MD  losartan (COZAAR) 50 MG tablet Take 1 tablet (50 mg total) by mouth daily. 07/24/20   [name], MD  Multiple Vitamins-Minerals (MULTIVITAMIN ADULT PO) Take by mouth.    [provider]  omeprazole (PRILOSEC) 40 MG capsule Take 1 tablet by mouth 30 min before breakfast. 02/03/17   [place], Amy S, PA-C  potassium chloride SA (KLOR-CON) 20 MEQ tablet Take 1 tablet (20 mEq total) by mouth 2 (two) times daily. 07/24/20   [name], MD  SYNJARDY 12.01-999 MG TABS TAKE 1 TABLET BY MOUTH  TWICE DAILY 01/10/21   [name], MD  thiamine (VITAMIN B-1) 100 MG tablet Take 1 tablet (100 mg total) by mouth daily. 04/17/20   [name], MD    Allergies    Shellfish allergy, Lisinopril, and Lovastatin  Review of Systems   Review of Systems  Constitutional:  Negative for fever.  Respiratory:  Negative for shortness of breath.   Cardiovascular:  Positive for chest pain and leg swelling.  Gastrointestinal:  Positive for nausea and vomiting. Negative for anal bleeding.  Genitourinary:  Negative for dysuria.  All other systems reviewed and are negative.  Physical Exam Updated Vital Signs BP (!) 101/57   Pulse 65   Temp 97.7 F (36.5 C) (Oral)   Resp 13   SpO2 100%   Physical Exam Vitals and nursing note reviewed.   Constitutional:      Appearance: He is well-developed.     Comments: Morbidly obese, nontoxic-appearing  HENT:  Head: Normocephalic and atraumatic.     Mouth/Throat:     Mouth: Mucous membranes are moist.  Eyes:     Pupils: Pupils are equal, round, and reactive to light.  Cardiovascular:     Rate and Rhythm: Normal rate and regular rhythm.     Heart sounds: Normal heart sounds. No murmur heard. Pulmonary:     Effort: Pulmonary effort is normal. No respiratory distress.     Breath sounds: Normal breath sounds. No wheezing.  Abdominal:     General: Bowel sounds are normal.     Palpations: Abdomen is soft.     Tenderness: There is no abdominal tenderness. There is no rebound.     Comments: Palpated ventral hernia, unable to reduce, extensive abdominal scarring noted, no overlying skin changes over the hernia  Musculoskeletal:     Cervical back: Neck supple.     Comments: Left greater than right lower extremity edema, pitting  Lymphadenopathy:     Cervical: No cervical adenopathy.  Skin:    General: Skin is warm and dry.  Neurological:     Mental Status: He is alert and oriented to person, place, and time.     Comments: 5 out of 5 strength all 4 extremities, no objective sensory deficits  Psychiatric:        Mood and Affect: Mood normal.    ED Results / Procedures / Treatments   Labs (all labs ordered are listed, but only abnormal results are displayed) Labs Reviewed  CBC WITH DIFFERENTIAL/PLATELET - Abnormal; Notable for the following components:      Result Value   RBC 4.19 (*)    Hemoglobin 12.0 (*)    MCHC 29.9 (*)    All other components within normal limits  COMPREHENSIVE METABOLIC PANEL - Abnormal; Notable for the following components:   Glucose, Bld 151 (*)    BUN 36 (*)    Creatinine, Ser 2.06 (*)    Albumin 3.0 (*)    GFR, Estimated 36 (*)    All other components within normal limits  TROPONIN I (HIGH SENSITIVITY) - Abnormal; Notable for the following  components:   Troponin I (High Sensitivity) 22 (*)    All other components within normal limits  TROPONIN I (HIGH SENSITIVITY) - Abnormal; Notable for the following components:   Troponin I (High Sensitivity) 22 (*)    All other components within normal limits  BRAIN NATRIURETIC PEPTIDE  D-DIMER, QUANTITATIVE    EKG EKG Interpretation  Date/Time:  Tuesday March 30 2021 00:01:15 EDT Ventricular Rate:  69 PR Interval:  168 QRS Duration: 112 QT Interval:  432 QTC Calculation: 462 R Axis:   -19 Text Interpretation: Normal sinus rhythm Normal ECG Confirmed by Thayer Jew [phone]) on 03/30/2021 4:59:01 AM  Radiology DG Chest 2 View  Result Date: 03/30/2021 CLINICAL DATA:  Chest pain EXAM: CHEST - 2 VIEW COMPARISON:  11/26/2015 FINDINGS: The heart size and mediastinal contours are within normal limits. Both lungs are clear. The visualized skeletal structures are unremarkable. IMPRESSION: No active cardiopulmonary disease. Electronically Signed   By: Inez Catalina M.D.   On: 03/30/2021 00:05    Procedures Procedures   Medications Ordered in ED Medications - No data to display  ED Course  I have reviewed the triage vital signs and the nursing notes.  Pertinent labs & imaging results that were available during my care of the patient were reviewed by me and considered in my medical decision making (see chart for details).  MDM Rules/Calculators/A&P                          Patient presents with multiple complaints.  He reports abdominal and chest pain.  He is also had some nausea and vomiting.  Currently symptoms have mostly resolved.  His vital signs are reassuring.  He is morbidly obese which limits his exam to some extent.  However he is not an extremis.  He does have a hernia that is not reducible but he states that this is unchanged.  Regarding his chest pain, EKG obtained.  She has no evidence of ischemia or arrhythmia.  He did recently travel and reports some asymmetric lower  extremity edema.  Will screen for blood clots with a D-dimer.  Troponin is reassuring.  Regarding his abdominal pain, he has this ventral hernia.  He has had some nausea and vomiting.  Obstruction is a consideration.  Because of this, CT scan was obtained and shows no evidence of obstruction.  D-dimer is elevated.  However, given his creatinine, he is not a candidate for CT imaging.  Will order VQ scan.  Patient signed out to oncoming provider.  Final Clinical Impression(s) / ED Diagnoses Final diagnoses:  Atypical chest pain  Ventral hernia without obstruction or gangrene  Gallstones  Stage 3 chronic kidney disease, unspecified whether stage 3a or 3b CKD ([place])    Rx / DC Orders ED Discharge Orders     None        Merryl Hacker, MD 03/31/21 [phone]

## 2021-03-30 NOTE — ED Notes (Signed)
Pt to CT

## 2021-03-30 NOTE — ED Notes (Signed)
Lab to add-on lipase  

## 2021-03-30 NOTE — ED Provider Notes (Signed)
Signed out that VQ scan pending.   Radiology indicates pt is too large for VQ scan, and cant get CT due to renal fxn.   On recheck of patient - pt denies any chest pain or discomfort. Denies sob, denies new/worsening doe.  Earlier abd pain/nausea, improved, and is requesting food/drink. Abd is soft and non tender. Gallstones noted on imaging.   Pt is noted to have left leg that is mildly larger/?swollen as compared to right. Pt indicates present 'for long while', and unsure of cause. Will get dvt study.   Dvt study neg for dvt.   Pt continues to deny any chest pain or discomfort, and feels ready for d/c.   Rec pcp f/u.   Return precautions provided.      Lajean Saver, MD 03/30/21 1157

## 2021-03-30 NOTE — Discharge Instructions (Addendum)
It was our pleasure to provide your ER care today - we hope that you feel better.   Your ct scan was read as showing: 1. No acute findings are noted in the abdomen or pelvis to account  for the patient's symptoms. 2. There is an epigastric ventral hernia containing a portion of the mid transverse colon, similar to the prior study from 2021. No evidence of associated bowel incarceration or obstruction at this time. 3. Cholelithiasis without evidence of acute cholecystitis. 4. Aortic atherosclerosis. 5. Areas of cylindrical bronchiectasis with thickening of the peribronchovascular interstitium and peribronchovascular ground-glass attenuation in the lung bases bilaterally, most pronounced in the left lower lobe. These findings are similar to but progressive when compared to remote prior study, likely reflective of a chronic indolent atypical infectious process such as MAI (mycobacterium avium intracellulare). Nonemergent outpatient referral to Pulmonology for further clinical evaluation is suggested.   For gallstones and hernia, follow up with general surgery in the next 1-2 weeks - call office to arrange appointment.   For chest findings noted above, follow up with pulmonary (lung) specialist in the next 1-2 weeks - call office to arrange appointment. Also follow up closely with primary care doctor in the next couple weeks - have them review above CT findings and coordinate follow up for above.   Return to ER if worse, new symptoms, new, worsening or severe abdominal pain, persistent vomiting, fevers, chest pain, trouble breathing, or other concern.

## 2021-03-30 NOTE — ED Notes (Signed)
Given lunch per Dr Ashok Cordia.

## 2021-03-30 NOTE — ED Notes (Signed)
Informed RN and NT of monitor reading; RN stated an Echo was being performed.

## 2021-04-06 ENCOUNTER — Other Ambulatory Visit: Payer: Self-pay

## 2021-04-06 ENCOUNTER — Ambulatory Visit (INDEPENDENT_AMBULATORY_CARE_PROVIDER_SITE_OTHER): Payer: Medicare Other | Admitting: Internal Medicine

## 2021-04-06 ENCOUNTER — Encounter: Payer: Self-pay | Admitting: Internal Medicine

## 2021-04-06 VITALS — BP 126/84 | HR 69 | Temp 97.9°F | Ht 73.0 in

## 2021-04-06 DIAGNOSIS — U071 COVID-19: Secondary | ICD-10-CM

## 2021-04-06 DIAGNOSIS — E083593 Diabetes mellitus due to underlying condition with proliferative diabetic retinopathy without macular edema, bilateral: Secondary | ICD-10-CM | POA: Diagnosis not present

## 2021-04-06 DIAGNOSIS — Z23 Encounter for immunization: Secondary | ICD-10-CM | POA: Insufficient documentation

## 2021-04-06 DIAGNOSIS — J40 Bronchitis, not specified as acute or chronic: Secondary | ICD-10-CM | POA: Insufficient documentation

## 2021-04-06 DIAGNOSIS — Z9884 Bariatric surgery status: Secondary | ICD-10-CM

## 2021-04-06 DIAGNOSIS — D508 Other iron deficiency anemias: Secondary | ICD-10-CM | POA: Diagnosis not present

## 2021-04-06 DIAGNOSIS — K8020 Calculus of gallbladder without cholecystitis without obstruction: Secondary | ICD-10-CM | POA: Insufficient documentation

## 2021-04-06 DIAGNOSIS — R058 Other specified cough: Secondary | ICD-10-CM | POA: Diagnosis not present

## 2021-04-06 DIAGNOSIS — Z0001 Encounter for general adult medical examination with abnormal findings: Secondary | ICD-10-CM | POA: Insufficient documentation

## 2021-04-06 DIAGNOSIS — E559 Vitamin D deficiency, unspecified: Secondary | ICD-10-CM

## 2021-04-06 DIAGNOSIS — C189 Malignant neoplasm of colon, unspecified: Secondary | ICD-10-CM | POA: Diagnosis not present

## 2021-04-06 DIAGNOSIS — N400 Enlarged prostate without lower urinary tract symptoms: Secondary | ICD-10-CM

## 2021-04-06 DIAGNOSIS — E538 Deficiency of other specified B group vitamins: Secondary | ICD-10-CM | POA: Diagnosis not present

## 2021-04-06 DIAGNOSIS — E785 Hyperlipidemia, unspecified: Secondary | ICD-10-CM | POA: Diagnosis not present

## 2021-04-06 DIAGNOSIS — Z794 Long term (current) use of insulin: Secondary | ICD-10-CM | POA: Diagnosis not present

## 2021-04-06 DIAGNOSIS — E519 Thiamine deficiency, unspecified: Secondary | ICD-10-CM | POA: Diagnosis not present

## 2021-04-06 DIAGNOSIS — I10 Essential (primary) hypertension: Secondary | ICD-10-CM

## 2021-04-06 DIAGNOSIS — E1165 Type 2 diabetes mellitus with hyperglycemia: Secondary | ICD-10-CM | POA: Diagnosis not present

## 2021-04-06 DIAGNOSIS — E039 Hypothyroidism, unspecified: Secondary | ICD-10-CM

## 2021-04-06 LAB — LIPID PANEL
Cholesterol: 136 mg/dL (ref 0–200)
HDL: 33.4 mg/dL — ABNORMAL LOW (ref >39.00)
LDL Cholesterol: 78 mg/dL (ref 0–99)
NonHDL: 102.84
Total CHOL/HDL Ratio: 4
Triglycerides: 124 mg/dL (ref 0.0–149.0)
VLDL: 24.8 mg/dL (ref 0.0–40.0)

## 2021-04-06 LAB — IRON: Iron: 29 ug/dL — ABNORMAL LOW (ref 42–165)

## 2021-04-06 LAB — PSA: PSA: 1.85 ng/mL (ref 0.10–4.00)

## 2021-04-06 LAB — SARS-COV-2 IGG: SARS-COV-2 IgG: 50.02

## 2021-04-06 LAB — VITAMIN D 25 HYDROXY (VIT D DEFICIENCY, FRACTURES): VITD: 50.16 ng/mL (ref 30.00–100.00)

## 2021-04-06 LAB — FOLATE: Folate: 10.1 ng/mL (ref >5.9)

## 2021-04-06 LAB — TSH: TSH: 3.93 u[IU]/mL (ref 0.35–5.50)

## 2021-04-06 LAB — FERRITIN: Ferritin: 572 ng/mL — ABNORMAL HIGH (ref 22.0–322.0)

## 2021-04-06 LAB — VITAMIN B12: Vitamin B-12: 693 pg/mL (ref 211–911)

## 2021-04-06 LAB — HEMOGLOBIN A1C: Hgb A1c MFr Bld: 9.7 % — ABNORMAL HIGH (ref 4.6–6.5)

## 2021-04-06 MED ORDER — SHINGRIX 50 MCG/0.5ML IM SUSR: 0.5000 mL | Freq: Once | INTRAMUSCULAR | 1 refills | Status: AC

## 2021-04-06 MED ORDER — ACCRUFER 30 MG PO CAPS: 1.0000 | ORAL_CAPSULE | Freq: Two times a day (BID) | ORAL | 1 refills | Status: DC

## 2021-04-06 NOTE — Patient Instructions (Signed)
Health Maintenance, Male Adopting a healthy lifestyle and getting preventive care are important in promoting health and wellness. Ask your health care provider about: The right schedule for you to have regular tests and exams. Things you can do on your own to prevent diseases and keep yourself healthy. What should I know about diet, weight, and exercise? Eat a healthy diet  Eat a diet that includes plenty of vegetables, fruits, low-fat dairy products, and lean protein. Do not eat a lot of foods that are high in solid fats, added sugars, or sodium.  Maintain a healthy weight Body mass index (BMI) is a measurement that can be used to identify possible weight problems. It estimates body fat based on height and weight. Your health care provider can help determine your BMI and help you achieve or maintain ahealthy weight. Get regular exercise Get regular exercise. This is one of the most important things you can do for your health. Most adults should: Exercise for at least 150 minutes each week. The exercise should increase your heart rate and make you sweat (moderate-intensity exercise). Do strengthening exercises at least twice a week. This is in addition to the moderate-intensity exercise. Spend less time sitting. Even light physical activity can be beneficial. Watch cholesterol and blood lipids Have your blood tested for lipids and cholesterol at 63 years of age, then havethis test every 5 years. You may need to have your cholesterol levels checked more often if: Your lipid or cholesterol levels are high. You are older than 63 years of age. You are at high risk for heart disease. What should I know about cancer screening? Many types of cancers can be detected early and may often be prevented. Depending on your health history and family history, you may need to have cancer screening at various ages. This may include screening for: Colorectal cancer. Prostate cancer. Skin cancer. Lung  cancer. What should I know about heart disease, diabetes, and high blood pressure? Blood pressure and heart disease High blood pressure causes heart disease and increases the risk of stroke. This is more likely to develop in people who have high blood pressure readings, are of African descent, or are overweight. Talk with your health care provider about your target blood pressure readings. Have your blood pressure checked: Every 3-5 years if you are 18-39 years of age. Every year if you are 40 years old or older. If you are between the ages of 65 and 75 and are a current or former smoker, ask your health care provider if you should have a one-time screening for abdominal aortic aneurysm (AAA). Diabetes Have regular diabetes screenings. This checks your fasting blood sugar level. Have the screening done: Once every three years after age 45 if you are at a normal weight and have a low risk for diabetes. More often and at a younger age if you are overweight or have a high risk for diabetes. What should I know about preventing infection? Hepatitis B If you have a higher risk for hepatitis B, you should be screened for this virus. Talk with your health care provider to find out if you are at risk forhepatitis B infection. Hepatitis C Blood testing is recommended for: Everyone born from 1945 through 1965. Anyone with known risk factors for hepatitis C. Sexually transmitted infections (STIs) You should be screened each year for STIs, including gonorrhea and chlamydia, if: You are sexually active and are younger than 63 years of age. You are older than 63 years of age   and your health care provider tells you that you are at risk for this type of infection. Your sexual activity has changed since you were last screened, and you are at increased risk for chlamydia or gonorrhea. Ask your health care provider if you are at risk. Ask your health care provider about whether you are at high risk for HIV.  Your health care provider may recommend a prescription medicine to help prevent HIV infection. If you choose to take medicine to prevent HIV, you should first get tested for HIV. You should then be tested every 3 months for as long as you are taking the medicine. Follow these instructions at home: Lifestyle Do not use any products that contain nicotine or tobacco, such as cigarettes, e-cigarettes, and chewing tobacco. If you need help quitting, ask your health care provider. Do not use street drugs. Do not share needles. Ask your health care provider for help if you need support or information about quitting drugs. Alcohol use Do not drink alcohol if your health care provider tells you not to drink. If you drink alcohol: Limit how much you have to 0-2 drinks a day. Be aware of how much alcohol is in your drink. In the [place] one drink equals one 12 oz bottle of beer (355 mL), one 5 oz glass of wine (148 mL), or one 1 oz glass of hard liquor (44 mL). General instructions Schedule regular health, dental, and eye exams. Stay current with your vaccines. Tell your health care provider if: You often feel depressed. You have ever been abused or do not feel safe at home. Summary Adopting a healthy lifestyle and getting preventive care are important in promoting health and wellness. Follow your health care provider's instructions about healthy diet, exercising, and getting tested or screened for diseases. Follow your health care provider's instructions on monitoring your cholesterol and blood pressure. This information is not intended to replace advice given to you by your health care provider. Make sure you discuss any questions you have with your healthcare provider. Document Revised: 08/29/2018 Document Reviewed: 08/29/2018 [employer] Patient Education  2022 [employer]  

## 2021-04-06 NOTE — Progress Notes (Signed)
covid  Subjective:  Patient ID: [name], male    DOB: 1956-04-25  Age: 65 y.o. MRN: [id number]  CC: Annual Exam, Hyperlipidemia, Hypertension, Diabetes, Anemia, Osteoarthritis, and Back Pain  This visit occurred during the SARS-CoV-2 public health emergency.  Safety protocols were in place, including screening questions prior to the visit, additional usage of staff PPE, and extensive cleaning of exam room while observing appropriate contact time as indicated for disinfecting solutions.    HPI [name] presents for a CPX and f/up -   He complains of a 3-week history of cough that is productive of clear phlegm.  He was recently seen in the ED for abdominal pain and a CT scan revealed gallstones.  He continues to have mild discomfort in his epigastric and right upper quadrant region.  He denies nausea, vomiting, diarrhea, fever, or chills.  Outpatient Medications Prior to Visit  Medication Sig Dispense Refill   Accu-Chek Softclix Lancets lancets CHECK BLOOD SUGAR 2 TIMES  DAILY 300 each 3   Albuterol Sulfate (PROAIR RESPICLICK) 627 (90 Base) MCG/ACT AEPB Inhale 1 puff into the lungs 4 (four) times daily as needed. (Patient taking differently: Inhale 1 puff into the lungs 4 (four) times daily as needed (wheezing/shortness of breath.).) 3 each 1   aspirin EC 81 MG tablet Take 1 tablet (81 mg total) by mouth daily. 90 tablet 1   atorvastatin (LIPITOR) 40 MG tablet TAKE 1 TABLET BY MOUTH DAILY. (Patient taking differently: Take 40 mg by mouth daily.) 90 tablet 1   bisoprolol (ZEBETA) 10 MG tablet TAKE 1 TABLET BY MOUTH EVERY EVENING. (Patient taking differently: Take 10 mg by mouth every evening.) 90 tablet 1   blood glucose meter kit and supplies KIT Use to check blood sugar three times daily. Dispense Freestyle Libre according to insurance preference. DX: E11.8 2 each 0   Blood Glucose Monitoring Suppl (ACCU-CHEK AVIVA CONNECT) w/Device KIT 1 Act by Does not apply route 3 (three) times  daily as needed. 1 kit 3   Blood Glucose Monitoring Suppl (ACCU-CHEK GUIDE) w/Device KIT      Cholecalciferol 50 MCG (2000 UT) TABS Take 1 tablet (2,000 Units total) by mouth daily. (Patient taking differently: Take 2,000 Units by mouth daily.) 90 tablet 1   ciclopirox (PENLAC) 8 % solution Apply topically at bedtime. Apply over nail and surrounding skin. Apply daily over previous coat. After seven (7) days, may remove with alcohol and continue cycle. 6.6 mL 0   cyanocobalamin 2000 MCG tablet Take 1 tablet (2,000 mcg total) by mouth daily. 90 tablet 1   cycloSPORINE (RESTASIS) 0.05 % ophthalmic emulsion Place 1 drop into both eyes 2 (two) times daily. 22 each 1   docusate sodium (COLACE) 100 MG capsule Take 100 mg by mouth 2 (two) times daily.     furosemide (LASIX) 80 MG tablet Take 1 tablet (80 mg total) by mouth 2 (two) times daily. Take extra 0.5 tab as needed (Patient taking differently: Take 80 mg by mouth 2 (two) times daily.) 180 tablet 1   Glucagon (GVOKE HYPOPEN 2-PACK) 1 MG/0.2ML SOAJ Inject 1 Act into the skin daily as needed. (Patient taking differently: Inject 1 Act into the skin daily as needed (low blood sugar).) 2 mL 5   glucose blood (ACCU-CHEK AVIVA PLUS) test strip Use to test blood sugar twice daily. DX E11.8 200 strip 3   HYDROcodone-acetaminophen (NORCO) 10-325 MG tablet Take 1 tablet by mouth every 6 (six) hours as needed. (Patient taking differently: Take  1 tablet by mouth every 6 (six) hours as needed for moderate pain.) 60 tablet 0   insulin detemir (LEVEMIR FLEXTOUCH) 100 UNIT/ML FlexPen Inject 240 Units into the skin daily. 75 mL 5   Insulin Pen Needle (ULTICARE SHORT PEN NEEDLES) 31G X 8 MM MISC USE WITH INSULIN PENS DAILY. 100 each 3   levothyroxine (SYNTHROID) 50 MCG tablet TAKE 1 TABLET BY MOUTH DAILY BEFORE BREAKFAST. (Patient taking differently: Take 50 mcg by mouth daily before breakfast.) 90 tablet 0   losartan (COZAAR) 50 MG tablet Take 1 tablet (50 mg total) by  mouth daily. 90 tablet 1   Multiple Vitamins-Minerals (MULTIVITAMIN ADULT PO) Take 1 tablet by mouth daily.     omeprazole (PRILOSEC) 40 MG capsule Take 1 tablet by mouth 30 min before breakfast. (Patient taking differently: Take 40 mg by mouth daily.) 90 capsule 3   potassium chloride SA (KLOR-CON) 20 MEQ tablet Take 1 tablet (20 mEq total) by mouth 2 (two) times daily. 180 tablet 1   SYNJARDY 12.01-999 MG TABS TAKE 1 TABLET BY MOUTH  TWICE DAILY (Patient taking differently: Take 1 tablet by mouth daily.) 180 tablet 1   thiamine (VITAMIN B-1) 100 MG tablet Take 1 tablet (100 mg total) by mouth daily. 90 tablet 1   ferrous sulfate (FEROSUL) 325 (65 FE) MG tablet Take 1 tablet (325 mg total) by mouth 2 (two) times daily with a meal. 180 tablet 1   No facility-administered medications prior to visit.    ROS Review of Systems  Constitutional:  Negative for chills, diaphoresis, fatigue and fever.  HENT: Negative.    Eyes: Negative.   Respiratory:  Positive for cough. Negative for chest tightness, shortness of breath and wheezing.   Cardiovascular:  Negative for chest pain, palpitations and leg swelling.  Gastrointestinal:  Positive for abdominal pain and constipation. Negative for abdominal distention, blood in stool, diarrhea, nausea and vomiting.  Endocrine: Negative.   Genitourinary: Negative.  Negative for decreased urine volume, difficulty urinating, dysuria and urgency.  Musculoskeletal:  Positive for arthralgias and back pain. Negative for myalgias.  Skin: Negative.   Neurological: Negative.  Negative for dizziness, weakness and light-headedness.  Hematological:  Negative for adenopathy. Does not bruise/bleed easily.  Psychiatric/Behavioral: Negative.     Objective:  BP 126/84 (BP Location: Left Arm, Patient Position: Sitting, Cuff Size: Large)   Pulse 69   Temp 97.9 F (36.6 C) (Oral)   Ht 6' 1" (1.854 m)   SpO2 92%   BMI 58.71 kg/m   BP Readings from Last 3 Encounters:   04/06/21 126/84  03/30/21 120/65  02/03/21 110/60    Wt Readings from Last 3 Encounters:  12/08/20 (!) 445 lb (201.9 kg)  11/24/20 (!) 455 lb 9.6 oz (206.7 kg)  11/06/20 (!) 450 lb (204.1 kg)    Physical Exam Vitals reviewed.  Constitutional:      General: He is not in acute distress.    Appearance: He is obese. He is ill-appearing (in a motorized wheelchair). He is not toxic-appearing or diaphoretic.  HENT:     Mouth/Throat:     Mouth: Mucous membranes are moist.  Eyes:     Conjunctiva/sclera: Conjunctivae normal.  Cardiovascular:     Rate and Rhythm: Normal rate and regular rhythm.     Heart sounds: No murmur heard. Pulmonary:     Effort: Pulmonary effort is normal.     Breath sounds: Examination of the right-upper field reveals decreased breath sounds. Examination of the left-upper field  reveals decreased breath sounds. Examination of the right-middle field reveals decreased breath sounds. Examination of the left-middle field reveals decreased breath sounds. Examination of the right-lower field reveals decreased breath sounds. Examination of the left-lower field reveals decreased breath sounds. Decreased breath sounds present. No wheezing, rhonchi or rales.  Abdominal:     General: Abdomen is protuberant. Bowel sounds are normal. There is no distension.     Palpations: Abdomen is soft. There is no hepatomegaly, splenomegaly or mass.     Tenderness: There is abdominal tenderness in the right upper quadrant and epigastric area. There is no guarding or rebound.     Hernia: A hernia is present. Hernia is present in the ventral area.  Musculoskeletal:        General: Normal range of motion.     Cervical back: Neck supple.     Right lower leg: No edema.     Left lower leg: No edema.  Lymphadenopathy:     Cervical: No cervical adenopathy.  Skin:    General: Skin is warm and dry.  Neurological:     General: No focal deficit present.     Mental Status: He is alert.   Psychiatric:        Mood and Affect: Mood normal.        Behavior: Behavior normal.    Lab Results  Component Value Date   WBC 8.9 03/29/2021   HGB 12.0 (L) 03/29/2021   HCT 40.1 03/29/2021   PLT 225 03/29/2021   GLUCOSE 151 (H) 03/29/2021   CHOL 136 04/06/2021   TRIG 124.0 04/06/2021   HDL 33.40 (L) 04/06/2021   LDLDIRECT 136.8 12/12/2006   LDLCALC 78 04/06/2021   ALT 17 03/29/2021   AST 22 03/29/2021   NA 139 03/29/2021   K 4.2 03/29/2021   CL 102 03/29/2021   CREATININE 2.06 (H) 03/29/2021   BUN 36 (H) 03/29/2021   CO2 28 03/29/2021   TSH 3.93 04/06/2021   PSA 1.85 04/06/2021   HGBA1C 9.7 (H) 04/06/2021   MICROALBUR <0.7 12/08/2020    CT ABDOMEN PELVIS WO CONTRAST  Result Date: 03/30/2021 CLINICAL DATA:  65 year old male with history of nausea and vomiting. EXAM: CT ABDOMEN AND PELVIS WITHOUT CONTRAST TECHNIQUE: Multidetector CT imaging of the abdomen and pelvis was performed following the standard protocol without IV contrast. COMPARISON:  CT the abdomen and pelvis 02/14/2020. FINDINGS: Lower chest: Areas of cylindrical bronchiectasis with thickening of the peribronchovascular interstitium and mild peribronchovascular ground-glass attenuation are noted in the lung bases bilaterally, most pronounced in the left lower lobe. These findings are chronic but appear slightly increased compared to the prior examination. Hepatobiliary: No definite suspicious cystic or solid hepatic lesions are confidently identified on today's noncontrast CT examination. Large faintly peripherally calcified gallstone measuring 3.8 cm in diameter lying dependently in the gallbladder. Gallbladder is moderately distended, without surrounding inflammatory changes to suggest an acute cholecystitis at this time. Pancreas: No pancreatic mass. No pancreatic ductal dilatation. No pancreatic or peripancreatic fluid collections or inflammatory changes. Spleen: Unremarkable. Adrenals/Urinary Tract: No  calcifications are identified within the collecting system of either kidney, along the course of either ureter, or within the lumen of the urinary bladder. No hydroureteronephrosis. Unenhanced appearance of the kidneys and bilateral adrenal glands is normal. Urinary bladder is normal in appearance. Stomach/Bowel: Lap band in position around the proximal stomach. Unenhanced appearance of the stomach is otherwise normal. No pathologic dilatation of small bowel or colon. A few scattered colonic diverticulae are noted, without  surrounding inflammatory changes to suggest an acute diverticulitis at this time. Short segment of mid transverse colon extends into a epigastric ventral hernia. Normal appendix. Vascular/Lymphatic: Aortic atherosclerosis. No lymphadenopathy noted in the abdomen or pelvis. Reproductive: Prostate gland and seminal vesicles are unremarkable in appearance. Other: Epigastric ventral hernia containing a portion of the mid transverse colon. No significant volume of ascites. No pneumoperitoneum. Musculoskeletal: Infusion port for the patient's lap band in the subcutaneous fat near the midline. There are no aggressive appearing lytic or blastic lesions noted in the visualized portions of the skeleton. IMPRESSION: 1. No acute findings are noted in the abdomen or pelvis to account for the patient's symptoms. 2. There is an epigastric ventral hernia containing a portion of the mid transverse colon, similar to the prior study from 2021. No evidence of associated bowel incarceration or obstruction at this time. 3. Cholelithiasis without evidence of acute cholecystitis. 4. Aortic atherosclerosis. 5. Areas of cylindrical bronchiectasis with thickening of the peribronchovascular interstitium and peribronchovascular ground-glass attenuation in the lung bases bilaterally, most pronounced in the left lower lobe. These findings are similar to but progressive when compared to remote prior study, likely reflective of a  chronic indolent atypical infectious process such as MAI (mycobacterium avium intracellulare). Nonemergent outpatient referral to Pulmonology for further clinical evaluation is suggested. 6. Additional incidental findings, as above. Electronically Signed   By: Vinnie Langton M.D.   On: 03/30/2021 07:11   DG Chest 2 View  Result Date: 03/30/2021 CLINICAL DATA:  Chest pain EXAM: CHEST - 2 VIEW COMPARISON:  11/26/2015 FINDINGS: The heart size and mediastinal contours are within normal limits. Both lungs are clear. The visualized skeletal structures are unremarkable. IMPRESSION: No active cardiopulmonary disease. Electronically Signed   By: Inez Catalina M.D.   On: 03/30/2021 00:05   VAS [place] LOWER EXTREMITY VENOUS (DVT) ([facility] and [facility] 7a-7p)  Result Date: 03/30/2021  Lower Venous DVT Study Patient Name:  [name]  Date of Exam:   03/30/2021 Medical Rec #: [id number]      Accession #:    [id number] Date of Birth: Apr 20, 1956      Patient Gender: M Patient Age:   65Y Exam Location:  [facility] Procedure:      VAS [place] LOWER EXTREMITY VENOUS (DVT) Referring Phys: [place] KEVIN STEINL --------------------------------------------------------------------------------  Indications: Swelling, and Edema.  Limitations: Body habitus and poor ultrasound/tissue interface. Comparison Study: 05/29/20 prior Performing Technologist: Archie Patten RVS  Examination Guidelines: A complete evaluation includes B-mode imaging, spectral Doppler, color Doppler, and power Doppler as needed of all accessible portions of each vessel. Bilateral testing is considered an integral part of a complete examination. Limited examinations for reoccurring indications may be performed as noted. The reflux portion of the exam is performed with the patient in reverse Trendelenburg.  +-----+---------------+---------+-----------+----------+--------------+ RIGHTCompressibilityPhasicitySpontaneityPropertiesThrombus Aging  +-----+---------------+---------+-----------+----------+--------------+ CFV  Full           Yes      Yes                                 +-----+---------------+---------+-----------+----------+--------------+   +---------+---------------+---------+-----------+----------+-----------------+ LEFT     CompressibilityPhasicitySpontaneityPropertiesThrombus Aging    +---------+---------------+---------+-----------+----------+-----------------+ CFV      Full           Yes      Yes                                    +---------+---------------+---------+-----------+----------+-----------------+  SFJ      Full                                                           +---------+---------------+---------+-----------+----------+-----------------+ FV Prox  Full                                                           +---------+---------------+---------+-----------+----------+-----------------+ FV Mid   Full                                                           +---------+---------------+---------+-----------+----------+-----------------+ FV DistalFull                                                           +---------+---------------+---------+-----------+----------+-----------------+ PFV      Full                                                           +---------+---------------+---------+-----------+----------+-----------------+ POP      Full           Yes      Yes                                    +---------+---------------+---------+-----------+----------+-----------------+ PTV      Full                                                           +---------+---------------+---------+-----------+----------+-----------------+ PERO                                                  Age Indeterminate +---------+---------------+---------+-----------+----------+-----------------+    Summary: RIGHT: - No evidence of common femoral vein obstruction.   LEFT: - There is no evidence of deep vein thrombosis in the lower extremity.  - No cystic structure found in the popliteal fossa.  *See table(s) above for measurements and observations. Electronically signed by Ruta Hinds MD on 03/30/2021 at 5:07:21 PM.    Final     Assessment & Plan:   [name] was seen today for annual exam, hyperlipidemia, hypertension, diabetes, anemia, osteoarthritis and back pain.  Diagnoses and all orders for this visit:  Essential hypertension- His blood pressure is  adequately well controlled. -     TSH; Future -     TSH  Malignant neoplasm of colon, unspecified part of colon ([place])- Will check a CEA to screen for recurrence. -     CEA; Future -     CEA  Uncontrolled type 2 diabetes mellitus with hyperglycemia ([place])- I have asked him to be more compliant with his glycemic agents. -     Hemoglobin A1c; Future -     Vitamin B12; Future -     Vitamin B12 -     Hemoglobin A1c -     Consult to [place] Management -     Amb Referral to Nutrition and Diabetic Education -     Ambulatory referral to Endocrinology  Diabetes mellitus due to underlying condition with both eyes affected by proliferative retinopathy without macular edema, with long-term current use of insulin ([name])- See above. -     Hemoglobin A1c; Future -     Hemoglobin A1c  Acquired hypothyroidism- His TSH is in the normal range.  He will stay on the current dose of levothyroxine. -     TSH; Future -     TSH  Benign prostatic hyperplasia without lower urinary tract symptoms- His PSA is low.  A reassuring sign that he does not have prostate cancer. -     PSA; Future -     PSA  Bariatric surgery status- Will screen for vitamin deficiencies. -     Iron; Future -     Vitamin B1; Future -     Ferritin; Future -     Zinc; Future -     VITAMIN D 25 Hydroxy (Vit-D Deficiency, Fractures); Future -     VITAMIN D 25 Hydroxy (Vit-D Deficiency, Fractures) -     Zinc -     Ferritin -     Vitamin B1 -      Iron  B12 deficiency- Will monitor his folate level. -     Folate; Future -     Folate  Other iron deficiency anemia- He remains mildly anemic and his iron level is low.  I recommended that he restart an iron supplement. -     Iron; Future -     Iron -     Ferric Maltol (ACCRUFER) 30 MG CAPS; Take 1 capsule by mouth in the morning and at bedtime.  Hyperlipidemia with target LDL less than 100- LDL goal achieved. Doing well on the statin  -     Lipid panel; Future -     Lipid panel  Encounter for general adult medical examination with abnormal findings- Exam completed, labs reviewed, vaccines reviewed and updated, cancer screenings are UTD, patient education was given.  Vitamin D deficiency -     VITAMIN D 25 Hydroxy (Vit-D Deficiency, Fractures); Future -     VITAMIN D 25 Hydroxy (Vit-D Deficiency, Fractures)  Thiamine deficiency -     Vitamin B1; Future -     Vitamin B1  Need for shingles vaccine -     Zoster Vaccine Adjuvanted [facility]) injection; Inject 0.5 mLs into the muscle once for 1 dose.  Cough productive of clear sputum- His COVID IgG antibody is quite high.  I think this explains his cough.  He has presented too late for treatment and his symptoms are mild.   -     SARS-COV-2 IgG; Future -     SARS-COV-2 IgG  Calculus of gallbladder without cholecystitis without obstruction -  Ambulatory referral to General Surgery  Bronchitis due to COVID-19 virus- See above.  I have discontinued [name]'s ferrous sulfate. I am also having him start on Shingrix and ACCRUFeR. Additionally, I am having him maintain his omeprazole, Multiple Vitamins-Minerals (MULTIVITAMIN ADULT PO), Restasis, thiamine, UltiCare Short Pen Needles, cyanocobalamin, Cholecalciferol, aspirin EC, docusate sodium, Gvoke HypoPen 2-Pack, potassium chloride SA, losartan, Accu-Chek Aviva Plus, blood glucose meter kit and supplies, Accu-Chek Softclix Lancets, Accu-Chek Aviva Connect, ciclopirox, Accu-Chek  Guide, furosemide, Levemir FlexTouch, bisoprolol, Synjardy, ProAir RespiClick, HYDROcodone-acetaminophen, atorvastatin, and levothyroxine.  Meds ordered this encounter  Medications   Zoster Vaccine Adjuvanted [facility] injection    Sig: Inject 0.5 mLs into the muscle once for 1 dose.    Dispense:  0.5 mL    Refill:  1   Ferric Maltol (ACCRUFER) 30 MG CAPS    Sig: Take 1 capsule by mouth in the morning and at bedtime.    Dispense:  180 capsule    Refill:  1   In addition to time spent on CPE, I spent 45 minutes in preparing to see the patient by review of recent labs, and imaging, obtaining and reviewing separately obtained history, communicating with the patient, ordering medications and documenting clinical information in the EHR including the differential Dx, treatment, and any further evaluation and other management of 1. Essential hypertension 2. Malignant neoplasm of colon, unspecified part of colon ([place]) 3. Uncontrolled type 2 diabetes mellitus with hyperglycemia ([place]) 4. Diabetes mellitus due to underlying condition with both eyes affected by proliferative retinopathy without macular edema, with long-term current use of insulin ([facility]) 5. Acquired hypothyroidism 6. Benign prostatic hyperplasia without lower urinary tract symptoms 7. Bariatric surgery status 8. B12 deficiency 9. Other iron deficiency anemia 10. Hyperlipidemia with target LDL less than 100 11. Vitamin D deficiency 12. Thiamine deficiency 13. Cough productive of clear sputum 14. Calculus of gallbladder without cholecystitis without obstruction 15. Bronchitis due to COVID-19 virus       Follow-up: Return in about 3 months (around 07/07/2021).  Scarlette Calico, MD

## 2021-04-09 ENCOUNTER — Other Ambulatory Visit: Payer: Self-pay | Admitting: *Deleted

## 2021-04-09 ENCOUNTER — Encounter: Payer: Self-pay | Admitting: Internal Medicine

## 2021-04-09 LAB — ZINC: Zinc: 60 ug/dL (ref 60–130)

## 2021-04-09 LAB — CEA: CEA: <0.5 ng/mL

## 2021-04-09 LAB — VITAMIN B1: Vitamin B1 (Thiamine): 80 nmol/L — ABNORMAL HIGH (ref 8–30)

## 2021-04-09 NOTE — Patient Outreach (Addendum)
[facility]) Care Management  04/09/2021  [name] [birth year] [id number]   RN Health Coach telephone call to patient.  [employer] compliance verified. RN explained following up to see how he is doing. Per patient he is on his way out. RN inquired if he is having some depression. Per patient he is not having any depression. He stated that he has COVID and gall stones. Per patient he tested positive on the 18 th. He said the Dr nurse called in a prescription and said it would be there Wed or Thursday. He stated he is to get the shingles vaccine on Mon. RN asked patient if he had followed up with the Dr office regarding having not received his medication  yet and receiving the Shingles vaccine on Monday, if need to reschedule since he is positive for COVID. Patient stated he is tired of answering questions and was hanging up.  Plan: RN will follow up again with patient within 30 days   [place] [facility] [phone]

## 2021-04-13 ENCOUNTER — Encounter (HOSPITAL_COMMUNITY): Payer: Medicare Other

## 2021-04-13 ENCOUNTER — Telehealth: Payer: Self-pay | Admitting: Internal Medicine

## 2021-04-13 NOTE — Telephone Encounter (Signed)
Call patient to discuss covid results Patient states he needed more information on covid diagnosis and issue with getting shingles vaccine  Please call

## 2021-04-14 NOTE — Telephone Encounter (Signed)
Called pt, LVM.   

## 2021-04-15 ENCOUNTER — Telehealth: Payer: Self-pay | Admitting: Podiatry

## 2021-04-15 NOTE — Telephone Encounter (Signed)
Diabetic shoes/inserts in... called pts number and it is not a valid number..  I then called pts emergency contact Marylou Mccoy and left message for him to have pt contact me to schedule an appt to pick up his diabetic shoes.

## 2021-04-19 ENCOUNTER — Other Ambulatory Visit: Payer: Self-pay | Admitting: Internal Medicine

## 2021-04-19 ENCOUNTER — Telehealth: Payer: Self-pay

## 2021-04-19 DIAGNOSIS — E1142 Type 2 diabetes mellitus with diabetic polyneuropathy: Secondary | ICD-10-CM

## 2021-04-19 DIAGNOSIS — I5032 Chronic diastolic (congestive) heart failure: Secondary | ICD-10-CM

## 2021-04-19 DIAGNOSIS — M8949 Other hypertrophic osteoarthropathy, multiple sites: Secondary | ICD-10-CM

## 2021-04-19 DIAGNOSIS — M159 Polyosteoarthritis, unspecified: Secondary | ICD-10-CM

## 2021-04-19 DIAGNOSIS — E118 Type 2 diabetes mellitus with unspecified complications: Secondary | ICD-10-CM

## 2021-04-19 DIAGNOSIS — I10 Essential (primary) hypertension: Secondary | ICD-10-CM

## 2021-04-19 DIAGNOSIS — M5416 Radiculopathy, lumbar region: Secondary | ICD-10-CM

## 2021-04-19 MED ORDER — HYDROCODONE-ACETAMINOPHEN 10-325 MG PO TABS: 1.0000 | ORAL_TABLET | Freq: Four times a day (QID) | ORAL | 0 refills | Status: DC | PRN

## 2021-04-19 MED ORDER — LEVEMIR FLEXTOUCH 100 UNIT/ML ~~LOC~~ SOPN: 240.0000 [IU] | PEN_INJECTOR | Freq: Every day | SUBCUTANEOUS | 5 refills | Status: DC

## 2021-04-19 MED ORDER — BISOPROLOL FUMARATE 10 MG PO TABS: 10.0000 mg | ORAL_TABLET | Freq: Every evening | ORAL | 1 refills | Status: AC

## 2021-04-19 NOTE — Telephone Encounter (Signed)
pt has stated he is in need of medication refills for HYDROcodone-acetaminophen (NORCO) 10-325 MG tablet, Bisoprolol 10MG  and his Levemir.  **Please advise as the pt has stated he is in need of some sleeping medication due to the pt not being able to sleep.

## 2021-04-21 ENCOUNTER — Other Ambulatory Visit: Payer: Self-pay | Admitting: Internal Medicine

## 2021-04-21 ENCOUNTER — Telehealth: Payer: Self-pay | Admitting: Internal Medicine

## 2021-04-21 DIAGNOSIS — D500 Iron deficiency anemia secondary to blood loss (chronic): Secondary | ICD-10-CM

## 2021-04-21 DIAGNOSIS — D508 Other iron deficiency anemias: Secondary | ICD-10-CM

## 2021-04-21 DIAGNOSIS — D509 Iron deficiency anemia, unspecified: Secondary | ICD-10-CM

## 2021-04-21 NOTE — Telephone Encounter (Signed)
Called pt, LVM needing fax number to send the order.

## 2021-04-21 NOTE — Telephone Encounter (Signed)
Patient called in about getting a order submitted to [facility] for a walker  Please callback [phone]

## 2021-04-26 ENCOUNTER — Encounter: Payer: Self-pay | Admitting: Endocrinology

## 2021-04-27 DIAGNOSIS — M1990 Unspecified osteoarthritis, unspecified site: Secondary | ICD-10-CM | POA: Diagnosis not present

## 2021-04-27 NOTE — Telephone Encounter (Signed)
Order has been faxed to fax number provided below

## 2021-04-27 NOTE — Telephone Encounter (Signed)
Patient called in to provide fax # for [facility]..  [phone]

## 2021-05-05 ENCOUNTER — Ambulatory Visit: Payer: Medicare Other | Admitting: Nutrition

## 2021-05-07 ENCOUNTER — Other Ambulatory Visit: Payer: Self-pay | Admitting: Internal Medicine

## 2021-05-12 ENCOUNTER — Ambulatory Visit (INDEPENDENT_AMBULATORY_CARE_PROVIDER_SITE_OTHER): Payer: Medicare Other | Admitting: Podiatrist

## 2021-05-12 ENCOUNTER — Encounter: Payer: Self-pay | Admitting: Podiatrist

## 2021-05-12 ENCOUNTER — Encounter: Payer: Medicare Other | Attending: Internal Medicine | Admitting: Nutrition

## 2021-05-12 ENCOUNTER — Other Ambulatory Visit: Payer: Self-pay

## 2021-05-12 DIAGNOSIS — M2041 Other hammer toe(s) (acquired), right foot: Secondary | ICD-10-CM | POA: Diagnosis not present

## 2021-05-12 DIAGNOSIS — M2011 Hallux valgus (acquired), right foot: Secondary | ICD-10-CM | POA: Diagnosis not present

## 2021-05-12 DIAGNOSIS — L84 Corns and callosities: Secondary | ICD-10-CM | POA: Diagnosis not present

## 2021-05-12 DIAGNOSIS — E1142 Type 2 diabetes mellitus with diabetic polyneuropathy: Secondary | ICD-10-CM

## 2021-05-12 DIAGNOSIS — B351 Tinea unguium: Secondary | ICD-10-CM | POA: Diagnosis not present

## 2021-05-12 DIAGNOSIS — L603 Nail dystrophy: Secondary | ICD-10-CM

## 2021-05-12 DIAGNOSIS — M2042 Other hammer toe(s) (acquired), left foot: Secondary | ICD-10-CM | POA: Diagnosis not present

## 2021-05-12 DIAGNOSIS — L608 Other nail disorders: Secondary | ICD-10-CM | POA: Diagnosis not present

## 2021-05-12 DIAGNOSIS — M2012 Hallux valgus (acquired), left foot: Secondary | ICD-10-CM | POA: Diagnosis not present

## 2021-05-12 NOTE — Progress Notes (Signed)
Chief Complaint  Patient presents with   Foot Pain    The left side is dry and cracking and peeling on left foot and there is a sore on the right big toe    Nail Problem    I have a toenail on the left big toe that is thick and discolored and I have tried all the pills and nothing works     HPI: Patient is 65 y.o. male who presents today for several issues.  #1 he states that the toenail in the left great toe is thickened discolored.  He has tried taking the pills for the nail and states that it does not work.  #2 he has a area of cracking and peeling skin on the right foot lateral side #3 he has a callus on the left foot first metatarsal head and #4 his diabetic shoes are in and need to be dispensed.  Patient presents today in a electric power chair and overall states he is doing well.  Denies any nausea vomiting fevers chills night sweats or systemic signs of infection.  Allergies  Allergen Reactions   Shellfish Allergy Anaphylaxis   Lisinopril Cough   Lovastatin Itching    Review of systems is reviewed and negative.   Physical Exam  Patient is awake, alert, and oriented x 3.  In no acute distress.     Vascular Examination: Capillary refill time immediate b/l.Dorsalis pedis pulses palpable b/l.Posterior tibial pulses palpable b/l. Digital hair absent b/l.Skin temperature gradient WNL b/l.   Dermatological Examination: Skin with normal turgor, texture and tone b/l. Left hallux nail is thickened and has an area of darkened discoloration on the lateral half of the nailbed.  It appears to be either fungal or old dried hemorrhagic tissue-  took a sample and sent to [facility] for identification.   Toenails 1-5 b/l discolored, thick, dystrophic with subungual debris and pain with palpation to nailbeds due to thickness of nails.   Hyperkeratotic lesion(s) sub hallux left foot, submet head 1 left foot.  Right foot lateral border has an area of thickened tissue resulting from what appears to have  been a resolved blister. Marland Kitchen No erythema, no edema, no drainage, no flocculence noted.   Musculoskeletal: Muscle strength 5/5 to all LE muscle groups.Hallux hammertoe right foot. HAV with bunion deformity b/l.  Utilized motorized chair for mobility assistance.   Neurological: Sensation diminished with 10 gram monofilament bilaterally.  Vibratory sensation diminished bilaterally.   Assessment: 1. Dermatophytosis of nail   2. Onychodystrophy   3. Diabetic peripheral neuropathy associated with type 2 diabetes mellitus ([place])   4. Corns and callosities   5. Diabetic peripheral neuropathy [facility])      Plan: Exam findings were discussed with the patient.   #1 I recommended taking a culture of the left great toenail and this was accomplished today by taking a sample of the left great toenail and sending to [facility] pathology notification.  I will call with the result of the culture.  I did discuss that because he has failed Lamisil in the past it is likely it will be unsuccessful in treating his nails.  Recommended a topical compounded medication and will call in the nail treatment from [facility] for him.. #2 toenails debrided in length and thickness without complication at today's visit. #3 calluses pared down with a #15 blade without complication as well.  Discussed that the area on the right foot appears to have been a blister that popped and has subsequently healed  and recommended he watch this area carefully in the future. #4 diabetic shoes and inserts were dispensed.  He was given instructions for use and wear as well as instructions for the use of the inserts.  He states that they are comfortable and free of defect and he would like to wear them out today. #5 recommended routine diabetic foot care appointments every 3 months.  He will be set up to be seen back in 3 months.  If any concerns arise prior to that visit he is instructed to call.

## 2021-05-18 ENCOUNTER — Telehealth: Payer: Self-pay | Admitting: Internal Medicine

## 2021-05-18 NOTE — Telephone Encounter (Signed)
Patient wants to know if there is anyway he can be referred to a GI provider that can get him scheduled sooner  Current appt is not till November & patient says he can not wait that long  Please follow up & advise [phone]

## 2021-05-25 ENCOUNTER — Other Ambulatory Visit (HOSPITAL_COMMUNITY): Payer: Self-pay | Admitting: Cardiology

## 2021-05-25 ENCOUNTER — Other Ambulatory Visit: Payer: Self-pay | Admitting: Internal Medicine

## 2021-05-25 DIAGNOSIS — E1142 Type 2 diabetes mellitus with diabetic polyneuropathy: Secondary | ICD-10-CM

## 2021-05-25 DIAGNOSIS — M8949 Other hypertrophic osteoarthropathy, multiple sites: Secondary | ICD-10-CM

## 2021-05-25 DIAGNOSIS — M159 Polyosteoarthritis, unspecified: Secondary | ICD-10-CM

## 2021-05-25 DIAGNOSIS — E039 Hypothyroidism, unspecified: Secondary | ICD-10-CM

## 2021-05-25 DIAGNOSIS — M5416 Radiculopathy, lumbar region: Secondary | ICD-10-CM

## 2021-05-26 ENCOUNTER — Encounter: Payer: Medicare Other | Attending: Internal Medicine | Admitting: Nutrition

## 2021-05-26 ENCOUNTER — Other Ambulatory Visit: Payer: Self-pay

## 2021-05-26 DIAGNOSIS — E118 Type 2 diabetes mellitus with unspecified complications: Secondary | ICD-10-CM | POA: Diagnosis present

## 2021-05-27 DIAGNOSIS — M159 Polyosteoarthritis, unspecified: Secondary | ICD-10-CM | POA: Diagnosis not present

## 2021-05-27 DIAGNOSIS — Z9884 Bariatric surgery status: Secondary | ICD-10-CM | POA: Diagnosis not present

## 2021-05-27 DIAGNOSIS — E785 Hyperlipidemia, unspecified: Secondary | ICD-10-CM | POA: Diagnosis not present

## 2021-05-27 DIAGNOSIS — I10 Essential (primary) hypertension: Secondary | ICD-10-CM | POA: Diagnosis not present

## 2021-05-27 DIAGNOSIS — Z79891 Long term (current) use of opiate analgesic: Secondary | ICD-10-CM | POA: Diagnosis not present

## 2021-05-27 DIAGNOSIS — E118 Type 2 diabetes mellitus with unspecified complications: Secondary | ICD-10-CM | POA: Diagnosis not present

## 2021-05-27 DIAGNOSIS — K8020 Calculus of gallbladder without cholecystitis without obstruction: Secondary | ICD-10-CM | POA: Diagnosis not present

## 2021-05-27 DIAGNOSIS — K432 Incisional hernia without obstruction or gangrene: Secondary | ICD-10-CM | POA: Diagnosis not present

## 2021-05-27 DIAGNOSIS — E1321 Other specified diabetes mellitus with diabetic nephropathy: Secondary | ICD-10-CM | POA: Diagnosis not present

## 2021-05-27 DIAGNOSIS — G4733 Obstructive sleep apnea (adult) (pediatric): Secondary | ICD-10-CM | POA: Diagnosis not present

## 2021-06-03 ENCOUNTER — Telehealth: Payer: Self-pay | Admitting: *Deleted

## 2021-06-03 NOTE — Telephone Encounter (Signed)
-----   Message from Bronson Ing, DPM sent at 06/03/2021  2:11 PM EDT ----- Regarding: culture negative for fungus This culture is also negative for fungus.  Lab recommends urea agent-  [facility] is my otc go to.  Thanks!!!   ----- Message ----- From: Milana Na Sent: 05/20/2021   1:44 PM EDT To: Bronson Ing, DPM

## 2021-06-03 NOTE — Telephone Encounter (Signed)
Called patient - L/M - informed patient that is culture done on his toenails were negative for fungus and he could try using Kerasal OTC urea topical gel to help the appearance of the nail.

## 2021-06-09 ENCOUNTER — Other Ambulatory Visit: Payer: Self-pay | Admitting: *Deleted

## 2021-06-09 NOTE — Patient Outreach (Signed)
[place] [facility]) Care Management  06/09/2021  [name] [birth year] [id number]  RN Health Coach attempted follow up outreach call to patient.  Per patient stated could he be called another time he was busy.  Plan: RN will call patient again within 30 days.  [place] Care Management [phone]

## 2021-06-10 NOTE — Progress Notes (Signed)
[name] is here via wheelchair and on O2 to discuss his diet and need for weight loss.  Patient reports that "this is a lost cause" and does not see himself as loosing a lot of weight.  Discussed possible 20 pound weight loss for help with diabetes control and breathing difficulties Diabetes medications: Levemir 240u QD 8AM-9AMs Synjardy: 12.5-1000mg  Typical Day: Awake at 8-9AM takes his Levemir and goes back to sleep 4PM: Up  eats white rice

## 2021-06-11 DIAGNOSIS — M150 Primary generalized (osteo)arthritis: Secondary | ICD-10-CM | POA: Diagnosis not present

## 2021-06-11 DIAGNOSIS — E1140 Type 2 diabetes mellitus with diabetic neuropathy, unspecified: Secondary | ICD-10-CM | POA: Diagnosis not present

## 2021-06-11 DIAGNOSIS — L98491 Non-pressure chronic ulcer of skin of other sites limited to breakdown of skin: Secondary | ICD-10-CM | POA: Diagnosis not present

## 2021-06-11 DIAGNOSIS — R609 Edema, unspecified: Secondary | ICD-10-CM | POA: Diagnosis not present

## 2021-06-14 DIAGNOSIS — H35373 Puckering of macula, bilateral: Secondary | ICD-10-CM | POA: Diagnosis not present

## 2021-06-14 DIAGNOSIS — H35361 Drusen (degenerative) of macula, right eye: Secondary | ICD-10-CM | POA: Diagnosis not present

## 2021-06-14 DIAGNOSIS — H31091 Other chorioretinal scars, right eye: Secondary | ICD-10-CM | POA: Diagnosis not present

## 2021-06-14 DIAGNOSIS — E113513 Type 2 diabetes mellitus with proliferative diabetic retinopathy with macular edema, bilateral: Secondary | ICD-10-CM | POA: Diagnosis not present

## 2021-06-15 ENCOUNTER — Telehealth: Payer: Self-pay

## 2021-06-15 NOTE — Telephone Encounter (Signed)
Type of form received ([employer])  Form placed in (Provider's Box)  Additional instructions from the patient Fax  Things to remember: [place] office: If form received in person, remind patient that forms take 7-10 business days CMA should attach charge sheet and put on Supervisor's desk

## 2021-06-16 NOTE — Telephone Encounter (Signed)
Forms have been completed and placed ov PCPs desk for review and signature.

## 2021-06-17 NOTE — Telephone Encounter (Signed)
Forms have been signed  Pt informed that papers were completed. He requested to pick them up. Forms are at the front desk for pick up.

## 2021-06-21 ENCOUNTER — Other Ambulatory Visit: Payer: Self-pay | Admitting: Internal Medicine

## 2021-06-21 DIAGNOSIS — E1142 Type 2 diabetes mellitus with diabetic polyneuropathy: Secondary | ICD-10-CM

## 2021-06-21 DIAGNOSIS — M5416 Radiculopathy, lumbar region: Secondary | ICD-10-CM

## 2021-06-21 DIAGNOSIS — M159 Polyosteoarthritis, unspecified: Secondary | ICD-10-CM

## 2021-06-22 ENCOUNTER — Telehealth: Payer: Self-pay

## 2021-06-22 MED ORDER — BLOOD GLUCOSE METER KIT: PACK | 0 refills | Status: DC

## 2021-06-22 NOTE — Telephone Encounter (Signed)
Rx has been printed and signed. Last OV note printed.   All documents have been faxed to [facility] as requested.

## 2021-06-22 NOTE — Telephone Encounter (Signed)
Please advise as Sharee Pimple with [employer] has called in regard to pt needing a rx for a new Glucometer sent in a/w a supporting clinical note.  Please fax over rx and clinical note to [phone] ATTEN: [facility]

## 2021-06-28 ENCOUNTER — Ambulatory Visit: Payer: Medicare Other | Admitting: Podiatry

## 2021-07-09 ENCOUNTER — Telehealth: Payer: Self-pay | Admitting: Internal Medicine

## 2021-07-09 NOTE — Telephone Encounter (Signed)
1.Medication Requested:HYDROcodone-acetaminophen (NORCO) 10-325 MG tablet  2. Pharmacy (Name, [place]):  [place], [address] [place] Phone:  [phone]  Fax:  [phone]     4. Last Visit with PCP: 7.19.2022  5. Next visit date with PCP: 11.8.2022  Pt. States that he is now taking 4 pills/day   Agent: Please be advised that RX refills may take up to 3 business days. We ask that you follow-up with your pharmacy.

## 2021-07-12 ENCOUNTER — Other Ambulatory Visit: Payer: Self-pay | Admitting: Internal Medicine

## 2021-07-12 DIAGNOSIS — M159 Polyosteoarthritis, unspecified: Secondary | ICD-10-CM

## 2021-07-12 DIAGNOSIS — E1142 Type 2 diabetes mellitus with diabetic polyneuropathy: Secondary | ICD-10-CM

## 2021-07-12 DIAGNOSIS — M5416 Radiculopathy, lumbar region: Secondary | ICD-10-CM

## 2021-07-13 ENCOUNTER — Other Ambulatory Visit: Payer: Self-pay | Admitting: Internal Medicine

## 2021-07-13 DIAGNOSIS — M5416 Radiculopathy, lumbar region: Secondary | ICD-10-CM

## 2021-07-13 DIAGNOSIS — E1142 Type 2 diabetes mellitus with diabetic polyneuropathy: Secondary | ICD-10-CM

## 2021-07-13 DIAGNOSIS — M159 Polyosteoarthritis, unspecified: Secondary | ICD-10-CM

## 2021-07-16 ENCOUNTER — Other Ambulatory Visit: Payer: Self-pay | Admitting: *Deleted

## 2021-07-16 NOTE — Patient Outreach (Signed)
[place] [facility]) Care Management  07/16/2021  Sebastyan Snodgrass 27-Feb-1956 [id number]   RN Health Coach attempted follow up outreach call to patient.  Patient was unavailable. HIPPA compliance voicemail message left with return callback number.  Plan: RN will call patient again within 30 days.  [place] Care Management [phone]

## 2021-07-19 ENCOUNTER — Other Ambulatory Visit: Payer: Self-pay | Admitting: Internal Medicine

## 2021-07-19 DIAGNOSIS — E118 Type 2 diabetes mellitus with unspecified complications: Secondary | ICD-10-CM

## 2021-07-19 DIAGNOSIS — E1022 Type 1 diabetes mellitus with diabetic chronic kidney disease: Secondary | ICD-10-CM

## 2021-07-21 ENCOUNTER — Ambulatory Visit: Payer: Medicare Other | Admitting: Internal Medicine

## 2021-07-22 ENCOUNTER — Telehealth (HOSPITAL_COMMUNITY): Payer: Self-pay | Admitting: Vascular Surgery

## 2021-07-22 NOTE — Telephone Encounter (Signed)
Pt left VM wanting ASAP appt, pt states he is having chest pain on the right side of chest feeling like needles.. please advise

## 2021-07-23 NOTE — Telephone Encounter (Signed)
Patient has an office visit it 2 weeks.

## 2021-07-23 NOTE — Telephone Encounter (Signed)
Late entry  Spoke to pt 07/22/21. Pt said he was having right side pain under his arm when he lifts his arm up. Pt denies nausea/vomiting, left sided chest pain, arm ain, jaw pain, back pain, shortness of breath, denies diaphoresis.  Advised more than likely no cardiac but to keep an eye on it and contact our office if symptoms worsen.

## 2021-07-27 ENCOUNTER — Telehealth: Payer: Self-pay | Admitting: Internal Medicine

## 2021-07-27 ENCOUNTER — Ambulatory Visit: Payer: Medicare Other | Admitting: Internal Medicine

## 2021-07-27 ENCOUNTER — Other Ambulatory Visit: Payer: Self-pay | Admitting: Internal Medicine

## 2021-07-27 DIAGNOSIS — E119 Type 2 diabetes mellitus without complications: Secondary | ICD-10-CM | POA: Insufficient documentation

## 2021-07-27 MED ORDER — FREESTYLE LIBRE 2 SENSOR MISC
1.0000 | Freq: Every day | 5 refills | Status: AC
Start: 2021-07-27 — End: ?

## 2021-07-27 MED ORDER — FREESTYLE LIBRE 2 READER DEVI: 1.0000 | Freq: Every day | 5 refills | Status: DC

## 2021-07-27 NOTE — Telephone Encounter (Signed)
Patient checking status of freestyle libre rx  Patient states pharmacy sent a fax and have not received a response  Patient requesting a call back

## 2021-07-29 DIAGNOSIS — H5213 Myopia, bilateral: Secondary | ICD-10-CM | POA: Diagnosis not present

## 2021-07-29 DIAGNOSIS — E113292 Type 2 diabetes mellitus with mild nonproliferative diabetic retinopathy without macular edema, left eye: Secondary | ICD-10-CM | POA: Diagnosis not present

## 2021-07-29 DIAGNOSIS — H524 Presbyopia: Secondary | ICD-10-CM | POA: Diagnosis not present

## 2021-07-29 DIAGNOSIS — H2512 Age-related nuclear cataract, left eye: Secondary | ICD-10-CM | POA: Diagnosis not present

## 2021-07-29 DIAGNOSIS — H52223 Regular astigmatism, bilateral: Secondary | ICD-10-CM | POA: Diagnosis not present

## 2021-08-06 NOTE — Progress Notes (Addendum)
ID:  [name], DOB 01-23-1956, MRN [id number]   Provider location: [place] [facility] Type of Visit: Established patient   PCP:  [name], MD  Nephrology: HF Cardiologist:  Dr. Aundra Dubin  Chief Complaint: Heart Failure    History of Present Illness: [name] is a 64 y.o. male who has history of morbid obesity, diabetes II, and diastolic CHF. S/P Lap band procedure years ago.   Last seen in 2021. No recent hospitalization.   Seen 3/22 for HF follow up. Gained a lot of weight during the pandemic.   Today he returns for HF follow up. Main complaint is tingling in fingers and right shoulder pain. He remains SOB getting into and out of his electric scooter and with ADLs. Uses a walker to get around the house. Feels legs and right hand are swollen.  Uses an electric wheelchair to get around. Denies CP, dizziness, or PND/Orthopnea. Appetite ok. No fever or chills. He is not weighing at home, scale does not accommodate his weight. Taking all medications. Lives alone and either drives or uses [employer] for transportation. Does not sleep well, unable to tolerate CPAP. Sometimes forgets to take lasix at night. Eats Chinese take out and pizza some, drinks a lot of water.   ECG (personally reviewed): SR 67 bpm  Labs (8/10): creatinine 1.1, LDL 94, HDL 34 Labs (8/11): K 4.8, creatinine 1.2, LDL 93, HDL 33 Labs (10/12): K 5.4, creatinine 1.5, LDL 95, HDL 42 Labs (8/14): LDL 115, HDL 42 Labs (9/14): BNP 68 Labs (10/14): K 4.5, creatinine 1.6 Labs (7/15): K 4.4, creatinine 1.3 Labs (8/15): LDL 136, HDL 37 Labs (11/17): K 4.6, creatinine 1.43, LDL 111, HDL 37 Labs (4/18): K 4.9, creatinine 1.35, hgb 10.5 Labs (1/19): creatinine 2.56 Labs (3/20): K 4, creatinine 1.88, hgb 10.2, HIV negative  Labs (7/20): K 4.7, creatinine 2.46 Labs (5/21): K 4, creatinine 2.04, LDL 122 Labs (10/21): Creatinine 1.8  Labs (7/22): K 4.2, creatinine 2.06    Past Medical History: 1.  Diabetes mellitus.  Has history of diabetic foot ulcer and diabetic peripheral neuropathy. .  2. Hyperlipidemia 3. Hypertension: ACEI cough.  4. Colon cancer: s/p sigmoid colectomy in 2007 5. Morbid obesity s/p lap band procedure.  6. Anemia 7. Obstructive Sleep Apnea: Using CPAP.  8. CHF: Diastolic.  Echo (5/12): EF 50-55%, mild LV dilation, mild LVH, moderate (grade II) diastolic dysfunction.  Echo (9/14) with EF 50-55%, mild LVH, moderately dilated LV, RV poorly visualized. - Echo (6/19): EF 55-60%, RV poorly visualized.  9. PFTs (9/10): mild obstructive defect 10.  Dobutamine stress echo (9/10): EF 55%, mild LV hypertrophy, no significant valvular disease, no ischemia or infarction  11. CKD stage 3 12. Ventral hernia s/p repair.   Current Outpatient Medications  Medication Sig Dispense Refill   Accu-Chek Softclix Lancets lancets CHECK BLOOD SUGAR 2 TIMES  DAILY 300 each 3   Albuterol Sulfate (PROAIR RESPICLICK) 976 (90 Base) MCG/ACT AEPB Inhale 1 puff into the lungs 4 (four) times daily as needed. 3 each 1   aspirin EC 81 MG tablet Take 1 tablet (81 mg total) by mouth daily. 90 tablet 1   atorvastatin (LIPITOR) 40 MG tablet TAKE 1 TABLET BY MOUTH DAILY. 90 tablet 1   bisoprolol (ZEBETA) 10 MG tablet Take 1 tablet (10 mg total) by mouth every evening. 90 tablet 1   blood glucose meter kit and supplies KIT Use to check blood sugar three times daily. Dispense  Freestyle Libre according to insurance preference. DX: E11.8 2 each 0   blood glucose meter kit and supplies Dispense based on patient and insurance preference. Use up to four times daily as directed. (FOR ICD-10 E10.9, E11.9). 1 each 0   Blood Glucose Monitoring Suppl (ACCU-CHEK AVIVA CONNECT) w/Device KIT 1 Act by Does not apply route 3 (three) times daily as needed. 1 kit 3   Blood Glucose Monitoring Suppl (ACCU-CHEK GUIDE) w/Device KIT      Cholecalciferol 50 MCG (2000 UT) TABS Take 1 tablet (2,000 Units total) by mouth daily. 90  tablet 1   Continuous Blood Gluc Receiver (FREESTYLE LIBRE 2 READER) DEVI 1 Act by Does not apply route daily. 2 each 5   Continuous Blood Gluc Sensor (FREESTYLE LIBRE 2 SENSOR) MISC 1 Act by Does not apply route daily. 2 each 5   cyanocobalamin 2000 MCG tablet Take 1 tablet (2,000 mcg total) by mouth daily. 90 tablet 1   docusate sodium (COLACE) 100 MG capsule Take 100 mg by mouth 2 (two) times daily.     Ferric Maltol (ACCRUFER) 30 MG CAPS Take 1 capsule by mouth in the morning and at bedtime. 180 capsule 1   furosemide (LASIX) 80 MG tablet Take 1 tablet (80 mg total) by mouth 2 (two) times daily. Take extra 0.5 tab as needed 180 tablet 1   Glucagon (GVOKE HYPOPEN 2-PACK) 1 MG/0.2ML SOAJ Inject 1 Act into the skin daily as needed. 2 mL 5   glucose blood (ACCU-CHEK AVIVA PLUS) test strip Use to test blood sugar twice daily. DX E11.8 200 strip 3   HYDROcodone-acetaminophen (NORCO) 10-325 MG tablet TAKE 1 TABLET BY MOUTH EVERY 6 HOURS AS NEEDED FOR MODERATE PAIN. 65 tablet 0   insulin detemir (LEVEMIR FLEXTOUCH) 100 UNIT/ML FlexPen Inject 240 Units into the skin daily. 75 mL 5   Insulin Pen Needle (ULTICARE SHORT PEN NEEDLES) 31G X 8 MM MISC USE TWO PEN NEEDLES DAILY WITH INSULIN PENS 200 each 2   levothyroxine (SYNTHROID) 50 MCG tablet TAKE 1 TABLET BY MOUTH DAILY BEFORE BREAKFAST. 90 tablet 0   losartan (COZAAR) 25 MG tablet TAKE 2 TABLETS BY MOUTH DAILY. 60 tablet 2   losartan (COZAAR) 50 MG tablet Take 1 tablet (50 mg total) by mouth daily. 90 tablet 1   Multiple Vitamins-Minerals (MULTIVITAMIN ADULT PO) Take 1 tablet by mouth daily.     potassium chloride SA (KLOR-CON) 20 MEQ tablet Take 1 tablet (20 mEq total) by mouth 2 (two) times daily. 180 tablet 1   RESTASIS 0.05 % ophthalmic emulsion PLACE 1 DROP INTO BOTH EYES TWICE A DAY 60 each 5   SYNJARDY 12.01-999 MG TABS TAKE 1 TABLET BY MOUTH  TWICE DAILY 180 tablet 1   thiamine (VITAMIN B-1) 100 MG tablet Take 1 tablet (100 mg total) by mouth  daily. 90 tablet 1   No current facility-administered medications for this encounter.    Allergies:   Shellfish allergy, Lisinopril, and Lovastatin   Social History:  The patient  reports that he has never smoked. He has never used smokeless tobacco. He reports that he does not currently use alcohol. He reports that he does not use drugs.   Family History:  The patient's family history includes CAD in his mother; Congestive Heart Failure in his mother; Diabetes in his father and mother; Heart attack (age of onset: 62) in his brother; Heart failure in his brother; Hepatitis in his mother.   ROS:  Please see the history  of present illness.   All other systems are personally reviewed and negative.   Exam:   BP 130/82   Pulse 66   Wt (!) 220.8 kg (486 lb 12.8 oz)   SpO2 96%   BMI 64.23 kg/m  General:  NAD. No resp difficulty, arrived in electric wheelchair. HEENT: Normal Neck: Supple. Thick neck, JVP difficult. Carotids 2+ bilat; no bruits. No lymphadenopathy or thryomegaly appreciated. Cor: PMI nondisplaced. Regular rate & rhythm. No rubs, gallops or murmurs. Lungs: Clear, diminished in bases Abdomen: Obese, nontender, nondistended. No hepatosplenomegaly. No bruits or masses. Good bowel sounds. Extremities: No cyanosis, clubbing, rash, trace LLE edema Neuro: Alert & oriented x 3, cranial nerves grossly intact. Moves all 4 extremities w/o difficulty. Affect pleasant.  Recent Labs: 03/29/2021: ALT 17; B Natriuretic Peptide 89.0; BUN 36; Creatinine, Ser 2.06; Hemoglobin 12.0; Platelets 225; Potassium 4.2; Sodium 139 04/06/2021: TSH 3.93  Personally reviewed   Wt Readings from Last 3 Encounters:  08/10/21 (!) 220.8 kg (486 lb 12.8 oz)  06/10/21 (!) 201.9 kg (445 lb)  12/08/20 (!) 201.9 kg (445 lb)    ASSESSMENT AND PLAN: 1. Chronic diastolic CHF: Echo (5409): EF 55-60%.  He is NYHA III-IIIb, functional status difficult due to super morbid obesity. Exam is difficult for volume due to  body habitus, but he does not look volume markedly overloaded on exam, however weight is up 30 lbs in past 6 months. Unclear if this is party caloric. He may require RHC to better assess volume status. Discussed with Dr. Aundra Dubin, I personally called cath lab and weight limit for table is 450 lbs. - Increase Lasix to 100 mg bid to see if this helps with symptoms. BMET/BNP today, repeat BMET in 10 days. - Continue bisoprolol 10 mg daily. - Continue losartan 50 mg daily.  - He is on empagliflozin/metformin for DM2 - Continue KCl 20 bid.  - Discussed low salt food choices and limiting fluid intake < 2 liters daily.  - He has a scale that will accommodate his weight and is agreeable to starting to weigh daily. - Refer to paramedicine to help with medication compliance. 2. Obesity: Had Lap band but regained weight during COVID. Weight up another 30 lbs. Body mass index is 64.23 kg/m. - He had not realized how much weight he has gained. He wants to get  back to exercising and swimming. - Refer to [employer] program. - He is on Vesta, ? If pharmacy referral for semaglutide would aid in weight loss. Will send PharmD message about condidacy. 3. HTN: Controlled today.  4. OSA: Intolerant CPAP. AHA 71 on sleep study 2020.  - Refer back to Sleep Medicine for mask options. I think this will help with his fatigue if he can tolerate CPAP w/ a different mask, which may then help getting him back to exercise routine. 5. CKD: Stage 3. Creatinine baseline ~1.8. Needs to avoid NSAIDs.  - BMET today.  6. DM2: A1c 9.7 (7/22).  - Continue insulin + Synjardy.  Refer to paramedicine to help with medication compliance.   Follow up in 2-3 months with Dr. Aundra Dubin.  Signed, Rafael Bihari, FNP  08/10/2021   [facility] [address] [facility] [place] [address] [phone] (office) [phone] (fax)

## 2021-08-09 ENCOUNTER — Telehealth: Payer: Self-pay | Admitting: Internal Medicine

## 2021-08-09 ENCOUNTER — Telehealth (HOSPITAL_COMMUNITY): Payer: Self-pay

## 2021-08-09 ENCOUNTER — Other Ambulatory Visit: Payer: Self-pay | Admitting: *Deleted

## 2021-08-09 NOTE — Telephone Encounter (Signed)
Called to confirm/remind patient of their appointment at the [facility] on 08/10/21.   Patient reminded to bring all medications and/or complete list.  Confirmed patient has transportation. Gave directions, instructed to utilize [place] parking.  Confirmed appointment prior to ending call.

## 2021-08-09 NOTE — Telephone Encounter (Signed)
1.Medication Requested: HYDROcodone-acetaminophen (NORCO) 10-325 MG tablet 2. Pharmacy (Name, [place], [facility]): [place], [address] Phone:  [phone]  Fax:  [phone]     3. On Med List: Y  4. Last Visit with PCP: 04/06/2021  5. Next visit date with PCP: 08/31/2021   Agent: Please be advised that RX refills may take up to 3 business days. We ask that you follow-up with your pharmacy.

## 2021-08-09 NOTE — Patient Outreach (Signed)
[place] [facility]) Care Management  08/09/2021  [name] 11/20/1955 [id number]   RN Health Coach attempted follow up outreach call to patient.  Patient was unavailable. HIPPA compliance voicemail message left with return callback number.  Plan: RN will call patient again within 30 days.  [place] Care Management [phone]

## 2021-08-10 ENCOUNTER — Telehealth (HOSPITAL_COMMUNITY): Payer: Self-pay | Admitting: Licensed Clinical Social Worker

## 2021-08-10 ENCOUNTER — Encounter (HOSPITAL_COMMUNITY): Payer: Self-pay

## 2021-08-10 ENCOUNTER — Ambulatory Visit (HOSPITAL_COMMUNITY)
Admission: RE | Admit: 2021-08-10 | Discharge: 2021-08-10 | Disposition: A | Discharge status: Home / Self Care | Payer: Medicare Other | Source: Ambulatory Visit | Attending: Family Medicine | Admitting: Family Medicine

## 2021-08-10 ENCOUNTER — Telehealth: Payer: Self-pay | Admitting: Student-PharmD

## 2021-08-10 ENCOUNTER — Other Ambulatory Visit: Payer: Self-pay | Admitting: Internal Medicine

## 2021-08-10 VITALS — BP 130/82 | HR 66 | Wt >= 6400 oz

## 2021-08-10 DIAGNOSIS — N183 Chronic kidney disease, stage 3 unspecified: Secondary | ICD-10-CM | POA: Insufficient documentation

## 2021-08-10 DIAGNOSIS — Z7984 Long term (current) use of oral hypoglycemic drugs: Secondary | ICD-10-CM | POA: Insufficient documentation

## 2021-08-10 DIAGNOSIS — I10 Essential (primary) hypertension: Secondary | ICD-10-CM

## 2021-08-10 DIAGNOSIS — G4733 Obstructive sleep apnea (adult) (pediatric): Secondary | ICD-10-CM | POA: Insufficient documentation

## 2021-08-10 DIAGNOSIS — I5032 Chronic diastolic (congestive) heart failure: Secondary | ICD-10-CM | POA: Insufficient documentation

## 2021-08-10 DIAGNOSIS — I130 Hypertensive heart and chronic kidney disease with heart failure and stage 1 through stage 4 chronic kidney disease, or unspecified chronic kidney disease: Secondary | ICD-10-CM | POA: Diagnosis not present

## 2021-08-10 DIAGNOSIS — Z6841 Body Mass Index (BMI) 40.0 and over, adult: Secondary | ICD-10-CM | POA: Diagnosis not present

## 2021-08-10 DIAGNOSIS — Z9884 Bariatric surgery status: Secondary | ICD-10-CM | POA: Insufficient documentation

## 2021-08-10 DIAGNOSIS — Z794 Long term (current) use of insulin: Secondary | ICD-10-CM | POA: Insufficient documentation

## 2021-08-10 DIAGNOSIS — M5416 Radiculopathy, lumbar region: Secondary | ICD-10-CM

## 2021-08-10 DIAGNOSIS — Z79899 Other long term (current) drug therapy: Secondary | ICD-10-CM | POA: Insufficient documentation

## 2021-08-10 DIAGNOSIS — E1142 Type 2 diabetes mellitus with diabetic polyneuropathy: Secondary | ICD-10-CM

## 2021-08-10 DIAGNOSIS — E1122 Type 2 diabetes mellitus with diabetic chronic kidney disease: Secondary | ICD-10-CM | POA: Insufficient documentation

## 2021-08-10 DIAGNOSIS — M159 Polyosteoarthritis, unspecified: Secondary | ICD-10-CM

## 2021-08-10 LAB — BASIC METABOLIC PANEL
Anion gap: 6 (ref 5–15)
BUN: 28 mg/dL — ABNORMAL HIGH (ref 8–23)
CO2: 33 mmol/L — ABNORMAL HIGH (ref 22–32)
Calcium: 8.8 mg/dL — ABNORMAL LOW (ref 8.9–10.3)
Chloride: 102 mmol/L (ref 98–111)
Creatinine, Ser: 1.89 mg/dL — ABNORMAL HIGH (ref 0.61–1.24)
GFR, Estimated: 39 mL/min — ABNORMAL LOW (ref >60)
Glucose, Bld: 59 mg/dL — ABNORMAL LOW (ref 70–99)
Potassium: 4.4 mmol/L (ref 3.5–5.1)
Sodium: 141 mmol/L (ref 135–145)

## 2021-08-10 LAB — BRAIN NATRIURETIC PEPTIDE: B Natriuretic Peptide: 272.2 pg/mL — ABNORMAL HIGH (ref 0.0–100.0)

## 2021-08-10 MED ORDER — HYDROCODONE-ACETAMINOPHEN 10-325 MG PO TABS: 1.0000 | ORAL_TABLET | Freq: Four times a day (QID) | ORAL | 0 refills | Status: DC | PRN

## 2021-08-10 MED ORDER — FUROSEMIDE 20 MG PO TABS: 100.0000 mg | ORAL_TABLET | Freq: Two times a day (BID) | ORAL | 3 refills | Status: DC

## 2021-08-10 NOTE — Telephone Encounter (Signed)
Check [place] registry last filled 07/13/2021.Marland KitchenJohny [name]

## 2021-08-10 NOTE — Patient Instructions (Signed)
INCREASE Lasix to 100mg  twice a day  Labs today We will only contact you if something comes back abnormal or we need to make some changes. Otherwise no news is good news!  Your physician has recommended that you have a sleep study. This test records several body functions during sleep, including: brain activity, eye movement, oxygen and carbon dioxide blood levels, heart rate and rhythm, breathing rate and rhythm, the flow of air through your mouth and nose, snoring, body muscle movements, and chest and belly movement. You will be called to schedule an appointment.  You have been referred to the Paramedicine program to help manage your medications at home. You will get a call to schedule this visit at your home.   Your physician recommends that you schedule a follow-up appointment in: 3 months with Dr Aundra Dubin.    Please call office at [phone] option 2 if you have any questions or concerns.   At the [facility], you and your health needs are our priority. As part of our continuing mission to provide you with exceptional heart care, we have created designated Provider Care Teams. These Care Teams include your primary Cardiologist (physician) and Advanced Practice Providers (APPs- Physician Assistants and Nurse Practitioners) who all work together to provide you with the care you need, when you need it.   You may see any of the following providers on your designated Care Team at your next follow up: Dr Glori Bickers Dr Haynes Kerns, NP Lyda Jester, [place] [facility] [place], [place] Audry Riles, PharmD   Please be sure to bring in all your medications bottles to every appointment.

## 2021-08-10 NOTE — Addendum Note (Signed)
Encounter addended by: Rafael Bihari, FNP on: 08/10/2021 1:46 PM  Actions taken: Clinical Note Signed

## 2021-08-10 NOTE — Progress Notes (Signed)
Heart and Vascular Care Navigation  08/10/2021  [name] [birth year] [id number]  Reason for Referral: Paramedicine Referral   Engaged with patient by telephone for initial visit for Heart and Vascular Care Coordination.                                                                                                   Paramedicine Initial Assessment:  Housing:  In what kind of housing do you live? House/apt/trailer/shelter? house  Do you live with anyone? no  Are you currently worried about losing your housing? no  Within the past 12 months have you ever stayed outside, in a car, tent, a shelter, or temporarily with someone? no  Within the past 12 months have you been unable to get utilities when it was really needed? no  Social:  What is your current marital status? single   Do you have family or friends who live locally? Sister just moved down from [place], has cousins in [place] and a niece in [place]- biggest support would be sister and pastor who is listed as emergency contact  Food:  Within the past 12 months were you ever worried that food would run out before you got money to buy more?no  Within the past 60months have you run out of food and didn't have money to buy more? no  Income:  What is your current source of income? SSDI  How hard is it for you to pay for the basics like food housing, medical care, and utilities? Not very hard- makes sure to budget and not overspend his means  Do you have outstanding medical bills? no  Insurance:  Are you currently insured? UHC Medicare  Do you have prescription coverage? yes   Transportation:  Do you have transportation to your medical appointments yes- has his own car and utilizes [employer]     Daily Health Needs: Do you have a working scale at home? Has a scale that we gave him in April but states it doesn't work any longer- says he is over the weight limit but the scale should go up to 550  How do you manage  your medications at home? Takes them out of the bottle- feels as if he has a good handle on this  Do you ever take your medications differently than prescribed? no  Do you have issues affording your medications? no  If yes, has this ever prevented you from obtaining medications? no  Do you have any concerns with mobility at home? Can complete ADLs without assistance  Do you use any assistive devices at home or have PCS at home? Uses a walker and a wheelchair, has an aid that comes in 2hours 3x week to clean  Do you have a PCP? Dr. Scarlette Calico  Do you have any trouble reading or writing? no  Are there any additional barriers you see to getting the care you need?  States he is currently concerned about his weight.  Part of this was attributed to poor sleep due to sleep apnea.  States that he is going back to pulmonologist to  get sleep study and work on getting CPAP back- had one before but didn't utilize due to be uncomfortable and it was repossessed.  CSW discussed options to assist him with losing weight- PREP, health coaching, healthy weight and wellness referral- pt is interested in the PREP program to help him get back to the gym.  CSW discussed with physician who is in agreement- requested order be put in by clinic staff.                                        HRT/VAS Care Coordination     Outpatient Care Team Community Paramedicine; Social Worker   Education officer, museum Name: [name]- [facility]- [id number]   Lives with: Self   Patient Current Insurance Coverage Managed Medicare   Patient Has Concern With Paying Medical Bills No   Does Patient Have Prescription Coverage? Yes   Home Assistive Devices/Equipment Cane (specify quad or straight); Walker (specify type); Eyeglasses; CBG Meter; CPAP; Wheelchair; Scales       Social History:                                                                             SDOH Screenings   Alcohol Screen: Low Risk    Last Alcohol  Screening Score (AUDIT): 0  Depression (PHQ2-9): Low Risk    PHQ-2 Score: 0  Financial Resource Strain: Low Risk    Difficulty of Paying Living Expenses: Not very hard  Food Insecurity: No Food Insecurity   Worried About Charity fundraiser in the Last Year: Never true   Ran Out of Food in the Last Year: Never true  Housing: Low Risk    Last Housing Risk Score: 0  Physical Activity: Inactive   Days of Exercise per Week: 0 days   Minutes of Exercise per Session: 0 min  Social Connections: Socially Isolated   Frequency of Communication with Friends and Family: More than three times a week   Frequency of Social Gatherings with Friends and Family: Once a week   Attends Religious Services: Never   Marine scientist or Organizations: No   Attends Music therapist: Never   Marital Status: Never married  Stress: Not on file  Tobacco Use: Low Risk    Smoking Tobacco Use: Never   Smokeless Tobacco Use: Never   Passive Exposure: Not on file  Transportation Needs: No Transportation Needs   Lack of Transportation (Medical): No   Lack of Transportation (Non-Medical): No    SDOH Interventions: Financial Resources:    N/a  Food Insecurity:   N/a  Housing Insecurity:   N/a  Transportation:   Transportation Interventions: [employer] Pension scheme manager) already enrolled     Follow-up plan:    Paramedic to be assigned and will follow up with pt regarding initial visit.  Will continue to follow and assist as needed  [name], [facility] Desk#: [phone] Cell#: [phone]

## 2021-08-10 NOTE — Telephone Encounter (Signed)
Received referral from [facility], FNP to consider starting semaglutide for weight loss for this patient. Patient meets criteria for use of semaglutide for weight loss given BMI 64.23. Hx lap band procedure years ago. Obesity complicated by chronic medical conditions including T2DM, CHF, HTN, PAD, OSA.   Patient has dual complete Medicare which covers Ozempic without a PA. Last A1c from July 9.7. Given uncontrolled DM, no need to adjust insulin dose empirically with GLP1RA start.   Called patient to discuss interest in starting Ozempic but unable to reach. LVM requesting call back.

## 2021-08-10 NOTE — Telephone Encounter (Signed)
-----   Message from Leeroy Bock, [place] sent at 08/10/2021 12:33 PM EST ----- Regarding: FW: semaglutide  ----- Message ----- From: Rafael Bihari, FNP Sent: 08/10/2021  12:00 PM EST To: Leeroy Bock, RPH-CPP Subject: semaglutide                                    Frazier Butt,  I wanted to touch base with you about this patient I am thinking of referring for possible semaglutide for weight loss. He is on [place] currently. Do you think he would be an appropriate referral for you?  Thanks so much, Allena Katz, DNP, FNP-BC

## 2021-08-11 NOTE — Telephone Encounter (Signed)
Patient returned call. States he is not sure if he wants to take medication for weight loss or not, but now is not a good time to talk and requests a call back next Tuesday to discuss the medication. I will call him at that time.

## 2021-08-18 ENCOUNTER — Other Ambulatory Visit: Payer: Self-pay | Admitting: *Deleted

## 2021-08-18 ENCOUNTER — Telehealth: Payer: Self-pay | Admitting: *Deleted

## 2021-08-18 NOTE — Patient Outreach (Signed)
[facility]) Care Management  08/18/2021  Charlene Cowdrey Jul 01, 1956 [id number]   Patient is being transferred to the Chronic Care Management program with provider office.   Plan: Case closure for [place] Management [phone]

## 2021-08-18 NOTE — Chronic Care Management (AMB) (Signed)
  Care Management   Note  08/18/2021 Name: [name] MRN: [id number] DOB: 1956/07/28  [name] is a 65 y.o. year old male who is a primary care patient of [name], MD and is actively engaged with the care management team. I reached out to [name] by phone today to assist with scheduling an initial visit with the RN Case Manager  Follow up plan: Telephone appointment with care management team member scheduled for:08/24/21  [place] Management  Direct Dial: [phone]

## 2021-08-20 NOTE — Progress Notes (Signed)
Call to patient reference PREP referral Explained program to pt.  Currently a member at [employer]. Not currently exercising due not walking well.  Would benefit from PREP to build strength to begin exercising again.  Agreeable to starting PREP however requests an AM class. Next class at [place] is afternoon. Unable to do a 1pm-215pm class Advised will call him back when I have a am class at [place].

## 2021-08-23 ENCOUNTER — Telehealth (HOSPITAL_COMMUNITY): Payer: Self-pay

## 2021-08-23 NOTE — Telephone Encounter (Signed)
Spoke to [place] in reference to setting up initial paramedicine visit. We agreed on Wednesday at 3:00. Call complete.

## 2021-08-24 ENCOUNTER — Telehealth: Payer: Medicare Other

## 2021-08-24 ENCOUNTER — Telehealth: Payer: Self-pay | Admitting: *Deleted

## 2021-08-24 ENCOUNTER — Encounter: Payer: Self-pay | Admitting: *Deleted

## 2021-08-24 NOTE — Telephone Encounter (Signed)
  Chronic Care Management   Follow Up Note   08/24/2021 Name: [name] MRN: [id number] DOB: 1956-01-06  Referred by: [name], MD Reason for referral : Chronic Care Management (CCM RN CM Initial Outreach attempt- Unsuccessful)  An unsuccessful telephone outreach was attempted today. The patient was referred to the case management team for assistance with care management and care coordination.   Follow Up Plan:  A HIPPA compliant phone message was left for the patient providing contact information and requesting a return call Will place request with scheduling care guide to contact patient to re-schedule today's missed CCM RN initial telephone appointment if I do not hear back from patient by end of day  Oneta Rack, RN, BSN, [place] [phone]: direct office

## 2021-08-24 NOTE — Telephone Encounter (Signed)
Called patient again to discuss interest in starting Ozempic for weight loss. He was hesitant about starting and asks how it works and if it can cause constipation. I explained that it slows gastric emptying which can cause constipation for some patients. He states that he "already has enough constipation" and does not want to start something that could contribute to that more. I let him know that if he changes his mind in the future we are happy to discuss this again with him, and he thanked me for the call.

## 2021-08-25 ENCOUNTER — Telehealth (HOSPITAL_COMMUNITY): Payer: Self-pay | Admitting: Licensed Clinical Social Worker

## 2021-08-25 ENCOUNTER — Ambulatory Visit: Payer: Self-pay | Admitting: *Deleted

## 2021-08-25 NOTE — Telephone Encounter (Signed)
HF Paramedicine Team Based Care Meeting  HF MD- NA  HF NP - Amy Clegg NP-C   [facility] HF Paramedicine  Bari Mantis  Discussed during visit- have not been able to establish care with him at this time-initial visit planned for Monday.  Jorge Ny, LCSW Clinical Social Worker [facility] Desk#: [phone] Cell#: [phone]

## 2021-08-26 ENCOUNTER — Telehealth: Payer: Self-pay | Admitting: *Deleted

## 2021-08-26 NOTE — Chronic Care Management (AMB) (Signed)
  Care Management   Note  08/26/2021 Name: [name] MRN: [id number] DOB: September 16, 1956  [name] is a 65 y.o. year old male who is a primary care patient of [name], MD and is actively engaged with the care management team. I reached out to [employer] by phone today to assist with re-scheduling an initial visit with the RN Case Manager  Follow up plan: Unsuccessful telephone outreach attempt made. A HIPAA compliant phone message was left for the patient providing contact information and requesting a return call.  The care management team will reach out to the patient again over the next 7 days.  If patient returns call to provider office, please advise to call [place] at [phone].  [place] Management  Direct Dial: [phone]

## 2021-08-30 ENCOUNTER — Telehealth (HOSPITAL_COMMUNITY): Payer: Self-pay

## 2021-08-30 ENCOUNTER — Other Ambulatory Visit (HOSPITAL_COMMUNITY): Payer: Self-pay | Admitting: Cardiology

## 2021-08-30 ENCOUNTER — Other Ambulatory Visit: Payer: Self-pay | Admitting: Internal Medicine

## 2021-08-30 DIAGNOSIS — E1142 Type 2 diabetes mellitus with diabetic polyneuropathy: Secondary | ICD-10-CM

## 2021-08-30 DIAGNOSIS — I5032 Chronic diastolic (congestive) heart failure: Secondary | ICD-10-CM

## 2021-08-30 DIAGNOSIS — I10 Essential (primary) hypertension: Secondary | ICD-10-CM

## 2021-08-30 DIAGNOSIS — M5416 Radiculopathy, lumbar region: Secondary | ICD-10-CM

## 2021-08-30 DIAGNOSIS — M159 Polyosteoarthritis, unspecified: Secondary | ICD-10-CM

## 2021-08-30 NOTE — Telephone Encounter (Signed)
Called [name] to reschedule due to scheduling conflicts. I will see him on Wednesday in the home at 3:00.

## 2021-08-31 ENCOUNTER — Ambulatory Visit: Payer: Medicare Other | Admitting: Internal Medicine

## 2021-09-01 ENCOUNTER — Other Ambulatory Visit (HOSPITAL_COMMUNITY): Payer: Self-pay

## 2021-09-01 NOTE — Progress Notes (Signed)
Paramedicine Encounter    Patient ID: [name], male    DOB: 06-20-1956, 65 y.o.   MRN: [id number]   Arrived for home visit for [name] for initial paramedicine meeting. [name] reports feeling okay but constipated causing him some abdominal pain and discomfort. He reports using laxatives today and it has helped some.   We reviewed medications and he reports he takes them from the bottles and does not want to use a pill box. Med count is accurate and he is taking his medications.   He needs potassium and albuterol inhaler sent to [facility].   [name] reports no chest pain, dizziness but states he is always short of breath but this has not worsened. Some swelling and edema noted to lower legs. Lungs clear.   He reports some tremors in both hands over the last few weeks. I advised him to report this to his PCP.   He is in need of a large scale as he does not have one. I am going to assist in sending some links to his phone for him to purchase, he is agreeable.   Ezechiel agreed to bi-weekly visits. I will plan to see him in two weeks.   CBG- 248 (after a meal)   Patient Care Team: [name], MD as PCP - General (Internal Medicine) Larey Dresser, MD as PCP - Advanced Heart Failure (Cardiology) Gatha Mayer, MD (Gastroenterology) Charlton Haws, [facility] as Pharmacist (Pharmacist) Knox Royalty, RN as Case Manager  Patient Active Problem List   Diagnosis Date Noted   Insulin-requiring or dependent type II diabetes mellitus ([place]) 07/27/2021   Encounter for general adult medical examination with abnormal findings 04/06/2021   Calculus of gallbladder without cholecystitis without obstruction 04/06/2021   Chronic idiopathic constipation 11/09/2020   PAD (peripheral artery disease) ([place]) 07/24/2020   Ulcer of left foot, limited to breakdown of skin ([place]) 07/23/2020   Drug-induced erectile dysfunction 05/22/2019   Onychomycosis of toenail 05/21/2019   Class 3  obesity with alveolar hypoventilation, serious comorbidity, and body mass index (BMI) of 50.0 to 59.9 in adult ([place]) 02/07/2019   Ventral hernia without obstruction or gangrene 05/31/2018   Therapeutic opioid-induced constipation (OIC) 05/31/2018   Long-term current use of opiate analgesic 02/15/2018   Left lumbar radiculopathy 01/29/2018   B12 deficiency 10/17/2017   Bleeding internal hemorrhoids    Insomnia 06/30/2017   Left eye affected by proliferative diabetic retinopathy with traction retinal detachment not involving macula, associated with type 2 diabetes mellitus ([place]) 04/20/2017   Right eye affected by proliferative diabetic retinopathy with traction retinal detachment involving macula, associated with type 2 diabetes mellitus ([place]) 04/20/2017   Vitamin D deficiency 12/23/2016   Thiamine deficiency 08/02/2016   Primary osteoarthritis involving multiple joints 08/03/2015   Diabetes mellitus type II, uncontrolled 05/23/2014   Herpesviral keratitis 05/23/2014   Diabetes mellitus due to underlying condition with both eyes affected by proliferative retinopathy without macular edema, with long-term current use of insulin ([place]) 05/23/2014   Diabetic foot ulcer 02/05/2014   Iron deficiency anemia 01/09/2014   Osteoarthritis of hip 05/08/2013   Diabetic nephropathy ([place]) 05/07/2013   Bariatric surgery status 01/29/2013   Benign prostatic hyperplasia without lower urinary tract symptoms 01/11/2013   Hypothyroidism 01/11/2013   Colon cancer ([place]) 10/04/2012   Chronic diastolic CHF (congestive heart failure) ([place]) 04/13/2012   Diabetic peripheral neuropathy ([place]) 05/29/2008   OSA (obstructive sleep apnea) 10/01/2007   CARDIOMYOPATHY 10/01/2007   Type II  diabetes mellitus with manifestations ([place]) 07/24/2007   Hyperlipidemia with target LDL less than 100 07/24/2007   Essential hypertension 07/24/2007    Current Outpatient Medications:    Accu-Chek Softclix Lancets lancets, CHECK BLOOD  SUGAR 2 TIMES  DAILY, Disp: 300 each, Rfl: 3   Albuterol Sulfate (PROAIR RESPICLICK) 449 (90 Base) MCG/ACT AEPB, Inhale 1 puff into the lungs 4 (four) times daily as needed., Disp: 3 each, Rfl: 1   aspirin EC 81 MG tablet, Take 1 tablet (81 mg total) by mouth daily., Disp: 90 tablet, Rfl: 1   atorvastatin (LIPITOR) 40 MG tablet, TAKE 1 TABLET BY MOUTH DAILY., Disp: 90 tablet, Rfl: 1   bisoprolol (ZEBETA) 10 MG tablet, Take 1 tablet (10 mg total) by mouth every evening., Disp: 90 tablet, Rfl: 1   blood glucose meter kit and supplies KIT, Use to check blood sugar three times daily. Dispense Freestyle Libre according to insurance preference. DX: E11.8, Disp: 2 each, Rfl: 0   blood glucose meter kit and supplies, Dispense based on patient and insurance preference. Use up to four times daily as directed. (FOR ICD-10 E10.9, E11.9)., Disp: 1 each, Rfl: 0   Blood Glucose Monitoring Suppl (ACCU-CHEK AVIVA CONNECT) w/Device KIT, 1 Act by Does not apply route 3 (three) times daily as needed., Disp: 1 kit, Rfl: 3   Blood Glucose Monitoring Suppl (ACCU-CHEK GUIDE) w/Device KIT, , Disp: , Rfl:    Cholecalciferol 50 MCG (2000 UT) TABS, Take 1 tablet (2,000 Units total) by mouth daily., Disp: 90 tablet, Rfl: 1   Continuous Blood Gluc Receiver (FREESTYLE LIBRE 2 READER) DEVI, 1 Act by Does not apply route daily., Disp: 2 each, Rfl: 5   Continuous Blood Gluc Sensor (FREESTYLE LIBRE 2 SENSOR) MISC, 1 Act by Does not apply route daily., Disp: 2 each, Rfl: 5   cyanocobalamin 2000 MCG tablet, Take 1 tablet (2,000 mcg total) by mouth daily., Disp: 90 tablet, Rfl: 1   docusate sodium (COLACE) 100 MG capsule, Take 100 mg by mouth 2 (two) times daily., Disp: , Rfl:    Ferric Maltol (ACCRUFER) 30 MG CAPS, Take 1 capsule by mouth in the morning and at bedtime., Disp: 180 capsule, Rfl: 1   furosemide (LASIX) 20 MG tablet, Take 5 tablets (100 mg total) by mouth 2 (two) times daily., Disp: 300 tablet, Rfl: 3   Glucagon (GVOKE  HYPOPEN 2-PACK) 1 MG/0.2ML SOAJ, Inject 1 Act into the skin daily as needed., Disp: 2 mL, Rfl: 5   glucose blood (ACCU-CHEK AVIVA PLUS) test strip, Use to test blood sugar twice daily. DX E11.8, Disp: 200 strip, Rfl: 3   HYDROcodone-acetaminophen (NORCO) 10-325 MG tablet, Take 1 tablet by mouth every 6 (six) hours as needed for moderate pain., Disp: 65 tablet, Rfl: 0   insulin detemir (LEVEMIR FLEXTOUCH) 100 UNIT/ML FlexPen, Inject 240 Units into the skin daily., Disp: 75 mL, Rfl: 5   Insulin Pen Needle (ULTICARE SHORT PEN NEEDLES) 31G X 8 MM MISC, USE TWO PEN NEEDLES DAILY WITH INSULIN PENS, Disp: 200 each, Rfl: 2   levothyroxine (SYNTHROID) 50 MCG tablet, TAKE 1 TABLET BY MOUTH DAILY BEFORE BREAKFAST., Disp: 90 tablet, Rfl: 0   losartan (COZAAR) 25 MG tablet, TAKE 2 TABLETS BY MOUTH DAILY., Disp: 60 tablet, Rfl: 2   losartan (COZAAR) 50 MG tablet, Take 1 tablet (50 mg total) by mouth daily., Disp: 90 tablet, Rfl: 1   Multiple Vitamins-Minerals (MULTIVITAMIN ADULT PO), Take 1 tablet by mouth daily., Disp: , Rfl:  potassium chloride SA (KLOR-CON M) 20 MEQ tablet, TAKE 1 TABLET BY MOUTH 2 TIMES DAILY., Disp: 180 tablet, Rfl: 3   RESTASIS 0.05 % ophthalmic emulsion, PLACE 1 DROP INTO BOTH EYES TWICE A DAY, Disp: 60 each, Rfl: 5   SYNJARDY 12.01-999 MG TABS, TAKE 1 TABLET BY MOUTH  TWICE DAILY, Disp: 180 tablet, Rfl: 1   thiamine (VITAMIN B-1) 100 MG tablet, Take 1 tablet (100 mg total) by mouth daily., Disp: 90 tablet, Rfl: 1 Allergies  Allergen Reactions   Shellfish Allergy Anaphylaxis   Lisinopril Cough   Lovastatin Itching     Social History   Socioeconomic History   Marital status: Legally Separated    Spouse name: Not on file   Number of children: 0   Years of education: Not on file   Highest education level: Not on file  Occupational History   Occupation: Human resources officer  Tobacco Use   Smoking status: Never   Smokeless tobacco: Never  Vaping Use   Vaping Use: Never used   Substance and Sexual Activity   Alcohol use: Not Currently    Alcohol/week: 0.0 standard drinks    Comment: states he does not drink    Drug use: No   Sexual activity: Not Currently  Other Topics Concern   Not on file  Social History Narrative   HSG, 2 years of [place]   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   In new relationship (04/2009) - girlfriend helps taste for cooking business and helps monitor for ulcers.   Regular Exercise- No      Social Determinants of Health   Financial Resource Strain: Low Risk    Difficulty of Paying Living Expenses: Not very hard  Food Insecurity: No Food Insecurity   Worried About Running Out of Food in the Last Year: Never true   Ran Out of Food in the Last Year: Never true  Transportation Needs: No Transportation Needs   Lack of Transportation (Medical): No   Lack of Transportation (Non-Medical): No  Physical Activity: Inactive   Days of Exercise per Week: 0 days   Minutes of Exercise per Session: 0 min  Stress: Not on file  Social Connections: Socially Isolated   Frequency of Communication with Friends and Family: More than three times a week   Frequency of Social Gatherings with Friends and Family: Once a week   Attends Religious Services: Never   Marine scientist or Organizations: No   Attends Archivist Meetings: Never   Marital Status: Never married  Intimate Partner Violence: Not on file    Physical Exam Vitals reviewed.  Constitutional:      Appearance: He is obese.  HENT:     Head: Normocephalic.     Nose: Nose normal.     Mouth/Throat:     Mouth: Mucous membranes are moist.     Pharynx: Oropharynx is clear.  Eyes:     Conjunctiva/sclera: Conjunctivae normal.     Pupils: Pupils are equal, round, and reactive to light.  Cardiovascular:      Rate and Rhythm: Normal rate and regular rhythm.     Heart sounds: Normal heart sounds.  Pulmonary:     Effort: Pulmonary effort is normal.     Breath sounds: Normal breath sounds.  Abdominal:  General: There is distension.     Tenderness: There is abdominal tenderness.  Musculoskeletal:        General: Swelling present. Normal range of motion.     Right lower leg: Edema present.     Left lower leg: Edema present.  Skin:    General: Skin is warm and dry.     Capillary Refill: Capillary refill takes less than 2 seconds.  Neurological:     General: No focal deficit present.     Mental Status: He is alert. Mental status is at baseline.  Psychiatric:        Mood and Affect: Mood normal.        Future Appointments  Date Time Provider [place]  09/06/2021 10:40 AM Horald Pollen, MD [facility] None  09/22/2021  8:30 AM Gatha Mayer, MD [facility]  11/10/2021  9:00 AM Larey Dresser, MD [facility] None     ACTION: Home visit completed

## 2021-09-02 ENCOUNTER — Other Ambulatory Visit (HOSPITAL_COMMUNITY): Payer: Self-pay | Admitting: *Deleted

## 2021-09-03 NOTE — Chronic Care Management (AMB) (Signed)
°  Care Management   Note  09/03/2021 Name: [name] MRN: [id number] DOB: 01-Jan-1956  [name] is a 65 y.o. year old male who is a primary care patient of [name], MD and is actively engaged with the care management team. I reached out to [employer] by phone today to assist with re-scheduling an initial visit with the RN Case Manager  Follow up plan: Unsuccessful telephone outreach attempt made. A HIPAA compliant phone message was left for the patient providing contact information and requesting a return call.  The care management team will reach out to the patient again over the next 7-14 days.  If patient returns call to provider office, please advise to call [place] at [phone].  [place] Management  Direct Dial: [phone]

## 2021-09-06 ENCOUNTER — Encounter: Payer: Self-pay | Admitting: Emergency Medicine

## 2021-09-06 ENCOUNTER — Telehealth (INDEPENDENT_AMBULATORY_CARE_PROVIDER_SITE_OTHER): Payer: Medicare Other | Admitting: Emergency Medicine

## 2021-09-06 DIAGNOSIS — L089 Local infection of the skin and subcutaneous tissue, unspecified: Secondary | ICD-10-CM | POA: Diagnosis not present

## 2021-09-06 MED ORDER — CEFADROXIL 500 MG PO CAPS: 500.0000 mg | ORAL_CAPSULE | Freq: Two times a day (BID) | ORAL | 0 refills | Status: AC

## 2021-09-06 NOTE — Progress Notes (Signed)
Telemedicine Encounter- SOAP NOTE Established Patient MyChart video conference attempted but unsuccessful Patient: Home  Provider: Office   Patient present only  This telephone encounter was conducted with the patient's (or proxy's) verbal consent via audio telecommunications: yes/no: Yes Patient was instructed to have this encounter in a suitably private space; and to only have persons present to whom they give permission to participate. In addition, patient identity was confirmed by use of name plus two identifiers (DOB and address).  I discussed the limitations, risks, security and privacy concerns of performing an evaluation and management service by telephone and the availability of in person appointments. I also discussed with the patient that there may be a patient responsible charge related to this service. The patient expressed understanding and agreed to proceed.  I spent a total of TIME; 0 MIN TO 60 MIN: 20 minutes talking with the patient or their proxy.  Chief complaint: Possible skin infection  Subjective  Acute problem visit today. [name] is a 65 y.o. established patient of Dr. Scarlette Calico. Telephone visit today complaining of possible skin infection that started about 2 weeks ago.  States area above umbilicus is red, sore, with some drainage.  Denies fever or chills.  Denies nausea or vomiting.  Able to eat and drink.  Denies abdominal pain or diarrhea.  No diffuse rash.  States area is very well localized. Does not want to go to urgent care center and did not want to come to the office. No other complaints or medical concerns today.  HPI   Patient Active Problem List   Diagnosis Date Noted   Insulin-requiring or dependent type II diabetes mellitus ([place]) 07/27/2021   Encounter for general adult medical examination with abnormal findings 04/06/2021   Calculus of gallbladder without cholecystitis without obstruction 04/06/2021   Chronic idiopathic constipation  11/09/2020   PAD (peripheral artery disease) ([place]) 07/24/2020   Ulcer of left foot, limited to breakdown of skin ([place]) 07/23/2020   Drug-induced erectile dysfunction 05/22/2019   Onychomycosis of toenail 05/21/2019   Class 3 obesity with alveolar hypoventilation, serious comorbidity, and body mass index (BMI) of 50.0 to 59.9 in adult ([place]) 02/07/2019   Ventral hernia without obstruction or gangrene 05/31/2018   Therapeutic opioid-induced constipation (OIC) 05/31/2018   Long-term current use of opiate analgesic 02/15/2018   Left lumbar radiculopathy 01/29/2018   B12 deficiency 10/17/2017   Bleeding internal hemorrhoids    Insomnia 06/30/2017   Left eye affected by proliferative diabetic retinopathy with traction retinal detachment not involving macula, associated with type 2 diabetes mellitus ([place]) 04/20/2017   Right eye affected by proliferative diabetic retinopathy with traction retinal detachment involving macula, associated with type 2 diabetes mellitus ([place]) 04/20/2017   Vitamin D deficiency 12/23/2016   Thiamine deficiency 08/02/2016   Primary osteoarthritis involving multiple joints 08/03/2015   Diabetes mellitus type II, uncontrolled 05/23/2014   Herpesviral keratitis 05/23/2014   Diabetes mellitus due to underlying condition with both eyes affected by proliferative retinopathy without macular edema, with long-term current use of insulin ([place]) 05/23/2014   Diabetic foot ulcer 02/05/2014   Iron deficiency anemia 01/09/2014   Osteoarthritis of hip 05/08/2013   Diabetic nephropathy ([place]) 05/07/2013   Bariatric surgery status 01/29/2013   Benign prostatic hyperplasia without lower urinary tract symptoms 01/11/2013   Hypothyroidism 01/11/2013   Colon cancer ([place]) 10/04/2012   Chronic diastolic CHF (congestive heart failure) ([place]) 04/13/2012   Diabetic peripheral neuropathy ([place]) 05/29/2008   OSA (obstructive sleep apnea) 10/01/2007  CARDIOMYOPATHY 10/01/2007   Type II diabetes  mellitus with manifestations ([place]) 07/24/2007   Hyperlipidemia with target LDL less than 100 07/24/2007   Essential hypertension 07/24/2007    Past Medical History:  Diagnosis Date   Anemia    iron   CHF (congestive heart failure) (HCC)    Presumed diastolic. Echo (06/07) w.EF 45%, severe posterior HK, mild LV hypertrophy, No further work-up of abnormal echo was done. pt denies CHF   Colon cancer ([place]) 2007   s/p sigmoid colectomy   Diabetes mellitus    Has Hx of diabetic foot ulcer & peripheral neuropathy  type2   Dyspnea    w/ activity   History of PFTs 05/2009   Mild Obstructive defect   HTN (hypertension)    Hyperlipidemia    Morbid obesity (HCC)    OSA on CPAP    Pneumonia    week ago     Current Outpatient Medications  Medication Sig Dispense Refill   cefadroxil (DURICEF) 500 MG capsule Take 1 capsule (500 mg total) by mouth 2 (two) times daily for 7 days. 14 capsule 0   Accu-Chek Softclix Lancets lancets CHECK BLOOD SUGAR 2 TIMES  DAILY 300 each 3   Albuterol Sulfate (PROAIR RESPICLICK) 914 (90 Base) MCG/ACT AEPB Inhale 1 puff into the lungs 4 (four) times daily as needed. 3 each 1   aspirin EC 81 MG tablet Take 1 tablet (81 mg total) by mouth daily. 90 tablet 1   atorvastatin (LIPITOR) 40 MG tablet TAKE 1 TABLET BY MOUTH DAILY. 90 tablet 1   bisoprolol (ZEBETA) 10 MG tablet Take 1 tablet (10 mg total) by mouth every evening. 90 tablet 1   blood glucose meter kit and supplies KIT Use to check blood sugar three times daily. Dispense Freestyle Libre according to insurance preference. DX: E11.8 2 each 0   blood glucose meter kit and supplies Dispense based on patient and insurance preference. Use up to four times daily as directed. (FOR ICD-10 E10.9, E11.9). 1 each 0   Blood Glucose Monitoring Suppl (ACCU-CHEK AVIVA CONNECT) w/Device KIT 1 Act by Does not apply route 3 (three) times daily as needed. 1 kit 3   Blood Glucose Monitoring Suppl (ACCU-CHEK GUIDE) w/Device KIT       Cholecalciferol 50 MCG (2000 UT) TABS Take 1 tablet (2,000 Units total) by mouth daily. (Patient not taking: Reported on 09/01/2021) 90 tablet 1   Continuous Blood Gluc Receiver (FREESTYLE LIBRE 2 READER) DEVI 1 Act by Does not apply route daily. 2 each 5   Continuous Blood Gluc Sensor (FREESTYLE LIBRE 2 SENSOR) MISC 1 Act by Does not apply route daily. 2 each 5   cyanocobalamin 2000 MCG tablet Take 1 tablet (2,000 mcg total) by mouth daily. 90 tablet 1   docusate sodium (COLACE) 100 MG capsule Take 100 mg by mouth 2 (two) times daily.     Ferric Maltol (ACCRUFER) 30 MG CAPS Take 1 capsule by mouth in the morning and at bedtime. (Patient not taking: Reported on 09/01/2021) 180 capsule 1   furosemide (LASIX) 20 MG tablet Take 5 tablets (100 mg total) by mouth 2 (two) times daily. 300 tablet 3   Glucagon (GVOKE HYPOPEN 2-PACK) 1 MG/0.2ML SOAJ Inject 1 Act into the skin daily as needed. (Patient not taking: Reported on 09/01/2021) 2 mL 5   glucose blood (ACCU-CHEK AVIVA PLUS) test strip Use to test blood sugar twice daily. DX E11.8 200 strip 3   HYDROcodone-acetaminophen (NORCO) 10-325 MG tablet  Take 1 tablet by mouth every 6 (six) hours as needed for moderate pain. 65 tablet 0   insulin detemir (LEVEMIR FLEXTOUCH) 100 UNIT/ML FlexPen Inject 240 Units into the skin daily. 75 mL 5   Insulin Pen Needle (ULTICARE SHORT PEN NEEDLES) 31G X 8 MM MISC USE TWO PEN NEEDLES DAILY WITH INSULIN PENS 200 each 2   levothyroxine (SYNTHROID) 50 MCG tablet TAKE 1 TABLET BY MOUTH DAILY BEFORE BREAKFAST. 90 tablet 0   losartan (COZAAR) 25 MG tablet TAKE 2 TABLETS BY MOUTH DAILY. 60 tablet 2   losartan (COZAAR) 50 MG tablet Take 1 tablet (50 mg total) by mouth daily. 90 tablet 1   Multiple Vitamins-Minerals (MULTIVITAMIN ADULT PO) Take 1 tablet by mouth daily.     potassium chloride SA (KLOR-CON M) 20 MEQ tablet TAKE 1 TABLET BY MOUTH 2 TIMES DAILY. 180 tablet 3   RESTASIS 0.05 % ophthalmic emulsion PLACE 1 DROP INTO  BOTH EYES TWICE A DAY 60 each 5   SYNJARDY 12.01-999 MG TABS TAKE 1 TABLET BY MOUTH  TWICE DAILY 180 tablet 1   thiamine (VITAMIN B-1) 100 MG tablet Take 1 tablet (100 mg total) by mouth daily. 90 tablet 1   No current facility-administered medications for this visit.    Allergies  Allergen Reactions   Shellfish Allergy Anaphylaxis   Lisinopril Cough   Lovastatin Itching    Social History   Socioeconomic History   Marital status: Legally Separated    Spouse name: Not on file   Number of children: 0   Years of education: Not on file   Highest education level: Not on file  Occupational History   Occupation: Human resources officer  Tobacco Use   Smoking status: Never   Smokeless tobacco: Never  Vaping Use   Vaping Use: Never used  Substance and Sexual Activity   Alcohol use: Not Currently    Alcohol/week: 0.0 standard drinks    Comment: states he does not drink    Drug use: No   Sexual activity: Not Currently  Other Topics Concern   Not on file  Social History Narrative   HSG, 2 years of [employer]   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   In new relationship (04/2009) - girlfriend helps taste for cooking business and helps monitor for ulcers.   Regular Exercise- No      Social Determinants of Health   Financial Resource Strain: Low Risk    Difficulty of Paying Living Expenses: Not very hard  Food Insecurity: No Food Insecurity   Worried About Running Out of Food in the Last Year: Never true   Ran Out of Food in the Last Year: Never true  Transportation Needs: No Transportation Needs   Lack of Transportation (Medical): No   Lack of Transportation (Non-Medical): No  Physical Activity: Inactive   Days of Exercise per Week: 0 days   Minutes of Exercise per Session: 0 min  Stress: Not on file  Social Connections:  Socially Isolated   Frequency of Communication with Friends and Family: More than three times a week   Frequency of Social Gatherings with Friends and Family: Once a week   Attends Religious Services: Never   Marine scientist or Organizations: No   Attends Archivist Meetings: Never  Marital Status: Never married  Intimate Partner Violence: Not on file    Review of Systems  Constitutional: Negative.  Negative for chills and fever.  HENT: Negative.  Negative for congestion and sore throat.   Respiratory: Negative.  Negative for cough and shortness of breath.   Cardiovascular: Negative.  Negative for chest pain and palpitations.  Gastrointestinal: Negative.  Negative for abdominal pain, diarrhea, nausea and vomiting.  Genitourinary: Negative.   Skin:  Positive for itching and rash.  Neurological:  Negative for dizziness and headaches.  All other systems reviewed and are negative.  Objective  Alert and oriented x3 in no apparent respiratory distress. Vitals as reported by the patient: There were no vitals filed for this visit.  Diagnoses and all orders for this visit:  Skin infection -     cefadroxil (DURICEF) 500 MG capsule; Take 1 capsule (500 mg total) by mouth 2 (two) times daily for 7 days.  Clinically stable.  Patient did not want to come to the office and does not want to go to urgent care center. Unable to see and evaluate skin.  Description likely a skin infection.  Advised to take medication as prescribed and contact the office if no better or worse during the next several days.   I discussed the assessment and treatment plan with the patient. The patient was provided an opportunity to ask questions and all were answered. The patient agreed with the plan and demonstrated an understanding of the instructions.   The patient was advised to call back or seek an in-person evaluation if the symptoms worsen or if the condition fails to improve as  anticipated.  I provided 20 minutes of non-face-to-face time during this encounter.  Horald Pollen, MD  Primary Care at [facility]

## 2021-09-07 ENCOUNTER — Telehealth: Payer: Self-pay | Admitting: Internal Medicine

## 2021-09-07 ENCOUNTER — Other Ambulatory Visit: Payer: Self-pay | Admitting: Internal Medicine

## 2021-09-07 ENCOUNTER — Other Ambulatory Visit (HOSPITAL_COMMUNITY): Payer: Self-pay | Admitting: Cardiology

## 2021-09-07 DIAGNOSIS — M159 Polyosteoarthritis, unspecified: Secondary | ICD-10-CM

## 2021-09-07 DIAGNOSIS — E1142 Type 2 diabetes mellitus with diabetic polyneuropathy: Secondary | ICD-10-CM

## 2021-09-07 DIAGNOSIS — M5416 Radiculopathy, lumbar region: Secondary | ICD-10-CM

## 2021-09-07 MED ORDER — HYDROCODONE-ACETAMINOPHEN 10-325 MG PO TABS: 1.0000 | ORAL_TABLET | Freq: Four times a day (QID) | ORAL | 0 refills | Status: DC | PRN

## 2021-09-07 NOTE — Telephone Encounter (Signed)
1.Medication Requested: HYDROcodone-acetaminophen (NORCO) 10-325 MG tablet  2. Pharmacy (Name, [facility], [place]): [place], [place]  3. On Med List: yes  4. Last Visit with PCP: 04-06-2021  5. Next visit date with PCP: 10-07-2021  Patient had a vv w/ Dr. Mitchel Honour 09-06-2021

## 2021-09-14 NOTE — Chronic Care Management (AMB) (Signed)
°  Care Management   Note  09/14/2021 Name: Brave Dack MRN: [id number] DOB: September 11, 1956  Halsey Hammen is a 65 y.o. year old male who is a primary care patient of [name], MD and is actively engaged with the care management team. I reached out to [employer] by phone today to assist with re-scheduling an initial visit with the RN Case Manager  Follow up plan: A third unsuccessful telephone outreach attempt made. A HIPAA compliant phone message was left for the patient providing contact information and requesting a return call. Unable to make contact on outreach attempts x 3. PCP Dr. [name] notified via routed documentation in medical record. We have been unable to make contact with the patient for follow up. The care management team is available to follow up with the patient after provider conversation with the patient regarding recommendation for care management engagement and subsequent re-referral to the care management team. If patient returns call to provider office, please advise to call [place] at [phone].   [facility] Management  Direct Dial: [phone]

## 2021-09-16 ENCOUNTER — Telehealth: Payer: Self-pay | Admitting: *Deleted

## 2021-09-16 NOTE — Chronic Care Management (AMB) (Signed)
°  Care Management   Note  09/16/2021 Name: [name] MRN: [id number] DOB: December 02, 1955  [name] is a 65 y.o. year old male who is a primary care patient of [name], MD and is actively engaged with the care management team. I reached out to [name] by phone today to assist with re-scheduling an initial visit with the RN Case Manager  Follow up plan: Telephone appointment with care management team member scheduled for:10/04/21  [place], [place] Management  Direct Dial: [phone]

## 2021-09-19 DIAGNOSIS — F1110 Opioid abuse, uncomplicated: Secondary | ICD-10-CM | POA: Insufficient documentation

## 2021-09-19 DIAGNOSIS — E1151 Type 2 diabetes mellitus with diabetic peripheral angiopathy without gangrene: Secondary | ICD-10-CM | POA: Insufficient documentation

## 2021-09-19 DIAGNOSIS — Z9981 Dependence on supplemental oxygen: Secondary | ICD-10-CM | POA: Insufficient documentation

## 2021-09-19 DIAGNOSIS — Z794 Long term (current) use of insulin: Secondary | ICD-10-CM | POA: Insufficient documentation

## 2021-09-21 ENCOUNTER — Telehealth (HOSPITAL_COMMUNITY): Payer: Self-pay

## 2021-09-21 NOTE — Telephone Encounter (Signed)
Spoke to [place] who agreed to home visit next week. Full paramedicine home visit scheduled for next Thursday at 3:00. Call complete.

## 2021-09-22 ENCOUNTER — Ambulatory Visit (INDEPENDENT_AMBULATORY_CARE_PROVIDER_SITE_OTHER): Payer: Commercial Managed Care - HMO | Admitting: Internal Medicine

## 2021-09-22 ENCOUNTER — Encounter: Payer: Self-pay | Admitting: Internal Medicine

## 2021-09-22 VITALS — BP 110/64 | HR 76 | Ht 71.0 in | Wt >= 6400 oz

## 2021-09-22 DIAGNOSIS — K439 Ventral hernia without obstruction or gangrene: Secondary | ICD-10-CM

## 2021-09-22 DIAGNOSIS — K8020 Calculus of gallbladder without cholecystitis without obstruction: Secondary | ICD-10-CM

## 2021-09-22 DIAGNOSIS — L304 Erythema intertrigo: Secondary | ICD-10-CM | POA: Diagnosis not present

## 2021-09-22 MED ORDER — NYSTATIN 100000 UNIT/GM EX POWD: 1.0000 "application " | Freq: Three times a day (TID) | CUTANEOUS | 5 refills | Status: DC

## 2021-09-22 NOTE — Progress Notes (Signed)
[name] 66 y.o. 1956/04/07 [id number]  Assessment & Plan:   Encounter Diagnoses  Name Primary?   Ventral hernia without obstruction or gangrene Yes   Severe obesity (BMI >= 40) (HCC)    Intertrigo    Asymptomatic gallstones    I think he should continue to exercise in the pool.  I do not think there is any restriction on that regarding the hernia.  I once again explained that I do not treat hernias i.e. they require surgery but as we know his severe obesity precludes treatment.  I explained that his gallstone is asymptomatic and nothing is necessary for this.    Nystatin powder for the intertrigo  He has an appointment with Dr. Ronnald [name] later this month and perhaps he would be a candidate for semaglutide treatment to try to lose weight.  I told him to discuss that with Dr. Ronnald [name].  Return here as needed.  With his personal history of rectal cancer he has a recall for October 2023 surveillance colonoscopy.  This will need to be done at the hospital given his severe obesity.  CC: [name] Lima, MD   Subjective:   Chief Complaint:  HPI 66 year old morbidly obese black man with a history of rectal cancer (due for surveillance colonoscopy 1023) who also has chronic idiopathic constipation problems, status post lap band bariatric surgery, and insulin-dependent diabetes.  He has persistent concerns about a ventral hernia.  He is worried that if he can exercise with this by getting into the pool and swimming.  He also is concerned because he had a CT scan showing a gallstone in the past.  He does not have any postprandial abdominal pain and really other than being sore sometimes after he exercises is not a problem.  He is also concerned about hyperpigmentation on his abdominal wall where the rolls of fat overlap.  He had a video visit about this which was unhelpful, last month.    Wt Readings from Last 3 Encounters:  09/22/21 (!) 480 lb (217.7 kg)  08/10/21 (!) 486 lb 12.8 oz  (220.8 kg)  06/10/21 (!) 445 lb (201.9 kg)    IMPRESSION: 1. No acute findings are noted in the abdomen or pelvis to account for the patient's symptoms. 2. There is an epigastric ventral hernia containing a portion of the mid transverse colon, similar to the prior study from 2021. No evidence of associated bowel incarceration or obstruction at this time. 3. Cholelithiasis without evidence of acute cholecystitis. 4. Aortic atherosclerosis. 5. Areas of cylindrical bronchiectasis with thickening of the peribronchovascular interstitium and peribronchovascular ground-glass attenuation in the lung bases bilaterally, most pronounced in the left lower lobe. These findings are similar to but progressive when compared to remote prior study, likely reflective of a chronic indolent atypical infectious process such as MAI (mycobacterium avium intracellulare). Nonemergent outpatient referral to Pulmonology for further clinical evaluation is suggested. 6. Additional incidental findings, as above.     Electronically Signed   By: [name] M.D.   On: 03/30/2021 07:11   Allergies  Allergen Reactions   Shellfish Allergy Anaphylaxis   Lisinopril Cough   Lovastatin Itching   Current Meds  Medication Sig   Accu-Chek Softclix Lancets lancets CHECK BLOOD SUGAR 2 TIMES  DAILY   Albuterol Sulfate (PROAIR RESPICLICK) 409 (90 Base) MCG/ACT AEPB Inhale 1 puff into the lungs 4 (four) times daily as needed.   aspirin EC 81 MG tablet Take 1 tablet (81 mg total) by mouth daily.  atorvastatin (LIPITOR) 40 MG tablet TAKE 1 TABLET BY MOUTH DAILY.   bisoprolol (ZEBETA) 10 MG tablet Take 1 tablet (10 mg total) by mouth every evening.   blood glucose meter kit and supplies KIT Use to check blood sugar three times daily. Dispense Freestyle Libre according to insurance preference. DX: E11.8   blood glucose meter kit and supplies Dispense based on patient and insurance preference. Use up to four times daily  as directed. (FOR ICD-10 E10.9, E11.9).   Blood Glucose Monitoring Suppl (ACCU-CHEK AVIVA CONNECT) w/Device KIT 1 Act by Does not apply route 3 (three) times daily as needed.   Blood Glucose Monitoring Suppl (ACCU-CHEK GUIDE) w/Device KIT    Cholecalciferol 50 MCG (2000 UT) TABS Take 1 tablet (2,000 Units total) by mouth daily.   Continuous Blood Gluc Receiver (FREESTYLE LIBRE 2 READER) DEVI 1 Act by Does not apply route daily.   Continuous Blood Gluc Sensor (FREESTYLE LIBRE 2 SENSOR) MISC 1 Act by Does not apply route daily.   cyanocobalamin 2000 MCG tablet Take 1 tablet (2,000 mcg total) by mouth daily.   docusate sodium (COLACE) 100 MG capsule Take 100 mg by mouth 2 (two) times daily.   ferrous sulfate 325 (65 FE) MG tablet Take 325 mg by mouth 2 (two) times daily with a meal.   furosemide (LASIX) 20 MG tablet Take 5 tablets (100 mg total) by mouth 2 (two) times daily.   glucose blood (ACCU-CHEK AVIVA PLUS) test strip Use to test blood sugar twice daily. DX E11.8   HYDROcodone-acetaminophen (NORCO) 10-325 MG tablet Take 1 tablet by mouth every 6 (six) hours as needed for moderate pain.   insulin detemir (LEVEMIR FLEXTOUCH) 100 UNIT/ML FlexPen Inject 240 Units into the skin daily.   Insulin Pen Needle (ULTICARE SHORT PEN NEEDLES) 31G X 8 MM MISC USE TWO PEN NEEDLES DAILY WITH INSULIN PENS   levothyroxine (SYNTHROID) 50 MCG tablet TAKE 1 TABLET BY MOUTH DAILY BEFORE BREAKFAST.   losartan (COZAAR) 50 MG tablet Take 1 tablet (50 mg total) by mouth daily.   Multiple Vitamins-Minerals (MULTIVITAMIN ADULT PO) Take 1 tablet by mouth daily.   potassium chloride SA (KLOR-CON M) 20 MEQ tablet TAKE 1 TABLET BY MOUTH 2 TIMES DAILY.   RESTASIS 0.05 % ophthalmic emulsion PLACE 1 DROP INTO BOTH EYES TWICE A DAY   SYNJARDY 12.01-999 MG TABS TAKE 1 TABLET BY MOUTH  TWICE DAILY   thiamine (VITAMIN B-1) 100 MG tablet Take 1 tablet (100 mg total) by mouth daily.   Past Medical History:  Diagnosis Date   Anemia     iron   CHF (congestive heart failure) ([place])    Presumed diastolic. Echo (06/07) w.EF 45%, severe posterior HK, mild LV hypertrophy, No further work-up of abnormal echo was done. pt denies CHF   Colon cancer ([place]) 2007   s/p sigmoid colectomy   Diabetes mellitus    Has Hx of diabetic foot ulcer & peripheral neuropathy  type2   Dyspnea    w/ activity   History of PFTs 05/2009   Mild Obstructive defect   HTN (hypertension)    Hyperlipidemia    Morbid obesity (HCC)    OSA on CPAP    Pneumonia    week ago    Past Surgical History:  Procedure Laterality Date   BARIATRIC SURGERY  12/2009   Lap Band/At [facility]   COLONOSCOPY  multiple   COLONOSCOPY WITH PROPOFOL N/A 11/26/2012   Procedure: COLONOSCOPY WITH PROPOFOL;  Surgeon: Gatha Mayer,  MD;  Location: [facility];  Service: Endoscopy;  Laterality: N/A;  may need pre appt. with anesthesia due to morbid obesity   COLONOSCOPY WITH PROPOFOL N/A 07/11/2017   Procedure: COLONOSCOPY WITH PROPOFOL;  Surgeon: Gatha Mayer, MD;  Location: [facility];  Service: Endoscopy;  Laterality: N/A;   [place]   '80/Right  '84/Left   EYE SURGERY Right 05/2017   HERNIA REPAIR     HIP SURGERY     bilateral   INCISIONAL HERNIA REPAIR N/A 12/18/2013   Procedure:  REPAIR OF INCARCERATED INCISIONAL HERNIA;  Surgeon: Rolm Bookbinder, MD;  Location: [facility];  Service: General;  Laterality: N/A;   INSERTION OF MESH N/A 12/18/2013   Procedure: INSERTION OF MESH;  Surgeon: Rolm Bookbinder, MD;  Location: [facility];  Service: General;  Laterality: N/A;   Sigmoid Colectomy  10/2005   [place]   TONSILLECTOMY     Social History   Social History Narrative   HSG, 2 years of [place]   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   In new relationship (04/2009) - girlfriend helps taste  for cooking business and helps monitor for ulcers.   Regular Exercise- No      family history includes CAD in his mother; Congestive Heart Failure in his mother; Diabetes in his father and mother; Heart attack (age of onset: 81) in his brother; Heart failure in his brother; Hepatitis in his mother.   Review of Systems As per HPI  Objective:   Physical Exam BP 110/64 (BP Location: Left Wrist, Patient Position: Sitting, Cuff Size: Normal)    Pulse 76    Ht _0  (1.803 m) Comment: from a previous height   Wt (!) 480 lb (217.7 kg) Comment: per pt   BMI 66.95 kg/m  Severely obese black man in a motorized wheelchair The abdomen is very obese it is nontender there are some subcutaneous nodules and there is an upper abdominal ventral hernia that is soft in the mid abdomen where the rolls of skin and fat overlap there is hyperpigmentation but no skin breakdown.

## 2021-09-22 NOTE — Patient Instructions (Signed)
We have sent the following medications to your pharmacy for you to pick up at your convenience: Nystatin powder for the rash on your abdomin.  Ask Dr [name] about the possibility of you using ozempic for weight loss.   I appreciate the opportunity to care for you. Silvano Rusk, MD, [facility]

## 2021-09-28 ENCOUNTER — Observation Stay (HOSPITAL_COMMUNITY)
Admission: EM | Admit: 2021-09-28 | Discharge: 2021-09-29 | Disposition: A | Discharge status: Home / Self Care | Payer: Medicare Other | Attending: Internal Medicine | Admitting: Internal Medicine

## 2021-09-28 ENCOUNTER — Encounter (HOSPITAL_COMMUNITY): Payer: Self-pay

## 2021-09-28 ENCOUNTER — Telehealth: Payer: Self-pay | Admitting: Internal Medicine

## 2021-09-28 ENCOUNTER — Other Ambulatory Visit: Payer: Self-pay

## 2021-09-28 ENCOUNTER — Emergency Department (HOSPITAL_COMMUNITY): Payer: Medicare Other

## 2021-09-28 DIAGNOSIS — N179 Acute kidney failure, unspecified: Secondary | ICD-10-CM

## 2021-09-28 DIAGNOSIS — R109 Unspecified abdominal pain: Secondary | ICD-10-CM | POA: Diagnosis present

## 2021-09-28 DIAGNOSIS — E875 Hyperkalemia: Secondary | ICD-10-CM | POA: Diagnosis not present

## 2021-09-28 DIAGNOSIS — E119 Type 2 diabetes mellitus without complications: Secondary | ICD-10-CM

## 2021-09-28 DIAGNOSIS — Z20822 Contact with and (suspected) exposure to covid-19: Secondary | ICD-10-CM | POA: Diagnosis not present

## 2021-09-28 DIAGNOSIS — Z85038 Personal history of other malignant neoplasm of large intestine: Secondary | ICD-10-CM | POA: Insufficient documentation

## 2021-09-28 DIAGNOSIS — E559 Vitamin D deficiency, unspecified: Secondary | ICD-10-CM

## 2021-09-28 DIAGNOSIS — E1122 Type 2 diabetes mellitus with diabetic chronic kidney disease: Secondary | ICD-10-CM | POA: Insufficient documentation

## 2021-09-28 DIAGNOSIS — E039 Hypothyroidism, unspecified: Secondary | ICD-10-CM | POA: Insufficient documentation

## 2021-09-28 DIAGNOSIS — I130 Hypertensive heart and chronic kidney disease with heart failure and stage 1 through stage 4 chronic kidney disease, or unspecified chronic kidney disease: Secondary | ICD-10-CM | POA: Diagnosis not present

## 2021-09-28 DIAGNOSIS — K5900 Constipation, unspecified: Secondary | ICD-10-CM | POA: Insufficient documentation

## 2021-09-28 DIAGNOSIS — I10 Essential (primary) hypertension: Secondary | ICD-10-CM | POA: Diagnosis present

## 2021-09-28 DIAGNOSIS — Z743 Need for continuous supervision: Secondary | ICD-10-CM | POA: Diagnosis not present

## 2021-09-28 DIAGNOSIS — Z794 Long term (current) use of insulin: Secondary | ICD-10-CM | POA: Diagnosis not present

## 2021-09-28 DIAGNOSIS — Z7982 Long term (current) use of aspirin: Secondary | ICD-10-CM | POA: Diagnosis not present

## 2021-09-28 DIAGNOSIS — K8020 Calculus of gallbladder without cholecystitis without obstruction: Secondary | ICD-10-CM

## 2021-09-28 DIAGNOSIS — Z9049 Acquired absence of other specified parts of digestive tract: Secondary | ICD-10-CM | POA: Diagnosis not present

## 2021-09-28 DIAGNOSIS — K56600 Partial intestinal obstruction, unspecified as to cause: Secondary | ICD-10-CM | POA: Diagnosis not present

## 2021-09-28 DIAGNOSIS — I5032 Chronic diastolic (congestive) heart failure: Secondary | ICD-10-CM | POA: Diagnosis present

## 2021-09-28 DIAGNOSIS — E118 Type 2 diabetes mellitus with unspecified complications: Secondary | ICD-10-CM

## 2021-09-28 DIAGNOSIS — I700 Atherosclerosis of aorta: Secondary | ICD-10-CM | POA: Diagnosis not present

## 2021-09-28 DIAGNOSIS — N1832 Chronic kidney disease, stage 3b: Secondary | ICD-10-CM | POA: Diagnosis not present

## 2021-09-28 DIAGNOSIS — Z6841 Body Mass Index (BMI) 40.0 and over, adult: Secondary | ICD-10-CM

## 2021-09-28 DIAGNOSIS — E662 Morbid (severe) obesity with alveolar hypoventilation: Secondary | ICD-10-CM

## 2021-09-28 DIAGNOSIS — Z7984 Long term (current) use of oral hypoglycemic drugs: Secondary | ICD-10-CM | POA: Insufficient documentation

## 2021-09-28 DIAGNOSIS — E519 Thiamine deficiency, unspecified: Secondary | ICD-10-CM

## 2021-09-28 DIAGNOSIS — N189 Chronic kidney disease, unspecified: Secondary | ICD-10-CM

## 2021-09-28 DIAGNOSIS — I739 Peripheral vascular disease, unspecified: Secondary | ICD-10-CM

## 2021-09-28 DIAGNOSIS — Z79899 Other long term (current) drug therapy: Secondary | ICD-10-CM | POA: Insufficient documentation

## 2021-09-28 DIAGNOSIS — R110 Nausea: Secondary | ICD-10-CM | POA: Diagnosis not present

## 2021-09-28 DIAGNOSIS — Z9884 Bariatric surgery status: Secondary | ICD-10-CM

## 2021-09-28 DIAGNOSIS — R1111 Vomiting without nausea: Secondary | ICD-10-CM | POA: Diagnosis not present

## 2021-09-28 DIAGNOSIS — K5903 Drug induced constipation: Secondary | ICD-10-CM

## 2021-09-28 DIAGNOSIS — E083593 Diabetes mellitus due to underlying condition with proliferative diabetic retinopathy without macular edema, bilateral: Secondary | ICD-10-CM

## 2021-09-28 DIAGNOSIS — D508 Other iron deficiency anemias: Secondary | ICD-10-CM

## 2021-09-28 LAB — COMPREHENSIVE METABOLIC PANEL
ALT: 20 U/L (ref 0–44)
AST: 25 U/L (ref 15–41)
Albumin: 3.9 g/dL (ref 3.5–5.0)
Alkaline Phosphatase: 91 U/L (ref 38–126)
Anion gap: 11 (ref 5–15)
BUN: 39 mg/dL — ABNORMAL HIGH (ref 8–23)
CO2: 32 mmol/L (ref 22–32)
Calcium: 9.6 mg/dL (ref 8.9–10.3)
Chloride: 96 mmol/L — ABNORMAL LOW (ref 98–111)
Creatinine, Ser: 2.21 mg/dL — ABNORMAL HIGH (ref 0.61–1.24)
GFR, Estimated: 33 mL/min — ABNORMAL LOW (ref >60)
Glucose, Bld: 171 mg/dL — ABNORMAL HIGH (ref 70–99)
Potassium: 5.8 mmol/L — ABNORMAL HIGH (ref 3.5–5.1)
Sodium: 139 mmol/L (ref 135–145)
Total Bilirubin: 0.6 mg/dL (ref 0.3–1.2)
Total Protein: 9.3 g/dL — ABNORMAL HIGH (ref 6.5–8.1)

## 2021-09-28 LAB — CBC WITH DIFFERENTIAL/PLATELET
Abs Immature Granulocytes: 0.03 10*3/uL (ref 0.00–0.07)
Basophils Absolute: 0 10*3/uL (ref 0.0–0.1)
Basophils Relative: 0 %
Eosinophils Absolute: 0 10*3/uL (ref 0.0–0.5)
Eosinophils Relative: 0 %
HCT: 42.6 % (ref 39.0–52.0)
Hemoglobin: 12.6 g/dL — ABNORMAL LOW (ref 13.0–17.0)
Immature Granulocytes: 0 %
Lymphocytes Relative: 9 %
Lymphs Abs: 0.9 10*3/uL (ref 0.7–4.0)
MCH: 29.2 pg (ref 26.0–34.0)
MCHC: 29.6 g/dL — ABNORMAL LOW (ref 30.0–36.0)
MCV: 98.6 fL (ref 80.0–100.0)
Monocytes Absolute: 0.3 10*3/uL (ref 0.1–1.0)
Monocytes Relative: 4 %
Neutro Abs: 8 10*3/uL — ABNORMAL HIGH (ref 1.7–7.7)
Neutrophils Relative %: 87 %
Platelets: 263 10*3/uL (ref 150–400)
RBC: 4.32 MIL/uL (ref 4.22–5.81)
RDW: 16.4 % — ABNORMAL HIGH (ref 11.5–15.5)
WBC: 9.2 10*3/uL (ref 4.0–10.5)
nRBC: 0 % (ref 0.0–0.2)

## 2021-09-28 LAB — RESP PANEL BY RT-PCR (FLU A&B, COVID) ARPGX2
Influenza A by PCR: NEGATIVE
Influenza B by PCR: NEGATIVE
SARS Coronavirus 2 by RT PCR: NEGATIVE

## 2021-09-28 LAB — URINALYSIS, ROUTINE W REFLEX MICROSCOPIC
Bacteria, UA: NONE SEEN
Bilirubin Urine: NEGATIVE
Glucose, UA: >=500 mg/dL — AB
Hgb urine dipstick: NEGATIVE
Ketones, ur: NEGATIVE mg/dL
Leukocytes,Ua: NEGATIVE
Nitrite: NEGATIVE
Protein, ur: NEGATIVE mg/dL
Specific Gravity, Urine: 1.011 (ref 1.005–1.030)
pH: 5 (ref 5.0–8.0)

## 2021-09-28 LAB — CBG MONITORING, ED: Glucose-Capillary: 189 mg/dL — ABNORMAL HIGH (ref 70–99)

## 2021-09-28 LAB — LIPASE, BLOOD: Lipase: 34 U/L (ref 11–51)

## 2021-09-28 MED ORDER — ACETAMINOPHEN 650 MG RE SUPP: 650.0000 mg | Freq: Four times a day (QID) | RECTAL | Status: DC | PRN

## 2021-09-28 MED ORDER — CYCLOSPORINE 0.05 % OP EMUL
1.0000 [drp] | Freq: Two times a day (BID) | OPHTHALMIC | Status: DC
  Administered 2021-09-29 (×2): 1 [drp] via OPHTHALMIC
  Filled 2021-09-28 (×3): qty 30

## 2021-09-28 MED ORDER — LEVOTHYROXINE SODIUM 50 MCG PO TABS
50.0000 ug | ORAL_TABLET | Freq: Every day | ORAL | Status: DC
  Administered 2021-09-29: 50 ug via ORAL
  Filled 2021-09-28: qty 1

## 2021-09-28 MED ORDER — BISOPROLOL FUMARATE 5 MG PO TABS
10.0000 mg | ORAL_TABLET | Freq: Every evening | ORAL | Status: DC
  Administered 2021-09-29: 10 mg via ORAL
  Filled 2021-09-28 (×2): qty 2

## 2021-09-28 MED ORDER — ENOXAPARIN SODIUM 40 MG/0.4ML IJ SOSY: 40.0000 mg | PREFILLED_SYRINGE | Freq: Two times a day (BID) | INTRAMUSCULAR | Status: DC

## 2021-09-28 MED ORDER — ACETAMINOPHEN 325 MG PO TABS
650.0000 mg | ORAL_TABLET | Freq: Four times a day (QID) | ORAL | Status: DC | PRN
  Administered 2021-09-29: 650 mg via ORAL
  Filled 2021-09-28: qty 2

## 2021-09-28 MED ORDER — ATORVASTATIN CALCIUM 40 MG PO TABS
40.0000 mg | ORAL_TABLET | Freq: Every day | ORAL | Status: DC
  Administered 2021-09-29: 40 mg via ORAL
  Filled 2021-09-28: qty 1

## 2021-09-28 MED ORDER — SODIUM CHLORIDE 0.9 % IV SOLN: INTRAVENOUS | Status: AC

## 2021-09-28 MED ORDER — INSULIN ASPART 100 UNIT/ML IJ SOLN
0.0000 [IU] | INTRAMUSCULAR | Status: DC
  Administered 2021-09-29 (×2): 1 [IU] via SUBCUTANEOUS
  Administered 2021-09-29: 2 [IU] via SUBCUTANEOUS
  Filled 2021-09-28: qty 0.09

## 2021-09-28 MED ORDER — SODIUM CHLORIDE 0.9 % IV BOLUS
1000.0000 mL | Freq: Once | INTRAVENOUS | Status: AC
  Administered 2021-09-28: 1000 mL via INTRAVENOUS

## 2021-09-28 MED ORDER — ONDANSETRON 4 MG PO TBDP
4.0000 mg | ORAL_TABLET | Freq: Once | ORAL | Status: DC
  Filled 2021-09-28: qty 1

## 2021-09-28 MED ORDER — INSULIN ASPART 100 UNIT/ML IJ SOLN
5.0000 [IU] | Freq: Once | INTRAMUSCULAR | Status: AC
  Administered 2021-09-28: 5 [IU] via SUBCUTANEOUS
  Filled 2021-09-28: qty 0.05

## 2021-09-28 NOTE — ED Provider Triage Note (Signed)
Emergency Medicine Provider Triage Evaluation Note  [name] , a 66 y.o. male  was evaluated in triage.  Pt complains of abdominal pain, nausea, vomiting, and constipation.  Patient reports that his symptoms have been present over the last 24 hours.  Patient complains of pain around his ventral hernia site.  States that he has had 4 episodes of vomiting.  Denies any hematemesis or coffee-ground emesis.  Patient states that he has not had a bowel movement in last 24 hours.  Patient states that he is also having pain near her rectum when attempting to have bowel movement.  Review of Systems  Positive: Abdominal pain, nausea, vomiting, constipation Negative: Fever, chills, blood in stool, melena, hematemesis, coffee-ground emesis, dysuria, hematuria, urinary urgency  Physical Exam  BP (!) 156/76 (BP Location: Right Arm)    Pulse 65    Temp 97.6 F (36.4 C) (Oral)    Resp 18    Ht 6\' 1"  (1.854 m)    Wt (!) 217.7 kg    SpO2 95%    BMI 63.33 kg/m  Gen:   Awake, no distress   Resp:  Normal effort  MSK:   Moves extremities without difficulty  Other:  Abdomen protuberant, soft, nondistended, tenderness near ventral hernia.  Unable to reduce hernia at this time however patient is sitting upright in exam chair  Medical Decision Making  Medically screening exam initiated at 12:52 PM.  Appropriate orders placed.  Quentavious Rittenhouse was informed that the remainder of the evaluation will be completed by another provider, this initial triage assessment does not replace that evaluation, and the importance of remaining in the ED until their evaluation is complete.     Loni Beckwith, [place] 09/28/21 1254

## 2021-09-28 NOTE — ED Notes (Signed)
Pt given juice and [place] sandwich per PO challenge.

## 2021-09-28 NOTE — Telephone Encounter (Signed)
Patient states he has been vomiting and constipated since 09-27-2021  Patient states he has a hernia  Patient requested to speak w/ a triage nurse  Patient transferred to team health

## 2021-09-28 NOTE — ED Triage Notes (Signed)
Per EMS- Patient ca/o abdominal pain, N/v since this AM. Patient states he vomited x 3 today. Patient also reports an abdominal hernia that causes intermittent pain.

## 2021-09-28 NOTE — Telephone Encounter (Signed)
Triage nurse has called and states that pt wants to speak with provider's assistant regarding medication for his constipation.    Please give pt a call.

## 2021-09-28 NOTE — ED Provider Notes (Signed)
[facility] DEPT Provider Note   CSN: [id number] Arrival date & time: 09/28/21  1221     History  Chief Complaint  Patient presents with   Abdominal Pain   Emesis    [name] is a 66 y.o. male.  Patient is a 66 year old male who presents with abdominal pain.  Per chart review, he had a sigmoid colectomy in 2007 for colon cancer.  He also had a Lap-Band surgery in 2011.  He was admitted in 2015 for small bowel obstruction related to incarcerated incisional hernia.  He had an open hernia repair at that time.  He reports abdominal pain for the last 2 days with increasing abdominal distention.  He started having some vomiting this morning.  He has vomited 3 times today.  He reports constipation with his last bowel movement 2 days ago.  He does say that he took a Dulcolax while he was waiting to be seen and had a large bowel movement here in the emergency department.  He feels better.  His abdominal distention has improved.  He still having some abdominal pain.  No known fevers.  No urinary symptoms.      Home Medications Prior to Admission medications   Medication Sig Start Date End Date Taking? Authorizing Provider  Accu-Chek Softclix Lancets lancets CHECK BLOOD SUGAR 2 TIMES  DAILY 10/21/20   [name], MD  Albuterol Sulfate (PROAIR RESPICLICK) 409 (90 Base) MCG/ACT AEPB Inhale 1 puff into the lungs 4 (four) times daily as needed. 02/05/21   [name], MD  aspirin EC 81 MG tablet Take 1 tablet (81 mg total) by mouth daily. 04/17/20   [name], MD  atorvastatin (LIPITOR) 40 MG tablet TAKE 1 TABLET BY MOUTH DAILY. 03/15/21   [name], MD  bisoprolol (ZEBETA) 10 MG tablet Take 1 tablet (10 mg total) by mouth every evening. 04/19/21   [name], MD  blood glucose meter kit and supplies KIT Use to check blood sugar three times daily. Dispense Freestyle Libre according to insurance preference. DX: E11.8 10/21/20   [name], MD   blood glucose meter kit and supplies Dispense based on patient and insurance preference. Use up to four times daily as directed. (FOR ICD-10 E10.9, E11.9). 06/22/21   [name], MD  Blood Glucose Monitoring Suppl (ACCU-CHEK AVIVA CONNECT) w/Device KIT 1 Act by Does not apply route 3 (three) times daily as needed. 11/09/20   [name], MD  Blood Glucose Monitoring Suppl (ACCU-CHEK GUIDE) w/Device KIT  11/12/20   [provider]  Cholecalciferol 50 MCG (2000 UT) TABS Take 1 tablet (2,000 Units total) by mouth daily. 04/17/20   [name], MD  Continuous Blood Gluc Receiver (FREESTYLE LIBRE 2 READER) DEVI 1 Act by Does not apply route daily. 07/27/21   [name], MD  Continuous Blood Gluc Sensor (FREESTYLE LIBRE 2 SENSOR) MISC 1 Act by Does not apply route daily. 07/27/21   [name], MD  cyanocobalamin 2000 MCG tablet Take 1 tablet (2,000 mcg total) by mouth daily. 04/17/20   [name], MD  docusate sodium (COLACE) 100 MG capsule Take 100 mg by mouth 2 (two) times daily.    [provider]  ferrous sulfate 325 (65 FE) MG tablet Take 325 mg by mouth 2 (two) times daily with a meal.    [provider]  furosemide (LASIX) 20 MG tablet Take 5 tablets (100 mg total) by  mouth 2 (two) times daily. 08/10/21   [place], Maricela Bo, FNP  Glucagon (GVOKE HYPOPEN 2-PACK) 1 MG/0.2ML SOAJ Inject 1 Act into the skin daily as needed. Patient not taking: Reported on 09/01/2021 07/23/20   [name], MD  glucose blood (ACCU-CHEK AVIVA PLUS) test strip Use to test blood sugar twice daily. DX E11.8 10/21/20   [name], MD  HYDROcodone-acetaminophen (NORCO) 10-325 MG tablet Take 1 tablet by mouth every 6 (six) hours as needed for moderate pain. 09/07/21   [name], MD  insulin detemir (LEVEMIR FLEXTOUCH) 100 UNIT/ML FlexPen Inject 240 Units into the skin daily. 04/19/21   [name], MD  Insulin Pen Needle (ULTICARE SHORT PEN NEEDLES) 31G X 8 MM MISC  USE TWO PEN NEEDLES DAILY WITH INSULIN PENS 07/19/21   [name], MD  levothyroxine (SYNTHROID) 50 MCG tablet TAKE 1 TABLET BY MOUTH DAILY BEFORE BREAKFAST. 05/25/21   [name], MD  losartan (COZAAR) 50 MG tablet Take 1 tablet (50 mg total) by mouth daily. 07/24/20   [name], MD  Multiple Vitamins-Minerals (MULTIVITAMIN ADULT PO) Take 1 tablet by mouth daily.    [provider]  nystatin (MYCOSTATIN/NYSTOP) powder Apply 1 application topically 3 (three) times daily. 09/22/21   Gatha Mayer, MD  potassium chloride SA (KLOR-CON M) 20 MEQ tablet TAKE 1 TABLET BY MOUTH 2 TIMES DAILY. 09/01/21   Bensimhon, Shaune Pascal, MD  RESTASIS 0.05 % ophthalmic emulsion PLACE 1 DROP INTO BOTH EYES TWICE A DAY 05/07/21   [name], MD  SYNJARDY 12.01-999 MG TABS TAKE 1 TABLET BY MOUTH  TWICE DAILY 01/10/21   [name], MD  thiamine (VITAMIN B-1) 100 MG tablet Take 1 tablet (100 mg total) by mouth daily. 04/17/20   [name], MD      Allergies    Shellfish allergy, Lisinopril, and Lovastatin    Review of Systems   Review of Systems  Constitutional:  Negative for chills, diaphoresis, fatigue and fever.  HENT:  Negative for congestion, rhinorrhea and sneezing.   Eyes: Negative.   Respiratory:  Negative for cough, chest tightness and shortness of breath.   Cardiovascular:  Negative for chest pain and leg swelling.  Gastrointestinal:  Positive for abdominal distention, abdominal pain, constipation, nausea and vomiting. Negative for blood in stool and diarrhea.  Genitourinary:  Negative for difficulty urinating, flank pain, frequency and hematuria.  Musculoskeletal:  Negative for arthralgias and back pain.  Skin:  Negative for rash.  Neurological:  Negative for dizziness, speech difficulty, weakness, numbness and headaches.   Physical Exam Updated Vital Signs BP 121/75 (BP Location: Left Arm)    Pulse 71    Temp 97.6 F (36.4 C) (Oral)    Resp (!) 22    Ht _0  (1.854  m)    Wt (!) 217.7 kg    SpO2 95%    BMI 63.33 kg/m  Physical Exam Constitutional:      Appearance: He is well-developed. He is obese.  HENT:     Head: Normocephalic and atraumatic.  Eyes:     Pupils: Pupils are equal, round, and reactive to light.  Cardiovascular:     Rate and Rhythm: Normal rate and regular rhythm.     Heart sounds: Normal heart sounds.  Pulmonary:     Effort: Pulmonary effort is normal. No respiratory distress.     Breath sounds: Normal breath sounds. No wheezing or rales.  Chest:  Chest wall: No tenderness.  Abdominal:     General: Bowel sounds are normal.     Palpations: Abdomen is soft.     Tenderness: There is no abdominal tenderness. There is no guarding or rebound.     Comments: Patient has a ventral hernia.  There is bowel protruding but it is easily reducible although it slides back and forth.  Its not tender.  He does have some scar tissue to his right lower abdomen.  He has some tenderness around this area.  Musculoskeletal:        General: Normal range of motion.     Cervical back: Normal range of motion and neck supple.  Lymphadenopathy:     Cervical: No cervical adenopathy.  Skin:    General: Skin is warm and dry.     Findings: No rash.  Neurological:     Mental Status: He is alert and oriented to person, place, and time.    ED Results / Procedures / Treatments   Labs (all labs ordered are listed, but only abnormal results are displayed) Labs Reviewed  COMPREHENSIVE METABOLIC PANEL - Abnormal; Notable for the following components:      Result Value   Potassium 5.8 (*)    Chloride 96 (*)    Glucose, Bld 171 (*)    BUN 39 (*)    Creatinine, Ser 2.21 (*)    Total Protein 9.3 (*)    GFR, Estimated 33 (*)    All other components within normal limits  CBC WITH DIFFERENTIAL/PLATELET - Abnormal; Notable for the following components:   Hemoglobin 12.6 (*)    MCHC 29.6 (*)    RDW 16.4 (*)    Neutro Abs 8.0 (*)    All other components  within normal limits  URINALYSIS, ROUTINE W REFLEX MICROSCOPIC - Abnormal; Notable for the following components:   Glucose, UA >=500 (*)    All other components within normal limits  LIPASE, BLOOD    EKG None  Radiology CT Abdomen Pelvis Wo Contrast  Result Date: 09/28/2021 CLINICAL DATA:  Abdominal pain common nausea, vomiting and constipation times 24 hours. Pain along ventral hernia repair site. EXAM: CT ABDOMEN AND PELVIS WITHOUT CONTRAST TECHNIQUE: Multidetector CT imaging of the abdomen and pelvis was performed following the standard protocol without IV contrast. RADIATION DOSE REDUCTION: This exam was performed according to the departmental dose-optimization program which includes automated exposure control, adjustment of the mA and/or kV according to patient size and/or use of iterative reconstruction technique. COMPARISON:  CT March 30, 2021. FINDINGS: Lower chest: Chronic areas of cylindrical bronchiectasis with thickening of the peribronchovascular interstitium and mild peribronchovascular ground-glass most pronounced in the left lower lobe, appears similar prior Hepatobiliary: Unremarkable noncontrast appearance of the hepatic parenchyma. Cholelithiasis without findings of acute cholecystitis. No biliary ductal dilation. Pancreas: No pancreatic ductal dilation or evidence of acute inflammation. Spleen: Within normal limits. Adrenals/Urinary Tract: Bilateral adrenal glands are unremarkable. No hydronephrosis. No renal, ureteral or bladder calculi identified. Urinary bladder is unremarkable for degree of distension. Stomach/Bowel: No enteric contrast was administered. Unchanged orientation of the gastric band with reservoir in the right paramedian anterior abdominal wall. There is similar fluid collecting about the reservoir and extending along the tubing into the abdomen. Prominent gas and fluid-filled loops of small bowel without pathologic dilation or abrupt transition point. Fluid-filled  proximal colon with relatively decompressed sigmoid and descending colon, suggestive of diarrheal illness. Fat and nonobstructed mid transverse colon containing ventral hernia appears similar to prior with  a 3.2 cm aperture width. Vascular/Lymphatic: Aortic and branch vessel atherosclerosis without abdominal aortic aneurysm. No pathologically enlarged abdominal or pelvic lymph nodes. Reproductive: Prostate is unremarkable. Other: Cutaneous skin thickening with subcutaneous stranding/fluid along the anterior abdominal wall, are similar prior. Similar skin thickening and granulation tissue along the prior midline ventral incision. Musculoskeletal: Bilateral proximal femoral fixation hardware. Severe right hip degenerative change is unchanged from prior. No acute osseous abnormality visualized. IMPRESSION: 1. Prominent gas and fluid-filled loops of small bowel without pathologic dilation or abrupt transition, favored to reflect enteritis although early/partial obstruction not excluded. 2. Fluid-filled proximal colon with relatively decompressed sigmoid and descending colon, suggestive of diarrheal illness. 3. Similar appearance of the ventral hernia containing fat and a nonobstructed portion of transverse colon, recommend correlation clinically for reducibility. 4. Cholelithiasis without findings of acute cholecystitis. 5. Chronic areas of cylindrical bronchiectasis with thickening of the peribronchovascular interstitium and mild peribronchovascular ground-glass most pronounced in the left lower lobe, appears similar prior. 6. Aortic Atherosclerosis (ICD10-I70.0). Electronically Signed   By: [name] M.D.   On: 09/28/2021 18:13    Procedures Procedures    Medications Ordered in ED Medications  ondansetron (ZOFRAN-ODT) disintegrating tablet 4 mg (4 mg Oral Patient Refused/Not Given 09/28/21 1648)  sodium chloride 0.9 % bolus 1,000 mL (1,000 mLs Intravenous New Bag/Given 09/28/21 1910)    ED Course/  Medical Decision Making/ A&P                           Medical Decision Making Amount and/or Complexity of Data Reviewed External Data Reviewed: labs, radiology and notes. Labs: ordered. Decision-making details documented in ED Course. Radiology: ordered and independent interpretation performed. Decision-making details documented in ED Course.  Risk Decision regarding hospitalization.   Patient is a 66 year old male who presents with abdominal pain, distention and vomiting.  He took a Dulcolax and had a bowel movement in the emergency department prior to my evaluation.  He was feeling better after this but still had some abdominal pain.  He does have a ventral hernia that is easily reducible and nontender on my exam.  CT scan was performed given his ongoing mild abdominal pain.  This shows a possible early small bowel obstruction versus enteritis.  Initially the patient seemed to be feeling better and we did a p.o. trial.  He said his pain increase.  He does not have any nausea or vomiting.  Given this, I feel that is reasonable to admit the patient for observation.  I reviewed his labs.  His potassium is slightly elevated and his creatinine is elevated as compared to prior values.  He was given a liter of IV fluids.  I spoke with Dr. Marlowe Sax who will admit the patient for further treatment.  Final Clinical Impression(s) / ED Diagnoses Final diagnoses:  Partial intestinal obstruction, unspecified cause ([place])  Hyperkalemia    Rx / DC Orders ED Discharge Orders     None         Malvin Johns, MD 09/28/21 2104

## 2021-09-28 NOTE — Telephone Encounter (Signed)
Called pt, LVM to discuss constipation issues.

## 2021-09-28 NOTE — H&P (Signed)
History and Physical    [name] BVQ:[id number] DOB: 11-04-1955 DOA: 09/28/2021  PCP: [name], MD Patient coming from: Home  Chief Complaint: Abdominal pain  HPI: [name] is a 66 y.o. male with medical history significant of colon cancer status post sigmoid colectomy in 2007, ventral hernia, morbid obesity status post lap band in 2011 with current BMI 63.33, cholelithiasis, insulin-dependent type 2 diabetes, chronic diastolic CHF, hypertension, OSA, CKD stage III, hypothyroidism, BPH presented to the ED complaining of abdominal pain, nausea, vomiting, and constipation.  Ventral hernia reducible on exam.  Not febrile.  Labs showing no leukocytosis.  Hemoglobin 12.6, stable.  Potassium 5.8.  BUN 39, creatinine 2.2 (baseline 1.8).  LFTs normal.  Lipase normal.  UA without signs of infection.  CT showing findings suspicious for enteritis versus early/partial SBO, stable ventral hernia. Patient had a large bowel movement in the emergency room after which his abdominal pain improved and he was able to tolerate p.o. intake.  However, abdominal pain recurred and admission requested for observation.  Patient received 1 L normal saline bolus in the ED.  Patient reports 1 day history of abdominal pain and distention.  Reports multiple episodes of vomiting since yesterday.  He has been constipated with last bowel movement 5 days ago and taking Dulcolax at home.  Patient states while in the ED waiting room, he took another Dulcolax and after a few hours had a "giant" bowel movement and was on the toilet for "20 minutes."  States as soon as he had a bowel movement, his abdominal pain improved drastically and he stopped vomiting.  States he was able to eat "a bite of a sandwich" and drink some apple juice in the ED and has not vomited since then.  Still having some mild abdominal discomfort but significantly improved since after he had a bowel movement.  Review of Systems:  All systems reviewed and  apart from history of presenting illness, are negative.  Past Medical History:  Diagnosis Date   Anemia    iron   CHF (congestive heart failure) ([place])    Presumed diastolic. Echo (06/07) w.EF 45%, severe posterior HK, mild LV hypertrophy, No further work-up of abnormal echo was done. pt denies CHF   Colon cancer ([place]) 2007   s/p sigmoid colectomy   Diabetes mellitus    Has Hx of diabetic foot ulcer & peripheral neuropathy  type2   Dyspnea    w/ activity   History of PFTs 05/2009   Mild Obstructive defect   HTN (hypertension)    Hyperlipidemia    Morbid obesity (HCC)    OSA on CPAP    Pneumonia    week ago     Past Surgical History:  Procedure Laterality Date   BARIATRIC SURGERY  12/2009   Lap Band/At [facility]   COLONOSCOPY  multiple   COLONOSCOPY WITH PROPOFOL N/A 11/26/2012   Procedure: COLONOSCOPY WITH PROPOFOL;  Surgeon: Gatha Mayer, MD;  Location: [facility];  Service: Endoscopy;  Laterality: N/A;  may need pre appt. with anesthesia due to morbid obesity   COLONOSCOPY WITH PROPOFOL N/A 07/11/2017   Procedure: COLONOSCOPY WITH PROPOFOL;  Surgeon: Gatha Mayer, MD;  Location: [facility];  Service: Endoscopy;  Laterality: N/A;   [place]   '80/Right  '84/Left   EYE SURGERY Right 05/2017   HERNIA REPAIR     HIP SURGERY     bilateral   INCISIONAL HERNIA REPAIR N/A 12/18/2013   Procedure:  REPAIR OF INCARCERATED INCISIONAL HERNIA;  Surgeon: Rolm Bookbinder, MD;  Location: [place];  Service: General;  Laterality: N/A;   INSERTION OF MESH N/A 12/18/2013   Procedure: INSERTION OF MESH;  Surgeon: Rolm Bookbinder, MD;  Location: [place];  Service: General;  Laterality: N/A;   Sigmoid Colectomy  10/2005   [place]   TONSILLECTOMY       reports that he has never smoked. He has never used smokeless tobacco. He reports that he does not currently use alcohol. He reports that he does not use drugs.  Allergies  Allergen Reactions   Shellfish Allergy  Anaphylaxis   Lisinopril Cough   Lovastatin Itching    Family History  Problem Relation Age of Onset   Hepatitis Mother        hepatitis C   Diabetes Mother    Congestive Heart Failure Mother    CAD Mother    Heart attack Brother 73   Diabetes Father    Heart failure Brother    Stomach cancer Neg Hx    Colon cancer Neg Hx     Prior to Admission medications   Medication Sig Start Date End Date Taking? Authorizing Provider  Accu-Chek Softclix Lancets lancets CHECK BLOOD SUGAR 2 TIMES  DAILY 10/21/20   [name], MD  Albuterol Sulfate (PROAIR RESPICLICK) 856 (90 Base) MCG/ACT AEPB Inhale 1 puff into the lungs 4 (four) times daily as needed. 02/05/21   [name], MD  aspirin EC 81 MG tablet Take 1 tablet (81 mg total) by mouth daily. 04/17/20   [name], MD  atorvastatin (LIPITOR) 40 MG tablet TAKE 1 TABLET BY MOUTH DAILY. 03/15/21   [name], MD  bisoprolol (ZEBETA) 10 MG tablet Take 1 tablet (10 mg total) by mouth every evening. 04/19/21   [name], MD  blood glucose meter kit and supplies KIT Use to check blood sugar three times daily. Dispense Freestyle Libre according to insurance preference. DX: E11.8 10/21/20   [name], MD  blood glucose meter kit and supplies Dispense based on patient and insurance preference. Use up to four times daily as directed. (FOR ICD-10 E10.9, E11.9). 06/22/21   [name], MD  Blood Glucose Monitoring Suppl (ACCU-CHEK AVIVA CONNECT) w/Device KIT 1 Act by Does not apply route 3 (three) times daily as needed. 11/09/20   [name], MD  Blood Glucose Monitoring Suppl (ACCU-CHEK GUIDE) w/Device KIT  11/12/20   [provider]  Cholecalciferol 50 MCG (2000 UT) TABS Take 1 tablet (2,000 Units total) by mouth daily. 04/17/20   [name], MD  Continuous Blood Gluc Receiver (FREESTYLE LIBRE 2 READER) DEVI 1 Act by Does not apply route daily. 07/27/21   [name], MD  Continuous Blood Gluc Sensor (FREESTYLE  LIBRE 2 SENSOR) MISC 1 Act by Does not apply route daily. 07/27/21   [name], MD  cyanocobalamin 2000 MCG tablet Take 1 tablet (2,000 mcg total) by mouth daily. 04/17/20   [name], MD  docusate sodium (COLACE) 100 MG capsule Take 100 mg by mouth 2 (two) times daily.    [provider]  ferrous sulfate 325 (65 FE) MG tablet Take 325 mg by mouth 2 (two) times daily with a meal.    [provider]  furosemide (LASIX) 20 MG tablet Take 5 tablets (100 mg total) by mouth 2 (two) times daily. 08/10/21   Rafael Bihari, FNP  Glucagon (GVOKE HYPOPEN 2-PACK)  1 MG/0.2ML SOAJ Inject 1 Act into the skin daily as needed. Patient not taking: Reported on 09/01/2021 07/23/20   [name], MD  glucose blood (ACCU-CHEK AVIVA PLUS) test strip Use to test blood sugar twice daily. DX E11.8 10/21/20   [name], MD  HYDROcodone-acetaminophen (NORCO) 10-325 MG tablet Take 1 tablet by mouth every 6 (six) hours as needed for moderate pain. 09/07/21   [name], MD  insulin detemir (LEVEMIR FLEXTOUCH) 100 UNIT/ML FlexPen Inject 240 Units into the skin daily. 04/19/21   [name], MD  Insulin Pen Needle (ULTICARE SHORT PEN NEEDLES) 31G X 8 MM MISC USE TWO PEN NEEDLES DAILY WITH INSULIN PENS 07/19/21   [name], MD  levothyroxine (SYNTHROID) 50 MCG tablet TAKE 1 TABLET BY MOUTH DAILY BEFORE BREAKFAST. 05/25/21   [name], MD  losartan (COZAAR) 50 MG tablet Take 1 tablet (50 mg total) by mouth daily. 07/24/20   [name], MD  Multiple Vitamins-Minerals (MULTIVITAMIN ADULT PO) Take 1 tablet by mouth daily.    [provider]  nystatin (MYCOSTATIN/NYSTOP) powder Apply 1 application topically 3 (three) times daily. 09/22/21   Gatha Mayer, MD  potassium chloride SA (KLOR-CON M) 20 MEQ tablet TAKE 1 TABLET BY MOUTH 2 TIMES DAILY. 09/01/21   Bensimhon, Shaune Pascal, MD  RESTASIS 0.05 % ophthalmic emulsion PLACE 1 DROP INTO BOTH EYES TWICE A DAY 05/07/21    [name], MD  SYNJARDY 12.01-999 MG TABS TAKE 1 TABLET BY MOUTH  TWICE DAILY 01/10/21   [name], MD  thiamine (VITAMIN B-1) 100 MG tablet Take 1 tablet (100 mg total) by mouth daily. 04/17/20   [name], MD    Physical Exam: Vitals:   09/28/21 1745 09/28/21 1800 09/28/21 2012 09/28/21 2013  BP:   121/75 121/75  Pulse: 66 69 68 71  Resp:   16 (!) 22  Temp:      TempSrc:      SpO2: (!) 76% 91% 94% 95%  Weight:      Height:        Physical Exam Constitutional:      General: He is not in acute distress. HENT:     Head: Normocephalic and atraumatic.  Eyes:     Extraocular Movements: Extraocular movements intact.     Conjunctiva/sclera: Conjunctivae normal.  Cardiovascular:     Rate and Rhythm: Normal rate and regular rhythm.     Pulses: Normal pulses.  Pulmonary:     Effort: Pulmonary effort is normal. No respiratory distress.     Breath sounds: No wheezing or rales.  Abdominal:     General: Bowel sounds are normal. There is no distension.     Palpations: Abdomen is soft.     Tenderness: There is no abdominal tenderness.  Musculoskeletal:     Cervical back: Normal range of motion and neck supple.     Right lower leg: Edema present.     Left lower leg: Edema present.     Comments: +1 to +2 pitting edema of bilateral lower extremities  Skin:    General: Skin is warm and dry.  Neurological:     General: No focal deficit present.     Mental Status: He is alert and oriented to person, place, and time.     Labs on Admission: I have personally reviewed following labs and imaging studies  CBC: Recent Labs  Lab 09/28/21 1402  WBC 9.2  NEUTROABS 8.0*  HGB 12.6*  HCT 42.6  MCV 98.6  PLT 916   Basic Metabolic Panel: Recent Labs  Lab 09/28/21 1402  NA 139  K 5.8*  CL 96*  CO2 32  GLUCOSE 171*  BUN 39*  CREATININE 2.21*  CALCIUM 9.6   GFR: Estimated Creatinine Clearance: 65.3 mL/min (A) (by C-G formula based on SCr of 2.21 mg/dL (H)). Liver  Function Tests: Recent Labs  Lab 09/28/21 1402  AST 25  ALT 20  ALKPHOS 91  BILITOT 0.6  PROT 9.3*  ALBUMIN 3.9   Recent Labs  Lab 09/28/21 1402  LIPASE 34   No results for input(s): AMMONIA in the last 168 hours. Coagulation Profile: No results for input(s): INR, PROTIME in the last 168 hours. Cardiac Enzymes: No results for input(s): CKTOTAL, CKMB, CKMBINDEX, TROPONINI in the last 168 hours. BNP (last 3 results) No results for input(s): PROBNP in the last 8760 hours. HbA1C: No results for input(s): HGBA1C in the last 72 hours. CBG: No results for input(s): GLUCAP in the last 168 hours. Lipid Profile: No results for input(s): CHOL, HDL, LDLCALC, TRIG, CHOLHDL, LDLDIRECT in the last 72 hours. Thyroid Function Tests: No results for input(s): TSH, T4TOTAL, FREET4, T3FREE, THYROIDAB in the last 72 hours. Anemia Panel: No results for input(s): VITAMINB12, FOLATE, FERRITIN, TIBC, IRON, RETICCTPCT in the last 72 hours. Urine analysis:    Component Value Date/Time   COLORURINE YELLOW 09/28/2021 1934   APPEARANCEUR CLEAR 09/28/2021 1934   LABSPEC 1.011 09/28/2021 1934   PHURINE 5.0 09/28/2021 1934   GLUCOSEU >=500 (A) 09/28/2021 1934   GLUCOSEU >=1000 (A) 12/08/2020 0918   HGBUR NEGATIVE 09/28/2021 1934   BILIRUBINUR NEGATIVE 09/28/2021 1934   KETONESUR NEGATIVE 09/28/2021 1934   PROTEINUR NEGATIVE 09/28/2021 1934   UROBILINOGEN 0.2 12/08/2020 0918   NITRITE NEGATIVE 09/28/2021 1934   LEUKOCYTESUR NEGATIVE 09/28/2021 1934    Radiological Exams on Admission: CT Abdomen Pelvis Wo Contrast  Result Date: 09/28/2021 CLINICAL DATA:  Abdominal pain common nausea, vomiting and constipation times 24 hours. Pain along ventral hernia repair site. EXAM: CT ABDOMEN AND PELVIS WITHOUT CONTRAST TECHNIQUE: Multidetector CT imaging of the abdomen and pelvis was performed following the standard protocol without IV contrast. RADIATION DOSE REDUCTION: This exam was performed according to  the departmental dose-optimization program which includes automated exposure control, adjustment of the mA and/or kV according to patient size and/or use of iterative reconstruction technique. COMPARISON:  CT March 30, 2021. FINDINGS: Lower chest: Chronic areas of cylindrical bronchiectasis with thickening of the peribronchovascular interstitium and mild peribronchovascular ground-glass most pronounced in the left lower lobe, appears similar prior Hepatobiliary: Unremarkable noncontrast appearance of the hepatic parenchyma. Cholelithiasis without findings of acute cholecystitis. No biliary ductal dilation. Pancreas: No pancreatic ductal dilation or evidence of acute inflammation. Spleen: Within normal limits. Adrenals/Urinary Tract: Bilateral adrenal glands are unremarkable. No hydronephrosis. No renal, ureteral or bladder calculi identified. Urinary bladder is unremarkable for degree of distension. Stomach/Bowel: No enteric contrast was administered. Unchanged orientation of the gastric band with reservoir in the right paramedian anterior abdominal wall. There is similar fluid collecting about the reservoir and extending along the tubing into the abdomen. Prominent gas and fluid-filled loops of small bowel without pathologic dilation or abrupt transition point. Fluid-filled proximal colon with relatively decompressed sigmoid and descending colon, suggestive of diarrheal illness. Fat and nonobstructed mid transverse colon containing ventral hernia appears similar to prior with a 3.2 cm aperture width. Vascular/Lymphatic: Aortic and branch vessel atherosclerosis without abdominal aortic aneurysm. No  pathologically enlarged abdominal or pelvic lymph nodes. Reproductive: Prostate is unremarkable. Other: Cutaneous skin thickening with subcutaneous stranding/fluid along the anterior abdominal wall, are similar prior. Similar skin thickening and granulation tissue along the prior midline ventral incision. Musculoskeletal:  Bilateral proximal femoral fixation hardware. Severe right hip degenerative change is unchanged from prior. No acute osseous abnormality visualized. IMPRESSION: 1. Prominent gas and fluid-filled loops of small bowel without pathologic dilation or abrupt transition, favored to reflect enteritis although early/partial obstruction not excluded. 2. Fluid-filled proximal colon with relatively decompressed sigmoid and descending colon, suggestive of diarrheal illness. 3. Similar appearance of the ventral hernia containing fat and a nonobstructed portion of transverse colon, recommend correlation clinically for reducibility. 4. Cholelithiasis without findings of acute cholecystitis. 5. Chronic areas of cylindrical bronchiectasis with thickening of the peribronchovascular interstitium and mild peribronchovascular ground-glass most pronounced in the left lower lobe, appears similar prior. 6. Aortic Atherosclerosis (ICD10-I70.0). Electronically Signed   By: [name] M.D.   On: 09/28/2021 18:13    Assessment/Plan Principal Problem:   Abdominal pain Active Problems:   Essential hypertension   Chronic diastolic CHF (congestive heart failure) (HCC)   Acute-on-chronic kidney injury (HCC)   Hyperkalemia   Abdominal pain, vomiting, constipation CT showing findings suspicious for enteritis versus early/partial SBO, stable ventral hernia. Patient had a very large bowel movement in the ED and soon after started feeling better.  Abdominal pain improved significantly and he is not actively vomiting.  He was able to drink some apple juice but has not tried much solid food.  No fever or leukocytosis. -Gentle IV fluid hydration, Tylenol as needed for pain.  Clear liquid diet for now, advance as tolerated.  Serial abdominal exams.  If abdominal pain worsens or he starts vomiting again, consult general surgery.  Mild hyperkalemia Takes a potassium supplement at home and is also on losartan. -Hold potassium supplement  and losartan.  Give insulin and repeat labs to check potassium level.  AKI on CKD stage IIIb Likely prerenal from dehydration. BUN 39, creatinine 2.2 (baseline 1.8).   -Gentle IV fluid hydration and monitor renal function.  Avoid nephrotoxic agents/hold home Lasix and losartan.  Insulin-dependent type 2 diabetes A1c 9.7 in July 2022. -Repeat A1c.  Hold home basal insulin until his oral intake improves.  Order sliding scale insulin sensitive every 4 hours.  Chronic diastolic CHF Bilateral lower extremity edema chronic per patient.  Not hypoxic and no rales on exam. -Hold diuretic at this time given AKI.  Check BNP level and monitor volume status closely.  Hypertension Stable. -Continue bisoprolol  Hyperlipidemia -Continue Lipitor  Hypothyroidism -Continue Synthroid  DVT prophylaxis: SCDs Code Status: Patient wishes to be DNR. Family Communication: No family available at this time. Disposition Plan: Status is: Observation  The patient remains OBS appropriate and will d/c before 2 midnights.  Level of care: Level of care: Telemetry  The medical decision making on this patient was of high complexity and the patient is at high risk for clinical deterioration, therefore this is a level 3 visit.  Shela Leff MD [facility]  If 7PM-7AM, please contact night-coverage [email or web address]  09/28/2021, 9:32 PM

## 2021-09-29 ENCOUNTER — Telehealth: Payer: Self-pay | Admitting: Internal Medicine

## 2021-09-29 DIAGNOSIS — E875 Hyperkalemia: Secondary | ICD-10-CM

## 2021-09-29 DIAGNOSIS — I10 Essential (primary) hypertension: Secondary | ICD-10-CM | POA: Diagnosis not present

## 2021-09-29 DIAGNOSIS — N179 Acute kidney failure, unspecified: Secondary | ICD-10-CM

## 2021-09-29 DIAGNOSIS — N189 Chronic kidney disease, unspecified: Secondary | ICD-10-CM | POA: Diagnosis not present

## 2021-09-29 DIAGNOSIS — I5032 Chronic diastolic (congestive) heart failure: Secondary | ICD-10-CM

## 2021-09-29 DIAGNOSIS — R109 Unspecified abdominal pain: Secondary | ICD-10-CM | POA: Diagnosis not present

## 2021-09-29 LAB — BASIC METABOLIC PANEL
Anion gap: 7 (ref 5–15)
BUN: 40 mg/dL — ABNORMAL HIGH (ref 8–23)
CO2: 28 mmol/L (ref 22–32)
Calcium: 8.5 mg/dL — ABNORMAL LOW (ref 8.9–10.3)
Chloride: 103 mmol/L (ref 98–111)
Creatinine, Ser: 2.16 mg/dL — ABNORMAL HIGH (ref 0.61–1.24)
GFR, Estimated: 34 mL/min — ABNORMAL LOW (ref >60)
Glucose, Bld: 133 mg/dL — ABNORMAL HIGH (ref 70–99)
Potassium: 4.9 mmol/L (ref 3.5–5.1)
Sodium: 138 mmol/L (ref 135–145)

## 2021-09-29 LAB — GLUCOSE, CAPILLARY
Glucose-Capillary: 150 mg/dL — ABNORMAL HIGH (ref 70–99)
Glucose-Capillary: 141 mg/dL — ABNORMAL HIGH (ref 70–99)
Glucose-Capillary: 176 mg/dL — ABNORMAL HIGH (ref 70–99)
Glucose-Capillary: 181 mg/dL — ABNORMAL HIGH (ref 70–99)

## 2021-09-29 LAB — HIV ANTIBODY (ROUTINE TESTING W REFLEX): HIV Screen 4th Generation wRfx: NONREACTIVE

## 2021-09-29 LAB — BRAIN NATRIURETIC PEPTIDE: B Natriuretic Peptide: 116.2 pg/mL — ABNORMAL HIGH (ref 0.0–100.0)

## 2021-09-29 LAB — HEMOGLOBIN A1C
Hgb A1c MFr Bld: 7.9 % — ABNORMAL HIGH (ref 4.8–5.6)
Mean Plasma Glucose: 180.03 mg/dL

## 2021-09-29 MED ORDER — POLYETHYLENE GLYCOL 3350 17 G PO PACK
17.0000 g | PACK | Freq: Two times a day (BID) | ORAL | 0 refills | Status: DC
Start: 2021-09-29 — End: 2021-10-07

## 2021-09-29 MED ORDER — DOCUSATE SODIUM 100 MG PO CAPS: 100.0000 mg | ORAL_CAPSULE | Freq: Two times a day (BID) | ORAL | 2 refills | Status: DC

## 2021-09-29 NOTE — Discharge Summary (Signed)
Physician Discharge Summary  [name] HGD:[id number] DOB: 01/27/1956 DOA: 09/28/2021  PCP: [name], MD  Admit date: 09/28/2021 Discharge date: 09/29/2021  Recommendations for Outpatient Follow-up:  Discharge to home. Take docusate sodium 100 mg bid and miralax 17 gm with 8 oz fluid bid. Follow up with PCP in 7-10 days. Chemistry to be drawn at that date to be reported to PCP. Recommend weaning down/off of hydrocodone 10 mg Q6 hr prn.  Discharge Diagnoses: Principal diagnosis is #1 Abdominal pain and vomiting due to constipation Constipation due to opioid use Mild hyperkalemia AKI on CKD IIIb Insulin dependent diabetes II Chronic D CHF Hypertension Hyperlipidemia Hypothyroidism  Discharge Condition: Fair  Disposition: Home  Diet recommendation: Heart healthy/Carbohydrate modified.  Filed Weights   09/28/21 1242 09/28/21 2351  Weight: (!) 217.7 kg (!) 218.5 kg    History of present illness: [name] is a 66 y.o. male with medical history significant of colon cancer status post sigmoid colectomy in 2007, ventral hernia, morbid obesity status post lap band in 2011 with current BMI 63.33, cholelithiasis, insulin-dependent type 2 diabetes, chronic diastolic CHF, hypertension, OSA, CKD stage III, hypothyroidism, BPH presented to the ED complaining of abdominal pain, nausea, vomiting, and constipation.  Ventral hernia reducible on exam.  Not febrile.  Labs showing no leukocytosis.  Hemoglobin 12.6, stable.  Potassium 5.8.  BUN 39, creatinine 2.2 (baseline 1.8).  LFTs normal.  Lipase normal.  UA without signs of infection.  CT showing findings suspicious for enteritis versus early/partial SBO, stable ventral hernia. Patient had a large bowel movement in the emergency room after which his abdominal pain improved and he was able to tolerate p.o. intake.  However, abdominal pain recurred and admission requested for observation.  Patient received 1 L normal saline bolus in the  ED.   Patient reports 1 day history of abdominal pain and distention.  Reports multiple episodes of vomiting since yesterday.  He has been constipated with last bowel movement 5 days ago and taking Dulcolax at home.  Patient states while in the ED waiting room, he took another Dulcolax and after a few hours had a "giant" bowel movement and was on the toilet for "20 minutes."  States as soon as he had a bowel movement, his abdominal pain improved drastically and he stopped vomiting.  States he was able to eat "a bite of a sandwich" and drink some apple juice in the ED and has not vomited since then.  Still having some mild abdominal discomfort but significantly improved since after he had a bowel movement.  Hospital Course:  The patient was admitted and given Dulcolax x 2. He had a large BM in the ED. He then had resolution of abdominal pain. He has been able to eat.   The patient will be discharged to home in fair condition.   Today's assessment: S: The patient is resting comfortably.  O: Vitals:  Vitals:   09/29/21 0411 09/29/21 0747  BP: 122/65 114/60  Pulse: 69 65  Resp: 20 18  Temp: 98 F (36.7 C) 97.9 F (36.6 C)  SpO2: 93% 94%    Exam:  Constitutional:  The patient is awake, alert, and oriented x 3. No acute distress. Respiratory:  No increased work of breathing. No wheezes, rales, or rhonchi No tactile fremitus Cardiovascular:  Regular rate and rhythm No murmurs, ectopy, or gallups. No lateral PMI. No thrills. Abdomen:  Abdomen is soft, non-tender, non-distended No hernias, masses, or organomegaly Normoactive bowel sounds.  Musculoskeletal:  No cyanosis, clubbing, or edema Skin:  No rashes, lesions, ulcers palpation of skin: no induration or nodules Neurologic:  CN 2-12 intact Sensation all 4 extremities intact Psychiatric:  Mental status Mood, affect appropriate Orientation to person, place, time  judgment and insight appear intact   Discharge  Instructions  Discharge Instructions     Activity as tolerated - No restrictions   Complete by: As directed    Call MD for:  persistant nausea and vomiting   Complete by: As directed    Call MD for:  severe uncontrolled pain   Complete by: As directed    Diet - low sodium heart healthy   Complete by: As directed    Diet Carb Modified   Complete by: As directed    Discharge instructions   Complete by: As directed    Discharge to home. Take docusate sodium 100 mg bid and miralax 17 gm with 8 oz fluid bid. Follow up with PCP in 7-10 days. Chemistry to be drawn at that date to be reported to PCP. Recommend weaning down/off of hydrocodone 10 mg Q6 hr prn.   Increase activity slowly   Complete by: As directed       Allergies as of 09/29/2021       Reactions   Shellfish Allergy Anaphylaxis   Lisinopril Cough   Lovastatin Itching        Medication List     STOP taking these medications    losartan 50 MG tablet Commonly known as: COZAAR       TAKE these medications    Accu-Chek Aviva Connect w/Device Kit 1 Act by Does not apply route 3 (three) times daily as needed.   Accu-Chek Guide w/Device Kit   Accu-Chek Aviva Plus test strip Generic drug: glucose blood Use to test blood sugar twice daily. DX E11.8   Accu-Chek Softclix Lancets lancets CHECK BLOOD SUGAR 2 TIMES  DAILY   aspirin EC 81 MG tablet Take 1 tablet (81 mg total) by mouth daily.   atorvastatin 40 MG tablet Commonly known as: LIPITOR TAKE 1 TABLET BY MOUTH DAILY.   bisoprolol 10 MG tablet Commonly known as: ZEBETA Take 1 tablet (10 mg total) by mouth every evening.   blood glucose meter kit and supplies Dispense based on patient and insurance preference. Use up to four times daily as directed. (FOR ICD-10 E10.9, E11.9).   blood glucose meter kit and supplies Kit Use to check blood sugar three times daily. Dispense Freestyle Libre according to insurance preference. DX: E11.8    Cholecalciferol 50 MCG (2000 UT) Tabs Take 1 tablet (2,000 Units total) by mouth daily.   cyanocobalamin 2000 MCG tablet Take 1 tablet (2,000 mcg total) by mouth daily.   docusate sodium 100 MG capsule Commonly known as: COLACE Take 100 mg by mouth 2 (two) times daily. What changed: Another medication with the same name was added. Make sure you understand how and when to take each.   docusate sodium 100 MG capsule Commonly known as: Colace Take 1 capsule (100 mg total) by mouth 2 (two) times daily. What changed: You were already taking a medication with the same name, and this prescription was added. Make sure you understand how and when to take each.   ferrous sulfate 325 (65 FE) MG tablet Take 325 mg by mouth 2 (two) times daily with a meal.   FreeStyle Libre 2 Reader Devi 1 Act by Does not apply route daily.   FreeStyle [employer] 2 Sensor Misc  1 Act by Does not apply route daily.   furosemide 20 MG tablet Commonly known as: LASIX Take 5 tablets (100 mg total) by mouth 2 (two) times daily.   Gvoke HypoPen 2-Pack 1 MG/0.2ML Soaj Generic drug: Glucagon Inject 1 Act into the skin daily as needed.   HYDROcodone-acetaminophen 10-325 MG tablet Commonly known as: NORCO Take 1 tablet by mouth every 6 (six) hours as needed for moderate pain.   Levemir FlexTouch 100 UNIT/ML FlexPen Generic drug: insulin detemir Inject 240 Units into the skin daily.   levothyroxine 50 MCG tablet Commonly known as: SYNTHROID TAKE 1 TABLET BY MOUTH DAILY BEFORE BREAKFAST.   MULTIVITAMIN ADULT PO Take 1 tablet by mouth daily.   nystatin powder Commonly known as: MYCOSTATIN/NYSTOP Apply 1 application topically 3 (three) times daily.   polyethylene glycol 17 g packet Commonly known as: MIRALAX / GLYCOLAX Take 17 g by mouth 2 (two) times daily.   potassium chloride SA 20 MEQ tablet Commonly known as: KLOR-CON M TAKE 1 TABLET BY MOUTH 2 TIMES DAILY.   ProAir RespiClick 321 (90 Base) MCG/ACT  Aepb Generic drug: Albuterol Sulfate Inhale 1 puff into the lungs 4 (four) times daily as needed. What changed: reasons to take this   Restasis 0.05 % ophthalmic emulsion Generic drug: cycloSPORINE PLACE 1 DROP INTO BOTH EYES TWICE A DAY What changed: See the new instructions.   Synjardy 12.01-999 MG Tabs Generic drug: Empagliflozin-metFORMIN HCl TAKE 1 TABLET BY MOUTH  TWICE DAILY   thiamine 100 MG tablet Commonly known as: Vitamin B-1 Take 1 tablet (100 mg total) by mouth daily.   UltiCare Short Pen Needles 31G X 8 MM Misc Generic drug: Insulin Pen Needle USE TWO PEN NEEDLES DAILY WITH INSULIN PENS       Allergies  Allergen Reactions   Shellfish Allergy Anaphylaxis   Lisinopril Cough   Lovastatin Itching    The results of significant diagnostics from this hospitalization (including imaging, microbiology, ancillary and laboratory) are listed below for reference.    Significant Diagnostic Studies: CT Abdomen Pelvis Wo Contrast  Result Date: 09/28/2021 CLINICAL DATA:  Abdominal pain common nausea, vomiting and constipation times 24 hours. Pain along ventral hernia repair site. EXAM: CT ABDOMEN AND PELVIS WITHOUT CONTRAST TECHNIQUE: Multidetector CT imaging of the abdomen and pelvis was performed following the standard protocol without IV contrast. RADIATION DOSE REDUCTION: This exam was performed according to the departmental dose-optimization program which includes automated exposure control, adjustment of the mA and/or kV according to patient size and/or use of iterative reconstruction technique. COMPARISON:  CT March 30, 2021. FINDINGS: Lower chest: Chronic areas of cylindrical bronchiectasis with thickening of the peribronchovascular interstitium and mild peribronchovascular ground-glass most pronounced in the left lower lobe, appears similar prior Hepatobiliary: Unremarkable noncontrast appearance of the hepatic parenchyma. Cholelithiasis without findings of acute  cholecystitis. No biliary ductal dilation. Pancreas: No pancreatic ductal dilation or evidence of acute inflammation. Spleen: Within normal limits. Adrenals/Urinary Tract: Bilateral adrenal glands are unremarkable. No hydronephrosis. No renal, ureteral or bladder calculi identified. Urinary bladder is unremarkable for degree of distension. Stomach/Bowel: No enteric contrast was administered. Unchanged orientation of the gastric band with reservoir in the right paramedian anterior abdominal wall. There is similar fluid collecting about the reservoir and extending along the tubing into the abdomen. Prominent gas and fluid-filled loops of small bowel without pathologic dilation or abrupt transition point. Fluid-filled proximal colon with relatively decompressed sigmoid and descending colon, suggestive of diarrheal illness. Fat and nonobstructed mid transverse colon  containing ventral hernia appears similar to prior with a 3.2 cm aperture width. Vascular/Lymphatic: Aortic and branch vessel atherosclerosis without abdominal aortic aneurysm. No pathologically enlarged abdominal or pelvic lymph nodes. Reproductive: Prostate is unremarkable. Other: Cutaneous skin thickening with subcutaneous stranding/fluid along the anterior abdominal wall, are similar prior. Similar skin thickening and granulation tissue along the prior midline ventral incision. Musculoskeletal: Bilateral proximal femoral fixation hardware. Severe right hip degenerative change is unchanged from prior. No acute osseous abnormality visualized. IMPRESSION: 1. Prominent gas and fluid-filled loops of small bowel without pathologic dilation or abrupt transition, favored to reflect enteritis although early/partial obstruction not excluded. 2. Fluid-filled proximal colon with relatively decompressed sigmoid and descending colon, suggestive of diarrheal illness. 3. Similar appearance of the ventral hernia containing fat and a nonobstructed portion of transverse  colon, recommend correlation clinically for reducibility. 4. Cholelithiasis without findings of acute cholecystitis. 5. Chronic areas of cylindrical bronchiectasis with thickening of the peribronchovascular interstitium and mild peribronchovascular ground-glass most pronounced in the left lower lobe, appears similar prior. 6. Aortic Atherosclerosis (ICD10-I70.0). Electronically Signed   By: [name] M.D.   On: 09/28/2021 18:13    Microbiology: Recent Results (from the past 240 hour(s))  Resp Panel by RT-PCR (Flu A&B, Covid) Nasopharyngeal Swab     Status: None   Collection Time: 09/28/21 10:07 PM   Specimen: Nasopharyngeal Swab; Nasopharyngeal(NP) swabs in vial transport medium  Result Value Ref Range Status   SARS Coronavirus 2 by RT PCR NEGATIVE NEGATIVE Final    Comment: (NOTE) SARS-CoV-2 target nucleic acids are NOT DETECTED.  The SARS-CoV-2 RNA is generally detectable in upper respiratory specimens during the acute phase of infection. The lowest concentration of SARS-CoV-2 viral copies this assay can detect is 138 copies/mL. A negative result does not preclude SARS-Cov-2 infection and should not be used as the sole basis for treatment or other patient management decisions. A negative result may occur with  improper specimen collection/handling, submission of specimen other than nasopharyngeal swab, presence of viral mutation(s) within the areas targeted by this assay, and inadequate number of viral copies(<138 copies/mL). A negative result must be combined with clinical observations, patient history, and epidemiological information. The expected result is Negative.  Fact Sheet for Patients:  EntrepreneurPulse.com.au  Fact Sheet for Healthcare Providers:  IncredibleEmployment.be  This test is no t yet approved or cleared by the [place] FDA and  has been authorized for detection and/or diagnosis of SARS-CoV-2 by FDA under an Emergency  Use Authorization (EUA). This EUA will remain  in effect (meaning this test can be used) for the duration of the COVID-19 declaration under Section 564(b)(1) of the Act, 21 U.S.C.section 360bbb-3(b)(1), unless the authorization is terminated  or revoked sooner.       Influenza A by PCR NEGATIVE NEGATIVE Final   Influenza B by PCR NEGATIVE NEGATIVE Final    Comment: (NOTE) The Xpert Xpress SARS-CoV-2/FLU/RSV plus assay is intended as an aid in the diagnosis of influenza from Nasopharyngeal swab specimens and should not be used as a sole basis for treatment. Nasal washings and aspirates are unacceptable for Xpert Xpress SARS-CoV-2/FLU/RSV testing.  Fact Sheet for Patients: EntrepreneurPulse.com.au  Fact Sheet for Healthcare Providers: IncredibleEmployment.be  This test is not yet approved or cleared by the [place] FDA and has been authorized for detection and/or diagnosis of SARS-CoV-2 by FDA under an Emergency Use Authorization (EUA). This EUA will remain in effect (meaning this test can be used) for the duration of the COVID-19 declaration under  Section 564(b)(1) of the Act, 21 U.S.C. section 360bbb-3(b)(1), unless the authorization is terminated or revoked.  Performed at [facility], [place] [address], [place], [place] [address]      Labs: Basic Metabolic Panel: Recent Labs  Lab 09/28/21 1402 09/29/21 0422  NA 139 138  K 5.8* 4.9  CL 96* 103  CO2 32 28  GLUCOSE 171* 133*  BUN 39* 40*  CREATININE 2.21* 2.16*  CALCIUM 9.6 8.5*   Liver Function Tests: Recent Labs  Lab 09/28/21 1402  AST 25  ALT 20  ALKPHOS 91  BILITOT 0.6  PROT 9.3*  ALBUMIN 3.9   Recent Labs  Lab 09/28/21 1402  LIPASE 34   No results for input(s): AMMONIA in the last 168 hours. CBC: Recent Labs  Lab 09/28/21 1402  WBC 9.2  NEUTROABS 8.0*  HGB 12.6*  HCT 42.6  MCV 98.6  PLT 263   Cardiac Enzymes: No results for  input(s): CKTOTAL, CKMB, CKMBINDEX, TROPONINI in the last 168 hours. BNP: BNP (last 3 results) Recent Labs    03/29/21 2336 08/10/21 0935 09/29/21 0422  BNP 89.0 272.2* 116.2*    ProBNP (last 3 results) No results for input(s): PROBNP in the last 8760 hours.  CBG: Recent Labs  Lab 09/28/21 2250 09/28/21 2359 09/29/21 0407 09/29/21 0744 09/29/21 1119  GLUCAP 189* 176* 150* 141* 181*    Principal Problem:   Abdominal pain Active Problems:   Essential hypertension   Chronic diastolic CHF (congestive heart failure) (HCC)   Acute-on-chronic kidney injury (HCC)   Hyperkalemia  Time coordinating discharge: 38 minutes.  Signed:        Maggie Dworkin, DO [facility]  09/29/2021, 4:31 PM

## 2021-09-29 NOTE — Telephone Encounter (Signed)
Patient's states he never received Blood Glucose Monitoring Suppl (ACCU-CHEK AVIVA CONNECT) w/Device KIT  Informed patient rx was sent to [facility] on 11-09-2020, patient states [facility] states a rx was never received   Patient also states he never received Continuous Blood Gluc Receiver (FREESTYLE LIBRE 2 READER) [place]  Informed patient rx was sent to [facility] on 07-27-2021   Please advise

## 2021-09-29 NOTE — Progress Notes (Signed)
RN reviewed discharge paperwork with patient. All questions addressed. IV removed. Tele removed. This RN set up transportation home for patient as pt does not have a ride. No further needs.

## 2021-09-30 ENCOUNTER — Telehealth (HOSPITAL_COMMUNITY): Payer: Self-pay

## 2021-09-30 NOTE — Telephone Encounter (Signed)
I attempted to reach [name] for scheduling a home visit with no success. (Text and voicemail left) I will attempt to reach out again next week.

## 2021-10-01 DIAGNOSIS — H35371 Puckering of macula, right eye: Secondary | ICD-10-CM | POA: Diagnosis not present

## 2021-10-01 DIAGNOSIS — H31091 Other chorioretinal scars, right eye: Secondary | ICD-10-CM | POA: Diagnosis not present

## 2021-10-01 DIAGNOSIS — H3561 Retinal hemorrhage, right eye: Secondary | ICD-10-CM | POA: Diagnosis not present

## 2021-10-01 DIAGNOSIS — E113513 Type 2 diabetes mellitus with proliferative diabetic retinopathy with macular edema, bilateral: Secondary | ICD-10-CM | POA: Diagnosis not present

## 2021-10-01 DIAGNOSIS — H35361 Drusen (degenerative) of macula, right eye: Secondary | ICD-10-CM | POA: Diagnosis not present

## 2021-10-01 LAB — HM DIABETES EYE EXAM

## 2021-10-04 ENCOUNTER — Ambulatory Visit: Payer: Medicare Other | Admitting: *Deleted

## 2021-10-04 DIAGNOSIS — I5032 Chronic diastolic (congestive) heart failure: Secondary | ICD-10-CM

## 2021-10-04 DIAGNOSIS — E119 Type 2 diabetes mellitus without complications: Secondary | ICD-10-CM

## 2021-10-04 NOTE — Chronic Care Management (AMB) (Signed)
Chronic Care Management   CCM RN Visit Note  10/04/2021 Name: [name] MRN: [id number] DOB: 11-15-1955  Subjective: [name] is a 66 y.o. year old male who is a primary care patient of [name], MD. The care management team was consulted for assistance with disease management and care coordination needs.    Engaged with patient by telephone for initial visit in response to provider referral for case management and/or care coordination services.   Successfully connected with patient to complete CCM RN CM initial outreach/ assessment; patient is very confrontational and upset from outset of conversation; he tells me that "y'all aren't doing anything to help me and none of you have any compassion about what I am going through;" he immediately tells me "all he needs is to get a CGM, and it is on my medication list, so I don't know why you don't give it to me;" I had previous care coordination outreach with CCM Pharmacist Quillian Quince about patient's request for CGM; Dan had shared that he has attempted to explain to patient in the past that he does not qualify for (insurance coverage) CGM-- I attempted to explain this to the patient several times who reports he "has never spoken to any pharmacist," and further states that "Dr. [name] should know how I take my insulin; I take it twice a day."  I attempted several times in the course of our 10 minute phone conversation to explain role of CCM team, and also to clarify why he does not qualify for CGM, based on CCM Pharmacy team prior communication with patient.  Patient increasingly agitated/ angry/yelling loudly throughout today's call, and he ends up by telling me that he does not want to talk to "any of [place]" ever again because "none of you can help me."  He adds that he has an appointment with PCP this week, and he will discuss with him at that time.  Patient clarified that he does not wish for me to contact him again because he "has no goals" other  than to "get the CGM that I need."  Consent to Services:  The patient was given information about Chronic Care Management services, agreed to services, and gave verbal consent 12/04/20 prior to initiation of services.  Please see initial visit note for detailed documentation. Patient agreed to services and verbal consent obtained.   Assessment: Review of patient past medical history, allergies, medications, health status, including review of consultants reports, laboratory and other test data, was performed as part of comprehensive evaluation and provision of chronic care management services.  CCM Care Plan  Allergies  Allergen Reactions   Shellfish Allergy Anaphylaxis   Lisinopril Cough   Lovastatin Itching   Outpatient Encounter Medications as of 10/04/2021  Medication Sig   Accu-Chek Softclix Lancets lancets CHECK BLOOD SUGAR 2 TIMES  DAILY   Albuterol Sulfate (PROAIR RESPICLICK) 621 (90 Base) MCG/ACT AEPB Inhale 1 puff into the lungs 4 (four) times daily as needed. (Patient taking differently: Inhale 1 puff into the lungs 4 (four) times daily as needed (shortness of breath/wheezing).)   aspirin EC 81 MG tablet Take 1 tablet (81 mg total) by mouth daily.   atorvastatin (LIPITOR) 40 MG tablet TAKE 1 TABLET BY MOUTH DAILY.   bisoprolol (ZEBETA) 10 MG tablet Take 1 tablet (10 mg total) by mouth every evening.   blood glucose meter kit and supplies KIT Use to check blood sugar three times daily. Dispense Freestyle Libre according to insurance preference. DX: E11.8  blood glucose meter kit and supplies Dispense based on patient and insurance preference. Use up to four times daily as directed. (FOR ICD-10 E10.9, E11.9).   Blood Glucose Monitoring Suppl (ACCU-CHEK AVIVA CONNECT) w/Device KIT 1 Act by Does not apply route 3 (three) times daily as needed.   Blood Glucose Monitoring Suppl (ACCU-CHEK GUIDE) w/Device KIT    Cholecalciferol 50 MCG (2000 UT) TABS Take 1 tablet (2,000 Units total) by  mouth daily.   Continuous Blood Gluc Receiver (FREESTYLE LIBRE 2 READER) DEVI 1 Act by Does not apply route daily.   Continuous Blood Gluc Sensor (FREESTYLE LIBRE 2 SENSOR) MISC 1 Act by Does not apply route daily.   cyanocobalamin 2000 MCG tablet Take 1 tablet (2,000 mcg total) by mouth daily.   docusate sodium (COLACE) 100 MG capsule Take 100 mg by mouth 2 (two) times daily.   docusate sodium (COLACE) 100 MG capsule Take 1 capsule (100 mg total) by mouth 2 (two) times daily.   ferrous sulfate 325 (65 FE) MG tablet Take 325 mg by mouth 2 (two) times daily with a meal.   furosemide (LASIX) 20 MG tablet Take 5 tablets (100 mg total) by mouth 2 (two) times daily.   Glucagon (GVOKE HYPOPEN 2-PACK) 1 MG/0.2ML SOAJ Inject 1 Act into the skin daily as needed. (Patient not taking: Reported on 09/01/2021)   glucose blood (ACCU-CHEK AVIVA PLUS) test strip Use to test blood sugar twice daily. DX E11.8   HYDROcodone-acetaminophen (NORCO) 10-325 MG tablet Take 1 tablet by mouth every 6 (six) hours as needed for moderate pain.   insulin detemir (LEVEMIR FLEXTOUCH) 100 UNIT/ML FlexPen Inject 240 Units into the skin daily.   Insulin Pen Needle (ULTICARE SHORT PEN NEEDLES) 31G X 8 MM MISC USE TWO PEN NEEDLES DAILY WITH INSULIN PENS   levothyroxine (SYNTHROID) 50 MCG tablet TAKE 1 TABLET BY MOUTH DAILY BEFORE BREAKFAST. (Patient taking differently: Take 50 mcg by mouth daily before breakfast.)   Multiple Vitamins-Minerals (MULTIVITAMIN ADULT PO) Take 1 tablet by mouth daily.   nystatin (MYCOSTATIN/NYSTOP) powder Apply 1 application topically 3 (three) times daily.   polyethylene glycol (MIRALAX / GLYCOLAX) 17 g packet Take 17 g by mouth 2 (two) times daily.   potassium chloride SA (KLOR-CON M) 20 MEQ tablet TAKE 1 TABLET BY MOUTH 2 TIMES DAILY.   RESTASIS 0.05 % ophthalmic emulsion PLACE 1 DROP INTO BOTH EYES TWICE A DAY (Patient taking differently: Place 1 drop into both eyes 2 (two) times daily.)   SYNJARDY  12.01-999 MG TABS TAKE 1 TABLET BY MOUTH  TWICE DAILY   thiamine (VITAMIN B-1) 100 MG tablet Take 1 tablet (100 mg total) by mouth daily.   No facility-administered encounter medications on file as of 10/04/2021.   Patient Active Problem List   Diagnosis Date Noted   Abdominal pain 09/28/2021   Hyperkalemia 09/28/2021   Insulin-requiring or dependent type II diabetes mellitus ([place]) 07/27/2021   Encounter for general adult medical examination with abnormal findings 04/06/2021   Calculus of gallbladder without cholecystitis without obstruction 04/06/2021   Chronic idiopathic constipation 11/09/2020   PAD (peripheral artery disease) ([place]) 07/24/2020   Ulcer of left foot, limited to breakdown of skin ([place]) 07/23/2020   Drug-induced erectile dysfunction 05/22/2019   Onychomycosis of toenail 05/21/2019   Class 3 obesity with alveolar hypoventilation, serious comorbidity, and body mass index (BMI) of 50.0 to 59.9 in adult ([place]) 02/07/2019   Ventral hernia without obstruction or gangrene 05/31/2018   Therapeutic opioid-induced constipation (OIC)  05/31/2018   Long-term current use of opiate analgesic 02/15/2018   Left lumbar radiculopathy 01/29/2018   B12 deficiency 10/17/2017   Bleeding internal hemorrhoids    Insomnia 06/30/2017   Left eye affected by proliferative diabetic retinopathy with traction retinal detachment not involving macula, associated with type 2 diabetes mellitus ([place]) 04/20/2017   Right eye affected by proliferative diabetic retinopathy with traction retinal detachment involving macula, associated with type 2 diabetes mellitus ([place]) 04/20/2017   Vitamin D deficiency 12/23/2016   Thiamine deficiency 08/02/2016   Primary osteoarthritis involving multiple joints 08/03/2015   Diabetes mellitus type II, uncontrolled 05/23/2014   Herpesviral keratitis 05/23/2014   Diabetes mellitus due to underlying condition with both eyes affected by proliferative retinopathy without macular  edema, with long-term current use of insulin ([place]) 05/23/2014   Diabetic foot ulcer 02/05/2014   Iron deficiency anemia 01/09/2014   Osteoarthritis of hip 05/08/2013   Diabetic nephropathy ([place]) 05/07/2013   Bariatric surgery status 01/29/2013   Acute-on-chronic kidney injury ([place]) 01/16/2013   Benign prostatic hyperplasia without lower urinary tract symptoms 01/11/2013   Hypothyroidism 01/11/2013   Colon cancer ([place]) 10/04/2012   Chronic diastolic CHF (congestive heart failure) ([place]) 04/13/2012   Diabetic peripheral neuropathy ([place]) 05/29/2008   OSA (obstructive sleep apnea) 10/01/2007   CARDIOMYOPATHY 10/01/2007   Type II diabetes mellitus with manifestations ([place]) 07/24/2007   Hyperlipidemia with target LDL less than 100 07/24/2007   Essential hypertension 07/24/2007   Conditions to be addressed/monitored:  CHF and DMII  Plan: No further follow up required: patient declines ongoing CCM RN CM involvement- RN CM case closure accordingly; made patient's PCP and CCM Pharmacy aware of same  Oneta Rack, RN, BSN, [place] [phone]: direct office

## 2021-10-06 ENCOUNTER — Telehealth (HOSPITAL_COMMUNITY): Payer: Self-pay | Admitting: Licensed Clinical Social Worker

## 2021-10-06 NOTE — Telephone Encounter (Signed)
HF Paramedicine Team Based Care Meeting  HF MD- NA  HF NP - [place] NP-C   [facility] admit within the last 30 days for heart failure? no  Medications concerns? Compliant with medications  Eligible for discharge?  No concerns at this time well connected with medical providers- plan for [place], [facility] Desk#: [phone] Cell#: [phone]

## 2021-10-07 ENCOUNTER — Other Ambulatory Visit (INDEPENDENT_AMBULATORY_CARE_PROVIDER_SITE_OTHER): Payer: Commercial Managed Care - HMO

## 2021-10-07 ENCOUNTER — Other Ambulatory Visit: Payer: Self-pay

## 2021-10-07 ENCOUNTER — Telehealth: Payer: Self-pay | Admitting: Internal Medicine

## 2021-10-07 ENCOUNTER — Encounter: Payer: Self-pay | Admitting: Internal Medicine

## 2021-10-07 ENCOUNTER — Ambulatory Visit (INDEPENDENT_AMBULATORY_CARE_PROVIDER_SITE_OTHER): Payer: Commercial Managed Care - HMO | Admitting: Internal Medicine

## 2021-10-07 VITALS — BP 128/88 | HR 76 | Temp 98.3°F | Ht 73.0 in

## 2021-10-07 DIAGNOSIS — M5416 Radiculopathy, lumbar region: Secondary | ICD-10-CM

## 2021-10-07 DIAGNOSIS — E119 Type 2 diabetes mellitus without complications: Secondary | ICD-10-CM | POA: Diagnosis not present

## 2021-10-07 DIAGNOSIS — T402X5A Adverse effect of other opioids, initial encounter: Secondary | ICD-10-CM

## 2021-10-07 DIAGNOSIS — E1121 Type 2 diabetes mellitus with diabetic nephropathy: Secondary | ICD-10-CM

## 2021-10-07 DIAGNOSIS — M159 Polyosteoarthritis, unspecified: Secondary | ICD-10-CM

## 2021-10-07 DIAGNOSIS — E039 Hypothyroidism, unspecified: Secondary | ICD-10-CM

## 2021-10-07 DIAGNOSIS — D508 Other iron deficiency anemias: Secondary | ICD-10-CM | POA: Diagnosis not present

## 2021-10-07 DIAGNOSIS — K5903 Drug induced constipation: Secondary | ICD-10-CM | POA: Diagnosis not present

## 2021-10-07 DIAGNOSIS — Z794 Long term (current) use of insulin: Secondary | ICD-10-CM

## 2021-10-07 DIAGNOSIS — E662 Morbid (severe) obesity with alveolar hypoventilation: Secondary | ICD-10-CM | POA: Diagnosis not present

## 2021-10-07 DIAGNOSIS — E1142 Type 2 diabetes mellitus with diabetic polyneuropathy: Secondary | ICD-10-CM | POA: Diagnosis not present

## 2021-10-07 DIAGNOSIS — E538 Deficiency of other specified B group vitamins: Secondary | ICD-10-CM

## 2021-10-07 DIAGNOSIS — E519 Thiamine deficiency, unspecified: Secondary | ICD-10-CM | POA: Diagnosis not present

## 2021-10-07 DIAGNOSIS — Z6841 Body Mass Index (BMI) 40.0 and over, adult: Secondary | ICD-10-CM

## 2021-10-07 LAB — CBC WITH DIFFERENTIAL/PLATELET
Basophils Absolute: 0 10*3/uL (ref 0.0–0.1)
Basophils Relative: 0.3 % (ref 0.0–3.0)
Eosinophils Absolute: 0.2 10*3/uL (ref 0.0–0.7)
Eosinophils Relative: 3.1 % (ref 0.0–5.0)
HCT: 33.4 % — ABNORMAL LOW (ref 39.0–52.0)
Hemoglobin: 10.2 g/dL — ABNORMAL LOW (ref 13.0–17.0)
Lymphocytes Relative: 18.6 % (ref 12.0–46.0)
Lymphs Abs: 1.2 10*3/uL (ref 0.7–4.0)
MCHC: 30.6 g/dL (ref 30.0–36.0)
MCV: 93.3 fl (ref 78.0–100.0)
Monocytes Absolute: 0.5 10*3/uL (ref 0.1–1.0)
Monocytes Relative: 7.3 % (ref 3.0–12.0)
Neutro Abs: 4.7 10*3/uL (ref 1.4–7.7)
Neutrophils Relative %: 70.7 % (ref 43.0–77.0)
Platelets: 212 10*3/uL (ref 150.0–400.0)
RBC: 3.58 Mil/uL — ABNORMAL LOW (ref 4.22–5.81)
RDW: 17.8 % — ABNORMAL HIGH (ref 11.5–15.5)
WBC: 6.7 10*3/uL (ref 4.0–10.5)

## 2021-10-07 LAB — IBC + FERRITIN
Ferritin: 485.3 ng/mL — ABNORMAL HIGH (ref 22.0–322.0)
Iron: 20 ug/dL — ABNORMAL LOW (ref 42–165)
Saturation Ratios: 7.7 % — ABNORMAL LOW (ref 20.0–50.0)
TIBC: 259 ug/dL (ref 250.0–450.0)
Transferrin: 185 mg/dL — ABNORMAL LOW (ref 212.0–360.0)

## 2021-10-07 LAB — TSH: TSH: 3.53 u[IU]/mL (ref 0.35–5.50)

## 2021-10-07 LAB — FOLATE: Folate: 9.6 ng/mL (ref >5.9)

## 2021-10-07 MED ORDER — FERROUS SULFATE 325 (65 FE) MG PO TABS: 325.0000 mg | ORAL_TABLET | Freq: Two times a day (BID) | ORAL | 1 refills | Status: AC

## 2021-10-07 MED ORDER — VITAMIN B-1 100 MG PO TABS: 100.0000 mg | ORAL_TABLET | Freq: Every day | ORAL | 1 refills | Status: DC

## 2021-10-07 MED ORDER — TIRZEPATIDE 2.5 MG/0.5ML ~~LOC~~ SOAJ: 2.5000 mg | SUBCUTANEOUS | 0 refills | Status: DC

## 2021-10-07 MED ORDER — RELISTOR 150 MG PO TABS: 3.0000 | ORAL_TABLET | Freq: Every day | ORAL | 1 refills | Status: DC

## 2021-10-07 MED ORDER — HYDROCODONE-ACETAMINOPHEN 10-325 MG PO TABS: 1.0000 | ORAL_TABLET | Freq: Four times a day (QID) | ORAL | 0 refills | Status: DC | PRN

## 2021-10-07 MED ORDER — CYANOCOBALAMIN 1000 MCG/ML IJ SOLN
1000.0000 ug | Freq: Once | INTRAMUSCULAR | Status: AC
  Administered 2021-10-07: 1000 ug via INTRAMUSCULAR

## 2021-10-07 NOTE — Telephone Encounter (Signed)
1.Medication Requested: HYDROcodone-acetaminophen (NORCO) 10-325 MG tablet 2. Pharmacy (Name, [facility], [facility]): [place], [address] Phone:  [phone]  Fax:  [phone]     3. On Med List: Y  4. Last Visit with PCP: T  5. Next visit date with PCP:   Agent: Please be advised that RX refills may take up to 3 business days. We ask that you follow-up with your pharmacy.

## 2021-10-07 NOTE — Progress Notes (Signed)
Subjective:  Patient ID: [name], male    DOB: 1956-03-17  Age: 66 y.o. MRN: [id number]  CC: Anemia, Diabetes, and Osteoarthritis  This visit occurred during the SARS-CoV-2 public health emergency.  Safety protocols were in place, including screening questions prior to the visit, additional usage of staff PPE, and extensive cleaning of exam room while observing appropriate contact time as indicated for disinfecting solutions.    HPI [name] presents for f/up -  He continues to complain of constipation.  He has not gotten much symptom relief with Dulcolax.  He was recently seen in the ED about this.  He continues to complain of musculoskeletal pain and does not want of the lower dosage of hydrocodone.  He complains of shortness of breath but denies chest pain or cough.  Outpatient Medications Prior to Visit  Medication Sig Dispense Refill   Accu-Chek Softclix Lancets lancets CHECK BLOOD SUGAR 2 TIMES  DAILY 300 each 3   Albuterol Sulfate (PROAIR RESPICLICK) 973 (90 Base) MCG/ACT AEPB Inhale 1 puff into the lungs 4 (four) times daily as needed. (Patient taking differently: Inhale 1 puff into the lungs 4 (four) times daily as needed (shortness of breath/wheezing).) 3 each 1   aspirin EC 81 MG tablet Take 1 tablet (81 mg total) by mouth daily. 90 tablet 1   atorvastatin (LIPITOR) 40 MG tablet TAKE 1 TABLET BY MOUTH DAILY. 90 tablet 1   bisoprolol (ZEBETA) 10 MG tablet Take 1 tablet (10 mg total) by mouth every evening. 90 tablet 1   blood glucose meter kit and supplies KIT Use to check blood sugar three times daily. Dispense Freestyle Libre according to insurance preference. DX: E11.8 2 each 0   blood glucose meter kit and supplies Dispense based on patient and insurance preference. Use up to four times daily as directed. (FOR ICD-10 E10.9, E11.9). 1 each 0   Blood Glucose Monitoring Suppl (ACCU-CHEK AVIVA CONNECT) w/Device KIT 1 Act by Does not apply route 3 (three) times daily as  needed. 1 kit 3   Blood Glucose Monitoring Suppl (ACCU-CHEK GUIDE) w/Device KIT      Cholecalciferol 50 MCG (2000 UT) TABS Take 1 tablet (2,000 Units total) by mouth daily. 90 tablet 1   Continuous Blood Gluc Receiver (FREESTYLE LIBRE 2 READER) DEVI 1 Act by Does not apply route daily. 2 each 5   Continuous Blood Gluc Sensor (FREESTYLE LIBRE 2 SENSOR) MISC 1 Act by Does not apply route daily. 2 each 5   cyanocobalamin 2000 MCG tablet Take 1 tablet (2,000 mcg total) by mouth daily. 90 tablet 1   furosemide (LASIX) 20 MG tablet Take 5 tablets (100 mg total) by mouth 2 (two) times daily. 300 tablet 3   Glucagon (GVOKE HYPOPEN 2-PACK) 1 MG/0.2ML SOAJ Inject 1 Act into the skin daily as needed. 2 mL 5   glucose blood (ACCU-CHEK AVIVA PLUS) test strip Use to test blood sugar twice daily. DX E11.8 200 strip 3   insulin detemir (LEVEMIR FLEXTOUCH) 100 UNIT/ML FlexPen Inject 240 Units into the skin daily. 75 mL 5   Insulin Pen Needle (ULTICARE SHORT PEN NEEDLES) 31G X 8 MM MISC USE TWO PEN NEEDLES DAILY WITH INSULIN PENS 200 each 2   levothyroxine (SYNTHROID) 50 MCG tablet TAKE 1 TABLET BY MOUTH DAILY BEFORE BREAKFAST. (Patient taking differently: Take 50 mcg by mouth daily before breakfast.) 90 tablet 0   Multiple Vitamins-Minerals (MULTIVITAMIN ADULT PO) Take 1 tablet by mouth daily.  nystatin (MYCOSTATIN/NYSTOP) powder Apply 1 application topically 3 (three) times daily. 60 g 5   potassium chloride SA (KLOR-CON M) 20 MEQ tablet TAKE 1 TABLET BY MOUTH 2 TIMES DAILY. 180 tablet 3   RESTASIS 0.05 % ophthalmic emulsion PLACE 1 DROP INTO BOTH EYES TWICE A DAY (Patient taking differently: Place 1 drop into both eyes 2 (two) times daily.) 60 each 5   SYNJARDY 12.01-999 MG TABS TAKE 1 TABLET BY MOUTH  TWICE DAILY 180 tablet 1   docusate sodium (COLACE) 100 MG capsule Take 100 mg by mouth 2 (two) times daily.     docusate sodium (COLACE) 100 MG capsule Take 1 capsule (100 mg total) by mouth 2 (two) times  daily. 60 capsule 2   ferrous sulfate 325 (65 FE) MG tablet Take 325 mg by mouth 2 (two) times daily with a meal.     HYDROcodone-acetaminophen (NORCO) 10-325 MG tablet Take 1 tablet by mouth every 6 (six) hours as needed for moderate pain. 65 tablet 0   polyethylene glycol (MIRALAX / GLYCOLAX) 17 g packet Take 17 g by mouth 2 (two) times daily. 14 each 0   thiamine (VITAMIN B-1) 100 MG tablet Take 1 tablet (100 mg total) by mouth daily. 90 tablet 1   No facility-administered medications prior to visit.    ROS Review of Systems  Constitutional:  Negative for diaphoresis and fatigue.  HENT: Negative.    Respiratory:  Positive for shortness of breath. Negative for cough, chest tightness and wheezing.   Cardiovascular:  Negative for chest pain, palpitations and leg swelling.  Gastrointestinal:  Positive for constipation. Negative for abdominal pain, anal bleeding, blood in stool, diarrhea, nausea and vomiting.  Endocrine: Negative.   Genitourinary:  Negative for difficulty urinating.  Musculoskeletal:  Positive for arthralgias and back pain. Negative for myalgias.  Skin: Negative.   Neurological: Negative.  Negative for dizziness and weakness.  Hematological:  Negative for adenopathy. Does not bruise/bleed easily.  Psychiatric/Behavioral: Negative.     Objective:  BP 128/88 (BP Location: Left Arm, Patient Position: Sitting, Cuff Size: Large)    Pulse 76    Temp 98.3 F (36.8 C) (Oral)    Ht 6' 1" (1.854 m)    SpO2 92%    BMI 63.55 kg/m   BP Readings from Last 3 Encounters:  10/07/21 128/88  09/29/21 114/60  09/22/21 110/64    Wt Readings from Last 3 Encounters:  09/28/21 (!) 481 lb 11.3 oz (218.5 kg)  09/22/21 (!) 480 lb (217.7 kg)  08/10/21 (!) 486 lb 12.8 oz (220.8 kg)    Physical Exam Constitutional:      Appearance: He is obese. He is ill-appearing.  HENT:     Nose: Nose normal.     Mouth/Throat:     Mouth: Mucous membranes are moist.  Eyes:     General: No scleral  icterus.    Conjunctiva/sclera: Conjunctivae normal.  Cardiovascular:     Rate and Rhythm: Normal rate and regular rhythm.     Heart sounds: No murmur heard. Pulmonary:     Effort: Pulmonary effort is normal.     Breath sounds: No stridor. No wheezing, rhonchi or rales.  Abdominal:     General: Abdomen is protuberant. Bowel sounds are decreased. There is no distension.     Palpations: Abdomen is soft. There is no hepatomegaly, splenomegaly or mass.     Tenderness: There is no abdominal tenderness. There is no guarding or rebound.     Hernia: A  hernia is present.  Musculoskeletal:        General: Normal range of motion.     Cervical back: Neck supple.     Right lower leg: Edema (trace pitting) present.     Left lower leg: Edema (trace pitting) present.  Lymphadenopathy:     Cervical: No cervical adenopathy.  Skin:    General: Skin is warm and dry.  Neurological:     General: No focal deficit present.    Lab Results  Component Value Date   WBC 6.7 10/07/2021   HGB 10.2 (L) 10/07/2021   HCT 33.4 (L) 10/07/2021   PLT 212.0 10/07/2021   GLUCOSE 133 (H) 09/29/2021   CHOL 136 04/06/2021   TRIG 124.0 04/06/2021   HDL 33.40 (L) 04/06/2021   LDLDIRECT 136.8 12/12/2006   LDLCALC 78 04/06/2021   ALT 20 09/28/2021   AST 25 09/28/2021   NA 138 09/29/2021   K 4.9 09/29/2021   CL 103 09/29/2021   CREATININE 2.16 (H) 09/29/2021   BUN 40 (H) 09/29/2021   CO2 28 09/29/2021   TSH 3.53 10/07/2021   PSA 1.85 04/06/2021   HGBA1C 7.9 (H) 09/28/2021   MICROALBUR <0.7 12/08/2020    CT Abdomen Pelvis Wo Contrast  Result Date: 09/28/2021 CLINICAL DATA:  Abdominal pain common nausea, vomiting and constipation times 24 hours. Pain along ventral hernia repair site. EXAM: CT ABDOMEN AND PELVIS WITHOUT CONTRAST TECHNIQUE: Multidetector CT imaging of the abdomen and pelvis was performed following the standard protocol without IV contrast. RADIATION DOSE REDUCTION: This exam was performed  according to the departmental dose-optimization program which includes automated exposure control, adjustment of the mA and/or kV according to patient size and/or use of iterative reconstruction technique. COMPARISON:  CT March 30, 2021. FINDINGS: Lower chest: Chronic areas of cylindrical bronchiectasis with thickening of the peribronchovascular interstitium and mild peribronchovascular ground-glass most pronounced in the left lower lobe, appears similar prior Hepatobiliary: Unremarkable noncontrast appearance of the hepatic parenchyma. Cholelithiasis without findings of acute cholecystitis. No biliary ductal dilation. Pancreas: No pancreatic ductal dilation or evidence of acute inflammation. Spleen: Within normal limits. Adrenals/Urinary Tract: Bilateral adrenal glands are unremarkable. No hydronephrosis. No renal, ureteral or bladder calculi identified. Urinary bladder is unremarkable for degree of distension. Stomach/Bowel: No enteric contrast was administered. Unchanged orientation of the gastric band with reservoir in the right paramedian anterior abdominal wall. There is similar fluid collecting about the reservoir and extending along the tubing into the abdomen. Prominent gas and fluid-filled loops of small bowel without pathologic dilation or abrupt transition point. Fluid-filled proximal colon with relatively decompressed sigmoid and descending colon, suggestive of diarrheal illness. Fat and nonobstructed mid transverse colon containing ventral hernia appears similar to prior with a 3.2 cm aperture width. Vascular/Lymphatic: Aortic and branch vessel atherosclerosis without abdominal aortic aneurysm. No pathologically enlarged abdominal or pelvic lymph nodes. Reproductive: Prostate is unremarkable. Other: Cutaneous skin thickening with subcutaneous stranding/fluid along the anterior abdominal wall, are similar prior. Similar skin thickening and granulation tissue along the prior midline ventral incision.  Musculoskeletal: Bilateral proximal femoral fixation hardware. Severe right hip degenerative change is unchanged from prior. No acute osseous abnormality visualized. IMPRESSION: 1. Prominent gas and fluid-filled loops of small bowel without pathologic dilation or abrupt transition, favored to reflect enteritis although early/partial obstruction not excluded. 2. Fluid-filled proximal colon with relatively decompressed sigmoid and descending colon, suggestive of diarrheal illness. 3. Similar appearance of the ventral hernia containing fat and a nonobstructed portion of transverse colon, recommend correlation clinically  for reducibility. 4. Cholelithiasis without findings of acute cholecystitis. 5. Chronic areas of cylindrical bronchiectasis with thickening of the peribronchovascular interstitium and mild peribronchovascular ground-glass most pronounced in the left lower lobe, appears similar prior. 6. Aortic Atherosclerosis (ICD10-I70.0). Electronically Signed   By: [name] M.D.   On: 09/28/2021 18:13    Assessment & Plan:   [name] was seen today for anemia, diabetes and osteoarthritis.  Diagnoses and all orders for this visit:  Class 3 obesity with alveolar hypoventilation, serious comorbidity, and body mass index (BMI) of 50.0 to 59.9 in adult [facility])- He will start using a GLP/GLP agonist.  Insulin-requiring or dependent type II diabetes mellitus ([place])- His recent A1c was up to 7.9%.  Will start a GLP/GIP agonist. -     tirzepatide (MOUNJARO) 2.5 MG/0.5ML Pen; Inject 2.5 mg into the skin once a week. -     HM Diabetes Foot Exam  Diabetic nephropathy associated with type 2 diabetes mellitus ([place])  Diabetic peripheral neuropathy (HCC) -     HYDROcodone-acetaminophen (NORCO) 10-325 MG tablet; Take 1 tablet by mouth every 6 (six) hours as needed for moderate pain.  Acquired hypothyroidism- His TSH is in the normal range. Will continue the current T4 dose. -     TSH; Future -      TSH  Therapeutic opioid-induced constipation (OIC) -     Methylnaltrexone Bromide (RELISTOR) 150 MG TABS; Take 3 tablets by mouth daily.  B12 deficiency -     Folate; Future -     CBC with Differential/Platelet; Future -     cyanocobalamin ((VITAMIN B-12)) injection 1,000 mcg -     CBC with Differential/Platelet -     Folate  Left lumbar radiculopathy -     HYDROcodone-acetaminophen (NORCO) 10-325 MG tablet; Take 1 tablet by mouth every 6 (six) hours as needed for moderate pain.  Primary osteoarthritis involving multiple joints -     HYDROcodone-acetaminophen (NORCO) 10-325 MG tablet; Take 1 tablet by mouth every 6 (six) hours as needed for moderate pain.  Thiamine deficiency -     thiamine (VITAMIN B-1) 100 MG tablet; Take 1 tablet (100 mg total) by mouth daily.  Other iron deficiency anemia- He is symptomatic with this.  Will restart oral iron and I recommended that he receive iron infusions. -     IBC + Ferritin; Future -     Ambulatory referral to Hematology / Oncology -     ferrous sulfate 325 (65 FE) MG tablet; Take 1 tablet (325 mg total) by mouth 2 (two) times daily with a meal.   I have discontinued [name]'s docusate sodium, docusate sodium, and polyethylene glycol. I have also changed his ferrous sulfate. Additionally, I am having him start on tirzepatide and Relistor. Lastly, I am having him maintain his Multiple Vitamins-Minerals (MULTIVITAMIN ADULT PO), cyanocobalamin, Cholecalciferol, aspirin EC, Gvoke HypoPen 2-Pack, Accu-Chek Aviva Plus, blood glucose meter kit and supplies, Accu-Chek Softclix Lancets, Accu-Chek Aviva Connect, Accu-Chek Guide, Synjardy, ProAir RespiClick, atorvastatin, Levemir FlexTouch, bisoprolol, Restasis, levothyroxine, blood glucose meter kit and supplies, UltiCare Short Pen Needles, FreeStyle Libre 2 Sensor, [employer] 2 Reader, furosemide, potassium chloride SA, nystatin, HYDROcodone-acetaminophen, and thiamine. We administered  cyanocobalamin.  Meds ordered this encounter  Medications   tirzepatide (MOUNJARO) 2.5 MG/0.5ML Pen    Sig: Inject 2.5 mg into the skin once a week.    Dispense:  2 mL    Refill:  0   cyanocobalamin ((VITAMIN B-12)) injection 1,000 mcg   Methylnaltrexone Bromide (  RELISTOR) 150 MG TABS    Sig: Take 3 tablets by mouth daily.    Dispense:  270 tablet    Refill:  1   HYDROcodone-acetaminophen (NORCO) 10-325 MG tablet    Sig: Take 1 tablet by mouth every 6 (six) hours as needed for moderate pain.    Dispense:  65 tablet    Refill:  0   thiamine (VITAMIN B-1) 100 MG tablet    Sig: Take 1 tablet (100 mg total) by mouth daily.    Dispense:  90 tablet    Refill:  1   ferrous sulfate 325 (65 FE) MG tablet    Sig: Take 1 tablet (325 mg total) by mouth 2 (two) times daily with a meal.    Dispense:  180 tablet    Refill:  1     Follow-up: Return in about 3 months (around 01/05/2022).  Scarlette Calico, MD

## 2021-10-07 NOTE — Patient Instructions (Signed)
Vitamin B12 Deficiency ?Vitamin B12 deficiency occurs when the body does not have enough vitamin B12, which is an important vitamin. The body needs this vitamin: ?To make red blood cells. ?To make DNA. This is the genetic material inside cells. ?To help the nerves work properly so they can carry messages from the brain to the body. ?Vitamin B12 deficiency can cause various health problems, such as a low red blood cell count (anemia) or nerve damage. ?What are the causes? ?This condition may be caused by: ?Not eating enough foods that contain vitamin B12. ?Not having enough stomach acid and digestive fluids to properly absorb vitamin B12 from the food that you eat. ?Certain digestive system diseases that make it hard to absorb vitamin B12. These diseases include Crohn's disease, chronic pancreatitis, and cystic fibrosis. ?A condition in which the body does not make enough of a protein (intrinsic factor), resulting in too few red blood cells (pernicious anemia). ?Having a surgery in which part of the stomach or small intestine is removed. ?Taking certain medicines that make it hard for the body to absorb vitamin B12. These medicines include: ?Heartburn medicines (antacids and proton pump inhibitors). ?Certain antibiotic medicines. ?Some medicines that are used to treat diabetes, tuberculosis, gout, or high cholesterol. ?What increases the risk? ?The following factors may make you more likely to develop a B12 deficiency: ?Being older than age 50. ?Eating a vegetarian or vegan diet, especially while you are pregnant. ?Eating a poor diet while you are pregnant. ?Taking certain medicines. ?Having alcoholism. ?What are the signs or symptoms? ?In some cases, there are no symptoms of this condition. If the condition leads to anemia or nerve damage, various symptoms can occur, such as: ?Weakness. ?Fatigue. ?Loss of appetite. ?Weight loss. ?Numbness or tingling in your hands and feet. ?Redness and burning of the  tongue. ?Confusion or memory problems. ?Depression. ?Sensory problems, such as color blindness, ringing in the ears, or loss of taste. ?Diarrhea or constipation. ?Trouble walking. ?If anemia is severe, symptoms can include: ?Shortness of breath. ?Dizziness. ?Rapid heart rate (tachycardia). ?How is this diagnosed? ?This condition may be diagnosed with a blood test to measure the level of vitamin B12 in your blood. You may also have other tests, including: ?A group of tests that measure certain characteristics of blood cells (complete blood count, CBC). ?A blood test to measure intrinsic factor. ?A procedure where a thin tube with a camera on the end is used to look into your stomach or intestines (endoscopy). ?Other tests may be needed to discover the cause of B12 deficiency. ?How is this treated? ?Treatment for this condition depends on the cause. This condition may be treated by: ?Changing your eating and drinking habits, such as: ?Eating more foods that contain vitamin B12. ?Drinking less alcohol or no alcohol. ?Getting vitamin B12 injections. ?Taking vitamin B12 supplements. Your health care provider will tell you which dosage is best for you. ?Follow these instructions at home: ?Eating and drinking ? ?Eat lots of healthy foods that contain vitamin B12, including: ?Meats and poultry. This includes beef, pork, chicken, turkey, and organ meats, such as liver. ?Seafood. This includes clams, rainbow trout, salmon, tuna, and haddock. ?Eggs. ?Cereal and dairy products that are fortified. This means that vitamin B12 has been added to the food. Check the label on the package to see if the food is fortified. ?The items listed above may not be a complete list of recommended foods and beverages. Contact a dietitian for more information. ?General instructions ?Get any   injections that are prescribed by your health care provider. ?Take supplements only as told by your health care provider. Follow the directions carefully. ?Do  not drink alcohol if your health care provider tells you not to. In some cases, you may only be asked to limit alcohol use. ?Keep all follow-up visits as told by your health care provider. This is important. ?Contact a health care provider if: ?Your symptoms come back. ?Get help right away if you: ?Develop shortness of breath. ?Have a rapid heart rate. ?Have chest pain. ?Become dizzy or lose consciousness. ?Summary ?Vitamin B12 deficiency occurs when the body does not have enough vitamin B12. ?The main causes of vitamin B12 deficiency include dietary deficiency, digestive diseases, pernicious anemia, and having a surgery in which part of the stomach or small intestine is removed. ?In some cases, there are no symptoms of this condition. If the condition leads to anemia or nerve damage, various symptoms can occur, such as weakness, shortness of breath, and numbness. ?Treatment may include getting vitamin B12 injections or taking vitamin B12 supplements. Eat lots of healthy foods that contain vitamin B12. ?This information is not intended to replace advice given to you by your health care provider. Make sure you discuss any questions you have with your health care provider. ?Document Revised: 03/03/2021 Document Reviewed: 05/15/2018 ?Elsevier Patient Education ? 2022 Elsevier Inc. ? ?

## 2021-10-08 ENCOUNTER — Telehealth: Payer: Self-pay

## 2021-10-08 NOTE — Telephone Encounter (Signed)
approved through 04/07/2022.

## 2021-10-08 NOTE — Telephone Encounter (Signed)
Key: [id number]

## 2021-10-11 ENCOUNTER — Telehealth (HOSPITAL_COMMUNITY): Payer: Self-pay

## 2021-10-11 NOTE — Telephone Encounter (Signed)
[name] returned my call in regards to paramedicine visits and reports he is having trouble regulating his blood sugars.   He reports that he does not have a working glucometer at home and he is wanting a freestyle device to help him regulate his blood sugars. He reported that last night and on 10/05/21 he had what is described as a hypoglycemic episode with confusion, cold sweats, and tremors. He drank orange juice and the symptoms resolved. He did not know what his blood sugar was at the time as he does not have a working meter and he elected not to call 911. I advised him when this happens to reach out to me or call EMS as this could be very dangerous. He verbalized understanding.   Today [name] called requesting the Freestyle, I advised him I am not able to request prescriptions but I can forward the call to his PCP.   He states he wants to be followed by an endocrinologist to help with his diabetes management. I advised him I would forward this request to his PCP as well.   I plan to follow up with [name] through the [facility] with paramedicine.   Call complete.

## 2021-10-12 ENCOUNTER — Other Ambulatory Visit: Payer: Self-pay | Admitting: Internal Medicine

## 2021-10-12 DIAGNOSIS — Z794 Long term (current) use of insulin: Secondary | ICD-10-CM

## 2021-10-12 DIAGNOSIS — E118 Type 2 diabetes mellitus with unspecified complications: Secondary | ICD-10-CM

## 2021-10-12 MED ORDER — LEVEMIR FLEXTOUCH 100 UNIT/ML ~~LOC~~ SOPN: 200.0000 [IU] | PEN_INJECTOR | Freq: Every day | SUBCUTANEOUS | 5 refills | Status: DC

## 2021-10-13 ENCOUNTER — Telehealth: Payer: Self-pay | Admitting: Physician Assistant

## 2021-10-13 NOTE — Telephone Encounter (Signed)
Scheduled appt per 1/19 referral. Spoke to pt who is aware of appt date and time. Pt is aware to arrive 15 mins prior to appt time.  

## 2021-10-18 ENCOUNTER — Other Ambulatory Visit: Payer: Self-pay | Admitting: Internal Medicine

## 2021-10-18 DIAGNOSIS — E039 Hypothyroidism, unspecified: Secondary | ICD-10-CM

## 2021-10-18 DIAGNOSIS — E785 Hyperlipidemia, unspecified: Secondary | ICD-10-CM

## 2021-10-22 ENCOUNTER — Telehealth: Payer: Self-pay

## 2021-10-22 NOTE — Telephone Encounter (Signed)
Call to pt reference next PREP class starting at [facility] on 11/08/21  Currently exercising over near the [facility] doing a pool workout for about 20 min-walks in pool for 10 min 2x Able to reach their easier than coming to [facility] Asked that he give me a call when he is ready to join PREP at the [facility]

## 2021-10-25 ENCOUNTER — Telehealth (HOSPITAL_COMMUNITY): Payer: Self-pay

## 2021-10-25 NOTE — Telephone Encounter (Signed)
Called Mr. Depaul to see if he received his freestyle from his pharmacy and he stated he has not. I called [facility] and they advised they are unable to fill it and they reported it has to be sent to a [facility] under Medicare Part B. Please send this to [facility] on [address]. Thank you!

## 2021-10-26 ENCOUNTER — Other Ambulatory Visit: Payer: Self-pay | Admitting: Internal Medicine

## 2021-10-26 DIAGNOSIS — E1165 Type 2 diabetes mellitus with hyperglycemia: Secondary | ICD-10-CM

## 2021-10-26 DIAGNOSIS — E118 Type 2 diabetes mellitus with unspecified complications: Secondary | ICD-10-CM

## 2021-10-26 DIAGNOSIS — Z794 Long term (current) use of insulin: Secondary | ICD-10-CM

## 2021-10-26 DIAGNOSIS — E119 Type 2 diabetes mellitus without complications: Secondary | ICD-10-CM

## 2021-10-26 MED ORDER — ACCU-CHEK GUIDE W/DEVICE KIT: 1.0000 | PACK | Freq: Four times a day (QID) | 3 refills | Status: DC

## 2021-10-26 MED ORDER — BLOOD GLUCOSE METER KIT: PACK | 0 refills | Status: AC

## 2021-10-26 MED ORDER — ACCU-CHEK SOFTCLIX LANCETS MISC: 3 refills | Status: AC

## 2021-10-26 MED ORDER — BLOOD GLUCOSE MONITOR KIT: 1.0000 | PACK | Freq: Four times a day (QID) | 1 refills | Status: AC

## 2021-10-26 MED ORDER — ACCU-CHEK AVIVA CONNECT W/DEVICE KIT: 1.0000 | PACK | Freq: Three times a day (TID) | 3 refills | Status: AC | PRN

## 2021-10-27 ENCOUNTER — Telehealth (HOSPITAL_COMMUNITY): Payer: Self-pay

## 2021-10-27 NOTE — Telephone Encounter (Signed)
Attempted to call [name] for home paramedicine visit. I left a voicemail and a text also reminding him of hi upcoming appointment with Dr. Joya Gaskins on Monday. I will continue to try to reach out.

## 2021-10-31 NOTE — Progress Notes (Incomplete)
New Patient Office Visit  Subjective:  Patient ID: [name], male    DOB: Mar 19, 1956  Age: 66 y.o. MRN: [id number]  CC: No chief complaint on file.   HPI [name] presents for  Class 3 obesity with alveolar hypoventilation, serious comorbidity, and body mass index (BMI) of 50.0 to 59.9 in adult [facility])- He will start using a GLP/GLP agonist.   Insulin-requiring or dependent type II diabetes mellitus ([place])- His recent A1c was up to 7.9%.  Will start a GLP/GIP agonist. -     tirzepatide (MOUNJARO) 2.5 MG/0.5ML Pen; Inject 2.5 mg into the skin once a week. -     HM Diabetes Foot Exam   Diabetic nephropathy associated with type 2 diabetes mellitus ([place])   Diabetic peripheral neuropathy (HCC) -     HYDROcodone-acetaminophen (NORCO) 10-325 MG tablet; Take 1 tablet by mouth every 6 (six) hours as needed for moderate pain.   Acquired hypothyroidism- His TSH is in the normal range. Will continue the current T4 dose. -     TSH; Future -     TSH   Therapeutic opioid-induced constipation (OIC) -     Methylnaltrexone Bromide (RELISTOR) 150 MG TABS; Take 3 tablets by mouth daily.   B12 deficiency -     Folate; Future -     CBC with Differential/Platelet; Future -     cyanocobalamin ((VITAMIN B-12)) injection 1,000 mcg -     CBC with Differential/Platelet -     Folate   Left lumbar radiculopathy -     HYDROcodone-acetaminophen (NORCO) 10-325 MG tablet; Take 1 tablet by mouth every 6 (six) hours as needed for moderate pain.   Primary osteoarthritis involving multiple joints -     HYDROcodone-acetaminophen (NORCO) 10-325 MG tablet; Take 1 tablet by mouth every 6 (six) hours as needed for moderate pain.   Thiamine deficiency -     thiamine (VITAMIN B-1) 100 MG tablet; Take 1 tablet (100 mg total) by mouth daily.   Other iron deficiency anemia- He is symptomatic with this.  Will restart oral iron and I recommended that he receive iron infusions. -     IBC + Ferritin; Future -      Ambulatory referral to Hematology / Oncology -     ferrous sulfate 325 (65 FE) MG tablet; Take 1 tablet (325 mg total) by mouth 2 (two) times daily with a meal.     I have discontinued Neven Baine's docusate sodium, docusate sodium, and polyethylene glycol. I have also changed his ferrous sulfate. Additionally, I am having him start on tirzepatide and Relistor. Lastly, I am having him maintain his Multiple Vitamins-Minerals (MULTIVITAMIN ADULT PO), cyanocobalamin, Cholecalciferol, aspirin EC, Gvoke HypoPen 2-Pack, Accu-Chek Aviva Plus, blood glucose meter kit and supplies, Accu-Chek Softclix Lancets, Accu-Chek Aviva Connect, Accu-Chek Guide, Synjardy, ProAir RespiClick, atorvastatin, Levemir FlexTouch, bisoprolol, Restasis, levothyroxine, blood glucose meter kit and supplies, UltiCare Short Pen Needles, FreeStyle Libre 2 Sensor, [employer] 2 Reader, furosemide, potassium chloride SA, nystatin, HYDROcodone-acetaminophen, and thiamine. We administered cyanocobalamin  Past Medical History:  Diagnosis Date   Anemia    iron   CHF (congestive heart failure) (HCC)    Presumed diastolic. Echo (06/07) w.EF 45%, severe posterior HK, mild LV hypertrophy, No further work-up of abnormal echo was done. pt denies CHF   Colon cancer [facility]) 2007   s/p sigmoid colectomy   Diabetes mellitus    Has Hx of diabetic foot ulcer & peripheral neuropathy  type2   Dyspnea  w/ activity   History of PFTs 05/2009   Mild Obstructive defect   HTN (hypertension)    Hyperlipidemia    Morbid obesity (HCC)    OSA on CPAP    Pneumonia    week ago     Past Surgical History:  Procedure Laterality Date   BARIATRIC SURGERY  12/2009   Lap Band/At [facility]   COLONOSCOPY  multiple   COLONOSCOPY WITH PROPOFOL N/A 11/26/2012   Procedure: COLONOSCOPY WITH PROPOFOL;  Surgeon: Gatha Mayer, MD;  Location: [facility];  Service: Endoscopy;  Laterality: N/A;  may need pre appt. with anesthesia due to morbid  obesity   COLONOSCOPY WITH PROPOFOL N/A 07/11/2017   Procedure: COLONOSCOPY WITH PROPOFOL;  Surgeon: Gatha Mayer, MD;  [facility];  Service: Endoscopy;  Laterality: N/A;   [place]   '80/Right  '84/Left   EYE SURGERY Right 05/2017   HERNIA REPAIR     HIP SURGERY     bilateral   INCISIONAL HERNIA REPAIR N/A 12/18/2013   Procedure:  REPAIR OF INCARCERATED INCISIONAL HERNIA;  Surgeon: Rolm Bookbinder, MD;  Location: [place] [facility];  Service: General;  Laterality: N/A;   INSERTION OF MESH N/A 12/18/2013   Procedure: INSERTION OF MESH;  Surgeon: Rolm Bookbinder, MD;  Location: [place];  Service: General;  Laterality: N/A;   Sigmoid Colectomy  10/2005   [place]   TONSILLECTOMY      Family History  Problem Relation Age of Onset   Hepatitis Mother        hepatitis C   Diabetes Mother    Congestive Heart Failure Mother    CAD Mother    Heart attack Brother 33   Diabetes Father    Heart failure Brother    Stomach cancer Neg Hx    Colon cancer Neg Hx     Social History   Socioeconomic History   Marital status: Legally Separated    Spouse name: Not on file   Number of children: 0   Years of education: Not on file   Highest education level: Not on file  Occupational History   Occupation: Human resources officer  Tobacco Use   Smoking status: Never   Smokeless tobacco: Never  Vaping Use   Vaping Use: Never used  Substance and Sexual Activity   Alcohol use: Not Currently    Alcohol/week: 0.0 standard drinks    Comment: states he does not drink    Drug use: No   Sexual activity: Not Currently  Other Topics Concern   Not on file  Social History Narrative   HSG, 2 years of [place]   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   In new relationship (04/2009) - girlfriend helps  taste for cooking business and helps monitor for ulcers.   Regular Exercise- No      Social Determinants of Health   Financial Resource Strain: Low Risk    Difficulty of Paying Living Expenses: Not very hard  Food Insecurity: No Food Insecurity   Worried About Running Out of Food in the Last Year: Never true   Ran Out of Food in the Last Year: Never true  Transportation Needs: No Transportation Needs   Lack of Transportation (Medical): No   Lack of Transportation (Non-Medical): No  Physical Activity:  Inactive   Days of Exercise per Week: 0 days   Minutes of Exercise per Session: 0 min  Stress: Not on file  Social Connections: Socially Isolated   Frequency of Communication with Friends and Family: More than three times a week   Frequency of Social Gatherings with Friends and Family: Once a week   Attends Religious Services: Never   Marine scientist or Organizations: No   Attends Archivist Meetings: Never   Marital Status: Never married  Human resources officer Violence: Not on file    ROS Review of Systems  Objective:   Today's Vitals: There were no vitals taken for this visit.  Physical Exam  CT Abd 09/28/21  IMPRESSION: 1. Prominent gas and fluid-filled loops of small bowel without pathologic dilation or abrupt transition, favored to reflect enteritis although early/partial obstruction not excluded. 2. Fluid-filled proximal colon with relatively decompressed sigmoid and descending colon, suggestive of diarrheal illness. 3. Similar appearance of the ventral hernia containing fat and a nonobstructed portion of transverse colon, recommend correlation clinically for reducibility. 4. Cholelithiasis without findings of acute cholecystitis. 5. Chronic areas of cylindrical bronchiectasis with thickening of the peribronchovascular interstitium and mild peribronchovascular ground-glass most pronounced in the left lower lobe, appears similar prior. 6. Aortic  Atherosclerosis Assessment & Plan:   Problem List Items Addressed This Visit   None   Outpatient Encounter Medications as of 11/01/2021  Medication Sig   Accu-Chek Softclix Lancets lancets CHECK BLOOD SUGAR 2 TIMES  DAILY   Albuterol Sulfate (PROAIR RESPICLICK) 924 (90 Base) MCG/ACT AEPB Inhale 1 puff into the lungs 4 (four) times daily as needed. (Patient taking differently: Inhale 1 puff into the lungs 4 (four) times daily as needed (shortness of breath/wheezing).)   aspirin EC 81 MG tablet Take 1 tablet (81 mg total) by mouth daily.   atorvastatin (LIPITOR) 40 MG tablet TAKE 1 TABLET BY MOUTH DAILY.   bisoprolol (ZEBETA) 10 MG tablet Take 1 tablet (10 mg total) by mouth every evening.   blood glucose meter kit and supplies KIT Inject 1 each into the skin 4 (four) times daily. Use to check blood sugar three times daily. Dispense Freestyle Libre according to insurance preference. DX: E11.8   blood glucose meter kit and supplies Dispense based on patient and insurance preference. Use up to four times daily as directed. (FOR ICD-10 E10.9, E11.9).   Blood Glucose Monitoring Suppl (ACCU-CHEK AVIVA CONNECT) w/Device KIT 1 Act by Does not apply route 3 (three) times daily as needed.   Blood Glucose Monitoring Suppl (ACCU-CHEK GUIDE) w/Device KIT Inject 1 Act into the skin 4 (four) times daily.   Cholecalciferol 50 MCG (2000 UT) TABS Take 1 tablet (2,000 Units total) by mouth daily.   Continuous Blood Gluc Receiver (FREESTYLE LIBRE 2 READER) DEVI 1 Act by Does not apply route daily.   Continuous Blood Gluc Sensor (FREESTYLE LIBRE 2 SENSOR) MISC 1 Act by Does not apply route daily.   cyanocobalamin 2000 MCG tablet Take 1 tablet (2,000 mcg total) by mouth daily.   ferrous sulfate 325 (65 FE) MG tablet Take 1 tablet (325 mg total) by mouth 2 (two) times daily with a meal.   furosemide (LASIX) 20 MG tablet Take 5 tablets (100 mg total) by mouth 2 (two) times daily.   Glucagon (GVOKE  HYPOPEN 2-PACK) 1 MG/0.2ML SOAJ Inject 1 Act into the skin daily as needed.   glucose blood (ACCU-CHEK AVIVA PLUS) test strip Use to test blood sugar twice daily.  DX E11.8   HYDROcodone-acetaminophen (NORCO) 10-325 MG tablet Take 1 tablet by mouth every 6 (six) hours as needed for moderate pain.   insulin detemir (LEVEMIR FLEXTOUCH) 100 UNIT/ML FlexPen Inject 200 Units into the skin daily.   Insulin Pen Needle (ULTICARE SHORT PEN NEEDLES) 31G X 8 MM MISC USE TWO PEN NEEDLES DAILY WITH INSULIN PENS   levothyroxine (SYNTHROID) 50 MCG tablet TAKE 1 TABLET BY MOUTH DAILY BEFORE BREAKFAST.   Methylnaltrexone Bromide (RELISTOR) 150 MG TABS Take 3 tablets by mouth daily.   Multiple Vitamins-Minerals (MULTIVITAMIN ADULT PO) Take 1 tablet by mouth daily.   nystatin (MYCOSTATIN/NYSTOP) powder Apply 1 application topically 3 (three) times daily.   potassium chloride SA (KLOR-CON M) 20 MEQ tablet TAKE 1 TABLET BY MOUTH 2 TIMES DAILY.   RESTASIS 0.05 % ophthalmic emulsion PLACE 1 DROP INTO BOTH EYES TWICE A DAY (Patient taking differently: Place 1 drop into both eyes 2 (two) times daily.)   SYNJARDY 12.01-999 MG TABS TAKE 1 TABLET BY MOUTH  TWICE DAILY   thiamine (VITAMIN B-1) 100 MG tablet Take 1 tablet (100 mg total) by mouth daily.   tirzepatide [facility]) 2.5 MG/0.5ML Pen Inject 2.5 mg into the skin once a week.   No facility-administered encounter medications on file as of 11/01/2021.    Follow-up: No follow-ups on file.   Asencion Noble, MD

## 2021-11-01 ENCOUNTER — Telehealth: Payer: Self-pay | Admitting: Internal Medicine

## 2021-11-01 ENCOUNTER — Ambulatory Visit: Payer: Commercial Managed Care - HMO | Admitting: Critical Care Medicine

## 2021-11-01 NOTE — Telephone Encounter (Signed)
Pt requesting an order for an oxygen tank, pt states he was diagnose w/ sleep apnea but does not know the date of diagnosis, pt states he becomes sob at night while sleeping  Pt states he's having another sleep study administered in March 2023

## 2021-11-02 NOTE — Progress Notes (Unsigned)
[place] [phone]   [phone] NOTE  Patient Care Team: [name], MD as PCP - General (Internal Medicine) Larey Dresser, MD as PCP - Advanced Heart Failure (Cardiology) Gatha Mayer, MD (Gastroenterology) Charlton Haws, [facility] as Pharmacist (Pharmacist)  Hematological/Oncological History # ***  CHIEF COMPLAINTS/PURPOSE OF CONSULTATION:  "*** "  HISTORY OF PRESENTING ILLNESS:  [name] 66 y.o. male with medical history significant for ***  On review of the previous records ***  On exam today ***  MEDICAL HISTORY:  Past Medical History:  Diagnosis Date   Anemia    iron   CHF (congestive heart failure) ([place])    Presumed diastolic. Echo (06/07) w.EF 45%, severe posterior HK, mild LV hypertrophy, No further work-up of abnormal echo was done. pt denies CHF   Colon cancer ([place]) 2007   s/p sigmoid colectomy   Diabetes mellitus    Has Hx of diabetic foot ulcer & peripheral neuropathy  type2   Dyspnea    w/ activity   History of PFTs 05/2009   Mild Obstructive defect   HTN (hypertension)    Hyperlipidemia    Morbid obesity (HCC)    OSA on CPAP    Pneumonia    week ago     SURGICAL HISTORY: Past Surgical History:  Procedure Laterality Date   BARIATRIC SURGERY  12/2009   Lap Band/At [facility]   COLONOSCOPY  multiple   COLONOSCOPY WITH PROPOFOL N/A 11/26/2012   Procedure: COLONOSCOPY WITH PROPOFOL;  Surgeon: Gatha Mayer, MD;  [facility];  Service: Endoscopy;  Laterality: N/A;  may need pre appt. with anesthesia due to morbid obesity   COLONOSCOPY WITH PROPOFOL N/A 07/11/2017   Procedure: COLONOSCOPY WITH PROPOFOL;  Surgeon: Gatha Mayer, MD;  [facility];  Service: Endoscopy;  Laterality: N/A;   [place]   '80/Right  '84/Left   EYE SURGERY Right 05/2017   HERNIA REPAIR     HIP SURGERY     bilateral   INCISIONAL HERNIA REPAIR N/A 12/18/2013    Procedure:  REPAIR OF INCARCERATED INCISIONAL HERNIA;  Surgeon: Rolm Bookbinder, MD;  Location: [place];  Service: General;  Laterality: N/A;   INSERTION OF MESH N/A 12/18/2013   Procedure: INSERTION OF MESH;  Surgeon: Rolm Bookbinder, MD;  Location: [place];  Service: General;  Laterality: N/A;   Sigmoid Colectomy  10/2005   [place]   TONSILLECTOMY      SOCIAL HISTORY: Social History   Socioeconomic History   Marital status: Legally Separated    Spouse name: Not on file   Number of children: 0   Years of education: Not on file   Highest education level: Not on file  Occupational History   Occupation: Human resources officer  Tobacco Use   Smoking status: Never   Smokeless tobacco: Never  Vaping Use   Vaping Use: Never used  Substance and Sexual Activity   Alcohol use: Not Currently    Alcohol/week: 0.0 standard drinks    Comment: states he does not drink    Drug use: No   Sexual activity: Not Currently  Other Topics Concern   Not on file  Social History Narrative   HSG, 2 years of [place]   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   In new relationship (04/2009) - girlfriend helps taste for cooking business and helps monitor for ulcers.   Regular Exercise- No      Social Determinants of Health   Financial Resource Strain: Low Risk    Difficulty of Paying Living Expenses: Not very hard  Food Insecurity: No Food Insecurity   Worried About Running Out of Food in the Last Year: Never true   Ran Out of Food in the Last Year: Never true  Transportation Needs: No Transportation Needs   Lack of Transportation (Medical): No   Lack of Transportation (Non-Medical): No  Physical Activity: Inactive   Days of Exercise per Week: 0 days   Minutes of Exercise per Session: 0 min  Stress: Not on file  Social Connections: Socially  Isolated   Frequency of Communication with Friends and Family: More than three times a week   Frequency of Social Gatherings with Friends and Family: Once a week   Attends Religious Services: Never   Marine scientist or Organizations: No   Attends Music therapist: Never   Marital Status: Never married  Human resources officer Violence: Not on file    FAMILY HISTORY: Family History  Problem Relation Age of Onset   Hepatitis Mother        hepatitis C   Diabetes Mother    Congestive Heart Failure Mother    CAD Mother    Heart attack Brother 77   Diabetes Father    Heart failure Brother    Stomach cancer Neg Hx    Colon cancer Neg Hx     ALLERGIES:  is allergic to shellfish allergy, lisinopril, and lovastatin.  MEDICATIONS:  Current Outpatient Medications  Medication Sig Dispense Refill   Accu-Chek Softclix Lancets lancets CHECK BLOOD SUGAR 2 TIMES  DAILY 300 each 3   Albuterol Sulfate (PROAIR RESPICLICK) 629 (90 Base) MCG/ACT AEPB Inhale 1 puff into the lungs 4 (four) times daily as needed. (Patient taking differently: Inhale 1 puff into the lungs 4 (four) times daily as needed (shortness of breath/wheezing).) 3 each 1   aspirin EC 81 MG tablet Take 1 tablet (81 mg total) by mouth daily. 90 tablet 1   atorvastatin (LIPITOR) 40 MG tablet TAKE 1 TABLET BY MOUTH DAILY. 90 tablet 1   bisoprolol (ZEBETA) 10 MG tablet Take 1 tablet (10 mg total) by mouth every evening. 90 tablet 1   blood glucose meter kit and supplies KIT Inject 1 each into the skin 4 (four) times daily. Use to check blood sugar three times daily. Dispense Freestyle Libre according to insurance preference. DX: E11.8 2 each 1   blood glucose meter kit and supplies Dispense based on patient and insurance preference. Use up to four times daily as directed. (FOR ICD-10 E10.9, E11.9). 1 each 0   Blood Glucose Monitoring Suppl (ACCU-CHEK AVIVA CONNECT) w/Device KIT 1 Act by Does not apply route 3 (three) times  daily as needed. 1 kit 3   Blood Glucose Monitoring Suppl (ACCU-CHEK GUIDE) w/Device KIT Inject 1 Act into the skin 4 (four) times daily. 1 kit 3   Cholecalciferol 50 MCG (2000 UT) TABS Take 1 tablet (2,000 Units total) by mouth daily. 90 tablet 1   Continuous Blood Gluc Receiver (FREESTYLE LIBRE 2 READER) DEVI 1 Act by Does not apply route daily. 2 each 5   Continuous Blood Gluc Sensor (FREESTYLE LIBRE 2 SENSOR) MISC 1 Act by Does not apply route daily. 2 each  5   cyanocobalamin 2000 MCG tablet Take 1 tablet (2,000 mcg total) by mouth daily. 90 tablet 1   ferrous sulfate 325 (65 FE) MG tablet Take 1 tablet (325 mg total) by mouth 2 (two) times daily with a meal. 180 tablet 1   furosemide (LASIX) 20 MG tablet Take 5 tablets (100 mg total) by mouth 2 (two) times daily. 300 tablet 3   Glucagon (GVOKE HYPOPEN 2-PACK) 1 MG/0.2ML SOAJ Inject 1 Act into the skin daily as needed. 2 mL 5   glucose blood (ACCU-CHEK AVIVA PLUS) test strip Use to test blood sugar twice daily. DX E11.8 200 strip 3   HYDROcodone-acetaminophen (NORCO) 10-325 MG tablet Take 1 tablet by mouth every 6 (six) hours as needed for moderate pain. 65 tablet 0   insulin detemir (LEVEMIR FLEXTOUCH) 100 UNIT/ML FlexPen Inject 200 Units into the skin daily. 75 mL 5   Insulin Pen Needle (ULTICARE SHORT PEN NEEDLES) 31G X 8 MM MISC USE TWO PEN NEEDLES DAILY WITH INSULIN PENS 200 each 2   levothyroxine (SYNTHROID) 50 MCG tablet TAKE 1 TABLET BY MOUTH DAILY BEFORE BREAKFAST. 90 tablet 0   Methylnaltrexone Bromide (RELISTOR) 150 MG TABS Take 3 tablets by mouth daily. 270 tablet 1   Multiple Vitamins-Minerals (MULTIVITAMIN ADULT PO) Take 1 tablet by mouth daily.     nystatin (MYCOSTATIN/NYSTOP) powder Apply 1 application topically 3 (three) times daily. 60 g 5   potassium chloride SA (KLOR-CON M) 20 MEQ tablet TAKE 1 TABLET BY MOUTH 2 TIMES DAILY. 180 tablet 3   RESTASIS 0.05 % ophthalmic emulsion PLACE 1 DROP INTO BOTH EYES TWICE A DAY (Patient  taking differently: Place 1 drop into both eyes 2 (two) times daily.) 60 each 5   SYNJARDY 12.01-999 MG TABS TAKE 1 TABLET BY MOUTH  TWICE DAILY 180 tablet 1   thiamine (VITAMIN B-1) 100 MG tablet Take 1 tablet (100 mg total) by mouth daily. 90 tablet 1   tirzepatide (MOUNJARO) 2.5 MG/0.5ML Pen Inject 2.5 mg into the skin once a week. 2 mL 0   No current facility-administered medications for this visit.    REVIEW OF SYSTEMS:   Constitutional: ( - ) fevers, ( - )  chills , ( - ) night sweats Eyes: ( - ) blurriness of vision, ( - ) double vision, ( - ) watery eyes Ears, nose, mouth, throat, and face: ( - ) mucositis, ( - ) sore throat Respiratory: ( - ) cough, ( - ) dyspnea, ( - ) wheezes Cardiovascular: ( - ) palpitation, ( - ) chest discomfort, ( - ) lower extremity swelling Gastrointestinal:  ( - ) nausea, ( - ) heartburn, ( - ) change in bowel habits Skin: ( - ) abnormal skin rashes Lymphatics: ( - ) new lymphadenopathy, ( - ) easy bruising Neurological: ( - ) numbness, ( - ) tingling, ( - ) new weaknesses Behavioral/Psych: ( - ) mood change, ( - ) new changes  All other systems were reviewed with the patient and are negative.  PHYSICAL EXAMINATION: ECOG PERFORMANCE STATUS: {CHL ONC ECOG PS:[id number]}  There were no vitals filed for this visit. There were no vitals filed for this visit.  GENERAL: well appearing *** in NAD  SKIN: skin color, texture, turgor are normal, no rashes or significant lesions EYES: conjunctiva are pink and non-injected, sclera clear OROPHARYNX: no exudate, no erythema; lips, buccal mucosa, and tongue normal  NECK: supple, non-tender LYMPH:  no palpable lymphadenopathy in the cervical, axillary or  supraclavicular lymph nodes.  LUNGS: clear to auscultation and percussion with normal breathing effort HEART: regular rate & rhythm and no murmurs and no lower extremity edema ABDOMEN: soft, non-tender, non-distended, normal bowel sounds Musculoskeletal: no  cyanosis of digits and no clubbing  PSYCH: alert & oriented x 3, fluent speech NEURO: no focal motor/sensory deficits  LABORATORY DATA:  I have reviewed the data as listed CBC Latest Ref Rng & Units 10/07/2021 09/28/2021 03/29/2021  WBC 4.0 - 10.5 K/uL 6.7 9.2 8.9  Hemoglobin 13.0 - 17.0 g/dL 10.2(L) 12.6(L) 12.0(L)  Hematocrit 39.0 - 52.0 % 33.4(L) 42.6 40.1  Platelets 150.0 - 400.0 K/uL 212.0 263 225    CMP Latest Ref Rng & Units 09/29/2021 09/28/2021 08/10/2021  Glucose 70 - 99 mg/dL 133(H) 171(H) 59(L)  BUN 8 - 23 mg/dL 40(H) 39(H) 28(H)  Creatinine 0.61 - 1.24 mg/dL 2.16(H) 2.21(H) 1.89(H)  Sodium 135 - 145 mmol/L 138 139 141  Potassium 3.5 - 5.1 mmol/L 4.9 5.8(H) 4.4  Chloride 98 - 111 mmol/L 103 96(L) 102  CO2 22 - 32 mmol/L 28 32 33(H)  Calcium 8.9 - 10.3 mg/dL 8.5(L) 9.6 8.8(L)  Total Protein 6.5 - 8.1 g/dL - 9.3(H) -  Total Bilirubin 0.3 - 1.2 mg/dL - 0.6 -  Alkaline Phos 38 - 126 U/L - 91 -  AST 15 - 41 U/L - 25 -  ALT 0 - 44 U/L - 20 -     PATHOLOGY: ***  BLOOD FILM: *** Review of the peripheral blood smear showed normal appearing white cells with neutrophils that were appropriately lobated and granulated. There was no predominance of bi-lobed or hyper-segmented neutrophils appreciated. No Dohle bodies were noted. There was no left shifting, immature forms or blasts noted. Lymphocytes remain normal in size without any predominance of large granular lymphocytes. Red cells show no anisopoikilocytosis, macrocytes , microcytes or polychromasia. There were no schistocytes, target cells, echinocytes, acanthocytes, dacrocytes, or stomatocytes.There was no rouleaux formation, nucleated red cells, or intra-cellular inclusions noted. The platelets are normal in size, shape, and color without any clumping evident.  RADIOGRAPHIC STUDIES: I have personally reviewed the radiological images as listed and agreed with the findings in the report. No results found.  ASSESSMENT &  PLAN ***  No orders of the defined types were placed in this encounter.   All questions were answered. The patient knows to call the clinic with any problems, questions or concerns.  I have spent a total of {CHL ONC TIME VISIT - [id number]} minutes of face-to-face and non-face-to-face time, preparing to see the patient, obtaining and/or reviewing separately obtained history, performing a medically appropriate examination, counseling and educating the patient, ordering medications/tests/procedures, referring and communicating with other health care professionals, documenting clinical information in the electronic health record, independently interpreting results and communicating results to the patient, and care coordination.   Dede Query, PA-C Department of Hematology/Oncology [place] at [facility] Phone: [phone]

## 2021-11-03 ENCOUNTER — Inpatient Hospital Stay: Payer: Medicare Other | Attending: Physician Assistant | Admitting: Physician Assistant

## 2021-11-03 ENCOUNTER — Inpatient Hospital Stay: Payer: Medicare Other

## 2021-11-03 NOTE — Telephone Encounter (Signed)
Pt checking status of request, informed pt provider is out of the office this week, his request has been routed and provider will respond upon his return

## 2021-11-07 ENCOUNTER — Other Ambulatory Visit: Payer: Self-pay | Admitting: Internal Medicine

## 2021-11-07 DIAGNOSIS — G4733 Obstructive sleep apnea (adult) (pediatric): Secondary | ICD-10-CM

## 2021-11-08 ENCOUNTER — Telehealth: Payer: Self-pay | Admitting: Internal Medicine

## 2021-11-08 NOTE — Telephone Encounter (Signed)
LVM stating pulmonary referral was entered.

## 2021-11-08 NOTE — Telephone Encounter (Signed)
Left message for patient to call back to schedule Medicare Annual Wellness Visit   Last AWV  11/06/20  Please schedule at anytime with [facility] if patient calls the office back.    40 Minutes appointment   Any questions, please call me at [phone]

## 2021-11-09 ENCOUNTER — Telehealth (HOSPITAL_COMMUNITY): Payer: Self-pay

## 2021-11-09 DIAGNOSIS — G4733 Obstructive sleep apnea (adult) (pediatric): Secondary | ICD-10-CM | POA: Diagnosis not present

## 2021-11-09 DIAGNOSIS — E0840 Diabetes mellitus due to underlying condition with diabetic neuropathy, unspecified: Secondary | ICD-10-CM | POA: Diagnosis not present

## 2021-11-09 DIAGNOSIS — R609 Edema, unspecified: Secondary | ICD-10-CM | POA: Diagnosis not present

## 2021-11-09 DIAGNOSIS — Z0000 Encounter for general adult medical examination without abnormal findings: Secondary | ICD-10-CM | POA: Diagnosis not present

## 2021-11-09 NOTE — Telephone Encounter (Signed)
Called to speak to Mr. Raz and call went to voicemail. I reminded him of his appointment with HF clinic tomorrow at 0900. I asked him to call me back. I will continue to follow.

## 2021-11-10 ENCOUNTER — Encounter (HOSPITAL_COMMUNITY): Payer: Medicare Other | Admitting: Cardiology

## 2021-11-11 ENCOUNTER — Telehealth (HOSPITAL_COMMUNITY): Payer: Self-pay

## 2021-11-11 NOTE — Telephone Encounter (Signed)
Left message in attempt to reach [name] about home paramedicine visit. We had one successful visit >3 weeks ago but he has been difficult to reach. I have sent text messages as well as phone calls in attempt to reach him. He has also no showed to PCP and HF clinic appointments this month. I will continue with my attempts but will send to HF clinic team for further.

## 2021-11-15 ENCOUNTER — Telehealth (HOSPITAL_COMMUNITY): Payer: Self-pay | Admitting: Licensed Clinical Social Worker

## 2021-11-15 NOTE — Telephone Encounter (Signed)
CSW consulted by [employer] to help contact pt regarding continuing involvement with [employer] and to discuss recent missed appts to assess for barriers to compliance.  CSW attempted to call pt to discuss- went straight to VM so sent text message and email also requesting return call  Will discuss DC at [facility] on Wednesday if have not heard back by that time.  Jorge Ny, LCSW Clinical Social Worker [facility] Desk#: [phone] Cell#: [phone]

## 2021-11-17 DIAGNOSIS — E875 Hyperkalemia: Secondary | ICD-10-CM | POA: Diagnosis not present

## 2021-11-17 DIAGNOSIS — Z79899 Other long term (current) drug therapy: Secondary | ICD-10-CM | POA: Diagnosis not present

## 2021-11-17 DIAGNOSIS — E1142 Type 2 diabetes mellitus with diabetic polyneuropathy: Secondary | ICD-10-CM | POA: Diagnosis not present

## 2021-11-17 DIAGNOSIS — Z1211 Encounter for screening for malignant neoplasm of colon: Secondary | ICD-10-CM | POA: Diagnosis not present

## 2021-11-17 DIAGNOSIS — E039 Hypothyroidism, unspecified: Secondary | ICD-10-CM | POA: Diagnosis not present

## 2021-11-17 DIAGNOSIS — Z1159 Encounter for screening for other viral diseases: Secondary | ICD-10-CM | POA: Diagnosis not present

## 2021-11-17 DIAGNOSIS — Z0001 Encounter for general adult medical examination with abnormal findings: Secondary | ICD-10-CM | POA: Diagnosis not present

## 2021-11-17 DIAGNOSIS — G4733 Obstructive sleep apnea (adult) (pediatric): Secondary | ICD-10-CM | POA: Diagnosis not present

## 2021-11-17 DIAGNOSIS — Z23 Encounter for immunization: Secondary | ICD-10-CM | POA: Diagnosis not present

## 2021-11-17 DIAGNOSIS — E1121 Type 2 diabetes mellitus with diabetic nephropathy: Secondary | ICD-10-CM | POA: Diagnosis not present

## 2021-11-18 ENCOUNTER — Telehealth (HOSPITAL_COMMUNITY): Payer: Self-pay | Admitting: Licensed Clinical Social Worker

## 2021-11-18 NOTE — Telephone Encounter (Signed)
Pt being DC'd from [employer] after multiple attempts to contact pt over the past month with no response. ? ?Jorge Ny, LCSW ?Clinical Social Worker ?[facility] ?Desk#: [phone] ?Cell#: [phone] ? ?

## 2021-11-22 DIAGNOSIS — E119 Type 2 diabetes mellitus without complications: Secondary | ICD-10-CM | POA: Diagnosis not present

## 2021-11-23 DIAGNOSIS — I129 Hypertensive chronic kidney disease with stage 1 through stage 4 chronic kidney disease, or unspecified chronic kidney disease: Secondary | ICD-10-CM | POA: Diagnosis not present

## 2021-11-23 DIAGNOSIS — E1122 Type 2 diabetes mellitus with diabetic chronic kidney disease: Secondary | ICD-10-CM | POA: Diagnosis not present

## 2021-11-23 DIAGNOSIS — N1832 Chronic kidney disease, stage 3b: Secondary | ICD-10-CM | POA: Diagnosis not present

## 2021-11-23 DIAGNOSIS — E119 Type 2 diabetes mellitus without complications: Secondary | ICD-10-CM | POA: Diagnosis not present

## 2021-11-23 DIAGNOSIS — D631 Anemia in chronic kidney disease: Secondary | ICD-10-CM | POA: Diagnosis not present

## 2021-11-24 ENCOUNTER — Encounter (HOSPITAL_COMMUNITY): Payer: Self-pay

## 2021-11-24 ENCOUNTER — Inpatient Hospital Stay (HOSPITAL_COMMUNITY)
Admission: EM | Admit: 2021-11-24 | Discharge: 2021-12-01 | DRG: 637 | Disposition: A | Discharge status: Home Health | Payer: Medicare Other | Attending: Family Medicine | Admitting: Family Medicine

## 2021-11-24 ENCOUNTER — Other Ambulatory Visit: Payer: Self-pay

## 2021-11-24 ENCOUNTER — Emergency Department (HOSPITAL_COMMUNITY): Payer: Medicare Other

## 2021-11-24 DIAGNOSIS — M159 Polyosteoarthritis, unspecified: Secondary | ICD-10-CM

## 2021-11-24 DIAGNOSIS — Z7982 Long term (current) use of aspirin: Secondary | ICD-10-CM

## 2021-11-24 DIAGNOSIS — Z79891 Long term (current) use of opiate analgesic: Secondary | ICD-10-CM

## 2021-11-24 DIAGNOSIS — W050XXA Fall from non-moving wheelchair, initial encounter: Secondary | ICD-10-CM | POA: Diagnosis present

## 2021-11-24 DIAGNOSIS — E039 Hypothyroidism, unspecified: Secondary | ICD-10-CM | POA: Diagnosis not present

## 2021-11-24 DIAGNOSIS — E1165 Type 2 diabetes mellitus with hyperglycemia: Secondary | ICD-10-CM | POA: Diagnosis not present

## 2021-11-24 DIAGNOSIS — E662 Morbid (severe) obesity with alveolar hypoventilation: Secondary | ICD-10-CM | POA: Diagnosis not present

## 2021-11-24 DIAGNOSIS — Z79899 Other long term (current) drug therapy: Secondary | ICD-10-CM

## 2021-11-24 DIAGNOSIS — R6889 Other general symptoms and signs: Secondary | ICD-10-CM | POA: Diagnosis not present

## 2021-11-24 DIAGNOSIS — E1151 Type 2 diabetes mellitus with diabetic peripheral angiopathy without gangrene: Secondary | ICD-10-CM | POA: Diagnosis present

## 2021-11-24 DIAGNOSIS — E11649 Type 2 diabetes mellitus with hypoglycemia without coma: Secondary | ICD-10-CM | POA: Diagnosis not present

## 2021-11-24 DIAGNOSIS — K5903 Drug induced constipation: Secondary | ICD-10-CM | POA: Diagnosis present

## 2021-11-24 DIAGNOSIS — J9601 Acute respiratory failure with hypoxia: Secondary | ICD-10-CM

## 2021-11-24 DIAGNOSIS — E875 Hyperkalemia: Secondary | ICD-10-CM | POA: Diagnosis not present

## 2021-11-24 DIAGNOSIS — Z7984 Long term (current) use of oral hypoglycemic drugs: Secondary | ICD-10-CM

## 2021-11-24 DIAGNOSIS — E1122 Type 2 diabetes mellitus with diabetic chronic kidney disease: Secondary | ICD-10-CM | POA: Diagnosis present

## 2021-11-24 DIAGNOSIS — Z91013 Allergy to seafood: Secondary | ICD-10-CM

## 2021-11-24 DIAGNOSIS — Z8249 Family history of ischemic heart disease and other diseases of the circulatory system: Secondary | ICD-10-CM

## 2021-11-24 DIAGNOSIS — R55 Syncope and collapse: Secondary | ICD-10-CM

## 2021-11-24 DIAGNOSIS — E1140 Type 2 diabetes mellitus with diabetic neuropathy, unspecified: Secondary | ICD-10-CM | POA: Diagnosis present

## 2021-11-24 DIAGNOSIS — Z743 Need for continuous supervision: Secondary | ICD-10-CM | POA: Diagnosis not present

## 2021-11-24 DIAGNOSIS — R404 Transient alteration of awareness: Secondary | ICD-10-CM | POA: Diagnosis not present

## 2021-11-24 DIAGNOSIS — G8929 Other chronic pain: Secondary | ICD-10-CM | POA: Diagnosis present

## 2021-11-24 DIAGNOSIS — Z6841 Body Mass Index (BMI) 40.0 and over, adult: Secondary | ICD-10-CM

## 2021-11-24 DIAGNOSIS — G9341 Metabolic encephalopathy: Secondary | ICD-10-CM | POA: Diagnosis not present

## 2021-11-24 DIAGNOSIS — Z20822 Contact with and (suspected) exposure to covid-19: Secondary | ICD-10-CM | POA: Diagnosis present

## 2021-11-24 DIAGNOSIS — J9611 Chronic respiratory failure with hypoxia: Secondary | ICD-10-CM | POA: Insufficient documentation

## 2021-11-24 DIAGNOSIS — I130 Hypertensive heart and chronic kidney disease with heart failure and stage 1 through stage 4 chronic kidney disease, or unspecified chronic kidney disease: Secondary | ICD-10-CM | POA: Diagnosis not present

## 2021-11-24 DIAGNOSIS — T402X5A Adverse effect of other opioids, initial encounter: Secondary | ICD-10-CM | POA: Diagnosis present

## 2021-11-24 DIAGNOSIS — Z66 Do not resuscitate: Secondary | ICD-10-CM | POA: Diagnosis present

## 2021-11-24 DIAGNOSIS — I517 Cardiomegaly: Secondary | ICD-10-CM | POA: Diagnosis not present

## 2021-11-24 DIAGNOSIS — N1832 Chronic kidney disease, stage 3b: Secondary | ICD-10-CM

## 2021-11-24 DIAGNOSIS — E118 Type 2 diabetes mellitus with unspecified complications: Secondary | ICD-10-CM

## 2021-11-24 DIAGNOSIS — D508 Other iron deficiency anemias: Secondary | ICD-10-CM | POA: Diagnosis not present

## 2021-11-24 DIAGNOSIS — I10 Essential (primary) hypertension: Secondary | ICD-10-CM | POA: Diagnosis present

## 2021-11-24 DIAGNOSIS — G4733 Obstructive sleep apnea (adult) (pediatric): Secondary | ICD-10-CM | POA: Diagnosis not present

## 2021-11-24 DIAGNOSIS — E519 Thiamine deficiency, unspecified: Secondary | ICD-10-CM | POA: Diagnosis present

## 2021-11-24 DIAGNOSIS — J9602 Acute respiratory failure with hypercapnia: Secondary | ICD-10-CM | POA: Diagnosis not present

## 2021-11-24 DIAGNOSIS — M5416 Radiculopathy, lumbar region: Secondary | ICD-10-CM

## 2021-11-24 DIAGNOSIS — W19XXXA Unspecified fall, initial encounter: Principal | ICD-10-CM

## 2021-11-24 DIAGNOSIS — E872 Acidosis, unspecified: Secondary | ICD-10-CM | POA: Diagnosis present

## 2021-11-24 DIAGNOSIS — N400 Enlarged prostate without lower urinary tract symptoms: Secondary | ICD-10-CM | POA: Diagnosis present

## 2021-11-24 DIAGNOSIS — Z9049 Acquired absence of other specified parts of digestive tract: Secondary | ICD-10-CM

## 2021-11-24 DIAGNOSIS — E162 Hypoglycemia, unspecified: Secondary | ICD-10-CM | POA: Diagnosis present

## 2021-11-24 DIAGNOSIS — E1142 Type 2 diabetes mellitus with diabetic polyneuropathy: Secondary | ICD-10-CM

## 2021-11-24 DIAGNOSIS — E119 Type 2 diabetes mellitus without complications: Secondary | ICD-10-CM | POA: Diagnosis not present

## 2021-11-24 DIAGNOSIS — Z888 Allergy status to other drugs, medicaments and biological substances status: Secondary | ICD-10-CM

## 2021-11-24 DIAGNOSIS — M109 Gout, unspecified: Secondary | ICD-10-CM

## 2021-11-24 DIAGNOSIS — E785 Hyperlipidemia, unspecified: Secondary | ICD-10-CM | POA: Diagnosis present

## 2021-11-24 DIAGNOSIS — M1611 Unilateral primary osteoarthritis, right hip: Secondary | ICD-10-CM | POA: Diagnosis not present

## 2021-11-24 DIAGNOSIS — Z85038 Personal history of other malignant neoplasm of large intestine: Secondary | ICD-10-CM

## 2021-11-24 DIAGNOSIS — D509 Iron deficiency anemia, unspecified: Secondary | ICD-10-CM | POA: Diagnosis present

## 2021-11-24 DIAGNOSIS — Z794 Long term (current) use of insulin: Secondary | ICD-10-CM

## 2021-11-24 DIAGNOSIS — W101XXA Fall (on)(from) sidewalk curb, initial encounter: Secondary | ICD-10-CM | POA: Diagnosis not present

## 2021-11-24 DIAGNOSIS — Z9884 Bariatric surgery status: Secondary | ICD-10-CM

## 2021-11-24 DIAGNOSIS — T1490XA Injury, unspecified, initial encounter: Secondary | ICD-10-CM

## 2021-11-24 DIAGNOSIS — I5032 Chronic diastolic (congestive) heart failure: Secondary | ICD-10-CM | POA: Diagnosis present

## 2021-11-24 DIAGNOSIS — E161 Other hypoglycemia: Secondary | ICD-10-CM | POA: Diagnosis not present

## 2021-11-24 DIAGNOSIS — Z833 Family history of diabetes mellitus: Secondary | ICD-10-CM

## 2021-11-24 DIAGNOSIS — T380X5A Adverse effect of glucocorticoids and synthetic analogues, initial encounter: Secondary | ICD-10-CM | POA: Diagnosis not present

## 2021-11-24 DIAGNOSIS — Z79890 Hormone replacement therapy (postmenopausal): Secondary | ICD-10-CM

## 2021-11-24 DIAGNOSIS — Z043 Encounter for examination and observation following other accident: Secondary | ICD-10-CM | POA: Diagnosis not present

## 2021-11-24 DIAGNOSIS — E66813 Obesity, class 3: Secondary | ICD-10-CM

## 2021-11-24 DIAGNOSIS — M10041 Idiopathic gout, right hand: Secondary | ICD-10-CM | POA: Diagnosis present

## 2021-11-24 LAB — CBC
HCT: 37.3 % — ABNORMAL LOW (ref 39.0–52.0)
Hemoglobin: 10.8 g/dL — ABNORMAL LOW (ref 13.0–17.0)
MCH: 28.7 pg (ref 26.0–34.0)
MCHC: 29 g/dL — ABNORMAL LOW (ref 30.0–36.0)
MCV: 99.2 fL (ref 80.0–100.0)
Platelets: 199 10*3/uL (ref 150–400)
RBC: 3.76 MIL/uL — ABNORMAL LOW (ref 4.22–5.81)
RDW: 18 % — ABNORMAL HIGH (ref 11.5–15.5)
WBC: 7.3 10*3/uL (ref 4.0–10.5)
nRBC: 0 % (ref 0.0–0.2)

## 2021-11-24 LAB — BLOOD GAS, VENOUS
Acid-base deficit: 0.6 mmol/L (ref 0.0–2.0)
Bicarbonate: 28.1 mmol/L — ABNORMAL HIGH (ref 20.0–28.0)
O2 Saturation: 86.4 %
Patient temperature: 37
pCO2, Ven: 64 mmHg — ABNORMAL HIGH (ref 44–60)
pH, Ven: 7.25 (ref 7.25–7.43)
pO2, Ven: 56 mmHg — ABNORMAL HIGH (ref 32–45)

## 2021-11-24 LAB — RESP PANEL BY RT-PCR (FLU A&B, COVID) ARPGX2
Influenza A by PCR: NEGATIVE
Influenza B by PCR: NEGATIVE
SARS Coronavirus 2 by RT PCR: NEGATIVE

## 2021-11-24 LAB — MAGNESIUM: Magnesium: 2.4 mg/dL (ref 1.7–2.4)

## 2021-11-24 LAB — COMPREHENSIVE METABOLIC PANEL
ALT: 24 U/L (ref 0–44)
AST: 44 U/L — ABNORMAL HIGH (ref 15–41)
Albumin: 3 g/dL — ABNORMAL LOW (ref 3.5–5.0)
Alkaline Phosphatase: 71 U/L (ref 38–126)
Anion gap: 7 (ref 5–15)
BUN: 42 mg/dL — ABNORMAL HIGH (ref 8–23)
CO2: 24 mmol/L (ref 22–32)
Calcium: 8.3 mg/dL — ABNORMAL LOW (ref 8.9–10.3)
Chloride: 105 mmol/L (ref 98–111)
Creatinine, Ser: 2.24 mg/dL — ABNORMAL HIGH (ref 0.61–1.24)
GFR, Estimated: 32 mL/min — ABNORMAL LOW (ref >60)
Glucose, Bld: 79 mg/dL (ref 70–99)
Potassium: 5.9 mmol/L — ABNORMAL HIGH (ref 3.5–5.1)
Sodium: 136 mmol/L (ref 135–145)
Total Bilirubin: 1.4 mg/dL — ABNORMAL HIGH (ref 0.3–1.2)
Total Protein: 7.9 g/dL (ref 6.5–8.1)

## 2021-11-24 LAB — CBG MONITORING, ED
Glucose-Capillary: 111 mg/dL — ABNORMAL HIGH (ref 70–99)
Glucose-Capillary: 101 mg/dL — ABNORMAL HIGH (ref 70–99)
Glucose-Capillary: 35 mg/dL — CL (ref 70–99)

## 2021-11-24 LAB — ETHANOL: Alcohol, Ethyl (B): <10 mg/dL (ref <10)

## 2021-11-24 MED ORDER — ACETAMINOPHEN 325 MG PO TABS
650.0000 mg | ORAL_TABLET | Freq: Four times a day (QID) | ORAL | Status: DC | PRN
  Administered 2021-11-25: 19:00:00 650 mg via ORAL
  Filled 2021-11-24: qty 2

## 2021-11-24 MED ORDER — SODIUM ZIRCONIUM CYCLOSILICATE 10 G PO PACK: 10.0000 g | PACK | Freq: Once | ORAL | Status: DC

## 2021-11-24 MED ORDER — CYCLOSPORINE 0.05 % OP EMUL
1.0000 [drp] | Freq: Two times a day (BID) | OPHTHALMIC | Status: DC
  Administered 2021-11-25 – 2021-12-01 (×12): 1 [drp] via OPHTHALMIC
  Filled 2021-11-24 (×13): qty 30

## 2021-11-24 MED ORDER — SODIUM CHLORIDE 0.9 % IV BOLUS
500.0000 mL | Freq: Once | INTRAVENOUS | Status: AC
  Administered 2021-11-25: 02:00:00 500 mL via INTRAVENOUS

## 2021-11-24 MED ORDER — NALOXONE HCL 0.4 MG/ML IJ SOLN
0.4000 mg | Freq: Once | INTRAMUSCULAR | Status: AC
  Administered 2021-11-24: 0.4 mg via INTRAVENOUS
  Filled 2021-11-24: qty 1

## 2021-11-24 MED ORDER — ENOXAPARIN SODIUM 100 MG/ML IJ SOSY
100.0000 mg | PREFILLED_SYRINGE | INTRAMUSCULAR | Status: DC
  Administered 2021-11-25 – 2021-12-01 (×7): 100 mg via SUBCUTANEOUS
  Filled 2021-11-24 (×7): qty 1

## 2021-11-24 MED ORDER — METHYLNALTREXONE BROMIDE 150 MG PO TABS: 450.0000 mg | ORAL_TABLET | Freq: Every day | ORAL | Status: DC

## 2021-11-24 MED ORDER — BISOPROLOL FUMARATE 5 MG PO TABS
10.0000 mg | ORAL_TABLET | Freq: Every evening | ORAL | Status: DC
  Administered 2021-11-25 – 2021-11-30 (×6): 10 mg via ORAL
  Filled 2021-11-24 (×6): qty 2

## 2021-11-24 MED ORDER — ACETAMINOPHEN 650 MG RE SUPP
650.0000 mg | Freq: Four times a day (QID) | RECTAL | Status: DC | PRN
Start: 2021-11-24 — End: 2021-12-01

## 2021-11-24 MED ORDER — FUROSEMIDE 40 MG PO TABS
100.0000 mg | ORAL_TABLET | Freq: Two times a day (BID) | ORAL | Status: DC
  Administered 2021-11-25 – 2021-11-28 (×7): 100 mg via ORAL
  Filled 2021-11-24 (×3): qty 3
  Filled 2021-11-24: qty 1
  Filled 2021-11-24 (×2): qty 3
  Filled 2021-11-24: qty 1

## 2021-11-24 MED ORDER — SODIUM CHLORIDE 0.9% FLUSH
3.0000 mL | Freq: Two times a day (BID) | INTRAVENOUS | Status: DC
  Administered 2021-11-25 – 2021-11-30 (×12): 3 mL via INTRAVENOUS

## 2021-11-24 MED ORDER — DEXTROSE 50 % IV SOLN
1.0000 | Freq: Once | INTRAVENOUS | Status: AC
  Administered 2021-11-24: 50 mL via INTRAVENOUS
  Filled 2021-11-24: qty 50

## 2021-11-24 MED ORDER — THIAMINE HCL 100 MG PO TABS
100.0000 mg | ORAL_TABLET | Freq: Every day | ORAL | Status: DC
  Administered 2021-11-25 – 2021-11-29 (×5): 100 mg via ORAL
  Filled 2021-11-24 (×5): qty 1

## 2021-11-24 MED ORDER — VITAMIN B-12 1000 MCG PO TABS
2000.0000 ug | ORAL_TABLET | Freq: Every day | ORAL | Status: DC
  Administered 2021-11-25 – 2021-11-29 (×5): 2000 ug via ORAL
  Filled 2021-11-24 (×5): qty 2

## 2021-11-24 MED ORDER — ADULT MULTIVITAMIN W/MINERALS CH
1.0000 | ORAL_TABLET | Freq: Every day | ORAL | Status: DC
  Administered 2021-11-25 – 2021-12-01 (×7): 1 via ORAL
  Filled 2021-11-24 (×7): qty 1

## 2021-11-24 MED ORDER — FERROUS SULFATE 325 (65 FE) MG PO TABS
325.0000 mg | ORAL_TABLET | Freq: Two times a day (BID) | ORAL | Status: DC
Start: 2021-11-25 — End: 2021-12-01
  Administered 2021-11-25 – 2021-12-01 (×13): 325 mg via ORAL
  Filled 2021-11-24 (×13): qty 1

## 2021-11-24 MED ORDER — LEVOTHYROXINE SODIUM 50 MCG PO TABS: 50.0000 ug | ORAL_TABLET | Freq: Every day | ORAL | Status: DC

## 2021-11-24 MED ORDER — INSULIN ASPART 100 UNIT/ML IV SOLN
5.0000 [IU] | Freq: Once | INTRAVENOUS | Status: DC
  Filled 2021-11-24: qty 0.05

## 2021-11-24 MED ORDER — ALBUTEROL SULFATE (2.5 MG/3ML) 0.083% IN NEBU: 2.5000 mg | INHALATION_SOLUTION | Freq: Four times a day (QID) | RESPIRATORY_TRACT | Status: DC | PRN

## 2021-11-24 MED ORDER — DEXTROSE-NACL 5-0.9 % IV SOLN: INTRAVENOUS | Status: DC

## 2021-11-24 MED ORDER — FUROSEMIDE 10 MG/ML IJ SOLN
40.0000 mg | Freq: Once | INTRAMUSCULAR | Status: DC
  Filled 2021-11-24: qty 4

## 2021-11-24 MED ORDER — ALBUTEROL SULFATE (2.5 MG/3ML) 0.083% IN NEBU
5.0000 mg | INHALATION_SOLUTION | Freq: Once | RESPIRATORY_TRACT | Status: AC
  Administered 2021-11-24: 5 mg via RESPIRATORY_TRACT
  Filled 2021-11-24: qty 6

## 2021-11-24 MED ORDER — ASPIRIN EC 81 MG PO TBEC
81.0000 mg | DELAYED_RELEASE_TABLET | Freq: Every day | ORAL | Status: DC
  Administered 2021-11-25 – 2021-12-01 (×7): 81 mg via ORAL
  Filled 2021-11-24 (×7): qty 1

## 2021-11-24 MED ORDER — ATORVASTATIN CALCIUM 40 MG PO TABS
40.0000 mg | ORAL_TABLET | Freq: Every day | ORAL | Status: DC
  Administered 2021-11-25 – 2021-12-01 (×7): 40 mg via ORAL
  Filled 2021-11-24 (×7): qty 1

## 2021-11-24 NOTE — ED Notes (Signed)
Call received from pt sister Damonte Frieson [phone] requesting rtn call for pt status/updates. ENMiles ?

## 2021-11-24 NOTE — ED Provider Notes (Signed)
[facility] DEPT Provider Note   CSN: [id number] Arrival date & time: 11/24/21  1804     History  No chief complaint on file.   [name] is a 66 y.o. male.  HPI Patient presents after a fall.  He was traveling on the street in a wheelchair when he lost consciousness and fell.  EMS reports a CBG on scene of 33.  He was given grape juice with resolution of his hypoglycemia.  He endorses pain to his right leg and hip.  Medical history includes DM, HLD, OSA, HTN, CHF, colon cancer, BPH, arthritis, obesity.  Patient states that he was in his normal state of health this morning.  He does take insulin and did take insulin this morning.  He has chronic pain and does take Norco.  Per chart review his normal dose is 110 mg tablet.  He states that he took 3 tablets this morning.    Home Medications Prior to Admission medications   Medication Sig Start Date End Date Taking? Authorizing Provider  Albuterol Sulfate (PROAIR RESPICLICK) 132 (90 Base) MCG/ACT AEPB Inhale 1 puff into the lungs 4 (four) times daily as needed. Patient taking differently: Inhale 1 puff into the lungs 4 (four) times daily as needed (shortness of breath/wheezing). 02/05/21  Yes [name], MD  aspirin EC 81 MG tablet Take 1 tablet (81 mg total) by mouth daily. 04/17/20  Yes [name], MD  bisoprolol (ZEBETA) 10 MG tablet Take 1 tablet (10 mg total) by mouth every evening. 04/19/21  Yes [name], MD  ferrous sulfate 325 (65 FE) MG tablet Take 1 tablet (325 mg total) by mouth 2 (two) times daily with a meal. 10/07/21  Yes [name], MD  furosemide (LASIX) 80 MG tablet Take 160 mg by mouth 2 (two) times daily. 11/23/21  Yes [provider]  HYDROcodone-acetaminophen (NORCO) 10-325 MG tablet Take 1 tablet by mouth every 6 (six) hours as needed for moderate pain. 10/07/21  Yes [name], MD  insulin detemir (LEVEMIR FLEXTOUCH) 100 UNIT/ML FlexPen Inject 200 Units into  the skin daily. 10/12/21  Yes [name], MD  levothyroxine (SYNTHROID) 75 MCG tablet Take 75 mcg by mouth daily. 11/19/21  Yes [provider]  losartan (COZAAR) 25 MG tablet Take 25 mg by mouth daily. 11/24/21  Yes [provider]  Multiple Vitamins-Minerals (MULTIVITAMIN ADULT PO) Take 1 tablet by mouth daily.   Yes [provider]  nystatin (MYCOSTATIN/NYSTOP) powder Apply 1 application topically 3 (three) times daily. 09/22/21  Yes Gatha Mayer, MD  RESTASIS 0.05 % ophthalmic emulsion PLACE 1 DROP INTO BOTH EYES TWICE A DAY Patient taking differently: Place 1 drop into both eyes every other day. 05/07/21  Yes [name], MD  Accu-Chek Softclix Lancets lancets CHECK BLOOD SUGAR 2 TIMES  DAILY 10/26/21   [name], MD  atorvastatin (LIPITOR) 40 MG tablet TAKE 1 TABLET BY MOUTH DAILY. Patient not taking: Reported on 11/25/2021 10/18/21   [name], MD  blood glucose meter kit and supplies KIT Inject 1 each into the skin 4 (four) times daily. Use to check blood sugar three times daily. Dispense Freestyle Libre according to insurance preference. DX: E11.8 10/26/21   [name], MD  blood glucose meter kit and supplies Dispense based on patient and insurance preference. Use up to four times daily as directed. (FOR ICD-10 E10.9, E11.9). 10/26/21   [name], MD  Blood Glucose Monitoring Suppl (ACCU-CHEK AVIVA CONNECT) w/Device KIT 1 Act by Does not apply route 3 (three) times daily as needed. 10/26/21   [name], MD  Blood Glucose Monitoring Suppl (ACCU-CHEK GUIDE) w/Device KIT Inject 1 Act into the skin 4 (four) times daily. 10/26/21   [name], MD  Cholecalciferol 50 MCG (2000 UT) TABS Take 1 tablet (2,000 Units total) by mouth daily. Patient not taking: Reported on 11/25/2021 04/17/20   [name], MD  Continuous Blood Gluc Receiver (FREESTYLE LIBRE 2 READER) DEVI 1 Act by Does not apply route daily. 07/27/21   [name], MD  Continuous  Blood Gluc Sensor (FREESTYLE LIBRE 2 SENSOR) MISC 1 Act by Does not apply route daily. 07/27/21   [name], MD  cyanocobalamin 2000 MCG tablet Take 1 tablet (2,000 mcg total) by mouth daily. Patient not taking: Reported on 11/25/2021 04/17/20   [name], MD  furosemide (LASIX) 20 MG tablet Take 5 tablets (100 mg total) by mouth 2 (two) times daily. Patient not taking: Reported on 11/25/2021 08/10/21   Rafael Bihari, FNP  Glucagon (GVOKE HYPOPEN 2-PACK) 1 MG/0.2ML SOAJ Inject 1 Act into the skin daily as needed. Patient taking differently: Inject 1 mg into the skin daily as needed (low blood sugar). 07/23/20   [name], MD  glucose blood (ACCU-CHEK AVIVA PLUS) test strip Use to test blood sugar twice daily. DX E11.8 10/21/20   [name], MD  Insulin Pen Needle (ULTICARE SHORT PEN NEEDLES) 31G X 8 MM MISC USE TWO PEN NEEDLES DAILY WITH INSULIN PENS 07/19/21   [name], MD  levothyroxine (SYNTHROID) 50 MCG tablet TAKE 1 TABLET BY MOUTH DAILY BEFORE BREAKFAST. Patient not taking: Reported on 11/25/2021 10/18/21   [name], MD  Methylnaltrexone Bromide (RELISTOR) 150 MG TABS Take 3 tablets by mouth daily. Patient not taking: Reported on 11/25/2021 10/07/21   [name], MD  potassium chloride SA (KLOR-CON M) 20 MEQ tablet TAKE 1 TABLET BY MOUTH 2 TIMES DAILY. Patient not taking: Reported on 11/25/2021 09/01/21   Bensimhon, Shaune Pascal, MD  SYNJARDY 12.01-999 MG TABS TAKE 1 TABLET BY MOUTH  TWICE DAILY Patient not taking: Reported on 11/25/2021 01/10/21   [name], MD  thiamine (VITAMIN B-1) 100 MG tablet Take 1 tablet (100 mg total) by mouth daily. Patient not taking: Reported on 11/25/2021 10/07/21   [name], MD  tirzepatide [facility]) 2.5 MG/0.5ML Pen Inject 2.5 mg into the skin once a week. Patient not taking: Reported on 11/25/2021 10/07/21   [name], MD      Allergies    Shellfish allergy, Lisinopril, Relistor [methylnaltrexone bromide], and  Lovastatin    Review of Systems   Review of Systems  Constitutional:  Positive for fatigue.  Musculoskeletal:  Positive for arthralgias.  Neurological:  Positive for syncope.  All other systems reviewed and are negative.  Physical Exam Updated Vital Signs BP (!) 158/63    Pulse 71    Temp 98.5 F (36.9 C) (Oral)    Resp 15    Ht 6' 1" (1.854 m)    Wt (!) 252.2 kg    SpO2 91%    BMI 73.36 kg/m  Physical Exam Vitals and nursing note reviewed.  Constitutional:      General: He is sleeping. He is not in acute distress.    Appearance: He is well-developed. He is obese. He is ill-appearing. He is not  toxic-appearing or diaphoretic.  HENT:     Head: Normocephalic and atraumatic.     Right Ear: External ear normal.     Left Ear: External ear normal.     Nose: Nose normal.     Mouth/Throat:     Mouth: Mucous membranes are moist.  Eyes:     Extraocular Movements: Extraocular movements intact.     Conjunctiva/sclera: Conjunctivae normal.  Cardiovascular:     Rate and Rhythm: Normal rate and regular rhythm.     Heart sounds: No murmur heard. Pulmonary:     Effort: Pulmonary effort is normal. No respiratory distress.     Breath sounds: Normal breath sounds. No wheezing or rales.  Chest:     Chest wall: No tenderness.  Abdominal:     Palpations: Abdomen is soft.     Tenderness: There is no abdominal tenderness.  Musculoskeletal:        General: No swelling, tenderness or deformity.     Cervical back: Normal range of motion and neck supple.     Right lower leg: Edema present.     Left lower leg: Edema present.  Skin:    General: Skin is warm and dry.     Coloration: Skin is not jaundiced or pale.  Neurological:     General: No focal deficit present.     Mental Status: He is lethargic and disoriented.  Psychiatric:        Mood and Affect: Mood normal.    ED Results / Procedures / Treatments   Labs (all labs ordered are listed, but only abnormal results are displayed) Labs  Reviewed  BLOOD GAS, VENOUS - Abnormal; Notable for the following components:      Result Value   pH, Ven 7.18 (*)    pCO2, Ven 75 (*)    Acid-base deficit 2.2 (*)    All other components within normal limits  COMPREHENSIVE METABOLIC PANEL - Abnormal; Notable for the following components:   Potassium 5.9 (*)    BUN 42 (*)    Creatinine, Ser 2.24 (*)    Calcium 8.3 (*)    Albumin 3.0 (*)    AST 44 (*)    Total Bilirubin 1.4 (*)    GFR, Estimated 32 (*)    All other components within normal limits  CBC - Abnormal; Notable for the following components:   RBC 3.76 (*)    Hemoglobin 10.8 (*)    HCT 37.3 (*)    MCHC 29.0 (*)    RDW 18.0 (*)    All other components within normal limits  BLOOD GAS, VENOUS - Abnormal; Notable for the following components:   pCO2, Ven 64 (*)    pO2, Ven 56 (*)    Bicarbonate 28.1 (*)    All other components within normal limits  CBC - Abnormal; Notable for the following components:   RBC 3.23 (*)    Hemoglobin 9.2 (*)    HCT 32.6 (*)    MCV 100.9 (*)    MCHC 28.2 (*)    RDW 18.0 (*)    All other components within normal limits  GLUCOSE, CAPILLARY - Abnormal; Notable for the following components:   Glucose-Capillary 23 (*)    All other components within normal limits  GLUCOSE, CAPILLARY - Abnormal; Notable for the following components:   Glucose-Capillary 53 (*)    All other components within normal limits  GLUCOSE, CAPILLARY - Abnormal; Notable for the following components:   Glucose-Capillary 66 (*)  All other components within normal limits  COMPREHENSIVE METABOLIC PANEL - Abnormal; Notable for the following components:   BUN 44 (*)    Creatinine, Ser 2.13 (*)    Calcium 8.1 (*)    Albumin 2.6 (*)    GFR, Estimated 34 (*)    All other components within normal limits  GLUCOSE, CAPILLARY - Abnormal; Notable for the following components:   Glucose-Capillary 46 (*)    All other components within normal limits  GLUCOSE, CAPILLARY -  Abnormal; Notable for the following components:   Glucose-Capillary 68 (*)    All other components within normal limits  GLUCOSE, CAPILLARY - Abnormal; Notable for the following components:   Glucose-Capillary 68 (*)    All other components within normal limits  GLUCOSE, CAPILLARY - Abnormal; Notable for the following components:   Glucose-Capillary 112 (*)    All other components within normal limits  CBC - Abnormal; Notable for the following components:   RBC 3.88 (*)    Hemoglobin 10.9 (*)    HCT 37.0 (*)    MCHC 29.5 (*)    RDW 17.6 (*)    Platelets 146 (*)    All other components within normal limits  BASIC METABOLIC PANEL - Abnormal; Notable for the following components:   Glucose, Bld 133 (*)    BUN 43 (*)    Creatinine, Ser 2.18 (*)    Calcium 8.2 (*)    GFR, Estimated 33 (*)    All other components within normal limits  GLUCOSE, CAPILLARY - Abnormal; Notable for the following components:   Glucose-Capillary 131 (*)    All other components within normal limits  GLUCOSE, CAPILLARY - Abnormal; Notable for the following components:   Glucose-Capillary 155 (*)    All other components within normal limits  GLUCOSE, CAPILLARY - Abnormal; Notable for the following components:   Glucose-Capillary 129 (*)    All other components within normal limits  GLUCOSE, CAPILLARY - Abnormal; Notable for the following components:   Glucose-Capillary 135 (*)    All other components within normal limits  GLUCOSE, CAPILLARY - Abnormal; Notable for the following components:   Glucose-Capillary 175 (*)    All other components within normal limits  BLOOD GAS, ARTERIAL - Abnormal; Notable for the following components:   pO2, Arterial 54 (*)    Bicarbonate 31.9 (*)    Acid-Base Excess 6.6 (*)    All other components within normal limits  GLUCOSE, CAPILLARY - Abnormal; Notable for the following components:   Glucose-Capillary 172 (*)    All other components within normal limits  CBG  MONITORING, ED - Abnormal; Notable for the following components:   Glucose-Capillary 111 (*)    All other components within normal limits  CBG MONITORING, ED - Abnormal; Notable for the following components:   Glucose-Capillary 101 (*)    All other components within normal limits  CBG MONITORING, ED - Abnormal; Notable for the following components:   Glucose-Capillary 35 (*)    All other components within normal limits  RESP PANEL BY RT-PCR (FLU A&B, COVID) ARPGX2  MRSA NEXT GEN BY PCR, NASAL  ETHANOL  MAGNESIUM  GLUCOSE, CAPILLARY  GLUCOSE, CAPILLARY  GLUCOSE, CAPILLARY  GLUCOSE, CAPILLARY  GLUCOSE, CAPILLARY  GLUCOSE, CAPILLARY    EKG EKG Interpretation  Date/Time:  Wednesday November 24 2021 21:26:23 EST Ventricular Rate:  61 PR Interval:  183 QRS Duration: 118 QT Interval:  477 QTC Calculation: 481 R Axis:   -35 Text Interpretation: Sinus rhythm Nonspecific intraventricular  conduction delay Low voltage, precordial leads Consider anterolateral infarct Confirmed by Godfrey Pick [phone]) on 11/24/2021 10:08:33 PM  Radiology DG Chest 1 View  Result Date: 11/24/2021 CLINICAL DATA:  [place] out of chair EXAM: CHEST  1 VIEW COMPARISON:  03/29/2021 FINDINGS: Cardiomegaly with vascular congestion. No focal opacity, pleural effusion or pneumothorax. IMPRESSION: Cardiomegaly with vascular congestion. Electronically Signed   By: Donavan Foil M.D.   On: 11/24/2021 19:58   DG Hip Unilat W or Wo Pelvis 2-3 Views Right  Result Date: 11/24/2021 CLINICAL DATA:  Fall. EXAM: DG HIP (WITH OR WITHOUT PELVIS) 2-3V RIGHT COMPARISON:  CT abdomen and pelvis 09/28/2021 FINDINGS: Examination is technically limited secondary to body habitus. Right-sided hip screws are again seen. No definite displaced acute fracture. There are severe degenerative changes of the right hip, similar to the prior study. Left hip screws and left hip are grossly within normal limits. IMPRESSION: 1. Technically limited study. No definite  acute fracture or dislocation of the right hip. If there is high clinical concern for nondisplaced fracture, consider further evaluation with CT. 2. Bilateral hip screws are unchanged in position. 3. Stable severe degenerative changes of the right hip. Electronically Signed   By: Ronney Asters M.D.   On: 11/24/2021 19:59    Procedures Procedures    Medications Ordered in ED Medications  sodium zirconium cyclosilicate (LOKELMA) packet 10 g (0 g Oral Hold 11/25/21 0035)  aspirin EC tablet 81 mg (81 mg Oral Given 11/26/21 0959)  atorvastatin (LIPITOR) tablet 40 mg (40 mg Oral Given 11/26/21 0959)  bisoprolol (ZEBETA) tablet 10 mg (10 mg Oral Given 11/25/21 1707)  furosemide (LASIX) tablet 100 mg (100 mg Oral Given 11/26/21 0902)  Methylnaltrexone Bromide TABS 450 mg (450 mg Oral Not Given 11/26/21 1225)  vitamin B-12 (CYANOCOBALAMIN) tablet 2,000 mcg (2,000 mcg Oral Given 11/26/21 1000)  ferrous sulfate tablet 325 mg (325 mg Oral Given 11/26/21 0902)  thiamine tablet 100 mg (100 mg Oral Given 11/26/21 0959)  multivitamin with minerals tablet 1 tablet (1 tablet Oral Given 11/26/21 1000)  albuterol (PROVENTIL) (2.5 MG/3ML) 0.083% nebulizer solution 2.5 mg (has no administration in time range)  cycloSPORINE (RESTASIS) 0.05 % ophthalmic emulsion 1 drop (1 drop Both Eyes Given 11/26/21 1014)  enoxaparin (LOVENOX) injection 100 mg (100 mg Subcutaneous Given 11/26/21 1001)  sodium chloride flush (NS) 0.9 % injection 3 mL (3 mLs Intravenous Given 11/26/21 1013)  acetaminophen (TYLENOL) tablet 650 mg (650 mg Oral Given 11/25/21 1855)    Or  acetaminophen (TYLENOL) suppository 650 mg ( Rectal See Alternative 11/25/21 1855)  Chlorhexidine Gluconate Cloth 2 % PADS 6 each (6 each Topical Given 11/26/21 1333)  chlorhexidine (PERIDEX) 0.12 % solution 15 mL (15 mLs Mouth Rinse Given 11/26/21 1000)  MEDLINE mouth rinse (15 mLs Mouth Rinse Not Given 11/26/21 1226)  dextrose 50 % solution 25-50 mL (50 mLs Intravenous Given 11/25/21  0349)  dextrose 10 % infusion (0 mLs Intravenous Stopped 11/25/21 1842)  levothyroxine (SYNTHROID) tablet 75 mcg (75 mcg Oral Given 11/26/21 0556)  HYDROcodone-acetaminophen (NORCO/VICODIN) 5-325 MG per tablet 1 tablet (1 tablet Oral Given 11/26/21 1223)  naloxone [facility] injection 0.4 mg (0.4 mg Intravenous Given 11/24/21 1930)  sodium chloride 0.9 % bolus 500 mL ( Intravenous Stopped 11/25/21 0247)  albuterol (PROVENTIL) (2.5 MG/3ML) 0.083% nebulizer solution 5 mg (5 mg Nebulization Given 11/24/21 2215)  dextrose 50 % solution 50 mL (50 mLs Intravenous Given 11/24/21 2300)  dextrose 50 % solution 50 mL (50 mLs Intravenous Given  11/24/21 2337)  dextrose 50 % solution 25 g (0 g Intravenous Duplicate 03/23/07 1448)  dextrose 50 % solution 25 g (25 g Intravenous Given 11/25/21 0215)  dextrose 50 % solution 12.5 g (12.5 g Intravenous Given 11/25/21 0245)  dextrose 50 % solution 12.5 g (12.5 g Intravenous Given 11/25/21 0508)  dextrose 50 % solution 12.5 g (12.5 g Intravenous Given 11/25/21 0635)  traMADol (ULTRAM) tablet 50 mg (50 mg Oral Given 11/25/21 2222)    ED Course/ Medical Decision Making/ A&P                           Medical Decision Making Amount and/or Complexity of Data Reviewed Labs: ordered. Radiology: ordered.  Risk Prescription drug management. Decision regarding hospitalization.   This patient presents to the ED for concern of fall, this involves an extensive number of treatment options, and is a complaint that carries with it a high risk of complications and morbidity.  The differential diagnosis includes poor p.o. intake following hypoglycemia, vasovagal syncope, cardiac arrhythmia, dehydration, infection, metabolic derangement   Co morbidities that complicate the patient evaluation  HLD, obesity, OSA, HTN, CHF, hypothyroidism, DM, PAD   Additional history obtained:  Additional history obtained from N/A External records from outside source obtained and reviewed including EMR     Lab Tests:  I Ordered, and personally interpreted labs.  The pertinent results include: Respiratory acidosis on blood gas, baseline anemia, no leukocytosis, hyperkalemia, baseline CKD, recurrence of hypoglycemia   Imaging Studies ordered:  I ordered imaging studies including chest x-ray, right hip x-ray I independently visualized and interpreted imaging which showed no acute findings I agree with the radiologist interpretation   Cardiac Monitoring:  The patient was maintained on a cardiac monitor.  I personally viewed and interpreted the cardiac monitored which showed an underlying rhythm of: Sinus rhythm   Medicines ordered and prescription drug management:  I ordered medication including Narcan for opiate reversal, dextrose/insulin, IV fluids, and albuterol for hyperkalemia Reevaluation of the patient after these medicines showed that the patient stayed the same I have reviewed the patients home medicines and have made adjustments as needed    Critical Interventions:  BiPAP for hypercarbic respiratory failure   Problem List / ED Course:  Patient is a 66 year old male with severe obesity, presenting after a fall.  At time of fall, he was found to have a blood glucose of 33.  He was provided with dextrose prior to arrival.  On initial assessment, patient is somnolent but arousable.  He endorses pain in the area of his right hip and proximal right leg.  X-ray imaging was ordered.  Due to the patient's somnolence, blood gas was ordered and patient was found to have acute respiratory failure with hypercarbia.  I suspect that this is primarily due to pickwickian syndrome.  However, patient also endorses taking 3 times his normal dose of opiate pain medication this morning.  Dose of Narcan was given without any change.  Patient was started on BiPAP for respiratory support.  His remaining work-up was notable for hyperkalemia and temporizing medications were given.  On repeat blood  gas, there is improvement, however, patient continues to be hypercarbic.  He was admitted to hospitalist for further management.    Social Determinants of Health:  Poor mobility  CRITICAL CARE Performed by: Godfrey Pick   Total critical care time: 35 minutes  Critical care time was exclusive of separately billable procedures and treating other  patients.  Critical care was necessary to treat or prevent imminent or life-threatening deterioration.  Critical care was time spent personally by me on the following activities: development of treatment plan with patient and/or surrogate as well as nursing, discussions with consultants, evaluation of patient's response to treatment, examination of patient, obtaining history from patient or surrogate, ordering and performing treatments and interventions, ordering and review of laboratory studies, ordering and review of radiographic studies, pulse oximetry and re-evaluation of patient's condition.          Final Clinical Impression(s) / ED Diagnoses Final diagnoses:  Trauma  Fall, initial encounter  Hypoglycemia  Acute respiratory failure with hypercapnia [facility])    Rx / DC Orders ED Discharge Orders     None         Godfrey Pick, MD 11/26/21 1557

## 2021-11-24 NOTE — ED Triage Notes (Signed)
Patient BIB [facility] from store. Patient was found going down in the street in a wheelchair when he loss conscious and fell out of his chair hurting his right leg and hip. Patients CBG was 33. No N/V/D, chest pain. Patient was given grape juice raising his CBG to 286.  ? ?Vital signs were:  ? ?150/90 ?16-RR ?88-HR ?96% on 4L ?

## 2021-11-24 NOTE — H&P (Signed)
History and Physical   [name] JEH:[id number] DOB: [birth year] DOA: 11/24/2021  PCP: [name], MD   Patient coming from: Street   Chief Complaint: fall  HPI: [name] is a 66 y.o. male with medical history significant of ventral hernia, left foot ulcer, opioid-induced constipation, chronic opioid medication use, vitamin deficiency, diabetes, peripheral arterial disease, OSA, OHS, anemia, hypothyroidism, hyperlipidemia, hypertension, neuropathy, colon cancer in 2014, CHF presenting after fall.  Patient presented after he had a fall out of his wheelchair traveling down the sidewalk.  States he lost consciousness and fell.  Was evaluated by EMS and found to have a glucose of 33 this improved with juice.  Also reporting pain in the right leg and hip due to the fall.  Denies fevers, chills, chest pain, shortness of breath, abdominal pain, constipation, diarrhea, nausea, vomiting.  ED Course: Vital signs in the ED significant for pulse in the 50s to 60s, blood pressure in the 37C to 588F systolic, requiring oxygen initially and then BiPAP.  Lab work-up included CMP with potassium 5.9, BUN 42, creatinine stable at 2.24, calcium 8.8, albumin 3.3, AST 44, T. bili 1.4.  CBC with hemoglobin stable at 10.8.  Respiratory panel flu and COVID-negative.  Ethanol level negative.  VBG initially with pH of 7.18 which improved to normal with BiPAP and initial PE CO2 of 75 which improved to 64 with BiPAP.  Magnesium level normal.  Patient received Lokelma, insulin and dextrose followed by dextrose drip, 500 cc bolus, albuterol, Narcan in the ED.  Was placed on BiPAP as above.  Review of Systems: As per HPI otherwise all other systems reviewed and are negative.  Past Medical History:  Diagnosis Date   Anemia    iron   CHF (congestive heart failure) ([place])    Presumed diastolic. Echo (06/07) w.EF 45%, severe posterior HK, mild LV hypertrophy, No further work-up of abnormal echo was done. pt denies  CHF   Colon cancer ([place]) 2007   s/p sigmoid colectomy   Diabetes mellitus    Has Hx of diabetic foot ulcer & peripheral neuropathy  type2   Dyspnea    w/ activity   History of PFTs 05/2009   Mild Obstructive defect   HTN (hypertension)    Hyperlipidemia    Morbid obesity (HCC)    OSA on CPAP    Pneumonia    week ago     Past Surgical History:  Procedure Laterality Date   BARIATRIC SURGERY  12/2009   Lap Band/At [facility]   COLONOSCOPY  multiple   COLONOSCOPY WITH PROPOFOL N/A 11/26/2012   Procedure: COLONOSCOPY WITH PROPOFOL;  Surgeon: Gatha Mayer, MD;  Location: [facility];  Service: Endoscopy;  Laterality: N/A;  may need pre appt. with anesthesia due to morbid obesity   COLONOSCOPY WITH PROPOFOL N/A 07/11/2017   Procedure: COLONOSCOPY WITH PROPOFOL;  Surgeon: Gatha Mayer, MD;  Location: [facility];  Service: Endoscopy;  Laterality: N/A;   [place]   '80/Right  '84/Left   EYE SURGERY Right 05/2017   HERNIA REPAIR     HIP SURGERY     bilateral   INCISIONAL HERNIA REPAIR N/A 12/18/2013   Procedure:  REPAIR OF INCARCERATED INCISIONAL HERNIA;  Surgeon: Rolm Bookbinder, MD;  Location: [place];  Service: General;  Laterality: N/A;   INSERTION OF MESH N/A 12/18/2013   Procedure: INSERTION OF MESH;  Surgeon: Rolm Bookbinder, MD;  Location: [place];  Service: General;  Laterality: N/A;  Sigmoid Colectomy  10/2005   [place]   TONSILLECTOMY      Social History  reports that he has never smoked. He has never used smokeless tobacco. He reports that he does not currently use alcohol. He reports that he does not use drugs.  Allergies  Allergen Reactions   Shellfish Allergy Anaphylaxis   Lisinopril Cough   Lovastatin Itching    Family History  Problem Relation Age of Onset   Hepatitis Mother        hepatitis C   Diabetes Mother    Congestive Heart Failure Mother    CAD Mother    Heart attack Brother 51   Diabetes Father    Heart failure Brother     Stomach cancer Neg Hx    Colon cancer Neg Hx   Reviewed on admission  Prior to Admission medications   Medication Sig Start Date End Date Taking? Authorizing Provider  Accu-Chek Softclix Lancets lancets CHECK BLOOD SUGAR 2 TIMES  DAILY 10/26/21   [name], MD  Albuterol Sulfate (PROAIR RESPICLICK) 308 (90 Base) MCG/ACT AEPB Inhale 1 puff into the lungs 4 (four) times daily as needed. Patient taking differently: Inhale 1 puff into the lungs 4 (four) times daily as needed (shortness of breath/wheezing). 02/05/21   [name], MD  aspirin EC 81 MG tablet Take 1 tablet (81 mg total) by mouth daily. 04/17/20   [name], MD  atorvastatin (LIPITOR) 40 MG tablet TAKE 1 TABLET BY MOUTH DAILY. 10/18/21   [name], MD  bisoprolol (ZEBETA) 10 MG tablet Take 1 tablet (10 mg total) by mouth every evening. 04/19/21   [name], MD  blood glucose meter kit and supplies KIT Inject 1 each into the skin 4 (four) times daily. Use to check blood sugar three times daily. Dispense Freestyle Libre according to insurance preference. DX: E11.8 10/26/21   [name], MD  blood glucose meter kit and supplies Dispense based on patient and insurance preference. Use up to four times daily as directed. (FOR ICD-10 E10.9, E11.9). 10/26/21   [name], MD  Blood Glucose Monitoring Suppl (ACCU-CHEK AVIVA CONNECT) w/Device KIT 1 Act by Does not apply route 3 (three) times daily as needed. 10/26/21   [name], MD  Blood Glucose Monitoring Suppl (ACCU-CHEK GUIDE) w/Device KIT Inject 1 Act into the skin 4 (four) times daily. 10/26/21   [name], MD  Cholecalciferol 50 MCG (2000 UT) TABS Take 1 tablet (2,000 Units total) by mouth daily. 04/17/20   [name], MD  Continuous Blood Gluc Receiver (FREESTYLE LIBRE 2 READER) DEVI 1 Act by Does not apply route daily. 07/27/21   [name], MD  Continuous Blood Gluc Sensor (FREESTYLE LIBRE 2 SENSOR) MISC 1 Act by Does not apply route daily.  07/27/21   [name], MD  cyanocobalamin 2000 MCG tablet Take 1 tablet (2,000 mcg total) by mouth daily. 04/17/20   [name], MD  ferrous sulfate 325 (65 FE) MG tablet Take 1 tablet (325 mg total) by mouth 2 (two) times daily with a meal. 10/07/21   [name], MD  furosemide (LASIX) 20 MG tablet Take 5 tablets (100 mg total) by mouth 2 (two) times daily. 08/10/21   [place], Maricela Bo, FNP  Glucagon (GVOKE HYPOPEN 2-PACK) 1 MG/0.2ML SOAJ Inject 1 Act into the skin daily as needed. 07/23/20   [name], MD  glucose blood (ACCU-CHEK AVIVA PLUS) test  strip Use to test blood sugar twice daily. DX E11.8 10/21/20   Etta Grandchild, MD  HYDROcodone-acetaminophen (NORCO) 10-325 MG tablet Take 1 tablet by mouth every 6 (six) hours as needed for moderate pain. 10/07/21   Etta Grandchild, MD  insulin detemir (LEVEMIR FLEXTOUCH) 100 UNIT/ML FlexPen Inject 200 Units into the skin daily. 10/12/21   Etta Grandchild, MD  Insulin Pen Needle (ULTICARE SHORT PEN NEEDLES) 31G X 8 MM MISC USE TWO PEN NEEDLES DAILY WITH INSULIN PENS 07/19/21   Etta Grandchild, MD  levothyroxine (SYNTHROID) 50 MCG tablet TAKE 1 TABLET BY MOUTH DAILY BEFORE BREAKFAST. 10/18/21   Etta Grandchild, MD  Methylnaltrexone Bromide (RELISTOR) 150 MG TABS Take 3 tablets by mouth daily. 10/07/21   Etta Grandchild, MD  Multiple Vitamins-Minerals (MULTIVITAMIN ADULT PO) Take 1 tablet by mouth daily.    [provider]  nystatin (MYCOSTATIN/NYSTOP) powder Apply 1 application topically 3 (three) times daily. 09/22/21   Iva Boop, MD  potassium chloride SA (KLOR-CON M) 20 MEQ tablet TAKE 1 TABLET BY MOUTH 2 TIMES DAILY. 09/01/21   Bensimhon, Bevelyn Buckles, MD  RESTASIS 0.05 % ophthalmic emulsion PLACE 1 DROP INTO BOTH EYES TWICE A DAY Patient taking differently: Place 1 drop into both eyes 2 (two) times daily. 05/07/21   Etta Grandchild, MD  SYNJARDY 12.01-999 MG TABS TAKE 1 TABLET BY MOUTH  TWICE DAILY 01/10/21   Etta Grandchild, MD   thiamine (VITAMIN B-1) 100 MG tablet Take 1 tablet (100 mg total) by mouth daily. 10/07/21   Etta Grandchild, MD  tirzepatide [facility]) 2.5 MG/0.5ML Pen Inject 2.5 mg into the skin once a week. 10/07/21   Etta Grandchild, MD    Physical Exam: Vitals:   11/24/21 2153 11/24/21 2216 11/24/21 2230 11/24/21 2330  BP:   (!) 96/58 (!) 94/58  Pulse: 65  (!) 56 (!) 56  Resp:   13 (!) 9  SpO2: 100% 100% 100% 94%  Weight:      Height:       Physical Exam Constitutional:      General: He is not in acute distress.    Appearance: Normal appearance. He is obese.  HENT:     Head: Normocephalic and atraumatic.     Mouth/Throat:     Mouth: Mucous membranes are moist.     Pharynx: Oropharynx is clear.  Eyes:     Extraocular Movements: Extraocular movements intact.     Pupils: Pupils are equal, round, and reactive to light.  Cardiovascular:     Rate and Rhythm: Normal rate and regular rhythm.     Pulses: Normal pulses.     Heart sounds: Normal heart sounds.     Comments: Distant heart sounds Pulmonary:     Effort: Pulmonary effort is normal. No respiratory distress.     Breath sounds: Normal breath sounds.     Comments: Distant breath sounds Abdominal:     General: Bowel sounds are normal. There is no distension.     Palpations: Abdomen is soft.     Tenderness: There is no abdominal tenderness.  Musculoskeletal:        General: No swelling.     Right lower leg: Edema present.     Left lower leg: Edema present.  Skin:    General: Skin is warm and dry.  Neurological:     General: No focal deficit present.     Mental Status: Mental status is at baseline.  Labs on Admission: I have personally reviewed following labs and imaging studies  CBC: Recent Labs  Lab 11/24/21 1932  WBC 7.3  HGB 10.8*  HCT 37.3*  MCV 99.2  PLT 440    Basic Metabolic Panel: Recent Labs  Lab 11/24/21 1932  NA 136  K 5.9*  CL 105  CO2 24  GLUCOSE 79  BUN 42*  CREATININE 2.24*  CALCIUM 8.3*  MG  2.4    GFR: Estimated Creatinine Clearance: 63.1 mL/min (A) (by C-G formula based on SCr of 2.24 mg/dL (H)).  Liver Function Tests: Recent Labs  Lab 11/24/21 1932  AST 44*  ALT 24  ALKPHOS 71  BILITOT 1.4*  PROT 7.9  ALBUMIN 3.0*    Urine analysis:    Component Value Date/Time   COLORURINE YELLOW 09/28/2021 1934   APPEARANCEUR CLEAR 09/28/2021 1934   LABSPEC 1.011 09/28/2021 1934   PHURINE 5.0 09/28/2021 1934   GLUCOSEU >=500 (A) 09/28/2021 1934   GLUCOSEU >=1000 (A) 12/08/2020 0918   HGBUR NEGATIVE 09/28/2021 1934   BILIRUBINUR NEGATIVE 09/28/2021 1934   KETONESUR NEGATIVE 09/28/2021 1934   PROTEINUR NEGATIVE 09/28/2021 1934   UROBILINOGEN 0.2 12/08/2020 0918   NITRITE NEGATIVE 09/28/2021 1934   LEUKOCYTESUR NEGATIVE 09/28/2021 1934    Radiological Exams on Admission: DG Chest 1 View  Result Date: 11/24/2021 CLINICAL DATA:  [place] out of chair EXAM: CHEST  1 VIEW COMPARISON:  03/29/2021 FINDINGS: Cardiomegaly with vascular congestion. No focal opacity, pleural effusion or pneumothorax. IMPRESSION: Cardiomegaly with vascular congestion. Electronically Signed   By: Donavan Foil M.D.   On: 11/24/2021 19:58   DG Hip Unilat W or Wo Pelvis 2-3 Views Right  Result Date: 11/24/2021 CLINICAL DATA:  Fall. EXAM: DG HIP (WITH OR WITHOUT PELVIS) 2-3V RIGHT COMPARISON:  CT abdomen and pelvis 09/28/2021 FINDINGS: Examination is technically limited secondary to body habitus. Right-sided hip screws are again seen. No definite displaced acute fracture. There are severe degenerative changes of the right hip, similar to the prior study. Left hip screws and left hip are grossly within normal limits. IMPRESSION: 1. Technically limited study. No definite acute fracture or dislocation of the right hip. If there is high clinical concern for nondisplaced fracture, consider further evaluation with CT. 2. Bilateral hip screws are unchanged in position. 3. Stable severe degenerative changes of the right  hip. Electronically Signed   By: Ronney Asters M.D.   On: 11/24/2021 19:59    EKG: Independently reviewed.  Sinus rhythm at 61 bpm.  Low voltage multiple leads.  Nonspecific intraventricular conduction delay with QRS of 118.  Nonspecific T wave flattening.  Assessment/Plan Principal Problem:   Hypoglycemia Active Problems:   Hyperlipidemia with target LDL less than 100   OSA (obstructive sleep apnea)   Essential hypertension   Chronic diastolic CHF (congestive heart failure) (HCC)   Hypothyroidism   Iron deficiency anemia   Thiamine deficiency   Long-term current use of opiate analgesic   Therapeutic opioid-induced constipation (OIC)   Class 3 obesity with alveolar hypoventilation, serious comorbidity, and body mass index (BMI) of 50.0 to 59.9 in adult [facility])   Insulin-requiring or dependent type II diabetes mellitus ([place])   Fall Hypoglycemia Diabetes > Patient presented after a fall out of his wheelchair while traveling on the sidewalk. > Found to be hypoglycemic to 33 on scene.  Improved with juice on the scene. > Recurrent hypoglycemia in the ED.  Does take high doses of insulin.  Started on a low-dose dextrose drip in  the ED. > Has not had much to eat today is likely the primary reason for hypoglycemia. > X-ray negative for hip fracture in the ED though study limited due to patient's size. - Being monitored on telemetry - We will hold insulin today - Carb modified diet - Continue with statin for now - CBG every 2 hours  OSA OHS Hypercarbia > Found to be hypercarbic with acidemia.  Initial pH 7.18 and PCO2 75.  Started on BiPAP and this improved to normal pH and PCO2 of 64. > Suspect this is due to his significant OSA and OHS with obesity.  Likely got exacerbated when he became less conscious from his hypoglycemia and worsened due to his inability to ventilate. > Improving on BiPAP, will continue this for now. - Monitor in stepdown unit - Continue BiPAP - Wean as tolerated  to oxygen and then off oxygen will likely need to be on BiPAP overnight  Hypothyroidism - Continue home Synthroid  Hypertension - Continue home bisoprolol and Lasix  CHF > Diastolic, last echo in 8889 with EF 55-60% not commenting on diastolic function. > Some congestion noted on CXR - Continue home Lasix and bisoprolol  Hyperlipidemia - Continue home atorvastatin  Thiamine deficiency - Continue home thiamine  Chronic pain Chronic opioid medication use Opioid constipation - Continue home Norco - Continue home methylnaltrexone bromide  DVT prophylaxis: Lovenox Code Status:   DNR Family Communication:  None on admission.  Disposition Plan:   Patient is from:  Home  Anticipated DC to:  Home  Anticipated DC date:  1 to 2 days  Anticipated DC barriers: None  Consults called:  None Admission status:  Observation, stepdown  Severity of Illness: The appropriate patient status for this patient is OBSERVATION. Observation status is judged to be reasonable and necessary in order to provide the required intensity of service to ensure the patient's safety. The patient's presenting symptoms, physical exam findings, and initial radiographic and laboratory data in the context of their medical condition is felt to place them at decreased risk for further clinical deterioration. Furthermore, it is anticipated that the patient will be medically stable for discharge from the hospital within 2 midnights of admission.    Marcelyn Bruins MD [facility]  How to contact the [facility] Attending or Consulting provider [place] or covering provider during after hours [place], for this patient?   Check the care team in [facility] and look for a) attending/consulting TR[facility] provider listed and b) the [facility] team listed Log into [email or web address] and use [place]'s universal password to access. If you do not have the password, please contact the hospital operator. Locate the [facility] provider you are looking for  under [facility] and page to a number that you can be directly reached. If you still have difficulty reaching the provider, please page the [facility] (Director on Call) for the Hospitalists listed on amion for assistance.  11/24/2021, 11:55 PM

## 2021-11-25 DIAGNOSIS — E1122 Type 2 diabetes mellitus with diabetic chronic kidney disease: Secondary | ICD-10-CM | POA: Diagnosis not present

## 2021-11-25 DIAGNOSIS — N1832 Chronic kidney disease, stage 3b: Secondary | ICD-10-CM | POA: Diagnosis present

## 2021-11-25 DIAGNOSIS — E875 Hyperkalemia: Secondary | ICD-10-CM | POA: Diagnosis present

## 2021-11-25 DIAGNOSIS — E872 Acidosis, unspecified: Secondary | ICD-10-CM | POA: Diagnosis present

## 2021-11-25 DIAGNOSIS — J9601 Acute respiratory failure with hypoxia: Secondary | ICD-10-CM | POA: Diagnosis not present

## 2021-11-25 DIAGNOSIS — W050XXA Fall from non-moving wheelchair, initial encounter: Secondary | ICD-10-CM | POA: Diagnosis present

## 2021-11-25 DIAGNOSIS — R531 Weakness: Secondary | ICD-10-CM | POA: Diagnosis not present

## 2021-11-25 DIAGNOSIS — R55 Syncope and collapse: Secondary | ICD-10-CM

## 2021-11-25 DIAGNOSIS — E11649 Type 2 diabetes mellitus with hypoglycemia without coma: Secondary | ICD-10-CM | POA: Diagnosis not present

## 2021-11-25 DIAGNOSIS — E161 Other hypoglycemia: Secondary | ICD-10-CM | POA: Diagnosis not present

## 2021-11-25 DIAGNOSIS — M150 Primary generalized (osteo)arthritis: Secondary | ICD-10-CM | POA: Diagnosis not present

## 2021-11-25 DIAGNOSIS — E1165 Type 2 diabetes mellitus with hyperglycemia: Secondary | ICD-10-CM | POA: Diagnosis not present

## 2021-11-25 DIAGNOSIS — J9602 Acute respiratory failure with hypercapnia: Secondary | ICD-10-CM | POA: Diagnosis not present

## 2021-11-25 DIAGNOSIS — E139 Other specified diabetes mellitus without complications: Secondary | ICD-10-CM | POA: Diagnosis not present

## 2021-11-25 DIAGNOSIS — E662 Morbid (severe) obesity with alveolar hypoventilation: Secondary | ICD-10-CM | POA: Diagnosis present

## 2021-11-25 DIAGNOSIS — Z20822 Contact with and (suspected) exposure to covid-19: Secondary | ICD-10-CM | POA: Diagnosis not present

## 2021-11-25 DIAGNOSIS — E039 Hypothyroidism, unspecified: Secondary | ICD-10-CM | POA: Diagnosis not present

## 2021-11-25 DIAGNOSIS — E785 Hyperlipidemia, unspecified: Secondary | ICD-10-CM | POA: Diagnosis present

## 2021-11-25 DIAGNOSIS — E162 Hypoglycemia, unspecified: Secondary | ICD-10-CM | POA: Diagnosis not present

## 2021-11-25 DIAGNOSIS — Z66 Do not resuscitate: Secondary | ICD-10-CM | POA: Diagnosis not present

## 2021-11-25 DIAGNOSIS — I5032 Chronic diastolic (congestive) heart failure: Secondary | ICD-10-CM | POA: Diagnosis present

## 2021-11-25 DIAGNOSIS — G9341 Metabolic encephalopathy: Secondary | ICD-10-CM | POA: Diagnosis not present

## 2021-11-25 DIAGNOSIS — K5903 Drug induced constipation: Secondary | ICD-10-CM | POA: Diagnosis present

## 2021-11-25 DIAGNOSIS — Z6841 Body Mass Index (BMI) 40.0 and over, adult: Secondary | ICD-10-CM | POA: Diagnosis not present

## 2021-11-25 DIAGNOSIS — R609 Edema, unspecified: Secondary | ICD-10-CM | POA: Diagnosis not present

## 2021-11-25 DIAGNOSIS — E1151 Type 2 diabetes mellitus with diabetic peripheral angiopathy without gangrene: Secondary | ICD-10-CM | POA: Diagnosis present

## 2021-11-25 DIAGNOSIS — T402X5A Adverse effect of other opioids, initial encounter: Secondary | ICD-10-CM | POA: Diagnosis present

## 2021-11-25 DIAGNOSIS — E519 Thiamine deficiency, unspecified: Secondary | ICD-10-CM | POA: Diagnosis present

## 2021-11-25 DIAGNOSIS — I130 Hypertensive heart and chronic kidney disease with heart failure and stage 1 through stage 4 chronic kidney disease, or unspecified chronic kidney disease: Secondary | ICD-10-CM | POA: Diagnosis not present

## 2021-11-25 DIAGNOSIS — D509 Iron deficiency anemia, unspecified: Secondary | ICD-10-CM | POA: Diagnosis not present

## 2021-11-25 DIAGNOSIS — J449 Chronic obstructive pulmonary disease, unspecified: Secondary | ICD-10-CM | POA: Diagnosis not present

## 2021-11-25 DIAGNOSIS — Z7401 Bed confinement status: Secondary | ICD-10-CM | POA: Diagnosis not present

## 2021-11-25 LAB — GLUCOSE, CAPILLARY
Glucose-Capillary: 23 mg/dL — CL (ref 70–99)
Glucose-Capillary: 53 mg/dL — ABNORMAL LOW (ref 70–99)
Glucose-Capillary: 66 mg/dL — ABNORMAL LOW (ref 70–99)
Glucose-Capillary: 72 mg/dL (ref 70–99)
Glucose-Capillary: 46 mg/dL — ABNORMAL LOW (ref 70–99)
Glucose-Capillary: 76 mg/dL (ref 70–99)
Glucose-Capillary: 68 mg/dL — ABNORMAL LOW (ref 70–99)
Glucose-Capillary: 68 mg/dL — ABNORMAL LOW (ref 70–99)
Glucose-Capillary: 71 mg/dL (ref 70–99)
Glucose-Capillary: 93 mg/dL (ref 70–99)
Glucose-Capillary: 88 mg/dL (ref 70–99)
Glucose-Capillary: 91 mg/dL (ref 70–99)
Glucose-Capillary: 112 mg/dL — ABNORMAL HIGH (ref 70–99)
Glucose-Capillary: 131 mg/dL — ABNORMAL HIGH (ref 70–99)
Glucose-Capillary: 155 mg/dL — ABNORMAL HIGH (ref 70–99)

## 2021-11-25 LAB — COMPREHENSIVE METABOLIC PANEL
ALT: 17 U/L (ref 0–44)
AST: 23 U/L (ref 15–41)
Albumin: 2.6 g/dL — ABNORMAL LOW (ref 3.5–5.0)
Alkaline Phosphatase: 60 U/L (ref 38–126)
Anion gap: 6 (ref 5–15)
BUN: 44 mg/dL — ABNORMAL HIGH (ref 8–23)
CO2: 24 mmol/L (ref 22–32)
Calcium: 8.1 mg/dL — ABNORMAL LOW (ref 8.9–10.3)
Chloride: 106 mmol/L (ref 98–111)
Creatinine, Ser: 2.13 mg/dL — ABNORMAL HIGH (ref 0.61–1.24)
GFR, Estimated: 34 mL/min — ABNORMAL LOW (ref >60)
Glucose, Bld: 75 mg/dL (ref 70–99)
Potassium: 4.5 mmol/L (ref 3.5–5.1)
Sodium: 136 mmol/L (ref 135–145)
Total Bilirubin: 0.4 mg/dL (ref 0.3–1.2)
Total Protein: 6.8 g/dL (ref 6.5–8.1)

## 2021-11-25 LAB — CBC
HCT: 32.6 % — ABNORMAL LOW (ref 39.0–52.0)
Hemoglobin: 9.2 g/dL — ABNORMAL LOW (ref 13.0–17.0)
MCH: 28.5 pg (ref 26.0–34.0)
MCHC: 28.2 g/dL — ABNORMAL LOW (ref 30.0–36.0)
MCV: 100.9 fL — ABNORMAL HIGH (ref 80.0–100.0)
Platelets: 175 10*3/uL (ref 150–400)
RBC: 3.23 MIL/uL — ABNORMAL LOW (ref 4.22–5.81)
RDW: 18 % — ABNORMAL HIGH (ref 11.5–15.5)
WBC: 5 10*3/uL (ref 4.0–10.5)
nRBC: 0 % (ref 0.0–0.2)

## 2021-11-25 LAB — BLOOD GAS, VENOUS
Acid-base deficit: 2.2 mmol/L — ABNORMAL HIGH (ref 0.0–2.0)
Bicarbonate: 28 mmol/L (ref 20.0–28.0)
O2 Saturation: 66.4 %
Patient temperature: 37
pCO2, Ven: 75 mmHg (ref 44–60)
pH, Ven: 7.18 — CL (ref 7.25–7.43)
pO2, Ven: 43 mmHg (ref 32–45)

## 2021-11-25 LAB — MRSA NEXT GEN BY PCR, NASAL: MRSA by PCR Next Gen: NOT DETECTED

## 2021-11-25 MED ORDER — DEXTROSE 50 % IV SOLN: 25.0000 g | INTRAVENOUS | Status: AC

## 2021-11-25 MED ORDER — DEXTROSE 50 % IV SOLN
12.5000 g | INTRAVENOUS | Status: AC
  Administered 2021-11-25: 05:00:00 12.5 g via INTRAVENOUS

## 2021-11-25 MED ORDER — DEXTROSE 50 % IV SOLN
12.5000 g | INTRAVENOUS | Status: AC
  Administered 2021-11-25: 03:00:00 12.5 g via INTRAVENOUS

## 2021-11-25 MED ORDER — DEXTROSE 50 % IV SOLN
0.5000 | INTRAVENOUS | Status: DC | PRN
  Administered 2021-11-25: 04:00:00 50 mL via INTRAVENOUS
  Administered 2021-11-25: 03:00:00 25 mL via INTRAVENOUS
  Filled 2021-11-25 (×2): qty 50

## 2021-11-25 MED ORDER — DEXTROSE 50 % IV SOLN
INTRAVENOUS | Status: AC
  Administered 2021-11-25: 02:00:00 25 g via INTRAVENOUS
  Filled 2021-11-25: qty 50

## 2021-11-25 MED ORDER — CHLORHEXIDINE GLUCONATE 0.12 % MT SOLN
15.0000 mL | Freq: Two times a day (BID) | OROMUCOSAL | Status: DC
  Administered 2021-11-25 – 2021-12-01 (×12): 15 mL via OROMUCOSAL
  Filled 2021-11-25 (×12): qty 15

## 2021-11-25 MED ORDER — DEXTROSE 50 % IV SOLN
INTRAVENOUS | Status: AC
Start: 2021-11-25 — End: 2021-11-25
  Filled 2021-11-25: qty 50

## 2021-11-25 MED ORDER — DEXTROSE 10 % IV SOLN: INTRAVENOUS | Status: DC

## 2021-11-25 MED ORDER — CHLORHEXIDINE GLUCONATE CLOTH 2 % EX PADS
6.0000 | MEDICATED_PAD | Freq: Every day | CUTANEOUS | Status: DC
  Administered 2021-11-25 – 2021-11-27 (×5): 6 via TOPICAL

## 2021-11-25 MED ORDER — TRAMADOL HCL 50 MG PO TABS
50.0000 mg | ORAL_TABLET | Freq: Once | ORAL | Status: AC
  Administered 2021-11-25: 22:00:00 50 mg via ORAL
  Filled 2021-11-25: qty 1

## 2021-11-25 MED ORDER — LEVOTHYROXINE SODIUM 75 MCG PO TABS
75.0000 ug | ORAL_TABLET | Freq: Every day | ORAL | Status: DC
  Administered 2021-11-25 – 2021-11-30 (×6): 75 ug via ORAL
  Filled 2021-11-25 (×6): qty 1

## 2021-11-25 MED ORDER — ORAL CARE MOUTH RINSE
15.0000 mL | Freq: Two times a day (BID) | OROMUCOSAL | Status: DC
  Administered 2021-11-25 – 2021-11-30 (×6): 15 mL via OROMUCOSAL

## 2021-11-25 MED ORDER — DEXTROSE 10 % IV SOLN: INTRAVENOUS | Status: AC

## 2021-11-25 MED ORDER — DEXTROSE 50 % IV SOLN
12.5000 g | INTRAVENOUS | Status: AC
  Administered 2021-11-25: 07:00:00 12.5 g via INTRAVENOUS

## 2021-11-25 NOTE — Assessment & Plan Note (Signed)
He needs lifestyle modification. ?

## 2021-11-25 NOTE — Assessment & Plan Note (Signed)
Hemoglobin stable 

## 2021-11-25 NOTE — Assessment & Plan Note (Addendum)
Present with severe hypoglycemia, symptomatic. Off D 10.  Started on SSI. Started levemir low dose 10 units.

## 2021-11-25 NOTE — Progress Notes (Addendum)
?Progress Note ? ? ?[name] NTZ:[id number] DOB: 05-Oct-1955 DOA: 11/24/2021     0 ?DOS: the patient was seen and examined on 11/25/2021 ?  ?Brief hospital course: ?66 year old past medical history significant for ventral hernia, left foot ulcer, opioid-induced constipation, chronic opioid medication use, vitamin deficiency, diabetes on insulin, peripheral artery disease, OSA, OHS, anemia, hypothyroidism, hyperlipidemia, hypertension, neuropathy, history of colon cancer 2014, CHF presents after a fall.  Patient fell out of his wheelchair on the sidewalk.  He lost consciousness.  He was evaluated by EMS and was found to have a blood glucose of 33.  Evaluation in the ED blood sugar was low, he was also found to have acute respiratory failure hypercapnic with a PCO2 of 75.  He was placed on BiPAP.  Subsequently pH improved to 7.2 and PCO2 decreased to 64. ? ?He was a started on D10 drip for hypoglycemia. ? ?Assessment and Plan: ?* Hypoglycemia ?In the setting of poor oral intake, and significant amount of insulin use. ?He used 200 units of Levemir.  ?Synjardy was discontinued with PCP 2 days prior to admission.  Also Mounjaro was discontinue, he didn't tolerate it.  ?Continue to hold insulin.  He will needed significant lower dose ? ? ?Syncope ?Related to hypoglycemia and Hypercapnic respiratory failure.  ? ?Stage 3b chronic kidney disease (CKD) ([place]) ?Stable. Cr baseline 2.1--2.2 ? ?Acute respiratory failure with hypoxia and hypercapnia (HCC) ?Patient presented with hypercapnic respiratory failure secondary to OSA, OHS, hypoglycemia ?She had pH 7.1 PCO2 75.  After he was on BiPAP pH improved to 7.2 and PCO2 decreased to 64 ?He will benefit from BiPAP at home.  We will place order for case management to assist ? ?Acute metabolic encephalopathy ?Secondary to hypoglycemia and hypercapnic respiratory failure ?Improved  with D10 and BiPAP ? ? ?Hypocalcemia ?8.1 ? ?Fall (on)(from) sidewalk curb, initial  encounter ?X-ray negative for fracture. ?PT eval ? ?Hyperkalemia ?Received kayexalate. Resolved.  ? ?Insulin-requiring or dependent type II diabetes mellitus ([place]) ?Present with severe hypoglycemia symptomatic. ?Continue to hold insulin. ?On D10 ? ?Class 3 obesity with alveolar hypoventilation, serious comorbidity, and body mass index (BMI) of 50.0 to 59.9 in adult [facility]) ?He needs lifestyle modification. ? ?Long-term current use of opiate analgesic ?Opioids held due to altered mental status and hypercarbic respiratory failure ? ?Iron deficiency anemia ?Hemoglobin stable ? ?Hypothyroidism ?Continue with Synthroid 75 mcg daily ? ?Chronic diastolic CHF (congestive heart failure) ([place]) ?Continue with Lasix 100 mg p.o. twice daily. ?Monitor Renal function ? ?Essential hypertension ?Continue with bisoprolol and Lasix ? ?Hyperlipidemia with target LDL less than 100 ?Continue with Lipitor ? ? ? ? ?  ? ?Subjective: he is alert, denies pain,. He would like to eat. He is breathing ok. He doesn't have BIPAP at home.  ?He didn't eat yesterday,.  ? ? ?Physical Exam: ?Vitals:  ? 11/25/21 0900 11/25/21 1000 11/25/21 1100 11/25/21 1135  ?BP: 106/88 (!) 118/56 (!) 109/50   ?Pulse: (!) 57 67 64 71  ?Resp: (!) 22 19 (!) 21 19  ?Temp: 98.2 ?F (36.8 ?C)   98.4 ?F (36.9 ?C)  ?TempSrc: Oral   Axillary  ?SpO2: 96% 97% (!) 74% 96%  ?Weight:      ?Height:      ? ?General; NAD ?Lung; CTA ?Abdomen; soft, nt, nd ?Neuro; alert following command.  ? ?Data Reviewed: ? ?CBC and B med review, blood gas reviewed. ? ?Family Communication: Care discussed with patient ? ?Disposition: ?Status is: Inpatient ?Remains inpatient appropriate because:  Admitted with acute hypercapnic respiratory failure, hypoglycemia ? Planned Discharge Destination: Home ? ? ? ?Time spent: 45 minutes ? ?Author: ?Elmarie Shiley, MD ?11/25/2021 2:14 PM ? ?For on call review [email or web address].  ?

## 2021-11-25 NOTE — Assessment & Plan Note (Signed)
8.1 ?

## 2021-11-25 NOTE — Assessment & Plan Note (Signed)
Related to hypoglycemia and Hypercapnic respiratory failure.  ?

## 2021-11-25 NOTE — Assessment & Plan Note (Addendum)
Continue with Lasix 100 mg p.o. twice daily. Monitor Renal function. Renal function stable continue with lasix

## 2021-11-25 NOTE — Assessment & Plan Note (Addendum)
X-ray negative for fracture. ?PT eval. Home health will be arrange.  ?

## 2021-11-25 NOTE — Assessment & Plan Note (Signed)
Stable. Cr baseline 2.1--2.2 ?

## 2021-11-25 NOTE — Assessment & Plan Note (Addendum)
Secondary to hypoglycemia and hypercapnic respiratory failure. Improved  with D10 and BiPAP. He is alert and conversant.

## 2021-11-25 NOTE — Progress Notes (Signed)
? ? ?  OVERNIGHT PROGRESS REPORT ? ?Notified by RN for continued hypoglycemia requiring dextrose pushes and addition of D10. ?D10 has been increased in increments during this and D5 adjusted to limit fluid intake. ?CBGs appear to be fluctuating less but still low. ? ? ? ? ?Gershon Cull MSNA MSN ACNPC-AG ?Acute Care Nurse Practitioner ?[facility] ?[place] ? ? ? ?

## 2021-11-25 NOTE — Clinical Social Work Note (Signed)
?  Transition of Care (TOC) Screening Note ? ? ?Patient Details  ?Name: [name] ?Date of Birth: [birth year] ? ? ?Transition of Care (TOC) CM/SW Contact:    ?Trish Mage, LCSW ?Phone Number: ?11/25/2021, 10:55 AM ? ? ? ?Transition of Care Department [facility]) has reviewed patient and no TOC needs have been identified at this time. We will continue to monitor patient advancement through interdisciplinary progression rounds. If new patient transition needs arise, please place a TOC consult. ? ? ?

## 2021-11-25 NOTE — Hospital Course (Addendum)
66 year old past medical history significant for ventral hernia, left foot ulcer, opioid-induced constipation, chronic opioid medication use, vitamin deficiency, diabetes on insulin, peripheral artery disease, OSA, OHS, anemia, hypothyroidism, hyperlipidemia, hypertension, neuropathy, history of colon cancer 2014, CHF presents after a fall.  Patient fell out of his wheelchair on the sidewalk.  He lost consciousness.  He was evaluated by EMS and was found to have a blood glucose of 33.  Evaluation in the ED blood sugar was low, he was also found to have acute respiratory failure hypercapnic with a PCO2 of 75.  He was placed on BiPAP.  Subsequently pH improved to 7.2 and PCO2 decreased to 64. ? ?He was a started on D10 drip for hypoglycemia. ?He has been off D 10 IV fluids. Remain stable on BIPAP at HS>  ?Blood gas was obtain yesterday after been several day on BIPAP at HS. He will need home oxygen.  ? ?Plan to discharge home 3/14. ?

## 2021-11-25 NOTE — Assessment & Plan Note (Signed)
Continue with bisoprolol and Lasix ?

## 2021-11-25 NOTE — Assessment & Plan Note (Addendum)
Continue with Synthroid 75 mcg daily. ?

## 2021-11-25 NOTE — Assessment & Plan Note (Signed)
Continue with core 

## 2021-11-25 NOTE — Plan of Care (Signed)

## 2021-11-25 NOTE — Progress Notes (Addendum)
Inpatient Diabetes Program Recommendations ? ?AACE/ADA: New Consensus Statement on Inpatient Glycemic Control (2015) ? ?Target Ranges:  Prepandial:   less than 140 mg/dL ?     Peak postprandial:   less than 180 mg/dL (1-2 hours) ?     Critically ill patients:  140 - 180 mg/dL  ? ?Lab Results  ?Component Value Date  ? GLUCAP 112 (H) 11/25/2021  ? HGBA1C 7.9 (H) 09/28/2021  ? ? ?Review of Glycemic Control ? Latest Reference Range & Units 11/25/21 04:24 11/25/21 04:29 11/25/21 05:04 11/25/21 05:40 11/25/21 06:19 11/25/21 07:05 11/25/21 09:16  ?Glucose-Capillary 70 - 99 mg/dL 68 (L) 71 68 (L) 93 88 91 112 (H)  ?(L): Data is abnormally low ?(H): Data is abnormally high ? ?Diabetes history: DM2 ?Outpatient Diabetes medications: Levemir 200 units QAM ?Current orders for Inpatient glycemic control: Dextrose 10% '@75'$  ml/hr ? ?Spoke with patient at bedside.  He states he only ate half of a sandwich yesterday which he believes led to his hypoglycemic episode.  He states his blood sugar was 30 mg/dL when EMS arrived.  He states his pcp, Dr. [name] reduced his Levemir to 200 units daily from 350 units daily 1 week ago.  She DC'd his Synjardy 2 days ago because "she said I had too much in my system".  He used Mounjaro for 1 week a couple of months ago but it made his stomach swell and created a large hard knot.   ? ?He has a new supply of the Freestyle Libre 2 at home which he just received.  He needs to be taught how to apply and use.  I can provide him with one tomorrow and teach him if ok with MD.  He states his doctor wants to monitor his blood sugar closely with the CGM to adjust insulin.  He states he only eats 1 meal a day.  He states he has been having lows x 4 months (likely why synjardy was discontinued).   ? ?He does not check his CBGs because he cannot get a sample from the 1 finger he uses.  He does not want to use any other fingers.   ? ?Will continue to follow while inpatient. ? ?Thank you, ?Reche Dixon, MSN,  RN ?Diabetes Coordinator ?Inpatient Diabetes Program ?[phone] (team pager from 8a-5p) ? ? ? ? ? ?

## 2021-11-25 NOTE — Assessment & Plan Note (Addendum)
In the setting of poor oral intake, and significant amount of insulin use. He used 200 units of Levemir.  Synjardy was discontinued with PCP 2 days prior to admission.  Also Mounjaro was discontinue, he didn't tolerate it.  CBG stable off D 10 IV fluids.  Started on SSI. Started levemir low dose. Monitor CBG>  CBG stable on 10 units levemir.

## 2021-11-25 NOTE — Assessment & Plan Note (Addendum)
Opioids held due to altered mental status and hypercarbic respiratory failure. ?On low dose norco. Dose reduce.  ?

## 2021-11-25 NOTE — Assessment & Plan Note (Addendum)
Patient presented with hypercapnic respiratory failure secondary to OSA, OHS, hypoglycemia ?He had pH 7.1 PCO2 75.  After he was on BiPAP pH improved to 7.2 and PCO2 decreased to 64. ?He will benefit from BiPAP at home. CM assisting with BIPAP ?Alert, tolerating BIPAP at HS>  ?He will need home oxygen as well.  ?Stable.  ?

## 2021-11-25 NOTE — Assessment & Plan Note (Signed)
Received kayexalate. Resolved.  ?

## 2021-11-26 LAB — BLOOD GAS, ARTERIAL
Acid-Base Excess: 6.6 mmol/L — ABNORMAL HIGH (ref 0.0–2.0)
Bicarbonate: 31.9 mmol/L — ABNORMAL HIGH (ref 20.0–28.0)
O2 Saturation: 91.1 %
Patient temperature: 37
pCO2 arterial: 47 mmHg (ref 32–48)
pH, Arterial: 7.44 (ref 7.35–7.45)
pO2, Arterial: 54 mmHg — ABNORMAL LOW (ref 83–108)

## 2021-11-26 LAB — BASIC METABOLIC PANEL
Anion gap: 5 (ref 5–15)
BUN: 43 mg/dL — ABNORMAL HIGH (ref 8–23)
CO2: 27 mmol/L (ref 22–32)
Calcium: 8.2 mg/dL — ABNORMAL LOW (ref 8.9–10.3)
Chloride: 105 mmol/L (ref 98–111)
Creatinine, Ser: 2.18 mg/dL — ABNORMAL HIGH (ref 0.61–1.24)
GFR, Estimated: 33 mL/min — ABNORMAL LOW (ref >60)
Glucose, Bld: 133 mg/dL — ABNORMAL HIGH (ref 70–99)
Potassium: 4.7 mmol/L (ref 3.5–5.1)
Sodium: 137 mmol/L (ref 135–145)

## 2021-11-26 LAB — CBC
HCT: 37 % — ABNORMAL LOW (ref 39.0–52.0)
Hemoglobin: 10.9 g/dL — ABNORMAL LOW (ref 13.0–17.0)
MCH: 28.1 pg (ref 26.0–34.0)
MCHC: 29.5 g/dL — ABNORMAL LOW (ref 30.0–36.0)
MCV: 95.4 fL (ref 80.0–100.0)
Platelets: 146 10*3/uL — ABNORMAL LOW (ref 150–400)
RBC: 3.88 MIL/uL — ABNORMAL LOW (ref 4.22–5.81)
RDW: 17.6 % — ABNORMAL HIGH (ref 11.5–15.5)
WBC: 5 10*3/uL (ref 4.0–10.5)
nRBC: 0 % (ref 0.0–0.2)

## 2021-11-26 LAB — GLUCOSE, CAPILLARY
Glucose-Capillary: 129 mg/dL — ABNORMAL HIGH (ref 70–99)
Glucose-Capillary: 135 mg/dL — ABNORMAL HIGH (ref 70–99)
Glucose-Capillary: 172 mg/dL — ABNORMAL HIGH (ref 70–99)
Glucose-Capillary: 175 mg/dL — ABNORMAL HIGH (ref 70–99)
Glucose-Capillary: 235 mg/dL — ABNORMAL HIGH (ref 70–99)
Glucose-Capillary: 241 mg/dL — ABNORMAL HIGH (ref 70–99)

## 2021-11-26 MED ORDER — HYDROCODONE-ACETAMINOPHEN 5-325 MG PO TABS
1.0000 | ORAL_TABLET | Freq: Four times a day (QID) | ORAL | Status: DC | PRN
  Administered 2021-11-26 – 2021-11-30 (×13): 1 via ORAL
  Filled 2021-11-26 (×14): qty 1

## 2021-11-26 NOTE — Progress Notes (Addendum)
?Progress Note ? ? ?PatientNicholas [name] WCB:[id number] DOB: 1956/02/21 DOA: 11/24/2021     1 ?DOS: the patient was seen and examined on 11/26/2021 ?  ?Brief hospital course: ?66 year old past medical history significant for ventral hernia, left foot ulcer, opioid-induced constipation, chronic opioid medication use, vitamin deficiency, diabetes on insulin, peripheral artery disease, OSA, OHS, anemia, hypothyroidism, hyperlipidemia, hypertension, neuropathy, history of colon cancer 2014, CHF presents after a fall.  Patient fell out of his wheelchair on the sidewalk.  He lost consciousness.  He was evaluated by EMS and was found to have a blood glucose of 33.  Evaluation in the ED blood sugar was low, he was also found to have acute respiratory failure hypercapnic with a PCO2 of 75.  He was placed on BiPAP.  Subsequently pH improved to 7.2 and PCO2 decreased to 64. ? ?He was a started on D10 drip for hypoglycemia. ? ?Plan to discontinue D 10, he is more alert, improved with BIPAP>  ? ?Assessment and Plan: ?* Hypoglycemia ?In the setting of poor oral intake, and significant amount of insulin use. ?He used 200 units of Levemir.  ?Synjardy was discontinued with PCP 2 days prior to admission.  Also Mounjaro was discontinue, he didn't tolerate it.  ?Continue to hold insulin.  He will needed significant lower dose. ?Plan to monitor off D 5.  ? ? ?Syncope ?Related to hypoglycemia and Hypercapnic respiratory failure.  ? ?Stage 3b chronic kidney disease (CKD) ([place]) ?Stable. Cr baseline 2.1--2.2 ? ?Acute respiratory failure with hypoxia and hypercapnia (HCC) ?Patient presented with hypercapnic respiratory failure secondary to OSA, OHS, hypoglycemia ?She had pH 7.1 PCO2 75.  After he was on BiPAP pH improved to 7.2 and PCO2 decreased to 64 ?He will benefit from BiPAP at home.  We will place order for case management to assist ?Alert, tolerating BIPAP at HS>  ? ?Acute metabolic encephalopathy ?Secondary to hypoglycemia and  hypercapnic respiratory failure ?Improved  with D10 and BiPAP. ?He is alert and conversant.  ? ? ?Hypocalcemia ?8.1 ? ?Fall (on)(from) sidewalk curb, initial encounter ?X-ray negative for fracture. ?PT eval ? ?Hyperkalemia ?Received kayexalate. Resolved.  ? ?Insulin-requiring or dependent type II diabetes mellitus ([place]) ?Present with severe hypoglycemia, symptomatic. ?Continue to hold insulin. ?Plan to discontinue D 10, monitor of IV fluids.  ? ?Class 3 obesity with alveolar hypoventilation, serious comorbidity, and body mass index (BMI) of 50.0 to 59.9 in adult [facility]) ?He needs lifestyle modification. ? ?Long-term current use of opiate analgesic ?Opioids held due to altered mental status and hypercarbic respiratory failure ?Plan to resume lower dose norco.  ? ?Iron deficiency anemia ?Hemoglobin stable ? ?Hypothyroidism ?Continue with Synthroid 75 mcg daily. ? ?Chronic diastolic CHF (congestive heart failure) ([place]) ?Continue with Lasix 100 mg p.o. twice daily. ?Monitor Renal function. ?Stable.  ? ?Essential hypertension ?Continue with bisoprolol and Lasix ? ?Hyperlipidemia with target LDL less than 100 ?Continue with Lipitor ? ? ? ? ?  ? ?Subjective: he is more alert, feels better.  ?He has only been eating once a day at home, when he remember to eat, he has been having a lot of stress recently  ? ?Physical Exam: ?Vitals:  ? 11/26/21 1200 11/26/21 1300 11/26/21 1400 11/26/21 1516  ?BP: (!) 153/65 (!) 149/68 (!) 158/63   ?Pulse: 71 70 71   ?Resp: 17 16 15    ?Temp: 98.3 ?F (36.8 ?C)   98.5 ?F (36.9 ?C)  ?TempSrc: Oral   Oral  ?SpO2: 93% (!) 89% 91%   ?Weight:      ?  Height:      ? ?General; NAD ?Lung; CTA ?Abdomen; soft , nt, nd ? ?Data Reviewed: ? ?B-met and cbc  ? ?Family Communication: Care discussed with patient.  ? ?Disposition: ?Status is: Inpatient ?Remains inpatient appropriate because: admitted with hypoglycemia, hypercapnic respiratory.  ? Planned Discharge Destination: Home ? ? ? ?Time spent: 45  minutes ? ?Author: ?Elmarie Shiley, MD ?11/26/2021 3:59 PM ? ?For on call review [email or web address].  ?

## 2021-11-26 NOTE — Progress Notes (Signed)
The patient requested to speak with social work here in the hospital prior to discharge to discuss the use of a wheelchair to get home in (patient will use the [facility] transport service to get home but does not have his home electric scooter here at the hospital). This nurse will inform the oncoming nurses of this for social work discussion.  ?

## 2021-11-26 NOTE — TOC Initial Note (Addendum)
Transition of Care (TOC) - Initial/Assessment Note  ? ? ?Patient Details  ?Name: [name] ?MRN: [id number] ?Date of Birth: [birth year] ? ?Transition of Care (TOC) CM/SW Contact:    ?Trish Mage, LCSW ?Phone Number: ?11/26/2021, 12:06 PM ? ?Clinical Narrative:    CSW responding to consult for DME bi-pap.  Contacted Danielle with [facility] who will arrange for delvery of same to patient room. TOC will continue to follow during the course of hospitalization. ? ?Addendum: Got MD signature and additional orders per [facility] request ?             ? ? ?Expected Discharge Plan: Home/Self Care ?Barriers to Discharge: No Barriers Identified ? ? ?Patient Goals and CMS Choice ?  ?  ?  ? ?Expected Discharge Plan and Services ?Expected Discharge Plan: Home/Self Care ?  ?  ?  ?  ?                ?  ?  ?  ?  ?  ?  ?  ?  ?  ?  ? ?Prior Living Arrangements/Services ?  ?  ?  ?       ?  ?  ?  ?  ? ?Activities of Daily Living ?Home Assistive Devices/Equipment: Wheelchair ?ADL Screening (condition at time of admission) ?Patient's cognitive ability adequate to safely complete daily activities?: Yes ?Is the patient deaf or have difficulty hearing?: No ?Does the patient have difficulty seeing, even when wearing glasses/contacts?: No ?Does the patient have difficulty concentrating, remembering, or making decisions?: No ?Patient able to express need for assistance with ADLs?: Yes ?Does the patient have difficulty dressing or bathing?: Yes ?Independently performs ADLs?: Yes (appropriate for developmental age) ?Does the patient have difficulty walking or climbing stairs?: Yes ?Weakness of Legs: Both ?Weakness of Arms/Hands: Both ? ?Permission Sought/Granted ?  ?  ?   ?   ?   ?   ? ?Emotional Assessment ?  ?  ?  ?  ?  ?  ? ?Admission diagnosis:  Hypocalcemia [E83.51] ?Trauma [T14.90XA] ?Patient Active Problem List  ? Diagnosis Date Noted  ? Hypocalcemia 11/25/2021  ? Acute metabolic encephalopathy 93/90/3009  ? Acute respiratory failure with  hypoxia and hypercapnia ([place]) 11/25/2021  ? Stage 3b chronic kidney disease (CKD) ([place]) 11/25/2021  ? Syncope 11/25/2021  ? Hypoglycemia 11/24/2021  ? Fall (on)(from) sidewalk curb, initial encounter 11/24/2021  ? Hyperkalemia 09/28/2021  ? Insulin-requiring or dependent type II diabetes mellitus ([place]) 07/27/2021  ? Encounter for general adult medical examination with abnormal findings 04/06/2021  ? Calculus of gallbladder without cholecystitis without obstruction 04/06/2021  ? Chronic idiopathic constipation 11/09/2020  ? PAD (peripheral artery disease) ([place]) 07/24/2020  ? Ulcer of left foot, limited to breakdown of skin ([place]) 07/23/2020  ? Drug-induced erectile dysfunction 05/22/2019  ? Onychomycosis of toenail 05/21/2019  ? Class 3 obesity with alveolar hypoventilation, serious comorbidity, and body mass index (BMI) of 50.0 to 59.9 in adult [facility]delaz[facility]) 02/07/2019  ? Ventral hernia without obstruction or gangrene 05/31/2018  ? Therapeutic opioid-induced constipation (OIC) 05/31/2018  ? Long-term current use of opiate analgesic 02/15/2018  ? Left lumbar radiculopathy 01/29/2018  ? B12 deficiency 10/17/2017  ? Bleeding internal hemorrhoids   ? Insomnia 06/30/2017  ? Left eye affected by proliferative diabetic retinopathy with traction retinal detachment not involving macula, associated with type 2 diabetes mellitus ([place]) 04/20/2017  ? Right eye affected by proliferative diabetic retinopathy with traction retinal detachment involving macula, associated with type 2  diabetes mellitus ([place]) 04/20/2017  ? Vitamin D deficiency 12/23/2016  ? Thiamine deficiency 08/02/2016  ? Primary osteoarthritis involving multiple joints 08/03/2015  ? Diabetes mellitus type II, uncontrolled 05/23/2014  ? Herpesviral keratitis 05/23/2014  ? Diabetes mellitus due to underlying condition with both eyes affected by proliferative retinopathy without macular edema, with long-term current use of insulin ([place]) 05/23/2014  ? Diabetic foot ulcer  02/05/2014  ? Iron deficiency anemia 01/09/2014  ? Osteoarthritis of hip 05/08/2013  ? Diabetic nephropathy ([place]) 05/07/2013  ? Bariatric surgery status 01/29/2013  ? Benign prostatic hyperplasia without lower urinary tract symptoms 01/11/2013  ? Hypothyroidism 01/11/2013  ? Colon cancer ([place]) 10/04/2012  ? Chronic diastolic CHF (congestive heart failure) ([place]) 04/13/2012  ? Diabetic peripheral neuropathy ([place]) 05/29/2008  ? OSA (obstructive sleep apnea) 10/01/2007  ? CARDIOMYOPATHY 10/01/2007  ? Hyperlipidemia with target LDL less than 100 07/24/2007  ? Essential hypertension 07/24/2007  ? ?PCP:  [name], NP ?Pharmacy:   ?[facility], [address] ?[address] ?[address] ?[place] [address] ?Phone: [phone] Fax: [phone] ? ?[facility] [place] [place] - [address] ?[address] ?[place] [address] ?Phone: [phone] Fax: [phone] ? ?[place], [place] - [address] ?[place] ?[place] [address] ?Phone: [phone] Fax: [phone] ? ? ? ? ?Social Determinants of Health (SDOH) Interventions ?  ? ?Readmission Risk Interventions ?No flowsheet data found. ? ? ?

## 2021-11-26 NOTE — Progress Notes (Addendum)
Pt placed on overnight pulse ox with 4L O2 per MD order. Pt understood and resting well no resp distress noted. Pt cannot use bipap dream station tonight.  ?

## 2021-11-26 NOTE — Consult Note (Signed)
[facility] CM Inpatient Consult ? ? ?11/26/2021 ? ?Anthonymichael Otterson ?29-Oct-1955 ?[id number] ? ?[place] [facility] CM) ?  ?Patient chart reviewed with noted high risk score for unplanned readmission. Patient currently active with chronic care management team embedded at primary provider office. PCP office listed for post hospital transition of care calls.  ?  ?Plan: Will  continue to follow for progression and disposition.  ?  ?Of note, [facility] Care Management services does not replace or interfere with any services that are arranged by inpatient case management or social work.  ? ?Netta Cedars, MSN, RN ?[facility] Liaison ?Mobile Phone [phone]  ?Toll free office [phone]  ? ? ? ?

## 2021-11-26 NOTE — Clinical Social Work Note (Signed)
Mr. Fjeld presents with acute respiratory failure with Class 3 obesity with alveolar hypoventilation.  The use of the NIV will treat patient's high PC02 levels. NIV use can reduce risk of exacerbations and future hospitalizations when used at night and during the day.  All alternate devices [phone] and [id number]) have been proven ineffective to provide essential volume control necessary to maintain acceptable CO2 levels.  An NIV with volume-targeted pressure support is necessary to prevent patient from life-threatening harm.  Interruption or failure to provide NIV would quickly lead to exacerbation of the patient's condition, hospital admission, and likely harm to the patient. Continued use is preferred.  Patient is able to protect their airways and clear secretions on their own. ? ?

## 2021-11-27 DIAGNOSIS — M109 Gout, unspecified: Secondary | ICD-10-CM

## 2021-11-27 LAB — GLUCOSE, CAPILLARY
Glucose-Capillary: 190 mg/dL — ABNORMAL HIGH (ref 70–99)
Glucose-Capillary: 164 mg/dL — ABNORMAL HIGH (ref 70–99)
Glucose-Capillary: 228 mg/dL — ABNORMAL HIGH (ref 70–99)
Glucose-Capillary: 213 mg/dL — ABNORMAL HIGH (ref 70–99)
Glucose-Capillary: 278 mg/dL — ABNORMAL HIGH (ref 70–99)
Glucose-Capillary: 333 mg/dL — ABNORMAL HIGH (ref 70–99)

## 2021-11-27 LAB — CBC
HCT: 31.2 % — ABNORMAL LOW (ref 39.0–52.0)
Hemoglobin: 9.2 g/dL — ABNORMAL LOW (ref 13.0–17.0)
MCH: 28.3 pg (ref 26.0–34.0)
MCHC: 29.5 g/dL — ABNORMAL LOW (ref 30.0–36.0)
MCV: 96 fL (ref 80.0–100.0)
Platelets: 164 10*3/uL (ref 150–400)
RBC: 3.25 MIL/uL — ABNORMAL LOW (ref 4.22–5.81)
RDW: 17.4 % — ABNORMAL HIGH (ref 11.5–15.5)
WBC: 7.1 10*3/uL (ref 4.0–10.5)
nRBC: 0 % (ref 0.0–0.2)

## 2021-11-27 LAB — BASIC METABOLIC PANEL
Anion gap: 7 (ref 5–15)
BUN: 43 mg/dL — ABNORMAL HIGH (ref 8–23)
CO2: 29 mmol/L (ref 22–32)
Calcium: 8.2 mg/dL — ABNORMAL LOW (ref 8.9–10.3)
Chloride: 101 mmol/L (ref 98–111)
Creatinine, Ser: 2.24 mg/dL — ABNORMAL HIGH (ref 0.61–1.24)
GFR, Estimated: 32 mL/min — ABNORMAL LOW (ref >60)
Glucose, Bld: 170 mg/dL — ABNORMAL HIGH (ref 70–99)
Potassium: 4.4 mmol/L (ref 3.5–5.1)
Sodium: 137 mmol/L (ref 135–145)

## 2021-11-27 LAB — URIC ACID: Uric Acid, Serum: 11.5 mg/dL — ABNORMAL HIGH (ref 3.7–8.6)

## 2021-11-27 MED ORDER — ALLOPURINOL 100 MG PO TABS
100.0000 mg | ORAL_TABLET | Freq: Every day | ORAL | Status: DC
  Administered 2021-11-27 – 2021-12-01 (×5): 100 mg via ORAL
  Filled 2021-11-27 (×5): qty 1

## 2021-11-27 MED ORDER — INSULIN ASPART 100 UNIT/ML IJ SOLN
0.0000 [IU] | Freq: Three times a day (TID) | INTRAMUSCULAR | Status: DC
  Administered 2021-11-27: 2 [IU] via SUBCUTANEOUS
  Administered 2021-11-27: 1 [IU] via SUBCUTANEOUS
  Administered 2021-11-27 – 2021-11-28 (×2): 2 [IU] via SUBCUTANEOUS
  Administered 2021-11-28 – 2021-11-29 (×3): 1 [IU] via SUBCUTANEOUS
  Administered 2021-11-29 – 2021-11-30 (×4): 2 [IU] via SUBCUTANEOUS

## 2021-11-27 MED ORDER — COLCHICINE 0.3 MG HALF TABLET
0.3000 mg | ORAL_TABLET | Freq: Every day | ORAL | Status: DC
  Administered 2021-11-27 – 2021-12-01 (×5): 0.3 mg via ORAL
  Filled 2021-11-27 (×5): qty 1

## 2021-11-27 NOTE — Evaluation (Signed)
Physical Therapy Evaluation Patient Details Name: [name] MRN: [id number] DOB: [birth year] Today's Date: 11/27/2021  History of Present Illness  Patient is 66 y.o. male presented to ED via EMS after fall our of wheelchair, he had a LOC and was found ot be hypoglycemic. PMH significant for ventral hernia, left foot ulcer, opioid-induced constipation, chronic opioid medication use, vitamin deficiency, diabetes on insulin, peripheral artery disease, OSA, OHS, anemia, hypothyroidism, hyperlipidemia, hypertension, neuropathy, history of colon cancer 2014, CHF.    Clinical Impression  [name] is 66 y.o. male admitted with above HPI and diagnosis. Patient is currently limited by functional impairments below (see PT problem list). Patient lives alone and is modified independent with wheelchair for mobility. He reports for the last ~2 months he has been limited to stand pivot transfers to move from bed<>wc<>toilet. Patient will benefit from continued skilled PT interventions to address impairments and progress independence with mobility, recommending HHPT. Patient's old wheelchair is broken and unsafe, he will need a bariatric wheelchair and cushion to maintain his independence and ability to mobilize in the community and in his home. Acute PT will follow and progress as able.        Recommendations for follow up therapy are one component of a multi-disciplinary discharge planning process, led by the attending physician.  Recommendations may be updated based on patient status, additional functional criteria and insurance authorization.  Follow Up Recommendations Home health PT    Assistance Recommended at Discharge Intermittent Supervision/Assistance  Patient can return home with the following  Help with stairs or ramp for entrance;Assist for transportation;Assistance with cooking/housework    Equipment Recommendations Wheelchair (measurements PT);Wheelchair cushion (measurements PT)  (bariatric wheelchair (pt is ~550 lbs))  Recommendations for Other Services       Functional Status Assessment Patient has had a recent decline in their functional status and demonstrates the ability to make significant improvements in function in a reasonable and predictable amount of time.     Precautions / Restrictions Precautions Precautions: Fall Restrictions Weight Bearing Restrictions: No      Mobility  Bed Mobility Overal bed mobility: Needs Assistance Bed Mobility: Supine to Sit     Supine to sit: Min assist, Mod assist, HOB elevated, +2 for safety/equipment     General bed mobility comments: pt initiated moving LE's to EOB, assist required to bring Lt LE to edge fully. Pt required min assist for reaching to bed rail with Rt UE and was able to initiate pivoting trunk/hips to scoot to side of bed. Min-Mod Assist +2 for safety with raising trunk fully up and steadying.    Transfers Overall transfer level: Needs assistance Equipment used: None Transfers: Sit to/from Stand, Bed to chair/wheelchair/BSC Sit to Stand: Min guard, From elevated surface, +2 safety/equipment   Step pivot transfers: Min guard, From elevated surface, +2 safety/equipment       General transfer comment: Min guard for safety +2 to stand from EOB and take small steps to pivot to recliner. pt leaning forward with trunk flexed and support on recliner armrest. pt able to take small steps forward and turn with good sequencing/transition of hand placement as he turned to sit. no physical assist needed.    Ambulation/Gait                  Stairs            Wheelchair Mobility    Modified Rankin (Stroke Patients Only)       Balance Overall balance assessment: Needs  assistance Sitting-balance support: Feet supported, Bilateral upper extremity supported Sitting balance-Leahy Scale: Fair     Standing balance support: During functional activity, Single extremity supported Standing  balance-Leahy Scale: Fair Standing balance comment: no AD for transfers but pt reaching out for support of recliner armrest to steady self while turning                             Pertinent Vitals/Pain Pain Assessment Pain Assessment: No/denies pain    Home Living Family/patient expects to be discharged to:: Private residence Living Arrangements: Alone Available Help at Discharge: Family Type of Home: House Home Access: Ramped entrance       Home Layout: One level Home Equipment: Doctor, general practice Additional Comments: has adjustable bed    Prior Function Prior Level of Function : Independent/Modified Independent             Mobility Comments: transfers only, and electric scooter ADLs Comments: performs sink baths, uses a chair to scoot in to get to bathroom sink for baths and toilet, has been wearing gowns because he was struggling to reach his feet     Hand Dominance   Dominant Hand: Right    Extremity/Trunk Assessment   Upper Extremity Assessment Upper Extremity Assessment: Defer to OT evaluation    Lower Extremity Assessment Lower Extremity Assessment: RLE deficits/detail;LLE deficits/detail;Overall WFL for tasks assessed RLE Deficits / Details: 4+/5 for ankle dorsi/plantar flexion, knee extension, pt able to lift for partial SLR however habitus limited testing RLE Sensation: history of peripheral neuropathy RLE Coordination: WNL LLE Deficits / Details: 4+/5 for ankle dorsi/plantar flexion, knee extension, pt able to lift for partial SLR however habitus limited testing LLE Sensation: history of peripheral neuropathy LLE Coordination: WNL    Cervical / Trunk Assessment Cervical / Trunk Assessment: Normal  Communication   Communication: No difficulties  Cognition Arousal/Alertness: Awake/alert Behavior During Therapy: WFL for tasks assessed/performed Overall Cognitive Status: Within Functional Limits for tasks assessed                                  General Comments: pt is highly motivated and pleasant. eager to work with therapy.        General Comments      Exercises     Assessment/Plan    PT Assessment Patient needs continued PT services  PT Problem List Decreased activity tolerance;Decreased balance;Decreased mobility;Decreased knowledge of use of DME;Obesity       PT Treatment Interventions DME instruction;Gait training;Stair training;Functional mobility training;Therapeutic activities;Therapeutic exercise;Balance training;Patient/family education    PT Goals (Current goals can be found in the Care Plan section)  Acute Rehab PT Goals Patient Stated Goal: get wheelchair for transportation, be able to walk again to transfer more easily PT Goal Formulation: With patient Time For Goal Achievement: 12/11/21 Potential to Achieve Goals: Good    Frequency Min 3X/week     Co-evaluation PT/OT/SLP Co-Evaluation/Treatment: Yes Reason for Co-Treatment: For patient/therapist safety;To address functional/ADL transfers PT goals addressed during session: Mobility/safety with mobility;Balance OT goals addressed during session: ADL's and self-care;Strengthening/ROM       AM-PAC PT "6 Clicks" Mobility  Outcome Measure Help needed turning from your back to your side while in a flat bed without using bedrails?: A Little Help needed moving from lying on your back to sitting on the side of a flat bed without using bedrails?: A Little Help  needed moving to and from a bed to a chair (including a wheelchair)?: A Little Help needed standing up from a chair using your arms (e.g., wheelchair or bedside chair)?: A Little Help needed to walk in hospital room?: Total Help needed climbing 3-5 steps with a railing? : Total 6 Click Score: 14    End of Session Equipment Utilized During Treatment: Oxygen Activity Tolerance: Patient tolerated treatment well Patient left: in chair;with call bell/phone within  reach Nurse Communication: Mobility status PT Visit Diagnosis: Unsteadiness on feet (R26.81);Other abnormalities of gait and mobility (R26.89);Difficulty in walking, not elsewhere classified (R26.2)    Time: 1610-9604 PT Time Calculation (min) (ACUTE ONLY): 32 min   Charges:   PT Evaluation $PT Eval Moderate Complexity: 1 Mod          Wynn Maudlin, DPT Acute Rehabilitation Services Office [phone] Pager [phone]   Anitra Lauth 11/27/2021, 9:57 AM

## 2021-11-27 NOTE — Progress Notes (Signed)
SATURATION QUALIFICATIONS: (This note is used to comply with regulatory documentation for home oxygen) ? ?Patient Saturations on Room Air at Rest = 85% ? ?Patient Saturations on Room Air while Ambulating = N/A ? ?Patient Saturations on 4 Liters of oxygen while at Rest= 95% ? ?Please briefly explain why patient needs home oxygen: ?Pt unable to maintain safe oxygen saturations while resting when on room air.  RN placed pt on 4L O2 while patient in chair, O2 saturations returned to >92%. ?

## 2021-11-27 NOTE — Evaluation (Signed)
Occupational Therapy Evaluation ?Patient Details ?Name: [name] ?MRN: [id number] ?DOB: 05-Feb-1956 ?Today's Date: 11/27/2021 ? ? ?History of Present Illness Patient is 66 y.o. male presented to ED via EMS after fall our of wheelchair, he had a LOC and was found ot be hypoglycemic. PMH significant for ventral hernia, left foot ulcer, opioid-induced constipation, chronic opioid medication use, vitamin deficiency, diabetes on insulin, peripheral artery disease, OSA, OHS, anemia, hypothyroidism, hyperlipidemia, hypertension, neuropathy, history of colon cancer 2014, CHF.  ? ?Clinical Impression ?  ?[name] is a 66 year old male who presents with above medical history. On evaluation patient able to perform bed transfer use of bed rail and min guard to transfer to recliner. At baseline patient only transfers/pivots, has had increasing difficulty with LB ADLs and uses electric wheelchair for home and community mobility. Patient appears close to his baseline - with increased difficulty because of hospital environment. Patient does report pain in right index finger - potentially gout - therapist recommended ice for edema and gentle ROM. Patient found on 4 L [place] and o2 dropped to 85% on RA  at rest. May need home oxygen. From an OT standpoint patient has no acute care needs and patient cannot perform ADLs as he does at home. Recommend [place] OT services at discharge to assist patient with DME needs and learning compensatory strategies. ?   ? ?Recommendations for follow up therapy are one component of a multi-disciplinary discharge planning process, led by the attending physician.  Recommendations may be updated based on patient status, additional functional criteria and insurance authorization.  ? ?Follow Up Recommendations ? Home health OT  ?  ?Assistance Recommended at Discharge PRN  ?Patient can return home with the following Assistance with cooking/housework;Help with stairs or ramp for entrance;Assist for  transportation ? ?  ?Functional Status Assessment ? Patient has not had a recent decline in their functional status  ?Equipment Recommendations ? None recommended by OT  ?  ?Recommendations for Other Services   ? ? ?  ?Precautions / Restrictions Precautions ?Precautions: Fall ?Restrictions ?Weight Bearing Restrictions: No  ? ?  ? ?Mobility Bed Mobility ?  ?  ?  ?  ?  ?  ?  ?  ?  ? ?Transfers ?  ?  ?  ?  ?  ?  ?  ?  ?  ?  ?  ? ?  ?Balance   ?Sitting-balance support: Feet supported, Bilateral upper extremity supported ?Sitting balance-Leahy Scale: Good ?  ?  ?  ?Standing balance-Leahy Scale: Fair ?Standing balance comment: no AD for transfers but pt reaching out for support of recliner armrest to steady self while turning ?  ?  ?  ?  ?  ?  ?  ?  ?  ?  ?  ?   ? ?ADL either performed or assessed with clinical judgement  ? ?ADL Overall ADL's : Needs assistance/impaired ?Eating/Feeding: Independent ?  ?Grooming: Set up;Sitting ?  ?Upper Body Bathing: Set up;Sitting ?  ?Lower Body Bathing: Moderate assistance;Sitting/lateral leans ?Lower Body Bathing Details (indicate cue type and reason): struggled with LB bathing at baseline ?Upper Body Dressing : Set up;Sitting ?  ?Lower Body Dressing: Moderate assistance;Sitting/lateral leans ?Lower Body Dressing Details (indicate cue type and reason): needed assistance to don socks - typically only uses slides/crocs ?Toilet Transfer: Min guard;BSC/3in1 ?Toilet Transfer Details (indicate cue type and reason): pivots at baseline ?Toileting- Clothing Manipulation and Hygiene: Sitting/lateral lean;Min guard ?  ?  ?  ?Functional mobility during  ADLs: Min guard;+2 for safety/equipment ?   ? ? ? ?Vision Patient Visual Report: No change from baseline ?   ?   ?Perception   ?  ?Praxis   ?  ? ?Pertinent Vitals/Pain Pain Assessment ?Pain Assessment: 0-10 ?Pain Score: 10-Worst pain ever ?Pain Location: right index finger ?Pain Descriptors / Indicators: Grimacing, Guarding ?Pain Intervention(s):  Limited activity within patient's tolerance  ? ? ? ?Hand Dominance Right ?  ?Extremity/Trunk Assessment Upper Extremity Assessment ?Upper Extremity Assessment: RUE deficits/detail;LUE deficits/detail ?RUE Deficits / Details: WNL ROM except right index finger which was not tested due to pain, 5/5 strength except grip 4/5 ?RUE Sensation: WNL (intermittent tingling and tremors) ?RUE Coordination: WNL ?LUE Deficits / Details: WNL ROM, 5/5 strength except 4/5 grip ?LUE Sensation: WNL (intermittent tingling and tremors) ?LUE Coordination: WNL ?  ?Lower Extremity Assessment ?Lower Extremity Assessment: Defer to PT evaluation ?RLE Deficits / Details: 4+/5 for ankle dorsi/plantar flexion, knee extension, pt able to lift for partial SLR however habitus limited testing ?RLE Sensation: history of peripheral neuropathy ?RLE Coordination: WNL ?LLE Deficits / Details: 4+/5 for ankle dorsi/plantar flexion, knee extension, pt able to lift for partial SLR however habitus limited testing ?LLE Sensation: history of peripheral neuropathy ?LLE Coordination: WNL ?  ?Cervical / Trunk Assessment ?Cervical / Trunk Assessment: Normal ?  ?Communication Communication ?Communication: No difficulties ?  ?Cognition Arousal/Alertness: Awake/alert ?Behavior During Therapy: [facility] for tasks assessed/performed ?Overall Cognitive Status: Within Functional Limits for tasks assessed ?  ?  ?  ?  ?  ?  ?  ?  ?  ?  ?  ?  ?  ?  ?  ?  ?  ?  ?  ?General Comments    ? ?  ?Exercises   ?  ?Shoulder Instructions    ? ? ?Home Living Family/patient expects to be discharged to:: Private residence ?Living Arrangements: Alone ?Available Help at Discharge: Family ?Type of Home: House ?Home Access: Ramped entrance ?  ?  ?Home Layout: One level ?  ?  ?Bathroom Shower/Tub: Tub/shower unit ?  ?Bathroom Toilet: Standard (uses sink to push up on.) ?Bathroom Accessibility: No ?  ?Home Equipment: Shower seat;Electric scooter ?  ?Additional Comments: has adjustable bed ?  ? ?   ?Prior Functioning/Environment Prior Level of Function : Independent/Modified Independent ?  ?  ?  ?  ?  ?  ?Mobility Comments: transfers only, and electric scooter ?ADLs Comments: performs sink baths, uses a chair to scoot in to get to bathroom sink for baths and toilet, has been wearing gowns because he was struggling to reach his feet ?  ? ?  ?  ?OT Problem List: Impaired balance (sitting and/or standing);Decreased strength;Decreased range of motion;Decreased activity tolerance;Decreased knowledge of use of DME or AE;Cardiopulmonary status limiting activity;Obesity;Pain ?  ?   ?OT Treatment/Interventions:    ?  ?OT Goals(Current goals can be found in the care plan section)    ?OT Frequency:   ?  ? ?Co-evaluation PT/OT/SLP Co-Evaluation/Treatment: Yes ?Reason for Co-Treatment: For patient/therapist safety;To address functional/ADL transfers ?PT goals addressed during session: Mobility/safety with mobility;Balance ?OT goals addressed during session: ADL's and self-care;Strengthening/ROM ?  ? ?  ?AM-PAC OT "6 Clicks" Daily Activity     ?Outcome Measure Help from another person eating meals?: None ?Help from another person taking care of personal grooming?: A Little ?Help from another person toileting, which includes using toliet, bedpan, or urinal?: A Little ?Help from another person bathing (including washing, rinsing, drying)?: A Little ?  Help from another person to put on and taking off regular upper body clothing?: A Little ?Help from another person to put on and taking off regular lower body clothing?: A Lot ?6 Click Score: 18 ?  ?End of Session Equipment Utilized During Treatment: Oxygen ?Nurse Communication: Mobility status ? ?Activity Tolerance: Patient tolerated treatment well ?Patient left: in chair;with call bell/phone within reach ? ?OT Visit Diagnosis: Pain  ?              ?Time: 3419-6222 ?OT Time Calculation (min): 31 min ?Charges:  OT Evaluation ?$OT Eval Moderate Complexity: 1 Mod ? ?Kimila Papaleo,  OTR/L ?[facility]ute [facility] Services  ?Office [phone] ?Pager: [phone]  ? ?Dyane Broberg L Bryant Saye ?11/27/2021, 10:33 AM ?

## 2021-11-27 NOTE — Progress Notes (Signed)
Page in to [facility] per pt  request. Inquiring about a wheelchair to go home with.  ?

## 2021-11-27 NOTE — Plan of Care (Signed)
  Problem: Nutrition: Goal: Adequate nutrition will be maintained Outcome: Progressing   Problem: Coping: Goal: Level of anxiety will decrease Outcome: Progressing   Problem: Elimination: Goal: Will not experience complications related to urinary retention Outcome: Progressing   Problem: Pain Managment: Goal: General experience of comfort will improve Outcome: Progressing   

## 2021-11-27 NOTE — Progress Notes (Signed)
?Progress Note ? ? ?Patient: [name] OQH:[id number] DOB: 1956/07/15 DOA: 11/24/2021     2 ?DOS: the patient was seen and examined on 11/27/2021 ?  ?Brief hospital course: ?66 year old past medical history significant for ventral hernia, left foot ulcer, opioid-induced constipation, chronic opioid medication use, vitamin deficiency, diabetes on insulin, peripheral artery disease, OSA, OHS, anemia, hypothyroidism, hyperlipidemia, hypertension, neuropathy, history of colon cancer 2014, CHF presents after a fall.  Patient fell out of his wheelchair on the sidewalk.  He lost consciousness.  He was evaluated by EMS and was found to have a blood glucose of 33.  Evaluation in the ED blood sugar was low, he was also found to have acute respiratory failure hypercapnic with a PCO2 of 75.  He was placed on BiPAP.  Subsequently pH improved to 7.2 and PCO2 decreased to 64. ? ?He was a started on D10 drip for hypoglycemia. ?He has been off D 10 IV fluids. Remain stable on BIPAP at HS>  ?Blood gas was obtain yesterday after been several day on BIPAP at HS. He will need home oxygen.  ? ? ?Assessment and Plan: ?* Hypoglycemia ?In the setting of poor oral intake, and significant amount of insulin use. ?He used 200 units of Levemir.  ?Synjardy was discontinued with PCP 2 days prior to admission.  Also Mounjaro was discontinue, he didn't tolerate it.  ?CBG stable off D 10 IV fluids.  ?Started on SSI. Plan to evaluate amount of insulin he required to determine dose of insulin at discharge.  ? ? ?Gout attack ?Report swelling index finger right.  ?Uric acid checked, elevated at 11.5.  ?Start Allopurinol and colchicine.  ? ?Syncope ?Related to hypoglycemia and Hypercapnic respiratory failure.  ? ?Stage 3b chronic kidney disease (CKD) ([place]) ?Stable. Cr baseline 2.1--2.2 ? ?Acute respiratory failure with hypoxia and hypercapnia (HCC) ?Patient presented with hypercapnic respiratory failure secondary to OSA, OHS, hypoglycemia ?She had pH 7.1  PCO2 75.  After he was on BiPAP pH improved to 7.2 and PCO2 decreased to 64 ?He will benefit from BiPAP at home. CM assisting with BIPAP ?Alert, tolerating BIPAP at HS>  ? ?Acute metabolic encephalopathy ?Secondary to hypoglycemia and hypercapnic respiratory failure ?Improved  with D10 and BiPAP. ?He is alert and conversant.  ? ? ?Hypocalcemia ?8.1 ? ?Fall (on)(from) sidewalk curb, initial encounter ?X-ray negative for fracture. ?PT eval ? ?Hyperkalemia ?Received kayexalate. Resolved.  ? ?Insulin-requiring or dependent type II diabetes mellitus ([place]) ?Present with severe hypoglycemia, symptomatic. ?Off D 10.  ?Started on SSI.  ? ?Class 3 obesity with alveolar hypoventilation, serious comorbidity, and body mass index (BMI) of 50.0 to 59.9 in adult [facility]) ?He needs lifestyle modification. ? ?Long-term current use of opiate analgesic ?Opioids held due to altered mental status and hypercarbic respiratory failure. ?Plan to resume lower dose norco.  ? ?Iron deficiency anemia ?Hemoglobin stable ? ?Hypothyroidism ?Continue with Synthroid 75 mcg daily. ? ?Chronic diastolic CHF (congestive heart failure) ([place]) ?Continue with Lasix 100 mg p.o. twice daily. ?Monitor Renal function. ?Stable.  ? ?Essential hypertension ?Continue with bisoprolol and Lasix ? ?Hyperlipidemia with target LDL less than 100 ?Continue with Lipitor ? ? ? ? ?  ? ?Subjective: he is feeling better, alert conversant.  ?Report right index finger pain, swelling overnight.  ? ? ?Physical Exam: ?Vitals:  ? 11/27/21 0852 11/27/21 1200 11/27/21 1212 11/27/21 1239  ?BP:    137/76  ?Pulse:    68  ?Resp:    18  ?Temp: 98.3 ?F (36.8 ?C) 98.4 ?  F (36.9 ?C) 98.4 ?F (36.9 ?C) 98.5 ?F (36.9 ?C)  ?TempSrc: Oral Oral Oral   ?SpO2:    100%  ?Weight:      ?Height:      ? ?General; NAD ?Lungs; CTA ?Abdomen; soft, nt ? ?Data Reviewed: ? ?Cbc and bmet, uric acid reviewed.  ? ?Family Communication: care discussed with patient.  ? ?Disposition: ?Status is: Inpatient ?Remains  inpatient appropriate because: admitted with resp failure, hypercapnic.  ? Planned Discharge Destination: Home ? ? ? ?Time spent: 45 minutes ? ?Author: ?Elmarie Shiley, MD ?11/27/2021 12:43 PM ? ?For on call review [email or web address].  ?

## 2021-11-27 NOTE — TOC Progression Note (Addendum)
Transition of Care (TOC) - Progression Note  ? ? ?Patient Details  ?Name: [name] ?MRN: [id number] ?Date of Birth: 1956-04-30 ? ?Transition of Care (TOC) CM/SW Contact  ?Ross Ludwig, LCSW ?Phone Number: ?11/27/2021, 4:31 PM ? ?Clinical Narrative:    ? ?CSW received phone call from bedside nurse, patient was requesting a wheelchair to borrow in order to get back home.  CSW explained to him that the hospital can not loan him a wheelchair to get home, but he would qualify for EMS transport home since he will need oxygen and because of his weight.  Patient agreed to having EMS transport home.  Patient will need oxygen, CSW asked if anyone will be home that the oxygen can be received, he said yes his sister can receive the oxygen once it is delivered.  Patient also requested a heavy duty bedside commode.  CSW contacted [employer] and they will work on getting his oxygen set up and the bedside commode.  [employer] will work on getting it delivered tomorrow before patient leaves.  CSW was also able to get home health setup through [place], for PT, OT, and RN.  CSW spoke to [place], at [facility] agency she can accept patient.  CSW requested [place] orders for patient. ? ?Patient will be going home via EMS once he is medically ready for discharge. ? ? ?Expected Discharge Plan: Home/Self Care ?Barriers to Discharge: No Barriers Identified ? ?Expected Discharge Plan and Services ?Expected Discharge Plan: Home/Self Care ?  ?  ?  ?  ?                ?  ?  ?  ?  ?  ?  ?  ?  ?  ?  ? ? ?Social Determinants of Health (SDOH) Interventions ?  ? ?Readmission Risk Interventions ?No flowsheet data found. ? ?

## 2021-11-27 NOTE — Assessment & Plan Note (Addendum)
Report swelling index finger right.  Uric acid checked, elevated at 11.5.  Started  Allopurinol and colchicine.  Hand pain persist, will give one time dose of prednisone.

## 2021-11-28 LAB — BASIC METABOLIC PANEL
Anion gap: 5 (ref 5–15)
BUN: 47 mg/dL — ABNORMAL HIGH (ref 8–23)
CO2: 29 mmol/L (ref 22–32)
Calcium: 8.2 mg/dL — ABNORMAL LOW (ref 8.9–10.3)
Chloride: 100 mmol/L (ref 98–111)
Creatinine, Ser: 2.42 mg/dL — ABNORMAL HIGH (ref 0.61–1.24)
GFR, Estimated: 29 mL/min — ABNORMAL LOW (ref >60)
Glucose, Bld: 166 mg/dL — ABNORMAL HIGH (ref 70–99)
Potassium: 4.3 mmol/L (ref 3.5–5.1)
Sodium: 134 mmol/L — ABNORMAL LOW (ref 135–145)

## 2021-11-28 LAB — GLUCOSE, CAPILLARY
Glucose-Capillary: 167 mg/dL — ABNORMAL HIGH (ref 70–99)
Glucose-Capillary: 174 mg/dL — ABNORMAL HIGH (ref 70–99)
Glucose-Capillary: 183 mg/dL — ABNORMAL HIGH (ref 70–99)
Glucose-Capillary: 188 mg/dL — ABNORMAL HIGH (ref 70–99)
Glucose-Capillary: 188 mg/dL — ABNORMAL HIGH (ref 70–99)
Glucose-Capillary: 206 mg/dL — ABNORMAL HIGH (ref 70–99)
Glucose-Capillary: 213 mg/dL — ABNORMAL HIGH (ref 70–99)

## 2021-11-28 MED ORDER — FUROSEMIDE 40 MG PO TABS
100.0000 mg | ORAL_TABLET | Freq: Two times a day (BID) | ORAL | Status: DC
  Administered 2021-11-29 – 2021-12-01 (×4): 100 mg via ORAL
  Filled 2021-11-28 (×5): qty 1

## 2021-11-28 MED ORDER — INSULIN DETEMIR 100 UNIT/ML ~~LOC~~ SOLN
10.0000 [IU] | Freq: Every day | SUBCUTANEOUS | Status: DC
  Administered 2021-11-28 – 2021-12-01 (×4): 10 [IU] via SUBCUTANEOUS
  Filled 2021-11-28 (×4): qty 0.1

## 2021-11-28 NOTE — Progress Notes (Signed)
?Progress Note ? ? ?PatientDamarkus [name] [name]:[id number] DOB: 22-Nov-1955 DOA: 11/24/2021     3 ?DOS: the patient was seen and examined on 11/28/2021 ?  ?Brief hospital course: ?66 year old past medical history significant for ventral hernia, left foot ulcer, opioid-induced constipation, chronic opioid medication use, vitamin deficiency, diabetes on insulin, peripheral artery disease, OSA, OHS, anemia, hypothyroidism, hyperlipidemia, hypertension, neuropathy, history of colon cancer 2014, CHF presents after a fall.  Patient fell out of his wheelchair on the sidewalk.  He lost consciousness.  He was evaluated by EMS and was found to have a blood glucose of 33.  Evaluation in the ED blood sugar was low, he was also found to have acute respiratory failure hypercapnic with a PCO2 of 75.  He was placed on BiPAP.  Subsequently pH improved to 7.2 and PCO2 decreased to 64. ? ?He was a started on D10 drip for hypoglycemia. ?He has been off D 10 IV fluids. Remain stable on BIPAP at HS>  ?Blood gas was obtain yesterday after been several day on BIPAP at HS. He will need home oxygen.  ? ? ?Assessment and Plan: ?* Hypoglycemia ?In the setting of poor oral intake, and significant amount of insulin use. ?He used 200 units of Levemir.  ?Synjardy was discontinued with PCP 2 days prior to admission.  Also Mounjaro was discontinue, he didn't tolerate it.  ?CBG stable off D 10 IV fluids.  ?Started on SSI. Plan to start levemir low dose. Monitor CBG>  ? ? ?Gout attack ?Report swelling index finger right.  ?Uric acid checked, elevated at 11.5.  ?Started  Allopurinol and colchicine.  ? ?Syncope ?Related to hypoglycemia and Hypercapnic respiratory failure.  ? ?Stage 3b chronic kidney disease (CKD) ([place]) ?Stable. Cr baseline 2.1--2.2 ? ?Acute respiratory failure with hypoxia and hypercapnia (HCC) ?Patient presented with hypercapnic respiratory failure secondary to OSA, OHS, hypoglycemia ?She had pH 7.1 PCO2 75.  After he was on BiPAP pH  improved to 7.2 and PCO2 decreased to 64. ?He will benefit from BiPAP at home. CM assisting with BIPAP ?Alert, tolerating BIPAP at HS>  ?He will need home oxygen as well.  ? ?Acute metabolic encephalopathy ?Secondary to hypoglycemia and hypercapnic respiratory failure ?Improved  with D10 and BiPAP. ?He is alert and conversant.  ? ? ?Hypocalcemia ?8.1 ? ?Fall (on)(from) sidewalk curb, initial encounter ?X-ray negative for fracture. ?PT eval ? ?Hyperkalemia ?Received kayexalate. Resolved.  ? ?Insulin-requiring or dependent type II diabetes mellitus ([place]) ?Present with severe hypoglycemia, symptomatic. ?Off D 10.  ?Started on SSI. Start levemir low dose 10 units.  ? ?Class 3 obesity with alveolar hypoventilation, serious comorbidity, and body mass index (BMI) of 50.0 to 59.9 in adult [facility]) ?He needs lifestyle modification. ? ?Long-term current use of opiate analgesic ?Opioids held due to altered mental status and hypercarbic respiratory failure. ?On low dose norco.  ? ?Iron deficiency anemia ?Hemoglobin stable ? ?Hypothyroidism ?Continue with Synthroid 75 mcg daily. ? ?Chronic diastolic CHF (congestive heart failure) ([place]) ?Continue with Lasix 100 mg p.o. twice daily. ?Monitor Renal function. ?Increase cr to 2.4 hold lasix tonight.  ? ?Essential hypertension ?Continue with bisoprolol and Lasix ? ?Hyperlipidemia with target LDL less than 100 ?Continue with Lipitor ? ? ? ? ?  ? ?Subjective: he is till having finger pain, and edema hands.  ? ?Physical Exam: ?Vitals:  ? 11/28/21 0052 11/28/21 0330 11/28/21 0500 11/28/21 1254  ?BP: 116/62 115/80  (!) 143/71  ?Pulse: 64 67  65  ?Resp: 20 18  (!)  22  ?Temp: 98.4 ?F (36.9 ?C) 98.1 ?F (36.7 ?C)  98.7 ?F (37.1 ?C)  ?TempSrc:  Oral  Oral  ?SpO2: 98% 98%  100%  ?Weight:   (!) 235.9 kg   ?Height:      ? ?General; alert ?CVS; S 1, S 2 RRR ?Lung; CTA ? ?Data Reviewed: ? ?Cbc and bmet ? ?Family Communication: Care discussed with patient.  ? ?Disposition: ?Status is: Inpatient ?Remains  inpatient appropriate because: admitted with resp failure hypoglycemia, adjusting insulin arranging BIPAP>  ? Planned Discharge Destination: Home ? ? ? ?Time spent: 45 minutes ? ?Author: ?Elmarie Shiley, MD ?11/28/2021 4:06 PM ? ?For on call review [email or web address].  ?

## 2021-11-29 LAB — BASIC METABOLIC PANEL
Anion gap: 7 (ref 5–15)
BUN: 45 mg/dL — ABNORMAL HIGH (ref 8–23)
CO2: 28 mmol/L (ref 22–32)
Calcium: 8.1 mg/dL — ABNORMAL LOW (ref 8.9–10.3)
Chloride: 100 mmol/L (ref 98–111)
Creatinine, Ser: 2.27 mg/dL — ABNORMAL HIGH (ref 0.61–1.24)
GFR, Estimated: 31 mL/min — ABNORMAL LOW (ref >60)
Glucose, Bld: 157 mg/dL — ABNORMAL HIGH (ref 70–99)
Potassium: 4.5 mmol/L (ref 3.5–5.1)
Sodium: 135 mmol/L (ref 135–145)

## 2021-11-29 LAB — GLUCOSE, CAPILLARY
Glucose-Capillary: 149 mg/dL — ABNORMAL HIGH (ref 70–99)
Glucose-Capillary: 161 mg/dL — ABNORMAL HIGH (ref 70–99)
Glucose-Capillary: 163 mg/dL — ABNORMAL HIGH (ref 70–99)
Glucose-Capillary: 174 mg/dL — ABNORMAL HIGH (ref 70–99)
Glucose-Capillary: 216 mg/dL — ABNORMAL HIGH (ref 70–99)
Glucose-Capillary: 315 mg/dL — ABNORMAL HIGH (ref 70–99)
Glucose-Capillary: 351 mg/dL — ABNORMAL HIGH (ref 70–99)

## 2021-11-29 MED ORDER — PREDNISONE 20 MG PO TABS
20.0000 mg | ORAL_TABLET | Freq: Once | ORAL | Status: AC
  Administered 2021-11-29: 20 mg via ORAL
  Filled 2021-11-29: qty 1

## 2021-11-29 NOTE — Care Management (Signed)
?  ?  Durable Medical Equipment  ?(From admission, onward)  ?  ? ? ?  ? ?  Start     Ordered  ? 11/29/21 1153  For home use only DME Hospital bed  Once       ?Question Answer Comment  ?Length of Need 6 Months   ?The above medical condition requires: Patient requires the ability to reposition immediately   ?Head must be elevated greater than: 45 degrees   ?Bed type Semi-electric   ?  ? 11/29/21 1152  ? 11/27/21 1633  For home use only DME Bedside commode  Once       ?Comments: Heavy Duty BSC  ?Question:  Patient needs a bedside commode to treat with the following condition  Answer:  Fear for personal safety  ? 11/27/21 1633  ? 11/27/21 0800  For home use only DME oxygen  Once       ?Question Answer Comment  ?Length of Need 6 Months   ?Mode or (Route) Nasal cannula   ?Liters per Minute 3   ?Frequency Continuous (stationary and portable oxygen unit needed)   ?Oxygen delivery system Gas   ?  ? 11/27/21 0759  ? 11/26/21 1347  For home use only DME Bipap  Once       ?Comments: Auto BIPAP. Imax 25 cm H20, Emin 12 cm H2O, PS range 6-8 cm H2O, with 4 LPM o2 bleed in.  ?Question:  Length of Need  Answer:  Lifetime  ? 11/26/21 1348  ? ?  ?  ? ?  ?  ?

## 2021-11-29 NOTE — Progress Notes (Signed)
Pt placed on Overnight pulse ox.  Pt currently on 4 LPM [place], BIPAP QHS will be held for tonight.  ?

## 2021-11-29 NOTE — Progress Notes (Signed)
Physical Therapy Treatment ?Patient Details ?Name: [name] ?MRN: [id number] ?DOB: September 21, 1955 ?Today's Date: 11/29/2021 ? ? ?History of Present Illness (P) Patient is 66 y.o. male presented to ED via EMS after fall our of wheelchair, he had a LOC and was found ot be hypoglycemic. PMH significant for ventral hernia, left foot ulcer, opioid-induced constipation, chronic opioid medication use, vitamin deficiency, diabetes on insulin, peripheral artery disease, OSA, OHS, anemia, hypothyroidism, hyperlipidemia, hypertension, neuropathy, history of colon cancer 2014, CHF. ? ?  ?PT Comments  ? ? Pt required min-mod assist for bed mobility, min guard for transfers with +2 for safety, and min guard for ambulation EOB +2 for safety, no physical assist required. Educated pt on the importance of performing sit to stand type exercises in order to achieve goals, pt verbalized understanding. Re-educated pt on utilizing ice to help with pain in right index finger. Pt will continue to benefit from skilled therapy to improve functional mobility, we will continue to follow acutely. ?   ?Recommendations for follow up therapy are one component of a multi-disciplinary discharge planning process, led by the attending physician.  Recommendations may be updated based on patient status, additional functional criteria and insurance authorization. ? ?Follow Up Recommendations ? (P) Home health PT ?  ?  ?Assistance Recommended at Discharge (P) Intermittent Supervision/Assistance  ?Patient can return home with the following (P) Help with stairs or ramp for entrance;Assist for transportation;Assistance with cooking/housework ?  ?Equipment Recommendations ? (P) Wheelchair (measurements PT);Wheelchair cushion (measurements PT) (bariatric wheelchair (pt is ~550 lbs))  ?  ?Recommendations for Other Services   ? ? ?  ?Precautions / Restrictions Precautions ?Precautions: (P) Fall ?Restrictions ?Weight Bearing Restrictions: (P) No  ?  ? ?Mobility ?  Bed Mobility ?  ?Bed Mobility: Supine to Sit ?  ?  ?  ?  ?  ?General bed mobility comments: Pt initiated trunk lift and LE advancement off bed, required min/mod assist to bring trunk to fully upright position. Moderate verbal cues for hand placement (bringing hand across to bed rail to assist in raising upright). Pt generally able to initiate movement but requires min/mod assist for steadying. ?  ? ?Transfers ?  ?Equipment used: Rolling walker (2 wheels) (Bariatric RW rated for 500#) ?  ?  ?  ?Step pivot transfers: Min guard, +2 safety/equipment ?  ?  ?  ?General transfer comment: Min guard for safety +2 to stand from EOB and take small steps to pivot to recliner; no physical assist needed just steadying of AD and monitor lines. Pt demonstrated forward flexed trunk and with LLE externally rotated. ?  ? ?Ambulation/Gait ?Ambulation/Gait assistance: Min guard, +2 safety/equipment ?Gait Distance (Feet): 3 Feet ?Assistive device: Rolling walker (2 wheels) (Bariatric walker rated for 500#) ?Gait Pattern/deviations: Step-to pattern, Decreased stride length, Trunk flexed ?Gait velocity: decreased ?  ?  ?General Gait Details: Pt ambulated ~29f at EOB using bariatric walker min guard +2 for safety with recliner follow, no physical assist required. Steadying of AD provided by PT. Pt exhibited SOB and fatigue. ? ? ?Stairs ?  ?  ?  ?  ?  ? ? ?Wheelchair Mobility ?  ? ?Modified Rankin (Stroke Patients Only) ?  ? ? ?  ?Balance   ?  ?  ?  ?  ?Standing balance support: During functional activity, Bilateral upper extremity supported ?Standing balance-Leahy Scale: Poor ?Standing balance comment: Pt reliant on BUE support on RW during functional activity ?  ?  ?  ?  ?  ?  ?  ?  ?  ?  ?  ?  ? ?  ?  Cognition   ?  ?  ?  ?  ?  ?  ?  ?  ?  ?  ?  ?  ?  ?  ?  ?  ?  ?  ?  ?  ?  ? ?  ?Exercises   ? ?  ?General Comments   ?  ?  ? ?Pertinent Vitals/Pain Pain Assessment ?Pain Assessment: Faces ?Faces Pain Scale: Hurts a little bit ?Pain Location:  right index finger ?Pain Descriptors / Indicators: Grimacing, Guarding ?Pain Intervention(s): Other (comment) (Encouraged use of ice)  ? ? ?Home Living   ?  ?  ?  ?  ?  ?  ?  ?  ?  ?   ?  ?Prior Function    ?  ?  ?   ? ?PT Goals (current goals can now be found in the care plan section) Acute Rehab PT Goals ?Patient Stated Goal: (P) get wheelchair for transportation, be able to walk again to transfer more easily ?PT Goal Formulation: (P) With patient ?Time For Goal Achievement: (P) 12/11/21 ?Potential to Achieve Goals: (P) Good ?Progress towards PT goals: (P) Progressing toward goals ? ?  ?Frequency ? ? ? (P) Min 3X/week ? ? ? ?  ?PT Plan (P) Current plan remains appropriate  ? ? ?Co-evaluation   ?  ?  ?  ?  ? ?  ?AM-PAC PT "6 Clicks" Mobility   ?Outcome Measure ? Help needed turning from your back to your side while in a flat bed without using bedrails?: (P) A Little ?Help needed moving from lying on your back to sitting on the side of a flat bed without using bedrails?: (P) A Little ?Help needed moving to and from a bed to a chair (including a wheelchair)?: (P) A Little ?Help needed standing up from a chair using your arms (e.g., wheelchair or bedside chair)?: (P) A Little ?Help needed to walk in hospital room?: (P) Total ?Help needed climbing 3-5 steps with a railing? : (P) Total ?6 Click Score: (P) 14 ? ?  ?End of Session Equipment Utilized During Treatment: (P) Oxygen ?Activity Tolerance: (P) Patient tolerated treatment well ?Patient left: (P) in chair;with call bell/phone within reach ?Nurse Communication: Mobility status ?PT Visit Diagnosis: (P) Unsteadiness on feet (R26.81);Other abnormalities of gait and mobility (R26.89);Difficulty in walking, not elsewhere classified (R26.2) ?  ? ? ?Time: 2505-3976 ?PT Time Calculation (min) (ACUTE ONLY): 24 min ? ?Charges:  $Therapeutic Activity: 23-37 mins          ?          ? ?Coolidge Breeze, PT, DPT ?[facility] ?Office: [phone] ?Pager:  [phone] ? ? ?Coolidge Breeze ?11/29/2021, 1:21 PM ? ?

## 2021-11-29 NOTE — Progress Notes (Signed)
?Progress Note ? ? ?PatientRansome [name] SLH:[id number] DOB: June 12, 1956 DOA: 11/24/2021     4 ?DOS: the patient was seen and examined on 11/29/2021 ?  ?Brief hospital course: ?66 year old past medical history significant for ventral hernia, left foot ulcer, opioid-induced constipation, chronic opioid medication use, vitamin deficiency, diabetes on insulin, peripheral artery disease, OSA, OHS, anemia, hypothyroidism, hyperlipidemia, hypertension, neuropathy, history of colon cancer 2014, CHF presents after a fall.  Patient fell out of his wheelchair on the sidewalk.  He lost consciousness.  He was evaluated by EMS and was found to have a blood glucose of 33.  Evaluation in the ED blood sugar was low, he was also found to have acute respiratory failure hypercapnic with a PCO2 of 75.  He was placed on BiPAP.  Subsequently pH improved to 7.2 and PCO2 decreased to 64. ? ?He was a started on D10 drip for hypoglycemia. ?He has been off D 10 IV fluids. Remain stable on BIPAP at HS>  ?Blood gas was obtain yesterday after been several day on BIPAP at HS. He will need home oxygen.  ? ?Plan to discharge home 3/14. ? ?Assessment and Plan: ?* Hypoglycemia ?In the setting of poor oral intake, and significant amount of insulin use. ?He used 200 units of Levemir.  ?Synjardy was discontinued with PCP 2 days prior to admission.  Also Mounjaro was discontinue, he didn't tolerate it.  ?CBG stable off D 10 IV fluids.  ?Started on SSI. Started levemir low dose. Monitor CBG>  ?CBG stable on 10 units levemir.  ? ? ?Gout attack ?Report swelling index finger right.  ?Uric acid checked, elevated at 11.5.  ?Started  Allopurinol and colchicine.  ?Hand pain persist, will give one time dose of prednisone.  ? ?Syncope ?Related to hypoglycemia and Hypercapnic respiratory failure.  ? ?Stage 3b chronic kidney disease (CKD) ([place]) ?Stable. Cr baseline 2.1--2.2 ? ?Acute respiratory failure with hypoxia and hypercapnia (HCC) ?Patient presented with  hypercapnic respiratory failure secondary to OSA, OHS, hypoglycemia ?He had pH 7.1 PCO2 75.  After he was on BiPAP pH improved to 7.2 and PCO2 decreased to 64. ?He will benefit from BiPAP at home. CM assisting with BIPAP ?Alert, tolerating BIPAP at HS>  ?He will need home oxygen as well.  ?Stable.  ? ?Acute metabolic encephalopathy ?Secondary to hypoglycemia and hypercapnic respiratory failure. ?Improved  with D10 and BiPAP. ?He is alert and conversant.  ? ? ?Hypocalcemia ?8.1 ? ?Fall (on)(from) sidewalk curb, initial encounter ?X-ray negative for fracture. ?PT eval. Home health will be arrange.  ? ?Hyperkalemia ?Received kayexalate. Resolved.  ? ?Insulin-requiring or dependent type II diabetes mellitus ([place]) ?Present with severe hypoglycemia, symptomatic. ?Off D 10.  ?Started on SSI. Started levemir low dose 10 units.  ? ?Class 3 obesity with alveolar hypoventilation, serious comorbidity, and body mass index (BMI) of 50.0 to 59.9 in adult [facility]) ?He needs lifestyle modification. ? ?Long-term current use of opiate analgesic ?Opioids held due to altered mental status and hypercarbic respiratory failure. ?On low dose norco. Dose reduce.  ? ?Iron deficiency anemia ?Hemoglobin stable ? ?Hypothyroidism ?Continue with Synthroid 75 mcg daily. ? ?Chronic diastolic CHF (congestive heart failure) ([place]) ?Continue with Lasix 100 mg p.o. twice daily. ?Monitor Renal function. ?Increase cr to 2.4 hold lasix tonight. Cr down to 2.2. ? ?Essential hypertension ?Continue with bisoprolol and Lasix ? ?Hyperlipidemia with target LDL less than 100 ?Continue with Lipitor ? ? ? ? ?  ? ?Subjective: He is not feeling well today, report persistent right  hand pain swelling. Report pain on his right side from sleeping in bed.  ? ? ?Physical Exam: ?Vitals:  ? 11/28/21 2048 11/29/21 0433 11/29/21 0500 11/29/21 1127  ?BP: (!) 143/72 (!) 114/58  119/69  ?Pulse: 66 66  65  ?Resp: '20 18  16  '$ ?Temp: 98.6 ?F (37 ?C) 98.5 ?F (36.9 ?C)  98.3 ?F (36.8 ?C)   ?TempSrc: Oral     ?SpO2: 98% 92%  97%  ?Weight:   (!) 235.9 kg   ?Height:      ? ?General; NAD ?CVS; S 1, S 2 RRR ?Lungs; CTA ? ?Data Reviewed: ? ?Bmet reviewed.  ? ?Family Communication: Care  discussed with patient.  ? ?Disposition: ?Status is: Inpatient ?Remains inpatient appropriate because: adjusting medications for gout. Needs BIPAP hospital bed. Plan for discharge 3/14 ? ? Planned Discharge Destination: Home ? ? ? ?Time spent: 45 minutes ? ?Author: ?Elmarie Shiley, MD ?11/29/2021 2:49 PM ? ?For on call review [email or web address].  ?

## 2021-11-29 NOTE — TOC Transition Note (Addendum)
Transition of Care (TOC) - CM/SW Discharge Note ? ? ?Patient Details  ?Name: [name] ?MRN: [id number] ?Date of Birth: 06/22/1956 ? ?Transition of Care [facility]) CM/SW Contact:  ?Dessa Phi, RN ?Phone Number: ?11/29/2021, 12:09 PM ? ? ?Clinical Narrative:  spoke to patient about d/c home tomorrow-[facility] aware. [facility] dme rep danielle-hospital bed/02,bsc,bipap(they will check on anticipated delivery for bipap) all other dme delivery tomorrow. Hospital bed to arrive in home prior patient d/c by PTAR.  ?-1p-spoke to [facility] dme rep [facility]-confirmed that process for bipap ongoing-depends on documents per insurance-faxed overnight sleep study to [facility] rep Thedore Mins confirmed confirmation [phone] provided Zach tel# to contact for explanation of process. ?-2:40p overnight oximetry was only done for 30 minutes. Please either order new overnight oximetry or [facility] can do it outpatient with order.MD notified. ? ? ?Final next level of care: [place] ?Barriers to Discharge: No Barriers Identified ? ? ?Patient Goals and CMS Choice ?Patient states their goals for this hospitalization and ongoing recovery are:: home ?CMS Medicare.gov Compare Post Acute Care list provided to:: Patient ?Choice offered to / list presented to : Patient ? ?Discharge Placement ?  ?           ?  ?Patient to be transferred to facility by: PTAR ?Name of family member notified: Diane sister [phone] ?Patient and family notified of of transfer: 11/29/21 ? ?Discharge Plan and Services ?  ?Discharge Planning Services: CM Consult ?Post Acute Care Choice: Durable Medical Equipment, Home Health          ?DME Arranged: Oxygen, Hospital bed, Bedside commode ?DME Agency: [facility] ?Date DME Agency Contacted: 11/29/21 ?Time DME Agency Contacted: 1206 ?Representative spoke with at DME Agency: Andee Poles ?HH Arranged: RN, PT, OT, Nurse's Aide ?[place] Agency: [place] ?Date HH Agency  Contacted: 11/29/21 ?Time [place]: 4128 ?Representative spoke with at [place]: Malachy Mood ? ?Social Determinants of Health (SDOH) Interventions ?  ? ? ?Readmission Risk Interventions ?No flowsheet data found. ? ? ? ? ?

## 2021-11-30 LAB — GLUCOSE, CAPILLARY
Glucose-Capillary: 221 mg/dL — ABNORMAL HIGH (ref 70–99)
Glucose-Capillary: 233 mg/dL — ABNORMAL HIGH (ref 70–99)
Glucose-Capillary: 244 mg/dL — ABNORMAL HIGH (ref 70–99)
Glucose-Capillary: 245 mg/dL — ABNORMAL HIGH (ref 70–99)
Glucose-Capillary: 266 mg/dL — ABNORMAL HIGH (ref 70–99)

## 2021-11-30 MED ORDER — COLCHICINE 0.6 MG PO TABS: 0.3000 mg | ORAL_TABLET | Freq: Every day | ORAL | 0 refills | Status: DC

## 2021-11-30 MED ORDER — ATORVASTATIN CALCIUM 40 MG PO TABS: 40.0000 mg | ORAL_TABLET | Freq: Every day | ORAL | 1 refills | Status: DC

## 2021-11-30 MED ORDER — HYDROCODONE-ACETAMINOPHEN 10-325 MG PO TABS: 0.5000 | ORAL_TABLET | Freq: Four times a day (QID) | ORAL | 0 refills | Status: AC | PRN

## 2021-11-30 MED ORDER — FUROSEMIDE 20 MG PO TABS: 100.0000 mg | ORAL_TABLET | Freq: Two times a day (BID) | ORAL | 1 refills | Status: DC

## 2021-11-30 MED ORDER — LEVEMIR FLEXTOUCH 100 UNIT/ML ~~LOC~~ SOPN: 15.0000 [IU] | PEN_INJECTOR | Freq: Every day | SUBCUTANEOUS | 5 refills | Status: DC

## 2021-11-30 MED ORDER — ALLOPURINOL 100 MG PO TABS: 100.0000 mg | ORAL_TABLET | Freq: Every day | ORAL | 2 refills | Status: AC

## 2021-11-30 NOTE — Progress Notes (Signed)
Pt said will not be able to go home tonight because is dark at there and lots of holes on his hall way. Spoke with PTAR who said pt is 3rd in line at this time. So transport is canceled  tonight till tomorrow.  ?

## 2021-11-30 NOTE — Discharge Summary (Signed)
?Physician Discharge Summary ?  ?Patient: [name] MRN: [id number] DOB: [birth year]  ?Admit date:     11/24/2021  ?Discharge date: 11/30/21  ?Discharge Physician: Jerald Kief A Xavi Tomasik  ? ?PCP: [name], NP  ? ?Recommendations at discharge:  ? ?Needs B-met to follow renal function.  ?Increase levemir as needed, carefull with hypoglycemia. He report only eating once a day at home.  ?[place] agency working on getting BIPAP for home  ?Norco dose reduce to avoid respiratory suppression.  ? ?Discharge Diagnoses: ?Principal Problem: ?  Hypoglycemia ?Active Problems: ?  Hyperlipidemia with target LDL less than 100 ?  OSA (obstructive sleep apnea) ?  Essential hypertension ?  Chronic diastolic CHF (congestive heart failure) ([place]) ?  Hypothyroidism ?  Iron deficiency anemia ?  Thiamine deficiency ?  Long-term current use of opiate analgesic ?  Therapeutic opioid-induced constipation (OIC) ?  Class 3 obesity with alveolar hypoventilation, serious comorbidity, and body mass index (BMI) of 50.0 to 59.9 in adult [facility]) ?  Insulin-requiring or dependent type II diabetes mellitus ([place]) ?  Hyperkalemia ?  Fall (on)(from) sidewalk curb, initial encounter ?  Hypocalcemia ?  Acute metabolic encephalopathy ?  Acute respiratory failure with hypoxia and hypercapnia (HCC) ?  Stage 3b chronic kidney disease (CKD) ([place]) ?  Syncope ?  Gout attack ? ?Resolved Problems: ?  * No resolved hospital problems. * ? ?Hospital Course: ?66 year old past medical history significant for ventral hernia, left foot ulcer, opioid-induced constipation, chronic opioid medication use, vitamin deficiency, diabetes on insulin, peripheral artery disease, OSA, OHS, anemia, hypothyroidism, hyperlipidemia, hypertension, neuropathy, history of colon cancer 2014, CHF presents after a fall.  Patient fell out of his wheelchair on the sidewalk.  He lost consciousness.  He was evaluated by EMS and was found to have a blood glucose of 33.  Evaluation in the ED blood sugar  was low, he was also found to have acute respiratory failure hypercapnic with a PCO2 of 75.  He was placed on BiPAP.  Subsequently pH improved to 7.2 and PCO2 decreased to 64. ? ?He was a started on D10 drip for hypoglycemia. ?He has been off D 10 IV fluids. Remain stable on BIPAP at HS>  ?Blood gas was obtain yesterday after been several day on BIPAP at HS. He will need home oxygen.  ? ?Plan to discharge home 3/14. ? ?Assessment and Plan: ?* Hypoglycemia ?In the setting of poor oral intake, and significant amount of insulin use. ?He used 200 units of Levemir.  ?Synjardy was discontinued with PCP 2 days prior to admission.  Also Mounjaro was discontinue, he didn't tolerate it.  ?CBG stable off D 10 IV fluids.  ?Started on SSI. Started levemir low dose. Monitor CBG>  ?Discharge on significantly lower home dose Levemir.  ? ? ?Gout attack ?Report swelling index finger right.  ?Uric acid checked, elevated at 11.5.  ?Started  Allopurinol and colchicine.  ?received one time dose prednisone.  ?Continue with colchicine.  ? ?Syncope ?Related to hypoglycemia and Hypercapnic respiratory failure.  ? ?Stage 3b chronic kidney disease (CKD) ([place]) ?Stable. Cr baseline 2.1--2.2 ? ?Acute respiratory failure with hypoxia and hypercapnia (HCC) ?Patient presented with hypercapnic respiratory failure secondary to OSA, OHS, hypoglycemia ?He had pH 7.1 PCO2 75.  After he was on BiPAP pH improved to 7.2 and PCO2 decreased to 64. ?He will benefit from BiPAP at home. CM assisting with BIPAP ?Alert, tolerating BIPAP at HS>  ?He will need home oxygen as well.  ?Stable.  ? ?  Acute metabolic encephalopathy ?Secondary to hypoglycemia and hypercapnic respiratory failure. ?Improved  with D10 and BiPAP. ?He is alert and conversant.  ?CM arranging BIPAP>  ? ? ?Hypocalcemia ?8.1 ? ?Fall (on)(from) sidewalk curb, initial encounter ?X-ray negative for fracture. ?PT eval. Home health will be arrange.  ? ?Hyperkalemia ?Received kayexalate. Resolved.   ? ?Insulin-requiring or dependent type II diabetes mellitus ([place]) ?Present with severe hypoglycemia, symptomatic. ?Off D 10.  ?Started on SSI.  ?CBG elevated yesterday likely related to prednisone.  ?He will be discharge on 15 units of Levemir.  ? ?Class 3 obesity with alveolar hypoventilation, serious comorbidity, and body mass index (BMI) of 50.0 to 59.9 in adult [facility]) ?He needs lifestyle modification. ? ?Long-term current use of opiate analgesic ?Opioids held due to altered mental status and hypercarbic respiratory failure. ?On low dose norco. Dose reduce.  ? ?Iron deficiency anemia ?Hemoglobin stable ? ?Hypothyroidism ?Continue with Synthroid 75 mcg daily. ? ?Chronic diastolic CHF (congestive heart failure) ([place]) ?Continue with Lasix 100 mg p.o. twice daily. ?Monitor Renal function. ?Renal function stable continue with lasix  ? ?Essential hypertension ?Continue with bisoprolol and Lasix ? ?Hyperlipidemia with target LDL less than 100 ?Continue with Lipitor ? ? ? ? ?  ? ? ?Consultants: None ?Procedures performed: None ?Disposition: Home ?Diet recommendation:  ?Discharge Diet Orders (From admission, onward)  ? ?  Start     Ordered  ? 11/30/21 0000  Diet - low sodium heart healthy       ? 11/30/21 1035  ? ?  ?  ? ?  ? ?Carb modified diet ?DISCHARGE MEDICATION: ?Allergies as of 11/30/2021   ? ?   Reactions  ? Shellfish Allergy Anaphylaxis  ? Lisinopril Cough  ? Relistor [methylnaltrexone Bromide] Other (See Comments)  ? confusion  ? Lovastatin Itching  ? ?  ? ?  ?Medication List  ?  ? ?STOP taking these medications   ? ?cyanocobalamin 2000 MCG tablet ?  ?losartan 25 MG tablet ?Commonly known as: COZAAR ?  ?potassium chloride SA 20 MEQ tablet ?Commonly known as: KLOR-CON M ?  ?Relistor 150 MG Tabs ?Generic drug: Methylnaltrexone Bromide ?  ?Synjardy 12.01-999 MG Tabs ?Generic drug: Empagliflozin-metFORMIN HCl ?  ?thiamine 100 MG tablet ?Commonly known as: Vitamin B-1 ?  ?tirzepatide 2.5 MG/0.5ML Pen ?Commonly known  as: MOUNJARO ?  ? ?  ? ?TAKE these medications   ? ?Accu-Chek Aviva Plus test strip ?Generic drug: glucose blood ?Use to test blood sugar twice daily. DX E11.8 ?  ?Accu-Chek Guide w/Device Kit ?Inject 1 Act into the skin 4 (four) times daily. ?  ?Accu-Chek [employer] w/Device Kit ?1 Act by Does not apply route 3 (three) times daily as needed. ?  ?Accu-Chek Softclix Lancets lancets ?CHECK BLOOD SUGAR 2 TIMES  DAILY ?  ?allopurinol 100 MG tablet ?Commonly known as: ZYLOPRIM ?Take 1 tablet (100 mg total) by mouth daily. ?  ?aspirin EC 81 MG tablet ?Take 1 tablet (81 mg total) by mouth daily. ?  ?atorvastatin 40 MG tablet ?Commonly known as: LIPITOR ?Take 1 tablet (40 mg total) by mouth daily. ?  ?bisoprolol 10 MG tablet ?Commonly known as: ZEBETA ?Take 1 tablet (10 mg total) by mouth every evening. ?  ?blood glucose meter kit and supplies ?Dispense based on patient and insurance preference. Use up to four times daily as directed. (FOR ICD-10 E10.9, E11.9). ?  ?blood glucose meter kit and supplies Kit ?Inject 1 each into the skin 4 (four) times daily. Use to check  blood sugar three times daily. Dispense Freestyle Libre according to insurance preference. DX: E11.8 ?  ?Cholecalciferol 50 MCG (2000 UT) Tabs ?Take 1 tablet (2,000 Units total) by mouth daily. ?  ?colchicine 0.6 MG tablet ?Take 0.5 tablets (0.3 mg total) by mouth daily. ?  ?ferrous sulfate 325 (65 FE) MG tablet ?Take 1 tablet (325 mg total) by mouth 2 (two) times daily with a meal. ?  ?FreeStyle [place] 2 Reader Kerrin Mo ?1 Act by Does not apply route daily. ?  ?FreeStyle Libre 2 Sensor Misc ?1 Act by Does not apply route daily. ?  ?furosemide 20 MG tablet ?Commonly known as: LASIX ?Take 5 tablets (100 mg total) by mouth 2 (two) times daily. ?What changed: Another medication with the same name was removed. Continue taking this medication, and follow the directions you see here. ?  ?Gvoke HypoPen 2-Pack 1 MG/0.2ML Soaj ?Generic drug: Glucagon ?Inject 1 Act into  the skin daily as needed. ?What changed:  ?how much to take ?reasons to take this ?  ?HYDROcodone-acetaminophen 10-325 MG tablet ?Commonly known as: NORCO ?Take 0.5 tablets by mouth every 6 (six) hours as need

## 2021-11-30 NOTE — TOC Transition Note (Incomplete Revision)
Transition of Care (TOC) - CM/SW Discharge Note ? ? ?Patient Details  ?Name: [name] ?MRN: [id number] ?Date of Birth: 04-20-1956 ? ?Transition of Care [facility]) CM/SW Contact:  ?Dessa Phi, RN ?Phone Number: ?11/30/2021, 12:41 PM ? ? ?Clinical Narrative: Spoke to patient/sister Diane-d/c plan for home by PTAR-[facility] to deliver hospital bed,02,bsc to home prior patient arriving-confirmed address.dme in rm to be picked up by [facility].HHC all set up. Home Bipap to be f/u by [facility] in otpt setting.No further CM needs.   ?-2:38p-DME to arrive in home after 4p but prior 7p.Family to call floor once dme arrived-Nsg to call PTAR once ready. No further CM needs. ?-4:13p-Received call from patient asking to leave with understanding that hospital bed is not there yet.MD/Nsg notified.PTAR called. ? ? ? ?Final next level of care: [place] ?Barriers to Discharge: No Barriers Identified ? ? ?Patient Goals and CMS Choice ?Patient states their goals for this hospitalization and ongoing recovery are:: home ?CMS Medicare.gov Compare Post Acute Care list provided to:: Patient ?Choice offered to / list presented to : Patient ? ?Discharge Placement ?  ?           ?  ?Patient to be transferred to facility by: PTAR ?Name of family member notified: Diane sister [phone] ?Patient and family notified of of transfer: 11/30/21 ? ?Discharge Plan and Services ?  ?Discharge Planning Services: CM Consult ?Post Acute Care Choice: Durable Medical Equipment, Home Health          ?DME Arranged: Oxygen, Hospital bed, Bedside commode ?DME Agency: [facility] ?Date DME Agency Contacted: 11/29/21 ?Time DME Agency Contacted: 1206 ?Representative spoke with at DME Agency: Andee Poles ?HH Arranged: RN, PT, OT, Nurse's Aide ?[place] Agency: [place] ?Date HH Agency Contacted: 11/29/21 ?Time [place]: 1275 ?Representative spoke with at [place]: Malachy Mood ? ?Social Determinants of Health (SDOH)  Interventions ?  ? ? ?Readmission Risk Interventions ?No flowsheet data found. ? ? ? ? ?

## 2021-11-30 NOTE — Consult Note (Addendum)
? ?  [facility] CM Inpatient Consult ? ? ?11/30/2021 ? ?[name] ?Sep 22, 1955 ?[id number] ? ?Addendum -  12/01/21 1130 am Updated information that the patient no longer has a [facility] provider that is no longer eligible for [employer] Embedded services.  New provider listed as [name], NP, [facility] ? ?Will sign off ? ? ?Follow up:  Embedded with [place] with CCM pharmacist ? ?Coverage for [place] ? ?Call to patient's phone in hospital room HIPAA verified by 2 identifiers.  Explained to patient that he has a provider that has chronic care management and noted to be active with a CCM pharmacist.  Patient states, "I do need to connect with the pharmacist there to make sure I am taking these medications correctly."  Patient wanted to see if a nurse was coming to his home.  Chart review of inpatient [facility] RNCM notes a home health agency has been connected with. Patient states he uses [facility] for transportation and denies food resource needs.  States, "I just need to do better about eating."  ?Patient is also active with [facility] paramedicine program noted. ? ?Plan: Will reach out to Embedded Pharmacist to make aware of patient's transitioning home.  ? ?For questions, please contact: ? ?Natividad Brood, RN BSN CCM ?[facility] Liaison ? [phone] business mobile phone ?Toll free office [phone]  ?Fax number: [phone] ?[email or web address] ? ? ?

## 2021-11-30 NOTE — Progress Notes (Signed)
Patient states he is going home tonight and does not want to wear the BIPAP at this time. RT offered to place patient on BIPAP until he leaves but patient refused. Patient appears stable and fully dressed to go home tonight. RT encouraged the patient to call if he changes his mind and wants to wear the BIPAP.  ?

## 2021-11-30 NOTE — Care Management Important Message (Signed)
Important Message ? ?Patient Details IM Letter given to the Patient. ?Name: [name] ?MRN: [id number] ?Date of Birth: 1955-10-21 ? ? ?Medicare Important Message Given:  Yes ? ? ? ? ?Kerin Salen ?11/30/2021, 12:57 PM ?

## 2021-11-30 NOTE — TOC Transition Note (Signed)
Transition of Care (TOC) - CM/SW Discharge Note ? ? ?Patient Details  ?Name: [name] ?MRN: [id number] ?Date of Birth: 26-Sep-1955 ? ?Transition of Care [facility]) CM/SW Contact:  ?Dessa Phi, RN ?Phone Number: ?11/30/2021, 4:25 PM ? ? ?Clinical Narrative: All DME is in the home. [facility] has been called. No further CM needs.   ? ? ? ?Final next level of care: [place] ?Barriers to Discharge: No Barriers Identified ? ? ?Patient Goals and CMS Choice ?Patient states their goals for this hospitalization and ongoing recovery are:: home ?CMS Medicare.gov Compare Post Acute Care list provided to:: Patient ?Choice offered to / list presented to : Patient ? ?Discharge Placement ?  ?           ?  ?Patient to be transferred to facility by: [facility] ?Name of family member notified: Diane sister [phone] ?Patient and family notified of of transfer: 11/30/21 ? ?Discharge Plan and Services ?  ?Discharge Planning Services: CM Consult ?Post Acute Care Choice: Durable Medical Equipment, Home Health          ?DME Arranged: Oxygen, Hospital bed, Bedside commode ?DME Agency: [facility] ?Date DME Agency Contacted: 11/29/21 ?Time DME Agency Contacted: 1206 ?Representative spoke with at DME Agency: Andee Poles ?HH Arranged: RN, PT, OT, Nurse's Aide ?[place] Agency: [place] ?Date HH Agency Contacted: 11/29/21 ?Time [place]: 4656 ?Representative spoke with at [place]: Malachy Mood ? ?Social Determinants of Health (SDOH) Interventions ?  ? ? ?Readmission Risk Interventions ?No flowsheet data found. ? ? ? ? ?

## 2021-11-30 NOTE — Progress Notes (Signed)
Over night Pulse ox completed , results in Pt chart. ?

## 2021-11-30 NOTE — TOC Transition Note (Addendum)
Transition of Care (TOC) - CM/SW Discharge Note ? ? ?Patient Details  ?Name: [name] ?MRN: [id number] ?Date of Birth: Oct 07, 1955 ? ?Transition of Care [facility]) CM/SW Contact:  ?Dessa Phi, RN ?Phone Number: ?11/30/2021, 12:41 PM ? ? ?Clinical Narrative: Spoke to patient/sister Diane-d/c plan for home by [facility] to deliver hospital bed,02,bsc to home prior patient arriving-confirmed address.dme in rm to be picked up by [facility].HHC all set up. Home Bipap to be f/u by [employer] in otpt setting.No further CM needs.   ?-2:38p-DME to arrive in home after 4p but prior 7p.Family to call floor once dme arrived-Nsg to call [facility] once ready. No further CM needs. ? ? ? ?Final next level of care: [employer] ?Barriers to Discharge: No Barriers Identified ? ? ?Patient Goals and CMS Choice ?Patient states their goals for this hospitalization and ongoing recovery are:: home ?CMS Medicare.gov Compare Post Acute Care list provided to:: Patient ?Choice offered to / list presented to : Patient ? ?Discharge Placement ?  ?           ?  ?Patient to be transferred to facility by: [facility] ?Name of family member notified: Diane sister [phone] ?Patient and family notified of of transfer: 11/30/21 ? ?Discharge Plan and Services ?  ?Discharge Planning Services: CM Consult ?Post Acute Care Choice: Durable Medical Equipment, Home Health          ?DME Arranged: Oxygen, Hospital bed, Bedside commode ?DME Agency: [employer] ?Date DME Agency Contacted: 11/29/21 ?Time DME Agency Contacted: 1206 ?Representative spoke with at DME Agency: Andee Poles ?HH Arranged: RN, PT, OT, Nurse's Aide ?[place] Agency: [place] ?Date HH Agency Contacted: 11/29/21 ?Time [place]: 1829 ?Representative spoke with at [place]: Malachy Mood ? ?Social Determinants of Health (SDOH) Interventions ?  ? ? ?Readmission Risk Interventions ?No flowsheet data found. ? ? ? ? ?

## 2021-12-01 LAB — GLUCOSE, CAPILLARY
Glucose-Capillary: 143 mg/dL — ABNORMAL HIGH (ref 70–99)
Glucose-Capillary: 148 mg/dL — ABNORMAL HIGH (ref 70–99)
Glucose-Capillary: 168 mg/dL — ABNORMAL HIGH (ref 70–99)

## 2021-12-01 NOTE — Care Management (Signed)
Received several messages by secy that patient has wrong hospital bed from [facility] in his home-informed Secy to explain that [facility] will contact him directly to assist w/his concerns-rep Zach aware & will contact patient directly, if patient calls hospital back provide [facility] tel#[phone] to patient. ?

## 2021-12-01 NOTE — Progress Notes (Signed)
Patient given discharge, follow up, and medication instructions, verbalized understanding, IV removed, no telemetry monitor on patient, personal belongings with patient, [facility] to transport home  ?

## 2021-12-01 NOTE — Plan of Care (Signed)
?  Problem: Clinical Measurements: Goal: Ability to maintain clinical measurements within normal limits will improve Outcome: Adequate for Discharge Goal: Will remain free from infection Outcome: Adequate for Discharge Goal: Diagnostic test results will improve Outcome: Adequate for Discharge Goal: Respiratory complications will improve Outcome: Adequate for Discharge Goal: Cardiovascular complication will be avoided Outcome: Adequate for Discharge   Problem: Activity: Goal: Risk for activity intolerance will decrease Outcome: Adequate for Discharge   Problem: Nutrition: Goal: Adequate nutrition will be maintained Outcome: Adequate for Discharge   Problem: Coping: Goal: Level of anxiety will decrease Outcome: Adequate for Discharge   Problem: Elimination: Goal: Will not experience complications related to bowel motility Outcome: Adequate for Discharge Goal: Will not experience complications related to urinary retention Outcome: Adequate for Discharge   Problem: Pain Managment: Goal: General experience of comfort will improve Outcome: Adequate for Discharge   Problem: Safety: Goal: Ability to remain free from injury will improve Outcome: Adequate for Discharge   Problem: Skin Integrity: Goal: Risk for impaired skin integrity will decrease Outcome: Adequate for Discharge   Problem: Education: Goal: Ability to describe self-care measures that may prevent or decrease complications (Diabetes Survival Skills Education) will improve Outcome: Adequate for Discharge Goal: Individualized Educational Video(s) Outcome: Adequate for Discharge   Problem: Coping: Goal: Ability to adjust to condition or change in health will improve Outcome: Adequate for Discharge   Problem: Fluid Volume: Goal: Ability to maintain a balanced intake and output will improve Outcome: Adequate for Discharge   Problem: Health Behavior/Discharge Planning: Goal: Ability to identify and utilize  available resources and services will improve Outcome: Adequate for Discharge Goal: Ability to manage health-related needs will improve Outcome: Adequate for Discharge   Problem: Metabolic: Goal: Ability to maintain appropriate glucose levels will improve Outcome: Adequate for Discharge   Problem: Nutritional: Goal: Maintenance of adequate nutrition will improve Outcome: Adequate for Discharge Goal: Progress toward achieving an optimal weight will improve Outcome: Adequate for Discharge   Problem: Skin Integrity: Goal: Risk for impaired skin integrity will decrease Outcome: Adequate for Discharge   Problem: Tissue Perfusion: Goal: Adequacy of tissue perfusion will improve Outcome: Adequate for Discharge   

## 2021-12-04 DIAGNOSIS — G4733 Obstructive sleep apnea (adult) (pediatric): Secondary | ICD-10-CM | POA: Diagnosis not present

## 2021-12-04 DIAGNOSIS — N1832 Chronic kidney disease, stage 3b: Secondary | ICD-10-CM | POA: Diagnosis not present

## 2021-12-04 DIAGNOSIS — I5032 Chronic diastolic (congestive) heart failure: Secondary | ICD-10-CM | POA: Diagnosis not present

## 2021-12-04 DIAGNOSIS — E875 Hyperkalemia: Secondary | ICD-10-CM | POA: Diagnosis not present

## 2021-12-04 DIAGNOSIS — E569 Vitamin deficiency, unspecified: Secondary | ICD-10-CM | POA: Diagnosis not present

## 2021-12-04 DIAGNOSIS — J9621 Acute and chronic respiratory failure with hypoxia: Secondary | ICD-10-CM | POA: Diagnosis not present

## 2021-12-04 DIAGNOSIS — I130 Hypertensive heart and chronic kidney disease with heart failure and stage 1 through stage 4 chronic kidney disease, or unspecified chronic kidney disease: Secondary | ICD-10-CM | POA: Diagnosis not present

## 2021-12-04 DIAGNOSIS — E519 Thiamine deficiency, unspecified: Secondary | ICD-10-CM | POA: Diagnosis not present

## 2021-12-04 DIAGNOSIS — J9622 Acute and chronic respiratory failure with hypercapnia: Secondary | ICD-10-CM | POA: Diagnosis not present

## 2021-12-04 DIAGNOSIS — K5903 Drug induced constipation: Secondary | ICD-10-CM | POA: Diagnosis not present

## 2021-12-04 DIAGNOSIS — E1122 Type 2 diabetes mellitus with diabetic chronic kidney disease: Secondary | ICD-10-CM | POA: Diagnosis not present

## 2021-12-04 DIAGNOSIS — R55 Syncope and collapse: Secondary | ICD-10-CM | POA: Diagnosis not present

## 2021-12-04 DIAGNOSIS — E11649 Type 2 diabetes mellitus with hypoglycemia without coma: Secondary | ICD-10-CM | POA: Diagnosis not present

## 2021-12-04 DIAGNOSIS — D509 Iron deficiency anemia, unspecified: Secondary | ICD-10-CM | POA: Diagnosis not present

## 2021-12-04 DIAGNOSIS — M10041 Idiopathic gout, right hand: Secondary | ICD-10-CM | POA: Diagnosis not present

## 2021-12-04 DIAGNOSIS — G9341 Metabolic encephalopathy: Secondary | ICD-10-CM | POA: Diagnosis not present

## 2021-12-04 DIAGNOSIS — E1151 Type 2 diabetes mellitus with diabetic peripheral angiopathy without gangrene: Secondary | ICD-10-CM | POA: Diagnosis not present

## 2021-12-04 DIAGNOSIS — E785 Hyperlipidemia, unspecified: Secondary | ICD-10-CM | POA: Diagnosis not present

## 2021-12-04 DIAGNOSIS — E1140 Type 2 diabetes mellitus with diabetic neuropathy, unspecified: Secondary | ICD-10-CM | POA: Diagnosis not present

## 2021-12-04 DIAGNOSIS — E039 Hypothyroidism, unspecified: Secondary | ICD-10-CM | POA: Diagnosis not present

## 2021-12-04 DIAGNOSIS — K439 Ventral hernia without obstruction or gangrene: Secondary | ICD-10-CM | POA: Diagnosis not present

## 2021-12-09 DIAGNOSIS — E875 Hyperkalemia: Secondary | ICD-10-CM | POA: Diagnosis not present

## 2021-12-09 DIAGNOSIS — E11649 Type 2 diabetes mellitus with hypoglycemia without coma: Secondary | ICD-10-CM | POA: Diagnosis not present

## 2021-12-09 DIAGNOSIS — R55 Syncope and collapse: Secondary | ICD-10-CM | POA: Diagnosis not present

## 2021-12-09 DIAGNOSIS — E1151 Type 2 diabetes mellitus with diabetic peripheral angiopathy without gangrene: Secondary | ICD-10-CM | POA: Diagnosis not present

## 2021-12-09 DIAGNOSIS — K5903 Drug induced constipation: Secondary | ICD-10-CM | POA: Diagnosis not present

## 2021-12-09 DIAGNOSIS — G4733 Obstructive sleep apnea (adult) (pediatric): Secondary | ICD-10-CM | POA: Diagnosis not present

## 2021-12-09 DIAGNOSIS — D509 Iron deficiency anemia, unspecified: Secondary | ICD-10-CM | POA: Diagnosis not present

## 2021-12-09 DIAGNOSIS — K439 Ventral hernia without obstruction or gangrene: Secondary | ICD-10-CM | POA: Diagnosis not present

## 2021-12-09 DIAGNOSIS — G9341 Metabolic encephalopathy: Secondary | ICD-10-CM | POA: Diagnosis not present

## 2021-12-09 DIAGNOSIS — E039 Hypothyroidism, unspecified: Secondary | ICD-10-CM | POA: Diagnosis not present

## 2021-12-09 DIAGNOSIS — I130 Hypertensive heart and chronic kidney disease with heart failure and stage 1 through stage 4 chronic kidney disease, or unspecified chronic kidney disease: Secondary | ICD-10-CM | POA: Diagnosis not present

## 2021-12-09 DIAGNOSIS — E519 Thiamine deficiency, unspecified: Secondary | ICD-10-CM | POA: Diagnosis not present

## 2021-12-09 DIAGNOSIS — E1122 Type 2 diabetes mellitus with diabetic chronic kidney disease: Secondary | ICD-10-CM | POA: Diagnosis not present

## 2021-12-09 DIAGNOSIS — E785 Hyperlipidemia, unspecified: Secondary | ICD-10-CM | POA: Diagnosis not present

## 2021-12-09 DIAGNOSIS — N1832 Chronic kidney disease, stage 3b: Secondary | ICD-10-CM | POA: Diagnosis not present

## 2021-12-09 DIAGNOSIS — E569 Vitamin deficiency, unspecified: Secondary | ICD-10-CM | POA: Diagnosis not present

## 2021-12-09 DIAGNOSIS — E1140 Type 2 diabetes mellitus with diabetic neuropathy, unspecified: Secondary | ICD-10-CM | POA: Diagnosis not present

## 2021-12-09 DIAGNOSIS — I5032 Chronic diastolic (congestive) heart failure: Secondary | ICD-10-CM | POA: Diagnosis not present

## 2021-12-09 DIAGNOSIS — M10041 Idiopathic gout, right hand: Secondary | ICD-10-CM | POA: Diagnosis not present

## 2021-12-09 DIAGNOSIS — J9621 Acute and chronic respiratory failure with hypoxia: Secondary | ICD-10-CM | POA: Diagnosis not present

## 2021-12-09 DIAGNOSIS — J9622 Acute and chronic respiratory failure with hypercapnia: Secondary | ICD-10-CM | POA: Diagnosis not present

## 2021-12-10 DIAGNOSIS — E785 Hyperlipidemia, unspecified: Secondary | ICD-10-CM | POA: Diagnosis not present

## 2021-12-10 DIAGNOSIS — D509 Iron deficiency anemia, unspecified: Secondary | ICD-10-CM | POA: Diagnosis not present

## 2021-12-10 DIAGNOSIS — I130 Hypertensive heart and chronic kidney disease with heart failure and stage 1 through stage 4 chronic kidney disease, or unspecified chronic kidney disease: Secondary | ICD-10-CM | POA: Diagnosis not present

## 2021-12-10 DIAGNOSIS — K5903 Drug induced constipation: Secondary | ICD-10-CM | POA: Diagnosis not present

## 2021-12-10 DIAGNOSIS — J9622 Acute and chronic respiratory failure with hypercapnia: Secondary | ICD-10-CM | POA: Diagnosis not present

## 2021-12-10 DIAGNOSIS — G9341 Metabolic encephalopathy: Secondary | ICD-10-CM | POA: Diagnosis not present

## 2021-12-10 DIAGNOSIS — I5032 Chronic diastolic (congestive) heart failure: Secondary | ICD-10-CM | POA: Diagnosis not present

## 2021-12-10 DIAGNOSIS — E875 Hyperkalemia: Secondary | ICD-10-CM | POA: Diagnosis not present

## 2021-12-10 DIAGNOSIS — E039 Hypothyroidism, unspecified: Secondary | ICD-10-CM | POA: Diagnosis not present

## 2021-12-10 DIAGNOSIS — K439 Ventral hernia without obstruction or gangrene: Secondary | ICD-10-CM | POA: Diagnosis not present

## 2021-12-10 DIAGNOSIS — E519 Thiamine deficiency, unspecified: Secondary | ICD-10-CM | POA: Diagnosis not present

## 2021-12-10 DIAGNOSIS — R55 Syncope and collapse: Secondary | ICD-10-CM | POA: Diagnosis not present

## 2021-12-10 DIAGNOSIS — E1122 Type 2 diabetes mellitus with diabetic chronic kidney disease: Secondary | ICD-10-CM | POA: Diagnosis not present

## 2021-12-10 DIAGNOSIS — E1140 Type 2 diabetes mellitus with diabetic neuropathy, unspecified: Secondary | ICD-10-CM | POA: Diagnosis not present

## 2021-12-10 DIAGNOSIS — E1151 Type 2 diabetes mellitus with diabetic peripheral angiopathy without gangrene: Secondary | ICD-10-CM | POA: Diagnosis not present

## 2021-12-10 DIAGNOSIS — E11649 Type 2 diabetes mellitus with hypoglycemia without coma: Secondary | ICD-10-CM | POA: Diagnosis not present

## 2021-12-10 DIAGNOSIS — E569 Vitamin deficiency, unspecified: Secondary | ICD-10-CM | POA: Diagnosis not present

## 2021-12-10 DIAGNOSIS — N1832 Chronic kidney disease, stage 3b: Secondary | ICD-10-CM | POA: Diagnosis not present

## 2021-12-10 DIAGNOSIS — J9621 Acute and chronic respiratory failure with hypoxia: Secondary | ICD-10-CM | POA: Diagnosis not present

## 2021-12-10 DIAGNOSIS — M10041 Idiopathic gout, right hand: Secondary | ICD-10-CM | POA: Diagnosis not present

## 2021-12-10 DIAGNOSIS — G4733 Obstructive sleep apnea (adult) (pediatric): Secondary | ICD-10-CM | POA: Diagnosis not present

## 2021-12-11 DIAGNOSIS — E875 Hyperkalemia: Secondary | ICD-10-CM | POA: Diagnosis not present

## 2021-12-11 DIAGNOSIS — K5903 Drug induced constipation: Secondary | ICD-10-CM | POA: Diagnosis not present

## 2021-12-11 DIAGNOSIS — E1140 Type 2 diabetes mellitus with diabetic neuropathy, unspecified: Secondary | ICD-10-CM | POA: Diagnosis not present

## 2021-12-11 DIAGNOSIS — E785 Hyperlipidemia, unspecified: Secondary | ICD-10-CM | POA: Diagnosis not present

## 2021-12-11 DIAGNOSIS — M10041 Idiopathic gout, right hand: Secondary | ICD-10-CM | POA: Diagnosis not present

## 2021-12-11 DIAGNOSIS — I130 Hypertensive heart and chronic kidney disease with heart failure and stage 1 through stage 4 chronic kidney disease, or unspecified chronic kidney disease: Secondary | ICD-10-CM | POA: Diagnosis not present

## 2021-12-11 DIAGNOSIS — N1832 Chronic kidney disease, stage 3b: Secondary | ICD-10-CM | POA: Diagnosis not present

## 2021-12-11 DIAGNOSIS — E519 Thiamine deficiency, unspecified: Secondary | ICD-10-CM | POA: Diagnosis not present

## 2021-12-11 DIAGNOSIS — E1122 Type 2 diabetes mellitus with diabetic chronic kidney disease: Secondary | ICD-10-CM | POA: Diagnosis not present

## 2021-12-11 DIAGNOSIS — G9341 Metabolic encephalopathy: Secondary | ICD-10-CM | POA: Diagnosis not present

## 2021-12-11 DIAGNOSIS — E569 Vitamin deficiency, unspecified: Secondary | ICD-10-CM | POA: Diagnosis not present

## 2021-12-11 DIAGNOSIS — R55 Syncope and collapse: Secondary | ICD-10-CM | POA: Diagnosis not present

## 2021-12-11 DIAGNOSIS — G4733 Obstructive sleep apnea (adult) (pediatric): Secondary | ICD-10-CM | POA: Diagnosis not present

## 2021-12-11 DIAGNOSIS — J9622 Acute and chronic respiratory failure with hypercapnia: Secondary | ICD-10-CM | POA: Diagnosis not present

## 2021-12-11 DIAGNOSIS — I5032 Chronic diastolic (congestive) heart failure: Secondary | ICD-10-CM | POA: Diagnosis not present

## 2021-12-11 DIAGNOSIS — J9621 Acute and chronic respiratory failure with hypoxia: Secondary | ICD-10-CM | POA: Diagnosis not present

## 2021-12-11 DIAGNOSIS — K439 Ventral hernia without obstruction or gangrene: Secondary | ICD-10-CM | POA: Diagnosis not present

## 2021-12-11 DIAGNOSIS — E1151 Type 2 diabetes mellitus with diabetic peripheral angiopathy without gangrene: Secondary | ICD-10-CM | POA: Diagnosis not present

## 2021-12-11 DIAGNOSIS — E039 Hypothyroidism, unspecified: Secondary | ICD-10-CM | POA: Diagnosis not present

## 2021-12-11 DIAGNOSIS — E11649 Type 2 diabetes mellitus with hypoglycemia without coma: Secondary | ICD-10-CM | POA: Diagnosis not present

## 2021-12-11 DIAGNOSIS — D509 Iron deficiency anemia, unspecified: Secondary | ICD-10-CM | POA: Diagnosis not present

## 2021-12-14 DIAGNOSIS — J9690 Respiratory failure, unspecified, unspecified whether with hypoxia or hypercapnia: Secondary | ICD-10-CM | POA: Diagnosis not present

## 2021-12-14 DIAGNOSIS — E569 Vitamin deficiency, unspecified: Secondary | ICD-10-CM | POA: Diagnosis not present

## 2021-12-14 DIAGNOSIS — E1140 Type 2 diabetes mellitus with diabetic neuropathy, unspecified: Secondary | ICD-10-CM | POA: Diagnosis not present

## 2021-12-14 DIAGNOSIS — E785 Hyperlipidemia, unspecified: Secondary | ICD-10-CM | POA: Diagnosis not present

## 2021-12-14 DIAGNOSIS — M10041 Idiopathic gout, right hand: Secondary | ICD-10-CM | POA: Diagnosis not present

## 2021-12-14 DIAGNOSIS — E875 Hyperkalemia: Secondary | ICD-10-CM | POA: Diagnosis not present

## 2021-12-14 DIAGNOSIS — R55 Syncope and collapse: Secondary | ICD-10-CM | POA: Diagnosis not present

## 2021-12-14 DIAGNOSIS — Z7189 Other specified counseling: Secondary | ICD-10-CM | POA: Diagnosis not present

## 2021-12-14 DIAGNOSIS — K5903 Drug induced constipation: Secondary | ICD-10-CM | POA: Diagnosis not present

## 2021-12-14 DIAGNOSIS — J9622 Acute and chronic respiratory failure with hypercapnia: Secondary | ICD-10-CM | POA: Diagnosis not present

## 2021-12-14 DIAGNOSIS — E11649 Type 2 diabetes mellitus with hypoglycemia without coma: Secondary | ICD-10-CM | POA: Diagnosis not present

## 2021-12-14 DIAGNOSIS — J9621 Acute and chronic respiratory failure with hypoxia: Secondary | ICD-10-CM | POA: Diagnosis not present

## 2021-12-14 DIAGNOSIS — I5032 Chronic diastolic (congestive) heart failure: Secondary | ICD-10-CM | POA: Diagnosis not present

## 2021-12-14 DIAGNOSIS — E0840 Diabetes mellitus due to underlying condition with diabetic neuropathy, unspecified: Secondary | ICD-10-CM | POA: Diagnosis not present

## 2021-12-14 DIAGNOSIS — D509 Iron deficiency anemia, unspecified: Secondary | ICD-10-CM | POA: Diagnosis not present

## 2021-12-14 DIAGNOSIS — Z0000 Encounter for general adult medical examination without abnormal findings: Secondary | ICD-10-CM | POA: Diagnosis not present

## 2021-12-14 DIAGNOSIS — N1832 Chronic kidney disease, stage 3b: Secondary | ICD-10-CM | POA: Diagnosis not present

## 2021-12-14 DIAGNOSIS — K439 Ventral hernia without obstruction or gangrene: Secondary | ICD-10-CM | POA: Diagnosis not present

## 2021-12-14 DIAGNOSIS — E1122 Type 2 diabetes mellitus with diabetic chronic kidney disease: Secondary | ICD-10-CM | POA: Diagnosis not present

## 2021-12-14 DIAGNOSIS — Z9981 Dependence on supplemental oxygen: Secondary | ICD-10-CM | POA: Diagnosis not present

## 2021-12-14 DIAGNOSIS — E1142 Type 2 diabetes mellitus with diabetic polyneuropathy: Secondary | ICD-10-CM | POA: Diagnosis not present

## 2021-12-14 DIAGNOSIS — E1151 Type 2 diabetes mellitus with diabetic peripheral angiopathy without gangrene: Secondary | ICD-10-CM | POA: Diagnosis not present

## 2021-12-14 DIAGNOSIS — E039 Hypothyroidism, unspecified: Secondary | ICD-10-CM | POA: Diagnosis not present

## 2021-12-14 DIAGNOSIS — E519 Thiamine deficiency, unspecified: Secondary | ICD-10-CM | POA: Diagnosis not present

## 2021-12-14 DIAGNOSIS — E538 Deficiency of other specified B group vitamins: Secondary | ICD-10-CM | POA: Diagnosis not present

## 2021-12-14 DIAGNOSIS — G9341 Metabolic encephalopathy: Secondary | ICD-10-CM | POA: Diagnosis not present

## 2021-12-14 DIAGNOSIS — I130 Hypertensive heart and chronic kidney disease with heart failure and stage 1 through stage 4 chronic kidney disease, or unspecified chronic kidney disease: Secondary | ICD-10-CM | POA: Diagnosis not present

## 2021-12-14 DIAGNOSIS — G4733 Obstructive sleep apnea (adult) (pediatric): Secondary | ICD-10-CM | POA: Diagnosis not present

## 2021-12-15 DIAGNOSIS — E11649 Type 2 diabetes mellitus with hypoglycemia without coma: Secondary | ICD-10-CM | POA: Diagnosis not present

## 2021-12-15 DIAGNOSIS — J9622 Acute and chronic respiratory failure with hypercapnia: Secondary | ICD-10-CM | POA: Diagnosis not present

## 2021-12-15 DIAGNOSIS — E1151 Type 2 diabetes mellitus with diabetic peripheral angiopathy without gangrene: Secondary | ICD-10-CM | POA: Diagnosis not present

## 2021-12-15 DIAGNOSIS — G4733 Obstructive sleep apnea (adult) (pediatric): Secondary | ICD-10-CM | POA: Diagnosis not present

## 2021-12-15 DIAGNOSIS — E1140 Type 2 diabetes mellitus with diabetic neuropathy, unspecified: Secondary | ICD-10-CM | POA: Diagnosis not present

## 2021-12-15 DIAGNOSIS — M10041 Idiopathic gout, right hand: Secondary | ICD-10-CM | POA: Diagnosis not present

## 2021-12-15 DIAGNOSIS — E519 Thiamine deficiency, unspecified: Secondary | ICD-10-CM | POA: Diagnosis not present

## 2021-12-15 DIAGNOSIS — E1122 Type 2 diabetes mellitus with diabetic chronic kidney disease: Secondary | ICD-10-CM | POA: Diagnosis not present

## 2021-12-15 DIAGNOSIS — E875 Hyperkalemia: Secondary | ICD-10-CM | POA: Diagnosis not present

## 2021-12-15 DIAGNOSIS — K5903 Drug induced constipation: Secondary | ICD-10-CM | POA: Diagnosis not present

## 2021-12-15 DIAGNOSIS — R55 Syncope and collapse: Secondary | ICD-10-CM | POA: Diagnosis not present

## 2021-12-15 DIAGNOSIS — E039 Hypothyroidism, unspecified: Secondary | ICD-10-CM | POA: Diagnosis not present

## 2021-12-15 DIAGNOSIS — K439 Ventral hernia without obstruction or gangrene: Secondary | ICD-10-CM | POA: Diagnosis not present

## 2021-12-15 DIAGNOSIS — I130 Hypertensive heart and chronic kidney disease with heart failure and stage 1 through stage 4 chronic kidney disease, or unspecified chronic kidney disease: Secondary | ICD-10-CM | POA: Diagnosis not present

## 2021-12-15 DIAGNOSIS — G9341 Metabolic encephalopathy: Secondary | ICD-10-CM | POA: Diagnosis not present

## 2021-12-15 DIAGNOSIS — I5032 Chronic diastolic (congestive) heart failure: Secondary | ICD-10-CM | POA: Diagnosis not present

## 2021-12-15 DIAGNOSIS — J9621 Acute and chronic respiratory failure with hypoxia: Secondary | ICD-10-CM | POA: Diagnosis not present

## 2021-12-15 DIAGNOSIS — E569 Vitamin deficiency, unspecified: Secondary | ICD-10-CM | POA: Diagnosis not present

## 2021-12-15 DIAGNOSIS — E785 Hyperlipidemia, unspecified: Secondary | ICD-10-CM | POA: Diagnosis not present

## 2021-12-15 DIAGNOSIS — N1832 Chronic kidney disease, stage 3b: Secondary | ICD-10-CM | POA: Diagnosis not present

## 2021-12-15 DIAGNOSIS — D509 Iron deficiency anemia, unspecified: Secondary | ICD-10-CM | POA: Diagnosis not present

## 2021-12-16 DIAGNOSIS — E1140 Type 2 diabetes mellitus with diabetic neuropathy, unspecified: Secondary | ICD-10-CM | POA: Diagnosis not present

## 2021-12-16 DIAGNOSIS — R55 Syncope and collapse: Secondary | ICD-10-CM | POA: Diagnosis not present

## 2021-12-16 DIAGNOSIS — D509 Iron deficiency anemia, unspecified: Secondary | ICD-10-CM | POA: Diagnosis not present

## 2021-12-16 DIAGNOSIS — E519 Thiamine deficiency, unspecified: Secondary | ICD-10-CM | POA: Diagnosis not present

## 2021-12-16 DIAGNOSIS — J9621 Acute and chronic respiratory failure with hypoxia: Secondary | ICD-10-CM | POA: Diagnosis not present

## 2021-12-16 DIAGNOSIS — N1832 Chronic kidney disease, stage 3b: Secondary | ICD-10-CM | POA: Diagnosis not present

## 2021-12-16 DIAGNOSIS — E875 Hyperkalemia: Secondary | ICD-10-CM | POA: Diagnosis not present

## 2021-12-16 DIAGNOSIS — J9622 Acute and chronic respiratory failure with hypercapnia: Secondary | ICD-10-CM | POA: Diagnosis not present

## 2021-12-16 DIAGNOSIS — K5903 Drug induced constipation: Secondary | ICD-10-CM | POA: Diagnosis not present

## 2021-12-16 DIAGNOSIS — M10041 Idiopathic gout, right hand: Secondary | ICD-10-CM | POA: Diagnosis not present

## 2021-12-16 DIAGNOSIS — E1151 Type 2 diabetes mellitus with diabetic peripheral angiopathy without gangrene: Secondary | ICD-10-CM | POA: Diagnosis not present

## 2021-12-16 DIAGNOSIS — G4733 Obstructive sleep apnea (adult) (pediatric): Secondary | ICD-10-CM | POA: Diagnosis not present

## 2021-12-16 DIAGNOSIS — G9341 Metabolic encephalopathy: Secondary | ICD-10-CM | POA: Diagnosis not present

## 2021-12-16 DIAGNOSIS — I130 Hypertensive heart and chronic kidney disease with heart failure and stage 1 through stage 4 chronic kidney disease, or unspecified chronic kidney disease: Secondary | ICD-10-CM | POA: Diagnosis not present

## 2021-12-16 DIAGNOSIS — K439 Ventral hernia without obstruction or gangrene: Secondary | ICD-10-CM | POA: Diagnosis not present

## 2021-12-16 DIAGNOSIS — E039 Hypothyroidism, unspecified: Secondary | ICD-10-CM | POA: Diagnosis not present

## 2021-12-16 DIAGNOSIS — I5032 Chronic diastolic (congestive) heart failure: Secondary | ICD-10-CM | POA: Diagnosis not present

## 2021-12-16 DIAGNOSIS — E785 Hyperlipidemia, unspecified: Secondary | ICD-10-CM | POA: Diagnosis not present

## 2021-12-16 DIAGNOSIS — E569 Vitamin deficiency, unspecified: Secondary | ICD-10-CM | POA: Diagnosis not present

## 2021-12-16 DIAGNOSIS — E1122 Type 2 diabetes mellitus with diabetic chronic kidney disease: Secondary | ICD-10-CM | POA: Diagnosis not present

## 2021-12-16 DIAGNOSIS — E11649 Type 2 diabetes mellitus with hypoglycemia without coma: Secondary | ICD-10-CM | POA: Diagnosis not present

## 2021-12-17 DIAGNOSIS — N1832 Chronic kidney disease, stage 3b: Secondary | ICD-10-CM | POA: Diagnosis not present

## 2021-12-17 DIAGNOSIS — I130 Hypertensive heart and chronic kidney disease with heart failure and stage 1 through stage 4 chronic kidney disease, or unspecified chronic kidney disease: Secondary | ICD-10-CM | POA: Diagnosis not present

## 2021-12-17 DIAGNOSIS — E875 Hyperkalemia: Secondary | ICD-10-CM | POA: Diagnosis not present

## 2021-12-17 DIAGNOSIS — K439 Ventral hernia without obstruction or gangrene: Secondary | ICD-10-CM | POA: Diagnosis not present

## 2021-12-17 DIAGNOSIS — E1122 Type 2 diabetes mellitus with diabetic chronic kidney disease: Secondary | ICD-10-CM | POA: Diagnosis not present

## 2021-12-17 DIAGNOSIS — G4733 Obstructive sleep apnea (adult) (pediatric): Secondary | ICD-10-CM | POA: Diagnosis not present

## 2021-12-17 DIAGNOSIS — E11649 Type 2 diabetes mellitus with hypoglycemia without coma: Secondary | ICD-10-CM | POA: Diagnosis not present

## 2021-12-17 DIAGNOSIS — E1151 Type 2 diabetes mellitus with diabetic peripheral angiopathy without gangrene: Secondary | ICD-10-CM | POA: Diagnosis not present

## 2021-12-17 DIAGNOSIS — K5903 Drug induced constipation: Secondary | ICD-10-CM | POA: Diagnosis not present

## 2021-12-17 DIAGNOSIS — J9621 Acute and chronic respiratory failure with hypoxia: Secondary | ICD-10-CM | POA: Diagnosis not present

## 2021-12-17 DIAGNOSIS — D509 Iron deficiency anemia, unspecified: Secondary | ICD-10-CM | POA: Diagnosis not present

## 2021-12-17 DIAGNOSIS — G9341 Metabolic encephalopathy: Secondary | ICD-10-CM | POA: Diagnosis not present

## 2021-12-17 DIAGNOSIS — E519 Thiamine deficiency, unspecified: Secondary | ICD-10-CM | POA: Diagnosis not present

## 2021-12-17 DIAGNOSIS — I5032 Chronic diastolic (congestive) heart failure: Secondary | ICD-10-CM | POA: Diagnosis not present

## 2021-12-17 DIAGNOSIS — E039 Hypothyroidism, unspecified: Secondary | ICD-10-CM | POA: Diagnosis not present

## 2021-12-17 DIAGNOSIS — E569 Vitamin deficiency, unspecified: Secondary | ICD-10-CM | POA: Diagnosis not present

## 2021-12-17 DIAGNOSIS — J9622 Acute and chronic respiratory failure with hypercapnia: Secondary | ICD-10-CM | POA: Diagnosis not present

## 2021-12-17 DIAGNOSIS — E785 Hyperlipidemia, unspecified: Secondary | ICD-10-CM | POA: Diagnosis not present

## 2021-12-17 DIAGNOSIS — M10041 Idiopathic gout, right hand: Secondary | ICD-10-CM | POA: Diagnosis not present

## 2021-12-17 DIAGNOSIS — E1140 Type 2 diabetes mellitus with diabetic neuropathy, unspecified: Secondary | ICD-10-CM | POA: Diagnosis not present

## 2021-12-17 DIAGNOSIS — R55 Syncope and collapse: Secondary | ICD-10-CM | POA: Diagnosis not present

## 2021-12-27 ENCOUNTER — Other Ambulatory Visit: Payer: Self-pay

## 2022-02-21 ENCOUNTER — Other Ambulatory Visit (HOSPITAL_COMMUNITY): Payer: Self-pay | Admitting: Cardiology

## 2022-02-22 ENCOUNTER — Encounter (HOSPITAL_BASED_OUTPATIENT_CLINIC_OR_DEPARTMENT_OTHER): Payer: Medicare Other | Attending: General Surgery | Admitting: General Surgery

## 2022-02-22 DIAGNOSIS — E538 Deficiency of other specified B group vitamins: Secondary | ICD-10-CM | POA: Insufficient documentation

## 2022-02-22 DIAGNOSIS — E039 Hypothyroidism, unspecified: Secondary | ICD-10-CM | POA: Insufficient documentation

## 2022-02-22 DIAGNOSIS — E1142 Type 2 diabetes mellitus with diabetic polyneuropathy: Secondary | ICD-10-CM | POA: Insufficient documentation

## 2022-02-22 DIAGNOSIS — I5032 Chronic diastolic (congestive) heart failure: Secondary | ICD-10-CM | POA: Diagnosis not present

## 2022-02-22 DIAGNOSIS — G4733 Obstructive sleep apnea (adult) (pediatric): Secondary | ICD-10-CM | POA: Insufficient documentation

## 2022-02-22 DIAGNOSIS — Z992 Dependence on renal dialysis: Secondary | ICD-10-CM | POA: Diagnosis not present

## 2022-02-22 DIAGNOSIS — Z6841 Body Mass Index (BMI) 40.0 and over, adult: Secondary | ICD-10-CM | POA: Diagnosis not present

## 2022-02-22 DIAGNOSIS — E1122 Type 2 diabetes mellitus with diabetic chronic kidney disease: Secondary | ICD-10-CM | POA: Insufficient documentation

## 2022-02-22 DIAGNOSIS — K439 Ventral hernia without obstruction or gangrene: Secondary | ICD-10-CM | POA: Diagnosis not present

## 2022-02-22 DIAGNOSIS — E1151 Type 2 diabetes mellitus with diabetic peripheral angiopathy without gangrene: Secondary | ICD-10-CM | POA: Diagnosis not present

## 2022-02-22 DIAGNOSIS — E11621 Type 2 diabetes mellitus with foot ulcer: Secondary | ICD-10-CM | POA: Diagnosis not present

## 2022-02-22 DIAGNOSIS — L97422 Non-pressure chronic ulcer of left heel and midfoot with fat layer exposed: Secondary | ICD-10-CM | POA: Diagnosis present

## 2022-02-22 DIAGNOSIS — N186 End stage renal disease: Secondary | ICD-10-CM | POA: Diagnosis not present

## 2022-02-22 DIAGNOSIS — D509 Iron deficiency anemia, unspecified: Secondary | ICD-10-CM | POA: Insufficient documentation

## 2022-02-22 DIAGNOSIS — I132 Hypertensive heart and chronic kidney disease with heart failure and with stage 5 chronic kidney disease, or end stage renal disease: Secondary | ICD-10-CM | POA: Insufficient documentation

## 2022-02-22 NOTE — Progress Notes (Addendum)
AVENIR, LOZINSKI ([id number]) Visit Report for 02/22/2022 Allergy List Details Patient Name: Date of Service: [name], [name] 02/22/2022 8:00 A M Medical Record Number: [id number] Patient Account Number: [id number] Date of Birth/Sex: Treating RN: Jan 28, 1956 (66 y.o. [name] Primary Care Tifany Hirsch: Sherrill Raring, Blenda Mounts Other Clinician: Referring Joshua Soulier: Treating Marquell Saenz/Extender: Fredirick Maudlin BRO WN, BEV ERLY Weeks in Treatment: 0 Allergies Active Allergies Shellfish Containing Products Reaction: anaphylaxis Severity: Severe lisinopril Reaction: Cough Severity: Moderate Allergy Notes Electronic Signature(s) Signed: 02/22/2022 4:38:52 PM By: [name] RN Entered By: [name] on 02/22/2022 08:18:54 -------------------------------------------------------------------------------- Arrival Information Details Patient Name: Date of Service: [name], [name] 02/22/2022 8:00 A M Medical Record Number: [id number] Patient Account Number: [id number] Date of Birth/Sex: Treating RN: 26-Jul-1956 (66 y.o. [name] Primary Care Ayeisha Lindenberger: Sherrill Raring, Blenda Mounts Other Clinician: Referring Octaviano Mukai: Treating Tyeson Tanimoto/Extender: Fredirick Maudlin BRO WN, BEV ERLY Weeks in Treatment: 0 Visit Information Patient Arrived: Wheel Chair Arrival Time: 08:16 Accompanied By: self Transfer Assistance: None Patient Identification Verified: Yes History Since Last Visit Added or deleted any medications: No Any new allergies or adverse reactions: No Had a fall or experienced change in activities of daily living that may affect risk of falls: No Signs or symptoms of abuse/neglect since last visito No Hospitalized since last visit: No Implantable device outside of the clinic excluding cellular tissue based products placed in the center since last visit: No Has Dressing in Place as Prescribed: Yes Pain Present Now: Yes Electronic Signature(s) Signed: 02/22/2022 4:38:52 PM By: [name]  RN Entered By: [name] on 02/22/2022 08:53:40 -------------------------------------------------------------------------------- Clinic Level of Care Assessment Details Patient Name: Date of Service: [name], [name] 02/22/2022 8:00 A M Medical Record Number: [id number] Patient Account Number: [id number] Date of Birth/Sex: Treating RN: Sep 10, 1956 (66 y.o. [name] Primary Care Brenetta Penny: Sherrill Raring, Blenda Mounts Other Clinician: Referring Stacey Sago: Treating Hodge Stachnik/Extender: Fredirick Maudlin BRO WN, BEV ERLY Weeks in Treatment: 0 Clinic Level of Care Assessment Items TOOL 1 Quantity Score X- 1 0 Use when EandM and Procedure is performed on INITIAL visit ASSESSMENTS - Nursing Assessment / Reassessment X- 1 20 General Physical Exam (combine w/ comprehensive assessment (listed just below) when performed on new pt. evals) X- 1 25 Comprehensive Assessment (HX, ROS, Risk Assessments, Wounds Hx, etc.) ASSESSMENTS - Wound and Skin Assessment / Reassessment X- 1 10 Dermatologic / Skin Assessment (not related to wound area) ASSESSMENTS - Ostomy and/or Continence Assessment and Care '[]'$  - 0 Incontinence Assessment and Management '[]'$  - 0 Ostomy Care Assessment and Management (repouching, etc.) PROCESS - Coordination of Care X - Simple Patient / Family Education for ongoing care 1 15 '[]'$  - 0 Complex (extensive) Patient / Family Education for ongoing care X- 1 10 Staff obtains Programmer, systems, Records, T Results / Process Orders est X- 1 10 Staff telephones HHA, Nursing Homes / Clarify orders / etc '[]'$  - 0 Routine Transfer to another Facility (non-emergent condition) '[]'$  - 0 Routine Hospital Admission (non-emergent condition) X- 1 15 New Admissions / Biomedical engineer / Ordering NPWT Apligraf, etc. , '[]'$  - 0 Emergency Hospital Admission (emergent condition) PROCESS - Special Needs '[]'$  - 0 Pediatric / Minor Patient Management '[]'$  - 0 Isolation Patient Management '[]'$  - 0 Hearing /  Language / Visual special needs '[]'$  - 0 Assessment of Community assistance (transportation, D/C planning, etc.) '[]'$  - 0 Additional assistance / Altered mentation '[]'$  - 0 Support Surface(s) Assessment (bed, cushion, seat, etc.) INTERVENTIONS - Miscellaneous '[]'$  - 0 External ear exam '[]'$  -  0 Patient Transfer (multiple staff / Civil Service fast streamer / Similar devices) '[]'$  - 0 Simple Staple / Suture removal (25 or less) '[]'$  - 0 Complex Staple / Suture removal (26 or more) '[]'$  - 0 Hypo/Hyperglycemic Management (do not check if billed separately) X- 1 15 Ankle / Brachial Index (ABI) - do not check if billed separately Has the patient been seen at the hospital within the last three years: Yes Total Score: 120 Level Of Care: New/Established - Level 4 Electronic Signature(s) Signed: 02/22/2022 4:38:52 PM By: [name] RN Entered By: [name] on 02/22/2022 16:33:56 -------------------------------------------------------------------------------- Encounter Discharge Information Details Patient Name: Date of Service: [name], [name] 02/22/2022 8:00 A M Medical Record Number: [id number] Patient Account Number: [id number] Date of Birth/Sex: Treating RN: 09/30/1955 (66 y.o. [name] Primary Care Justus Droke: Sherrill Raring, Blenda Mounts Other Clinician: Referring Tanieka Pownall: Treating Marshon Bangs/Extender: Fredirick Maudlin BRO WN, BEV ERLY Weeks in Treatment: 0 Encounter Discharge Information Items Post Procedure Vitals Discharge Condition: Stable Temperature (F): 97.9 Ambulatory Status: Wheelchair Pulse (bpm): 68 Discharge Destination: Home Respiratory Rate (breaths/min): 18 Transportation: Private Auto Blood Pressure (mmHg): 106/63 Accompanied By: self Schedule Follow-up Appointment: Yes Clinical Summary of Care: Patient Declined Electronic Signature(s) Signed: 02/22/2022 4:38:52 PM By: [name] RN Entered By: [name] on 02/22/2022  16:38:26 -------------------------------------------------------------------------------- Lower Extremity Assessment Details Patient Name: Date of Service: DAHL, HIGINBOTHAM 02/22/2022 8:00 A M Medical Record Number: [id number] Patient Account Number: [id number] Date of Birth/Sex: Treating RN: [birth year] (66 y.o. [name] Primary Care Lilymae Swiech: Sherrill Raring, Blenda Mounts Other Clinician: Referring Tiye Huwe: Treating Finas Delone/Extender: Fredirick Maudlin BRO WN, BEV ERLY Weeks in Treatment: 0 Edema Assessment Assessed: [Left: No] [Right: No] Edema: [Left: Ye] [Right: s] Calf Left: Right: Point of Measurement: 37 cm From Medial Instep 52 cm Ankle Left: Right: Point of Measurement: 14 cm From Medial Instep 26.8 cm Knee To Floor Left: Right: From Medial Instep 50 cm Vascular Assessment Pulses: Dorsalis Pedis Palpable: [Left:Yes] Blood Pressure: Brachial: [Left:106] Ankle: [Left:Dorsalis Pedis: 122 1.15] Electronic Signature(s) Signed: 02/22/2022 4:38:52 PM By: [name] RN Entered By: [name] on 02/22/2022 08:46:46 -------------------------------------------------------------------------------- Multi Wound Chart Details Patient Name: Date of Service: [name], Chais 02/22/2022 8:00 A M Medical Record Number: [id number] Patient Account Number: [id number] Date of Birth/Sex: Treating RN: 20-Sep-1955 (66 y.o. [name] Primary Care Haruki Arnold: Sherrill Raring, Blenda Mounts Other Clinician: Referring Antara Brecheisen: Treating Lindberg Zenon/Extender: Fredirick Maudlin BRO WN, BEV ERLY Weeks in Treatment: 0 Vital Signs Height(in): 72 Pulse(bpm): 89 Weight(lbs): 392 Blood Pressure(mmHg): 106/63 Body Mass Index(BMI): 53.2 Temperature(F): 97.9 Respiratory Rate(breaths/min): 18 Photos: [N/A:N/A] Left, Lateral Calcaneus N/A N/A Wound Location: Gradually Appeared N/A N/A Wounding Event: Diabetic Wound/Ulcer of the Lower N/A N/A Primary Etiology: Extremity Sleep Apnea, Congestive  Heart N/A N/A Comorbid History: Failure, Hypertension, Type II Diabetes, Osteoarthritis, Neuropathy 02/08/2022 N/A N/A Date Acquired: 0 N/A N/A Weeks of Treatment: Open N/A N/A Wound Status: No N/A N/A Wound Recurrence: 0.7x2.2x0.4 N/A N/A Measurements L x W x D (cm) 1.21 N/A N/A A (cm) : rea 0.484 N/A N/A Volume (cm) : 0.00% N/A N/A % Reduction in A rea: 0.00% N/A N/A % Reduction in Volume: Grade 2 N/A N/A Classification: Medium N/A N/A Exudate A mount: Serosanguineous N/A N/A Exudate Type: red, brown N/A N/A Exudate Color: Small (1-33%) N/A N/A Granulation A mount: Red N/A N/A Granulation Quality: Large (67-100%) N/A N/A Necrotic A mount: Eschar, Adherent Slough N/A N/A Necrotic Tissue: Fat Layer (Subcutaneous Tissue): Yes N/A N/A Exposed Structures: Fascia:  No Tendon: No Muscle: No Joint: No Bone: No None N/A N/A Epithelialization: Debridement - Excisional N/A N/A Debridement: Pre-procedure Verification/Time Out 09:06 N/A N/A Taken: Other N/A N/A Pain Control: Necrotic/Eschar, Fat, Callus, N/A N/A Tissue Debrided: Subcutaneous, Slough Skin/Subcutaneous Tissue N/A N/A Level: 1.54 N/A N/A Debridement A (sq cm): rea Curette N/A N/A Instrument: Minimum N/A N/A Bleeding: Pressure N/A N/A Hemostasis Achieved: 0 N/A N/A Procedural Pain: 0 N/A N/A Post Procedural Pain: Debridement Treatment Response: Procedure was tolerated well N/A N/A Post Debridement Measurements L x 0.7x2.2x0.4 N/A N/A W x D (cm) 0.484 N/A N/A Post Debridement Volume: (cm) Debridement N/A N/A Procedures Performed: Treatment Notes Electronic Signature(s) Signed: 02/22/2022 11:41:11 AM By: Fredirick Maudlin MD FACS Signed: 02/22/2022 4:38:52 PM By: [name] RN Entered By: Fredirick Maudlin on 02/22/2022 11:41:11 -------------------------------------------------------------------------------- Multi-Disciplinary Care Plan Details Patient Name: Date of  Service: [name], [place] 02/22/2022 8:00 A M Medical Record Number: [id number] Patient Account Number: [id number] Date of Birth/Sex: Treating RN: October 05, 1955 (66 y.o. [name] Primary Care Olesya Wike: Sherrill Raring, Blenda Mounts Other Clinician: Referring Pallie Swigert: Treating Maeson Lourenco/Extender: Fredirick Maudlin BRO WN, BEV ERLY Weeks in Treatment: 0 Active Inactive Wound/Skin Impairment Nursing Diagnoses: Impaired tissue integrity Goals: Patient/caregiver will verbalize understanding of skin care regimen Date Initiated: 02/22/2022 Target Resolution Date: 03/24/2022 Goal Status: Active Interventions: Assess ulceration(s) every visit Treatment Activities: Skin care regimen initiated : 02/22/2022 Notes: Electronic Signature(s) Signed: 02/22/2022 4:38:52 PM By: [name] RN Entered By: [name] on 02/22/2022 09:25:47 -------------------------------------------------------------------------------- Pain Assessment Details Patient Name: Date of Service: [name], Darnelle 02/22/2022 8:00 A M Medical Record Number: [id number] Patient Account Number: [id number] Date of Birth/Sex: Treating RN: 06-Jan-1956 (66 y.o. [name] Primary Care Keelyn Monjaras: Sherrill Raring, Blenda Mounts Other Clinician: Referring Daylynn Stumpp: Treating Janise Gora/Extender: Fredirick Maudlin BRO WN, BEV ERLY Weeks in Treatment: 0 Active Problems Location of Pain Severity and Description of Pain Patient Has Paino Yes Site Locations Pain Location: Generalized Pain With Dressing Change: Yes Duration of the Pain. Constant / Intermittento Constant Rate the pain. Current Pain Level: 10 Worst Pain Level: 10 Least Pain Level: 7 Tolerable Pain Level: 7 Character of Pain Describe the Pain: Difficult to Pinpoint Pain Management and Medication Current Pain Management: Medication: Yes Cold Application: No Rest: Yes Massage: No Activity: No T.E.N.S.: No Heat Application: No Leg drop or elevation: No Is the Current Pain  Management Adequate: Adequate How does your wound impact your activities of daily livingo Sleep: No Bathing: No Appetite: No Relationship With Others: No Bladder Continence: No Emotions: No Bowel Continence: No Work: No Toileting: No Drive: No Dressing: No Hobbies: No Electronic Signature(s) Signed: 02/22/2022 4:38:52 PM By: [name] RN Entered By: [name] on 02/22/2022 08:39:03 -------------------------------------------------------------------------------- Patient/Caregiver Education Details Patient Name: Date of Service: [name], [place] 6/6/2023andnbsp8:00 [place] Record Number: [id number] Patient Account Number: [id number] Date of Birth/Gender: Treating RN: 10/25/1955 (66 y.o. [name] Primary Care Physician: Sherrill Raring, Blenda Mounts Other Clinician: Referring Physician: Treating Physician/Extender: Fredirick Maudlin BRO WN, BEV ERLY Weeks in Treatment: 0 Education Assessment Education Provided To: Patient Education Topics Provided Wound/Skin Impairment: Methods: Explain/Verbal Responses: Return demonstration correctly Electronic Signature(s) Signed: 02/22/2022 4:38:52 PM By: [name] RN Entered By: [name] on 02/22/2022 16:32:44 -------------------------------------------------------------------------------- Wound Assessment Details Patient Name: Date of Service: [name], Jhase 02/22/2022 8:00 A M Medical Record Number: [id number] Patient Account Number: [id number] Date of Birth/Sex: Treating RN: [birth year] (66 y.o. [name] Primary Care Qusai Kem: BRO WN, BEV [facility] Other  Clinician: Referring Chaney Maclaren: Treating Suprena Travaglini/Extender: Fredirick Maudlin BRO WN, BEV ERLY Weeks in Treatment: 0 Wound Status Wound Number: 20 Primary Diabetic Wound/Ulcer of the Lower Extremity Etiology: Wound Location: Left, Lateral Calcaneus Wound Open Wounding Event: Gradually Appeared Status: Date Acquired: 02/08/2022 Comorbid Sleep Apnea,  Congestive Heart Failure, Hypertension, Type II Weeks Of Treatment: 0 History: Diabetes, Osteoarthritis, Neuropathy Clustered Wound: No Photos Wound Measurements Length: (cm) 0.7 Width: (cm) 2.2 Depth: (cm) 0.4 Area: (cm) 1.21 Volume: (cm) 0.484 % Reduction in Area: 0% % Reduction in Volume: 0% Epithelialization: None Tunneling: No Undermining: No Wound Description Classification: Grade 2 Exudate Amount: Medium Exudate Type: Serosanguineous Exudate Color: red, brown Foul Odor After Cleansing: No Slough/Fibrino Yes Wound Bed Granulation Amount: Small (1-33%) Exposed Structure Granulation Quality: Red Fascia Exposed: No Necrotic Amount: Large (67-100%) Fat Layer (Subcutaneous Tissue) Exposed: Yes Necrotic Quality: Eschar, Adherent Slough Tendon Exposed: No Muscle Exposed: No Joint Exposed: No Bone Exposed: No Treatment Notes Wound #20 (Calcaneus) Wound Laterality: Left, Lateral Cleanser Soap and Water Discharge Instruction: May shower and wash wound with dial antibacterial soap and water prior to dressing change. Wound Cleanser Discharge Instruction: Cleanse the wound with wound cleanser prior to applying a clean dressing using gauze sponges, not tissue or cotton balls. Peri-Wound Care Topical Gentamicin Discharge Instruction: As directed by physician Primary Dressing Hydrofera Blue Classic Foam, 2x2 in Discharge Instruction: Moisten with saline prior to applying to wound bed Secondary Dressing Woven Gauze Sponge, Non-Sterile 4x4 in Discharge Instruction: Apply over primary dressing as directed. Secured With Elastic Bandage 4 inch (ACE bandage) Discharge Instruction: Secure with ACE bandage as directed. Kerlix Roll Sterile, 4.5x3.1 (in/yd) Discharge Instruction: Secure with Kerlix as directed. Paper Tape, 2x10 (in/yd) Discharge Instruction: Secure dressing with tape as directed. Compression Wrap Compression Stockings Add-Ons Electronic  Signature(s) Signed: 02/24/2022 6:19:32 PM By: [name] RN Previous Signature: 02/22/2022 4:38:52 PM Version By: [name] RN Entered By: [name] on 02/24/2022 17:46:01 -------------------------------------------------------------------------------- Vitals Details Patient Name: Date of Service: [name], Kishaun 02/22/2022 8:00 A M Medical Record Number: [id number] Patient Account Number: [id number] Date of Birth/Sex: Treating RN: [birth year] (66 y.o. [name] Primary Care Taraoluwa Thakur: Sherrill Raring, Blenda Mounts Other Clinician: Referring Shray Hunley: Treating Eyonna Sandstrom/Extender: Fredirick Maudlin BRO WN, BEV ERLY Weeks in Treatment: 0 Vital Signs Time Taken: 08:15 Temperature (F): 97.9 Height (in): 72 Pulse (bpm): 68 Source: Stated Respiratory Rate (breaths/min): 18 Weight (lbs): 392 Blood Pressure (mmHg): 106/63 Source: Stated Reference Range: 80 - 120 mg / dl Body Mass Index (BMI): 53.2 Electronic Signature(s) Signed: 02/22/2022 4:38:52 PM By: [name] RN Entered By: [name] on 02/22/2022 08:18:48

## 2022-02-22 NOTE — Progress Notes (Signed)
CHARLE, CLEAR ([id number]) Visit Report for 02/22/2022 Chief Complaint Document Details Patient Name: Date of Service: [name], [name] 02/22/2022 8:00 A M Medical Record Number: [id number] Patient Account Number: [id number] Date of Birth/Sex: Treating RN: 04/11/1956 (66 y.o. [name] Primary Care Provider: Sherrill Raring, Blenda Mounts Other Clinician: Referring Provider: Treating Provider/Extender: Fredirick Maudlin BRO WN, BEV ERLY Weeks in Treatment: 0 Information Obtained from: Patient Chief Complaint 11/09/2018; patient is here for review of wounds on his dorsal right first and second toes and on the dorsal left first toe. 02/22/2022: The patient is here for a new ulcer on his posterior left heel. Electronic Signature(s) Signed: 02/22/2022 11:41:34 AM By: Fredirick Maudlin MD FACS Entered By: Fredirick Maudlin on 02/22/2022 11:41:34 -------------------------------------------------------------------------------- Debridement Details Patient Name: Date of Service: [name], [name] 02/22/2022 8:00 A M Medical Record Number: [id number] Patient Account Number: [id number] Date of Birth/Sex: Treating RN: 10-18-1955 (66 y.o. [name] Primary Care Provider: Sherrill Raring, Blenda Mounts Other Clinician: Referring Provider: Treating Provider/Extender: Fredirick Maudlin BRO WN, BEV ERLY Weeks in Treatment: 0 Debridement Performed for Assessment: Wound #20 Left,Lateral Calcaneus Performed By: Physician Fredirick Maudlin, MD Debridement Type: Debridement Severity of Tissue Pre Debridement: Fat layer exposed Level of Consciousness (Pre-procedure): Awake and Alert Pre-procedure Verification/Time Out Yes - 09:06 Taken: Start Time: 09:06 Pain Control: Other : T Area Debrided (L x W): otal 0.7 (cm) x 2.2 (cm) = 1.54 (cm) Tissue and other material debrided: Viable, Non-Viable, Callus, Eschar, Fat, Slough, Subcutaneous, Slough Level: Skin/Subcutaneous Tissue Debridement Description: Excisional Instrument:  Curette Specimen: Tissue Culture Number of Specimens T aken: 1 Bleeding: Minimum Hemostasis Achieved: Pressure End Time: 09:08 Procedural Pain: 0 Post Procedural Pain: 0 Response to Treatment: Procedure was tolerated well Level of Consciousness (Post- Awake and Alert procedure): Post Debridement Measurements of Total Wound Length: (cm) 0.7 Width: (cm) 2.2 Depth: (cm) 0.4 Volume: (cm) 0.484 Character of Wound/Ulcer Post Debridement: Improved Severity of Tissue Post Debridement: Fat layer exposed Post Procedure Diagnosis Same as Pre-procedure Electronic Signature(s) Signed: 02/22/2022 12:24:33 PM By: Fredirick Maudlin MD FACS Signed: 02/22/2022 4:38:52 PM By: [name] RN Entered By: [name] on 02/22/2022 09:10:19 -------------------------------------------------------------------------------- HPI Details Patient Name: Date of Service: [name], [name] 02/22/2022 8:00 A M Medical Record Number: [id number] Patient Account Number: [id number] Date of Birth/Sex: Treating RN: 1956/06/09 (66 y.o. [name] Primary Care Provider: Sherrill Raring, Blenda Mounts Other Clinician: Referring Provider: Treating Provider/Extender: Fredirick Maudlin BRO WN, BEV ERLY Weeks in Treatment: 0 History of Present Illness HPI Description: READMISSION 11/09/2018 This is a now 66 year old man that we had in this clinic over a multitude of years but most recently in 2015. He had wounds on his plantar foot initially in 2007 and 2008 and I think subsequently was seen in 2012 then 2014 into the mid part of 2015 with wounds on his toes. He is a type II diabetic with peripheral neuropathy but no known PAD. The patient states he was doing well until about a month ago he noted an area on the left great toe which was superficial and then an area on the right great and second toes about a week later. He is not sure how this happened he simply noticed this when he was lying in bed at night. He has been using  peroxide. He has not seen a doctor. He has not been on antibiotics and has had no x-rays. Past medical history; type 2 diabetes with peripheral neuropathy, hypertension, hypothyroidism, low B12 levels, iron deficiency anemia,  ventral hernia, diabetic nephropathy, chronic diastolic heart failure and obstructive sleep apnea. ABIs in this clinic were 1.2 on the right and 1.1 on the left 2/28; patient arrived last week for areas on his left first right first and right second toes. X-ray of the right foot did not show any osseous abnormality. Culture of the right great toe showed Staphylococcus aureus methicillin sensitive and he is on dicloxacillin which he started 3 days ago. 3/6; patient with wounds on his right first and second toes and left first toes. The area over the left first toe is extensive we have been using silver alginate. Culture grew MSSA and he is on dicloxacillin which I prescribed on the 25th. He is still taking this which I am not really sure of the reason. He expressed knowledge that this was 4 times a day he should have been out of this around March 2 if he picked it up on the 25th or they gave him the right number of pills. Nevertheless our nurses report more purulent drainage and odor 3/17-Patient returns for the 3 wounds the left first toe and the right first and second toe dorsal surfaces. The left first toe is closed up and healed, the right first toe dorsal surface wound is extensive and appears the same as last time, no purulent drainage or odor reported per nurse, left second toe wound is slightly smaller. He has completed the dicloxacillin as of today. Noted that his culture previously had grown MSSA. We have been using calcium alginate to the wounds. X-rays have been reviewed. ABIs were reviewed as well. 3/27; patient returns for review of wounds on his right first and right second toes. The wound on the left first toe is healed. 4/10; 2-week follow-up. The area on the  right second toe dorsally is healed. Per the patient the left first toe remains healed. He still has an extensive area dorsally over the right first toe inner phalangeal joint area. Rolled up hyper granulated tissue with probably a nonviable surface 4/24; 2-week follow-up. The areas on the right second toe dorsally and right first toe dorsally is healed. The area on the dorsal left first toe over the inner phalangeal joint is smaller but still hyper granulated we have been using Hydrofera Blue 5/15; 3-week follow-up. The areas on the right first toe. Not the left as I said on 4/24. He is using Hydrofera Blue. 5/29; 3-week follow-up. The area on his dorsal right toe over the right inter phalangeal joint is just about closed. We have been using Hydrofera Blue. In passing he showed me his right thumb which is exquisitely tender over the tip of the digit. 6/5; the area on the dorsal right toe over the inner phalangeal joint is closed. Right thumb is better which I gave him antibiotics for last week. Still some tenderness but considerably improved READMISSION 02/22/2022 This is a 66 year old type II diabetic with congestive heart failure, end-stage renal disease on hemodialysis, peripheral artery disease, and morbid obesity. He has developed an ulcer on his left posterior heel that he believes is secondary to his health assistance not adequately moisturizing his feet. He says it first appeared about 2 weeks ago. He does not have sensation in his feet. He has not noticed any foul odor or drainage. His last hemoglobin A1c was 7.9 at the beginning of January. ABI in clinic today was 1.15. Electronic Signature(s) Signed: 02/22/2022 11:43:24 AM By: Fredirick Maudlin MD FACS Entered By: Fredirick Maudlin on 02/22/2022 11:43:24 -------------------------------------------------------------------------------- Physical  Exam Details Patient Name: Date of Service: [name], [name] 02/22/2022 8:00 A M Medical Record  Number: [id number] Patient Account Number: [id number] Date of Birth/Sex: Treating RN: 1956-09-18 (66 y.o. [name] Primary Care Provider: Sherrill Raring, Blenda Mounts Other Clinician: Referring Provider: Treating Provider/Extender: Fredirick Maudlin BRO WN, BEV ERLY Weeks in Treatment: 0 Constitutional . . . . Morbid obesity. No acute distress. Respiratory Normal work of breathing on room air.. Notes 02/22/2022: His left heel is covered with extremely thick, hard, dry skin and callus. There is an ulcer on the posterior calcaneus that has some slough and nonviable fat present. There is periwound callus accumulation. There is mild malodor to the wound. Electronic Signature(s) Signed: 02/22/2022 11:47:49 AM By: Fredirick Maudlin MD FACS Entered By: Fredirick Maudlin on 02/22/2022 11:47:49 -------------------------------------------------------------------------------- Physician Orders Details Patient Name: Date of Service: [name], [name] 02/22/2022 8:00 A M Medical Record Number: [id number] Patient Account Number: [id number] Date of Birth/Sex: Treating RN: 1956-09-18 (66 y.o. [name] Primary Care Provider: Sherrill Raring, Blenda Mounts Other Clinician: Referring Provider: Treating Provider/Extender: Fredirick Maudlin BRO WN, BEV ERLY Weeks in Treatment: 0 Verbal / Phone Orders: No Diagnosis Coding ICD-10 Coding Code Description L97.422 Non-pressure chronic ulcer of left heel and midfoot with fat layer exposed E11.621 Type 2 diabetes mellitus with foot ulcer N18.6 End stage renal disease I50.9 Heart failure, unspecified I10 Essential (primary) hypertension I73.9 Peripheral vascular disease, unspecified Follow-up Appointments ppointment in 1 week. - Dr Celine Ahr Room 3 Tuesday June 13th at 11am Return A Licensed [employer] Other Bathing/Shower/Hygiene Orders/Instructions: - change wound dressing after bathing Off-Loading Wound #20 Left,Lateral Calcaneus Other: - When in bed place a couple  of pillows behind the left calf to elevate heel off the bed [place] please change wound dressing 2 times a week. Wound Treatment Wound #20 - Calcaneus Wound Laterality: Left, Lateral Cleanser: Soap and Water 3 x Per Week/30 Days Discharge Instructions: May shower and wash wound with dial antibacterial soap and water prior to dressing change. Cleanser: Wound Cleanser 3 x Per Week/30 Days Discharge Instructions: Cleanse the wound with wound cleanser prior to applying a clean dressing using gauze sponges, not tissue or cotton balls. Topical: Gentamicin 3 x Per Week/30 Days Discharge Instructions: As directed by physician Prim Dressing: Hydrofera Blue Classic Foam, 2x2 in 3 x Per Week/30 Days ary Discharge Instructions: Moisten with saline prior to applying to wound bed Secondary Dressing: Woven Gauze Sponge, Non-Sterile 4x4 in 3 x Per Week/30 Days Discharge Instructions: Apply over primary dressing as directed. Secured With: Elastic Bandage 4 inch (ACE bandage) 3 x Per Week/30 Days Discharge Instructions: Secure with ACE bandage as directed. Secured With: [employer], 4.5x3.1 (in/yd) 3 x Per Week/30 Days Discharge Instructions: Secure with Kerlix as directed. Secured With: Paper Tape, 2x10 (in/yd) 3 x Per Week/30 Days Discharge Instructions: Secure dressing with tape as directed. Patient Medications llergies: Shellfish Containing Products, lisinopril A Notifications Medication Indication Start End 02/22/2022 gentamicin DOSE topical 0.1 % ointment - apply to wound bed with dressing changes as directed Electronic Signature(s) Signed: 02/22/2022 11:48:59 AM By: Fredirick Maudlin MD FACS Entered By: Fredirick Maudlin on 02/22/2022 11:48:58 -------------------------------------------------------------------------------- Problem List Details Patient Name: Date of Service: [name], Mearle 02/22/2022 8:00 A M Medical Record Number:  [id number] Patient Account Number: [id number] Date of Birth/Sex: Treating RN: 1956-07-06 (66 y.o. [name] Primary Care Provider: Sherrill Raring, Blenda Mounts Other Clinician: Referring Provider: Treating Provider/Extender: Fredirick Maudlin  BRO WN, BEV ERLY Weeks in Treatment: 0 Active Problems ICD-10 Encounter Code Description Active Date MDM Diagnosis L97.422 Non-pressure chronic ulcer of left heel and midfoot with fat layer exposed 02/22/2022 No Yes E11.621 Type 2 diabetes mellitus with foot ulcer 02/22/2022 No Yes N18.6 End stage renal disease 02/22/2022 No Yes I50.9 Heart failure, unspecified 02/22/2022 No Yes I10 Essential (primary) hypertension 02/22/2022 No Yes I73.9 Peripheral vascular disease, unspecified 02/22/2022 No Yes Inactive Problems Resolved Problems Electronic Signature(s) Signed: 02/22/2022 11:40:54 AM By: Fredirick Maudlin MD FACS Entered By: Fredirick Maudlin on 02/22/2022 11:40:54 -------------------------------------------------------------------------------- Progress Note Details Patient Name: Date of Service: [name], [name] 02/22/2022 8:00 A M Medical Record Number: [id number] Patient Account Number: [id number] Date of Birth/Sex: Treating RN: [birth year] (66 y.o. [name] Primary Care Provider: Sherrill Raring, Blenda Mounts Other Clinician: Referring Provider: Treating Provider/Extender: Fredirick Maudlin BRO WN, BEV ERLY Weeks in Treatment: 0 Subjective Chief Complaint Information obtained from Patient 11/09/2018; patient is here for review of wounds on his dorsal right first and second toes and on the dorsal left first toe. 02/22/2022: The patient is here for a new ulcer on his posterior left heel. History of Present Illness (HPI) READMISSION 11/09/2018 This is a now 66 year old man that we had in this clinic over a multitude of years but most recently in 2015. He had wounds on his plantar foot initially in 2007 and 2008 and I think subsequently was seen in 2012 then 2014  into the mid part of 2015 with wounds on his toes. He is a type II diabetic with peripheral neuropathy but no known PAD. The patient states he was doing well until about a month ago he noted an area on the left great toe which was superficial and then an area on the right great and second toes about a week later. He is not sure how this happened he simply noticed this when he was lying in bed at night. He has been using peroxide. He has not seen a doctor. He has not been on antibiotics and has had no x-rays. Past medical history; type 2 diabetes with peripheral neuropathy, hypertension, hypothyroidism, low B12 levels, iron deficiency anemia, ventral hernia, diabetic nephropathy, chronic diastolic heart failure and obstructive sleep apnea. ABIs in this clinic were 1.2 on the right and 1.1 on the left 2/28; patient arrived last week for areas on his left first right first and right second toes. X-ray of the right foot did not show any osseous abnormality. Culture of the right great toe showed Staphylococcus aureus methicillin sensitive and he is on dicloxacillin which he started 3 days ago. 3/6; patient with wounds on his right first and second toes and left first toes. The area over the left first toe is extensive we have been using silver alginate. Culture grew MSSA and he is on dicloxacillin which I prescribed on the 25th. He is still taking this which I am not really sure of the reason. He expressed knowledge that this was 4 times a day he should have been out of this around March 2 if he picked it up on the 25th or they gave him the right number of pills. Nevertheless our nurses report more purulent drainage and odor 3/17-Patient returns for the 3 wounds the left first toe and the right first and second toe dorsal surfaces. The left first toe is closed up and healed, the right first toe dorsal surface wound is extensive and appears the same as last time, no purulent drainage or  odor reported per  nurse, left second toe wound is slightly smaller. He has completed the dicloxacillin as of today. Noted that his culture previously had grown MSSA. We have been using calcium alginate to the wounds. X-rays have been reviewed. ABIs were reviewed as well. 3/27; patient returns for review of wounds on his right first and right second toes. The wound on the left first toe is healed. 4/10; 2-week follow-up. The area on the right second toe dorsally is healed. Per the patient the left first toe remains healed. He still has an extensive area dorsally over the right first toe inner phalangeal joint area. Rolled up hyper granulated tissue with probably a nonviable surface 4/24; 2-week follow-up. The areas on the right second toe dorsally and right first toe dorsally is healed. The area on the dorsal left first toe over the inner phalangeal joint is smaller but still hyper granulated we have been using Hydrofera Blue 5/15; 3-week follow-up. The areas on the right first toe. Not the left as I said on 4/24. He is using Hydrofera Blue. 5/29; 3-week follow-up. The area on his dorsal right toe over the right inter phalangeal joint is just about closed. We have been using Hydrofera Blue. In passing he showed me his right thumb which is exquisitely tender over the tip of the digit. 6/5; the area on the dorsal right toe over the inner phalangeal joint is closed. Right thumb is better which I gave him antibiotics for last week. Still some tenderness but considerably improved READMISSION 02/22/2022 This is a 66 year old type II diabetic with congestive heart failure, end-stage renal disease on hemodialysis, peripheral artery disease, and morbid obesity. He has developed an ulcer on his left posterior heel that he believes is secondary to his health assistance not adequately moisturizing his feet. He says it first appeared about 2 weeks ago. He does not have sensation in his feet. He has not noticed any foul odor or  drainage. His last hemoglobin A1c was 7.9 at the beginning of January. ABI in clinic today was 1.15. Patient History Information obtained from Patient. Allergies Shellfish Containing Products (Severity: Severe, Reaction: anaphylaxis), lisinopril (Severity: Moderate, Reaction: Cough) Family History Cancer - Mother,Father, Diabetes - Mother,Father,Siblings, Heart Disease - Siblings, Lung Disease - Mother, No family history of Hereditary Spherocytosis, Hypertension, Kidney Disease, Seizures, Stroke, Thyroid Problems, Tuberculosis. Social History Never smoker, Marital Status - Single, Alcohol Use - Never, Drug Use - No History, Caffeine Use - Daily - Coffee. Medical History Eyes Denies history of Cataracts, Glaucoma, Optic Neuritis Ear/Nose/Mouth/Throat Denies history of Chronic sinus problems/congestion, Middle ear problems Hematologic/Lymphatic Denies history of Anemia, Hemophilia, Human Immunodeficiency Virus, Lymphedema, Sickle Cell Disease Respiratory Patient has history of Sleep Apnea Denies history of Aspiration, Asthma, Chronic Obstructive Pulmonary Disease (COPD), Pneumothorax, Tuberculosis Cardiovascular Patient has history of Congestive Heart Failure, Hypertension Denies history of Angina, Arrhythmia, Coronary Artery Disease, Deep Vein Thrombosis, Hypotension, Myocardial Infarction, Peripheral Arterial Disease, Peripheral Venous Disease, Phlebitis, Vasculitis Gastrointestinal Denies history of Cirrhosis , Colitis, Crohnoos, Hepatitis A, Hepatitis B, Hepatitis C Endocrine Patient has history of Type II Diabetes Denies history of Type I Diabetes Genitourinary Denies history of End Stage Renal Disease Immunological Denies history of Lupus Erythematosus, Raynaudoos, Scleroderma Integumentary (Skin) Denies history of History of Burn Musculoskeletal Patient has history of Osteoarthritis Denies history of Gout, Rheumatoid Arthritis, Osteomyelitis Neurologic Patient has  history of Neuropathy - Diabetic Denies history of Dementia, Quadriplegia, Paraplegia, Seizure Disorder Oncologic Denies history of Received Chemotherapy, Received Radiation Psychiatric Denies  history of Anorexia/bulimia, Confinement Anxiety Objective Constitutional Morbid obesity. No acute distress. Vitals Time Taken: 8:15 AM, Height: 72 in, Source: Stated, Weight: 392 lbs, Source: Stated, BMI: 53.2, Temperature: 97.9 F, Pulse: 68 bpm, Respiratory Rate: 18 breaths/min, Blood Pressure: 106/63 mmHg. Respiratory Normal work of breathing on room air.. General Notes: 02/22/2022: His left heel is covered with extremely thick, hard, dry skin and callus. There is an ulcer on the posterior calcaneus that has some slough and nonviable fat present. There is periwound callus accumulation. There is mild malodor to the wound. Integumentary (Hair, Skin) Wound #20 status is Open. Original cause of wound was Gradually Appeared. The date acquired was: 02/08/2022. The wound is located on the Left,Lateral Calcaneus. The wound measures 0.7cm length x 2.2cm width x 0.4cm depth; 1.21cm^2 area and 0.484cm^3 volume. There is Fat Layer (Subcutaneous Tissue) exposed. There is no tunneling or undermining noted. There is a medium amount of serosanguineous drainage noted. There is small (1-33%) red granulation within the wound bed. There is a large (67-100%) amount of necrotic tissue within the wound bed including Eschar and Adherent Slough. Assessment Active Problems ICD-10 Non-pressure chronic ulcer of left heel and midfoot with fat layer exposed Type 2 diabetes mellitus with foot ulcer End stage renal disease Heart failure, unspecified Essential (primary) hypertension Peripheral vascular disease, unspecified Procedures Wound #20 Pre-procedure diagnosis of Wound #20 is a Diabetic Wound/Ulcer of the Lower Extremity located on the Left,Lateral Calcaneus .Severity of Tissue Pre Debridement is: Fat layer exposed.  There was a Excisional Skin/Subcutaneous Tissue Debridement with a total area of 1.54 sq cm performed by Fredirick Maudlin, MD. With the following instrument(s): Curette to remove Viable and Non-Viable tissue/material. Material removed includes Fat, Eschar, Callus, Subcutaneous Tissue, and Slough after achieving pain control using Other. 1 specimen was taken by a Tissue Culture and sent to the lab per facility protocol. A time out was conducted at 09:06, prior to the start of the procedure. A Minimum amount of bleeding was controlled with Pressure. The procedure was tolerated well with a pain level of 0 throughout and a pain level of 0 following the procedure. Post Debridement Measurements: 0.7cm length x 2.2cm width x 0.4cm depth; 0.484cm^3 volume. Character of Wound/Ulcer Post Debridement is improved. Severity of Tissue Post Debridement is: Fat layer exposed. Post procedure Diagnosis Wound #20: Same as Pre-Procedure Plan Follow-up Appointments: Return Appointment in 1 week. - Dr Celine Ahr Room 3 Tuesday June 13th at [employer]: Other Bathing/Shower/Hygiene Orders/Instructions: - change wound dressing after bathing Off-Loading: Wound #20 Left,Lateral Calcaneus: Other: - When in bed place a couple of pillows behind the left calf to elevate heel off the bed Home Health: Other Home Health Orders/Instructions: - Home Health please change wound dressing 2 times a week. The following medication(s) was prescribed: gentamicin topical 0.1 % ointment apply to wound bed with dressing changes as directed starting 02/22/2022 WOUND #20: - Calcaneus Wound Laterality: Left, Lateral Cleanser: Soap and Water 3 x Per Week/30 Days Discharge Instructions: May shower and wash wound with dial antibacterial soap and water prior to dressing change. Cleanser: Wound Cleanser 3 x Per Week/30 Days Discharge Instructions: Cleanse the wound with wound cleanser prior to applying a clean dressing using gauze  sponges, not tissue or cotton balls. Topical: Gentamicin 3 x Per Week/30 Days Discharge Instructions: As directed by physician Prim Dressing: Hydrofera Blue Classic Foam, 2x2 in 3 x Per Week/30 Days ary Discharge Instructions: Moisten with saline prior to applying to wound bed Secondary Dressing:  Woven Gauze Sponge, Non-Sterile 4x4 in 3 x Per Week/30 Days Discharge Instructions: Apply over primary dressing as directed. Secured With: Elastic Bandage 4 inch (ACE bandage) 3 x Per Week/30 Days Discharge Instructions: Secure with ACE bandage as directed. Secured With: [employer], 4.5x3.1 (in/yd) 3 x Per Week/30 Days Discharge Instructions: Secure with Kerlix as directed. Secured With: Paper T ape, 2x10 (in/yd) 3 x Per Week/30 Days Discharge Instructions: Secure dressing with tape as directed. 02/22/2022: His left heel is covered with extremely thick, hard, dry skin and callus. There is an ulcer on the posterior calcaneus that has some slough and nonviable fat present. There is periwound callus accumulation. There is mild malodor to the wound. I used a combination of manual and sharp debridement to remove all of the heavy dried skin from his heel and to debride the callus from around the wound. I also debrided the wound surface to remove the nonviable tissue. Based upon the odor, I elected to perform a PCR culture. Once those results were back, I will make further recommendations for therapy. In the meantime, we will apply topical gentamicin and Hydrofera Blue to the wound. He was advised to float his heels and avoid pressure on the area. I will see him back in 1 week's time. Electronic Signature(s) Signed: 02/22/2022 11:50:06 AM By: Fredirick Maudlin MD FACS Entered By: Fredirick Maudlin on 02/22/2022 11:50:06 -------------------------------------------------------------------------------- HxROS Details Patient Name: Date of Service: [name], [name] 02/22/2022 8:00 A M Medical Record Number:  [id number] Patient Account Number: [id number] Date of Birth/Sex: Treating RN: [birth year] (66 y.o. [name] Primary Care Provider: Sherrill Raring, Blenda Mounts Other Clinician: Referring Provider: Treating Provider/Extender: Fredirick Maudlin BRO WN, BEV ERLY Weeks in Treatment: 0 Information Obtained From Patient Eyes Medical History: Negative for: Cataracts; Glaucoma; Optic Neuritis Ear/Nose/Mouth/Throat Medical History: Negative for: Chronic sinus problems/congestion; Middle ear problems Hematologic/Lymphatic Medical History: Negative for: Anemia; Hemophilia; Human Immunodeficiency Virus; Lymphedema; Sickle Cell Disease Respiratory Medical History: Positive for: Sleep Apnea Negative for: Aspiration; Asthma; Chronic Obstructive Pulmonary Disease (COPD); Pneumothorax; Tuberculosis Cardiovascular Medical History: Positive for: Congestive Heart Failure; Hypertension Negative for: Angina; Arrhythmia; Coronary Artery Disease; Deep Vein Thrombosis; Hypotension; Myocardial Infarction; Peripheral Arterial Disease; Peripheral Venous Disease; Phlebitis; Vasculitis Gastrointestinal Medical History: Negative for: Cirrhosis ; Colitis; Crohns; Hepatitis A; Hepatitis B; Hepatitis C Endocrine Medical History: Positive for: Type II Diabetes Negative for: Type I Diabetes Time with diabetes: 1994 Treated with: Insulin Blood sugar tested every day: No Genitourinary Medical History: Negative for: End Stage Renal Disease Immunological Medical History: Negative for: Lupus Erythematosus; Raynauds; Scleroderma Integumentary (Skin) Medical History: Negative for: History of Burn Musculoskeletal Medical History: Positive for: Osteoarthritis Negative for: Gout; Rheumatoid Arthritis; Osteomyelitis Neurologic Medical History: Positive for: Neuropathy - Diabetic Negative for: Dementia; Quadriplegia; Paraplegia; Seizure Disorder Oncologic Medical History: Negative for: Received Chemotherapy;  Received Radiation Psychiatric Medical History: Negative for: Anorexia/bulimia; Confinement Anxiety Immunizations Pneumococcal Vaccine: Received Pneumococcal Vaccination: No Implantable Devices No devices added Family and Social History Cancer: Yes - Mother,Father; Diabetes: Yes - Mother,Father,Siblings; Heart Disease: Yes - Siblings; Hereditary Spherocytosis: No; Hypertension: No; Kidney Disease: No; Lung Disease: Yes - Mother; Seizures: No; Stroke: No; Thyroid Problems: No; Tuberculosis: No; Never smoker; Marital Status - Single; Alcohol Use: Never; Drug Use: No History; Caffeine Use: Daily - Coffee; Financial Concerns: No; Food, Clothing or Shelter Needs: No; Support System Lacking: No; Transportation Concerns: No Electronic Signature(s) Signed: 02/22/2022 12:24:33 PM By: Fredirick Maudlin MD FACS Signed: 02/22/2022 4:38:52 PM By: [name] RN Entered  By: [name] on 02/22/2022 08:19:20 -------------------------------------------------------------------------------- SuperBill Details Patient Name: Date of Service: [name], [name] 02/22/2022 Medical Record Number: [id number] Patient Account Number: [id number] Date of Birth/Sex: Treating RN: [birth year] (67 y.o. [name] Primary Care Provider: Sherrill Raring, Blenda Mounts Other Clinician: Referring Provider: Treating Provider/Extender: Fredirick Maudlin BRO WN, BEV ERLY Weeks in Treatment: 0 Diagnosis Coding ICD-10 Codes Code Description [phone] Non-pressure chronic ulcer of left heel and midfoot with fat layer exposed E11.621 Type 2 diabetes mellitus with foot ulcer N18.6 End stage renal disease I50.9 Heart failure, unspecified I10 Essential (primary) hypertension I73.9 Peripheral vascular disease, unspecified Facility Procedures CPT4 Code: [id number] Description: 42395 - WOUND CARE VISIT-LEV 4 EST PT Modifier: Quantity: 1 CPT4 Code: [id number] Description: 56861 - DEB SUBQ TISSUE 20 SQ CM/< ICD-10 Diagnosis Description  L97.422 Non-pressure chronic ulcer of left heel and midfoot with fat layer exposed Modifier: Quantity: 1 Physician Procedures : CPT4 Code Description Modifier [id number] 99204 - WC PHYS LEVEL 4 - NEW PT 25 ICD-10 Diagnosis Description L97.422 Non-pressure chronic ulcer of left heel and midfoot with fat layer exposed E11.621 Type 2 diabetes mellitus with foot ulcer N18.6 End stage  renal disease I73.9 Peripheral vascular disease, unspecified Quantity: 1 : [id number] 11042 - WC PHYS SUBQ TISS 20 SQ CM ICD-10 Diagnosis Description L97.422 Non-pressure chronic ulcer of left heel and midfoot with fat layer exposed Quantity: 1 Electronic Signature(s) Signed: 02/22/2022 4:38:52 PM By: [name] RN Signed: 02/22/2022 4:58:27 PM By: Fredirick Maudlin MD FACS Previous Signature: 02/22/2022 11:50:28 AM Version By: Fredirick Maudlin MD FACS Entered By: [name] on 02/22/2022 16:34:07

## 2022-02-22 NOTE — Progress Notes (Signed)
VITALY, WANAT ([id number]) Visit Report for 02/22/2022 Abuse Risk Screen Details Patient Name: Date of Service: [name], [name] 02/22/2022 8:00 A M Medical Record Number: [id number] Patient Account Number: [id number] Date of Birth/Sex: Treating RN: 15-Dec-1955 (66 y.o. [name] Primary Care Tashia Leiterman: Sherrill Raring, Blenda Mounts Other Clinician: Referring Tinnie Kunin: Treating Lynden Flemmer/Extender: Fredirick Maudlin BRO WN, BEV ERLY Weeks in Treatment: 0 Abuse Risk Screen Items Answer ABUSE RISK SCREEN: Has anyone close to you tried to hurt or harm you recentlyo No Do you feel uncomfortable with anyone in your familyo No Has anyone forced you do things that you didnt want to doo No Electronic Signature(s) Signed: 02/22/2022 4:38:52 PM By: [name] RN Entered By: [name] on 02/22/2022 08:19:28 -------------------------------------------------------------------------------- Activities of Daily Living Details Patient Name: Date of Service: [name], [name] 02/22/2022 8:00 A M Medical Record Number: [id number] Patient Account Number: [id number] Date of Birth/Sex: Treating RN: [birth year] (66 y.o. [name] Primary Care Landi Biscardi: Sherrill Raring, Blenda Mounts Other Clinician: Referring Nikitas Davtyan: Treating Janean Eischen/Extender: Fredirick Maudlin BRO WN, BEV ERLY Weeks in Treatment: 0 Activities of Daily Living Items Answer Activities of Daily Living (Please select one for each item) Drive Automobile Not Able T Medications ake Completely Able Use T elephone Completely Able Care for Appearance Completely Able Use T oilet Completely Able Bath / Shower Need Assistance Dress Self Completely Able Feed Self Completely Able Walk Completely Able Get In / Out Bed Completely Able Housework Completely Able Prepare Meals Completely [place] for Self Completely Able Electronic Signature(s) Signed: 02/22/2022 4:38:52 PM By: [name] RN Entered By: [name] on  02/22/2022 08:21:01 -------------------------------------------------------------------------------- Education Screening Details Patient Name: Date of Service: [name], [name] 02/22/2022 8:00 A M Medical Record Number: [id number] Patient Account Number: [id number] Date of Birth/Sex: Treating RN: March 02, 1956 (66 y.o. [name] Primary Care Teona Vargus: Sherrill Raring, Blenda Mounts Other Clinician: Referring Rodolfo Notaro: Treating Antavion Bartoszek/Extender: Fredirick Maudlin BRO WN, BEV ERLY Weeks in Treatment: 0 Primary Learner Assessed: Patient Learning Preferences/Education Level/Primary Language Learning Preference: Explanation, Demonstration, Printed Material Highest Education Level: College or Above Preferred Language: English Cognitive Barrier Language Barrier: No Translator Needed: No Memory Deficit: No Emotional Barrier: No Cultural/Religious Beliefs Affecting Medical Care: No Physical Barrier Impaired Vision: No Impaired Hearing: No Decreased Hand dexterity: No Knowledge/Comprehension Knowledge Level: High Comprehension Level: High Ability to understand written instructions: High Ability to understand verbal instructions: High Motivation Anxiety Level: Calm Cooperation: Cooperative Education Importance: Acknowledges Need Interest in Health Problems: Asks Questions Perception: Coherent Willingness to Engage in Self-Management High Activities: Readiness to Engage in Self-Management High Activities: Electronic Signature(s) Signed: 02/22/2022 4:38:52 PM By: [name] RN Entered By: [name] on 02/22/2022 08:21:38 -------------------------------------------------------------------------------- Fall Risk Assessment Details Patient Name: Date of Service: [name], [name] 02/22/2022 8:00 A M Medical Record Number: [id number] Patient Account Number: [id number] Date of Birth/Sex: Treating RN: 12-13-1955 (66 y.o. [name] Primary Care Ramyah Pankowski: Sherrill Raring, Blenda Mounts Other  Clinician: Referring Yesly Gerety: Treating Corey Caulfield/Extender: Fredirick Maudlin BRO WN, BEV ERLY Weeks in Treatment: 0 Fall Risk Assessment Items Have you had 2 or more falls in the last 12 monthso 0 No Have you had any fall that resulted in injury in the last 12 monthso 0 Yes FALLS RISK SCREEN History of falling - immediate or within 3 months 0 No Secondary diagnosis (Do you have 2 or more medical diagnoseso) 0 No Ambulatory aid None/bed rest/wheelchair/nurse 0 No Crutches/cane/walker 0 No Furniture 0 No Intravenous therapy Access/Saline/Heparin [employer]  0 No Gait/Transferring Normal/ bed rest/ wheelchair 0 No Weak (short steps with or without shuffle, stooped but able to lift head while walking, may seek 0 No support from furniture) Impaired (short steps with shuffle, may have difficulty arising from chair, head down, impaired 0 No balance) Mental Status Oriented to own ability 0 No Notes Pt. stated was hypoglycemic ( CBG 30), he fell out the [facility] and ended up in hospital Electronic Signature(s) Signed: 02/22/2022 4:38:52 PM By: [name] RN Entered By: [name] on 02/22/2022 08:23:33 -------------------------------------------------------------------------------- Foot Assessment Details Patient Name: Date of Service: [name], [name] 02/22/2022 8:00 A M Medical Record Number: [id number] Patient Account Number: [id number] Date of Birth/Sex: Treating RN: 05-May-1956 (66 y.o. [name] Primary Care Fronia Depass: Sherrill Raring, Blenda Mounts Other Clinician: Referring Humna Moorehouse: Treating Merna Baldi/Extender: Fredirick Maudlin BRO WN, BEV ERLY Weeks in Treatment: 0 Foot Assessment Items Site Locations + = Sensation present, - = Sensation absent, C = Callus, U = Ulcer R = Redness, W = Warmth, M = Maceration, PU = Pre-ulcerative lesion F = Fissure, S = Swelling, D = Dryness Assessment Right: Left: Other Deformity: No No Prior Foot Ulcer: No No Prior Amputation: No No Charcot Joint:  No No Ambulatory Status: Non-ambulatory Assistance Device: Wheelchair Gait: Administrator, arts) Signed: 02/22/2022 4:38:52 PM By: [name] RN Entered By: [name] on 02/22/2022 08:26:27 -------------------------------------------------------------------------------- Nutrition Risk Screening Details Patient Name: Date of Service: [name], [name] 02/22/2022 8:00 A M Medical Record Number: [id number] Patient Account Number: [id number] Date of Birth/Sex: Treating RN: 1956/02/08 (66 y.o. [name] Primary Care Kingstyn Deruiter: Sherrill Raring, Blenda Mounts Other Clinician: Referring Nikita Humble: Treating Elwyn Klosinski/Extender: Fredirick Maudlin BRO WN, BEV ERLY Weeks in Treatment: 0 Height (in): 72 Weight (lbs): 392 Body Mass Index (BMI): 53.2 Nutrition Risk Screening Items Score Screening NUTRITION RISK SCREEN: I have an illness or condition that made me change the kind and/or amount of food I eat 0 No I eat fewer than two meals per day 0 No I eat few fruits and vegetables, or milk products 0 No I have three or more drinks of beer, liquor or wine almost every day 0 No I have tooth or mouth problems that make it hard for me to eat 0 No I don't always have enough money to buy the food I need 0 No I eat alone most of the time 0 No I take three or more different prescribed or over-the-counter drugs a day 0 No Without wanting to, I have lost or gained 10 pounds in the last six months 0 No I am not always physically able to shop, cook and/or feed myself 0 No Nutrition Protocols Good Risk Protocol 0 No interventions needed Moderate Risk Protocol High Risk Proctocol Risk Level: Good Risk Score: 0 Electronic Signature(s) Signed: 02/22/2022 4:38:52 PM By: [name] RN Entered By: [name] on 02/22/2022 08:23:46

## 2022-02-23 ENCOUNTER — Other Ambulatory Visit (HOSPITAL_COMMUNITY): Payer: Self-pay

## 2022-02-25 ENCOUNTER — Other Ambulatory Visit (HOSPITAL_COMMUNITY): Payer: Self-pay | Admitting: Cardiology

## 2022-02-28 ENCOUNTER — Ambulatory Visit: Payer: Medicare Other | Admitting: Podiatry

## 2022-03-01 ENCOUNTER — Encounter (HOSPITAL_BASED_OUTPATIENT_CLINIC_OR_DEPARTMENT_OTHER): Payer: Medicare Other | Admitting: General Surgery

## 2022-03-01 ENCOUNTER — Other Ambulatory Visit (HOSPITAL_COMMUNITY): Payer: Self-pay | Admitting: Cardiology

## 2022-03-01 ENCOUNTER — Encounter (HOSPITAL_BASED_OUTPATIENT_CLINIC_OR_DEPARTMENT_OTHER): Payer: Self-pay

## 2022-03-09 ENCOUNTER — Encounter (HOSPITAL_BASED_OUTPATIENT_CLINIC_OR_DEPARTMENT_OTHER): Payer: Medicare Other | Admitting: General Surgery

## 2022-03-09 DIAGNOSIS — E11621 Type 2 diabetes mellitus with foot ulcer: Secondary | ICD-10-CM | POA: Diagnosis not present

## 2022-03-09 NOTE — Progress Notes (Signed)
[name], [name] ([id number]) Visit Report for 03/09/2022 Chief Complaint Document Details Patient Name: Date of Service: [name], [name] 03/09/2022 8:45 A M Medical Record Number: [id number] Patient Account Number: [id number] Date of Birth/Sex: Treating RN: 1956/01/28 (66 y.o. [name] Primary Care Provider: [name] Other Clinician: Referring Provider: Treating Provider/Extender: [name] in Treatment: 2 Information Obtained from: Patient Chief Complaint 11/09/2018; patient is here for review of wounds on his dorsal right first and second toes and on the dorsal left first toe. 02/22/2022: The patient is here for a new ulcer on his posterior left heel. Electronic Signature(s) Signed: 03/09/2022 9:26:15 AM By: Fredirick Maudlin MD FACS Entered By: Fredirick Maudlin on 03/09/2022 09:26:15 -------------------------------------------------------------------------------- Debridement Details Patient Name: Date of Service: [name], [name] 03/09/2022 8:45 A M Medical Record Number: [id number] Patient Account Number: [id number] Date of Birth/Sex: Treating RN: 02/04/1956 (66 y.o. [name] Primary Care Provider: [name] Other Clinician: Referring Provider: Treating Provider/Extender: [name] in Treatment: 2 Debridement Performed for Assessment: Wound #20 Left,Lateral Calcaneus Performed By: Physician Fredirick Maudlin, MD Debridement Type: Debridement Severity of Tissue Pre Debridement: Fat layer exposed Level of Consciousness (Pre-procedure): Awake and Alert Pre-procedure Verification/Time Out Yes - 08:30 Taken: Start Time: 08:30 T Area Debrided (L x W): otal 0.7 (cm) x 1.5 (cm) = 1.05 (cm) Tissue and other material debrided: Non-Viable, Callus, Slough, Subcutaneous, Slough Level: Skin/Subcutaneous Tissue Debridement Description: Excisional Instrument: Curette Bleeding: Minimum Hemostasis Achieved:  Pressure End Time: 08:31 Procedural Pain: 0 Post Procedural Pain: 0 Response to Treatment: Procedure was tolerated well Level of Consciousness (Post- Awake and Alert procedure): Post Debridement Measurements of Total Wound Length: (cm) 0.7 Width: (cm) 1.5 Depth: (cm) 0.4 Volume: (cm) 0.33 Character of Wound/Ulcer Post Debridement: Improved Severity of Tissue Post Debridement: Fat layer exposed Post Procedure Diagnosis Same as Pre-procedure Electronic Signature(s) Signed: 03/09/2022 10:18:58 AM By: Fredirick Maudlin MD FACS Signed: 03/09/2022 6:34:16 PM By: [name] RN Entered By: [name] on 03/09/2022 09:14:02 -------------------------------------------------------------------------------- HPI Details Patient Name: Date of Service: [name], [name] 03/09/2022 8:45 A M Medical Record Number: [id number] Patient Account Number: [id number] Date of Birth/Sex: Treating RN: 1956-02-21 (66 y.o. [name] Primary Care Provider: [name] Other Clinician: Referring Provider: Treating Provider/Extender: [name] in Treatment: 2 History of Present Illness HPI Description: READMISSION 11/09/2018 This is a now 66 year old man that we had in this clinic over a multitude of years but most recently in 2015. He had wounds on his plantar foot initially in 2007 and 2008 and I think subsequently was seen in 2012 then 2014 into the mid part of 2015 with wounds on his toes. He is a type II diabetic with peripheral neuropathy but no known PAD. The patient states he was doing well until about a month ago he noted an area on the left great toe which was superficial and then an area on the right great and second toes about a week later. He is not sure how this happened he simply noticed this when he was lying in bed at night. He has been using peroxide. He has not seen a doctor. He has not been on antibiotics and has had no x-rays. Past medical  history; type 2 diabetes with peripheral neuropathy, hypertension, hypothyroidism, low B12 levels, iron deficiency anemia, ventral hernia, diabetic nephropathy, chronic diastolic heart failure and obstructive sleep apnea. ABIs in this clinic were 1.2 on the right and 1.1 on the left 2/28; patient  arrived last week for areas on his left first right first and right second toes. X-ray of the right foot did not show any osseous abnormality. Culture of the right great toe showed Staphylococcus aureus methicillin sensitive and he is on dicloxacillin which he started 3 days ago. 3/6; patient with wounds on his right first and second toes and left first toes. The area over the left first toe is extensive we have been using silver alginate. Culture grew MSSA and he is on dicloxacillin which I prescribed on the 25th. He is still taking this which I am not really sure of the reason. He expressed knowledge that this was 4 times a day he should have been out of this around March 2 if he picked it up on the 25th or they gave him the right number of pills. Nevertheless our nurses report more purulent drainage and odor 3/17-Patient returns for the 3 wounds the left first toe and the right first and second toe dorsal surfaces. The left first toe is closed up and healed, the right first toe dorsal surface wound is extensive and appears the same as last time, no purulent drainage or odor reported per nurse, left second toe wound is slightly smaller. He has completed the dicloxacillin as of today. Noted that his culture previously had grown MSSA. We have been using calcium alginate to the wounds. X-rays have been reviewed. ABIs were reviewed as well. 3/27; patient returns for review of wounds on his right first and right second toes. The wound on the left first toe is healed. 4/10; 2-week follow-up. The area on the right second toe dorsally is healed. Per the patient the left first toe remains healed. He still has an  extensive area dorsally over the right first toe inner phalangeal joint area. Rolled up hyper granulated tissue with probably a nonviable surface 4/24; 2-week follow-up. The areas on the right second toe dorsally and right first toe dorsally is healed. The area on the dorsal left first toe over the inner phalangeal joint is smaller but still hyper granulated we have been using Hydrofera Blue 5/15; 3-week follow-up. The areas on the right first toe. Not the left as I said on 4/24. He is using Hydrofera Blue. 5/29; 3-week follow-up. The area on his dorsal right toe over the right inter phalangeal joint is just about closed. We have been using Hydrofera Blue. In passing he showed me his right thumb which is exquisitely tender over the tip of the digit. 6/5; the area on the dorsal right toe over the inner phalangeal joint is closed. Right thumb is better which I gave him antibiotics for last week. Still some tenderness but considerably improved READMISSION 02/22/2022 This is a 66 year old type II diabetic with congestive heart failure, end-stage renal disease on hemodialysis, peripheral artery disease, and morbid obesity. He has developed an ulcer on his left posterior heel that he believes is secondary to his health assistance not adequately moisturizing his feet. He says it first appeared about 2 weeks ago. He does not have sensation in his feet. He has not noticed any foul odor or drainage. His last hemoglobin A1c was 7.9 at the beginning of January. ABI in clinic today was 1.15. 03/09/2022: He has a new wound on his abdomen that he says started out as a lump and then the skin broke open when he bumped it on a portion of his hospital bed at home. There is fat exposed. The wound on his heel is a little  bit smaller but still has a very pale surface. There is periwound callus accumulation. Electronic Signature(s) Signed: 03/09/2022 9:27:14 AM By: Fredirick Maudlin MD FACS Entered By: Fredirick Maudlin on  03/09/2022 09:27:14 -------------------------------------------------------------------------------- Chemical Cauterization Details Patient Name: Date of Service: [name], [name] 03/09/2022 8:45 A M Medical Record Number: [id number] Patient Account Number: [id number] Date of Birth/Sex: Treating RN: 30-Nov-1955 (66 y.o. [name] Primary Care Provider: [name] Other Clinician: Referring Provider: Treating Provider/Extender: [name] in Treatment: 2 Procedure Performed for: Wound #21 Right Abdomen - midline Performed By: Physician Fredirick Maudlin, MD Post Procedure Diagnosis Same as Pre-procedure Electronic Signature(s) Signed: 03/09/2022 10:18:58 AM By: Fredirick Maudlin MD FACS Signed: 03/09/2022 6:34:16 PM By: [name] RN Entered By: [name] on 03/09/2022 09:14:20 -------------------------------------------------------------------------------- Physical Exam Details Patient Name: Date of Service: [name], [name] 03/09/2022 8:45 A M Medical Record Number: [id number] Patient Account Number: [id number] Date of Birth/Sex: Treating RN: Oct 18, 1955 (66 y.o. [name] Primary Care Provider: [name] Other Clinician: Referring Provider: Treating Provider/Extender: [name] in Treatment: 2 Constitutional . . . . No acute distress.Marland Kitchen Respiratory Normal work of breathing on room air.. Notes 03/09/2022: He has a new wound on his abdomen that he says started out as a lump and then the skin broke open when he bumped it on a portion of his hospital bed at home. There is fat exposed. The wound on his heel is a little bit smaller but still has a very pale surface. There is periwound callus accumulation. No odor noted today. Electronic Signature(s) Signed: 03/09/2022 9:29:35 AM By: Fredirick Maudlin MD FACS Entered By: Fredirick Maudlin on 03/09/2022  09:29:35 -------------------------------------------------------------------------------- Physician Orders Details Patient Name: Date of Service: [name], [name] 03/09/2022 8:45 A M Medical Record Number: [id number] Patient Account Number: [id number] Date of Birth/Sex: Treating RN: 1955/10/25 (66 y.o. [name] Primary Care Provider: Other Clinician: [name] Referring Provider: Treating Provider/Extender: [name] in Treatment: 2 Verbal / Phone Orders: No Diagnosis Coding ICD-10 Coding Code Description [phone] Non-pressure chronic ulcer of left heel and midfoot with fat layer exposed E11.621 Type 2 diabetes mellitus with foot ulcer N18.6 End stage renal disease I50.9 Heart failure, unspecified I10 Essential (primary) hypertension I73.9 Peripheral vascular disease, unspecified L98.492 Non-pressure chronic ulcer of skin of other sites with fat layer exposed Follow-up Appointments ppointment in 2 weeks. - Dr Celine Ahr Room 3 Wednesday July 5th at 9:30am Return A Licensed conveyancer Other Bathing/Shower/Hygiene Orders/Instructions: - change wound dressing after bathing Off-Loading Wound #20 Left,Lateral Calcaneus Other: - When in bed place a couple of pillows behind the left calf to elevate heel off the bed Harts please change wound dressing 2 times a week. Wound Treatment Wound #20 - Calcaneus Wound Laterality: Left, Lateral Cleanser: Soap and Water 3 x Per Week/30 Days Discharge Instructions: May shower and wash wound with dial antibacterial soap and water prior to dressing change. Cleanser: Wound Cleanser 3 x Per Week/30 Days Discharge Instructions: Cleanse the wound with wound cleanser prior to applying a clean dressing using gauze sponges, not tissue or cotton balls. Topical: Gentamicin 3 x Per Week/30 Days Discharge Instructions: As directed by physician Prim Dressing:  Hydrofera Blue Classic Foam, 2x2 in 3 x Per Week/30 Days ary Discharge Instructions: Moisten with saline prior to applying to wound bed Secondary Dressing: Woven Gauze Sponge, Non-Sterile 4x4 in 3 x Per Week/30 Days Discharge Instructions: Apply  over primary dressing as directed. Secured With: Elastic Bandage 4 inch (ACE bandage) 3 x Per Week/30 Days Discharge Instructions: Secure with ACE bandage as directed. Secured With: [employer], 4.5x3.1 (in/yd) 3 x Per Week/30 Days Discharge Instructions: Secure with Kerlix as directed. Secured With: Paper Tape, 2x10 (in/yd) 3 x Per Week/30 Days Discharge Instructions: Secure dressing with tape as directed. Wound #21 - Abdomen - midline Wound Laterality: Right Cleanser: Soap and Water 3 x Per Week/30 Days Discharge Instructions: May shower and wash wound with dial antibacterial soap and water prior to dressing change. Cleanser: Wound Cleanser 3 x Per Week/30 Days Discharge Instructions: Cleanse the wound with wound cleanser prior to applying a clean dressing using gauze sponges, not tissue or cotton balls. Prim Dressing: Hydrofera Blue Classic Foam, 2x2 in 3 x Per Week/30 Days ary Discharge Instructions: Moisten with saline prior to applying to wound bed Secondary Dressing: Bordered Gauze, 2x2 in 3 x Per Week/30 Days Discharge Instructions: Apply over primary dressing as directed. Secondary Dressing: Woven Gauze Sponge, Non-Sterile 4x4 in 3 x Per Week/30 Days Discharge Instructions: Apply over primary dressing as directed. Secured With: Paper Tape, 2x10 (in/yd) 3 x Per Week/30 Days Discharge Instructions: Secure dressing with tape as directed. Electronic Signature(s) Signed: 03/09/2022 10:18:58 AM By: Fredirick Maudlin MD FACS Entered By: Fredirick Maudlin on 03/09/2022 09:29:55 -------------------------------------------------------------------------------- Problem List Details Patient Name: Date of Service: [name], [name] 03/09/2022  8:45 A M Medical Record Number: [id number] Patient Account Number: [id number] Date of Birth/Sex: Treating RN: [birth year] (66 y.o. [name] Primary Care Provider: [name] Other Clinician: Referring Provider: Treating Provider/Extender: [name] in Treatment: 2 Active Problems ICD-10 Encounter Code Description Active Date MDM Diagnosis [phone] Non-pressure chronic ulcer of left heel and midfoot with fat layer exposed 02/22/2022 No Yes E11.621 Type 2 diabetes mellitus with foot ulcer 02/22/2022 No Yes N18.6 End stage renal disease 02/22/2022 No Yes I50.9 Heart failure, unspecified 02/22/2022 No Yes I10 Essential (primary) hypertension 02/22/2022 No Yes I73.9 Peripheral vascular disease, unspecified 02/22/2022 No Yes L98.492 Non-pressure chronic ulcer of skin of other sites with fat layer exposed 03/09/2022 No Yes Inactive Problems Resolved Problems Electronic Signature(s) Signed: 03/09/2022 9:25:57 AM By: Fredirick Maudlin MD FACS Entered By: Fredirick Maudlin on 03/09/2022 09:25:57 -------------------------------------------------------------------------------- Progress Note Details Patient Name: Date of Service: [name], [name] 03/09/2022 8:45 A M Medical Record Number: [id number] Patient Account Number: [id number] Date of Birth/Sex: Treating RN: 1956-07-22 (66 y.o. [name] Primary Care Provider: [name] Other Clinician: Referring Provider: Treating Provider/Extender: [name] in Treatment: 2 Subjective Chief Complaint Information obtained from Patient 11/09/2018; patient is here for review of wounds on his dorsal right first and second toes and on the dorsal left first toe. 02/22/2022: The patient is here for a new ulcer on his posterior left heel. History of Present Illness (HPI) READMISSION 11/09/2018 This is a now 66 year old man that we had in this clinic over a multitude of years but most recently  in 2015. He had wounds on his plantar foot initially in 2007 and 2008 and I think subsequently was seen in 2012 then 2014 into the mid part of 2015 with wounds on his toes. He is a type II diabetic with peripheral neuropathy but no known PAD. The patient states he was doing well until about a month ago he noted an area on the left great toe which was superficial and then an area on the right great and second toes  about a week later. He is not sure how this happened he simply noticed this when he was lying in bed at night. He has been using peroxide. He has not seen a doctor. He has not been on antibiotics and has had no x-rays. Past medical history; type 2 diabetes with peripheral neuropathy, hypertension, hypothyroidism, low B12 levels, iron deficiency anemia, ventral hernia, diabetic nephropathy, chronic diastolic heart failure and obstructive sleep apnea. ABIs in this clinic were 1.2 on the right and 1.1 on the left 2/28; patient arrived last week for areas on his left first right first and right second toes. X-ray of the right foot did not show any osseous abnormality. Culture of the right great toe showed Staphylococcus aureus methicillin sensitive and he is on dicloxacillin which he started 3 days ago. 3/6; patient with wounds on his right first and second toes and left first toes. The area over the left first toe is extensive we have been using silver alginate. Culture grew MSSA and he is on dicloxacillin which I prescribed on the 25th. He is still taking this which I am not really sure of the reason. He expressed knowledge that this was 4 times a day he should have been out of this around March 2 if he picked it up on the 25th or they gave him the right number of pills. Nevertheless our nurses report more purulent drainage and odor 3/17-Patient returns for the 3 wounds the left first toe and the right first and second toe dorsal surfaces. The left first toe is closed up and healed, the  right first toe dorsal surface wound is extensive and appears the same as last time, no purulent drainage or odor reported per nurse, left second toe wound is slightly smaller. He has completed the dicloxacillin as of today. Noted that his culture previously had grown MSSA. We have been using calcium alginate to the wounds. X-rays have been reviewed. ABIs were reviewed as well. 3/27; patient returns for review of wounds on his right first and right second toes. The wound on the left first toe is healed. 4/10; 2-week follow-up. The area on the right second toe dorsally is healed. Per the patient the left first toe remains healed. He still has an extensive area dorsally over the right first toe inner phalangeal joint area. Rolled up hyper granulated tissue with probably a nonviable surface 4/24; 2-week follow-up. The areas on the right second toe dorsally and right first toe dorsally is healed. The area on the dorsal left first toe over the inner phalangeal joint is smaller but still hyper granulated we have been using Hydrofera Blue 5/15; 3-week follow-up. The areas on the right first toe. Not the left as I said on 4/24. He is using Hydrofera Blue. 5/29; 3-week follow-up. The area on his dorsal right toe over the right inter phalangeal joint is just about closed. We have been using Hydrofera Blue. In passing he showed me his right thumb which is exquisitely tender over the tip of the digit. 6/5; the area on the dorsal right toe over the inner phalangeal joint is closed. Right thumb is better which I gave him antibiotics for last week. Still some tenderness but considerably improved READMISSION 02/22/2022 This is a 66 year old type II diabetic with congestive heart failure, end-stage renal disease on hemodialysis, peripheral artery disease, and morbid obesity. He has developed an ulcer on his left posterior heel that he believes is secondary to his health assistance not adequately moisturizing his  feet.  He says it first appeared about 2 weeks ago. He does not have sensation in his feet. He has not noticed any foul odor or drainage. His last hemoglobin A1c was 7.9 at the beginning of January. ABI in clinic today was 1.15. 03/09/2022: He has a new wound on his abdomen that he says started out as a lump and then the skin broke open when he bumped it on a portion of his hospital bed at home. There is fat exposed. The wound on his heel is a little bit smaller but still has a very pale surface. There is periwound callus accumulation. Patient History Information obtained from Patient. Family History Cancer - Mother,Father, Diabetes - Mother,Father,Siblings, Heart Disease - Siblings, Lung Disease - Mother, No family history of Hereditary Spherocytosis, Hypertension, Kidney Disease, Seizures, Stroke, Thyroid Problems, Tuberculosis. Social History Never smoker, Marital Status - Single, Alcohol Use - Never, Drug Use - No History, Caffeine Use - Daily - Coffee. Medical History Eyes Denies history of Cataracts, Glaucoma, Optic Neuritis Ear/Nose/Mouth/Throat Denies history of Chronic sinus problems/congestion, Middle ear problems Hematologic/Lymphatic Denies history of Anemia, Hemophilia, Human Immunodeficiency Virus, Lymphedema, Sickle Cell Disease Respiratory Patient has history of Sleep Apnea Denies history of Aspiration, Asthma, Chronic Obstructive Pulmonary Disease (COPD), Pneumothorax, Tuberculosis Cardiovascular Patient has history of Congestive Heart Failure, Hypertension Denies history of Angina, Arrhythmia, Coronary Artery Disease, Deep Vein Thrombosis, Hypotension, Myocardial Infarction, Peripheral Arterial Disease, Peripheral Venous Disease, Phlebitis, Vasculitis Gastrointestinal Denies history of Cirrhosis , Colitis, Crohnoos, Hepatitis A, Hepatitis B, Hepatitis C Endocrine Patient has history of Type II Diabetes Denies history of Type I Diabetes Genitourinary Denies history of  End Stage Renal Disease Immunological Denies history of Lupus Erythematosus, Raynaudoos, Scleroderma Integumentary (Skin) Denies history of History of Burn Musculoskeletal Patient has history of Osteoarthritis Denies history of Gout, Rheumatoid Arthritis, Osteomyelitis Neurologic Patient has history of Neuropathy - Diabetic Denies history of Dementia, Quadriplegia, Paraplegia, Seizure Disorder Oncologic Denies history of Received Chemotherapy, Received Radiation Psychiatric Denies history of Anorexia/bulimia, Confinement Anxiety Objective Constitutional No acute distress.. Vitals Time Taken: 8:53 AM, Height: 72 in, Weight: 392 lbs, BMI: 53.2, Temperature: 98.3 F, Pulse: 66 bpm, Respiratory Rate: 20 breaths/min, Blood Pressure: 136/85 mmHg. Respiratory Normal work of breathing on room air.. General Notes: 03/09/2022: He has a new wound on his abdomen that he says started out as a lump and then the skin broke open when he bumped it on a portion of his hospital bed at home. There is fat exposed. The wound on his heel is a little bit smaller but still has a very pale surface. There is periwound callus accumulation. No odor noted today. Integumentary (Hair, Skin) Wound #20 status is Open. Original cause of wound was Gradually Appeared. The date acquired was: 02/08/2022. The wound has been in treatment 2 weeks. The wound is located on the Left,Lateral Calcaneus. The wound measures 0.7cm length x 1.5cm width x 0.4cm depth; 0.825cm^2 area and 0.33cm^3 volume. There is Fat Layer (Subcutaneous Tissue) exposed. There is no tunneling or undermining noted. There is a medium amount of serosanguineous drainage noted. There is large (67-100%) pink, pale granulation within the wound bed. There is a small (1-33%) amount of necrotic tissue within the wound bed including Adherent Slough. Wound #21 status is Open. Original cause of wound was Blister. The date acquired was: 03/03/2022. The wound is located  on the Right Abdomen - midline. The wound measures 0.3cm length x 0.6cm width x 0.1cm depth; 0.141cm^2 area and 0.014cm^3 volume. There is Fat  Layer (Subcutaneous Tissue) exposed. There is no tunneling or undermining noted. There is a medium amount of serosanguineous drainage noted. There is large (67-100%) pink, pale granulation within the wound bed. There is no necrotic tissue within the wound bed. Assessment Active Problems ICD-10 Non-pressure chronic ulcer of left heel and midfoot with fat layer exposed Type 2 diabetes mellitus with foot ulcer End stage renal disease Heart failure, unspecified Essential (primary) hypertension Peripheral vascular disease, unspecified Non-pressure chronic ulcer of skin of other sites with fat layer exposed Procedures Wound #20 Pre-procedure diagnosis of Wound #20 is a Diabetic Wound/Ulcer of the Lower Extremity located on the Left,Lateral Calcaneus .Severity of Tissue Pre Debridement is: Fat layer exposed. There was a Excisional Skin/Subcutaneous Tissue Debridement with a total area of 1.05 sq cm performed by Fredirick Maudlin, MD. With the following instrument(s): Curette to remove Non-Viable tissue/material. Material removed includes Callus, Subcutaneous Tissue, and Slough. No specimens were taken. A time out was conducted at 08:30, prior to the start of the procedure. A Minimum amount of bleeding was controlled with Pressure. The procedure was tolerated well with a pain level of 0 throughout and a pain level of 0 following the procedure. Post Debridement Measurements: 0.7cm length x 1.5cm width x 0.4cm depth; 0.33cm^3 volume. Character of Wound/Ulcer Post Debridement is improved. Severity of Tissue Post Debridement is: Fat layer exposed. Post procedure Diagnosis Wound #20: Same as Pre-Procedure Wound #21 Pre-procedure diagnosis of Wound #21 is a Lesion located on the Right Abdomen - midline . An Chemical Cauterization procedure was performed by  Fredirick Maudlin, MD. Post procedure Diagnosis Wound #21: Same as Pre-Procedure Plan Follow-up Appointments: Return Appointment in 2 weeks. - Dr Celine Ahr Room 3 Wednesday July 5th at 9:30am Bathing/ Shower/ Hygiene: Other Bathing/Shower/Hygiene Orders/Instructions: - change wound dressing after bathing Off-Loading: Wound #20 Left,Lateral Calcaneus: Other: - When in bed place a couple of pillows behind the left calf to elevate heel off the bed Home Health: Other Home Health Orders/Instructions: - Home Health please change wound dressing 2 times a week. WOUND #20: - Calcaneus Wound Laterality: Left, Lateral Cleanser: Soap and Water 3 x Per Week/30 Days Discharge Instructions: May shower and wash wound with dial antibacterial soap and water prior to dressing change. Cleanser: Wound Cleanser 3 x Per Week/30 Days Discharge Instructions: Cleanse the wound with wound cleanser prior to applying a clean dressing using gauze sponges, not tissue or cotton balls. Topical: Gentamicin 3 x Per Week/30 Days Discharge Instructions: As directed by physician Prim Dressing: Hydrofera Blue Classic Foam, 2x2 in 3 x Per Week/30 Days ary Discharge Instructions: Moisten with saline prior to applying to wound bed Secondary Dressing: Woven Gauze Sponge, Non-Sterile 4x4 in 3 x Per Week/30 Days Discharge Instructions: Apply over primary dressing as directed. Secured With: Elastic Bandage 4 inch (ACE bandage) 3 x Per Week/30 Days Discharge Instructions: Secure with ACE bandage as directed. Secured With: [employer], 4.5x3.1 (in/yd) 3 x Per Week/30 Days Discharge Instructions: Secure with Kerlix as directed. Secured With: Paper T ape, 2x10 (in/yd) 3 x Per Week/30 Days Discharge Instructions: Secure dressing with tape as directed. WOUND #21: - Abdomen - midline Wound Laterality: Right Cleanser: Soap and Water 3 x Per Week/30 Days Discharge Instructions: May shower and wash wound with dial antibacterial soap  and water prior to dressing change. Cleanser: Wound Cleanser 3 x Per Week/30 Days Discharge Instructions: Cleanse the wound with wound cleanser prior to applying a clean dressing using gauze sponges, not tissue or cotton balls. Prim  Dressing: Hydrofera Blue Classic Foam, 2x2 in 3 x Per Week/30 Days ary Discharge Instructions: Moisten with saline prior to applying to wound bed Secondary Dressing: Bordered Gauze, 2x2 in 3 x Per Week/30 Days Discharge Instructions: Apply over primary dressing as directed. Secondary Dressing: Woven Gauze Sponge, Non-Sterile 4x4 in 3 x Per Week/30 Days Discharge Instructions: Apply over primary dressing as directed. Secured With: Paper T ape, 2x10 (in/yd) 3 x Per Week/30 Days Discharge Instructions: Secure dressing with tape as directed. 03/09/2022: He has a new wound on his abdomen that he says started out as a lump and then the skin broke open when he bumped it on a portion of his hospital bed at home. There is fat exposed. The wound on his heel is a little bit smaller but still has a very pale surface. There is periwound callus accumulation. No odor noted today. The exposed tissue on his abdominal wound looked somewhat hypertrophic so I chemically cauterized this with silver nitrate. I used a curette to debride slough, subcutaneous tissue, and periwound callus from the heel ulcer. We will continue applying gentamicin and Hydrofera Blue to the heel; Hydrofera Blue alone to the new abdominal wound. Follow-up in 2 weeks. Electronic Signature(s) Signed: 03/09/2022 9:30:42 AM By: Fredirick Maudlin MD FACS Entered By: Fredirick Maudlin on 03/09/2022 09:30:42 -------------------------------------------------------------------------------- HxROS Details Patient Name: Date of Service: [name], [name] 03/09/2022 8:45 A M Medical Record Number: [id number] Patient Account Number: [id number] Date of Birth/Sex: Treating RN: 03/29/1956 (66 y.o. [name] Primary Care  Provider: [name] Other Clinician: Referring Provider: Treating Provider/Extender: [name] in Treatment: 2 Information Obtained From Patient Eyes Medical History: Negative for: Cataracts; Glaucoma; Optic Neuritis Ear/Nose/Mouth/Throat Medical History: Negative for: Chronic sinus problems/congestion; Middle ear problems Hematologic/Lymphatic Medical History: Negative for: Anemia; Hemophilia; Human Immunodeficiency Virus; Lymphedema; Sickle Cell Disease Respiratory Medical History: Positive for: Sleep Apnea Negative for: Aspiration; Asthma; Chronic Obstructive Pulmonary Disease (COPD); Pneumothorax; Tuberculosis Cardiovascular Medical History: Positive for: Congestive Heart Failure; Hypertension Negative for: Angina; Arrhythmia; Coronary Artery Disease; Deep Vein Thrombosis; Hypotension; Myocardial Infarction; Peripheral Arterial Disease; Peripheral Venous Disease; Phlebitis; Vasculitis Gastrointestinal Medical History: Negative for: Cirrhosis ; Colitis; Crohns; Hepatitis A; Hepatitis B; Hepatitis C Endocrine Medical History: Positive for: Type II Diabetes Negative for: Type I Diabetes Time with diabetes: 1994 Treated with: Insulin Blood sugar tested every day: No Genitourinary Medical History: Negative for: End Stage Renal Disease Immunological Medical History: Negative for: Lupus Erythematosus; Raynauds; Scleroderma Integumentary (Skin) Medical History: Negative for: History of Burn Musculoskeletal Medical History: Positive for: Osteoarthritis Negative for: Gout; Rheumatoid Arthritis; Osteomyelitis Neurologic Medical History: Positive for: Neuropathy - Diabetic Negative for: Dementia; Quadriplegia; Paraplegia; Seizure Disorder Oncologic Medical History: Negative for: Received Chemotherapy; Received Radiation Psychiatric Medical History: Negative for: Anorexia/bulimia; Confinement Anxiety Immunizations Pneumococcal  Vaccine: Received Pneumococcal Vaccination: No Implantable Devices No devices added Family and Social History Cancer: Yes - Mother,Father; Diabetes: Yes - Mother,Father,Siblings; Heart Disease: Yes - Siblings; Hereditary Spherocytosis: No; Hypertension: No; Kidney Disease: No; Lung Disease: Yes - Mother; Seizures: No; Stroke: No; Thyroid Problems: No; Tuberculosis: No; Never smoker; Marital Status - Single; Alcohol Use: Never; Drug Use: No History; Caffeine Use: Daily - Coffee; Financial Concerns: No; Food, Clothing or Shelter Needs: No; Support System Lacking: No; Transportation Concerns: No Electronic Signature(s) Signed: 03/09/2022 10:18:58 AM By: Fredirick Maudlin MD FACS Signed: 03/09/2022 6:34:16 PM By: [name] RN Entered By: Fredirick Maudlin on 03/09/2022 09:27:20 -------------------------------------------------------------------------------- SuperBill Details Patient Name: Date of Service: [name], [name]  03/09/2022 Medical Record Number: [id number] Patient Account Number: [id number] Date of Birth/Sex: Treating RN: 1955-10-29 (66 y.o. [name] Primary Care Provider: [name] Other Clinician: Referring Provider: Treating Provider/Extender: [name] in Treatment: 2 Diagnosis Coding ICD-10 Codes Code Description [phone] Non-pressure chronic ulcer of left heel and midfoot with fat layer exposed E11.621 Type 2 diabetes mellitus with foot ulcer N18.6 End stage renal disease I50.9 Heart failure, unspecified I10 Essential (primary) hypertension I73.9 Peripheral vascular disease, unspecified L98.492 Non-pressure chronic ulcer of skin of other sites with fat layer exposed Facility Procedures CPT4 Code: [id number] 1 I Description: [place] - DEB SUBQ TISSUE 20 SQ CM/< ICD-10 Diagnosis Description L97.422 Non-pressure chronic ulcer of left heel and midfoot with fat layer exposed 7250 - CHEM CAUT GRANULATION TISS CD-10 Diagnosis  Description L98.492 Non-pressure chronic  ulcer of skin of other sites with fat layer exposed Modifier: 1 Quantity: 1 Physician Procedures : CPT4 Code Description Modifier [id number] - WC PHYS LEVEL 3 - EST PT 25 ICD-10 Diagnosis Description L97.422 Non-pressure chronic ulcer of left heel and midfoot with fat layer exposed L98.492 Non-pressure chronic ulcer of skin of other sites with  fat layer exposed E11.621 Type 2 diabetes mellitus with foot ulcer I50.9 Heart failure, unspecified Quantity: 1 : [id number] - WC PHYS SUBQ TISS 20 SQ CM ICD-10 Diagnosis Description L97.422 Non-pressure chronic ulcer of left heel and midfoot with fat layer exposed Quantity: 1 : [id number] - WC PHYS CHEM CAUT GRAN TISSUE ICD-10 Diagnosis Description L98.492 Non-pressure chronic ulcer of skin of other sites with fat layer exposed Quantity: 1 Electronic Signature(s) Signed: 03/09/2022 9:31:18 AM By: Fredirick Maudlin MD FACS Entered By: Fredirick Maudlin on 03/09/2022 09:31:17

## 2022-03-09 NOTE — Progress Notes (Signed)
[name] ([id number]) Visit Report for 03/09/2022 Arrival Information Details Patient Name: Date of Service: [name], [name] 03/09/2022 8:45 A M Medical Record Number: [id number] Patient Account Number: [id number] Date of Birth/Sex: Treating RN: 12-18-1955 (66 y.o. [name], [name] Primary Care [name]: [name] Other Clinician: Referring [name]: Treating [name]/Extender: [name] in Treatment: 2 Visit Information History Since Last Visit Added or deleted any medications: No Patient Arrived: Wheel Chair Any new allergies or adverse reactions: No Arrival Time: 08:49 Had a fall or experienced change in No Accompanied By: self activities of daily living that may affect Transfer Assistance: Manual risk of falls: Patient Identification Verified: Yes Signs or symptoms of abuse/neglect since last visito No Secondary Verification Process Completed: Yes Hospitalized since last visit: No Patient Requires Transmission-Based Precautions: No Implantable device outside of the clinic excluding No Patient Has Alerts: No cellular tissue based products placed in the center since last visit: Has Dressing in Place as Prescribed: Yes Pain Present Now: Yes Electronic Signature(s) Signed: 03/09/2022 4:06:51 PM By: [name] RN Entered By: [name] on 03/09/2022 08:50:03 -------------------------------------------------------------------------------- Encounter Discharge Information Details Patient Name: Date of Service: [name], [name] 03/09/2022 8:45 A M Medical Record Number: [id number] Patient Account Number: [id number] Date of Birth/Sex: Treating RN: 09-23-1955 (66 y.o. [name] Primary Care [name]: [name] Other Clinician: Referring Eleasha Cataldo: Treating [name]/Extender: [name] in Treatment: 2 Encounter Discharge Information Items Post Procedure Vitals Discharge Condition:  Stable Temperature (F): 98.3 Ambulatory Status: Wheelchair Pulse (bpm): 66 Discharge Destination: Home Respiratory Rate (breaths/min): 20 Transportation: Private Auto Blood Pressure (mmHg): 136/85 Accompanied By: self Schedule Follow-up Appointment: Yes Clinical Summary of Care: Patient Declined Electronic Signature(s) Signed: 03/09/2022 6:34:16 PM By: [name] RN Entered By: [name] on 03/09/2022 18:33:35 -------------------------------------------------------------------------------- Lower Extremity Assessment Details Patient Name: Date of Service: [name], [name] 03/09/2022 8:45 A M Medical Record Number: [id number] Patient Account Number: [id number] Date of Birth/Sex: Treating RN: [birth year] (66 y.o. [name] Primary Care [name]: [name] Other Clinician: Referring [name]: Treating [name]/Extender: [name] in Treatment: 2 Edema Assessment Assessed: [Left: No] [Right: No] Edema: [Left: Ye] [Right: s] Calf Left: Right: Point of Measurement: 37 cm From Medial Instep 52 cm Ankle Left: Right: Point of Measurement: 14 cm From Medial Instep 26.8 cm Electronic Signature(s) Signed: 03/09/2022 6:34:16 PM By: [name] RN Entered By: [name] on 03/09/2022 08:57:47 -------------------------------------------------------------------------------- Multi Wound Chart Details Patient Name: Date of Service: [name], [name] 03/09/2022 8:45 A M Medical Record Number: [id number] Patient Account Number: [id number] Date of Birth/Sex: Treating RN: 12-02-1955 (66 y.o. [name] Primary Care [name]: [name] Other Clinician: Referring [name]: Treating [name]/Extender: [name] in Treatment: 2 Vital Signs Height(in): 72 Pulse(bpm): 29 Weight(lbs): 392 Blood Pressure(mmHg): 136/85 Body Mass Index(BMI): 53.2 Temperature(F): 98.3 Respiratory  Rate(breaths/min): 20 Photos: [N/A:N/A] Left, Lateral Calcaneus Right Abdomen - midline N/A Wound Location: Gradually Appeared Blister N/A Wounding Event: Diabetic Wound/Ulcer of the Lower Lesion N/A Primary Etiology: Extremity Sleep Apnea, Congestive Heart Sleep Apnea, Congestive Heart N/A Comorbid History: Failure, Hypertension, Type II Failure, Hypertension, Type II Diabetes, Osteoarthritis, Neuropathy Diabetes, Osteoarthritis, Neuropathy 02/08/2022 03/03/2022 N/A Date Acquired: 2 0 N/A Weeks of Treatment: Open Open N/A Wound Status: No No N/A Wound Recurrence: 0.7x1.5x0.4 0.3x0.6x0.1 N/A Measurements L x W x D (cm) 0.825 0.141 N/A A (cm) : rea 0.33 0.014 N/A Volume (cm) : 31.80% 0.00% N/A % Reduction in Area: 31.80% 0.00%  N/A % Reduction in Volume: Grade 2 Full Thickness Without Exposed N/A Classification: Support Structures Medium Medium N/A Exudate A mount: Serosanguineous Serosanguineous N/A Exudate Type: red, brown red, brown N/A Exudate Color: Large (67-100%) Large (67-100%) N/A Granulation A mount: Pink, Pale Pink, Pale N/A Granulation Quality: Small (1-33%) None Present (0%) N/A Necrotic A mount: Fat Layer (Subcutaneous Tissue): Yes Fat Layer (Subcutaneous Tissue): Yes N/A Exposed Structures: Fascia: No Fascia: No Tendon: No Tendon: No Muscle: No Muscle: No Joint: No Joint: No Bone: No Bone: No None Small (1-33%) N/A Epithelialization: Debridement - Excisional N/A N/A Debridement: Pre-procedure Verification/Time Out 08:30 N/A N/A Taken: Callus, Subcutaneous, Slough N/A N/A Tissue Debrided: Skin/Subcutaneous Tissue N/A N/A Level: 1.05 N/A N/A Debridement A (sq cm): rea Curette N/A N/A Instrument: Minimum N/A N/A Bleeding: Pressure N/A N/A Hemostasis A chieved: 0 N/A N/A Procedural Pain: 0 N/A N/A Post Procedural Pain: Procedure was tolerated well N/A N/A Debridement Treatment Response: 0.7x1.5x0.4 N/A N/A Post Debridement  Measurements L x W x D (cm) 0.33 N/A N/A Post Debridement Volume: (cm) Debridement Chemical Cauterization N/A Procedures Performed: Treatment Notes Electronic Signature(s) Signed: 03/09/2022 9:26:06 AM By: Fredirick Maudlin MD FACS Signed: 03/09/2022 6:34:16 PM By: [name] RN Entered By: Fredirick Maudlin on 03/09/2022 09:26:06 -------------------------------------------------------------------------------- Multi-Disciplinary Care Plan Details Patient Name: Date of Service: [name], [name] 03/09/2022 8:45 A M Medical Record Number: [id number] Patient Account Number: [id number] Date of Birth/Sex: Treating RN: Jun 11, 1956 (66 y.o. [name] Primary Care Larnell Granlund: [name] Other Clinician: Referring Amarisa Wilinski: Treating Estephani Popper/Extender: [name] in Treatment: 2 Active Inactive Wound/Skin Impairment Nursing Diagnoses: Impaired tissue integrity Goals: Patient/caregiver will verbalize understanding of skin care regimen Date Initiated: 02/22/2022 Target Resolution Date: 03/24/2022 Goal Status: Active Interventions: Assess ulceration(s) every visit Treatment Activities: Skin care regimen initiated : 02/22/2022 Notes: Electronic Signature(s) Signed: 03/09/2022 6:34:16 PM By: [name] RN Entered By: [name] on 03/09/2022 09:08:10 -------------------------------------------------------------------------------- Pain Assessment Details Patient Name: Date of Service: [name], [name] 03/09/2022 8:45 A M Medical Record Number: [id number] Patient Account Number: [id number] Date of Birth/Sex: Treating RN: 02-01-1956 (66 y.o. Erie Noe Primary Care Alette Kataoka: [name] Other Clinician: Referring Keaden Gunnoe: Treating Vikash Nest/Extender: [name] in Treatment: 2 Active Problems Location of Pain Severity and Description of Pain Patient Has Paino Yes Site Locations Pain Location: Pain in  Ulcers With Dressing Change: Yes Duration of the Pain. Constant / Intermittento Intermittent Rate the pain. Current Pain Level: 7 Worst Pain Level: 10 Least Pain Level: 0 Tolerable Pain Level: 7 Character of Pain Describe the Pain: Aching Pain Management and Medication Current Pain Management: Medication: No Cold Application: No Rest: No Massage: No Activity: No T.E.N.S.: No Heat Application: No Leg drop or elevation: No Is the Current Pain Management Adequate: Adequate How does your wound impact your activities of daily livingo Sleep: No Bathing: No Appetite: No Relationship With Others: No Bladder Continence: No Emotions: No Bowel Continence: No Work: No Toileting: No Drive: No Dressing: No Hobbies: No Electronic Signature(s) Signed: 03/09/2022 4:06:51 PM By: [name] RN Signed: 03/09/2022 6:34:16 PM By: [name] RN Entered By: [name] on 03/09/2022 08:54:05 -------------------------------------------------------------------------------- Patient/Caregiver Education Details Patient Name: Date of Service: [name], [name] 6/21/2023andnbsp8:45 [place] Record Number: [id number] Patient Account Number: [id number] Date of Birth/Gender: Treating RN: 1956/02/13 (66 y.o. [name] Primary Care Physician: [name] Other Clinician: Referring Physician: Treating Physician/Extender: [name] in Treatment: 2 Education Assessment Education Provided To:  Patient Education Topics Provided Wound/Skin Impairment: Methods: Explain/Verbal Responses: Return demonstration correctly Electronic Signature(s) Signed: 03/09/2022 6:34:16 PM By: [name] RN Entered By: [name] on 03/09/2022 09:12:23 -------------------------------------------------------------------------------- Wound Assessment Details Patient Name: Date of Service: [name], [name] 03/09/2022 8:45 A M Medical Record Number:  [id number] Patient Account Number: [id number] Date of Birth/Sex: Treating RN: [birth year] (66 y.o. [name] Primary Care Kristia Jupiter: [name] Other Clinician: Referring Sharonann Malbrough: Treating Kianah Harries/Extender: [name] in Treatment: 2 Wound Status Wound Number: 20 Primary Diabetic Wound/Ulcer of the Lower Extremity Etiology: Wound Location: Left, Lateral Calcaneus Wound Open Wounding Event: Gradually Appeared Status: Date Acquired: 02/08/2022 Comorbid Sleep Apnea, Congestive Heart Failure, Hypertension, Type II Weeks Of Treatment: 2 History: Diabetes, Osteoarthritis, Neuropathy Clustered Wound: No Photos Wound Measurements Length: (cm) 0.7 Width: (cm) 1.5 Depth: (cm) 0.4 Area: (cm) 0.825 Volume: (cm) 0.33 % Reduction in Area: 31.8% % Reduction in Volume: 31.8% Epithelialization: None Tunneling: No Undermining: No Wound Description Classification: Grade 2 Exudate Amount: Medium Exudate Type: Serosanguineous Exudate Color: red, brown Foul Odor After Cleansing: No Slough/Fibrino Yes Wound Bed Granulation Amount: Large (67-100%) Exposed Structure Granulation Quality: Pink, Pale Fascia Exposed: No Necrotic Amount: Small (1-33%) Fat Layer (Subcutaneous Tissue) Exposed: Yes Necrotic Quality: Adherent Slough Tendon Exposed: No Muscle Exposed: No Joint Exposed: No Bone Exposed: No Treatment Notes Wound #20 (Calcaneus) Wound Laterality: Left, Lateral Cleanser Soap and Water Discharge Instruction: May shower and wash wound with dial antibacterial soap and water prior to dressing change. Wound Cleanser Discharge Instruction: Cleanse the wound with wound cleanser prior to applying a clean dressing using gauze sponges, not tissue or cotton balls. Peri-Wound Care Topical Gentamicin Discharge Instruction: As directed by physician Primary Dressing Hydrofera Blue Classic Foam, 2x2 in Discharge Instruction: Moisten with saline prior  to applying to wound bed Secondary Dressing Woven Gauze Sponge, Non-Sterile 4x4 in Discharge Instruction: Apply over primary dressing as directed. Secured With Elastic Bandage 4 inch (ACE bandage) Discharge Instruction: Secure with ACE bandage as directed. Kerlix Roll Sterile, 4.5x3.1 (in/yd) Discharge Instruction: Secure with Kerlix as directed. Paper Tape, 2x10 (in/yd) Discharge Instruction: Secure dressing with tape as directed. Compression Wrap Compression Stockings Add-Ons Electronic Signature(s) Signed: 03/09/2022 6:34:16 PM By: [name] RN Entered By: [name] on 03/09/2022 09:03:33 -------------------------------------------------------------------------------- Wound Assessment Details Patient Name: Date of Service: [name], [name] 03/09/2022 8:45 A M Medical Record Number: [id number] Patient Account Number: [id number] Date of Birth/Sex: Treating RN: 1956/05/15 (66 y.o. [name] Primary Care Ivyonna Hoelzel: [name] Other Clinician: Referring Tyana Butzer: Treating Ingvald Theisen/Extender: [name] in Treatment: 2 Wound Status Wound Number: 21 Primary Lesion Etiology: Wound Location: Right Abdomen - midline Wound Open Wounding Event: Blister Status: Date Acquired: 03/03/2022 Comorbid Sleep Apnea, Congestive Heart Failure, Hypertension, Type II Weeks Of Treatment: 0 History: Diabetes, Osteoarthritis, Neuropathy Clustered Wound: No Photos Wound Measurements Length: (cm) 0.3 Width: (cm) 0.6 Depth: (cm) 0.1 Area: (cm) 0.141 Volume: (cm) 0.014 % Reduction in Area: 0% % Reduction in Volume: 0% Epithelialization: Small (1-33%) Tunneling: No Undermining: No Wound Description Classification: Full Thickness Without Exposed Support Structures Exudate Amount: Medium Exudate Type: Serosanguineous Exudate Color: red, brown Foul Odor After Cleansing: No Slough/Fibrino No Wound Bed Granulation Amount: Large (67-100%)  Exposed Structure Granulation Quality: Pink, Pale Fascia Exposed: No Necrotic Amount: None Present (0%) Fat Layer (Subcutaneous Tissue) Exposed: Yes Tendon Exposed: No Muscle Exposed: No Joint Exposed: No Bone Exposed: No Treatment Notes Wound #21 (Abdomen - midline) Wound Laterality: Right Cleanser Soap  and Water Discharge Instruction: May shower and wash wound with dial antibacterial soap and water prior to dressing change. Wound Cleanser Discharge Instruction: Cleanse the wound with wound cleanser prior to applying a clean dressing using gauze sponges, not tissue or cotton balls. Peri-Wound Care Topical Primary Dressing Hydrofera Blue Classic Foam, 2x2 in Discharge Instruction: Moisten with saline prior to applying to wound bed Secondary Dressing Bordered Gauze, 2x2 in Discharge Instruction: Apply over primary dressing as directed. Woven Gauze Sponge, Non-Sterile 4x4 in Discharge Instruction: Apply over primary dressing as directed. Secured With Paper Tape, 2x10 (in/yd) Discharge Instruction: Secure dressing with tape as directed. Compression Wrap Compression Stockings Add-Ons Electronic Signature(s) Signed: 03/09/2022 6:34:16 PM By: [name] RN Entered By: [name] on 03/09/2022 09:02:51 -------------------------------------------------------------------------------- Vitals Details Patient Name: Date of Service: [name], [name] 03/09/2022 8:45 A M Medical Record Number: [id number] Patient Account Number: [id number] Date of Birth/Sex: Treating RN: Aug 08, 1956 (66 y.o. [name] Primary Care Jonesha Tsuchiya: [name] Other Clinician: Referring Jermal Dismuke: Treating Libbi Towner/Extender: [name] in Treatment: 2 Vital Signs Time Taken: 08:53 Temperature (F): 98.3 Height (in): 72 Pulse (bpm): 66 Weight (lbs): 392 Respiratory Rate (breaths/min): 20 Body Mass Index (BMI): 53.2 Blood Pressure (mmHg): 136/85 Reference  Range: 80 - 120 mg / dl Electronic Signature(s) Signed: 03/09/2022 6:34:16 PM By: [name] RN Entered By: [name] on 03/09/2022 08:53:58

## 2022-03-23 ENCOUNTER — Encounter (HOSPITAL_BASED_OUTPATIENT_CLINIC_OR_DEPARTMENT_OTHER): Payer: Medicare Other | Admitting: General Surgery

## 2022-03-30 ENCOUNTER — Other Ambulatory Visit: Payer: Self-pay | Admitting: Internal Medicine

## 2022-03-30 ENCOUNTER — Encounter (HOSPITAL_BASED_OUTPATIENT_CLINIC_OR_DEPARTMENT_OTHER): Payer: Medicare Other | Attending: General Surgery | Admitting: General Surgery

## 2022-03-30 DIAGNOSIS — I132 Hypertensive heart and chronic kidney disease with heart failure and with stage 5 chronic kidney disease, or end stage renal disease: Secondary | ICD-10-CM | POA: Insufficient documentation

## 2022-03-30 DIAGNOSIS — L97422 Non-pressure chronic ulcer of left heel and midfoot with fat layer exposed: Secondary | ICD-10-CM | POA: Diagnosis not present

## 2022-03-30 DIAGNOSIS — Z7985 Long-term (current) use of injectable non-insulin antidiabetic drugs: Secondary | ICD-10-CM | POA: Diagnosis not present

## 2022-03-30 DIAGNOSIS — Z992 Dependence on renal dialysis: Secondary | ICD-10-CM | POA: Insufficient documentation

## 2022-03-30 DIAGNOSIS — I10 Essential (primary) hypertension: Secondary | ICD-10-CM

## 2022-03-30 DIAGNOSIS — L98492 Non-pressure chronic ulcer of skin of other sites with fat layer exposed: Secondary | ICD-10-CM | POA: Insufficient documentation

## 2022-03-30 DIAGNOSIS — I5032 Chronic diastolic (congestive) heart failure: Secondary | ICD-10-CM | POA: Diagnosis not present

## 2022-03-30 DIAGNOSIS — Z6841 Body Mass Index (BMI) 40.0 and over, adult: Secondary | ICD-10-CM | POA: Diagnosis not present

## 2022-03-30 DIAGNOSIS — E1142 Type 2 diabetes mellitus with diabetic polyneuropathy: Secondary | ICD-10-CM | POA: Insufficient documentation

## 2022-03-30 DIAGNOSIS — E11622 Type 2 diabetes mellitus with other skin ulcer: Secondary | ICD-10-CM | POA: Diagnosis not present

## 2022-03-30 DIAGNOSIS — E11621 Type 2 diabetes mellitus with foot ulcer: Secondary | ICD-10-CM | POA: Insufficient documentation

## 2022-03-30 DIAGNOSIS — G4733 Obstructive sleep apnea (adult) (pediatric): Secondary | ICD-10-CM | POA: Diagnosis not present

## 2022-03-30 DIAGNOSIS — E1122 Type 2 diabetes mellitus with diabetic chronic kidney disease: Secondary | ICD-10-CM | POA: Insufficient documentation

## 2022-03-30 DIAGNOSIS — N186 End stage renal disease: Secondary | ICD-10-CM | POA: Diagnosis not present

## 2022-03-30 NOTE — Progress Notes (Signed)
BARI, HANDSHOE ([id number]) Visit Report for 03/30/2022 Arrival Information Details Patient Name: Date of Service: [name], [name] 03/30/2022 8:15 A M Medical Record Number: [id number] Patient Account Number: [id number] Date of Birth/Sex: Treating RN: Jan 13, 1956 (66 y.o. [name] Primary Care Niasia Lanphear: [name] Other Clinician: Referring Adelaine Roppolo: Treating Wylodean Shimmel/Extender: [name] in Treatment: 5 Visit Information History Since Last Visit Added or deleted any medications: No Patient Arrived: Wheel Chair Any new allergies or adverse reactions: No Arrival Time: 08:02 Had a fall or experienced change in No Accompanied By: self activities of daily living that may affect Transfer Assistance: None risk of falls: Patient Identification Verified: Yes Signs or symptoms of abuse/neglect since last visito No Patient Requires Transmission-Based Precautions: No Hospitalized since last visit: No Patient Has Alerts: No Implantable device outside of the clinic excluding No cellular tissue based products placed in the center since last visit: Has Dressing in Place as Prescribed: Yes Pain Present Now: No Electronic Signature(s) Signed: 03/30/2022 6:10:52 PM By: [name] RN Entered By: [name] on 03/30/2022 08:03:03 -------------------------------------------------------------------------------- Encounter Discharge Information Details Patient Name: Date of Service: [name], [name] 03/30/2022 8:15 A M Medical Record Number: [id number] Patient Account Number: [id number] Date of Birth/Sex: Treating RN: [birth year] (66 y.o. [name] Primary Care Avaree Gilberti: [name] Other Clinician: Referring Gryphon Vanderveen: Treating Adela Esteban/Extender: [name] in Treatment: 5 Encounter Discharge Information Items Post Procedure Vitals Discharge Condition: Stable Temperature (F): 98.1 Ambulatory Status:  Wheelchair Pulse (bpm): 59 Discharge Destination: Home Respiratory Rate (breaths/min): 20 Transportation: Private Auto Blood Pressure (mmHg): 120/60 Accompanied By: self Schedule Follow-up Appointment: Yes Clinical Summary of Care: Patient Declined Electronic Signature(s) Signed: 03/30/2022 6:10:52 PM By: [name] RN Entered By: [name] on 03/30/2022 09:04:30 -------------------------------------------------------------------------------- Lower Extremity Assessment Details Patient Name: Date of Service: [name], [name] 03/30/2022 8:15 A M Medical Record Number: [id number] Patient Account Number: [id number] Date of Birth/Sex: Treating RN: [birth year] (66 y.o. [name] Primary Care Lynann Demetrius: [name] Other Clinician: Referring Aeralyn Barna: Treating Haadiya Frogge/Extender: [name] in Treatment: 5 Edema Assessment Assessed: [Left: No] [Right: No] Edema: [Left: Ye] [Right: s] Calf Left: Right: Point of Measurement: 37 cm From Medial Instep 49 cm Ankle Left: Right: Point of Measurement: 14 cm From Medial Instep 28.8 cm Electronic Signature(s) Signed: 03/30/2022 6:10:52 PM By: [name] RN Entered By: [name] on 03/30/2022 08:08:31 -------------------------------------------------------------------------------- Multi Wound Chart Details Patient Name: Date of Service: [name], [name] 03/30/2022 8:15 A M Medical Record Number: [id number] Patient Account Number: [id number] Date of Birth/Sex: Treating RN: 11-27-1955 (66 y.o. [name] Primary Care Alexsander Cavins: [name] Other Clinician: Referring Rukaya Kleinschmidt: Treating Tannon Peerson/Extender: [name] in Treatment: 5 Vital Signs Height(in): 72 Pulse(bpm): 8 Weight(lbs): 392 Blood Pressure(mmHg): 120/60 Body Mass Index(BMI): 53.2 Temperature(F): 98.1 Respiratory Rate(breaths/min): 20 Photos: Left, Lateral Calcaneus Right,  Proximal Abdomen - midline Distal Abdomen - midline Wound Location: Gradually Appeared Blister Blister Wounding Event: Diabetic Wound/Ulcer of the Lower Lesion Lesion Primary Etiology: Extremity Sleep Apnea, Congestive Heart Sleep Apnea, Congestive Heart Sleep Apnea, Congestive Heart Comorbid History: Failure, Hypertension, Type II Failure, Hypertension, Type II Failure, Hypertension, Type II Diabetes, Osteoarthritis, Neuropathy Diabetes, Osteoarthritis, Neuropathy Diabetes, Osteoarthritis, Neuropathy 02/08/2022 03/03/2022 03/23/2022 Date Acquired: 5 3 0 Weeks of Treatment: Open Open Open Wound Status: No No No Wound Recurrence: 0.6x1.4x0.2 0.2x0.5x0.1 0.2x0.3x0.1 Measurements L x W x D (cm) 0.66 0.079 0.047 A (cm) : rea 0.132 0.008 0.005 Volume (cm) : 45.50%  44.00% 0.00% % Reduction in A rea: 72.70% 42.90% 0.00% % Reduction in Volume: 3 Starting Position 1 (o'clock): 4 Ending Position 1 (o'clock): 0.4 Maximum Distance 1 (cm): Yes No No Undermining: Grade 2 Full Thickness Without Exposed Full Thickness Without Exposed Classification: Support Structures Support Structures Medium Medium Medium Exudate A mount: Serosanguineous Serosanguineous Serosanguineous Exudate Type: red, brown red, brown red, brown Exudate Color: Medium (34-66%) Medium (34-66%) Large (67-100%) Granulation A mount: Pink, Pale Pink, Pale Red, Hyper-granulation Granulation Quality: Medium (34-66%) Medium (34-66%) None Present (0%) Necrotic A mount: Eschar, Adherent Slough Adherent Slough N/A Necrotic Tissue: Fat Layer (Subcutaneous Tissue): Yes Fat Layer (Subcutaneous Tissue): Yes Fat Layer (Subcutaneous Tissue): Yes Exposed Structures: Fascia: No Fascia: No Fascia: No Tendon: No Tendon: No Tendon: No Muscle: No Muscle: No Muscle: No Joint: No Joint: No Joint: No Bone: No Bone: No Bone: No None Small (1-33%) Small (1-33%) Epithelialization: Debridement - Excisional N/A  N/A Debridement: Pre-procedure Verification/Time Out 08:30 N/A N/A Taken: Lidocaine 5% topical ointment N/A N/A Pain Control: Callus, Subcutaneous, Slough N/A N/A Tissue Debrided: Skin/Subcutaneous Tissue N/A N/A Level: 0.84 N/A N/A Debridement A (sq cm): rea Curette N/A N/A Instrument: Minimum N/A N/A Bleeding: Pressure N/A N/A Hemostasis A chieved: 0 N/A N/A Procedural Pain: 0 N/A N/A Post Procedural Pain: Procedure was tolerated well N/A N/A Debridement Treatment Response: 0.6x1.4x0.2 N/A N/A Post Debridement Measurements L x W x D (cm) 0.132 N/A N/A Post Debridement Volume: (cm) Debridement Chemical Cauterization Chemical Cauterization Procedures Performed: Treatment Notes Electronic Signature(s) Signed: 03/30/2022 8:53:23 AM By: Fredirick Maudlin MD FACS Signed: 03/30/2022 6:10:52 PM By: [name] RN Entered By: Fredirick Maudlin on 03/30/2022 08:53:23 -------------------------------------------------------------------------------- Multi-Disciplinary Care Plan Details Patient Name: Date of Service: [name], [name] 03/30/2022 8:15 A M Medical Record Number: [id number] Patient Account Number: [id number] Date of Birth/Sex: Treating RN: [birth year] (66 y.o. [name] Primary Care Kenijah Benningfield: [name] Other Clinician: Referring Jolene Guyett: Treating Annaliyah Willig/Extender: [name] in Treatment: 5 Active Inactive Wound/Skin Impairment Nursing Diagnoses: Impaired tissue integrity Goals: Patient/caregiver will verbalize understanding of skin care regimen Date Initiated: 02/22/2022 Target Resolution Date: 07/19/2022 Goal Status: Active Interventions: Assess ulceration(s) every visit Treatment Activities: Skin care regimen initiated : 02/22/2022 Notes: Electronic Signature(s) Signed: 03/30/2022 6:10:52 PM By: [name] RN Entered By: [name] on 03/30/2022  09:01:43 -------------------------------------------------------------------------------- Pain Assessment Details Patient Name: Date of Service: [name], [name] 03/30/2022 8:15 A M Medical Record Number: [id number] Patient Account Number: [id number] Date of Birth/Sex: Treating RN: August 01, 1956 (66 y.o. [name] Primary Care Tresten Pantoja: [name] Other Clinician: Referring Kortlynn Poust: Treating Rajni Holsworth/Extender: [name] in Treatment: 5 Active Problems Location of Pain Severity and Description of Pain Patient Has Paino No Site Locations Pain Management and Medication Current Pain Management: Electronic Signature(s) Signed: 03/30/2022 6:10:52 PM By: [name] RN Entered By: [name] on 03/30/2022 08:06:06 -------------------------------------------------------------------------------- Patient/Caregiver Education Details Patient Name: Date of Service: [name], [name] 7/12/2023andnbsp8:15 [place] Record Number: [id number] Patient Account Number: [id number] Date of Birth/Gender: Treating RN: July 28, 1956 (66 y.o. [name] Primary Care Physician: [name] Other Clinician: Referring Physician: Treating Physician/Extender: [name] in Treatment: 5 Education Assessment Education Provided To: Patient Education Topics Provided Wound/Skin Impairment: Methods: Explain/Verbal Responses: Return demonstration correctly Electronic Signature(s) Signed: 03/30/2022 6:10:52 PM By: [name] RN Entered By: [name] on 03/30/2022 09:02:04 -------------------------------------------------------------------------------- Wound Assessment Details Patient Name: Date of Service: [name], [name] 03/30/2022 8:15 A M Medical Record Number:  [id number] Patient Account Number: [id number] Date of Birth/Sex: Treating RN: 09/08/1956 (66 y.o. [name] Primary Care Obdulio Mash: [name] Other Clinician: Referring Levert Heslop: Treating Lunette Tapp/Extender: [name] in Treatment: 5 Wound Status Wound Number: 20 Primary Diabetic Wound/Ulcer of the Lower Extremity Etiology: Wound Location: Left, Lateral Calcaneus Wound Open Wounding Event: Gradually Appeared Status: Date Acquired: 02/08/2022 Comorbid Sleep Apnea, Congestive Heart Failure, Hypertension, Type II Weeks Of Treatment: 5 History: Diabetes, Osteoarthritis, Neuropathy Clustered Wound: No Photos Wound Measurements Length: (cm) 0.6 Width: (cm) 1.4 Depth: (cm) 0.2 Area: (cm) 0.66 Volume: (cm) 0.132 Wound Description Classification: Grade 2 Exudate Amount: Medium Exudate Type: Serosanguineous Exudate Color: red, brown Foul Odor After Cleansing: N Slough/Fibrino Y % Reduction in Area: 45.5% % Reduction in Volume: 72.7% Epithelialization: None Undermining: Yes Starting Position (o'clock): 3 Ending Position (o'clock): 4 Maximum Distance: (cm) 0.4 o es Wound Bed Granulation Amount: Medium (34-66%) Exposed Structure Granulation Quality: Pink, Pale Fascia Exposed: No Necrotic Amount: Medium (34-66%) Fat Layer (Subcutaneous Tissue) Exposed: Yes Necrotic Quality: Eschar, Adherent Slough Tendon Exposed: No Muscle Exposed: No Joint Exposed: No Bone Exposed: No Treatment Notes Wound #20 (Calcaneus) Wound Laterality: Left, Lateral Cleanser Soap and Water Discharge Instruction: May shower and wash wound with dial antibacterial soap and water prior to dressing change. Wound Cleanser Discharge Instruction: Cleanse the wound with wound cleanser prior to applying a clean dressing using gauze sponges, not tissue or cotton balls. Peri-Wound Care Topical Gentamicin Discharge Instruction: As directed by physician Primary Dressing Hydrofera Blue Classic Foam, 2x2 in Discharge Instruction: Moisten with saline prior to applying to wound bed Secondary Dressing Woven Gauze  Sponge, Non-Sterile 4x4 in Discharge Instruction: Apply over primary dressing as directed. Secured With Elastic Bandage 4 inch (ACE bandage) Discharge Instruction: Secure with ACE bandage as directed. Kerlix Roll Sterile, 4.5x3.1 (in/yd) Discharge Instruction: Secure with Kerlix as directed. Paper Tape, 2x10 (in/yd) Discharge Instruction: Secure dressing with tape as directed. Compression Wrap Compression Stockings Add-Ons Electronic Signature(s) Signed: 03/30/2022 6:10:52 PM By: [name] RN Entered By: [name] on 03/30/2022 08:18:01 -------------------------------------------------------------------------------- Wound Assessment Details Patient Name: Date of Service: [name], [name] 03/30/2022 8:15 A M Medical Record Number: [id number] Patient Account Number: [id number] Date of Birth/Sex: Treating RN: 01-21-1956 (66 y.o. [name] Primary Care Rahsaan Weakland: [name] Other Clinician: Referring Evangelia Whitaker: Treating Johnryan Sao/Extender: [name] in Treatment: 5 Wound Status Wound Number: 21 Primary Lesion Etiology: Wound Location: Right, Proximal Abdomen - midline Wound Open Wounding Event: Blister Status: Date Acquired: 03/03/2022 Comorbid Sleep Apnea, Congestive Heart Failure, Hypertension, Type II Weeks Of Treatment: 3 History: Diabetes, Osteoarthritis, Neuropathy Clustered Wound: No Photos Wound Measurements Length: (cm) 0.2 Width: (cm) 0.5 Depth: (cm) 0.1 Area: (cm) 0.079 Volume: (cm) 0.008 % Reduction in Area: 44% % Reduction in Volume: 42.9% Epithelialization: Small (1-33%) Tunneling: No Undermining: No Wound Description Classification: Full Thickness Without Exposed Support Structures Exudate Amount: Medium Exudate Type: Serosanguineous Exudate Color: red, brown Foul Odor After Cleansing: No Slough/Fibrino No Wound Bed Granulation Amount: Medium (34-66%) Exposed Structure Granulation Quality: Pink,  Pale Fascia Exposed: No Necrotic Amount: Medium (34-66%) Fat Layer (Subcutaneous Tissue) Exposed: Yes Necrotic Quality: Adherent Slough Tendon Exposed: No Muscle Exposed: No Joint Exposed: No Bone Exposed: No Treatment Notes Wound #21 (Abdomen - midline) Wound Laterality: Right, Proximal Cleanser Soap and Water Discharge Instruction: May shower and wash wound with dial antibacterial soap and water prior to dressing change. Wound Cleanser Discharge Instruction: Cleanse the wound with wound cleanser prior  to applying a clean dressing using gauze sponges, not tissue or cotton balls. Peri-Wound Care Topical Primary Dressing KerraCel Ag Gelling Fiber Dressing, 2x2 in (silver alginate) Discharge Instruction: Apply silver alginate to wound bed as instructed Secondary Dressing Bordered Gauze, 2x2 in Discharge Instruction: Apply over primary dressing as directed. Woven Gauze Sponge, Non-Sterile 4x4 in Discharge Instruction: Apply over primary dressing as directed. Secured With Paper Tape, 2x10 (in/yd) Discharge Instruction: Secure dressing with tape as directed. Compression Wrap Compression Stockings Add-Ons Electronic Signature(s) Signed: 03/30/2022 6:10:52 PM By: [name] RN Entered By: [name] on 03/30/2022 08:18:33 -------------------------------------------------------------------------------- Wound Assessment Details Patient Name: Date of Service: [name], [name] 03/30/2022 8:15 A M Medical Record Number: [id number] Patient Account Number: [id number] Date of Birth/Sex: Treating RN: [birth year] (66 y.o. [name] Primary Care Serina Nichter: [name] Other Clinician: Referring Krishika Bugge: Treating Mrk Buzby/Extender: [name] in Treatment: 5 Wound Status Wound Number: 22 Primary Lesion Etiology: Wound Location: Distal Abdomen - midline Wound Open Wounding Event: Blister Status: Date Acquired: 03/23/2022 Comorbid Sleep  Apnea, Congestive Heart Failure, Hypertension, Type II Weeks Of Treatment: 0 History: Diabetes, Osteoarthritis, Neuropathy Clustered Wound: No Photos Wound Measurements Length: (cm) 0.2 Width: (cm) 0.3 Depth: (cm) 0.1 Area: (cm) 0.047 Volume: (cm) 0.005 % Reduction in Area: 0% % Reduction in Volume: 0% Epithelialization: Small (1-33%) Tunneling: No Undermining: No Wound Description Classification: Full Thickness Without Exposed Support Structures Exudate Amount: Medium Exudate Type: Serosanguineous Exudate Color: red, brown Foul Odor After Cleansing: No Slough/Fibrino No Wound Bed Granulation Amount: Large (67-100%) Exposed Structure Granulation Quality: Red, Hyper-granulation Fascia Exposed: No Necrotic Amount: None Present (0%) Fat Layer (Subcutaneous Tissue) Exposed: Yes Tendon Exposed: No Muscle Exposed: No Joint Exposed: No Bone Exposed: No Treatment Notes Wound #22 (Abdomen - midline) Wound Laterality: Distal Cleanser Soap and Water Discharge Instruction: May shower and wash wound with dial antibacterial soap and water prior to dressing change. Wound Cleanser Discharge Instruction: Cleanse the wound with wound cleanser prior to applying a clean dressing using gauze sponges, not tissue or cotton balls. Peri-Wound Care Topical Primary Dressing KerraCel Ag Gelling Fiber Dressing, 2x2 in (silver alginate) Discharge Instruction: Apply silver alginate to wound bed as instructed Secondary Dressing Bordered Gauze, 2x2 in Discharge Instruction: Apply over primary dressing as directed. Woven Gauze Sponge, Non-Sterile 4x4 in Discharge Instruction: Apply over primary dressing as directed. Secured With Paper Tape, 2x10 (in/yd) Discharge Instruction: Secure dressing with tape as directed. Compression Wrap Compression Stockings Add-Ons Electronic Signature(s) Signed: 03/30/2022 6:10:52 PM By: [name] RN Entered By: [name] on 03/30/2022  08:19:03 -------------------------------------------------------------------------------- Vitals Details Patient Name: Date of Service: [name], [name] 03/30/2022 8:15 A M Medical Record Number: [id number] Patient Account Number: [id number] Date of Birth/Sex: Treating RN: 28-May-1956 (66 y.o. [name] Primary Care Devereaux Grayson: [name] Other Clinician: Referring Anjenette Gerbino: Treating Averey Trompeter/Extender: [name] in Treatment: 5 Vital Signs Time Taken: 08:02 Temperature (F): 98.1 Height (in): 72 Pulse (bpm): 59 Weight (lbs): 392 Respiratory Rate (breaths/min): 20 Body Mass Index (BMI): 53.2 Blood Pressure (mmHg): 120/60 Reference Range: 80 - 120 mg / dl Electronic Signature(s) Signed: 03/30/2022 6:10:52 PM By: [name] RN Entered By: [name] on 03/30/2022 08:05:58

## 2022-03-30 NOTE — Progress Notes (Signed)
CHRISTIA, COAXUM ([id number]) Visit Report for 03/30/2022 Chief Complaint Document Details Patient Name: Date of Service: Huck, PAYETTE 03/30/2022 8:15 A M Medical Record Number: [id number] Patient Account Number: [id number] Date of Birth/Sex: Treating RN: August 25, 1956 (66 y.o. [name] Primary Care Provider: [name] Other Clinician: Referring Provider: Treating Provider/Extender: [name] in Treatment: 5 Information Obtained from: Patient Chief Complaint 11/09/2018; patient is here for review of wounds on his dorsal right first and second toes and on the dorsal left first toe. 02/22/2022: The patient is here for a new ulcer on his posterior left heel. Electronic Signature(s) Signed: 03/30/2022 8:53:32 AM By: Fredirick Maudlin MD FACS Entered By: Fredirick Maudlin on 03/30/2022 08:53:32 -------------------------------------------------------------------------------- Debridement Details Patient Name: Date of Service: [name], [place] 03/30/2022 8:15 A M Medical Record Number: [id number] Patient Account Number: [id number] Date of Birth/Sex: Treating RN: 1956/04/08 (66 y.o. [name] Primary Care Provider: [name] Other Clinician: Referring Provider: Treating Provider/Extender: [name] in Treatment: 5 Debridement Performed for Assessment: Wound #20 Left,Lateral Calcaneus Performed By: Physician Fredirick Maudlin, MD Debridement Type: Debridement Severity of Tissue Pre Debridement: Fat layer exposed Level of Consciousness (Pre-procedure): Awake and Alert Pre-procedure Verification/Time Out Yes - 08:30 Taken: Start Time: 08:30 Pain Control: Lidocaine 5% topical ointment T Area Debrided (L x W): otal 0.6 (cm) x 1.4 (cm) = 0.84 (cm) Tissue and other material debrided: Non-Viable, Callus, Slough, Subcutaneous, Slough Level: Skin/Subcutaneous Tissue Debridement Description: Excisional Instrument:  Curette Bleeding: Minimum Hemostasis Achieved: Pressure End Time: 08:31 Procedural Pain: 0 Post Procedural Pain: 0 Response to Treatment: Procedure was tolerated well Level of Consciousness (Post- Awake and Alert procedure): Post Debridement Measurements of Total Wound Length: (cm) 0.6 Width: (cm) 1.4 Depth: (cm) 0.2 Volume: (cm) 0.132 Character of Wound/Ulcer Post Debridement: Improved Severity of Tissue Post Debridement: Fat layer exposed Post Procedure Diagnosis Same as Pre-procedure Electronic Signature(s) Signed: 03/30/2022 9:14:37 AM By: Fredirick Maudlin MD FACS Signed: 03/30/2022 6:10:52 PM By: [name] RN Entered By: [name] on 03/30/2022 08:31:50 -------------------------------------------------------------------------------- HPI Details Patient Name: Date of Service: [name], [place] 03/30/2022 8:15 A M Medical Record Number: [id number] Patient Account Number: [id number] Date of Birth/Sex: Treating RN: 15-Mar-1956 (66 y.o. [name] Primary Care Provider: [name] Other Clinician: Referring Provider: Treating Provider/Extender: [name] in Treatment: 5 History of Present Illness HPI Description: READMISSION 11/09/2018 This is a now 66 year old man that we had in this clinic over a multitude of years but most recently in 2015. He had wounds on his plantar foot initially in 2007 and 2008 and I think subsequently was seen in 2012 then 2014 into the mid part of 2015 with wounds on his toes. He is a type II diabetic with peripheral neuropathy but no known PAD. The patient states he was doing well until about a month ago he noted an area on the left great toe which was superficial and then an area on the right great and second toes about a week later. He is not sure how this happened he simply noticed this when he was lying in bed at night. He has been using peroxide. He has not seen a doctor. He has not been on  antibiotics and has had no x-rays. Past medical history; type 2 diabetes with peripheral neuropathy, hypertension, hypothyroidism, low B12 levels, iron deficiency anemia, ventral hernia, diabetic nephropathy, chronic diastolic heart failure and obstructive sleep apnea. ABIs in this clinic were 1.2 on the right and  1.1 on the left 2/28; patient arrived last week for areas on his left first right first and right second toes. X-ray of the right foot did not show any osseous abnormality. Culture of the right great toe showed Staphylococcus aureus methicillin sensitive and he is on dicloxacillin which he started 3 days ago. 3/6; patient with wounds on his right first and second toes and left first toes. The area over the left first toe is extensive we have been using silver alginate. Culture grew MSSA and he is on dicloxacillin which I prescribed on the 25th. He is still taking this which I am not really sure of the reason. He expressed knowledge that this was 4 times a day he should have been out of this around March 2 if he picked it up on the 25th or they gave him the right number of pills. Nevertheless our nurses report more purulent drainage and odor 3/17-Patient returns for the 3 wounds the left first toe and the right first and second toe dorsal surfaces. The left first toe is closed up and healed, the right first toe dorsal surface wound is extensive and appears the same as last time, no purulent drainage or odor reported per nurse, left second toe wound is slightly smaller. He has completed the dicloxacillin as of today. Noted that his culture previously had grown MSSA. We have been using calcium alginate to the wounds. X-rays have been reviewed. ABIs were reviewed as well. 3/27; patient returns for review of wounds on his right first and right second toes. The wound on the left first toe is healed. 4/10; 2-week follow-up. The area on the right second toe dorsally is healed. Per the patient the  left first toe remains healed. He still has an extensive area dorsally over the right first toe inner phalangeal joint area. Rolled up hyper granulated tissue with probably a nonviable surface 4/24; 2-week follow-up. The areas on the right second toe dorsally and right first toe dorsally is healed. The area on the dorsal left first toe over the inner phalangeal joint is smaller but still hyper granulated we have been using Hydrofera Blue 5/15; 3-week follow-up. The areas on the right first toe. Not the left as I said on 4/24. He is using Hydrofera Blue. 5/29; 3-week follow-up. The area on his dorsal right toe over the right inter phalangeal joint is just about closed. We have been using Hydrofera Blue. In passing he showed me his right thumb which is exquisitely tender over the tip of the digit. 6/5; the area on the dorsal right toe over the inner phalangeal joint is closed. Right thumb is better which I gave him antibiotics for last week. Still some tenderness but considerably improved READMISSION 02/22/2022 This is a 66 year old type II diabetic with congestive heart failure, end-stage renal disease on hemodialysis, peripheral artery disease, and morbid obesity. He has developed an ulcer on his left posterior heel that he believes is secondary to his health assistance not adequately moisturizing his feet. He says it first appeared about 2 weeks ago. He does not have sensation in his feet. He has not noticed any foul odor or drainage. His last hemoglobin A1c was 7.9 at the beginning of January. ABI in clinic today was 1.15. 03/09/2022: He has a new wound on his abdomen that he says started out as a lump and then the skin broke open when he bumped it on a portion of his hospital bed at home. There is fat exposed. The wound  on his heel is a little bit smaller but still has a very pale surface. There is periwound callus accumulation. 03/30/2022: He had another small wound opened up on his abdomen. He  says that he thinks these are starting as blisters. He has been on Mounjaro for weight loss and it looks like the loose skin resulting from losing weight might be rubbing and causing small friction injuries. The wound that I treated with chemical cauterization last visit is much smaller; the new wound has a very similar appearance to this. His heel ulcer is smaller with just a little bit of slough buildup on the surface. He continues to have fairly impressive periwound callus formation. Electronic Signature(s) Signed: 03/30/2022 8:56:33 AM By: Fredirick Maudlin MD FACS Entered By: Fredirick Maudlin on 03/30/2022 08:56:32 -------------------------------------------------------------------------------- Chemical Cauterization Details Patient Name: Date of Service: [name], [place] 03/30/2022 8:15 A M Medical Record Number: [id number] Patient Account Number: [id number] Date of Birth/Sex: Treating RN: [birth year] (66 y.o. [name] Primary Care Provider: [name] Other Clinician: Referring Provider: Treating Provider/Extender: [name] in Treatment: 5 Procedure Performed for: Wound #21 Right,Proximal Abdomen - midline Performed By: Physician Fredirick Maudlin, MD Post Procedure Diagnosis Same as Pre-procedure Electronic Signature(s) Signed: 03/30/2022 9:14:37 AM By: Fredirick Maudlin MD FACS Signed: 03/30/2022 6:10:52 PM By: [name] RN Entered By: [name] on 03/30/2022 08:32:10 -------------------------------------------------------------------------------- Chemical Cauterization Details Patient Name: Date of Service: ROSTON, GRUNEWALD 03/30/2022 8:15 A M Medical Record Number: [id number] Patient Account Number: [id number] Date of Birth/Sex: Treating RN: 1956-07-15 (66 y.o. [name] Primary Care Provider: [name] Other Clinician: Referring Provider: Treating Provider/Extender: [name] in  Treatment: 5 Procedure Performed for: Wound #22 Distal Abdomen - midline Performed By: Physician Fredirick Maudlin, MD Post Procedure Diagnosis Same as Pre-procedure Electronic Signature(s) Signed: 03/30/2022 9:14:37 AM By: Fredirick Maudlin MD FACS Signed: 03/30/2022 6:10:52 PM By: [name] RN Entered By: [name] on 03/30/2022 08:32:23 -------------------------------------------------------------------------------- Physical Exam Details Patient Name: Date of Service: LEGACY, LACIVITA 03/30/2022 8:15 A M Medical Record Number: [id number] Patient Account Number: [id number] Date of Birth/Sex: Treating RN: [birth year] (66 y.o. [name] Primary Care Provider: [name] Other Clinician: Referring Provider: Treating Provider/Extender: [name] in Treatment: 5 Constitutional . Slightly bradycardic, asymptomatic.. . . No acute distress.Marland Kitchen Respiratory Normal work of breathing on room air.. Notes 03/30/2022: He had another small wound open up on his abdomen. He says that he thinks these are starting as blisters. He has been on Mounjaro for weight loss and it looks like the loose skin resulting from losing weight might be rubbing and causing small friction injuries. The wound that I treated with chemical cauterization last visit is much smaller; the new wound has a very similar appearance to this. His heel ulcer is smaller with just a little bit of slough buildup on the surface. He continues to have fairly impressive periwound callus formation. Electronic Signature(s) Signed: 03/30/2022 8:58:58 AM By: Fredirick Maudlin MD FACS Entered By: Fredirick Maudlin on 03/30/2022 08:58:58 -------------------------------------------------------------------------------- Physician Orders Details Patient Name: Date of Service: [name], [place] 03/30/2022 8:15 A M Medical Record Number: [id number] Patient Account Number: [id number] Date of Birth/Sex: Treating  RN: July 29, 1956 (66 y.o. [name] Primary Care Provider: [name] Other Clinician: Referring Provider: Treating Provider/Extender: [name] in Treatment: 5 Verbal / Phone Orders: No Diagnosis Coding ICD-10 Coding Code Description [phone] Non-pressure chronic ulcer of left heel and midfoot  with fat layer exposed E11.621 Type 2 diabetes mellitus with foot ulcer N18.6 End stage renal disease I50.9 Heart failure, unspecified I10 Essential (primary) hypertension I73.9 Peripheral vascular disease, unspecified L98.492 Non-pressure chronic ulcer of skin of other sites with fat layer exposed Follow-up Appointments ppointment in 2 weeks. - Dr Celine Ahr Room 3 Thursday July 20th at 8:45am Return A Licensed conveyancer Other Bathing/Shower/Hygiene Orders/Instructions: - change wound dressing after bathing Off-Loading Wound #20 Left,Lateral Calcaneus Other: - When in bed place a couple of pillows behind the left calf to elevate heel off the bed [place] please change wound dressing 2 times a week. Wound Treatment Wound #20 - Calcaneus Wound Laterality: Left, Lateral Cleanser: Soap and Water 3 x Per Week/30 Days Discharge Instructions: May shower and wash wound with dial antibacterial soap and water prior to dressing change. Cleanser: Wound Cleanser 3 x Per Week/30 Days Discharge Instructions: Cleanse the wound with wound cleanser prior to applying a clean dressing using gauze sponges, not tissue or cotton balls. Topical: Gentamicin 3 x Per Week/30 Days Discharge Instructions: As directed by physician Prim Dressing: Hydrofera Blue Classic Foam, 2x2 in 3 x Per Week/30 Days ary Discharge Instructions: Moisten with saline prior to applying to wound bed Secondary Dressing: Woven Gauze Sponge, Non-Sterile 4x4 in 3 x Per Week/30 Days Discharge Instructions: Apply over primary dressing as  directed. Secured With: Elastic Bandage 4 inch (ACE bandage) 3 x Per Week/30 Days Discharge Instructions: Secure with ACE bandage as directed. Secured With: [employer], 4.5x3.1 (in/yd) 3 x Per Week/30 Days Discharge Instructions: Secure with Kerlix as directed. Secured With: Paper Tape, 2x10 (in/yd) 3 x Per Week/30 Days Discharge Instructions: Secure dressing with tape as directed. Wound #21 - Abdomen - midline Wound Laterality: Right, Proximal Cleanser: Soap and Water 3 x Per Week/30 Days Discharge Instructions: May shower and wash wound with dial antibacterial soap and water prior to dressing change. Cleanser: Wound Cleanser 3 x Per Week/30 Days Discharge Instructions: Cleanse the wound with wound cleanser prior to applying a clean dressing using gauze sponges, not tissue or cotton balls. Prim Dressing: KerraCel Ag Gelling Fiber Dressing, 2x2 in (silver alginate) 3 x Per Week/30 Days ary Discharge Instructions: Apply silver alginate to wound bed as instructed Secondary Dressing: Bordered Gauze, 2x2 in 3 x Per Week/30 Days Discharge Instructions: Apply over primary dressing as directed. Secondary Dressing: Woven Gauze Sponge, Non-Sterile 4x4 in 3 x Per Week/30 Days Discharge Instructions: Apply over primary dressing as directed. Secured With: Paper Tape, 2x10 (in/yd) 3 x Per Week/30 Days Discharge Instructions: Secure dressing with tape as directed. Wound #22 - Abdomen - midline Wound Laterality: Distal Cleanser: Soap and Water 3 x Per Week/30 Days Discharge Instructions: May shower and wash wound with dial antibacterial soap and water prior to dressing change. Cleanser: Wound Cleanser 3 x Per Week/30 Days Discharge Instructions: Cleanse the wound with wound cleanser prior to applying a clean dressing using gauze sponges, not tissue or cotton balls. Prim Dressing: KerraCel Ag Gelling Fiber Dressing, 2x2 in (silver alginate) 3 x Per Week/30 Days ary Discharge Instructions: Apply  silver alginate to wound bed as instructed Secondary Dressing: Bordered Gauze, 2x2 in 3 x Per Week/30 Days Discharge Instructions: Apply over primary dressing as directed. Secondary Dressing: Woven Gauze Sponge, Non-Sterile 4x4 in 3 x Per Week/30 Days Discharge Instructions: Apply over primary dressing as directed. Secured With: Paper Tape, 2x10 (in/yd) 3 x Per Week/30 Days Discharge Instructions: Secure dressing  with tape as directed. Electronic Signature(s) Signed: 03/30/2022 9:14:37 AM By: Fredirick Maudlin MD FACS Entered By: Fredirick Maudlin on 03/30/2022 09:00:26 -------------------------------------------------------------------------------- Problem List Details Patient Name: Date of Service: [name], [place] 03/30/2022 8:15 A M Medical Record Number: [id number] Patient Account Number: [id number] Date of Birth/Sex: Treating RN: Oct 16, 1955 (66 y.o. [name] Primary Care Provider: Other Clinician: [name] Referring Provider: Treating Provider/Extender: [name] in Treatment: 5 Active Problems ICD-10 Encounter Code Description Active Date MDM Diagnosis [phone] Non-pressure chronic ulcer of left heel and midfoot with fat layer exposed 02/22/2022 No Yes E11.621 Type 2 diabetes mellitus with foot ulcer 02/22/2022 No Yes N18.6 End stage renal disease 02/22/2022 No Yes I50.9 Heart failure, unspecified 02/22/2022 No Yes I10 Essential (primary) hypertension 02/22/2022 No Yes I73.9 Peripheral vascular disease, unspecified 02/22/2022 No Yes L98.492 Non-pressure chronic ulcer of skin of other sites with fat layer exposed 03/09/2022 No Yes Inactive Problems Resolved Problems Electronic Signature(s) Signed: 03/30/2022 8:53:08 AM By: Fredirick Maudlin MD FACS Entered By: Fredirick Maudlin on 03/30/2022 08:53:08 -------------------------------------------------------------------------------- Progress Note Details Patient Name: Date of Service: [name], [place]  03/30/2022 8:15 A M Medical Record Number: [id number] Patient Account Number: [id number] Date of Birth/Sex: Treating RN: Mar 16, 1956 (66 y.o. [name] Primary Care Provider: [name] Other Clinician: Referring Provider: Treating Provider/Extender: [name] in Treatment: 5 Subjective Chief Complaint Information obtained from Patient 11/09/2018; patient is here for review of wounds on his dorsal right first and second toes and on the dorsal left first toe. 02/22/2022: The patient is here for a new ulcer on his posterior left heel. History of Present Illness (HPI) READMISSION 11/09/2018 This is a now 66 year old man that we had in this clinic over a multitude of years but most recently in 2015. He had wounds on his plantar foot initially in 2007 and 2008 and I think subsequently was seen in 2012 then 2014 into the mid part of 2015 with wounds on his toes. He is a type II diabetic with peripheral neuropathy but no known PAD. The patient states he was doing well until about a month ago he noted an area on the left great toe which was superficial and then an area on the right great and second toes about a week later. He is not sure how this happened he simply noticed this when he was lying in bed at night. He has been using peroxide. He has not seen a doctor. He has not been on antibiotics and has had no x-rays. Past medical history; type 2 diabetes with peripheral neuropathy, hypertension, hypothyroidism, low B12 levels, iron deficiency anemia, ventral hernia, diabetic nephropathy, chronic diastolic heart failure and obstructive sleep apnea. ABIs in this clinic were 1.2 on the right and 1.1 on the left 2/28; patient arrived last week for areas on his left first right first and right second toes. X-ray of the right foot did not show any osseous abnormality. Culture of the right great toe showed Staphylococcus aureus methicillin sensitive and he is on  dicloxacillin which he started 3 days ago. 3/6; patient with wounds on his right first and second toes and left first toes. The area over the left first toe is extensive we have been using silver alginate. Culture grew MSSA and he is on dicloxacillin which I prescribed on the 25th. He is still taking this which I am not really sure of the reason. He expressed knowledge that this was 4 times a day he should have  been out of this around March 2 if he picked it up on the 25th or they gave him the right number of pills. Nevertheless our nurses report more purulent drainage and odor 3/17-Patient returns for the 3 wounds the left first toe and the right first and second toe dorsal surfaces. The left first toe is closed up and healed, the right first toe dorsal surface wound is extensive and appears the same as last time, no purulent drainage or odor reported per nurse, left second toe wound is slightly smaller. He has completed the dicloxacillin as of today. Noted that his culture previously had grown MSSA. We have been using calcium alginate to the wounds. X-rays have been reviewed. ABIs were reviewed as well. 3/27; patient returns for review of wounds on his right first and right second toes. The wound on the left first toe is healed. 4/10; 2-week follow-up. The area on the right second toe dorsally is healed. Per the patient the left first toe remains healed. He still has an extensive area dorsally over the right first toe inner phalangeal joint area. Rolled up hyper granulated tissue with probably a nonviable surface 4/24; 2-week follow-up. The areas on the right second toe dorsally and right first toe dorsally is healed. The area on the dorsal left first toe over the inner phalangeal joint is smaller but still hyper granulated we have been using Hydrofera Blue 5/15; 3-week follow-up. The areas on the right first toe. Not the left as I said on 4/24. He is using Hydrofera Blue. 5/29; 3-week follow-up.  The area on his dorsal right toe over the right inter phalangeal joint is just about closed. We have been using Hydrofera Blue. In passing he showed me his right thumb which is exquisitely tender over the tip of the digit. 6/5; the area on the dorsal right toe over the inner phalangeal joint is closed. Right thumb is better which I gave him antibiotics for last week. Still some tenderness but considerably improved READMISSION 02/22/2022 This is a 66 year old type II diabetic with congestive heart failure, end-stage renal disease on hemodialysis, peripheral artery disease, and morbid obesity. He has developed an ulcer on his left posterior heel that he believes is secondary to his health assistance not adequately moisturizing his feet. He says it first appeared about 2 weeks ago. He does not have sensation in his feet. He has not noticed any foul odor or drainage. His last hemoglobin A1c was 7.9 at the beginning of January. ABI in clinic today was 1.15. 03/09/2022: He has a new wound on his abdomen that he says started out as a lump and then the skin broke open when he bumped it on a portion of his hospital bed at home. There is fat exposed. The wound on his heel is a little bit smaller but still has a very pale surface. There is periwound callus accumulation. 03/30/2022: He had another small wound opened up on his abdomen. He says that he thinks these are starting as blisters. He has been on Mounjaro for weight loss and it looks like the loose skin resulting from losing weight might be rubbing and causing small friction injuries. The wound that I treated with chemical cauterization last visit is much smaller; the new wound has a very similar appearance to this. His heel ulcer is smaller with just a little bit of slough buildup on the surface. He continues to have fairly impressive periwound callus formation. Patient History Information obtained from Patient. Family History Cancer -  Mother,Father,  Diabetes - Mother,Father,Siblings, Heart Disease - Siblings, Lung Disease - Mother, No family history of Hereditary Spherocytosis, Hypertension, Kidney Disease, Seizures, Stroke, Thyroid Problems, Tuberculosis. Social History Never smoker, Marital Status - Single, Alcohol Use - Never, Drug Use - No History, Caffeine Use - Daily - Coffee. Medical History Eyes Denies history of Cataracts, Glaucoma, Optic Neuritis Ear/Nose/Mouth/Throat Denies history of Chronic sinus problems/congestion, Middle ear problems Hematologic/Lymphatic Denies history of Anemia, Hemophilia, Human Immunodeficiency Virus, Lymphedema, Sickle Cell Disease Respiratory Patient has history of Sleep Apnea Denies history of Aspiration, Asthma, Chronic Obstructive Pulmonary Disease (COPD), Pneumothorax, Tuberculosis Cardiovascular Patient has history of Congestive Heart Failure, Hypertension Denies history of Angina, Arrhythmia, Coronary Artery Disease, Deep Vein Thrombosis, Hypotension, Myocardial Infarction, Peripheral Arterial Disease, Peripheral Venous Disease, Phlebitis, Vasculitis Gastrointestinal Denies history of Cirrhosis , Colitis, Crohnoos, Hepatitis A, Hepatitis B, Hepatitis C Endocrine Patient has history of Type II Diabetes Denies history of Type I Diabetes Genitourinary Denies history of End Stage Renal Disease Immunological Denies history of Lupus Erythematosus, Raynaudoos, Scleroderma Integumentary (Skin) Denies history of History of Burn Musculoskeletal Patient has history of Osteoarthritis Denies history of Gout, Rheumatoid Arthritis, Osteomyelitis Neurologic Patient has history of Neuropathy - Diabetic Denies history of Dementia, Quadriplegia, Paraplegia, Seizure Disorder Oncologic Denies history of Received Chemotherapy, Received Radiation Psychiatric Denies history of Anorexia/bulimia, Confinement Anxiety Objective Constitutional Slightly bradycardic, asymptomatic.Marland Kitchen No acute  distress.. Vitals Time Taken: 8:02 AM, Height: 72 in, Weight: 392 lbs, BMI: 53.2, Temperature: 98.1 F, Pulse: 59 bpm, Respiratory Rate: 20 breaths/min, Blood Pressure: 120/60 mmHg. Respiratory Normal work of breathing on room air.. General Notes: 03/30/2022: He had another small wound open up on his abdomen. He says that he thinks these are starting as blisters. He has been on Mounjaro for weight loss and it looks like the loose skin resulting from losing weight might be rubbing and causing small friction injuries. The wound that I treated with chemical cauterization last visit is much smaller; the new wound has a very similar appearance to this. His heel ulcer is smaller with just a little bit of slough buildup on the surface. He continues to have fairly impressive periwound callus formation. Integumentary (Hair, Skin) Wound #20 status is Open. Original cause of wound was Gradually Appeared. The date acquired was: 02/08/2022. The wound has been in treatment 5 weeks. The wound is located on the Left,Lateral Calcaneus. The wound measures 0.6cm length x 1.4cm width x 0.2cm depth; 0.66cm^2 area and 0.132cm^3 volume. There is Fat Layer (Subcutaneous Tissue) exposed. There is undermining starting at 3:00 and ending at 4:00 with a maximum distance of 0.4cm. There is a medium amount of serosanguineous drainage noted. There is medium (34-66%) pink, pale granulation within the wound bed. There is a medium (34-66%) amount of necrotic tissue within the wound bed including Eschar and Adherent Slough. Wound #21 status is Open. Original cause of wound was Blister. The date acquired was: 03/03/2022. The wound has been in treatment 3 weeks. The wound is located on the Right,Proximal Abdomen - midline. The wound measures 0.2cm length x 0.5cm width x 0.1cm depth; 0.079cm^2 area and 0.008cm^3 volume. There is Fat Layer (Subcutaneous Tissue) exposed. There is no tunneling or undermining noted. There is a medium amount  of serosanguineous drainage noted. There is medium (34-66%) pink, pale granulation within the wound bed. There is a medium (34-66%) amount of necrotic tissue within the wound bed including Adherent Slough. Wound #22 status is Open. Original cause of wound was Blister. The date acquired was:  03/23/2022. The wound is located on the Distal Abdomen - midline. The wound measures 0.2cm length x 0.3cm width x 0.1cm depth; 0.047cm^2 area and 0.005cm^3 volume. There is Fat Layer (Subcutaneous Tissue) exposed. There is no tunneling or undermining noted. There is a medium amount of serosanguineous drainage noted. There is large (67-100%) red, hyper - granulation within the wound bed. There is no necrotic tissue within the wound bed. Assessment Active Problems ICD-10 Non-pressure chronic ulcer of left heel and midfoot with fat layer exposed Type 2 diabetes mellitus with foot ulcer End stage renal disease Heart failure, unspecified Essential (primary) hypertension Peripheral vascular disease, unspecified Non-pressure chronic ulcer of skin of other sites with fat layer exposed Procedures Wound #20 Pre-procedure diagnosis of Wound #20 is a Diabetic Wound/Ulcer of the Lower Extremity located on the Left,Lateral Calcaneus .Severity of Tissue Pre Debridement is: Fat layer exposed. There was a Excisional Skin/Subcutaneous Tissue Debridement with a total area of 0.84 sq cm performed by Fredirick Maudlin, MD. With the following instrument(s): Curette to remove Non-Viable tissue/material. Material removed includes Callus, Subcutaneous Tissue, and Slough after achieving pain control using Lidocaine 5% topical ointment. No specimens were taken. A time out was conducted at 08:30, prior to the start of the procedure. A Minimum amount of bleeding was controlled with Pressure. The procedure was tolerated well with a pain level of 0 throughout and a pain level of 0 following the procedure. Post Debridement Measurements:  0.6cm length x 1.4cm width x 0.2cm depth; 0.132cm^3 volume. Character of Wound/Ulcer Post Debridement is improved. Severity of Tissue Post Debridement is: Fat layer exposed. Post procedure Diagnosis Wound #20: Same as Pre-Procedure Wound #21 Pre-procedure diagnosis of Wound #21 is a Lesion located on the Right,Proximal Abdomen - midline . An Chemical Cauterization procedure was performed by Fredirick Maudlin, MD. Post procedure Diagnosis Wound #21: Same as Pre-Procedure Wound #22 Pre-procedure diagnosis of Wound #22 is a Lesion located on the Distal Abdomen - midline . An Chemical Cauterization procedure was performed by Fredirick Maudlin, MD. Post procedure Diagnosis Wound #22: Same as Pre-Procedure Plan Follow-up Appointments: Return Appointment in 2 weeks. - Dr Celine Ahr Room 3 Thursday July 20th at 8:45am Bathing/ Shower/ Hygiene: Other Bathing/Shower/Hygiene Orders/Instructions: - change wound dressing after bathing Off-Loading: Wound #20 Left,Lateral Calcaneus: Other: - When in bed place a couple of pillows behind the left calf to elevate heel off the bed Home Health: Other Home Health Orders/Instructions: - Home Health please change wound dressing 2 times a week. WOUND #20: - Calcaneus Wound Laterality: Left, Lateral Cleanser: Soap and Water 3 x Per Week/30 Days Discharge Instructions: May shower and wash wound with dial antibacterial soap and water prior to dressing change. Cleanser: Wound Cleanser 3 x Per Week/30 Days Discharge Instructions: Cleanse the wound with wound cleanser prior to applying a clean dressing using gauze sponges, not tissue or cotton balls. Topical: Gentamicin 3 x Per Week/30 Days Discharge Instructions: As directed by physician Prim Dressing: Hydrofera Blue Classic Foam, 2x2 in 3 x Per Week/30 Days ary Discharge Instructions: Moisten with saline prior to applying to wound bed Secondary Dressing: Woven Gauze Sponge, Non-Sterile 4x4 in 3 x Per Week/30  Days Discharge Instructions: Apply over primary dressing as directed. Secured With: Elastic Bandage 4 inch (ACE bandage) 3 x Per Week/30 Days Discharge Instructions: Secure with ACE bandage as directed. Secured With: [employer], 4.5x3.1 (in/yd) 3 x Per Week/30 Days Discharge Instructions: Secure with Kerlix as directed. Secured With: Paper T ape, 2x10 (in/yd) 3 x Per  Week/30 Days Discharge Instructions: Secure dressing with tape as directed. WOUND #21: - Abdomen - midline Wound Laterality: Right, Proximal Cleanser: Soap and Water 3 x Per Week/30 Days Discharge Instructions: May shower and wash wound with dial antibacterial soap and water prior to dressing change. Cleanser: Wound Cleanser 3 x Per Week/30 Days Discharge Instructions: Cleanse the wound with wound cleanser prior to applying a clean dressing using gauze sponges, not tissue or cotton balls. Prim Dressing: KerraCel Ag Gelling Fiber Dressing, 2x2 in (silver alginate) 3 x Per Week/30 Days ary Discharge Instructions: Apply silver alginate to wound bed as instructed Secondary Dressing: Bordered Gauze, 2x2 in 3 x Per Week/30 Days Discharge Instructions: Apply over primary dressing as directed. Secondary Dressing: Woven Gauze Sponge, Non-Sterile 4x4 in 3 x Per Week/30 Days Discharge Instructions: Apply over primary dressing as directed. Secured With: Paper T ape, 2x10 (in/yd) 3 x Per Week/30 Days Discharge Instructions: Secure dressing with tape as directed. WOUND #22: - Abdomen - midline Wound Laterality: Distal Cleanser: Soap and Water 3 x Per Week/30 Days Discharge Instructions: May shower and wash wound with dial antibacterial soap and water prior to dressing change. Cleanser: Wound Cleanser 3 x Per Week/30 Days Discharge Instructions: Cleanse the wound with wound cleanser prior to applying a clean dressing using gauze sponges, not tissue or cotton balls. Prim Dressing: KerraCel Ag Gelling Fiber Dressing, 2x2 in (silver  alginate) 3 x Per Week/30 Days ary Discharge Instructions: Apply silver alginate to wound bed as instructed Secondary Dressing: Bordered Gauze, 2x2 in 3 x Per Week/30 Days Discharge Instructions: Apply over primary dressing as directed. Secondary Dressing: Woven Gauze Sponge, Non-Sterile 4x4 in 3 x Per Week/30 Days Discharge Instructions: Apply over primary dressing as directed. Secured With: Paper T ape, 2x10 (in/yd) 3 x Per Week/30 Days Discharge Instructions: Secure dressing with tape as directed. 03/30/2022: He had another small wound open up on his abdomen. He says that he thinks these are starting as blisters. He has been on Mounjaro for weight loss and it looks like the loose skin resulting from losing weight might be rubbing and causing small friction injuries. The wound that I treated with chemical cauterization last visit is much smaller; the new wound has a very similar appearance to this. His heel ulcer is smaller with just a little bit of slough buildup on the surface. He continues to have fairly impressive periwound callus formation. I used silver nitrate to chemically cauterize the 2 wounds on his abdomen. We will apply silver alginate and foam border dressings to these places. I debrided slough, subcutaneous tissue, and periwound callus from his heel ulcer. Continue gentamicin and Hydrofera Blue in this location. Follow-up in 2 weeks. Electronic Signature(s) Signed: 03/30/2022 9:03:53 AM By: Fredirick Maudlin MD FACS Entered By: Fredirick Maudlin on 03/30/2022 09:03:53 -------------------------------------------------------------------------------- HxROS Details Patient Name: Date of Service: [name], Sholom 03/30/2022 8:15 A M Medical Record Number: [id number] Patient Account Number: [id number] Date of Birth/Sex: Treating RN: [birth year] (66 y.o. [name] Primary Care Provider: [name] Other Clinician: Referring Provider: Treating Provider/Extender: [name] in Treatment: 5 Information Obtained From Patient Eyes Medical History: Negative for: Cataracts; Glaucoma; Optic Neuritis Ear/Nose/Mouth/Throat Medical History: Negative for: Chronic sinus problems/congestion; Middle ear problems Hematologic/Lymphatic Medical History: Negative for: Anemia; Hemophilia; Human Immunodeficiency Virus; Lymphedema; Sickle Cell Disease Respiratory Medical History: Positive for: Sleep Apnea Negative for: Aspiration; Asthma; Chronic Obstructive Pulmonary Disease (COPD); Pneumothorax; Tuberculosis Cardiovascular Medical History: Positive for: Congestive Heart Failure; Hypertension Negative for:  Angina; Arrhythmia; Coronary Artery Disease; Deep Vein Thrombosis; Hypotension; Myocardial Infarction; Peripheral Arterial Disease; Peripheral Venous Disease; Phlebitis; Vasculitis Gastrointestinal Medical History: Negative for: Cirrhosis ; Colitis; Crohns; Hepatitis A; Hepatitis B; Hepatitis C Endocrine Medical History: Positive for: Type II Diabetes Negative for: Type I Diabetes Time with diabetes: 1994 Treated with: Insulin Blood sugar tested every day: No Genitourinary Medical History: Negative for: End Stage Renal Disease Immunological Medical History: Negative for: Lupus Erythematosus; Raynauds; Scleroderma Integumentary (Skin) Medical History: Negative for: History of Burn Musculoskeletal Medical History: Positive for: Osteoarthritis Negative for: Gout; Rheumatoid Arthritis; Osteomyelitis Neurologic Medical History: Positive for: Neuropathy - Diabetic Negative for: Dementia; Quadriplegia; Paraplegia; Seizure Disorder Oncologic Medical History: Negative for: Received Chemotherapy; Received Radiation Psychiatric Medical History: Negative for: Anorexia/bulimia; Confinement Anxiety Immunizations Pneumococcal Vaccine: Received Pneumococcal Vaccination: No Implantable Devices No devices added Family and Social  History Cancer: Yes - Mother,Father; Diabetes: Yes - Mother,Father,Siblings; Heart Disease: Yes - Siblings; Hereditary Spherocytosis: No; Hypertension: No; Kidney Disease: No; Lung Disease: Yes - Mother; Seizures: No; Stroke: No; Thyroid Problems: No; Tuberculosis: No; Never smoker; Marital Status - Single; Alcohol Use: Never; Drug Use: No History; Caffeine Use: Daily - Coffee; Financial Concerns: No; Food, Clothing or Shelter Needs: No; Support System Lacking: No; Transportation Concerns: No Electronic Signature(s) Signed: 03/30/2022 9:14:37 AM By: Fredirick Maudlin MD FACS Signed: 03/30/2022 6:10:52 PM By: [name] RN Entered By: Fredirick Maudlin on 03/30/2022 08:58:20 -------------------------------------------------------------------------------- SuperBill Details Patient Name: Date of Service: [name], Earle 03/30/2022 Medical Record Number: [id number] Patient Account Number: [id number] Date of Birth/Sex: Treating RN: 10-29-1955 (66 y.o. [name] Primary Care Provider: [name] Other Clinician: Referring Provider: Treating Provider/Extender: [name] in Treatment: 5 Diagnosis Coding ICD-10 Codes Code Description [phone] Non-pressure chronic ulcer of left heel and midfoot with fat layer exposed E11.621 Type 2 diabetes mellitus with foot ulcer N18.6 End stage renal disease I50.9 Heart failure, unspecified I10 Essential (primary) hypertension I73.9 Peripheral vascular disease, unspecified L98.492 Non-pressure chronic ulcer of skin of other sites with fat layer exposed Facility Procedures CPT4 Code: [id number] 1 I Description: [place] - DEB SUBQ TISSUE 20 SQ CM/< ICD-10 Diagnosis Description L97.422 Non-pressure chronic ulcer of left heel and midfoot with fat layer exposed 7250 - CHEM CAUT GRANULATION TISS CD-10 Diagnosis Description L98.492 Non-pressure chronic  ulcer of skin of other sites with fat layer exposed Modifier:  1 Quantity: 1 Physician Procedures : CPT4 Code Description Modifier [id number] - WC PHYS LEVEL 3 - EST PT 25 ICD-10 Diagnosis Description L97.422 Non-pressure chronic ulcer of left heel and midfoot with fat layer exposed L98.492 Non-pressure chronic ulcer of skin of other sites with  fat layer exposed E11.621 Type 2 diabetes mellitus with foot ulcer I73.9 Peripheral vascular disease, unspecified Quantity: 1 : [id number] - WC PHYS SUBQ TISS 20 SQ CM ICD-10 Diagnosis Description L97.422 Non-pressure chronic ulcer of left heel and midfoot with fat layer exposed Quantity: 1 : [id number] - WC PHYS CHEM CAUT GRAN TISSUE ICD-10 Diagnosis Description L98.492 Non-pressure chronic ulcer of skin of other sites with fat layer exposed Quantity: 1 Electronic Signature(s) Signed: 03/30/2022 9:05:46 AM By: Fredirick Maudlin MD FACS Entered By: Fredirick Maudlin on 03/30/2022 09:05:46

## 2022-04-04 ENCOUNTER — Ambulatory Visit (INDEPENDENT_AMBULATORY_CARE_PROVIDER_SITE_OTHER): Payer: Medicare Other | Admitting: Podiatry

## 2022-04-04 ENCOUNTER — Encounter: Payer: Self-pay | Admitting: Podiatry

## 2022-04-04 DIAGNOSIS — M79674 Pain in right toe(s): Secondary | ICD-10-CM

## 2022-04-04 DIAGNOSIS — M79675 Pain in left toe(s): Secondary | ICD-10-CM

## 2022-04-04 DIAGNOSIS — E1142 Type 2 diabetes mellitus with diabetic polyneuropathy: Secondary | ICD-10-CM | POA: Diagnosis not present

## 2022-04-04 DIAGNOSIS — B351 Tinea unguium: Secondary | ICD-10-CM

## 2022-04-07 ENCOUNTER — Encounter (HOSPITAL_BASED_OUTPATIENT_CLINIC_OR_DEPARTMENT_OTHER): Payer: Medicare Other | Admitting: General Surgery

## 2022-04-07 DIAGNOSIS — E11621 Type 2 diabetes mellitus with foot ulcer: Secondary | ICD-10-CM | POA: Diagnosis not present

## 2022-04-09 NOTE — Progress Notes (Signed)
  Subjective:  Patient ID: [name], male    DOB: 1956-05-09,  MRN: [id number]  [name] presents to clinic today for at risk foot care with history of diabetic neuropathy and thick, elongated toenails b/l lower extremities which are tender when wearing enclosed shoe gear.  Patient states last A1c was >10%.  Patient did not check blood glucose today.  New problem(s): He has been seeing Dr. Fredirick Maudlin in [place] for heel ulcer LLE. Other wounds documented are right great toe and right 2nd toe.  PCP is [name], NP , and last visit was  December 14, 2021  Allergies  Allergen Reactions   Shellfish Allergy Anaphylaxis   Lisinopril Cough   Relistor [Methylnaltrexone Bromide] Other (See Comments)    confusion   Lovastatin Itching    Review of Systems: Negative except as noted in the HPI.  Objective: [name] is a pleasant 66 y.o. male, morbidly obese, in NAD. AAO x 3. Sitting up in motorized chair.  Vascular Examination: Vascular status intact b/l with palpable pedal pulses. Pedal hair absent b/l.  CFT immediate b/l. No pain with calf compression b/l. Skin temperature gradient WNL b/l. Dependent edema noted b/l LE.  Neurological Examination: Protective sensation diminished with 10g monofilament b/l. Vibratory sensation diminished b/l.  Dermatological Examination: Pedal skin with normal turgor, texture and tone b/l. No hyperkeratotic lesions noted b/l. Toenails 1-5 right foot and 1-4 left foot elongated, discolored, dystrophic, thickened, and crumbly with subungual debris and tenderness to dorsal palpation. Anonychia noted L 5th toe. Nailbed(s) epithelialized.  No hyperkeratotic nor porokeratotic lesions present on today's visit.  Dressing on left heel clean, dry and intact secured with ace bandage. No wounds noted right great toe and right 2nd digit.  Musculoskeletal Examination: Muscle strength 5/5 to b/l LE. Utilizes motorized chair for mobility  assistance.  Radiographs: None  Last A1c:      Latest Ref Rng & Units 09/28/2021   10:26 PM  Hemoglobin A1C  Hemoglobin-A1c 4.8 - 5.6 % 7.9    Assessment/Plan: 1. Pain due to onychomycosis of toenails of both feet   2. Diabetic peripheral neuropathy ([place])     -Patient was evaluated and treated. All patient's and/or POA's questions/concerns answered on today's visit. -Patient being followed by Dr. Celine Ahr in [place] for left heel pressure ulcer. -Continue foot and shoe inspections daily. Monitor blood glucose per PCP/Endocrinologist's recommendations. -Mycotic toenails 1-4 bilaterally and R 5th toe were debrided in length and girth with sterile nail nippers and dremel without iatrogenic bleeding. -Patient/POA to call should there be question/concern in the interim.   Return in about 3 months (around 07/05/2022).  Marzetta Board, DPM

## 2022-04-11 NOTE — Progress Notes (Signed)
KENDARRIUS, TANZI ([id number]) Visit Report for 04/07/2022 Arrival Information Details Patient Name: Date of Service: GRANVEL, PROUDFOOT 04/07/2022 8:45 A M Medical Record Number: [id number] Patient Account Number: [id number] Date of Birth/Sex: Treating RN: March 25, 1956 (66 y.o. Mare Ferrari Primary Care Helane Briceno: [name] Other Clinician: Referring Jakyrah Holladay: Treating Deniqua Perry/Extender: [name] in Treatment: 6 Visit Information History Since Last Visit Added or deleted any medications: No Patient Arrived: Wheel Chair Any new allergies or adverse reactions: No Arrival Time: 08:31 Had a fall or experienced change in No Accompanied By: self activities of daily living that may affect Transfer Assistance: None risk of falls: Patient Identification Verified: Yes Signs or symptoms of abuse/neglect since last visito No Secondary Verification Process Completed: Yes Hospitalized since last visit: No Patient Requires Transmission-Based Precautions: No Implantable device outside of the clinic excluding No Patient Has Alerts: No cellular tissue based products placed in the center since last visit: Has Dressing in Place as Prescribed: No Pain Present Now: No Electronic Signature(s) Signed: 04/07/2022 5:36:35 PM By: Sharyn Creamer RN, BSN Entered By: Sharyn Creamer on 04/07/2022 08:32:13 -------------------------------------------------------------------------------- Encounter Discharge Information Details Patient Name: Date of Service: [name], [place] 04/07/2022 8:45 A M Medical Record Number: [id number] Patient Account Number: [id number] Date of Birth/Sex: Treating RN: 1956/08/16 (66 y.o. Mare Ferrari Primary Care Kalima Saylor: [name] Other Clinician: Referring Mikki Ziff: Treating Levaughn Puccinelli/Extender: [name] in Treatment: 6 Encounter Discharge Information Items Post Procedure Vitals Discharge Condition:  Stable Temperature (F): 98.0 Ambulatory Status: Wheelchair Pulse (bpm): 60 Discharge Destination: Home Respiratory Rate (breaths/min): 18 Transportation: Private Auto Blood Pressure (mmHg): 106/59 Accompanied By: self Schedule Follow-up Appointment: Yes Clinical Summary of Care: Patient Declined Electronic Signature(s) Signed: 04/07/2022 5:36:35 PM By: Sharyn Creamer RN, BSN Entered By: Sharyn Creamer on 04/07/2022 09:19:15 -------------------------------------------------------------------------------- Lower Extremity Assessment Details Patient Name: Date of Service: [name], [place] 04/07/2022 8:45 A M Medical Record Number: [id number] Patient Account Number: [id number] Date of Birth/Sex: Treating RN: [birth year] (66 y.o. Mare Ferrari Primary Care Nazli Penn: [name] Other Clinician: Referring Rayfield Beem: Treating Analynn Daum/Extender: [name] in Treatment: 6 Edema Assessment Assessed: [Left: No] [Right: No] Edema: [Left: Ye] [Right: s] Calf Left: Right: Point of Measurement: 37 cm From Medial Instep 51 cm Ankle Left: Right: Point of Measurement: 14 cm From Medial Instep 29 cm Vascular Assessment Pulses: Dorsalis Pedis Palpable: [Left:Yes] Electronic Signature(s) Signed: 04/07/2022 5:36:35 PM By: Sharyn Creamer RN, BSN Entered By: Sharyn Creamer on 04/07/2022 08:36:42 -------------------------------------------------------------------------------- Multi Wound Chart Details Patient Name: Date of Service: [name], [place] 04/07/2022 8:45 A M Medical Record Number: [id number] Patient Account Number: [id number] Date of Birth/Sex: Treating RN: 1956-06-24 (66 y.o. [name] Primary Care Arnetia Bronk: [name] Other Clinician: Referring Theotis Gerdeman: Treating Lorrene Graef/Extender: [name] in Treatment: 6 Vital Signs Height(in): 72 Pulse(bpm): 60 Weight(lbs): 392 Blood Pressure(mmHg): 106/59 Body Mass  Index(BMI): 53.2 Temperature(F): 98.0 Respiratory Rate(breaths/min): 18 Photos: [20:Left, Lateral Calcaneus] [21:Right, Proximal Abdomen - midline] [22:Distal Abdomen - midline] Wound Location: [20:Gradually Appeared] [21:Blister] [22:Blister] Wounding Event: [20:Diabetic Wound/Ulcer of the Lower] [21:Lesion] [22:Lesion] Primary Etiology: [20:Extremity Sleep Apnea, Congestive Heart] [21:Sleep Apnea, Congestive Heart] [22:Sleep Apnea, Congestive Heart] Comorbid History: [20:Failure, Hypertension, Type II Diabetes, Osteoarthritis, NeuropathyDiabetes, Osteoarthritis, Neuropathy Diabetes, Osteoarthritis, Neuropathy 02/08/2022] [21:Failure, Hypertension, Type II 03/03/2022] [22:Failure, Hypertension, Type II  03/23/2022] Date Acquired: [20:6] [21:4] [22:1] Weeks of Treatment: [20:Open] [21:Open] [22:Open] Wound Status: [20:No] [21:No] [22:No] Wound Recurrence: [20:0.5x1.1x0.1] [21:0.2x0.2x0.1] [22:0.3x0.3x0.1] Measurements L x W x D (  cm) [20:0.432] [21:0.031] [22:0.071] A (cm) : rea [20:0.043] [21:0.003] [22:0.007] Volume (cm) : [20:64.30%] [21:78.00%] [22:-51.10%] % Reduction in A [20:rea: 91.10%] [21:78.60%] [22:-40.00%] % Reduction in Volume: [20:Grade 2] [21:Full Thickness Without Exposed] [22:Full Thickness Without Exposed] Classification: [20:Medium] [21:Support Structures Medium] [22:Support Structures Medium] Exudate A mount: [20:Serosanguineous] [21:Serosanguineous] [22:Serosanguineous] Exudate Type: [20:red, brown] [21:red, brown] [22:red, brown] Exudate Color: [20:Medium (34-66%)] [21:Large (67-100%)] [22:Large (67-100%)] Granulation A mount: [20:Pink, Pale] [21:Red] [22:Red, Hyper-granulation] Granulation Quality: [20:Medium (34-66%)] [21:None Present (0%)] [22:None Present (0%)] Necrotic A mount: [20:Fat Layer (Subcutaneous Tissue): Yes Fat Layer (Subcutaneous Tissue): Yes Fat Layer (Subcutaneous Tissue): Yes] Exposed Structures: [20:Fascia: No Tendon: No Muscle: No Joint: No  Bone: No Medium (34-66%)] [21:Fascia: No Tendon: No Muscle: No Joint: No Bone: No Large (67-100%)] [22:Fascia: No Tendon: No Muscle: No Joint: No Bone: No Small (1-33%)] Epithelialization: [20:Debridement - Excisional] [21:N/A] [22:N/A] Debridement: Pre-procedure Verification/Time Out 08:46 [21:N/A] [22:N/A] Taken: [20:Lidocaine 4% Topical Solution] [21:N/A] [22:N/A] Pain Control: [20:Subcutaneous, Slough] [21:N/A] [22:N/A] Tissue Debrided: [20:Skin/Subcutaneous Tissue] [21:N/A] [22:N/A] Level: [20:0.55] [21:N/A] [22:N/A] Debridement A (sq cm): [20:rea Curette] [21:N/A] [22:N/A] Instrument: [20:Minimum] [21:N/A] [22:N/A] Bleeding: [20:Pressure] [21:N/A] [22:N/A] Hemostasis A chieved: [20:0] [21:N/A] [22:N/A] Procedural Pain: [20:0] [21:N/A] [22:N/A] Post Procedural Pain: [20:Procedure was tolerated well] [21:N/A] [22:N/A] Debridement Treatment Response: [20:0.5x1.1x0.1] [21:N/A] [22:N/A] Post Debridement Measurements L x W x D (cm) [20:0.043] [21:N/A] [22:N/A] Post Debridement Volume: (cm) [20:Debridement] [21:N/A] [22:Chemical Cauterization] Treatment Notes Electronic Signature(s) Signed: 04/07/2022 8:56:53 AM By: Fredirick Maudlin MD FACS Signed: 04/11/2022 12:55:32 PM By: [name] RN Entered By: Fredirick Maudlin on 04/07/2022 08:56:53 -------------------------------------------------------------------------------- Multi-Disciplinary Care Plan Details Patient Name: Date of Service: [name], [place] 04/07/2022 8:45 A M Medical Record Number: [id number] Patient Account Number: [id number] Date of Birth/Sex: Treating RN: 1956/05/17 (66 y.o. Mare Ferrari Primary Care Waunita Sandstrom: [name] Other Clinician: Referring Bernice Mullin: Treating Wang Granada/Extender: [name] in Treatment: 6 Active Inactive Wound/Skin Impairment Nursing Diagnoses: Impaired tissue integrity Goals: Patient/caregiver will verbalize understanding of skin care regimen Date  Initiated: 02/22/2022 Target Resolution Date: 07/19/2022 Goal Status: Active Interventions: Assess ulceration(s) every visit Treatment Activities: Skin care regimen initiated : 02/22/2022 Notes: Electronic Signature(s) Signed: 04/07/2022 5:36:35 PM By: Sharyn Creamer RN, BSN Entered By: Sharyn Creamer on 04/07/2022 08:47:32 -------------------------------------------------------------------------------- Pain Assessment Details Patient Name: Date of Service: [name], [place] 04/07/2022 8:45 A M Medical Record Number: [id number] Patient Account Number: [id number] Date of Birth/Sex: Treating RN: 03/09/1956 (66 y.o. Mare Ferrari Primary Care Giankarlo Leamer: [name] Other Clinician: Referring Keonte Daubenspeck: Treating Shundra Wirsing/Extender: [name] in Treatment: 6 Active Problems Location of Pain Severity and Description of Pain Patient Has Paino No Site Locations Pain Management and Medication Current Pain Management: Electronic Signature(s) Signed: 04/07/2022 5:36:35 PM By: Sharyn Creamer RN, BSN Entered By: Sharyn Creamer on 04/07/2022 19:50:93 -------------------------------------------------------------------------------- Patient/Caregiver Education Details Patient Name: Date of Service: [name], [place] 7/20/2023andnbsp8:45 [place] Number: [id number] Patient Account Number: [id number] Date of Birth/Gender: Treating RN: [birth year] (66 y.o. Mare Ferrari Primary Care Physician: [name] Other Clinician: Referring Physician: Treating Physician/Extender: [name] in Treatment: 6 Education Assessment Education Provided To: Patient Education Topics Provided Wound/Skin Impairment: Methods: Explain/Verbal Responses: State content correctly Electronic Signature(s) Signed: 04/07/2022 5:36:35 PM By: Sharyn Creamer RN, BSN Entered By: Sharyn Creamer on 04/07/2022  08:47:51 -------------------------------------------------------------------------------- Wound Assessment Details Patient Name: Date of Service: [name], [place] 04/07/2022 8:45 A M Medical Record Number: [id number] Patient Account Number: [id number] Date of Birth/Sex: Treating RN: May 24, 1956 (  66 y.o. Mare Ferrari Primary Care Tanvi Gatling: [name] Other Clinician: Referring Natesha Hassey: Treating Maleaha Hughett/Extender: [name] in Treatment: 6 Wound Status Wound Number: 20 Primary Diabetic Wound/Ulcer of the Lower Extremity Etiology: Wound Location: Left, Lateral Calcaneus Wound Open Wounding Event: Gradually Appeared Status: Date Acquired: 02/08/2022 Comorbid Sleep Apnea, Congestive Heart Failure, Hypertension, Type II Weeks Of Treatment: 6 History: Diabetes, Osteoarthritis, Neuropathy Clustered Wound: No Photos Wound Measurements Length: (cm) 0.5 Width: (cm) 1.1 Depth: (cm) 0.1 Area: (cm) 0.432 Volume: (cm) 0.043 % Reduction in Area: 64.3% % Reduction in Volume: 91.1% Epithelialization: Medium (34-66%) Tunneling: No Undermining: No Wound Description Classification: Grade 2 Exudate Amount: Medium Exudate Type: Serosanguineous Exudate Color: red, brown Foul Odor After Cleansing: No Slough/Fibrino Yes Wound Bed Granulation Amount: Medium (34-66%) Exposed Structure Granulation Quality: Pink, Pale Fascia Exposed: No Necrotic Amount: Medium (34-66%) Fat Layer (Subcutaneous Tissue) Exposed: Yes Necrotic Quality: Adherent Slough Tendon Exposed: No Muscle Exposed: No Joint Exposed: No Bone Exposed: No Treatment Notes Wound #20 (Calcaneus) Wound Laterality: Left, Lateral Cleanser Soap and Water Discharge Instruction: May shower and wash wound with dial antibacterial soap and water prior to dressing change. Wound Cleanser Discharge Instruction: Cleanse the wound with wound cleanser prior to applying a clean dressing using gauze  sponges, not tissue or cotton balls. Peri-Wound Care Topical Gentamicin Discharge Instruction: As directed by physician Primary Dressing Hydrofera Blue Classic Foam, 2x2 in Discharge Instruction: Moisten with saline prior to applying to wound bed Secondary Dressing Woven Gauze Sponge, Non-Sterile 4x4 in Discharge Instruction: Apply over primary dressing as directed. Secured With Elastic Bandage 4 inch (ACE bandage) Discharge Instruction: Secure with ACE bandage as directed. Kerlix Roll Sterile, 4.5x3.1 (in/yd) Discharge Instruction: Secure with Kerlix as directed. Paper Tape, 2x10 (in/yd) Discharge Instruction: Secure dressing with tape as directed. Compression Wrap Compression Stockings Add-Ons Electronic Signature(s) Signed: 04/07/2022 5:36:35 PM By: Sharyn Creamer RN, BSN Signed: 04/11/2022 5:10:34 PM By: Blanche East RN Entered By: Blanche East on 04/07/2022 08:42:29 -------------------------------------------------------------------------------- Wound Assessment Details Patient Name: Date of Service: [name], [place] 04/07/2022 8:45 A M Medical Record Number: [id number] Patient Account Number: [id number] Date of Birth/Sex: Treating RN: [birth year] (66 y.o. Mare Ferrari Primary Care Labrittany Wechter: Other Clinician: [name] Referring Stokely Jeancharles: Treating Emiliana Blaize/Extender: [name] in Treatment: 6 Wound Status Wound Number: 21 Primary Lesion Etiology: Wound Location: Right, Proximal Abdomen - midline Wound Open Wounding Event: Blister Status: Date Acquired: 03/03/2022 Comorbid Sleep Apnea, Congestive Heart Failure, Hypertension, Type II Weeks Of Treatment: 4 History: Diabetes, Osteoarthritis, Neuropathy Clustered Wound: No Photos Wound Measurements Length: (cm) 0.2 Width: (cm) 0.2 Depth: (cm) 0.1 Area: (cm) 0.031 Volume: (cm) 0.003 % Reduction in Area: 78% % Reduction in Volume: 78.6% Epithelialization: Large  (67-100%) Tunneling: No Undermining: No Wound Description Classification: Full Thickness Without Exposed Support Structures Exudate Amount: Medium Exudate Type: Serosanguineous Exudate Color: red, brown Foul Odor After Cleansing: No Slough/Fibrino No Wound Bed Granulation Amount: Large (67-100%) Exposed Structure Granulation Quality: Red Fascia Exposed: No Necrotic Amount: None Present (0%) Fat Layer (Subcutaneous Tissue) Exposed: Yes Tendon Exposed: No Muscle Exposed: No Joint Exposed: No Bone Exposed: No Treatment Notes Wound #21 (Abdomen - midline) Wound Laterality: Right, Proximal Cleanser Soap and Water Discharge Instruction: May shower and wash wound with dial antibacterial soap and water prior to dressing change. Wound Cleanser Discharge Instruction: Cleanse the wound with wound cleanser prior to applying a clean dressing using gauze sponges, not tissue or cotton balls. Peri-Wound Care Topical Primary Dressing KerraCel  Ag Gelling Fiber Dressing, 2x2 in (silver alginate) Discharge Instruction: Apply silver alginate to wound bed as instructed Secondary Dressing Bordered Gauze, 2x2 in Discharge Instruction: Apply over primary dressing as directed. Woven Gauze Sponge, Non-Sterile 4x4 in Discharge Instruction: Apply over primary dressing as directed. Secured With Paper Tape, 2x10 (in/yd) Discharge Instruction: Secure dressing with tape as directed. Compression Wrap Compression Stockings Add-Ons Electronic Signature(s) Signed: 04/07/2022 5:36:35 PM By: Sharyn Creamer RN, BSN Signed: 04/11/2022 5:10:34 PM By: Blanche East RN Entered By: Blanche East on 04/07/2022 08:43:05 -------------------------------------------------------------------------------- Wound Assessment Details Patient Name: Date of Service: [name], [place] 04/07/2022 8:45 A M Medical Record Number: [id number] Patient Account Number: [id number] Date of Birth/Sex: Treating RN: [birth year] (66 y.o. Mare Ferrari Primary Care Adelynne Joerger: [name] Other Clinician: Referring Sonali Wivell: Treating Khayree Delellis/Extender: [name] in Treatment: 6 Wound Status Wound Number: 22 Primary Lesion Etiology: Wound Location: Distal Abdomen - midline Wound Open Wounding Event: Blister Status: Date Acquired: 03/23/2022 Comorbid Sleep Apnea, Congestive Heart Failure, Hypertension, Type II Weeks Of Treatment: 1 History: Diabetes, Osteoarthritis, Neuropathy Clustered Wound: No Photos Wound Measurements Length: (cm) 0.3 Width: (cm) 0.3 Depth: (cm) 0.1 Area: (cm) 0.071 Volume: (cm) 0.007 % Reduction in Area: -51.1% % Reduction in Volume: -40% Epithelialization: Small (1-33%) Tunneling: No Undermining: No Wound Description Classification: Full Thickness Without Exposed Support Structures Exudate Amount: Medium Exudate Type: Serosanguineous Exudate Color: red, brown Foul Odor After Cleansing: No Slough/Fibrino No Wound Bed Granulation Amount: Large (67-100%) Exposed Structure Granulation Quality: Red, Hyper-granulation Fascia Exposed: No Necrotic Amount: None Present (0%) Fat Layer (Subcutaneous Tissue) Exposed: Yes Tendon Exposed: No Muscle Exposed: No Joint Exposed: No Bone Exposed: No Treatment Notes Wound #22 (Abdomen - midline) Wound Laterality: Distal Cleanser Soap and Water Discharge Instruction: May shower and wash wound with dial antibacterial soap and water prior to dressing change. Wound Cleanser Discharge Instruction: Cleanse the wound with wound cleanser prior to applying a clean dressing using gauze sponges, not tissue or cotton balls. Peri-Wound Care Topical Primary Dressing KerraCel Ag Gelling Fiber Dressing, 2x2 in (silver alginate) Discharge Instruction: Apply silver alginate to wound bed as instructed Secondary Dressing Bordered Gauze, 2x2 in Discharge Instruction: Apply over primary dressing as directed. Woven Gauze Sponge,  Non-Sterile 4x4 in Discharge Instruction: Apply over primary dressing as directed. Secured With Paper Tape, 2x10 (in/yd) Discharge Instruction: Secure dressing with tape as directed. Compression Wrap Compression Stockings Add-Ons Electronic Signature(s) Signed: 04/07/2022 5:36:35 PM By: Sharyn Creamer RN, BSN Signed: 04/11/2022 5:10:34 PM By: Blanche East RN Entered By: Blanche East on 04/07/2022 08:43:34 -------------------------------------------------------------------------------- Vitals Details Patient Name: Date of Service: [name], [place] 04/07/2022 8:45 A M Medical Record Number: [id number] Patient Account Number: [id number] Date of Birth/Sex: Treating RN: 08-18-1956 (66 y.o. Mare Ferrari Primary Care Faustine Tates: [name] Other Clinician: Referring Bethanny Toelle: Treating Redonna Wilbert/Extender: [name] in Treatment: 6 Vital Signs Time Taken: 08:33 Temperature (F): 98.0 Height (in): 72 Pulse (bpm): 60 Weight (lbs): 392 Respiratory Rate (breaths/min): 18 Body Mass Index (BMI): 53.2 Blood Pressure (mmHg): 106/59 Reference Range: 80 - 120 mg / dl Electronic Signature(s) Signed: 04/07/2022 5:36:35 PM By: Sharyn Creamer RN, BSN Entered By: Sharyn Creamer on 04/07/2022 08:36:01

## 2022-04-11 NOTE — Progress Notes (Signed)
ASAR, EVILSIZER ([id number]) Visit Report for 04/07/2022 Chief Complaint Document Details Patient Name: Date of Service: [name], [name] 04/07/2022 8:45 A M Medical Record Number: [id number] Patient Account Number: [id number] Date of Birth/Sex: Treating RN: 04/09/1956 (66 y.o. [name] Primary Care Provider: [name] Other Clinician: Referring Provider: Treating Provider/Extender: [name] in Treatment: 6 Information Obtained from: Patient Chief Complaint 11/09/2018; patient is here for review of wounds on his dorsal right first and second toes and on the dorsal left first toe. 02/22/2022: The patient is here for a new ulcer on his posterior left heel. Electronic Signature(s) Signed: 04/07/2022 8:57:01 AM By: Fredirick Maudlin MD FACS Entered By: Fredirick Maudlin on 04/07/2022 08:57:01 -------------------------------------------------------------------------------- Debridement Details Patient Name: Date of Service: [name], [name] 04/07/2022 8:45 A M Medical Record Number: [id number] Patient Account Number: [id number] Date of Birth/Sex: Treating RN: January 08, 1956 (66 y.o. [name] Primary Care Provider: [name] Other Clinician: Referring Provider: Treating Provider/Extender: [name] in Treatment: 6 Debridement Performed for Assessment: Wound #20 Left,Lateral Calcaneus Performed By: Physician Fredirick Maudlin, MD Debridement Type: Debridement Severity of Tissue Pre Debridement: Fat layer exposed Level of Consciousness (Pre-procedure): Awake and Alert Pre-procedure Verification/Time Out Yes - 08:46 Taken: Start Time: 08:50 Pain Control: Lidocaine 4% T opical Solution T Area Debrided (L x W): otal 0.5 (cm) x 1.1 (cm) = 0.55 (cm) Tissue and other material debrided: Non-Viable, Slough, Subcutaneous, Slough Level: Skin/Subcutaneous Tissue Debridement Description: Excisional Instrument:  Curette Bleeding: Minimum Hemostasis Achieved: Pressure Procedural Pain: 0 Post Procedural Pain: 0 Response to Treatment: Procedure was tolerated well Level of Consciousness (Post- Awake and Alert procedure): Post Debridement Measurements of Total Wound Length: (cm) 0.5 Width: (cm) 1.1 Depth: (cm) 0.1 Volume: (cm) 0.043 Character of Wound/Ulcer Post Debridement: Improved Severity of Tissue Post Debridement: Fat layer exposed Post Procedure Diagnosis Same as Pre-procedure Electronic Signature(s) Signed: 04/07/2022 12:47:40 PM By: Fredirick Maudlin MD FACS Signed: 04/07/2022 5:36:35 PM By: Sharyn Creamer RN, BSN Entered By: Sharyn Creamer on 04/07/2022 08:51:28 -------------------------------------------------------------------------------- HPI Details Patient Name: Date of Service: [name], [name] 04/07/2022 8:45 A M Medical Record Number: [id number] Patient Account Number: [id number] Date of Birth/Sex: Treating RN: 06-02-1956 (66 y.o. [name] Primary Care Provider: [name] Other Clinician: Referring Provider: Treating Provider/Extender: [name] in Treatment: 6 History of Present Illness HPI Description: READMISSION 11/09/2018 This is a now 66 year old man that we had in this clinic over a multitude of years but most recently in 2015. He had wounds on his plantar foot initially in 2007 and 2008 and I think subsequently was seen in 2012 then 2014 into the mid part of 2015 with wounds on his toes. He is a type II diabetic with peripheral neuropathy but no known PAD. The patient states he was doing well until about a month ago he noted an area on the left great toe which was superficial and then an area on the right great and second toes about a week later. He is not sure how this happened he simply noticed this when he was lying in bed at night. He has been using peroxide. He has not seen a doctor. He has not been on antibiotics and has  had no x-rays. Past medical history; type 2 diabetes with peripheral neuropathy, hypertension, hypothyroidism, low B12 levels, iron deficiency anemia, ventral hernia, diabetic nephropathy, chronic diastolic heart failure and obstructive sleep apnea. ABIs in this clinic were 1.2 on the right and 1.1 on  the left 2/28; patient arrived last week for areas on his left first right first and right second toes. X-ray of the right foot did not show any osseous abnormality. Culture of the right great toe showed Staphylococcus aureus methicillin sensitive and he is on dicloxacillin which he started 3 days ago. 3/6; patient with wounds on his right first and second toes and left first toes. The area over the left first toe is extensive we have been using silver alginate. Culture grew MSSA and he is on dicloxacillin which I prescribed on the 25th. He is still taking this which I am not really sure of the reason. He expressed knowledge that this was 4 times a day he should have been out of this around March 2 if he picked it up on the 25th or they gave him the right number of pills. Nevertheless our nurses report more purulent drainage and odor 3/17-Patient returns for the 3 wounds the left first toe and the right first and second toe dorsal surfaces. The left first toe is closed up and healed, the right first toe dorsal surface wound is extensive and appears the same as last time, no purulent drainage or odor reported per nurse, left second toe wound is slightly smaller. He has completed the dicloxacillin as of today. Noted that his culture previously had grown MSSA. We have been using calcium alginate to the wounds. X-rays have been reviewed. ABIs were reviewed as well. 3/27; patient returns for review of wounds on his right first and right second toes. The wound on the left first toe is healed. 4/10; 2-week follow-up. The area on the right second toe dorsally is healed. Per the patient the left first toe  remains healed. He still has an extensive area dorsally over the right first toe inner phalangeal joint area. Rolled up hyper granulated tissue with probably a nonviable surface 4/24; 2-week follow-up. The areas on the right second toe dorsally and right first toe dorsally is healed. The area on the dorsal left first toe over the inner phalangeal joint is smaller but still hyper granulated we have been using Hydrofera Blue 5/15; 3-week follow-up. The areas on the right first toe. Not the left as I said on 4/24. He is using Hydrofera Blue. 5/29; 3-week follow-up. The area on his dorsal right toe over the right inter phalangeal joint is just about closed. We have been using Hydrofera Blue. In passing he showed me his right thumb which is exquisitely tender over the tip of the digit. 6/5; the area on the dorsal right toe over the inner phalangeal joint is closed. Right thumb is better which I gave him antibiotics for last week. Still some tenderness but considerably improved READMISSION 02/22/2022 This is a 66 year old type II diabetic with congestive heart failure, end-stage renal disease on hemodialysis, peripheral artery disease, and morbid obesity. He has developed an ulcer on his left posterior heel that he believes is secondary to his health assistance not adequately moisturizing his feet. He says it first appeared about 2 weeks ago. He does not have sensation in his feet. He has not noticed any foul odor or drainage. His last hemoglobin A1c was 7.9 at the beginning of January. ABI in clinic today was 1.15. 03/09/2022: He has a new wound on his abdomen that he says started out as a lump and then the skin broke open when he bumped it on a portion of his hospital bed at home. There is fat exposed. The wound on his  heel is a little bit smaller but still has a very pale surface. There is periwound callus accumulation. 03/30/2022: He had another small wound opened up on his abdomen. He says that he  thinks these are starting as blisters. He has been on Mounjaro for weight loss and it looks like the loose skin resulting from losing weight might be rubbing and causing small friction injuries. The wound that I treated with chemical cauterization last visit is much smaller; the new wound has a very similar appearance to this. His heel ulcer is smaller with just a little bit of slough buildup on the surface. He continues to have fairly impressive periwound callus formation. 04/07/2022: Both abdominal wounds are smaller today and there is less hypertrophic granulation tissue protruding. The wound on his heel also continues to contract. There is just a layer of slough on the surface. No concern for infection. Electronic Signature(s) Signed: 04/07/2022 8:57:54 AM By: Fredirick Maudlin MD FACS Entered By: Fredirick Maudlin on 04/07/2022 08:57:53 -------------------------------------------------------------------------------- Chemical Cauterization Details Patient Name: Date of Service: [name], [name] 04/07/2022 8:45 A M Medical Record Number: [id number] Patient Account Number: [id number] Date of Birth/Sex: Treating RN: 06/20/1956 (66 y.o. [name] Primary Care Provider: [name] Other Clinician: Referring Provider: Treating Provider/Extender: [name] in Treatment: 6 Procedure Performed for: Wound #22 Distal Abdomen - midline Performed By: Physician Fredirick Maudlin, MD Post Procedure Diagnosis Same as Pre-procedure Electronic Signature(s) Signed: 04/07/2022 12:47:40 PM By: Fredirick Maudlin MD FACS Signed: 04/07/2022 5:36:35 PM By: Sharyn Creamer RN, BSN Entered By: Sharyn Creamer on 04/07/2022 08:49:14 -------------------------------------------------------------------------------- Physical Exam Details Patient Name: Date of Service: [name], [name] 04/07/2022 8:45 A M Medical Record Number: [id number] Patient Account Number: [id number] Date of  Birth/Sex: Treating RN: 05/04/1956 (66 y.o. [name] Primary Care Provider: [name] Other Clinician: Referring Provider: Treating Provider/Extender: [name] in Treatment: 6 Constitutional . . . . No acute distress.Marland Kitchen Respiratory Normal work of breathing on room air.. Notes 04/07/2022: Both abdominal wounds are smaller today and there is less hypertrophic granulation tissue protruding. The wound on his heel also continues to contract. There is just a layer of slough on the surface. No concern for infection. Electronic Signature(s) Signed: 04/07/2022 8:58:29 AM By: Fredirick Maudlin MD FACS Entered By: Fredirick Maudlin on 04/07/2022 08:58:29 -------------------------------------------------------------------------------- Physician Orders Details Patient Name: Date of Service: [name], [name] 04/07/2022 8:45 A M Medical Record Number: [id number] Patient Account Number: [id number] Date of Birth/Sex: Treating RN: 02-Jan-1956 (66 y.o. 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 - Dr Celine Ahr Room 3 Return A Bathing/ Shower/ Hygiene Other Bathing/Shower/Hygiene Orders/Instructions: - change wound dressing after bathing Off-Loading Wound #20 Left,Lateral Calcaneus Other: - When in bed place a couple of pillows behind the left calf to elevate  heel off the bed [place] please change wound dressing 2 times a week. Wound Treatment Wound #20 - Calcaneus Wound Laterality: Left, Lateral Cleanser: Soap and Water  3 x Per Week/30 Days Discharge Instructions: May shower and wash wound with dial antibacterial soap and water prior to dressing change. Cleanser: Wound Cleanser 3 x Per Week/30 Days Discharge Instructions: Cleanse the wound with wound cleanser prior to applying a clean dressing using gauze sponges, not tissue or cotton balls. Topical: Gentamicin 3 x Per Week/30 Days Discharge Instructions: As directed by physician Prim Dressing: Hydrofera Blue Classic Foam, 2x2 in 3 x Per Week/30 Days ary Discharge Instructions: Moisten with saline prior to applying to wound bed Secondary Dressing: Woven Gauze Sponge, Non-Sterile 4x4 in 3 x Per Week/30 Days Discharge Instructions: Apply over primary dressing as directed. Secured With: Elastic Bandage 4 inch (ACE bandage) 3 x Per Week/30 Days Discharge Instructions: Secure with ACE bandage as directed. Secured With: [employer], 4.5x3.1 (in/yd) 3 x Per Week/30 Days Discharge Instructions: Secure with Kerlix as directed. Secured With: Paper Tape, 2x10 (in/yd) 3 x Per Week/30 Days Discharge Instructions: Secure dressing with tape as directed. Wound #21 - Abdomen - midline Wound Laterality: Right, Proximal Cleanser: Soap and Water 3 x Per Week/30 Days Discharge Instructions: May shower and wash wound with dial antibacterial soap and water prior to dressing change. Cleanser: Wound Cleanser 3 x Per Week/30 Days Discharge Instructions: Cleanse the wound with wound cleanser prior to applying a clean dressing using gauze sponges, not tissue or cotton balls. Prim Dressing: KerraCel Ag Gelling Fiber Dressing, 2x2 in (silver alginate) 3 x Per Week/30 Days ary Discharge Instructions: Apply silver alginate to wound bed as  instructed Secondary Dressing: Bordered Gauze, 2x2 in 3 x Per Week/30 Days Discharge Instructions: Apply over primary dressing as directed. Secondary Dressing: Woven Gauze Sponge, Non-Sterile 4x4 in 3 x Per Week/30 Days Discharge Instructions: Apply over primary dressing as directed. Secured With: Paper Tape, 2x10 (in/yd) 3 x Per Week/30 Days Discharge Instructions: Secure dressing with tape as directed. Wound #22 - Abdomen - midline Wound Laterality: Distal Cleanser: Soap and Water 3 x Per Week/30 Days Discharge Instructions: May shower and wash wound with dial antibacterial soap and water prior to dressing change. Cleanser: Wound Cleanser 3 x Per Week/30 Days Discharge Instructions: Cleanse the wound with wound cleanser prior to applying a clean dressing using gauze sponges, not tissue or cotton balls. Prim Dressing: KerraCel Ag Gelling Fiber Dressing, 2x2 in (silver alginate) 3 x Per Week/30 Days ary Discharge Instructions: Apply silver alginate to wound bed as instructed Secondary Dressing: Bordered Gauze, 2x2 in 3 x Per Week/30 Days Discharge Instructions: Apply over primary dressing as directed. Secondary Dressing: Woven Gauze Sponge, Non-Sterile 4x4 in 3 x Per Week/30 Days Discharge Instructions: Apply over primary dressing as directed. Secured With: Paper Tape, 2x10 (in/yd) 3 x Per Week/30 Days Discharge Instructions: Secure dressing with tape as directed. Electronic Signature(s) Signed: 04/07/2022 12:47:40 PM By: Fredirick Maudlin MD FACS Entered By: Fredirick Maudlin on 04/07/2022 08:58:53 -------------------------------------------------------------------------------- Problem List Details Patient Name: Date of Service: [name], [name] 04/07/2022 8:45 A M Medical Record Number: [id number] Patient Account Number: [id number] Date of Birth/Sex: Treating RN: 11/24/1955 (66 y.o. [name] Primary Care Provider: [name] Other Clinician: Referring Provider: Treating  Provider/Extender: [name] in Treatment: 6 Active Problems ICD-10 Encounter Code Description Active Date MDM Diagnosis [phone] Non-pressure chronic ulcer of left heel and midfoot with fat layer exposed 02/22/2022 No Yes E11.621 Type 2 diabetes mellitus with foot ulcer 02/22/2022 No Yes N18.6 End stage renal disease 02/22/2022 No Yes I50.9 Heart failure, unspecified 02/22/2022 No Yes I10  Essential (primary) hypertension 02/22/2022 No Yes I73.9 Peripheral vascular disease, unspecified 02/22/2022 No Yes L98.492 Non-pressure chronic ulcer of skin of other sites with fat layer exposed 03/09/2022 No Yes Inactive Problems Resolved Problems Electronic Signature(s) Signed: 04/07/2022 8:56:45 AM By: Fredirick Maudlin MD FACS Entered By: Fredirick Maudlin on 04/07/2022 08:56:44 -------------------------------------------------------------------------------- Progress Note Details Patient Name: Date of Service: [name], [name] 04/07/2022 8:45 A M Medical Record Number: [id number] Patient Account Number: [id number] Date of Birth/Sex: Treating RN: 24-Aug-1956 (66 y.o. [name] Primary Care Provider: [name] Other Clinician: Referring Provider: Treating Provider/Extender: [name] in Treatment: 6 Subjective Chief Complaint Information obtained from Patient 11/09/2018; patient is here for review of wounds on his dorsal right first and second toes and on the dorsal left first toe. 02/22/2022: The patient is here for a new ulcer on his posterior left heel. History of Present Illness (HPI) READMISSION 11/09/2018 This is a now 66 year old man that we had in this clinic over a multitude of years but most recently in 2015. He had wounds on his plantar foot initially in 2007 and 2008 and I think subsequently was seen in 2012 then 2014 into the mid part of 2015 with wounds on his toes. He is a type II diabetic with peripheral neuropathy but no  known PAD. The patient states he was doing well until about a month ago he noted an area on the left great toe which was superficial and then an area on the right great and second toes about a week later. He is not sure how this happened he simply noticed this when he was lying in bed at night. He has been using peroxide. He has not seen a doctor. He has not been on antibiotics and has had no x-rays. Past medical history; type 2 diabetes with peripheral neuropathy, hypertension, hypothyroidism, low B12 levels, iron deficiency anemia, ventral hernia, diabetic nephropathy, chronic diastolic heart failure and obstructive sleep apnea. ABIs in this clinic were 1.2 on the right and 1.1 on the left 2/28; patient arrived last week for areas on his left first right first and right second toes. X-ray of the right foot did not show any osseous abnormality. Culture of the right great toe showed Staphylococcus aureus methicillin sensitive and he is on dicloxacillin which he started 3 days ago. 3/6; patient with wounds on his right first and second toes and left first toes. The area over the left first toe is extensive we have been using silver alginate. Culture grew MSSA and he is on dicloxacillin which I prescribed on the 25th. He is still taking this which I am not really sure of the reason. He expressed knowledge that this was 4 times a day he should have been out of this around March 2 if he picked it up on the 25th or they gave him the right number of pills. Nevertheless our nurses report more purulent drainage and odor 3/17-Patient returns for the 3 wounds the left first toe and the right first and second toe dorsal surfaces. The left first toe is closed up and healed, the right first toe dorsal surface wound is extensive and appears the same as last time, no purulent drainage or odor reported per nurse, left second toe wound is slightly smaller. He has completed the dicloxacillin as of today. Noted that  his culture previously had grown MSSA. We have been using calcium alginate to the wounds. X-rays have been reviewed. ABIs were reviewed as well. 3/27; patient returns  for review of wounds on his right first and right second toes. The wound on the left first toe is healed. 4/10; 2-week follow-up. The area on the right second toe dorsally is healed. Per the patient the left first toe remains healed. He still has an extensive area dorsally over the right first toe inner phalangeal joint area. Rolled up hyper granulated tissue with probably a nonviable surface 4/24; 2-week follow-up. The areas on the right second toe dorsally and right first toe dorsally is healed. The area on the dorsal left first toe over the inner phalangeal joint is smaller but still hyper granulated we have been using Hydrofera Blue 5/15; 3-week follow-up. The areas on the right first toe. Not the left as I said on 4/24. He is using Hydrofera Blue. 5/29; 3-week follow-up. The area on his dorsal right toe over the right inter phalangeal joint is just about closed. We have been using Hydrofera Blue. In passing he showed me his right thumb which is exquisitely tender over the tip of the digit. 6/5; the area on the dorsal right toe over the inner phalangeal joint is closed. Right thumb is better which I gave him antibiotics for last week. Still some tenderness but considerably improved READMISSION 02/22/2022 This is a 66 year old type II diabetic with congestive heart failure, end-stage renal disease on hemodialysis, peripheral artery disease, and morbid obesity. He has developed an ulcer on his left posterior heel that he believes is secondary to his health assistance not adequately moisturizing his feet. He says it first appeared about 2 weeks ago. He does not have sensation in his feet. He has not noticed any foul odor or drainage. His last hemoglobin A1c was 7.9 at the beginning of January. ABI in clinic today was 1.15. 03/09/2022:  He has a new wound on his abdomen that he says started out as a lump and then the skin broke open when he bumped it on a portion of his hospital bed at home. There is fat exposed. The wound on his heel is a little bit smaller but still has a very pale surface. There is periwound callus accumulation. 03/30/2022: He had another small wound opened up on his abdomen. He says that he thinks these are starting as blisters. He has been on Mounjaro for weight loss and it looks like the loose skin resulting from losing weight might be rubbing and causing small friction injuries. The wound that I treated with chemical cauterization last visit is much smaller; the new wound has a very similar appearance to this. His heel ulcer is smaller with just a little bit of slough buildup on the surface. He continues to have fairly impressive periwound callus formation. 04/07/2022: Both abdominal wounds are smaller today and there is less hypertrophic granulation tissue protruding. The wound on his heel also continues to contract. There is just a layer of slough on the surface. No concern for infection. Patient History Information obtained from Patient. Family History Cancer - Mother,Father, Diabetes - Mother,Father,Siblings, Heart Disease - Siblings, Lung Disease - Mother, No family history of Hereditary Spherocytosis, Hypertension, Kidney Disease, Seizures, Stroke, Thyroid Problems, Tuberculosis. Social History Never smoker, Marital Status - Single, Alcohol Use - Never, Drug Use - No History, Caffeine Use - Daily - Coffee. Medical History Eyes Denies history of Cataracts, Glaucoma, Optic Neuritis Ear/Nose/Mouth/Throat Denies history of Chronic sinus problems/congestion, Middle ear problems Hematologic/Lymphatic Denies history of Anemia, Hemophilia, Human Immunodeficiency Virus, Lymphedema, Sickle Cell Disease Respiratory Patient has history of Sleep Apnea Denies  history of Aspiration, Asthma, Chronic Obstructive  Pulmonary Disease (COPD), Pneumothorax, Tuberculosis Cardiovascular Patient has history of Congestive Heart Failure, Hypertension Denies history of Angina, Arrhythmia, Coronary Artery Disease, Deep Vein Thrombosis, Hypotension, Myocardial Infarction, Peripheral Arterial Disease, Peripheral Venous Disease, Phlebitis, Vasculitis Gastrointestinal Denies history of Cirrhosis , Colitis, Crohnoos, Hepatitis A, Hepatitis B, Hepatitis C Endocrine Patient has history of Type II Diabetes Denies history of Type I Diabetes Genitourinary Denies history of End Stage Renal Disease Immunological Denies history of Lupus Erythematosus, Raynaudoos, Scleroderma Integumentary (Skin) Denies history of History of Burn Musculoskeletal Patient has history of Osteoarthritis Denies history of Gout, Rheumatoid Arthritis, Osteomyelitis Neurologic Patient has history of Neuropathy - Diabetic Denies history of Dementia, Quadriplegia, Paraplegia, Seizure Disorder Oncologic Denies history of Received Chemotherapy, Received Radiation Psychiatric Denies history of Anorexia/bulimia, Confinement Anxiety Objective Constitutional No acute distress.. Vitals Time Taken: 8:33 AM, Height: 72 in, Weight: 392 lbs, BMI: 53.2, Temperature: 98.0 F, Pulse: 60 bpm, Respiratory Rate: 18 breaths/min, Blood Pressure: 106/59 mmHg. Respiratory Normal work of breathing on room air.. General Notes: 04/07/2022: Both abdominal wounds are smaller today and there is less hypertrophic granulation tissue protruding. The wound on his heel also continues to contract. There is just a layer of slough on the surface. No concern for infection. Integumentary (Hair, Skin) Wound #20 status is Open. Original cause of wound was Gradually Appeared. The date acquired was: 02/08/2022. The wound has been in treatment 6 weeks. The wound is located on the Left,Lateral Calcaneus. The wound measures 0.5cm length x 1.1cm width x 0.1cm depth; 0.432cm^2 area  and 0.043cm^3 volume. There is Fat Layer (Subcutaneous Tissue) exposed. There is no tunneling or undermining noted. There is a medium amount of serosanguineous drainage noted. There is medium (34-66%) pink, pale granulation within the wound bed. There is a medium (34-66%) amount of necrotic tissue within the wound bed including Adherent Slough. Wound #21 status is Open. Original cause of wound was Blister. The date acquired was: 03/03/2022. The wound has been in treatment 4 weeks. The wound is located on the Right,Proximal Abdomen - midline. The wound measures 0.2cm length x 0.2cm width x 0.1cm depth; 0.031cm^2 area and 0.003cm^3 volume. There is Fat Layer (Subcutaneous Tissue) exposed. There is no tunneling or undermining noted. There is a medium amount of serosanguineous drainage noted. There is large (67-100%) red granulation within the wound bed. There is no necrotic tissue within the wound bed. Wound #22 status is Open. Original cause of wound was Blister. The date acquired was: 03/23/2022. The wound has been in treatment 1 weeks. The wound is located on the Distal Abdomen - midline. The wound measures 0.3cm length x 0.3cm width x 0.1cm depth; 0.071cm^2 area and 0.007cm^3 volume. There is Fat Layer (Subcutaneous Tissue) exposed. There is no tunneling or undermining noted. There is a medium amount of serosanguineous drainage noted. There is large (67-100%) red, hyper - granulation within the wound bed. There is no necrotic tissue within the wound bed. Assessment Active Problems ICD-10 Non-pressure chronic ulcer of left heel and midfoot with fat layer exposed Type 2 diabetes mellitus with foot ulcer End stage renal disease Heart failure, unspecified Essential (primary) hypertension Peripheral vascular disease, unspecified Non-pressure chronic ulcer of skin of other sites with fat layer exposed Procedures Wound #20 Pre-procedure diagnosis of Wound #20 is a Diabetic Wound/Ulcer of the Lower  Extremity located on the Left,Lateral Calcaneus .Severity of Tissue Pre Debridement is: Fat layer exposed. There was a Excisional Skin/Subcutaneous Tissue Debridement with a total area of  0.55 sq cm performed by Fredirick Maudlin, MD. With the following instrument(s): Curette to remove Non-Viable tissue/material. Material removed includes Subcutaneous Tissue and Slough and after achieving pain control using Lidocaine 4% Topical Solution. No specimens were taken. A time out was conducted at 08:46, prior to the start of the procedure. A Minimum amount of bleeding was controlled with Pressure. The procedure was tolerated well with a pain level of 0 throughout and a pain level of 0 following the procedure. Post Debridement Measurements: 0.5cm length x 1.1cm width x 0.1cm depth; 0.043cm^3 volume. Character of Wound/Ulcer Post Debridement is improved. Severity of Tissue Post Debridement is: Fat layer exposed. Post procedure Diagnosis Wound #20: Same as Pre-Procedure Wound #22 Pre-procedure diagnosis of Wound #22 is a Lesion located on the Distal Abdomen - midline . An Chemical Cauterization procedure was performed by Fredirick Maudlin, MD. Post procedure Diagnosis Wound #22: Same as Pre-Procedure Plan Follow-up Appointments: Return Appointment in 1 week. - Dr Celine Ahr Room 3 Bathing/ Shower/ Hygiene: Other Bathing/Shower/Hygiene Orders/Instructions: - change wound dressing after bathing Off-Loading: Wound #20 Left,Lateral Calcaneus: Other: - When in bed place a couple of pillows behind the left calf to elevate heel off the bed Home Health: Other Home Health Orders/Instructions: - Home Health please change wound dressing 2 times a week. WOUND #20: - Calcaneus Wound Laterality: Left, Lateral Cleanser: Soap and Water 3 x Per Week/30 Days Discharge Instructions: May shower and wash wound with dial antibacterial soap and water prior to dressing change. Cleanser: Wound Cleanser 3 x Per Week/30  Days Discharge Instructions: Cleanse the wound with wound cleanser prior to applying a clean dressing using gauze sponges, not tissue or cotton balls. Topical: Gentamicin 3 x Per Week/30 Days Discharge Instructions: As directed by physician Prim Dressing: Hydrofera Blue Classic Foam, 2x2 in 3 x Per Week/30 Days ary Discharge Instructions: Moisten with saline prior to applying to wound bed Secondary Dressing: Woven Gauze Sponge, Non-Sterile 4x4 in 3 x Per Week/30 Days Discharge Instructions: Apply over primary dressing as directed. Secured With: Elastic Bandage 4 inch (ACE bandage) 3 x Per Week/30 Days Discharge Instructions: Secure with ACE bandage as directed. Secured With: [employer], 4.5x3.1 (in/yd) 3 x Per Week/30 Days Discharge Instructions: Secure with Kerlix as directed. Secured With: Paper T ape, 2x10 (in/yd) 3 x Per Week/30 Days Discharge Instructions: Secure dressing with tape as directed. WOUND #21: - Abdomen - midline Wound Laterality: Right, Proximal Cleanser: Soap and Water 3 x Per Week/30 Days Discharge Instructions: May shower and wash wound with dial antibacterial soap and water prior to dressing change. Cleanser: Wound Cleanser 3 x Per Week/30 Days Discharge Instructions: Cleanse the wound with wound cleanser prior to applying a clean dressing using gauze sponges, not tissue or cotton balls. Prim Dressing: KerraCel Ag Gelling Fiber Dressing, 2x2 in (silver alginate) 3 x Per Week/30 Days ary Discharge Instructions: Apply silver alginate to wound bed as instructed Secondary Dressing: Bordered Gauze, 2x2 in 3 x Per Week/30 Days Discharge Instructions: Apply over primary dressing as directed. Secondary Dressing: Woven Gauze Sponge, Non-Sterile 4x4 in 3 x Per Week/30 Days Discharge Instructions: Apply over primary dressing as directed. Secured With: Paper T ape, 2x10 (in/yd) 3 x Per Week/30 Days Discharge Instructions: Secure dressing with tape as directed. WOUND  #22: - Abdomen - midline Wound Laterality: Distal Cleanser: Soap and Water 3 x Per Week/30 Days Discharge Instructions: May shower and wash wound with dial antibacterial soap and water prior to dressing change. Cleanser: Wound Cleanser 3 x  Per Week/30 Days Discharge Instructions: Cleanse the wound with wound cleanser prior to applying a clean dressing using gauze sponges, not tissue or cotton balls. Prim Dressing: KerraCel Ag Gelling Fiber Dressing, 2x2 in (silver alginate) 3 x Per Week/30 Days ary Discharge Instructions: Apply silver alginate to wound bed as instructed Secondary Dressing: Bordered Gauze, 2x2 in 3 x Per Week/30 Days Discharge Instructions: Apply over primary dressing as directed. Secondary Dressing: Woven Gauze Sponge, Non-Sterile 4x4 in 3 x Per Week/30 Days Discharge Instructions: Apply over primary dressing as directed. Secured With: Paper T ape, 2x10 (in/yd) 3 x Per Week/30 Days Discharge Instructions: Secure dressing with tape as directed. 04/07/2022: Both abdominal wounds are smaller today and there is less hypertrophic granulation tissue protruding. The wound on his heel also continues to contract. There is just a layer of slough on the surface. No concern for infection. I used silver nitrate to chemically cauterize the hypertrophic granulation tissue on the 2 small abdominal wounds. I used a curette to debride slough and nonviable subcutaneous tissue from the calcaneal wound. We will continue using silver alginate on the abdominal wounds and Hydrofera Blue on the heel wound. Follow-up in 1 week. Electronic Signature(s) Signed: 04/07/2022 8:59:35 AM By: Fredirick Maudlin MD FACS Entered By: Fredirick Maudlin on 04/07/2022 08:59:34 -------------------------------------------------------------------------------- HxROS Details Patient Name: Date of Service: [name], [name] 04/07/2022 8:45 A M Medical Record Number: [id number] Patient Account Number: [id number] Date of  Birth/Sex: Treating RN: May 23, 1956 (66 y.o. [name] Primary Care Provider: [name] Other Clinician: Referring Provider: Treating Provider/Extender: [name] in Treatment: 6 Information Obtained From Patient Eyes Medical History: Negative for: Cataracts; Glaucoma; Optic Neuritis Ear/Nose/Mouth/Throat Medical History: Negative for: Chronic sinus problems/congestion; Middle ear problems Hematologic/Lymphatic Medical History: Negative for: Anemia; Hemophilia; Human Immunodeficiency Virus; Lymphedema; Sickle Cell Disease Respiratory Medical History: Positive for: Sleep Apnea Negative for: Aspiration; Asthma; Chronic Obstructive Pulmonary Disease (COPD); Pneumothorax; Tuberculosis Cardiovascular Medical History: Positive for: Congestive Heart Failure; Hypertension Negative for: Angina; Arrhythmia; Coronary Artery Disease; Deep Vein Thrombosis; Hypotension; Myocardial Infarction; Peripheral Arterial Disease; Peripheral Venous Disease; Phlebitis; Vasculitis Gastrointestinal Medical History: Negative for: Cirrhosis ; Colitis; Crohns; Hepatitis A; Hepatitis B; Hepatitis C Endocrine Medical History: Positive for: Type II Diabetes Negative for: Type I Diabetes Time with diabetes: 1994 Treated with: Insulin Blood sugar tested every day: No Genitourinary Medical History: Negative for: End Stage Renal Disease Immunological Medical History: Negative for: Lupus Erythematosus; Raynauds; Scleroderma Integumentary (Skin) Medical History: Negative for: History of Burn Musculoskeletal Medical History: Positive for: Osteoarthritis Negative for: Gout; Rheumatoid Arthritis; Osteomyelitis Neurologic Medical History: Positive for: Neuropathy - Diabetic Negative for: Dementia; Quadriplegia; Paraplegia; Seizure Disorder Oncologic Medical History: Negative for: Received Chemotherapy; Received Radiation Psychiatric Medical History: Negative  for: Anorexia/bulimia; Confinement Anxiety Immunizations Pneumococcal Vaccine: Received Pneumococcal Vaccination: No Implantable Devices No devices added Family and Social History Cancer: Yes - Mother,Father; Diabetes: Yes - Mother,Father,Siblings; Heart Disease: Yes - Siblings; Hereditary Spherocytosis: No; Hypertension: No; Kidney Disease: No; Lung Disease: Yes - Mother; Seizures: No; Stroke: No; Thyroid Problems: No; Tuberculosis: No; Never smoker; Marital Status - Single; Alcohol Use: Never; Drug Use: No History; Caffeine Use: Daily - Coffee; Financial Concerns: No; Food, Clothing or Shelter Needs: No; Support System Lacking: No; Transportation Concerns: No Electronic Signature(s) Signed: 04/07/2022 12:47:40 PM By: Fredirick Maudlin MD FACS Signed: 04/11/2022 12:55:32 PM By: [name] RN Entered By: Fredirick Maudlin on 04/07/2022 08:57:59 -------------------------------------------------------------------------------- [place] Details Patient Name: Date of Service: [name], [name] 04/07/2022 Medical Record Number:  [id number] Patient Account Number: [id number] Date of Birth/Sex: Treating RN: 11-06-1955 (66 y.o. [name] Primary Care Provider: [name] Other Clinician: Referring Provider: Treating Provider/Extender: [name] in Treatment: 6 Diagnosis Coding ICD-10 Codes Code Description [phone] Non-pressure chronic ulcer of left heel and midfoot with fat layer exposed E11.621 Type 2 diabetes mellitus with foot ulcer N18.6 End stage renal disease I50.9 Heart failure, unspecified I10 Essential (primary) hypertension I73.9 Peripheral vascular disease, unspecified L98.492 Non-pressure chronic ulcer of skin of other sites with fat layer exposed Facility Procedures CPT4 Code: [id number] Description: [place] - DEB SUBQ TISSUE 20 SQ CM/< ICD-10 Diagnosis Description L97.422 Non-pressure chronic ulcer of left heel and midfoot with fat layer  exposed Modifier: Quantity: 1 CPT4 Code: [id number] Description: 01779 - CHEM CAUT GRANULATION TISS ICD-10 Diagnosis Description L98.492 Non-pressure chronic ulcer of skin of other sites with fat layer exposed Modifier: Quantity: 1 Physician Procedures : CPT4 Code Description Modifier [id number] - WC PHYS LEVEL 3 - EST PT 25 ICD-10 Diagnosis Description L97.422 Non-pressure chronic ulcer of left heel and midfoot with fat layer exposed E11.621 Type 2 diabetes mellitus with foot ulcer L98.492  Non-pressure chronic ulcer of skin of other sites with fat layer exposed I50.9 Heart failure, unspecified Quantity: 1 : [id number] - WC PHYS SUBQ TISS 20 SQ CM ICD-10 Diagnosis Description L97.422 Non-pressure chronic ulcer of left heel and midfoot with fat layer exposed Quantity: 1 : [id number] - WC PHYS CHEM CAUT GRAN TISSUE ICD-10 Diagnosis Description L98.492 Non-pressure chronic ulcer of skin of other sites with fat layer exposed Quantity: 1 Electronic Signature(s) Signed: 04/07/2022 9:00:20 AM By: Fredirick Maudlin MD FACS Entered By: Fredirick Maudlin on 04/07/2022 09:00:19

## 2022-04-18 ENCOUNTER — Encounter (HOSPITAL_BASED_OUTPATIENT_CLINIC_OR_DEPARTMENT_OTHER): Payer: Medicare Other | Admitting: General Surgery

## 2022-04-26 ENCOUNTER — Encounter (HOSPITAL_BASED_OUTPATIENT_CLINIC_OR_DEPARTMENT_OTHER): Payer: Medicare Other | Admitting: General Surgery

## 2022-04-29 ENCOUNTER — Encounter (HOSPITAL_BASED_OUTPATIENT_CLINIC_OR_DEPARTMENT_OTHER): Payer: Medicare Other | Attending: General Surgery | Admitting: General Surgery

## 2022-04-29 DIAGNOSIS — I5032 Chronic diastolic (congestive) heart failure: Secondary | ICD-10-CM | POA: Insufficient documentation

## 2022-04-29 DIAGNOSIS — L98492 Non-pressure chronic ulcer of skin of other sites with fat layer exposed: Secondary | ICD-10-CM | POA: Diagnosis not present

## 2022-04-29 DIAGNOSIS — E11621 Type 2 diabetes mellitus with foot ulcer: Secondary | ICD-10-CM | POA: Insufficient documentation

## 2022-04-29 DIAGNOSIS — I132 Hypertensive heart and chronic kidney disease with heart failure and with stage 5 chronic kidney disease, or end stage renal disease: Secondary | ICD-10-CM | POA: Diagnosis not present

## 2022-04-29 DIAGNOSIS — L97422 Non-pressure chronic ulcer of left heel and midfoot with fat layer exposed: Secondary | ICD-10-CM | POA: Diagnosis not present

## 2022-04-29 DIAGNOSIS — G4733 Obstructive sleep apnea (adult) (pediatric): Secondary | ICD-10-CM | POA: Diagnosis not present

## 2022-04-29 DIAGNOSIS — E1142 Type 2 diabetes mellitus with diabetic polyneuropathy: Secondary | ICD-10-CM | POA: Insufficient documentation

## 2022-04-29 DIAGNOSIS — N186 End stage renal disease: Secondary | ICD-10-CM | POA: Diagnosis not present

## 2022-04-29 DIAGNOSIS — Z992 Dependence on renal dialysis: Secondary | ICD-10-CM | POA: Diagnosis not present

## 2022-04-29 DIAGNOSIS — D631 Anemia in chronic kidney disease: Secondary | ICD-10-CM | POA: Diagnosis not present

## 2022-04-29 DIAGNOSIS — E1151 Type 2 diabetes mellitus with diabetic peripheral angiopathy without gangrene: Secondary | ICD-10-CM | POA: Insufficient documentation

## 2022-04-29 DIAGNOSIS — E1122 Type 2 diabetes mellitus with diabetic chronic kidney disease: Secondary | ICD-10-CM | POA: Insufficient documentation

## 2022-04-29 NOTE — Progress Notes (Signed)
AYUB, KIRSH ([id number]) Visit Report for 04/29/2022 Chief Complaint Document Details Patient Name: Date of Service: [name], [name] 04/29/2022 11:00 A M Medical Record Number: [id number] Patient Account Number: [id number] Date of Birth/Sex: Treating RN: Feb 09, 1956 (66 y.o. [name] Primary Care Provider: [name] Other Clinician: Referring Provider: Treating Provider/Extender: [name] in Treatment: 9 Information Obtained from: Patient Chief Complaint 11/09/2018; patient is here for review of wounds on his dorsal right first and second toes and on the dorsal left first toe. 02/22/2022: The patient is here for a new ulcer on his posterior left heel. Electronic Signature(s) Signed: 04/29/2022 11:29:42 AM By: Fredirick Maudlin MD FACS Entered By: Fredirick Maudlin on 04/29/2022 11:29:42 -------------------------------------------------------------------------------- Debridement Details Patient Name: Date of Service: [name], [name] 04/29/2022 11:00 A M Medical Record Number: [id number] Patient Account Number: [id number] Date of Birth/Sex: Treating RN: 02-Jan-1956 (66 y.o. [name] Primary Care Provider: [name] Other Clinician: Referring Provider: Treating Provider/Extender: [name] in Treatment: 9 Debridement Performed for Assessment: Wound #20 Left,Lateral Calcaneus Performed By: Physician Fredirick Maudlin, MD Debridement Type: Debridement Severity of Tissue Pre Debridement: Fat layer exposed Level of Consciousness (Pre-procedure): Awake and Alert Pre-procedure Verification/Time Out Yes - 11:20 Taken: Start Time: 11:20 T Area Debrided (L x W): otal 0.1 (cm) x 0.1 (cm) = 0.01 (cm) Tissue and other material debrided: Non-Viable, Callus Level: Non-Viable Tissue Debridement Description: Selective/Open Wound Instrument: Curette Bleeding: Minimum Hemostasis Achieved: Pressure End Time:  11:21 Procedural Pain: 0 Post Procedural Pain: 0 Response to Treatment: Procedure was tolerated well Level of Consciousness (Post- Awake and Alert procedure): Post Debridement Measurements of Total Wound Length: (cm) 0.1 Width: (cm) 0.1 Depth: (cm) 0.1 Volume: (cm) 0.001 Character of Wound/Ulcer Post Debridement: Improved Severity of Tissue Post Debridement: Fat layer exposed Post Procedure Diagnosis Same as Pre-procedure Electronic Signature(s) Signed: 04/29/2022 12:35:55 PM By: Fredirick Maudlin MD FACS Signed: 04/29/2022 4:17:40 PM By: [name] RN Entered By: [name] on 04/29/2022 11:25:06 -------------------------------------------------------------------------------- HPI Details Patient Name: Date of Service: [name], [name] 04/29/2022 11:00 A M Medical Record Number: [id number] Patient Account Number: [id number] Date of Birth/Sex: Treating RN: 01/03/1956 (66 y.o. [name] Primary Care Provider: [name] Other Clinician: Referring Provider: Treating Provider/Extender: [name] in Treatment: 9 History of Present Illness HPI Description: READMISSION 11/09/2018 This is a now 66 year old man that we had in this clinic over a multitude of years but most recently in 2015. He had wounds on his plantar foot initially in 2007 and 2008 and I think subsequently was seen in 2012 then 2014 into the mid part of 2015 with wounds on his toes. He is a type II diabetic with peripheral neuropathy but no known PAD. The patient states he was doing well until about a month ago he noted an area on the left great toe which was superficial and then an area on the right great and second toes about a week later. He is not sure how this happened he simply noticed this when he was lying in bed at night. He has been using peroxide. He has not seen a doctor. He has not been on antibiotics and has had no x-rays. Past medical history; type 2 diabetes  with peripheral neuropathy, hypertension, hypothyroidism, low B12 levels, iron deficiency anemia, ventral hernia, diabetic nephropathy, chronic diastolic heart failure and obstructive sleep apnea. ABIs in this clinic were 1.2 on the right and 1.1 on the left 2/28; patient arrived last  week for areas on his left first right first and right second toes. X-ray of the right foot did not show any osseous abnormality. Culture of the right great toe showed Staphylococcus aureus methicillin sensitive and he is on dicloxacillin which he started 3 days ago. 3/6; patient with wounds on his right first and second toes and left first toes. The area over the left first toe is extensive we have been using silver alginate. Culture grew MSSA and he is on dicloxacillin which I prescribed on the 25th. He is still taking this which I am not really sure of the reason. He expressed knowledge that this was 4 times a day he should have been out of this around March 2 if he picked it up on the 25th or they gave him the right number of pills. Nevertheless our nurses report more purulent drainage and odor 3/17-Patient returns for the 3 wounds the left first toe and the right first and second toe dorsal surfaces. The left first toe is closed up and healed, the right first toe dorsal surface wound is extensive and appears the same as last time, no purulent drainage or odor reported per nurse, left second toe wound is slightly smaller. He has completed the dicloxacillin as of today. Noted that his culture previously had grown MSSA. We have been using calcium alginate to the wounds. X-rays have been reviewed. ABIs were reviewed as well. 3/27; patient returns for review of wounds on his right first and right second toes. The wound on the left first toe is healed. 4/10; 2-week follow-up. The area on the right second toe dorsally is healed. Per the patient the left first toe remains healed. He still has an extensive area dorsally  over the right first toe inner phalangeal joint area. Rolled up hyper granulated tissue with probably a nonviable surface 4/24; 2-week follow-up. The areas on the right second toe dorsally and right first toe dorsally is healed. The area on the dorsal left first toe over the inner phalangeal joint is smaller but still hyper granulated we have been using Hydrofera Blue 5/15; 3-week follow-up. The areas on the right first toe. Not the left as I said on 4/24. He is using Hydrofera Blue. 5/29; 3-week follow-up. The area on his dorsal right toe over the right inter phalangeal joint is just about closed. We have been using Hydrofera Blue. In passing he showed me his right thumb which is exquisitely tender over the tip of the digit. 6/5; the area on the dorsal right toe over the inner phalangeal joint is closed. Right thumb is better which I gave him antibiotics for last week. Still some tenderness but considerably improved READMISSION 02/22/2022 This is a 66 year old type II diabetic with congestive heart failure, end-stage renal disease on hemodialysis, peripheral artery disease, and morbid obesity. He has developed an ulcer on his left posterior heel that he believes is secondary to his health assistance not adequately moisturizing his feet. He says it first appeared about 2 weeks ago. He does not have sensation in his feet. He has not noticed any foul odor or drainage. His last hemoglobin A1c was 7.9 at the beginning of January. ABI in clinic today was 1.15. 03/09/2022: He has a new wound on his abdomen that he says started out as a lump and then the skin broke open when he bumped it on a portion of his hospital bed at home. There is fat exposed. The wound on his heel is a little bit smaller  but still has a very pale surface. There is periwound callus accumulation. 03/30/2022: He had another small wound opened up on his abdomen. He says that he thinks these are starting as blisters. He has been on Mounjaro  for weight loss and it looks like the loose skin resulting from losing weight might be rubbing and causing small friction injuries. The wound that I treated with chemical cauterization last visit is much smaller; the new wound has a very similar appearance to this. His heel ulcer is smaller with just a little bit of slough buildup on the surface. He continues to have fairly impressive periwound callus formation. 04/07/2022: Both abdominal wounds are smaller today and there is less hypertrophic granulation tissue protruding. The wound on his heel also continues to contract. There is just a layer of slough on the surface. No concern for infection. 04/29/2022: The previous 2 abdominal wounds have closed but he has a new 1 that appears identical to the previous lesions. The wound on his heel is smaller but has a thick layer of callus overlying it. Electronic Signature(s) Signed: 04/29/2022 11:30:24 AM By: Fredirick Maudlin MD FACS Entered By: Fredirick Maudlin on 04/29/2022 11:30:24 -------------------------------------------------------------------------------- Chemical Cauterization Details Patient Name: Date of Service: [name], [name] 04/29/2022 11:00 A M Medical Record Number: [id number] Patient Account Number: [id number] Date of Birth/Sex: Treating RN: 1956/03/14 (66 y.o. [name] Primary Care Provider: [name] Other Clinician: Referring Provider: Treating Provider/Extender: [name] in Treatment: 9 Procedure Performed for: Wound #21 Right,Proximal Abdomen - midline Performed By: Physician Fredirick Maudlin, MD Post Procedure Diagnosis Same as Pre-procedure Electronic Signature(s) Signed: 04/29/2022 12:35:55 PM By: Fredirick Maudlin MD FACS Signed: 04/29/2022 4:17:40 PM By: [name] RN Entered By: [name] on 04/29/2022 11:25:24 -------------------------------------------------------------------------------- Physical Exam  Details Patient Name: Date of Service: [name], [name] 04/29/2022 11:00 [name] [place] Record Number: [id number] Patient Account Number: [id number] Date of Birth/Sex: Treating RN: Nov 18, 1955 (67 y.o. [name] Primary Care Provider: [name] Other Clinician: Referring Provider: Treating Provider/Extender: [name] in Treatment: 9 Constitutional . . . . No acute distress.Marland Kitchen Respiratory Normal work of breathing on room air.. Notes 04/29/2022: The previous 2 abdominal wounds have closed but he has a new 1 that appears identical to the previous lesions. The wound on his heel is smaller but has a thick layer of callus overlying it. Electronic Signature(s) Signed: 04/29/2022 11:30:55 AM By: Fredirick Maudlin MD FACS Entered By: Fredirick Maudlin on 04/29/2022 11:30:55 -------------------------------------------------------------------------------- Physician Orders Details Patient Name: Date of Service: [name], [name] 04/29/2022 11:00 A M Medical Record Number: [id number] Patient Account Number: [id number] Date of Birth/Sex: Treating RN: 08/29/1956 (66 y.o. [name] Primary Care Provider: [name] Other Clinician: Referring Provider: Treating Provider/Extender: [name] in Treatment: 9 Verbal / Phone Orders: No Diagnosis Coding Follow-up Appointments ppointment in 2 weeks. - Dr Celine Ahr Room 3 Friday August 25th at 10:15am Return A [employer] convey[employer] Other Bathing/Shower/Hygiene Orders/Instructions: - change wound dressing after bathing Off-Loading Wound #20 Left,Lateral Calcaneus Other: - When in bed place a couple of pillows behind the left calf to elevate heel off the bed [place] please change wound dressing 2 times a week. Wound Treatment Wound #20 - Calcaneus Wound Laterality: Left, Lateral Cleanser: Soap and Water 3 x Per Week/30  Days Discharge Instructions: May shower and wash wound with dial antibacterial soap and water prior to dressing change.  Cleanser: Wound Cleanser 3 x Per Week/30 Days Discharge Instructions: Cleanse the wound with wound cleanser prior to applying a clean dressing using gauze sponges, not tissue or cotton balls. Topical: Gentamicin 3 x Per Week/30 Days Discharge Instructions: As directed by physician Prim Dressing: Hydrofera Blue Classic Foam, 2x2 in 3 x Per Week/30 Days ary Discharge Instructions: Moisten with saline prior to applying to wound bed Secondary Dressing: Woven Gauze Sponge, Non-Sterile 4x4 in 3 x Per Week/30 Days Discharge Instructions: Apply over primary dressing as directed. Secured With: Elastic Bandage 4 inch (ACE bandage) 3 x Per Week/30 Days Discharge Instructions: Secure with ACE bandage as directed. Secured With: [employer], 4.5x3.1 (in/yd) 3 x Per Week/30 Days Discharge Instructions: Secure with Kerlix as directed. Secured With: Paper Tape, 2x10 (in/yd) 3 x Per Week/30 Days Discharge Instructions: Secure dressing with tape as directed. Wound #21 - Abdomen - midline Wound Laterality: Right, Proximal Cleanser: Soap and Water 3 x Per Week/30 Days Discharge Instructions: May shower and wash wound with dial antibacterial soap and water prior to dressing change. Cleanser: Wound Cleanser 3 x Per Week/30 Days Discharge Instructions: Cleanse the wound with wound cleanser prior to applying a clean dressing using gauze sponges, not tissue or cotton balls. Prim Dressing: KerraCel Ag Gelling Fiber Dressing, 2x2 in (silver alginate) 3 x Per Week/30 Days ary Discharge Instructions: Apply silver alginate to wound bed as instructed Secondary Dressing: Bordered Gauze, 2x2 in 3 x Per Week/30 Days Discharge Instructions: Apply over primary dressing as directed. Secondary Dressing: Woven Gauze Sponge, Non-Sterile 4x4 in 3 x Per Week/30 Days Discharge Instructions: Apply over  primary dressing as directed. Secured With: Paper Tape, 2x10 (in/yd) 3 x Per Week/30 Days Discharge Instructions: Secure dressing with tape as directed. Electronic Signature(s) Signed: 04/29/2022 12:35:55 PM By: Fredirick Maudlin MD FACS Signed: 04/29/2022 4:17:40 PM By: [name] RN Entered By: [name] on 04/29/2022 11:26:54 -------------------------------------------------------------------------------- Problem List Details Patient Name: Date of Service: [name], [name] 04/29/2022 11:00 [place] [facility] Record Number: [id number] Patient Account Number: [id number] Date of Birth/Sex: Treating RN: 1955/09/23 (66 y.o. [name] Primary Care Provider: [name] Other Clinician: Referring Provider: Treating Provider/Extender: [name] in Treatment: 9 Active Problems ICD-10 Encounter Code Description Active Date MDM Diagnosis [phone] Non-pressure chronic ulcer of left heel and midfoot with fat layer exposed 02/22/2022 No Yes E11.621 Type 2 diabetes mellitus with foot ulcer 02/22/2022 No Yes N18.6 End stage renal disease 02/22/2022 No Yes I50.9 Heart failure, unspecified 02/22/2022 No Yes I10 Essential (primary) hypertension 02/22/2022 No Yes I73.9 Peripheral vascular disease, unspecified 02/22/2022 No Yes L98.492 Non-pressure chronic ulcer of skin of other sites with fat layer exposed 03/09/2022 No Yes Inactive Problems Resolved Problems Electronic Signature(s) Signed: 04/29/2022 11:29:22 AM By: Fredirick Maudlin MD FACS Entered By: Fredirick Maudlin on 04/29/2022 11:29:22 -------------------------------------------------------------------------------- Progress Note Details Patient Name: Date of Service: [name], [name] 04/29/2022 11:00 A M Medical Record Number: [id number] Patient Account Number: [id number] Date of Birth/Sex: Treating RN: 02/10/1956 (66 y.o. [name] Primary Care Provider: [name] Other Clinician: Referring  Provider: Treating Provider/Extender: [name] in Treatment: 9 Subjective Chief Complaint Information obtained from Patient 11/09/2018; patient is here for review of wounds on his dorsal right first and second toes and on the dorsal left first toe. 02/22/2022: The patient is here for a new ulcer on his posterior left heel. History of Present Illness (HPI) READMISSION 11/09/2018 This is a now 66 year old man that we  had in this clinic over a multitude of years but most recently in 2015. He had wounds on his plantar foot initially in 2007 and 2008 and I think subsequently was seen in 2012 then 2014 into the mid part of 2015 with wounds on his toes. He is a type II diabetic with peripheral neuropathy but no known PAD. The patient states he was doing well until about a month ago he noted an area on the left great toe which was superficial and then an area on the right great and second toes about a week later. He is not sure how this happened he simply noticed this when he was lying in bed at night. He has been using peroxide. He has not seen a doctor. He has not been on antibiotics and has had no x-rays. Past medical history; type 2 diabetes with peripheral neuropathy, hypertension, hypothyroidism, low B12 levels, iron deficiency anemia, ventral hernia, diabetic nephropathy, chronic diastolic heart failure and obstructive sleep apnea. ABIs in this clinic were 1.2 on the right and 1.1 on the left 2/28; patient arrived last week for areas on his left first right first and right second toes. X-ray of the right foot did not show any osseous abnormality. Culture of the right great toe showed Staphylococcus aureus methicillin sensitive and he is on dicloxacillin which he started 3 days ago. 3/6; patient with wounds on his right first and second toes and left first toes. The area over the left first toe is extensive we have been using silver alginate. Culture grew MSSA and he is  on dicloxacillin which I prescribed on the 25th. He is still taking this which I am not really sure of the reason. He expressed knowledge that this was 4 times a day he should have been out of this around March 2 if he picked it up on the 25th or they gave him the right number of pills. Nevertheless our nurses report more purulent drainage and odor 3/17-Patient returns for the 3 wounds the left first toe and the right first and second toe dorsal surfaces. The left first toe is closed up and healed, the right first toe dorsal surface wound is extensive and appears the same as last time, no purulent drainage or odor reported per nurse, left second toe wound is slightly smaller. He has completed the dicloxacillin as of today. Noted that his culture previously had grown MSSA. We have been using calcium alginate to the wounds. X-rays have been reviewed. ABIs were reviewed as well. 3/27; patient returns for review of wounds on his right first and right second toes. The wound on the left first toe is healed. 4/10; 2-week follow-up. The area on the right second toe dorsally is healed. Per the patient the left first toe remains healed. He still has an extensive area dorsally over the right first toe inner phalangeal joint area. Rolled up hyper granulated tissue with probably a nonviable surface 4/24; 2-week follow-up. The areas on the right second toe dorsally and right first toe dorsally is healed. The area on the dorsal left first toe over the inner phalangeal joint is smaller but still hyper granulated we have been using Hydrofera Blue 5/15; 3-week follow-up. The areas on the right first toe. Not the left as I said on 4/24. He is using Hydrofera Blue. 5/29; 3-week follow-up. The area on his dorsal right toe over the right inter phalangeal joint is just about closed. We have been using Hydrofera Blue. In passing he showed me  his right thumb which is exquisitely tender over the tip of the digit. 6/5; the  area on the dorsal right toe over the inner phalangeal joint is closed. Right thumb is better which I gave him antibiotics for last week. Still some tenderness but considerably improved READMISSION 02/22/2022 This is a 66 year old type II diabetic with congestive heart failure, end-stage renal disease on hemodialysis, peripheral artery disease, and morbid obesity. He has developed an ulcer on his left posterior heel that he believes is secondary to his health assistance not adequately moisturizing his feet. He says it first appeared about 2 weeks ago. He does not have sensation in his feet. He has not noticed any foul odor or drainage. His last hemoglobin A1c was 7.9 at the beginning of January. ABI in clinic today was 1.15. 03/09/2022: He has a new wound on his abdomen that he says started out as a lump and then the skin broke open when he bumped it on a portion of his hospital bed at home. There is fat exposed. The wound on his heel is a little bit smaller but still has a very pale surface. There is periwound callus accumulation. 03/30/2022: He had another small wound opened up on his abdomen. He says that he thinks these are starting as blisters. He has been on Mounjaro for weight loss and it looks like the loose skin resulting from losing weight might be rubbing and causing small friction injuries. The wound that I treated with chemical cauterization last visit is much smaller; the new wound has a very similar appearance to this. His heel ulcer is smaller with just a little bit of slough buildup on the surface. He continues to have fairly impressive periwound callus formation. 04/07/2022: Both abdominal wounds are smaller today and there is less hypertrophic granulation tissue protruding. The wound on his heel also continues to contract. There is just a layer of slough on the surface. No concern for infection. 04/29/2022: The previous 2 abdominal wounds have closed but he has a new 1 that appears  identical to the previous lesions. The wound on his heel is smaller but has a thick layer of callus overlying it. Patient History Information obtained from Patient. Family History Cancer - Mother,Father, Diabetes - Mother,Father,Siblings, Heart Disease - Siblings, Lung Disease - Mother, No family history of Hereditary Spherocytosis, Hypertension, Kidney Disease, Seizures, Stroke, Thyroid Problems, Tuberculosis. Social History Never smoker, Marital Status - Single, Alcohol Use - Never, Drug Use - No History, Caffeine Use - Daily - Coffee. Medical History Eyes Denies history of Cataracts, Glaucoma, Optic Neuritis Ear/Nose/Mouth/Throat Denies history of Chronic sinus problems/congestion, Middle ear problems Hematologic/Lymphatic Denies history of Anemia, Hemophilia, Human Immunodeficiency Virus, Lymphedema, Sickle Cell Disease Respiratory Patient has history of Sleep Apnea Denies history of Aspiration, Asthma, Chronic Obstructive Pulmonary Disease (COPD), Pneumothorax, Tuberculosis Cardiovascular Patient has history of Congestive Heart Failure, Hypertension Denies history of Angina, Arrhythmia, Coronary Artery Disease, Deep Vein Thrombosis, Hypotension, Myocardial Infarction, Peripheral Arterial Disease, Peripheral Venous Disease, Phlebitis, Vasculitis Gastrointestinal Denies history of Cirrhosis , Colitis, Crohnoos, Hepatitis A, Hepatitis B, Hepatitis C Endocrine Patient has history of Type II Diabetes Denies history of Type I Diabetes Genitourinary Denies history of End Stage Renal Disease Immunological Denies history of Lupus Erythematosus, Raynaudoos, Scleroderma Integumentary (Skin) Denies history of History of Burn Musculoskeletal Patient has history of Osteoarthritis Denies history of Gout, Rheumatoid Arthritis, Osteomyelitis Neurologic Patient has history of Neuropathy - Diabetic Denies history of Dementia, Quadriplegia, Paraplegia, Seizure Disorder Oncologic Denies  history of  Received Chemotherapy, Received Radiation Psychiatric Denies history of Anorexia/bulimia, Confinement Anxiety Objective Constitutional No acute distress.. Vitals Time Taken: 11:07 AM, Height: 72 in, Weight: 392 lbs, BMI: 53.2, Temperature: 98.6 F, Pulse: 73 bpm, Respiratory Rate: 18 breaths/min, Blood Pressure: 123/77 mmHg. Respiratory Normal work of breathing on room air.. General Notes: 04/29/2022: The previous 2 abdominal wounds have closed but he has a new 1 that appears identical to the previous lesions. The wound on his heel is smaller but has a thick layer of callus overlying it. Integumentary (Hair, Skin) Wound #20 status is Open. Original cause of wound was Gradually Appeared. The date acquired was: 02/08/2022. The wound has been in treatment 9 weeks. The wound is located on the Left,Lateral Calcaneus. The wound measures 0.1cm length x 0.1cm width x 0.1cm depth; 0.008cm^2 area and 0.001cm^3 volume. There is Fat Layer (Subcutaneous Tissue) exposed. There is no tunneling or undermining noted. There is a medium amount of serosanguineous drainage noted. There is small (1-33%) pink granulation within the wound bed. There is a large (67-100%) amount of necrotic tissue within the wound bed including Eschar. Wound #21 status is Open. Original cause of wound was Blister. The date acquired was: 03/03/2022. The wound has been in treatment 7 weeks. The wound is located on the Right,Proximal Abdomen - midline. The wound measures 0.4cm length x 0.3cm width x 0.1cm depth; 0.094cm^2 area and 0.009cm^3 volume. There is no tunneling or undermining noted. There is a medium amount of serosanguineous drainage noted. There is small (1-33%) red granulation within the wound bed. There is a large (67-100%) amount of necrotic tissue within the wound bed including Adherent Slough. Wound #22 status is Healed - Epithelialized. Original cause of wound was Blister. The date acquired was: 03/23/2022. The wound  has been in treatment 4 weeks. The wound is located on the Distal Abdomen - midline. The wound measures 0cm length x 0cm width x 0cm depth; 0cm^2 area and 0cm^3 volume. There is no tunneling or undermining noted. There is a none present amount of drainage noted. There is no granulation within the wound bed. There is no necrotic tissue within the wound bed. Assessment Active Problems ICD-10 Non-pressure chronic ulcer of left heel and midfoot with fat layer exposed Type 2 diabetes mellitus with foot ulcer End stage renal disease Heart failure, unspecified Essential (primary) hypertension Peripheral vascular disease, unspecified Non-pressure chronic ulcer of skin of other sites with fat layer exposed Procedures Wound #20 Pre-procedure diagnosis of Wound #20 is a Diabetic Wound/Ulcer of the Lower Extremity located on the Left,Lateral Calcaneus .Severity of Tissue Pre Debridement is: Fat layer exposed. There was a Selective/Open Wound Non-Viable Tissue Debridement with a total area of 0.01 sq cm performed by Fredirick Maudlin, MD. With the following instrument(s): Curette to remove Non-Viable tissue/material. Material removed includes Callus. No specimens were taken. A time out was conducted at 11:20, prior to the start of the procedure. A Minimum amount of bleeding was controlled with Pressure. The procedure was tolerated well with a pain level of 0 throughout and a pain level of 0 following the procedure. Post Debridement Measurements: 0.1cm length x 0.1cm width x 0.1cm depth; 0.001cm^3 volume. Character of Wound/Ulcer Post Debridement is improved. Severity of Tissue Post Debridement is: Fat layer exposed. Post procedure Diagnosis Wound #20: Same as Pre-Procedure Wound #21 Pre-procedure diagnosis of Wound #21 is a Lesion located on the Right,Proximal Abdomen - midline . An Chemical Cauterization procedure was performed by Fredirick Maudlin, MD. Post procedure Diagnosis Wound #21: Same  as  Pre-Procedure Plan Follow-up Appointments: Return Appointment in 2 weeks. - Dr Celine Ahr Room 3 Friday August 25th at 10:15am Bathing/ Shower/ Hygiene: Other Bathing/Shower/Hygiene Orders/Instructions: - change wound dressing after bathing Off-Loading: Wound #20 Left,Lateral Calcaneus: Other: - When in bed place a couple of pillows behind the left calf to elevate heel off the bed Home Health: Other Home Health Orders/Instructions: - Home Health please change wound dressing 2 times a week. WOUND #20: - Calcaneus Wound Laterality: Left, Lateral Cleanser: Soap and Water 3 x Per Week/30 Days Discharge Instructions: May shower and wash wound with dial antibacterial soap and water prior to dressing change. Cleanser: Wound Cleanser 3 x Per Week/30 Days Discharge Instructions: Cleanse the wound with wound cleanser prior to applying a clean dressing using gauze sponges, not tissue or cotton balls. Topical: Gentamicin 3 x Per Week/30 Days Discharge Instructions: As directed by physician Prim Dressing: Hydrofera Blue Classic Foam, 2x2 in 3 x Per Week/30 Days ary Discharge Instructions: Moisten with saline prior to applying to wound bed Secondary Dressing: Woven Gauze Sponge, Non-Sterile 4x4 in 3 x Per Week/30 Days Discharge Instructions: Apply over primary dressing as directed. Secured With: Elastic Bandage 4 inch (ACE bandage) 3 x Per Week/30 Days Discharge Instructions: Secure with ACE bandage as directed. Secured With: [employer], 4.5x3.1 (in/yd) 3 x Per Week/30 Days Discharge Instructions: Secure with Kerlix as directed. Secured With: Paper T ape, 2x10 (in/yd) 3 x Per Week/30 Days Discharge Instructions: Secure dressing with tape as directed. WOUND #21: - Abdomen - midline Wound Laterality: Right, Proximal Cleanser: Soap and Water 3 x Per Week/30 Days Discharge Instructions: May shower and wash wound with dial antibacterial soap and water prior to dressing change. Cleanser: Wound  Cleanser 3 x Per Week/30 Days Discharge Instructions: Cleanse the wound with wound cleanser prior to applying a clean dressing using gauze sponges, not tissue or cotton balls. Prim Dressing: KerraCel Ag Gelling Fiber Dressing, 2x2 in (silver alginate) 3 x Per Week/30 Days ary Discharge Instructions: Apply silver alginate to wound bed as instructed Secondary Dressing: Bordered Gauze, 2x2 in 3 x Per Week/30 Days Discharge Instructions: Apply over primary dressing as directed. Secondary Dressing: Woven Gauze Sponge, Non-Sterile 4x4 in 3 x Per Week/30 Days Discharge Instructions: Apply over primary dressing as directed. Secured With: Paper T ape, 2x10 (in/yd) 3 x Per Week/30 Days Discharge Instructions: Secure dressing with tape as directed. 04/29/2022: The previous 2 abdominal wounds have closed but he has a new 1 that appears identical to the previous lesions. The wound on his heel is smaller but has a thick layer of callus overlying it. I used silver nitrate to chemically cauterize the hypertrophic granulation tissue protruding from the small abdominal wound. I used a curette to debride the callus from his heel. Continue silver alginate to the abdominal wound and Hydrofera Blue to the heel wound. Follow-up in 2 weeks. Electronic Signature(s) Signed: 04/29/2022 12:24:46 PM By: Fredirick Maudlin MD FACS Entered By: Fredirick Maudlin on 04/29/2022 12:24:46 -------------------------------------------------------------------------------- HxROS Details Patient Name: Date of Service: [name], [name] 04/29/2022 11:00 A M Medical Record Number: [id number] Patient Account Number: [id number] Date of Birth/Sex: Treating RN: 1956-06-12 (66 y.o. [name] Primary Care Provider: [name] Other Clinician: Referring Provider: Treating Provider/Extender: [name] in Treatment: 9 Information Obtained From Patient Eyes Medical History: Negative for: Cataracts;  Glaucoma; Optic Neuritis Ear/Nose/Mouth/Throat Medical History: Negative for: Chronic sinus problems/congestion; Middle ear problems Hematologic/Lymphatic Medical History: Negative for: Anemia; Hemophilia; Human Immunodeficiency  Virus; Lymphedema; Sickle Cell Disease Respiratory Medical History: Positive for: Sleep Apnea Negative for: Aspiration; Asthma; Chronic Obstructive Pulmonary Disease (COPD); Pneumothorax; Tuberculosis Cardiovascular Medical History: Positive for: Congestive Heart Failure; Hypertension Negative for: Angina; Arrhythmia; Coronary Artery Disease; Deep Vein Thrombosis; Hypotension; Myocardial Infarction; Peripheral Arterial Disease; Peripheral Venous Disease; Phlebitis; Vasculitis Gastrointestinal Medical History: Negative for: Cirrhosis ; Colitis; Crohns; Hepatitis A; Hepatitis B; Hepatitis C Endocrine Medical History: Positive for: Type II Diabetes Negative for: Type I Diabetes Time with diabetes: 1994 Treated with: Insulin Blood sugar tested every day: No Genitourinary Medical History: Negative for: End Stage Renal Disease Immunological Medical History: Negative for: Lupus Erythematosus; Raynauds; Scleroderma Integumentary (Skin) Medical History: Negative for: History of Burn Musculoskeletal Medical History: Positive for: Osteoarthritis Negative for: Gout; Rheumatoid Arthritis; Osteomyelitis Neurologic Medical History: Positive for: Neuropathy - Diabetic Negative for: Dementia; Quadriplegia; Paraplegia; Seizure Disorder Oncologic Medical History: Negative for: Received Chemotherapy; Received Radiation Psychiatric Medical History: Negative for: Anorexia/bulimia; Confinement Anxiety Immunizations Pneumococcal Vaccine: Received Pneumococcal Vaccination: No Implantable Devices No devices added Family and Social History Cancer: Yes - Mother,Father; Diabetes: Yes - Mother,Father,Siblings; Heart Disease: Yes - Siblings; Hereditary Spherocytosis:  No; Hypertension: No; Kidney Disease: No; Lung Disease: Yes - Mother; Seizures: No; Stroke: No; Thyroid Problems: No; Tuberculosis: No; Never smoker; Marital Status - Single; Alcohol Use: Never; Drug Use: No History; Caffeine Use: Daily - Coffee; Financial Concerns: No; Food, Clothing or Shelter Needs: No; Support System Lacking: No; Transportation Concerns: No Electronic Signature(s) Signed: 04/29/2022 12:35:55 PM By: Fredirick Maudlin MD FACS Signed: 04/29/2022 4:17:40 PM By: [name] RN Entered By: Fredirick Maudlin on 04/29/2022 11:30:32 -------------------------------------------------------------------------------- SuperBill Details Patient Name: Date of Service: [name], [name] 04/29/2022 Medical Record Number: [id number] Patient Account Number: [id number] Date of Birth/Sex: Treating RN: 1956/08/06 (65 y.o. [name] Primary Care Provider: [name] Other Clinician: Referring Provider: Treating Provider/Extender: [name] in Treatment: 9 Diagnosis Coding ICD-10 Codes Code Description [phone] Non-pressure chronic ulcer of left heel and midfoot with fat layer exposed E11.621 Type 2 diabetes mellitus with foot ulcer N18.6 End stage renal disease I50.9 Heart failure, unspecified I10 Essential (primary) hypertension I73.9 Peripheral vascular disease, unspecified L98.492 Non-pressure chronic ulcer of skin of other sites with fat layer exposed Facility Procedures CPT4 Code: 35573220 I Description: [phone] - DEBRIDE WOUND 1ST 20 SQ CM OR < CD-10 Diagnosis Description L97.422 Non-pressure chronic ulcer of left heel and midfoot with fat layer exposed Modifier: Quantity: 1 CPT4 Code: [id number] 17 I Description: 55 - CHEM CAUT GRANULATION TISS CD-10 Diagnosis Description L98.492 Non-pressure chronic ulcer of skin of other sites with fat layer exposed Modifier: 1 Quantity: Physician Procedures : CPT4 Code Description Modifier [id number]  - WC PHYS LEVEL 3 - EST PT 25 ICD-10 Diagnosis Description L97.422 Non-pressure chronic ulcer of left heel and midfoot with fat layer exposed L98.492 Non-pressure chronic ulcer of skin of other sites with  fat layer exposed E11.621 Type 2 diabetes mellitus with foot ulcer I73.9 Peripheral vascular disease, unspecified Quantity: 1 : [id number] - WC PHYS DEBR WO ANESTH 20 SQ CM ICD-10 Diagnosis Description L97.422 Non-pressure chronic ulcer of left heel and midfoot with fat layer exposed Quantity: 1 : [id number] - WC PHYS CHEM CAUT GRAN TISSUE ICD-10 Diagnosis Description L98.492 Non-pressure chronic ulcer of skin of other sites with fat layer exposed Quantity: 1 Electronic Signature(s) Signed: 04/29/2022 12:25:20 PM By: Fredirick Maudlin MD FACS Entered By: Fredirick Maudlin on 04/29/2022 12:25:19

## 2022-04-29 NOTE — Progress Notes (Signed)
SAYAN, ALDAVA ([id number]) Visit Report for 04/29/2022 Arrival Information Details Patient Name: Date of Service: [name], [name] 04/29/2022 11:00 A M Medical Record Number: [id number] Patient Account Number: [id number] Date of Birth/Sex: Treating RN: 10-Jul-1956 (66 y.o. [name] Primary Care Chrishun Scheer: [name] Other Clinician: Referring Divine Hansley: Treating Hezikiah Retzloff/Extender: [name] in Treatment: 9 Visit Information History Since Last Visit Added or deleted any medications: No Patient Arrived: Wheel Chair Any new allergies or adverse reactions: No Arrival Time: 11:06 Had a fall or experienced change in No Accompanied By: self activities of daily living that may affect Transfer Assistance: None risk of falls: Patient Identification Verified: Yes Signs or symptoms of abuse/neglect since last visito No Patient Requires Transmission-Based Precautions: No Hospitalized since last visit: No Patient Has Alerts: No Implantable device outside of the clinic excluding No cellular tissue based products placed in the center since last visit: Has Dressing in Place as Prescribed: Yes Pain Present Now: Yes Notes Pt. stayed in his W/C Electronic Signature(s) Signed: 04/29/2022 4:17:40 PM By: [name] RN Entered By: [name] on 04/29/2022 11:07:24 -------------------------------------------------------------------------------- Lower Extremity Assessment Details Patient Name: Date of Service: [name], [name] 04/29/2022 11:00 A M Medical Record Number: [id number] Patient Account Number: [id number] Date of Birth/Sex: Treating RN: [birth year] (66 y.o. [name] Primary Care Essam Lowdermilk: [name] Other Clinician: Referring Azalee Weimer: Treating Henrique Parekh/Extender: [name] in Treatment: 9 Edema Assessment Assessed: [Left: No] [Right: No] Edema: [Left: Ye] [Right: s] Calf Left: Right: Point of  Measurement: 37 cm From Medial Instep 47.2 cm Ankle Left: Right: Point of Measurement: 14 cm From Medial Instep 29.3 cm Electronic Signature(s) Signed: 04/29/2022 4:17:40 PM By: [name] RN Entered By: [name] on 04/29/2022 11:09:29 -------------------------------------------------------------------------------- Multi Wound Chart Details Patient Name: Date of Service: [name], [name] 04/29/2022 11:00 A M Medical Record Number: [id number] Patient Account Number: [id number] Date of Birth/Sex: Treating RN: [birth year] (66 y.o. [name] Primary Care Bayler Nehring: [name] Other Clinician: Referring Janathan Bribiesca: Treating Mayan Dolney/Extender: [name] in Treatment: 9 Vital Signs Height(in): 72 Pulse(bpm): 63 Weight(lbs): 392 Blood Pressure(mmHg): 123/77 Body Mass Index(BMI): 53.2 Temperature(F): 98.6 Respiratory Rate(breaths/min): 18 Photos: [22:No Photos] Left, Lateral Calcaneus Right, Proximal Abdomen - midline Distal Abdomen - midline Wound Location: Gradually Appeared Blister Blister Wounding Event: Diabetic Wound/Ulcer of the Lower Lesion Lesion Primary Etiology: Extremity Sleep Apnea, Congestive Heart Sleep Apnea, Congestive Heart Sleep Apnea, Congestive Heart Comorbid History: Failure, Hypertension, Type II Failure, Hypertension, Type II Failure, Hypertension, Type II Diabetes, Osteoarthritis, NeuropathyDiabetes, Osteoarthritis, Neuropathy Diabetes, Osteoarthritis, Neuropathy 02/08/2022 03/03/2022 03/23/2022 Date Acquired: '9 7 4 '$ Weeks of Treatment: Open Open Healed - Epithelialized Wound Status: No No No Wound Recurrence: 0.1x0.1x0.1 0.4x0.3x0.1 0x0x0 Measurements L x W x D (cm) 0.008 0.094 0 A (cm) : rea 0.001 0.009 0 Volume (cm) : 99.30% 33.30% N/A % Reduction in A rea: 99.80% 35.70% N/A % Reduction in Volume: Grade 2 Full Thickness Without Exposed Partial Thickness Classification: Support  Structures Medium Medium None Present Exudate A mount: Serosanguineous Serosanguineous N/A Exudate Type: red, brown red, brown N/A Exudate Color: Small (1-33%) Small (1-33%) None Present (0%) Granulation A mount: Pink Red N/A Granulation Quality: Large (67-100%) Large (67-100%) None Present (0%) Necrotic A mount: Eschar Adherent Slough N/A Necrotic Tissue: Fat Layer (Subcutaneous Tissue): Yes Fascia: No Fascia: No Exposed Structures: Fascia: No Fat Layer (Subcutaneous Tissue): No Fat Layer (Subcutaneous Tissue): No Tendon: No Tendon: No Tendon: No Muscle: No Muscle: No Muscle:  No Joint: No Joint: No Joint: No Bone: No Bone: No Bone: No Medium (34-66%) Large (67-100%) Large (67-100%) Epithelialization: Debridement - Selective/Open Wound N/A N/A Debridement: Pre-procedure Verification/Time Out 11:20 N/A N/A Taken: Callus N/A N/A Tissue Debrided: Non-Viable Tissue N/A N/A Level: 0.01 N/A N/A Debridement A (sq cm): rea Curette N/A N/A Instrument: Minimum N/A N/A Bleeding: Pressure N/A N/A Hemostasis A chieved: 0 N/A N/A Procedural Pain: 0 N/A N/A Post Procedural Pain: Procedure was tolerated well N/A N/A Debridement Treatment Response: 0.1x0.1x0.1 N/A N/A Post Debridement Measurements L x W x D (cm) 0.001 N/A N/A Post Debridement Volume: (cm) Debridement Chemical Cauterization N/A Procedures Performed: Treatment Notes Electronic Signature(s) Signed: 04/29/2022 11:29:33 AM By: Fredirick Maudlin MD FACS Signed: 04/29/2022 4:17:40 PM By: [name] RN Entered By: Fredirick Maudlin on 04/29/2022 11:29:33 -------------------------------------------------------------------------------- Multi-Disciplinary Care Plan Details Patient Name: Date of Service: [name], [name] 04/29/2022 11:00 A M Medical Record Number: [id number] Patient Account Number: [id number] Date of Birth/Sex: Treating RN: 1956-09-17 (67 y.o. [name] Primary Care Gaelan Glennon:  [name] Other Clinician: Referring Tatyana Biber: Treating Jimmy Stipes/Extender: [name] in Treatment: 9 Active Inactive Wound/Skin Impairment Nursing Diagnoses: Impaired tissue integrity Goals: Patient/caregiver will verbalize understanding of skin care regimen Date Initiated: 02/22/2022 Target Resolution Date: 07/19/2022 Goal Status: Active Interventions: Assess ulceration(s) every visit Treatment Activities: Skin care regimen initiated : 02/22/2022 Notes: Electronic Signature(s) Signed: 04/29/2022 4:17:40 PM By: [name] RN Entered By: [name] on 04/29/2022 11:19:57 -------------------------------------------------------------------------------- Pain Assessment Details Patient Name: Date of Service: [name], [name] 04/29/2022 11:00 [place] Record Number: [id number] Patient Account Number: [id number] Date of Birth/Sex: Treating RN: 10/15/1955 (66 y.o. [name] Primary Care Mirriam Vadala: [name] Other Clinician: Referring Margherita Collyer: Treating Rosemae Mcquown/Extender: [name] in Treatment: 9 Active Problems Location of Pain Severity and Description of Pain Patient Has Paino Yes Site Locations Pain Location: Generalized Pain With Dressing Change: No Duration of the Pain. Constant / Intermittento Constant Rate the pain. Current Pain Level: 9 Worst Pain Level: 10 Least Pain Level: 7 Tolerable Pain Level: 7 Pain Management and Medication Current Pain Management: Medication: Yes Cold Application: No Rest: Yes Massage: No Activity: No T.E.N.S.: No Heat Application: No Leg drop or elevation: No Is the Current Pain Management Adequate: Adequate How does your wound impact your activities of daily livingo Sleep: No Bathing: No Appetite: No Relationship With Others: No Bladder Continence: No Emotions: No Bowel Continence: No Work: No Toileting: No Drive: No Dressing: No Hobbies:  No Electronic Signature(s) Signed: 04/29/2022 4:17:40 PM By: [name] RN Entered By: [name] on 04/29/2022 11:09:13 -------------------------------------------------------------------------------- Patient/Caregiver Education Details Patient Name: Date of Service: [name], [name] 8/11/2023andnbsp11:00 A M Medical Record Number: [id number] Patient Account Number: [id number] Date of Birth/Gender: Treating RN: 1956/04/06 (66 y.o. [name] Primary Care Physician: [name] Other Clinician: Referring Physician: Treating Physician/Extender: [name] in Treatment: 9 Education Assessment Education Provided To: Patient Education Topics Provided Wound/Skin Impairment: Methods: Explain/Verbal Responses: Return demonstration correctly Electronic Signature(s) Signed: 04/29/2022 4:17:40 PM By: [name] RN Signed: 04/29/2022 4:17:40 PM By: [name] RN Entered By: [name] on 04/29/2022 11:23:34 -------------------------------------------------------------------------------- Wound Assessment Details Patient Name: Date of Service: [name], [place] 04/29/2022 11:00 A M Medical Record Number: [id number] Patient Account Number: [id number] Date of Birth/Sex: Treating RN: 1956-08-12 (66 y.o. [name] Primary Care Inocencio Roy: [name] Other Clinician: Referring Arlanda Shiplett: Treating Wiatt Mahabir/Extender: [name] in Treatment: 9 Wound  Status Wound Number: 20 Primary Diabetic Wound/Ulcer of the Lower Extremity Etiology: Wound Location: Left, Lateral Calcaneus Wound Open Wounding Event: Gradually Appeared Status: Date Acquired: 02/08/2022 Comorbid Sleep Apnea, Congestive Heart Failure, Hypertension, Type II Weeks Of Treatment: 9 History: Diabetes, Osteoarthritis, Neuropathy Clustered Wound: No Photos Wound Measurements Length: (cm) 0.1 Width: (cm) 0.1 Depth: (cm) 0.1 Area:  (cm) 0.008 Volume: (cm) 0.001 % Reduction in Area: 99.3% % Reduction in Volume: 99.8% Epithelialization: Medium (34-66%) Tunneling: No Undermining: No Wound Description Classification: Grade 2 Exudate Amount: Medium Exudate Type: Serosanguineous Exudate Color: red, brown Foul Odor After Cleansing: No Slough/Fibrino Yes Wound Bed Granulation Amount: Small (1-33%) Exposed Structure Granulation Quality: Pink Fascia Exposed: No Necrotic Amount: Large (67-100%) Fat Layer (Subcutaneous Tissue) Exposed: Yes Necrotic Quality: Eschar Tendon Exposed: No Muscle Exposed: No Joint Exposed: No Bone Exposed: No Treatment Notes Wound #20 (Calcaneus) Wound Laterality: Left, Lateral Cleanser Soap and Water Discharge Instruction: May shower and wash wound with dial antibacterial soap and water prior to dressing change. Wound Cleanser Discharge Instruction: Cleanse the wound with wound cleanser prior to applying a clean dressing using gauze sponges, not tissue or cotton balls. Peri-Wound Care Topical Gentamicin Discharge Instruction: As directed by physician Primary Dressing Hydrofera Blue Classic Foam, 2x2 in Discharge Instruction: Moisten with saline prior to applying to wound bed Secondary Dressing Woven Gauze Sponge, Non-Sterile 4x4 in Discharge Instruction: Apply over primary dressing as directed. Secured With Elastic Bandage 4 inch (ACE bandage) Discharge Instruction: Secure with ACE bandage as directed. Kerlix Roll Sterile, 4.5x3.1 (in/yd) Discharge Instruction: Secure with Kerlix as directed. Paper Tape, 2x10 (in/yd) Discharge Instruction: Secure dressing with tape as directed. Compression Wrap Compression Stockings Add-Ons Electronic Signature(s) Signed: 04/29/2022 4:17:40 PM By: [name] RN Entered By: [name] on 04/29/2022 11:11:31 -------------------------------------------------------------------------------- Wound Assessment Details Patient  Name: Date of Service: [name], [name] 04/29/2022 11:00 A M Medical Record Number: [id number] Patient Account Number: [id number] Date of Birth/Sex: Treating RN: December 25, 1955 (66 y.o. [name] Primary Care Francois Elk: [name] Other Clinician: Referring Dray Dente: Treating Chaden Doom/Extender: [name] in Treatment: 9 Wound Status Wound Number: 21 Primary Lesion Etiology: Wound Location: Right, Proximal Abdomen - midline Wound Open Wounding Event: Blister Status: Date Acquired: 03/03/2022 Comorbid Sleep Apnea, Congestive Heart Failure, Hypertension, Type II Weeks Of Treatment: 7 History: Diabetes, Osteoarthritis, Neuropathy Clustered Wound: No Photos Wound Measurements Length: (cm) 0.4 Width: (cm) 0.3 Depth: (cm) 0.1 Area: (cm) 0.094 Volume: (cm) 0.009 % Reduction in Area: 33.3% % Reduction in Volume: 35.7% Epithelialization: Large (67-100%) Tunneling: No Undermining: No Wound Description Classification: Full Thickness Without Exposed Support Structures Exudate Amount: Medium Exudate Type: Serosanguineous Exudate Color: red, brown Foul Odor After Cleansing: No Slough/Fibrino No Wound Bed Granulation Amount: Small (1-33%) Exposed Structure Granulation Quality: Red Fascia Exposed: No Necrotic Amount: Large (67-100%) Fat Layer (Subcutaneous Tissue) Exposed: No Necrotic Quality: Adherent Slough Tendon Exposed: No Muscle Exposed: No Joint Exposed: No Bone Exposed: No Treatment Notes Wound #21 (Abdomen - midline) Wound Laterality: Right, Proximal Cleanser Soap and Water Discharge Instruction: May shower and wash wound with dial antibacterial soap and water prior to dressing change. Wound Cleanser Discharge Instruction: Cleanse the wound with wound cleanser prior to applying a clean dressing using gauze sponges, not tissue or cotton balls. Peri-Wound Care Topical Primary Dressing KerraCel Ag Gelling Fiber Dressing, 2x2 in  (silver alginate) Discharge Instruction: Apply silver alginate to wound bed as instructed Secondary Dressing Bordered Gauze, 2x2 in Discharge Instruction: Apply over primary dressing as directed.  Woven Gauze Sponge, Non-Sterile 4x4 in Discharge Instruction: Apply over primary dressing as directed. Secured With Paper Tape, 2x10 (in/yd) Discharge Instruction: Secure dressing with tape as directed. Compression Wrap Compression Stockings Add-Ons Electronic Signature(s) Signed: 04/29/2022 4:17:40 PM By: [name] RN Entered By: [name] on 04/29/2022 11:12:20 -------------------------------------------------------------------------------- Wound Assessment Details Patient Name: Date of Service: [name], [name] 04/29/2022 11:00 A M Medical Record Number: [id number] Patient Account Number: [id number] Date of Birth/Sex: Treating RN: 21-Nov-1955 (66 y.o. [name] Primary Care Aldair Rickel: [name] Other Clinician: Referring Halina Asano: Treating Murle Hellstrom/Extender: [name] in Treatment: 9 Wound Status Wound Number: 22 Primary Lesion Etiology: Wound Location: Distal Abdomen - midline Wound Healed - Epithelialized Wounding Event: Blister Status: Date Acquired: 03/23/2022 Comorbid Sleep Apnea, Congestive Heart Failure, Hypertension, Type II Weeks Of Treatment: 4 History: Diabetes, Osteoarthritis, Neuropathy Clustered Wound: No Wound Measurements Length: (cm) Width: (cm) Depth: (cm) Area: (cm) Volume: (cm) 0 % Reduction in Area: 0 % Reduction in Volume: 0 Epithelialization: Large (67-100%) 0 Tunneling: No 0 Undermining: No Wound Description Classification: Partial Thickness Exudate Amount: None Present Foul Odor After Cleansing: No Slough/Fibrino No Wound Bed Granulation Amount: None Present (0%) Exposed Structure Necrotic Amount: None Present (0%) Fascia Exposed: No Fat Layer (Subcutaneous Tissue) Exposed: No Tendon  Exposed: No Muscle Exposed: No Joint Exposed: No Bone Exposed: No Electronic Signature(s) Signed: 04/29/2022 4:17:40 PM By: [name] RN Entered By: [name] on 04/29/2022 11:10:34 -------------------------------------------------------------------------------- Vitals Details Patient Name: Date of Service: [name], [name] 04/29/2022 11:00 A M Medical Record Number: [id number] Patient Account Number: [id number] Date of Birth/Sex: Treating RN: Jun 22, 1956 (66 y.o. [name] Primary Care Khadeeja Elden: [name] Other Clinician: Referring Sharmin Foulk: Treating Dante Cooter/Extender: [name] in Treatment: 9 Vital Signs Time Taken: 11:07 Temperature (F): 98.6 Height (in): 72 Pulse (bpm): 73 Weight (lbs): 392 Respiratory Rate (breaths/min): 18 Body Mass Index (BMI): 53.2 Blood Pressure (mmHg): 123/77 Reference Range: 80 - 120 mg / dl Electronic Signature(s) Signed: 04/29/2022 4:17:40 PM By: [name] RN Entered By: [name] on 04/29/2022 11:08:02

## 2022-05-13 ENCOUNTER — Encounter (HOSPITAL_BASED_OUTPATIENT_CLINIC_OR_DEPARTMENT_OTHER): Payer: Medicare Other | Admitting: General Surgery

## 2022-05-13 DIAGNOSIS — E11621 Type 2 diabetes mellitus with foot ulcer: Secondary | ICD-10-CM | POA: Diagnosis not present

## 2022-05-13 NOTE — Progress Notes (Signed)
HERB, BELTRE ([id number]) Visit Report for 05/13/2022 Chief Complaint Document Details Patient Name: Date of Service: [name], [name] 05/13/2022 10:15 A M Medical Record Number: [id number] Patient Account Number: [id number] Date of Birth/Sex: Treating RN: 1956-07-07 (66 y.o. M) Primary Care Provider: [name] Other Clinician: Referring Provider: Treating Provider/Extender: [name] in Treatment: 11 Information Obtained from: Patient Chief Complaint 11/09/2018; patient is here for review of wounds on his dorsal right first and second toes and on the dorsal left first toe. 02/22/2022: The patient is here for a new ulcer on his posterior left heel. Electronic Signature(s) Signed: 05/13/2022 11:28:18 AM By: Fredirick Maudlin MD FACS Entered By: Fredirick Maudlin on 05/13/2022 11:28:18 -------------------------------------------------------------------------------- HPI Details Patient Name: Date of Service: [name], [name] 05/13/2022 10:15 A M Medical Record Number: [id number] Patient Account Number: [id number] Date of Birth/Sex: Treating RN: [birth year] (65 y.o. M) Primary Care Provider: [name] Other Clinician: Referring Provider: Treating Provider/Extender: [name] in Treatment: 11 History of Present Illness HPI Description: READMISSION 11/09/2018 This is a now 66 year old man that we had in this clinic over a multitude of years but most recently in 2015. He had wounds on his plantar foot initially in 2007 and 2008 and I think subsequently was seen in 2012 then 2014 into the mid part of 2015 with wounds on his toes. He is a type II diabetic with peripheral neuropathy but no known PAD. The patient states he was doing well until about a month ago he noted an area on the left great toe which was superficial and then an area on the right great and second toes about a week later. He is not sure how this happened he simply  noticed this when he was lying in bed at night. He has been using peroxide. He has not seen a doctor. He has not been on antibiotics and has had no x-rays. Past medical history; type 2 diabetes with peripheral neuropathy, hypertension, hypothyroidism, low B12 levels, iron deficiency anemia, ventral hernia, diabetic nephropathy, chronic diastolic heart failure and obstructive sleep apnea. ABIs in this clinic were 1.2 on the right and 1.1 on the left 2/28; patient arrived last week for areas on his left first right first and right second toes. X-ray of the right foot did not show any osseous abnormality. Culture of the right great toe showed Staphylococcus aureus methicillin sensitive and he is on dicloxacillin which he started 3 days ago. 3/6; patient with wounds on his right first and second toes and left first toes. The area over the left first toe is extensive we have been using silver alginate. Culture grew MSSA and he is on dicloxacillin which I prescribed on the 25th. He is still taking this which I am not really sure of the reason. He expressed knowledge that this was 4 times a day he should have been out of this around March 2 if he picked it up on the 25th or they gave him the right number of pills. Nevertheless our nurses report more purulent drainage and odor 3/17-Patient returns for the 3 wounds the left first toe and the right first and second toe dorsal surfaces. The left first toe is closed up and healed, the right first toe dorsal surface wound is extensive and appears the same as last time, no purulent drainage or odor reported per nurse, left second toe wound is slightly smaller. He has completed the dicloxacillin as of today. Noted that his culture previously had grown MSSA.  We have been using calcium alginate to the wounds. X-rays have been reviewed. ABIs were reviewed as well. 3/27; patient returns for review of wounds on his right first and right second toes. The wound on the  left first toe is healed. 4/10; 2-week follow-up. The area on the right second toe dorsally is healed. Per the patient the left first toe remains healed. He still has an extensive area dorsally over the right first toe inner phalangeal joint area. Rolled up hyper granulated tissue with probably a nonviable surface 4/24; 2-week follow-up. The areas on the right second toe dorsally and right first toe dorsally is healed. The area on the dorsal left first toe over the inner phalangeal joint is smaller but still hyper granulated we have been using Hydrofera Blue 5/15; 3-week follow-up. The areas on the right first toe. Not the left as I said on 4/24. He is using Hydrofera Blue. 5/29; 3-week follow-up. The area on his dorsal right toe over the right inter phalangeal joint is just about closed. We have been using Hydrofera Blue. In passing he showed me his right thumb which is exquisitely tender over the tip of the digit. 6/5; the area on the dorsal right toe over the inner phalangeal joint is closed. Right thumb is better which I gave him antibiotics for last week. Still some tenderness but considerably improved READMISSION 02/22/2022 This is a 66 year old type II diabetic with congestive heart failure, end-stage renal disease on hemodialysis, peripheral artery disease, and morbid obesity. He has developed an ulcer on his left posterior heel that he believes is secondary to his health assistance not adequately moisturizing his feet. He says it first appeared about 2 weeks ago. He does not have sensation in his feet. He has not noticed any foul odor or drainage. His last hemoglobin A1c was 7.9 at the beginning of January. ABI in clinic today was 1.15. 03/09/2022: He has a new wound on his abdomen that he says started out as a lump and then the skin broke open when he bumped it on a portion of his hospital bed at home. There is fat exposed. The wound on his heel is a little bit smaller but still has a very  pale surface. There is periwound callus accumulation. 03/30/2022: He had another small wound opened up on his abdomen. He says that he thinks these are starting as blisters. He has been on Mounjaro for weight loss and it looks like the loose skin resulting from losing weight might be rubbing and causing small friction injuries. The wound that I treated with chemical cauterization last visit is much smaller; the new wound has a very similar appearance to this. His heel ulcer is smaller with just a little bit of slough buildup on the surface. He continues to have fairly impressive periwound callus formation. 04/07/2022: Both abdominal wounds are smaller today and there is less hypertrophic granulation tissue protruding. The wound on his heel also continues to contract. There is just a layer of slough on the surface. No concern for infection. 04/29/2022: The previous 2 abdominal wounds have closed but he has a new 1 that appears identical to the previous lesions. The wound on his heel is smaller but has a thick layer of callus overlying it. 05/13/2022: His heel wound has healed. The wound on his abdomen remains small but still has some hypertrophic granulation tissue present. He says it drains serous fluid from time to time. Electronic Signature(s) Signed: 05/13/2022 11:28:56 AM By: Fredirick Maudlin MD  FACS Entered By: Fredirick Maudlin on 05/13/2022 11:28:56 -------------------------------------------------------------------------------- Chemical Cauterization Details Patient Name: Date of Service: MAIJOR, HORNIG 05/13/2022 10:15 A M Medical Record Number: [id number] Patient Account Number: [id number] Date of Birth/Sex: Treating RN: 10/21/1955 (66 y.o. [name] Primary Care Provider: [name] Other Clinician: Referring Provider: Treating Provider/Extender: [name] in Treatment: 11 Procedure Performed for: Wound #21 Right,Proximal Abdomen -  midline Performed By: Physician Fredirick Maudlin, MD Post Procedure Diagnosis Same as Pre-procedure Electronic Signature(s) Signed: 05/13/2022 1:33:14 PM By: Fredirick Maudlin MD FACS Signed: 05/13/2022 6:24:42 PM By: [name] RN Entered By: [name] on 05/13/2022 11:17:27 -------------------------------------------------------------------------------- Physical Exam Details Patient Name: Date of Service: [name], [name] 05/13/2022 10:15 A M Medical Record Number: [id number] Patient Account Number: [id number] Date of Birth/Sex: Treating RN: 04-09-1956 (66 y.o. M) Primary Care Provider: Other Clinician: [name] Referring Provider: Treating Provider/Extender: [name] in Treatment: 11 Constitutional . . . . No acute distress.Marland Kitchen Respiratory Normal work of breathing on room air.. Notes 05/13/2022: His heel wound has healed. The wound on his abdomen remains small but still has some hypertrophic granulation tissue present. He says it drains serous fluid from time to time. Electronic Signature(s) Signed: 05/13/2022 11:29:25 AM By: Fredirick Maudlin MD FACS Entered By: Fredirick Maudlin on 05/13/2022 11:29:25 -------------------------------------------------------------------------------- Physician Orders Details Patient Name: Date of Service: [name], [name] 05/13/2022 10:15 A M Medical Record Number: [id number] Patient Account Number: [id number] Date of Birth/Sex: Treating RN: 06-Mar-1956 (66 y.o. [name] Primary Care Provider: [name] Other Clinician: Referring Provider: Treating Provider/Extender: [name] in Treatment: 11 Verbal / Phone Orders: No Diagnosis Coding ICD-10 Coding Code Description [phone] Non-pressure chronic ulcer of left heel and midfoot with fat layer exposed E11.621 Type 2 diabetes mellitus with foot ulcer N18.6 End stage renal disease I50.9 Heart failure, unspecified I10  Essential (primary) hypertension I73.9 Peripheral vascular disease, unspecified L98.492 Non-pressure chronic ulcer of skin of other sites with fat layer exposed Follow-up Appointments ppointment in 2 weeks. - Dr Celine Ahr Room 3 05/25/22 at 10:15am Return A Bathing/ Shower/ Hygiene Other Bathing/Shower/Hygiene Orders/Instructions: - change wound dressing after bathing [place] please change wound dressing 2 times a week. Wound Treatment Wound #21 - Abdomen - midline Wound Laterality: Right, Proximal Cleanser: Soap and Water 3 x Per Week/30 Days Discharge Instructions: May shower and wash wound with dial antibacterial soap and water prior to dressing change. Cleanser: Wound Cleanser 3 x Per Week/30 Days Discharge Instructions: Cleanse the wound with wound cleanser prior to applying a clean dressing using gauze sponges, not tissue or cotton balls. Prim Dressing: KerraCel Ag Gelling Fiber Dressing, 2x2 in (silver alginate) 3 x Per Week/30 Days ary Discharge Instructions: Apply silver alginate to wound bed as instructed Secondary Dressing: Bordered Gauze, 2x2 in 3 x Per Week/30 Days Discharge Instructions: Apply over primary dressing as directed. Secondary Dressing: Woven Gauze Sponge, Non-Sterile 4x4 in 3 x Per Week/30 Days Discharge Instructions: Apply over primary dressing as directed. Secured With: Paper Tape, 2x10 (in/yd) 3 x Per Week/30 Days Discharge Instructions: Secure dressing with tape as directed. Electronic Signature(s) Signed: 05/13/2022 11:35:02 AM By: Fredirick Maudlin MD FACS Entered By: Fredirick Maudlin on 05/13/2022 11:35:02 -------------------------------------------------------------------------------- Problem List Details Patient Name: Date of Service: [name], [name] 05/13/2022 10:15 A M Medical Record Number: [id number] Patient Account Number: [id number] Date of Birth/Sex: Treating RN: Nov 06, 1955 (66 y.o.  M) Primary Care Provider:  [name] Other Clinician: Referring Provider: Treating Provider/Extender: [name] in Treatment: 11 Active Problems ICD-10 Encounter Code Description Active Date MDM Diagnosis L97.422 Non-pressure chronic ulcer of left heel and midfoot with fat layer exposed 02/22/2022 No Yes E11.621 Type 2 diabetes mellitus with foot ulcer 02/22/2022 No Yes N18.6 End stage renal disease 02/22/2022 No Yes I50.9 Heart failure, unspecified 02/22/2022 No Yes I10 Essential (primary) hypertension 02/22/2022 No Yes I73.9 Peripheral vascular disease, unspecified 02/22/2022 No Yes L98.492 Non-pressure chronic ulcer of skin of other sites with fat layer exposed 03/09/2022 No Yes Inactive Problems Resolved Problems Electronic Signature(s) Signed: 05/13/2022 11:21:04 AM By: Fredirick Maudlin MD FACS Entered By: Fredirick Maudlin on 05/13/2022 11:21:04 -------------------------------------------------------------------------------- Progress Note Details Patient Name: Date of Service: [name], [place] 05/13/2022 10:15 A M Medical Record Number: [id number] Patient Account Number: [id number] Date of Birth/Sex: Treating RN: [birth year] (66 y.o. M) Primary Care Provider: [name] Other Clinician: Referring Provider: Treating Provider/Extender: [name] in Treatment: 11 Subjective Chief Complaint Information obtained from Patient 11/09/2018; patient is here for review of wounds on his dorsal right first and second toes and on the dorsal left first toe. 02/22/2022: The patient is here for a new ulcer on his posterior left heel. History of Present Illness (HPI) READMISSION 11/09/2018 This is a now 66 year old man that we had in this clinic over a multitude of years but most recently in 2015. He had wounds on his plantar foot initially in 2007 and 2008 and I think subsequently was seen in 2012 then 2014 into the mid part of 2015 with  wounds on his toes. He is a type II diabetic with peripheral neuropathy but no known PAD. The patient states he was doing well until about a month ago he noted an area on the left great toe which was superficial and then an area on the right great and second toes about a week later. He is not sure how this happened he simply noticed this when he was lying in bed at night. He has been using peroxide. He has not seen a doctor. He has not been on antibiotics and has had no x-rays. Past medical history; type 2 diabetes with peripheral neuropathy, hypertension, hypothyroidism, low B12 levels, iron deficiency anemia, ventral hernia, diabetic nephropathy, chronic diastolic heart failure and obstructive sleep apnea. ABIs in this clinic were 1.2 on the right and 1.1 on the left 2/28; patient arrived last week for areas on his left first right first and right second toes. X-ray of the right foot did not show any osseous abnormality. Culture of the right great toe showed Staphylococcus aureus methicillin sensitive and he is on dicloxacillin which he started 3 days ago. 3/6; patient with wounds on his right first and second toes and left first toes. The area over the left first toe is extensive we have been using silver alginate. Culture grew MSSA and he is on dicloxacillin which I prescribed on the 25th. He is still taking this which I am not really sure of the reason. He expressed knowledge that this was 4 times a day he should have been out of this around March 2 if he picked it up on the 25th or they gave him the right number of pills. Nevertheless our nurses report more purulent drainage and odor 3/17-Patient returns for the 3 wounds the left first toe and the right first and second toe dorsal surfaces. The left first toe is closed up and healed, the right  first toe dorsal surface wound is extensive and appears the same as last time, no purulent drainage or odor reported per nurse, left second toe wound  is slightly smaller. He has completed the dicloxacillin as of today. Noted that his culture previously had grown MSSA. We have been using calcium alginate to the wounds. X-rays have been reviewed. ABIs were reviewed as well. 3/27; patient returns for review of wounds on his right first and right second toes. The wound on the left first toe is healed. 4/10; 2-week follow-up. The area on the right second toe dorsally is healed. Per the patient the left first toe remains healed. He still has an extensive area dorsally over the right first toe inner phalangeal joint area. Rolled up hyper granulated tissue with probably a nonviable surface 4/24; 2-week follow-up. The areas on the right second toe dorsally and right first toe dorsally is healed. The area on the dorsal left first toe over the inner phalangeal joint is smaller but still hyper granulated we have been using Hydrofera Blue 5/15; 3-week follow-up. The areas on the right first toe. Not the left as I said on 4/24. He is using Hydrofera Blue. 5/29; 3-week follow-up. The area on his dorsal right toe over the right inter phalangeal joint is just about closed. We have been using Hydrofera Blue. In passing he showed me his right thumb which is exquisitely tender over the tip of the digit. 6/5; the area on the dorsal right toe over the inner phalangeal joint is closed. Right thumb is better which I gave him antibiotics for last week. Still some tenderness but considerably improved READMISSION 02/22/2022 This is a 66 year old type II diabetic with congestive heart failure, end-stage renal disease on hemodialysis, peripheral artery disease, and morbid obesity. He has developed an ulcer on his left posterior heel that he believes is secondary to his health assistance not adequately moisturizing his feet. He says it first appeared about 2 weeks ago. He does not have sensation in his feet. He has not noticed any foul odor or drainage. His last hemoglobin A1c  was 7.9 at the beginning of January. ABI in clinic today was 1.15. 03/09/2022: He has a new wound on his abdomen that he says started out as a lump and then the skin broke open when he bumped it on a portion of his hospital bed at home. There is fat exposed. The wound on his heel is a little bit smaller but still has a very pale surface. There is periwound callus accumulation. 03/30/2022: He had another small wound opened up on his abdomen. He says that he thinks these are starting as blisters. He has been on Mounjaro for weight loss and it looks like the loose skin resulting from losing weight might be rubbing and causing small friction injuries. The wound that I treated with chemical cauterization last visit is much smaller; the new wound has a very similar appearance to this. His heel ulcer is smaller with just a little bit of slough buildup on the surface. He continues to have fairly impressive periwound callus formation. 04/07/2022: Both abdominal wounds are smaller today and there is less hypertrophic granulation tissue protruding. The wound on his heel also continues to contract. There is just a layer of slough on the surface. No concern for infection. 04/29/2022: The previous 2 abdominal wounds have closed but he has a new 1 that appears identical to the previous lesions. The wound on his heel is smaller but has a thick layer  of callus overlying it. 05/13/2022: His heel wound has healed. The wound on his abdomen remains small but still has some hypertrophic granulation tissue present. He says it drains serous fluid from time to time. Patient History Information obtained from Patient. Family History Cancer - Mother,Father, Diabetes - Mother,Father,Siblings, Heart Disease - Siblings, Lung Disease - Mother, No family history of Hereditary Spherocytosis, Hypertension, Kidney Disease, Seizures, Stroke, Thyroid Problems, Tuberculosis. Social History Never smoker, Marital Status - Single, Alcohol  Use - Never, Drug Use - No History, Caffeine Use - Daily - Coffee. Medical History Eyes Denies history of Cataracts, Glaucoma, Optic Neuritis Ear/Nose/Mouth/Throat Denies history of Chronic sinus problems/congestion, Middle ear problems Hematologic/Lymphatic Denies history of Anemia, Hemophilia, Human Immunodeficiency Virus, Lymphedema, Sickle Cell Disease Respiratory Patient has history of Sleep Apnea Denies history of Aspiration, Asthma, Chronic Obstructive Pulmonary Disease (COPD), Pneumothorax, Tuberculosis Cardiovascular Patient has history of Congestive Heart Failure, Hypertension Denies history of Angina, Arrhythmia, Coronary Artery Disease, Deep Vein Thrombosis, Hypotension, Myocardial Infarction, Peripheral Arterial Disease, Peripheral Venous Disease, Phlebitis, Vasculitis Gastrointestinal Denies history of Cirrhosis , Colitis, Crohnoos, Hepatitis A, Hepatitis B, Hepatitis C Endocrine Patient has history of Type II Diabetes Denies history of Type I Diabetes Genitourinary Denies history of End Stage Renal Disease Immunological Denies history of Lupus Erythematosus, Raynaudoos, Scleroderma Integumentary (Skin) Denies history of History of Burn Musculoskeletal Patient has history of Osteoarthritis Denies history of Gout, Rheumatoid Arthritis, Osteomyelitis Neurologic Patient has history of Neuropathy - Diabetic Denies history of Dementia, Quadriplegia, Paraplegia, Seizure Disorder Oncologic Denies history of Received Chemotherapy, Received Radiation Psychiatric Denies history of Anorexia/bulimia, Confinement Anxiety Objective Constitutional No acute distress.. Vitals Time Taken: 10:11 AM, Height: 72 in, Weight: 392 lbs, BMI: 53.2, Temperature: 98.3 F, Pulse: 66 bpm, Respiratory Rate: 18 breaths/min, Blood Pressure: 135/67 mmHg. Respiratory Normal work of breathing on room air.. General Notes: 05/13/2022: His heel wound has healed. The wound on his abdomen remains  small but still has some hypertrophic granulation tissue present. He says it drains serous fluid from time to time. Integumentary (Hair, Skin) Wound #20 status is Healed - Epithelialized. Original cause of wound was Gradually Appeared. The date acquired was: 02/08/2022. The wound has been in treatment 11 weeks. The wound is located on the Left,Lateral Calcaneus. The wound measures 0cm length x 0cm width x 0cm depth; 0cm^2 area and 0cm^3 volume. There is no tunneling or undermining noted. There is a none present amount of drainage noted. There is no granulation within the wound bed. There is no necrotic tissue within the wound bed. Wound #21 status is Open. Original cause of wound was Blister. The date acquired was: 03/03/2022. The wound has been in treatment 9 weeks. The wound is located on the Right,Proximal Abdomen - midline. The wound measures 0.3cm length x 0.3cm width x 0.5cm depth; 0.071cm^2 area and 0.035cm^3 volume. There is Fat Layer (Subcutaneous Tissue) exposed. There is no tunneling or undermining noted. There is a medium amount of serosanguineous drainage noted. There is medium (34-66%) red granulation within the wound bed. There is a medium (34-66%) amount of necrotic tissue within the wound bed including Adherent Slough. Assessment Active Problems ICD-10 Non-pressure chronic ulcer of left heel and midfoot with fat layer exposed Type 2 diabetes mellitus with foot ulcer End stage renal disease Heart failure, unspecified Essential (primary) hypertension Peripheral vascular disease, unspecified Non-pressure chronic ulcer of skin of other sites with fat layer exposed Procedures Wound #21 Pre-procedure diagnosis of Wound #21 is a Lesion located on the Right,Proximal Abdomen -  midline . An Chemical Cauterization procedure was performed by Fredirick Maudlin, MD. Post procedure Diagnosis Wound #21: Same as Pre-Procedure Plan Follow-up Appointments: Return Appointment in 2 weeks. - Dr  Celine Ahr Room 3 05/25/22 at 10:15am Bathing/ Shower/ Hygiene: Other Bathing/Shower/Hygiene Orders/Instructions: - change wound dressing after bathing Home Health: Other Home Health Orders/Instructions: - Home Health please change wound dressing 2 times a week. WOUND #21: - Abdomen - midline Wound Laterality: Right, Proximal Cleanser: Soap and Water 3 x Per Week/30 Days Discharge Instructions: May shower and wash wound with dial antibacterial soap and water prior to dressing change. Cleanser: Wound Cleanser 3 x Per Week/30 Days Discharge Instructions: Cleanse the wound with wound cleanser prior to applying a clean dressing using gauze sponges, not tissue or cotton balls. Prim Dressing: KerraCel Ag Gelling Fiber Dressing, 2x2 in (silver alginate) 3 x Per Week/30 Days ary Discharge Instructions: Apply silver alginate to wound bed as instructed Secondary Dressing: Bordered Gauze, 2x2 in 3 x Per Week/30 Days Discharge Instructions: Apply over primary dressing as directed. Secondary Dressing: Woven Gauze Sponge, Non-Sterile 4x4 in 3 x Per Week/30 Days Discharge Instructions: Apply over primary dressing as directed. Secured With: Paper Tape, 2x10 (in/yd) 3 x Per Week/30 Days Discharge Instructions: Secure dressing with tape as directed. 05/13/2022: His heel wound has healed. The wound on his abdomen remains small but still has some hypertrophic granulation tissue present. He says it drains serous fluid from time to time. I used silver nitrate to chemically cauterize the hypertrophic granulation tissue on his abdominal wound. We will continue to use silver alginate on this wound. We gave him a Prevalon boot to wear on his left foot and he was reminded to continue to offload his heels as he is at high risk of having recurrence of pressure ulcers due to his immobility and super morbid obesity. He will follow-up in 2 weeks time Electronic Signature(s) Signed: 05/13/2022 11:36:00 AM By: Fredirick Maudlin MD  FACS Entered By: Fredirick Maudlin on 05/13/2022 11:36:00 -------------------------------------------------------------------------------- HxROS Details Patient Name: Date of Service: [name], [name] 05/13/2022 10:15 A M Medical Record Number: [id number] Patient Account Number: [id number] Date of Birth/Sex: Treating RN: 23-Sep-1955 (66 y.o. M) Primary Care Provider: [name] Other Clinician: Referring Provider: Treating Provider/Extender: [name] in Treatment: 11 Information Obtained From Patient Eyes Medical History: Negative for: Cataracts; Glaucoma; Optic Neuritis Ear/Nose/Mouth/Throat Medical History: Negative for: Chronic sinus problems/congestion; Middle ear problems Hematologic/Lymphatic Medical History: Negative for: Anemia; Hemophilia; Human Immunodeficiency Virus; Lymphedema; Sickle Cell Disease Respiratory Medical History: Positive for: Sleep Apnea Negative for: Aspiration; Asthma; Chronic Obstructive Pulmonary Disease (COPD); Pneumothorax; Tuberculosis Cardiovascular Medical History: Positive for: Congestive Heart Failure; Hypertension Negative for: Angina; Arrhythmia; Coronary Artery Disease; Deep Vein Thrombosis; Hypotension; Myocardial Infarction; Peripheral Arterial Disease; Peripheral Venous Disease; Phlebitis; Vasculitis Gastrointestinal Medical History: Negative for: Cirrhosis ; Colitis; Crohns; Hepatitis A; Hepatitis B; Hepatitis C Endocrine Medical History: Positive for: Type II Diabetes Negative for: Type I Diabetes Time with diabetes: 1994 Treated with: Insulin Blood sugar tested every day: No Genitourinary Medical History: Negative for: End Stage Renal Disease Immunological Medical History: Negative for: Lupus Erythematosus; Raynauds; Scleroderma Integumentary (Skin) Medical History: Negative for: History of Burn Musculoskeletal Medical History: Positive for: Osteoarthritis Negative for: Gout; Rheumatoid  Arthritis; Osteomyelitis Neurologic Medical History: Positive for: Neuropathy - Diabetic Negative for: Dementia; Quadriplegia; Paraplegia; Seizure Disorder Oncologic Medical History: Negative for: Received Chemotherapy; Received Radiation Psychiatric Medical History: Negative for: Anorexia/bulimia; Confinement Anxiety Immunizations Pneumococcal Vaccine: Received Pneumococcal Vaccination: No Implantable  Devices No devices added Family and Social History Cancer: Yes - Mother,Father; Diabetes: Yes - Mother,Father,Siblings; Heart Disease: Yes - Siblings; Hereditary Spherocytosis: No; Hypertension: No; Kidney Disease: No; Lung Disease: Yes - Mother; Seizures: No; Stroke: No; Thyroid Problems: No; Tuberculosis: No; Never smoker; Marital Status - Single; Alcohol Use: Never; Drug Use: No History; Caffeine Use: Daily - Coffee; Financial Concerns: No; Food, Clothing or Shelter Needs: No; Support System Lacking: No; Transportation Concerns: No Electronic Signature(s) Signed: 05/13/2022 1:33:14 PM By: Fredirick Maudlin MD FACS Entered By: Fredirick Maudlin on 05/13/2022 11:29:03 -------------------------------------------------------------------------------- SuperBill Details Patient Name: Date of Service: [name], [name] 05/13/2022 Medical Record Number: [id number] Patient Account Number: [id number] Date of Birth/Sex: Treating RN: 28-Jul-1956 (66 y.o. M) Primary Care Provider: [name] Other Clinician: Referring Provider: Treating Provider/Extender: [name] in Treatment: 11 Diagnosis Coding ICD-10 Codes Code Description [phone] Non-pressure chronic ulcer of left heel and midfoot with fat layer exposed E11.621 Type 2 diabetes mellitus with foot ulcer N18.6 End stage renal disease I50.9 Heart failure, unspecified I10 Essential (primary) hypertension I73.9 Peripheral vascular disease, unspecified L98.492 Non-pressure chronic ulcer of skin of other sites  with fat layer exposed Physician Procedures : CPT4 Code Description Modifier [id number] - WC PHYS LEVEL 3 - EST PT ICD-10 Diagnosis Description L98.492 Non-pressure chronic ulcer of skin of other sites with fat layer exposed N18.6 End stage renal disease I50.9 Heart failure, unspecified I73.9  Peripheral vascular disease, unspecified Quantity: 1 Electronic Signature(s) Signed: 05/13/2022 11:36:32 AM By: Fredirick Maudlin MD FACS Entered By: Fredirick Maudlin on 05/13/2022 11:36:31

## 2022-05-13 NOTE — Progress Notes (Signed)
[name], [name] ([id number]) Visit Report for 05/13/2022 Arrival Information Details Patient Name: Date of Service: [name], [name] 05/13/2022 10:15 A M Medical Record Number: [id number] Patient Account Number: [id number] Date of Birth/Sex: Treating RN: 07/25/1956 (66 y.o. M) Primary Care Sharel Behne: [name] Other Clinician: Referring Jaiden Wahab: Treating Davionne Dowty/Extender: [name] in Treatment: 11 Visit Information History Since Last Visit Added or deleted any medications: No Patient Arrived: Wheel Chair Any new allergies or adverse reactions: No Arrival Time: 10:09 Had a fall or experienced change in No Accompanied By: self activities of daily living that may affect Transfer Assistance: None risk of falls: Patient Identification Verified: Yes Signs or symptoms of abuse/neglect since last visito No Secondary Verification Process Completed: Yes Hospitalized since last visit: No Patient Requires Transmission-Based Precautions: No Implantable device outside of the clinic excluding No Patient Has Alerts: No cellular tissue based products placed in the center since last visit: Has Dressing in Place as Prescribed: Yes Pain Present Now: Yes Electronic Signature(s) Signed: 05/13/2022 10:40:34 AM By: Sandre Kitty Entered By: Sandre Kitty on 05/13/2022 10:10:13 -------------------------------------------------------------------------------- Encounter Discharge Information Details Patient Name: Date of Service: [name], [name] 05/13/2022 10:15 A M Medical Record Number: [id number] Patient Account Number: [id number] Date of Birth/Sex: Treating RN: 26-Mar-1956 (66 y.o. [name] Primary Care Celinda Dethlefs: [name] Other Clinician: Referring Evalie Hargraves: Treating Baylin Gamblin/Extender: [name] in Treatment: 11 Encounter Discharge Information Items Discharge Condition: Stable Ambulatory Status: Wheelchair Discharge  Destination: Home Transportation: Private Auto Accompanied By: self Schedule Follow-up Appointment: Yes Clinical Summary of Care: Patient Declined Electronic Signature(s) Signed: 05/13/2022 6:24:42 PM By: [name] RN Entered By: [name] on 05/13/2022 18:24:20 -------------------------------------------------------------------------------- Lower Extremity Assessment Details Patient Name: Date of Service: [name], [name] 05/13/2022 10:15 A M Medical Record Number: [id number] Patient Account Number: [id number] Date of Birth/Sex: Treating RN: 18-Jan-1956 (66 y.o. [name] Primary Care Carla Rashad: [name] Other Clinician: Referring Mollye Guinta: Treating Acheron Sugg/Extender: [name] in Treatment: 11 Edema Assessment Assessed: [Left: No] [Right: No] Edema: [Left: Ye] [Right: s] Calf Left: Right: Point of Measurement: 37 cm From Medial Instep 47.2 cm Ankle Left: Right: Point of Measurement: 14 cm From Medial Instep 29.3 cm Electronic Signature(s) Signed: 05/13/2022 6:24:42 PM By: [name] RN Entered By: [name] on 05/13/2022 11:00:35 -------------------------------------------------------------------------------- Multi Wound Chart Details Patient Name: Date of Service: [name], [name] 05/13/2022 10:15 A M Medical Record Number: [id number] Patient Account Number: [id number] Date of Birth/Sex: Treating RN: August 08, 1956 (66 y.o. M) Primary Care Lula Kolton: [name] Other Clinician: Referring Airik Goodlin: Treating Kyrel Leighton/Extender: [name] in Treatment: 11 Vital Signs Height(in): 72 Pulse(bpm): 2 Weight(lbs): 392 Blood Pressure(mmHg): 135/67 Body Mass Index(BMI): 53.2 Temperature(F): 98.3 Respiratory Rate(breaths/min): 18 Photos: [N/A:N/A] Left, Lateral Calcaneus Right, Proximal Abdomen - midline N/A Wound Location: Gradually Appeared Blister N/A Wounding Event: Diabetic  Wound/Ulcer of the Lower Lesion N/A Primary Etiology: Extremity Sleep Apnea, Congestive Heart Sleep Apnea, Congestive Heart N/A Comorbid History: Failure, Hypertension, Type II Failure, Hypertension, Type II Diabetes, Osteoarthritis, NeuropathyDiabetes, Osteoarthritis, Neuropathy 02/08/2022 03/03/2022 N/A Date Acquired: 11 9 N/A Weeks of Treatment: Healed - Epithelialized Open N/A Wound Status: No No N/A Wound Recurrence: 0x0x0 0.3x0.3x0.5 N/A Measurements L x W x D (cm) 0 0.071 N/A A (cm) : rea 0 0.035 N/A Volume (cm) : 100.00% 49.60% N/A % Reduction in Area: 100.00% -150.00% N/A % Reduction in Volume: Grade 2 Full Thickness Without Exposed N/A Classification: Support Structures None Present Medium N/A Exudate  Amount: N/A Serosanguineous N/A Exudate Type: N/A red, brown N/A Exudate Color: None Present (0%) Medium (34-66%) N/A Granulation Amount: N/A Red N/A Granulation Quality: None Present (0%) Medium (34-66%) N/A Necrotic Amount: Fascia: No Fat Layer (Subcutaneous Tissue): Yes N/A Exposed Structures: Fat Layer (Subcutaneous Tissue): No Fascia: No Tendon: No Tendon: No Muscle: No Muscle: No Joint: No Joint: No Bone: No Bone: No Large (67-100%) Large (67-100%) N/A Epithelialization: N/A Chemical Cauterization N/A Procedures Performed: Treatment Notes Electronic Signature(s) Signed: 05/13/2022 11:21:10 AM By: Fredirick Maudlin MD FACS Entered By: Fredirick Maudlin on 05/13/2022 11:21:10 -------------------------------------------------------------------------------- Multi-Disciplinary Care Plan Details Patient Name: Date of Service: [name], [name] 05/13/2022 10:15 A M Medical Record Number: [id number] Patient Account Number: [id number] Date of Birth/Sex: Treating RN: Mar 08, 1956 (66 y.o. [name] Primary Care Diago Haik: [name] Other Clinician: Referring Lashaundra Lehrmann: Treating Natelie Ostrosky/Extender: [name] in  Treatment: 11 Active Inactive Wound/Skin Impairment Nursing Diagnoses: Impaired tissue integrity Goals: Patient/caregiver will verbalize understanding of skin care regimen Date Initiated: 02/22/2022 Target Resolution Date: 07/19/2022 Goal Status: Active Interventions: Assess ulceration(s) every visit Treatment Activities: Skin care regimen initiated : 02/22/2022 Notes: Electronic Signature(s) Signed: 05/13/2022 6:24:42 PM By: [name] RN Entered By: [name] on 05/13/2022 18:23:01 -------------------------------------------------------------------------------- Pain Assessment Details Patient Name: Date of Service: [name], [name] 05/13/2022 10:15 A M Medical Record Number: [id number] Patient Account Number: [id number] Date of Birth/Sex: Treating RN: May 15, 1956 (66 y.o. M) Primary Care Nolberto Cheuvront: [name] Other Clinician: Referring Noa Constante: Treating Renesmae Donahey/Extender: [name] in Treatment: 11 Active Problems Location of Pain Severity and Description of Pain Patient Has Paino Yes Site Locations Rate the pain. Current Pain Level: 5 Pain Management and Medication Current Pain Management: Electronic Signature(s) Signed: 05/13/2022 10:40:34 AM By: Sandre Kitty Entered By: Sandre Kitty on 05/13/2022 10:10:23 -------------------------------------------------------------------------------- Patient/Caregiver Education Details Patient Name: Date of Service: [name], [name] 8/25/2023andnbsp10:15 [place] Record Number: [id number] Patient Account Number: [id number] Date of Birth/Gender: Treating RN: [birth year] (66 y.o. [name] Primary Care Physician: [name] Other Clinician: Referring Physician: Treating Physician/Extender: [name] in Treatment: 11 Education Assessment Education Provided To: Patient Education Topics Provided Wound/Skin Impairment: Methods:  Explain/Verbal Responses: Return demonstration correctly Electronic Signature(s) Signed: 05/13/2022 6:24:42 PM By: [name] RN Entered By: [name] on 05/13/2022 18:23:15 -------------------------------------------------------------------------------- Wound Assessment Details Patient Name: Date of Service: [name], [name] 05/13/2022 10:15 A M Medical Record Number: [id number] Patient Account Number: [id number] Date of Birth/Sex: Treating RN: 1956-03-21 (66 y.o. M) Primary Care Carmon Sahli: [name] Other Clinician: Referring Christinamarie Tall: Treating Damyn Weitzel/Extender: [name] in Treatment: 11 Wound Status Wound Number: 20 Primary Diabetic Wound/Ulcer of the Lower Extremity Etiology: Wound Location: Left, Lateral Calcaneus Wound Healed - Epithelialized Wounding Event: Gradually Appeared Status: Date Acquired: 02/08/2022 Comorbid Sleep Apnea, Congestive Heart Failure, Hypertension, Type II Weeks Of Treatment: 11 History: Diabetes, Osteoarthritis, Neuropathy Clustered Wound: No Photos Wound Measurements Length: (cm) Width: (cm) Depth: (cm) Area: (cm) Volume: (cm) 0 % Reduction in Area: 100% 0 % Reduction in Volume: 100% 0 Epithelialization: Large (67-100%) 0 Tunneling: No 0 Undermining: No Wound Description Classification: Grade 2 Exudate Amount: None Present Foul Odor After Cleansing: No Slough/Fibrino No Wound Bed Granulation Amount: None Present (0%) Exposed Structure Necrotic Amount: None Present (0%) Fascia Exposed: No Fat Layer (Subcutaneous Tissue) Exposed: No Tendon Exposed: No Muscle Exposed: No Joint Exposed: No Bone Exposed: No Electronic Signature(s) Signed: 05/13/2022 6:24:42 PM By: [name] RN Entered By: Minus Liberty,  Mechele Claude on 05/13/2022 11:16:26 -------------------------------------------------------------------------------- Wound Assessment Details Patient Name: Date of Service: [name], [name]  05/13/2022 10:15 A M Medical Record Number: [id number] Patient Account Number: [id number] Date of Birth/Sex: Treating RN: 1956-08-05 (66 y.o. M) Primary Care Rozelle Caudle: [name] Other Clinician: Referring Malcom Selmer: Treating Zubair Lofton/Extender: [name] in Treatment: 11 Wound Status Wound Number: 21 Primary Lesion Etiology: Wound Location: Right, Proximal Abdomen - midline Wound Open Wounding Event: Blister Status: Date Acquired: 03/03/2022 Comorbid Sleep Apnea, Congestive Heart Failure, Hypertension, Type II Weeks Of Treatment: 9 History: Diabetes, Osteoarthritis, Neuropathy Clustered Wound: No Photos Wound Measurements Length: (cm) 0.3 Width: (cm) 0.3 Depth: (cm) 0.5 Area: (cm) 0.071 Volume: (cm) 0.035 % Reduction in Area: 49.6% % Reduction in Volume: -150% Epithelialization: Large (67-100%) Tunneling: No Undermining: No Wound Description Classification: Full Thickness Without Exposed Support Structures Exudate Amount: Medium Exudate Type: Serosanguineous Exudate Color: red, brown Foul Odor After Cleansing: No Slough/Fibrino No Wound Bed Granulation Amount: Medium (34-66%) Exposed Structure Granulation Quality: Red Fascia Exposed: No Necrotic Amount: Medium (34-66%) Fat Layer (Subcutaneous Tissue) Exposed: Yes Necrotic Quality: Adherent Slough Tendon Exposed: No Muscle Exposed: No Joint Exposed: No Bone Exposed: No Treatment Notes Wound #21 (Abdomen - midline) Wound Laterality: Right, Proximal Cleanser Soap and Water Discharge Instruction: May shower and wash wound with dial antibacterial soap and water prior to dressing change. Wound Cleanser Discharge Instruction: Cleanse the wound with wound cleanser prior to applying a clean dressing using gauze sponges, not tissue or cotton balls. Peri-Wound Care Topical Primary Dressing KerraCel Ag Gelling Fiber Dressing, 2x2 in (silver alginate) Discharge Instruction: Apply silver  alginate to wound bed as instructed Secondary Dressing Bordered Gauze, 2x2 in Discharge Instruction: Apply over primary dressing as directed. Woven Gauze Sponge, Non-Sterile 4x4 in Discharge Instruction: Apply over primary dressing as directed. Secured With Paper Tape, 2x10 (in/yd) Discharge Instruction: Secure dressing with tape as directed. Compression Wrap Compression Stockings Add-Ons Electronic Signature(s) Signed: 05/13/2022 6:24:42 PM By: [name] RN Entered By: [name] on 05/13/2022 11:02:05 -------------------------------------------------------------------------------- Vitals Details Patient Name: Date of Service: [name], [name] 05/13/2022 10:15 A M Medical Record Number: [id number] Patient Account Number: [id number] Date of Birth/Sex: Treating RN: 01-28-1956 (66 y.o. M) Primary Care Timika Muench: [name] Other Clinician: Referring Kylei Purington: Treating Gwenn Teodoro/Extender: [name] in Treatment: 11 Vital Signs Time Taken: 10:11 Temperature (F): 98.3 Height (in): 72 Pulse (bpm): 66 Weight (lbs): 392 Respiratory Rate (breaths/min): 18 Body Mass Index (BMI): 53.2 Blood Pressure (mmHg): 135/67 Reference Range: 80 - 120 mg / dl Electronic Signature(s) Signed: 05/13/2022 10:40:34 AM By: Sandre Kitty Entered By: Sandre Kitty on 05/13/2022 10:11:18

## 2022-05-25 ENCOUNTER — Encounter (HOSPITAL_BASED_OUTPATIENT_CLINIC_OR_DEPARTMENT_OTHER): Payer: Medicare Other | Attending: General Surgery | Admitting: General Surgery

## 2022-06-06 ENCOUNTER — Encounter (HOSPITAL_COMMUNITY): Payer: Self-pay | Admitting: Cardiology

## 2022-06-06 ENCOUNTER — Ambulatory Visit (HOSPITAL_COMMUNITY)
Admission: RE | Admit: 2022-06-06 | Discharge: 2022-06-06 | Disposition: A | Discharge status: Home / Self Care | Payer: Medicare Other | Source: Ambulatory Visit | Attending: Cardiology | Admitting: Cardiology

## 2022-06-06 VITALS — BP 120/70 | HR 65

## 2022-06-06 DIAGNOSIS — I5032 Chronic diastolic (congestive) heart failure: Secondary | ICD-10-CM | POA: Insufficient documentation

## 2022-06-06 DIAGNOSIS — G4733 Obstructive sleep apnea (adult) (pediatric): Secondary | ICD-10-CM | POA: Diagnosis not present

## 2022-06-06 DIAGNOSIS — E785 Hyperlipidemia, unspecified: Secondary | ICD-10-CM | POA: Diagnosis not present

## 2022-06-06 DIAGNOSIS — Z79899 Other long term (current) drug therapy: Secondary | ICD-10-CM | POA: Insufficient documentation

## 2022-06-06 DIAGNOSIS — N183 Chronic kidney disease, stage 3 unspecified: Secondary | ICD-10-CM | POA: Insufficient documentation

## 2022-06-06 DIAGNOSIS — I130 Hypertensive heart and chronic kidney disease with heart failure and stage 1 through stage 4 chronic kidney disease, or unspecified chronic kidney disease: Secondary | ICD-10-CM | POA: Insufficient documentation

## 2022-06-06 DIAGNOSIS — R0602 Shortness of breath: Secondary | ICD-10-CM | POA: Diagnosis present

## 2022-06-06 DIAGNOSIS — E1122 Type 2 diabetes mellitus with diabetic chronic kidney disease: Secondary | ICD-10-CM | POA: Insufficient documentation

## 2022-06-06 DIAGNOSIS — Z7984 Long term (current) use of oral hypoglycemic drugs: Secondary | ICD-10-CM | POA: Diagnosis not present

## 2022-06-06 DIAGNOSIS — M79643 Pain in unspecified hand: Secondary | ICD-10-CM | POA: Diagnosis not present

## 2022-06-06 DIAGNOSIS — Z794 Long term (current) use of insulin: Secondary | ICD-10-CM | POA: Diagnosis not present

## 2022-06-06 DIAGNOSIS — Z7901 Long term (current) use of anticoagulants: Secondary | ICD-10-CM | POA: Diagnosis not present

## 2022-06-06 LAB — COMPREHENSIVE METABOLIC PANEL
ALT: 16 U/L (ref 0–44)
AST: 16 U/L (ref 15–41)
Albumin: 3.1 g/dL — ABNORMAL LOW (ref 3.5–5.0)
Alkaline Phosphatase: 99 U/L (ref 38–126)
Anion gap: 5 (ref 5–15)
BUN: 28 mg/dL — ABNORMAL HIGH (ref 8–23)
CO2: 27 mmol/L (ref 22–32)
Calcium: 8.2 mg/dL — ABNORMAL LOW (ref 8.9–10.3)
Chloride: 110 mmol/L (ref 98–111)
Creatinine, Ser: 1.75 mg/dL — ABNORMAL HIGH (ref 0.61–1.24)
GFR, Estimated: 43 mL/min — ABNORMAL LOW (ref >60)
Glucose, Bld: 89 mg/dL (ref 70–99)
Potassium: 4 mmol/L (ref 3.5–5.1)
Sodium: 142 mmol/L (ref 135–145)
Total Bilirubin: 0.6 mg/dL (ref 0.3–1.2)
Total Protein: 6.9 g/dL (ref 6.5–8.1)

## 2022-06-06 LAB — LIPID PANEL
Cholesterol: 115 mg/dL (ref 0–200)
HDL: 26 mg/dL — ABNORMAL LOW (ref >40)
LDL Cholesterol: 80 mg/dL (ref 0–99)
Total CHOL/HDL Ratio: 4.4 RATIO
Triglycerides: 44 mg/dL (ref <150)
VLDL: 9 mg/dL (ref 0–40)

## 2022-06-06 MED ORDER — METOLAZONE 2.5 MG PO TABS: 2.5000 mg | ORAL_TABLET | ORAL | 3 refills | Status: DC

## 2022-06-06 MED ORDER — POTASSIUM CHLORIDE CRYS ER 20 MEQ PO TBCR: 20.0000 meq | EXTENDED_RELEASE_TABLET | ORAL | 3 refills | Status: DC

## 2022-06-06 MED ORDER — EMPAGLIFLOZIN 10 MG PO TABS: 10.0000 mg | ORAL_TABLET | Freq: Every day | ORAL | 11 refills | Status: DC

## 2022-06-06 NOTE — Progress Notes (Signed)
ID:  [name], DOB Aug 31, 1956, MRN [id number]   Provider location: [place] [facility] Type of Visit: Established patient   PCP:  [name], NP  Nephrology: HF Cardiologist:  Dr. Aundra Dubin  Chief Complaint: Heart Failure    History of Present Illness: [name] is a 66 y.o. male who has history of morbid obesity, diabetes II, and diastolic CHF. S/P Lap band procedure years ago.   Today he returns for HF follow up. He uses his electric wheelchair to get around.  Walks a little in his house, no dyspnea walking short distances.  Overall, minimally active.  He wakes up short of breath at times.  No using CPAP currently.  He has tingling/pain his hands and feet.  No chest pain.  He plans to start swimming.   ECG (personally reviewed): NSR, NW axis, nonspecific T wave flattening.   Labs (8/10): creatinine 1.1, LDL 94, HDL 34 Labs (8/11): K 4.8, creatinine 1.2, LDL 93, HDL 33 Labs (10/12): K 5.4, creatinine 1.5, LDL 95, HDL 42 Labs (8/14): LDL 115, HDL 42 Labs (9/14): BNP 68 Labs (10/14): K 4.5, creatinine 1.6 Labs (7/15): K 4.4, creatinine 1.3 Labs (8/15): LDL 136, HDL 37 Labs (11/17): K 4.6, creatinine 1.43, LDL 111, HDL 37 Labs (4/18): K 4.9, creatinine 1.35, hgb 10.5 Labs (1/19): creatinine 2.56 Labs (3/20): K 4, creatinine 1.88, hgb 10.2, HIV negative  Labs (7/20): K 4.7, creatinine 2.46 Labs (5/21): K 4, creatinine 2.04, LDL 122 Labs (10/21): Creatinine 1.8  Labs (7/22): K 4.2, creatinine 2.06 Labs (6/23): K 4.3, creatinine 2.02    Past Medical History: 1. Diabetes mellitus.  Has history of diabetic foot ulcer and diabetic peripheral neuropathy. .  2. Hyperlipidemia 3. Hypertension: ACEI cough.  4. Colon cancer: s/p sigmoid colectomy in 2007 5. Morbid obesity s/p lap band procedure.  6. Anemia 7. Obstructive Sleep Apnea: Using CPAP.  8. CHF: Diastolic.  Echo (5/12): EF 50-55%, mild LV dilation, mild LVH, moderate (grade II) diastolic  dysfunction.  Echo (9/14) with EF 50-55%, mild LVH, moderately dilated LV, RV poorly visualized. - Echo (6/19): EF 55-60%, RV poorly visualized.  9. PFTs (9/10): mild obstructive defect 10.  Dobutamine stress echo (9/10): EF 55%, mild LV hypertrophy, no significant valvular disease, no ischemia or infarction  11. CKD stage 3 12. Ventral hernia s/p repair.  13. Gout  Current Outpatient Medications  Medication Sig Dispense Refill   Accu-Chek Softclix Lancets lancets CHECK BLOOD SUGAR 2 TIMES  DAILY 300 each 3   Albuterol Sulfate (PROAIR RESPICLICK) 741 (90 Base) MCG/ACT AEPB Inhale 1 puff into the lungs 4 (four) times daily as needed. 3 each 1   allopurinol (ZYLOPRIM) 100 MG tablet Take 1 tablet (100 mg total) by mouth daily. 30 tablet 2   aspirin EC 81 MG tablet Take 1 tablet (81 mg total) by mouth daily. 90 tablet 1   atorvastatin (LIPITOR) 40 MG tablet Take 1 tablet (40 mg total) by mouth daily. 90 tablet 1   bisoprolol (ZEBETA) 10 MG tablet Take 1 tablet (10 mg total) by mouth every evening. 90 tablet 1   blood glucose meter kit and supplies KIT Inject 1 each into the skin 4 (four) times daily. Use to check blood sugar three times daily. Dispense Freestyle Libre according to insurance preference. DX: E11.8 2 each 1   blood glucose meter kit and supplies Dispense based on patient and insurance preference. Use up to four times daily as directed. (  FOR ICD-10 E10.9, E11.9). 1 each 0   Blood Glucose Monitoring Suppl (ACCU-CHEK AVIVA CONNECT) w/Device KIT 1 Act by Does not apply route 3 (three) times daily as needed. 1 kit 3   Cholecalciferol 50 MCG (2000 UT) TABS Take 1 tablet (2,000 Units total) by mouth daily. 90 tablet 1   clobetasol ointment (TEMOVATE) 0.05 % Apply topically 2 (two) times daily.     colchicine 0.6 MG tablet Take 0.6 mg by mouth daily.     Continuous Blood Gluc Sensor (FREESTYLE LIBRE 2 SENSOR) MISC 1 Act by Does not apply route daily. 2 each 5   empagliflozin (JARDIANCE) 10  MG TABS tablet Take 1 tablet (10 mg total) by mouth daily before breakfast. 30 tablet 11   ferrous sulfate 325 (65 FE) MG tablet Take 1 tablet (325 mg total) by mouth 2 (two) times daily with a meal. 180 tablet 1   furosemide (LASIX) 20 MG tablet Take 5 tablets (100 mg total) by mouth 2 (two) times daily. 120 tablet 1   gabapentin (NEURONTIN) 300 MG capsule Take 300 mg by mouth at bedtime.     gentamicin ointment (GARAMYCIN) 0.1 % Apply topically as directed.     Glucagon (GVOKE HYPOPEN 2-PACK) 1 MG/0.2ML SOAJ Inject 1 Act into the skin daily as needed. 2 mL 5   glucose blood (ACCU-CHEK AVIVA PLUS) test strip Use to test blood sugar twice daily. DX E11.8 200 strip 3   HYDROcodone-acetaminophen (NORCO) 10-325 MG tablet Take 1 tablet by mouth every 6 (six) hours as needed.     hydrocortisone 2.5 % cream Apply topically 2 (two) times daily.     insulin detemir (LEVEMIR) 100 UNIT/ML injection Inject 60 Units into the skin daily.     Insulin Pen Needle (ULTICARE SHORT PEN NEEDLES) 31G X 8 MM MISC USE TWO PEN NEEDLES DAILY WITH INSULIN PENS 200 each 2   levothyroxine (SYNTHROID) 75 MCG tablet Take 75 mcg by mouth daily.     losartan (COZAAR) 25 MG tablet TAKE 2 TABLETS BY MOUTH DAILY. 60 tablet 3   metolazone (ZAROXOLYN) 2.5 MG tablet Take 1 tablet (2.5 mg total) by mouth once a week. Every Tuesday 10 tablet 3   Multiple Vitamins-Minerals (MULTIVITAMIN ADULT PO) Take 1 tablet by mouth daily.     nystatin (MYCOSTATIN/NYSTOP) powder Apply 1 application topically 3 (three) times daily. 60 g 5   potassium chloride SA (KLOR-CON M20) 20 MEQ tablet Take 1 tablet (20 mEq total) by mouth once a week. Every Tuesday 10 tablet 3   RESTASIS 0.05 % ophthalmic emulsion PLACE 1 DROP INTO BOTH EYES TWICE A DAY 60 each 5   No current facility-administered medications for this encounter.    Allergies:   Shellfish allergy, Lisinopril, Relistor [methylnaltrexone bromide], and Lovastatin   Social History:  The patient   reports that he has never smoked. He has never used smokeless tobacco. He reports that he does not currently use alcohol. He reports that he does not use drugs.   Family History:  The patient's family history includes CAD in his mother; Congestive Heart Failure in his mother; Diabetes in his father and mother; Heart attack (age of onset: 7) in his brother; Heart failure in his brother; Hepatitis in his mother.   ROS:  Please see the history of present illness.   All other systems are personally reviewed and negative.   Exam:   BP 120/70   Pulse 65   SpO2 92%  General: NAD, obese.  Neck: JVP 9-10 cm, no thyromegaly or thyroid nodule.  Lungs: Clear to auscultation bilaterally with normal respiratory effort. CV: Nondisplaced PMI.  Heart regular S1/S2, no S3/S4, no murmur.  1+ ankle edema.  No carotid bruit.  Normal pedal pulses.  Abdomen: Soft, nontender, no hepatosplenomegaly, no distention.  Skin: Intact without lesions or rashes.  Neurologic: Alert and oriented x 3.  Psych: Normal affect. Extremities: No clubbing or cyanosis.  HEENT: Normal.   Recent Labs: 09/29/2021: B Natriuretic Peptide 116.2 10/07/2021: TSH 3.53 11/24/2021: Magnesium 2.4 11/27/2021: Hemoglobin 9.2; Platelets 164 06/06/2022: ALT 16; BUN 28; Creatinine, Ser 1.75; Potassium 4.0; Sodium 142  Personally reviewed   Wt Readings from Last 3 Encounters:  12/01/21 (!) 233.6 kg (514 lb 15.9 oz)  09/28/21 (!) 218.5 kg (481 lb 11.3 oz)  09/22/21 (!) 217.7 kg (480 lb)    ASSESSMENT AND PLAN: 1. Chronic diastolic CHF: Echo (0802) showed EF 55-60%.  NYHA class IIIb, symptoms confounded by obesity/deconditioning. He is volume overloaded on exam today.  - Continue Lasix 100 mg bid and will add metolazone 2.5 mg qwk on Tuesdays with KCl 20 mEq on Tuesdays.  BMET today and in 10 days.  - Continue bisoprolol 10 mg daily. - Continue losartan 50 mg daily.  - Restart Jardiance 10 mg daily (we are unsure why he stopped it).  - I will  arrange for repeat echo.  2. Obesity: Had Lap band but regained weight during COVID.  - He is willing to take semaglutide for weight loss.  I will refer to our pharmacy clinic.  3. HTN: Controlled today.  4. OSA: He is going to restart CPAP.   5. CKD: Stage 3.  - BMET today.  - Avoid NSAIDs 6. Hyperlipidemia: On atorvastatin.  - Lipids today.   Followup 3 wks with APP to reassess volume status.   Signed, [name], MD  06/06/2022   [facility] [address] [facility] [place] [address] [phone] (office) [phone] (fax)

## 2022-06-06 NOTE — Patient Instructions (Signed)
START Metolazone 2.'5mg'$  Every TUESDAY  START Potassium 20 meq every TUESDAY  Restart Jardiance 10 mg daily.  Labs done today, your results will be available in MyChart, we will contact you for abnormal readings.  Repeat blood work in 10 days.  Your physician has requested that you have an echocardiogram. Echocardiography is a painless test that uses sound waves to create images of your heart. It provides your doctor with information about the size and shape of your heart and how well your heart's chambers and valves are working. This procedure takes approximately one hour. There are no restrictions for this procedure.    You have been referred to the Pharmacist for Semaglutide. They will call you to arrange the appointment.  Your physician recommends that you schedule a follow-up appointment in: 3 weeks  If you have any questions or concerns before your next appointment please send [place] a message through [place] or call our office at [phone].    TO LEAVE A MESSAGE FOR THE NURSE SELECT OPTION 2, PLEASE LEAVE A MESSAGE INCLUDING: YOUR NAME DATE OF BIRTH CALL BACK NUMBER REASON FOR CALL**this is important as we prioritize the call backs  YOU WILL RECEIVE A CALL BACK THE SAME DAY AS LONG AS YOU CALL BEFORE 4:00 PM  At the [facility], you and your health needs are our priority. As part of our continuing mission to provide you with exceptional heart care, we have created designated Provider Care Teams. These Care Teams include your primary Cardiologist (physician) and Advanced Practice Providers (APPs- Physician Assistants and Nurse Practitioners) who all work together to provide you with the care you need, when you need it.   You may see any of the following providers on your designated Care Team at your next follow up: Dr Glori Bickers Dr [name] Dr. Roxana Hires, NP Lyda Jester, [place] [facility], [place] Forestine Na,  NP Audry Riles, PharmD   Please be sure to bring in all your medications bottles to every appointment.

## 2022-06-09 ENCOUNTER — Other Ambulatory Visit (HOSPITAL_COMMUNITY): Payer: Medicare Other

## 2022-06-27 ENCOUNTER — Telehealth (HOSPITAL_COMMUNITY): Payer: Self-pay

## 2022-06-27 NOTE — Telephone Encounter (Signed)
Called and was unable to leave a voice message to confirm/remind patient of their appointment at the [facility] on 06/28/22.   Patient reminded to bring all medications and/or complete list.

## 2022-06-28 ENCOUNTER — Ambulatory Visit (HOSPITAL_COMMUNITY)
Admission: RE | Admit: 2022-06-28 | Discharge: 2022-06-28 | Disposition: A | Discharge status: Home / Self Care | Payer: Medicare Other | Source: Ambulatory Visit | Attending: Nurse Practitioner | Admitting: Nurse Practitioner

## 2022-06-28 ENCOUNTER — Ambulatory Visit (HOSPITAL_BASED_OUTPATIENT_CLINIC_OR_DEPARTMENT_OTHER)
Admission: RE | Admit: 2022-06-28 | Discharge: 2022-06-28 | Disposition: A | Discharge status: Home / Self Care | Payer: Medicare Other | Source: Ambulatory Visit | Attending: Family Medicine | Admitting: Family Medicine

## 2022-06-28 ENCOUNTER — Encounter (HOSPITAL_COMMUNITY): Payer: Self-pay

## 2022-06-28 VITALS — BP 102/64 | HR 76 | Wt >= 6400 oz

## 2022-06-28 DIAGNOSIS — N183 Chronic kidney disease, stage 3 unspecified: Secondary | ICD-10-CM

## 2022-06-28 DIAGNOSIS — I130 Hypertensive heart and chronic kidney disease with heart failure and stage 1 through stage 4 chronic kidney disease, or unspecified chronic kidney disease: Secondary | ICD-10-CM | POA: Diagnosis present

## 2022-06-28 DIAGNOSIS — G4733 Obstructive sleep apnea (adult) (pediatric): Secondary | ICD-10-CM

## 2022-06-28 DIAGNOSIS — Z79899 Other long term (current) drug therapy: Secondary | ICD-10-CM | POA: Diagnosis not present

## 2022-06-28 DIAGNOSIS — E785 Hyperlipidemia, unspecified: Secondary | ICD-10-CM | POA: Insufficient documentation

## 2022-06-28 DIAGNOSIS — I10 Essential (primary) hypertension: Secondary | ICD-10-CM

## 2022-06-28 DIAGNOSIS — Z9049 Acquired absence of other specified parts of digestive tract: Secondary | ICD-10-CM | POA: Insufficient documentation

## 2022-06-28 DIAGNOSIS — I5032 Chronic diastolic (congestive) heart failure: Secondary | ICD-10-CM | POA: Diagnosis not present

## 2022-06-28 DIAGNOSIS — Z6841 Body Mass Index (BMI) 40.0 and over, adult: Secondary | ICD-10-CM | POA: Insufficient documentation

## 2022-06-28 DIAGNOSIS — E1122 Type 2 diabetes mellitus with diabetic chronic kidney disease: Secondary | ICD-10-CM | POA: Diagnosis not present

## 2022-06-28 DIAGNOSIS — Z7984 Long term (current) use of oral hypoglycemic drugs: Secondary | ICD-10-CM | POA: Insufficient documentation

## 2022-06-28 LAB — ECHOCARDIOGRAM COMPLETE
AR max vel: 2.34 cm2
AV Peak grad: 12.8 mmHg
Ao pk vel: 1.79 m/s
Area-P 1/2: 3.56 cm2
S' Lateral: 5.3 cm
Single Plane A4C EF: 43.2 %

## 2022-06-28 LAB — BASIC METABOLIC PANEL
Anion gap: 10 (ref 5–15)
BUN: 32 mg/dL — ABNORMAL HIGH (ref 8–23)
CO2: 28 mmol/L (ref 22–32)
Calcium: 9.2 mg/dL (ref 8.9–10.3)
Chloride: 100 mmol/L (ref 98–111)
Creatinine, Ser: 2 mg/dL — ABNORMAL HIGH (ref 0.61–1.24)
GFR, Estimated: 37 mL/min — ABNORMAL LOW (ref >60)
Glucose, Bld: 201 mg/dL — ABNORMAL HIGH (ref 70–99)
Potassium: 4.2 mmol/L (ref 3.5–5.1)
Sodium: 138 mmol/L (ref 135–145)

## 2022-06-28 LAB — BRAIN NATRIURETIC PEPTIDE: B Natriuretic Peptide: 242.4 pg/mL — ABNORMAL HIGH (ref 0.0–100.0)

## 2022-06-28 MED ORDER — PERFLUTREN LIPID MICROSPHERE
1.0000 mL | INTRAVENOUS | Status: DC | PRN
  Administered 2022-06-28: 4 mL via INTRAVENOUS

## 2022-06-28 NOTE — Progress Notes (Addendum)
I reviewed instructions for patient to complete ordered home sleep study.  I have sent message to clinic CMA to assess for insurance prior auth.  He will await to proceed until he hears from our clinic.

## 2022-06-28 NOTE — Progress Notes (Signed)
ID:  [name], DOB [birth year], MRN [id number]   Provider location: [place] [facility] Type of Visit: Established patient   PCP:  [name], NP  HF Cardiologist:  Dr. Aundra Dubin  Chief Complaint: Heart Failure    HPI [name] is a 66 y.o. male who has history of morbid obesity, diabetes II, and diastolic CHF. S/P Lap band procedure years ago.   Follow up 8/23, NYHA III and volume overloaded. Metolazone 2.5 added once a week and echo arranged, referred to pharmacy for semaglutide.  Today he returns for HF follow up. Overall feeling fine. He uses an IT trainer wheelchair when outside of his house. Uses a walker when inside his home, no significant dyspnea with walking short distances. Sleeping poorly and has new PND. Chronically sleeps reclined in an adjustable bed. Continues with LE swelling. Denies palpitations, CP, dizziness, or abnormal bleeding. Appetite ok. No fever or chills. He does not weigh at home. Taking all medications. He does not wear CPAP.  Echo today, results pending.  ECG (personally reviewed): none ordered today.   Labs (8/10): creatinine 1.1, LDL 94, HDL 34 Labs (8/11): K 4.8, creatinine 1.2, LDL 93, HDL 33 Labs (10/12): K 5.4, creatinine 1.5, LDL 95, HDL 42 Labs (8/14): LDL 115, HDL 42 Labs (9/14): BNP 68 Labs (10/14): K 4.5, creatinine 1.6 Labs (7/15): K 4.4, creatinine 1.3 Labs (8/15): LDL 136, HDL 37 Labs (11/17): K 4.6, creatinine 1.43, LDL 111, HDL 37 Labs (4/18): K 4.9, creatinine 1.35, hgb 10.5 Labs (1/19): creatinine 2.56 Labs (3/20): K 4, creatinine 1.88, hgb 10.2, HIV negative  Labs (7/20): K 4.7, creatinine 2.46 Labs (5/21): K 4, creatinine 2.04, LDL 122 Labs (10/21): Creatinine 1.8  Labs (7/22): K 4.2, creatinine 2.06 Labs (6/23): K 4.3, creatinine 2.02 Labs (9/23): K 4.0, creatinine 1.75, normal LFTs, LDL 80, HDL 26    Past Medical History: 1. Diabetes mellitus.  Has history of diabetic foot ulcer and diabetic  peripheral neuropathy. .  2. Hyperlipidemia 3. Hypertension: ACEI cough.  4. Colon cancer: s/p sigmoid colectomy in 2007 5. Morbid obesity s/p lap band procedure.  6. Anemia 7. Obstructive Sleep Apnea: Using CPAP.  8. CHF: Diastolic.  Echo (5/12): EF 50-55%, mild LV dilation, mild LVH, moderate (grade II) diastolic dysfunction.  Echo (9/14) with EF 50-55%, mild LVH, moderately dilated LV, RV poorly visualized. - Echo (6/19): EF 55-60%, RV poorly visualized.  9. PFTs (9/10): mild obstructive defect 10.  Dobutamine stress echo (9/10): EF 55%, mild LV hypertrophy, no significant valvular disease, no ischemia or infarction  11. CKD stage 3 12. Ventral hernia s/p repair.  13. Gout  Current Outpatient Medications  Medication Sig Dispense Refill   Accu-Chek Softclix Lancets lancets CHECK BLOOD SUGAR 2 TIMES  DAILY 300 each 3   Albuterol Sulfate (PROAIR RESPICLICK) 703 (90 Base) MCG/ACT AEPB Inhale 1 puff into the lungs 4 (four) times daily as needed. 3 each 1   allopurinol (ZYLOPRIM) 100 MG tablet Take 1 tablet (100 mg total) by mouth daily. 30 tablet 2   aspirin EC 81 MG tablet Take 1 tablet (81 mg total) by mouth daily. 90 tablet 1   atorvastatin (LIPITOR) 40 MG tablet Take 1 tablet (40 mg total) by mouth daily. 90 tablet 1   bisoprolol (ZEBETA) 10 MG tablet Take 1 tablet (10 mg total) by mouth every evening. 90 tablet 1   blood glucose meter kit and supplies Dispense based on patient and insurance preference. Use  up to four times daily as directed. (FOR ICD-10 E10.9, E11.9). 1 each 0   Blood Glucose Monitoring Suppl (ACCU-CHEK AVIVA CONNECT) w/Device KIT 1 Act by Does not apply route 3 (three) times daily as needed. 1 kit 3   clobetasol ointment (TEMOVATE) 0.05 % Apply topically 2 (two) times daily.     colchicine 0.6 MG tablet Take 0.6 mg by mouth daily.     empagliflozin (JARDIANCE) 10 MG TABS tablet Take 1 tablet (10 mg total) by mouth daily before breakfast. 30 tablet 11   ferrous sulfate  325 (65 FE) MG tablet Take 1 tablet (325 mg total) by mouth 2 (two) times daily with a meal. 180 tablet 1   furosemide (LASIX) 20 MG tablet Take 5 tablets (100 mg total) by mouth 2 (two) times daily. 120 tablet 1   gentamicin ointment (GARAMYCIN) 0.1 % Apply topically as directed.     glucose blood (ACCU-CHEK AVIVA PLUS) test strip Use to test blood sugar twice daily. DX E11.8 200 strip 3   HYDROcodone-acetaminophen (NORCO) 10-325 MG tablet Take 1 tablet by mouth every 6 (six) hours as needed.     hydrocortisone 2.5 % cream Apply topically 2 (two) times daily.     insulin detemir (LEVEMIR) 100 UNIT/ML injection Inject 60 Units into the skin daily.     Insulin Pen Needle (ULTICARE SHORT PEN NEEDLES) 31G X 8 MM MISC USE TWO PEN NEEDLES DAILY WITH INSULIN PENS 200 each 2   levothyroxine (SYNTHROID) 75 MCG tablet Take 75 mcg by mouth daily.     losartan (COZAAR) 25 MG tablet TAKE 2 TABLETS BY MOUTH DAILY. 60 tablet 3   metolazone (ZAROXOLYN) 2.5 MG tablet Take 1 tablet (2.5 mg total) by mouth once a week. Every Tuesday 10 tablet 3   Multiple Vitamins-Minerals (MULTIVITAMIN ADULT PO) Take 1 tablet by mouth daily.     nystatin (MYCOSTATIN/NYSTOP) powder Apply 1 application topically 3 (three) times daily. 60 g 5   potassium chloride SA (KLOR-CON M20) 20 MEQ tablet Take 1 tablet (20 mEq total) by mouth once a week. Every Tuesday 10 tablet 3   RESTASIS 0.05 % ophthalmic emulsion PLACE 1 DROP INTO BOTH EYES TWICE A DAY 60 each 5   blood glucose meter kit and supplies KIT Inject 1 each into the skin 4 (four) times daily. Use to check blood sugar three times daily. Dispense Freestyle Libre according to insurance preference. DX: E11.8 (Patient not taking: Reported on 06/28/2022) 2 each 1   Continuous Blood Gluc Sensor (FREESTYLE LIBRE 2 SENSOR) MISC 1 Act by Does not apply route daily. (Patient not taking: Reported on 06/28/2022) 2 each 5   No current facility-administered medications for this encounter.    Facility-Administered Medications Ordered in Other Encounters  Medication Dose Route Frequency Provider Last Rate Last Admin   perflutren lipid microspheres (DEFINITY) IV suspension  1-10 mL Intravenous PRN Larey Dresser, MD   4 mL at 06/28/22 1356    Allergies:   Shellfish allergy, Lisinopril, Relistor [methylnaltrexone bromide], and Lovastatin   Social History:  The patient  reports that he has never smoked. He has never used smokeless tobacco. He reports that he does not currently use alcohol. He reports that he does not use drugs.   Family History:  The patient's family history includes CAD in his mother; Congestive Heart Failure in his mother; Diabetes in his father and mother; Heart attack (age of onset: 69) in his brother; Heart failure in his brother; Hepatitis in  his mother.   ROS:  Please see the history of present illness.   All other systems are personally reviewed and negative.   Exam:   BP 102/64   Pulse 76   Wt (!) 189.8 kg (418 lb 6.4 oz)   SpO2 95%   BMI 55.20 kg/m  General:  NAD. No resp difficulty, arrived in electric wheelchair. HEENT: Normal Neck: Supple. Thick neck. Carotids 2+ bilat; no bruits. No lymphadenopathy or thryomegaly appreciated. Cor: PMI nondisplaced. Regular rate & rhythm. No rubs, gallops or murmurs. Lungs: Clear Abdomen: Obese, nontender, nondistended. No hepatosplenomegaly. No bruits or masses. Good bowel sounds. Extremities: No cyanosis, clubbing, rash, 1+ pretibial BLE edema, R>L Neuro: Alert & oriented x 3, cranial nerves grossly intact. Moves all 4 extremities w/o difficulty. Affect pleasant.  Recent Labs: 09/29/2021: B Natriuretic Peptide 116.2 10/07/2021: TSH 3.53 11/24/2021: Magnesium 2.4 11/27/2021: Hemoglobin 9.2; Platelets 164 06/06/2022: ALT 16; BUN 28; Creatinine, Ser 1.75; Potassium 4.0; Sodium 142  Personally reviewed   Wt Readings from Last 3 Encounters:  06/28/22 (!) 189.8 kg (418 lb 6.4 oz)  12/01/21 (!) 233.6 kg (514 lb  15.9 oz)  09/28/21 (!) 218.5 kg (481 lb 11.3 oz)    ASSESSMENT AND PLAN: 1. Chronic diastolic CHF: Echo (7948) showed EF 55-60%.  NYHA class III, symptoms confounded by obesity/deconditioning. He does not appear markedly volume overloaded today. - Continue Lasix 100 mg bid + metolazone 2.5 mg qwk on Tuesdays with KCl 20 mEq on Tuesdays.  BMET/BNP today. - Continue bisoprolol 10 mg daily. - Continue losartan 50 mg daily.  - Continue Jardiance 10 mg daily. - Repeat echo today, results pending. 2. Obesity: Had Lap band but regained weight during COVID. Body mass index is 55.2 kg/m. - He is willing to take semaglutide for weight loss.  He has been referred to our pharmacy clinic.  3. HTN: Controlled today.  4. OSA: He sent his CPAP back. Last sleep study 3 years ago. - Arrange for home sleep study. 5. CKD: Stage 3.  - BMET today.  - Avoid NSAIDs 6. Hyperlipidemia: On atorvastatin.  - Good lipids 9/23.  Follow up in 6 months with Dr. Aundra Dubin.  Signed, Rafael Bihari, FNP  06/28/2022   [facility] [address] [facility] [place] [address] [phone] (office) [phone] (fax)

## 2022-06-28 NOTE — Addendum Note (Signed)
Encounter addended by: Micki Riley, RN on: 06/28/2022 3:00 PM  Actions taken: Clinical Note Signed

## 2022-06-28 NOTE — Progress Notes (Signed)
Patient Name: [name]        DOB: 25-Mar-1956       Height: '6\' 1"'$     Weight: 418 lb  Office Name: [facility]          Referring Provider: [name], MD/ Allena Katz, NP  Today's Date: 10/10*2023  STOP BANG RISK ASSESSMENT S (snore) Have you been told that you snore?     NO   T (tired) Are you often tired, fatigued, or sleepy during the day?   YES  O (obstruction) Do you stop breathing, choke, or gasp during sleep? YES   P (pressure) Do you have or are you being treated for high blood pressure? YES   B (BMI) Is your body index greater than 35 kg/m? YES   A (age) Are you 2 years old or older? YES   N (neck) Do you have a neck circumference greater than 16 inches?   YES   G (gender) Are you a male? YES   TOTAL STOP/BANG "YES" ANSWERS                                                                        For Office Use Only              Procedure Order Form    YES to 3+ Stop Bang questions OR two clinical symptoms - patient qualifies for WatchPAT (CPT 95800)             Clinical Notes: Will consult Sleep Specialist and refer for management of therapy due to patient increased risk of Sleep Apnea. Ordering a sleep study due to the following two clinical symptoms: Excessive daytime sleepiness G47.10 Unrefreshed by sleep G47.8 /History of high blood pressure R03.0   I understand that I am proceeding with a home sleep apnea test as ordered by my treating physician. I understand that untreated sleep apnea is a serious cardiovascular risk factor and it is my responsibility to perform the test and seek management for sleep apnea. I will be contacted with the results and be managed for sleep apnea by a local sleep physician. I will be receiving equipment and further instructions from [facility]. I shall promptly ship back the equipment via the included mailing label. I understand my insurance will be billed for the test and as the patient I am  responsible for any insurance related out-of-pocket costs incurred. I have been provided with written instructions and can call for additional video or telephonic instruction, with 24-hour availability of qualified personnel to answer any questions: Patient Help Desk [phone].  Patient Signature ______________________________________________________   Date______________________ Patient Telemedicine Verbal Consent

## 2022-06-28 NOTE — Addendum Note (Signed)
Encounter addended by: Kerry Dory, CMA on: 06/28/2022 2:45 PM  Actions taken: Clinical Note Signed

## 2022-06-28 NOTE — Addendum Note (Signed)
Encounter addended by: Kerry Dory, CMA on: 06/28/2022 3:12 PM  Actions taken: Clinical Note Signed

## 2022-06-28 NOTE — Patient Instructions (Addendum)
It was great to see you today! No medication changes are needed at this time.  Labs today We will only contact you if something comes back abnormal or we need to make some changes. Otherwise no news is good news!  Your provider has recommended that you have a home sleep study.  This has to be approved by your insurance company. We will schedule you an appointment to pick up the equipment to give [place] time to complete this authorization. Once you have the equipment you will download the app on your phone and follow the instructions. YOUR PIN NUMBER IS: [id number]. Once you have completed the test the information is sent to the company through [employer] and you can dispose of the equipment. If your test is positive you will receive a call from Dr Theodosia Blender office [facility]) to set up your CPAP equipment.  Your physician wants you to follow-up in: 6 months with Dr Aundra Dubin (April 2024) You will receive a reminder letter in the mail two months in advance. If you don't receive a letter by February 2024, please call our office to schedule the follow-up appointment.   Do the following things EVERYDAY: Weigh yourself in the morning before breakfast. Write it down and keep it in a log. Take your medicines as prescribed Eat low salt foods--Limit salt (sodium) to 2000 mg per day.  Stay as active as you can everyday Limit all fluids for the day to less than 2 liters  At the [facility], you and your health needs are our priority. As part of our continuing mission to provide you with exceptional heart care, we have created designated Provider Care Teams. These Care Teams include your primary Cardiologist (physician) and Advanced Practice Providers (APPs- Physician Assistants and Nurse Practitioners) who all work together to provide you with the care you need, when you need it.   You may see any of the following providers on your designated Care Team at your next follow up: Dr Glori Bickers Dr [name] Dr. Roxana Hires, NP Lyda Jester, [place] [facility] [place], [place] Forestine Na, NP Audry Riles, PharmD   Please be sure to bring in all your medications bottles to every appointment.   If you have any questions or concerns before your next appointment please send [place] a message through [place] or call our office at [phone].    TO LEAVE A MESSAGE FOR THE NURSE SELECT OPTION 2, PLEASE LEAVE A MESSAGE INCLUDING: YOUR NAME DATE OF BIRTH CALL BACK NUMBER REASON FOR CALL**this is important as we prioritize the call backs  YOU WILL RECEIVE A CALL BACK THE SAME DAY AS LONG AS YOU CALL BEFORE 4:00 PM

## 2022-06-29 ENCOUNTER — Telehealth (HOSPITAL_COMMUNITY): Payer: Self-pay

## 2022-06-29 NOTE — Telephone Encounter (Addendum)
Pt aware, agreeable, and verbalized understanding Denies any chest pain   ----- Message from Larey Dresser, MD sent at 06/28/2022  4:33 PM EDT ----- EF lower at 45%.  However, he would be marginal cath candidate with weight and CKD.  Would see if he has any worrisome chest pain.

## 2022-06-30 ENCOUNTER — Telehealth (HOSPITAL_COMMUNITY): Payer: Self-pay | Admitting: Surgery

## 2022-06-30 NOTE — Telephone Encounter (Signed)
I called patient and informed him that he can proceed with ordered home sleep study as insurance pre cert is not needed.

## 2022-06-30 NOTE — Telephone Encounter (Signed)
-----   Message from Harvie Junior, [place] sent at 06/29/2022 11:10 AM EDT ----- Regarding: RE: home sleep study No pre cert reqd  ----- Message ----- From: Micki Riley, RN Sent: 06/28/2022   2:51 PM EDT To: Harvie Junior, CMA Subject: home sleep study                               Precert?

## 2022-07-09 ENCOUNTER — Other Ambulatory Visit: Payer: Self-pay | Admitting: Internal Medicine

## 2022-07-11 ENCOUNTER — Ambulatory Visit: Payer: Medicare Other | Admitting: Podiatry

## 2022-07-16 ENCOUNTER — Other Ambulatory Visit (HOSPITAL_COMMUNITY): Payer: Self-pay | Admitting: Cardiology

## 2022-07-18 ENCOUNTER — Encounter: Payer: Self-pay | Admitting: Podiatry

## 2022-07-18 ENCOUNTER — Ambulatory Visit (INDEPENDENT_AMBULATORY_CARE_PROVIDER_SITE_OTHER): Payer: Medicare Other | Admitting: Podiatry

## 2022-07-18 DIAGNOSIS — E1142 Type 2 diabetes mellitus with diabetic polyneuropathy: Secondary | ICD-10-CM | POA: Diagnosis not present

## 2022-07-18 DIAGNOSIS — B351 Tinea unguium: Secondary | ICD-10-CM

## 2022-07-18 DIAGNOSIS — M79675 Pain in left toe(s): Secondary | ICD-10-CM | POA: Diagnosis not present

## 2022-07-18 DIAGNOSIS — M79674 Pain in right toe(s): Secondary | ICD-10-CM | POA: Diagnosis not present

## 2022-07-18 NOTE — Progress Notes (Signed)
  Subjective:  Patient ID: [name], male    DOB: 1956-07-17,  MRN: [id number]  [name] presents to clinic today for at risk foot care with h/o diabetic neuropathy. He has h/o diabetic ulcer posterior left heel which was noted to be resolved on his visit to Dr. Celine [name] on 05/13/2022. Chief Complaint  Patient presents with   Nail Problem    DFC  BG - 110 yesterday morning  A1C - pt does not recall PCP - [name] , last OV 07/15/22   . New problem(s): None.   PCP is [name] Hatchet, NP.  Allergies  Allergen Reactions   Shellfish Allergy Anaphylaxis   Lisinopril Cough   Relistor [Methylnaltrexone Bromide] Other (See Comments)    confusion   Lovastatin Itching    Review of Systems: Negative except as noted in the HPI.  Objective:   [name] is a pleasant 66 y.o. male morbidly obese in NAD. AAO x 3.  Vascular Examination: Vascular status intact b/l with palpable pedal pulses. Pedal hair absent b/l.  CFT immediate b/l. No pain with calf compression b/l. Skin temperature gradient WNL b/l. Dependent edema noted b/l LE.  Neurological Examination: Protective sensation diminished with 10g monofilament b/l. Vibratory sensation diminished b/l.  Dermatological Examination: Pedal skin with normal turgor, texture and tone b/l. No open wounds b/l. No hyperkeratotic lesions noted b/l.   Toenails 1-5 b/l elongated, discolored, dystrophic, thickened, and crumbly with subungual debris and tenderness to dorsal palpation.   No hyperkeratotic nor porokeratotic lesions present on today's visit.  Musculoskeletal Examination: Muscle strength 5/5 to b/l LE. Pes planus foot deformity b/l. Utilizes motorized chair for mobility assistance.  Radiographs: None Assessment/Plan: 1. Pain due to onychomycosis of toenails of both feet   2. Diabetic peripheral neuropathy ([place])     No orders of the defined types were placed in this encounter.   -Consent given for treatment as  described below: -Examined patient. -Continue diabetic foot care principles: inspect feet daily, monitor glucose as recommended by PCP and/or Endocrinologist, and follow prescribed diet per PCP, Endocrinologist and/or dietician. -Mycotic toenails 1-5 right foot and 1-4 left foot were debrided in length and girth with sterile nail nippers and dremel without iatrogenic bleeding. -Patient/POA to call should there be question/concern in the interim.   Return in about 3 months (around 10/18/2022).  Marzetta Board, DPM

## 2022-07-22 ENCOUNTER — Encounter: Payer: Self-pay | Admitting: Internal Medicine

## 2022-07-27 ENCOUNTER — Ambulatory Visit: Payer: Medicare Other

## 2022-08-09 ENCOUNTER — Telehealth (HOSPITAL_COMMUNITY): Payer: Self-pay | Admitting: Surgery

## 2022-08-09 NOTE — Telephone Encounter (Signed)
I attempted to reach patient in reference to ordered home sleep study.  I left a message to remind him to complete the study or return for credit.

## 2022-08-18 ENCOUNTER — Encounter (HOSPITAL_BASED_OUTPATIENT_CLINIC_OR_DEPARTMENT_OTHER): Payer: Medicare Other | Attending: General Surgery | Admitting: General Surgery

## 2022-08-18 DIAGNOSIS — M1990 Unspecified osteoarthritis, unspecified site: Secondary | ICD-10-CM | POA: Insufficient documentation

## 2022-08-18 DIAGNOSIS — N186 End stage renal disease: Secondary | ICD-10-CM | POA: Insufficient documentation

## 2022-08-18 DIAGNOSIS — L97429 Non-pressure chronic ulcer of left heel and midfoot with unspecified severity: Secondary | ICD-10-CM | POA: Diagnosis not present

## 2022-08-18 DIAGNOSIS — E1151 Type 2 diabetes mellitus with diabetic peripheral angiopathy without gangrene: Secondary | ICD-10-CM | POA: Diagnosis not present

## 2022-08-18 DIAGNOSIS — E11622 Type 2 diabetes mellitus with other skin ulcer: Secondary | ICD-10-CM | POA: Insufficient documentation

## 2022-08-18 DIAGNOSIS — I5032 Chronic diastolic (congestive) heart failure: Secondary | ICD-10-CM | POA: Insufficient documentation

## 2022-08-18 DIAGNOSIS — I132 Hypertensive heart and chronic kidney disease with heart failure and with stage 5 chronic kidney disease, or end stage renal disease: Secondary | ICD-10-CM | POA: Insufficient documentation

## 2022-08-18 DIAGNOSIS — Z992 Dependence on renal dialysis: Secondary | ICD-10-CM | POA: Insufficient documentation

## 2022-08-18 DIAGNOSIS — E1142 Type 2 diabetes mellitus with diabetic polyneuropathy: Secondary | ICD-10-CM | POA: Insufficient documentation

## 2022-08-18 DIAGNOSIS — D509 Iron deficiency anemia, unspecified: Secondary | ICD-10-CM | POA: Insufficient documentation

## 2022-08-18 DIAGNOSIS — E039 Hypothyroidism, unspecified: Secondary | ICD-10-CM | POA: Insufficient documentation

## 2022-08-18 DIAGNOSIS — E1122 Type 2 diabetes mellitus with diabetic chronic kidney disease: Secondary | ICD-10-CM | POA: Insufficient documentation

## 2022-08-19 NOTE — Progress Notes (Signed)
[name], [name] ([id number]) [id number] Page 1 of 8 Visit Report for 08/18/2022 Allergy List Details Patient Name: Date of Service: [name], [name] 08/18/2022 1:30 PM Medical Record Number: [id number] Patient Account Number: [id number] Date of Birth/Sex: Treating RN: 1956-04-16 (66 y.o. [name] Primary Care [name]: [name] Other Clinician: Referring [name]: Treating [name]/Extender: [name] Weeks in Treatment: 0 Allergies Active Allergies Shellfish Containing Products Reaction: anaphylaxis Severity: Severe lisinopril Reaction: Cough Severity: Moderate lovastatin Severity: Severe Relistor Severity: Severe Type: Allergen Allergy Notes Electronic Signature(s) Signed: 08/18/2022 5:59:30 PM By: [name] Catholic RN Entered By: [name] on 08/18/2022 13:03:01 -------------------------------------------------------------------------------- Arrival Information Details Patient Name: Date of Service: [name], [name] 08/18/2022 1:30 PM Medical Record Number: [id number] Patient Account Number: [id number] Date of Birth/Sex: Treating RN: [birth year] (66 y.o. [name] Primary Care [name]: [name] Other Clinician: Referring [name]: Treating [name]/Extender: [name] in Treatment: 0 Visit Information Patient Arrived: Wheel Chair Arrival Time: 12:57 Accompanied By: self Transfer Assistance: None Patient Identification VerifiedKYRIN, [name] ([id number]) Electronic Signature(s) Signed: 08/18/2022 5:59:30 PM By: [name] Catholic RN Entered By: [name] History Since Last Visit Added or deleted any medications: No Any new allergies or adverse reactions: No Had a fall or experienced change in activities of daily living that may affect risk of falls: No Signs or symptoms of abuse/neglect since last visito No Hospitalized since last visit: No Implantable  device outside of the clinic excluding cellular tissue based products placed in the center since last visit: No Pain Present Now: Yes [id number] Page 2 of 8 -------------------------------------------------------------------------------- Clinic Level of Care Assessment Details Patient Name: Date of Service: [name], [name] 08/18/2022 1:30 PM Medical Record Number: [id number] Patient Account Number: [id number] Date of Birth/Sex: Treating RN: 1955-10-07 (66 y.o. [name] Primary Care [name]: [name] Other Clinician: Referring [name]: Treating [name]/Extender: [name] in Treatment: 0 Clinic Level of Care Assessment Items TOOL 1 Quantity Score X- 1 0 Use when EandM and Procedure is performed on INITIAL visit ASSESSMENTS - Nursing Assessment / Reassessment X- 1 20 General Physical Exam (combine w/ comprehensive assessment (listed just below) when performed on new pt. evals) X- 1 25 Comprehensive Assessment (HX, ROS, Risk Assessments, Wounds Hx, etc.) ASSESSMENTS - Wound and Skin Assessment / Reassessment '[]'$  - 0 Dermatologic / Skin Assessment (not related to wound area) ASSESSMENTS - Ostomy and/or Continence Assessment and Care '[]'$  - 0 Incontinence Assessment and Management '[]'$  - 0 Ostomy Care Assessment and Management (repouching, etc.) PROCESS - Coordination of Care X - Simple Patient / Family Education for ongoing care 1 15 '[]'$  - 0 Complex (extensive) Patient / Family Education for ongoing care X- 1 10 Staff obtains Programmer, systems, Records, T Results / Process Orders est X- 1 10 Staff telephones HHA, Nursing Homes / Clarify orders / etc X- 1 10 Routine Transfer to another Facility (non-emergent condition) '[]'$  - 0 Routine Hospital Admission (non-emergent condition) '[]'$  - 0 New Admissions / Biomedical engineer / Ordering NPWT Apligraf, etc. , '[]'$  - 0 Emergency Hospital Admission (emergent  condition) PROCESS - Special Needs '[]'$  - 0 Pediatric / Minor Patient Management '[]'$  - 0 Isolation Patient Management '[]'$  - 0 Hearing / Language / Visual special needs '[]'$  - 0 Assessment of Community assistance (transportation, D/C planning, etc.) '[]'$  - 0 Additional assistance / Altered mentation '[]'$  - 0 Support Surface(s) Assessment (bed, cushion, seat, etc.) INTERVENTIONS - Miscellaneous '[]'$  - 0 External ear exam '[]'$  - 0 Patient Transfer (  multiple staff / [name] / Similar devices) '[]'$  - 0 Simple Staple / Suture removal (25 or less) '[]'$  - 0 Complex Staple / Suture removal (26 or more) '[]'$  - 0 Hypo/Hyperglycemic Management (do not check if billed separately) [name], [name] ([id number]) [id number] Page 3 of 8 '[]'$  - 0 Ankle / Brachial Index (ABI) - do not check if billed separately Has the patient been seen at the hospital within the last three years: Yes Total Score: 90 Level Of Care: New/Established - Level 3 Electronic Signature(s) Signed: 08/18/2022 5:59:30 PM By: [name] Catholic RN Entered By: [name] on 08/18/2022 13:32:20 -------------------------------------------------------------------------------- Encounter Discharge Information Details Patient Name: Date of Service: [name], [name] 08/18/2022 1:30 PM Medical Record Number: [id number] Patient Account Number: [id number] Date of Birth/Sex: Treating RN: 13-Dec-1955 (66 y.o. [name] Primary Care [name]: [name] Other Clinician: Referring [name]: Treating [name]/Extender: [name] in Treatment: 0 Encounter Discharge Information Items Discharge Condition: Stable Ambulatory Status: Wheelchair Discharge Destination: Home Transportation: Private Auto Accompanied By: self Schedule Follow-up Appointment: Yes Clinical Summary of Care: Electronic Signature(s) Signed: 08/18/2022 5:59:30 PM By: [name] Catholic RN Entered By: [name] on  08/18/2022 13:32:54 -------------------------------------------------------------------------------- Lower Extremity Assessment Details Patient Name: Date of Service: [name], [name] 08/18/2022 1:30 PM Medical Record Number: [id number] Patient Account Number: [id number] Date of Birth/Sex: Treating RN: June 11, 1956 (66 y.o. [name] Primary Care Ashyr Hedgepath: [name] Other Clinician: Referring Raine Elsass: Treating Ruhani Umland/Extender: [name] Weeks in Treatment: 0 Electronic Signature(s) Signed: 08/18/2022 5:59:30 PM By: [name] Catholic RN Entered By: [name] on 08/18/2022 13:08:00 -------------------------------------------------------------------------------- Multi Wound Chart Details Patient Name: Date of Service: [name], [name] 08/18/2022 1:30 PM Medical Record Number: [id number] Patient Account Number: [id number] Date of Birth/Sex: Treating RN: [birth year] (66 y.o. M) Primary Care Octavia Velador: [name] Other Clinician: [name] ([id number]) [id number] Page 4 of 8 Referring Colburn Asper: Treating Fernande Treiber/Extender: [name] in Treatment: 0 Vital Signs Height(in): Pulse(bpm): Weight(lbs): Blood Pressure(mmHg): Body Mass Index(BMI): Temperature(F): Respiratory Rate(breaths/min): 18 [23:Photos:] [N/A:N/A] Abdomen - midline N/A N/A Wound Location: Gradually Appeared N/A N/A Wounding Event: Lesion N/A N/A Primary Etiology: Sleep Apnea, Congestive Heart N/A N/A Comorbid History: Failure, Hypertension, Type II Diabetes, Osteoarthritis, Neuropathy 08/05/2022 N/A N/A Date Acquired: 0 N/A N/A Weeks of Treatment: Open N/A N/A Wound Status: No N/A N/A Wound Recurrence: 0.3x0.4x0.2 N/A N/A Measurements L x W x D (cm) 0.094 N/A N/A A (cm) : rea 0.019 N/A N/A Volume (cm) : Full Thickness Without Exposed N/A N/A Classification: Support Structures Medium N/A  N/A Exudate Amount: Serosanguineous N/A N/A Exudate Type: red, brown N/A N/A Exudate Color: Distinct, outline attached N/A N/A Wound Margin: Large (67-100%) N/A N/A Granulation Amount: Red, Hyper-granulation, Friable N/A N/A Granulation Quality: Small (1-33%) N/A N/A Necrotic Amount: Fat Layer (Subcutaneous Tissue): Yes N/A N/A Exposed Structures: Fascia: No Tendon: No Muscle: No Joint: No Bone: No No Abnormalities Noted N/A N/A Periwound Skin Texture: No Abnormalities Noted N/A N/A Periwound Skin Moisture: No Abnormalities Noted N/A N/A Periwound Skin Color: No Abnormality N/A N/A Temperature: Chemical Cauterization N/A N/A Procedures Performed: Treatment Notes Wound #23 (Abdomen - midline) Cleanser Soap and Water Discharge Instruction: May shower and wash wound with dial antibacterial soap and water prior to dressing change. Peri-Wound Care Topical Primary Dressing Hydrofera Blue Ready Transfer Foam, 2.5x2.5 (in/in) Discharge Instruction: Apply directly to wound bed as directed Secondary Dressing ALLEVYN Gentle Border, 4x4 (in/in) Discharge Instruction: Apply over primary dressing as directed. Secured With  Compression Fortescue, Michel ([id number]) [id number] Page 5 of 8 Compression Stockings Add-Ons Gloves, Large Electronic Signature(s) Signed: 08/18/2022 1:34:37 PM By: Fredirick Maudlin MD FACS Entered By: Fredirick Maudlin on 08/18/2022 13:34:37 -------------------------------------------------------------------------------- Multi-Disciplinary Care Plan Details Patient Name: Date of Service: [name], [name] 08/18/2022 1:30 PM Medical Record Number: [id number] Patient Account Number: [id number] Date of Birth/Sex: Treating RN: 12-Jan-1956 (66 y.o. [name] Primary Care Santana Edell: [name] Other Clinician: Referring Jarelly Rinck: Treating Luva Metzger/Extender: [name] in Treatment: 0 Active  Inactive Orientation to the Wound Care Program Nursing Diagnoses: Knowledge deficit related to the wound healing center program Goals: Patient/caregiver will verbalize understanding of the [place] Program Date Initiated: 08/18/2022 Target Resolution Date: 09/17/2022 Goal Status: Active Interventions: Provide education on orientation to the wound center Notes: Wound/Skin Impairment Nursing Diagnoses: Knowledge deficit related to smoking impact on wound healing Goals: Ulcer/skin breakdown will have a volume reduction of 30% by week 4 Date Initiated: 08/18/2022 Target Resolution Date: 09/15/2022 Goal Status: Active Interventions: Provide education on ulcer and skin care Treatment Activities: Skin care regimen initiated : 08/18/2022 Notes: Electronic Signature(s) Signed: 08/18/2022 5:59:30 PM By: [name] Catholic RN Entered By: [name] on 08/18/2022 13:29:35 [name], Dwyne ([id number]) [id number] Page 6 of 8 -------------------------------------------------------------------------------- Pain Assessment Details Patient Name: Date of Service: [name], [name] 08/18/2022 1:30 PM Medical Record Number: [id number] Patient Account Number: [id number] Date of Birth/Sex: Treating RN: July 10, 1956 (66 y.o. [name] Primary Care Piya Mesch: [name] Other Clinician: Referring Tewana Bohlen: Treating Maree Ainley/Extender: [name] in Treatment: 0 Active Problems Location of Pain Severity and Description of Pain Patient Has Paino No Site Locations Pain Management and Medication Current Pain Management: Electronic Signature(s) Signed: 08/18/2022 5:59:30 PM By: [name] Catholic RN Entered By: [name] on 08/18/2022 13:19:08 -------------------------------------------------------------------------------- Patient/Caregiver Education Details Patient Name: Date of Service: [name], [name]  11/30/2023andnbsp1:30 PM Medical Record Number: [id number] Patient Account Number: [id number] Date of Birth/Gender: Treating RN: 1956/07/27 (66 y.o. [name] Primary Care Physician: [name] Other Clinician: Referring Physician: Treating Physician/Extender: [name] in Treatment: 0 Education Assessment Education Provided To: Patient Education Topics Provided [place]: o Methods: Explain/Verbal Responses: Reinforcements needed, State content correctly Wound/Skin Impairment: Methods: Explain/Verbal Responses: Reinforcements needed, State content correctly Donnellson, [name] ([id number]) [id number] Page 7 of 8 Electronic Signature(s) Signed: 08/18/2022 5:59:30 PM By: [name] Catholic RN Entered By: [name] on 08/18/2022 13:30:14 -------------------------------------------------------------------------------- Wound Assessment Details Patient Name: Date of Service: [name], [name] 08/18/2022 1:30 PM Medical Record Number: [id number] Patient Account Number: [id number] Date of Birth/Sex: Treating RN: 1956-01-14 (66 y.o. [name] Primary Care Shanette Tamargo: [name] Other Clinician: Referring Avyukth Bontempo: Treating Bryli Mantey/Extender: [name] in Treatment: 0 Wound Status Wound Number: 23 Primary Lesion Etiology: Wound Location: Abdomen - midline Wound Open Wounding Event: Gradually Appeared Status: Date Acquired: 08/05/2022 Comorbid Sleep Apnea, Congestive Heart Failure, Hypertension, Type II Weeks Of Treatment: 0 History: Diabetes, Osteoarthritis, Neuropathy Clustered Wound: No Photos Wound Measurements Length: (cm) 0.3 Width: (cm) 0.4 Depth: (cm) 0.2 Area: (cm) 0.094 Volume: (cm) 0.019 % Reduction in Area: % Reduction in Volume: Tunneling: No Undermining: No Wound Description Classification: Full Thickness Without Exposed  Suppor Wound Margin: Distinct, outline attached Exudate Amount: Medium Exudate Type: Serosanguineous Exudate Color: red, brown t Structures Foul Odor After Cleansing: No Slough/Fibrino Yes Wound Bed Granulation Amount: Large (67-100%) Exposed Structure Granulation Quality: Red, Hyper-granulation, Friable Fascia Exposed: No Necrotic Amount: Small (1-33%)  Fat Layer (Subcutaneous Tissue) Exposed: Yes Necrotic Quality: Adherent Slough Tendon Exposed: No Muscle Exposed: No Joint Exposed: No Bone Exposed: No Periwound Skin Texture Texture Color No Abnormalities Noted: Yes No Abnormalities Noted: Yes Moisture Temperature / Pain No Abnormalities Noted: Yes Temperature: No Abnormality [name], [name] ([id number]) [id number] Page 8 of 8 Treatment Notes Wound #23 (Abdomen - midline) Cleanser Soap and Water Discharge Instruction: May shower and wash wound with dial antibacterial soap and water prior to dressing change. Peri-Wound Care Topical Primary Dressing Hydrofera Blue Ready Transfer Foam, 2.5x2.5 (in/in) Discharge Instruction: Apply directly to wound bed as directed Secondary Dressing ALLEVYN Gentle Border, 4x4 (in/in) Discharge Instruction: Apply over primary dressing as directed. Secured With Compression Wrap Compression Stockings Add-Ons Gloves, Art gallery manager) Signed: 08/18/2022 5:59:30 PM By: [name] Catholic RN Entered By: [name] on 08/18/2022 13:18:52 -------------------------------------------------------------------------------- Vitals Details Patient Name: Date of Service: [name], [name] 08/18/2022 1:30 PM Medical Record Number: [id number] Patient Account Number: [id number] Date of Birth/Sex: Treating RN: 01-Jul-1956 (66 y.o. [name] Primary Care Emberlynn Riggan: [name] Other Clinician: Referring Atiana Levier: Treating Kasondra Junod/Extender: [name] in Treatment: 0 Vital  Signs Time Taken: 13:01 Respiratory Rate (breaths/min): 18 Reference Range: 80 - 120 mg / dl Electronic Signature(s) Signed: 08/18/2022 5:59:30 PM By: [name] Catholic RN Entered By: [name] on 08/18/2022 13:01:50

## 2022-08-19 NOTE — Progress Notes (Signed)
[name], Berthold ([id number]) [id number] Page 1 of 4 Visit Report for 08/18/2022 Abuse Risk Screen Details Patient Name: Date of Service: [name], [name] 08/18/2022 1:30 PM Medical Record Number: [id number] Patient Account Number: [id number] Date of Birth/Sex: Treating RN: 1956/03/09 (66 y.o. [name] Primary Care Diasha Castleman: [name] Other Clinician: Referring Rosiland Sen: Treating Delanna Blacketer/Extender: [name] in Treatment: 0 Abuse Risk Screen Items Answer ABUSE RISK SCREEN: Has anyone close to you tried to hurt or harm you recentlyo No Do you feel uncomfortable with anyone in your familyo No Has anyone forced you do things that you didnt want to doo No Electronic Signature(s) Signed: 08/18/2022 5:59:30 PM By: [name] RN Entered By: [name] on 08/18/2022 13:04:31 -------------------------------------------------------------------------------- Activities of Daily Living Details Patient Name: Date of Service: KAELAN, AMBLE 08/18/2022 1:30 PM Medical Record Number: [id number] Patient Account Number: [id number] Date of Birth/Sex: Treating RN: Feb 03, 1956 (66 y.o. [name] Primary Care Shankar Silber: [name] Other Clinician: Referring Shakeisha Horine: Treating Calub Tarnow/Extender: [name] in Treatment: 0 Activities of Daily Living Items Answer Activities of Daily Living (Please select one for each item) Drive Automobile Not Able T Medications ake Completely Able Use T elephone Completely Able Care for Appearance Completely Able Use T oilet Completely Able Bath / Shower Completely Able Dress Self Completely Able Feed Self Completely Able Walk Completely Able Get In / Out Bed Completely Able Housework Completely Able Prepare Meals Completely [place] for Self Completely Able Electronic Signature(s) Signed: 08/18/2022 5:59:30  PM By: [name] RN Entered By: [name] on 08/18/2022 13:05:14 [name], Clayton ([id number]) [id number] Page 2 of 4 -------------------------------------------------------------------------------- Education Screening Details Patient Name: Date of Service: ATTIKUS, BARTOSZEK 08/18/2022 1:30 PM Medical Record Number: [id number] Patient Account Number: [id number] Date of Birth/Sex: Treating RN: Apr 20, 1956 (66 y.o. [name] Primary Care Adriann Ballweg: [name] Other Clinician: Referring Jhase Creppel: Treating Jaiyah Beining/Extender: [name] in Treatment: 0 Learning Preferences/Education Level/Primary Language Learning Preference: Explanation, Demonstration, Printed Material Highest Education Level: College or Above Preferred Language: English Cognitive Barrier Language Barrier: No Translator Needed: No Memory Deficit: No Emotional Barrier: No Cultural/Religious Beliefs Affecting Medical Care: No Physical Barrier Impaired Vision: No Impaired Hearing: No Decreased Hand dexterity: No Knowledge/Comprehension Knowledge Level: High Comprehension Level: High Ability to understand written instructions: High Ability to understand verbal instructions: High Motivation Anxiety Level: Calm Cooperation: Cooperative Education Importance: Acknowledges Need Interest in Health Problems: Asks Questions Perception: Coherent Willingness to Engage in Self-Management High Activities: Readiness to Engage in Self-Management High Activities: Electronic Signature(s) Signed: 08/18/2022 5:59:30 PM By: [name] RN Entered By: [name] on 08/18/2022 13:07:04 -------------------------------------------------------------------------------- Fall Risk Assessment Details Patient Name: Date of Service: [name], [place] 08/18/2022 1:30 PM Medical Record Number: [id number] Patient Account Number: [id number] Date of  Birth/Sex: Treating RN: [birth year] (66 y.o. [name] Primary Care Christle Nolting: [name] Other Clinician: Referring Onofrio Klemp: Treating Darric Plante/Extender: [name] in Treatment: 0 Fall Risk Assessment Items Have you had 2 or more falls in the last 12 monthso 0 No Have you had any fall that resulted in injury in the last 12 monthso 0 No [place], Aristotle ([id number]) [phone] [id number] Page 3 of 4 FALLS RISK SCREEN History of falling - immediate or within 3 months 0 No Secondary diagnosis (Do you have 2 or more medical diagnoseso) 0 No Ambulatory aid None/bed rest/wheelchair/nurse 0 No Crutches/cane/walker 0 No Furniture 0 No Intravenous therapy Access/Saline/Heparin [employer]  0 No Gait/Transferring Normal/ bed rest/ wheelchair 0 No Weak (short steps with or without shuffle, stooped but able to lift head while walking, may seek 0 No support from furniture) Impaired (short steps with shuffle, may have difficulty arising from chair, head down, impaired 0 No balance) Mental Status Oriented to own ability 0 No Electronic Signature(s) Signed: 08/18/2022 5:59:30 PM By: [name] RN Entered By: [name] on 08/18/2022 13:07:14 -------------------------------------------------------------------------------- Foot Assessment Details Patient Name: Date of Service: [name], Georg[place] 08/18/2022 1:30 PM Medical Record Number: [id number] Patient Account Number: [id number] Date of Birth/Sex: Treating RN: 1956-06-06 (66 y.o. [name] Primary Care Kwame Ryland: [name] Other Clinician: Referring Timiko Offutt: Treating Lindbergh Winkles/Extender: [name] in Treatment: 0 Foot Assessment Items Site Locations + = Sensation present, - = Sensation absent, C = Callus, U = Ulcer R = Redness, W = Warmth, M = Maceration, PU = Pre-ulcerative lesion F = Fissure, S = Swelling, D = Dryness Assessment Right:  Left: Other Deformity: No No Prior Foot Ulcer: No No Prior Amputation: No No Charcot Joint: No No Ambulatory Status: Ambulatory With Help Assistance Device: Wheelchair GaitLionell Matuszak, [place] ([id number]) [id number] Page 4 of 4 Electronic Signature(s) Signed: 08/18/2022 5:59:30 PM By: [name] RN Entered By: [name] on 08/18/2022 13:07:50 -------------------------------------------------------------------------------- Nutrition Risk Screening Details Patient Name: Date of Service: EMILY, FORSE 08/18/2022 1:30 PM Medical Record Number: [id number] Patient Account Number: [id number] Date of Birth/Sex: Treating RN: 21-Mar-1956 (66 y.o. [name] Primary Care Sueanne Maniaci: [name] Other Clinician: Referring Saylee Sherrill: Treating Pate Aylward/Extender: [name] in Treatment: 0 Height (in): Weight (lbs): Body Mass Index (BMI): Nutrition Risk Screening Items Score Screening NUTRITION RISK SCREEN: I have an illness or condition that made me change the kind and/or amount of food I eat 0 No I eat fewer than two meals per day 0 No I eat few fruits and vegetables, or milk products 0 No I have three or more drinks of beer, liquor or wine almost every day 0 No I have tooth or mouth problems that make it hard for me to eat 0 No I don't always have enough money to buy the food I need 0 No I eat alone most of the time 0 No I take three or more different prescribed or over-the-counter drugs a day 0 No Without wanting to, I have lost or gained 10 pounds in the last six months 0 No I am not always physically able to shop, cook and/or feed myself 0 No Nutrition Protocols Good Risk Protocol 0 No interventions needed Moderate Risk Protocol High Risk Proctocol Risk Level: Good Risk Score: 0 Electronic Signature(s) Signed: 08/18/2022 5:59:30 PM By: [name] RN Entered By: [name] on  08/18/2022 13:07:26

## 2022-08-19 NOTE — Progress Notes (Signed)
[name], Quenton ([id number]) [id number] Page 1 of 9 Visit Report for 08/18/2022 Chief Complaint Document Details Patient Name: Date of Service: [name], [name] 08/18/2022 1:30 PM Medical Record Number: [id number] Patient Account Number: [id number] Date of Birth/Sex: Treating RN: January 07, 1956 (66 y.o. M) Primary Care Provider: [name] Other Clinician: Referring Provider: Treating Provider/Extender: [name] in Treatment: 0 Information Obtained from: Patient Chief Complaint 11/09/2018; patient is here for review of wounds on his dorsal right first and second toes and on the dorsal left first toe. 02/22/2022: The patient is here for a new ulcer on his posterior left heel. 08/18/2022: here with new wound on abdomen, similar to prior Electronic Signature(s) Signed: 08/18/2022 1:35:03 PM By: Fredirick Maudlin MD FACS Entered By: Fredirick Maudlin on 08/18/2022 13:35:03 -------------------------------------------------------------------------------- HPI Details Patient Name: Date of Service: [name], [name] 08/18/2022 1:30 PM Medical Record Number: [id number] Patient Account Number: [id number] Date of Birth/Sex: Treating RN: 1956/03/14 (66 y.o. M) Primary Care Provider: [name] Other Clinician: Referring Provider: Treating Provider/Extender: [name] in Treatment: 0 History of Present Illness HPI Description: READMISSION 11/09/2018 This is a now 66 year old man that we had in this clinic over a multitude of years but most recently in 2015. He had wounds on his plantar foot initially in 2007 and 2008 and I think subsequently was seen in 2012 then 2014 into the mid part of 2015 with wounds on his toes. He is a type II diabetic with peripheral neuropathy but no known PAD. The patient states he was doing well until about a month ago he noted an area on the left great toe which was superficial and then  an area on the right great and second toes about a week later. He is not sure how this happened he simply noticed this when he was lying in bed at night. He has been using peroxide. He has not seen a doctor. He has not been on antibiotics and has had no x-rays. Past medical history; type 2 diabetes with peripheral neuropathy, hypertension, hypothyroidism, low B12 levels, iron deficiency anemia, ventral hernia, diabetic nephropathy, chronic diastolic heart failure and obstructive sleep apnea. ABIs in this clinic were 1.2 on the right and 1.1 on the left 2/28; patient arrived last week for areas on his left first right first and right second toes. X-ray of the right foot did not show any osseous abnormality. Culture of the right great toe showed Staphylococcus aureus methicillin sensitive and he is on dicloxacillin which he started 3 days ago. 3/6; patient with wounds on his right first and second toes and left first toes. The area over the left first toe is extensive we have been using silver alginate. Culture grew MSSA and he is on dicloxacillin which I prescribed on the 25th. He is still taking this which I am not really sure of the reason. He expressed knowledge that this was 4 times a day he should have been out of this around March 2 if he picked it up on the 25th or they gave him the right number of pills. Nevertheless our nurses report more purulent drainage and odor 3/17-Patient returns for the 3 wounds the left first toe and the right first and second toe dorsal surfaces. The left first toe is closed up and healed, the right first toe dorsal surface wound is extensive and appears the same as last time, no purulent drainage or odor reported per nurse, left second toe wound is slightly smaller. He has completed  the dicloxacillin as of today. Noted that his culture previously had grown MSSA. We have been using calcium alginate to the wounds. X-rays have been reviewed. ABIs were reviewed as  well. 3/27; patient returns for review of wounds on his right first and right second toes. The wound on the left first toe is healed. [name], [name] ([id number]) [id number] Page 2 of 9 4/10; 2-week follow-up. The area on the right second toe dorsally is healed. Per the patient the left first toe remains healed. He still has an extensive area dorsally over the right first toe inner phalangeal joint area. Rolled up hyper granulated tissue with probably a nonviable surface 4/24; 2-week follow-up. The areas on the right second toe dorsally and right first toe dorsally is healed. The area on the dorsal left first toe over the inner phalangeal joint is smaller but still hyper granulated we have been using Hydrofera Blue 5/15; 3-week follow-up. The areas on the right first toe. Not the left as I said on 4/24. He is using Hydrofera Blue. 5/29; 3-week follow-up. The area on his dorsal right toe over the right inter phalangeal joint is just about closed. We have been using Hydrofera Blue. In passing he showed me his right thumb which is exquisitely tender over the tip of the digit. 6/5; the area on the dorsal right toe over the inner phalangeal joint is closed. Right thumb is better which I gave him antibiotics for last week. Still some tenderness but considerably improved READMISSION 02/22/2022 This is a 66 year old type II diabetic with congestive heart failure, end-stage renal disease on hemodialysis, peripheral artery disease, and morbid obesity. He has developed an ulcer on his left posterior heel that he believes is secondary to his health assistance not adequately moisturizing his feet. He says it first appeared about 2 weeks ago. He does not have sensation in his feet. He has not noticed any foul odor or drainage. His last hemoglobin A1c was 7.9 at the beginning of January. ABI in clinic today was 1.15. 03/09/2022: He has a new wound on his abdomen that he says started out as  a lump and then the skin broke open when he bumped it on a portion of his hospital bed at home. There is fat exposed. The wound on his heel is a little bit smaller but still has a very pale surface. There is periwound callus accumulation. 03/30/2022: He had another small wound opened up on his abdomen. He says that he thinks these are starting as blisters. He has been on Mounjaro for weight loss and it looks like the loose skin resulting from losing weight might be rubbing and causing small friction injuries. The wound that I treated with chemical cauterization last visit is much smaller; the new wound has a very similar appearance to this. His heel ulcer is smaller with just a little bit of slough buildup on the surface. He continues to have fairly impressive periwound callus formation. 04/07/2022: Both abdominal wounds are smaller today and there is less hypertrophic granulation tissue protruding. The wound on his heel also continues to contract. There is just a layer of slough on the surface. No concern for infection. 04/29/2022: The previous 2 abdominal wounds have closed but he has a new 1 that appears identical to the previous lesions. The wound on his heel is smaller but has a thick layer of callus overlying it. 05/13/2022: His heel wound has healed. The wound on his abdomen remains small but still has some hypertrophic granulation tissue  present. He says it drains serous fluid from time to time. READMISSION 08/18/2022: [name] returns with reopening of the small wound on his abdomen. He said it closed and then he recently went swimming and later that day, he noted some serosanguineous drainage on his shirt. He has not had any fevers or chills. The drainage has never been purulent or foul-smelling. As per his previous admission, the wound is small and circular with fat exposed. No concern for infection. Electronic Signature(s) Signed: 08/18/2022 1:36:15 PM By: Fredirick Maudlin MD  FACS Entered By: Fredirick Maudlin on 08/18/2022 13:36:14 -------------------------------------------------------------------------------- Chemical Cauterization Details Patient Name: Date of Service: [name], [name] 08/18/2022 1:30 PM Medical Record Number: [id number] Patient Account Number: [id number] Date of Birth/Sex: Treating RN: 04/24/1956 (66 y.o. [name] Primary Care Provider: [name] Other Clinician: Referring Provider: Treating Provider/Extender: [name] in Treatment: 0 Procedure Performed for: Wound #23 Abdomen - midline Performed By: Physician Fredirick Maudlin, MD Post Procedure Diagnosis Same as Pre-procedure Notes Scribed for Dr. Celine Ahr by Blanche East, RN Electronic Signature(s) Signed: 08/18/2022 2:18:41 PM By: Fredirick Maudlin MD FACS Signed: 08/18/2022 5:59:30 PM By: [name] RN Entered By: [name] on 08/18/2022 13:24:30 [name], Benzie ([id number]) [id number] Page 3 of 9 -------------------------------------------------------------------------------- Physical Exam Details Patient Name: Date of Service: [name], [name] 08/18/2022 1:30 PM Medical Record Number: [id number] Patient Account Number: [id number] Date of Birth/Sex: Treating RN: [birth year] (66 y.o. M) Primary Care Provider: [name] Other Clinician: Referring Provider: Treating Provider/Extender: [name] in Treatment: 0 Constitutional No acute distress. Respiratory Normal work of breathing on room air.. Notes 08/18/2022: On his upper abdomen, just to the right of his umbilicus, there is a circular lesion a couple of millimeters in diameter. There is fat exposed. There is no surrounding erythema, induration, or purulent drainage. Electronic Signature(s) Signed: 08/18/2022 1:37:36 PM By: Fredirick Maudlin MD FACS Entered By: Fredirick Maudlin on 08/18/2022  13:37:35 -------------------------------------------------------------------------------- Physician Orders Details Patient Name: Date of Service: [name], [name] 08/18/2022 1:30 PM Medical Record Number: [id number] Patient Account Number: [id number] Date of Birth/Sex: Treating RN: [birth year] (66 y.o. [name] Primary Care Provider: [name] Other Clinician: Referring Provider: Treating Provider/Extender: [name] in Treatment: 0 Verbal / Phone Orders: No Diagnosis Coding ICD-10 Coding Code Description L98.499 Non-pressure chronic ulcer of skin of other sites with unspecified severity E66.2 Morbid (severe) obesity with alveolar hypoventilation I73.9 Peripheral vascular disease, unspecified F16.38 Chronic diastolic (congestive) heart failure E11.622 Type 2 diabetes mellitus with other skin ulcer Follow-up Appointments ppointment in 2 weeks. - Dr. Celine Ahr Rm 3 Return A Anesthetic Wound #23 Abdomen - midline (In clinic) Topical Lidocaine 5% applied to wound bed Bathing/ Shower/ Hygiene May shower and wash wound with soap and water. - Change dressing every other day Wound Treatment Wound #23 - Abdomen - midline Cleanser: Soap and Water 1 x Per Day/30 Days Discharge Instructions: May shower and wash wound with dial antibacterial soap and water prior to dressing change. [name], Kaylen ([id number]) [id number] Page 4 of 9 Prim Dressing: Hydrofera Blue Ready Transfer Foam, 2.5x2.5 (in/in) (DME) (Generic) 1 x Per Day/30 Days ary Discharge Instructions: Apply directly to wound bed as directed Secondary Dressing: ALLEVYN Gentle Border, 4x4 (in/in) (DME) (Dispense As Written) 1 x Per Day/30 Days Discharge Instructions: Apply over primary dressing as directed. Add-Ons: Gloves, Large 1 x Per Day/30 Days Electronic Signature(s) Signed: 08/18/2022 1:39:51 PM By: Fredirick Maudlin MD FACS Entered By: Fredirick Maudlin on  08/18/2022 13:39:50 -------------------------------------------------------------------------------- Problem List Details Patient Name: Date of Service: [name], [name] 08/18/2022 1:30 PM Medical Record Number: [id number] Patient Account Number: [id number] Date of Birth/Sex: Treating RN: 09-29-1955 (66 y.o. M) Primary Care Provider: [name] Other Clinician: Referring Provider: Treating Provider/Extender: [name] in Treatment: 0 Active Problems ICD-10 Encounter Code Description Active Date MDM Diagnosis L98.499 Non-pressure chronic ulcer of skin of other sites with unspecified severity 08/18/2022 No Yes E66.2 Morbid (severe) obesity with alveolar hypoventilation 08/18/2022 No Yes I73.9 Peripheral vascular disease, unspecified 08/18/2022 No Yes J62.83 Chronic diastolic (congestive) heart failure 08/18/2022 No Yes E11.622 Type 2 diabetes mellitus with other skin ulcer 08/18/2022 No Yes Inactive Problems Resolved Problems Electronic Signature(s) Signed: 08/18/2022 1:34:23 PM By: Fredirick Maudlin MD FACS Entered By: Fredirick Maudlin on 08/18/2022 13:34:23 [name], [name] ([id number]) [id number] Page 5 of 9 -------------------------------------------------------------------------------- Progress Note Details Patient Name: Date of Service: [name], [name] 08/18/2022 1:30 PM Medical Record Number: [id number] Patient Account Number: [id number] Date of Birth/Sex: Treating RN: 1956-01-05 (66 y.o. M) Primary Care Provider: [name] Other Clinician: Referring Provider: Treating Provider/Extender: [name] in Treatment: 0 Subjective Chief Complaint Information obtained from Patient 11/09/2018; patient is here for review of wounds on his dorsal right first and second toes and on the dorsal left first toe. 02/22/2022: The patient is here for a new ulcer on his posterior left heel. 08/18/2022: here  with new wound on abdomen, similar to prior History of Present Illness (HPI) READMISSION 11/09/2018 This is a now 66 year old man that we had in this clinic over a multitude of years but most recently in 2015. He had wounds on his plantar foot initially in 2007 and 2008 and I think subsequently was seen in 2012 then 2014 into the mid part of 2015 with wounds on his toes. He is a type II diabetic with peripheral neuropathy but no known PAD. The patient states he was doing well until about a month ago he noted an area on the left great toe which was superficial and then an area on the right great and second toes about a week later. He is not sure how this happened he simply noticed this when he was lying in bed at night. He has been using peroxide. He has not seen a doctor. He has not been on antibiotics and has had no x-rays. Past medical history; type 2 diabetes with peripheral neuropathy, hypertension, hypothyroidism, low B12 levels, iron deficiency anemia, ventral hernia, diabetic nephropathy, chronic diastolic heart failure and obstructive sleep apnea. ABIs in this clinic were 1.2 on the right and 1.1 on the left 2/28; patient arrived last week for areas on his left first right first and right second toes. X-ray of the right foot did not show any osseous abnormality. Culture of the right great toe showed Staphylococcus aureus methicillin sensitive and he is on dicloxacillin which he started 3 days ago. 3/6; patient with wounds on his right first and second toes and left first toes. The area over the left first toe is extensive we have been using silver alginate. Culture grew MSSA and he is on dicloxacillin which I prescribed on the 25th. He is still taking this which I am not really sure of the reason. He expressed knowledge that this was 4 times a day he should have been out of this around March 2 if he picked it up on the 25th or they gave him the right number of pills. Nevertheless our nurses  report more purulent drainage and odor 3/17-Patient returns for the 3 wounds the left first toe and the right first and second toe dorsal surfaces. The left first toe is closed up and healed, the right first toe dorsal surface wound is extensive and appears the same as last time, no purulent drainage or odor reported per nurse, left second toe wound is slightly smaller. He has completed the dicloxacillin as of today. Noted that his culture previously had grown MSSA. We have been using calcium alginate to the wounds. X-rays have been reviewed. ABIs were reviewed as well. 3/27; patient returns for review of wounds on his right first and right second toes. The wound on the left first toe is healed. 4/10; 2-week follow-up. The area on the right second toe dorsally is healed. Per the patient the left first toe remains healed. He still has an extensive area dorsally over the right first toe inner phalangeal joint area. Rolled up hyper granulated tissue with probably a nonviable surface 4/24; 2-week follow-up. The areas on the right second toe dorsally and right first toe dorsally is healed. The area on the dorsal left first toe over the inner phalangeal joint is smaller but still hyper granulated we have been using Hydrofera Blue 5/15; 3-week follow-up. The areas on the right first toe. Not the left as I said on 4/24. He is using Hydrofera Blue. 5/29; 3-week follow-up. The area on his dorsal right toe over the right inter phalangeal joint is just about closed. We have been using Hydrofera Blue. In passing he showed me his right thumb which is exquisitely tender over the tip of the digit. 6/5; the area on the dorsal right toe over the inner phalangeal joint is closed. Right thumb is better which I gave him antibiotics for last week. Still some tenderness but considerably improved READMISSION 02/22/2022 This is a 66 year old type II diabetic with congestive heart failure, end-stage renal disease on  hemodialysis, peripheral artery disease, and morbid obesity. He has developed an ulcer on his left posterior heel that he believes is secondary to his health assistance not adequately moisturizing his feet. He says it first appeared about 2 weeks ago. He does not have sensation in his feet. He has not noticed any foul odor or drainage. His last hemoglobin A1c was 7.9 at the beginning of January. ABI in clinic today was 1.15. 03/09/2022: He has a new wound on his abdomen that he says started out as a lump and then the skin broke open when he bumped it on a portion of his hospital bed at home. There is fat exposed. The wound on his heel is a little bit smaller but still has a very pale surface. There is periwound callus accumulation. 03/30/2022: He had another small wound opened up on his abdomen. He says that he thinks these are starting as blisters. He has been on Mounjaro for weight loss and it looks like the loose skin resulting from losing weight might be rubbing and causing small friction injuries. The wound that I treated with chemical cauterization last visit is much smaller; the new wound has a very similar appearance to this. His heel ulcer is smaller with just a little bit of slough buildup on the surface. He continues to have fairly impressive periwound callus formation. 04/07/2022: Both abdominal wounds are smaller today and there is less hypertrophic granulation tissue protruding. The wound on his heel also continues to contract. There is just a layer of slough on the surface. No concern  for infection. 04/29/2022: The previous 2 abdominal wounds have closed but he has a new 1 that appears identical to the previous lesions. The wound on his heel is smaller but has a thick layer of callus overlying it. 05/13/2022: His heel wound has healed. The wound on his abdomen remains small but still has some hypertrophic granulation tissue present. He says it drains serous fluid from time to  time. READMISSION 08/18/2022: [name] returns with reopening of the small wound on his abdomen. He said it closed and then he recently went swimming and later that day, he noted some serosanguineous drainage on his shirt. He has not had any fevers or chills. The drainage has never been purulent or foul-smelling. As per his previous admission, the wound is small and circular with fat exposed. No concern for infection. Patient History Information obtained from Patient. [name], [name] ([id number]) [id number] Page 6 of 9 Allergies Shellfish Containing Products (Severity: Severe, Reaction: anaphylaxis), lisinopril (Severity: Moderate, Reaction: Cough), lovastatin (Severity: Severe), Relistor (Severity: Severe) Family History Cancer - Mother,Father, Diabetes - Mother,Father,Siblings, Heart Disease - Siblings, Lung Disease - Mother, No family history of Hereditary Spherocytosis, Hypertension, Kidney Disease, Seizures, Stroke, Thyroid Problems, Tuberculosis. Social History Never smoker, Marital Status - Single, Alcohol Use - Never, Drug Use - No History, Caffeine Use - Daily - Coffee. Medical History Eyes Denies history of Cataracts, Glaucoma, Optic Neuritis Ear/Nose/Mouth/Throat Denies history of Chronic sinus problems/congestion, Middle ear problems Hematologic/Lymphatic Denies history of Anemia, Hemophilia, Human Immunodeficiency Virus, Lymphedema, Sickle Cell Disease Respiratory Patient has history of Sleep Apnea Denies history of Aspiration, Asthma, Chronic Obstructive Pulmonary Disease (COPD), Pneumothorax, Tuberculosis Cardiovascular Patient has history of Congestive Heart Failure, Hypertension Denies history of Angina, Arrhythmia, Coronary Artery Disease, Deep Vein Thrombosis, Hypotension, Myocardial Infarction, Peripheral Arterial Disease, Peripheral Venous Disease, Phlebitis, Vasculitis Gastrointestinal Denies history of Cirrhosis , Colitis, Crohnoos,  Hepatitis A, Hepatitis B, Hepatitis C Endocrine Patient has history of Type II Diabetes Denies history of Type I Diabetes Genitourinary Denies history of End Stage Renal Disease Immunological Denies history of Lupus Erythematosus, Raynaudoos, Scleroderma Integumentary (Skin) Denies history of History of Burn Musculoskeletal Patient has history of Osteoarthritis Denies history of Gout, Rheumatoid Arthritis, Osteomyelitis Neurologic Patient has history of Neuropathy - Diabetic Denies history of Dementia, Quadriplegia, Paraplegia, Seizure Disorder Oncologic Denies history of Received Chemotherapy, Received Radiation Psychiatric Denies history of Anorexia/bulimia, Confinement Anxiety Objective Constitutional No acute distress. Vitals Time Taken: 1:01 PM, Respiratory Rate: 18 breaths/min. Respiratory Normal work of breathing on room air.. General Notes: 08/18/2022: On his upper abdomen, just to the right of his umbilicus, there is a circular lesion a couple of millimeters in diameter. There is fat exposed. There is no surrounding erythema, induration, or purulent drainage. Integumentary (Hair, Skin) Wound #23 status is Open. Original cause of wound was Gradually Appeared. The date acquired was: 08/05/2022. The wound is located on the Abdomen - midline. The wound measures 0.3cm length x 0.4cm width x 0.2cm depth; 0.094cm^2 area and 0.019cm^3 volume. There is Fat Layer (Subcutaneous Tissue) exposed. There is no tunneling or undermining noted. There is a medium amount of serosanguineous drainage noted. The wound margin is distinct with the outline attached to the wound base. There is large (67-100%) red, friable, hyper - granulation within the wound bed. There is a small (1-33%) amount of necrotic tissue within the wound bed including Adherent Slough. The periwound skin appearance had no abnormalities noted for texture. The periwound skin appearance had no abnormalities noted for moisture.  The periwound  skin appearance had no abnormalities noted for color. Periwound temperature was noted as No Abnormality. Assessment Active Problems ICD-10 [name], [name] ([id number]) [id number] Page 7 of 9 Non-pressure chronic ulcer of skin of other sites with unspecified severity Morbid (severe) obesity with alveolar hypoventilation Peripheral vascular disease, unspecified Chronic diastolic (congestive) heart failure Type 2 diabetes mellitus with other skin ulcer Procedures Wound #23 Pre-procedure diagnosis of Wound #23 is a Lesion located on the Abdomen - midline . An Chemical Cauterization procedure was performed by Fredirick Maudlin, MD. Post procedure Diagnosis Wound #23: Same as Pre-Procedure Notes: Scribed for Dr. Celine Ahr by Blanche East, RN Plan Follow-up Appointments: Return Appointment in 2 weeks. - Dr. Celine Ahr Rm 3 Anesthetic: Wound #23 Abdomen - midline: (In clinic) Topical Lidocaine 5% applied to wound bed Bathing/ Shower/ Hygiene: May shower and wash wound with soap and water. - Change dressing every other day WOUND #23: - Abdomen - midline Wound Laterality: Cleanser: Soap and Water 1 x Per Day/30 Days Discharge Instructions: May shower and wash wound with dial antibacterial soap and water prior to dressing change. Prim Dressing: Hydrofera Blue Ready Transfer Foam, 2.5x2.5 (in/in) (DME) (Generic) 1 x Per Day/30 Days ary Discharge Instructions: Apply directly to wound bed as directed Secondary Dressing: ALLEVYN Gentle Border, 4x4 (in/in) (DME) (Dispense As Written) 1 x Per Day/30 Days Discharge Instructions: Apply over primary dressing as directed. Add-Ons: Gloves, Large 1 x Per Day/30 Days 08/18/2022: On his upper abdomen, just to the right of his umbilicus, there is a circular lesion a couple of millimeters in diameter. There is fat exposed. There is no surrounding erythema, induration, or purulent drainage. I used silver nitrate to chemically  cauterize the protruding tissue from the wound. He responded well during his last admission to [facility] and a foam border dressing. We will utilize this treatment again. Follow-up in 2 weeks. Electronic Signature(s) Signed: 08/18/2022 1:41:50 PM By: Fredirick Maudlin MD FACS Entered By: Fredirick Maudlin on 08/18/2022 13:41:50 -------------------------------------------------------------------------------- HxROS Details Patient Name: Date of Service: [name], [place] 08/18/2022 1:30 PM Medical Record Number: [id number] Patient Account Number: [id number] Date of Birth/Sex: Treating RN: 17-Aug-1956 (66 y.o. [name] Primary Care Provider: [name] Other Clinician: Referring Provider: Treating Provider/Extender: [name] in Treatment: 0 Information Obtained From Patient Eyes Medical History: Negative for: Cataracts; Glaucoma; Optic Neuritis [name], CORRALES ([id number]) [id number] Page 8 of 9 Ear/Nose/Mouth/Throat Medical History: Negative for: Chronic sinus problems/congestion; Middle ear problems Hematologic/Lymphatic Medical History: Negative for: Anemia; Hemophilia; Human Immunodeficiency Virus; Lymphedema; Sickle Cell Disease Respiratory Medical History: Positive for: Sleep Apnea Negative for: Aspiration; Asthma; Chronic Obstructive Pulmonary Disease (COPD); Pneumothorax; Tuberculosis Cardiovascular Medical History: Positive for: Congestive Heart Failure; Hypertension Negative for: Angina; Arrhythmia; Coronary Artery Disease; Deep Vein Thrombosis; Hypotension; Myocardial Infarction; Peripheral Arterial Disease; Peripheral Venous Disease; Phlebitis; Vasculitis Gastrointestinal Medical History: Negative for: Cirrhosis ; Colitis; Crohns; Hepatitis A; Hepatitis B; Hepatitis C Endocrine Medical History: Positive for: Type II Diabetes Negative for: Type I Diabetes Time with diabetes: 1994 Treated with:  Insulin Blood sugar tested every day: No Genitourinary Medical History: Negative for: End Stage Renal Disease Immunological Medical History: Negative for: Lupus Erythematosus; Raynauds; Scleroderma Integumentary (Skin) Medical History: Negative for: History of Burn Musculoskeletal Medical History: Positive for: Osteoarthritis Negative for: Gout; Rheumatoid Arthritis; Osteomyelitis Neurologic Medical History: Positive for: Neuropathy - Diabetic Negative for: Dementia; Quadriplegia; Paraplegia; Seizure Disorder Oncologic Medical History: Negative for: Received Chemotherapy; Received Radiation Psychiatric Medical History: Negative for: Anorexia/bulimia; Confinement Anxiety Immunizations Pneumococcal Vaccine: Received  Pneumococcal Vaccination: No JOSSIAH, SMOAK ([id number]) [id number] Page 9 of 9 Implantable Devices No devices added Family and Social History Cancer: Yes - Mother,Father; Diabetes: Yes - Mother,Father,Siblings; Heart Disease: Yes - Siblings; Hereditary Spherocytosis: No; Hypertension: No; Kidney Disease: No; Lung Disease: Yes - Mother; Seizures: No; Stroke: No; Thyroid Problems: No; Tuberculosis: No; Never smoker; Marital Status - Single; Alcohol Use: Never; Drug Use: No History; Caffeine Use: Daily - Coffee; Financial Concerns: No; Food, Clothing or Shelter Needs: No; Support System Lacking: No; Transportation Concerns: No Electronic Signature(s) Signed: 08/18/2022 2:18:41 PM By: Fredirick Maudlin MD FACS Signed: 08/18/2022 5:59:30 PM By: [name] RN Entered By: [name] on 08/18/2022 13:04:23 -------------------------------------------------------------------------------- SuperBill Details Patient Name: Date of Service: [name], [place] 08/18/2022 Medical Record Number: [id number] Patient Account Number: [id number] Date of Birth/Sex: Treating RN: 1955/12/09 (66 y.o. 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 Morbid (severe) obesity with alveolar hypoventilation I73.9 Peripheral vascular disease, unspecified O37.85 Chronic diastolic (congestive) heart failure E11.622 Type 2 diabetes mellitus with other skin ulcer Facility Procedures : CPT4 Code: [id number] Description: 99213 - WOUND CARE VISIT-LEV 3 EST PT Modifier: 25 Quantity: 1 Physician Procedures : CPT4 Code Description Modifier [id number] - WC PHYS LEVEL 4 - EST PT 25 ICD-10 Diagnosis Description L98.499 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

## 2022-09-01 ENCOUNTER — Encounter (HOSPITAL_BASED_OUTPATIENT_CLINIC_OR_DEPARTMENT_OTHER): Payer: Medicare Other | Attending: Internal Medicine | Admitting: Internal Medicine

## 2022-09-01 DIAGNOSIS — I132 Hypertensive heart and chronic kidney disease with heart failure and with stage 5 chronic kidney disease, or end stage renal disease: Secondary | ICD-10-CM | POA: Insufficient documentation

## 2022-09-01 DIAGNOSIS — L98499 Non-pressure chronic ulcer of skin of other sites with unspecified severity: Secondary | ICD-10-CM | POA: Diagnosis present

## 2022-09-01 DIAGNOSIS — G4730 Sleep apnea, unspecified: Secondary | ICD-10-CM | POA: Insufficient documentation

## 2022-09-01 DIAGNOSIS — Z6841 Body Mass Index (BMI) 40.0 and over, adult: Secondary | ICD-10-CM | POA: Diagnosis not present

## 2022-09-01 DIAGNOSIS — E662 Morbid (severe) obesity with alveolar hypoventilation: Secondary | ICD-10-CM | POA: Diagnosis not present

## 2022-09-01 DIAGNOSIS — M1990 Unspecified osteoarthritis, unspecified site: Secondary | ICD-10-CM | POA: Insufficient documentation

## 2022-09-01 DIAGNOSIS — E1142 Type 2 diabetes mellitus with diabetic polyneuropathy: Secondary | ICD-10-CM | POA: Diagnosis not present

## 2022-09-01 DIAGNOSIS — E11622 Type 2 diabetes mellitus with other skin ulcer: Secondary | ICD-10-CM | POA: Insufficient documentation

## 2022-09-01 DIAGNOSIS — I5032 Chronic diastolic (congestive) heart failure: Secondary | ICD-10-CM | POA: Diagnosis not present

## 2022-09-01 DIAGNOSIS — E1151 Type 2 diabetes mellitus with diabetic peripheral angiopathy without gangrene: Secondary | ICD-10-CM | POA: Insufficient documentation

## 2022-09-01 DIAGNOSIS — I739 Peripheral vascular disease, unspecified: Secondary | ICD-10-CM | POA: Diagnosis not present

## 2022-09-01 NOTE — Progress Notes (Addendum)
[name], [name] ([id number]) [id number] Page 1 of 6 Visit Report for 09/01/2022 HPI Details Patient Name: Date of Service: [name], [name] 09/01/2022 9:15 A M Medical Record Number: [id number] Patient Account Number: [id number] Date of Birth/Sex: Treating RN: [birth year] (66 y.o. M) Primary Care Provider: [name] Other Clinician: Referring Provider: Treating Provider/Extender: Arlis Porta in Treatment: 2 History of Present Illness HPI Description: READMISSION 11/09/2018 This is a now 66 year old man that we had in this clinic over a multitude of years but most recently in 2015. He had wounds on his plantar foot initially in 2007 and 2008 and I think subsequently was seen in 2012 then 2014 into the mid part of 2015 with wounds on his toes. He is a type II diabetic with peripheral neuropathy but no known PAD. The patient states he was doing well until about a month ago he noted an area on the left great toe which was superficial and then an area on the right great and second toes about a week later. He is not sure how this happened he simply noticed this when he was lying in bed at night. He has been using peroxide. He has not seen a doctor. He has not been on antibiotics and has had no x-rays. Past medical history; type 2 diabetes with peripheral neuropathy, hypertension, hypothyroidism, low B12 levels, iron deficiency anemia, ventral hernia, diabetic nephropathy, chronic diastolic heart failure and obstructive sleep apnea. ABIs in this clinic were 1.2 on the right and 1.1 on the left 2/28; patient arrived last week for areas on his left first right first and right second toes. X-ray of the right foot did not show any osseous abnormality. Culture of the right great toe showed Staphylococcus aureus methicillin sensitive and he is on dicloxacillin which he started 3 days ago. 3/6; patient with wounds on his right first and second toes and  left first toes. The area over the left first toe is extensive we have been using silver alginate. Culture grew MSSA and he is on dicloxacillin which I prescribed on the 25th. He is still taking this which I am not really sure of the reason. He expressed knowledge that this was 4 times a day he should have been out of this around March 2 if he picked it up on the 25th or they gave him the right number of pills. Nevertheless our nurses report more purulent drainage and odor 3/17-Patient returns for the 3 wounds the left first toe and the right first and second toe dorsal surfaces. The left first toe is closed up and healed, the right first toe dorsal surface wound is extensive and appears the same as last time, no purulent drainage or odor reported per nurse, left second toe wound is slightly smaller. He has completed the dicloxacillin as of today. Noted that his culture previously had grown MSSA. We have been using calcium alginate to the wounds. X-rays have been reviewed. ABIs were reviewed as well. 3/27; patient returns for review of wounds on his right first and right second toes. The wound on the left first toe is healed. 4/10; 2-week follow-up. The area on the right second toe dorsally is healed. Per the patient the left first toe remains healed. He still has an extensive area dorsally over the right first toe inner phalangeal joint area. Rolled up hyper granulated tissue with probably a nonviable surface 4/24; 2-week follow-up. The areas on the right second toe dorsally and right first toe dorsally is healed. The  area on the dorsal left first toe over the inner phalangeal joint is smaller but still hyper granulated we have been using Hydrofera Blue 5/15; 3-week follow-up. The areas on the right first toe. Not the left as I said on 4/24. He is using Hydrofera Blue. 5/29; 3-week follow-up. The area on his dorsal right toe over the right inter phalangeal joint is just about closed. We have been  using Hydrofera Blue. In passing he showed me his right thumb which is exquisitely tender over the tip of the digit. 6/5; the area on the dorsal right toe over the inner phalangeal joint is closed. Right thumb is better which I gave him antibiotics for last week. Still some tenderness but considerably improved READMISSION 02/22/2022 This is a 66 year old type II diabetic with congestive heart failure, end-stage renal disease on hemodialysis, peripheral artery disease, and morbid obesity. He has developed an ulcer on his left posterior heel that he believes is secondary to his health assistance not adequately moisturizing his feet. He says it first appeared about 2 weeks ago. He does not have sensation in his feet. He has not noticed any foul odor or drainage. His last hemoglobin A1c was 7.9 at the beginning of January. ABI in clinic today was 1.15. 03/09/2022: He has a new wound on his abdomen that he says started out as a lump and then the skin broke open when he bumped it on a portion of his hospital bed at home. There is fat exposed. The wound on his heel is a little bit smaller but still has a very pale surface. There is periwound callus accumulation. 03/30/2022: He had another small wound opened up on his abdomen. He says that he thinks these are starting as blisters. He has been on Mounjaro for weight loss and it looks like the loose skin resulting from losing weight might be rubbing and causing small friction injuries. The wound that I treated with chemical cauterization last visit is much smaller; the new wound has a very similar appearance to this. His heel ulcer is smaller with just a little bit of slough buildup on the surface. He continues to have fairly impressive periwound callus formation. 04/07/2022: Both abdominal wounds are smaller today and there is less hypertrophic granulation tissue protruding. The wound on his heel also continues to contract. There is just a layer of slough on the  surface. No concern for infection. 04/29/2022: The previous 2 abdominal wounds have closed but he has a new 1 that appears identical to the previous lesions. The wound on his heel is smaller but has a thick layer of callus overlying it. 05/13/2022: His heel wound has healed. The wound on his abdomen remains small but still has some hypertrophic granulation tissue present. He says it drains serous fluid from time to time. READMISSION 08/18/2022: [name] returns with reopening of the small wound on his abdomen. He said it closed and then he recently went swimming and later that day, he noted some serosanguineous drainage on his shirt. He has not had any fevers or chills. The drainage has never been purulent or foul-smelling. As per his ELION, HOCKER ([id number]) [id number] Page 2 of 6 previous admission, the wound is small and circular with fat exposed. No concern for infection. 12/14; the patient's original wound that he came in with has healed and is epithelialized however just below this he has another open area. He noticed this 3 days ago with a significant amount of sanguinous drainage. No pain no obvious  purulence. Electronic Signature(s) Signed: 09/01/2022 4:13:03 PM By: Linton Ham MD Entered By: Linton Ham on 09/01/2022 09:46:28 -------------------------------------------------------------------------------- Physical Exam Details Patient Name: Date of Service: [name], [name] 09/01/2022 9:15 A M Medical Record Number: [id number] Patient Account Number: [id number] Date of Birth/Sex: Treating RN: 1956/05/21 (66 y.o. M) Primary Care Provider: [name] Other Clinician: Referring Provider: Treating Provider/Extender: Arlis Porta in Treatment: 2 Constitutional Temperature is normal and within the target range for the patient.. Gastrointestinal (GI) Abdomen is soft and non-distended without masses or  tenderness.. Notes . Wound exam; upper abdomen his original wound seems epithelialized. However just below this and towards the midline he has a small open area this measures 5.5 cm in depth there is no surrounding tenderness no purulence. His abdomen is obese but nontender. There is a large vertical surgical scar in the midline here which she says was a hernia with underlying mesh although I have not taken time to verify this. Electronic Signature(s) Signed: 09/01/2022 4:13:03 PM By: Linton Ham MD Entered By: Linton Ham on 09/01/2022 09:48:58 -------------------------------------------------------------------------------- Physician Orders Details Patient Name: Date of Service: [name], [name] 09/01/2022 9:15 A M Medical Record Number: [id number] Patient Account Number: [id number] Date of Birth/Sex: Treating RN: May 21, 1956 (66 y.o. [name] Primary Care Provider: [name] Other Clinician: Referring Provider: Treating Provider/Extender: Arlis Porta in Treatment: 2 Verbal / Phone Orders: No Diagnosis Coding Follow-up Appointments ppointment in 2 weeks. - Dr. Celine Ahr Rm 2 Return A Bathing/ Shower/ Hygiene May shower and wash wound with soap and water. - Change dressing every other day [place] to [place] for skilled nursing wound care. May utilize formulary equivalent dressing for wound treatment orders unless otherwise specified. Dressing changes to be completed by [place] on Monday / Wednesday / Friday except when patient has scheduled visit at [facility]. Other Home Health Orders/Instructions: - [facility] POLK, MINOR ([id number]) [id number] Page 3 of 6 Wound #24 - Abdomen - midline Wound Laterality: Distal Cleanser: Soap and Water 1 x Per Day/30 Days Discharge Instructions: May shower and wash wound with dial antibacterial soap and water prior to dressing  change. Prim Dressing: Iodoform packing strip 1/4 (in) 1 x Per Day/30 Days ary Discharge Instructions: Lightly pack as instructed Secondary Dressing: Zetuvit Plus Silicone Border Dressing 4x4 (in/in) 1 x Per Day/30 Days Discharge Instructions: Apply silicone border over primary dressing as directed. Add-Ons: Gloves, Large 1 x Per Day/30 Days Laboratory naerobe culture (MICRO) - culture of nonhealing to wound to abdomen Bacteria identified in Unspecified specimen by A LOINC Code: 950-9 Convenience Name: Anaerobic culture Electronic Signature(s) Signed: 09/02/2022 2:46:37 PM By: Linton Ham MD Signed: 09/02/2022 4:42:15 PM By: Adline Peals Previous Signature: 09/01/2022 4:13:03 PM Version By: Linton Ham MD Previous Signature: 09/01/2022 4:17:37 PM Version By: Adline Peals Entered By: Adline Peals on 09/02/2022 09:26:43 -------------------------------------------------------------------------------- Problem List Details Patient Name: Date of Service: [name], [name] 09/01/2022 9:15 A M Medical Record Number: [id number] Patient Account Number: [id number] Date of Birth/Sex: Treating RN: 1956-06-25 (66 y.o. M) Primary Care Provider: [name] Other Clinician: Referring Provider: Treating Provider/Extender: Arlis Porta in Treatment: 2 Active Problems ICD-10 Encounter Code Description Active Date MDM Diagnosis L98.499 Non-pressure chronic ulcer of skin of other sites with unspecified severity 08/18/2022 No Yes E66.2 Morbid (severe) obesity with alveolar hypoventilation 08/18/2022 No Yes I73.9 Peripheral vascular disease, unspecified 08/18/2022 No Yes K99.83 Chronic diastolic (congestive) heart failure 08/18/2022 No Yes E11.622  Type 2 diabetes mellitus with other skin ulcer 08/18/2022 No Yes Inactive Problems Resolved Problems [name], [name] ([id number]) [id number] Page 4 of 6 Electronic  Signature(s) Signed: 09/01/2022 4:13:03 PM By: Linton Ham MD Entered By: Linton Ham on 09/01/2022 09:41:43 -------------------------------------------------------------------------------- Progress Note Details Patient Name: Date of Service: [name], [name] 09/01/2022 9:15 A M Medical Record Number: [id number] Patient Account Number: [id number] Date of Birth/Sex: Treating RN: August 24, 1956 (66 y.o. M) Primary Care Provider: [name] Other Clinician: Referring Provider: Treating Provider/Extender: Arlis Porta in Treatment: 2 Subjective History of Present Illness (HPI) READMISSION 11/09/2018 This is a now 66 year old man that we had in this clinic over a multitude of years but most recently in 2015. He had wounds on his plantar foot initially in 2007 and 2008 and I think subsequently was seen in 2012 then 2014 into the mid part of 2015 with wounds on his toes. He is a type II diabetic with peripheral neuropathy but no known PAD. The patient states he was doing well until about a month ago he noted an area on the left great toe which was superficial and then an area on the right great and second toes about a week later. He is not sure how this happened he simply noticed this when he was lying in bed at night. He has been using peroxide. He has not seen a doctor. He has not been on antibiotics and has had no x-rays. Past medical history; type 2 diabetes with peripheral neuropathy, hypertension, hypothyroidism, low B12 levels, iron deficiency anemia, ventral hernia, diabetic nephropathy, chronic diastolic heart failure and obstructive sleep apnea. ABIs in this clinic were 1.2 on the right and 1.1 on the left 2/28; patient arrived last week for areas on his left first right first and right second toes. X-ray of the right foot did not show any osseous abnormality. Culture of the right great toe showed Staphylococcus aureus methicillin sensitive and he is on  dicloxacillin which he started 3 days ago. 3/6; patient with wounds on his right first and second toes and left first toes. The area over the left first toe is extensive we have been using silver alginate. Culture grew MSSA and he is on dicloxacillin which I prescribed on the 25th. He is still taking this which I am not really sure of the reason. He expressed knowledge that this was 4 times a day he should have been out of this around March 2 if he picked it up on the 25th or they gave him the right number of pills. Nevertheless our nurses report more purulent drainage and odor 3/17-Patient returns for the 3 wounds the left first toe and the right first and second toe dorsal surfaces. The left first toe is closed up and healed, the right first toe dorsal surface wound is extensive and appears the same as last time, no purulent drainage or odor reported per nurse, left second toe wound is slightly smaller. He has completed the dicloxacillin as of today. Noted that his culture previously had grown MSSA. We have been using calcium alginate to the wounds. X-rays have been reviewed. ABIs were reviewed as well. 3/27; patient returns for review of wounds on his right first and right second toes. The wound on the left first toe is healed. 4/10; 2-week follow-up. The area on the right second toe dorsally is healed. Per the patient the left first toe remains healed. He still has an extensive area dorsally over the right first toe inner phalangeal  joint area. Rolled up hyper granulated tissue with probably a nonviable surface 4/24; 2-week follow-up. The areas on the right second toe dorsally and right first toe dorsally is healed. The area on the dorsal left first toe over the inner phalangeal joint is smaller but still hyper granulated we have been using Hydrofera Blue 5/15; 3-week follow-up. The areas on the right first toe. Not the left as I said on 4/24. He is using Hydrofera Blue. 5/29; 3-week follow-up.  The area on his dorsal right toe over the right inter phalangeal joint is just about closed. We have been using Hydrofera Blue. In passing he showed me his right thumb which is exquisitely tender over the tip of the digit. 6/5; the area on the dorsal right toe over the inner phalangeal joint is closed. Right thumb is better which I gave him antibiotics for last week. Still some tenderness but considerably improved READMISSION 02/22/2022 This is a 66 year old type II diabetic with congestive heart failure, end-stage renal disease on hemodialysis, peripheral artery disease, and morbid obesity. He has developed an ulcer on his left posterior heel that he believes is secondary to his health assistance not adequately moisturizing his feet. He says it first appeared about 2 weeks ago. He does not have sensation in his feet. He has not noticed any foul odor or drainage. His last hemoglobin A1c was 7.9 at the beginning of January. ABI in clinic today was 1.15. 03/09/2022: He has a new wound on his abdomen that he says started out as a lump and then the skin broke open when he bumped it on a portion of his hospital bed at home. There is fat exposed. The wound on his heel is a little bit smaller but still has a very pale surface. There is periwound callus accumulation. 03/30/2022: He had another small wound opened up on his abdomen. He says that he thinks these are starting as blisters. He has been on Mounjaro for weight loss and it looks like the loose skin resulting from losing weight might be rubbing and causing small friction injuries. The wound that I treated with chemical cauterization last visit is much smaller; the new wound has a very similar appearance to this. His heel ulcer is smaller with just a little bit of slough buildup on the surface. He continues to have fairly impressive periwound callus formation. 04/07/2022: Both abdominal wounds are smaller today and there is less hypertrophic granulation  tissue protruding. The wound on his heel also continues to contract. There is just a layer of slough on the surface. No concern for infection. 04/29/2022: The previous 2 abdominal wounds have closed but he has a new 1 that appears identical to the previous lesions. The wound on his heel is smaller but has a thick layer of callus overlying it. 05/13/2022: His heel wound has healed. The wound on his abdomen remains small but still has some hypertrophic granulation tissue present. He says it drains serous fluid from time to time. [name], [name] ([id number]) [id number] Page 5 of 6 READMISSION 08/18/2022: [name] returns with reopening of the small wound on his abdomen. He said it closed and then he recently went swimming and later that day, he noted some serosanguineous drainage on his shirt. He has not had any fevers or chills. The drainage has never been purulent or foul-smelling. As per his previous admission, the wound is small and circular with fat exposed. No concern for infection. 12/14; the patient's original wound that he came in with  has healed and is epithelialized however just below this he has another open area. He noticed this 3 days ago with a significant amount of sanguinous drainage. No pain no obvious purulence. Objective Constitutional Temperature is normal and within the target range for the patient.. Vitals Time Taken: 9:27 AM, Temperature: 98.9 F, Pulse: 67 bpm, Respiratory Rate: 16 breaths/min, Blood Pressure: 113/71 mmHg. Gastrointestinal (GI) Abdomen is soft and non-distended without masses or tenderness.. General Notes: . Wound exam; upper abdomen his original wound seems epithelialized. However just below this and towards the midline he has a small open area this measures 5.5 cm in depth there is no surrounding tenderness no purulence. His abdomen is obese but nontender. There is a large vertical surgical scar in the midline here which she  says was a hernia with underlying mesh although I have not taken time to verify this. Integumentary (Hair, Skin) Wound #23 status is Open. Original cause of wound was Gradually Appeared. The date acquired was: 08/05/2022. The wound has been in treatment 2 weeks. The wound is located on the Abdomen - midline. The wound measures 0cm length x 0cm width x 0cm depth; 0cm^2 area and 0cm^3 volume. There is no tunneling or undermining noted. There is a none present amount of drainage noted. The wound margin is flat and intact. There is no granulation within the wound bed. There is no necrotic tissue within the wound bed. The periwound skin appearance had no abnormalities noted for texture. The periwound skin appearance had no abnormalities noted for moisture. The periwound skin appearance had no abnormalities noted for color. Periwound temperature was noted as No Abnormality. Wound #24 status is Open. Original cause of wound was Gradually Appeared. The date acquired was: 08/29/2022. The wound is located on the Distal Abdomen - midline. The wound measures 0.2cm length x 0.2cm width x 5.4cm depth; 0.031cm^2 area and 0.17cm^3 volume. There is Fat Layer (Subcutaneous Tissue) exposed. There is no tunneling or undermining noted. There is a medium amount of serosanguineous drainage noted. The wound margin is distinct with the outline attached to the wound base. There is large (67-100%) red granulation within the wound bed. There is a small (1-33%) amount of necrotic tissue within the wound bed. The periwound skin appearance had no abnormalities noted for texture. The periwound skin appearance had no abnormalities noted for moisture. The periwound skin appearance had no abnormalities noted for color. Periwound temperature was noted as No Abnormality. Assessment Active Problems ICD-10 Non-pressure chronic ulcer of skin of other sites with unspecified severity Morbid (severe) obesity with alveolar  hypoventilation Peripheral vascular disease, unspecified Chronic diastolic (congestive) heart failure Type 2 diabetes mellitus with other skin ulcer Plan Follow-up Appointments: Return Appointment in 2 weeks. - Dr. Celine Ahr Rm 2 Bathing/ Shower/ Hygiene: May shower and wash wound with soap and water. - Change dressing every other day Home Health: Admit to [place] for skilled nursing wound care. May utilize formulary equivalent dressing for wound treatment orders unless otherwise specified. Dressing changes to be completed by [place] on Monday / Wednesday / Friday except when patient has scheduled visit at [facility]. Other Home Health Orders/Instructions: - [id number] Laboratory ordered were: Anaerobic culture - culture of nonhealing to wound to abdomen WOUND #24: - Abdomen - midline Wound Laterality: Distal Cleanser: Soap and Water 1 x Per Day/30 Days Discharge Instructions: May shower and wash wound with dial antibacterial soap and water prior to dressing change. Prim Dressing: Iodoform packing strip 1/4 (in) 1 x Per  Day/30 Days ary Discharge Instructions: Lightly pack as instructed Secondary Dressing: Zetuvit Plus Silicone Border Dressing 4x4 (in/in) 1 x Per Day/30 Days Discharge Instructions: Apply silicone border over primary dressing as directed. Add-Ons: Gloves, Large 1 x Per Day/30 Days [name], [name] ([id number]) [id number] Page 6 of 6 1. Recurrent open wound in the abdomen. He has had this problem before. The areas seem to heal but I know that we have a clear etiology. There is no obvious dermatologic issue. This area has considerable depth and 1 would have to wonder about an infectious etiology perhaps connected with a soft tissue or perhaps mesh infection. I have cultured this wound but have not given him antibiotic therapy 2. We could not maintain silver alginate packing strips therefore we used iodoform I think the surface area of the  wound is too small for Hydrofera Blue packing. The patient lives alone he is going to have to do this himself. Electronic Signature(s) Signed: 09/02/2022 2:46:37 PM By: Linton Ham MD Signed: 09/02/2022 4:42:15 PM By: Adline Peals Previous Signature: 09/01/2022 4:13:03 PM Version By: Linton Ham MD Previous Signature: 09/01/2022 4:17:37 PM Version By: Adline Peals Entered By: Adline Peals on 09/02/2022 09:26:55 -------------------------------------------------------------------------------- SuperBill Details Patient Name: Date of Service: [name], Ericberto 09/01/2022 Medical Record Number: [id number] Patient Account Number: [id number] Date of Birth/Sex: Treating RN: October 02, 1955 (66 y.o. M) Primary Care Provider: [name] Other Clinician: Referring Provider: Treating Provider/Extender: Arlis Porta in Treatment: 2 Diagnosis Coding ICD-10 Codes Code Description [phone] Non-pressure chronic ulcer of skin of other sites with unspecified severity E66.2 Morbid (severe) obesity with alveolar hypoventilation I73.9 Peripheral vascular disease, unspecified M76.80 Chronic diastolic (congestive) heart failure E11.622 Type 2 diabetes mellitus with other skin ulcer Facility Procedures : CPT4 Code: 88110315 Description: 99213 - WOUND CARE VISIT-LEV 3 EST PT Modifier: Quantity: 1 Physician Procedures : CPT4 Code Description Modifier [id number] [id number] - WC PHYS LEVEL 3 - EST PT ICD-10 Diagnosis Description 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

## 2022-09-01 NOTE — Progress Notes (Signed)
[name], [name] ([id number]) [id number] Page 1 of 9 Visit Report for 09/01/2022 Arrival Information Details Patient Name: Date of Service: [name], [name] 09/01/2022 9:15 A M Medical Record Number: [id number] Patient Account Number: [id number] Date of Birth/Sex: Treating RN: 08/02/1956 (66 y.o. [name] Primary Care [name]: [name] Other Clinician: Referring [name]: Treating [name]/Extender: [name] in Treatment: 2 Visit Information History Since Last Visit Added or deleted any medications: No Patient Arrived: Wheel Chair Any new allergies or adverse reactions: No Arrival Time: 09:21 Had a fall or experienced change in No Accompanied By: self activities of daily living that may affect Transfer Assistance: None risk of falls: Patient Identification Verified: Yes Signs or symptoms of abuse/neglect since last visito No Secondary Verification Process Completed: Yes Hospitalized since last visit: No Implantable device outside of the clinic excluding No cellular tissue based products placed in the center since last visit: Has Dressing in Place as Prescribed: Yes Pain Present Now: Yes Electronic Signature(s) Signed: 09/01/2022 4:17:37 PM By: Adline Peals Entered By: Adline Peals on 09/01/2022 09:22:06 -------------------------------------------------------------------------------- Clinic Level of Care Assessment Details Patient Name: Date of Service: [name], [name] 09/01/2022 9:15 A M Medical Record Number: [id number] Patient Account Number: [id number] Date of Birth/Sex: Treating RN: [birth year] (66 y.o. [name] Primary Care [name]: [name] Other Clinician: Referring [name]: Treating [name]/Extender: [name] in Treatment: 2 Clinic Level of Care Assessment Items TOOL 4 Quantity Score X- 1 0 Use when only an EandM is performed on  FOLLOW-UP visit ASSESSMENTS - Nursing Assessment / Reassessment X- 1 10 Reassessment of Co-morbidities (includes updates in patient status) X- 1 5 Reassessment of Adherence to Treatment Plan ASSESSMENTS - Wound and Skin A ssessment / Reassessment X - Simple Wound Assessment / Reassessment - one wound 1 5 '[]'$  - 0 Complex Wound Assessment / Reassessment - multiple wounds '[]'$  - 0 Dermatologic / Skin Assessment (not related to wound area) ASSESSMENTS - Focused Assessment '[]'$  - 0 Circumferential Edema Measurements - multi extremities '[]'$  - 0 Nutritional Assessment / Counseling / Intervention [name], [name] ([id number]) [id number] Page 2 of 9 '[]'$  - 0 Lower Extremity Assessment (monofilament, tuning fork, pulses) '[]'$  - 0 Peripheral Arterial Disease Assessment (using hand held doppler) ASSESSMENTS - Ostomy and/or Continence Assessment and Care '[]'$  - 0 Incontinence Assessment and Management '[]'$  - 0 Ostomy Care Assessment and Management (repouching, etc.) PROCESS - Coordination of Care X - Simple Patient / Family Education for ongoing care 1 15 '[]'$  - 0 Complex (extensive) Patient / Family Education for ongoing care X- 1 10 Staff obtains Programmer, systems, Records, T Results / Process Orders est '[]'$  - 0 Staff telephones HHA, Nursing Homes / Clarify orders / etc '[]'$  - 0 Routine Transfer to another Facility (non-emergent condition) '[]'$  - 0 Routine Hospital Admission (non-emergent condition) '[]'$  - 0 New Admissions / Biomedical engineer / Ordering NPWT Apligraf, etc. , '[]'$  - 0 Emergency Hospital Admission (emergent condition) X- 1 10 Simple Discharge Coordination '[]'$  - 0 Complex (extensive) Discharge Coordination PROCESS - Special Needs '[]'$  - 0 Pediatric / Minor Patient Management '[]'$  - 0 Isolation Patient Management '[]'$  - 0 Hearing / Language / Visual special needs '[]'$  - 0 Assessment of Community assistance (transportation, D/C planning, etc.) '[]'$  - 0 Additional  assistance / Altered mentation '[]'$  - 0 Support Surface(s) Assessment (bed, cushion, seat, etc.) INTERVENTIONS - Wound Cleansing / Measurement X - Simple Wound Cleansing - one wound 1 5 '[]'$  - 0 Complex Wound Cleansing -  multiple wounds X- 1 5 Wound Imaging (photographs - any number of wounds) '[]'$  - 0 Wound Tracing (instead of photographs) X- 1 5 Simple Wound Measurement - one wound '[]'$  - 0 Complex Wound Measurement - multiple wounds INTERVENTIONS - Wound Dressings X - Small Wound Dressing one or multiple wounds 1 10 '[]'$  - 0 Medium Wound Dressing one or multiple wounds '[]'$  - 0 Large Wound Dressing one or multiple wounds '[]'$  - 0 Application of Medications - topical '[]'$  - 0 Application of Medications - injection INTERVENTIONS - Miscellaneous '[]'$  - 0 External ear exam '[]'$  - 0 Specimen Collection (cultures, biopsies, blood, body fluids, etc.) '[]'$  - 0 Specimen(s) / Culture(s) sent or taken to Lab for analysis '[]'$  - 0 Patient Transfer (multiple staff / Civil Service fast streamer / Similar devices) '[]'$  - 0 Simple Staple / Suture removal (25 or less) '[]'$  - 0 Complex Staple / Suture removal (26 or more) '[]'$  - 0 Hypo / Hyperglycemic Management (close monitor of Blood Glucose) [name], [name] ([id number]) [id number] Page 3 of 9 '[]'$  - 0 Ankle / Brachial Index (ABI) - do not check if billed separately X- 1 5 Vital Signs Has the patient been seen at the hospital within the last three years: Yes Total Score: 85 Level Of Care: New/Established - Level 3 Electronic Signature(s) Signed: 09/01/2022 4:17:37 PM By: Adline Peals Entered By: Adline Peals on 09/01/2022 10:08:45 -------------------------------------------------------------------------------- Encounter Discharge Information Details Patient Name: Date of Service: [name], [name] 09/01/2022 9:15 A M Medical Record Number: [id number] Patient Account Number: [id number] Date of Birth/Sex: Treating RN: September 14, 1956 (66 y.o. [name] Primary Care [name]: [name] Other Clinician: Referring [name]: Treating Devereaux Grayson/Extender: [name] in Treatment: 2 Encounter Discharge Information Items Discharge Condition: Stable Ambulatory Status: Wheelchair Discharge Destination: Home Transportation: Private Auto Accompanied By: self Schedule Follow-up Appointment: Yes Clinical Summary of Care: Patient Declined Electronic Signature(s) Signed: 09/01/2022 4:17:37 PM By: Sabas Sous By: Adline Peals on 09/01/2022 09:51:07 -------------------------------------------------------------------------------- Lower Extremity Assessment Details Patient Name: Date of Service: [name], [name] 09/01/2022 9:15 A M Medical Record Number: [id number] Patient Account Number: [id number] Date of Birth/Sex: Treating RN: [birth year] (66 y.o. [name] Primary Care Sarahy Creedon: [name] Other Clinician: Referring Babita Amaker: Treating Breindy Meadow/Extender: [name] in Treatment: 2 Electronic Signature(s) Signed: 09/01/2022 4:17:37 PM By: Sabas Sous By: Adline Peals on 09/01/2022 09:23:19 -------------------------------------------------------------------------------- Multi Wound Chart Details Patient Name: Date of Service: [name], [name] 09/01/2022 [place] Medical Record Number: [id number] Patient Account Number: [id number] [name], [name] ([id number]) [id number] Page 4 of 9 Date of Birth/Sex: Treating RN: 1955-11-16 (66 y.o. M) Primary Care Mirakle Tomlin: Other Clinician: Cletis [name] Referring Elliot Simoneaux: Treating Bruno Leach/Extender: [name] in Treatment: 2 Vital Signs Height(in): Pulse(bpm): 40 Weight(lbs): Blood Pressure(mmHg): 113/71 Body Mass Index(BMI): Temperature(F): 98.9 Respiratory Rate(breaths/min): 16 [23:Photos:] [N/A:N/A] Abdomen -  midline Distal Abdomen - midline N/A Wound Location: Gradually Appeared Gradually Appeared N/A Wounding Event: Lesion Lesion N/A Primary Etiology: Sleep Apnea, Congestive Heart Sleep Apnea, Congestive Heart N/A Comorbid History: Failure, Hypertension, Type II Failure, Hypertension, Type II Diabetes, Osteoarthritis, Neuropathy Diabetes, Osteoarthritis, Neuropathy 08/05/2022 08/29/2022 N/A Date Acquired: 2 0 N/A Weeks of Treatment: Open Open N/A Wound Status: No No N/A Wound Recurrence: 0x0x0 0.2x0.2x5.4 N/A Measurements L x W x D (cm) 0 0.031 N/A A (cm) : rea 0 0.17 N/A Volume (cm) : 100.00% N/A N/A % Reduction in Area: 100.00% N/A N/A % Reduction in Volume: Full Thickness Without Exposed  Full Thickness Without Exposed N/A Classification: Support Structures Support Structures None Present Medium N/A Exudate Amount: N/A Serosanguineous N/A Exudate Type: N/A red, brown N/A Exudate Color: Flat and Intact Distinct, outline attached N/A Wound Margin: None Present (0%) Large (67-100%) N/A Granulation Amount: N/A Red N/A Granulation Quality: None Present (0%) Small (1-33%) N/A Necrotic Amount: Fascia: No Fat Layer (Subcutaneous Tissue): Yes N/A Exposed Structures: Fat Layer (Subcutaneous Tissue): No Fascia: No Tendon: No Tendon: No Muscle: No Muscle: No Joint: No Joint: No Bone: No Bone: No Large (67-100%) None N/A Epithelialization: No Abnormalities Noted No Abnormalities Noted N/A Periwound Skin Texture: No Abnormalities Noted No Abnormalities Noted N/A Periwound Skin Moisture: No Abnormalities Noted No Abnormalities Noted N/A Periwound Skin Color: No Abnormality No Abnormality N/A Temperature: Treatment Notes Electronic Signature(s) Signed: 09/01/2022 4:13:03 PM By: Linton Ham MD Entered By: Linton Ham on 09/01/2022 09:41:53 -------------------------------------------------------------------------------- Multi-Disciplinary Care Plan  Details Patient Name: Date of Service: [name], [name] 09/01/2022 [place] [facility] Record Number: [id number] Patient Account Number: [id number] [name], [name] ([id number]) [id number] Page 5 of 9 Date of Birth/Sex: Treating RN: 11-Oct-1955 (66 y.o. [name] Primary Care Lorilei Horan: Other Clinician: Cletis [name] Referring Amreen Raczkowski: Treating Macel Yearsley/Extender: [name] in Treatment: 2 Active Inactive Orientation to the Wound Care Program Nursing Diagnoses: Knowledge deficit related to the wound healing center program Goals: Patient/caregiver will verbalize understanding of the [place] Date Initiated: 08/18/2022 Target Resolution Date: 10/21/2022 Goal Status: Active Interventions: Provide education on orientation to the wound center Notes: Wound/Skin Impairment Nursing Diagnoses: Knowledge deficit related to smoking impact on wound healing Goals: Ulcer/skin breakdown will have a volume reduction of 30% by week 4 Date Initiated: 08/18/2022 Target Resolution Date: 10/21/2022 Goal Status: Active Interventions: Provide education on ulcer and skin care Treatment Activities: Skin care regimen initiated : 08/18/2022 Notes: Electronic Signature(s) Signed: 09/01/2022 4:17:37 PM By: Adline Peals Entered By: Adline Peals on 09/01/2022 09:36:33 -------------------------------------------------------------------------------- Pain Assessment Details Patient Name: Date of Service: [name], [name] 09/01/2022 9:15 A M Medical Record Number: [id number] Patient Account Number: [id number] Date of Birth/Sex: Treating RN: [birth year] (66 y.o. [name] Primary Care Keveon Amsler: [name] Other Clinician: Referring Aliza Moret: Treating Hailynn Slovacek/Extender: [name] in Treatment: 2 Active Problems Location of Pain Severity and Description of Pain Patient Has Paino  Yes Site Locations Pain Location: [facility], [facility] ([id number]) [id number] Page 6 of 9 Pain Location: Generalized Pain, Pain in Ulcers Duration of the Pain. Constant / Intermittento Constant Rate the pain. Current Pain Level: 9 Pain Management and Medication Current Pain Management: Electronic Signature(s) Signed: 09/01/2022 4:17:37 PM By: Adline Peals Entered By: Adline Peals on 09/01/2022 09:23:16 -------------------------------------------------------------------------------- Patient/Caregiver Education Details Patient Name: Date of Service: [name], [name] 12/14/2023andnbsp9:15 [name] [facility] Record Number: [id number] Patient Account Number: [id number] Date of Birth/Gender: Treating RN: 1955/12/20 (66 y.o. [name] Primary Care Physician: [name] Other Clinician: Referring Physician: Treating Physician/Extender: [name] in Treatment: 2 Education Assessment Education Provided To: Patient Education Topics Provided Wound/Skin Impairment: Methods: Explain/Verbal Responses: Reinforcements needed, State content correctly Electronic Signature(s) Signed: 09/01/2022 4:17:37 PM By: Sabas Sous By: Adline Peals on 09/01/2022 09:36:47 -------------------------------------------------------------------------------- Wound Assessment Details Patient Name: Date of Service: [name], [name] 09/01/2022 9:15 A M Medical Record Number: [id number] Patient Account Number: [id number] Date of Birth/Sex: Treating RN: March 20, 1956 (66 y.o. [name] Primary Care Raequon Catanzaro: [name] Other Clinician: Referring Kelsei Defino: Treating Rochel Privett/Extender: Pershing Cox, [place] ([id number])  [id number] Page 7 of 9 Weeks in Treatment: 2 Wound Status Wound Number: 23 Primary Lesion Etiology: Wound Location: Abdomen - midline Wound  Open Wounding Event: Gradually Appeared Status: Date Acquired: 08/05/2022 Comorbid Sleep Apnea, Congestive Heart Failure, Hypertension, Type II Weeks Of Treatment: 2 History: Diabetes, Osteoarthritis, Neuropathy Clustered Wound: No Photos Wound Measurements Length: (cm) Width: (cm) Depth: (cm) Area: (cm) Volume: (cm) 0 % Reduction in Area: 100% 0 % Reduction in Volume: 100% 0 Epithelialization: Large (67-100%) 0 Tunneling: No 0 Undermining: No Wound Description Classification: Full Thickness Without Exposed Support Structures Wound Margin: Flat and Intact Exudate Amount: None Present Foul Odor After Cleansing: No Slough/Fibrino No Wound Bed Granulation Amount: None Present (0%) Exposed Structure Necrotic Amount: None Present (0%) Fascia Exposed: No Fat Layer (Subcutaneous Tissue) Exposed: No Tendon Exposed: No Muscle Exposed: No Joint Exposed: No Bone Exposed: No Periwound Skin Texture Texture Color No Abnormalities Noted: Yes No Abnormalities Noted: Yes Moisture Temperature / Pain No Abnormalities Noted: Yes Temperature: No Abnormality Electronic Signature(s) Signed: 09/01/2022 4:17:37 PM By: Adline Peals Signed: 09/01/2022 4:51:25 PM By: Blanche East RN Entered By: Blanche East on 09/01/2022 09:28:36 -------------------------------------------------------------------------------- Wound Assessment Details Patient Name: Date of Service: [name], [name] 09/01/2022 9:15 A M Medical Record Number: [id number] Patient Account Number: [id number] Date of Birth/Sex: Treating RN: 05/14/1956 (66 y.o. [name] Primary Care Koula Venier: [name] Other Clinician: Referring Graciano Batson: Treating Markees Carns/Extender: [name] in Treatment: [place], [place] ([id number]) [phone].pdf Page 8 of 9 Wound Status Wound Number: 24 Primary Lesion Etiology: Wound Location: Distal Abdomen - midline Wound  Open Wounding Event: Gradually Appeared Status: Date Acquired: 08/29/2022 Comorbid Sleep Apnea, Congestive Heart Failure, Hypertension, Type II Weeks Of Treatment: 0 History: Diabetes, Osteoarthritis, Neuropathy Clustered Wound: No Photos Wound Measurements Length: (cm) 0.2 Width: (cm) 0.2 Depth: (cm) 5.4 Area: (cm) 0.031 Volume: (cm) 0.17 % Reduction in Area: % Reduction in Volume: Epithelialization: None Tunneling: No Undermining: No Wound Description Classification: Full Thickness Without Exposed Support Structures Wound Margin: Distinct, outline attached Exudate Amount: Medium Exudate Type: Serosanguineous Exudate Color: red, brown Foul Odor After Cleansing: No Slough/Fibrino Yes Wound Bed Granulation Amount: Large (67-100%) Exposed Structure Granulation Quality: Red Fascia Exposed: No Necrotic Amount: Small (1-33%) Fat Layer (Subcutaneous Tissue) Exposed: Yes Tendon Exposed: No Muscle Exposed: No Joint Exposed: No Bone Exposed: No Periwound Skin Texture Texture Color No Abnormalities Noted: Yes No Abnormalities Noted: Yes Moisture Temperature / Pain No Abnormalities Noted: Yes Temperature: No Abnormality Treatment Notes Wound #24 (Abdomen - midline) Wound Laterality: Distal Cleanser Soap and Water Discharge Instruction: May shower and wash wound with dial antibacterial soap and water prior to dressing change. Peri-Wound Care Topical Primary Dressing Sorbal[place] Ag Ribbon, 1x12 (in/in) Discharge Instruction: Apply to wound bed as instructed Secondary Dressing Zetuvit Plus Silicone Border Dressing 4x4 (in/in) Discharge Instruction: Apply silicone border over primary dressing as directed. Secured With [employer], [place] ([id number]) [id number] Page 9 of 9 Compression Stockings Add-Ons Gloves, Large Electronic Signature(s) Signed: 09/01/2022 4:17:37 PM By: Adline Peals Entered By: Adline Peals on  09/01/2022 09:38:41 -------------------------------------------------------------------------------- Vitals Details Patient Name: Date of Service: [name], Joneric 09/01/2022 9:15 A M Medical Record Number: [id number] Patient Account Number: [id number] Date of Birth/Sex: Treating RN: 1955-11-19 (66 y.o. [name] Primary Care Lenia Housley: [name] Other Clinician: Referring Nare Gaspari: Treating Phylliss Strege/Extender: [name] in Treatment: 2 Vital Signs Time Taken: 09:27 Temperature (F): 98.9 Pulse (bpm): 67 Respiratory Rate (breaths/min): 16 Blood Pressure (mmHg): 113/71  Reference Range: 80 - 120 mg / dl Electronic Signature(s) Signed: 09/01/2022 4:17:37 PM By: Adline Peals Entered By: Adline Peals on 09/01/2022 09:27:19

## 2022-09-04 LAB — AEROBIC CULTURE W GRAM STAIN (SUPERFICIAL SPECIMEN)

## 2022-09-15 ENCOUNTER — Encounter (HOSPITAL_BASED_OUTPATIENT_CLINIC_OR_DEPARTMENT_OTHER): Payer: Medicare Other | Admitting: General Surgery

## 2022-09-15 DIAGNOSIS — E11622 Type 2 diabetes mellitus with other skin ulcer: Secondary | ICD-10-CM | POA: Diagnosis not present

## 2022-09-15 NOTE — Progress Notes (Signed)
[name], [place] ([id number]) [id number] Page 1 of 6 Visit Report for 09/15/2022 Arrival Information Details Patient Name: Date of Service: [name], [name] 09/15/2022 10:00 [place] Record Number: [id number] Patient Account Number: [id number] Date of Birth/Sex: Treating RN: 10-24-1955 (66 y.o. M) Primary Care Lynise Porr: [name] Other Clinician: Referring Wellington Winegarden: Treating Lenford Beddow/Extender: [name] in Treatment: 4 Visit Information History Since Last Visit All ordered tests and consults were completed: No Patient Arrived: Wheel Chair Added or deleted any medications: No Arrival Time: 09:58 Any new allergies or adverse reactions: No Accompanied By: self Had a fall or experienced change in No Transfer Assistance: None activities of daily living that may affect Patient Identification Verified: Yes risk of falls: Secondary Verification Process Completed: Yes Signs or symptoms of abuse/neglect since last visito No Hospitalized since last visit: No Implantable device outside of the clinic excluding No cellular tissue based products placed in the center since last visit: Pain Present Now: No Electronic Signature(s) Signed: 09/15/2022 11:05:54 AM By: Worthy Rancher Entered By: Worthy Rancher on 09/15/2022 09:59:23 -------------------------------------------------------------------------------- Encounter Discharge Information Details Patient Name: Date of Service: [name], [name] 09/15/2022 10:00 [place] Record Number: [id number] Patient Account Number: [id number] Date of Birth/Sex: Treating RN: [birth year] (66 y.o. [name] Primary Care Delance Weide: [name] Other Clinician: Referring Susana Duell: Treating Jarrah Babich/Extender: [name] in Treatment: 4 Encounter Discharge Information Items Discharge Condition: Stable Ambulatory Status: Wheelchair Discharge Destination:  Home Transportation: Private Auto Accompanied By: self Schedule Follow-up Appointment: Yes Clinical Summary of Care: Patient Declined Electronic Signature(s) Signed: 09/15/2022 4:31:58 PM By: Sabas Sous By: Adline Peals on 09/15/2022 10:30:57 [name], [place] ([id number]) [id number] Page 2 of 6 -------------------------------------------------------------------------------- Lower Extremity Assessment Details Patient Name: Date of Service: [name], [name] 09/15/2022 10:00 A M Medical Record Number: [id number] Patient Account Number: [id number] Date of Birth/Sex: Treating RN: 1955/12/17 (66 y.o. [name] Primary Care Avaree Gilberti: [name] Other Clinician: Referring Ariadna Setter: Treating Kaelem Brach/Extender: [name] in Treatment: 4 Electronic Signature(s) Signed: 09/15/2022 4:31:58 PM By: Sabas Sous By: Adline Peals on 09/15/2022 10:04:27 -------------------------------------------------------------------------------- Multi Wound Chart Details Patient Name: Date of Service: [name], [name] 09/15/2022 10:00 A M Medical Record Number: [id number] Patient Account Number: [id number] Date of Birth/Sex: Treating RN: Aug 15, 1956 (66 y.o. M) Primary Care Sonny Poth: [name] Other Clinician: Referring Charly Holcomb: Treating Makailah Slavick/Extender: [name] in Treatment: 4 Vital Signs Height(in): 72 Capillary Blood Glucose(mg/dl): 169 Weight(lbs): 400 Pulse(bpm): 21 Body Mass Index(BMI): 54.2 Blood Pressure(mmHg): 98/66 Temperature(F): 98.5 Respiratory Rate(breaths/min): 20 [24:Photos:] [N/A:N/A] Distal Abdomen - midline N/A N/A Wound Location: Gradually Appeared N/A N/A Wounding Event: Lesion N/A N/A Primary Etiology: Sleep Apnea, Congestive Heart N/A N/A Comorbid History: Failure, Hypertension, Type II Diabetes, Osteoarthritis,  Neuropathy 08/29/2022 N/A N/A Date Acquired: 2 N/A N/A Weeks of Treatment: Open N/A N/A Wound Status: No N/A N/A Wound Recurrence: 0.2x0.2x2.5 N/A N/A Measurements L x W x D (cm) 0.031 N/A N/A A (cm) : rea 0.079 N/A N/A Volume (cm) : 0.00% N/A N/A % Reduction in Area: 53.50% N/A N/A % Reduction in Volume: Full Thickness Without Exposed N/A N/A Classification: Support Structures Medium N/A N/A Exudate Amount: Serosanguineous N/A N/A Exudate Type: red, brown N/A N/A Exudate Color: Distinct, outline attached N/A N/A Wound Margin: Large (67-100%) N/A N/A Granulation Amount: [name] ([id number]) [id number] Page 3 of 6 Red N/A N/A Granulation Quality: None Present (0%) N/A N/A Necrotic Amount: Fat Layer (Subcutaneous Tissue): Yes N/A N/A  Exposed Structures: Fascia: No Tendon: No Muscle: No Joint: No Bone: No None N/A N/A Epithelialization: No Abnormalities Noted N/A N/A Periwound Skin Texture: No Abnormalities Noted N/A N/A Periwound Skin Moisture: No Abnormalities Noted N/A N/A Periwound Skin Color: No Abnormality N/A N/A Temperature: Chemical Cauterization N/A N/A Procedures Performed: Treatment Notes Electronic Signature(s) Signed: 09/15/2022 10:16:43 AM By: Fredirick Maudlin MD FACS Entered By: Fredirick Maudlin on 09/15/2022 10:16:42 -------------------------------------------------------------------------------- Multi-Disciplinary Care Plan Details Patient Name: Date of Service: [name], [name] 09/15/2022 10:00 A M Medical Record Number: [id number] Patient Account Number: [id number] Date of Birth/Sex: Treating RN: [birth year] (66 y.o. [name] Primary Care Keyri Salberg: [name] Other Clinician: Referring Katonya Blecher: Treating Ebony Rickel/Extender: [name] in Treatment: 4 Active Inactive Orientation to the Wound Care Program Nursing Diagnoses: Knowledge deficit related to  the wound healing center program Goals: Patient/caregiver will verbalize understanding of the [place] Program Date Initiated: 08/18/2022 Target Resolution Date: 10/21/2022 Goal Status: Active Interventions: Provide education on orientation to the wound center Notes: Wound/Skin Impairment Nursing Diagnoses: Knowledge deficit related to smoking impact on wound healing Goals: Ulcer/skin breakdown will have a volume reduction of 30% by week 4 Date Initiated: 08/18/2022 Target Resolution Date: 10/21/2022 Goal Status: Active Interventions: Provide education on ulcer and skin care Treatment Activities: Skin care regimen initiated : 08/18/2022 Notes: Electronic Signature(s) Signed: 09/15/2022 4:31:58 PM By: Alphia Moh, [place] ([id number]) [id number] Page 4 of 6 Signed: 09/15/2022 4:31:58 PM By: Sabas Sous By: Adline Peals on 09/15/2022 10:05:14 -------------------------------------------------------------------------------- Pain Assessment Details Patient Name: Date of Service: [name], [name] 09/15/2022 10:00 A M Medical Record Number: [id number] Patient Account Number: [id number] Date of Birth/Sex: Treating RN: 02-08-1956 (66 y.o. M) Primary Care Lillyrose Reitan: [name] Other Clinician: Referring Dorthie Santini: Treating Maryclare Nydam/Extender: [name] in Treatment: 4 Active Problems Location of Pain Severity and Description of Pain Patient Has Paino Yes Site Locations Rate the pain. Current Pain Level: 6 Worst Pain Level: 10 Least Pain Level: 0 Tolerable Pain Level: 2 Pain Management and Medication Current Pain Management: Electronic Signature(s) Signed: 09/15/2022 11:05:54 AM By: Worthy Rancher Entered By: Worthy Rancher on 09/15/2022 10:00:50 -------------------------------------------------------------------------------- Patient/Caregiver Education Details Patient Name: Date of  Service: [name], [name] 12/28/2023andnbsp10:00 [place] Record Number: [id number] Patient Account Number: [id number] Date of Birth/Gender: Treating RN: 09/24/1955 (66 y.o. [name] Primary Care Physician: [name] Other Clinician: Referring Physician: Treating Physician/Extender: [name] in Treatment: 4 Education Assessment Education Provided To: Patient Education Topics Provided NEELY, CECENA ([id number]) [id number] Page 5 of 6 Wound/Skin Impairment: Methods: Explain/Verbal Responses: Reinforcements needed, State content correctly Electronic Signature(s) Signed: 09/15/2022 4:31:58 PM By: Adline Peals Entered By: Adline Peals on 09/15/2022 10:05:30 -------------------------------------------------------------------------------- Wound Assessment Details Patient Name: Date of Service: [name], [name] 09/15/2022 10:00 A M Medical Record Number: [id number] Patient Account Number: [id number] Date of Birth/Sex: Treating RN: 1955-10-06 (66 y.o. M) Primary Care Monterio Bob: [name] Other Clinician: Referring Jerell Demery: Treating Alyssa Mancera/Extender: [name] in Treatment: 4 Wound Status Wound Number: 24 Primary Lesion Etiology: Wound Location: Distal Abdomen - midline Wound Open Wounding Event: Gradually Appeared Status: Date Acquired: 08/29/2022 Comorbid Sleep Apnea, Congestive Heart Failure, Hypertension, Type II Weeks Of Treatment: 2 History: Diabetes, Osteoarthritis, Neuropathy Clustered Wound: No Photos Wound Measurements Length: (cm) 0.2 Width: (cm) 0.2 Depth: (cm) 2.5 Area: (cm) 0.031 Volume: (cm) 0.079 % Reduction in Area: 0% % Reduction in Volume: 53.5% Epithelialization: None Tunneling: No Undermining: No Wound Description  Classification: Full Thickness Without Exposed Support Structures Wound Margin: Distinct, outline attached Exudate  Amount: Medium Exudate Type: Serosanguineous Exudate Color: red, brown Foul Odor After Cleansing: No Slough/Fibrino No Wound Bed Granulation Amount: Large (67-100%) Exposed Structure Granulation Quality: Red Fascia Exposed: No Necrotic Amount: None Present (0%) Fat Layer (Subcutaneous Tissue) Exposed: Yes Tendon Exposed: No Muscle Exposed: No Joint Exposed: No Bone Exposed: No Periwound Skin Texture Texture Color [name], [name] ([id number]) [id number] Page 6 of 6 No Abnormalities Noted: Yes No Abnormalities Noted: Yes Moisture Temperature / Pain No Abnormalities Noted: Yes Temperature: No Abnormality Treatment Notes Wound #24 (Abdomen - midline) Wound Laterality: Distal Cleanser Soap and Water Discharge Instruction: May shower and wash wound with dial antibacterial soap and water prior to dressing change. Peri-Wound Care Topical Skintegrity Hydrogel 4 (oz) Discharge Instruction: Apply hydrogel as directed Primary Dressing Promogran Prisma Matrix, 4.34 (sq in) (silver collagen) Discharge Instruction: Moisten collagen with saline or hydrogel Secondary Dressing Zetuvit Plus Silicone Border Dressing 4x4 (in/in) Discharge Instruction: Apply silicone border over primary dressing as directed. Secured With Compression Wrap Compression Stockings Add-Ons Gloves, Art gallery manager) Signed: 09/15/2022 4:31:58 PM By: Adline Peals Entered By: Adline Peals on 09/15/2022 10:08:02 -------------------------------------------------------------------------------- Vitals Details Patient Name: Date of Service: [name], [name] 09/15/2022 10:00 [place] Record Number: [id number] Patient Account Number: [id number] Date of Birth/Sex: Treating RN: August 28, 1956 (66 y.o. M) Primary Care Keenen Roessner: [name] Other Clinician: Referring Telesia Ates: Treating Yosef Krogh/Extender: [name] in Treatment: 4 Vital  Signs Time Taken: 09:59 Temperature (F): 98.5 Height (in): 72 Pulse (bpm): 69 Weight (lbs): 400 Respiratory Rate (breaths/min): 20 Body Mass Index (BMI): 54.2 Blood Pressure (mmHg): 98/66 Capillary Blood Glucose (mg/dl): 169 Reference Range: 80 - 120 mg / dl Electronic Signature(s) Signed: 09/15/2022 11:05:54 AM By: Worthy Rancher Entered By: Worthy Rancher on 09/15/2022 10:00:23

## 2022-09-15 NOTE — Progress Notes (Signed)
[name] [name], [place] ([id number]) [id number] Page 1 of 10 Visit Report for 09/15/2022 Chief Complaint Document Details Patient Name: Date of Service: [name] [name] 09/15/2022 10:00 [place] Record Number: [id number] Patient Account Number: [id number] Date of Birth/Sex: Treating RN: 03-03-1956 (66 y.o. M) Primary Care Provider: Cletis [name] Other Clinician: Referring Provider: Treating Provider/Extender: [name] [name] in Treatment: 4 Information Obtained from: Patient Chief Complaint 11/09/2018; patient is here for review of wounds on his dorsal right first and second toes and on the dorsal left first toe. 02/22/2022: The patient is here for a new ulcer on his posterior left heel. 08/18/2022: here with new wound on abdomen, similar to prior Electronic Signature(s) Signed: 09/15/2022 10:16:51 AM By: [name] Maudlin MD FACS Entered By: [name] [name] on 09/15/2022 10:16:51 -------------------------------------------------------------------------------- HPI Details Patient Name: Date of Service: [name] [name] [name] [name] 09/15/2022 10:00 [place] Record Number: [id number] Patient Account Number: [id number] Date of Birth/Sex: Treating RN: 1956/07/22 (66 y.o. M) Primary Care Provider: Cletis [name] Other Clinician: Referring Provider: Treating Provider/Extender: [name] [name] in Treatment: 4 History of Present Illness HPI Description: READMISSION 11/09/2018 This is a now 66 year old man that we had in this clinic over a multitude of years but most recently in 2015. He had wounds on his plantar foot initially in 2007 and 2008 and I think subsequently was seen in 2012 then 2014 into the mid part of 2015 with wounds on his toes. He is a type II diabetic with peripheral neuropathy but no known PAD. The patient states he was doing well until about a month ago he noted an area on the left great toe which was superficial and  then an area on the right great and second toes about a week later. He is not sure how this happened he simply noticed this when he was lying in bed at night. He has been using peroxide. He has not seen a doctor. He has not been on antibiotics and has had no x-rays. Past medical history; type 2 diabetes with peripheral neuropathy, hypertension, hypothyroidism, low B12 levels, iron deficiency anemia, ventral hernia, diabetic nephropathy, chronic diastolic heart failure and obstructive sleep apnea. ABIs in this clinic were 1.2 on the right and 1.1 on the left 2/28; patient arrived last week for areas on his left first right first and right second toes. X-ray of the right foot did not show any osseous abnormality. Culture of the right great toe showed Staphylococcus aureus methicillin sensitive and he is on dicloxacillin which he started 3 days ago. 3/6; patient with wounds on his right first and second toes and left first toes. The area over the left first toe is extensive we have been using silver alginate. Culture grew MSSA and he is on dicloxacillin which I prescribed on the 25th. He is still taking this which I am not really sure of the reason. He expressed knowledge that this was 4 times a day he should have been out of this around March 2 if he picked it up on the 25th or they gave him the right number of pills. Nevertheless our nurses report more purulent drainage and odor 3/17-Patient returns for the 3 wounds the left first toe and the right first and second toe dorsal surfaces. The left first toe is closed up and healed, the right first toe dorsal surface wound is extensive and appears the same as last time, no purulent drainage or odor reported per nurse, left second toe wound is slightly smaller. He  has completed the dicloxacillin as of today. Noted that his culture previously had grown MSSA. We have been using calcium alginate to the wounds. X-rays have been reviewed. ABIs were reviewed as  well. 3/27; patient returns for review of wounds on his right first and right second toes. The wound on the left first toe is healed. [name] [name], [name] [name] ([id number]) [id number] Page 2 of 10 4/10; 2-week follow-up. The area on the right second toe dorsally is healed. Per the patient the left first toe remains healed. He still has an extensive area dorsally over the right first toe inner phalangeal joint area. Rolled up hyper granulated tissue with probably a nonviable surface 4/24; 2-week follow-up. The areas on the right second toe dorsally and right first toe dorsally is healed. The area on the dorsal left first toe over the inner phalangeal joint is smaller but still hyper granulated we have been using Hydrofera Blue 5/15; 3-week follow-up. The areas on the right first toe. Not the left as I said on 4/24. He is using Hydrofera Blue. 5/29; 3-week follow-up. The area on his dorsal right toe over the right inter phalangeal joint is just about closed. We have been using Hydrofera Blue. In passing he showed me his right thumb which is exquisitely tender over the tip of the digit. 6/5; the area on the dorsal right toe over the inner phalangeal joint is closed. Right thumb is better which I gave him antibiotics for last week. Still some tenderness but considerably improved READMISSION 02/22/2022 This is a 66 year old type II diabetic with congestive heart failure, end-stage renal disease on hemodialysis, peripheral artery disease, and morbid obesity. He has developed an ulcer on his left posterior heel that he believes is secondary to his health assistance not adequately moisturizing his feet. He says it first appeared about 2 weeks ago. He does not have sensation in his feet. He has not noticed any foul odor or drainage. His last hemoglobin A1c was 7.9 at the beginning of January. ABI in clinic today was 1.15. 03/09/2022: He has a new wound on his abdomen that he says started out  as a lump and then the skin broke open when he bumped it on a portion of his hospital bed at home. There is fat exposed. The wound on his heel is a little bit smaller but still has a very pale surface. There is periwound callus accumulation. 03/30/2022: He had another small wound opened up on his abdomen. He says that he thinks these are starting as blisters. He has been on Mounjaro for weight loss and it looks like the loose skin resulting from losing weight might be rubbing and causing small friction injuries. The wound that I treated with chemical cauterization last visit is much smaller; the new wound has a very similar appearance to this. His heel ulcer is smaller with just a little bit of slough buildup on the surface. He continues to have fairly impressive periwound callus formation. 04/07/2022: Both abdominal wounds are smaller today and there is less hypertrophic granulation tissue protruding. The wound on his heel also continues to contract. There is just a layer of slough on the surface. No concern for infection. 04/29/2022: The previous 2 abdominal wounds have closed but he has a new 1 that appears identical to the previous lesions. The wound on his heel is smaller but has a thick layer of callus overlying it. 05/13/2022: His heel wound has healed. The wound on his abdomen remains small but still has some hypertrophic  granulation tissue present. He says it drains serous fluid from time to time. READMISSION 08/18/2022: Mr. Lise Auer returns with reopening of the small wound on his abdomen. He said it closed and then he recently went swimming and later that day, he noted some serosanguineous drainage on his shirt. He has not had any fevers or chills. The drainage has never been purulent or foul-smelling. As per his previous admission, the wound is small and circular with fat exposed. No concern for infection. 12/14; the patient's original wound that he came in with has healed and is  epithelialized however just below this he has another open area. He noticed this 3 days ago with a significant amount of sanguinous drainage. No pain no obvious purulence. 09/07/2022: The wound is shallower this week. No purulent drainage identified. The culture that was taken grew out just a small amount of Staph aureus pansensitive except to ciprofloxacin. No antibiotic was prescribed by our clinic, but apparently the patient's primary care provider prescribed an antibiotic, but he does not recall the name and it is not available in the electronic medical record. Electronic Signature(s) Signed: 09/15/2022 10:22:29 AM By: [name] Maudlin MD FACS Entered By: [name] [name] on 09/15/2022 10:22:29 -------------------------------------------------------------------------------- Chemical Cauterization Details Patient Name: Date of Service: [name] [name] [name] [name] 09/15/2022 10:00 A M Medical Record Number: [id number] Patient Account Number: [id number] Date of Birth/Sex: Treating RN: Sep 20, 1955 (66 y.o. [name] Primary Care Provider: Cletis [name] Other Clinician: Referring Provider: Treating Provider/Extender: [name] [name] in Treatment: 4 Procedure Performed for: Wound #24 Distal Abdomen - midline Performed By: Physician [name] Maudlin, MD Post Procedure Diagnosis Same as Pre-procedure Notes scribed for Dr. Celine Ahr by Adline Peals, RN Electronic Signature(s) Signed: 09/15/2022 10:27:26 AM By: [name] Maudlin MD FACS Signed: 09/15/2022 4:31:58 PM By: Sabas Sous By: Adline Peals on 09/15/2022 10:13:48 [place], [place] ([id number]) [id number] Page 3 of 10 -------------------------------------------------------------------------------- Physical Exam Details Patient Name: Date of Service: [name] [name] [name] [name] 09/15/2022 10:00 A M Medical Record Number: [id number] Patient Account Number: [id number] Date of  Birth/Sex: Treating RN: 03-05-1956 (66 y.o. M) Primary Care Provider: Cletis [name] Other Clinician: Referring Provider: Treating Provider/Extender: [name] [name] in Treatment: 4 Constitutional Hypotensive but normal for this patient. . . . No acute distress. Respiratory Normal work of breathing on room air. Notes 09/07/2022: The wound is shallower this week. No purulent drainage identified. Electronic Signature(s) Signed: 09/15/2022 10:24:54 AM By: [name] Maudlin MD FACS Entered By: [name] [name] on 09/15/2022 10:24:54 -------------------------------------------------------------------------------- Physician Orders Details Patient Name: Date of Service: [name] [name] [name] [name] 09/15/2022 10:00 [place] Record Number: [id number] Patient Account Number: [id number] Date of Birth/Sex: Treating RN: [birth year] (66 y.o. [name] Primary Care Provider: Cletis [name] Other Clinician: Referring Provider: Treating Provider/Extender: [name] [name] in Treatment: 4 Verbal / Phone Orders: No Diagnosis Coding ICD-10 Coding Code Description L98.499 Non-pressure chronic ulcer of skin of other sites with unspecified severity E66.2 Morbid (severe) obesity with alveolar hypoventilation I73.9 Peripheral vascular disease, unspecified P32.95 Chronic diastolic (congestive) heart failure E11.622 Type 2 diabetes mellitus with other skin ulcer Follow-up Appointments ppointment in 2 weeks. - Dr. Celine Ahr Rm 2 Return A Bathing/ Shower/ Hygiene May shower and wash wound with soap and water. - Change dressing every other day [place] wound care orders this week; continue [place] for wound care. May utilize formulary equivalent dressing for wound treatment orders unless otherwise specified. Dressing changes to be completed by Home Health on Monday /  Wednesday / Friday except when patient has scheduled visit at [facility]. Other Home Health Orders/Instructions: - [facility] Wound Treatment Wound #24 - Abdomen - midline Wound Laterality: Distal [name] [name], [name] [name] ([id number]) [id number] Page 4 of 10 Cleanser: Soap and Water 1 x Per Day/30 Days Discharge Instructions: May shower and wash wound with dial antibacterial soap and water prior to dressing change. Topical: Skintegrity Hydrogel 4 (oz) 1 x Per Day/30 Days Discharge Instructions: Apply hydrogel as directed Prim Dressing: Promogran Prisma Matrix, 4.34 (sq in) (silver collagen) 1 x Per Day/30 Days ary Discharge Instructions: Moisten collagen with saline or hydrogel Secondary Dressing: Zetuvit Plus Silicone Border Dressing 4x4 (in/in) 1 x Per Day/30 Days Discharge Instructions: Apply silicone border over primary dressing as directed. Add-Ons: Gloves, Large 1 x Per Day/30 Days Electronic Signature(s) Signed: 09/15/2022 12:31:25 PM By: [name] Maudlin MD FACS Signed: 09/15/2022 4:31:58 PM By: Adline Peals Previous Signature: 09/15/2022 10:25:10 AM Version By: [name] Maudlin MD FACS Entered By: Adline Peals on 09/15/2022 10:30:08 -------------------------------------------------------------------------------- Problem List Details Patient Name: Date of Service: [name] [name] [name] [name] 09/15/2022 10:00 A M Medical Record Number: [id number] Patient Account Number: [id number] Date of Birth/Sex: Treating RN: 13-Apr-1956 (67 y.o. M) Primary Care Provider: Cletis [name] Other Clinician: Referring Provider: Treating Provider/Extender: [name] [name] in Treatment: 4 Active Problems ICD-10 Encounter Code Description Active Date MDM Diagnosis L98.499 Non-pressure chronic ulcer of skin of other sites with unspecified severity 08/18/2022 No Yes E66.2 Morbid (severe) obesity with alveolar hypoventilation 08/18/2022 No Yes I73.9 Peripheral vascular disease, unspecified 08/18/2022 No Yes J94.17 Chronic  diastolic (congestive) heart failure 08/18/2022 No Yes E11.622 Type 2 diabetes mellitus with other skin ulcer 08/18/2022 No Yes Inactive Problems Resolved Problems Electronic Signature(s) Signed: 09/15/2022 10:16:03 AM By: [name] Maudlin MD FACS Entered By: [name] [name] on 09/15/2022 10:16:03 [name] [name], [name] [name] ([id number]) [id number] Page 5 of 10 -------------------------------------------------------------------------------- Progress Note Details Patient Name: Date of Service: [name] [name] [name] [name] 09/15/2022 10:00 A M Medical Record Number: [id number] Patient Account Number: [id number] Date of Birth/Sex: Treating RN: 03-12-1956 (66 y.o. M) Primary Care Provider: Cletis [name] Other Clinician: Referring Provider: Treating Provider/Extender: [name] [name] in Treatment: 4 Subjective Chief Complaint Information obtained from Patient 11/09/2018; patient is here for review of wounds on his dorsal right first and second toes and on the dorsal left first toe. 02/22/2022: The patient is here for a new ulcer on his posterior left heel. 08/18/2022: here with new wound on abdomen, similar to prior History of Present Illness (HPI) READMISSION 11/09/2018 This is a now 66 year old man that we had in this clinic over a multitude of years but most recently in 2015. He had wounds on his plantar foot initially in 2007 and 2008 and I think subsequently was seen in 2012 then 2014 into the mid part of 2015 with wounds on his toes. He is a type II diabetic with peripheral neuropathy but no known PAD. The patient states he was doing well until about a month ago he noted an area on the left great toe which was superficial and then an area on the right great and second toes about a week later. He is not sure how this happened he simply noticed this when he was lying in bed at night. He has been using peroxide. He has not seen a doctor. He has not been on  antibiotics and has had no x-rays. Past medical history; type 2 diabetes with peripheral neuropathy, hypertension, hypothyroidism, low B12 levels, iron deficiency anemia, ventral  hernia, diabetic nephropathy, chronic diastolic heart failure and obstructive sleep apnea. ABIs in this clinic were 1.2 on the right and 1.1 on the left 2/28; patient arrived last week for areas on his left first right first and right second toes. X-ray of the right foot did not show any osseous abnormality. Culture of the right great toe showed Staphylococcus aureus methicillin sensitive and he is on dicloxacillin which he started 3 days ago. 3/6; patient with wounds on his right first and second toes and left first toes. The area over the left first toe is extensive we have been using silver alginate. Culture grew MSSA and he is on dicloxacillin which I prescribed on the 25th. He is still taking this which I am not really sure of the reason. He expressed knowledge that this was 4 times a day he should have been out of this around March 2 if he picked it up on the 25th or they gave him the right number of pills. Nevertheless our nurses report more purulent drainage and odor 3/17-Patient returns for the 3 wounds the left first toe and the right first and second toe dorsal surfaces. The left first toe is closed up and healed, the right first toe dorsal surface wound is extensive and appears the same as last time, no purulent drainage or odor reported per nurse, left second toe wound is slightly smaller. He has completed the dicloxacillin as of today. Noted that his culture previously had grown MSSA. We have been using calcium alginate to the wounds. X-rays have been reviewed. ABIs were reviewed as well. 3/27; patient returns for review of wounds on his right first and right second toes. The wound on the left first toe is healed. 4/10; 2-week follow-up. The area on the right second toe dorsally is healed. Per the patient the  left first toe remains healed. He still has an extensive area dorsally over the right first toe inner phalangeal joint area. Rolled up hyper granulated tissue with probably a nonviable surface 4/24; 2-week follow-up. The areas on the right second toe dorsally and right first toe dorsally is healed. The area on the dorsal left first toe over the inner phalangeal joint is smaller but still hyper granulated we have been using Hydrofera Blue 5/15; 3-week follow-up. The areas on the right first toe. Not the left as I said on 4/24. He is using Hydrofera Blue. 5/29; 3-week follow-up. The area on his dorsal right toe over the right inter phalangeal joint is just about closed. We have been using Hydrofera Blue. In passing he showed me his right thumb which is exquisitely tender over the tip of the digit. 6/5; the area on the dorsal right toe over the inner phalangeal joint is closed. Right thumb is better which I gave him antibiotics for last week. Still some tenderness but considerably improved READMISSION 02/22/2022 This is a 66 year old type II diabetic with congestive heart failure, end-stage renal disease on hemodialysis, peripheral artery disease, and morbid obesity. He has developed an ulcer on his left posterior heel that he believes is secondary to his health assistance not adequately moisturizing his feet. He says it first appeared about 2 weeks ago. He does not have sensation in his feet. He has not noticed any foul odor or drainage. His last hemoglobin A1c was 7.9 at the beginning of January. ABI in clinic today was 1.15. 03/09/2022: He has a new wound on his abdomen that he says started out as a lump and then the skin  broke open when he bumped it on a portion of his hospital bed at home. There is fat exposed. The wound on his heel is a little bit smaller but still has a very pale surface. There is periwound callus accumulation. 03/30/2022: He had another small wound opened up on his abdomen. He  says that he thinks these are starting as blisters. He has been on Mounjaro for weight loss and it looks like the loose skin resulting from losing weight might be rubbing and causing small friction injuries. The wound that I treated with chemical cauterization last visit is much smaller; the new wound has a very similar appearance to this. His heel ulcer is smaller with just a little bit of slough buildup on the surface. He continues to have fairly impressive periwound callus formation. 04/07/2022: Both abdominal wounds are smaller today and there is less hypertrophic granulation tissue protruding. The wound on his heel also continues to contract. There is just a layer of slough on the surface. No concern for infection. 04/29/2022: The previous 2 abdominal wounds have closed but he has a new 1 that appears identical to the previous lesions. The wound on his heel is smaller but has a thick layer of callus overlying it. 05/13/2022: His heel wound has healed. The wound on his abdomen remains small but still has some hypertrophic granulation tissue present. He says it drains serous fluid from time to time. READMISSION 08/18/2022: Mr. Lise Auer returns with reopening of the small wound on his abdomen. He said it closed and then he recently went swimming and later that day, [place], [place] ([id number]) [id number] Page 6 of 10 he noted some serosanguineous drainage on his shirt. He has not had any fevers or chills. The drainage has never been purulent or foul-smelling. As per his previous admission, the wound is small and circular with fat exposed. No concern for infection. 12/14; the patient's original wound that he came in with has healed and is epithelialized however just below this he has another open area. He noticed this 3 days ago with a significant amount of sanguinous drainage. No pain no obvious purulence. 09/07/2022: The wound is shallower this week. No purulent drainage  identified. The culture that was taken grew out just a small amount of Staph aureus pansensitive except to ciprofloxacin. No antibiotic was prescribed by our clinic, but apparently the patient's primary care provider prescribed an antibiotic, but he does not recall the name and it is not available in the electronic medical record. Patient History Information obtained from Patient. Family History Cancer - Mother,Father, Diabetes - Mother,Father,Siblings, Heart Disease - Siblings, Lung Disease - Mother, No family history of Hereditary Spherocytosis, Hypertension, Kidney Disease, Seizures, Stroke, Thyroid Problems, Tuberculosis. Social History Never smoker, Marital Status - Single, Alcohol Use - Never, Drug Use - No History, Caffeine Use - Daily - Coffee. Medical History Eyes Denies history of Cataracts, Glaucoma, Optic Neuritis Ear/Nose/Mouth/Throat Denies history of Chronic sinus problems/congestion, Middle ear problems Hematologic/Lymphatic Denies history of Anemia, Hemophilia, Human Immunodeficiency Virus, Lymphedema, Sickle Cell Disease Respiratory Patient has history of Sleep Apnea Denies history of Aspiration, Asthma, Chronic Obstructive Pulmonary Disease (COPD), Pneumothorax, Tuberculosis Cardiovascular Patient has history of Congestive Heart Failure, Hypertension Denies history of Angina, Arrhythmia, Coronary Artery Disease, Deep Vein Thrombosis, Hypotension, Myocardial Infarction, Peripheral Arterial Disease, Peripheral Venous Disease, Phlebitis, Vasculitis Gastrointestinal Denies history of Cirrhosis , Colitis, Crohnoos, Hepatitis A, Hepatitis B, Hepatitis C Endocrine Patient has history of Type II Diabetes Denies history of Type I Diabetes Genitourinary Denies  history of End Stage Renal Disease Immunological Denies history of Lupus Erythematosus, Raynaudoos, Scleroderma Integumentary (Skin) Denies history of History of Burn Musculoskeletal Patient has history of  Osteoarthritis Denies history of Gout, Rheumatoid Arthritis, Osteomyelitis Neurologic Patient has history of Neuropathy - Diabetic Denies history of Dementia, Quadriplegia, Paraplegia, Seizure Disorder Oncologic Denies history of Received Chemotherapy, Received Radiation Psychiatric Denies history of Anorexia/bulimia, Confinement Anxiety Objective Constitutional Hypotensive but normal for this patient. No acute distress. Vitals Time Taken: 9:59 AM, Height: 72 in, Weight: 400 lbs, BMI: 54.2, Temperature: 98.5 F, Pulse: 69 bpm, Respiratory Rate: 20 breaths/min, Blood Pressure: 98/66 mmHg, Capillary Blood Glucose: 169 mg/dl. Respiratory Normal work of breathing on room air. General Notes: 09/07/2022: The wound is shallower this week. No purulent drainage identified. Integumentary (Hair, Skin) Wound #24 status is Open. Original cause of wound was Gradually Appeared. The date acquired was: 08/29/2022. The wound has been in treatment 2 weeks. The wound is located on the Distal Abdomen - midline. The wound measures 0.2cm length x 0.2cm width x 2.5cm depth; 0.031cm^2 area and 0.079cm^3 volume. There is Fat Layer (Subcutaneous Tissue) exposed. There is no tunneling or undermining noted. There is a medium amount of serosanguineous drainage noted. The wound margin is distinct with the outline attached to the wound base. There is large (67-100%) red granulation within the wound bed. There is no necrotic tissue within the wound bed. The periwound skin appearance had no abnormalities noted for texture. The periwound skin appearance had no abnormalities noted for moisture. The periwound skin appearance had no abnormalities noted for color. Periwound temperature was noted as No Abnormality. 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 An Chemical Cauterization procedure was performed by [name] Maudlin, MD. Post procedure Diagnosis Wound #24: Same as Pre-Procedure Notes: scribed for Dr. Celine Ahr by Adline Peals, RN Plan Follow-up Appointments: Return Appointment in 2 weeks. - Dr. Alvera Singh 2 Bathing/ Shower/ Hygiene: May shower and wash wound with soap and water. - Change dressing every other day Home Health: No change in wound care orders this week; continue Home Health for wound care. May utilize formulary equivalent dressing for wound treatment orders unless otherwise specified. Dressing changes to be completed by [facility] on Monday / Wednesday / Friday except when patient has scheduled visit at [facility]. Other Home Health Orders/Instructions: - [facility] WOUND #24: - Abdomen - midline Wound Laterality: Distal Cleanser: Soap and Water 1 x Per Day/30 Days Discharge Instructions: May shower and wash wound with dial antibacterial soap and water prior to dressing change. Prim Dressing: Iodoform packing strip 1/4 (in) 1 x Per Day/30 Days ary Discharge Instructions: Lightly pack as instructed Secondary Dressing: Zetuvit Plus Silicone Border Dressing 4x4 (in/in) 1 x Per Day/30 Days Discharge Instructions: Apply silicone border over primary dressing as directed. Add-Ons: Gloves, Large 1 x Per Day/30 Days 09/07/2022: The wound is shallower this week. No purulent drainage identified. No debridement was necessary. Previous wounds of this nature in this patient have responded well to chemical cauterization, so I chemically cauterized the tissue with silver nitrate. Once this was done, the tract was narrower and shallower and we were unable  to pack  the iodoform strips into it. We will instead pack the wound with Prisma silver collagen moistened with hydrogel. Follow-up in 2 weeks. Electronic Signature(s) Signed: 09/15/2022 10:25:57 AM By: [name] Maudlin MD FACS Entered By: [name] [name] on 09/15/2022 10:25:56 -------------------------------------------------------------------------------- HxROS Details Patient Name: Date of Service: [name] [name] [name] [name] 09/15/2022 10:00 [place] Record Number: [id number] Patient Account Number: [id number] Date of Birth/Sex: Treating RN: [birth year] (66 y.o. M) Primary Care Provider: Cletis [name] Other Clinician: Referring Provider: Treating Provider/Extender: [name] [name] in Treatment: [place], [place] ([id number]) [phone].pdf Page 8 of 10 Information Obtained From Patient Eyes Medical History: Negative for: Cataracts; Glaucoma; Optic Neuritis Ear/Nose/Mouth/Throat Medical History: Negative for: Chronic sinus problems/congestion; Middle ear problems Hematologic/Lymphatic Medical History: Negative for: Anemia; Hemophilia; Human Immunodeficiency Virus; Lymphedema; Sickle Cell Disease Respiratory Medical History: Positive for: Sleep Apnea Negative for: Aspiration; Asthma; Chronic Obstructive Pulmonary Disease (COPD); Pneumothorax; Tuberculosis Cardiovascular Medical History: Positive for: Congestive Heart Failure; Hypertension Negative for: Angina; Arrhythmia; Coronary Artery Disease; Deep Vein Thrombosis; Hypotension; Myocardial Infarction; Peripheral Arterial Disease; Peripheral Venous Disease; Phlebitis; Vasculitis Gastrointestinal Medical History: Negative for: Cirrhosis ; Colitis; Crohns; Hepatitis A; Hepatitis B; Hepatitis C Endocrine Medical History: Positive for: Type II Diabetes Negative for: Type I Diabetes Time with diabetes: 1994 Treated with: Insulin Blood sugar tested every day: No Genitourinary Medical  History: Negative for: End Stage Renal Disease Immunological Medical History: Negative for: Lupus Erythematosus; Raynauds; Scleroderma Integumentary (Skin) Medical History: Negative for: History of Burn Musculoskeletal Medical History: Positive for: Osteoarthritis Negative for: Gout; Rheumatoid Arthritis; Osteomyelitis Neurologic Medical History: Positive for: Neuropathy - Diabetic Negative for: Dementia; Quadriplegia; Paraplegia; Seizure Disorder Oncologic Medical History: Negative for: Received Chemotherapy; Received Miles, [name] [name] ([id number]) [id number] Page 9 of 10 Medical History: Negative for: Anorexia/bulimia; Confinement Anxiety Immunizations Pneumococcal Vaccine: Received Pneumococcal Vaccination: No Implantable Devices No devices added Family and Social History Cancer: Yes - Mother,Father; Diabetes: Yes - Mother,Father,Siblings; Heart Disease: Yes - Siblings; Hereditary Spherocytosis: No; Hypertension: No; Kidney Disease: No; Lung Disease: Yes - Mother; Seizures: No; Stroke: No; Thyroid Problems: No; Tuberculosis: No; Never smoker; Marital Status - Single; Alcohol Use: Never; Drug Use: No History; Caffeine Use: Daily - Coffee; Financial Concerns: No; Food, Clothing or Shelter Needs: No; Support System Lacking: No; Transportation Concerns: No Electronic Signature(s) Signed: 09/15/2022 10:27:26 AM By: [name] Maudlin MD FACS Entered By: [name] [name] on 09/15/2022 10:24:12 -------------------------------------------------------------------------------- SuperBill Details Patient Name: Date of Service: [name] [name] [name] [name] 09/15/2022 Medical Record Number: [id number] Patient Account Number: [id number] Date of Birth/Sex: Treating RN: 02-Jun-1956 (66 y.o. M) Primary Care Provider: Cletis [name] Other Clinician: Referring Provider: Treating Provider/Extender: [name] [name] in Treatment:  4 Diagnosis Coding ICD-10 Codes Code Description [phone] Non-pressure chronic ulcer of skin of other sites with unspecified severity E66.2 Morbid (severe) obesity with alveolar hypoventilation I73.9 Peripheral vascular disease, unspecified O27.03 Chronic diastolic (congestive) heart failure E11.622 Type 2 diabetes mellitus with other skin ulcer Facility Procedures : CPT4 Code: [id number] Description: 29937 - CHEM CAUT GRANULATION TISS ICD-10 Diagnosis Description L98.499 Non-pressure chronic ulcer of skin of other sites with unspecified severity Modifier: Quantity: 1 Physician Procedures : CPT4 Code Description Modifier [id number] - WC PHYS LEVEL 4 - EST PT ICD-10 Diagnosis Description L98.499 Non-pressure chronic ulcer of skin of other sites with unspecified severity E66.2 Morbid (severe) obesity with alveolar hypoventilation B51.02  Chronic diastolic (congestive) heart failure E11.622 Type 2 diabetes mellitus with other skin ulcer Quantity: 1 : [id number] - WC PHYS CHEM CAUT GRAN  TISSUE ICD-10 Diagnosis Description L98.499 Non-pressure chronic ulcer of skin of other sites with unspecified severity Quantity: 1 Electronic Signature(s) [place], [name] [name] ([id number]) [id number] Page 10 of 10 Signed: 09/15/2022 10:26:30 AM By: [name] Maudlin MD FACS Entered By: [name] [name] on 09/15/2022 10:26:30

## 2022-09-29 ENCOUNTER — Encounter (HOSPITAL_BASED_OUTPATIENT_CLINIC_OR_DEPARTMENT_OTHER): Payer: Medicare PPO | Attending: General Surgery | Admitting: General Surgery

## 2022-09-29 DIAGNOSIS — Z992 Dependence on renal dialysis: Secondary | ICD-10-CM | POA: Diagnosis not present

## 2022-09-29 DIAGNOSIS — I132 Hypertensive heart and chronic kidney disease with heart failure and with stage 5 chronic kidney disease, or end stage renal disease: Secondary | ICD-10-CM | POA: Insufficient documentation

## 2022-09-29 DIAGNOSIS — Z6841 Body Mass Index (BMI) 40.0 and over, adult: Secondary | ICD-10-CM | POA: Insufficient documentation

## 2022-09-29 DIAGNOSIS — E662 Morbid (severe) obesity with alveolar hypoventilation: Secondary | ICD-10-CM | POA: Insufficient documentation

## 2022-09-29 DIAGNOSIS — E1151 Type 2 diabetes mellitus with diabetic peripheral angiopathy without gangrene: Secondary | ICD-10-CM | POA: Insufficient documentation

## 2022-09-29 DIAGNOSIS — N186 End stage renal disease: Secondary | ICD-10-CM | POA: Diagnosis not present

## 2022-09-29 DIAGNOSIS — E1122 Type 2 diabetes mellitus with diabetic chronic kidney disease: Secondary | ICD-10-CM | POA: Insufficient documentation

## 2022-09-29 DIAGNOSIS — L98492 Non-pressure chronic ulcer of skin of other sites with fat layer exposed: Secondary | ICD-10-CM | POA: Insufficient documentation

## 2022-09-29 DIAGNOSIS — E11622 Type 2 diabetes mellitus with other skin ulcer: Secondary | ICD-10-CM | POA: Diagnosis present

## 2022-09-29 DIAGNOSIS — I5032 Chronic diastolic (congestive) heart failure: Secondary | ICD-10-CM | POA: Diagnosis not present

## 2022-09-29 NOTE — Progress Notes (Addendum)
[name], [name] ([id number]) [id number] Page 1 of 9 Visit Report for 09/29/2022 Chief Complaint Document Details Patient Name: Date of Service: [name], [name] 09/29/2022 10:00 A M Medical Record Number: [id number] Patient Account Number: [id number] Date of Birth/Sex: Treating RN: 08-01-1956 (67 y.o. M) Primary Care Provider: Cletis [name] Other Clinician: Referring Provider: Treating Provider/Extender: [name] in Treatment: 6 Information Obtained from: Patient Chief Complaint 11/09/2018; patient is here for review of wounds on his dorsal right first and second toes and on the dorsal left first toe. 02/22/2022: The patient is here for a new ulcer on his posterior left heel. 08/18/2022: here with new wound on abdomen, similar to prior Electronic Signature(s) Signed: 09/29/2022 10:24:08 AM By: [name] Maudlin MD FACS Entered By: [name] on 09/29/2022 10:24:08 -------------------------------------------------------------------------------- HPI Details Patient Name: Date of Service: [name], [name] 09/29/2022 10:00 A M Medical Record Number: [id number] Patient Account Number: [id number] Date of Birth/Sex: Treating RN: December 14, 1955 (67 y.o. M) Primary Care Provider: Cletis [name] Other Clinician: Referring Provider: Treating Provider/Extender: [name] in Treatment: 6 History of Present Illness HPI Description: READMISSION 11/09/2018 This is a now 67 year old man that we had in this clinic over a multitude of years but most recently in 2015. He had wounds on his plantar foot initially in 2007 and 2008 and I think subsequently was seen in 2012 then 2014 into the mid part of 2015 with wounds on his toes. He is a type II diabetic with peripheral neuropathy but no known PAD. The patient states he was doing well until about a month ago he noted an area on the left great toe which was superficial and then  an area on the right great and second toes about a week later. He is not sure how this happened he simply noticed this when he was lying in bed at night. He has been using peroxide. He has not seen a doctor. He has not been on antibiotics and has had no x-rays. Past medical history; type 2 diabetes with peripheral neuropathy, hypertension, hypothyroidism, low B12 levels, iron deficiency anemia, ventral hernia, diabetic nephropathy, chronic diastolic heart failure and obstructive sleep apnea. ABIs in this clinic were 1.2 on the right and 1.1 on the left 2/28; patient arrived last week for areas on his left first right first and right second toes. X-ray of the right foot did not show any osseous abnormality. Culture of the right great toe showed Staphylococcus aureus methicillin sensitive and he is on dicloxacillin which he started 3 days ago. 3/6; patient with wounds on his right first and second toes and left first toes. The area over the left first toe is extensive we have been using silver alginate. Culture grew MSSA and he is on dicloxacillin which I prescribed on the 25th. He is still taking this which I am not really sure of the reason. He expressed knowledge that this was 4 times a day he should have been out of this around March 2 if he picked it up on the 25th or they gave him the right number of pills. Nevertheless our nurses report more purulent drainage and odor 3/17-Patient returns for the 3 wounds the left first toe and the right first and second toe dorsal surfaces. The left first toe is closed up and healed, the right first toe dorsal surface wound is extensive and appears the same as last time, no purulent drainage or odor reported per nurse, left second toe wound is slightly smaller. He  has completed the dicloxacillin as of today. Noted that his culture previously had grown MSSA. We have been using calcium alginate to the wounds. X-rays have been reviewed. ABIs were reviewed as  well. 3/27; patient returns for review of wounds on his right first and right second toes. The wound on the left first toe is healed. [name], [name] ([id number]) [id number] Page 2 of 9 4/10; 2-week follow-up. The area on the right second toe dorsally is healed. Per the patient the left first toe remains healed. He still has an extensive area dorsally over the right first toe inner phalangeal joint area. Rolled up hyper granulated tissue with probably a nonviable surface 4/24; 2-week follow-up. The areas on the right second toe dorsally and right first toe dorsally is healed. The area on the dorsal left first toe over the inner phalangeal joint is smaller but still hyper granulated we have been using Hydrofera Blue 5/15; 3-week follow-up. The areas on the right first toe. Not the left as I said on 4/24. He is using Hydrofera Blue. 5/29; 3-week follow-up. The area on his dorsal right toe over the right inter phalangeal joint is just about closed. We have been using Hydrofera Blue. In passing he showed me his right thumb which is exquisitely tender over the tip of the digit. 6/5; the area on the dorsal right toe over the inner phalangeal joint is closed. Right thumb is better which I gave him antibiotics for last week. Still some tenderness but considerably improved READMISSION 02/22/2022 This is a 67 year old type II diabetic with congestive heart failure, end-stage renal disease on hemodialysis, peripheral artery disease, and morbid obesity. He has developed an ulcer on his left posterior heel that he believes is secondary to his health assistance not adequately moisturizing his feet. He says it first appeared about 2 weeks ago. He does not have sensation in his feet. He has not noticed any foul odor or drainage. His last hemoglobin A1c was 7.9 at the beginning of January. ABI in clinic today was 1.15. 03/09/2022: He has a new wound on his abdomen that he says started out as  a lump and then the skin broke open when he bumped it on a portion of his hospital bed at home. There is fat exposed. The wound on his heel is a little bit smaller but still has a very pale surface. There is periwound callus accumulation. 03/30/2022: He had another small wound opened up on his abdomen. He says that he thinks these are starting as blisters. He has been on Mounjaro for weight loss and it looks like the loose skin resulting from losing weight might be rubbing and causing small friction injuries. The wound that I treated with chemical cauterization last visit is much smaller; the new wound has a very similar appearance to this. His heel ulcer is smaller with just a little bit of slough buildup on the surface. He continues to have fairly impressive periwound callus formation. 04/07/2022: Both abdominal wounds are smaller today and there is less hypertrophic granulation tissue protruding. The wound on his heel also continues to contract. There is just a layer of slough on the surface. No concern for infection. 04/29/2022: The previous 2 abdominal wounds have closed but he has a new 1 that appears identical to the previous lesions. The wound on his heel is smaller but has a thick layer of callus overlying it. 05/13/2022: His heel wound has healed. The wound on his abdomen remains small but still has some hypertrophic  granulation tissue present. He says it drains serous fluid from time to time. READMISSION 08/18/2022: [name] returns with reopening of the small wound on his abdomen. He said it closed and then he recently went swimming and later that day, he noted some serosanguineous drainage on his shirt. He has not had any fevers or chills. The drainage has never been purulent or foul-smelling. As per his previous admission, the wound is small and circular with fat exposed. No concern for infection. 12/14; the patient's original wound that he came in with has healed and is epithelialized  however just below this he has another open area. He noticed this 3 days ago with a significant amount of sanguinous drainage. No pain no obvious purulence. 09/07/2022: The wound is shallower this week. No purulent drainage identified. The culture that was taken grew out just a small amount of Staph aureus pansensitive except to ciprofloxacin. No antibiotic was prescribed by our clinic, but apparently the patient's primary care provider prescribed an antibiotic, but he does not recall the name and it is not available in the electronic medical record. 09/29/2022: Apparently the patient's home health providers have not been packing the wound. It now probes to a depth of 2.4 cm. It is clean without any slough or purulent drainage. No concern for infection. Electronic Signature(s) Signed: 09/29/2022 10:25:09 AM By: [name] Maudlin MD FACS Entered By: [name] on 09/29/2022 10:25:09 -------------------------------------------------------------------------------- Physical Exam Details Patient Name: Date of Service: [name], [name] 09/29/2022 10:00 A M Medical Record Number: [id number] Patient Account Number: [id number] Date of Birth/Sex: Treating RN: 04/27/1956 (67 y.o. M) Primary Care Provider: Cletis [name] Other Clinician: Referring Provider: Treating Provider/Extender: [name] in Treatment: 6 Constitutional . . . . no acute distress. Respiratory . Notes 09/29/2022: Apparently the patient's home health providers have not been packing the wound. It now probes to a depth of 2.4 cm. It is clean without any slough or purulent drainage. No concern for infection. Electronic Signature(s) Signed: 09/29/2022 10:25:42 AM By: [name] Maudlin MD Chandler, Andrew ([id number]) [id number] Page 3 of 9 Signed: 09/29/2022 10:25:42 AM By: [name] Maudlin MD FACS Entered By: [name] on 09/29/2022  10:25:42 -------------------------------------------------------------------------------- Physician Orders Details Patient Name: Date of Service: [name], [name] 09/29/2022 10:00 A M Medical Record Number: [id number] Patient Account Number: [id number] Date of Birth/Sex: Treating RN: Aug 27, 1956 (67 y.o. [name] Primary Care Provider: Cletis [name] Other Clinician: Referring Provider: Treating Provider/Extender: [name] in Treatment: 6 Verbal / Phone Orders: No Diagnosis Coding ICD-10 Coding Code Description L98.499 Non-pressure chronic ulcer of skin of other sites with unspecified severity E66.2 Morbid (severe) obesity with alveolar hypoventilation I73.9 Peripheral vascular disease, unspecified Y86.57 Chronic diastolic (congestive) heart failure E11.622 Type 2 diabetes mellitus with other skin ulcer Follow-up Appointments ppointment in 2 weeks. - Dr. Celine Ahr Rm 2 Return A Bathing/ Shower/ Hygiene May shower and wash wound with soap and water. - Change dressing every other day [facility] wound care orders this week; continue Home Health for wound care. May utilize formulary equivalent dressing for wound treatment orders unless otherwise specified. - THERE IS DEPTH TO THE WOUND 2.4 CM PLEASE PACK THE WOUND USING A SKINNY PROBE TO ADEQUATELY GET THE DRESSING IN THE WOUND Dressing changes to be completed by [facility] on Monday / Wednesday / Friday except when patient has scheduled visit at [facility]. Other Home Health Orders/Instructions: - [facility]ound [facility] Wound #24 - Abdomen - midline  Wound Laterality: Distal Cleanser: Soap and Water 3 x Per Week/30 Days Discharge Instructions: May shower and wash wound with dial antibacterial soap and water prior to dressing change. Prim Dressing: Iodoform packing strip 1/4 (in) 3 x Per Week/30 Days ary Discharge Instructions: Lightly pack as instructed Secondary Dressing: Zetuvit  Plus Silicone Border Dressing 4x4 (in/in) 3 x Per Week/30 Days Discharge Instructions: Apply silicone border over primary dressing as directed. Add-Ons: Gloves, Large 3 x Per Week/30 Days Electronic Signature(s) Signed: 09/29/2022 10:28:10 AM By: [name] Maudlin MD FACS Entered By: [name] on 09/29/2022 10:28:10 -------------------------------------------------------------------------------- Problem List Details Patient Name: Date of Service: [name], Cope 09/29/2022 10:00 A M Medical Record Number: [id number] Patient Account Number: [id number] AXLE, PARFAIT ([id number]) [id number] Page 4 of 9 Date of Birth/Sex: Treating RN: 07/16/1956 (67 y.o. M) Primary Care Provider: Other Clinician: Cletis [name] Referring Provider: Treating Provider/Extender: [name] in Treatment: 6 Active Problems ICD-10 Encounter Code Description Active Date MDM Diagnosis L98.499 Non-pressure chronic ulcer of skin of other sites with unspecified severity 08/18/2022 No Yes E66.2 Morbid (severe) obesity with alveolar hypoventilation 08/18/2022 No Yes I73.9 Peripheral vascular disease, unspecified 08/18/2022 No Yes O24.23 Chronic diastolic (congestive) heart failure 08/18/2022 No Yes E11.622 Type 2 diabetes mellitus with other skin ulcer 08/18/2022 No Yes Inactive Problems Resolved Problems Electronic Signature(s) Signed: 09/29/2022 10:23:55 AM By: [name] Maudlin MD FACS Entered By: [name] on 09/29/2022 10:23:55 -------------------------------------------------------------------------------- Progress Note Details Patient Name: Date of Service: [name], [name] 09/29/2022 10:00 A M Medical Record Number: [id number] Patient Account Number: [id number] Date of Birth/Sex: Treating RN: 12-Nov-1955 (67 y.o. M) Primary Care Provider: Cletis [name] Other Clinician: Referring Provider: Treating Provider/Extender: [name] in Treatment: 6 Subjective Chief Complaint Information obtained from Patient 11/09/2018; patient is here for review of wounds on his dorsal right first and second toes and on the dorsal left first toe. 02/22/2022: The patient is here for a new ulcer on his posterior left heel. 08/18/2022: here with new wound on abdomen, similar to prior History of Present Illness (HPI) READMISSION 11/09/2018 This is a now 67 year old man that we had in this clinic over a multitude of years but most recently in 2015. He had wounds on his plantar foot initially in 2007 and 2008 and I think subsequently was seen in 2012 then 2014 into the mid part of 2015 with wounds on his toes. He is a type II diabetic with peripheral neuropathy but no known PAD. The patient states he was doing well until about a month ago he noted an area on the left great toe which was superficial and then an area on the right great and second toes about a week later. He is not sure how this happened he simply noticed this when he was lying in bed at night. He has been using peroxide. He has not seen a doctor. He has not been on antibiotics and has had no x-rays. Past medical history; type 2 diabetes with peripheral neuropathy, hypertension, hypothyroidism, low B12 levels, iron deficiency anemia, ventral hernia, diabetic [name], Michelangelo ([id number]) [id number] Page 5 of 9 nephropathy, chronic diastolic heart failure and obstructive sleep apnea. ABIs in this clinic were 1.2 on the right and 1.1 on the left 2/28; patient arrived last week for areas on his left first right first and right second toes. X-ray of the right foot did not show any osseous abnormality. Culture of the right great toe showed Staphylococcus aureus methicillin sensitive and he is  on dicloxacillin which he started 3 days ago. 3/6; patient with wounds on his right first and second toes and left first toes. The area over the left first toe  is extensive we have been using silver alginate. Culture grew MSSA and he is on dicloxacillin which I prescribed on the 25th. He is still taking this which I am not really sure of the reason. He expressed knowledge that this was 4 times a day he should have been out of this around March 2 if he picked it up on the 25th or they gave him the right number of pills. Nevertheless our nurses report more purulent drainage and odor 3/17-Patient returns for the 3 wounds the left first toe and the right first and second toe dorsal surfaces. The left first toe is closed up and healed, the right first toe dorsal surface wound is extensive and appears the same as last time, no purulent drainage or odor reported per nurse, left second toe wound is slightly smaller. He has completed the dicloxacillin as of today. Noted that his culture previously had grown MSSA. We have been using calcium alginate to the wounds. X-rays have been reviewed. ABIs were reviewed as well. 3/27; patient returns for review of wounds on his right first and right second toes. The wound on the left first toe is healed. 4/10; 2-week follow-up. The area on the right second toe dorsally is healed. Per the patient the left first toe remains healed. He still has an extensive area dorsally over the right first toe inner phalangeal joint area. Rolled up hyper granulated tissue with probably a nonviable surface 4/24; 2-week follow-up. The areas on the right second toe dorsally and right first toe dorsally is healed. The area on the dorsal left first toe over the inner phalangeal joint is smaller but still hyper granulated we have been using Hydrofera Blue 5/15; 3-week follow-up. The areas on the right first toe. Not the left as I said on 4/24. He is using Hydrofera Blue. 5/29; 3-week follow-up. The area on his dorsal right toe over the right inter phalangeal joint is just about closed. We have been using Hydrofera Blue. In passing he showed me his  right thumb which is exquisitely tender over the tip of the digit. 6/5; the area on the dorsal right toe over the inner phalangeal joint is closed. Right thumb is better which I gave him antibiotics for last week. Still some tenderness but considerably improved READMISSION 02/22/2022 This is a 67 year old type II diabetic with congestive heart failure, end-stage renal disease on hemodialysis, peripheral artery disease, and morbid obesity. He has developed an ulcer on his left posterior heel that he believes is secondary to his health assistance not adequately moisturizing his feet. He says it first appeared about 2 weeks ago. He does not have sensation in his feet. He has not noticed any foul odor or drainage. His last hemoglobin A1c was 7.9 at the beginning of January. ABI in clinic today was 1.15. 03/09/2022: He has a new wound on his abdomen that he says started out as a lump and then the skin broke open when he bumped it on a portion of his hospital bed at home. There is fat exposed. The wound on his heel is a little bit smaller but still has a very pale surface. There is periwound callus accumulation. 03/30/2022: He had another small wound opened up on his abdomen. He says that he thinks these are starting as blisters. He has been on  Mounjaro for weight loss and it looks like the loose skin resulting from losing weight might be rubbing and causing small friction injuries. The wound that I treated with chemical cauterization last visit is much smaller; the new wound has a very similar appearance to this. His heel ulcer is smaller with just a little bit of slough buildup on the surface. He continues to have fairly impressive periwound callus formation. 04/07/2022: Both abdominal wounds are smaller today and there is less hypertrophic granulation tissue protruding. The wound on his heel also continues to contract. There is just a layer of slough on the surface. No concern for infection. 04/29/2022: The  previous 2 abdominal wounds have closed but he has a new 1 that appears identical to the previous lesions. The wound on his heel is smaller but has a thick layer of callus overlying it. 05/13/2022: His heel wound has healed. The wound on his abdomen remains small but still has some hypertrophic granulation tissue present. He says it drains serous fluid from time to time. READMISSION 08/18/2022: [name] returns with reopening of the small wound on his abdomen. He said it closed and then he recently went swimming and later that day, he noted some serosanguineous drainage on his shirt. He has not had any fevers or chills. The drainage has never been purulent or foul-smelling. As per his previous admission, the wound is small and circular with fat exposed. No concern for infection. 12/14; the patient's original wound that he came in with has healed and is epithelialized however just below this he has another open area. He noticed this 3 days ago with a significant amount of sanguinous drainage. No pain no obvious purulence. 09/07/2022: The wound is shallower this week. No purulent drainage identified. The culture that was taken grew out just a small amount of Staph aureus pansensitive except to ciprofloxacin. No antibiotic was prescribed by our clinic, but apparently the patient's primary care provider prescribed an antibiotic, but he does not recall the name and it is not available in the electronic medical record. 09/29/2022: Apparently the patient's home health providers have not been packing the wound. It now probes to a depth of 2.4 cm. It is clean without any slough or purulent drainage. No concern for infection. Patient History Information obtained from Patient. Family History Cancer - Mother,Father, Diabetes - Mother,Father,Siblings, Heart Disease - Siblings, Lung Disease - Mother, No family history of Hereditary Spherocytosis, Hypertension, Kidney Disease, Seizures, Stroke, Thyroid  Problems, Tuberculosis. Social History Never smoker, Marital Status - Single, Alcohol Use - Never, Drug Use - No History, Caffeine Use - Daily - Coffee. Medical History Eyes Denies history of Cataracts, Glaucoma, Optic Neuritis Ear/Nose/Mouth/Throat Denies history of Chronic sinus problems/congestion, Middle ear problems Hematologic/Lymphatic Denies history of Anemia, Hemophilia, Human Immunodeficiency Virus, Lymphedema, Sickle Cell Disease Respiratory Patient has history of Sleep Apnea Denies history of Aspiration, Asthma, Chronic Obstructive Pulmonary Disease (COPD), Pneumothorax, Tuberculosis Cardiovascular Patient has history of Congestive Heart Failure, Hypertension Denies history of Angina, Arrhythmia, Coronary Artery Disease, Deep Vein Thrombosis, Hypotension, Myocardial Infarction, Peripheral Arterial Disease, Peripheral Venous Disease, Phlebitis, Vasculitis Gastrointestinal Denies history of Cirrhosis , Colitis, Crohnoos, Hepatitis A, Hepatitis [name], [name], [name] ([id number]) [id number] Page 6 of 9 Endocrine Patient has history of Type II Diabetes Denies history of Type I Diabetes Genitourinary Denies history of End Stage Renal Disease Immunological Denies history of Lupus Erythematosus, Raynaudoos, Scleroderma Integumentary (Skin) Denies history of History of Burn Musculoskeletal Patient has history of Osteoarthritis Denies history of  Gout, Rheumatoid Arthritis, Osteomyelitis Neurologic Patient has history of Neuropathy - Diabetic Denies history of Dementia, Quadriplegia, Paraplegia, Seizure Disorder Oncologic Denies history of Received Chemotherapy, Received Radiation Psychiatric Denies history of Anorexia/bulimia, Confinement Anxiety Objective Constitutional no acute distress. Vitals Time Taken: 10:02 AM, Height: 72 in, Weight: 400 lbs, BMI: 54.2, Temperature: 98.2 F, Pulse: 63 bpm, Respiratory Rate: 18 breaths/min,  Blood Pressure: 108/69 mmHg. General Notes: 09/29/2022: Apparently the patient's home health providers have not been packing the wound. It now probes to a depth of 2.4 cm. It is clean without any slough or purulent drainage. No concern for infection. Integumentary (Hair, Skin) Wound #24 status is Open. Original cause of wound was Gradually Appeared. The date acquired was: 08/29/2022. The wound has been in treatment 4 weeks. The wound is located on the Distal Abdomen - midline. The wound measures 0.5cm length x 0.4cm width x 2.4cm depth; 0.157cm^2 area and 0.377cm^3 volume. There is Fat Layer (Subcutaneous Tissue) exposed. There is no tunneling or undermining noted. There is a medium amount of serosanguineous drainage noted. The wound margin is distinct with the outline attached to the wound base. There is large (67-100%) red granulation within the wound bed. There is a small (1-33%) amount of necrotic tissue within the wound bed including Adherent Slough. The periwound skin appearance had no abnormalities noted for texture. The periwound skin appearance had no abnormalities noted for moisture. The periwound skin appearance had no abnormalities noted for color. Periwound temperature was noted as No Abnormality. 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 - Dr. Celine Ahr Rm 2 Bathing/ Shower/ Hygiene: May shower and wash wound with soap and water. - Change dressing every other day Home Health: New wound care orders this week; continue Home Health for wound care. May utilize formulary equivalent dressing for wound treatment orders unless otherwise specified. - THERE IS DEPTH TO THE WOUND  2.4 CM PLEASE PACK THE WOUND USING A SKINNY PROBE TO ADEQUATELY GET THE DRESSING IN THE WOUND Dressing changes to be completed by [facility] on Monday / Wednesday / Friday except when patient has scheduled visit at [facility]. Other Home Health Orders/Instructions: - HYQMVHQ WOUND #24: - Abdomen - midline Wound Laterality: Distal Cleanser: Soap and Water 3 x Per Week/30 Days Discharge Instructions: May shower and wash wound with dial antibacterial soap and water prior to dressing change. Prim Dressing: Iodoform packing strip 1/4 (in) 3 x Per Week/30 Days ary Discharge Instructions: Lightly pack as instructed SEITH, AIKEY ([id number]) [id number] Page 7 of 9 Secondary Dressing: Zetuvit Plus Silicone Border Dressing 4x4 (in/in) 3 x Per Week/30 Days Discharge Instructions: Apply silicone border over primary dressing as directed. Add-Ons: Gloves, Large 3 x Per Week/30 Days 09/29/2022: Apparently the patient's home health providers have not been packing the wound. It now probes to a depth of 2.4 cm. It is clean without any slough or purulent drainage. No concern for infection. No debridement was necessary today. We are going to pack the wound with quarter inch iodoform packing strips. We will communicate with the patient's home health agency and discuss the need to completely pack the wound to facilitate wound healing. He will follow-up in 2 weeks. Electronic Signature(s) Signed: 09/29/2022 10:28:57 AM By: [name] Maudlin MD FACS Entered By: [name] on  09/29/2022 10:28:57 -------------------------------------------------------------------------------- HxROS Details Patient Name: Date of Service: [name], [name] 09/29/2022 10:00 A M Medical Record Number: [id number] Patient Account Number: [id number] Date of Birth/Sex: Treating RN: 15-Jun-1956 (67 y.o. M) Primary Care Provider: Cletis [name] Other Clinician: Referring Provider: Treating  Provider/Extender: [name] in Treatment: 6 Information Obtained From Patient Eyes Medical History: Negative for: Cataracts; Glaucoma; Optic Neuritis Ear/Nose/Mouth/Throat Medical History: Negative for: Chronic sinus problems/congestion; Middle ear problems Hematologic/Lymphatic Medical History: Negative for: Anemia; Hemophilia; Human Immunodeficiency Virus; Lymphedema; Sickle Cell Disease Respiratory Medical History: Positive for: Sleep Apnea Negative for: Aspiration; Asthma; Chronic Obstructive Pulmonary Disease (COPD); Pneumothorax; Tuberculosis Cardiovascular Medical History: Positive for: Congestive Heart Failure; Hypertension Negative for: Angina; Arrhythmia; Coronary Artery Disease; Deep Vein Thrombosis; Hypotension; Myocardial Infarction; Peripheral Arterial Disease; Peripheral Venous Disease; Phlebitis; Vasculitis Gastrointestinal Medical History: Negative for: Cirrhosis ; Colitis; Crohns; Hepatitis A; Hepatitis B; Hepatitis C Endocrine Medical History: Positive for: Type II Diabetes Negative for: Type I Diabetes Time with diabetes: [address] [place] ([id number]) [id number] Page 8 of 9 Treated with: Insulin Blood sugar tested every day: No Genitourinary Medical History: Negative for: End Stage Renal Disease Immunological Medical History: Negative for: Lupus Erythematosus; Raynauds; Scleroderma Integumentary (Skin) Medical History: Negative for: History of Burn Musculoskeletal Medical History: Positive for: Osteoarthritis Negative for: Gout; Rheumatoid Arthritis; Osteomyelitis Neurologic Medical History: Positive for: Neuropathy - Diabetic Negative for: Dementia; Quadriplegia; Paraplegia; Seizure Disorder Oncologic Medical History: Negative for: Received Chemotherapy; Received Radiation Psychiatric Medical History: Negative for: Anorexia/bulimia; Confinement Anxiety Immunizations Pneumococcal  Vaccine: Received Pneumococcal Vaccination: No Implantable Devices No devices added Family and Social History Cancer: Yes - Mother,Father; Diabetes: Yes - Mother,Father,Siblings; Heart Disease: Yes - Siblings; Hereditary Spherocytosis: No; Hypertension: No; Kidney Disease: No; Lung Disease: Yes - Mother; Seizures: No; Stroke: No; Thyroid Problems: No; Tuberculosis: No; Never smoker; Marital Status - Single; Alcohol Use: Never; Drug Use: No History; Caffeine Use: Daily - Coffee; Financial Concerns: No; Food, Clothing or Shelter Needs: No; Support System Lacking: No; Transportation Concerns: No Electronic Signature(s) Signed: 09/29/2022 10:30:34 AM By: [name] Maudlin MD FACS Entered By: [name] on 09/29/2022 10:25:16 -------------------------------------------------------------------------------- SuperBill Details Patient Name: Date of Service: [name], [name] 09/29/2022 Medical Record Number: [id number] Patient Account Number: [id number] Date of Birth/Sex: Treating RN: [birth year] (67 y.o. M) Primary Care Provider: Cletis [name] Other Clinician: Referring Provider: Treating Provider/Extender: [name] in Treatment: [place], [place] ([id number]) [phone].pdf Page 9 of 9 Diagnosis Coding ICD-10 Codes Code Description L98.499 Non-pressure chronic ulcer of skin of other sites with unspecified severity E66.2 Morbid (severe) obesity with alveolar hypoventilation I73.9 Peripheral vascular disease, unspecified W40.97 Chronic diastolic (congestive) heart failure E11.622 Type 2 diabetes mellitus with other skin ulcer Facility Procedures : CPT4 Code: 35329924 Description: 99213 - WOUND CARE VISIT-LEV 3 EST PT Modifier: Quantity: 1 Physician Procedures : CPT4 Code Description Modifier [id number] 99214 - WC PHYS LEVEL 4 - EST PT ICD-10 Diagnosis Description L98.499 Non-pressure chronic ulcer of skin of other sites with unspecified  severity E66.2 Morbid (severe) obesity with alveolar hypoventilation E11.622  Type 2 diabetes mellitus with other skin ulcer Q22.29 Chronic diastolic (congestive) heart failure Quantity: 1 Electronic Signature(s) Signed: 09/29/2022 11:14:46 AM By: [name] Maudlin MD FACS Signed: 09/29/2022 3:59:24 PM By: Adline Peals Previous Signature: 09/29/2022 10:29:17 AM Version By: [name] Maudlin MD FACS Entered By: Adline Peals on 09/29/2022 10:50:27

## 2022-09-30 NOTE — Progress Notes (Signed)
[name], [name] ([id number]) [id number] Page 1 of 8 Visit Report for 09/29/2022 Arrival Information Details Patient Name: Date of Service: [name], [name] 09/29/2022 10:00 A M Medical Record Number: [id number] Patient Account Number: [id number] Date of Birth/Sex: Treating RN: 04-29-1956 (67 y.o. [name] Primary Care Yamileth Hayse: [name] Other Clinician: Referring Delvon Chipps: Treating Amad Mau/Extender: [name] in Treatment: 6 Visit Information History Since Last Visit Added or deleted any medications: No Patient Arrived: Wheel Chair Any new allergies or adverse reactions: No Arrival Time: 10:02 Had a fall or experienced change in No Accompanied By: self activities of daily living that may affect Transfer Assistance: None risk of falls: Patient Identification Verified: Yes Signs or symptoms of abuse/neglect since last visito No Secondary Verification Process Completed: Yes Hospitalized since last visit: No Implantable device outside of the clinic excluding No cellular tissue based products placed in the center since last visit: Has Dressing in Place as Prescribed: Yes Pain Present Now: No Electronic Signature(s) Signed: 09/29/2022 3:59:24 PM By: Adline Peals Entered By: Adline Peals on 09/29/2022 10:02:46 -------------------------------------------------------------------------------- Clinic Level of Care Assessment Details Patient Name: Date of Service: [name], [name] 09/29/2022 10:00 A M Medical Record Number: [id number] Patient Account Number: [id number] Date of Birth/Sex: Treating RN: Apr 08, 1956 (67 y.o. [name] Primary Care Raeana Blinn: [name] Other Clinician: Referring Monque Haggar: Treating Jacqualine Weichel/Extender: [name] in Treatment: 6 Clinic Level of Care Assessment Items TOOL 4 Quantity Score X- 1 0 Use when only an EandM is performed on  FOLLOW-UP visit ASSESSMENTS - Nursing Assessment / Reassessment X- 1 10 Reassessment of Co-morbidities (includes updates in patient status) X- 1 5 Reassessment of Adherence to Treatment Plan ASSESSMENTS - Wound and Skin A ssessment / Reassessment X - Simple Wound Assessment / Reassessment - one wound 1 5 '[]'$  - 0 Complex Wound Assessment / Reassessment - multiple wounds '[]'$  - 0 Dermatologic / Skin Assessment (not related to wound area) ASSESSMENTS - Focused Assessment '[]'$  - 0 Circumferential Edema Measurements - multi extremities '[]'$  - 0 Nutritional Assessment / Counseling / Intervention [place], [name] ([id number]) [id number] Page 2 of 8 '[]'$  - 0 Lower Extremity Assessment (monofilament, tuning fork, pulses) '[]'$  - 0 Peripheral Arterial Disease Assessment (using hand held doppler) ASSESSMENTS - Ostomy and/or Continence Assessment and Care '[]'$  - 0 Incontinence Assessment and Management '[]'$  - 0 Ostomy Care Assessment and Management (repouching, etc.) PROCESS - Coordination of Care X - Simple Patient / Family Education for ongoing care 1 15 '[]'$  - 0 Complex (extensive) Patient / Family Education for ongoing care X- 1 10 Staff obtains Programmer, systems, Records, T Results / Process Orders est '[]'$  - 0 Staff telephones HHA, Nursing Homes / Clarify orders / etc '[]'$  - 0 Routine Transfer to another Facility (non-emergent condition) '[]'$  - 0 Routine Hospital Admission (non-emergent condition) '[]'$  - 0 New Admissions / Biomedical engineer / Ordering NPWT Apligraf, etc. , '[]'$  - 0 Emergency Hospital Admission (emergent condition) X- 1 10 Simple Discharge Coordination '[]'$  - 0 Complex (extensive) Discharge Coordination PROCESS - Special Needs '[]'$  - 0 Pediatric / Minor Patient Management '[]'$  - 0 Isolation Patient Management '[]'$  - 0 Hearing / Language / Visual special needs '[]'$  - 0 Assessment of Community assistance (transportation, D/C planning, etc.) '[]'$  - 0 Additional  assistance / Altered mentation '[]'$  - 0 Support Surface(s) Assessment (bed, cushion, seat, etc.) INTERVENTIONS - Wound Cleansing / Measurement X - Simple Wound Cleansing - one wound 1 5 '[]'$  - 0 Complex Wound Cleansing -  multiple wounds X- 1 5 Wound Imaging (photographs - any number of wounds) '[]'$  - 0 Wound Tracing (instead of photographs) X- 1 5 Simple Wound Measurement - one wound '[]'$  - 0 Complex Wound Measurement - multiple wounds INTERVENTIONS - Wound Dressings X - Small Wound Dressing one or multiple wounds 1 10 '[]'$  - 0 Medium Wound Dressing one or multiple wounds '[]'$  - 0 Large Wound Dressing one or multiple wounds '[]'$  - 0 Application of Medications - topical '[]'$  - 0 Application of Medications - injection INTERVENTIONS - Miscellaneous '[]'$  - 0 External ear exam '[]'$  - 0 Specimen Collection (cultures, biopsies, blood, body fluids, etc.) '[]'$  - 0 Specimen(s) / Culture(s) sent or taken to Lab for analysis '[]'$  - 0 Patient Transfer (multiple staff / Civil Service fast streamer / Similar devices) '[]'$  - 0 Simple Staple / Suture removal (25 or less) '[]'$  - 0 Complex Staple / Suture removal (26 or more) '[]'$  - 0 Hypo / Hyperglycemic Management (close monitor of Blood Glucose) [name], [name] ([id number]) [id number] Page 3 of 8 '[]'$  - 0 Ankle / Brachial Index (ABI) - do not check if billed separately X- 1 5 Vital Signs Has the patient been seen at the hospital within the last three years: Yes Total Score: 85 Level Of Care: New/Established - Level 3 Electronic Signature(s) Signed: 09/29/2022 3:59:24 PM By: Adline Peals Entered By: Adline Peals on 09/29/2022 10:50:17 -------------------------------------------------------------------------------- Encounter Discharge Information Details Patient Name: Date of Service: [name], [name] 09/29/2022 10:00 A M Medical Record Number: [id number] Patient Account Number: [id number] Date of Birth/Sex: Treating RN: March 09, 1956 (67 y.o. [name] Primary Care Donovin Kraemer: [name] Other Clinician: Referring Yeiren Whitecotton: Treating Alvena Kiernan/Extender: [name] in Treatment: 6 Encounter Discharge Information Items Discharge Condition: Stable Ambulatory Status: Wheelchair Discharge Destination: Home Transportation: Private Auto Accompanied By: self Schedule Follow-up Appointment: Yes Clinical Summary of Care: Patient Declined Electronic Signature(s) Signed: 09/29/2022 3:59:24 PM By: Adline Peals Entered By: Adline Peals on 09/29/2022 10:50:59 -------------------------------------------------------------------------------- Lower Extremity Assessment Details Patient Name: Date of Service: [name], [name] 09/29/2022 10:00 A M Medical Record Number: [id number] Patient Account Number: [id number] Date of Birth/Sex: Treating RN: April 21, 1956 (67 y.o. [name] Primary Care Cici Rodriges: [name] Other Clinician: Referring Deyani Hegarty: Treating Cyler Kappes/Extender: [name] in Treatment: 6 Electronic Signature(s) Signed: 09/29/2022 3:59:24 PM By: Adline Peals Entered By: Adline Peals on 09/29/2022 10:03:16 -------------------------------------------------------------------------------- Multi Wound Chart Details Patient Name: Date of Service: [name], [name] 09/29/2022 10:00 A M Medical Record Number: [id number] Patient Account Number: [id number] [place], [place] ([id number]) [id number] Page 4 of 8 Date of Birth/Sex: Treating RN: 05/07/1956 (67 y.o. M) Primary Care Serita Degroote: Other Clinician: [name] Referring Pixie Burgener: Treating Ermina Oberman/Extender: [name] in Treatment: 6 Vital Signs Height(in): 72 Pulse(bpm): 26 Weight(lbs): 400 Blood Pressure(mmHg): 108/69 Body Mass Index(BMI): 54.2 Temperature(F): 98.2 Respiratory Rate(breaths/min): 18 [24:Photos:]  [N/A:N/A] Distal Abdomen - midline N/A N/A Wound Location: Gradually Appeared N/A N/A Wounding Event: Lesion N/A N/A Primary Etiology: Sleep Apnea, Congestive Heart N/A N/A Comorbid History: Failure, Hypertension, Type II Diabetes, Osteoarthritis, Neuropathy 08/29/2022 N/A N/A Date Acquired: 4 N/A N/A Weeks of Treatment: Open N/A N/A Wound Status: No N/A N/A Wound Recurrence: 0.5x0.4x2.4 N/A N/A Measurements L x W x D (cm) 0.157 N/A N/A A (cm) : rea 0.377 N/A N/A Volume (cm) : -406.50% N/A N/A % Reduction in Area: -121.80% N/A N/A % Reduction in Volume: Full Thickness Without Exposed N/A N/A Classification: Support Structures Medium N/A N/A Exudate Amount:  Serosanguineous N/A N/A Exudate Type: red, brown N/A N/A Exudate Color: Distinct, outline attached N/A N/A Wound Margin: Large (67-100%) N/A N/A Granulation Amount: Red N/A N/A Granulation Quality: Small (1-33%) N/A N/A Necrotic Amount: Fat Layer (Subcutaneous Tissue): Yes N/A N/A Exposed Structures: Fascia: No Tendon: No Muscle: No Joint: No Bone: No None N/A N/A Epithelialization: No Abnormalities Noted N/A N/A Periwound Skin Texture: No Abnormalities Noted N/A N/A Periwound Skin Moisture: No Abnormalities Noted N/A N/A Periwound Skin Color: No Abnormality N/A N/A Temperature: Treatment Notes Electronic Signature(s) Signed: 09/29/2022 10:24:02 AM By: Fredirick Maudlin MD FACS Entered By: Fredirick Maudlin on 09/29/2022 10:24:01 -------------------------------------------------------------------------------- Multi-Disciplinary Care Plan Details Patient Name: Date of Service: [name], [name] 09/29/2022 10:00 A M Medical Record Number: [id number] Patient Account Number: [id number] [name], [name] ([id number]) [id number] Page 5 of 8 Date of Birth/Sex: Treating RN: [birth year] (67 y.o. [name] Primary Care Culver Feighner: Other Clinician: [name] Referring  Val Schiavo: Treating Chalon Zobrist/Extender: [name] in Treatment: 6 Active Inactive Orientation to the Wound Care Program Nursing Diagnoses: Knowledge deficit related to the wound healing center program Goals: Patient/caregiver will verbalize understanding of the [facility] Program Date Initiated: 08/18/2022 Target Resolution Date: 10/21/2022 Goal Status: Active Interventions: Provide education on orientation to the wound center Notes: Wound/Skin Impairment Nursing Diagnoses: Knowledge deficit related to smoking impact on wound healing Goals: Ulcer/skin breakdown will have a volume reduction of 30% by week 4 Date Initiated: 08/18/2022 Target Resolution Date: 10/21/2022 Goal Status: Active Interventions: Provide education on ulcer and skin care Treatment Activities: Skin care regimen initiated : 08/18/2022 Notes: Electronic Signature(s) Signed: 09/29/2022 3:59:24 PM By: Adline Peals Entered By: Adline Peals on 09/29/2022 10:08:45 -------------------------------------------------------------------------------- Pain Assessment Details Patient Name: Date of Service: [name], [name] 09/29/2022 10:00 A M Medical Record Number: [id number] Patient Account Number: [id number] Date of Birth/Sex: Treating RN: Jan 17, 1956 (67 y.o. [name] Primary Care Clemon Devaul: [name] Other Clinician: Referring Benen Weida: Treating Nakeda Lebron/Extender: [name] in Treatment: 6 Active Problems Location of Pain Severity and Description of Pain Patient Has Paino No Site Locations Rate the pain. [name], Laith ([id number]) [id number] Page 6 of 8 Rate the pain. Current Pain Level: 0 Pain Management and Medication Current Pain Management: Electronic Signature(s) Signed: 09/29/2022 3:59:24 PM By: Adline Peals Entered By: Adline Peals on 09/29/2022  10:03:13 -------------------------------------------------------------------------------- Patient/Caregiver Education Details Patient Name: Date of Service: [name], [name] 1/11/2024andnbsp10:00 [place] Record Number: [id number] Patient Account Number: [id number] Date of Birth/Gender: Treating RN: 03-19-1956 (67 y.o. [name] Primary Care Physician: [name] Other Clinician: Referring Physician: Treating Physician/Extender: [name] in Treatment: 6 Education Assessment Education Provided To: Patient Education Topics Provided Wound/Skin Impairment: Methods: Explain/Verbal Responses: Reinforcements needed, State content correctly Electronic Signature(s) Signed: 09/29/2022 3:59:24 PM By: Adline Peals Entered By: Adline Peals on 09/29/2022 10:08:57 -------------------------------------------------------------------------------- Wound Assessment Details Patient Name: Date of Service: [name], [name] 09/29/2022 10:00 A M Medical Record Number: [id number] Patient Account Number: [id number] Date of Birth/Sex: Treating RN: 08/25/1956 (67 y.o. [name] Primary Care Eilan Mcinerny: [name] Other Clinician: Referring Alisandra Son: Treating Shevelle Smither/Extender: Jake Michaelis [place], [place] ([id number]) [phone].pdf Page 7 of 8 Weeks in Treatment: 6 Wound Status Wound Number: 24 Primary Lesion Etiology: Wound Location: Distal Abdomen - midline Wound Open Wounding Event: Gradually Appeared Status: Date Acquired: 08/29/2022 Comorbid Sleep Apnea, Congestive Heart Failure, Hypertension, Type II Weeks Of Treatment: 4 History: Diabetes, Osteoarthritis, Neuropathy Clustered Wound: No Photos Wound Measurements Length: (  cm) 0.5 Width: (cm) 0.4 Depth: (cm) 2.4 Area: (cm) 0.157 Volume: (cm) 0.377 % Reduction in Area: -406.5% % Reduction in Volume: -121.8% Epithelialization:  None Tunneling: No Undermining: No Wound Description Classification: Full Thickness Without Exposed Support Structures Wound Margin: Distinct, outline attached Exudate Amount: Medium Exudate Type: Serosanguineous Exudate Color: red, brown Foul Odor After Cleansing: No Slough/Fibrino Yes Wound Bed Granulation Amount: Large (67-100%) Exposed Structure Granulation Quality: Red Fascia Exposed: No Necrotic Amount: Small (1-33%) Fat Layer (Subcutaneous Tissue) Exposed: Yes Necrotic Quality: Adherent Slough Tendon Exposed: No Muscle Exposed: No Joint Exposed: No Bone Exposed: No Periwound Skin Texture Texture Color No Abnormalities Noted: Yes No Abnormalities Noted: Yes Moisture Temperature / Pain No Abnormalities Noted: Yes Temperature: No Abnormality Treatment Notes Wound #24 (Abdomen - midline) Wound Laterality: Distal Cleanser Soap and Water Discharge Instruction: May shower and wash wound with dial antibacterial soap and water prior to dressing change. Peri-Wound Care Topical Primary Dressing Iodoform packing strip 1/4 (in) Discharge Instruction: Lightly pack as instructed Secondary Dressing Zetuvit Plus Silicone Border Dressing 4x4 (in/in) Discharge Instruction: Apply silicone border over primary dressing as directed. [name], [place] ([id number]) [id number] Page 8 of 8 Secured With Compression Wrap Compression Stockings Add-Ons Gloves, Art gallery manager) Signed: 09/29/2022 3:59:24 PM By: Adline Peals Entered By: Adline Peals on 09/29/2022 10:07:33 -------------------------------------------------------------------------------- Vitals Details Patient Name: Date of Service: [name], [name] 09/29/2022 10:00 A M Medical Record Number: [id number] Patient Account Number: [id number] Date of Birth/Sex: Treating RN: 1955/10/22 (66 y.o. [name] Primary Care Aizah Gehlhausen: [name] Other Clinician: Referring  Issaiah Seabrooks: Treating Governor Matos/Extender: [name] in Treatment: 6 Vital Signs Time Taken: 10:02 Temperature (F): 98.2 Height (in): 72 Pulse (bpm): 63 Weight (lbs): 400 Respiratory Rate (breaths/min): 18 Body Mass Index (BMI): 54.2 Blood Pressure (mmHg): 108/69 Reference Range: 80 - 120 mg / dl Electronic Signature(s) Signed: 09/29/2022 3:59:24 PM By: Adline Peals Entered By: Adline Peals on 09/29/2022 10:03:08

## 2022-10-13 ENCOUNTER — Ambulatory Visit (HOSPITAL_BASED_OUTPATIENT_CLINIC_OR_DEPARTMENT_OTHER): Payer: Medicare PPO | Admitting: General Surgery

## 2022-10-14 ENCOUNTER — Encounter (INDEPENDENT_AMBULATORY_CARE_PROVIDER_SITE_OTHER): Payer: Medicare PPO | Admitting: Cardiology

## 2022-10-14 DIAGNOSIS — G4733 Obstructive sleep apnea (adult) (pediatric): Secondary | ICD-10-CM

## 2022-10-15 ENCOUNTER — Ambulatory Visit: Payer: Medicare PPO | Attending: Cardiology

## 2022-10-15 DIAGNOSIS — I5032 Chronic diastolic (congestive) heart failure: Secondary | ICD-10-CM

## 2022-10-15 DIAGNOSIS — G4733 Obstructive sleep apnea (adult) (pediatric): Secondary | ICD-10-CM

## 2022-10-15 NOTE — Procedures (Signed)
Patient Information Study Date: 10/14/2022 Patient Name: [name] Patient ID: [id number] Birth Date: 07-26-1956 Age: 67 Gender: Male BMI: 55.2 (W=416 lb, H=6' 1'') Stopbang: 5 Referring Physician: Allena Katz, NP  TEST DESCRIPTION: Home sleep apnea testing was completed using the WatchPat, a Type 1 device, utilizing peripheral arterial tonometry (PAT), chest movement, actigraphy, pulse oximetry, pulse rate, body position and snore.  AHI was calculated with apnea and hypopnea using valid sleep time as the denominator. RDI includes apneas, hypopneas, and RERAs.  The data acquired and the scoring of sleep and all associated events were performed in accordance with the recommended standards and specifications as outlined in the AASM Manual for the Scoring of Sleep and Associated Events 2.2.0 (2015).  FINDINGS:  1.  Severe Obstructive Sleep Apnea with AHI 36.7/hr.   2.  Mild Central Sleep Apnea with pAHIc 9.7/hr.  3.  Oxygen desaturations as low as 74%.  4.  Mild to moderate snoring was present. O2 sats were < 88% for 63.6 min.  5.  Total sleep time was 5 hrs and 38 min.  6.  9.5% of total sleep time was spent in REM sleep.   7.  Normal sleep onset latency at 11 min.   8.  Prolonged REM sleep onset latency at 182 min.   9.  Total awakenings were 32.  10. Arrhythmia detection:  None  DIAGNOSIS:   Severe Obstructive Sleep Apnea (G47.33) Nocturnal Hypoxemia  RECOMMENDATIONS:   1.  Clinical correlation of these findings is necessary.  The decision to treat obstructive sleep apnea (OSA) is usually based on the presence of apnea symptoms or the presence of associated medical conditions such as Hypertension, Congestive Heart Failure, Atrial Fibrillation or Obesity.  The most common symptoms of OSA are snoring, gasping for breath while sleeping, daytime sleepiness and fatigue.   2.  Initiating apnea therapy is recommended given the presence of symptoms and/or associated conditions.  Recommend proceeding with one of the following:     a.  Auto-CPAP therapy with a pressure range of 5-20cm H2O.     b.  An oral appliance (OA) that can be obtained from certain dentists with expertise in sleep medicine.  These are primarily of use in non-obese patients with mild and moderate disease.     c.  An ENT consultation which may be useful to look for specific causes of obstruction and possible treatment options.     d.  If patient is intolerant to PAP therapy, consider referral to ENT for evaluation for hypoglossal nerve stimulator.   3.  Close follow-up is necessary to ensure success with CPAP or oral appliance therapy for maximum benefit.  4.  A follow-up oximetry study on CPAP is recommended to assess the adequacy of therapy and determine the need for supplemental oxygen or the potential need for Bi-level therapy.  An arterial blood gas to determine the adequacy of baseline ventilation and oxygenation should also be considered.  5.  Healthy sleep recommendations include:  adequate nightly sleep (normal 7-9 hrs/night), avoidance of caffeine after noon and alcohol near bedtime, and maintaining a sleep environment that is cool, dark and quiet.  6.  Weight loss for overweight patients is recommended.  Even modest amounts of weight loss can significantly improve the severity of sleep apnea.  7.  Snoring recommendations include:  weight loss where appropriate, side sleeping, and avoidance of alcohol before bed.  8.  Operation of motor vehicle should be avoided when sleepy.  Signature: Fransico Him, MD;  [facility]; [place], [employer] Electronically Signed: 10/15/2022

## 2022-10-18 ENCOUNTER — Telehealth: Payer: Self-pay | Admitting: *Deleted

## 2022-10-18 NOTE — Telephone Encounter (Signed)
Message left to return a call to discuss sleep study results and recommendations. 

## 2022-10-19 ENCOUNTER — Ambulatory Visit: Payer: Medicare PPO | Attending: Nurse Practitioner

## 2022-10-19 DIAGNOSIS — M6281 Muscle weakness (generalized): Secondary | ICD-10-CM

## 2022-10-19 DIAGNOSIS — R2689 Other abnormalities of gait and mobility: Secondary | ICD-10-CM | POA: Diagnosis not present

## 2022-10-19 NOTE — Therapy (Addendum)
OUTPATIENT PHYSICAL THERAPY WHEELCHAIR EVALUATION   Patient Name: [name] MRN: [id number] DOB:Feb 16, 1956, 67 y.o., male Today's Date: 10/19/2022  END OF SESSION:  PT End of Session - 10/19/22 1358     Visit Number 1    Number of Visits 1    PT Start Time [id number]    PT Stop Time 1440    PT Time Calculation (min) 45 min    Equipment Utilized During Treatment Gait belt    Activity Tolerance Patient tolerated treatment well    Behavior During Therapy WFL for tasks assessed/performed             Past Medical History:  Diagnosis Date   Anemia    iron   CHF (congestive heart failure) ([place])    Presumed diastolic. Echo (06/07) w.EF 45%, severe posterior HK, mild LV hypertrophy, No further work-up of abnormal echo was done. pt denies CHF   Colon cancer ([place]) 2007   s/p sigmoid colectomy   Diabetes mellitus    Has Hx of diabetic foot ulcer & peripheral neuropathy  type2   Dyspnea    w/ activity   History of PFTs 05/2009   Mild Obstructive defect   HTN (hypertension)    Hyperlipidemia    Morbid obesity (HCC)    OSA on CPAP    Pneumonia    week ago    Past Surgical History:  Procedure Laterality Date   BARIATRIC SURGERY  12/2009   Lap Band/At [facility]   COLONOSCOPY  multiple   COLONOSCOPY WITH PROPOFOL N/A 11/26/2012   Procedure: COLONOSCOPY WITH PROPOFOL;  Surgeon: Gatha Mayer, MD;  Location: [facility];  Service: Endoscopy;  Laterality: N/A;  may need pre appt. with anesthesia due to morbid obesity   COLONOSCOPY WITH PROPOFOL N/A 07/11/2017   Procedure: COLONOSCOPY WITH PROPOFOL;  Surgeon: Gatha Mayer, MD;  Location: [facility];  Service: Endoscopy;  Laterality: N/A;   [place]   '80/Right  '84/Left   EYE SURGERY Right 05/2017   HERNIA REPAIR     HIP SURGERY     bilateral   INCISIONAL HERNIA REPAIR N/A 12/18/2013   Procedure:  REPAIR OF INCARCERATED INCISIONAL HERNIA;  Surgeon: Rolm Bookbinder, MD;  Location: [place];  Service:  General;  Laterality: N/A;   INSERTION OF MESH N/A 12/18/2013   Procedure: INSERTION OF MESH;  Surgeon: Rolm Bookbinder, MD;  Location: [place];  Service: General;  Laterality: N/A;   Sigmoid Colectomy  10/2005   [place]   TONSILLECTOMY     Patient Active Problem List   Diagnosis Date Noted   Gout attack 11/27/2021   Hypocalcemia [id number]   Acute metabolic encephalopathy [id number]   Acute respiratory failure with hypoxia and hypercapnia ([place]) 11/25/2021   Stage 3b chronic kidney disease (CKD) ([place]) 11/25/2021   Syncope 11/25/2021   Hypoglycemia 11/24/2021   Fall (on)(from) sidewalk curb, initial encounter 11/24/2021   Chronic respiratory failure with hypoxia ([place]) 11/24/2021   Hyperkalemia 09/28/2021   Dependence on supplemental oxygen 09/19/2021   Long term (current) use of insulin ([place]) 09/19/2021   Opioid abuse, uncomplicated ([place]) [id number]   Type 2 diabetes mellitus with diabetic peripheral angiopathy without gangrene ([place]) 09/19/2021   Insulin-requiring or dependent type II diabetes mellitus ([place]) 07/27/2021   Encounter for general adult medical examination with abnormal findings 04/06/2021   Calculus of gallbladder without cholecystitis without obstruction 04/06/2021   Chronic idiopathic constipation 11/09/2020   PAD (peripheral artery disease) ([place]) 07/24/2020  Ulcer of left foot, limited to breakdown of skin ([place]) 07/23/2020   Drug-induced erectile dysfunction 05/22/2019   Onychomycosis of toenail 05/21/2019   Class 3 obesity with alveolar hypoventilation, serious comorbidity, and body mass index (BMI) of 50.0 to 59.9 in adult ([place]) 02/07/2019   Ventral hernia without obstruction or gangrene 05/31/2018   Therapeutic opioid-induced constipation ([facility]) 05/31/2018   Long-term current use of opiate analgesic 02/15/2018   Left lumbar radiculopathy 01/29/2018   B12 deficiency 10/17/2017   Bleeding internal hemorrhoids    Insomnia 06/30/2017   Left eye affected by  proliferative diabetic retinopathy with traction retinal detachment not involving macula, associated with type 2 diabetes mellitus ([place]) 04/20/2017   Right eye affected by proliferative diabetic retinopathy with traction retinal detachment involving macula, associated with type 2 diabetes mellitus ([place]) 04/20/2017   Vitamin D deficiency 12/23/2016   Thiamine deficiency 08/02/2016   Primary osteoarthritis involving multiple joints 08/03/2015   Diabetes mellitus type II, uncontrolled 05/23/2014   Herpesviral keratitis 05/23/2014   Diabetes mellitus due to underlying condition with both eyes affected by proliferative retinopathy without macular edema, with long-term current use of insulin ([place]) 05/23/2014   Diabetic foot ulcer 02/05/2014   Iron deficiency anemia 01/09/2014   Osteoarthritis of hip 05/08/2013   Diabetic nephropathy ([place]) 05/07/2013   Bariatric surgery status 01/29/2013   Benign prostatic hyperplasia without lower urinary tract symptoms 01/11/2013   Hypothyroidism 01/11/2013   Colon cancer ([place]) 10/04/2012   Chronic diastolic CHF (congestive heart failure) ([place]) 04/13/2012   Diabetic peripheral neuropathy ([place]) 05/29/2008   OSA (obstructive sleep apnea) 10/01/2007   CARDIOMYOPATHY 10/01/2007   Hyperlipidemia with target LDL less than 100 07/24/2007   Essential hypertension 07/24/2007    PCP: [name], NP  REFERRING PROVIDER: [name], NP  THERAPY DIAG:  Other abnormalities of gait and mobility  Muscle weakness (generalized)  Rationale for Evaluation and Treatment Rehabilitation  SUBJECTIVE:                                                                                                                                                                                           SUBJECTIVE STATEMENT: Pt present or wheelchair evaluation. Pt reports that he has aide that comes and helps him out with cooking cleaning, bathing etc. She comes for 2 hours. He  is only able to do wheelchair to bed transfer which he does twice a day. He hasn't walked in a long time.  PRECAUTIONS: Fall  WEIGHT BEARING RESTRICTIONS No    OCCUPATION: diability  PLOF:  Requires assistive device for independence, Needs assistance with ADLs, Needs assistance with homemaking, Needs assistance  with gait, and Needs assistance with transfers  PATIENT GOALS: get a replacement power chair         MEDICAL HISTORY:  Primary diagnosis onset:  Diagnosis  Code: M15.0 (ICD-10-CM) - Primary generalized (osteo)arthritis Diagnosis:    Diagnosis code:       Diagnosis:   R60.9 (ICD-10-CM) - Edema, unspecified  Diagnosis  Code: E11.40 (ICD-10-CM) - Type 2 diabetes mellitus with diabetic neuropathy, unspecified Diagnosis:   []$ Progressive disease  Relevant future surgeries:     Height: 6' 1"$  Weight: 398 lbs Explain recent changes or trends in weight:    Currently losing weight which is being monitored by MD  History:  Past Medical History:  Diagnosis Date   Anemia    iron   CHF (congestive heart failure) ([place])    Presumed diastolic. Echo (06/07) w.EF 45%, severe posterior HK, mild LV hypertrophy, No further work-up of abnormal echo was done. pt denies CHF   Colon cancer ([place]) 2007   s/p sigmoid colectomy   Diabetes mellitus    Has Hx of diabetic foot ulcer & peripheral neuropathy  type2   Dyspnea    w/ activity   History of PFTs 05/2009   Mild Obstructive defect   HTN (hypertension)    Hyperlipidemia    Morbid obesity (HCC)    OSA on CPAP    Pneumonia    week ago             Cardio Status:  Functional Limitations:   [x]$ Intact  []$  Impaired      Respiratory Status:  Functional Limitations:   [x]$ Intact  []$ Impaired   []$ SOB []$ COPD []$ O2 Dependent ______LPM  []$ Ventilator Dependent  Resp equip:                                                     Objective Measure(s):   Orthotics:   []$ Amputee:                                                             []$ Prosthesis:         HOME ENVIRONMENT:  [x]$ House []$ Condo/town home []$ Apartment []$ Asst living []$ LTCF         []$ Own  []$ Rent   [x]$ Lives alone []$ Lives with others -                             Hours without assistance: 22  [x]$ Home is accessible to patient                                 Storage of wheelchair:  [x]$ In home   []$ Other Comments:        COMMUNITY :  TRANSPORTATION:  []$ Car []$ Van [x]$ Public Transportation []$ Adapted w/c Lift []$  Ambulance []$ Other:                     []$ Sits in wheelchair during transport   Where is w/c stored during transport?  []$ [place]  []$  [employer] examiner  r   []$ Self-Driver  Drive while in  Wheelchair []$ yes []$ no   Employment and/or school:  Specific requirements pertaining to mobility        Other:  COMMUNICATION:  Verbal Communication  [x]$ WFL []$ receptive []$ WFL []$ expressive []$ Understandable  []$ Difficult to understand  []$ non-communicative  Primary Language:__English____________ 2nd:_____________  Communication provided by:[x]$ Patient []$ Family []$ Caregiver []$ Translator   []$ Uses an augmentative communication device     Manufacturer/Model :                                                                MOBILITY/BALANCE:  Sitting Balance  Standing Balance  Transfers  Ambulation   [x]$ WFL      []$ WFL  []$ Independent  []$  Independent   []$ Uses UE for balance in sitting Comments:  []$ Uses UE/device for stability Comments:  [x]$  Min assist  []$  Ambulates independently with       device:___________________      []$  Mod assist  []$  Able to ambulate ______ feet        safely/functionally/independently   []$  Min assist  []$  Min assist  []$  Max assist  []$  Non-functional ambulator         History/High risk of falls   []$  Mod assist  [x]$  Mod assist  []$  Dependent  [x]$  Unable to ambulate   []$  Max  assist  []$  Max assist  Transfer method:[]$ 1 person []$ 2 person []$ sliding board []$ squat pivot []$ stand pivot []$ mechanical patient lift  []$ other:   []$  Unable  []$  Unable    Fall History: # of falls in the past 6  months? 7 (last fall 2 weeks ago) # of "near" falls in the past 6 months?     CURRENT SEATING / MOBILITY:  Current Mobility Device: []$ None []$ Cane/Walker []$ Manual []$ Dependent []$ Dependent w/ Tilt rScooter  [x]$ Power (type of control):   Manufacturer:  Model:  Serial #:   Size:  Color:  Age:   Purchased by whom:   Current condition of mobility base:    Current seating system:                                                                       Age of seating system:    Describe posture in present seating system:    Is the current mobility meeting medical necessity?:  []$ Yes [x]$ No Describe:worn parts, no tread on tires, battery and motor issues                                     Ability to complete Mobility-Related Activities of Daily Living (MRADL's) with Current Mobility Device:   Move room to room  [x]$ Independent  []$ Min []$ Mod []$ Max assist  []$ Unable  Comments:   Meal prep  [x]$ Independent  []$ Min []$ Mod []$ Max assist  []$ Unable    Feeding  [x]$ Independent  []$ Min []$ Mod []$ Max assist  []$ Unable    Bathing  []$ Independent  []$ Min []$ Mod []$ Max assist  [x]$ Unable    Grooming  [x]$ Independent  []$ Min []$ Mod []$ Max assist  []$ Unable  UE dressing  []$ Independent  [x]$ Min []$ Mod []$ Max assist  []$ Unable    LE dressing  []$ Independent   [x]$ Min []$ Mod []$ Max assist  []$ Unable    Toileting  []$ Independent  [x]$ Min []$ Mod []$ Max assist  []$ Unable    Bowel Mgt: [x]$  Continent []$  Incontinent []$  Accidents []$  Diapers []$  Colostomy []$  Bowel Program:  Bladder Mgt: [x]$  Continent []$  Incontinent []$  Accidents []$  Diapers []$  Urinal []$  Intermittent Cath []$  Indwelling Cath []$  Supra-pubic Cath     Current Mobility Equipment Trialed/ Ruled Out:    Does not meet mobility needs due to:    Mark all boxes that indicate inability to use the specific equipment listed     Meets needs for safe  independent functional  ambulation  / mobility    Risk of  Falling or History of Falls    Enviromental limitations      Cognition    Safety concerns  with  physical ability    Decreased / limitations endurance  & strength     Decreased / limitations  motor skills  & coordination    Pain    Pace /  Speed    Cardiac and/or  respiratory condition    Contra - indicated by diagnosis   Cane/Crutches  []$   []$   []$   []$   []$   []$   []$   []$   []$   []$   [x]$    Walker / Rollator  []$  NA   []$   [x]$   []$   []$   []$   []$   []$   [x]$   []$   []$   []$     Manual Wheelchair [id number]:  []$  NA  []$   []$   []$   []$   []$   []$   []$   []$   []$   []$   [x]$    Manual W/C ([id number]) with power assist  []$  NA  []$   []$   []$   []$   []$   []$   []$   []$   []$   []$   []$    Scooter  []$  NA  []$   []$   []$   []$   []$   []$   []$   []$   []$   []$   []$    Power Wheelchair: standard joystick  []$  NA  [x]$   []$   []$   []$   []$   []$   []$   []$   []$   []$   []$    Power Wheelchair: alternative controls  []$  NA  []$   []$   []$   []$   []$   []$   []$   []$   []$   []$   []$    Summary:  The least costly alternative for independent functional mobility was found to be:    []$  Crutch/Cane  []$  Walker []$  Manual w/c  []$  Manual w/c with power assist   []$  Scooter   [x]$  Power w/c std joystick   []$  Power w/c alternative control        []$  Requires dependent care mobility device   Media planner for [employer] skills are adequate for safe mobility equipment operation  [x]$   Yes []$   No  Patient is willing and motivated to use recommended mobility equipment  [x]$   Yes []$   No       []$  Patient is unable to safely operate mobility equipment independently and requires dependent care equipment Comments:           SENSATION and SKIN ISSUES:  Sensation [x]$  Intact  []$  Impaired []$  Absent []$  Hyposensate []$  Hypersensate  []$  Defensiveness  Location(s) of impairment:    Pressure Relief Method(s):  [x]$  Lean side to side to offload (  without risk of falling)  [x]$   W/C push up (4+ times/hour for 15+ seconds) []$  Stand up (without risk of falling)    []$  Other: (Describe): Effective pressure relief method(s) above can be performed consistently throughout the day: rYes  r  No If not, Why?:  Skin Integrity Risk:       [x]$  Low risk           []$  Moderate risk            []$  High risk  If high risk, explain:   Skin Issues/Skin Integrity  Current skin Issues  [x]$  Yes []$  No []$  Intact  [x]$   Red area   []$   Open area  []$  Scar tissue  [x]$  At risk from prolonged sitting  Where: History of Skin Issues  [x]$  Yes []$  No Where : sacrum When: 2023 Stage: Hx of skin flap surgeries  []$  Yes [x]$  No Where:  When:  Pain: [x]$  Yes []$  No   Pain Location(s): hands/fingers, wrists, back, gout Intensity scale: (0-10) : 9/10 How does pain interfere with mobility and/or MRADLs? - dififculty with holding on to walker with standing/walking, difficulty with walking/standing while performing functional activities.        MAT EVALUATION:  Neuro-Muscular Status: (Tone, Reflexive, Responses, etc.)     [x]$   Intact   []$  Spasticity:  []$  Hypotonicity  []$  Fluctuating  []$  Muscle Spasms  []$  Poor Righting Reactions/Poor Equilibrium Reactions  []$  Primal Reflex(s):    Comments:            COMMENTS:    POSTURE:     Comments:  Pelvis Anterior/Posterior:  [x]$  Neutral   []$  Posterior  []$  Anterior  []$  Fixed - No movement []$  Tendency away from neutral [x]$  Flexible [x]$  Self-correction []$  External correction Obliquity (viewed from front)  [x]$  WFL []$  R Obliquity []$  L Obliquity  []$  Fixed - No movement []$  Tendency away from neutral [x]$  Flexible [x]$  Self-correction []$  External correction Rotation  [x]$  WFL []$  R anterior []$  L anterior  []$  Fixed - No movement []$  Tendency away from neutral [x]$  Flexible [x]$  Self-correction []$  External correction Tonal Influence Pelvis:  [x]$  Normal []$  Flaccid []$  Low tone []$  Spasticity []$  Dystonia []$  Pelvis thrust []$  Other:    Trunk Anterior/Posterior:  [x]$  WFL []$  Thoracic kyphosis []$  Lumbar lordosis  []$  Fixed - No movement []$  Tendency away from neutral []$  Flexible []$  Self-correction []$  External correction  [x]$  WFL []$  Convex  to left  []$  Convex to right []$  S-curve   []$  C-curve []$  Multiple curves []$  Tendency away from neutral [x]$  Flexible [x]$  Self-correction []$  External correction Rotation of shoulders and upper trunk:  [x]$  Neutral []$  Left-anterior []$  Right- anterior []$  Fixed- no movement []$  Tendency away from neutral [x]$  Flexible [x]$  Self correction []$  External correction Tonal influence Trunk:  [x]$  Normal []$  Flaccid []$  Low tone []$  Spasticity []$  Dystonia []$  Other:   Head & Neck  [x]$  Functional []$  Flexed    []$  Extended []$  Rotated right  []$  Rotated left []$  Laterally flexed right []$  Laterally flexed left []$  Cervical hyperextension   [x]$  Good head control []$  Adequate head control []$  Limited head control []$  Absent head control Describe tone/movement of head and neck:      Lower Extremity Measurements:  LE MMT:  MMT Right 10/19/2022 Left 10/19/2022  Hip flexion 3+ 3+  Hip extension    Hip abduction 3+ 3+  Hip adduction    Knee flexion 3+ 4  Knee extension 4 4  Ankle dorsiflexion 3+ 3+  Ankle plantarflexion     (Blank rows = not tested)  Hip positions:  [x]$  Neutral   []$  Abducted   []$  Adducted  []$  Subluxed   []$  Dislocated   []$  Fixed   []$  Tendency away from neutral [x]$  Flexible [x]$  Self-correction []$  External correction   Hip Windswept:[x]$  Neutral  []$  Right    []$  Left  []$  Subluxed   []$  Dislocated   []$  Fixed   []$  Tendency away from neutral [x]$  Flexible [x]$  Self-correction []$  External correction  LE Tone: [x]$  Normal []$  Low tone []$  Spasticity []$  Flaccid []$  Dystonia []$  Rocks/Extends at hip []$  Thrust into knee extension []$  Pushes legs downward into footrest  Foot positioning: ROM Concerns: Dorsiflexed: []$  Right   []$  Left Plantar flexed: []$  Right    []$  Left Inversion: []$  Right    []$  Left Eversion: []$  Right    []$  Left  LE Edema: []$  1+ (Barely detectable impression when finger is pressed into skin) [x]$  2+ (slight indentation. 15 seconds to rebound) []$  3+ (deeper  indentation. 30 seconds to rebound) []$  4+ (>30 seconds to rebound)  UE Measurements:  UPPER EXTREMITY ROM: WNL  Active ROM Right 10/19/2022 Left 10/19/2022  Shoulder flexion 115 115  Shoulder abduction 90 90  Shoulder adduction    Elbow flexion    Elbow extension    Wrist flexion    Wrist extension    (Blank rows = not tested)  UPPER EXTREMITY MMT:  MMT Right 10/19/2022 Left 10/19/2022  Shoulder flexion 4 4  Shoulder abduction 4 4  Shoulder adduction    Elbow flexion 5 5  Elbow extension 5 5  Wrist flexion    Wrist extension    Pinch strength    Grip strength 45 lbs 35 lbs  (Blank rows = not tested)  Shoulder Posture:  Right Tendency towards Left  [x]$   Functional [x]$    []$   Elevation []$    []$   Depression []$    []$   Protraction []$    []$   Retraction []$    []$   Internal rotation []$    []$   External rotation []$    []$   Subluxed []$     UE Tone: [x]$  Normal []$  Flaccid []$  Low tone []$  Spasticity  []$  Dystonia []$  Other:   UE Edema: [x]$  1+ (Barely detectable impression when finger is pressed into skin) []$  2+ (slight indentation. 15 seconds to rebound) []$  3+ (deeper indentation. 30 seconds to rebound) []$  4+ (>30 seconds to rebound)  Wrist/Hand: Handedness: [x]$  Right   []$  Left   []$  NA: Comments:  Right  Left  [x]$   WNL [x]$    []$   Limitations []$    []$   Contractures []$    []$   Fisting []$    []$   Tremors []$    []$   Weak grasp []$    []$   Poor dexterity []$    []$   Hand movement non functional []$    []$   Paralysis []$         MOBILITY BASE RECOMMENDATIONS and JUSTIFICATION:  MOBILITY BASE  JUSTIFICATION   Manufacturer:   Quantum  Model:              Q6 HD                Color:  Seat Width:  24" Seat Depth 24"   []$  Manual mobility base (continue below)   []$  Scooter/POV  [x]$  Power mobility base   Number of hours per day spent in above  selected mobility base: 18+  Typical daily mobility base use Schedule: during the day   [x]$  is not a safe, functional ambulator  [x]$  limitation  prevents from completing a MRADL(s) within a reasonable time frame    [x]$  limitation places at high risk of morbidity or mortality secondary to  the attempts to perform a    MRADL(s)  [x]$  limitation prevents accomplishing a MRADL(s) entirely  [x]$  provide independent mobility  [x]$  equipment is a lifetime medical need  [x]$  walker or cane inadequate  [x]$  any type manual wheelchair      inadequate  []$  scooter/POV inadequate      []$  requires dependent mobility          MANUAL MOBILITY      []$  Standard manual wheelchair  K0001      Arm:    []$  both []$  right  []$  left      Foot:   []$  both []$  right   []$  left  []$  self-propels wheelchair  []$  will use on regular basis  []$  chair fits throughout home  []$  willing and motivated to use  []$  propels with assistance     []$  dependent use   []$  Standard hemi-manual wheelchair  K0002      Arm:    []$  both []$  right  []$  left      Foot:   []$  both []$  right   []$  left  []$  lower seat height required to foot propel  []$  short stature  []$  self-propels wheelchair  []$  will use on regular basis  []$  chair fits throughout home  []$  willing and motivated to use   []$  propels with assistance  []$  dependent use   []$  Lightweight manual wheelchair  K0003      Arm:    []$  both []$  right  []$  left      Foot:   []$  both  []$  right  []$  left                   []$  hemi height required  []$  medical condition and weight of  wheelchair affect ability to self      propel standard manual wheelchair in the residence  []$  can and does self-propel (marginal propulsion skills)  []$  daily use _________hours  []$  chair fits throughout home  []$  willing and motivated to use  []$  lower seat height required to foot propel  []$  short stature   []$  High strength lightweight manual  wheelchair (Breezy Ultra 4)  K0004     Arm:    []$  both []$  right  []$  left     Foot:   []$  both []$  right   []$  left                                                                  []$  hemi height required []$  medical condition  and weight of wheelchair affect ability to self propel while engaging in frequent MRADL(s) that cannot be performed in a standard or lightweight manual wheelchair  []$  daily use _________hours  []$  chair fits throughout home  []$  willing and motivated to use  []$  prevent repetitive use injuries   []$  lower seat height required to foot propel  []$  short stature    []$   Ultra-lightweight manual wheelchair  K0005     Arm:    []$  both []$  right  []$  left     Foot:   []$  both []$  right  []$  left       []$  hemi height required  []$  heavy duty    Front seat to floor _____ inches      Rear seat to floor _____ inches      Back height _____ inches     Back angle ______ degrees      Front angle _____ degrees  []$   full-time manual wheelchair user  []$  Requires individualized fitting and optimal adjustments for multiple features that include adjustable axle configuration, fully adjustable center of gravity, wheel camber, seat and back angle, angle of seat slope, which cannot be accommodated by a K0001 through K0004 manual wheelchair  []$  prevent repetitive use injuries  []$  daily use_________hours   []$  user has high activity patterns that frequently require  them  to go out into the community for the purpose of independently accomplishing high level MRADL activities. Examples of these might include a combination of; shopping, work, school, Science writer, childcare, independently loading and unloading from a vehicle etc.  []$  lower seat height required to foot propel  []$  short stature  []$  heavy duty -  weight over 250lbs   []$  Current chair is a K0005   manufacture:___________________  model:_________________  serial#____________________  age:_________    []$  First time [id number] user (complete trial)  K0004 time and # of strokes to propel 30 feet: ________seconds _________strokes  [id number] time and # of strokes to propel 30 feet: ________seconds _________strokes  What was the result of the trial between the K0004 and K0005  manual wheelchair? ___    What features of the K0005 w/c are needed as compared to the K0004 base? Why?___    []$  adjustable seat and back angle changes the angle of seat slope of the frame to attain a gravity assisted position for efficient propulsion and proper weight distribution along the frame     []$  the front of the wheelchair will be configured higher than the back of the chair to allow gravity to assist the user with postural stability  []$  the center of the wheel will be positioned for stability, safety and efficient propulsion  []$  adjustable axle allows for vertical, horizontal, camber and overall width changes  throughout the wheels for adjustment of the client's exact needs and abilities.   []$  adjustable axle increases the stability and function of the chair allowing for adjustment of the center of gravity.   []$  accommodates the client's anatomical position in the chair maximizing independence in mobility and maneuverability in all environments.   []$  create a minimal fixed tilt-in space to assist in positioning.   []$  Describe users full-time manual wheelchair activity patterns:___    []$  Power assist Comments:  []$  prevent repetitive use injuries  []$  repetitive strain injury present in    shoulder girdle    []$  shoulder pain is (> or =) to 7/10     during manual propulsion       Current Pain _____/10  []$  requires conservation of energy to participate in MRADL(s) runable to propel up ramps or curbs using manual wheelchair  []$  been K0005 user greater than one year  []$  user unwilling to use power      wheelchair (reason): []$  less expensive option to power   wheelchair   []$  rim activated power assist -  decreased strength   []$  Heavy duty manual wheelchair       K0006     Arm:    []$  both []$  right  []$  left     Foot:   []$  both []$  right  []$  left     []$  hemi height required    []$  Dependent base  []$  user exceeds 250lbs  []$  non-functional ambulator    []$  extreme spasticity  []$  over  active movement   []$  broken frame/hx of repeated     repairs  []$  able to self-propel in residence       []$  lower seat to floor height required  []$  unable to self-propel in residence   []$  Extra heavy duty manual wheelchair  K0007     Arm:    []$  both []$  right  []$  left     Foot:   []$  both []$  right  []$  left     []$  hemi height required  []$  Dependent base  []$  user exceeds 300lbs  []$  non-functional ambulator    []$  able to self-propel in residence   []$  lower seat to floor height required  []$  unable to self-propel in residence     []$  Manual wheelchair with tilt [phone]      (Manual "Tilt-n-Space")  []$  patient is dependent for transfers  []$  patient requires frequent       positioning for pressure relief   []$  patient requires frequent      positioning for poor/absent trunk control        []$  Stroller Base  []$  infant/child   []$  unable to propel manual      wheelchair  []$  allows for growth  []$  non-functional ambulator  []$  non-functional UE  []$  independent mobility is not a goal at this time    Garey handles  []$  extended  rangle adjustable   []$  standard  []$  caregiver access  []$  caregiver assist    []$  allows "hooking" to enable      increased ability to perform ADLs or maintain balance   []$  Angle Adjustable Back  []$  postural control  []$  control of tone/spasticity  []$  accommodation of range of motion  []$  UE functional control  []$  accommodation for seating system    Rear wheel placement  []$  std/fixed rfully adjustableramputee   []$  camber ________degree  []$  removable rear wheel  []$  non-removable rear wheel  Wheel size _______  Wheel style_______________________  []$  improved UE access to wheels  []$  increase propulsion ability  []$  improved stability  []$  changing angle in space for      improvement of postural stability  []$  remove for transport    []$  allow for seating system to fit on      base  []$  amputee placement  []$  1-arm drive access   r R  r L  []$  enable  propulsion of manual       wheelchair with one arm    []$  amputee placement   Wheel rims/ Hand rims  []$  Standard    []$  Specialized-____ []$  provide ability to propel manual   []$  increase self-propulsion with hand wheelchair weakness/decreased grasp     []$  Spoke protector/guard   []$  prevent hands from getting caught in spokes   Tires:  []$  pneumatic  []$  flat free inserts  []$  solid  Style:  []$  decrease roll resistance              []$   prevent frequent flats  []$  increase shock absorbency  []$  decrease maintenance   []$  decrease pain from road shock    []$  decrease spasms from road shock    Wheel Locks:    []$  push []$  pull []$  scissor  []$  lock wheels for transfers  []$  lock wheels from rolling   Brake/wheel lock extension:  []$  R  []$  L  []$  allow user to operate wheel locks due to decreased reach or strength   Caster housing:  Caster size:                      Style:                                          []$  suspension fork  []$  maneuverability   []$  stability of wheelchair   []$  durability  []$  maintenance  []$  angle adjustment for posture  []$  allow for feet to come under        wheelchair base  []$  allows change in seat to floor      height   []$  increase shock absorbency  []$  decrease pain from road shock  []$  decrease spasms from road    shock   []$  Side guards  []$  prevent clothing getting caught in wheel or becoming soiled  rprovide hip and pelvic stability  []$  eliminates contact between body and wheels  []$  limit hand contact with wheels   []$  Anti-tippers      []$  prevent wheelchair from tipping    backward  []$  assist caregiver with curbs     POWER MOBILITY      []$  Scooter/POV    []$  can safely operate   []$  can safely transfer   []$  has adequate trunk stability   []$  cannot functionally propel  manual wheelchair    [x]$  Power mobility base    [x]$  non-ambulatory   [x]$  cannot functionally propel manual wheelchair   [x]$  cannot functionally and safely      operate scooter/POV  [x]$  can safely  operate power       wheelchair  [x]$  home is accessible  [x]$  willing to use power wheelchair     Tilt  [x]$  Powered tilt on powered chair  []$  Powered tilt on manual chair  []$  Manual tilt on manual chair Comments:  [x]$  change position for pressure      []$  elief/cannot weight shift   [x]$  change position against      gravitational force on head and      shoulders   [x]$  decrease pain  []$  blood pressure management   []$  control autonomic dysreflexia  []$  decrease respiratory distress  []$  management of spasticity  []$  management of low tone  []$  facilitate postural control   []$  rest periods   [x]$  control edema  [x]$  increase sitting tolerance   []$  aid with transfers     Recline   []$  Power recline on power chair  []$  Manual recline on manual chair  Comments:    []$  intermittent catheterization  []$  manage spasticity  []$  accommodate femur to back angle  []$  change position for pressure relief/cannot weight shift rhigh risk of pressure sore development  []$  tilt alone does not accomplish     effective pressure relief, maximum pressure relief achieved at -      _______ degrees tilt   _______ degrees  recline   []$  difficult to transfer to and from bed []$  rest periods and sleeping in chair  []$  repositioning for transfers  []$  bring to full recline for ADL care  []$  clothing/diaper changes in chair  []$  gravity PEG tube feeding  []$  head positioning  []$  decrease pain  []$  blood pressure management   []$  control autonomic dysreflexia  []$  decrease respiratory distress  []$  user on ventilator     Elevator on mobility base  []$  Power wheelchair  []$  Scooter  []$  increase Indep in transfers   []$  increase Indep in ADLs    []$  bathroom function and safety  []$  kitchen/cooking function and safety  []$  shopping  []$  raise height for communication at standing level  []$  raise height for eye contact which reduces cervical neck strain and pain  []$  drive at raised height for safety and navigating crowds  []$  Other:    []$  Vertical position system  (anterior tilt)     (Drive locks-out)    []$  Stand       (Drive enabled)  []$  independent weight bearing  []$  decrease joint contractures  []$  decrease/manage spasticity  []$  decrease/manage spasms  []$  pressure distribution away from   scapula, sacrum, coccyx, and ischial tuberosity  []$  increase digestion and elimination   []$  access to counters and cabinets  []$  increase reach  []$  increase interaction with others at eye level, reduces neck strain  []$  increase performance of       MRADL(s)      Power elevating legrest    []$  Center mount (Single) 85-170 degrees       []$  Standard (Pair) 100-170 degrees  []$  position legs at 90 degrees, not available with std power ELR  []$  center mount tucks into chair to decrease turning radius in home, not available with std power ELR  []$  provide change in position for LE  []$  elevate legs during recline    []$  maintain placement of feet on      footplate  []$  decrease edema  []$  improve circulation  []$  actuator needed to elevate legrest  []$  actuator needed to articulate legrest preventing knees from flexing  []$  Increase ground clearance over      curbs  []$   STD (pair) independently                     elevate legrest   POWER WHEELCHAIR CONTROLS      Controls/input device  []$  Expandable  [x]$  Non-expandable  []$  Proportional  []$  Right Hand []$  Left Hand  []$  Non-proportional/switches/head-array  []$  Electrical/proximity         []$   Mechanical      Manufacturer:___________________   Type:________________________ [x]$  provides access for controlling wheelchair  [x]$  programming for accurate control  []$  progressive disease/changing condition  []$  required for alternative drive      controls       []$  lacks motor control to operate  proportional drive control  []$  unable to understand proportional controls  []$  limited movement/strength  []$  extraneous movement / tremors / ataxic / spastic       []$  Upgraded electronics  controller/harness    []$  Single power (tilt or recline)   []$  Expandable    []$  Non-expandable plus   []$  Multi-power (tilt, recline, power legrest, power seat lift, vertical positioning system, stand)  []$  allows input device to communicate with drive motors  []$  harness provides necessary connections between the controller, input device, and  seat functions     []$  needed in order to operate power seat functions through joystick/ input device  []$  required for alternative drive controls     []$  Enhanced display  []$  required to connect all alternative drive controls   []$  required for upgraded joystick      (lite-throw, heavy duty, micro)  []$  Allows user to see in which mode and drive the wheelchair is set; necessary for alternate controls       []$  Upgraded tracking electronics  []$  correct tracking when on uneven surfaces makes switch driving more efficient and less fatiguing  []$  increase safety when driving  []$  increase ability to traverse thresholds    []$  Safety / reset / mode switches     Type:    []$  Used to change modes and stop the wheelchair when driving     [x]$  [facility] for joystick / input device/switches  [x]$  swing away for access or transfers   [x]$  attaches joystick / input device / switches to wheelchair   [x]$  provides for consistent access  []$  midline for optimal placement    []$  Attendant controlled joystick plus     mount  []$  safety  []$  long distance driving  []$  operation of seat functions  []$  compliance with transportation regulations    [x]$  Battery Group 24 x 2 [x]$  required to power (power assist / scooter/ power wc / other):   []$  Power inverter (24V to 12V)  []$  required for ventilator / respiratory equipment / other:     CHAIR OPTIONS MANUAL & POWER      Armrests   [x]$  adjustable height []$  removable  []$  swing away []$  fixed  [x]$  flip back  []$  reclining  []$  full length pads []$  desk []$  tube arms []$  gel pads  [x]$  provide support with elbow at 90    []$  remove/flip back/swing away  for  transfers  [x]$  provide support and positioning of upper body    [x]$  allow to come closer to table top  []$  remove for access to tables  []$  provide support for w/c tray  []$  change of height/angles for       variable activities   []$  Elbow support / Elbow stop  []$  keep elbow positioned on arm pad  []$  keep arms from falling off arm pad  during tilt and/or recline   Upper Extremity Support  []$  Arm trough  []$   R  []$   L  Style:  []$  swivel mount []$  fixed mount   []$  posterior hand support  []$   tray  []$  full tray  []$  joystick cut out  []$   R  []$   L  Style:  []$  decrease gravitational pull on      shoulders  []$  provide support to increase UE  function  []$  provide hand support in natural    position  []$  position flaccid UE  []$  decrease subluxation    []$  decrease edema       []$  manage spasticity   []$  provide midline positioning  []$  provide work surface  []$  placement for AAC/ Computer/ EADL       Hangers/ Legrests   []$  ______ degree  []$  Elevating []$  articulating  []$  swing away []$  fixed []$  lift off  []$  heavy duty []$  adjustable knee angle  []$  adjustable calf panel   []$  longer extension tube              []$  provide LE support  []$   maintain placement of feet on      footplate   []$  accommodate lower leg length  []$  accommodate to hamstring       tightness  []$  enable transfers  []$  provide change in position for LE's  []$  elevate legs during recline    []$  decrease edema  []$  durability      Foot support   []$  footplate []$  R []$  L []$  flip up           []$  Depth adjustable   []$  angle adjustable  []$  foot board/one piece    []$  provide foot support  []$  accommodate to ankle ROM  []$  allow foot to go under wheelchair base  []$  enable transfers     []$  Shoe holders  []$  position foot    []$  decrease / manage spasticity  []$  control position of LE  []$  stability    []$  safety     []$  Ankle strap/heel      loops  []$  support foot on foot support  []$  decrease extraneous movement  []$  provide input to heel    []$  protect foot     []$  Amputee adapter []$  R  []$  L     Style:                  Size:  []$  Provide support for stump/residual extremity    []$  Transportation tie-down  []$  to provide crash tested tie-down brackets    []$  Crutch/cane holder    []$  O2 holder    []$  IV hanger   []$  Ventilator tray/mount    []$  stabilize accessory on wheelchair       Component  Justification     [x]$  Seat cushion      [x]$  accommodate impaired sensation  []$  decubitus ulcers present or history  []$  unable to shift weight  [x]$  increase pressure distribution  []$  prevent pelvic extension  []$  custom required "off-the-shelf"    seat cushion will not accommodate deformity  [x]$  stabilize/promote pelvis alignment  []$  stabilize/promote femur alignment  []$  accommodate obliquity  []$  accommodate multiple deformity  []$  incontinent/accidents  [x]$  low maintenance     []$  seat mounts                 []$  fixed []$  removable  []$  attach seat platform/cushion to wheelchair frame    []$  Seat wedge    []$  provide increased aggressiveness of seat shape to decrease sliding  down in the seat  []$  accommodate ROM        []$  Cover replacement   []$  protect back or seat cushion  []$  incontinent/accidents    []$  Solid seat / insert    []$  support cushion to prevent      hammocking  []$  allows attachment of cushion to mobility base    []$  Lateral pelvic/thigh/hip     support (Guides)     []$  decrease abduction  []$  accommodate pelvis  []$  position upper legs  []$  accommodate spasticity  []$  removable for transfers     []$  Lateral pelvic/thigh      supports mounts  []$  fixed   []$  swing-away   []$  removable  []$  mounts lateral pelvic/thigh supports     []$  mounts lateral pelvic/thigh supports swing-away or removable for transfers    []$  Medial thigh support ([place]ommel)  []$ decrease adduction  []$ accommodate ROM  []$  remove for transfers   []$  alignment      []$  Medial thigh   []$   fixed      support mounts      []$  swing-away   []$  removable  []$  mounts medial thigh  supports   []$  Mounts medial supports swing- away or removable for transfers       Component  Justification   []$  Back       []$  provide posterior trunk support []$  facilitate tone  []$  provide lumbar/sacral support []$  accommodate deformity  []$  support trunk in midline   []$  custom required "off-the-shelf" back support will not accommodate deformity   []$  provide lateral trunk support []$  accommodate or decrease tone            []$  Back mounts  []$  fixed  []$  removable  []$  attach back rest/cushion to wheelchair frame   []$  Lateral trunk      supports  []$  R []$  L  []$  decrease lateral trunk leaning  []$  accommodate asymmetry    []$  contour for increased contact  []$  safety    []$  control of tone    []$  Lateral trunk      supports mounts  []$  fixed  []$  swing-away   []$  removable  []$  mounts lateral trunk supports     []$  Mounts lateral trunk supports swing-away or removable for transfers   []$  Anterior chest      strap, vest     []$  decrease forward movement of shoulder  []$  decrease forward movement of trunk  []$  safety/stability  []$  added abdominal support  []$  trunk alignment  []$  assistance with shoulder control   []$  decrease shoulder elevation    [x]$  Headrest      [x]$  provide posterior head support  [x]$  provide posterior neck support  []$  provide lateral head support  []$  provide anterior head support  []$  support during tilt and recline  []$  improve feeding     []$  improve respiration  []$  placement of switches  []$  safety    []$  accommodate ROM   []$  accommodate tone  []$  improve visual orientation   [x]$  Headrest           []$  fixed []$  removable []$  flip down      Mounting hardware   []$  swing-away laterals/switches  [x]$  mount headrest   []$  mounts headrest flip down or  removable for transfers  []$  mount headrest swing-away laterals   []$  mount switches     []$  Neck Support    []$  decrease neck rotation  []$  decrease forward neck flexion   Pelvic Positioner    []$  std hip belt          []$  padded hip belt   []$  dual pull hip belt  []$  four point hip belt  []$  stabilize tone  []$  decrease falling out of chair  []$  prevent excessive extension  []$  special pull angle to control      rotation  []$  pad for protection over boney   prominence  []$  promote comfort    []$  Essential needs        bag/pouch   []$  medicines []$  special food rorthotics []$  clothing changes  []$  diapers  []$  catheter/hygiene []$  ostomy supplies   The above equipment has a life- long use expectancy.  Growth and changes in medical and/or functional conditions would be the exceptions.   SUMMARY:  Why mobility device was selected; include why a lower level device is not appropriate:   ASSESSMENT:  CLINICAL IMPRESSION: Patient is a 67 y.o. male who was seen today for physical  therapy evaluation and treatment for evaluation for wheelchair. Patient is currently using a power wheelchair that is >72 years old components are significantly worn and have been replaced.  Pt has significant weakness in bil UE and LE. Patient is not a functional Ambulator and is only using RW for transfers. Patient has significant history of falls in last 6 months. Patient also has lost significant weight since his last wheelchair evaluation. Due to increased wear and tear in his current wheelchair and risk for falls, patient will benefit from replacement wheelchair with group 2 heavy duty power wheelchair.     OBJECTIVE IMPAIRMENTS Abnormal gait, decreased activity tolerance, decreased balance, decreased endurance, decreased mobility, difficulty walking, decreased ROM, decreased strength, decreased safety awareness, increased edema, impaired flexibility, postural dysfunction, obesity, and pain.   ACTIVITY LIMITATIONS carrying, lifting, bending, standing, squatting, stairs, transfers, bathing, toileting, dressing, and hygiene/grooming  PARTICIPATION LIMITATIONS: meal prep, cleaning, laundry, shopping, and community activity  PERSONAL FACTORS Age, Past/current  experiences, and 3+ comorbidities: generalized OA, Edema, DM  are also affecting patient's functional outcome.   REHAB POTENTIAL: Good  CLINICAL DECISION MAKING: Stable/uncomplicated  EVALUATION COMPLEXITY: Moderate                                   GOALS: One time visit. No goals established.    PLAN: PT FREQUENCY: one time visit    Kerrie Pleasure, PT 10/19/2022, 2:23 PM    I concur with the above findings and recommendations of the therapist:  Physician name printed:         Physician's signature:      Date:

## 2022-10-24 ENCOUNTER — Encounter (HOSPITAL_BASED_OUTPATIENT_CLINIC_OR_DEPARTMENT_OTHER): Payer: Medicare PPO | Attending: General Surgery | Admitting: General Surgery

## 2022-10-24 DIAGNOSIS — I132 Hypertensive heart and chronic kidney disease with heart failure and with stage 5 chronic kidney disease, or end stage renal disease: Secondary | ICD-10-CM | POA: Insufficient documentation

## 2022-10-24 DIAGNOSIS — L98499 Non-pressure chronic ulcer of skin of other sites with unspecified severity: Secondary | ICD-10-CM | POA: Diagnosis present

## 2022-10-24 DIAGNOSIS — M1990 Unspecified osteoarthritis, unspecified site: Secondary | ICD-10-CM | POA: Diagnosis not present

## 2022-10-24 DIAGNOSIS — E1142 Type 2 diabetes mellitus with diabetic polyneuropathy: Secondary | ICD-10-CM | POA: Insufficient documentation

## 2022-10-24 DIAGNOSIS — Z6841 Body Mass Index (BMI) 40.0 and over, adult: Secondary | ICD-10-CM | POA: Insufficient documentation

## 2022-10-24 DIAGNOSIS — E662 Morbid (severe) obesity with alveolar hypoventilation: Secondary | ICD-10-CM | POA: Diagnosis not present

## 2022-10-24 DIAGNOSIS — N186 End stage renal disease: Secondary | ICD-10-CM | POA: Diagnosis not present

## 2022-10-24 DIAGNOSIS — E11622 Type 2 diabetes mellitus with other skin ulcer: Secondary | ICD-10-CM | POA: Insufficient documentation

## 2022-10-24 DIAGNOSIS — I5032 Chronic diastolic (congestive) heart failure: Secondary | ICD-10-CM | POA: Diagnosis not present

## 2022-10-24 DIAGNOSIS — Z9884 Bariatric surgery status: Secondary | ICD-10-CM | POA: Diagnosis not present

## 2022-10-24 DIAGNOSIS — E1151 Type 2 diabetes mellitus with diabetic peripheral angiopathy without gangrene: Secondary | ICD-10-CM | POA: Insufficient documentation

## 2022-10-24 DIAGNOSIS — E1122 Type 2 diabetes mellitus with diabetic chronic kidney disease: Secondary | ICD-10-CM | POA: Diagnosis not present

## 2022-10-25 NOTE — Progress Notes (Addendum)
[name], Ashad ([id number]) [id number] Page 1 of 10 Visit Report for 10/24/2022 Chief Complaint Document Details Patient Name: Date of Service: [name], [name] 10/24/2022 10:00 [name] M Medical Record Number: [id number] Patient Account Number: [id number] Date of Birth/Sex: Treating RN: [birth year] (67 y.o. M) Primary Care Provider: [name] Other Clinician: Referring Provider: Treating Provider/Extender: [name] in Treatment: 9 Information Obtained from: Patient Chief Complaint 11/09/2018; patient is here for review of wounds on his dorsal right first and second toes and on the dorsal left first toe. 02/22/2022: The patient is here for [name] new ulcer on his posterior left heel. 08/18/2022: here with new wound on abdomen, similar to prior Electronic Signature(s) Signed: 10/24/2022 10:29:54 AM By: Fredirick Maudlin MD FACS Entered By: Fredirick Maudlin on 10/24/2022 10:29:54 -------------------------------------------------------------------------------- HPI Details Patient Name: Date of Service: [name], [name] 10/24/2022 10:00 [place] Record Number: [id number] Patient Account Number: [id number] Date of Birth/Sex: Treating RN: 1956-07-08 (67 y.o. M) Primary Care Provider: [name] Other Clinician: Referring Provider: Treating Provider/Extender: [name] in Treatment: 9 History of Present Illness HPI Description: READMISSION 11/09/2018 This is [name] now 67 year old man that we had in this clinic over [name] multitude of years but most recently in 2015. He had wounds on his plantar foot initially in 2007 and 2008 and I think subsequently was seen in 2012 then 2014 into the mid part of 2015 with wounds on his toes. He is [name] type II diabetic with peripheral neuropathy but no known PAD. The patient states he was doing well until about [name] month ago he noted an area on the left great toe which was superficial and then an  area on the right great and second toes about [name] week later. He is not sure how this happened he simply noticed this when he was lying in bed at night. He has been using peroxide. He has not seen [name] doctor. He has not been on antibiotics and has had no x-rays. Past medical history; type 2 diabetes with peripheral neuropathy, hypertension, hypothyroidism, low B12 levels, iron deficiency anemia, ventral hernia, diabetic nephropathy, chronic diastolic heart failure and obstructive sleep apnea. ABIs in this clinic were 1.2 on the right and 1.1 on the left 2/28; patient arrived last week for areas on his left first right first and right second toes. X-ray of the right foot did not show any osseous abnormality. Culture of the right great toe showed Staphylococcus aureus methicillin sensitive and he is on dicloxacillin which he started 3 days ago. 3/6; patient with wounds on his right first and second toes and left first toes. The area over the left first toe is extensive we have been using silver alginate. Culture grew MSSA and he is on dicloxacillin which I prescribed on the 25th. He is still taking this which I am not really sure of the reason. He expressed knowledge that this was 4 times [name] day he should have been out of this around March 2 if he picked it up on the 25th or they gave him the right number of pills. Nevertheless our nurses report more purulent drainage and odor 3/17-Patient returns for the 3 wounds the left first toe and the right first and second toe dorsal surfaces. The left first toe is closed up and healed, the right first toe dorsal surface wound is extensive and appears the same as last time, no purulent drainage or odor reported per nurse, left second toe wound is slightly smaller. He  has completed the dicloxacillin as of today. Noted that his culture previously had grown MSSA. We have been using calcium alginate to the wounds. X-rays have been reviewed. ABIs were reviewed as  well. 3/27; patient returns for review of wounds on his right first and right second toes. The wound on the left first toe is healed. [name], [name] ([id number]) [id number] Page 2 of 10 4/10; 2-week follow-up. The area on the right second toe dorsally is healed. Per the patient the left first toe remains healed. He still has an extensive area dorsally over the right first toe inner phalangeal joint area. Rolled up hyper granulated tissue with probably [name] nonviable surface 4/24; 2-week follow-up. The areas on the right second toe dorsally and right first toe dorsally is healed. The area on the dorsal left first toe over the inner phalangeal joint is smaller but still hyper granulated we have been using Hydrofera Blue 5/15; 3-week follow-up. The areas on the right first toe. Not the left as I said on 4/24. He is using Hydrofera Blue. 5/29; 3-week follow-up. The area on his dorsal right toe over the right inter phalangeal joint is just about closed. We have been using Hydrofera Blue. In passing he showed me his right thumb which is exquisitely tender over the tip of the digit. 6/5; the area on the dorsal right toe over the inner phalangeal joint is closed. Right thumb is better which I gave him antibiotics for last week. Still some tenderness but considerably improved READMISSION 02/22/2022 This is [name] 66 year old type II diabetic with congestive heart failure, end-stage renal disease on hemodialysis, peripheral artery disease, and morbid obesity. He has developed an ulcer on his left posterior heel that he believes is secondary to his health assistance not adequately moisturizing his feet. He says it first appeared about 2 weeks ago. He does not have sensation in his feet. He has not noticed any foul odor or drainage. His last hemoglobin A1c was 7.9 at the beginning of January. ABI in clinic today was 1.15. 03/09/2022: He has [name] new wound on his abdomen that he says started out  as [name] lump and then the skin broke open when he bumped it on [name] portion of his hospital bed at home. There is fat exposed. The wound on his heel is [name] little bit smaller but still has [name] very pale surface. There is periwound callus accumulation. 03/30/2022: He had another small wound opened up on his abdomen. He says that he thinks these are starting as blisters. He has been on Mounjaro for weight loss and it looks like the loose skin resulting from losing weight might be rubbing and causing small friction injuries. The wound that I treated with chemical cauterization last visit is much smaller; the new wound has [name] very similar appearance to this. His heel ulcer is smaller with just [name] little bit of slough buildup on the surface. He continues to have fairly impressive periwound callus formation. 04/07/2022: Both abdominal wounds are smaller today and there is less hypertrophic granulation tissue protruding. The wound on his heel also continues to contract. There is just [name] layer of slough on the surface. No concern for infection. 04/29/2022: The previous 2 abdominal wounds have closed but he has [name] new 1 that appears identical to the previous lesions. The wound on his heel is smaller but has [name] thick layer of callus overlying it. 05/13/2022: His heel wound has healed. The wound on his abdomen remains small but still has some hypertrophic  granulation tissue present. He says it drains serous fluid from time to time. READMISSION 08/18/2022: [name] returns with reopening of the small wound on his abdomen. He said it closed and then he recently went swimming and later that day, he noted some serosanguineous drainage on his shirt. He has not had any fevers or chills. The drainage has never been purulent or foul-smelling. As per his previous admission, the wound is small and circular with fat exposed. No concern for infection. 12/14; the patient's original wound that he came in with has healed and is  epithelialized however just below this he has another open area. He noticed this 3 days ago with [name] significant amount of sanguinous drainage. No pain no obvious purulence. 09/07/2022: The wound is shallower this week. No purulent drainage identified. The culture that was taken grew out just [name] small amount of Staph aureus pansensitive except to ciprofloxacin. No antibiotic was prescribed by our clinic, but apparently the patient's primary care provider prescribed an antibiotic, but he does not recall the name and it is not available in the electronic medical record. 09/29/2022: Apparently the patient's home health providers have not been packing the wound. It now probes to [name] depth of 2.4 cm. It is clean without any slough or purulent drainage. No concern for infection. 10/24/2022: No real change to the wound. There is still about the same amount of depth. The orifice is narrow and this seems to be complicating appropriate packing of the site. Electronic Signature(s) Signed: 10/24/2022 10:38:42 AM By: Fredirick Maudlin MD FACS Previous Signature: 10/24/2022 10:30:39 AM Version By: Fredirick Maudlin MD FACS Entered By: Fredirick Maudlin on 10/24/2022 10:38:42 -------------------------------------------------------------------------------- Chemical Cauterization Details Patient Name: Date of Service: [name], [name] 10/24/2022 10:00 [name] M Medical Record Number: [id number] Patient Account Number: [id number] Date of Birth/Sex: Treating RN: 1956/06/28 (67 y.o. [name] Primary Care Provider: [name] Other Clinician: Referring Provider: Treating Provider/Extender: [name] in Treatment: 9 Procedure Performed for: Wound #24 Distal Abdomen - midline Performed By: Physician Fredirick Maudlin, MD Post Procedure Diagnosis Same as Pre-procedure Notes scribed for Dr. Celine Ahr by Adline Peals, RN [place], [place] ([id number]) [phone].pdf Page  3 of 10 Electronic Signature(s) Signed: 10/24/2022 11:21:54 AM By: Fredirick Maudlin MD FACS Signed: 10/24/2022 4:26:52 PM By: Sabas Sous By: Adline Peals on 10/24/2022 10:28:00 -------------------------------------------------------------------------------- Physical Exam Details Patient Name: Date of Service: [name], [name] 10/24/2022 10:00 [name] M Medical Record Number: [id number] Patient Account Number: [id number] Date of Birth/Sex: Treating RN: 1956/02/18 (68 y.o. M) Primary Care Provider: [name] Other Clinician: Referring Provider: Treating Provider/Extender: [name] in Treatment: 9 Constitutional . . . . no acute distress. Respiratory Normal work of breathing on room air. Notes 10/24/2022: No real change to the wound. There is still about the same amount of depth. The orifice is narrow and this seems to be complicating appropriate packing of the site. Electronic Signature(s) Signed: 10/24/2022 10:39:10 AM By: Fredirick Maudlin MD FACS Entered By: Fredirick Maudlin on 10/24/2022 10:39:10 -------------------------------------------------------------------------------- Physician Orders Details Patient Name: Date of Service: [name], [name] 10/24/2022 10:00 [name] M Medical Record Number: [id number] Patient Account Number: [id number] Date of Birth/Sex: Treating RN: [birth year] (67 y.o. [name] Primary Care Provider: [name] Other Clinician: Referring Provider: Treating Provider/Extender: [name] in Treatment: 9 Verbal / Phone Orders: No Diagnosis Coding ICD-10 Coding Code Description L98.499 Non-pressure chronic ulcer of skin of other sites with unspecified severity E66.2 Morbid (severe)  obesity with alveolar hypoventilation I73.9 Peripheral vascular disease, unspecified Z02.58 Chronic diastolic (congestive) heart failure E11.622 Type 2 diabetes mellitus with other skin ulcer Follow-up  Appointments ppointment in 2 weeks. - Dr. Celine Ahr Rm 2 Return [name] Bathing/ Shower/ Hygiene May shower and wash wound with soap and water. - Change dressing every other day [place] wound care orders this week; continue [place] for wound care. May utilize formulary equivalent dressing for wound treatment [name], [name] ([id number]) [id number] Page 4 of 10 orders unless otherwise specified. - THERE IS DEPTH TO THE WOUND 2.3 CM PLEASE PACK THE WOUND USING [name] SKINNY PROBE TO ADEQUATELY GET THE DRESSING IN THE WOUND Dressing changes to be completed by [place] on Monday / Wednesday / Friday except when patient has scheduled visit at [facility]. Other Home Health Orders/Instructions: - [facility]ound [facility] Wound #24 - Abdomen - midline Wound Laterality: Distal Cleanser: Soap and Water 3 x Per Week/30 Days Discharge Instructions: May shower and wash wound with dial antibacterial soap and water prior to dressing change. Prim Dressing: Iodoform packing strip 1/4 (in) 3 x Per Week/30 Days ary Discharge Instructions: Lightly pack as instructed Secondary Dressing: Zetuvit Plus Silicone Border Dressing 4x4 (in/in) 3 x Per Week/30 Days Discharge Instructions: Apply silicone border over primary dressing as directed. Add-Ons: Gloves, Large 3 x Per Week/30 Days Electronic Signature(s) Signed: 10/24/2022 10:39:34 AM By: Fredirick Maudlin MD FACS Entered By: Fredirick Maudlin on 10/24/2022 10:39:33 -------------------------------------------------------------------------------- Problem List Details Patient Name: Date of Service: [name], [name] 10/24/2022 10:00 [name] M Medical Record Number: [id number] Patient Account Number: [id number] Date of Birth/Sex: Treating RN: 08/20/1956 (67 y.o. M) Primary Care Provider: [name] Other Clinician: Referring Provider: Treating Provider/Extender: [name] in Treatment: 9 Active  Problems ICD-10 Encounter Code Description Active Date MDM Diagnosis L98.499 Non-pressure chronic ulcer of skin of other sites with unspecified severity 08/18/2022 No Yes E66.2 Morbid (severe) obesity with alveolar hypoventilation 08/18/2022 No Yes I73.9 Peripheral vascular disease, unspecified 08/18/2022 No Yes Q00.86 Chronic diastolic (congestive) heart failure 08/18/2022 No Yes E11.622 Type 2 diabetes mellitus with other skin ulcer 08/18/2022 No Yes Inactive Problems Resolved Problems Electronic Signature(s) Signed: 10/24/2022 10:29:29 AM By: Fredirick Maudlin MD [place], [place] ([id number]) [phone].pdf Page 5 of 10 Entered By: Fredirick Maudlin on 10/24/2022 10:29:29 -------------------------------------------------------------------------------- Progress Note Details Patient Name: Date of Service: [name], [name] 10/24/2022 10:00 [name] M Medical Record Number: [id number] Patient Account Number: [id number] Date of Birth/Sex: Treating RN: Jun 19, 1956 (67 y.o. M) Primary Care Provider: [name] Other Clinician: Referring Provider: Treating Provider/Extender: [name] in Treatment: 9 Subjective Chief Complaint Information obtained from Patient 11/09/2018; patient is here for review of wounds on his dorsal right first and second toes and on the dorsal left first toe. 02/22/2022: The patient is here for [name] new ulcer on his posterior left heel. 08/18/2022: here with new wound on abdomen, similar to prior History of Present Illness (HPI) READMISSION 11/09/2018 This is [name] now 67 year old man that we had in this clinic over [name] multitude of years but most recently in 2015. He had wounds on his plantar foot initially in 2007 and 2008 and I think subsequently was seen in 2012 then 2014 into the mid part of 2015 with wounds on his toes. He is [name] type II diabetic with peripheral neuropathy but no known PAD. The patient states he was doing well  until about [name] month ago he noted an area on the left great  toe which was superficial and then an area on the right great and second toes about [name] week later. He is not sure how this happened he simply noticed this when he was lying in bed at night. He has been using peroxide. He has not seen [name] doctor. He has not been on antibiotics and has had no x-rays. Past medical history; type 2 diabetes with peripheral neuropathy, hypertension, hypothyroidism, low B12 levels, iron deficiency anemia, ventral hernia, diabetic nephropathy, chronic diastolic heart failure and obstructive sleep apnea. ABIs in this clinic were 1.2 on the right and 1.1 on the left 2/28; patient arrived last week for areas on his left first right first and right second toes. X-ray of the right foot did not show any osseous abnormality. Culture of the right great toe showed Staphylococcus aureus methicillin sensitive and he is on dicloxacillin which he started 3 days ago. 3/6; patient with wounds on his right first and second toes and left first toes. The area over the left first toe is extensive we have been using silver alginate. Culture grew MSSA and he is on dicloxacillin which I prescribed on the 25th. He is still taking this which I am not really sure of the reason. He expressed knowledge that this was 4 times [name] day he should have been out of this around March 2 if he picked it up on the 25th or they gave him the right number of pills. Nevertheless our nurses report more purulent drainage and odor 3/17-Patient returns for the 3 wounds the left first toe and the right first and second toe dorsal surfaces. The left first toe is closed up and healed, the right first toe dorsal surface wound is extensive and appears the same as last time, no purulent drainage or odor reported per nurse, left second toe wound is slightly smaller. He has completed the dicloxacillin as of today. Noted that his culture previously had grown MSSA. We have been  using calcium alginate to the wounds. X-rays have been reviewed. ABIs were reviewed as well. 3/27; patient returns for review of wounds on his right first and right second toes. The wound on the left first toe is healed. 4/10; 2-week follow-up. The area on the right second toe dorsally is healed. Per the patient the left first toe remains healed. He still has an extensive area dorsally over the right first toe inner phalangeal joint area. Rolled up hyper granulated tissue with probably [name] nonviable surface 4/24; 2-week follow-up. The areas on the right second toe dorsally and right first toe dorsally is healed. The area on the dorsal left first toe over the inner phalangeal joint is smaller but still hyper granulated we have been using Hydrofera Blue 5/15; 3-week follow-up. The areas on the right first toe. Not the left as I said on 4/24. He is using Hydrofera Blue. 5/29; 3-week follow-up. The area on his dorsal right toe over the right inter phalangeal joint is just about closed. We have been using Hydrofera Blue. In passing he showed me his right thumb which is exquisitely tender over the tip of the digit. 6/5; the area on the dorsal right toe over the inner phalangeal joint is closed. Right thumb is better which I gave him antibiotics for last week. Still some tenderness but considerably improved READMISSION 02/22/2022 This is [name] 67 year old type II diabetic with congestive heart failure, end-stage renal disease on hemodialysis, peripheral artery disease, and morbid obesity. He has developed an ulcer on his left posterior heel  that he believes is secondary to his health assistance not adequately moisturizing his feet. He says it first appeared about 2 weeks ago. He does not have sensation in his feet. He has not noticed any foul odor or drainage. His last hemoglobin A1c was 7.9 at the beginning of January. ABI in clinic today was 1.15. 03/09/2022: He has [name] new wound on his abdomen that he says  started out as [name] lump and then the skin broke open when he bumped it on [name] portion of his hospital bed at home. There is fat exposed. The wound on his heel is [name] little bit smaller but still has [name] very pale surface. There is periwound callus accumulation. 03/30/2022: He had another small wound opened up on his abdomen. He says that he thinks these are starting as blisters. He has been on Mounjaro for weight loss and it looks like the loose skin resulting from losing weight might be rubbing and causing small friction injuries. The wound that I treated with chemical cauterization last visit is much smaller; the new wound has [name] very similar appearance to this. His heel ulcer is smaller with just [name] little bit of slough buildup on the surface. He continues to have fairly impressive periwound callus formation. 04/07/2022: Both abdominal wounds are smaller today and there is less hypertrophic granulation tissue protruding. The wound on his heel also continues to contract. There is just [name] layer of slough on the surface. No concern for infection. 04/29/2022: The previous 2 abdominal wounds have closed but he has [name] new 1 that appears identical to the previous lesions. The wound on his heel is smaller but has [name] thick layer of callus overlying it. 05/13/2022: His heel wound has healed. The wound on his abdomen remains small but still has some hypertrophic granulation tissue present. He says it drains [place], [place] ([id number]) [id number] Page 6 of 10 serous fluid from time to time. READMISSION 08/18/2022: [name] returns with reopening of the small wound on his abdomen. He said it closed and then he recently went swimming and later that day, he noted some serosanguineous drainage on his shirt. He has not had any fevers or chills. The drainage has never been purulent or foul-smelling. As per his previous admission, the wound is small and circular with fat exposed. No concern for  infection. 12/14; the patient's original wound that he came in with has healed and is epithelialized however just below this he has another open area. He noticed this 3 days ago with [name] significant amount of sanguinous drainage. No pain no obvious purulence. 09/07/2022: The wound is shallower this week. No purulent drainage identified. The culture that was taken grew out just [name] small amount of Staph aureus pansensitive except to ciprofloxacin. No antibiotic was prescribed by our clinic, but apparently the patient's primary care provider prescribed an antibiotic, but he does not recall the name and it is not available in the electronic medical record. 09/29/2022: Apparently the patient's home health providers have not been packing the wound. It now probes to [name] depth of 2.4 cm. It is clean without any slough or purulent drainage. No concern for infection. 10/24/2022: No real change to the wound. There is still about the same amount of depth. The orifice is narrow and this seems to be complicating appropriate packing of the site. Patient History Information obtained from Patient. Family History Cancer - Mother,Father, Diabetes - Mother,Father,Siblings, Heart Disease - Siblings, Lung Disease - Mother, No family history of Hereditary Spherocytosis,  Hypertension, Kidney Disease, Seizures, Stroke, Thyroid Problems, Tuberculosis. Social History Never smoker, Marital Status - Single, Alcohol Use - Never, Drug Use - No History, Caffeine Use - Daily - Coffee. Medical History Eyes Denies history of Cataracts, Glaucoma, Optic Neuritis Ear/Nose/Mouth/Throat Denies history of Chronic sinus problems/congestion, Middle ear problems Hematologic/Lymphatic Denies history of Anemia, Hemophilia, Human Immunodeficiency Virus, Lymphedema, Sickle Cell Disease Respiratory Patient has history of Sleep Apnea Denies history of Aspiration, Asthma, Chronic Obstructive Pulmonary Disease (COPD), Pneumothorax,  Tuberculosis Cardiovascular Patient has history of Congestive Heart Failure, Hypertension Denies history of Angina, Arrhythmia, Coronary Artery Disease, Deep Vein Thrombosis, Hypotension, Myocardial Infarction, Peripheral Arterial Disease, Peripheral Venous Disease, Phlebitis, Vasculitis Gastrointestinal Denies history of Cirrhosis , Colitis, Crohnoos, Hepatitis [name], Hepatitis B, Hepatitis C Endocrine Patient has history of Type II Diabetes Denies history of Type I Diabetes Genitourinary Denies history of End Stage Renal Disease Immunological Denies history of Lupus Erythematosus, Raynaudoos, Scleroderma Integumentary (Skin) Denies history of History of Burn Musculoskeletal Patient has history of Osteoarthritis Denies history of Gout, Rheumatoid Arthritis, Osteomyelitis Neurologic Patient has history of Neuropathy - Diabetic Denies history of Dementia, Quadriplegia, Paraplegia, Seizure Disorder Oncologic Denies history of Received Chemotherapy, Received Radiation Psychiatric Denies history of Anorexia/bulimia, Confinement Anxiety Objective Constitutional no acute distress. Vitals Time Taken: 10:16 AM, Height: 72 in, Weight: 400 lbs, BMI: 54.2, Temperature: 98.1 F, Pulse: 68 bpm, Respiratory Rate: 18 breaths/min, Blood Pressure: 114/67 mmHg. Respiratory Normal work of breathing on room air. General Notes: 10/24/2022: No real change to the wound. There is still about the same amount of depth. The orifice is narrow and this seems to be complicating appropriate packing of the site. [name], [name] ([id number]) [id number] Page 7 of 10 Integumentary (Hair, Skin) Wound #24 status is Open. Original cause of wound was Gradually Appeared. The date acquired was: 08/29/2022. The wound has been in treatment 7 weeks. The wound is located on the Distal Abdomen - midline. The wound measures 0.3cm length x 0.2cm width x 2.3cm depth; 0.047cm^2 area and  0.108cm^3 volume. There is Fat Layer (Subcutaneous Tissue) exposed. There is no tunneling or undermining noted. There is [name] medium amount of serosanguineous drainage noted. The wound margin is distinct with the outline attached to the wound base. There is large (67-100%) red granulation within the wound bed. There is no necrotic tissue within the wound bed. The periwound skin appearance had no abnormalities noted for texture. The periwound skin appearance had no abnormalities noted for moisture. The periwound skin appearance had no abnormalities noted for color. Periwound temperature was noted as No Abnormality. 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 An Chemical Cauterization procedure was performed by Fredirick Maudlin, MD. Post procedure Diagnosis Wound #24: Same as Pre-Procedure Notes: scribed for Dr. Celine Ahr by Adline Peals, RN Plan Follow-up Appointments: Return Appointment in 2 weeks. - Dr. Alvera Singh 2 Bathing/ Shower/ Hygiene: May shower and wash wound with soap and water. - Change dressing every other day Home Health: New wound care orders this week; continue Home Health for wound care. May utilize formulary equivalent dressing for wound treatment orders unless otherwise specified. - THERE IS DEPTH TO THE WOUND 2.3 CM PLEASE PACK THE WOUND USING [name] SKINNY PROBE TO ADEQUATELY GET  THE DRESSING IN THE WOUND Dressing changes to be completed by [place] on Monday / Wednesday / Friday except when patient has scheduled visit at [facility]. Other Home Health Orders/Instructions: - [id number] WOUND #24: - Abdomen -  midline Wound Laterality: Distal Cleanser: Soap and Water 3 x Per Week/30 Days Discharge Instructions: May shower and wash wound with dial antibacterial soap and water prior to dressing change. Prim Dressing: Iodoform packing strip 1/4 (in) 3 x Per Week/30 Days ary Discharge Instructions: Lightly pack as instructed Secondary Dressing: Zetuvit Plus Silicone Border Dressing 4x4 (in/in) 3 x Per Week/30 Days Discharge Instructions: Apply silicone border over primary dressing as directed. Add-Ons: Gloves, Large 3 x Per Week/30 Days 10/24/2022: No real change to the wound. There is still about the same amount of depth. The orifice is narrow and this seems to be complicating appropriate packing of the site. I used [name] hemostat to stretch the opening to the wound. I then chemically cauterized the tract with silver nitrate, hoping this will encourage it to scar in. We will continue to pack the site with iodoform packing strips. Follow-up in 2 weeks. Electronic Signature(s) Signed: 10/25/2022 8:24:34 AM By: Fredirick Maudlin MD FACS Previous Signature: 10/24/2022 10:40:08 AM Version By: Fredirick Maudlin MD FACS Entered By: Fredirick Maudlin on 10/25/2022 08:24:34 [name], [name] ([id number]) [id number] Page 8 of 10 -------------------------------------------------------------------------------- HxROS Details Patient Name: Date of Service: AADI, BORDNER 10/24/2022 10:00 [name] M Medical Record Number: [id number] Patient Account Number: [id number] Date of Birth/Sex: Treating RN: 04/05/1956 (67 y.o. M) Primary Care Provider: [name] Other Clinician: Referring Provider: Treating Provider/Extender: [name] in Treatment: 9 Information Obtained From Patient Eyes Medical History: Negative for: Cataracts; Glaucoma; Optic Neuritis Ear/Nose/Mouth/Throat Medical History: Negative for: Chronic sinus problems/congestion; Middle ear  problems Hematologic/Lymphatic Medical History: Negative for: Anemia; Hemophilia; Human Immunodeficiency Virus; Lymphedema; Sickle Cell Disease Respiratory Medical History: Positive for: Sleep Apnea Negative for: Aspiration; Asthma; Chronic Obstructive Pulmonary Disease (COPD); Pneumothorax; Tuberculosis Cardiovascular Medical History: Positive for: Congestive Heart Failure; Hypertension Negative for: Angina; Arrhythmia; Coronary Artery Disease; Deep Vein Thrombosis; Hypotension; Myocardial Infarction; Peripheral Arterial Disease; Peripheral Venous Disease; Phlebitis; Vasculitis Gastrointestinal Medical History: Negative for: Cirrhosis ; Colitis; Crohns; Hepatitis [name]; Hepatitis B; Hepatitis C Endocrine Medical History: Positive for: Type II Diabetes Negative for: Type I Diabetes Time with diabetes: 1994 Treated with: Insulin Blood sugar tested every day: No Genitourinary Medical History: Negative for: End Stage Renal Disease Immunological Medical History: Negative for: Lupus Erythematosus; Raynauds; Scleroderma Integumentary (Skin) Medical History: Negative for: History of Burn Musculoskeletal Medical HistoryRASHAUD, YBARBO ([id number]) [id number] Page 9 of 10 Positive for: Osteoarthritis Negative for: Gout; Rheumatoid Arthritis; Osteomyelitis Neurologic Medical History: Positive for: Neuropathy - Diabetic Negative for: Dementia; Quadriplegia; Paraplegia; Seizure Disorder Oncologic Medical History: Negative for: Received Chemotherapy; Received Radiation Psychiatric Medical History: Negative for: Anorexia/bulimia; Confinement Anxiety Immunizations Pneumococcal Vaccine: Received Pneumococcal Vaccination: No Implantable Devices No devices added Family and Social History Cancer: Yes - Mother,Father; Diabetes: Yes - Mother,Father,Siblings; Heart Disease: Yes - Siblings; Hereditary Spherocytosis: No; Hypertension: No; Kidney Disease: No;  Lung Disease: Yes - Mother; Seizures: No; Stroke: No; Thyroid Problems: No; Tuberculosis: No; Never smoker; Marital Status - Single; Alcohol Use: Never; Drug Use: No History; Caffeine Use: Daily - Coffee; Financial Concerns: No; Food, Clothing or Shelter Needs: No; Support System Lacking: No; Transportation Concerns: No Electronic Signature(s) Signed: 10/24/2022 11:21:54 AM By: Fredirick Maudlin MD FACS Entered By: Fredirick Maudlin on 10/24/2022 10:38:48 -------------------------------------------------------------------------------- SuperBill Details Patient Name:  Date of Service: DELLAS, GUARD 10/24/2022 Medical Record Number: [id number] Patient Account Number: [id number] Date of Birth/Sex: Treating RN: 1956/01/05 (67 y.o. M) Primary Care Provider: [name] Other Clinician: Referring Provider: Treating Provider/Extender: [name] in Treatment: 9 Diagnosis Coding ICD-10 Codes Code Description L98.499 Non-pressure chronic ulcer of skin of other sites with unspecified severity E66.2 Morbid (severe) obesity with alveolar hypoventilation I73.9 Peripheral vascular disease, unspecified I62.70 Chronic diastolic (congestive) heart failure E11.622 Type 2 diabetes mellitus with other skin ulcer Facility Procedures : CPT4 Code: 35009381 Description: 82993 - CHEM CAUT GRANULATION TISS ICD-10 Diagnosis Description L98.499 Non-pressure chronic ulcer of skin of other sites with unspecified severity Modifier: Quantity: 1 Physician Procedures ZYMARION, FAVORITE ([id number]): CPT4 Code Description [id number] - WC PHYS LEVEL 4 - EST PT ICD-10 Diagnosis Description L98.499 Non-pressure chronic ulcer of skin of other sites with unspeci E66.2 Morbid (severe) obesity with alveolar hypoventilation  E11.622 Type 2 diabetes mellitus with other skin ulcer I77.82 Chronic diastolic (congestive) heart failure [id number] Page 10 of 10: Quantity Modifier  25 1 fied severity OVERBECK, Benzion ([id number]): [id number] - WC PHYS CHEM CAUT GRAN TISSUE ICD-10 Diagnosis Description L98.499 Non-pressure chronic ulcer of skin of other sites with unspeci [id number] Page 10 of 10: 1 fied severity Electronic Signature(s) Signed: 10/24/2022 10:40:32 AM By: Fredirick Maudlin MD FACS Entered By: Fredirick Maudlin on 10/24/2022 10:40:32

## 2022-10-25 NOTE — Progress Notes (Signed)
[name], Martavion ([id number]) [id number] Page 1 of 6 Visit Report for 10/24/2022 Arrival Information Details Patient Name: Date of Service: [name] 10/24/2022 10:00 A M Medical Record Number: [id number] Patient Account Number: [id number] Date of Birth/Sex: Treating RN: January 07, 1956 (67 y.o. [name] Primary Care [name]: [name] Other Clinician: Referring [name]: Treating [name]/Extender: [name] in Treatment: 9 Visit Information History Since Last Visit Added or deleted any medications: No Patient Arrived: Wheel Chair Any new allergies or adverse reactions: No Arrival Time: 10:15 Had a fall or experienced change in No Accompanied By: self activities of daily living that may affect Transfer Assistance: None risk of falls: Patient Identification Verified: Yes Signs or symptoms of abuse/neglect since last visito No Secondary Verification Process Completed: Yes Hospitalized since last visit: No Implantable device outside of the clinic excluding No cellular tissue based products placed in the center since last visit: Has Dressing in Place as Prescribed: Yes Pain Present Now: No Electronic Signature(s) Signed: 10/24/2022 4:26:52 PM By: Adline Peals Entered By: Adline Peals on 10/24/2022 10:15:49 -------------------------------------------------------------------------------- Encounter Discharge Information Details Patient Name: Date of Service: [name], [name] 10/24/2022 10:00 [name] Record Number: [id number] Patient Account Number: [id number] Date of Birth/Sex: Treating RN: 1955-11-02 (67 y.o. [name] Primary Care Nicolis Boody: [name] Other Clinician: Referring Patrizia Paule: Treating [name]/Extender: [name] in Treatment: 9 Encounter Discharge Information Items Discharge Condition: Stable Ambulatory Status: Wheelchair Discharge  Destination: Home Transportation: Private Auto Accompanied By: self Schedule Follow-up Appointment: Yes Clinical Summary of Care: Patient Declined Electronic Signature(s) Signed: 10/24/2022 4:26:52 PM By: Adline Peals Entered By: Adline Peals on 10/24/2022 10:36:35 [name] ([id number]) [id number] Page 2 of 6 -------------------------------------------------------------------------------- Lower Extremity Assessment Details Patient Name: Date of Service: [name], [name] 10/24/2022 10:00 A M Medical Record Number: [id number] Patient Account Number: [id number] Date of Birth/Sex: Treating RN: 1956/03/06 (67 y.o. [name] Primary Care Perri Lamagna: [name] Other Clinician: Referring Rashan Patient: Treating [name]/Extender: [name] Weeks in Treatment: 9 Electronic Signature(s) Signed: 10/24/2022 4:26:52 PM By: Adline Peals Entered By: Adline Peals on 10/24/2022 10:17:27 -------------------------------------------------------------------------------- Multi Wound Chart Details Patient Name: Date of Service: [name], [name] 10/24/2022 10:00 [name] Record Number: [id number] Patient Account Number: [id number] Date of Birth/Sex: Treating RN: [birth year] (66 y.o. M) Primary Care Lexus Shampine: [name] Other Clinician: Referring Yamilette Garretson: Treating Nohealani Medinger/Extender: [name] in Treatment: 9 Vital Signs Height(in): 72 Pulse(bpm): 68 Weight(lbs): 400 Blood Pressure(mmHg): 114/67 Body Mass Index(BMI): 54.2 Temperature(F): 98.1 Respiratory Rate(breaths/min): 18 [24:Photos:] [N/A:N/A] Distal Abdomen - midline N/A N/A Wound Location: Gradually Appeared N/A N/A Wounding Event: Lesion N/A N/A Primary Etiology: Sleep Apnea, Congestive Heart N/A N/A Comorbid History: Failure, Hypertension, Type II Diabetes, Osteoarthritis, Neuropathy 08/29/2022 N/A N/A Date  Acquired: 7 N/A N/A Weeks of Treatment: Open N/A N/A Wound Status: No N/A N/A Wound Recurrence: 0.3x0.2x2.3 N/A N/A Measurements L x W x D (cm) 0.047 N/A N/A A (cm) : rea 0.108 N/A N/A Volume (cm) : -51.60% N/A N/A % Reduction in Area: 36.50% N/A N/A % Reduction in Volume: Full Thickness Without Exposed N/A N/A Classification: Support Structures Medium N/A N/A Exudate Amount: Serosanguineous N/A N/A Exudate Type: red, brown N/A N/A Exudate Color: Distinct, outline attached N/A N/A Wound Margin: Large (67-100%) N/A N/A Granulation Amount: [name] ([id number]) [id number] Page 3 of 6 Red N/A N/A Granulation Quality: None Present (0%) N/A N/A Necrotic Amount: Fat Layer (Subcutaneous Tissue): Yes N/A N/A Exposed Structures: Fascia:  No Tendon: No Muscle: No Joint: No Bone: No Small (1-33%) N/A N/A Epithelialization: No Abnormalities Noted N/A N/A Periwound Skin Texture: No Abnormalities Noted N/A N/A Periwound Skin Moisture: No Abnormalities Noted N/A N/A Periwound Skin Color: No Abnormality N/A N/A Temperature: Chemical Cauterization N/A N/A Procedures Performed: Treatment Notes Electronic Signature(s) Signed: 10/24/2022 10:29:40 AM By: Fredirick Maudlin MD FACS Entered By: Fredirick Maudlin on 10/24/2022 10:29:40 -------------------------------------------------------------------------------- Multi-Disciplinary Care Plan Details Patient Name: Date of Service: [name], [name] 10/24/2022 10:00 A M Medical Record Number: [id number] Patient Account Number: [id number] Date of Birth/Sex: Treating RN: 12-16-1955 (67 y.o. [name] Primary Care Phylis Javed: [name] Other Clinician: Referring Jhoana Upham: Treating Cordarrel Stiefel/Extender: [name] in Treatment: 9 Active Inactive Abuse / Safety / Falls / Self Care Management Nursing Diagnoses: Impaired physical mobility Potential for  falls Goals: Patient/caregiver will verbalize/demonstrate measure taken to improve self care Date Initiated: 10/24/2022 Target Resolution Date: 12/23/2022 Goal Status: Active Patient/caregiver will verbalize/demonstrate measures taken to improve the patient's personal safety Date Initiated: 10/24/2022 Target Resolution Date: 12/23/2022 Goal Status: Active Interventions: Provide education on fall prevention Notes: Electronic Signature(s) Signed: 10/24/2022 4:26:52 PM By: Adline Peals Entered By: Adline Peals on 10/24/2022 10:36:01 -------------------------------------------------------------------------------- Pain Assessment Details Patient Name: Date of Service: [name], [name] 10/24/2022 10:00 [employer] [place] ([id number]) [id number] Page 4 of 6 Medical Record Number: [id number] Patient Account Number: [id number] Date of Birth/Sex: Treating RN: 1955-12-23 (67 y.o. [name] Primary Care Lerline Valdivia: [name] Other Clinician: Referring Kayli Beal: Treating Kadin Canipe/Extender: [name] in Treatment: 9 Active Problems Location of Pain Severity and Description of Pain Patient Has Paino No Site Locations Rate the pain. Current Pain Level: 0 Pain Management and Medication Current Pain Management: Electronic Signature(s) Signed: 10/24/2022 4:26:52 PM By: Adline Peals Entered By: Adline Peals on 10/24/2022 10:16:49 -------------------------------------------------------------------------------- Patient/Caregiver Education Details Patient Name: Date of Service: [name], [name] 2/5/2024andnbsp10:00 [place] Record Number: [id number] Patient Account Number: [id number] Date of Birth/Gender: Treating RN: Nov 03, 1955 (67 y.o. [name] Primary Care Physician: [name] Other Clinician: Referring Physician: Treating Physician/Extender: [name] in Treatment:  9 Education Assessment Education Provided To: Patient Education Topics Provided Safety: Methods: Explain/Verbal Responses: Reinforcements needed, State content correctly Electronic Signature(s) Signed: 10/24/2022 4:26:52 PM By: Adline Peals Entered By: Adline Peals on 10/24/2022 10:36:13 [place], [name] ([id number]) [id number] Page 5 of 6 -------------------------------------------------------------------------------- Wound Assessment Details Patient Name: Date of Service: [name], [name] 10/24/2022 10:00 A M Medical Record Number: [id number] Patient Account Number: [id number] Date of Birth/Sex: Treating RN: 25-Jul-1956 (67 y.o. [name] Primary Care Linnie Delgrande: [name] Other Clinician: Referring Bronson Bressman: Treating Vaishnav Demartin/Extender: [name] in Treatment: 9 Wound Status Wound Number: 24 Primary Lesion Etiology: Wound Location: Distal Abdomen - midline Wound Open Wounding Event: Gradually Appeared Status: Date Acquired: 08/29/2022 Comorbid Sleep Apnea, Congestive Heart Failure, Hypertension, Type II Weeks Of Treatment: 7 History: Diabetes, Osteoarthritis, Neuropathy Clustered Wound: No Photos Wound Measurements Length: (cm) 0.3 Width: (cm) 0.2 Depth: (cm) 2.3 Area: (cm) 0.047 Volume: (cm) 0.108 % Reduction in Area: -51.6% % Reduction in Volume: 36.5% Epithelialization: Small (1-33%) Tunneling: No Undermining: No Wound Description Classification: Full Thickness Without Exposed Support Structures Wound Margin: Distinct, outline attached Exudate Amount: Medium Exudate Type: Serosanguineous Exudate Color: red, brown Foul Odor After Cleansing: No Slough/Fibrino No Wound Bed Granulation Amount: Large (67-100%) Exposed Structure Granulation Quality: Red Fascia Exposed: No Necrotic Amount: None Present (0%) Fat Layer (Subcutaneous Tissue) Exposed:  Yes Tendon Exposed: No Muscle  Exposed: No Joint Exposed: No Bone Exposed: No Periwound Skin Texture Texture Color No Abnormalities Noted: Yes No Abnormalities Noted: Yes Moisture Temperature / Pain No Abnormalities Noted: Yes Temperature: No Abnormality Treatment Notes Wound #24 (Abdomen - midline) Wound Laterality: Distal Cleanser Soap and Water Discharge Instruction: May shower and wash wound with dial antibacterial soap and water prior to dressing change. [name], [name] ([id number]) [id number] Page 6 of 6 Peri-Wound Care Topical Primary Dressing Iodoform packing strip 1/4 (in) Discharge Instruction: Lightly pack as instructed Secondary Dressing Zetuvit Plus Silicone Border Dressing 4x4 (in/in) Discharge Instruction: Apply silicone border over primary dressing as directed. Secured With Compression Wrap Compression Stockings Add-Ons Gloves, Art gallery manager) Signed: 10/24/2022 4:26:52 PM By: Adline Peals Entered By: Adline Peals on 10/24/2022 10:20:53 -------------------------------------------------------------------------------- Vitals Details Patient Name: Date of Service: [name], Romy 10/24/2022 10:00 A M Medical Record Number: [id number] Patient Account Number: [id number] Date of Birth/Sex: Treating RN: [birth year] (67 y.o. [name] Primary Care Jameel Quant: [name] Other Clinician: Referring Madasyn Heath: Treating Hargun Spurling/Extender: [name] in Treatment: 9 Vital Signs Time Taken: 10:16 Temperature (F): 98.1 Height (in): 72 Pulse (bpm): 68 Weight (lbs): 400 Respiratory Rate (breaths/min): 18 Body Mass Index (BMI): 54.2 Blood Pressure (mmHg): 114/67 Reference Range: 80 - 120 mg / dl Electronic Signature(s) Signed: 10/24/2022 4:26:52 PM By: Adline Peals Entered By: Adline Peals on 10/24/2022 10:17:23

## 2022-11-03 ENCOUNTER — Other Ambulatory Visit: Payer: Self-pay | Admitting: Nurse Practitioner

## 2022-11-03 ENCOUNTER — Ambulatory Visit
Admission: RE | Admit: 2022-11-03 | Discharge: 2022-11-03 | Disposition: A | Discharge status: Home / Self Care | Payer: Medicare PPO | Source: Ambulatory Visit | Attending: Nurse Practitioner | Admitting: Nurse Practitioner

## 2022-11-03 DIAGNOSIS — W19XXXA Unspecified fall, initial encounter: Secondary | ICD-10-CM

## 2022-11-03 DIAGNOSIS — R053 Chronic cough: Secondary | ICD-10-CM

## 2022-11-04 ENCOUNTER — Other Ambulatory Visit: Payer: Self-pay | Admitting: Cardiology

## 2022-11-04 DIAGNOSIS — E662 Morbid (severe) obesity with alveolar hypoventilation: Secondary | ICD-10-CM

## 2022-11-04 DIAGNOSIS — G4733 Obstructive sleep apnea (adult) (pediatric): Secondary | ICD-10-CM

## 2022-11-04 DIAGNOSIS — G4736 Sleep related hypoventilation in conditions classified elsewhere: Secondary | ICD-10-CM

## 2022-11-04 NOTE — Telephone Encounter (Signed)
Patient notified of sleep study results and recommendations. He agrees to proceed with CPAP titration pending insurance approval.

## 2022-11-04 NOTE — Telephone Encounter (Signed)
-----   Message from Sueanne Margarita, MD sent at 10/15/2022  4:05 PM EST ----- Please let patient know that they have sleep apnea.  Recommend therapeutic CPAP titration for treatment of patient's sleep disordered breathing.  If unable to perform an in lab titration then initiate ResMed auto CPAP from 4 to 15cm H2O with heated humidity and mask of choice and overnight pulse ox on CPAP.

## 2022-11-07 ENCOUNTER — Encounter (HOSPITAL_BASED_OUTPATIENT_CLINIC_OR_DEPARTMENT_OTHER): Payer: Medicare PPO | Admitting: General Surgery

## 2022-11-07 DIAGNOSIS — E11622 Type 2 diabetes mellitus with other skin ulcer: Secondary | ICD-10-CM | POA: Diagnosis not present

## 2022-11-07 NOTE — Progress Notes (Signed)
[name], Avish ([id number]) [id number] Page 1 of 10 Visit Report for 11/07/2022 Chief Complaint Document Details Patient Name: Date of Service: [name], [name] 11/07/2022 9:15 A M Medical Record Number: [id number] Patient Account Number: [id number] Date of Birth/Sex: Treating RN: Dec 30, 1955 (67 y.o. M) Primary Care Provider: [name] Other Clinician: Referring Provider: Treating Provider/Extender: [name] in Treatment: 11 Information Obtained from: Patient Chief Complaint 11/09/2018; patient is here for review of wounds on his dorsal right first and second toes and on the dorsal left first toe. 02/22/2022: The patient is here for a new ulcer on his posterior left heel. 08/18/2022: here with new wound on abdomen, similar to prior Electronic Signature(s) Signed: 11/07/2022 9:38:05 AM By: Fredirick Maudlin MD FACS Entered By: Fredirick Maudlin on 11/07/2022 09:38:05 -------------------------------------------------------------------------------- HPI Details Patient Name: Date of Service: [name], [name] 11/07/2022 9:15 A M Medical Record Number: [id number] Patient Account Number: [id number] Date of Birth/Sex: Treating RN: 05-26-1956 (67 y.o. M) Primary Care Provider: [name] Other Clinician: Referring Provider: Treating Provider/Extender: [name] in Treatment: 11 History of Present Illness HPI Description: READMISSION 11/09/2018 This is a now 67 year old man that we had in this clinic over a multitude of years but most recently in 2015. He had wounds on his plantar foot initially in 2007 and 2008 and I think subsequently was seen in 2012 then 2014 into the mid part of 2015 with wounds on his toes. He is a type II diabetic with peripheral neuropathy but no known PAD. The patient states he was doing well until about a month ago he noted an area on the left great toe which was superficial and then  an area on the right great and second toes about a week later. He is not sure how this happened he simply noticed this when he was lying in bed at night. He has been using peroxide. He has not seen a doctor. He has not been on antibiotics and has had no x-rays. Past medical history; type 2 diabetes with peripheral neuropathy, hypertension, hypothyroidism, low B12 levels, iron deficiency anemia, ventral hernia, diabetic nephropathy, chronic diastolic heart failure and obstructive sleep apnea. ABIs in this clinic were 1.2 on the right and 1.1 on the left 2/28; patient arrived last week for areas on his left first right first and right second toes. X-ray of the right foot did not show any osseous abnormality. Culture of the right great toe showed Staphylococcus aureus methicillin sensitive and he is on dicloxacillin which he started 3 days ago. 3/6; patient with wounds on his right first and second toes and left first toes. The area over the left first toe is extensive we have been using silver alginate. Culture grew MSSA and he is on dicloxacillin which I prescribed on the 25th. He is still taking this which I am not really sure of the reason. He expressed knowledge that this was 4 times a day he should have been out of this around March 2 if he picked it up on the 25th or they gave him the right number of pills. Nevertheless our nurses report more purulent drainage and odor 3/17-Patient returns for the 3 wounds the left first toe and the right first and second toe dorsal surfaces. The left first toe is closed up and healed, the right first toe dorsal surface wound is extensive and appears the same as last time, no purulent drainage or odor reported per nurse, left second toe wound is slightly smaller. He  has completed the dicloxacillin as of today. Noted that his culture previously had grown MSSA. We have been using calcium alginate to the wounds. X-rays have been reviewed. ABIs were reviewed as  well. 3/27; patient returns for review of wounds on his right first and right second toes. The wound on the left first toe is healed. [name], [name] ([id number]) [id number] Page 2 of 10 4/10; 2-week follow-up. The area on the right second toe dorsally is healed. Per the patient the left first toe remains healed. He still has an extensive area dorsally over the right first toe inner phalangeal joint area. Rolled up hyper granulated tissue with probably a nonviable surface 4/24; 2-week follow-up. The areas on the right second toe dorsally and right first toe dorsally is healed. The area on the dorsal left first toe over the inner phalangeal joint is smaller but still hyper granulated we have been using Hydrofera Blue 5/15; 3-week follow-up. The areas on the right first toe. Not the left as I said on 4/24. He is using Hydrofera Blue. 5/29; 3-week follow-up. The area on his dorsal right toe over the right inter phalangeal joint is just about closed. We have been using Hydrofera Blue. In passing he showed me his right thumb which is exquisitely tender over the tip of the digit. 6/5; the area on the dorsal right toe over the inner phalangeal joint is closed. Right thumb is better which I gave him antibiotics for last week. Still some tenderness but considerably improved READMISSION 02/22/2022 This is a 67 year old type II diabetic with congestive heart failure, end-stage renal disease on hemodialysis, peripheral artery disease, and morbid obesity. He has developed an ulcer on his left posterior heel that he believes is secondary to his health assistance not adequately moisturizing his feet. He says it first appeared about 2 weeks ago. He does not have sensation in his feet. He has not noticed any foul odor or drainage. His last hemoglobin A1c was 7.9 at the beginning of January. ABI in clinic today was 1.15. 03/09/2022: He has a new wound on his abdomen that he says started out  as a lump and then the skin broke open when he bumped it on a portion of his hospital bed at home. There is fat exposed. The wound on his heel is a little bit smaller but still has a very pale surface. There is periwound callus accumulation. 03/30/2022: He had another small wound opened up on his abdomen. He says that he thinks these are starting as blisters. He has been on Mounjaro for weight loss and it looks like the loose skin resulting from losing weight might be rubbing and causing small friction injuries. The wound that I treated with chemical cauterization last visit is much smaller; the new wound has a very similar appearance to this. His heel ulcer is smaller with just a little bit of slough buildup on the surface. He continues to have fairly impressive periwound callus formation. 04/07/2022: Both abdominal wounds are smaller today and there is less hypertrophic granulation tissue protruding. The wound on his heel also continues to contract. There is just a layer of slough on the surface. No concern for infection. 04/29/2022: The previous 2 abdominal wounds have closed but he has a new 1 that appears identical to the previous lesions. The wound on his heel is smaller but has a thick layer of callus overlying it. 05/13/2022: His heel wound has healed. The wound on his abdomen remains small but still has some hypertrophic  granulation tissue present. He says it drains serous fluid from time to time. READMISSION 08/18/2022: Mr. Thoreson returns with reopening of the small wound on his abdomen. He said it closed and then he recently went swimming and later that day, he noted some serosanguineous drainage on his shirt. He has not had any fevers or chills. The drainage has never been purulent or foul-smelling. As per his previous admission, the wound is small and circular with fat exposed. No concern for infection. 12/14; the patient's original wound that he came in with has healed and is  epithelialized however just below this he has another open area. He noticed this 3 days ago with a significant amount of sanguinous drainage. No pain no obvious purulence. 09/07/2022: The wound is shallower this week. No purulent drainage identified. The culture that was taken grew out just a small amount of Staph aureus pansensitive except to ciprofloxacin. No antibiotic was prescribed by our clinic, but apparently the patient's primary care provider prescribed an antibiotic, but he does not recall the name and it is not available in the electronic medical record. 09/29/2022: Apparently the patient's home health providers have not been packing the wound. It now probes to a depth of 2.4 cm. It is clean without any slough or purulent drainage. No concern for infection. 10/24/2022: No real change to the wound. There is still about the same amount of depth. The orifice is narrow and this seems to be complicating appropriate packing of the site. 11/07/2022: The existing wound closed and the previous one reopened. It probes to 2.7 cm. No purulent drainage or malodor. Electronic Signature(s) Signed: 11/07/2022 9:39:06 AM By: Fredirick Maudlin MD FACS Entered By: Fredirick Maudlin on 11/07/2022 09:39:06 -------------------------------------------------------------------------------- Physical Exam Details Patient Name: Date of Service: [name], [name] 11/07/2022 9:15 A M Medical Record Number: [id number] Patient Account Number: [id number] Date of Birth/Sex: Treating RN: [birth year] (67 y.o. M) Primary Care Provider: [name] Other Clinician: Referring Provider: Treating Provider/Extender: [name] in Treatment: 11 Constitutional . . . . no acute distress. Respiratory Normal work of breathing on room air. Notes DHANI, FAHRER ([id number]) [id number] Page 3 of 10 11/07/2022: The existing wound closed and the previous one reopened. It probes to  2.7 cm. No purulent drainage or malodor. Electronic Signature(s) Signed: 11/07/2022 9:39:38 AM By: Fredirick Maudlin MD FACS Entered By: Fredirick Maudlin on 11/07/2022 09:39:38 -------------------------------------------------------------------------------- Physician Orders Details Patient Name: Date of Service: [name], [name] 11/07/2022 9:15 A M Medical Record Number: [id number] Patient Account Number: [id number] Date of Birth/Sex: Treating RN: 06/08/1956 (67 y.o. [name] Primary Care Provider: [name] Other Clinician: Referring Provider: Treating Provider/Extender: [name] in Treatment: 11 Verbal / Phone Orders: No Diagnosis Coding ICD-10 Coding Code Description L98.499 Non-pressure chronic ulcer of skin of other sites with unspecified severity E66.2 Morbid (severe) obesity with alveolar hypoventilation I73.9 Peripheral vascular disease, unspecified 99991111 Chronic diastolic (congestive) heart failure E11.622 Type 2 diabetes mellitus with other skin ulcer Follow-up Appointments ppointment in 2 weeks. - Dr. Celine Ahr Rm 2 Return A Bathing/ Shower/ Hygiene May shower and wash wound with soap and water. - Change dressing every other day [place] wound care orders this week; continue [place] for wound care. May utilize formulary equivalent dressing for wound treatment orders unless otherwise specified. - THERE IS DEPTH TO THE WOUND 2.7 CM PLEASE PACK THE WOUND USING A SKINNY PROBE TO ADEQUATELY GET THE DRESSING IN THE WOUND Dressing changes to be completed  by Home Health on Monday / Wednesday / Friday except when patient has scheduled visit at [facility]. Other Home Health Orders/Instructions: - [facility] Wound Treatment Wound #25 - Abdomen - midline Wound Laterality: [place] Cleanser: Soap and Water Discharge Instructions: May shower and wash wound with dial antibacterial soap and water prior to dressing  change. Cleanser: Wound Cleanser Discharge Instructions: Cleanse the wound with wound cleanser prior to applying a clean dressing using gauze sponges, not tissue or cotton balls. Prim Dressing: Iodoform packing strip 1/4 (in) ary Discharge Instructions: Lightly pack as instructed Secondary Dressing: Zetuvit Plus Silicone Border Dressing 4x4 (in/in) Discharge Instructions: Apply silicone border over primary dressing as directed. [facility] - atypical nonhealing wounds in presence of lap band port ICD10: E11.622 Electronic Signature(s) Signed: 11/07/2022 9:40:01 AM By: Fredirick Maudlin MD FACS Entered By: Fredirick Maudlin on 11/07/2022 09:40:00 [name], Cher ([id number]) [id number].pdf Page 4 of 10 Prescription 11/07/2022 -------------------------------------------------------------------------------- [name] Roads MD Patient Name: Provider: 10-12-1955 [id number] Date of Birth: NPI#: Jerilynn Mages [id number] Sex: DEA #: [phone] [id number] Phone #: License #: [place] Patient Address: [place] [address] [place], [place] [address] [place], [place] [address] [place]; lovastatin; Relistor; lisinopril Provider's Orders [facility] - atypical nonhealing wounds in presence of lap band port ICD10: E11.622 Hand Signature: Date(s): Electronic Signature(s) Signed: 11/07/2022 11:21:45 AM By: Fredirick Maudlin MD FACS Entered By: Fredirick Maudlin on 11/07/2022 09:40:01 -------------------------------------------------------------------------------- Problem List Details Patient Name: Date of Service: [name], Jaren 11/07/2022 9:15 A M Medical Record Number: [id number] Patient Account Number: [id number] Date of Birth/Sex: Treating RN: 1956-05-19 (67 y.o. M) Primary Care Provider: [name] Other  Clinician: Referring Provider: Treating Provider/Extender: [name] in Treatment: 11 Active Problems ICD-10 Encounter Code Description Active Date MDM Diagnosis L98.499 Non-pressure chronic ulcer of skin of other sites with unspecified severity 08/18/2022 No Yes E66.2 Morbid (severe) obesity with alveolar hypoventilation 08/18/2022 No Yes I73.9 Peripheral vascular disease, unspecified 08/18/2022 No Yes [id number] Chronic diastolic (congestive) heart failure 08/18/2022 No Yes E11.622 Type 2 diabetes mellitus with other skin ulcer 08/18/2022 No Yes [name], [name] ([id number]) [id number] Page 5 of 10 Inactive Problems Resolved Problems Electronic Signature(s) Signed: 11/07/2022 9:36:57 AM By: Fredirick Maudlin MD FACS Entered By: Fredirick Maudlin on 11/07/2022 09:36:57 -------------------------------------------------------------------------------- Progress Note Details Patient Name: Date of Service: [name], [name] 11/07/2022 9:15 A M Medical Record Number: [id number] Patient Account Number: [id number] Date of Birth/Sex: Treating RN: Jul 03, 1956 (67 y.o. M) Primary Care Provider: [name] Other Clinician: Referring Provider: Treating Provider/Extender: [name] in Treatment: 11 Subjective Chief Complaint Information obtained from Patient 11/09/2018; patient is here for review of wounds on his dorsal right first and second toes and on the dorsal left first toe. 02/22/2022: The patient is here for a new ulcer on his posterior left heel. 08/18/2022: here with new wound on abdomen, similar to prior History of Present Illness (HPI) READMISSION 11/09/2018 This is a now 67 year old man that we had in this clinic over a multitude of years but most recently in 2015. He had wounds on his plantar foot initially in 2007 and 2008 and I think subsequently was seen in 2012 then 2014 into the mid part of 2015 with  wounds on his toes. He is a type II diabetic with peripheral neuropathy but no known PAD. The patient states he was doing well until about a month ago  he noted an area on the left great toe which was superficial and then an area on the right great and second toes about a week later. He is not sure how this happened he simply noticed this when he was lying in bed at night. He has been using peroxide. He has not seen a doctor. He has not been on antibiotics and has had no x-rays. Past medical history; type 2 diabetes with peripheral neuropathy, hypertension, hypothyroidism, low B12 levels, iron deficiency anemia, ventral hernia, diabetic nephropathy, chronic diastolic heart failure and obstructive sleep apnea. ABIs in this clinic were 1.2 on the right and 1.1 on the left 2/28; patient arrived last week for areas on his left first right first and right second toes. X-ray of the right foot did not show any osseous abnormality. Culture of the right great toe showed Staphylococcus aureus methicillin sensitive and he is on dicloxacillin which he started 3 days ago. 3/6; patient with wounds on his right first and second toes and left first toes. The area over the left first toe is extensive we have been using silver alginate. Culture grew MSSA and he is on dicloxacillin which I prescribed on the 25th. He is still taking this which I am not really sure of the reason. He expressed knowledge that this was 4 times a day he should have been out of this around March 2 if he picked it up on the 25th or they gave him the right number of pills. Nevertheless our nurses report more purulent drainage and odor 3/17-Patient returns for the 3 wounds the left first toe and the right first and second toe dorsal surfaces. The left first toe is closed up and healed, the right first toe dorsal surface wound is extensive and appears the same as last time, no purulent drainage or odor reported per nurse, left second toe wound  is slightly smaller. He has completed the dicloxacillin as of today. Noted that his culture previously had grown MSSA. We have been using calcium alginate to the wounds. X-rays have been reviewed. ABIs were reviewed as well. 3/27; patient returns for review of wounds on his right first and right second toes. The wound on the left first toe is healed. 4/10; 2-week follow-up. The area on the right second toe dorsally is healed. Per the patient the left first toe remains healed. He still has an extensive area dorsally over the right first toe inner phalangeal joint area. Rolled up hyper granulated tissue with probably a nonviable surface 4/24; 2-week follow-up. The areas on the right second toe dorsally and right first toe dorsally is healed. The area on the dorsal left first toe over the inner phalangeal joint is smaller but still hyper granulated we have been using Hydrofera Blue 5/15; 3-week follow-up. The areas on the right first toe. Not the left as I said on 4/24. He is using Hydrofera Blue. 5/29; 3-week follow-up. The area on his dorsal right toe over the right inter phalangeal joint is just about closed. We have been using Hydrofera Blue. In passing he showed me his right thumb which is exquisitely tender over the tip of the digit. 6/5; the area on the dorsal right toe over the inner phalangeal joint is closed. Right thumb is better which I gave him antibiotics for last week. Still some tenderness but considerably improved READMISSION 02/22/2022 This is a 67 year old type II diabetic with congestive heart failure, end-stage renal disease on hemodialysis, peripheral artery disease, and morbid obesity. He has  developed an ulcer on his left posterior heel that he believes is secondary to his health assistance not adequately moisturizing his feet. He says it first appeared about 2 weeks ago. He does not have sensation in his feet. He has not noticed any foul odor or drainage. His last hemoglobin A1c  was 7.9 at the beginning of January. ABI in clinic today was 1.15. 03/09/2022: He has a new wound on his abdomen that he says started out as a lump and then the skin broke open when he bumped it on a portion of his hospital bed at home. There is fat exposed. The wound on his heel is a little bit smaller but still has a very pale surface. There is periwound callus accumulation. [name], [name] ([id number]) [id number] Page 6 of 10 03/30/2022: He had another small wound opened up on his abdomen. He says that he thinks these are starting as blisters. He has been on Mounjaro for weight loss and it looks like the loose skin resulting from losing weight might be rubbing and causing small friction injuries. The wound that I treated with chemical cauterization last visit is much smaller; the new wound has a very similar appearance to this. His heel ulcer is smaller with just a little bit of slough buildup on the surface. He continues to have fairly impressive periwound callus formation. 04/07/2022: Both abdominal wounds are smaller today and there is less hypertrophic granulation tissue protruding. The wound on his heel also continues to contract. There is just a layer of slough on the surface. No concern for infection. 04/29/2022: The previous 2 abdominal wounds have closed but he has a new 1 that appears identical to the previous lesions. The wound on his heel is smaller but has a thick layer of callus overlying it. 05/13/2022: His heel wound has healed. The wound on his abdomen remains small but still has some hypertrophic granulation tissue present. He says it drains serous fluid from time to time. READMISSION 08/18/2022: Mr. Chiu returns with reopening of the small wound on his abdomen. He said it closed and then he recently went swimming and later that day, he noted some serosanguineous drainage on his shirt. He has not had any fevers or chills. The drainage has never been  purulent or foul-smelling. As per his previous admission, the wound is small and circular with fat exposed. No concern for infection. 12/14; the patient's original wound that he came in with has healed and is epithelialized however just below this he has another open area. He noticed this 3 days ago with a significant amount of sanguinous drainage. No pain no obvious purulence. 09/07/2022: The wound is shallower this week. No purulent drainage identified. The culture that was taken grew out just a small amount of Staph aureus pansensitive except to ciprofloxacin. No antibiotic was prescribed by our clinic, but apparently the patient's primary care provider prescribed an antibiotic, but he does not recall the name and it is not available in the electronic medical record. 09/29/2022: Apparently the patient's home health providers have not been packing the wound. It now probes to a depth of 2.4 cm. It is clean without any slough or purulent drainage. No concern for infection. 10/24/2022: No real change to the wound. There is still about the same amount of depth. The orifice is narrow and this seems to be complicating appropriate packing of the site. 11/07/2022: The existing wound closed and the previous one reopened. It probes to 2.7 cm. No purulent drainage or malodor.  Patient History Information obtained from Patient. Family History Cancer - Mother,Father, Diabetes - Mother,Father,Siblings, Heart Disease - Siblings, Lung Disease - Mother, No family history of Hereditary Spherocytosis, Hypertension, Kidney Disease, Seizures, Stroke, Thyroid Problems, Tuberculosis. Social History Never smoker, Marital Status - Single, Alcohol Use - Never, Drug Use - No History, Caffeine Use - Daily - Coffee. Medical History Eyes Denies history of Cataracts, Glaucoma, Optic Neuritis Ear/Nose/Mouth/Throat Denies history of Chronic sinus problems/congestion, Middle ear problems Hematologic/Lymphatic Denies history of  Anemia, Hemophilia, Human Immunodeficiency Virus, Lymphedema, Sickle Cell Disease Respiratory Patient has history of Sleep Apnea Denies history of Aspiration, Asthma, Chronic Obstructive Pulmonary Disease (COPD), Pneumothorax, Tuberculosis Cardiovascular Patient has history of Congestive Heart Failure, Hypertension Denies history of Angina, Arrhythmia, Coronary Artery Disease, Deep Vein Thrombosis, Hypotension, Myocardial Infarction, Peripheral Arterial Disease, Peripheral Venous Disease, Phlebitis, Vasculitis Gastrointestinal Denies history of Cirrhosis , Colitis, Crohnoos, Hepatitis A, Hepatitis B, Hepatitis C Endocrine Patient has history of Type II Diabetes Denies history of Type I Diabetes Genitourinary Denies history of End Stage Renal Disease Immunological Denies history of Lupus Erythematosus, Raynaudoos, Scleroderma Integumentary (Skin) Denies history of History of Burn Musculoskeletal Patient has history of Osteoarthritis Denies history of Gout, Rheumatoid Arthritis, Osteomyelitis Neurologic Patient has history of Neuropathy - Diabetic Denies history of Dementia, Quadriplegia, Paraplegia, Seizure Disorder Oncologic Denies history of Received Chemotherapy, Received Radiation Psychiatric Denies history of Anorexia/bulimia, Confinement Anxiety Objective [name], [name] ([id number]) [id number] Page 7 of 10 Constitutional no acute distress. Vitals Time Taken: 9:03 AM, Height: 72 in, Weight: 400 lbs, BMI: 54.2, Temperature: 97.9 F, Pulse: 65 bpm, Respiratory Rate: 18 breaths/min, Blood Pressure: 113/65 mmHg, Capillary Blood Glucose: 118 mg/dl. Respiratory Normal work of breathing on room air. General Notes: 11/07/2022: The existing wound closed and the previous one reopened. It probes to 2.7 cm. No purulent drainage or malodor. Integumentary (Hair, Skin) Wound #24 status is Open. Original cause of wound was Gradually Appeared. The date acquired  was: 08/29/2022. The wound has been in treatment 9 weeks. The wound is located on the Distal Abdomen - midline. The wound measures 0cm length x 0cm width x 0cm depth; 0cm^2 area and 0cm^3 volume. There is no tunneling or undermining noted. There is a none present amount of drainage noted. The wound margin is distinct with the outline attached to the wound base. There is no granulation within the wound bed. There is no necrotic tissue within the wound bed. The periwound skin appearance had no abnormalities noted for texture. The periwound skin appearance had no abnormalities noted for moisture. The periwound skin appearance had no abnormalities noted for color. Periwound temperature was noted as No Abnormality. Wound #25 status is Open. Original cause of wound was Gradually Appeared. The date acquired was: 11/07/2022. The wound is located on the Superior Abdomen - midline. The wound measures 0.4cm length x 0.6cm width x 2.7cm depth; 0.188cm^2 area and 0.509cm^3 volume. There is Fat Layer (Subcutaneous Tissue) exposed. There is no tunneling or undermining noted. There is a medium amount of serosanguineous drainage noted. The wound margin is distinct with the outline attached to the wound base. There is large (67-100%) red granulation within the wound bed. There is a small (1-33%) amount of necrotic tissue within the wound bed including Adherent Slough. The periwound skin appearance had no abnormalities noted for texture. The periwound skin appearance had no abnormalities noted for moisture. The periwound skin appearance had no abnormalities noted for color. Periwound temperature was noted as No Abnormality. Assessment Active Problems ICD-10 Non-pressure chronic  ulcer of skin of other sites with unspecified severity Morbid (severe) obesity with alveolar hypoventilation Peripheral vascular disease, unspecified Chronic diastolic (congestive) heart failure Type 2 diabetes mellitus with other skin  ulcer Plan Follow-up Appointments: Return Appointment in 2 weeks. - Dr. Celine Ahr Rm 2 Bathing/ Shower/ Hygiene: May shower and wash wound with soap and water. - Change dressing every other day Home Health: New wound care orders this week; continue Home Health for wound care. May utilize formulary equivalent dressing for wound treatment orders unless otherwise specified. - THERE IS DEPTH TO THE WOUND 2.7 CM PLEASE PACK THE WOUND USING A SKINNY PROBE TO ADEQUATELY GET THE DRESSING IN THE WOUND Dressing changes to be completed by [place] on Monday / Wednesday / Friday except when patient has scheduled visit at [facility]. Other Home Health Orders/Instructions: - Latricia Heft ordered were: [facility] - atypical nonhealing wounds in presence of lap band port ICD10: E11.622 WOUND #25: - Abdomen - midline Wound Laterality: Superior Cleanser: Soap and Water Discharge Instructions: May shower and wash wound with dial antibacterial soap and water prior to dressing change. Cleanser: Wound Cleanser Discharge Instructions: Cleanse the wound with wound cleanser prior to applying a clean dressing using gauze sponges, not tissue or cotton balls. Prim Dressing: Iodoform packing strip 1/4 (in) ary Discharge Instructions: Lightly pack as instructed Secondary Dressing: Zetuvit Plus Silicone Border Dressing 4x4 (in/in) Discharge Instructions: Apply silicone border over primary dressing as directed. 11/07/2022: The existing wound closed and the previous one reopened. It probes to 2.7 cm. No purulent drainage or malodor. I went back through the patient's chart and looked at several previous CT scans. He does have a Lap-Band port in about the area where his wounds are occurring and multiple scans show a fair amount of inflammation in the subcutaneous tissues. I am curious as to whether or not he is developing sinus tracts as a result of this inflammation and the port. We will continue to pack the  wounds with iodoform packing strips but I am also going to refer him back to [facility] bariatric surgery, who paced the Lap-Band, to see if it warrants removal. Follow-up in 2 weeks. Electronic Signature(s) Signed: 11/07/2022 9:41:14 AM By: Fredirick Maudlin MD FACS [name], [place] ([id number]) [id number] Page 8 of 10 Entered By: Fredirick Maudlin on 11/07/2022 09:41:14 -------------------------------------------------------------------------------- HxROS Details Patient Name: Date of Service: [name], [name] 11/07/2022 9:15 A M Medical Record Number: [id number] Patient Account Number: [id number] Date of Birth/Sex: Treating RN: [birth year] (67 y.o. M) Primary Care Provider: [name] Other Clinician: Referring Provider: Treating Provider/Extender: [name] in Treatment: 11 Information Obtained From Patient Eyes Medical History: Negative for: Cataracts; Glaucoma; Optic Neuritis Ear/Nose/Mouth/Throat Medical History: Negative for: Chronic sinus problems/congestion; Middle ear problems Hematologic/Lymphatic Medical History: Negative for: Anemia; Hemophilia; Human Immunodeficiency Virus; Lymphedema; Sickle Cell Disease Respiratory Medical History: Positive for: Sleep Apnea Negative for: Aspiration; Asthma; Chronic Obstructive Pulmonary Disease (COPD); Pneumothorax; Tuberculosis Cardiovascular Medical History: Positive for: Congestive Heart Failure; Hypertension Negative for: Angina; Arrhythmia; Coronary Artery Disease; Deep Vein Thrombosis; Hypotension; Myocardial Infarction; Peripheral Arterial Disease; Peripheral Venous Disease; Phlebitis; Vasculitis Gastrointestinal Medical History: Negative for: Cirrhosis ; Colitis; Crohns; Hepatitis A; Hepatitis B; Hepatitis C Endocrine Medical History: Positive for: Type II Diabetes Negative for: Type I Diabetes Time with diabetes: 1994 Treated with: Insulin Blood sugar tested every  day: No Genitourinary Medical History: Negative for: End Stage Renal Disease Immunological Medical History: Negative for: Lupus Erythematosus; Raynauds; Scleroderma Integumentary (Skin) Medical History:  Negative for: History of Burn [place], [place] ([id number]) [id number] Page 9 of 10 Musculoskeletal Medical History: Positive for: Osteoarthritis Negative for: Gout; Rheumatoid Arthritis; Osteomyelitis Neurologic Medical History: Positive for: Neuropathy - Diabetic Negative for: Dementia; Quadriplegia; Paraplegia; Seizure Disorder Oncologic Medical History: Negative for: Received Chemotherapy; Received Radiation Psychiatric Medical History: Negative for: Anorexia/bulimia; Confinement Anxiety Immunizations Pneumococcal Vaccine: Received Pneumococcal Vaccination: No Implantable Devices No devices added Family and Social History Cancer: Yes - Mother,Father; Diabetes: Yes - Mother,Father,Siblings; Heart Disease: Yes - Siblings; Hereditary Spherocytosis: No; Hypertension: No; Kidney Disease: No; Lung Disease: Yes - Mother; Seizures: No; Stroke: No; Thyroid Problems: No; Tuberculosis: No; Never smoker; Marital Status - Single; Alcohol Use: Never; Drug Use: No History; Caffeine Use: Daily - Coffee; Financial Concerns: No; Food, Clothing or Shelter Needs: No; Support System Lacking: No; Transportation Concerns: No Electronic Signature(s) Signed: 11/07/2022 11:21:45 AM By: Fredirick Maudlin MD FACS Entered By: Fredirick Maudlin on 11/07/2022 09:39:18 -------------------------------------------------------------------------------- SuperBill Details Patient Name: Date of Service: [name], [name] 11/07/2022 Medical Record Number: [id number] Patient Account Number: [id number] Date of Birth/Sex: Treating RN: [birth year] (67 y.o. [name] Primary Care Provider: [name] Other Clinician: Referring Provider: Treating Provider/Extender: [name] in Treatment: 11 Diagnosis Coding ICD-10 Codes Code Description L98.499 Non-pressure chronic ulcer of skin of other sites with unspecified severity E66.2 Morbid (severe) obesity with alveolar hypoventilation I73.9 Peripheral vascular disease, unspecified [id number] Chronic diastolic (congestive) heart failure E11.622 Type 2 diabetes mellitus with other skin ulcer Facility Procedures : CPT4 Code: [id number] Description: [id number] - WOUND CARE VISIT-LEV 3 EST PT Modifier: Quantity: 1 Physician Procedures [name], [name] ([id number]): CPT4 Code Description [id number] - WC PHYS LEVEL 4 - EST PT ICD-10 Diagnosis Description L98.499 Non-pressure chronic ulcer of skin of other sites with unspec E66.2 Morbid (severe) obesity with alveolar hypoventilation  [id number] Chronic diastolic (congestive) heart failure E11.622 Type 2 diabetes mellitus with other skin ulcer [id number] Page 10 of 10: Quantity Modifier 25 1 ified severity Electronic Signature(s) Signed: 11/07/2022 9:41:38 AM By: Fredirick Maudlin MD FACS Entered By: Fredirick Maudlin on 11/07/2022 09:41:38

## 2022-11-08 NOTE — Progress Notes (Signed)
[name], Martavis ([id number]) [id number] Page 1 of 9 Visit Report for 11/07/2022 Arrival Information Details Patient Name: Date of Service: JOB, SAVARD 11/07/2022 9:15 A M Medical Record Number: [id number] Patient Account Number: [id number] Date of Birth/Sex: Treating RN: May 08, 1956 (67 y.o. [name] Primary Care Sheron Tallman: [name] Other Clinician: Referring Lamesha Tibbits: Treating Malaak Stach/Extender: [name] in Treatment: 11 Visit Information History Since Last Visit Added or deleted any medications: No Patient Arrived: Wheel Chair Any new allergies or adverse reactions: No Arrival Time: 08:59 Had a fall or experienced change in No Accompanied By: self activities of daily living that may affect Transfer Assistance: None risk of falls: Patient Identification Verified: Yes Signs or symptoms of abuse/neglect since last visito No Secondary Verification Process Completed: Yes Hospitalized since last visit: No Implantable device outside of the clinic excluding No cellular tissue based products placed in the center since last visit: Has Dressing in Place as Prescribed: Yes Pain Present Now: Yes Electronic Signature(s) Signed: 11/07/2022 4:41:15 PM By: Adline Peals Entered By: Adline Peals on 11/07/2022 09:02:58 -------------------------------------------------------------------------------- Clinic Level of Care Assessment Details Patient Name: Date of Service: [name], [name] 11/07/2022 9:15 A M Medical Record Number: [id number] Patient Account Number: [id number] Date of Birth/Sex: Treating RN: 1956/08/27 (67 y.o. [name] Primary Care Loleta Frommelt: [name] Other Clinician: Referring Yitzchok Carriger: Treating Tiandra Swoveland/Extender: [name] in Treatment: 11 Clinic Level of Care Assessment Items TOOL 4 Quantity Score X- 1 0 Use when only an EandM is performed on  FOLLOW-UP visit ASSESSMENTS - Nursing Assessment / Reassessment X- 1 10 Reassessment of Co-morbidities (includes updates in patient status) X- 1 5 Reassessment of Adherence to Treatment Plan ASSESSMENTS - Wound and Skin A ssessment / Reassessment X - Simple Wound Assessment / Reassessment - one wound 1 5 []$  - 0 Complex Wound Assessment / Reassessment - multiple wounds []$  - 0 Dermatologic / Skin Assessment (not related to wound area) ASSESSMENTS - Focused Assessment []$  - 0 Circumferential Edema Measurements - multi extremities []$  - 0 Nutritional Assessment / Counseling / Intervention [place], [name] ([id number]) [id number] Page 2 of 9 []$  - 0 Lower Extremity Assessment (monofilament, tuning fork, pulses) []$  - 0 Peripheral Arterial Disease Assessment (using hand held doppler) ASSESSMENTS - Ostomy and/or Continence Assessment and Care []$  - 0 Incontinence Assessment and Management []$  - 0 Ostomy Care Assessment and Management (repouching, etc.) PROCESS - Coordination of Care X - Simple Patient / Family Education for ongoing care 1 15 []$  - 0 Complex (extensive) Patient / Family Education for ongoing care X- 1 10 Staff obtains Programmer, systems, Records, T Results / Process Orders est []$  - 0 Staff telephones HHA, Nursing Homes / Clarify orders / etc []$  - 0 Routine Transfer to another Facility (non-emergent condition) []$  - 0 Routine Hospital Admission (non-emergent condition) []$  - 0 New Admissions / Biomedical engineer / Ordering NPWT Apligraf, etc. , []$  - 0 Emergency Hospital Admission (emergent condition) X- 1 10 Simple Discharge Coordination []$  - 0 Complex (extensive) Discharge Coordination PROCESS - Special Needs []$  - 0 Pediatric / Minor Patient Management []$  - 0 Isolation Patient Management []$  - 0 Hearing / Language / Visual special needs []$  - 0 Assessment of Community assistance (transportation, D/C planning, etc.) []$  - 0 Additional  assistance / Altered mentation []$  - 0 Support Surface(s) Assessment (bed, cushion, seat, etc.) INTERVENTIONS - Wound Cleansing / Measurement X - Simple Wound Cleansing - one wound 1 5 []$  - 0 Complex Wound Cleansing -  multiple wounds X- 1 5 Wound Imaging (photographs - any number of wounds) []$  - 0 Wound Tracing (instead of photographs) X- 1 5 Simple Wound Measurement - one wound []$  - 0 Complex Wound Measurement - multiple wounds INTERVENTIONS - Wound Dressings X - Small Wound Dressing one or multiple wounds 1 10 []$  - 0 Medium Wound Dressing one or multiple wounds []$  - 0 Large Wound Dressing one or multiple wounds []$  - 0 Application of Medications - topical []$  - 0 Application of Medications - injection INTERVENTIONS - Miscellaneous []$  - 0 External ear exam []$  - 0 Specimen Collection (cultures, biopsies, blood, body fluids, etc.) []$  - 0 Specimen(s) / Culture(s) sent or taken to Lab for analysis []$  - 0 Patient Transfer (multiple staff / Civil Service fast streamer / Similar devices) []$  - 0 Simple Staple / Suture removal (25 or less) []$  - 0 Complex Staple / Suture removal (26 or more) []$  - 0 Hypo / Hyperglycemic Management (close monitor of Blood Glucose) [name], Crew ([id number]) [id number] Page 3 of 9 []$  - 0 Ankle / Brachial Index (ABI) - do not check if billed separately X- 1 5 Vital Signs Has the patient been seen at the hospital within the last three years: Yes Total Score: 85 Level Of Care: New/Established - Level 3 Electronic Signature(s) Signed: 11/07/2022 4:41:15 PM By: Adline Peals Entered By: Adline Peals on 11/07/2022 09:38:35 -------------------------------------------------------------------------------- Encounter Discharge Information Details Patient Name: Date of Service: [name], [name] 11/07/2022 9:15 A M Medical Record Number: [id number] Patient Account Number: [id number] Date of Birth/Sex: Treating RN: 1955/11/17 (67 y.o. [name] Primary Care Ella Guillotte: [name] Other Clinician: Referring Shirel Mallis: Treating Chele Cornell/Extender: [name] in Treatment: 11 Encounter Discharge Information Items Discharge Condition: Stable Ambulatory Status: Wheelchair Discharge Destination: Home Transportation: Private Auto Accompanied By: self Schedule Follow-up Appointment: Yes Clinical Summary of Care: Patient Declined Electronic Signature(s) Signed: 11/07/2022 4:41:15 PM By: Sabas Sous By: Adline Peals on 11/07/2022 09:38:09 -------------------------------------------------------------------------------- Lower Extremity Assessment Details Patient Name: Date of Service: [name], [name] 11/07/2022 9:15 A M Medical Record Number: [id number] Patient Account Number: [id number] Date of Birth/Sex: Treating RN: 01-16-1956 (67 y.o. [name] Primary Care Cherye Gaertner: [name] Other Clinician: Referring Kandas Oliveto: Treating Melonee Gerstel/Extender: [name] in Treatment: 11 Electronic Signature(s) Signed: 11/07/2022 4:41:15 PM By: Sabas Sous By: Adline Peals on 11/07/2022 09:06:51 -------------------------------------------------------------------------------- Multi Wound Chart Details Patient Name: Date of Service: [name], Khing 11/07/2022 [place] Medical Record Number: [id number] Patient Account Number: [id number] HOBY, FOLLEN ([id number]) [phone].pdf Page 4 of 9 Date of Birth/Sex: Treating RN: 09-05-1956 (67 y.o. M) Primary Care Jadamarie Butson: Other Clinician: [name] Referring Tacarra Justo: Treating Daija Routson/Extender: [name] in Treatment: 11 Vital Signs Height(in): 72 Capillary Blood Glucose(mg/dl): 118 Weight(lbs): 400 Pulse(bpm): 72 Body Mass Index(BMI): 54.2 Blood Pressure(mmHg): 113/65 Temperature(F): 97.9 Respiratory  Rate(breaths/min): 18 [24:Photos:] [N/A:N/A] Distal Abdomen - midline Superior Abdomen - midline N/A Wound Location: Gradually Appeared Gradually Appeared N/A Wounding Event: Lesion Atypical N/A Primary Etiology: Sleep Apnea, Congestive Heart Sleep Apnea, Congestive Heart N/A Comorbid History: Failure, Hypertension, Type II Failure, Hypertension, Type II Diabetes, Osteoarthritis, Neuropathy Diabetes, Osteoarthritis, Neuropathy 08/29/2022 11/07/2022 N/A Date Acquired: 9 0 N/A Weeks of Treatment: Open Open N/A Wound Status: No No N/A Wound Recurrence: 0x0x0 0.4x0.6x2.7 N/A Measurements L x W x D (cm) 0 0.188 N/A A (cm) : rea 0 0.509 N/A Volume (cm) : 100.00% N/A N/A % Reduction in Area: 100.00% N/A N/A %  Reduction in Volume: Full Thickness Without Exposed Full Thickness Without Exposed N/A Classification: Support Structures Support Structures None Present Medium N/A Exudate Amount: N/A Serosanguineous N/A Exudate Type: N/A red, brown N/A Exudate Color: Distinct, outline attached Distinct, outline attached N/A Wound Margin: None Present (0%) Large (67-100%) N/A Granulation Amount: N/A Red N/A Granulation Quality: None Present (0%) Small (1-33%) N/A Necrotic Amount: Fascia: No Fat Layer (Subcutaneous Tissue): Yes N/A Exposed Structures: Fat Layer (Subcutaneous Tissue): No Fascia: No Tendon: No Tendon: No Muscle: No Muscle: No Joint: No Joint: No Bone: No Bone: No Large (67-100%) Small (1-33%) N/A Epithelialization: No Abnormalities Noted No Abnormalities Noted N/A Periwound Skin Texture: No Abnormalities Noted No Abnormalities Noted N/A Periwound Skin Moisture: No Abnormalities Noted No Abnormalities Noted N/A Periwound Skin Color: No Abnormality No Abnormality N/A Temperature: Treatment Notes Electronic Signature(s) Signed: 11/07/2022 9:37:12 AM By: Fredirick Maudlin MD FACS Entered By: Fredirick Maudlin on 11/07/2022  09:37:12 -------------------------------------------------------------------------------- Multi-Disciplinary Care Plan Details Patient Name: Date of Service: [name], [name] 11/07/2022 [place] Medical Record Number: [id number] Patient Account Number: [id number] [name], [name] ([id number]) [id number] Page 5 of 9 Date of Birth/Sex: Treating RN: [birth year] (67 y.o. [name] Primary Care Maylynn Orzechowski: Other Clinician: [name] Referring Loney Peto: Treating Kemberly Taves/Extender: [name] in Treatment: 11 Active Inactive Abuse / Safety / Falls / Self Care Management Nursing Diagnoses: Impaired physical mobility Potential for falls Goals: Patient/caregiver will verbalize/demonstrate measure taken to improve self care Date Initiated: 10/24/2022 Target Resolution Date: 12/23/2022 Goal Status: Active Patient/caregiver will verbalize/demonstrate measures taken to improve the patient's personal safety Date Initiated: 10/24/2022 Target Resolution Date: 12/23/2022 Goal Status: Active Interventions: Provide education on fall prevention Notes: Electronic Signature(s) Signed: 11/07/2022 4:41:15 PM By: Adline Peals Entered By: Adline Peals on 11/07/2022 09:37:38 -------------------------------------------------------------------------------- Pain Assessment Details Patient Name: Date of Service: JOREL, NARDONE 11/07/2022 9:15 A M Medical Record Number: [id number] Patient Account Number: [id number] Date of Birth/Sex: Treating RN: July 11, 1956 (67 y.o. [name] Primary Care Darcia Lampi: [name] Other Clinician: Referring Nikolette Reindl: Treating Jlynn Ly/Extender: [name] in Treatment: 11 Active Problems Location of Pain Severity and Description of Pain Patient Has Paino Yes Site Locations Pain Location: Generalized Pain Duration of the Pain. Constant / Intermittento  Constant Rate the pain. Current Pain Level: 3 Pain Management and Medication Current Pain Management: JESSTIN, FECHNER ([id number]) [id number] Page 6 of 9 Electronic Signature(s) Signed: 11/07/2022 4:41:15 PM By: Sabas Sous By: Adline Peals on 11/07/2022 09:03:43 -------------------------------------------------------------------------------- Patient/Caregiver Education Details Patient Name: Date of Service: [name], O[place] 2/19/2024andnbsp9:15 A M Medical Record Number: [id number] Patient Account Number: [id number] Date of Birth/Gender: Treating RN: 04-Jul-1956 (67 y.o. [name] Primary Care Physician: [name] Other Clinician: Referring Physician: Treating Physician/Extender: [name] in Treatment: 11 Education Assessment Education Provided To: Patient Education Topics Provided Wound/Skin Impairment: Methods: Explain/Verbal Responses: Reinforcements needed, State content correctly Electronic Signature(s) Signed: 11/07/2022 4:41:15 PM By: Adline Peals Entered By: Adline Peals on 11/07/2022 09:37:48 -------------------------------------------------------------------------------- Wound Assessment Details Patient Name: Date of Service: CULLAN, [name] 11/07/2022 9:15 A M Medical Record Number: [id number] Patient Account Number: [id number] Date of Birth/Sex: Treating RN: 07/05/1956 (67 y.o. [name] Primary Care Iverson Sees: [name] Other Clinician: Referring Zayana Salvador: Treating Jovonta Levit/Extender: [name] in Treatment: 11 Wound Status Wound Number: 24 Primary Lesion Etiology: Wound Location: Distal Abdomen - midline Wound Open Wounding Event: Gradually Appeared Status: Date Acquired: 08/29/2022 Comorbid Sleep Apnea, Congestive Heart Failure, Hypertension,  Type II Weeks Of Treatment: 9 History: Diabetes, Osteoarthritis,  Neuropathy Clustered Wound: No Photos [place], Jermy ([id number]) [id number] Page 7 of 9 Wound Measurements Length: (cm) Width: (cm) Depth: (cm) Area: (cm) Volume: (cm) 0 % Reduction in Area: 100% 0 % Reduction in Volume: 100% 0 Epithelialization: Large (67-100%) 0 Tunneling: No 0 Undermining: No Wound Description Classification: Full Thickness Without Exposed Support Structures Wound Margin: Distinct, outline attached Exudate Amount: None Present Foul Odor After Cleansing: No Slough/Fibrino No Wound Bed Granulation Amount: None Present (0%) Exposed Structure Necrotic Amount: None Present (0%) Fascia Exposed: No Fat Layer (Subcutaneous Tissue) Exposed: No Tendon Exposed: No Muscle Exposed: No Joint Exposed: No Bone Exposed: No Periwound Skin Texture Texture Color No Abnormalities Noted: Yes No Abnormalities Noted: Yes Moisture Temperature / Pain No Abnormalities Noted: Yes Temperature: No Abnormality Electronic Signature(s) Signed: 11/07/2022 4:41:15 PM By: Adline Peals Entered By: Adline Peals on 11/07/2022 09:13:09 -------------------------------------------------------------------------------- Wound Assessment Details Patient Name: Date of Service: [name], [name] 11/07/2022 9:15 A M Medical Record Number: [id number] Patient Account Number: [id number] Date of Birth/Sex: Treating RN: 26-Aug-1956 (67 y.o. [name] Primary Care Annajulia Lewing: [name] Other Clinician: Referring Arsen Mangione: Treating Dawna Jakes/Extender: [name] in Treatment: 11 Wound Status Wound Number: 25 Primary Atypical Etiology: Wound Location: Superior Abdomen - midline Wound Open Wounding Event: Gradually Appeared Status: Date Acquired: 11/07/2022 Comorbid Sleep Apnea, Congestive Heart Failure, Hypertension, Type II Weeks Of Treatment: 0 History: Diabetes, Osteoarthritis, Neuropathy Clustered Wound:  No Photos [name], [name] ([id number]) [id number] Page 8 of 9 Wound Measurements Length: (cm) 0.4 Width: (cm) 0.6 Depth: (cm) 2.7 Area: (cm) 0.188 Volume: (cm) 0.509 % Reduction in Area: % Reduction in Volume: Epithelialization: Small (1-33%) Tunneling: No Undermining: No Wound Description Classification: Full Thickness Without Exposed Support Structures Wound Margin: Distinct, outline attached Exudate Amount: Medium Exudate Type: Serosanguineous Exudate Color: red, brown Foul Odor After Cleansing: No Slough/Fibrino Yes Wound Bed Granulation Amount: Large (67-100%) Exposed Structure Granulation Quality: Red Fascia Exposed: No Necrotic Amount: Small (1-33%) Fat Layer (Subcutaneous Tissue) Exposed: Yes Necrotic Quality: Adherent Slough Tendon Exposed: No Muscle Exposed: No Joint Exposed: No Bone Exposed: No Periwound Skin Texture Texture Color No Abnormalities Noted: Yes No Abnormalities Noted: Yes Moisture Temperature / Pain No Abnormalities Noted: Yes Temperature: No Abnormality Treatment Notes Wound #25 (Abdomen - midline) Wound Laterality: Superior Cleanser Soap and Water Discharge Instruction: May shower and wash wound with dial antibacterial soap and water prior to dressing change. Wound Cleanser Discharge Instruction: Cleanse the wound with wound cleanser prior to applying a clean dressing using gauze sponges, not tissue or cotton balls. Peri-Wound Care Topical Primary Dressing Iodoform packing strip 1/4 (in) Discharge Instruction: Lightly pack as instructed Secondary Dressing Zetuvit Plus Silicone Border Dressing 4x4 (in/in) Discharge Instruction: Apply silicone border over primary dressing as directed. Secured With Compression Wrap Compression Stockings Environmental education officer) Signed: 11/07/2022 4:41:15 PM By: Alphia Moh, Orris ([id number]) [phone].pdf Page 9 of 9 Entered  By: Adline Peals on 11/07/2022 09:13:36 -------------------------------------------------------------------------------- Vitals Details Patient Name: Date of Service: [name], [name] 11/07/2022 9:15 A M Medical Record Number: [id number] Patient Account Number: [id number] Date of Birth/Sex: Treating RN: 1956/04/16 (67 y.o. [name] Primary Care Samra Pesch: [name] Other Clinician: Referring Roxane Puerto: Treating Heaven Meeker/Extender: [name] in Treatment: 11 Vital Signs Time Taken: 09:03 Temperature (F): 97.9 Height (in): 72 Pulse (bpm): 65 Weight (lbs): 400 Respiratory Rate (breaths/min): 18 Body Mass Index (BMI): 54.2 Blood Pressure (mmHg): 113/65 Capillary Blood Glucose (  mg/dl): 118 Reference Range: 80 - 120 mg / dl Electronic Signature(s) Signed: 11/07/2022 4:41:15 PM By: Adline Peals Entered By: Adline Peals on 11/07/2022 09:03:26

## 2022-11-09 ENCOUNTER — Encounter: Payer: Self-pay | Admitting: Podiatry

## 2022-11-09 ENCOUNTER — Ambulatory Visit (INDEPENDENT_AMBULATORY_CARE_PROVIDER_SITE_OTHER): Payer: Medicare PPO | Admitting: Podiatry

## 2022-11-09 VITALS — BP 106/54

## 2022-11-09 DIAGNOSIS — Z8631 Personal history of diabetic foot ulcer: Secondary | ICD-10-CM | POA: Diagnosis not present

## 2022-11-09 DIAGNOSIS — E1142 Type 2 diabetes mellitus with diabetic polyneuropathy: Secondary | ICD-10-CM | POA: Diagnosis not present

## 2022-11-09 DIAGNOSIS — M2141 Flat foot [pes planus] (acquired), right foot: Secondary | ICD-10-CM | POA: Diagnosis not present

## 2022-11-09 DIAGNOSIS — B351 Tinea unguium: Secondary | ICD-10-CM | POA: Diagnosis not present

## 2022-11-09 DIAGNOSIS — L84 Corns and callosities: Secondary | ICD-10-CM

## 2022-11-09 DIAGNOSIS — M79674 Pain in right toe(s): Secondary | ICD-10-CM | POA: Diagnosis not present

## 2022-11-09 DIAGNOSIS — M79675 Pain in left toe(s): Secondary | ICD-10-CM | POA: Diagnosis not present

## 2022-11-09 DIAGNOSIS — M2142 Flat foot [pes planus] (acquired), left foot: Secondary | ICD-10-CM

## 2022-11-09 DIAGNOSIS — E119 Type 2 diabetes mellitus without complications: Secondary | ICD-10-CM

## 2022-11-09 NOTE — Progress Notes (Signed)
ANNUAL DIABETIC FOOT EXAM  Subjective: [name] presents today for annual diabetic foot examination.  Chief Complaint  Patient presents with   Nail Problem    DFC [id number] A1C-do not know PCP-[place], [place] PCP VST-"2 Weeks ago"    Patient confirms h/o diabetes.  Patient relates 67 year h/o diabetes.  Patient denies any h/o foot wounds.  Patient has h/o foot ulcer of left heel, which healed via help of Dr. Celine Ahr in [facility].  Patient has been diagnosed with neuropathy.  Risk factors: diabetes, diabetic neuropathy, PAD, HTN, CHF, CKD, hyperlipidemia.  [name], NP is patient's PCP.   Past Medical History:  Diagnosis Date   Anemia    iron   CHF (congestive heart failure) ([place])    Presumed diastolic. Echo (06/07) w.EF 45%, severe posterior HK, mild LV hypertrophy, No further work-up of abnormal echo was done. pt denies CHF   Colon cancer ([place]) 2007   s/p sigmoid colectomy   Diabetes mellitus    Has Hx of diabetic foot ulcer & peripheral neuropathy  type2   Dyspnea    w/ activity   History of PFTs 05/2009   Mild Obstructive defect   HTN (hypertension)    Hyperlipidemia    Morbid obesity (HCC)    OSA on CPAP    Pneumonia    week ago    Patient Active Problem List   Diagnosis Date Noted   Gout attack 11/27/2021   Hypocalcemia [id number]   Acute metabolic encephalopathy [id number]   Acute respiratory failure with hypoxia and hypercapnia ([place]) 11/25/2021   Stage 3b chronic kidney disease (CKD) ([place]) 11/25/2021   Syncope 11/25/2021   Hypoglycemia 11/24/2021   Fall (on)(from) sidewalk curb, initial encounter 11/24/2021   Chronic respiratory failure with hypoxia (HCC) 11/24/2021   Hyperkalemia 09/28/2021   Dependence on supplemental oxygen 09/19/2021   Long term (current) use of insulin ([place]) 09/19/2021   Opioid abuse, uncomplicated ([place]) [id number]   Type 2 diabetes mellitus with diabetic peripheral angiopathy without gangrene ([place])  09/19/2021   Insulin-requiring or dependent type II diabetes mellitus ([place]) 07/27/2021   Encounter for general adult medical examination with abnormal findings 04/06/2021   Calculus of gallbladder without cholecystitis without obstruction 04/06/2021   Chronic idiopathic constipation 11/09/2020   PAD (peripheral artery disease) ([place]) 07/24/2020   Ulcer of left foot, limited to breakdown of skin ([place]) 07/23/2020   Drug-induced erectile dysfunction 05/22/2019   Onychomycosis of toenail 05/21/2019   Class 3 obesity with alveolar hypoventilation, serious comorbidity, and body mass index (BMI) of 50.0 to 59.9 in adult ([place]) 02/07/2019   Ventral hernia without obstruction or gangrene 05/31/2018   Therapeutic opioid-induced constipation (OIC) 05/31/2018   Long-term current use of opiate analgesic 02/15/2018   Left lumbar radiculopathy 01/29/2018   B12 deficiency 10/17/2017   Bleeding internal hemorrhoids    Insomnia 06/30/2017   Left eye affected by proliferative diabetic retinopathy with traction retinal detachment not involving macula, associated with type 2 diabetes mellitus ([place]) 04/20/2017   Right eye affected by proliferative diabetic retinopathy with traction retinal detachment involving macula, associated with type 2 diabetes mellitus ([place]) 04/20/2017   Vitamin D deficiency 12/23/2016   Thiamine deficiency 08/02/2016   Primary osteoarthritis involving multiple joints 08/03/2015   Diabetes mellitus type II, uncontrolled 05/23/2014   Herpesviral keratitis 05/23/2014   Diabetes mellitus due to underlying condition with both eyes affected by proliferative retinopathy without macular edema, with long-term current use of insulin ([place]) 05/23/2014   Diabetic foot ulcer  02/05/2014   Iron deficiency anemia 01/09/2014   Osteoarthritis of hip 05/08/2013   Diabetic nephropathy ([place]) 05/07/2013   Bariatric surgery status 01/29/2013   Benign prostatic hyperplasia without lower urinary tract symptoms  01/11/2013   Hypothyroidism 01/11/2013   Colon cancer ([place]) 10/04/2012   Chronic diastolic CHF (congestive heart failure) ([place]) 04/13/2012   Diabetic peripheral neuropathy ([place]) 05/29/2008   OSA (obstructive sleep apnea) 10/01/2007   CARDIOMYOPATHY 10/01/2007   Hyperlipidemia with target LDL less than 100 07/24/2007   Essential hypertension 07/24/2007   Past Surgical History:  Procedure Laterality Date   BARIATRIC SURGERY  12/2009   Lap Band/At [facility]   COLONOSCOPY  multiple   COLONOSCOPY WITH PROPOFOL N/A 11/26/2012   Procedure: COLONOSCOPY WITH PROPOFOL;  Surgeon: Gatha Mayer, MD;  Location: [facility];  Service: Endoscopy;  Laterality: N/A;  may need pre appt. with anesthesia due to morbid obesity   COLONOSCOPY WITH PROPOFOL N/A 07/11/2017   Procedure: COLONOSCOPY WITH PROPOFOL;  Surgeon: Gatha Mayer, MD;  Location: [facility];  Service: Endoscopy;  Laterality: N/A;   [place]   '80/Right  '84/Left   EYE SURGERY Right 05/2017   HERNIA REPAIR     HIP SURGERY     bilateral   INCISIONAL HERNIA REPAIR N/A 12/18/2013   Procedure:  REPAIR OF INCARCERATED INCISIONAL HERNIA;  Surgeon: Rolm Bookbinder, MD;  Location: [place];  Service: General;  Laterality: N/A;   INSERTION OF MESH N/A 12/18/2013   Procedure: INSERTION OF MESH;  Surgeon: Rolm Bookbinder, MD;  Location: [place];  Service: General;  Laterality: N/A;   Sigmoid Colectomy  10/2005   [place]   TONSILLECTOMY     Current Outpatient Medications on File Prior to Visit  Medication Sig Dispense Refill   Accu-Chek Softclix Lancets lancets CHECK BLOOD SUGAR 2 TIMES  DAILY 300 each 3   Albuterol Sulfate (PROAIR RESPICLICK) [id number] (90 Base) MCG/ACT AEPB Inhale 1 puff into the lungs 4 (four) times daily as needed. 3 each 1   allopurinol (ZYLOPRIM) 100 MG tablet Take 1 tablet (100 mg total) by mouth daily. 30 tablet 2   aspirin EC 81 MG tablet Take 1 tablet (81 mg total) by mouth daily. 90 tablet 1    atorvastatin (LIPITOR) 40 MG tablet Take 1 tablet (40 mg total) by mouth daily. 90 tablet 1   bisoprolol (ZEBETA) 10 MG tablet Take 1 tablet (10 mg total) by mouth every evening. 90 tablet 1   blood glucose meter kit and supplies KIT Inject 1 each into the skin 4 (four) times daily. Use to check blood sugar three times daily. Dispense Freestyle Libre according to insurance preference. DX: E11.8 (Patient not taking: Reported on 06/28/2022) 2 each 1   blood glucose meter kit and supplies Dispense based on patient and insurance preference. Use up to four times daily as directed. (FOR ICD-10 E10.9, E11.9). 1 each 0   Blood Glucose Monitoring Suppl (ACCU-CHEK AVIVA CONNECT) w/Device KIT 1 Act by Does not apply route 3 (three) times daily as needed. 1 kit 3   clobetasol ointment (TEMOVATE) 0.05 % Apply topically 2 (two) times daily.     colchicine 0.6 MG tablet Take 0.6 mg by mouth daily.     Continuous Blood Gluc Sensor (FREESTYLE LIBRE 2 SENSOR) MISC 1 Act by Does not apply route daily. (Patient not taking: Reported on 06/28/2022) 2 each 5   empagliflozin (JARDIANCE) 10 MG TABS tablet Take 1 tablet (10 mg total) by  mouth daily before breakfast. 30 tablet 11   ferrous sulfate 325 (65 FE) MG tablet Take 1 tablet (325 mg total) by mouth 2 (two) times daily with a meal. 180 tablet 1   furosemide (LASIX) 20 MG tablet Take 5 tablets (100 mg total) by mouth 2 (two) times daily. 120 tablet 1   gentamicin ointment (GARAMYCIN) 0.1 % Apply topically as directed.     glucose blood (ACCU-CHEK AVIVA PLUS) test strip Use to test blood sugar twice daily. DX E11.8 200 strip 3   HYDROcodone-acetaminophen (NORCO) 10-325 MG tablet Take 1 tablet by mouth every 6 (six) hours as needed.     hydrocortisone 2.5 % cream Apply topically 2 (two) times daily.     insulin detemir (LEVEMIR) 100 UNIT/ML injection Inject 60 Units into the skin daily.     Insulin Pen Needle (ULTICARE SHORT PEN NEEDLES) 31G X 8 MM MISC USE TWO PEN NEEDLES  DAILY WITH INSULIN PENS 200 each 2   levothyroxine (SYNTHROID) 75 MCG tablet Take 75 mcg by mouth daily.     losartan (COZAAR) 25 MG tablet TAKE 2 TABLETS BY MOUTH DAILY. 60 tablet 3   metolazone (ZAROXOLYN) 2.5 MG tablet Take 1 tablet (2.5 mg total) by mouth once a week. Every Tuesday 10 tablet 3   Multiple Vitamins-Minerals (MULTIVITAMIN ADULT PO) Take 1 tablet by mouth daily.     nystatin (MYCOSTATIN/NYSTOP) powder Apply 1 application topically 3 (three) times daily. 60 g 5   potassium chloride SA (KLOR-CON M20) 20 MEQ tablet Take 1 tablet (20 mEq total) by mouth once a week. Every Tuesday 10 tablet 3   RESTASIS 0.05 % ophthalmic emulsion PLACE 1 DROP INTO BOTH EYES TWICE A DAY 60 each 5   No current facility-administered medications on file prior to visit.    Allergies  Allergen Reactions   Shellfish Allergy Anaphylaxis   Lisinopril Cough   Relistor [Methylnaltrexone Bromide] Other (See Comments)    confusion   Lovastatin Itching   Social History   Occupational History   Occupation: Human resources officer  Tobacco Use   Smoking status: Never   Smokeless tobacco: Never  Vaping Use   Vaping Use: Never used  Substance and Sexual Activity   Alcohol use: Not Currently    Alcohol/week: 0.0 standard drinks of alcohol    Comment: states he does not drink    Drug use: No   Sexual activity: Not Currently   Family History  Problem Relation Age of Onset   Hepatitis Mother        hepatitis C   Diabetes Mother    Congestive Heart Failure Mother    CAD Mother    Heart attack Brother 20   Diabetes Father    Heart failure Brother    Stomach cancer Neg Hx    Colon cancer Neg Hx    Immunization History  Administered Date(s) Administered   Influenza-Unspecified 05/31/2020, 08/04/2021   PFIZER(Purple Top)SARS-COV-2 Vaccination 12/14/2019, 12/30/2019   Pneumococcal Conjugate-13 11/22/2018   Pneumococcal Polysaccharide-23 03/24/2014, 04/27/2020   Tdap 06/23/2011, 12/08/2020     Review of  Systems: Negative except as noted in the HPI.   Objective: Vitals:   11/09/22 0811  BP: (!) 106/54    Bige Barrientos is a pleasant 67 y.o. male in NAD. AAO X 3.  Vascular Examination: Capillary refill time immediate b/l. Vascular status intact b/l with palpable pedal pulses. Pedal hair absent b/l. No pain with calf compression b/l. Skin temperature gradient WNL b/l. Nonpitting edema noted  BLE.  Neurological Examination: Pt has subjective symptoms of neuropathy. Protective sensation diminished with 10g monofilament b/l. Vibratory sensation diminished b/l.  Dermatological Examination: Pedal skin with normal turgor, texture and tone b/l.  No open wounds. No interdigital macerations.   Toenails 1-5 b/l thick, discolored, elongated with subungual debris and pain on dorsal palpation.   Hyperkeratotic lesion(s) left great toe, L 2nd toe, R 3rd toe, and 1st metatarsal head left foot.  No erythema, no edema, no drainage, no fluctuance.  Musculoskeletal Examination: Muscle strength 5/5 to all lower extremity muscle groups bilaterally. Pes planus deformity noted bilateral LE. Utilizes motorized chair for mobility assistance.  Radiographs: None  Footwear Assessment: Does the patient wear appropriate shoes? Yes. Does the patient need inserts/orthotics? No.  Lab Results  Component Value Date   HGBA1C 7.9 (H) 09/28/2021   ADA Risk Categorization: High Risk  Patient has one or more of the following: Loss of protective sensation Absent pedal pulses Severe Foot deformity History of foot ulcer  Assessment: 1. Pain due to onychomycosis of toenails of both feet   2. Corns and callosities   3. Pes planus of both feet   4. Personal history of diabetic foot ulcer   5. Diabetic peripheral neuropathy ([place])   6. Encounter for diabetic foot exam ([place])     Plan: Orders Placed This Encounter  Procedures   For Home Use Only DME Diabetic Shoe    Dispense one pair extra depth shoes and 3 pair  total contact insoles.   FOR HOME USE ONLY DME DIABETIC SHOE  -Patient was evaluated and treated. All patient's and/or POA's questions/concerns answered on today's visit. -Diabetic foot examination performed today. -Discussed and educated patient on diabetic foot care, especially with  regards to the vascular, neurological and musculoskeletal systems. -Toenails 1-5 b/l were debrided in length and girth with sterile nail nippers and dremel without iatrogenic bleeding.  -Corn(s) L 2nd toe and R 3rd toe and callus(es) L hallux and 1st metatarsal head left foot were pared utilizing sterile scalpel blade without incident. Total number debrided =4. -Patient/POA to call should there be question/concern in the interim. Return in about 3 months (around 02/07/2023).  Marzetta Board, DPM

## 2022-11-14 ENCOUNTER — Other Ambulatory Visit (HOSPITAL_COMMUNITY): Payer: Self-pay | Admitting: Cardiology

## 2022-11-15 NOTE — Telephone Encounter (Signed)
Patient is following up on getting his CPAP machine.

## 2022-11-16 ENCOUNTER — Ambulatory Visit: Payer: Medicare PPO

## 2022-11-16 NOTE — Telephone Encounter (Signed)
Called the sleep lab and spoke with Terri Pennix who left a message yesterday for the the patient to call back to schedule sleep study. She states that he will get called again, however just not today.

## 2022-11-21 ENCOUNTER — Encounter (HOSPITAL_BASED_OUTPATIENT_CLINIC_OR_DEPARTMENT_OTHER): Payer: Medicare PPO | Attending: General Surgery | Admitting: General Surgery

## 2022-11-21 DIAGNOSIS — E1122 Type 2 diabetes mellitus with diabetic chronic kidney disease: Secondary | ICD-10-CM | POA: Insufficient documentation

## 2022-11-21 DIAGNOSIS — E1151 Type 2 diabetes mellitus with diabetic peripheral angiopathy without gangrene: Secondary | ICD-10-CM | POA: Insufficient documentation

## 2022-11-21 DIAGNOSIS — M1990 Unspecified osteoarthritis, unspecified site: Secondary | ICD-10-CM | POA: Diagnosis not present

## 2022-11-21 DIAGNOSIS — E662 Morbid (severe) obesity with alveolar hypoventilation: Secondary | ICD-10-CM | POA: Diagnosis not present

## 2022-11-21 DIAGNOSIS — L98499 Non-pressure chronic ulcer of skin of other sites with unspecified severity: Secondary | ICD-10-CM | POA: Insufficient documentation

## 2022-11-21 DIAGNOSIS — I132 Hypertensive heart and chronic kidney disease with heart failure and with stage 5 chronic kidney disease, or end stage renal disease: Secondary | ICD-10-CM | POA: Insufficient documentation

## 2022-11-21 DIAGNOSIS — E11622 Type 2 diabetes mellitus with other skin ulcer: Secondary | ICD-10-CM | POA: Diagnosis not present

## 2022-11-21 DIAGNOSIS — I5032 Chronic diastolic (congestive) heart failure: Secondary | ICD-10-CM | POA: Diagnosis not present

## 2022-11-21 DIAGNOSIS — E1142 Type 2 diabetes mellitus with diabetic polyneuropathy: Secondary | ICD-10-CM | POA: Insufficient documentation

## 2022-11-21 DIAGNOSIS — Z6841 Body Mass Index (BMI) 40.0 and over, adult: Secondary | ICD-10-CM | POA: Insufficient documentation

## 2022-11-22 ENCOUNTER — Ambulatory Visit: Payer: Medicare PPO

## 2022-11-22 NOTE — Progress Notes (Signed)
[name], [name] ([id number]) [id number] Page 1 of 9 Visit Report for 11/21/2022 Chief Complaint Document Details Patient Name: Date of Service: [name], [name] 11/21/2022 7:45 A M Medical Record Number: [id number] Patient Account Number: [id number] Date of Birth/Sex: Treating RN: 1956/05/04 (67 y.o. M) Primary Care Provider: [name] Other Clinician: Referring Provider: Treating Provider/Extender: [name] in Treatment: 13 Information Obtained from: Patient Chief Complaint 11/09/2018; patient is here for review of wounds on his dorsal right first and second toes and on the dorsal left first toe. 02/22/2022: The patient is here for a new ulcer on his posterior left heel. 08/18/2022: here with new wound on abdomen, similar to prior Electronic Signature(s) Signed: 11/21/2022 7:56:36 AM By: Fredirick Maudlin MD FACS Entered By: Fredirick Maudlin on 11/21/2022 07:56:36 -------------------------------------------------------------------------------- HPI Details Patient Name: Date of Service: [name], [name] 11/21/2022 7:45 A M Medical Record Number: [id number] Patient Account Number: [id number] Date of Birth/Sex: Treating RN: Apr 23, 1956 (67 y.o. M) Primary Care Provider: [name] Other Clinician: Referring Provider: Treating Provider/Extender: [name] in Treatment: 13 History of Present Illness HPI Description: READMISSION 11/09/2018 This is a now 67 year old man that we had in this clinic over a multitude of years but most recently in 2015. He had wounds on his plantar foot initially in 2007 and 2008 and I think subsequently was seen in 2012 then 2014 into the mid part of 2015 with wounds on his toes. He is a type II diabetic with peripheral neuropathy but no known PAD. The patient states he was doing well until about a month ago he noted an area on the left great toe which was superficial and then an  area on the right great and second toes about a week later. He is not sure how this happened he simply noticed this when he was lying in bed at night. He has been using peroxide. He has not seen a doctor. He has not been on antibiotics and has had no x-rays. Past medical history; type 2 diabetes with peripheral neuropathy, hypertension, hypothyroidism, low B12 levels, iron deficiency anemia, ventral hernia, diabetic nephropathy, chronic diastolic heart failure and obstructive sleep apnea. ABIs in this clinic were 1.2 on the right and 1.1 on the left 2/28; patient arrived last week for areas on his left first right first and right second toes. X-ray of the right foot did not show any osseous abnormality. Culture of the right great toe showed Staphylococcus aureus methicillin sensitive and he is on dicloxacillin which he started 3 days ago. 3/6; patient with wounds on his right first and second toes and left first toes. The area over the left first toe is extensive we have been using silver alginate. Culture grew MSSA and he is on dicloxacillin which I prescribed on the 25th. He is still taking this which I am not really sure of the reason. He expressed knowledge that this was 4 times a day he should have been out of this around March 2 if he picked it up on the 25th or they gave him the right number of pills. Nevertheless our nurses report more purulent drainage and odor 3/17-Patient returns for the 3 wounds the left first toe and the right first and second toe dorsal surfaces. The left first toe is closed up and healed, the right first toe dorsal surface wound is extensive and appears the same as last time, no purulent drainage or odor reported per nurse, left second toe wound is slightly smaller. He  has completed the dicloxacillin as of today. Noted that his culture previously had grown MSSA. We have been using calcium alginate to the wounds. X-rays have been reviewed. ABIs were reviewed as  well. 3/27; patient returns for review of wounds on his right first and right second toes. The wound on the left first toe is healed. 4/10; 2-week follow-up. The area on the right second toe dorsally is healed. Per the patient the left first toe remains healed. He still has an extensive area dorsally over the right first toe inner phalangeal joint area. Rolled up hyper granulated tissue with probably a nonviable surface 4/24; 2-week follow-up. The areas on the right second toe dorsally and right first toe dorsally is healed. The area on the dorsal left first toe over the inner phalangeal joint is smaller but still hyper granulated we have been using Hydrofera Blue 5/15; 3-week follow-up. The areas on the right first toe. Not the left as I said on 4/24. He is using Hydrofera Blue. 5/29; 3-week follow-up. The area on his dorsal right toe over the right inter phalangeal joint is just about closed. We have been using Hydrofera Blue. In passing he showed me his right thumb which is exquisitely tender over the tip of the digit. 6/5; the area on the dorsal right toe over the inner phalangeal joint is closed. Right thumb is better which I gave him antibiotics for last week. Still some tenderness but considerably improved [name], [name] ([id number]) [phone].pdf Page 2 of 9 02/22/2022 This is a 67 year old type II diabetic with congestive heart failure, end-stage renal disease on hemodialysis, peripheral artery disease, and morbid obesity. He has developed an ulcer on his left posterior heel that he believes is secondary to his health assistance not adequately moisturizing his feet. He says it first appeared about 2 weeks ago. He does not have sensation in his feet. He has not noticed any foul odor or drainage. His last hemoglobin A1c was 7.9 at the beginning of January. ABI in clinic today was 1.15. 03/09/2022: He has a new wound on his abdomen that he says started out as  a lump and then the skin broke open when he bumped it on a portion of his hospital bed at home. There is fat exposed. The wound on his heel is a little bit smaller but still has a very pale surface. There is periwound callus accumulation. 03/30/2022: He had another small wound opened up on his abdomen. He says that he thinks these are starting as blisters. He has been on Mounjaro for weight loss and it looks like the loose skin resulting from losing weight might be rubbing and causing small friction injuries. The wound that I treated with chemical cauterization last visit is much smaller; the new wound has a very similar appearance to this. His heel ulcer is smaller with just a little bit of slough buildup on the surface. He continues to have fairly impressive periwound callus formation. 04/07/2022: Both abdominal wounds are smaller today and there is less hypertrophic granulation tissue protruding. The wound on his heel also continues to contract. There is just a layer of slough on the surface. No concern for infection. 04/29/2022: The previous 2 abdominal wounds have closed but he has a new 1 that appears identical to the previous lesions. The wound on his heel is smaller but has a thick layer of callus overlying it. 05/13/2022: His heel wound has healed. The wound on his abdomen remains small but still has some hypertrophic  granulation tissue present. He says it drains serous fluid from time to time. READMISSION 08/18/2022: Mr. Bodkins returns with reopening of the small wound on his abdomen. He said it closed and then he recently went swimming and later that day, he noted some serosanguineous drainage on his shirt. He has not had any fevers or chills. The drainage has never been purulent or foul-smelling. As per his previous admission, the wound is small and circular with fat exposed. No concern for infection. 12/14; the patient's original wound that he came in with has healed and is epithelialized  however just below this he has another open area. He noticed this 3 days ago with a significant amount of sanguinous drainage. No pain no obvious purulence. 09/07/2022: The wound is shallower this week. No purulent drainage identified. The culture that was taken grew out just a small amount of Staph aureus pansensitive except to ciprofloxacin. No antibiotic was prescribed by our clinic, but apparently the patient's primary care provider prescribed an antibiotic, but he does not recall the name and it is not available in the electronic medical record. 09/29/2022: Apparently the patient's home health providers have not been packing the wound. It now probes to a depth of 2.4 cm. It is clean without any slough or purulent drainage. No concern for infection. 10/24/2022: No real change to the wound. There is still about the same amount of depth. The orifice is narrow and this seems to be complicating appropriate packing of the site. 11/07/2022: The existing wound closed and the previous one reopened. It probes to 2.7 cm. No purulent drainage or malodor. 11/21/2022: The depth of the wound is a little bit less today. The opening to the wound is quite narrow. He has an appointment with [facility] surgery coming up on March 21. No concern for infection. Electronic Signature(s) Signed: 11/21/2022 7:57:27 AM By: Fredirick Maudlin MD FACS Entered By: Fredirick Maudlin on 11/21/2022 07:57:27 -------------------------------------------------------------------------------- Physical Exam Details Patient Name: Date of Service: [name], [place] 11/21/2022 7:45 A M Medical Record Number: [id number] Patient Account Number: [id number] Date of Birth/Sex: Treating RN: Jan 29, 1956 (67 y.o. M) Primary Care Provider: [name] Other Clinician: Referring Provider: Treating Provider/Extender: [name] in Treatment: 13 Constitutional . Slightly bradycardic. . . no acute  distress. Respiratory Normal work of breathing on room air. Notes 11/21/2022: The depth of the wound is a little bit less today. The opening to the wound is quite narrow. Electronic Signature(s) Signed: 11/21/2022 7:58:59 AM By: Fredirick Maudlin MD FACS Entered By: Fredirick Maudlin on 11/21/2022 07:58:59 Physician Orders Details -------------------------------------------------------------------------------- [name] ([id number]) [id number] Page 3 of 9 Patient Name: Date of Service: LAMAREON, SPORT 11/21/2022 7:45 A M Medical Record Number: [id number] Patient Account Number: [id number] Date of Birth/Sex: Treating RN: 10/22/1955 (67 y.o. [name] Primary Care Provider: [name] Other Clinician: Referring Provider: Treating Provider/Extender: [name] in Treatment: [phone] Verbal / Phone Orders: No Diagnosis Coding ICD-10 Coding Code Description L98.499 Non-pressure chronic ulcer of skin of other sites with unspecified severity E66.2 Morbid (severe) obesity with alveolar hypoventilation I73.9 Peripheral vascular disease, unspecified 99991111 Chronic diastolic (congestive) heart failure E11.622 Type 2 diabetes mellitus with other skin ulcer Follow-up Appointments ppointment in 2 weeks. - Dr. Celine Ahr Rm 2 Return A Bathing/ Shower/ Hygiene May shower and wash wound with soap and water. - Change dressing every other day Home Health No change in wound care orders this week; continue [facility] for wound care. May utilize  formulary equivalent dressing for wound treatment orders unless otherwise specified. Dressing changes to be completed by [facility] on Monday / Wednesday / Friday except when patient has scheduled visit at [facility]. Other Home Health Orders/Instructions: - [facility] Wound Treatment Wound #25 - Abdomen - midline Wound Laterality: Superior Cleanser: Soap and Water Discharge Instructions: May  shower and wash wound with dial antibacterial soap and water prior to dressing change. Cleanser: Wound Cleanser Discharge Instructions: Cleanse the wound with wound cleanser prior to applying a clean dressing using gauze sponges, not tissue or cotton balls. Prim Dressing: Iodoform packing strip 1/4 (in) ary Discharge Instructions: Lightly pack as instructed Secondary Dressing: Zetuvit Plus Silicone Border Dressing 4x4 (in/in) Discharge Instructions: Apply silicone border over primary dressing as directed. Electronic Signature(s) Signed: 11/21/2022 7:59:16 AM By: Fredirick Maudlin MD FACS Entered By: Fredirick Maudlin on 11/21/2022 07:59:15 -------------------------------------------------------------------------------- Problem List Details Patient Name: Date of Service: [name], Shahir 11/21/2022 7:45 A M Medical Record Number: [id number] Patient Account Number: [id number] Date of Birth/Sex: Treating RN: 12/07/1955 (67 y.o. M) Primary Care Provider: [name] Other Clinician: Referring Provider: Treating Provider/Extender: [name] in Treatment: 13 Active Problems ICD-10 Encounter Code Description Active Date MDM Diagnosis L98.499 Non-pressure chronic ulcer of skin of other sites with unspecified severity 08/18/2022 No Yes E66.2 Morbid (severe) obesity with alveolar hypoventilation 08/18/2022 No Yes [name], [place] ([id number]) [id number] Page 4 of 9 I73.9 Peripheral vascular disease, unspecified 08/18/2022 No Yes [id number] Chronic diastolic (congestive) heart failure 08/18/2022 No Yes E11.622 Type 2 diabetes mellitus with other skin ulcer 08/18/2022 No Yes Inactive Problems Resolved Problems Electronic Signature(s) Signed: 11/21/2022 7:56:15 AM By: Fredirick Maudlin MD FACS Entered By: Fredirick Maudlin on 11/21/2022 07:56:15 -------------------------------------------------------------------------------- Progress Note  Details Patient Name: Date of Service: [name], [name] 11/21/2022 7:45 A M Medical Record Number: [id number] Patient Account Number: [id number] Date of Birth/Sex: Treating RN: April 26, 1956 (67 y.o. M) Primary Care Provider: [name] Other Clinician: Referring Provider: Treating Provider/Extender: [name] in Treatment: 13 Subjective Chief Complaint Information obtained from Patient 11/09/2018; patient is here for review of wounds on his dorsal right first and second toes and on the dorsal left first toe. 02/22/2022: The patient is here for a new ulcer on his posterior left heel. 08/18/2022: here with new wound on abdomen, similar to prior History of Present Illness (HPI) READMISSION 11/09/2018 This is a now 67 year old man that we had in this clinic over a multitude of years but most recently in 2015. He had wounds on his plantar foot initially in 2007 and 2008 and I think subsequently was seen in 2012 then 2014 into the mid part of 2015 with wounds on his toes. He is a type II diabetic with peripheral neuropathy but no known PAD. The patient states he was doing well until about a month ago he noted an area on the left great toe which was superficial and then an area on the right great and second toes about a week later. He is not sure how this happened he simply noticed this when he was lying in bed at night. He has been using peroxide. He has not seen a doctor. He has not been on antibiotics and has had no x-rays. Past medical history; type 2 diabetes with peripheral neuropathy, hypertension, hypothyroidism, low B12 levels, iron deficiency anemia, ventral hernia, diabetic nephropathy, chronic diastolic heart failure and obstructive sleep apnea. ABIs in this clinic were 1.2 on the right and 1.1 on the left  2/28; patient arrived last week for areas on his left first right first and right second toes. X-ray of the right foot did not show any osseous abnormality.  Culture of the right great toe showed Staphylococcus aureus methicillin sensitive and he is on dicloxacillin which he started 3 days ago. 3/6; patient with wounds on his right first and second toes and left first toes. The area over the left first toe is extensive we have been using silver alginate. Culture grew MSSA and he is on dicloxacillin which I prescribed on the 25th. He is still taking this which I am not really sure of the reason. He expressed knowledge that this was 4 times a day he should have been out of this around March 2 if he picked it up on the 25th or they gave him the right number of pills. Nevertheless our nurses report more purulent drainage and odor 3/17-Patient returns for the 3 wounds the left first toe and the right first and second toe dorsal surfaces. The left first toe is closed up and healed, the right first toe dorsal surface wound is extensive and appears the same as last time, no purulent drainage or odor reported per nurse, left second toe wound is slightly smaller. He has completed the dicloxacillin as of today. Noted that his culture previously had grown MSSA. We have been using calcium alginate to the wounds. X-rays have been reviewed. ABIs were reviewed as well. 3/27; patient returns for review of wounds on his right first and right second toes. The wound on the left first toe is healed. 4/10; 2-week follow-up. The area on the right second toe dorsally is healed. Per the patient the left first toe remains healed. He still has an extensive area dorsally over the right first toe inner phalangeal joint area. Rolled up hyper granulated tissue with probably a nonviable surface 4/24; 2-week follow-up. The areas on the right second toe dorsally and right first toe dorsally is healed. The area on the dorsal left first toe over the inner phalangeal joint is smaller but still hyper granulated we have been using Hydrofera Blue 5/15; 3-week follow-up. The areas on the right  first toe. Not the left as I said on 4/24. He is using Hydrofera Blue. 5/29; 3-week follow-up. The area on his dorsal right toe over the right inter phalangeal joint is just about closed. We have been using Hydrofera Blue. In passing he showed me his right thumb which is exquisitely tender over the tip of the digit. 6/5; the area on the dorsal right toe over the inner phalangeal joint is closed. Right thumb is better which I gave him antibiotics for last week. Still some tenderness but considerably improved [name], [name] ([id number]) [phone].pdf Page 5 of 9 02/22/2022 This is a 67 year old type II diabetic with congestive heart failure, end-stage renal disease on hemodialysis, peripheral artery disease, and morbid obesity. He has developed an ulcer on his left posterior heel that he believes is secondary to his health assistance not adequately moisturizing his feet. He says it first appeared about 2 weeks ago. He does not have sensation in his feet. He has not noticed any foul odor or drainage. His last hemoglobin A1c was 7.9 at the beginning of January. ABI in clinic today was 1.15. 03/09/2022: He has a new wound on his abdomen that he says started out as a lump and then the skin broke open when he bumped it on a portion of his hospital bed at home. There  is fat exposed. The wound on his heel is a little bit smaller but still has a very pale surface. There is periwound callus accumulation. 03/30/2022: He had another small wound opened up on his abdomen. He says that he thinks these are starting as blisters. He has been on Mounjaro for weight loss and it looks like the loose skin resulting from losing weight might be rubbing and causing small friction injuries. The wound that I treated with chemical cauterization last visit is much smaller; the new wound has a very similar appearance to this. His heel ulcer is smaller with just a little bit of slough buildup  on the surface. He continues to have fairly impressive periwound callus formation. 04/07/2022: Both abdominal wounds are smaller today and there is less hypertrophic granulation tissue protruding. The wound on his heel also continues to contract. There is just a layer of slough on the surface. No concern for infection. 04/29/2022: The previous 2 abdominal wounds have closed but he has a new 1 that appears identical to the previous lesions. The wound on his heel is smaller but has a thick layer of callus overlying it. 05/13/2022: His heel wound has healed. The wound on his abdomen remains small but still has some hypertrophic granulation tissue present. He says it drains serous fluid from time to time. READMISSION 08/18/2022: Mr. Jagiello returns with reopening of the small wound on his abdomen. He said it closed and then he recently went swimming and later that day, he noted some serosanguineous drainage on his shirt. He has not had any fevers or chills. The drainage has never been purulent or foul-smelling. As per his previous admission, the wound is small and circular with fat exposed. No concern for infection. 12/14; the patient's original wound that he came in with has healed and is epithelialized however just below this he has another open area. He noticed this 3 days ago with a significant amount of sanguinous drainage. No pain no obvious purulence. 09/07/2022: The wound is shallower this week. No purulent drainage identified. The culture that was taken grew out just a small amount of Staph aureus pansensitive except to ciprofloxacin. No antibiotic was prescribed by our clinic, but apparently the patient's primary care provider prescribed an antibiotic, but he does not recall the name and it is not available in the electronic medical record. 09/29/2022: Apparently the patient's home health providers have not been packing the wound. It now probes to a depth of 2.4 cm. It is clean without any  slough or purulent drainage. No concern for infection. 10/24/2022: No real change to the wound. There is still about the same amount of depth. The orifice is narrow and this seems to be complicating appropriate packing of the site. 11/07/2022: The existing wound closed and the previous one reopened. It probes to 2.7 cm. No purulent drainage or malodor. 11/21/2022: The depth of the wound is a little bit less today. The opening to the wound is quite narrow. He has an appointment with [facility] b[facility] surgery coming up on March 21. No concern for infection. Patient History Information obtained from Patient. Family History Cancer - Mother,Father, Diabetes - Mother,Father,Siblings, Heart Disease - Siblings, Lung Disease - Mother, No family history of Hereditary Spherocytosis, Hypertension, Kidney Disease, Seizures, Stroke, Thyroid Problems, Tuberculosis. Social History Never smoker, Marital Status - Single, Alcohol Use - Never, Drug Use - No History, Caffeine Use - Daily - Coffee. Medical History Eyes Denies history of Cataracts, Glaucoma, Optic Neuritis Ear/Nose/Mouth/Throat Denies history of  Chronic sinus problems/congestion, Middle ear problems Hematologic/Lymphatic Denies history of Anemia, Hemophilia, Human Immunodeficiency Virus, Lymphedema, Sickle Cell Disease Respiratory Patient has history of Sleep Apnea Denies history of Aspiration, Asthma, Chronic Obstructive Pulmonary Disease (COPD), Pneumothorax, Tuberculosis Cardiovascular Patient has history of Congestive Heart Failure, Hypertension Denies history of Angina, Arrhythmia, Coronary Artery Disease, Deep Vein Thrombosis, Hypotension, Myocardial Infarction, Peripheral Arterial Disease, Peripheral Venous Disease, Phlebitis, Vasculitis Gastrointestinal Denies history of Cirrhosis , Colitis, Crohnoos, Hepatitis A, Hepatitis B, Hepatitis C Endocrine Patient has history of Type II Diabetes Denies history of Type I  Diabetes Genitourinary Denies history of End Stage Renal Disease Immunological Denies history of Lupus Erythematosus, Raynaudoos, Scleroderma Integumentary (Skin) Denies history of History of Burn Musculoskeletal Patient has history of Osteoarthritis Denies history of Gout, Rheumatoid Arthritis, Osteomyelitis Neurologic Patient has history of Neuropathy - Diabetic Denies history of Dementia, Quadriplegia, Paraplegia, Seizure Disorder Oncologic Denies history of Received Chemotherapy, Received Radiation Psychiatric [name], [name] ([id number]) [phone].pdf Page 6 of 9 Denies history of Anorexia/bulimia, Confinement Anxiety Objective Constitutional Slightly bradycardic. no acute distress. Vitals Time Taken: 7:47 AM, Height: 72 in, Weight: 400 lbs, BMI: 54.2, Temperature: 97.9 F, Pulse: 58 bpm, Respiratory Rate: 16 breaths/min, Blood Pressure: 107/62 mmHg, Capillary Blood Glucose: 111 mg/dl. Respiratory Normal work of breathing on room air. General Notes: 11/21/2022: The depth of the wound is a little bit less today. The opening to the wound is quite narrow. Integumentary (Hair, Skin) Wound #25 status is Open. Original cause of wound was Gradually Appeared. The date acquired was: 11/07/2022. The wound has been in treatment 2 weeks. The wound is located on the Superior Abdomen - midline. The wound measures 0.2cm length x 0.2cm width x 1.7cm depth; 0.031cm^2 area and 0.053cm^3 volume. There is Fat Layer (Subcutaneous Tissue) exposed. There is no tunneling or undermining noted. There is a medium amount of serosanguineous drainage noted. The wound margin is distinct with the outline attached to the wound base. There is large (67-100%) red granulation within the wound bed. There is no necrotic tissue within the wound bed. The periwound skin appearance had no abnormalities noted for texture. The periwound skin appearance had no abnormalities noted for moisture. The  periwound skin appearance had no abnormalities noted for color. Periwound temperature was noted as No Abnormality. 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 - Dr. Celine Ahr Rm 2 Bathing/ Shower/ Hygiene: May shower and wash wound with soap and water. - Change dressing every other day Home Health: No change in wound care orders this week; continue [place] for wound care. May utilize formulary equivalent dressing for wound treatment orders unless otherwise specified. Dressing changes to be completed by [place] on Monday / Wednesday / Friday except when patient has scheduled visit at [facility]. Other Home Health Orders/Instructions: - [facility] WOUND #25: - Abdomen - midline Wound Laterality: Superior Cleanser: Soap and Water Discharge Instructions: May shower and wash wound with dial antibacterial soap and water prior to dressing change. Cleanser: Wound Cleanser Discharge Instructions: Cleanse the wound with wound cleanser prior to applying a clean dressing using gauze sponges, not tissue or cotton balls. Prim Dressing: Iodoform packing strip 1/4 (in) ary Discharge Instructions: Lightly pack as instructed Secondary Dressing: Zetuvit Plus Silicone Border Dressing 4x4 (in/in) Discharge Instructions: Apply silicone border  over primary dressing as directed. 11/21/2022: The depth of the wound is a little bit less today. The opening to the wound is quite narrow. No debridement was necessary today. We will continue to pack the tract with iodoform packing strips. I am a little bit at a loss for any other potential etiology aside from the Lap-Band port and I am hopeful that the  bariatric surgeons at [facility] will have an answer and solution for him. Follow-up here in 2 weeks' time. Electronic Signature(s) [place], [place] ([id number]) [id number] Page 7 of 9 Signed: 11/21/2022 8:00:16 AM By: Fredirick Maudlin MD FACS Entered By: Fredirick Maudlin on 11/21/2022 08:00:16 -------------------------------------------------------------------------------- HxROS Details Patient Name: Date of Service: [name], [name] 11/21/2022 7:45 A M Medical Record Number: [id number] Patient Account Number: [id number] Date of Birth/Sex: Treating RN: 07/20/1956 (67 y.o. M) Primary Care Provider: [name] Other Clinician: Referring Provider: Treating Provider/Extender: [name] in Treatment: 13 Information Obtained From Patient Eyes Medical History: Negative for: Cataracts; Glaucoma; Optic Neuritis Ear/Nose/Mouth/Throat Medical History: Negative for: Chronic sinus problems/congestion; Middle ear problems Hematologic/Lymphatic Medical History: Negative for: Anemia; Hemophilia; Human Immunodeficiency Virus; Lymphedema; Sickle Cell Disease Respiratory Medical History: Positive for: Sleep Apnea Negative for: Aspiration; Asthma; Chronic Obstructive Pulmonary Disease (COPD); Pneumothorax; Tuberculosis Cardiovascular Medical History: Positive for: Congestive Heart Failure; Hypertension Negative for: Angina; Arrhythmia; Coronary Artery Disease; Deep Vein Thrombosis; Hypotension; Myocardial Infarction; Peripheral Arterial Disease; Peripheral Venous Disease; Phlebitis; Vasculitis Gastrointestinal Medical History: Negative for: Cirrhosis ; Colitis; Crohns; Hepatitis A; Hepatitis B; Hepatitis C Endocrine Medical History: Positive for: Type II Diabetes Negative for: Type I Diabetes Time with diabetes: 1994 Treated with: Insulin Blood sugar tested every day: No Genitourinary Medical History: Negative for: End Stage Renal  Disease Immunological Medical History: Negative for: Lupus Erythematosus; Raynauds; Scleroderma Integumentary (Skin) Medical History: Negative for: History of Burn Musculoskeletal Medical HistoryENZIO, [name] ([id number]) [id number] Page 8 of 9 Positive for: Osteoarthritis Negative for: Gout; Rheumatoid Arthritis; Osteomyelitis Neurologic Medical History: Positive for: Neuropathy - Diabetic Negative for: Dementia; Quadriplegia; Paraplegia; Seizure Disorder Oncologic Medical History: Negative for: Received Chemotherapy; Received Radiation Psychiatric Medical History: Negative for: Anorexia/bulimia; Confinement Anxiety Immunizations Pneumococcal Vaccine: Received Pneumococcal Vaccination: No Implantable Devices No devices added Family and Social History Cancer: Yes - Mother,Father; Diabetes: Yes - Mother,Father,Siblings; Heart Disease: Yes - Siblings; Hereditary Spherocytosis: No; Hypertension: No; Kidney Disease: No; Lung Disease: Yes - Mother; Seizures: No; Stroke: No; Thyroid Problems: No; Tuberculosis: No; Never smoker; Marital Status - Single; Alcohol Use: Never; Drug Use: No History; Caffeine Use: Daily - Coffee; Financial Concerns: No; Food, Clothing or Shelter Needs: No; Support System Lacking: No; Transportation Concerns: No Electronic Signature(s) Signed: 11/21/2022 12:33:57 PM By: Fredirick Maudlin MD FACS Entered By: Fredirick Maudlin on 11/21/2022 07:58:14 -------------------------------------------------------------------------------- SuperBill Details Patient Name: Date of Service: [name], [name] 11/21/2022 Medical Record Number: [id number] Patient Account Number: [id number] Date of Birth/Sex: Treating RN: 24-Dec-1955 (67 y.o. M) Primary Care Provider: [name] Other Clinician: Referring Provider: Treating Provider/Extender: [name] in Treatment: 13 Diagnosis Coding ICD-10 Codes Code  Description L98.499 Non-pressure chronic ulcer of skin of other sites with unspecified severity E66.2 Morbid (severe) obesity with alveolar hypoventilation I73.9 Peripheral vascular disease, unspecified 99991111 Chronic diastolic (congestive) heart failure E11.622 Type 2 diabetes mellitus with other skin ulcer Facility Procedures : CPT4 Code: [id number] Description: 99213 - WOUND CARE VISIT-LEV 3 EST PT Modifier: Quantity: 1 Physician Procedures : CPT4 Code Description Modifier [id number] - WC PHYS LEVEL 4 - EST PT  ICD-10 Diagnosis Description L98.499 Non-pressure chronic ulcer of skin of other sites with unspecified severity E11.622 Type 2 diabetes mellitus with other skin ulcer E66.2 Morbid  (severe) obesity with alveolar hypoventilation 99991111 Chronic diastolic (congestive) heart failure [name], [place] ([id number]) [id number] Quantity: 1 Page 9 of 9 Electronic Signature(s) Signed: 11/21/2022 12:33:57 PM By: Fredirick Maudlin MD FACS Signed: 11/21/2022 4:50:16 PM By: Adline Peals Previous Signature: 11/21/2022 8:00:36 AM Version By: Fredirick Maudlin MD FACS Entered By: Adline Peals on 11/21/2022 08:10:03

## 2022-11-22 NOTE — Progress Notes (Signed)
[name], [place] ([id number]) [id number] Page 1 of 7 Visit Report for 11/21/2022 Arrival Information Details Patient Name: Date of Service: CHANEY, SY 11/21/2022 7:45 A M Medical Record Number: [id number] Patient Account Number: [id number] Date of Birth/Sex: Treating RN: Sep 21, 1955 (67 y.o. [name] Primary Care Jesseka Drinkard: [name] Other Clinician: Referring Tyshawna Alarid: Treating Aveya Beal/Extender: [name] in Treatment: 13 Visit Information History Since Last Visit Added or deleted any medications: No Patient Arrived: Wheel Chair Any new allergies or adverse reactions: No Arrival Time: 07:43 Had a fall or experienced change in No Accompanied By: self activities of daily living that may affect Transfer Assistance: None risk of falls: Patient Identification Verified: Yes Signs or symptoms of abuse/neglect since last visito No Secondary Verification Process Completed: Yes Hospitalized since last visit: No Implantable device outside of the clinic excluding No cellular tissue based products placed in the center since last visit: Has Dressing in Place as Prescribed: Yes Pain Present Now: No Electronic Signature(s) Signed: 11/21/2022 4:50:16 PM By: Adline Peals Entered By: Adline Peals on 11/21/2022 07:47:32 -------------------------------------------------------------------------------- Clinic Level of Care Assessment Details Patient Name: Date of Service: TOLAN, [place] 11/21/2022 7:45 A M Medical Record Number: [id number] Patient Account Number: [id number] Date of Birth/Sex: Treating RN: [birth year] (67 y.o. [name] Primary Care Hanya Guerin: [name] Other Clinician: Referring Lamanda Rudder: Treating Siyana Erney/Extender: [name] in Treatment: 13 Clinic Level of Care Assessment Items TOOL 4 Quantity Score X- 1 0 Use when only an EandM is performed on FOLLOW-UP  visit ASSESSMENTS - Nursing Assessment / Reassessment X- 1 10 Reassessment of Co-morbidities (includes updates in patient status) X- 1 5 Reassessment of Adherence to Treatment Plan ASSESSMENTS - Wound and Skin A ssessment / Reassessment X - Simple Wound Assessment / Reassessment - one wound 1 5 '[]'$  - 0 Complex Wound Assessment / Reassessment - multiple wounds '[]'$  - 0 Dermatologic / Skin Assessment (not related to wound area) ASSESSMENTS - Focused Assessment '[]'$  - 0 Circumferential Edema Measurements - multi extremities '[]'$  - 0 Nutritional Assessment / Counseling / Intervention '[]'$  - 0 Lower Extremity Assessment (monofilament, tuning fork, pulses) '[]'$  - 0 Peripheral Arterial Disease Assessment (using hand held doppler) ASSESSMENTS - Ostomy and/or Continence Assessment and Care '[]'$  - 0 Incontinence Assessment and Management '[]'$  - 0 Ostomy Care Assessment and Management (repouching, etc.) PROCESS - Coordination of Care X - Simple Patient / Family Education for ongoing care 1 15 [place], [place] ([id number]) [id number].pdf Page 2 of 7 '[]'$  - 0 Complex (extensive) Patient / Family Education for ongoing care X- 1 10 Staff obtains Consents, Records, T Results / Process Orders est '[]'$  - 0 Staff telephones HHA, Nursing Homes / Clarify orders / etc '[]'$  - 0 Routine Transfer to another Facility (non-emergent condition) '[]'$  - 0 Routine Hospital Admission (non-emergent condition) '[]'$  - 0 New Admissions / Biomedical engineer / Ordering NPWT Apligraf, etc. , '[]'$  - 0 Emergency Hospital Admission (emergent condition) X- 1 10 Simple Discharge Coordination '[]'$  - 0 Complex (extensive) Discharge Coordination PROCESS - Special Needs '[]'$  - 0 Pediatric / Minor Patient Management '[]'$  - 0 Isolation Patient Management '[]'$  - 0 Hearing / Language / Visual special needs '[]'$  - 0 Assessment of Community assistance (transportation, D/C planning, etc.) '[]'$  - 0 Additional assistance /  Altered mentation '[]'$  - 0 Support Surface(s) Assessment (bed, cushion, seat, etc.) INTERVENTIONS - Wound Cleansing / Measurement X - Simple Wound Cleansing - one wound 1 5 '[]'$  - 0 Complex Wound Cleansing -  multiple wounds X- 1 5 Wound Imaging (photographs - any number of wounds) '[]'$  - 0 Wound Tracing (instead of photographs) X- 1 5 Simple Wound Measurement - one wound '[]'$  - 0 Complex Wound Measurement - multiple wounds INTERVENTIONS - Wound Dressings X - Small Wound Dressing one or multiple wounds 1 10 '[]'$  - 0 Medium Wound Dressing one or multiple wounds '[]'$  - 0 Large Wound Dressing one or multiple wounds '[]'$  - 0 Application of Medications - topical '[]'$  - 0 Application of Medications - injection INTERVENTIONS - Miscellaneous '[]'$  - 0 External ear exam '[]'$  - 0 Specimen Collection (cultures, biopsies, blood, body fluids, etc.) '[]'$  - 0 Specimen(s) / Culture(s) sent or taken to Lab for analysis '[]'$  - 0 Patient Transfer (multiple staff / Civil Service fast streamer / Similar devices) '[]'$  - 0 Simple Staple / Suture removal (25 or less) '[]'$  - 0 Complex Staple / Suture removal (26 or more) '[]'$  - 0 Hypo / Hyperglycemic Management (close monitor of Blood Glucose) '[]'$  - 0 Ankle / Brachial Index (ABI) - do not check if billed separately X- 1 5 Vital Signs Has the patient been seen at the hospital within the last three years: Yes Total Score: 85 Level Of Care: New/Established - Level 3 Electronic Signature(s) Signed: 11/21/2022 4:50:16 PM By: Adline Peals Entered By: Adline Peals on 11/21/2022 08:09:49 [place], [place] ([id number] Page 3 of 7 -------------------------------------------------------------------------------- Encounter Discharge Information Details Patient Name: Date of Service: BRYDAN, DEVARY 11/21/2022 7:45 A M Medical Record Number: [id number] Patient Account Number: [id number] Date of Birth/Sex: Treating RN: 25-Dec-1955 (67 y.o. [name] Primary Care Kirsi Hugh: [name] Other Clinician: Referring Chyla Schlender: Treating Madeleyn Schwimmer/Extender: [name] in Treatment: 13 Encounter Discharge Information Items Discharge Condition: Stable Ambulatory Status: Wheelchair Discharge Destination: Home Transportation: Private Auto Accompanied By: self Schedule Follow-up Appointment: Yes Clinical Summary of Care: Patient Declined Electronic Signature(s) Signed: 11/21/2022 4:50:16 PM By: Sabas Sous By: Adline Peals on 11/21/2022 08:10:27 -------------------------------------------------------------------------------- Lower Extremity Assessment Details Patient Name: Date of Service: JATINDER, BUFFKIN 11/21/2022 7:45 A M Medical Record Number: [id number] Patient Account Number: [id number] Date of Birth/Sex: Treating RN: 1955/11/29 (67 y.o. [name] Primary Care Valeen Borys: [name] Other Clinician: Referring Birch Farino: Treating Ree Alcalde/Extender: [name] in Treatment: 13 Electronic Signature(s) Signed: 11/21/2022 4:50:16 PM By: Sabas Sous By: Adline Peals on 11/21/2022 07:48:16 -------------------------------------------------------------------------------- Multi Wound Chart Details Patient Name: Date of Service: [name], Rafeal 11/21/2022 7:45 A M Medical Record Number: [id number] Patient Account Number: [id number] Date of Birth/Sex: Treating RN: 08-08-1956 (67 y.o. M) Primary Care Megon Kalina: [name] Other Clinician: Referring Wojciech Willetts: Treating Lihanna Biever/Extender: [name] in Treatment: 13 Vital Signs Height(in): 72 Capillary Blood Glucose(mg/dl): 111 Weight(lbs): 400 Pulse(bpm): 13 Body Mass Index(BMI): 54.2 Blood Pressure(mmHg): 107/62 Temperature(F): 97.9 Respiratory Rate(breaths/min): 16 [25:Photos:] [N/A:N/A] LEELAN, HOLDEMAN ([id number]) [25:Superior Abdomen -  midline Wound Location: Gradually Appeared Wounding Event: Atypical Primary Etiology: Sleep Apnea, Congestive Heart Comorbid History: Failure, Hypertension, Type II Diabetes, Osteoarthritis, Neuropathy 11/07/2022 Date Acquired: 2  Weeks of Treatment: Open Wound Status: No Wound Recurrence: 0.2x0.2x1.7 Measurements L x W x D (cm) 0.031 A (cm) : rea 0.053 Volume (cm) : 83.50% % Reduction in Area: 89.60% % Reduction in Volume: Full Thickness Without Exposed Classification: Support  Structures Medium Exudate Amount: Serosanguineous Exudate Type: red, brown Exudate Color: Distinct, outline attached Wound Margin: Large (67-100%) Granulation Amount: Red Granulation Quality: None Present (0%) Necrotic Amount: Fat Layer (Subcutaneous  Tissue):  Yes N/A Exposed Structures: Fascia: No Tendon: No Muscle: No Joint: No Bone: No Medium (34-66%) Epithelialization: No Abnormalities Noted Periwound Skin Texture: No Abnormalities Noted Periwound Skin Moisture: No Abnormalities Noted Periwound  Skin Color: No Abnormality Temperature:] [N/A:N/A N/A N/A N/A N/A N/A N/A N/A N/A N/A N/A N/A N/A N/A N/A N/A N/A N/A N/A N/A N/A N/A N/A N/A N/A N/A] Treatment Notes Electronic Signature(s) Signed: 11/21/2022 7:56:22 AM By: Fredirick Maudlin MD FACS Entered By: Fredirick Maudlin on 11/21/2022 07:56:22 -------------------------------------------------------------------------------- Multi-Disciplinary Care Plan Details Patient Name: Date of Service: [name], Sahir 11/21/2022 7:45 A M Medical Record Number: [id number] Patient Account Number: [id number] Date of Birth/Sex: Treating RN: 09-19-1956 (67 y.o. [name] Primary Care Doral Ventrella: [name] Other Clinician: Referring Miroslava Santellan: Treating Kentravious Lipford/Extender: [name] in Treatment: 13 Active Inactive Abuse / Safety / Falls / Self Care Management Nursing Diagnoses: Impaired physical mobility Potential for  falls Goals: Patient/caregiver will verbalize/demonstrate measure taken to improve self care Date Initiated: 10/24/2022 Target Resolution Date: 12/23/2022 Goal Status: Active Patient/caregiver will verbalize/demonstrate measures taken to improve the patient's personal safety Date Initiated: 10/24/2022 Target Resolution Date: 12/23/2022 Goal Status: Active Interventions: Provide education on fall prevention Notes: Electronic Signature(s) Patterson, Dhyan ([id number]) [phone].pdf Page 5 of 7 Signed: 11/21/2022 4:50:16 PM By: Sabas Sous By: Adline Peals on 11/21/2022 07:55:39 -------------------------------------------------------------------------------- Pain Assessment Details Patient Name: Date of Service: HARVIR, CASTLEBERRY 11/21/2022 7:45 A M Medical Record Number: [id number] Patient Account Number: [id number] Date of Birth/Sex: Treating RN: January 05, 1956 (67 y.o. [name] Primary Care Bettyanne Dittman: [name] Other Clinician: Referring Lunah Losasso: Treating Florentine Diekman/Extender: [name] in Treatment: 13 Active Problems Location of Pain Severity and Description of Pain Patient Has Paino No Site Locations Rate the pain. Current Pain Level: 0 Pain Management and Medication Current Pain Management: Electronic Signature(s) Signed: 11/21/2022 4:50:16 PM By: Adline Peals Entered By: Adline Peals on 11/21/2022 07:48:13 -------------------------------------------------------------------------------- Patient/Caregiver Education Details Patient Name: Date of Service: [name], [place] 3/4/2024andnbsp7:45 [place] Record Number: [id number] Patient Account Number: [id number] Date of Birth/Gender: Treating RN: 1955/12/06 (67 y.o. [name] Primary Care Physician: [name] Other Clinician: Referring Physician: Treating Physician/Extender: [name] in Treatment:  13 Education Assessment Education Provided To: Patient Education Topics Provided Wound/Skin Impairment: Methods: Explain/Verbal Responses: Reinforcements needed, State content correctly Electronic Signature(s) Signed: 11/21/2022 4:50:16 PM By: Alphia Moh, [place] ([id number]) [id number] Page 6 of 7 Entered By: Adline Peals on 11/21/2022 07:55:48 -------------------------------------------------------------------------------- Wound Assessment Details Patient Name: Date of Service: SOKHA, WHINERY 11/21/2022 7:45 A M Medical Record Number: [id number] Patient Account Number: [id number] Date of Birth/Sex: Treating RN: 1956-04-09 (67 y.o. [name] Primary Care Gerado Nabers: [name] Other Clinician: Referring Jaeson Molstad: Treating Kehinde Totzke/Extender: [name] in Treatment: 13 Wound Status Wound Number: 25 Primary Atypical Etiology: Wound Location: Superior Abdomen - midline Wound Open Wounding Event: Gradually Appeared Status: Date Acquired: 11/07/2022 Comorbid Sleep Apnea, Congestive Heart Failure, Hypertension, Type II Weeks Of Treatment: 2 History: Diabetes, Osteoarthritis, Neuropathy Clustered Wound: No Photos Wound Measurements Length: (cm) 0.2 Width: (cm) 0.2 Depth: (cm) 1.7 Area: (cm) 0.031 Volume: (cm) 0.053 % Reduction in Area: 83.5% % Reduction in Volume: 89.6% Epithelialization: Medium (34-66%) Tunneling: No Undermining: No Wound Description Classification: Full Thickness Without Exposed Support Structures Wound Margin: Distinct, outline attached Exudate Amount: Medium Exudate Type: Serosanguineous Exudate Color: red, brown Foul Odor After Cleansing: No Slough/Fibrino Yes Wound Bed Granulation Amount: Large (67-100%) Exposed  Structure Granulation Quality: Red Fascia Exposed: No Necrotic Amount: None Present (0%) Fat Layer (Subcutaneous Tissue) Exposed: Yes Tendon  Exposed: No Muscle Exposed: No Joint Exposed: No Bone Exposed: No Periwound Skin Texture Texture Color No Abnormalities Noted: Yes No Abnormalities Noted: Yes Moisture Temperature / Pain No Abnormalities Noted: Yes Temperature: No Abnormality Treatment Notes Wound #25 (Abdomen - midline) Wound Laterality: Superior Cleanser Soap and Water Discharge Instruction: May shower and wash wound with dial antibacterial soap and water prior to dressing change. Wound Cleanser Switzer, Tsuneo ([id number]) [id number] Page 7 of 7 Discharge Instruction: Cleanse the wound with wound cleanser prior to applying a clean dressing using gauze sponges, not tissue or cotton balls. Peri-Wound Care Topical Primary Dressing Iodoform packing strip 1/4 (in) Discharge Instruction: Lightly pack as instructed Secondary Dressing Zetuvit Plus Silicone Border Dressing 4x4 (in/in) Discharge Instruction: Apply silicone border over primary dressing as directed. Secured With Compression Wrap Compression Stockings Environmental education officer) Signed: 11/21/2022 4:50:16 PM By: Adline Peals Entered By: Adline Peals on 11/21/2022 07:52:40 -------------------------------------------------------------------------------- Vitals Details Patient Name: Date of Service: [name], Jacek 11/21/2022 7:45 A M Medical Record Number: [id number] Patient Account Number: [id number] Date of Birth/Sex: Treating RN: 04/23/1956 (67 y.o. [name] Primary Care Zakaiya Lares: [name] Other Clinician: Referring Momo Braun: Treating Dara Beidleman/Extender: [name] in Treatment: 13 Vital Signs Time Taken: 07:47 Temperature (F): 97.9 Height (in): 72 Pulse (bpm): 58 Weight (lbs): 400 Respiratory Rate (breaths/min): 16 Body Mass Index (BMI): 54.2 Blood Pressure (mmHg): 107/62 Capillary Blood Glucose (mg/dl): 111 Reference Range: 80 - 120 mg / dl Electronic  Signature(s) Signed: 11/21/2022 4:50:16 PM By: Adline Peals Entered By: Adline Peals on 11/21/2022 07:48:07

## 2022-11-23 ENCOUNTER — Other Ambulatory Visit: Payer: Self-pay | Admitting: Internal Medicine

## 2022-11-23 DIAGNOSIS — E118 Type 2 diabetes mellitus with unspecified complications: Secondary | ICD-10-CM

## 2022-11-23 DIAGNOSIS — E1022 Type 1 diabetes mellitus with diabetic chronic kidney disease: Secondary | ICD-10-CM

## 2022-11-24 ENCOUNTER — Other Ambulatory Visit: Payer: Self-pay | Admitting: Nurse Practitioner

## 2022-11-24 DIAGNOSIS — S31109A Unspecified open wound of abdominal wall, unspecified quadrant without penetration into peritoneal cavity, initial encounter: Secondary | ICD-10-CM

## 2022-11-30 ENCOUNTER — Encounter: Payer: Self-pay | Admitting: Nurse Practitioner

## 2022-12-01 ENCOUNTER — Ambulatory Visit
Admission: RE | Admit: 2022-12-01 | Discharge: 2022-12-01 | Disposition: A | Discharge status: Home / Self Care | Payer: Medicare PPO | Source: Ambulatory Visit | Attending: Nurse Practitioner | Admitting: Nurse Practitioner

## 2022-12-01 DIAGNOSIS — S31109A Unspecified open wound of abdominal wall, unspecified quadrant without penetration into peritoneal cavity, initial encounter: Secondary | ICD-10-CM

## 2022-12-04 ENCOUNTER — Ambulatory Visit (HOSPITAL_BASED_OUTPATIENT_CLINIC_OR_DEPARTMENT_OTHER): Payer: Medicare PPO | Attending: Cardiology | Admitting: Cardiovascular Disease

## 2022-12-04 VITALS — Ht 73.0 in | Wt 395.0 lb

## 2022-12-04 DIAGNOSIS — E662 Morbid (severe) obesity with alveolar hypoventilation: Secondary | ICD-10-CM | POA: Diagnosis not present

## 2022-12-04 DIAGNOSIS — G4731 Primary central sleep apnea: Secondary | ICD-10-CM

## 2022-12-04 DIAGNOSIS — Z6841 Body Mass Index (BMI) 40.0 and over, adult: Secondary | ICD-10-CM | POA: Insufficient documentation

## 2022-12-04 DIAGNOSIS — G4733 Obstructive sleep apnea (adult) (pediatric): Secondary | ICD-10-CM | POA: Diagnosis present

## 2022-12-04 DIAGNOSIS — G4736 Sleep related hypoventilation in conditions classified elsewhere: Secondary | ICD-10-CM | POA: Diagnosis not present

## 2022-12-05 ENCOUNTER — Ambulatory Visit (HOSPITAL_BASED_OUTPATIENT_CLINIC_OR_DEPARTMENT_OTHER): Payer: Medicare PPO | Admitting: Internal Medicine

## 2022-12-13 ENCOUNTER — Encounter (HOSPITAL_BASED_OUTPATIENT_CLINIC_OR_DEPARTMENT_OTHER): Payer: Medicare PPO | Admitting: General Surgery

## 2022-12-13 DIAGNOSIS — E11622 Type 2 diabetes mellitus with other skin ulcer: Secondary | ICD-10-CM | POA: Diagnosis not present

## 2022-12-13 NOTE — Progress Notes (Signed)
[name], [name] ([id number]) [id number] Page 1 of 9 Visit Report for 12/13/2022 Chief Complaint Document Details Patient Name: Date of Service: [name], [name] 12/13/2022 7:30 A M Medical Record Number: [id number] Patient Account Number: [id number] Date of Birth/Sex: Treating RN: 1956/08/27 (66 y.o. M) Primary Care Provider: [name] Other Clinician: Referring Provider: Treating Provider/Extender: [name] in Treatment: 16 Information Obtained from: Patient Chief Complaint 11/09/2018; patient is here for review of wounds on his dorsal right first and second toes and on the dorsal left first toe. 02/22/2022: The patient is here for a new ulcer on his posterior left heel. 08/18/2022: here with new wound on abdomen, similar to prior Electronic Signature(s) Signed: 12/13/2022 8:11:37 AM By: Fredirick Maudlin MD FACS Entered By: Fredirick Maudlin on 12/13/2022 08:11:37 -------------------------------------------------------------------------------- HPI Details Patient Name: Date of Service: [name], [name] 12/13/2022 7:30 A M Medical Record Number: [id number] Patient Account Number: [id number] Date of Birth/Sex: Treating RN: [birth year] (67 y.o. M) Primary Care Provider: [name] Other Clinician: Referring Provider: Treating Provider/Extender: [name] in Treatment: 16 History of Present Illness HPI Description: READMISSION 11/09/2018 This is a now 67 year old man that we had in this clinic over a multitude of years but most recently in 2015. He had wounds on his plantar foot initially in 2007 and 2008 and I think subsequently was seen in 2012 then 2014 into the mid part of 2015 with wounds on his toes. He is a type II diabetic with peripheral neuropathy but no known PAD. The patient states he was doing well until about a month ago he noted an area on the left great toe which was superficial and then  an area on the right great and second toes about a week later. He is not sure how this happened he simply noticed this when he was lying in bed at night. He has been using peroxide. He has not seen a doctor. He has not been on antibiotics and has had no x-rays. Past medical history; type 2 diabetes with peripheral neuropathy, hypertension, hypothyroidism, low B12 levels, iron deficiency anemia, ventral hernia, diabetic nephropathy, chronic diastolic heart failure and obstructive sleep apnea. ABIs in this clinic were 1.2 on the right and 1.1 on the left 2/28; patient arrived last week for areas on his left first right first and right second toes. X-ray of the right foot did not show any osseous abnormality. Culture of the right great toe showed Staphylococcus aureus methicillin sensitive and he is on dicloxacillin which he started 3 days ago. 3/6; patient with wounds on his right first and second toes and left first toes. The area over the left first toe is extensive we have been using silver alginate. Culture grew MSSA and he is on dicloxacillin which I prescribed on the 25th. He is still taking this which I am not really sure of the reason. He expressed knowledge that this was 4 times a day he should have been out of this around March 2 if he picked it up on the 25th or they gave him the right number of pills. Nevertheless our nurses report more purulent drainage and odor 3/17-Patient returns for the 3 wounds the left first toe and the right first and second toe dorsal surfaces. The left first toe is closed up and healed, the right first toe dorsal surface wound is extensive and appears the same as last time, no purulent drainage or odor reported per nurse, left second toe wound is slightly smaller. He  has completed the dicloxacillin as of today. Noted that his culture previously had grown MSSA. We have been using calcium alginate to the wounds. X-rays have been reviewed. ABIs were reviewed as  well. 3/27; patient returns for review of wounds on his right first and right second toes. The wound on the left first toe is healed. 4/10; 2-week follow-up. The area on the right second toe dorsally is healed. Per the patient the left first toe remains healed. He still has an extensive area dorsally over the right first toe inner phalangeal joint area. Rolled up hyper granulated tissue with probably a nonviable surface 4/24; 2-week follow-up. The areas on the right second toe dorsally and right first toe dorsally is healed. The area on the dorsal left first toe over the inner phalangeal joint is smaller but still hyper granulated we have been using Hydrofera Blue 5/15; 3-week follow-up. The areas on the right first toe. Not the left as I said on 4/24. He is using Hydrofera Blue. 5/29; 3-week follow-up. The area on his dorsal right toe over the right inter phalangeal joint is just about closed. We have been using Hydrofera Blue. In passing he showed me his right thumb which is exquisitely tender over the tip of the digit. 6/5; the area on the dorsal right toe over the inner phalangeal joint is closed. Right thumb is better which I gave him antibiotics for last week. Still some tenderness but considerably improved [name], [name] ([id number]) [id number] Page 2 of 9 02/22/2022 This is a 67 year old type II diabetic with congestive heart failure, end-stage renal disease on hemodialysis, peripheral artery disease, and morbid obesity. He has developed an ulcer on his left posterior heel that he believes is secondary to his health assistance not adequately moisturizing his feet. He says it first appeared about 2 weeks ago. He does not have sensation in his feet. He has not noticed any foul odor or drainage. His last hemoglobin A1c was 7.9 at the beginning of January. ABI in clinic today was 1.15. 03/09/2022: He has a new wound on his abdomen that he says started out as  a lump and then the skin broke open when he bumped it on a portion of his hospital bed at home. There is fat exposed. The wound on his heel is a little bit smaller but still has a very pale surface. There is periwound callus accumulation. 03/30/2022: He had another small wound opened up on his abdomen. He says that he thinks these are starting as blisters. He has been on Mounjaro for weight loss and it looks like the loose skin resulting from losing weight might be rubbing and causing small friction injuries. The wound that I treated with chemical cauterization last visit is much smaller; the new wound has a very similar appearance to this. His heel ulcer is smaller with just a little bit of slough buildup on the surface. He continues to have fairly impressive periwound callus formation. 04/07/2022: Both abdominal wounds are smaller today and there is less hypertrophic granulation tissue protruding. The wound on his heel also continues to contract. There is just a layer of slough on the surface. No concern for infection. 04/29/2022: The previous 2 abdominal wounds have closed but he has a new 1 that appears identical to the previous lesions. The wound on his heel is smaller but has a thick layer of callus overlying it. 05/13/2022: His heel wound has healed. The wound on his abdomen remains small but still has some hypertrophic  granulation tissue present. He says it drains serous fluid from time to time. READMISSION 08/18/2022: Mr. Orosco returns with reopening of the small wound on his abdomen. He said it closed and then he recently went swimming and later that day, he noted some serosanguineous drainage on his shirt. He has not had any fevers or chills. The drainage has never been purulent or foul-smelling. As per his previous admission, the wound is small and circular with fat exposed. No concern for infection. 12/14; the patient's original wound that he came in with has healed and is epithelialized  however just below this he has another open area. He noticed this 3 days ago with a significant amount of sanguinous drainage. No pain no obvious purulence. 09/07/2022: The wound is shallower this week. No purulent drainage identified. The culture that was taken grew out just a small amount of Staph aureus pansensitive except to ciprofloxacin. No antibiotic was prescribed by our clinic, but apparently the patient's primary care provider prescribed an antibiotic, but he does not recall the name and it is not available in the electronic medical record. 09/29/2022: Apparently the patient's home health providers have not been packing the wound. It now probes to a depth of 2.4 cm. It is clean without any slough or purulent drainage. No concern for infection. 10/24/2022: No real change to the wound. There is still about the same amount of depth. The orifice is narrow and this seems to be complicating appropriate packing of the site. 11/07/2022: The existing wound closed and the previous one reopened. It probes to 2.7 cm. No purulent drainage or malodor. 11/21/2022: The depth of the wound is a little bit less today. The opening to the wound is quite narrow. He has an appointment with [facility] coming up on March 21. No concern for infection. 12/13/2022: He met with [facility] and they confirmed that these were sinus tracts coming from his Lap-Band port. They want to confirm that his band does not need to be removed in addition to the port before proceeding with surgical intervention. He is scheduled for an upper GI to evaluate this on Thursday. They are also waiting to upload the CT scan that he had performed in [place] so that they can evaluate the site of concern via imaging, as well. No significant changes to the wound itself. Electronic Signature(s) Signed: 12/13/2022 8:13:36 AM By: Fredirick Maudlin MD FACS Previous Signature: 12/13/2022 8:13:17 AM Version By: Fredirick Maudlin MD  FACS Entered By: Fredirick Maudlin on 12/13/2022 08:13:36 -------------------------------------------------------------------------------- Physical Exam Details Patient Name: Date of Service: [name], Skylan 12/13/2022 7:30 A M Medical Record Number: [id number] Patient Account Number: [id number] Date of Birth/Sex: Treating RN: 02-Oct-1955 (67 y.o. M) Primary Care Provider: [name] Other Clinician: Referring Provider: Treating Provider/Extender: [name] in Treatment: 16 Constitutional . . . . no acute distress. Respiratory Normal work of breathing on room air. Notes 12/13/2022: No real change to the wound. It probes down to a hard object, presumably the port. No purulent drainage or malodor. Electronic Signature(s) Signed: 12/13/2022 8:14:25 AM By: Fredirick Maudlin MD FACS Entered By: Fredirick Maudlin on 12/13/2022 08:14:24 Besson, [name] ([id number]) [id number] Page 3 of 9 -------------------------------------------------------------------------------- Physician Orders Details Patient Name: Date of Service: [name], [name] 12/13/2022 7:30 A M Medical Record Number: [id number] Patient Account Number: [id number] Date of Birth/Sex: Treating RN: 14-Mar-1956 (67 y.o. [name] Primary Care Provider: [name] Other Clinician: Referring Provider: Treating Provider/Extender: [name]  in Treatment: 16 Verbal / Phone Orders: No Diagnosis Coding ICD-10 Coding Code Description L98.499 Non-pressure chronic ulcer of skin of other sites with unspecified severity E66.2 Morbid (severe) obesity with alveolar hypoventilation I73.9 Peripheral vascular disease, unspecified 99991111 Chronic diastolic (congestive) heart failure E11.622 Type 2 diabetes mellitus with other skin ulcer Follow-up Appointments ppointment in 2 weeks. - Dr. Celine Ahr Rm 2 Return A Bathing/ Shower/ Hygiene May shower and  wash wound with soap and water. - Change dressing every other day Home Health No change in wound care orders this week; continue Home Health for wound care. May utilize formulary equivalent dressing for wound treatment orders unless otherwise specified. Dressing changes to be completed by [place] on Monday / Wednesday / Friday except when patient has scheduled visit at [facility]. Other Home Health Orders/Instructions: - [facility] Wound Treatment Wound #25 - Abdomen - midline Wound Laterality: Superior Cleanser: Soap and Water Discharge Instructions: May shower and wash wound with dial antibacterial soap and water prior to dressing change. Cleanser: Wound Cleanser Discharge Instructions: Cleanse the wound with wound cleanser prior to applying a clean dressing using gauze sponges, not tissue or cotton balls. Prim Dressing: Iodoform packing strip 1/4 (in) ary Discharge Instructions: Lightly pack as instructed Secondary Dressing: Zetuvit Plus Silicone Border Dressing 4x4 (in/in) Discharge Instructions: Apply silicone border over primary dressing as directed. Electronic Signature(s) Signed: 12/13/2022 8:14:34 AM By: Fredirick Maudlin MD FACS Entered By: Fredirick Maudlin on 12/13/2022 08:14:33 -------------------------------------------------------------------------------- Problem List Details Patient Name: Date of Service: [name], Javarian 12/13/2022 7:30 A M Medical Record Number: [id number] Patient Account Number: [id number] Date of Birth/Sex: Treating RN: [birth year] (67 y.o. [name] Primary Care Provider: [name] Other Clinician: Referring Provider: Treating Provider/Extender: [name] in Treatment: 16 Active Problems ICD-10 Encounter Code Description Active Date MDM Diagnosis L98.499 Non-pressure chronic ulcer of skin of other sites with unspecified severity 08/18/2022 No Yes [name], [place] ([id number])  [id number] Page 4 of 9 E66.2 Morbid (severe) obesity with alveolar hypoventilation 08/18/2022 No Yes I73.9 Peripheral vascular disease, unspecified 08/18/2022 No Yes [id number] Chronic diastolic (congestive) heart failure 08/18/2022 No Yes E11.622 Type 2 diabetes mellitus with other skin ulcer 08/18/2022 No Yes Inactive Problems Resolved Problems Electronic Signature(s) Signed: 12/13/2022 8:11:24 AM By: Fredirick Maudlin MD FACS Entered By: Fredirick Maudlin on 12/13/2022 08:11:24 -------------------------------------------------------------------------------- Progress Note Details Patient Name: Date of Service: [name], [name] 12/13/2022 7:30 A M Medical Record Number: [id number] Patient Account Number: [id number] Date of Birth/Sex: Treating RN: November 13, 1955 (67 y.o. M) Primary Care Provider: [name] Other Clinician: Referring Provider: Treating Provider/Extender: [name] in Treatment: 16 Subjective Chief Complaint Information obtained from Patient 11/09/2018; patient is here for review of wounds on his dorsal right first and second toes and on the dorsal left first toe. 02/22/2022: The patient is here for a new ulcer on his posterior left heel. 08/18/2022: here with new wound on abdomen, similar to prior History of Present Illness (HPI) READMISSION 11/09/2018 This is a now 67 year old man that we had in this clinic over a multitude of years but most recently in 2015. He had wounds on his plantar foot initially in 2007 and 2008 and I think subsequently was seen in 2012 then 2014 into the mid part of 2015 with wounds on his toes. He is a type II diabetic with peripheral neuropathy but no known PAD. The patient states he was doing well until about a month ago he noted an area on the left  great toe which was superficial and then an area on the right great and second toes about a week later. He is not sure how this happened he simply  noticed this when he was lying in bed at night. He has been using peroxide. He has not seen a doctor. He has not been on antibiotics and has had no x-rays. Past medical history; type 2 diabetes with peripheral neuropathy, hypertension, hypothyroidism, low B12 levels, iron deficiency anemia, ventral hernia, diabetic nephropathy, chronic diastolic heart failure and obstructive sleep apnea. ABIs in this clinic were 1.2 on the right and 1.1 on the left 2/28; patient arrived last week for areas on his left first right first and right second toes. X-ray of the right foot did not show any osseous abnormality. Culture of the right great toe showed Staphylococcus aureus methicillin sensitive and he is on dicloxacillin which he started 3 days ago. 3/6; patient with wounds on his right first and second toes and left first toes. The area over the left first toe is extensive we have been using silver alginate. Culture grew MSSA and he is on dicloxacillin which I prescribed on the 25th. He is still taking this which I am not really sure of the reason. He expressed knowledge that this was 4 times a day he should have been out of this around March 2 if he picked it up on the 25th or they gave him the right number of pills. Nevertheless our nurses report more purulent drainage and odor 3/17-Patient returns for the 3 wounds the left first toe and the right first and second toe dorsal surfaces. The left first toe is closed up and healed, the right first toe dorsal surface wound is extensive and appears the same as last time, no purulent drainage or odor reported per nurse, left second toe wound is slightly smaller. He has completed the dicloxacillin as of today. Noted that his culture previously had grown MSSA. We have been using calcium alginate to the wounds. X-rays have been reviewed. ABIs were reviewed as well. 3/27; patient returns for review of wounds on his right first and right second toes. The wound on the  left first toe is healed. 4/10; 2-week follow-up. The area on the right second toe dorsally is healed. Per the patient the left first toe remains healed. He still has an extensive area dorsally over the right first toe inner phalangeal joint area. Rolled up hyper granulated tissue with probably a nonviable surface 4/24; 2-week follow-up. The areas on the right second toe dorsally and right first toe dorsally is healed. The area on the dorsal left first toe over the inner phalangeal joint is smaller but still hyper granulated we have been using Hydrofera Blue 5/15; 3-week follow-up. The areas on the right first toe. Not the left as I said on 4/24. He is using Hydrofera Blue. [name], [name] ([id number]) [id number] Page 5 of 9 5/29; 3-week follow-up. The area on his dorsal right toe over the right inter phalangeal joint is just about closed. We have been using Hydrofera Blue. In passing he showed me his right thumb which is exquisitely tender over the tip of the digit. 6/5; the area on the dorsal right toe over the inner phalangeal joint is closed. Right thumb is better which I gave him antibiotics for last week. Still some tenderness but considerably improved READMISSION 02/22/2022 This is a 67 year old type II diabetic with congestive heart failure, end-stage renal disease on hemodialysis, peripheral artery disease, and morbid obesity.  He has developed an ulcer on his left posterior heel that he believes is secondary to his health assistance not adequately moisturizing his feet. He says it first appeared about 2 weeks ago. He does not have sensation in his feet. He has not noticed any foul odor or drainage. His last hemoglobin A1c was 7.9 at the beginning of January. ABI in clinic today was 1.15. 03/09/2022: He has a new wound on his abdomen that he says started out as a lump and then the skin broke open when he bumped it on a portion of his hospital bed at home. There is  fat exposed. The wound on his heel is a little bit smaller but still has a very pale surface. There is periwound callus accumulation. 03/30/2022: He had another small wound opened up on his abdomen. He says that he thinks these are starting as blisters. He has been on Mounjaro for weight loss and it looks like the loose skin resulting from losing weight might be rubbing and causing small friction injuries. The wound that I treated with chemical cauterization last visit is much smaller; the new wound has a very similar appearance to this. His heel ulcer is smaller with just a little bit of slough buildup on the surface. He continues to have fairly impressive periwound callus formation. 04/07/2022: Both abdominal wounds are smaller today and there is less hypertrophic granulation tissue protruding. The wound on his heel also continues to contract. There is just a layer of slough on the surface. No concern for infection. 04/29/2022: The previous 2 abdominal wounds have closed but he has a new 1 that appears identical to the previous lesions. The wound on his heel is smaller but has a thick layer of callus overlying it. 05/13/2022: His heel wound has healed. The wound on his abdomen remains small but still has some hypertrophic granulation tissue present. He says it drains serous fluid from time to time. READMISSION 08/18/2022: Mr. Kellas returns with reopening of the small wound on his abdomen. He said it closed and then he recently went swimming and later that day, he noted some serosanguineous drainage on his shirt. He has not had any fevers or chills. The drainage has never been purulent or foul-smelling. As per his previous admission, the wound is small and circular with fat exposed. No concern for infection. 12/14; the patient's original wound that he came in with has healed and is epithelialized however just below this he has another open area. He noticed this 3 days ago with a significant amount of  sanguinous drainage. No pain no obvious purulence. 09/07/2022: The wound is shallower this week. No purulent drainage identified. The culture that was taken grew out just a small amount of Staph aureus pansensitive except to ciprofloxacin. No antibiotic was prescribed by our clinic, but apparently the patient's primary care provider prescribed an antibiotic, but he does not recall the name and it is not available in the electronic medical record. 09/29/2022: Apparently the patient's home health providers have not been packing the wound. It now probes to a depth of 2.4 cm. It is clean without any slough or purulent drainage. No concern for infection. 10/24/2022: No real change to the wound. There is still about the same amount of depth. The orifice is narrow and this seems to be complicating appropriate packing of the site. 11/07/2022: The existing wound closed and the previous one reopened. It probes to 2.7 cm. No purulent drainage or malodor. 11/21/2022: The depth of the wound  is a little bit less today. The opening to the wound is quite narrow. He has an appointment with [facility] coming up on March 21. No concern for infection. 12/13/2022: He met with [facility] and they confirmed that these were sinus tracts coming from his Lap-Band port. They want to confirm that his band does not need to be removed in addition to the port before proceeding with surgical intervention. He is scheduled for an upper GI to evaluate this on Thursday. They are also waiting to upload the CT scan that he had performed in [place] so that they can evaluate the site of concern via imaging, as well. No significant changes to the wound itself. Patient History Information obtained from Patient. Family History Cancer - Mother,Father, Diabetes - Mother,Father,Siblings, Heart Disease - Siblings, Lung Disease - Mother, No family history of Hereditary Spherocytosis, Hypertension, Kidney Disease, Seizures,  Stroke, Thyroid Problems, Tuberculosis. Social History Never smoker, Marital Status - Single, Alcohol Use - Never, Drug Use - No History, Caffeine Use - Daily - Coffee. Medical History Eyes Denies history of Cataracts, Glaucoma, Optic Neuritis Ear/Nose/Mouth/Throat Denies history of Chronic sinus problems/congestion, Middle ear problems Hematologic/Lymphatic Denies history of Anemia, Hemophilia, Human Immunodeficiency Virus, Lymphedema, Sickle Cell Disease Respiratory Patient has history of Sleep Apnea Denies history of Aspiration, Asthma, Chronic Obstructive Pulmonary Disease (COPD), Pneumothorax, Tuberculosis Cardiovascular Patient has history of Congestive Heart Failure, Hypertension Denies history of Angina, Arrhythmia, Coronary Artery Disease, Deep Vein Thrombosis, Hypotension, Myocardial Infarction, Peripheral Arterial Disease, Peripheral Venous Disease, Phlebitis, Vasculitis Gastrointestinal Denies history of Cirrhosis , Colitis, Crohnoos, Hepatitis A, Hepatitis B, Hepatitis C Endocrine Patient has history of Type II Diabetes Denies history of Type I Diabetes Genitourinary Denies history of End Stage Renal Disease Immunological Denies history of Lupus Erythematosus, Raynaudoos, Scleroderma [name], [name] ([id number]) [id number] Page 6 of 9 Integumentary (Skin) Denies history of History of Burn Musculoskeletal Patient has history of Osteoarthritis Denies history of Gout, Rheumatoid Arthritis, Osteomyelitis Neurologic Patient has history of Neuropathy - Diabetic Denies history of Dementia, Quadriplegia, Paraplegia, Seizure Disorder Oncologic Denies history of Received Chemotherapy, Received Radiation Psychiatric Denies history of Anorexia/bulimia, Confinement Anxiety Objective Constitutional no acute distress. Vitals Time Taken: 7:42 AM, Height: 72 in, Weight: 400 lbs, BMI: 54.2, Temperature: 97.9 F, Pulse: 65 bpm, Respiratory Rate: 20  breaths/min, Blood Pressure: 113/69 mmHg, Capillary Blood Glucose: 117 mg/dl. General Notes: glucose per pt report yesterday Respiratory Normal work of breathing on room air. General Notes: 12/13/2022: No real change to the wound. It probes down to a hard object, presumably the port. No purulent drainage or malodor. Integumentary (Hair, Skin) Wound #25 status is Open. Original cause of wound was Gradually Appeared. The date acquired was: 11/07/2022. The wound has been in treatment 5 weeks. The wound is located on the Superior Abdomen - midline. The wound measures 0.2cm length x 0.2cm width x 4.5cm depth; 0.031cm^2 area and 0.141cm^3 volume. There is Fat Layer (Subcutaneous Tissue) exposed. There is no tunneling or undermining noted. There is a small amount of serosanguineous drainage noted. The wound margin is distinct with the outline attached to the wound base. There is large (67-100%) red granulation within the wound bed. There is no necrotic tissue within the wound bed. The periwound skin appearance had no abnormalities noted for texture. The periwound skin appearance had no abnormalities noted for moisture. The periwound skin appearance had no abnormalities noted for color. Periwound temperature was noted as No Abnormality. 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 - Dr. Celine Ahr Rm 2 Bathing/ Shower/ Hygiene: May shower and wash wound with soap and water. - Change dressing every other day Home Health: No change in wound care orders this week; continue [facility] for wound care. May utilize formulary equivalent dressing for wound treatment orders unless otherwise  specified. Dressing changes to be completed by [place] on Monday / Wednesday / Friday except when patient has scheduled [facility] at [facility]. Other Home Health Orders/Instructions: - [facility] WOUND #25: - Abdomen - midline Wound Laterality: Superior Cleanser: Soap and Water Discharge Instructions: May shower and wash wound with dial antibacterial soap and water prior to dressing change. Cleanser: Wound Cleanser Discharge Instructions: Cleanse the wound with wound cleanser prior to applying a clean dressing using gauze sponges, not tissue or cotton balls. Prim Dressing: Iodoform packing strip 1/4 (in) ary Discharge Instructions: Lightly pack as instructed Secondary Dressing: Zetuvit Plus Silicone Border Dressing 4x4 (in/in) Discharge Instructions: Apply silicone border over primary dressing as directed. [name], [name] ([id number]) [id number] Page 7 of 9 12/13/2022: No significant change to the wound. No debridement was necessary today. We will continue to pack the wound with quarter inch iodoform packing strips. The wound will likely persist until he is able to have the Lap-Band port removed. [facility] surgery is managing this process. We will continue to help him with his wound while he is awaiting the rest of his workup and an OR date. Follow-up in 2 weeks. Electronic Signature(s) Signed: 12/13/2022 8:15:40 AM By: Fredirick Maudlin MD FACS Entered By: Fredirick Maudlin on 12/13/2022 08:15:39 -------------------------------------------------------------------------------- HxROS Details Patient Name: Date of Service: [name], [name] 12/13/2022 7:30 A M Medical Record Number: [id number] Patient Account Number: [id number] Date of Birth/Sex: Treating RN: [birth year] (67 y.o. M) Primary Care Provider: [name] Other Clinician: Referring Provider: Treating Provider/Extender: [name] in Treatment: 16 Information Obtained  From Patient Eyes Medical History: Negative for: Cataracts; Glaucoma; Optic Neuritis Ear/Nose/Mouth/Throat Medical History: Negative for: Chronic sinus problems/congestion; Middle ear problems Hematologic/Lymphatic Medical History: Negative for: Anemia; Hemophilia; Human Immunodeficiency Virus; Lymphedema; Sickle Cell Disease Respiratory Medical History: Positive for: Sleep Apnea Negative for: Aspiration; Asthma; Chronic Obstructive Pulmonary Disease (COPD); Pneumothorax; Tuberculosis Cardiovascular Medical History: Positive for: Congestive Heart Failure; Hypertension Negative for: Angina; Arrhythmia; Coronary Artery Disease; Deep Vein Thrombosis; Hypotension; Myocardial Infarction; Peripheral Arterial Disease; Peripheral Venous Disease; Phlebitis; Vasculitis Gastrointestinal Medical History: Negative for: Cirrhosis ; Colitis; Crohns; Hepatitis A; Hepatitis B; Hepatitis C Endocrine Medical History: Positive for: Type II Diabetes Negative for: Type I Diabetes Time with diabetes: 1994 Treated with: Insulin Blood sugar tested every day: No Genitourinary Medical History: Negative for: End Stage Renal Disease Immunological [name], [name] ([id number]) [id number] Page 8 of 9 Medical History: Negative for: Lupus Erythematosus; Raynauds; Scleroderma Integumentary (Skin) Medical History: Negative for: History of Burn Musculoskeletal Medical History: Positive for: Osteoarthritis Negative for: Gout; Rheumatoid Arthritis; Osteomyelitis Neurologic Medical History: Positive for: Neuropathy - Diabetic Negative for: Dementia; Quadriplegia; Paraplegia; Seizure Disorder Oncologic Medical History: Negative for: Received Chemotherapy; Received Radiation Psychiatric Medical History: Negative for: Anorexia/bulimia; Confinement Anxiety Immunizations Pneumococcal Vaccine: Received Pneumococcal Vaccination: No Implantable Devices No devices added Family  and Social History Cancer: Yes - Mother,Father; Diabetes: Yes - Mother,Father,Siblings; Heart Disease: Yes - Siblings; Hereditary Spherocytosis: No; Hypertension: No; Kidney  Disease: No; Lung Disease: Yes - Mother; Seizures: No; Stroke: No; Thyroid Problems: No; Tuberculosis: No; Never smoker; Marital Status - Single; Alcohol Use: Never; Drug Use: No History; Caffeine Use: Daily - Coffee; Financial Concerns: No; Food, Clothing or Shelter Needs: No; Support System Lacking: No; Transportation Concerns: No Electronic Signature(s) Signed: 12/13/2022 8:26:35 AM By: Fredirick Maudlin MD FACS Entered By: Fredirick Maudlin on 12/13/2022 08:13:42 -------------------------------------------------------------------------------- SuperBill Details Patient Name: Date of Service: [name], [name] 12/13/2022 Medical Record Number: [id number] Patient Account Number: [id number] Date of Birth/Sex: Treating RN: 08-01-1956 (67 y.o. [name] Primary Care Provider: [name] Other Clinician: Referring Provider: Treating Provider/Extender: [name] in Treatment: 16 Diagnosis Coding ICD-10 Codes Code Description L98.499 Non-pressure chronic ulcer of skin of other sites with unspecified severity E66.2 Morbid (severe) obesity with alveolar hypoventilation I73.9 Peripheral vascular disease, unspecified [id number] Chronic diastolic (congestive) heart failure E11.622 Type 2 diabetes mellitus with other skin ulcer Facility Procedures : JEPHTE, PUND Code: [id number] [facility] ([id number]) Description: 99213 - WOUND CARE VISIT-LEV 3 EST PT [id number] Modifier: [id number] Page 9 of 9 Physician Procedures : CPT4 Code Description Modifier [id number] - WC PHYS LEVEL 4 - EST PT ICD-10 Diagnosis Description L98.499 Non-pressure chronic ulcer of skin of other sites with unspecified severity E66.2 Morbid (severe) obesity with alveolar hypoventilation I50.32   Chronic diastolic (congestive) heart failure E11.622 Type 2 diabetes mellitus with other skin ulcer Quantity: 1 Electronic Signature(s) Signed: 12/13/2022 8:15:52 AM By: Fredirick Maudlin MD FACS Entered By: Fredirick Maudlin on 12/13/2022 08:15:52

## 2022-12-14 NOTE — Progress Notes (Signed)
[name], [place] ([id number]) [id number] Page 1 of 8 Visit Report for 12/13/2022 Arrival Information Details Patient Name: Date of Service: BAYRO, TORELL 12/13/2022 7:30 A M Medical Record Number: [id number] Patient Account Number: [id number] Date of Birth/Sex: Treating RN: 05/11/1956 (67 y.o. Ernestene Mention Primary Care Zamaya Rapaport: [name] Other Clinician: Referring Fortune Brannigan: Treating Trejon Duford/Extender: [name] in Treatment: 16 Visit Information History Since Last Visit Added or deleted any medications: No Patient Arrived: Wheel Chair Any new allergies or adverse reactions: No Arrival Time: 07:37 Had a fall or experienced change in No Accompanied By: self activities of daily living that may affect Transfer Assistance: None risk of falls: Signs or symptoms of abuse/neglect since last visito No Hospitalized since last visit: No Implantable device outside of the clinic excluding No cellular tissue based products placed in the center since last visit: Has Dressing in Place as Prescribed: Yes Pain Present Now: No Notes stays in wheelchair Electronic Signature(s) Signed: 12/13/2022 5:11:32 PM By: Baruch Gouty RN, BSN Entered By: Baruch Gouty on 12/13/2022 07:42:00 -------------------------------------------------------------------------------- Clinic Level of Care Assessment Details Patient Name: Date of Service: SHANTI, BRIAR 12/13/2022 7:30 A M Medical Record Number: [id number] Patient Account Number: [id number] Date of Birth/Sex: Treating RN: 1956-09-02 (67 y.o. Ernestene Mention Primary Care Yareli Carthen: [name] Other Clinician: Referring Channah Godeaux: Treating Reianna Batdorf/Extender: [name] in Treatment: 16 Clinic Level of Care Assessment Items TOOL 4 Quantity Score []  - 0 Use when only an EandM is performed on FOLLOW-UP visit ASSESSMENTS - Nursing Assessment /  Reassessment X- 1 10 Reassessment of Co-morbidities (includes updates in patient status) X- 1 5 Reassessment of Adherence to Treatment Plan ASSESSMENTS - Wound and Skin A ssessment / Reassessment X - Simple Wound Assessment / Reassessment - one wound 1 5 []  - 0 Complex Wound Assessment / Reassessment - multiple wounds []  - 0 Dermatologic / Skin Assessment (not related to wound area) ASSESSMENTS - Focused Assessment []  - 0 Circumferential Edema Measurements - multi extremities []  - 0 Nutritional Assessment / Counseling / Intervention []  - 0 Lower Extremity Assessment (monofilament, tuning fork, pulses) []  - 0 Peripheral Arterial Disease Assessment (using hand held doppler) ASSESSMENTS - Ostomy and/or Continence Assessment and Care []  - 0 Incontinence Assessment and Management CAPIZZI, Yuji ([id number]) [id number] Page 2 of 8 []  - 0 Ostomy Care Assessment and Management (repouching, etc.) PROCESS - Coordination of Care X - Simple Patient / Family Education for ongoing care 1 15 []  - 0 Complex (extensive) Patient / Family Education for ongoing care X- 1 10 Staff obtains Programmer, systems, Records, T Results / Process Orders est X- 1 10 Staff telephones HHA, Nursing Homes / Clarify orders / etc []  - 0 Routine Transfer to another Facility (non-emergent condition) []  - 0 Routine Hospital Admission (non-emergent condition) []  - 0 New Admissions / Biomedical engineer / Ordering NPWT Apligraf, etc. , []  - 0 Emergency Hospital Admission (emergent condition) X- 1 10 Simple Discharge Coordination []  - 0 Complex (extensive) Discharge Coordination PROCESS - Special Needs []  - 0 Pediatric / Minor Patient Management []  - 0 Isolation Patient Management []  - 0 Hearing / Language / Visual special needs []  - 0 Assessment of Community assistance (transportation, D/C planning, etc.) []  - 0 Additional assistance / Altered mentation []  - 0 Support Surface(s)  Assessment (bed, cushion, seat, etc.) INTERVENTIONS - Wound Cleansing / Measurement X - Simple Wound Cleansing - one wound 1 5 []  - 0 Complex Wound Cleansing - multiple wounds X-  1 5 Wound Imaging (photographs - any number of wounds) []  - 0 Wound Tracing (instead of photographs) X- 1 5 Simple Wound Measurement - one wound []  - 0 Complex Wound Measurement - multiple wounds INTERVENTIONS - Wound Dressings X - Small Wound Dressing one or multiple wounds 1 10 []  - 0 Medium Wound Dressing one or multiple wounds []  - 0 Large Wound Dressing one or multiple wounds []  - 0 Application of Medications - topical []  - 0 Application of Medications - injection INTERVENTIONS - Miscellaneous []  - 0 External ear exam []  - 0 Specimen Collection (cultures, biopsies, blood, body fluids, etc.) []  - 0 Specimen(s) / Culture(s) sent or taken to Lab for analysis []  - 0 Patient Transfer (multiple staff / Civil Service fast streamer / Similar devices) []  - 0 Simple Staple / Suture removal (25 or less) []  - 0 Complex Staple / Suture removal (26 or more) []  - 0 Hypo / Hyperglycemic Management (close monitor of Blood Glucose) []  - 0 Ankle / Brachial Index (ABI) - do not check if billed separately X- 1 5 Vital Signs Has the patient been seen at the hospital within the last three years: Yes Total Score: 95 Level Of Care: New/Established - Level 3 Smelcer, Brevyn ([id number]) [id number] Page 3 of 8 Electronic Signature(s) Signed: 12/13/2022 5:11:32 PM By: Baruch Gouty RN, BSN Entered By: Baruch Gouty on 12/13/2022 08:00:06 -------------------------------------------------------------------------------- Encounter Discharge Information Details Patient Name: Date of Service: [name], [place] 12/13/2022 7:30 A M Medical Record Number: [id number] Patient Account Number: [id number] Date of Birth/Sex: Treating RN: [birth year] (67 y.o. Ernestene Mention Primary Care Binnie Droessler: [name]  Other Clinician: Referring Quintara Bost: Treating Hilton Saephan/Extender: [name] in Treatment: 16 Encounter Discharge Information Items Discharge Condition: Stable Ambulatory Status: Wheelchair Discharge Destination: Home Transportation: Other Accompanied By: self Schedule Follow-up Appointment: Yes Clinical Summary of Care: Patient Declined Notes SCAT Electronic Signature(s) Signed: 12/13/2022 5:11:32 PM By: Baruch Gouty RN, BSN Entered By: Baruch Gouty on 12/13/2022 08:07:37 -------------------------------------------------------------------------------- Lower Extremity Assessment Details Patient Name: Date of Service: DVONTE, STERKEL 12/13/2022 7:30 A M Medical Record Number: [id number] Patient Account Number: [id number] Date of Birth/Sex: Treating RN: May 28, 1956 (67 y.o. Ernestene Mention Primary Care Clarise Chacko: [name] Other Clinician: Referring Azekiel Cremer: Treating Stasia Somero/Extender: [name] in Treatment: 16 Electronic Signature(s) Signed: 12/13/2022 5:11:32 PM By: Baruch Gouty RN, BSN Entered By: Baruch Gouty on 12/13/2022 07:42:59 -------------------------------------------------------------------------------- Multi Wound Chart Details Patient Name: Date of Service: [name], [place] 12/13/2022 7:30 A M Medical Record Number: [id number] Patient Account Number: [id number] Date of Birth/Sex: Treating RN: [birth year] (67 y.o. M) Primary Care Breniyah Romm: [name] Other Clinician: Referring Latrish Mogel: Treating Norah Fick/Extender: [name] in Treatment: 16 Vital Signs Height(in): 72 Capillary Blood Glucose(mg/dl): 117 Weight(lbs): 400 Pulse(bpm): 21 Body Mass Index(BMI): 54.2 Blood Pressure(mmHg): 113/69 Temperature(F): 97.9 Respiratory Rate(breaths/min): 20 [25:Photos:] [N/A:N/A] Superior Abdomen - midline N/A N/A Wound Location: Gradually Appeared N/A N/A Wounding  Event: Atypical N/A N/A Primary Etiology: Sleep Apnea, Congestive Heart N/A N/A Comorbid History: Failure, Hypertension, Type II Diabetes, Osteoarthritis, Neuropathy 11/07/2022 N/A N/A Date Acquired: 5 N/A N/A Weeks of Treatment: Open N/A N/A Wound Status: No N/A N/A Wound Recurrence: 0.2x0.2x4.5 N/A N/A Measurements L x W x D (cm) 0.031 N/A N/A A (cm) : rea 0.141 N/A N/A Volume (cm) : 83.50% N/A N/A % Reduction in Area: 72.30% N/A N/A % Reduction in Volume: Full Thickness Without Exposed N/A N/A Classification: Support Structures Small N/A N/A Exudate Amount:  Serosanguineous N/A N/A Exudate Type: red, brown N/A N/A Exudate Color: Distinct, outline attached N/A N/A Wound Margin: Large (67-100%) N/A N/A Granulation Amount: Red N/A N/A Granulation Quality: None Present (0%) N/A N/A Necrotic Amount: Fat Layer (Subcutaneous Tissue): Yes N/A N/A Exposed Structures: Fascia: No Tendon: No Muscle: No Joint: No Bone: No None N/A N/A Epithelialization: No Abnormalities Noted N/A N/A Periwound Skin Texture: No Abnormalities Noted N/A N/A Periwound Skin Moisture: No Abnormalities Noted N/A N/A Periwound Skin Color: No Abnormality N/A N/A Temperature: Treatment Notes Wound #25 (Abdomen - midline) Wound Laterality: Superior Cleanser Soap and Water Discharge Instruction: May shower and wash wound with dial antibacterial soap and water prior to dressing change. Wound Cleanser Discharge Instruction: Cleanse the wound with wound cleanser prior to applying a clean dressing using gauze sponges, not tissue or cotton balls. Peri-Wound Care Topical Primary Dressing Iodoform packing strip 1/4 (in) Discharge Instruction: Lightly pack as instructed Secondary Dressing Zetuvit Plus Silicone Border Dressing 4x4 (in/in) Discharge Instruction: Apply silicone border over primary dressing as directed. Secured With Compression Wrap Compression  Stockings Environmental education officer) Signed: 12/13/2022 8:11:30 AM By: Fredirick Maudlin MD FACS Entered By: Fredirick Maudlin on 12/13/2022 08:11:29 Oklee, [place] ([id number]) [id number] Page 5 of 8 -------------------------------------------------------------------------------- Multi-Disciplinary Care Plan Details Patient Name: Date of Service: AZELL, WEGLEITNER 12/13/2022 7:30 A M Medical Record Number: [id number] Patient Account Number: [id number] Date of Birth/Sex: Treating RN: May 04, 1956 (67 y.o. Ernestene Mention Primary Care Dalon Reichart: [name] Other Clinician: Referring Masiyah Engen: Treating Haylin Camilli/Extender: [name] in Treatment: 16 Active Inactive Abuse / Safety / Falls / Self Care Management Nursing Diagnoses: Impaired physical mobility Potential for falls Goals: Patient/caregiver will verbalize/demonstrate measure taken to improve self care Date Initiated: 10/24/2022 Target Resolution Date: 12/23/2022 Goal Status: Active Patient/caregiver will verbalize/demonstrate measures taken to improve the patient's personal safety Date Initiated: 10/24/2022 Target Resolution Date: 12/23/2022 Goal Status: Active Interventions: Provide education on fall prevention Notes: Wound/Skin Impairment Nursing Diagnoses: Knowledge deficit related to smoking impact on wound healing Goals: Patient/caregiver will verbalize understanding of skin care regimen Date Initiated: 12/13/2022 Target Resolution Date: 12/26/2022 Goal Status: Active Ulcer/skin breakdown will have a volume reduction of 30% by week 4 Date Initiated: 08/18/2022 Date Inactivated: 12/13/2022 Target Resolution Date: 10/21/2022 Goal Status: Met Interventions: Assess patient/caregiver ability to obtain necessary supplies Assess patient/caregiver ability to perform ulcer/skin care regimen upon admission and as needed Assess ulceration(s) every visit Provide education on  ulcer and skin care Treatment Activities: Skin care regimen initiated : 08/18/2022 Notes: Electronic Signature(s) Signed: 12/13/2022 5:11:32 PM By: Baruch Gouty RN, BSN Entered By: Baruch Gouty on 12/13/2022 07:51:19 -------------------------------------------------------------------------------- Pain Assessment Details Patient Name: Date of Service: [name], [place] 12/13/2022 7:30 A M Medical Record Number: [id number] Patient Account Number: [id number] Date of Birth/Sex: Treating RN: 1956-07-11 (67 y.o. Ernestene Mention Primary Care Aidden Markovic: [name] Other Clinician: Referring Toron Bowring: Treating Tanay Massiah/Extender: [name] in Treatment: 16 Active Problems Location of Pain Severity and Description of Pain HIJAZI, Rashawd ([id number]) [id number] Page 6 of 8 Patient Has Paino No Site Locations Rate the pain. Current Pain Level: 0 Pain Management and Medication Current Pain Management: Electronic Signature(s) Signed: 12/13/2022 5:11:32 PM By: Baruch Gouty RN, BSN Entered By: Baruch Gouty on 12/13/2022 07:42:52 -------------------------------------------------------------------------------- Patient/Caregiver Education Details Patient Name: Date of Service: [name], [place] 3/26/2024andnbsp7:30 [place] Record Number: [id number] Patient Account Number: [id number] Date of Birth/Gender: Treating RN: 12-Jun-1956 (67 y.o. Ernestene Mention Primary Care Physician: [name]  Other Clinician: Referring Physician: Treating Physician/Extender: [name] in Treatment: 16 Education Assessment Education Provided To: Patient Education Topics Provided Wound/Skin Impairment: Methods: Explain/Verbal Responses: Reinforcements needed, State content correctly Electronic Signature(s) Signed: 12/13/2022 5:11:32 PM By: Baruch Gouty RN, BSN Entered By: Baruch Gouty on 12/13/2022  07:51:38 -------------------------------------------------------------------------------- Wound Assessment Details Patient Name: Date of Service: [name], [place] 12/13/2022 7:30 A M Medical Record Number: [id number] Patient Account Number: [id number] Date of Birth/Sex: Treating RN: 1955-10-05 (67 y.o. Ernestene Mention Primary Care Destina Mantei: [name] Other Clinician: Referring Marsh Heckler: Treating Aireona Torelli/Extender: [name] in Treatment: 16 Wound Status Wound Number: 25 Primary Atypical Etiology: Wound Location: Superior Abdomen - midline Wound Open Wounding Event: Gradually Appeared Status: Date Acquired: 11/07/2022 [name] ([id number]) [id number] Page 7 of 8 Date Acquired: 11/07/2022 Comorbid Sleep Apnea, Congestive Heart Failure, Hypertension, Type II Weeks Of Treatment: 5 History: Diabetes, Osteoarthritis, Neuropathy Clustered Wound: No Photos Wound Measurements Length: (cm) 0.2 Width: (cm) 0.2 Depth: (cm) 4.5 Area: (cm) 0.031 Volume: (cm) 0.141 % Reduction in Area: 83.5% % Reduction in Volume: 72.3% Epithelialization: None Tunneling: No Undermining: No Wound Description Classification: Full Thickness Without Exposed Support Structures Wound Margin: Distinct, outline attached Exudate Amount: Small Exudate Type: Serosanguineous Exudate Color: red, brown Foul Odor After Cleansing: No Slough/Fibrino Yes Wound Bed Granulation Amount: Large (67-100%) Exposed Structure Granulation Quality: Red Fascia Exposed: No Necrotic Amount: None Present (0%) Fat Layer (Subcutaneous Tissue) Exposed: Yes Tendon Exposed: No Muscle Exposed: No Joint Exposed: No Bone Exposed: No Periwound Skin Texture Texture Color No Abnormalities Noted: Yes No Abnormalities Noted: Yes Moisture Temperature / Pain No Abnormalities Noted: Yes Temperature: No Abnormality Treatment Notes Wound #25 (Abdomen - midline) Wound  Laterality: Superior Cleanser Soap and Water Discharge Instruction: May shower and wash wound with dial antibacterial soap and water prior to dressing change. Wound Cleanser Discharge Instruction: Cleanse the wound with wound cleanser prior to applying a clean dressing using gauze sponges, not tissue or cotton balls. Peri-Wound Care Topical Primary Dressing Iodoform packing strip 1/4 (in) Discharge Instruction: Lightly pack as instructed Secondary Dressing Zetuvit Plus Silicone Border Dressing 4x4 (in/in) Discharge Instruction: Apply silicone border over primary dressing as directed. Secured With [employer] Compression Stockings [place], [place] ([id number]) [id number].pdf Page 8 of 8 Add-Ons Electronic Signature(s) Signed: 12/13/2022 5:11:32 PM By: Baruch Gouty RN, BSN Entered By: Baruch Gouty on 12/13/2022 08:11:28 -------------------------------------------------------------------------------- Vitals Details Patient Name: Date of Service: [name], [place] 12/13/2022 7:30 A M Medical Record Number: [id number] Patient Account Number: [id number] Date of Birth/Sex: Treating RN: [birth year] (67 y.o. Ernestene Mention Primary Care Morrill Bomkamp: [name] Other Clinician: Referring Carmichael Burdette: Treating Kaegan Hettich/Extender: [name] in Treatment: 16 Vital Signs Time Taken: 07:42 Temperature (F): 97.9 Height (in): 72 Pulse (bpm): 65 Weight (lbs): 400 Respiratory Rate (breaths/min): 20 Body Mass Index (BMI): 54.2 Blood Pressure (mmHg): 113/69 Capillary Blood Glucose (mg/dl): 117 Reference Range: 80 - 120 mg / dl Notes glucose per pt report yesterday Electronic Signature(s) Signed: 12/13/2022 5:11:32 PM By: Baruch Gouty RN, BSN Entered By: Baruch Gouty on 12/13/2022 07:42:45

## 2022-12-16 ENCOUNTER — Encounter (HOSPITAL_BASED_OUTPATIENT_CLINIC_OR_DEPARTMENT_OTHER): Payer: Self-pay | Admitting: Cardiovascular Disease

## 2022-12-16 NOTE — Procedures (Signed)
Patient Name: [name], [name] Date: 12/04/2022 Gender: Male D.O.B: Feb 20, 1956 Age (years): 36 Referring Provider: Fransico Him MD, ABSM Height (inches): 73 Interpreting Physician: Shelva Majestic MD, ABSM Weight (lbs): 395 RPSGT: Heug[place], [place] BMI: 52 MRN: [id number] Neck Size: 18.75  CLINICAL INFORMATION The patient is referred for a BiPAP titration to treat sleep apnea.  Date of WatchPat HST: 10/14/2022: OSA AHI 36.7/h; CSA pAHIc 9.7/h; O2 nadir 74%.  SLEEP STUDY TECHNIQUE As per the AASM Manual for the Scoring of Sleep and Associated Events v2.3 (April 2016) with a hypopnea requiring 4% desaturations.  The channels recorded and monitored were frontal, central and occipital EEG, electrooculogram (EOG), submentalis EMG (chin), nasal and oral airflow, thoracic and abdominal wall motion, anterior tibialis EMG, snore microphone, electrocardiogram, and pulse oximetry. Bilevel positive airway pressure (BPAP) was initiated at the beginning of the study and titrated to treat sleep-disordered breathing.  MEDICATIONS Albuterol Sulfate (PROAIR RESPICLICK) 123XX123 (90 Base) MCG/ACT AEPB allopurinol (ZYLOPRIM) 100 MG tablet aspirin EC 81 MG table atorvastatin (LIPITOR) 40 MG tablet bisoprolol (ZEBETA) 10 MG tablet blood glucose meter kit and supplies KIT blood glucose meter kit and supplies Blood Glucose Monitoring Suppl (ACCU-CHEK AVIVA CONNECT) w/Device KIT clobetasol ointment (TEMOVATE) 0.05 % colchicine 0.6 MG tablet Continuous Blood Gluc Sensor (FREESTYLE LIBRE 2 SENSOR) MISC empagliflozin (JARDIANCE) 10 MG TABS tablet ferrous sulfate 325 (65 FE) MG tablet furosemide (LASIX) 20 MG tablet gentamicin ointment (GARAMYCIN) 0.1 % glucose blood (ACCU-CHEK AVIVA PLUS) test strip HYDROcodone-acetaminophen (NORCO) 10-325 MG tablet hydrocortisone 2.5 % cream insulin detemir (LEVEMIR) 100 UNIT/ML injection Insulin Pen Needle (ULTICARE SHORT PEN NEEDLES) 31G X 8 MM  MISC levothyroxine (SYNTHROID) 75 MCG tablet losartan (COZAAR) 25 MG tablet metolazone (ZAROXOLYN) 2.5 MG tablet Multiple Vitamins-Minerals (MULTIVITAMIN ADULT PO) nystatin (MYCOSTATIN/NYSTOP) powder potassium chloride SA (KLOR-CON M20) 20 MEQ tablet Medications self-administered by patient taken the night of the study : N/A  RESPIRATORY PARAMETERS Optimal IPAP Pressure (cm): 16 AHI at Optimal Pressure (/hr) 2.7 Optimal EPAP Pressure (cm): 9   Overall Minimal O2 (%): 72.0 Minimal O2 at Optimal Pressure (%): 86.0  SLEEP ARCHITECTURE Start Time: 11:15:34 PM Stop Time: 5:16:46 AM Total Time (min): 361.2 Total Sleep Time (min): 274.9 Sleep Latency (min): 0.3 Sleep Efficiency (%): 76.1% REM Latency (min): 134.5 WASO (min): 86.0 Stage N1 (%): 9.6% Stage N2 (%): 63.6% Stage N3 (%): 15.3% Stage R (%): 11.5 Supine (%): 100.00 Arousal Index (/hr): 21.8   CARDIAC DATA The 2 lead EKG demonstrated sinus rhythm. The mean heart rate was 61.7 beats per minute. Other EKG findings include: PVCs.  LEG MOVEMENT DATA The total Periodic Limb Movements of Sleep (PLMS) were 0. The PLMS index was 0.0. A PLMS index of <15 is considered normal in adults.  IMPRESSIONS - CPAP was initiated and titrated to 10 cm, transitioned to BiPAP 10/6 and titrated to 16/9 cm of water. Due to persistent hypoxia BiPAP was titrated using obesity hypoventilation settings (ti max 1.5; ti min 0.3; trigger and cylcle medium). Supplemental O2 was administered from 1 L to a maximum of 4 L.  At 16/12 cm AHI was 2.7, O2 nadir at 86%. - Central sleep apnea was not noted during this titration (CAI 0.9/h). - Severe oxygen desaturations were observed during this titration to a nadir of 72%. - The patient snored with moderate snoring volume. - 2-lead EKG demonstrated: PVCs - Clinically significant periodic limb movements were not noted during this study. Arousals associated with PLMs were rare.  DIAGNOSIS -  Obstructive Sleep Apnea  (G47.33)  RECOMMENDATIONS - Recommend an initial trial of BiPAP Auto therapy with EPAP min at 9, PS of 7 and IPAP max at 22 cm H2O with supplemental oxygen at 3L and heated humidification. A Small-Medium size Fisher[employer]Paykel Full Face Evora Full mask was used for the titration. - Effort should be made to optimize nasal and oropahryngeal patency. - Avoid alcohol, sedatives and other CNS depressants that may worsen sleep apnea and disrupt normal sleep architecture. - Sleep hygiene should be reviewed to assess factors that may improve sleep quality. - Weight management and regular exercise should be initiated or continued. - Recommend a download and sleep clinic evaluation after 4 weeks of therapy  [Electronically signed] 12/16/2022 09:09 AM  Shelva Majestic MD, [facility], ABSM Diplomate, American Board of Sleep Medicine  NPI: [id number]  [place] PH: [phone]   FX: [phone] [place]

## 2022-12-27 ENCOUNTER — Telehealth: Payer: Self-pay | Admitting: *Deleted

## 2022-12-27 ENCOUNTER — Encounter (HOSPITAL_BASED_OUTPATIENT_CLINIC_OR_DEPARTMENT_OTHER): Payer: 59 | Attending: General Surgery | Admitting: General Surgery

## 2022-12-27 DIAGNOSIS — Z992 Dependence on renal dialysis: Secondary | ICD-10-CM | POA: Diagnosis not present

## 2022-12-27 DIAGNOSIS — E1122 Type 2 diabetes mellitus with diabetic chronic kidney disease: Secondary | ICD-10-CM | POA: Insufficient documentation

## 2022-12-27 DIAGNOSIS — Z6841 Body Mass Index (BMI) 40.0 and over, adult: Secondary | ICD-10-CM | POA: Diagnosis not present

## 2022-12-27 DIAGNOSIS — E11622 Type 2 diabetes mellitus with other skin ulcer: Secondary | ICD-10-CM | POA: Diagnosis not present

## 2022-12-27 DIAGNOSIS — I132 Hypertensive heart and chronic kidney disease with heart failure and with stage 5 chronic kidney disease, or end stage renal disease: Secondary | ICD-10-CM | POA: Insufficient documentation

## 2022-12-27 DIAGNOSIS — I5032 Chronic diastolic (congestive) heart failure: Secondary | ICD-10-CM | POA: Diagnosis not present

## 2022-12-27 DIAGNOSIS — E1151 Type 2 diabetes mellitus with diabetic peripheral angiopathy without gangrene: Secondary | ICD-10-CM | POA: Diagnosis not present

## 2022-12-27 DIAGNOSIS — N186 End stage renal disease: Secondary | ICD-10-CM | POA: Insufficient documentation

## 2022-12-27 DIAGNOSIS — E662 Morbid (severe) obesity with alveolar hypoventilation: Secondary | ICD-10-CM | POA: Insufficient documentation

## 2022-12-27 DIAGNOSIS — E1142 Type 2 diabetes mellitus with diabetic polyneuropathy: Secondary | ICD-10-CM | POA: Diagnosis not present

## 2022-12-27 DIAGNOSIS — L98499 Non-pressure chronic ulcer of skin of other sites with unspecified severity: Secondary | ICD-10-CM | POA: Diagnosis present

## 2022-12-27 NOTE — Progress Notes (Signed)
[name], [name] ([id number]) [id number] Page 1 of 8 Visit Report for 12/27/2022 Arrival Information Details Patient Name: Date of Service: [name], [name] 12/27/2022 7:45 A M Medical Record Number: [id number] Patient Account Number: [id number] Date of Birth/Sex: Treating RN: April 05, 1956 (67 y.o. [name] PalauM) Herrington, Taylor Primary Care [name]: [name] GarretBrown, Beverly Other Clinician: Referring Cassadie Pankonin: Treating Kyjuan Gause/Extender: Leeroy Bockannon, Jennifer Brown, Beverly Weeks in Treatment: 18 Visit Information History Since Last Visit Added or deleted any medications: No Patient Arrived: Wheel Chair Any new allergies or adverse reactions: No Arrival Time: 07:37 Had a fall or experienced change in No Accompanied By: self activities of daily living that may affect Transfer Assistance: None risk of falls: Patient Identification Verified: Yes Signs or symptoms of abuse/neglect since last visito No Secondary Verification Process Completed: Yes Hospitalized since last visit: No Implantable device outside of the clinic excluding No cellular tissue based products placed in the center since last visit: Has Dressing in Place as Prescribed: Yes Pain Present Now: No Electronic Signature(s) Signed: 12/27/2022 3:43:44 PM By: Samuella BruinHerrington, Taylor Entered By: Samuella BruinHerrington, Taylor on 12/27/2022 07:37:34 -------------------------------------------------------------------------------- Clinic Level of Care Assessment Details Patient Name: Date of Service: [name], Milad 12/27/2022 7:45 A M Medical Record Number: [id number] Patient Account Number: [id number] Date of Birth/Sex: Treating RN: April 05, 1956 (67 y.o. [name] PalauM) Herrington, Taylor Primary Care Evellyn Tuff: [name] GarretBrown, Beverly Other Clinician: Referring Kiarah Eckstein: Treating Tinsley Lomas/Extender: Leeroy Bockannon, Jennifer Brown, Beverly Weeks in Treatment: 18 Clinic Level of Care Assessment Items TOOL 4 Quantity Score X- 1 0 Use when only an EandM is performed on FOLLOW-UP  visit ASSESSMENTS - Nursing Assessment / Reassessment X- 1 10 Reassessment of Co-morbidities (includes updates in patient status) X- 1 5 Reassessment of Adherence to Treatment Plan ASSESSMENTS - Wound and Skin A ssessment / Reassessment X - Simple Wound Assessment / Reassessment - one wound 1 5 []  - 0 Complex Wound Assessment / Reassessment - multiple wounds []  - 0 Dermatologic / Skin Assessment (not related to wound area) ASSESSMENTS - Focused Assessment []  - 0 Circumferential Edema Measurements - multi extremities []  - 0 Nutritional Assessment / Counseling / Intervention []  - 0 Lower Extremity Assessment (monofilament, tuning fork, pulses) []  - 0 Peripheral Arterial Disease Assessment (using hand held doppler) ASSESSMENTS - Ostomy and/or Continence Assessment and Care []  - 0 Incontinence Assessment and Management []  - 0 Ostomy Care Assessment and Management (repouching, etc.) PROCESS - Coordination of Care X - Simple Patient / Family Education for ongoing care 1 15 LaneSWINTON, [name] ([id number]) [id number] Page 2 of 8 []  - 0 Complex (extensive) Patient / Family Education for ongoing care X- 1 10 Staff obtains Consents, Records, T Results / Process Orders est []  - 0 Staff telephones HHA, Nursing Homes / Clarify orders / etc []  - 0 Routine Transfer to another Facility (non-emergent condition) []  - 0 Routine Hospital Admission (non-emergent condition) []  - 0 New Admissions / Manufacturing engine[employer]surance Authorizations / Ordering NPWT Apligraf, etc. , []  - 0 Emergency Hospital Admission (emergent condition) X- 1 10 Simple Discharge Coordination []  - 0 Complex (extensive) Discharge Coordination PROCESS - Special Needs []  - 0 Pediatric / Minor Patient Management []  - 0 Isolation Patient Management []  - 0 Hearing / Language / Visual special needs []  - 0 Assessment of Community assistance (transportation, D/C planning, etc.) []  - 0 Additional assistance /  Altered mentation []  - 0 Support Surface(s) Assessment (bed, cushion, seat, etc.) INTERVENTIONS - Wound Cleansing / Measurement X - Simple Wound Cleansing - one wound 1 5 []  - 0 Complex Wound Cleansing -  multiple wounds X- 1 5 Wound Imaging (photographs - any number of wounds)  - 0 Wound Tracing (instead of photographs) X- 1 5 Simple Wound Measurement - one wound  - 0 Complex Wound Measurement - multiple wounds INTERVENTIONS - Wound Dressings X - Small Wound Dressing one or multiple wounds 1 10  - 0 Medium Wound Dressing one or multiple wounds  - 0 Large Wound Dressing one or multiple wounds  - 0 Application of Medications - topical  - 0 Application of Medications - injection INTERVENTIONS - Miscellaneous  - 0 External ear exam  - 0 Specimen Collection (cultures, biopsies, blood, body fluids, etc.)  - 0 Specimen(s) / Culture(s) sent or taken to Lab for analysis  - 0 Patient Transfer (multiple staff / Nurse, adult / Similar devices)  - 0 Simple Staple / Suture removal (25 or less)  - 0 Complex Staple / Suture removal (26 or more)  - 0 Hypo / Hyperglycemic Management (close monitor of Blood Glucose)  - 0 Ankle / Brachial Index (ABI) - do not check if billed separately X- 1 5 Vital Signs Has the patient been seen at the hospital within the last three years: Yes Total Score: 85 Level Of Care: New/Established - Level 3 Electronic Signature(s) Signed: 12/27/2022 3:43:44 PM By: Samuella Bruin Entered By: Samuella Bruin on 12/27/2022 08:07:55 [name], [name] ([id number]) [id number] Page 3 of 8 -------------------------------------------------------------------------------- Encounter Discharge Information Details Patient Name: Date of Service: REGINO, FOURNET 12/27/2022 7:45 A M Medical Record Number: [id number] Patient Account Number: [id number] Date of Birth/Sex: Treating RN: [birth year] (67 y.o. [name] Primary Care Satin Boal: [name] Other Clinician: Referring Srihan Brutus: Treating Merland Holness/Extender: Leeroy Bock in Treatment: 18 Encounter Discharge Information Items Discharge Condition: Stable Ambulatory Status: Wheelchair Discharge Destination: Home Transportation: Private Auto Accompanied By: self Schedule Follow-up Appointment: Yes Clinical Summary of Care: Patient Declined Electronic Signature(s) Signed: 12/27/2022 3:43:44 PM By: Gelene Mink By: Samuella Bruin on 12/27/2022 07:50:31 -------------------------------------------------------------------------------- Lower Extremity Assessment Details Patient Name: Date of Service: THEOTIS, GERDEMAN 12/27/2022 7:45 A M Medical Record Number: [id number] Patient Account Number: [id number] Date of Birth/Sex: Treating RN: [birth year] (67 y.o. [name] Primary Care Margalit Leece: [name] Other Clinician: Referring Mignonne Afonso: Treating Johnica Armwood/Extender: Kathleen Lime Weeks in Treatment: 18 Electronic Signature(s) Signed: 12/27/2022 3:43:44 PM By: Gelene Mink By: Samuella Bruin on 12/27/2022 07:37:45 -------------------------------------------------------------------------------- Multi Wound Chart Details Patient Name: Date of Service: [name], [name] 12/27/2022 7:45 A M Medical Record Number: [id number] Patient Account Number: [id number] Date of Birth/Sex: Treating RN: 01-19-1956 (67 y.o. M) Primary Care Elverna Caffee: [name] Other Clinician: Referring Niraj Kudrna: Treating Preslie Depasquale/Extender: Leeroy Bock in Treatment: 18 Vital Signs Height(in): 72 Pulse(bpm): 67 Weight(lbs): 400 Blood Pressure(mmHg): 120/55 Body Mass Index(BMI): 54.2 Temperature(F): 98.3 Respiratory Rate(breaths/min): 18 [25:Photos:] [[name]/A:[name]/A] MARTAVION, COUPER ([id number]) [25:Superior Abdomen - midline Wound Location: Gradually Appeared  Wounding Event: Atypical Primary Etiology: Sleep Apnea, Congestive Heart Comorbid History: Failure, Hypertension, Type II Diabetes, Osteoarthritis, Neuropathy 11/07/2022 Date Acquired: 7  Weeks of Treatment: Open Wound Status: No Wound Recurrence: 0.2x0.2x3.7 Measurements L x W x D (cm) 0.031 A (cm) : rea 0.116 Volume (cm) : 83.50% % Reduction in Area: 77.20% % Reduction in Volume: Full Thickness Without Exposed Classification: Support  Structures Medium Exudate Amount: Serosanguineous Exudate Type: red, brown Exudate Color: Distinct, outline attached Wound Margin: Large (67-100%) Granulation Amount: Red Granulation Quality: None Present (0%) Necrotic Amount: Fat Layer (Subcutaneous  Tissue): Yes [name]/A Exposed Structures:  Fascia: No Tendon: No Muscle: No Joint: No Bone: No Small (1-33%) Epithelialization: No Abnormalities Noted Periwound Skin Texture: No Abnormalities Noted Periwound Skin Moisture: No Abnormalities Noted Periwound Skin  Color: No Abnormality Temperature:] [[name]/A:[name]/A [name]/A [name]/A [name]/A [name]/A [name]/A [name]/A [name]/A [name]/A [name]/A [name]/A [name]/A [name]/A [name]/A [name]/A [name]/A [name]/A [name]/A [name]/A [name]/A [name]/A [name]/A [name]/A [name]/A [name]/A [name]/A] Treatment Notes Wound #25 (Abdomen - midline) Wound Laterality: Superior Cleanser Soap and Water Discharge Instruction: May shower and wash wound with dial antibacterial soap and water prior to dressing change. Wound Cleanser Discharge Instruction: Cleanse the wound with wound cleanser prior to applying a clean dressing using gauze sponges, not tissue or cotton balls. Peri-Wound Care Topical Primary Dressing Iodoform packing strip 1/4 (in) Discharge Instruction: Lightly pack as instructed Secondary Dressing Zetuvit Plus Silicone Border Dressing 4x4 (in/in) Discharge Instruction: Apply silicone border over primary dressing as directed. Secured With Compression Wrap Compression Stockings Facilities manager) Signed: 12/27/2022 7:55:16 AM By: Duanne Guess MD FACS Entered By: Duanne Guess on 12/27/2022  07:55:16 -------------------------------------------------------------------------------- Multi-Disciplinary Care Plan Details Patient Name: Date of Service: [name], [name] 12/27/2022 7:45 A M Medical Record Number: [id number] Patient Account Number: [id number] Date of Birth/Sex: Treating RN: Mar 31, 1956 (67 y.o. [name] Primary Care Izetta Sakamoto: [name] Other Clinician: Janora Norlander ([id number]) [id number] Page 5 of 8 Referring Zayneb Baucum: Treating Mirabella Hilario/Extender: Leeroy Bock in Treatment: 18 Active Inactive Abuse / Safety / Falls / Self Care Management Nursing Diagnoses: Impaired physical mobility Potential for falls Goals: Patient/caregiver will verbalize/demonstrate measure taken to improve self care Date Initiated: 10/24/2022 Target Resolution Date: 02/17/2023 Goal Status: Active Patient/caregiver will verbalize/demonstrate measures taken to improve the patient's personal safety Date Initiated: 10/24/2022 Target Resolution Date: 02/17/2023 Goal Status: Active Interventions: Provide education on fall prevention Notes: Electronic Signature(s) Signed: 12/27/2022 3:43:44 PM By: Samuella Bruin Entered By: Samuella Bruin on 12/27/2022 07:49:56 -------------------------------------------------------------------------------- Pain Assessment Details Patient Name: Date of Service: [name], [name] 12/27/2022 7:45 A M Medical Record Number: [id number] Patient Account Number: [id number] Date of Birth/Sex: Treating RN: [birth year] (67 y.o. [name] Primary Care Jessi Pitstick: [name] Other Clinician: Referring Aiana Nordquist: Treating Rondey Fallen/Extender: Leeroy Bock in Treatment: 18 Active Problems Location of Pain Severity and Description of Pain Patient Has Paino No Site Locations Rate the pain. Current Pain Level: 0 Pain Management and Medication Current Pain  Management: Electronic Signature(s) Signed: 12/27/2022 3:43:44 PM By: Samuella Bruin Entered By: Samuella Bruin on 12/27/2022 07:37:42 Kolenovic, Shamarion ([id number]) [id number] Page 6 of 8 -------------------------------------------------------------------------------- Patient/Caregiver Education Details Patient Name: Date of Service: [name], [name] 4/9/2024andnbsp7:45 A M Medical Record Number: [id number] Patient Account Number: [id number] Date of Birth/Gender: Treating RN: 1955/11/14 (67 y.o. [name] Primary Care Physician: [name] Other Clinician: Referring Physician: Treating Physician/Extender: Leeroy Bock in Treatment: 18 Education Assessment Education Provided To: Patient Education Topics Provided Safety: Methods: Explain/Verbal Responses: Reinforcements needed, State content correctly Electronic Signature(s) Signed: 12/27/2022 3:43:44 PM By: Samuella Bruin Entered By: Samuella Bruin on 12/27/2022 07:50:09 -------------------------------------------------------------------------------- Wound Assessment Details Patient Name: Date of Service: [name], [name] 12/27/2022 7:45 A M Medical Record Number: [id number] Patient Account Number: [id number] Date of Birth/Sex: Treating RN: 1956/06/06 (67 y.o. [name] Primary Care Shekera Beavers: [name] Other Clinician: Referring Harlee Eckroth: Treating Axil Copeman/Extender: Leeroy Bock in Treatment: 18 Wound Status Wound Number: 25 Primary Atypical Etiology: Wound Location: Superior Abdomen - midline Wound Open Wounding Event: Gradually Appeared Status: Date Acquired: 11/07/2022 Comorbid Sleep Apnea,  Congestive Heart Failure, Hypertension, Type II Weeks Of Treatment: 7 History: Diabetes, Osteoarthritis, Neuropathy Clustered Wound: No Photos Wound Measurements Length: (cm) 0.2 Width: (cm) 0.2 Depth: (cm)  3.7 Area: (cm) 0.031 Volume: (cm) 0.116 % Reduction in Area: 83.5% % Reduction in Volume: 77.2% Epithelialization: Small (1-33%) Tunneling: No Undermining: No Wound Description Classification: Full Thickness Without Exposed Support Structures Wound Margin: Distinct, outline attached Exudate Amount: Medium [name], [name] ([id number]) Exudate Type: Serosanguineous Exudate Color: red, brown Foul Odor After Cleansing: No Slough/Fibrino Yes [id number].pdf Page 7 of 8 Wound Bed Granulation Amount: Large (67-100%) Exposed Structure Granulation Quality: Red Fascia Exposed: No Necrotic Amount: None Present (0%) Fat Layer (Subcutaneous Tissue) Exposed: Yes Tendon Exposed: No Muscle Exposed: No Joint Exposed: No Bone Exposed: No Periwound Skin Texture Texture Color No Abnormalities Noted: Yes No Abnormalities Noted: Yes Moisture Temperature / Pain No Abnormalities Noted: Yes Temperature: No Abnormality Treatment Notes Wound #25 (Abdomen - midline) Wound Laterality: Superior Cleanser Soap and Water Discharge Instruction: May shower and wash wound with dial antibacterial soap and water prior to dressing change. Wound Cleanser Discharge Instruction: Cleanse the wound with wound cleanser prior to applying a clean dressing using gauze sponges, not tissue or cotton balls. Peri-Wound Care Topical Primary Dressing Iodoform packing strip 1/4 (in) Discharge Instruction: Lightly pack as instructed Secondary Dressing Zetuvit Plus Silicone Border Dressing 4x4 (in/in) Discharge Instruction: Apply silicone border over primary dressing as directed. Secured With Compression Wrap Compression Stockings Facilities manager) Signed: 12/27/2022 3:43:44 PM By: Samuella Bruin Entered By: Samuella Bruin on 12/27/2022 07:46:07 -------------------------------------------------------------------------------- Vitals Details Patient Name: Date of  Service: [name], [name] 12/27/2022 7:45 A M Medical Record Number: [id number] Patient Account Number: [id number] Date of Birth/Sex: Treating RN: 09-May-1956 (67 y.o. [name] Primary Care Shar Paez: [name] Other Clinician: Referring Darren Caldron: Treating Vihaan Gloss/Extender: Leeroy Bock in Treatment: 18 Vital Signs Time Taken: 07:39 Temperature (F): 98.3 Height (in): 72 Pulse (bpm): 67 Weight (lbs): 400 Respiratory Rate (breaths/min): 18 Body Mass Index (BMI): 54.2 Blood Pressure (mmHg): 120/55 Reference Range: 80 - 120 mg / dl Electronic Signature(s) Signed: 12/27/2022 3:43:44 PM By: Artist Beach, Lois ([id number]) PM By: Nonie Hoyer.pdf Page 8 of 8 Signed: 12/27/2022 3:43:44 aylor Entered By: Samuella Bruin on 12/27/2022 07:41:32

## 2022-12-27 NOTE — Progress Notes (Signed)
[name], Beni ([id number]) [id number] Page 1 of 9 Visit Report for 12/27/2022 Chief Complaint Document Details Patient Name: Date of Service: [name], [name] 12/27/2022 7:45 A M Medical Record Number: [id number] Patient Account Number: [id number] Date of Birth/Sex: Treating RN: [birth year] (67 y.o. M) Primary Care Provider: Debria Garret Other Clinician: Referring Provider: Treating Provider/Extender: Leeroy Bock in Treatment: 18 Information Obtained from: Patient Chief Complaint 11/09/2018; patient is here for review of wounds on his dorsal right first and second toes and on the dorsal left first toe. 02/22/2022: The patient is here for a new ulcer on his posterior left heel. 08/18/2022: here with new wound on abdomen, similar to prior Electronic Signature(s) Signed: 12/27/2022 7:55:25 AM By: Duanne Guess MD FACS Entered By: Duanne Guess on 12/27/2022 07:55:25 -------------------------------------------------------------------------------- HPI Details Patient Name: Date of Service: [name], [name] 12/27/2022 7:45 A M Medical Record Number: [id number] Patient Account Number: [id number] Date of Birth/Sex: Treating RN: April 08, 1956 (67 y.o. M) Primary Care Provider: Debria Garret Other Clinician: Referring Provider: Treating Provider/Extender: Leeroy Bock in Treatment: 18 History of Present Illness HPI Description: READMISSION 11/09/2018 This is a now 67 year old man that we had in this clinic over a multitude of years but most recently in 2015. He had wounds on his plantar foot initially in 2007 and 2008 and I think subsequently was seen in 2012 then 2014 into the mid part of 2015 with wounds on his toes. He is a type II diabetic with peripheral neuropathy but no known PAD. The patient states he was doing well until about a month ago he noted an area on the left great toe which was superficial and then an  area on the right great and second toes about a week later. He is not sure how this happened he simply noticed this when he was lying in bed at night. He has been using peroxide. He has not seen a doctor. He has not been on antibiotics and has had no x-rays. Past medical history; type 2 diabetes with peripheral neuropathy, hypertension, hypothyroidism, low B12 levels, iron deficiency anemia, ventral hernia, diabetic nephropathy, chronic diastolic heart failure and obstructive sleep apnea. ABIs in this clinic were 1.2 on the right and 1.1 on the left 2/28; patient arrived last week for areas on his left first right first and right second toes. X-ray of the right foot did not show any osseous abnormality. Culture of the right great toe showed Staphylococcus aureus methicillin sensitive and he is on dicloxacillin which he started 3 days ago. 3/6; patient with wounds on his right first and second toes and left first toes. The area over the left first toe is extensive we have been using silver alginate. Culture grew MSSA and he is on dicloxacillin which I prescribed on the 25th. He is still taking this which I am not really sure of the reason. He expressed knowledge that this was 4 times a day he should have been out of this around March 2 if he picked it up on the 25th or they gave him the right number of pills. Nevertheless our nurses report more purulent drainage and odor 3/17-Patient returns for the 3 wounds the left first toe and the right first and second toe dorsal surfaces. The left first toe is closed up and healed, the right first toe dorsal surface wound is extensive and appears the same as last time, no purulent drainage or odor reported per nurse, left second toe wound is slightly smaller. He  has completed the dicloxacillin as of today. Noted that his culture previously had grown MSSA. We have been using calcium alginate to the wounds. X-rays have been reviewed. ABIs were reviewed as  well. 3/27; patient returns for review of wounds on his right first and right second toes. The wound on the left first toe is healed. 4/10; 2-week follow-up. The area on the right second toe dorsally is healed. Per the patient the left first toe remains healed. He still has an extensive area dorsally over the right first toe inner phalangeal joint area. Rolled up hyper granulated tissue with probably a nonviable surface 4/24; 2-week follow-up. The areas on the right second toe dorsally and right first toe dorsally is healed. The area on the dorsal left first toe over the inner phalangeal joint is smaller but still hyper granulated we have been using Hydrofera Blue 5/15; 3-week follow-up. The areas on the right first toe. Not the left as I said on 4/24. He is using Hydrofera Blue. 5/29; 3-week follow-up. The area on his dorsal right toe over the right inter phalangeal joint is just about closed. We have been using Hydrofera Blue. In passing he showed me his right thumb which is exquisitely tender over the tip of the digit. 6/5; the area on the dorsal right toe over the inner phalangeal joint is closed. Right thumb is better which I gave him antibiotics for last week. Still some tenderness but considerably improved [name], [name] ([id number]) [id number] Page 2 of 9 02/22/2022 This is a 67 year old type II diabetic with congestive heart failure, end-stage renal disease on hemodialysis, peripheral artery disease, and morbid obesity. He has developed an ulcer on his left posterior heel that he believes is secondary to his health assistance not adequately moisturizing his feet. He says it first appeared about 2 weeks ago. He does not have sensation in his feet. He has not noticed any foul odor or drainage. His last hemoglobin A1c was 7.9 at the beginning of January. ABI in clinic today was 1.15. 03/09/2022: He has a new wound on his abdomen that he says started out as  a lump and then the skin broke open when he bumped it on a portion of his hospital bed at home. There is fat exposed. The wound on his heel is a little bit smaller but still has a very pale surface. There is periwound callus accumulation. 03/30/2022: He had another small wound opened up on his abdomen. He says that he thinks these are starting as blisters. He has been on Mounjaro for weight loss and it looks like the loose skin resulting from losing weight might be rubbing and causing small friction injuries. The wound that I treated with chemical cauterization last visit is much smaller; the new wound has a very similar appearance to this. His heel ulcer is smaller with just a little bit of slough buildup on the surface. He continues to have fairly impressive periwound callus formation. 04/07/2022: Both abdominal wounds are smaller today and there is less hypertrophic granulation tissue protruding. The wound on his heel also continues to contract. There is just a layer of slough on the surface. No concern for infection. 04/29/2022: The previous 2 abdominal wounds have closed but he has a new 1 that appears identical to the previous lesions. The wound on his heel is smaller but has a thick layer of callus overlying it. 05/13/2022: His heel wound has healed. The wound on his abdomen remains small but still has some hypertrophic  granulation tissue present. He says it drains serous fluid from time to time. READMISSION 08/18/2022: [name] returns with reopening of the small wound on his abdomen. He said it closed and then he recently went swimming and later that day, he noted some serosanguineous drainage on his shirt. He has not had any fevers or chills. The drainage has never been purulent or foul-smelling. As per his previous admission, the wound is small and circular with fat exposed. No concern for infection. 12/14; the patient's original wound that he came in with has healed and is epithelialized  however just below this he has another open area. He noticed this 3 days ago with a significant amount of sanguinous drainage. No pain no obvious purulence. 09/07/2022: The wound is shallower this week. No purulent drainage identified. The culture that was taken grew out just a small amount of Staph aureus pansensitive except to ciprofloxacin. No antibiotic was prescribed by our clinic, but apparently the patient's primary care provider prescribed an antibiotic, but he does not recall the name and it is not available in the electronic medical record. 09/29/2022: Apparently the patient's home health providers have not been packing the wound. It now probes to a depth of 2.4 cm. It is clean without any slough or purulent drainage. No concern for infection. 10/24/2022: No real change to the wound. There is still about the same amount of depth. The orifice is narrow and this seems to be complicating appropriate packing of the site. 11/07/2022: The existing wound closed and the previous one reopened. It probes to 2.7 cm. No purulent drainage or malodor. 11/21/2022: The depth of the wound is a little bit less today. The opening to the wound is quite narrow. He has an appointment with [facility] coming up on March 21. No concern for infection. 12/13/2022: He met with [facility] and they confirmed that these were sinus tracts coming from his Lap-Band port. They want to confirm that his band does not need to be removed in addition to the port before proceeding with surgical intervention. He is scheduled for an upper GI to evaluate this on Thursday. They are also waiting to upload the CT scan that he had performed in [place] so that they can evaluate the site of concern via imaging, as well. No significant changes to the wound itself. 12/27/2022: The sinus tract is a little bit shallower today, but really no significant changes. He had his upper GI and he is awaiting communication from  the bariatric surgeon. Unfortunately the home health agency was not performing his wound care correctly and he dismissed them. Electronic Signature(s) Signed: 12/27/2022 7:56:31 AM By: Duanne Guess MD FACS Entered By: Duanne Guess on 12/27/2022 07:56:31 -------------------------------------------------------------------------------- Physical Exam Details Patient Name: Date of Service: [name], [name] 12/27/2022 7:45 A M Medical Record Number: [id number] Patient Account Number: [id number] Date of Birth/Sex: Treating RN: 08-22-1956 (68 y.o. M) Primary Care Provider: Debria Garret Other Clinician: Referring Provider: Treating Provider/Extender: Leeroy Bock in Treatment: 18 Constitutional . . . . no acute distress. Respiratory Normal work of breathing on room air. Notes 12/27/2022: No significant changes. Electronic Signature(s) Signed: 12/27/2022 7:57:20 AM By: Duanne Guess MD FACS [name], [name] ([id number]) [id number] Page 3 of 9 Entered By: Duanne Guess on 12/27/2022 07:57:19 -------------------------------------------------------------------------------- Physician Orders Details Patient Name: Date of Service: [name], [name] 12/27/2022 7:45 A M Medical Record Number: [id number] Patient Account Number: [id number] Date of Birth/Sex: Treating RN: 1956/05/25 (67 y.o. [name]  Primary Care Provider: Debria GarretBrown, Beverly Other Clinician: Referring Provider: Treating Provider/Extender: Leeroy Bockannon, Aleysha Meckler Brown, Beverly Weeks in Treatment: [phone] Verbal / Phone Orders: No Diagnosis Coding ICD-10 Coding Code Description L98.499 Non-pressure chronic ulcer of skin of other sites with unspecified severity E66.2 Morbid (severe) obesity with alveolar hypoventilation I73.9 Peripheral vascular disease, unspecified I50.32 Chronic diastolic (congestive) heart failure E11.622 Type 2 diabetes mellitus with other skin  ulcer Follow-up Appointments ppointment in 2 weeks. - Dr. Lady Garyannon Rm 2 Return A Bathing/ Shower/ Hygiene May shower and wash wound with soap and water. - Change dressing every other day Home Health Admit to Home Health for skilled nursing wound care. May utilize formulary equivalent dressing for wound treatment orders unless otherwise specified. New wound care orders this week; continue Home Health for wound care. May utilize formulary equivalent dressing for wound treatment orders unless otherwise specified. Dressing changes to be completed by Home Health on Monday / Wednesday / Friday except [facility] Other Home Health Orders/Instructions: Wound Treatment Wound #25 - Abdomen - midline Wound Laterality: Superior Cleanser: Soap and Water Discharge Instructions: May shower and wash wound with dial antibacterial soap and water prior to dressing change. Cleanser: Wound Cleanser Discharge Instructions: Cleanse the wound with wound cleanser prior to applying a clean dressing using gauze sponges, not tissue or cotton balls. Prim Dressing: Iodoform packing strip 1/4 (in) ary Discharge Instructions: Lightly pack as instructed Secondary Dressing: Zetuvit Plus Silicone Border Dressing 4x4 (in/in) Discharge Instructions: Apply silicone border over primary dressing as directed. Electronic Signature(s) Signed: 12/27/2022 3:09:48 PM By: Duanne Guessannon, Donelle Hise MD FACS Signed: 12/27/2022 3:43:44 PM By: Samuella BruinHerrington, Taylor Previous Signature: 12/27/2022 7:57:30 AM Version By: Duanne Guessannon, Johnpaul Gillentine MD FACS Entered By: Samuella BruinHerrington, Taylor on 12/27/2022 13:51:13 -------------------------------------------------------------------------------- Problem List Details Patient Name: Date of Service: [name] MoorsSWINTO N, Arkin 12/27/2022 7:45 A M Medical Record Number: [id number] Patient Account Number: [id number] Date of Birth/Sex: Treating RN: 1956-08-16 (67 y.o. M) Primary Care Provider: Debria GarretBrown,  Beverly Other Clinician: Referring Provider: Treating Provider/Extender: Leeroy Bockannon, Petina Muraski Brown, Beverly Weeks in Treatment: [address]18 Active Problems [place], Emari ([id number]) [id number] Page 4 of 9 ICD-10 Encounter Code Description Active Date MDM Diagnosis L98.499 Non-pressure chronic ulcer of skin of other sites with unspecified severity 08/18/2022 No Yes E66.2 Morbid (severe) obesity with alveolar hypoventilation 08/18/2022 No Yes I73.9 Peripheral vascular disease, unspecified 08/18/2022 No Yes I50.32 Chronic diastolic (congestive) heart failure 08/18/2022 No Yes E11.622 Type 2 diabetes mellitus with other skin ulcer 08/18/2022 No Yes Inactive Problems Resolved Problems Electronic Signature(s) Signed: 12/27/2022 7:55:09 AM By: Duanne Guessannon, Laquitha Heslin MD FACS Entered By: Duanne Guessannon, Lorell Thibodaux on 12/27/2022 07:55:08 -------------------------------------------------------------------------------- Progress Note Details Patient Name: Date of Service: [name] MoorsSWINTO N, Abhi 12/27/2022 7:45 A M Medical Record Number: [id number] Patient Account Number: [id number] Date of Birth/Sex: Treating RN: 1956-08-16 (67 y.o. M) Primary Care Provider: Debria GarretBrown, Beverly Other Clinician: Referring Provider: Treating Provider/Extender: Leeroy Bockannon, Camellia Popescu Brown, Beverly Weeks in Treatment: 18 Subjective Chief Complaint Information obtained from Patient 11/09/2018; patient is here for review of wounds on his dorsal right first and second toes and on the dorsal left first toe. 02/22/2022: The patient is here for a new ulcer on his posterior left heel. 08/18/2022: here with new wound on abdomen, similar to prior History of Present Illness (HPI) READMISSION 11/09/2018 This is a now 67 year old man that we had in this clinic over a multitude of years but most recently in 2015. He had wounds on his plantar foot initially in 2007 and 2008 and I think subsequently  was seen in 2012 then 2014 into the mid part of  2015 with wounds on his toes. He is a type II diabetic with peripheral neuropathy but no known PAD. The patient states he was doing well until about a month ago he noted an area on the left great toe which was superficial and then an area on the right great and second toes about a week later. He is not sure how this happened he simply noticed this when he was lying in bed at night. He has been using peroxide. He has not seen a doctor. He has not been on antibiotics and has had no x-rays. Past medical history; type 2 diabetes with peripheral neuropathy, hypertension, hypothyroidism, low B12 levels, iron deficiency anemia, ventral hernia, diabetic nephropathy, chronic diastolic heart failure and obstructive sleep apnea. ABIs in this clinic were 1.2 on the right and 1.1 on the left 2/28; patient arrived last week for areas on his left first right first and right second toes. X-ray of the right foot did not show any osseous abnormality. Culture of the right great toe showed Staphylococcus aureus methicillin sensitive and he is on dicloxacillin which he started 3 days ago. 3/6; patient with wounds on his right first and second toes and left first toes. The area over the left first toe is extensive we have been using silver alginate. Culture grew MSSA and he is on dicloxacillin which I prescribed on the 25th. He is still taking this which I am not really sure of the reason. He expressed knowledge that this was 4 times a day he should have been out of this around March 2 if he picked it up on the 25th or they gave him the right number of pills. Nevertheless our nurses report more purulent drainage and odor 3/17-Patient returns for the 3 wounds the left first toe and the right first and second toe dorsal surfaces. The left first toe is closed up and healed, the right first toe dorsal surface wound is extensive and appears the same as last time, no purulent drainage or odor reported per nurse, left second toe  wound is slightly smaller. He has completed the dicloxacillin as of today. Noted that his culture previously had grown MSSA. We have been using calcium alginate to the wounds. X-rays have been reviewed. ABIs were reviewed as well. [name], [name] ([id number]) [id number] Page 5 of 9 3/27; patient returns for review of wounds on his right first and right second toes. The wound on the left first toe is healed. 4/10; 2-week follow-up. The area on the right second toe dorsally is healed. Per the patient the left first toe remains healed. He still has an extensive area dorsally over the right first toe inner phalangeal joint area. Rolled up hyper granulated tissue with probably a nonviable surface 4/24; 2-week follow-up. The areas on the right second toe dorsally and right first toe dorsally is healed. The area on the dorsal left first toe over the inner phalangeal joint is smaller but still hyper granulated we have been using Hydrofera Blue 5/15; 3-week follow-up. The areas on the right first toe. Not the left as I said on 4/24. He is using Hydrofera Blue. 5/29; 3-week follow-up. The area on his dorsal right toe over the right inter phalangeal joint is just about closed. We have been using Hydrofera Blue. In passing he showed me his right thumb which is exquisitely tender over the tip of the digit. 6/5; the area on the dorsal right toe  over the inner phalangeal joint is closed. Right thumb is better which I gave him antibiotics for last week. Still some tenderness but considerably improved READMISSION 02/22/2022 This is a 67 year old type II diabetic with congestive heart failure, end-stage renal disease on hemodialysis, peripheral artery disease, and morbid obesity. He has developed an ulcer on his left posterior heel that he believes is secondary to his health assistance not adequately moisturizing his feet. He says it first appeared about 2 weeks ago. He does not have  sensation in his feet. He has not noticed any foul odor or drainage. His last hemoglobin A1c was 7.9 at the beginning of January. ABI in clinic today was 1.15. 03/09/2022: He has a new wound on his abdomen that he says started out as a lump and then the skin broke open when he bumped it on a portion of his hospital bed at home. There is fat exposed. The wound on his heel is a little bit smaller but still has a very pale surface. There is periwound callus accumulation. 03/30/2022: He had another small wound opened up on his abdomen. He says that he thinks these are starting as blisters. He has been on Mounjaro for weight loss and it looks like the loose skin resulting from losing weight might be rubbing and causing small friction injuries. The wound that I treated with chemical cauterization last visit is much smaller; the new wound has a very similar appearance to this. His heel ulcer is smaller with just a little bit of slough buildup on the surface. He continues to have fairly impressive periwound callus formation. 04/07/2022: Both abdominal wounds are smaller today and there is less hypertrophic granulation tissue protruding. The wound on his heel also continues to contract. There is just a layer of slough on the surface. No concern for infection. 04/29/2022: The previous 2 abdominal wounds have closed but he has a new 1 that appears identical to the previous lesions. The wound on his heel is smaller but has a thick layer of callus overlying it. 05/13/2022: His heel wound has healed. The wound on his abdomen remains small but still has some hypertrophic granulation tissue present. He says it drains serous fluid from time to time. READMISSION 08/18/2022: [name] returns with reopening of the small wound on his abdomen. He said it closed and then he recently went swimming and later that day, he noted some serosanguineous drainage on his shirt. He has not had any fevers or chills. The drainage has  never been purulent or foul-smelling. As per his previous admission, the wound is small and circular with fat exposed. No concern for infection. 12/14; the patient's original wound that he came in with has healed and is epithelialized however just below this he has another open area. He noticed this 3 days ago with a significant amount of sanguinous drainage. No pain no obvious purulence. 09/07/2022: The wound is shallower this week. No purulent drainage identified. The culture that was taken grew out just a small amount of Staph aureus pansensitive except to ciprofloxacin. No antibiotic was prescribed by our clinic, but apparently the patient's primary care provider prescribed an antibiotic, but he does not recall the name and it is not available in the electronic medical record. 09/29/2022: Apparently the patient's home health providers have not been packing the wound. It now probes to a depth of 2.4 cm. It is clean without any slough or purulent drainage. No concern for infection. 10/24/2022: No real change to the wound. There  is still about the same amount of depth. The orifice is narrow and this seems to be complicating appropriate packing of the site. 11/07/2022: The existing wound closed and the previous one reopened. It probes to 2.7 cm. No purulent drainage or malodor. 11/21/2022: The depth of the wound is a little bit less today. The opening to the wound is quite narrow. He has an appointment with [facility] coming up on March 21. No concern for infection. 12/13/2022: He met with [facility] and they confirmed that these were sinus tracts coming from his Lap-Band port. They want to confirm that his band does not need to be removed in addition to the port before proceeding with surgical intervention. He is scheduled for an upper GI to evaluate this on Thursday. They are also waiting to upload the CT scan that he had performed in [place] so that they can evaluate the site  of concern via imaging, as well. No significant changes to the wound itself. 12/27/2022: The sinus tract is a little bit shallower today, but really no significant changes. He had his upper GI and he is awaiting communication from the bariatric surgeon. Unfortunately the home health agency was not performing his wound care correctly and he dismissed them. Patient History Information obtained from Patient. Family History Cancer - Mother,Father, Diabetes - Mother,Father,Siblings, Heart Disease - Siblings, Lung Disease - Mother, No family history of Hereditary Spherocytosis, Hypertension, Kidney Disease, Seizures, Stroke, Thyroid Problems, Tuberculosis. Social History Never smoker, Marital Status - Single, Alcohol Use - Never, Drug Use - No History, Caffeine Use - Daily - Coffee. Medical History Eyes Denies history of Cataracts, Glaucoma, Optic Neuritis Ear/Nose/Mouth/Throat Denies history of Chronic sinus problems/congestion, Middle ear problems Hematologic/Lymphatic Denies history of Anemia, Hemophilia, Human Immunodeficiency Virus, Lymphedema, Sickle Cell Disease Respiratory Patient has history of Sleep Apnea Denies history of Aspiration, Asthma, Chronic Obstructive Pulmonary Disease (COPD), Pneumothorax, Tuberculosis Cardiovascular Patient has history of Congestive Heart Failure, Hypertension Denies history of Angina, Arrhythmia, Coronary Artery Disease, Deep Vein Thrombosis, Hypotension, Myocardial Infarction, Peripheral Arterial Disease, Peripheral Venous Disease, Phlebitis, Vasculitis LINE, [name] ([id number]) [id number] Page 6 of 9 Gastrointestinal Denies history of Cirrhosis , Colitis, Crohnoos, Hepatitis A, Hepatitis B, Hepatitis C Endocrine Patient has history of Type II Diabetes Denies history of Type I Diabetes Genitourinary Denies history of End Stage Renal Disease Immunological Denies history of Lupus Erythematosus, Raynaudoos,  Scleroderma Integumentary (Skin) Denies history of History of Burn Musculoskeletal Patient has history of Osteoarthritis Denies history of Gout, Rheumatoid Arthritis, Osteomyelitis Neurologic Patient has history of Neuropathy - Diabetic Denies history of Dementia, Quadriplegia, Paraplegia, Seizure Disorder Oncologic Denies history of Received Chemotherapy, Received Radiation Psychiatric Denies history of Anorexia/bulimia, Confinement Anxiety Objective Constitutional no acute distress. Vitals Time Taken: 7:39 AM, Height: 72 in, Weight: 400 lbs, BMI: 54.2, Temperature: 98.3 F, Pulse: 67 bpm, Respiratory Rate: 18 breaths/min, Blood Pressure: 120/55 mmHg. Respiratory Normal work of breathing on room air. General Notes: 12/27/2022: No significant changes. Integumentary (Hair, Skin) Wound #25 status is Open. Original cause of wound was Gradually Appeared. The date acquired was: 11/07/2022. The wound has been in treatment 7 weeks. The wound is located on the Superior Abdomen - midline. The wound measures 0.2cm length x 0.2cm width x 3.7cm depth; 0.031cm^2 area and 0.116cm^3 volume. There is Fat Layer (Subcutaneous Tissue) exposed. There is no tunneling or undermining noted. There is a medium amount of serosanguineous drainage noted. The wound margin is distinct with the outline attached to the wound base. There  is large (67-100%) red granulation within the wound bed. There is no necrotic tissue within the wound bed. The periwound skin appearance had no abnormalities noted for texture. The periwound skin appearance had no abnormalities noted for moisture. The periwound skin appearance had no abnormalities noted for color. Periwound temperature was noted as No Abnormality. 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 - Dr. Lady Gary Rm 2 Bathing/ Shower/ Hygiene: May shower and wash wound with soap and water. - Change dressing every other day Home Health: Admit to Home Health for skilled nursing wound care. May utilize formulary equivalent dressing for wound treatment orders unless otherwise specified. New wound care orders this week; continue Home Health for wound care. May utilize formulary equivalent dressing for wound treatment orders unless otherwise specified. Dressing changes to be completed by Home Health on Monday / Wednesday / Friday except when patient has scheduled visit at [facility]. Other Home Health Orders/Instructions: WOUND #25: - Abdomen - midline Wound Laterality: Superior Cleanser: Soap and Water Discharge Instructions: May shower and wash wound with dial antibacterial soap and water prior to dressing change. [name], [name] ([id number]) [id number] Page 7 of 9 Cleanser: Wound Cleanser Discharge Instructions: Cleanse the wound with wound cleanser prior to applying a clean dressing using gauze sponges, not tissue or cotton balls. Prim Dressing: Iodoform packing strip 1/4 (in) ary Discharge Instructions: Lightly pack as instructed Secondary Dressing: Zetuvit Plus Silicone Border Dressing 4x4 (in/in) Discharge Instructions: Apply silicone border over primary dressing as directed. 12/27/2022: No significant changes to the wound. We will continue to pack it with quarter inch iodoform packing strips. We will need to find another home health agency, as the one that was coming out was not actually packing the wound. He is awaiting communication from the bariatric surgeon at [facility] to discuss the radiographic findings and plan surgical intervention. Follow-up here in 2 weeks. Electronic Signature(s) Signed: 12/27/2022 3:09:48 PM By:  Duanne Guess MD FACS Signed: 12/27/2022 3:43:44 PM By: Samuella Bruin Previous Signature: 12/27/2022 7:58:17 AM Version By: Duanne Guess MD FACS Entered By: Samuella Bruin on 12/27/2022 13:51:54 -------------------------------------------------------------------------------- HxROS Details Patient Name: Date of Service: [name], Dellis 12/27/2022 7:45 A M Medical Record Number: [id number] Patient Account Number: [id number] Date of Birth/Sex: Treating RN: December 19, 1955 (67 y.o. M) Primary Care Provider: Debria Garret Other Clinician: Referring Provider: Treating Provider/Extender: Leeroy Bock in Treatment: 18 Information Obtained From Patient Eyes Medical History: Negative for: Cataracts; Glaucoma; Optic Neuritis Ear/Nose/Mouth/Throat Medical History: Negative for: Chronic sinus problems/congestion; Middle ear problems Hematologic/Lymphatic Medical History: Negative for: Anemia; Hemophilia; Human Immunodeficiency Virus; Lymphedema; Sickle Cell Disease Respiratory Medical History: Positive for: Sleep Apnea Negative for: Aspiration; Asthma; Chronic Obstructive Pulmonary Disease (COPD); Pneumothorax; Tuberculosis Cardiovascular Medical History: Positive for: Congestive Heart Failure; Hypertension Negative for: Angina; Arrhythmia; Coronary Artery Disease; Deep Vein Thrombosis; Hypotension; Myocardial Infarction; Peripheral Arterial Disease; Peripheral Venous Disease; Phlebitis; Vasculitis Gastrointestinal Medical History: Negative for: Cirrhosis ; Colitis; Crohns; Hepatitis A; Hepatitis B; Hepatitis C Endocrine Medical History: Positive for: Type II Diabetes Negative for: Type I Diabetes Time with diabetes: 1994 Treated with: Insulin Blood sugar tested every day: No [name], [name] ([id number]) [id number] Page 8 of 9 Medical History: Negative for: End Stage Renal Disease Immunological Medical  History: Negative for: Lupus  Erythematosus; Raynauds; Scleroderma Integumentary (Skin) Medical History: Negative for: History of Burn Musculoskeletal Medical History: Positive for: Osteoarthritis Negative for: Gout; Rheumatoid Arthritis; Osteomyelitis Neurologic Medical History: Positive for: Neuropathy - Diabetic Negative for: Dementia; Quadriplegia; Paraplegia; Seizure Disorder Oncologic Medical History: Negative for: Received Chemotherapy; Received Radiation Psychiatric Medical History: Negative for: Anorexia/bulimia; Confinement Anxiety Immunizations Pneumococcal Vaccine: Received Pneumococcal Vaccination: No Implantable Devices No devices added Family and Social History Cancer: Yes - Mother,Father; Diabetes: Yes - Mother,Father,Siblings; Heart Disease: Yes - Siblings; Hereditary Spherocytosis: No; Hypertension: No; Kidney Disease: No; Lung Disease: Yes - Mother; Seizures: No; Stroke: No; Thyroid Problems: No; Tuberculosis: No; Never smoker; Marital Status - Single; Alcohol Use: Never; Drug Use: No History; Caffeine Use: Daily - Coffee; Financial Concerns: No; Food, Clothing or Shelter Needs: No; Support System Lacking: No; Transportation Concerns: No Electronic Signature(s) Signed: 12/27/2022 12:21:18 PM By: Duanne Guess MD FACS Entered By: Duanne Guess on 12/27/2022 07:56:38 -------------------------------------------------------------------------------- SuperBill Details Patient Name: Date of Service: [name], [name] 12/27/2022 Medical Record Number: [id number] Patient Account Number: [id number] Date of Birth/Sex: Treating RN: 1956/04/26 (68 y.o. M) Primary Care Provider: Debria Garret Other Clinician: Referring Provider: Treating Provider/Extender: Leeroy Bock in Treatment: 18 Diagnosis Coding ICD-10 Codes Code Description L98.499 Non-pressure chronic ulcer of skin of other sites with unspecified severity E66.2 Morbid (severe)  obesity with alveolar hypoventilation I73.9 Peripheral vascular disease, unspecified I50.32 Chronic diastolic (congestive) heart failure ETHERIDGE, GEIL ([id number]) [id number] Page 9 of 9 E11.622 Type 2 diabetes mellitus with other skin ulcer Facility Procedures : CPT4 Code: 13086578 Description: 99213 - WOUND CARE VISIT-LEV 3 EST PT Modifier: Quantity: 1 Physician Procedures : CPT4 Code Description Modifier [id number] 99214 - WC PHYS LEVEL 4 - EST PT ICD-10 Diagnosis Description L98.499 Non-pressure chronic ulcer of skin of other sites with unspecified severity E66.2 Morbid (severe) obesity with alveolar hypoventilation E11.622  Type 2 diabetes mellitus with other skin ulcer I50.32 Chronic diastolic (congestive) heart failure Quantity: 1 Electronic Signature(s) Signed: 12/27/2022 12:21:18 PM By: Duanne Guess MD FACS Signed: 12/27/2022 3:43:44 PM By: Samuella Bruin Previous Signature: 12/27/2022 7:58:31 AM Version By: Duanne Guess MD FACS Entered By: Samuella Bruin on 12/27/2022 08:08:01

## 2022-12-27 NOTE — Telephone Encounter (Signed)
Message left to contact me to discuss sleep study completion.

## 2023-01-10 ENCOUNTER — Encounter (HOSPITAL_BASED_OUTPATIENT_CLINIC_OR_DEPARTMENT_OTHER): Payer: 59 | Admitting: General Surgery

## 2023-01-10 DIAGNOSIS — L98499 Non-pressure chronic ulcer of skin of other sites with unspecified severity: Secondary | ICD-10-CM | POA: Diagnosis not present

## 2023-01-10 NOTE — Progress Notes (Signed)
[name], [name] ([id number]) [id number] Page 1 of 9 Visit Report for 01/10/2023 Chief Complaint Document Details Patient Name: Date of Service: [name], [name] 01/10/2023 10:30 A M Medical Record Number: [id number] Patient Account Number: [id number] Date of Birth/Sex: Treating RN: 10-Mar-1956 (66 y.o. M) Primary Care Provider: Debria Garret Other Clinician: Referring Provider: Treating Provider/Extender: Leeroy Bock in Treatment: 20 Information Obtained from: Patient Chief Complaint 11/09/2018; patient is here for review of wounds on his dorsal right first and second toes and on the dorsal left first toe. 02/22/2022: The patient is here for a new ulcer on his posterior left heel. 08/18/2022: here with new wound on abdomen, similar to prior Electronic Signature(s) Signed: 01/10/2023 10:38:09 AM By: Duanne Guess MD FACS Entered By: Duanne Guess on 01/10/2023 10:38:09 -------------------------------------------------------------------------------- HPI Details Patient Name: Date of Service: [name], [name] 01/10/2023 10:30 A M Medical Record Number: [id number] Patient Account Number: [id number] Date of Birth/Sex: Treating RN: 1956/09/07 (67 y.o. M) Primary Care Provider: Debria Garret Other Clinician: Referring Provider: Treating Provider/Extender: Leeroy Bock in Treatment: 20 History of Present Illness HPI Description: READMISSION 11/09/2018 This is a now 67 year old man that we had in this clinic over a multitude of years but most recently in 2015. He had wounds on his plantar foot initially in 2007 and 2008 and I think subsequently was seen in 2012 then 2014 into the mid part of 2015 with wounds on his toes. He is a type II diabetic with peripheral neuropathy but no known PAD. The patient states he was doing well until about a month ago he noted an area on the left great toe which was superficial and  then an area on the right great and second toes about a week later. He is not sure how this happened he simply noticed this when he was lying in bed at night. He has been using peroxide. He has not seen a doctor. He has not been on antibiotics and has had no x-rays. Past medical history; type 2 diabetes with peripheral neuropathy, hypertension, hypothyroidism, low B12 levels, iron deficiency anemia, ventral hernia, diabetic nephropathy, chronic diastolic heart failure and obstructive sleep apnea. ABIs in this clinic were 1.2 on the right and 1.1 on the left 2/28; patient arrived last week for areas on his left first right first and right second toes. X-ray of the right foot did not show any osseous abnormality. Culture of the right great toe showed Staphylococcus aureus methicillin sensitive and he is on dicloxacillin which he started 3 days ago. 3/6; patient with wounds on his right first and second toes and left first toes. The area over the left first toe is extensive we have been using silver alginate. Culture grew MSSA and he is on dicloxacillin which I prescribed on the 25th. He is still taking this which I am not really sure of the reason. He expressed knowledge that this was 4 times a day he should have been out of this around March 2 if he picked it up on the 25th or they gave him the right number of pills. Nevertheless our nurses report more purulent drainage and odor 3/17-Patient returns for the 3 wounds the left first toe and the right first and second toe dorsal surfaces. The left first toe is closed up and healed, the right first toe dorsal surface wound is extensive and appears the same as last time, no purulent drainage or odor reported per nurse, left second toe wound is slightly smaller. He  has completed the dicloxacillin as of today. Noted that his culture previously had grown MSSA. We have been using calcium alginate to the wounds. X-rays have been reviewed. ABIs were reviewed as  well. 3/27; patient returns for review of wounds on his right first and right second toes. The wound on the left first toe is healed. 4/10; 2-week follow-up. The area on the right second toe dorsally is healed. Per the patient the left first toe remains healed. He still has an extensive area dorsally over the right first toe inner phalangeal joint area. Rolled up hyper granulated tissue with probably a nonviable surface 4/24; 2-week follow-up. The areas on the right second toe dorsally and right first toe dorsally is healed. The area on the dorsal left first toe over the inner phalangeal joint is smaller but still hyper granulated we have been using Hydrofera Blue 5/15; 3-week follow-up. The areas on the right first toe. Not the left as I said on 4/24. He is using Hydrofera Blue. 5/29; 3-week follow-up. The area on his dorsal right toe over the right inter phalangeal joint is just about closed. We have been using Hydrofera Blue. In passing he showed me his right thumb which is exquisitely tender over the tip of the digit. 6/5; the area on the dorsal right toe over the inner phalangeal joint is closed. Right thumb is better which I gave him antibiotics for last week. Still some tenderness but considerably improved [name], Kemond ([id number]) [id number] Page 2 of 9 02/22/2022 This is a 67 year old type II diabetic with congestive heart failure, end-stage renal disease on hemodialysis, peripheral artery disease, and morbid obesity. He has developed an ulcer on his left posterior heel that he believes is secondary to his health assistance not adequately moisturizing his feet. He says it first appeared about 2 weeks ago. He does not have sensation in his feet. He has not noticed any foul odor or drainage. His last hemoglobin A1c was 7.9 at the beginning of January. ABI in clinic today was 1.15. 03/09/2022: He has a new wound on his abdomen that he says started out as  a lump and then the skin broke open when he bumped it on a portion of his hospital bed at home. There is fat exposed. The wound on his heel is a little bit smaller but still has a very pale surface. There is periwound callus accumulation. 03/30/2022: He had another small wound opened up on his abdomen. He says that he thinks these are starting as blisters. He has been on Mounjaro for weight loss and it looks like the loose skin resulting from losing weight might be rubbing and causing small friction injuries. The wound that I treated with chemical cauterization last visit is much smaller; the new wound has a very similar appearance to this. His heel ulcer is smaller with just a little bit of slough buildup on the surface. He continues to have fairly impressive periwound callus formation. 04/07/2022: Both abdominal wounds are smaller today and there is less hypertrophic granulation tissue protruding. The wound on his heel also continues to contract. There is just a layer of slough on the surface. No concern for infection. 04/29/2022: The previous 2 abdominal wounds have closed but he has a new 1 that appears identical to the previous lesions. The wound on his heel is smaller but has a thick layer of callus overlying it. 05/13/2022: His heel wound has healed. The wound on his abdomen remains small but still has some hypertrophic  granulation tissue present. He says it drains serous fluid from time to time. READMISSION 08/18/2022: Mr. Eich returns with reopening of the small wound on his abdomen. He said it closed and then he recently went swimming and later that day, he noted some serosanguineous drainage on his shirt. He has not had any fevers or chills. The drainage has never been purulent or foul-smelling. As per his previous admission, the wound is small and circular with fat exposed. No concern for infection. 12/14; the patient's original wound that he came in with has healed and is epithelialized  however just below this he has another open area. He noticed this 3 days ago with a significant amount of sanguinous drainage. No pain no obvious purulence. 09/07/2022: The wound is shallower this week. No purulent drainage identified. The culture that was taken grew out just a small amount of Staph aureus pansensitive except to ciprofloxacin. No antibiotic was prescribed by our clinic, but apparently the patient's primary care provider prescribed an antibiotic, but he does not recall the name and it is not available in the electronic medical record. 09/29/2022: Apparently the patient's home health providers have not been packing the wound. It now probes to a depth of 2.4 cm. It is clean without any slough or purulent drainage. No concern for infection. 10/24/2022: No real change to the wound. There is still about the same amount of depth. The orifice is narrow and this seems to be complicating appropriate packing of the site. 11/07/2022: The existing wound closed and the previous one reopened. It probes to 2.7 cm. No purulent drainage or malodor. 11/21/2022: The depth of the wound is a little bit less today. The opening to the wound is quite narrow. He has an appointment with [facility] coming up on March 21. No concern for infection. 12/13/2022: He met with [facility] and they confirmed that these were sinus tracts coming from his Lap-Band port. They want to confirm that his band does not need to be removed in addition to the port before proceeding with surgical intervention. He is scheduled for an upper GI to evaluate this on Thursday. They are also waiting to upload the CT scan that he had performed in [place] so that they can evaluate the site of concern via imaging, as well. No significant changes to the wound itself. 12/27/2022: The sinus tract is a little bit shallower today, but really no significant changes. He had his upper GI and he is awaiting communication from  the bariatric surgeon. Unfortunately the home health agency was not performing his wound care correctly and he dismissed them. 01/10/2023: No real change to the wound. I can still feel the reservoir for his Lap-Band when I probe the wound. No purulent drainage. He still has not heard anything from the bariatric surgeon. Electronic Signature(s) Signed: 01/10/2023 10:39:00 AM By: Duanne Guess MD FACS Entered By: Duanne Guess on 01/10/2023 10:39:00 -------------------------------------------------------------------------------- Physical Exam Details Patient Name: Date of Service: [name], [name] 01/10/2023 10:30 A M Medical Record Number: [id number] Patient Account Number: [id number] Date of Birth/Sex: Treating RN: 1956/05/27 (67 y.o. M) Primary Care Provider: Debria Garret Other Clinician: Referring Provider: Treating Provider/Extender: Leeroy Bock in Treatment: 20 Constitutional . . . . no acute distress. Respiratory Normal work of breathing on room air. Notes 01/10/2023: No real change. [name], Mikyle ([id number]) [id number] Page 3 of 9 Electronic Signature(s) Signed: 01/10/2023 10:40:10 AM By: Duanne Guess MD FACS Entered By: Duanne Guess on 01/10/2023 10:40:09 --------------------------------------------------------------------------------  Physician Orders Details Patient Name: Date of Service: ODIES, DESA 01/10/2023 10:30 A M Medical Record Number: [id number] Patient Account Number: [id number] Date of Birth/Sex: Treating RN: 04/18/1956 (67 y.o. [name] Primary Care Provider: Debria Garret Other Clinician: Referring Provider: Treating Provider/Extender: Leeroy Bock in Treatment: 20 Verbal / Phone Orders: No Diagnosis Coding ICD-10 Coding Code Description L98.499 Non-pressure chronic ulcer of skin of other sites with unspecified severity E66.2 Morbid (severe) obesity  with alveolar hypoventilation I73.9 Peripheral vascular disease, unspecified I50.32 Chronic diastolic (congestive) heart failure E11.622 Type 2 diabetes mellitus with other skin ulcer Follow-up Appointments ppointment in 2 weeks. - Dr. Lady Gary Rm 2 Return A Bathing/ Shower/ Hygiene May shower and wash wound with soap and water. - Change dressing every other day Home Health No change in wound care orders this week; continue Home Health for wound care. May utilize formulary equivalent dressing for wound treatment orders unless otherwise specified. Dressing changes to be completed by Home Health on Monday / Wednesday / Friday except when patient has scheduled visit at [facility]. Other Home Health Orders/Instructions: Wound Treatment Wound #25 - Abdomen - midline Wound Laterality: Superior Cleanser: Soap and Water 3 x Per Week/30 Days Discharge Instructions: May shower and wash wound with dial antibacterial soap and water prior to dressing change. Cleanser: Wound Cleanser 3 x Per Week/30 Days Discharge Instructions: Cleanse the wound with wound cleanser prior to applying a clean dressing using gauze sponges, not tissue or cotton balls. Prim Dressing: Iodoform packing strip 1/4 (in) 3 x Per Week/30 Days ary Discharge Instructions: Lightly pack as instructed Secondary Dressing: Zetuvit Plus Silicone Border Dressing 4x4 (in/in) 3 x Per Week/30 Days Discharge Instructions: Apply silicone border over primary dressing as directed. Electronic Signature(s) Signed: 01/10/2023 12:22:35 PM By: Duanne Guess MD FACS Signed: 01/10/2023 4:38:05 PM By: Zenaida Deed RN, BSN Previous Signature: 01/10/2023 10:40:21 AM Version By: Duanne Guess MD FACS Entered By: Zenaida Deed on 01/10/2023 12:08:39 -------------------------------------------------------------------------------- Problem List Details Patient Name: Date of Service: [name], [place] 01/10/2023 10:30 A M Medical Record Number:  [id number] Patient Account Number: [id number] Date of Birth/Sex: Treating RN: Feb 16, 1956 (67 y.o. [name] Primary Care Provider: Debria Garret Other Clinician: Referring Provider: Treating Provider/Extender: Leeroy Bock in Treatment: [address], [place] ([id number]) [id number] Page 4 of 9 Active Problems ICD-10 Encounter Code Description Active Date MDM Diagnosis L98.499 Non-pressure chronic ulcer of skin of other sites with unspecified severity 08/18/2022 No Yes E66.2 Morbid (severe) obesity with alveolar hypoventilation 08/18/2022 No Yes I73.9 Peripheral vascular disease, unspecified 08/18/2022 No Yes I50.32 Chronic diastolic (congestive) heart failure 08/18/2022 No Yes E11.622 Type 2 diabetes mellitus with other skin ulcer 08/18/2022 No Yes Inactive Problems Resolved Problems Electronic Signature(s) Signed: 01/10/2023 10:37:54 AM By: Duanne Guess MD FACS Entered By: Duanne Guess on 01/10/2023 10:37:54 -------------------------------------------------------------------------------- Progress Note Details Patient Name: Date of Service: [name], [name] 01/10/2023 10:30 A M Medical Record Number: [id number] Patient Account Number: [id number] Date of Birth/Sex: Treating RN: [birth year] (67 y.o. M) Primary Care Provider: Debria Garret Other Clinician: Referring Provider: Treating Provider/Extender: Leeroy Bock in Treatment: 20 Subjective Chief Complaint Information obtained from Patient 11/09/2018; patient is here for review of wounds on his dorsal right first and second toes and on the dorsal left first toe. 02/22/2022: The patient is here for a new ulcer on his posterior left heel. 08/18/2022: here with new wound on abdomen, similar to prior History of Present Illness (HPI) READMISSION  11/09/2018 This is a now 67 year old man that we had in this clinic over a multitude of years but most  recently in 2015. He had wounds on his plantar foot initially in 2007 and 2008 and I think subsequently was seen in 2012 then 2014 into the mid part of 2015 with wounds on his toes. He is a type II diabetic with peripheral neuropathy but no known PAD. The patient states he was doing well until about a month ago he noted an area on the left great toe which was superficial and then an area on the right great and second toes about a week later. He is not sure how this happened he simply noticed this when he was lying in bed at night. He has been using peroxide. He has not seen a doctor. He has not been on antibiotics and has had no x-rays. Past medical history; type 2 diabetes with peripheral neuropathy, hypertension, hypothyroidism, low B12 levels, iron deficiency anemia, ventral hernia, diabetic nephropathy, chronic diastolic heart failure and obstructive sleep apnea. ABIs in this clinic were 1.2 on the right and 1.1 on the left 2/28; patient arrived last week for areas on his left first right first and right second toes. X-ray of the right foot did not show any osseous abnormality. Culture of the right great toe showed Staphylococcus aureus methicillin sensitive and he is on dicloxacillin which he started 3 days ago. 3/6; patient with wounds on his right first and second toes and left first toes. The area over the left first toe is extensive we have been using silver alginate. Culture grew MSSA and he is on dicloxacillin which I prescribed on the 25th. He is still taking this which I am not really sure of the reason. He expressed knowledge that this was 4 times a day he should have been out of this around March 2 if he picked it up on the 25th or they gave him the right number of pills. Nevertheless our nurses report more purulent drainage and odor 3/17-Patient returns for the 3 wounds the left first toe and the right first and second toe dorsal surfaces. The left first toe is closed up and healed,  the right first toe dorsal surface wound is extensive and appears the same as last time, no purulent drainage or odor reported per nurse, left second toe wound is slightly smaller. [name], Layne ([id number]) [id number] Page 5 of 9 He has completed the dicloxacillin as of today. Noted that his culture previously had grown MSSA. We have been using calcium alginate to the wounds. X-rays have been reviewed. ABIs were reviewed as well. 3/27; patient returns for review of wounds on his right first and right second toes. The wound on the left first toe is healed. 4/10; 2-week follow-up. The area on the right second toe dorsally is healed. Per the patient the left first toe remains healed. He still has an extensive area dorsally over the right first toe inner phalangeal joint area. Rolled up hyper granulated tissue with probably a nonviable surface 4/24; 2-week follow-up. The areas on the right second toe dorsally and right first toe dorsally is healed. The area on the dorsal left first toe over the inner phalangeal joint is smaller but still hyper granulated we have been using Hydrofera Blue 5/15; 3-week follow-up. The areas on the right first toe. Not the left as I said on 4/24. He is using Hydrofera Blue. 5/29; 3-week follow-up. The area on his dorsal right toe over the right  inter phalangeal joint is just about closed. We have been using Hydrofera Blue. In passing he showed me his right thumb which is exquisitely tender over the tip of the digit. 6/5; the area on the dorsal right toe over the inner phalangeal joint is closed. Right thumb is better which I gave him antibiotics for last week. Still some tenderness but considerably improved READMISSION 02/22/2022 This is a 67 year old type II diabetic with congestive heart failure, end-stage renal disease on hemodialysis, peripheral artery disease, and morbid obesity. He has developed an ulcer on his left posterior heel that he  believes is secondary to his health assistance not adequately moisturizing his feet. He says it first appeared about 2 weeks ago. He does not have sensation in his feet. He has not noticed any foul odor or drainage. His last hemoglobin A1c was 7.9 at the beginning of January. ABI in clinic today was 1.15. 03/09/2022: He has a new wound on his abdomen that he says started out as a lump and then the skin broke open when he bumped it on a portion of his hospital bed at home. There is fat exposed. The wound on his heel is a little bit smaller but still has a very pale surface. There is periwound callus accumulation. 03/30/2022: He had another small wound opened up on his abdomen. He says that he thinks these are starting as blisters. He has been on Mounjaro for weight loss and it looks like the loose skin resulting from losing weight might be rubbing and causing small friction injuries. The wound that I treated with chemical cauterization last visit is much smaller; the new wound has a very similar appearance to this. His heel ulcer is smaller with just a little bit of slough buildup on the surface. He continues to have fairly impressive periwound callus formation. 04/07/2022: Both abdominal wounds are smaller today and there is less hypertrophic granulation tissue protruding. The wound on his heel also continues to contract. There is just a layer of slough on the surface. No concern for infection. 04/29/2022: The previous 2 abdominal wounds have closed but he has a new 1 that appears identical to the previous lesions. The wound on his heel is smaller but has a thick layer of callus overlying it. 05/13/2022: His heel wound has healed. The wound on his abdomen remains small but still has some hypertrophic granulation tissue present. He says it drains serous fluid from time to time. READMISSION 08/18/2022: Mr. Amsden returns with reopening of the small wound on his abdomen. He said it closed and then he  recently went swimming and later that day, he noted some serosanguineous drainage on his shirt. He has not had any fevers or chills. The drainage has never been purulent or foul-smelling. As per his previous admission, the wound is small and circular with fat exposed. No concern for infection. 12/14; the patient's original wound that he came in with has healed and is epithelialized however just below this he has another open area. He noticed this 3 days ago with a significant amount of sanguinous drainage. No pain no obvious purulence. 09/07/2022: The wound is shallower this week. No purulent drainage identified. The culture that was taken grew out just a small amount of Staph aureus pansensitive except to ciprofloxacin. No antibiotic was prescribed by our clinic, but apparently the patient's primary care provider prescribed an antibiotic, but he does not recall the name and it is not available in the electronic medical record. 09/29/2022: Apparently the patient's  home health providers have not been packing the wound. It now probes to a depth of 2.4 cm. It is clean without any slough or purulent drainage. No concern for infection. 10/24/2022: No real change to the wound. There is still about the same amount of depth. The orifice is narrow and this seems to be complicating appropriate packing of the site. 11/07/2022: The existing wound closed and the previous one reopened. It probes to 2.7 cm. No purulent drainage or malodor. 11/21/2022: The depth of the wound is a little bit less today. The opening to the wound is quite narrow. He has an appointment with [facility] coming up on March 21. No concern for infection. 12/13/2022: He met with [facility] and they confirmed that these were sinus tracts coming from his Lap-Band port. They want to confirm that his band does not need to be removed in addition to the port before proceeding with surgical intervention. He is scheduled for an upper  GI to evaluate this on Thursday. They are also waiting to upload the CT scan that he had performed in [place] so that they can evaluate the site of concern via imaging, as well. No significant changes to the wound itself. 12/27/2022: The sinus tract is a little bit shallower today, but really no significant changes. He had his upper GI and he is awaiting communication from the bariatric surgeon. Unfortunately the home health agency was not performing his wound care correctly and he dismissed them. 01/10/2023: No real change to the wound. I can still feel the reservoir for his Lap-Band when I probe the wound. No purulent drainage. He still has not heard anything from the bariatric surgeon. Patient History Information obtained from Patient. Family History Cancer - Mother,Father, Diabetes - Mother,Father,Siblings, Heart Disease - Siblings, Lung Disease - Mother, No family history of Hereditary Spherocytosis, Hypertension, Kidney Disease, Seizures, Stroke, Thyroid Problems, Tuberculosis. Social History Never smoker, Marital Status - Single, Alcohol Use - Never, Drug Use - No History, Caffeine Use - Daily - Coffee. Medical History Eyes Denies history of Cataracts, Glaucoma, Optic Neuritis Ear/Nose/Mouth/Throat Denies history of Chronic sinus problems/congestion, Middle ear problems Hematologic/Lymphatic Denies history of Anemia, Hemophilia, Human Immunodeficiency Virus, Lymphedema, Sickle Cell Disease Respiratory Patient has history of Sleep Apnea GINYARD, [place] ([id number]) [id number] Page 6 of 9 Denies history of Aspiration, Asthma, Chronic Obstructive Pulmonary Disease (COPD), Pneumothorax, Tuberculosis Cardiovascular Patient has history of Congestive Heart Failure, Hypertension Denies history of Angina, Arrhythmia, Coronary Artery Disease, Deep Vein Thrombosis, Hypotension, Myocardial Infarction, Peripheral Arterial Disease, Peripheral Venous Disease, Phlebitis,  Vasculitis Gastrointestinal Denies history of Cirrhosis , Colitis, Crohnoos, Hepatitis A, Hepatitis B, Hepatitis C Endocrine Patient has history of Type II Diabetes Denies history of Type I Diabetes Genitourinary Denies history of End Stage Renal Disease Immunological Denies history of Lupus Erythematosus, Raynaudoos, Scleroderma Integumentary (Skin) Denies history of History of Burn Musculoskeletal Patient has history of Osteoarthritis Denies history of Gout, Rheumatoid Arthritis, Osteomyelitis Neurologic Patient has history of Neuropathy - Diabetic Denies history of Dementia, Quadriplegia, Paraplegia, Seizure Disorder Oncologic Denies history of Received Chemotherapy, Received Radiation Psychiatric Denies history of Anorexia/bulimia, Confinement Anxiety Objective Constitutional no acute distress. Vitals Time Taken: 9:49 AM, Height: 72 in, Weight: 400 lbs, BMI: 54.2, Temperature: 98.2 F, Pulse: 89 bpm, Respiratory Rate: 18 breaths/min, Blood Pressure: 102/67 mmHg. Respiratory Normal work of breathing on room air. General Notes: 01/10/2023: No real change. Integumentary (Hair, Skin) Wound #25 status is Open. Original cause of wound was Gradually Appeared. The date acquired  was: 11/07/2022. The wound has been in treatment 9 weeks. The wound is located on the Superior Abdomen - midline. The wound measures 0.2cm length x 0.2cm width x 4.5cm depth; 0.031cm^2 area and 0.141cm^3 volume. There is Fat Layer (Subcutaneous Tissue) exposed. There is no tunneling or undermining noted. There is a medium amount of serosanguineous drainage noted. The wound margin is distinct with the outline attached to the wound base. There is no granulation within the wound bed. There is no necrotic tissue within the wound bed. The periwound skin appearance had no abnormalities noted for texture. The periwound skin appearance had no abnormalities noted for moisture. The periwound skin appearance had no  abnormalities noted for color. Periwound temperature was noted as No Abnormality. 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 - Dr. Lady Gary Rm 2 Bathing/ Shower/ Hygiene: May shower and wash wound with soap and water. - Change dressing every other day Home Health: No change in wound care orders this week; continue Home Health for wound care. May utilize formulary equivalent dressing for wound treatment orders unless otherwise specified. [name], [place] ([id number]) [id number] Page 7 of 9 Dressing changes to be completed by Home Health on Monday / Wednesday / Friday except when patient has scheduled visit at [facility] Other Home Health Orders/Instructions: WOUND #25: - Abdomen - midline Wound Laterality: Superior Cleanser: Soap and Water 3 x Per Week/30 Days Discharge Instructions: May shower and wash wound with dial antibacterial soap and water prior to dressing change. Cleanser: Wound Cleanser 3 x Per Week/30 Days Discharge Instructions: Cleanse the wound with wound cleanser prior to applying a clean dressing using gauze sponges, not tissue or cotton balls. Prim Dressing: Iodoform packing strip 1/4 (in) 3 x Per Week/30 Days ary Discharge Instructions: Lightly pack as instructed Secondary Dressing: Zetuvit Plus Silicone Border Dressing 4x4 (in/in) 3 x Per Week/30 Days Discharge Instructions: Apply silicone border over primary dressing as directed. 01/10/2023: No real change to the wound. He is still awaiting communication from the bariatric surgeon regarding port removal. We will continue to pack  the wound with quarter inch iodoform packing strips. Follow-up in 2 weeks. Electronic Signature(s) Signed: 01/10/2023 12:22:35 PM By: Duanne Guess MD FACS Signed: 01/10/2023 4:38:05 PM By: Zenaida Deed RN, BSN Previous Signature: 01/10/2023 10:41:03 AM Version By: Duanne Guess MD FACS Entered By: Zenaida Deed on 01/10/2023 12:08:58 -------------------------------------------------------------------------------- HxROS Details Patient Name: Date of Service: [name], [place] 01/10/2023 10:30 A M Medical Record Number: [id number] Patient Account Number: [id number] Date of Birth/Sex: Treating RN: [birth year] (67 y.o. M) Primary Care Provider: Debria Garret Other Clinician: Referring Provider: Treating Provider/Extender: Leeroy Bock in Treatment: 20 Information Obtained From Patient Eyes Medical History: Negative for: Cataracts; Glaucoma; Optic Neuritis Ear/Nose/Mouth/Throat Medical History: Negative for: Chronic sinus problems/congestion; Middle ear problems Hematologic/Lymphatic Medical History: Negative for: Anemia; Hemophilia; Human Immunodeficiency Virus; Lymphedema; Sickle Cell Disease Respiratory Medical History: Positive for: Sleep Apnea Negative for: Aspiration; Asthma; Chronic Obstructive Pulmonary Disease (COPD); Pneumothorax; Tuberculosis Cardiovascular Medical History: Positive for: Congestive Heart Failure; Hypertension Negative for: Angina; Arrhythmia; Coronary Artery Disease; Deep Vein Thrombosis; Hypotension; Myocardial Infarction; Peripheral Arterial Disease; Peripheral Venous Disease; Phlebitis; Vasculitis Gastrointestinal Medical History: Negative for: Cirrhosis ; Colitis; Crohns; Hepatitis A; Hepatitis B; Hepatitis  C Endocrine Medical History: Positive for: Type II Diabetes Negative for: Type I Diabetes Time with diabetes: 1994 Treated with: Insulin [place], [place] ([id number]) [id number] Page 8  of 9 Blood sugar tested every day: No Genitourinary Medical History: Negative for: End Stage Renal Disease Immunological Medical History: Negative for: Lupus Erythematosus; Raynauds; Scleroderma Integumentary (Skin) Medical History: Negative for: History of Burn Musculoskeletal Medical History: Positive for: Osteoarthritis Negative for: Gout; Rheumatoid Arthritis; Osteomyelitis Neurologic Medical History: Positive for: Neuropathy - Diabetic Negative for: Dementia; Quadriplegia; Paraplegia; Seizure Disorder Oncologic Medical History: Negative for: Received Chemotherapy; Received Radiation Psychiatric Medical History: Negative for: Anorexia/bulimia; Confinement Anxiety Immunizations Pneumococcal Vaccine: Received Pneumococcal Vaccination: No Implantable Devices No devices added Family and Social History Cancer: Yes - Mother,Father; Diabetes: Yes - Mother,Father,Siblings; Heart Disease: Yes - Siblings; Hereditary Spherocytosis: No; Hypertension: No; Kidney Disease: No; Lung Disease: Yes - Mother; Seizures: No; Stroke: No; Thyroid Problems: No; Tuberculosis: No; Never smoker; Marital Status - Single; Alcohol Use: Never; Drug Use: No History; Caffeine Use: Daily - Coffee; Financial Concerns: No; Food, Clothing or Shelter Needs: No; Support System Lacking: No; Transportation Concerns: No Electronic Signature(s) Signed: 01/10/2023 11:24:22 AM By: Duanne Guess MD FACS Entered By: Duanne Guess on 01/10/2023 10:39:07 -------------------------------------------------------------------------------- SuperBill Details Patient Name: Date of Service: [name], [name] 01/10/2023 Medical Record Number: [id number] Patient Account Number: [id number] Date of Birth/Sex: Treating RN: [birth year] (67 y.o. [name] Primary Care Provider: Debria Garret Other Clinician: Referring Provider: Treating Provider/Extender: Leeroy Bock in Treatment: 20 Diagnosis  Coding ICD-10 Codes Code Description L98.499 Non-pressure chronic ulcer of skin of other sites with unspecified severity K[place], [place] ([id number]) [id number] Page 9 of 9 E66.2 Morbid (severe) obesity with alveolar hypoventilation I73.9 Peripheral vascular disease, unspecified I50.32 Chronic diastolic (congestive) heart failure E11.622 Type 2 diabetes mellitus with other skin ulcer Facility Procedures : CPT4 Code: [id number] Description: 99213 - WOUND CARE VISIT-LEV 3 EST PT Modifier: Quantity: 1 Physician Procedures : CPT4 Code Description Modifier [id number] 99214 - WC PHYS LEVEL 4 - EST PT ICD-10 Diagnosis Description L98.499 Non-pressure chronic ulcer of skin of other sites with unspecified severity E66.2 Morbid (severe) obesity with alveolar hypoventilation E11.622  Type 2 diabetes mellitus with other skin ulcer I50.32 Chronic diastolic (congestive) heart failure Quantity: 1 Electronic Signature(s) Signed: 01/10/2023 10:42:01 AM By: Duanne Guess MD FACS Entered By: Duanne Guess on 01/10/2023 10:42:01

## 2023-01-11 NOTE — Progress Notes (Signed)
[name], [name] ([id number]) [id number] Page 1 of 8 Visit Report for 01/10/2023 Arrival Information Details Patient Name: Date of Service: [name], [name] 01/10/2023 10:30 A M Medical Record Number: [id number] Patient Account Number: [id number] Date of Birth/Sex: Treating RN: 05-16-1956 (67 y.o. [name] Primary Care Krimson Massmann: Debria Garret Other Clinician: Referring Latondra Gebhart: Treating Shakiya Mcneary/Extender: Leeroy Bock in Treatment: 20 Visit Information History Since Last Visit Added or deleted any medications: No Patient Arrived: Wheel Chair Any new allergies or adverse reactions: No Arrival Time: 09:48 Had a fall or experienced change in No Accompanied By: self activities of daily living that may affect Transfer Assistance: None risk of falls: Signs or symptoms of abuse/neglect since last visito No Hospitalized since last visit: No Implantable device outside of the clinic excluding No cellular tissue based products placed in the center since last visit: Has Dressing in Place as Prescribed: Yes Pain Present Now: No Notes stays in wheelchair Electronic Signature(s) Signed: 01/10/2023 4:38:05 PM By: Zenaida Deed RN, BSN Entered By: Zenaida Deed on 01/10/2023 09:49:30 -------------------------------------------------------------------------------- Clinic Level of Care Assessment Details Patient Name: Date of Service: [name], [name] 01/10/2023 10:30 A M Medical Record Number: [id number] Patient Account Number: [id number] Date of Birth/Sex: Treating RN: 1956/05/22 (67 y.o. [name] Primary Care Ferris Fielden: Debria Garret Other Clinician: Referring Nadiyah Zeis: Treating Carlei Huang/Extender: Leeroy Bock in Treatment: 20 Clinic Level of Care Assessment Items TOOL 4 Quantity Score []  - 0 Use when only an EandM is performed on FOLLOW-UP visit ASSESSMENTS - Nursing Assessment /  Reassessment X- 1 10 Reassessment of Co-morbidities (includes updates in patient status) X- 1 5 Reassessment of Adherence to Treatment Plan ASSESSMENTS - Wound and Skin A ssessment / Reassessment X - Simple Wound Assessment / Reassessment - one wound 1 5 []  - 0 Complex Wound Assessment / Reassessment - multiple wounds []  - 0 Dermatologic / Skin Assessment (not related to wound area) ASSESSMENTS - Focused Assessment []  - 0 Circumferential Edema Measurements - multi extremities []  - 0 Nutritional Assessment / Counseling / Intervention []  - 0 Lower Extremity Assessment (monofilament, tuning fork, pulses) []  - 0 Peripheral Arterial Disease Assessment (using hand held doppler) ASSESSMENTS - Ostomy and/or Continence Assessment and Care []  - 0 Incontinence Assessment and Management [name], [name] ([id number]) [id number] Page 2 of 8 []  - 0 Ostomy Care Assessment and Management (repouching, etc.) PROCESS - Coordination of Care X - Simple Patient / Family Education for ongoing care 1 15 []  - 0 Complex (extensive) Patient / Family Education for ongoing care X- 1 10 Staff obtains Chiropractor, Records, T Results / Process Orders est X- 1 10 Staff telephones HHA, Nursing Homes / Clarify orders / etc []  - 0 Routine Transfer to another Facility (non-emergent condition) []  - 0 Routine Hospital Admission (non-emergent condition) []  - 0 New Admissions / Manufacturing engineer / Ordering NPWT Apligraf, etc. , []  - 0 Emergency Hospital Admission (emergent condition) X- 1 10 Simple Discharge Coordination []  - 0 Complex (extensive) Discharge Coordination PROCESS - Special Needs []  - 0 Pediatric / Minor Patient Management []  - 0 Isolation Patient Management []  - 0 Hearing / Language / Visual special needs []  - 0 Assessment of Community assistance (transportation, D/C planning, etc.) []  - 0 Additional assistance / Altered mentation []  - 0 Support Surface(s)  Assessment (bed, cushion, seat, etc.) INTERVENTIONS - Wound Cleansing / Measurement X - Simple Wound Cleansing - one wound 1 5 []  - 0 Complex Wound Cleansing - multiple wounds X-  1 5 Wound Imaging (photographs - any number of wounds)  - 0 Wound Tracing (instead of photographs) X- 1 5 Simple Wound Measurement - one wound  - 0 Complex Wound Measurement - multiple wounds INTERVENTIONS - Wound Dressings X - Small Wound Dressing one or multiple wounds 1 10  - 0 Medium Wound Dressing one or multiple wounds  - 0 Large Wound Dressing one or multiple wounds  - 0 Application of Medications - topical  - 0 Application of Medications - injection INTERVENTIONS - Miscellaneous  - 0 External ear exam  - 0 Specimen Collection (cultures, biopsies, blood, body fluids, etc.)  - 0 Specimen(s) / Culture(s) sent or taken to Lab for analysis  - 0 Patient Transfer (multiple staff / Nurse, adult / Similar devices)  - 0 Simple Staple / Suture removal (25 or less)  - 0 Complex Staple / Suture removal (26 or more)  - 0 Hypo / Hyperglycemic Management (close monitor of Blood Glucose)  - 0 Ankle / Brachial Index (ABI) - do not check if billed separately X- 1 5 Vital Signs Has the patient been seen at the hospital within the last three years: Yes Total Score: 95 Level Of Care: New/Established - Level 3 [name], [name] ([id number]) [id number] Page 3 of 8 Electronic Signature(s) Signed: 01/10/2023 4:38:05 PM By: Zenaida Deed RN, BSN Entered By: Zenaida Deed on 01/10/2023 10:10:31 -------------------------------------------------------------------------------- Encounter Discharge Information Details Patient Name: Date of Service: [name], [name] 01/10/2023 10:30 A M Medical Record Number: [id number] Patient Account Number: [id number] Date of Birth/Sex: Treating RN: [birth year] (67 y.o. [name] Primary Care May Ozment: Debria Garret  Other Clinician: Referring Taiten [name]: Treating Mosie Angus/Extender: Leeroy Bock in Treatment: 20 Encounter Discharge Information Items Discharge Condition: Stable Ambulatory Status: Wheelchair Discharge Destination: Home Transportation: Private Auto Accompanied By: self Schedule Follow-up Appointment: Yes Clinical Summary of Care: Patient Declined Electronic Signature(s) Signed: 01/10/2023 4:38:05 PM By: Zenaida Deed RN, BSN Entered By: Zenaida Deed on 01/10/2023 10:11:16 -------------------------------------------------------------------------------- Lower Extremity Assessment Details Patient Name: Date of Service: [name], [name] 01/10/2023 10:30 A M Medical Record Number: [id number] Patient Account Number: [id number] Date of Birth/Sex: Treating RN: 07-May-1956 (67 y.o. [name] Primary Care Javione Gunawan: Debria Garret Other Clinician: Referring Oseias Horsey: Treating Jezreel Sisk/Extender: Leeroy Bock in Treatment: 20 Electronic Signature(s) Signed: 01/10/2023 4:38:05 PM By: Zenaida Deed RN, BSN Entered By: Zenaida Deed on 01/10/2023 09:50:35 -------------------------------------------------------------------------------- Multi Wound Chart Details Patient Name: Date of Service: [name], [name] 01/10/2023 10:30 A M Medical Record Number: [id number] Patient Account Number: [id number] Date of Birth/Sex: Treating RN: 10-16-1955 (67 y.o. M) Primary Care Henery Betzold: Debria Garret Other Clinician: Referring Taylen Wendland: Treating Quinta Eimer/Extender: Leeroy Bock in Treatment: 20 Vital Signs Height(in): 72 Pulse(bpm): 89 Weight(lbs): 400 Blood Pressure(mmHg): 102/67 Body Mass Index(BMI): 54.2 Temperature(F): 98.2 Respiratory Rate(breaths/min): 18 [25:Photos:] [N/A:N/A] Superior Abdomen - midline N/A N/A Wound Location: Gradually Appeared N/A N/A Wounding Event: Atypical N/A N/A Primary  Etiology: Sleep Apnea, Congestive Heart N/A N/A Comorbid History: Failure, Hypertension, Type II Diabetes, Osteoarthritis, Neuropathy 11/07/2022 N/A N/A Date Acquired: 9 N/A N/A Weeks of Treatment: Open N/A N/A Wound Status: No N/A N/A Wound Recurrence: 0.2x0.2x4.5 N/A N/A Measurements L x W x D (cm) 0.031 N/A N/A A (cm) : rea 0.141 N/A N/A Volume (cm) : 83.50% N/A N/A % Reduction in Area: 72.30% N/A N/A % Reduction in Volume: Full Thickness Without Exposed N/A N/A Classification: Support Structures Medium N/A N/A Exudate Amount: Serosanguineous N/A N/A Exudate Type:  red, brown N/A N/A Exudate Color: Distinct, outline attached N/A N/A Wound Margin: None Present (0%) N/A N/A Granulation Amount: None Present (0%) N/A N/A Necrotic Amount: Fat Layer (Subcutaneous Tissue): Yes N/A N/A Exposed Structures: Fascia: No Tendon: No Muscle: No Joint: No Bone: No None N/A N/A Epithelialization: No Abnormalities Noted N/A N/A Periwound Skin Texture: No Abnormalities Noted N/A N/A Periwound Skin Moisture: No Abnormalities Noted N/A N/A Periwound Skin Color: No Abnormality N/A N/A Temperature: probes to implanted device N/A N/A Assessment Notes: Treatment Notes Wound #25 (Abdomen - midline) Wound Laterality: Superior Cleanser Soap and Water Discharge Instruction: May shower and wash wound with dial antibacterial soap and water prior to dressing change. Wound Cleanser Discharge Instruction: Cleanse the wound with wound cleanser prior to applying a clean dressing using gauze sponges, not tissue or cotton balls. Peri-Wound Care Topical Primary Dressing Iodoform packing strip 1/4 (in) Discharge Instruction: Lightly pack as instructed Secondary Dressing Zetuvit Plus Silicone Border Dressing 4x4 (in/in) Discharge Instruction: Apply silicone border over primary dressing as directed. Secured With Compression Wrap Compression Stockings Geologist, engineering) Signed: 01/10/2023 10:38:00 AM By: Duanne Guess MD FACS Entered By: Duanne Guess on 01/10/2023 10:38:00 Multi-Disciplinary Care Plan Details -------------------------------------------------------------------------------- [name] ([id number]) [id number] Page 5 of 8 Patient Name: Date of Service: [name], [name] 01/10/2023 10:30 A M Medical Record Number: [id number] Patient Account Number: [id number] Date of Birth/Sex: Treating RN: 11-02-1955 (67 y.o. [name] Primary Care Shmuel Girgis: Debria Garret Other Clinician: Referring Hannie Shoe: Treating Alegria Dominique/Extender: Leeroy Bock in Treatment: 20 Multidisciplinary Care Plan reviewed with physician Active Inactive Abuse / Safety / Falls / Self Care Management Nursing Diagnoses: Impaired physical mobility Potential for falls Goals: Patient/caregiver will verbalize/demonstrate measure taken to improve self care Date Initiated: 10/24/2022 Target Resolution Date: 02/17/2023 Goal Status: Active Patient/caregiver will verbalize/demonstrate measures taken to improve the patient's personal safety Date Initiated: 10/24/2022 Target Resolution Date: 02/17/2023 Goal Status: Active Interventions: Provide education on fall prevention Notes: Wound/Skin Impairment Nursing Diagnoses: Knowledge deficit related to smoking impact on wound healing Goals: Patient/caregiver will verbalize understanding of skin care regimen Date Initiated: 12/13/2022 Date Inactivated: 12/27/2022 Target Resolution Date: 12/26/2022 Goal Status: Met Ulcer/skin breakdown will have a volume reduction of 30% by week 4 Date Initiated: 08/18/2022 Target Resolution Date: 02/07/2023 Goal Status: Active Interventions: Assess patient/caregiver ability to obtain necessary supplies Assess patient/caregiver ability to perform ulcer/skin care regimen upon admission and as needed Assess ulceration(s) every  visit Provide education on ulcer and skin care Treatment Activities: Skin care regimen initiated : 08/18/2022 Notes: Electronic Signature(s) Signed: 01/10/2023 4:38:05 PM By: Zenaida Deed RN, BSN Entered By: Zenaida Deed on 01/10/2023 09:58:46 -------------------------------------------------------------------------------- Pain Assessment Details Patient Name: Date of Service: [name], [name] 01/10/2023 10:30 A M Medical Record Number: [id number] Patient Account Number: [id number] Date of Birth/Sex: Treating RN: 1956/04/19 (66 y.o. [name] Primary Care Lenn Volker: Debria Garret Other Clinician: Referring Tinia Oravec: Treating Issa Luster/Extender: Leeroy Bock in Treatment: 20 Active Problems Location of Pain Severity and Description of Pain Patient Has Paino No [name], [name] ([id number]) [id number] Page 6 of 8 Patient Has Paino No Site Locations Rate the pain. Current Pain Level: 0 Pain Management and Medication Current Pain Management: Electronic Signature(s) Signed: 01/10/2023 4:38:05 PM By: Zenaida Deed RN, BSN Entered By: Zenaida Deed on 01/10/2023 09:50:27 -------------------------------------------------------------------------------- Patient/Caregiver Education Details Patient Name: Date of Service: [name], Jenner 4/23/2024andnbsp10:30 A M Medical Record Number: [id number] Patient Account Number: [id number] Date of Birth/Gender: Treating RN: November 03, 1955 (67  y.o. [name] Primary Care Physician: Debria Garret Other Clinician: Referring Physician: Treating Physician/Extender: Leeroy Bock in Treatment: 20 Education Assessment Education Provided To: Patient Education Topics Provided Wound/Skin Impairment: Methods: Explain/Verbal Responses: Reinforcements needed, State content correctly [employer]) Signed: 01/10/2023 4:38:05 PM By: Zenaida Deed RN,  BSN Entered By: Zenaida Deed on 01/10/2023 09:59:08 -------------------------------------------------------------------------------- Wound Assessment Details Patient Name: Date of Service: [name], [name] 01/10/2023 10:30 A M Medical Record Number: [id number] Patient Account Number: [id number] Date of Birth/Sex: Treating RN: 07/16/1956 (67 y.o. [name] Primary Care Kari Montero: Debria Garret Other Clinician: Referring [place]ia Noori: Treating Lyncoln Ledgerwood/Extender: Kathleen Lime Weeks in Treatment: 20 Wound Status Wound Number: 25 Primary Atypical Etiology: Wound Location: Superior Abdomen - midline Wound Open Wounding Event: Gradually Appeared Status: Date Acquired: 11/07/2022 [name] ([id number]) [phone].pdf Page 7 of 8 Date Acquired: 11/07/2022 Comorbid Sleep Apnea, Congestive Heart Failure, Hypertension, Type II Weeks Of Treatment: 9 History: Diabetes, Osteoarthritis, Neuropathy Clustered Wound: No Photos Wound Measurements Length: (cm) 0.2 Width: (cm) 0.2 Depth: (cm) 4.5 Area: (cm) 0.031 Volume: (cm) 0.141 % Reduction in Area: 83.5% % Reduction in Volume: 72.3% Epithelialization: None Tunneling: No Undermining: No Wound Description Classification: Full Thickness Without Exposed Support Structures Wound Margin: Distinct, outline attached Exudate Amount: Medium Exudate Type: Serosanguineous Exudate Color: red, brown Foul Odor After Cleansing: No Slough/Fibrino No Wound Bed Granulation Amount: None Present (0%) Exposed Structure Necrotic Amount: None Present (0%) Fascia Exposed: No Fat Layer (Subcutaneous Tissue) Exposed: Yes Tendon Exposed: No Muscle Exposed: No Joint Exposed: No Bone Exposed: No Periwound Skin Texture Texture Color No Abnormalities Noted: Yes No Abnormalities Noted: Yes Moisture Temperature / Pain No Abnormalities Noted: Yes Temperature: No Abnormality Assessment Notes probes to  implanted device Treatment Notes Wound #25 (Abdomen - midline) Wound Laterality: Superior Cleanser Soap and Water Discharge Instruction: May shower and wash wound with dial antibacterial soap and water prior to dressing change. Wound Cleanser Discharge Instruction: Cleanse the wound with wound cleanser prior to applying a clean dressing using gauze sponges, not tissue or cotton balls. Peri-Wound Care Topical Primary Dressing Iodoform packing strip 1/4 (in) Discharge Instruction: Lightly pack as instructed Secondary Dressing Zetuvit Plus Silicone Border Dressing 4x4 (in/in) Discharge Instruction: Apply silicone border over primary dressing as directed. Secured With [place] [place], [name] ([id number]) [id number] Page 8 of 8 Compression Wrap Compression Stockings Add-Ons Electronic Signature(s) Signed: 01/10/2023 4:38:05 PM By: Zenaida Deed RN, BSN Entered By: Zenaida Deed on 01/10/2023 10:04:14 -------------------------------------------------------------------------------- Vitals Details Patient Name: Date of Service: [name], [name] 01/10/2023 10:30 A M Medical Record Number: [id number] Patient Account Number: [id number] Date of Birth/Sex: Treating RN: 05-06-1956 (67 y.o. [name] Primary Care Aletha Allebach: Debria Garret Other Clinician: Referring Kori Colin: Treating Beonca Gibb/Extender: Leeroy Bock in Treatment: 20 Vital Signs Time Taken: 09:49 Temperature (F): 98.2 Height (in): 72 Pulse (bpm): 89 Weight (lbs): 400 Respiratory Rate (breaths/min): 18 Body Mass Index (BMI): 54.2 Blood Pressure (mmHg): 102/67 Reference Range: 80 - 120 mg / dl Electronic Signature(s) Signed: 01/10/2023 4:38:05 PM By: Zenaida Deed RN, BSN Entered By: Zenaida Deed on 01/10/2023 09:50:14

## 2023-01-13 NOTE — Progress Notes (Unsigned)
Patient presents to the office today for diabetic shoe and insole measuring.   ABN signed.    Documentation of medical necessity will be sent to patient's treating diabetic doctor to verify and sign.    Patient's diabetic provider: Dr. Sharmon Revere     Shoes and insoles will be ordered at that time and patient will be notified for an appointment for fitting when they arrive.     Patient shoe selection-              1st   Shoe choice:   [id number]   Shoe size ordered: 15 2E

## 2023-01-19 NOTE — Telephone Encounter (Signed)
-----   Message from Lennette Bihari, MD sent at 12/16/2022  9:16 AM EDT ----- Burna Mortimer, please notify p[t and set up with DME for BiPAP and supplemental oxygen.

## 2023-01-19 NOTE — Telephone Encounter (Addendum)
Left message sleep study has been completed. BIPAP order has been sent to [facility] via [facility] portal. Call back if questions or concerns.

## 2023-01-24 ENCOUNTER — Encounter (HOSPITAL_BASED_OUTPATIENT_CLINIC_OR_DEPARTMENT_OTHER): Payer: 59 | Admitting: General Surgery

## 2023-02-28 ENCOUNTER — Encounter: Payer: Self-pay | Admitting: Podiatry

## 2023-02-28 ENCOUNTER — Encounter (HOSPITAL_BASED_OUTPATIENT_CLINIC_OR_DEPARTMENT_OTHER): Payer: 59 | Attending: General Surgery | Admitting: General Surgery

## 2023-02-28 ENCOUNTER — Ambulatory Visit (INDEPENDENT_AMBULATORY_CARE_PROVIDER_SITE_OTHER): Payer: 59 | Admitting: Podiatry

## 2023-02-28 ENCOUNTER — Ambulatory Visit: Payer: Medicare PPO | Admitting: Podiatry

## 2023-02-28 VITALS — BP 121/57 | HR 64 | Temp 97.6°F

## 2023-02-28 DIAGNOSIS — L84 Corns and callosities: Secondary | ICD-10-CM | POA: Diagnosis not present

## 2023-02-28 DIAGNOSIS — D509 Iron deficiency anemia, unspecified: Secondary | ICD-10-CM | POA: Diagnosis not present

## 2023-02-28 DIAGNOSIS — N186 End stage renal disease: Secondary | ICD-10-CM | POA: Diagnosis not present

## 2023-02-28 DIAGNOSIS — E1142 Type 2 diabetes mellitus with diabetic polyneuropathy: Secondary | ICD-10-CM | POA: Diagnosis not present

## 2023-02-28 DIAGNOSIS — M79675 Pain in left toe(s): Secondary | ICD-10-CM | POA: Diagnosis not present

## 2023-02-28 DIAGNOSIS — M1990 Unspecified osteoarthritis, unspecified site: Secondary | ICD-10-CM | POA: Diagnosis not present

## 2023-02-28 DIAGNOSIS — E1122 Type 2 diabetes mellitus with diabetic chronic kidney disease: Secondary | ICD-10-CM | POA: Insufficient documentation

## 2023-02-28 DIAGNOSIS — I5032 Chronic diastolic (congestive) heart failure: Secondary | ICD-10-CM | POA: Insufficient documentation

## 2023-02-28 DIAGNOSIS — Z992 Dependence on renal dialysis: Secondary | ICD-10-CM | POA: Insufficient documentation

## 2023-02-28 DIAGNOSIS — M79674 Pain in right toe(s): Secondary | ICD-10-CM | POA: Diagnosis not present

## 2023-02-28 DIAGNOSIS — L97429 Non-pressure chronic ulcer of left heel and midfoot with unspecified severity: Secondary | ICD-10-CM | POA: Diagnosis not present

## 2023-02-28 DIAGNOSIS — E11622 Type 2 diabetes mellitus with other skin ulcer: Secondary | ICD-10-CM | POA: Insufficient documentation

## 2023-02-28 DIAGNOSIS — I132 Hypertensive heart and chronic kidney disease with heart failure and with stage 5 chronic kidney disease, or end stage renal disease: Secondary | ICD-10-CM | POA: Insufficient documentation

## 2023-02-28 DIAGNOSIS — Z833 Family history of diabetes mellitus: Secondary | ICD-10-CM | POA: Insufficient documentation

## 2023-02-28 DIAGNOSIS — L98499 Non-pressure chronic ulcer of skin of other sites with unspecified severity: Secondary | ICD-10-CM | POA: Insufficient documentation

## 2023-02-28 DIAGNOSIS — B351 Tinea unguium: Secondary | ICD-10-CM

## 2023-02-28 DIAGNOSIS — G4730 Sleep apnea, unspecified: Secondary | ICD-10-CM | POA: Insufficient documentation

## 2023-02-28 DIAGNOSIS — E1151 Type 2 diabetes mellitus with diabetic peripheral angiopathy without gangrene: Secondary | ICD-10-CM | POA: Insufficient documentation

## 2023-02-28 NOTE — Progress Notes (Signed)
[name], [place] ([id number]) [id number] Page 1 of 9 Visit Report for 02/28/2023 Chief Complaint Document Details Patient Name: Date of Service: [name], [name] 02/28/2023 8:15 A M Medical Record Number: [id number] Patient Account Number: [id number] Date of Birth/Sex: Treating RN: [birth year] (67 y.o. M) Primary Care Provider: Debria Garret Other Clinician: Referring Provider: Treating Provider/Extender: Leeroy Bock in Treatment: 27 Information Obtained from: Patient Chief Complaint 11/09/2018; patient is here for review of wounds on his dorsal right first and second toes and on the dorsal left first toe. 02/22/2022: The patient is here for a new ulcer on his posterior left heel. 08/18/2022: here with new wound on abdomen, similar to prior Electronic Signature(s) Signed: 02/28/2023 8:41:32 AM By: Duanne Guess MD FACS Entered By: Duanne Guess on 02/28/2023 08:41:31 -------------------------------------------------------------------------------- HPI Details Patient Name: Date of Service: [name], [name] 02/28/2023 8:15 A M Medical Record Number: [id number] Patient Account Number: [id number] Date of Birth/Sex: Treating RN: Dec 04, 1955 (67 y.o. M) Primary Care Provider: Debria Garret Other Clinician: Referring Provider: Treating Provider/Extender: Leeroy Bock in Treatment: 27 History of Present Illness HPI Description: READMISSION 11/09/2018 This is a now 67 year old man that we had in this clinic over a multitude of years but most recently in 2015. He had wounds on his plantar foot initially in 2007 and 2008 and I think subsequently was seen in 2012 then 2014 into the mid part of 2015 with wounds on his toes. He is a type II diabetic with peripheral neuropathy but no known PAD. The patient states he was doing well until about a month ago he noted an area on the left great toe which was superficial and then  an area on the right great and second toes about a week later. He is not sure how this happened he simply noticed this when he was lying in bed at night. He has been using peroxide. He has not seen a doctor. He has not been on antibiotics and has had no x-rays. Past medical history; type 2 diabetes with peripheral neuropathy, hypertension, hypothyroidism, low B12 levels, iron deficiency anemia, ventral hernia, diabetic nephropathy, chronic diastolic heart failure and obstructive sleep apnea. ABIs in this clinic were 1.2 on the right and 1.1 on the left 2/28; patient arrived last week for areas on his left first right first and right second toes. X-ray of the right foot did not show any osseous abnormality. Culture of the right great toe showed Staphylococcus aureus methicillin sensitive and he is on dicloxacillin which he started 3 days ago. 3/6; patient with wounds on his right first and second toes and left first toes. The area over the left first toe is extensive we have been using silver alginate. Culture grew MSSA and he is on dicloxacillin which I prescribed on the 25th. He is still taking this which I am not really sure of the reason. He expressed knowledge that this was 4 times a day he should have been out of this around March 2 if he picked it up on the 25th or they gave him the right number of pills. Nevertheless our nurses report more purulent drainage and odor 3/17-Patient returns for the 3 wounds the left first toe and the right first and second toe dorsal surfaces. The left first toe is closed up and healed, the right first toe dorsal surface wound is extensive and appears the same as last time, no purulent drainage or odor reported per nurse, left second toe wound is slightly smaller. He  has completed the dicloxacillin as of today. Noted that his culture previously had grown MSSA. We have been using calcium alginate to the wounds. X-rays have been reviewed. ABIs were reviewed as  well. 3/27; patient returns for review of wounds on his right first and right second toes. The wound on the left first toe is healed. 4/10; 2-week follow-up. The area on the right second toe dorsally is healed. Per the patient the left first toe remains healed. He still has an extensive area dorsally over the right first toe inner phalangeal joint area. Rolled up hyper granulated tissue with probably a nonviable surface 4/24; 2-week follow-up. The areas on the right second toe dorsally and right first toe dorsally is healed. The area on the dorsal left first toe over the inner phalangeal joint is smaller but still hyper granulated we have been using Hydrofera Blue 5/15; 3-week follow-up. The areas on the right first toe. Not the left as I said on 4/24. He is using Hydrofera Blue. 5/29; 3-week follow-up. The area on his dorsal right toe over the right inter phalangeal joint is just about closed. We have been using Hydrofera Blue. In passing he showed me his right thumb which is exquisitely tender over the tip of the digit. 6/5; the area on the dorsal right toe over the inner phalangeal joint is closed. Right thumb is better which I gave him antibiotics for last week. Still some tenderness but considerably improved [name], [name] ([id number]) [id number] Page 2 of 9 02/22/2022 This is a 67 year old type II diabetic with congestive heart failure, end-stage renal disease on hemodialysis, peripheral artery disease, and morbid obesity. He has developed an ulcer on his left posterior heel that he believes is secondary to his health assistance not adequately moisturizing his feet. He says it first appeared about 2 weeks ago. He does not have sensation in his feet. He has not noticed any foul odor or drainage. His last hemoglobin A1c was 7.9 at the beginning of January. ABI in clinic today was 1.15. 03/09/2022: He has a new wound on his abdomen that he says started out as  a lump and then the skin broke open when he bumped it on a portion of his hospital bed at home. There is fat exposed. The wound on his heel is a little bit smaller but still has a very pale surface. There is periwound callus accumulation. 03/30/2022: He had another small wound opened up on his abdomen. He says that he thinks these are starting as blisters. He has been on Mounjaro for weight loss and it looks like the loose skin resulting from losing weight might be rubbing and causing small friction injuries. The wound that I treated with chemical cauterization last visit is much smaller; the new wound has a very similar appearance to this. His heel ulcer is smaller with just a little bit of slough buildup on the surface. He continues to have fairly impressive periwound callus formation. 04/07/2022: Both abdominal wounds are smaller today and there is less hypertrophic granulation tissue protruding. The wound on his heel also continues to contract. There is just a layer of slough on the surface. No concern for infection. 04/29/2022: The previous 2 abdominal wounds have closed but he has a new 1 that appears identical to the previous lesions. The wound on his heel is smaller but has a thick layer of callus overlying it. 05/13/2022: His heel wound has healed. The wound on his abdomen remains small but still has some hypertrophic  granulation tissue present. He says it drains serous fluid from time to time. READMISSION 08/18/2022: [name] returns with reopening of the small wound on his abdomen. He said it closed and then he recently went swimming and later that day, he noted some serosanguineous drainage on his shirt. He has not had any fevers or chills. The drainage has never been purulent or foul-smelling. As per his previous admission, the wound is small and circular with fat exposed. No concern for infection. 12/14; the patient's original wound that he came in with has healed and is epithelialized  however just below this he has another open area. He noticed this 3 days ago with a significant amount of sanguinous drainage. No pain no obvious purulence. 09/07/2022: The wound is shallower this week. No purulent drainage identified. The culture that was taken grew out just a small amount of Staph aureus pansensitive except to ciprofloxacin. No antibiotic was prescribed by our clinic, but apparently the patient's primary care provider prescribed an antibiotic, but he does not recall the name and it is not available in the electronic medical record. 09/29/2022: Apparently the patient's home health providers have not been packing the wound. It now probes to a depth of 2.4 cm. It is clean without any slough or purulent drainage. No concern for infection. 10/24/2022: No real change to the wound. There is still about the same amount of depth. The orifice is narrow and this seems to be complicating appropriate packing of the site. 11/07/2022: The existing wound closed and the previous one reopened. It probes to 2.7 cm. No purulent drainage or malodor. 11/21/2022: The depth of the wound is a little bit less today. The opening to the wound is quite narrow. He has an appointment with [facility] coming up on March 21. No concern for infection. 12/13/2022: He met with [facility] and they confirmed that these were sinus tracts coming from his Lap-Band port. They want to confirm that his band does not need to be removed in addition to the port before proceeding with surgical intervention. He is scheduled for an upper GI to evaluate this on Thursday. They are also waiting to upload the CT scan that he had performed in [place] so that they can evaluate the site of concern via imaging, as well. No significant changes to the wound itself. 12/27/2022: The sinus tract is a little bit shallower today, but really no significant changes. He had his upper GI and he is awaiting communication from  the bariatric surgeon. Unfortunately the home health agency was not performing his wound care correctly and he dismissed them. 01/10/2023: No real change to the wound. I can still feel the reservoir for his Lap-Band when I probe the wound. No purulent drainage. He still has not heard anything from the bariatric surgeon. 02/28/2023: He returns after a substantial absence. He is scheduled to undergo surgery next week at [facility] to have his Lap-Band reservoir removed. The previous tunnel has closed and the wound has opened. No purulent drainage. No fevers or chills. Electronic Signature(s) Signed: 02/28/2023 8:42:54 AM By: Duanne Guess MD FACS Entered By: Duanne Guess on 02/28/2023 08:42:54 -------------------------------------------------------------------------------- Physical Exam Details Patient Name: Date of Service: [name], [name] 02/28/2023 8:15 A M Medical Record Number: [id number] Patient Account Number: [id number] Date of Birth/Sex: Treating RN: 1956/05/03 (67 y.o. M) Primary Care Provider: Debria Garret Other Clinician: Referring Provider: Treating Provider/Extender: Leeroy Bock in Treatment: 27 Constitutional . . . . no acute distress.  Respiratory Normal work of breathing on room air. Notes [name], [name] ([id number]) [id number] Page 3 of 9 The previous tunnel has closed and a new wound has opened. No purulent drainage. No fevers or chills. Electronic Signature(s) Signed: 02/28/2023 8:44:39 AM By: Duanne Guess MD FACS Entered By: Duanne Guess on 02/28/2023 08:44:39 -------------------------------------------------------------------------------- Physician Orders Details Patient Name: Date of Service: [name], [name] 02/28/2023 8:15 A M Medical Record Number: [id number] Patient Account Number: [id number] Date of Birth/Sex: Treating RN: 1956-08-15 (67 y.o. [name] Primary Care Provider: Debria Garret  Other Clinician: Referring Provider: Treating Provider/Extender: Leeroy Bock in Treatment: 27 Verbal / Phone Orders: No Diagnosis Coding ICD-10 Coding Code Description L98.499 Non-pressure chronic ulcer of skin of other sites with unspecified severity E66.2 Morbid (severe) obesity with alveolar hypoventilation I73.9 Peripheral vascular disease, unspecified I50.32 Chronic diastolic (congestive) heart failure E11.622 Type 2 diabetes mellitus with other skin ulcer Follow-up Appointments ppointment in: - if needed after surgery at [facility] Return A Bathing/ Shower/ Hygiene May shower and wash wound with soap and water. - Change dressing every other day Home Health No change in wound care orders this week; continue Home Health for wound care. May utilize formulary equivalent dressing for wound treatment orders unless otherwise specified. Dressing changes to be completed by Home Health on Monday / Wednesday / Friday except when patient has scheduled visit at [facility]. Other Home Health Orders/Instructions: - [facility] Wound #26 - Abdomen - midline Wound Laterality: Right Cleanser: Soap and Water 3 x Per Week/30 Days Discharge Instructions: May shower and wash wound with dial antibacterial soap and water prior to dressing change. Cleanser: Wound Cleanser 3 x Per Week/30 Days Discharge Instructions: Cleanse the wound with wound cleanser prior to applying a clean dressing using gauze sponges, not tissue or cotton balls. Prim Dressing: Iodoform packing strip 1/4 (in) 3 x Per Week/30 Days ary Discharge Instructions: Lightly pack as instructed Secondary Dressing: Zetuvit Plus Silicone Border Dressing 4x4 (in/in) 3 x Per Week/30 Days Discharge Instructions: Apply silicone border over primary dressing as directed. Electronic Signature(s) Signed: 02/28/2023 8:44:49 AM By: Duanne Guess MD FACS Entered By: Duanne Guess on 02/28/2023  08:44:48 -------------------------------------------------------------------------------- Problem List Details Patient Name: Date of Service: [name], [name] 02/28/2023 8:15 A M Medical Record Number: [id number] Patient Account Number: [id number] Date of Birth/Sex: Treating RN: Jul 08, 1956 (67 y.o. [name] Primary Care Provider: Debria Garret Other Clinician: Referring Provider: Treating Provider/Extender: Leeroy Bock in Treatment: [address] [place] ([id number]) [id number] Page 4 of 9 Active Problems ICD-10 Encounter Code Description Active Date MDM Diagnosis L98.499 Non-pressure chronic ulcer of skin of other sites with unspecified severity 08/18/2022 No Yes E66.2 Morbid (severe) obesity with alveolar hypoventilation 08/18/2022 No Yes I73.9 Peripheral vascular disease, unspecified 08/18/2022 No Yes I50.32 Chronic diastolic (congestive) heart failure 08/18/2022 No Yes E11.622 Type 2 diabetes mellitus with other skin ulcer 08/18/2022 No Yes Inactive Problems Resolved Problems Electronic Signature(s) Signed: 02/28/2023 8:38:25 AM By: Duanne Guess MD FACS Entered By: Duanne Guess on 02/28/2023 08:38:25 -------------------------------------------------------------------------------- Progress Note Details Patient Name: Date of Service: [name], [name] 02/28/2023 8:15 A M Medical Record Number: [id number] Patient Account Number: [id number] Date of Birth/Sex: Treating RN: [birth year] (67 y.o. M) Primary Care Provider: Debria Garret Other Clinician: Referring Provider: Treating Provider/Extender: Leeroy Bock in Treatment: 27 Subjective Chief Complaint Information obtained from Patient 11/09/2018; patient is here for review of wounds on his dorsal right first and second toes and  on the dorsal left first toe. 02/22/2022: The patient is here for a new ulcer on his posterior left heel.  08/18/2022: here with new wound on abdomen, similar to prior History of Present Illness (HPI) READMISSION 11/09/2018 This is a now 67 year old man that we had in this clinic over a multitude of years but most recently in 2015. He had wounds on his plantar foot initially in 2007 and 2008 and I think subsequently was seen in 2012 then 2014 into the mid part of 2015 with wounds on his toes. He is a type II diabetic with peripheral neuropathy but no known PAD. The patient states he was doing well until about a month ago he noted an area on the left great toe which was superficial and then an area on the right great and second toes about a week later. He is not sure how this happened he simply noticed this when he was lying in bed at night. He has been using peroxide. He has not seen a doctor. He has not been on antibiotics and has had no x-rays. Past medical history; type 2 diabetes with peripheral neuropathy, hypertension, hypothyroidism, low B12 levels, iron deficiency anemia, ventral hernia, diabetic nephropathy, chronic diastolic heart failure and obstructive sleep apnea. ABIs in this clinic were 1.2 on the right and 1.1 on the left 2/28; patient arrived last week for areas on his left first right first and right second toes. X-ray of the right foot did not show any osseous abnormality. Culture of the right great toe showed Staphylococcus aureus methicillin sensitive and he is on dicloxacillin which he started 3 days ago. 3/6; patient with wounds on his right first and second toes and left first toes. The area over the left first toe is extensive we have been using silver alginate. Culture grew MSSA and he is on dicloxacillin which I prescribed on the 25th. He is still taking this which I am not really sure of the reason. He expressed knowledge that this was 4 times a day he should have been out of this around March 2 if he picked it up on the 25th or they gave him the right number of  pills. Nevertheless our nurses report more purulent drainage and odor 3/17-Patient returns for the 3 wounds the left first toe and the right first and second toe dorsal surfaces. The left first toe is closed up and healed, the right first toe dorsal surface wound is extensive and appears the same as last time, no purulent drainage or odor reported per nurse, left second toe wound is slightly smaller. [name], [place] ([id number]) [id number] Page 5 of 9 He has completed the dicloxacillin as of today. Noted that his culture previously had grown MSSA. We have been using calcium alginate to the wounds. X-rays have been reviewed. ABIs were reviewed as well. 3/27; patient returns for review of wounds on his right first and right second toes. The wound on the left first toe is healed. 4/10; 2-week follow-up. The area on the right second toe dorsally is healed. Per the patient the left first toe remains healed. He still has an extensive area dorsally over the right first toe inner phalangeal joint area. Rolled up hyper granulated tissue with probably a nonviable surface 4/24; 2-week follow-up. The areas on the right second toe dorsally and right first toe dorsally is healed. The area on the dorsal left first toe over the inner phalangeal joint is smaller but still hyper granulated we have been using Hydrofera Blue  5/15; 3-week follow-up. The areas on the right first toe. Not the left as I said on 4/24. He is using Hydrofera Blue. 5/29; 3-week follow-up. The area on his dorsal right toe over the right inter phalangeal joint is just about closed. We have been using Hydrofera Blue. In passing he showed me his right thumb which is exquisitely tender over the tip of the digit. 6/5; the area on the dorsal right toe over the inner phalangeal joint is closed. Right thumb is better which I gave him antibiotics for last week. Still some tenderness but considerably  improved READMISSION 02/22/2022 This is a 67 year old type II diabetic with congestive heart failure, end-stage renal disease on hemodialysis, peripheral artery disease, and morbid obesity. He has developed an ulcer on his left posterior heel that he believes is secondary to his health assistance not adequately moisturizing his feet. He says it first appeared about 2 weeks ago. He does not have sensation in his feet. He has not noticed any foul odor or drainage. His last hemoglobin A1c was 7.9 at the beginning of January. ABI in clinic today was 1.15. 03/09/2022: He has a new wound on his abdomen that he says started out as a lump and then the skin broke open when he bumped it on a portion of his hospital bed at home. There is fat exposed. The wound on his heel is a little bit smaller but still has a very pale surface. There is periwound callus accumulation. 03/30/2022: He had another small wound opened up on his abdomen. He says that he thinks these are starting as blisters. He has been on Mounjaro for weight loss and it looks like the loose skin resulting from losing weight might be rubbing and causing small friction injuries. The wound that I treated with chemical cauterization last visit is much smaller; the new wound has a very similar appearance to this. His heel ulcer is smaller with just a little bit of slough buildup on the surface. He continues to have fairly impressive periwound callus formation. 04/07/2022: Both abdominal wounds are smaller today and there is less hypertrophic granulation tissue protruding. The wound on his heel also continues to contract. There is just a layer of slough on the surface. No concern for infection. 04/29/2022: The previous 2 abdominal wounds have closed but he has a new 1 that appears identical to the previous lesions. The wound on his heel is smaller but has a thick layer of callus overlying it. 05/13/2022: His heel wound has healed. The wound on his abdomen  remains small but still has some hypertrophic granulation tissue present. He says it drains serous fluid from time to time. READMISSION 08/18/2022: [name] returns with reopening of the small wound on his abdomen. He said it closed and then he recently went swimming and later that day, he noted some serosanguineous drainage on his shirt. He has not had any fevers or chills. The drainage has never been purulent or foul-smelling. As per his previous admission, the wound is small and circular with fat exposed. No concern for infection. 12/14; the patient's original wound that he came in with has healed and is epithelialized however just below this he has another open area. He noticed this 3 days ago with a significant amount of sanguinous drainage. No pain no obvious purulence. 09/07/2022: The wound is shallower this week. No purulent drainage identified. The culture that was taken grew out just a small amount of Staph aureus pansensitive except to ciprofloxacin. No antibiotic  was prescribed by our clinic, but apparently the patient's primary care provider prescribed an antibiotic, but he does not recall the name and it is not available in the electronic medical record. 09/29/2022: Apparently the patient's home health providers have not been packing the wound. It now probes to a depth of 2.4 cm. It is clean without any slough or purulent drainage. No concern for infection. 10/24/2022: No real change to the wound. There is still about the same amount of depth. The orifice is narrow and this seems to be complicating appropriate packing of the site. 11/07/2022: The existing wound closed and the previous one reopened. It probes to 2.7 cm. No purulent drainage or malodor. 11/21/2022: The depth of the wound is a little bit less today. The opening to the wound is quite narrow. He has an appointment with [facility] surgery coming up on March 21. No concern for infection. 12/13/2022: He met with [facility]  bariatric surgery and they confirmed that these were sinus tracts coming from his Lap-Band port. They want to confirm that his band does not need to be removed in addition to the port before proceeding with surgical intervention. He is scheduled for an upper GI to evaluate this on Thursday. They are also waiting to upload the CT scan that he had performed in [place] so that they can evaluate the site of concern via imaging, as well. No significant changes to the wound itself. 12/27/2022: The sinus tract is a little bit shallower today, but really no significant changes. He had his upper GI and he is awaiting communication from the bariatric surgeon. Unfortunately the home health agency was not performing his wound care correctly and he dismissed them. 01/10/2023: No real change to the wound. I can still feel the reservoir for his Lap-Band when I probe the wound. No purulent drainage. He still has not heard anything from the bariatric surgeon. 02/28/2023: He returns after a substantial absence. He is scheduled to undergo surgery next week at [facility] to have his Lap-Band reservoir removed. The previous tunnel has closed and the wound has opened. No purulent drainage. No fevers or chills. Patient History Information obtained from Patient. Family History Cancer - Mother,Father, Diabetes - Mother,Father,Siblings, Heart Disease - Siblings, Lung Disease - Mother, No family history of Hereditary Spherocytosis, Hypertension, Kidney Disease, Seizures, Stroke, Thyroid Problems, Tuberculosis. Social History Never smoker, Marital Status - Single, Alcohol Use - Never, Drug Use - No History, Caffeine Use - Daily - Coffee. Medical History Eyes Denies history of Cataracts, Glaucoma, Optic Neuritis Ear/Nose/Mouth/Throat Denies history of Chronic sinus problems/congestion, Middle ear problems Hematologic/Lymphatic SATTLER, Herndon ([id number]) [id number] Page 6 of 9 Denies history of  Anemia, Hemophilia, Human Immunodeficiency Virus, Lymphedema, Sickle Cell Disease Respiratory Patient has history of Sleep Apnea Denies history of Aspiration, Asthma, Chronic Obstructive Pulmonary Disease (COPD), Pneumothorax, Tuberculosis Cardiovascular Patient has history of Congestive Heart Failure, Hypertension Denies history of Angina, Arrhythmia, Coronary Artery Disease, Deep Vein Thrombosis, Hypotension, Myocardial Infarction, Peripheral Arterial Disease, Peripheral Venous Disease, Phlebitis, Vasculitis Gastrointestinal Denies history of Cirrhosis , Colitis, Crohns, Hepatitis A, Hepatitis B, Hepatitis C Endocrine Patient has history of Type II Diabetes Denies history of Type I Diabetes Genitourinary Denies history of End Stage Renal Disease Immunological Denies history of Lupus Erythematosus, Raynauds, Scleroderma Integumentary (Skin) Denies history of History of Burn Musculoskeletal Patient has history of Osteoarthritis Denies history of Gout, Rheumatoid Arthritis, Osteomyelitis Neurologic Patient has history of Neuropathy - Diabetic Denies history of Dementia, Quadriplegia, Paraplegia, Seizure Disorder Oncologic Denies history  of Received Chemotherapy, Received Radiation Psychiatric Denies history of Anorexia/bulimia, Confinement Anxiety Objective Constitutional no acute distress. Vitals Time Taken: 8:01 AM, Height: 72 in, Weight: 400 lbs, BMI: 54.2, Temperature: 98.5 F, Pulse: 71 bpm, Respiratory Rate: 20 breaths/min, Blood Pressure: 120/73 mmHg, Capillary Blood Glucose: 171 mg/dl. General Notes: glucose per pt report this am Respiratory Normal work of breathing on room air. General Notes: The previous tunnel has closed and a new wound has opened. No purulent drainage. No fevers or chills. Integumentary (Hair, Skin) Wound #25 status is Open. Original cause of wound was Gradually Appeared. The date acquired was: 11/07/2022. The wound has been in treatment 16 weeks. The  wound is located on the Superior Abdomen - midline. The wound measures 0cm length x 0cm width x 0cm depth; 0cm^2 area and 0cm^3 volume. There is no tunneling or undermining noted. There is a none present amount of drainage noted. There is no granulation within the wound bed. There is no necrotic tissue within the wound bed. The periwound skin appearance had no abnormalities noted for texture. The periwound skin appearance had no abnormalities noted for moisture. The periwound skin appearance had no abnormalities noted for color. Periwound temperature was noted as No Abnormality. Wound #26 status is Open. Original cause of wound was Gradually Appeared. The date acquired was: 02/24/2023. The wound is located on the Right Abdomen - midline. The wound measures 0.5cm length x 0.9cm width x 4.6cm depth; 0.353cm^2 area and 1.626cm^3 volume. There is Fat Layer (Subcutaneous Tissue) exposed. There is no tunneling or undermining noted. There is a medium amount of serosanguineous drainage noted. The wound margin is distinct with the outline attached to the wound base. There is large (67-100%) red, hyper - granulation within the wound bed. There is no necrotic tissue within the wound bed. The periwound skin appearance had no abnormalities noted for texture. The periwound skin appearance had no abnormalities noted for moisture. The periwound skin appearance had no abnormalities noted for color. Periwound temperature was noted as No Abnormality. 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 - Change dressing every other day Home Health: No change in wound care orders this week; continue Home Health for wound care. May utilize formulary equivalent dressing for wound treatment orders unless otherwise specified. Dressing changes to be completed by Home Health on Monday / Wednesday / Friday except when patient has scheduled visit at [facility]. Other Home Health Orders/Instructions: - [facility] WOUND #26: - Abdomen - midline Wound Laterality: Right Cleanser: Soap and Water 3 x Per Week/30 Days Discharge Instructions: May shower and wash wound with dial antibacterial soap and water prior to dressing change. Cleanser: Wound Cleanser 3 x Per Week/30 Days Discharge Instructions: Cleanse the wound with wound cleanser prior to applying a clean dressing using gauze sponges, not tissue or cotton balls. Prim Dressing: Iodoform packing strip 1/4 (in) 3 x Per Week/30 Days ary Discharge Instructions: Lightly pack as instructed Secondary Dressing: Zetuvit Plus Silicone Border Dressing 4x4 (in/in) 3 x Per Week/30 Days Discharge Instructions: Apply silicone border over primary dressing as directed. 02/28/2023: The previous  tunnel has closed and a new wound has opened. No purulent drainage. No fevers or chills. No debridement was necessary today. We will pack this tunnel with iodoform packing strips, as we have done with the others in the past. He is going to undergo surgery at [facility] next week to remove his lap band port, which seems to be the etiology for all of these sinus tracts. If he needs our services after his surgery, he will contact us for an appointment. For now, follow-up as needed. Electronic Signature(s) Signed: 02/28/2023 8:45:50 AM By: Duanne Guess MD FACS Entered By: Duanne Guess on 02/28/2023  08:45:50 -------------------------------------------------------------------------------- HxROS Details Patient Name: Date of Service: [name], Reice 02/28/2023 8:15 A M Medical Record Number: [id number] Patient Account Number: [id number] Date of Birth/Sex: Treating RN: 07-07-1956 (67 y.o. M) Primary Care Provider: Debria Garret Other Clinician: Referring Provider: Treating Provider/Extender: Leeroy Bock in Treatment: 27 Information Obtained From Patient Eyes Medical History: Negative for: Cataracts; Glaucoma; Optic Neuritis Ear/Nose/Mouth/Throat Medical History: Negative for: Chronic sinus problems/congestion; Middle ear problems Hematologic/Lymphatic Medical History: Negative for: Anemia; Hemophilia; Human Immunodeficiency Virus; Lymphedema; Sickle Cell Disease Respiratory Medical History: Positive for: Sleep Apnea Negative for: Aspiration; Asthma; Chronic Obstructive Pulmonary Disease (COPD); Pneumothorax; Tuberculosis Cardiovascular Medical History: Positive for: Congestive Heart Failure; Hypertension Negative for: Angina; Arrhythmia; Coronary Artery Disease; Deep Vein Thrombosis; Hypotension; Myocardial Infarction; Peripheral Arterial Disease; Peripheral Venous Disease; Phlebitis; Vasculitis [name], [name] ([id number]) [id number] Page 8 of 9 Gastrointestinal Medical History: Negative for: Cirrhosis ; Colitis; Crohns; Hepatitis A; Hepatitis B; Hepatitis C Endocrine Medical History: Positive for: Type II Diabetes Negative for: Type I Diabetes Time with diabetes: 1994 Treated with: Insulin Blood sugar tested every day: No Genitourinary Medical History: Negative for: End Stage Renal Disease Immunological Medical History: Negative for: Lupus Erythematosus; Raynauds; Scleroderma Integumentary (Skin) Medical History: Negative for: History of Burn Musculoskeletal Medical History: Positive for:  Osteoarthritis Negative for: Gout; Rheumatoid Arthritis; Osteomyelitis Neurologic Medical History: Positive for: Neuropathy - Diabetic Negative for: Dementia; Quadriplegia; Paraplegia; Seizure Disorder Oncologic Medical History: Negative for: Received Chemotherapy; Received Radiation Psychiatric Medical History: Negative for: Anorexia/bulimia; Confinement Anxiety Immunizations Pneumococcal Vaccine: Received Pneumococcal Vaccination: No Implantable Devices No devices added Family and Social History Cancer: Yes - Mother,Father; Diabetes: Yes - Mother,Father,Siblings; Heart Disease: Yes - Siblings; Hereditary Spherocytosis: No; Hypertension: No; Kidney Disease: No; Lung Disease: Yes - Mother; Seizures: No; Stroke: No; Thyroid Problems: No; Tuberculosis: No; Never smoker; Marital Status - Single; Alcohol Use: Never; Drug Use: No History; Caffeine Use: Daily - Coffee; Financial Concerns: No; Food, Clothing or Shelter Needs: No; Support System Lacking: No; Transportation Concerns: No Electronic Signature(s) Signed: 02/28/2023 8:46:38 AM By: Duanne Guess MD FACS Entered By: Duanne Guess on 02/28/2023 08:43:07 -------------------------------------------------------------------------------- SuperBill Details Patient Name: Date of Service: [name], Khalel 02/28/2023 Medical Record Number: [id number] Patient Account Number: [id number] Date of Birth/Sex: Treating RN: [birth year] (67 y.o. Riaan, [place], Shivam ([id number]) [id number].pdf Page 9 of 9 Primary Care Provider: Debria Garret Other Clinician: Referring Provider: Treating Provider/Extender: Leeroy Bock in Treatment: 27 Diagnosis Coding ICD-10 Codes Code Description L98.499 Non-pressure chronic ulcer of skin of other sites with unspecified severity E66.2 Morbid (severe) obesity with alveolar hypoventilation I73.9 Peripheral vascular disease, unspecified I50.32  Chronic diastolic (congestive) heart failure E11.622 Type 2 diabetes mellitus with other skin ulcer Facility Procedures : CPT4 Code: [id number] Description: 33295 - WOUND CARE VISIT-LEV 3 EST PT Modifier: Quantity: 1 Physician Procedures : CPT4 Code Description Modifier [id number] [phone] -  WC PHYS LEVEL 4 - EST PT ICD-10 Diagnosis Description L98.499 Non-pressure chronic ulcer of skin of other sites with unspecified severity E11.622 Type 2 diabetes mellitus with other skin ulcer E66.2 Morbid  (severe) obesity with alveolar hypoventilation I50.32 Chronic diastolic (congestive) heart failure Quantity: 1 Electronic Signature(s) Signed: 02/28/2023 8:46:07 AM By: Duanne Guess MD FACS Entered By: Duanne Guess on 02/28/2023 08:46:06

## 2023-03-01 NOTE — Progress Notes (Signed)
[name], Jaivian ([id number]) [id number] Page 1 of 8 Visit Report for 02/28/2023 Arrival Information Details Patient Name: Date of Service: KANON, RAUSCH 02/28/2023 8:15 A M Medical Record Number: [id number] Patient Account Number: [id number] Date of Birth/Sex: Treating RN: December 05, 1955 (67 y.o. M) Primary Care Ireta Pullman: Debria Garret Other Clinician: Referring Danijela Vessey: Treating Aloni Chuang/Extender: Leeroy Bock in Treatment: 27 Visit Information History Since Last Visit All ordered tests and consults were completed: No Patient Arrived: Wheel Chair Added or deleted any medications: No Arrival Time: 08:01 Any new allergies or adverse reactions: No Accompanied By: self Had a fall or experienced change in No Transfer Assistance: None activities of daily living that may affect Patient Identification Verified: Yes risk of falls: Secondary Verification Process Completed: Yes Signs or symptoms of abuse/neglect since last visito No Hospitalized since last visit: No Implantable device outside of the clinic excluding No cellular tissue based products placed in the center since last visit: Has Dressing in Place as Prescribed: Yes Pain Present Now: No Electronic Signature(s) Signed: 02/28/2023 5:04:24 PM By: Zenaida Deed RN, BSN Entered By: Zenaida Deed on 02/28/2023 08:13:04 -------------------------------------------------------------------------------- Clinic Level of Care Assessment Details Patient Name: Date of Service: NICHOL, EMMEL 02/28/2023 8:15 A M Medical Record Number: [id number] Patient Account Number: [id number] Date of Birth/Sex: Treating RN: Jun 20, 1956 (67 y.o. [name] Primary Care Danae Oland: Debria Garret Other Clinician: Referring Ravi Tuccillo: Treating Abra Lingenfelter/Extender: Leeroy Bock in Treatment: 27 Clinic Level of Care Assessment Items TOOL 4 Quantity Score []  - 0 Use when only  an EandM is performed on FOLLOW-UP visit ASSESSMENTS - Nursing Assessment / Reassessment X- 1 10 Reassessment of Co-morbidities (includes updates in patient status) X- 1 5 Reassessment of Adherence to Treatment Plan ASSESSMENTS - Wound and Skin A ssessment / Reassessment []  - 0 Simple Wound Assessment / Reassessment - one wound X- 2 5 Complex Wound Assessment / Reassessment - multiple wounds []  - 0 Dermatologic / Skin Assessment (not related to wound area) ASSESSMENTS - Focused Assessment []  - 0 Circumferential Edema Measurements - multi extremities []  - 0 Nutritional Assessment / Counseling / Intervention []  - 0 Lower Extremity Assessment (monofilament, tuning fork, pulses) []  - 0 Peripheral Arterial Disease Assessment (using hand held doppler) ASSESSMENTS - Ostomy and/or Continence Assessment and Care []  - 0 Incontinence Assessment and Management []  - 0 Ostomy Care Assessment and Management (repouching, etc.) PROCESS - Coordination of Care [place], Rayvion ([id number]) [id number] Page 2 of 8 X- 1 15 Simple Patient / Family Education for ongoing care []  - 0 Complex (extensive) Patient / Family Education for ongoing care X- 1 10 Staff obtains Chiropractor, Records, T Results / Process Orders est X- 1 10 Staff telephones HHA, Nursing Homes / Clarify orders / etc []  - 0 Routine Transfer to another Facility (non-emergent condition) []  - 0 Routine Hospital Admission (non-emergent condition) []  - 0 New Admissions / Manufacturing engineer / Ordering NPWT Apligraf, etc. , []  - 0 Emergency Hospital Admission (emergent condition) X- 1 10 Simple Discharge Coordination []  - 0 Complex (extensive) Discharge Coordination PROCESS - Special Needs []  - 0 Pediatric / Minor Patient Management []  - 0 Isolation Patient Management []  - 0 Hearing / Language / Visual special needs []  - 0 Assessment of Community assistance (transportation, D/C planning,  etc.) []  - 0 Additional assistance / Altered mentation []  - 0 Support Surface(s) Assessment (bed, cushion, seat, etc.) INTERVENTIONS - Wound Cleansing / Measurement []  - 0 Simple Wound Cleansing - one wound X- 2  5 Complex Wound Cleansing - multiple wounds X- 1 5 Wound Imaging (photographs - any number of wounds) []  - 0 Wound Tracing (instead of photographs) X- 1 5 Simple Wound Measurement - one wound []  - 0 Complex Wound Measurement - multiple wounds INTERVENTIONS - Wound Dressings X - Small Wound Dressing one or multiple wounds 1 10 []  - 0 Medium Wound Dressing one or multiple wounds []  - 0 Large Wound Dressing one or multiple wounds []  - 0 Application of Medications - topical []  - 0 Application of Medications - injection INTERVENTIONS - Miscellaneous []  - 0 External ear exam []  - 0 Specimen Collection (cultures, biopsies, blood, body fluids, etc.) []  - 0 Specimen(s) / Culture(s) sent or taken to Lab for analysis []  - 0 Patient Transfer (multiple staff / Nurse, adult / Similar devices) []  - 0 Simple Staple / Suture removal (25 or less) []  - 0 Complex Staple / Suture removal (26 or more) []  - 0 Hypo / Hyperglycemic Management (close monitor of Blood Glucose) []  - 0 Ankle / Brachial Index (ABI) - do not check if billed separately X- 1 5 Vital Signs Has the patient been seen at the hospital within the last three years: Yes Total Score: 105 Level Of Care: New/Established - Level 3 Electronic Signature(s) Signed: 02/28/2023 5:04:24 PM By: Zenaida Deed RN, BSN [place], [place] ([id number]) [phone].pdf Page 3 of 8 Entered By: Zenaida Deed on 02/28/2023 08:43:54 -------------------------------------------------------------------------------- Encounter Discharge Information Details Patient Name: Date of Service: DASHTON, CALDARELLI 02/28/2023 8:15 A M Medical Record Number: [id number] Patient Account Number: [id number] Date of Birth/Sex: Treating  RN: 1955/11/10 (67 y.o. [name] Primary Care Elvin Banker: Debria Garret Other Clinician: Referring Briggette Najarian: Treating Doran Nestle/Extender: Leeroy Bock in Treatment: 27 Encounter Discharge Information Items Discharge Condition: Stable Ambulatory Status: Wheelchair Discharge Destination: Home Transportation: Other Accompanied By: self Schedule Follow-up Appointment: No Clinical Summary of Care: Patient Declined Notes transportation service Electronic Signature(s) Signed: 02/28/2023 5:04:24 PM By: Zenaida Deed RN, BSN Entered By: Zenaida Deed on 02/28/2023 08:44:49 -------------------------------------------------------------------------------- Lower Extremity Assessment Details Patient Name: Date of Service: THERMAN, TREBING 02/28/2023 8:15 A M Medical Record Number: [id number] Patient Account Number: [id number] Date of Birth/Sex: Treating RN: 03/02/1956 (67 y.o. [name] Primary Care Casen Pryor: Debria Garret Other Clinician: Referring Jayke Caul: Treating Sahily Biddle/Extender: Kathleen Lime Weeks in Treatment: 27 Electronic Signature(s) Signed: 02/28/2023 5:04:24 PM By: Zenaida Deed RN, BSN Entered By: Zenaida Deed on 02/28/2023 08:14:12 -------------------------------------------------------------------------------- Multi Wound Chart Details Patient Name: Date of Service: Herbert Moors, Labrandon 02/28/2023 8:15 A M Medical Record Number: [id number] Patient Account Number: [id number] Date of Birth/Sex: Treating RN: 1956/02/22 (67 y.o. M) Primary Care Kourtnei Rauber: Debria Garret Other Clinician: Referring Rahmon Heigl: Treating Latysha Thackston/Extender: Leeroy Bock in Treatment: 27 Vital Signs Height(in): 72 Capillary Blood Glucose(mg/dl): 595 Weight(lbs): 638 Pulse(bpm): 71 Body Mass Index(BMI): 54.2 Blood Pressure(mmHg): 120/73 Temperature(F): 98.5 Respiratory Rate(breaths/min): 20 [25:Photos:]  [N/A:N/A [id number] Page 4 of 8] Superior Abdomen - midline Right Abdomen - midline N/A Wound Location: Gradually Appeared Gradually Appeared N/A Wounding Event: Atypical Infection - not elsewhere classified N/A Primary Etiology: Sleep Apnea, Congestive Heart Sleep Apnea, Congestive Heart N/A Comorbid History: Failure, Hypertension, Type II Failure, Hypertension, Type II Diabetes, Osteoarthritis, Neuropathy Diabetes, Osteoarthritis, Neuropathy 11/07/2022 02/24/2023 N/A Date Acquired: 16 0 N/A Weeks of Treatment: Open Open N/A Wound Status: No No N/A Wound Recurrence: 0x0x0 0.5x0.9x4.6 N/A Measurements L x W x D (cm) 0 0.353 N/A A (cm) : rea 0 1.626  N/A Volume (cm) : 100.00% N/A N/A % Reduction in Area: 100.00% N/A N/A % Reduction in Volume: Full Thickness Without Exposed Full Thickness With Exposed Support N/A Classification: Support Structures Structures None Present Medium N/A Exudate Amount: N/A Serosanguineous N/A Exudate Type: N/A red, brown N/A Exudate Color: N/A Distinct, outline attached N/A Wound Margin: None Present (0%) Large (67-100%) N/A Granulation Amount: N/A Red, Hyper-granulation N/A Granulation Quality: None Present (0%) None Present (0%) N/A Necrotic Amount: Fascia: No Fat Layer (Subcutaneous Tissue): Yes N/A Exposed Structures: Fat Layer (Subcutaneous Tissue): No Fascia: No Tendon: No Tendon: No Muscle: No Muscle: No Joint: No Joint: No Bone: No Bone: No Large (67-100%) None N/A Epithelialization: No Abnormalities Noted No Abnormalities Noted N/A Periwound Skin Texture: No Abnormalities Noted No Abnormalities Noted N/A Periwound Skin Moisture: No Abnormalities Noted No Abnormalities Noted N/A Periwound Skin Color: No Abnormality No Abnormality N/A Temperature: N/A probes to lap port N/A Assessment Notes: Treatment Notes Electronic Signature(s) Signed: 02/28/2023 8:38:40 AM By: Duanne Guess MD  FACS Entered By: Duanne Guess on 02/28/2023 08:38:40 -------------------------------------------------------------------------------- Multi-Disciplinary Care Plan Details Patient Name: Date of Service: Herbert Moors, Jamire 02/28/2023 8:15 A M Medical Record Number: [id number] Patient Account Number: [id number] Date of Birth/Sex: Treating RN: [birth year] (67 y.o. [name] Primary Care Erwin Nishiyama: Debria Garret Other Clinician: Referring Adeline Petitfrere: Treating Aleysha Meckler/Extender: Leeroy Bock in Treatment: 27 Multidisciplinary Care Plan reviewed with physician Active Inactive Electronic Signature(s) Signed: 02/28/2023 5:04:24 PM By: Zenaida Deed RN, BSN Entered By: Zenaida Deed on 02/28/2023 08:47:37 -------------------------------------------------------------------------------- Pain Assessment Details Patient Name: Date of Service: MACEN, JOSLIN 02/28/2023 8:15 A M Medical Record Number: [id number] Patient Account Number: [id number] Date of Birth/Sex: Treating RN: [birth year] (67 y.o. M) Primary Care Oberia Beaudoin: Debria Garret Other Clinician: Janora Norlander ([id number]) [id number] Page 5 of 8 Referring Antonin Meininger: Treating Amonie Wisser/Extender: Leeroy Bock in Treatment: 27 Active Problems Location of Pain Severity and Description of Pain Patient Has Paino No Site Locations Pain Management and Medication Current Pain Management: Electronic Signature(s) Signed: 02/28/2023 5:04:24 PM By: Zenaida Deed RN, BSN Entered By: Zenaida Deed on 02/28/2023 08:14:05 -------------------------------------------------------------------------------- Patient/Caregiver Education Details Patient Name: Date of Service: Herbert Moors, Sascha 6/11/2024andnbsp8:15 A M Medical Record Number: [id number] Patient Account Number: [id number] Date of Birth/Gender: Treating RN: 08/25/1956 (67 y.o. [name] Primary Care  Physician: Debria Garret Other Clinician: Referring Physician: Treating Physician/Extender: Leeroy Bock in Treatment: 27 Education Assessment Education Provided To: Patient Education Topics Provided Elevated Blood Sugar/ Impact on Healing: Methods: Explain/Verbal Responses: Reinforcements needed, State content correctly Wound/Skin Impairment: Methods: Explain/Verbal Responses: Reinforcements needed, State content correctly Electronic Signature(s) Signed: 02/28/2023 5:04:24 PM By: Zenaida Deed RN, BSN Entered By: Zenaida Deed on 02/28/2023 08:28:06 -------------------------------------------------------------------------------- Wound Assessment Details Patient Name: Date of Service: Herbert Moors, Derrico 02/28/2023 8:15 A M Medical Record Number: [id number] Patient Account Number: [id number] DEL, OVERFELT ([id number]) [id number].pdf Page 6 of 8 Date of Birth/Sex: Treating RN: 1956/04/09 (67 y.o. [name] Primary Care Shemia Bevel: Other Clinician: Debria Garret Referring Sharell Hilmer: Treating Danamarie Minami/Extender: Kathleen Lime Weeks in Treatment: 27 Wound Status Wound Number: 25 Primary Atypical Etiology: Wound Location: Superior Abdomen - midline Wound Open Wounding Event: Gradually Appeared Status: Date Acquired: 11/07/2022 Comorbid Sleep Apnea, Congestive Heart Failure, Hypertension, Type II Weeks Of Treatment: 16 History: Diabetes, Osteoarthritis, Neuropathy Clustered Wound: No Photos Wound Measurements Length: (cm) Width: (cm) Depth: (cm) Area: (cm) Volume: (cm) 0 % Reduction in Area: 100% 0 % Reduction in Volume: 100%  0 Epithelialization: Large (67-100%) 0 Tunneling: No 0 Undermining: No Wound Description Classification: Full Thickness Without Exposed Support Structures Exudate Amount: None Present Foul Odor After Cleansing: No Slough/Fibrino No Wound Bed Granulation Amount: None  Present (0%) Exposed Structure Necrotic Amount: None Present (0%) Fascia Exposed: No Fat Layer (Subcutaneous Tissue) Exposed: No Tendon Exposed: No Muscle Exposed: No Joint Exposed: No Bone Exposed: No Periwound Skin Texture Texture Color No Abnormalities Noted: Yes No Abnormalities Noted: Yes Moisture Temperature / Pain No Abnormalities Noted: Yes Temperature: No Abnormality Electronic Signature(s) Signed: 02/28/2023 5:04:24 PM By: Zenaida Deed RN, BSN Entered By: Zenaida Deed on 02/28/2023 08:18:06 -------------------------------------------------------------------------------- Wound Assessment Details Patient Name: Date of Service: Herbert Moors, Kyel 02/28/2023 8:15 A M Medical Record Number: [id number] Patient Account Number: [id number] Date of Birth/Sex: Treating RN: Jun 01, 1956 (67 y.o. [name] Primary Care Devesh Monforte: Debria Garret Other Clinician: Referring Adelaide Pfefferkorn: Treating Leelah Hanna/Extender: Kathleen Lime Weeks in Treatment: 27 Wound Status Wound Number: 26 Primary Infection - not elsewhere classified Etiology: AMEER, CABALLEROS ([id number]) [id number] Page 7 of 8 Etiology: Wound Location: Right Abdomen - midline Wound Open Wounding Event: Gradually Appeared Status: Date Acquired: 02/24/2023 Comorbid Sleep Apnea, Congestive Heart Failure, Hypertension, Type II Weeks Of Treatment: 0 History: Diabetes, Osteoarthritis, Neuropathy Clustered Wound: No Photos Wound Measurements Length: (cm) 0.5 Width: (cm) 0.9 Depth: (cm) 4.6 Area: (cm) 0.353 Volume: (cm) 1.626 % Reduction in Area: % Reduction in Volume: Epithelialization: None Tunneling: No Undermining: No Wound Description Classification: Full Thickness With Exposed Suppo Wound Margin: Distinct, outline attached Exudate Amount: Medium Exudate Type: Serosanguineous Exudate Color: red, brown rt Structures Foul Odor After Cleansing: No Wound  Bed Granulation Amount: Large (67-100%) Exposed Structure Granulation Quality: Red, Hyper-granulation Fascia Exposed: No Necrotic Amount: None Present (0%) Fat Layer (Subcutaneous Tissue) Exposed: Yes Tendon Exposed: No Muscle Exposed: No Joint Exposed: No Bone Exposed: No Periwound Skin Texture Texture Color No Abnormalities Noted: Yes No Abnormalities Noted: Yes Moisture Temperature / Pain No Abnormalities Noted: Yes Temperature: No Abnormality Assessment Notes probes to lap port Electronic Signature(s) Signed: 02/28/2023 5:04:24 PM By: Zenaida Deed RN, BSN Entered By: Zenaida Deed on 02/28/2023 08:25:05 -------------------------------------------------------------------------------- Vitals Details Patient Name: Date of Service: Herbert Moors, Mitul 02/28/2023 8:15 A M Medical Record Number: [id number] Patient Account Number: [id number] Date of Birth/Sex: Treating RN: 05/27/1956 (67 y.o. M) Primary Care Solyana Nonaka: Debria Garret Other Clinician: Referring Analisia Kingsford: Treating Leela Vanbrocklin/Extender: Leeroy Bock in Treatment: 27 Vital Signs Time Taken: 08:01 Temperature (F): 98.5 Height (in): 72 Pulse (bpm): 71 Weight (lbs): 400 Respiratory Rate (breaths/min): 20 [place], Kayven ([id number]) [id number] Page 8 of 8 Weight (lbs): 400 Respiratory Rate (breaths/min): 20 Body Mass Index (BMI): 54.2 Blood Pressure (mmHg): 120/73 Capillary Blood Glucose (mg/dl): 595 Reference Range: 80 - 120 mg / dl Notes glucose per pt report this am Electronic Signature(s) Signed: 02/28/2023 5:04:24 PM By: Zenaida Deed RN, BSN Entered By: Zenaida Deed on 02/28/2023 08:13:26

## 2023-03-03 ENCOUNTER — Telehealth: Payer: Self-pay | Admitting: Podiatry

## 2023-03-05 NOTE — Progress Notes (Signed)
  Subjective:  Patient ID: [name], male    DOB: July 09, 1956,  MRN: [id number]  [name] presents to clinic today for at risk foot care with history of diabetic neuropathy and corn(s) right lower extremity, callus(es) left lower extremity and painful mycotic nails.  Pain interferes with ambulation. Aggravating factors include wearing enclosed shoe gear. Painful toenails interfere with ambulation. Aggravating factors include wearing enclosed shoe gear. Pain is relieved with periodic professional debridement. Painful corns and calluses are aggravated when weightbearing with and without shoegear. Pain is relieved with periodic professional debridement.  Chief Complaint  Patient presents with   diabitic foot care    New problem(s): None.   He is also here to pick up diabetic shoes today.  PCP is Estevan Oaks, NP.  Allergies  Allergen Reactions   Shellfish Allergy Anaphylaxis   Lisinopril Cough   Relistor [Methylnaltrexone Bromide] Other (See Comments)    confusion   Lovastatin Itching    Review of Systems: Negative except as noted in the HPI.  Objective: No changes noted in today's physical examination. Vitals:   02/28/23 1034  BP: (!) 121/57  Pulse: 64  Temp: 97.6 F (36.4 C)  SpO2: 94%   [name] is a pleasant 67 y.o. male morbidly obese in NAD. AAO x 3.  Vascular Examination: Capillary refill time immediate b/l. Vascular status intact b/l with palpable pedal pulses. Pedal hair absent b/l. No pain with calf compression b/l. Skin temperature gradient WNL b/l. Nonpitting edema noted BLE.  Neurological Examination: Pt has subjective symptoms of neuropathy. Protective sensation diminished with 10g monofilament b/l. Vibratory sensation diminished b/l.  Dermatological Examination: Pedal skin with normal turgor, texture and tone b/l.  No open wounds. No interdigital macerations.   Toenails 1-5 b/l thick, discolored, elongated with subungual debris and pain on  dorsal palpation.   Hyperkeratotic lesion(s) left great toe, L 2nd toe, R 3rd toe, and 1st metatarsal head left foot.  No erythema, no edema, no drainage, no fluctuance.  Musculoskeletal Examination: Muscle strength 5/5 to all lower extremity muscle groups bilaterally. Pes planus deformity noted bilateral LE. Utilizes motorized chair for mobility assistance.  Assessment/Plan: 1. Pain due to onychomycosis of toenails of both feet   2. Corns and callosities   3. Diabetic peripheral neuropathy (HCC)   -Patient was evaluated and treated. All patient's and/or POA's questions/concerns answered on today's visit. -Diabetic shoes did not fit on today's visit. New size will be ordered and patient will be contacted for try-on when shoes arrive. -Toenails 1-5 b/l were debrided in length and girth with sterile nail nippers and dremel without iatrogenic bleeding.  -Corn(s) L 2nd toe and R 3rd toe pared utilizing sterile scalpel blade without complication or incident. Total number debrided=2. -Callus(es) 1st metatarsal head left lower extremity pared utilizing sterile scalpel blade without complication or incident. Total number debrided =1. -Patient/POA to call should there be question/concern in the interim.   Return in about 3 months (around 05/31/2023).  Freddie Breech, DPM

## 2023-03-06 ENCOUNTER — Telehealth (HOSPITAL_COMMUNITY): Payer: Self-pay | Admitting: Cardiology

## 2023-03-06 NOTE — Telephone Encounter (Signed)
Medical clearance completed by Mikki Santee Patient is approved to proceed with procedure lap band removal under MAC anesthesia from a cardiac standpoint on 03/07/23 MODERATE RISK Medication prep-none *per Shanda Bumps [place]-discussed with Mardella Layman [place],NP moderate risk from HF standpoint but not prohibitive.   Faxed to [facility] Pre Anesthesia Testing [phone]

## 2023-03-07 NOTE — Telephone Encounter (Signed)
Lmom for pt to call back to schedule picking up diabetic shoes   

## 2023-03-14 ENCOUNTER — Ambulatory Visit (HOSPITAL_BASED_OUTPATIENT_CLINIC_OR_DEPARTMENT_OTHER): Payer: 59 | Admitting: General Surgery

## 2023-03-16 ENCOUNTER — Other Ambulatory Visit (HOSPITAL_COMMUNITY): Payer: Self-pay | Admitting: Cardiology

## 2023-03-16 MED ORDER — EMPAGLIFLOZIN 10 MG PO TABS: 10.0000 mg | ORAL_TABLET | Freq: Every day | ORAL | 3 refills | Status: AC

## 2023-03-17 ENCOUNTER — Other Ambulatory Visit: Payer: 59

## 2023-03-24 ENCOUNTER — Ambulatory Visit (INDEPENDENT_AMBULATORY_CARE_PROVIDER_SITE_OTHER): Payer: 59 | Admitting: Podiatry

## 2023-03-24 DIAGNOSIS — E1142 Type 2 diabetes mellitus with diabetic polyneuropathy: Secondary | ICD-10-CM

## 2023-03-24 DIAGNOSIS — M2142 Flat foot [pes planus] (acquired), left foot: Secondary | ICD-10-CM | POA: Diagnosis not present

## 2023-03-24 DIAGNOSIS — L84 Corns and callosities: Secondary | ICD-10-CM | POA: Diagnosis not present

## 2023-03-24 DIAGNOSIS — M2141 Flat foot [pes planus] (acquired), right foot: Secondary | ICD-10-CM

## 2023-03-24 NOTE — Progress Notes (Signed)
Patient presents today to pick up diabetic shoes and insoles.  Patient was dispensed 1 pair of diabetic shoes and 3 pairs of foam casted diabetic insoles. Fit was satisfactory. Instructions for break-in and wear was reviewed and a copy was given to the patient.   Re-appointment for regularly scheduled diabetic foot care visits or if they should experience any trouble with the shoes or insoles.  

## 2023-04-11 ENCOUNTER — Other Ambulatory Visit (HOSPITAL_COMMUNITY): Payer: Self-pay | Admitting: Cardiology

## 2023-05-06 LAB — LAB REPORT - SCANNED: EGFR: 29

## 2023-05-11 ENCOUNTER — Encounter: Payer: Self-pay | Admitting: Nephrology

## 2023-05-12 ENCOUNTER — Encounter: Payer: Self-pay | Admitting: Podiatry

## 2023-05-12 ENCOUNTER — Ambulatory Visit: Payer: 59 | Admitting: Podiatry

## 2023-05-12 DIAGNOSIS — B351 Tinea unguium: Secondary | ICD-10-CM

## 2023-05-12 DIAGNOSIS — M79675 Pain in left toe(s): Secondary | ICD-10-CM | POA: Diagnosis not present

## 2023-05-12 DIAGNOSIS — E1142 Type 2 diabetes mellitus with diabetic polyneuropathy: Secondary | ICD-10-CM

## 2023-05-12 DIAGNOSIS — M79674 Pain in right toe(s): Secondary | ICD-10-CM | POA: Diagnosis not present

## 2023-05-12 DIAGNOSIS — L84 Corns and callosities: Secondary | ICD-10-CM | POA: Diagnosis not present

## 2023-05-12 NOTE — Progress Notes (Signed)
  Subjective:  Patient ID: [name], male    DOB: 26-Mar-1956,  MRN: [id number]  Chief Complaint  Patient presents with   Diabetes    Patient is here for a follow up for chronic conditions for [facility] A1C 9.1(02/27/2023)    67 y.o. male presents with the above complaint. History confirmed with patient. Patient presenting with pain related to dystrophic thickened elongated nails. Patient is unable to trim own nails related to nail dystrophy and/or mobility issues. Patient does  have a history of T2DM.  Patient also has painful calluses present preulcerative lesions present on both feet.  Objective:  Physical Exam: warm, good capillary refill nail exam onychomycosis of the toenails, onycholysis, and dystrophic nails DP pulses palpable, PT pulses palpable, and protective sensation absent Left Foot:  Pain with palpation of nails due to elongation and dystrophic growth.  Hyperkeratotic lesion medial aspect of the hallux MPJ. Right Foot: Pain with palpation of nails due to elongation and dystrophic growth.  Hyperkeratotic lesion dorsal and distal tuft of the right hallux as well as the distal right second toe  Assessment:   1. Diabetic peripheral neuropathy associated with type 2 diabetes mellitus (HCC)   2. Corns and callosities   3. Pain due to onychomycosis of toenails of both feet      Plan:  Patient was evaluated and treated and all questions answered.  #Hyperkeratotic lesions/pre ulcerative calluses present dorsal hallux IPJ R, distal tuft right hallux as well as medial aspect left hallux, right 2nd toe All symptomatic hyperkeratoses x 4 separate lesions were safely debrided with a sterile #10 blade to patient's level of comfort without incident. We discussed preventative and palliative care of these lesions including supportive and accommodative shoegear, padding, prefabricated and custom molded accommodative orthoses, use of a pumice stone and lotions/creams daily.  #Onychomycosis  with pain  -Nails palliatively debrided as below. -Educated on self-care  Procedure: Nail Debridement Rationale: Pain Type of Debridement: manual, sharp debridement. Instrumentation: Nail nipper, rotary burr. Number of Nails: 10  Return in about 3 months (around 08/12/2023) for [facility].         Corinna Gab, DPM [facility]

## 2023-05-18 NOTE — Telephone Encounter (Addendum)
Per [employer] (Kayla-answering service) patient called in 05/16/23 at 3:55 to say he did not want the machine and that was surviving just fine without it.

## 2023-06-05 ENCOUNTER — Ambulatory Visit: Payer: 59 | Admitting: Podiatry

## 2023-06-27 ENCOUNTER — Encounter (HOSPITAL_BASED_OUTPATIENT_CLINIC_OR_DEPARTMENT_OTHER): Payer: 59 | Attending: General Surgery | Admitting: General Surgery

## 2023-06-27 DIAGNOSIS — N186 End stage renal disease: Secondary | ICD-10-CM | POA: Insufficient documentation

## 2023-06-27 DIAGNOSIS — M1990 Unspecified osteoarthritis, unspecified site: Secondary | ICD-10-CM | POA: Diagnosis not present

## 2023-06-27 DIAGNOSIS — I5032 Chronic diastolic (congestive) heart failure: Secondary | ICD-10-CM | POA: Diagnosis not present

## 2023-06-27 DIAGNOSIS — L97422 Non-pressure chronic ulcer of left heel and midfoot with fat layer exposed: Secondary | ICD-10-CM | POA: Diagnosis not present

## 2023-06-27 DIAGNOSIS — D509 Iron deficiency anemia, unspecified: Secondary | ICD-10-CM | POA: Insufficient documentation

## 2023-06-27 DIAGNOSIS — L98492 Non-pressure chronic ulcer of skin of other sites with fat layer exposed: Secondary | ICD-10-CM | POA: Diagnosis not present

## 2023-06-27 DIAGNOSIS — Z992 Dependence on renal dialysis: Secondary | ICD-10-CM | POA: Insufficient documentation

## 2023-06-27 DIAGNOSIS — G4733 Obstructive sleep apnea (adult) (pediatric): Secondary | ICD-10-CM | POA: Insufficient documentation

## 2023-06-27 DIAGNOSIS — Z7985 Long-term (current) use of injectable non-insulin antidiabetic drugs: Secondary | ICD-10-CM | POA: Insufficient documentation

## 2023-06-27 DIAGNOSIS — I132 Hypertensive heart and chronic kidney disease with heart failure and with stage 5 chronic kidney disease, or end stage renal disease: Secondary | ICD-10-CM | POA: Insufficient documentation

## 2023-06-27 DIAGNOSIS — E1122 Type 2 diabetes mellitus with diabetic chronic kidney disease: Secondary | ICD-10-CM | POA: Insufficient documentation

## 2023-06-27 DIAGNOSIS — E11622 Type 2 diabetes mellitus with other skin ulcer: Secondary | ICD-10-CM | POA: Diagnosis present

## 2023-06-27 DIAGNOSIS — E1140 Type 2 diabetes mellitus with diabetic neuropathy, unspecified: Secondary | ICD-10-CM | POA: Insufficient documentation

## 2023-06-27 NOTE — Progress Notes (Addendum)
Disease, Phlebitis, Vasculitis Gastrointestinal Denies history of Cirrhosis , Colitis, Crohns, Hepatitis A, Hepatitis B, Hepatitis C Endocrine Patient has history of Type II Diabetes Denies history of Type I Diabetes Genitourinary Denies history of End Stage Renal Disease Immunological Denies history of Lupus Erythematosus, Raynauds, Scleroderma Integumentary (Skin) Denies history of History of Burn Musculoskeletal Patient has history of Osteoarthritis Denies history of Gout, Rheumatoid Arthritis, Osteomyelitis Neurologic Patient has history of Neuropathy - Diabetic Denies history of Dementia, Quadriplegia, Paraplegia, Seizure Disorder Oncologic Denies history of Received Chemotherapy, Received Radiation Psychiatric Denies history of Anorexia/bulimia, Confinement Anxiety Hospitalization/Surgery History - Incisionand Drainage Abscess Complicated/Multiple gastric port.. - Incisional Drainage Hernia. Objective Constitutional No acute distress. Vitals Time Taken: 8:17 AM, Height: 72 in, Weight: 400 lbs, BMI: 54.2, Temperature: 98.1 F, Pulse: 68 bpm, Respiratory Rate: 18 breaths/min, Blood Pressure: 106/60 mmHg. Respiratory Normal work of breathing on room air. General  Notes: 06/27/2023: He has a well-healed scar on his abdomen where his Lap-Band port was removed. No open wounds present today. Assessment Active Problems ICD-10 Non-pressure chronic ulcer of skin of other sites with fat layer exposed Morbid (severe) obesity due to excess calories Type 2 diabetes mellitus with other skin ulcer [name], [name] ([id number]) [id number] Page 7 of 9 Plan Discharge From [facility] Services: Discharge from Wound Care Center - Congratulations!!! 06/27/2023: He returns to clinic after undergoing excision of his Lap-Band port with the accompanying fibrous capsule. He has a well-healed scar on his abdomen where his Lap-Band port was removed. No open wounds present today. He has no need of the wound care center at this time. We will discharge him and he may return in the future if needed. Electronic Signature(s) Signed: 06/27/2023 8:41:16 AM By: [name] Guess MD FACS Entered By: [name] on 06/27/2023 08:41:16 -------------------------------------------------------------------------------- HxROS Details Patient Name: Date of Service: [name], [name] 06/27/2023 8:00 A M Medical Record Number: [id number] Patient Account Number: [id number] Date of Birth/Sex: Treating RN: [birth year] (67 y.o. [name] Primary Care Provider: Debria [name] Other Clinician: Referring Provider: Treating Provider/Extender: [name] in Treatment: 0 Information Obtained From Patient Eyes Medical History: Negative for: Cataracts; Glaucoma; Optic Neuritis Ear/Nose/Mouth/Throat Medical History: Negative for: Chronic sinus problems/congestion; Middle ear problems Hematologic/Lymphatic Medical History: Negative for: Anemia; Hemophilia; Human Immunodeficiency Virus; Lymphedema; Sickle Cell Disease Respiratory Medical History: Positive for: Sleep Apnea Negative for: Aspiration; Asthma; Chronic Obstructive Pulmonary Disease  (COPD); Pneumothorax; Tuberculosis Cardiovascular Medical History: Positive for: Congestive Heart Failure; Hypertension Negative for: Angina; Arrhythmia; Coronary Artery Disease; Deep Vein Thrombosis; Hypotension; Myocardial Infarction; Peripheral Arterial Disease; Peripheral Venous Disease; Phlebitis; Vasculitis Gastrointestinal Medical History: Negative for: Cirrhosis ; Colitis; Crohns; Hepatitis A; Hepatitis B; Hepatitis C Endocrine [name] Thomas, [name] ([id number]) [id number] Page 8 of 9 Medical History: Positive for: Type II Diabetes Negative for: Type I Diabetes Time with diabetes: 1994 Treated with: Insulin Blood sugar tested every day: No Genitourinary Medical History: Negative for: End Stage Renal Disease Immunological Medical History: Negative for: Lupus Erythematosus; Raynauds; Scleroderma Integumentary (Skin) Medical History: Negative for: History of Burn Musculoskeletal Medical History: Positive for: Osteoarthritis Negative for: Gout; Rheumatoid Arthritis; Osteomyelitis Neurologic Medical History: Positive for: Neuropathy - Diabetic Negative for: Dementia; Quadriplegia; Paraplegia; Seizure Disorder Oncologic Medical History: Negative for: Received Chemotherapy; Received Radiation Psychiatric Medical History: Negative for: Anorexia/bulimia; Confinement Anxiety Immunizations Pneumococcal Vaccine: Received Pneumococcal Vaccination: No Implantable Devices No devices added Hospitalization / Surgery History Type of Hospitalization/Surgery Incisionand Drainage Abscess Complicated/Multiple gastric port. Incisional Drainage Hernia Family and Social History Cancer: Yes -  Mother,Father; Diabetes: Yes - Mother,Father,Siblings; Heart Disease: Yes - Siblings; Hereditary Spherocytosis: No; Hypertension: No; Kidney Disease: No; Lung Disease: Yes - Mother; Seizures: No; Stroke: No; Thyroid Problems: No; Tuberculosis: No; Never smoker; Marital  Status - Single; Alcohol Use: Never; Drug Use: No History; Caffeine Use: Daily - Coffee; Financial Concerns: No; Food, Clothing or Shelter Needs: No; Support System Lacking: No; Transportation Concerns: No Electronic Signature(s) Signed: 06/27/2023 9:13:05 AM By: [name] Guess MD FACS Signed: 06/27/2023 3:53:00 PM By: [name] Schwalbe RN Entered By: [name] on 06/27/2023 08:27:12 [name], [name] ([id number]) [id number] Page 9 of 9 -------------------------------------------------------------------------------- SuperBill Details Patient Name: Date of Service: [name], [name] 06/27/2023 Medical Record Number: [id number] Patient Account Number: [id number] Date of Birth/Sex: Treating RN: 1955-10-19 (67 y.o. M) Primary Care Provider: Debria [name] Other Clinician: Referring Provider: Treating Provider/Extender: [name] in Treatment: 0 Diagnosis Coding ICD-10 Codes Code Description [phone] Non-pressure chronic ulcer of skin of other sites with fat layer exposed E66.01 Morbid (severe) obesity due to excess calories E11.622 Type 2 diabetes mellitus with other skin ulcer Facility Procedures : CPT4 Code: 25956387 Description: 56433 - WOUND CARE VISIT-LEV 2 EST PT Modifier: Quantity: 1 Physician Procedures : CPT4 Code Description Modifier [id number] 99213 - WC PHYS LEVEL 3 - EST PT ICD-10 Diagnosis Description L98.492 Non-pressure chronic ulcer of skin of other sites with fat layer exposed E66.01 Morbid (severe) obesity due to excess calories E11.622 Type 2  diabetes mellitus with other skin ulcer Quantity: 1 Electronic Signature(s) Signed: 06/27/2023 9:53:05 AM By: [name] Guess MD FACS Signed: 06/27/2023 3:53:00 PM By: [name] Schwalbe RN Previous Signature: 06/27/2023 8:41:36 AM Version By: [name] Guess MD FACS Entered By: [name] on 06/27/2023 09:29:53  [name], Truett ([id number]) [id number] Page 1 of 9 Visit Report for 06/27/2023 Chief Complaint Document Details Patient Name: Date of Service: [name], [name] 06/27/2023 8:00 A M Medical Record Number: [id number] Patient Account Number: [id number] Date of Birth/Sex: Treating RN: 1955/10/27 (67 y.o. M) Primary Care Provider: Debria [name] Other Clinician: Referring Provider: Treating Provider/Extender: [name] in Treatment: 0 Information Obtained from: Patient Chief Complaint 11/09/2018; patient is here for review of wounds on his dorsal right first and second toes and on the dorsal left first toe. 02/22/2022: The patient is here for a new ulcer on his posterior left heel. 08/18/2022: here with new wound on abdomen, similar to prior 06/27/2023: returns after surgery Electronic Signature(s) Signed: 06/27/2023 8:35:37 AM By: [name] Guess MD FACS Previous Signature: 06/27/2023 8:13:25 AM Version By: [name] Guess MD FACS Entered By: [name] on 06/27/2023 08:35:37 -------------------------------------------------------------------------------- HPI Details Patient Name: Date of Service: [name], [name] 06/27/2023 8:00 A M Medical Record Number: [id number] Patient Account Number: [id number] Date of Birth/Sex: Treating RN: 18-Aug-1956 (67 y.o. M) Primary Care Provider: Debria [name] Other Clinician: Referring Provider: Treating Provider/Extender: [name] in Treatment: 0 History of Present Illness HPI Description: READMISSION 11/09/2018 This is a now 67 year old man that we had in this clinic over a multitude of years but most recently in 2015. He had wounds on his plantar foot initially in 2007 and 2008 and I think subsequently was seen in 2012 then 2014 into the mid part of 2015 with wounds on his toes. He is a type II diabetic with peripheral neuropathy but no known PAD. The patient states he  was doing well until about a month ago he noted an area on the left great toe which was superficial and then an area on the right great and second toes about a week later. He is not sure how this happened he simply noticed this when he was lying in bed at night. He has been using peroxide. He has not seen a doctor. He has not been on antibiotics and has had no x-rays. Past medical history; type 2 diabetes with peripheral neuropathy, hypertension, hypothyroidism, low B12 levels, iron deficiency anemia, ventral hernia, diabetic nephropathy, chronic diastolic heart failure and obstructive sleep apnea. ABIs in this clinic were 1.2 on the right and 1.1 on the left 2/28; patient arrived last week for areas on his left first right first and right second toes. X-ray of the right foot did not show any osseous abnormality. Culture of the right great toe showed Staphylococcus aureus methicillin sensitive and he is on dicloxacillin which he started 3 days ago. 3/6; patient with wounds on his right first and second toes and left first toes. The area over the left first toe is extensive we have been using silver alginate. Culture grew MSSA and he is on dicloxacillin which I prescribed on the 25th. He is still taking this which I am not really sure of the reason. He expressed knowledge that this was 4 times a day he should have been out of this around March 2 if he picked it up on the 25th or they gave him the right number of pills. Nevertheless our nurses report more purulent drainage and odor 3/17-Patient returns for the 3 wounds the left first toe and the right first and second toe dorsal surfaces. The left first toe is closed up and healed, the right first toe dorsal surface wound is extensive and appears the same as last time, no  of depth. The opening was partially closed down to a 2 x 2 cm orifice but still 5 cm deep. He has been followed in the [facility] for the past 3 months due to the postoperative global period. He is now back in our clinic for further management of his open wound site. T oday, however, his wound is completely closed. Electronic Signature(s) Signed: 06/27/2023 8:36:30 AM By: [name] Guess MD FACS Previous Signature: 06/27/2023 8:17:37 AM Version By: [name] Guess MD FACS Entered By: [name] on 06/27/2023 08:36:30 [name], [name] ([id number]) [id number] Page 3 of 9 -------------------------------------------------------------------------------- Physical Exam Details Patient Name: Date of Service: [name], [name] 06/27/2023 8:00 A M Medical Record Number: [id number] Patient Account Number: [id number] Date of Birth/Sex: Treating RN: 12/25/1955  (67 y.o. M) Primary Care Provider: Debria [name] Other Clinician: Referring Provider: Treating Provider/Extender: [name] in Treatment: 0 Constitutional . . . . No acute distress. Respiratory Normal work of breathing on room air. Notes 06/27/2023: He has a well-healed scar on his abdomen where his Lap-Band port was removed. No open wounds present today. Electronic Signature(s) Signed: 06/27/2023 8:37:44 AM By: [name] Guess MD FACS Entered By: [name] on 06/27/2023 08:37:44 -------------------------------------------------------------------------------- Physician Orders Details Patient Name: Date of Service: [name], [name] 06/27/2023 8:00 A M Medical Record Number: [id number] Patient Account Number: [id number] Date of Birth/Sex: Treating RN: [birth year] (67 y.o. [name] Primary Care Provider: Debria [name] Other Clinician: Referring Provider: Treating Provider/Extender: [name] in Treatment: 0 Verbal / Phone Orders: No Diagnosis Coding ICD-10 Coding Code Description [phone] Non-pressure chronic ulcer of skin of other sites with fat layer exposed E66.01 Morbid (severe) obesity due to excess calories E11.622 Type 2 diabetes mellitus with other skin ulcer Discharge From [facility] Discharge from Wound Care Center - Congratulations!!! Electronic Signature(s) Signed: 06/27/2023 8:39:49 AM By: [name] Guess MD FACS Entered By: [name] on 06/27/2023 08:39:48 -------------------------------------------------------------------------------- Problem List Details Patient Name: Date of Service: [name], [name] 06/27/2023 8:00 A [name], [name] ([id number]) [id number] Page 4 of 9 Medical Record Number: [id number] Patient Account Number: [id number] Date of Birth/Sex: Treating RN: 1956/08/18 (67 y.o. M) Primary Care Provider: Debria [name] Other Clinician: Referring  Provider: Treating Provider/Extender: [name] in Treatment: 0 Active Problems ICD-10 Encounter Code Description Active Date MDM Diagnosis [phone] Non-pressure chronic ulcer of skin of other sites with fat layer exposed 06/27/2023 No Yes E66.01 Morbid (severe) obesity due to excess calories 06/27/2023 No Yes E11.622 Type 2 diabetes mellitus with other skin ulcer 06/27/2023 No Yes Inactive Problems Resolved Problems Electronic Signature(s) Signed: 06/27/2023 8:12:40 AM By: [name] Guess MD FACS Entered By: [name] on 06/27/2023 08:12:40 -------------------------------------------------------------------------------- Progress Note Details Patient Name: Date of Service: [name], [name] 06/27/2023 8:00 A M Medical Record Number: [id number] Patient Account Number: [id number] Date of Birth/Sex: Treating RN: 1956/06/01 (67 y.o. M) Primary Care Provider: Debria [name] Other Clinician: Referring Provider: Treating Provider/Extender: [name] in Treatment: 0 Subjective Chief Complaint Information obtained from Patient 11/09/2018; patient is here for review of wounds on his dorsal right first and second toes and on the dorsal left first toe. 02/22/2022: The patient is here for a new ulcer on his posterior left heel. 08/18/2022: here with new wound on abdomen, similar to prior 06/27/2023: returns after surgery History of Present Illness (HPI) READMISSION 11/09/2018 This is a now 67 year old man that we had in this clinic over a multitude of years but most recently  of depth. The opening was partially closed down to a 2 x 2 cm orifice but still 5 cm deep. He has been followed in the [facility] clinic for the past 3 months due to the postoperative global period. He is now back in our clinic for further management of his open wound site. T oday, however, his wound is completely closed. Electronic Signature(s) Signed: 06/27/2023 8:36:30 AM By: [name] Guess MD FACS Previous Signature: 06/27/2023 8:17:37 AM Version By: [name] Guess MD FACS Entered By: [name] on 06/27/2023 08:36:30 [name], [name] ([id number]) [id number] Page 3 of 9 -------------------------------------------------------------------------------- Physical Exam Details Patient Name: Date of Service: [name], [name] 06/27/2023 8:00 A M Medical Record Number: [id number] Patient Account Number: [id number] Date of Birth/Sex: Treating RN: 12/25/1955  (67 y.o. M) Primary Care Provider: Debria [name] Other Clinician: Referring Provider: Treating Provider/Extender: [name] in Treatment: 0 Constitutional . . . . No acute distress. Respiratory Normal work of breathing on room air. Notes 06/27/2023: He has a well-healed scar on his abdomen where his Lap-Band port was removed. No open wounds present today. Electronic Signature(s) Signed: 06/27/2023 8:37:44 AM By: [name] Guess MD FACS Entered By: [name] on 06/27/2023 08:37:44 -------------------------------------------------------------------------------- Physician Orders Details Patient Name: Date of Service: [name], [name] 06/27/2023 8:00 A M Medical Record Number: [id number] Patient Account Number: [id number] Date of Birth/Sex: Treating RN: [birth year] (67 y.o. [name] Primary Care Provider: Debria [name] Other Clinician: Referring Provider: Treating Provider/Extender: [name] in Treatment: 0 Verbal / Phone Orders: No Diagnosis Coding ICD-10 Coding Code Description [phone] Non-pressure chronic ulcer of skin of other sites with fat layer exposed E66.01 Morbid (severe) obesity due to excess calories E11.622 Type 2 diabetes mellitus with other skin ulcer Discharge From [facility] Discharge from Wound Care Center - Congratulations!!! Electronic Signature(s) Signed: 06/27/2023 8:39:49 AM By: [name] Guess MD FACS Entered By: [name] on 06/27/2023 08:39:48 -------------------------------------------------------------------------------- Problem List Details Patient Name: Date of Service: [name], [name] 06/27/2023 8:00 A [name], [name] ([id number]) [id number] Page 4 of 9 Medical Record Number: [id number] Patient Account Number: [id number] Date of Birth/Sex: Treating RN: 1956/08/18 (67 y.o. M) Primary Care Provider: Debria [name] Other Clinician: Referring  Provider: Treating Provider/Extender: [name] in Treatment: 0 Active Problems ICD-10 Encounter Code Description Active Date MDM Diagnosis [phone] Non-pressure chronic ulcer of skin of other sites with fat layer exposed 06/27/2023 No Yes E66.01 Morbid (severe) obesity due to excess calories 06/27/2023 No Yes E11.622 Type 2 diabetes mellitus with other skin ulcer 06/27/2023 No Yes Inactive Problems Resolved Problems Electronic Signature(s) Signed: 06/27/2023 8:12:40 AM By: [name] Guess MD FACS Entered By: [name] on 06/27/2023 08:12:40 -------------------------------------------------------------------------------- Progress Note Details Patient Name: Date of Service: [name], [name] 06/27/2023 8:00 A M Medical Record Number: [id number] Patient Account Number: [id number] Date of Birth/Sex: Treating RN: 1956/06/01 (67 y.o. M) Primary Care Provider: Debria [name] Other Clinician: Referring Provider: Treating Provider/Extender: [name] in Treatment: 0 Subjective Chief Complaint Information obtained from Patient 11/09/2018; patient is here for review of wounds on his dorsal right first and second toes and on the dorsal left first toe. 02/22/2022: The patient is here for a new ulcer on his posterior left heel. 08/18/2022: here with new wound on abdomen, similar to prior 06/27/2023: returns after surgery History of Present Illness (HPI) READMISSION 11/09/2018 This is a now 67 year old man that we had in this clinic over a multitude of years but most recently  of depth. The opening was partially closed down to a 2 x 2 cm orifice but still 5 cm deep. He has been followed in the [facility] for the past 3 months due to the postoperative global period. He is now back in our clinic for further management of his open wound site. T oday, however, his wound is completely closed. Electronic Signature(s) Signed: 06/27/2023 8:36:30 AM By: [name] Guess MD FACS Previous Signature: 06/27/2023 8:17:37 AM Version By: [name] Guess MD FACS Entered By: [name] on 06/27/2023 08:36:30 [name], [name] ([id number]) [id number] Page 3 of 9 -------------------------------------------------------------------------------- Physical Exam Details Patient Name: Date of Service: [name], [name] 06/27/2023 8:00 A M Medical Record Number: [id number] Patient Account Number: [id number] Date of Birth/Sex: Treating RN: 12/25/1955  (67 y.o. M) Primary Care Provider: Debria [name] Other Clinician: Referring Provider: Treating Provider/Extender: [name] in Treatment: 0 Constitutional . . . . No acute distress. Respiratory Normal work of breathing on room air. Notes 06/27/2023: He has a well-healed scar on his abdomen where his Lap-Band port was removed. No open wounds present today. Electronic Signature(s) Signed: 06/27/2023 8:37:44 AM By: [name] Guess MD FACS Entered By: [name] on 06/27/2023 08:37:44 -------------------------------------------------------------------------------- Physician Orders Details Patient Name: Date of Service: [name], [name] 06/27/2023 8:00 A M Medical Record Number: [id number] Patient Account Number: [id number] Date of Birth/Sex: Treating RN: [birth year] (67 y.o. [name] Primary Care Provider: Debria [name] Other Clinician: Referring Provider: Treating Provider/Extender: [name] in Treatment: 0 Verbal / Phone Orders: No Diagnosis Coding ICD-10 Coding Code Description [phone] Non-pressure chronic ulcer of skin of other sites with fat layer exposed E66.01 Morbid (severe) obesity due to excess calories E11.622 Type 2 diabetes mellitus with other skin ulcer Discharge From [facility] Discharge from Wound Care Center - Congratulations!!! Electronic Signature(s) Signed: 06/27/2023 8:39:49 AM By: [name] Guess MD FACS Entered By: [name] on 06/27/2023 08:39:48 -------------------------------------------------------------------------------- Problem List Details Patient Name: Date of Service: [name], [name] 06/27/2023 8:00 A [name], [name] ([id number]) [id number] Page 4 of 9 Medical Record Number: [id number] Patient Account Number: [id number] Date of Birth/Sex: Treating RN: 1956/08/18 (67 y.o. M) Primary Care Provider: Debria [name] Other Clinician: Referring  Provider: Treating Provider/Extender: [name] in Treatment: 0 Active Problems ICD-10 Encounter Code Description Active Date MDM Diagnosis [phone] Non-pressure chronic ulcer of skin of other sites with fat layer exposed 06/27/2023 No Yes E66.01 Morbid (severe) obesity due to excess calories 06/27/2023 No Yes E11.622 Type 2 diabetes mellitus with other skin ulcer 06/27/2023 No Yes Inactive Problems Resolved Problems Electronic Signature(s) Signed: 06/27/2023 8:12:40 AM By: [name] Guess MD FACS Entered By: [name] on 06/27/2023 08:12:40 -------------------------------------------------------------------------------- Progress Note Details Patient Name: Date of Service: [name], [name] 06/27/2023 8:00 A M Medical Record Number: [id number] Patient Account Number: [id number] Date of Birth/Sex: Treating RN: 1956/06/01 (67 y.o. M) Primary Care Provider: Debria [name] Other Clinician: Referring Provider: Treating Provider/Extender: [name] in Treatment: 0 Subjective Chief Complaint Information obtained from Patient 11/09/2018; patient is here for review of wounds on his dorsal right first and second toes and on the dorsal left first toe. 02/22/2022: The patient is here for a new ulcer on his posterior left heel. 08/18/2022: here with new wound on abdomen, similar to prior 06/27/2023: returns after surgery History of Present Illness (HPI) READMISSION 11/09/2018 This is a now 67 year old man that we had in this clinic over a multitude of years but most recently  of depth. The opening was partially closed down to a 2 x 2 cm orifice but still 5 cm deep. He has been followed in the [facility] clinic for the past 3 months due to the postoperative global period. He is now back in our clinic for further management of his open wound site. T oday, however, his wound is completely closed. Electronic Signature(s) Signed: 06/27/2023 8:36:30 AM By: [name] Guess MD FACS Previous Signature: 06/27/2023 8:17:37 AM Version By: [name] Guess MD FACS Entered By: [name] on 06/27/2023 08:36:30 [name], [name] ([id number]) [id number] Page 3 of 9 -------------------------------------------------------------------------------- Physical Exam Details Patient Name: Date of Service: [name], [name] 06/27/2023 8:00 A M Medical Record Number: [id number] Patient Account Number: [id number] Date of Birth/Sex: Treating RN: 12/25/1955  (67 y.o. M) Primary Care Provider: Debria [name] Other Clinician: Referring Provider: Treating Provider/Extender: [name] in Treatment: 0 Constitutional . . . . No acute distress. Respiratory Normal work of breathing on room air. Notes 06/27/2023: He has a well-healed scar on his abdomen where his Lap-Band port was removed. No open wounds present today. Electronic Signature(s) Signed: 06/27/2023 8:37:44 AM By: [name] Guess MD FACS Entered By: [name] on 06/27/2023 08:37:44 -------------------------------------------------------------------------------- Physician Orders Details Patient Name: Date of Service: [name], [name] 06/27/2023 8:00 A M Medical Record Number: [id number] Patient Account Number: [id number] Date of Birth/Sex: Treating RN: [birth year] (67 y.o. [name] Primary Care Provider: Debria [name] Other Clinician: Referring Provider: Treating Provider/Extender: [name] in Treatment: 0 Verbal / Phone Orders: No Diagnosis Coding ICD-10 Coding Code Description [phone] Non-pressure chronic ulcer of skin of other sites with fat layer exposed E66.01 Morbid (severe) obesity due to excess calories E11.622 Type 2 diabetes mellitus with other skin ulcer Discharge From [facility] - Congratulations!!! Electronic Signature(s) Signed: 06/27/2023 8:39:49 AM By: [name] Guess MD FACS Entered By: [name] on 06/27/2023 08:39:48 -------------------------------------------------------------------------------- Problem List Details Patient Name: Date of Service: [name], [name] 06/27/2023 8:00 A [name], [name] ([id number]) [id number] Page 4 of 9 Medical Record Number: [id number] Patient Account Number: [id number] Date of Birth/Sex: Treating RN: 1956/08/18 (67 y.o. M) Primary Care Provider: Debria [name] Other Clinician: Referring  Provider: Treating Provider/Extender: [name] in Treatment: 0 Active Problems ICD-10 Encounter Code Description Active Date MDM Diagnosis [phone] Non-pressure chronic ulcer of skin of other sites with fat layer exposed 06/27/2023 No Yes E66.01 Morbid (severe) obesity due to excess calories 06/27/2023 No Yes E11.622 Type 2 diabetes mellitus with other skin ulcer 06/27/2023 No Yes Inactive Problems Resolved Problems Electronic Signature(s) Signed: 06/27/2023 8:12:40 AM By: [name] Guess MD FACS Entered By: [name] on 06/27/2023 08:12:40 -------------------------------------------------------------------------------- Progress Note Details Patient Name: Date of Service: [name], [name] 06/27/2023 8:00 A M Medical Record Number: [id number] Patient Account Number: [id number] Date of Birth/Sex: Treating RN: 1956/06/01 (67 y.o. M) Primary Care Provider: Debria [name] Other Clinician: Referring Provider: Treating Provider/Extender: [name] in Treatment: 0 Subjective Chief Complaint Information obtained from Patient 11/09/2018; patient is here for review of wounds on his dorsal right first and second toes and on the dorsal left first toe. 02/22/2022: The patient is here for a new ulcer on his posterior left heel. 08/18/2022: here with new wound on abdomen, similar to prior 06/27/2023: returns after surgery History of Present Illness (HPI) READMISSION 11/09/2018 This is a now 67 year old man that we had in this clinic over a multitude of years but most recently  Mother,Father; Diabetes: Yes - Mother,Father,Siblings; Heart Disease: Yes - Siblings; Hereditary Spherocytosis: No; Hypertension: No; Kidney Disease: No; Lung Disease: Yes - Mother; Seizures: No; Stroke: No; Thyroid Problems: No; Tuberculosis: No; Never smoker; Marital  Status - Single; Alcohol Use: Never; Drug Use: No History; Caffeine Use: Daily - Coffee; Financial Concerns: No; Food, Clothing or Shelter Needs: No; Support System Lacking: No; Transportation Concerns: No Electronic Signature(s) Signed: 06/27/2023 9:13:05 AM By: [name] Guess MD FACS Signed: 06/27/2023 3:53:00 PM By: [name] Schwalbe RN Entered By: [name] on 06/27/2023 08:27:12 [name], [name] ([id number]) [id number] Page 9 of 9 -------------------------------------------------------------------------------- SuperBill Details Patient Name: Date of Service: [name], [name] 06/27/2023 Medical Record Number: [id number] Patient Account Number: [id number] Date of Birth/Sex: Treating RN: 1955-10-19 (67 y.o. M) Primary Care Provider: Debria [name] Other Clinician: Referring Provider: Treating Provider/Extender: [name] in Treatment: 0 Diagnosis Coding ICD-10 Codes Code Description [phone] Non-pressure chronic ulcer of skin of other sites with fat layer exposed E66.01 Morbid (severe) obesity due to excess calories E11.622 Type 2 diabetes mellitus with other skin ulcer Facility Procedures : CPT4 Code: 25956387 Description: 56433 - WOUND CARE VISIT-LEV 2 EST PT Modifier: Quantity: 1 Physician Procedures : CPT4 Code Description Modifier [id number] 99213 - WC PHYS LEVEL 3 - EST PT ICD-10 Diagnosis Description L98.492 Non-pressure chronic ulcer of skin of other sites with fat layer exposed E66.01 Morbid (severe) obesity due to excess calories E11.622 Type 2  diabetes mellitus with other skin ulcer Quantity: 1 Electronic Signature(s) Signed: 06/27/2023 9:53:05 AM By: [name] Guess MD FACS Signed: 06/27/2023 3:53:00 PM By: [name] Schwalbe RN Previous Signature: 06/27/2023 8:41:36 AM Version By: [name] Guess MD FACS Entered By: [name] on 06/27/2023 09:29:53  [name], Truett ([id number]) [id number] Page 1 of 9 Visit Report for 06/27/2023 Chief Complaint Document Details Patient Name: Date of Service: [name], [name] 06/27/2023 8:00 A M Medical Record Number: [id number] Patient Account Number: [id number] Date of Birth/Sex: Treating RN: 1955/10/27 (67 y.o. M) Primary Care Provider: Debria [name] Other Clinician: Referring Provider: Treating Provider/Extender: [name] in Treatment: 0 Information Obtained from: Patient Chief Complaint 11/09/2018; patient is here for review of wounds on his dorsal right first and second toes and on the dorsal left first toe. 02/22/2022: The patient is here for a new ulcer on his posterior left heel. 08/18/2022: here with new wound on abdomen, similar to prior 06/27/2023: returns after surgery Electronic Signature(s) Signed: 06/27/2023 8:35:37 AM By: [name] Guess MD FACS Previous Signature: 06/27/2023 8:13:25 AM Version By: [name] Guess MD FACS Entered By: [name] on 06/27/2023 08:35:37 -------------------------------------------------------------------------------- HPI Details Patient Name: Date of Service: [name], [name] 06/27/2023 8:00 A M Medical Record Number: [id number] Patient Account Number: [id number] Date of Birth/Sex: Treating RN: 18-Aug-1956 (67 y.o. M) Primary Care Provider: Debria [name] Other Clinician: Referring Provider: Treating Provider/Extender: [name] in Treatment: 0 History of Present Illness HPI Description: READMISSION 11/09/2018 This is a now 67 year old man that we had in this clinic over a multitude of years but most recently in 2015. He had wounds on his plantar foot initially in 2007 and 2008 and I think subsequently was seen in 2012 then 2014 into the mid part of 2015 with wounds on his toes. He is a type II diabetic with peripheral neuropathy but no known PAD. The patient states he  was doing well until about a month ago he noted an area on the left great toe which was superficial and then an area on the right great and second toes about a week later. He is not sure how this happened he simply noticed this when he was lying in bed at night. He has been using peroxide. He has not seen a doctor. He has not been on antibiotics and has had no x-rays. Past medical history; type 2 diabetes with peripheral neuropathy, hypertension, hypothyroidism, low B12 levels, iron deficiency anemia, ventral hernia, diabetic nephropathy, chronic diastolic heart failure and obstructive sleep apnea. ABIs in this clinic were 1.2 on the right and 1.1 on the left 2/28; patient arrived last week for areas on his left first right first and right second toes. X-ray of the right foot did not show any osseous abnormality. Culture of the right great toe showed Staphylococcus aureus methicillin sensitive and he is on dicloxacillin which he started 3 days ago. 3/6; patient with wounds on his right first and second toes and left first toes. The area over the left first toe is extensive we have been using silver alginate. Culture grew MSSA and he is on dicloxacillin which I prescribed on the 25th. He is still taking this which I am not really sure of the reason. He expressed knowledge that this was 4 times a day he should have been out of this around March 2 if he picked it up on the 25th or they gave him the right number of pills. Nevertheless our nurses report more purulent drainage and odor 3/17-Patient returns for the 3 wounds the left first toe and the right first and second toe dorsal surfaces. The left first toe is closed up and healed, the right first toe dorsal surface wound is extensive and appears the same as last time, no

## 2023-06-27 NOTE — Progress Notes (Signed)
[name], Tayon ([id number]) [id number] Page 1 of 2 Visit Report for 06/27/2023 Abuse Risk Screen Details Patient Name: Date of Service: BARRET, ESQUIVEL 06/27/2023 8:00 A M Medical Record Number: [id number] Patient Account Number: [id number] Date of Birth/Sex: Treating RN: 24-Oct-1955 (67 y.o. Dianna Limbo Primary Care Garlan Drewes: Debria Garret Other Clinician: Referring Daylee Delahoz: Treating Tangie Stay/Extender: Leeroy Bock in Treatment: 0 Abuse Risk Screen Items Answer ABUSE RISK SCREEN: Has anyone close to you tried to hurt or harm you recentlyo No Do you feel uncomfortable with anyone in your familyo No Has anyone forced you do things that you didnt want to doo No Electronic Signature(s) Signed: 06/27/2023 3:53:00 PM By: Karie Schwalbe RN Entered By: Karie Schwalbe on 06/27/2023 08:27:16 -------------------------------------------------------------------------------- Activities of Daily Living Details Patient Name: Date of Service: ASHKAN, [place]AMBER[place] 06/27/2023 8:00 A M Medical Record Number: [id number] Patient Account Number: [id number] Date of Birth/Sex: Treating RN: 01/12/1956 (67 y.o. Dianna Limbo Primary Care Nickola Lenig: Debria Garret Other Clinician: Referring Larson Limones: Treating Zadin Lange/Extender: Leeroy Bock in Treatment: 0 Activities of Daily Living Items Answer Activities of Daily Living (Please select one for each item) Drive Automobile Completely Able T Medications ake Completely Able Use T elephone Completely Able Care for Appearance Completely Able Use T oilet Completely Able Bath / Shower Completely Able Dress Self Completely Able Feed Self Completely Able Walk Need Assistance Get In / Out Bed Completely Able Housework Completely Able Prepare Meals Completely Able Handle Money Completely Able Shop for Self Completely Able Electronic Signature(s) Signed: 06/27/2023  3:53:00 PM By: Karie Schwalbe RN Entered By: Karie Schwalbe on 06/27/2023 08:28:11 Scharfenberg, Khalil ([id number]) [id number] Page 2 of 2 -------------------------------------------------------------------------------- Education Screening Details Patient Name: Date of Service: JUSTINO, BOZE 06/27/2023 8:00 A M Medical Record Number: [id number] Patient Account Number: [id number] Date of Birth/Sex: Treating RN: September 09, 1956 (67 y.o. Dianna Limbo Primary Care Zebulen Simonis: Debria Garret Other Clinician: Referring Tiler Brandis: Treating Evani Shrider/Extender: Leeroy Bock in Treatment: 0 Primary Learner Assessed: Patient Learning Preferences/Education Level/Primary Language Learning Preference: Explanation, Demonstration, Printed Material Highest Education Level: College or Above Preferred Language: English Cognitive Barrier Language Barrier: No Translator Needed: No Memory Deficit: No Emotional Barrier: No Cultural/Religious Beliefs Affecting Medical Care: No Physical Barrier Impaired Vision: No Impaired Hearing: No Decreased Hand dexterity: No Knowledge/Comprehension Knowledge Level: High Comprehension Level: High Ability to understand written instructions: High Ability to understand verbal instructions: High Motivation Anxiety Level: Calm Cooperation: Cooperative Education Importance: Acknowledges Need Interest in Health Problems: Asks Questions Perception: Coherent Willingness to Engage in Self-Management High Activities: Readiness to Engage in Self-Management High Activities: Electronic Signature(s) Signed: 06/27/2023 3:53:00 PM By: Karie Schwalbe RN Entered By: Karie Schwalbe on 06/27/2023 08:28:50

## 2023-06-27 NOTE — Progress Notes (Signed)
[name], [name] ([id number]) [id number] Page 1 of 5 Visit Report for 06/27/2023 Allergy List Details Patient Name: Date of Service: [name], [name] 06/27/2023 8:00 A M Medical Record Number: [id number] Patient Account Number: [id number] Date of Birth/Sex: Treating RN: 24-Jan-1956 (67 y.o. Dianna Limbo Primary Care Antonio Woodhams: Debria Garret Other Clinician: Referring Octave Montrose: Treating Nickole Adamek/Extender: Kathleen Lime Weeks in Treatment: 0 Allergies Active Allergies Shellfish Containing Products Reaction: anaphylaxis Severity: Severe lisinopril Reaction: Cough Severity: Moderate lovastatin Severity: Severe Relistor Severity: Severe Type: Allergen Allergy Notes Electronic Signature(s) Signed: 06/27/2023 3:53:00 PM By: Karie Schwalbe RN Entered By: Karie Schwalbe on 06/27/2023 08:27:04 -------------------------------------------------------------------------------- Arrival Information Details Patient Name: Date of Service: [name], [name] 06/27/2023 8:00 A M Medical Record Number: [id number] Patient Account Number: [id number] Date of Birth/Sex: Treating RN: [birth year] (67 y.o. Dianna Limbo Primary Care Onnika Siebel: Debria Garret Other Clinician: Referring Andrae Claunch: Treating [place] Langstaff/Extender: Leeroy Bock in Treatment: 0 Visit Information Patient Arrived: Wheel Chair Arrival Time: 08:16 Accompanied By: self Transfer Assistance: None Patient Identification VerifiedELIYAH, [name] ([id number]) Electronic Signature(s) Signed: 06/27/2023 3:53:00 PM By: Karie Schwalbe RN Entered By: Baruch Merl, History Since Last Visit Added or deleted any medications: No Any new allergies or adverse reactions: No Had a fall or experienced change in activities of daily living that may affect risk of falls: No Signs or symptoms of abuse/neglect since last visito No Hospitalized since last visit: No Implantable  device outside of the clinic excluding cellular tissue based products placed in the center since last visit: No Pain Present Now: No [id number] Page 2 of 5 Joanne on 06/27/2023 08:16:46 -------------------------------------------------------------------------------- Clinic Level of Care Assessment Details Patient Name: Date of Service: [name], [name] 06/27/2023 8:00 A M Medical Record Number: [id number] Patient Account Number: [id number] Date of Birth/Sex: Treating RN: 05-31-1956 (67 y.o. Dianna Limbo Primary Care Kellyn Mccary: Debria Garret Other Clinician: Referring Cait Locust: Treating Toni Demo/Extender: Leeroy Bock in Treatment: 0 Clinic Level of Care Assessment Items TOOL 2 Quantity Score X- 1 0 Use when only an EandM is performed on the INITIAL visit ASSESSMENTS - Nursing Assessment / Reassessment []  - 0 General Physical Exam (combine w/ comprehensive assessment (listed just below) when performed on new pt. evals) []  - 0 Comprehensive Assessment (HX, ROS, Risk Assessments, Wounds Hx, etc.) ASSESSMENTS - Wound and Skin A ssessment / Reassessment X - Simple Wound Assessment / Reassessment - one wound 1 5 []  - 0 Complex Wound Assessment / Reassessment - multiple wounds []  - 0 Dermatologic / Skin Assessment (not related to wound area) ASSESSMENTS - Ostomy and/or Continence Assessment and Care []  - 0 Incontinence Assessment and Management []  - 0 Ostomy Care Assessment and Management (repouching, etc.) PROCESS - Coordination of Care X - Simple Patient / Family Education for ongoing care 1 15 []  - 0 Complex (extensive) Patient / Family Education for ongoing care X- 1 10 Staff obtains Chiropractor, Records, T Results / Process Orders est X- 1 10 Staff telephones HHA, Nursing Homes / Clarify orders / etc []  - 0 Routine Transfer to another Facility (non-emergent condition) []  - 0 Routine Hospital Admission (non-emergent  condition) []  - 0 New Admissions / Manufacturing engineer / Ordering NPWT Apligraf, etc. , []  - 0 Emergency Hospital Admission (emergent condition) X- 1 10 Simple Discharge Coordination []  - 0 Complex (extensive) Discharge Coordination PROCESS - Special Needs []  - 0 Pediatric / Minor Patient Management []  - 0 Isolation Patient Management []  - 0 Hearing / Language / Visual  Referring Physician: Treating Physician/Extender: Leeroy Bock in Treatment: 0 Education Assessment Education Provided ToZACHARIUS, [name] ([id number]) [id number] Page 5 of 5 Patient Education Topics Provided Wound/Skin Impairment: Methods: Explain/Verbal Responses: State content correctly Electronic Signature(s) Signed: 06/27/2023 3:53:00 PM By: Karie Schwalbe RN Entered By: Karie Schwalbe on 06/27/2023 09:31:06 -------------------------------------------------------------------------------- Vitals Details Patient Name: Date of Service: [name], [name] 06/27/2023 8:00 A M Medical Record Number: [id number] Patient Account Number: [id number] Date of Birth/Sex: Treating RN: 1955-12-25 (67 y.o. Dianna Limbo Primary Care Faun Mcqueen: Debria Garret Other Clinician: Referring Haroun Cotham: Treating Audria Takeshita/Extender: Leeroy Bock in Treatment: 0 Vital Signs Time Taken: 08:17 Temperature (F): 98.1 Height (in): 72 Pulse (bpm): 68 Weight (lbs): 400 Respiratory Rate (breaths/min): 18 Body Mass Index (BMI): 54.2 Blood Pressure (mmHg): 106/60 Reference Range: 80 - 120 mg / dl Electronic Signature(s) Signed: 06/27/2023 3:53:00 PM By: Karie Schwalbe RN Entered By: Karie Schwalbe on 06/27/2023 08:24:55  [name], [name] ([id number]) [id number] Page 1 of 5 Visit Report for 06/27/2023 Allergy List Details Patient Name: Date of Service: [name], [name] 06/27/2023 8:00 A M Medical Record Number: [id number] Patient Account Number: [id number] Date of Birth/Sex: Treating RN: 24-Jan-1956 (67 y.o. Dianna Limbo Primary Care Antonio Woodhams: Debria Garret Other Clinician: Referring Octave Montrose: Treating Nickole Adamek/Extender: Kathleen Lime Weeks in Treatment: 0 Allergies Active Allergies Shellfish Containing Products Reaction: anaphylaxis Severity: Severe lisinopril Reaction: Cough Severity: Moderate lovastatin Severity: Severe Relistor Severity: Severe Type: Allergen Allergy Notes Electronic Signature(s) Signed: 06/27/2023 3:53:00 PM By: Karie Schwalbe RN Entered By: Karie Schwalbe on 06/27/2023 08:27:04 -------------------------------------------------------------------------------- Arrival Information Details Patient Name: Date of Service: [name], [name] 06/27/2023 8:00 A M Medical Record Number: [id number] Patient Account Number: [id number] Date of Birth/Sex: Treating RN: [birth year] (67 y.o. Dianna Limbo Primary Care Onnika Siebel: Debria Garret Other Clinician: Referring Andrae Claunch: Treating [place] Langstaff/Extender: Leeroy Bock in Treatment: 0 Visit Information Patient Arrived: Wheel Chair Arrival Time: 08:16 Accompanied By: self Transfer Assistance: None Patient Identification VerifiedELIYAH, [name] ([id number]) Electronic Signature(s) Signed: 06/27/2023 3:53:00 PM By: Karie Schwalbe RN Entered By: Baruch Merl, History Since Last Visit Added or deleted any medications: No Any new allergies or adverse reactions: No Had a fall or experienced change in activities of daily living that may affect risk of falls: No Signs or symptoms of abuse/neglect since last visito No Hospitalized since last visit: No Implantable  device outside of the clinic excluding cellular tissue based products placed in the center since last visit: No Pain Present Now: No [id number] Page 2 of 5 Joanne on 06/27/2023 08:16:46 -------------------------------------------------------------------------------- Clinic Level of Care Assessment Details Patient Name: Date of Service: [name], [name] 06/27/2023 8:00 A M Medical Record Number: [id number] Patient Account Number: [id number] Date of Birth/Sex: Treating RN: 05-31-1956 (67 y.o. Dianna Limbo Primary Care Kellyn Mccary: Debria Garret Other Clinician: Referring Cait Locust: Treating Toni Demo/Extender: Leeroy Bock in Treatment: 0 Clinic Level of Care Assessment Items TOOL 2 Quantity Score X- 1 0 Use when only an EandM is performed on the INITIAL visit ASSESSMENTS - Nursing Assessment / Reassessment []  - 0 General Physical Exam (combine w/ comprehensive assessment (listed just below) when performed on new pt. evals) []  - 0 Comprehensive Assessment (HX, ROS, Risk Assessments, Wounds Hx, etc.) ASSESSMENTS - Wound and Skin A ssessment / Reassessment X - Simple Wound Assessment / Reassessment - one wound 1 5 []  - 0 Complex Wound Assessment / Reassessment - multiple wounds []  - 0 Dermatologic / Skin Assessment (not related to wound area) ASSESSMENTS - Ostomy and/or Continence Assessment and Care []  - 0 Incontinence Assessment and Management []  - 0 Ostomy Care Assessment and Management (repouching, etc.) PROCESS - Coordination of Care X - Simple Patient / Family Education for ongoing care 1 15 []  - 0 Complex (extensive) Patient / Family Education for ongoing care X- 1 10 Staff obtains Chiropractor, Records, T Results / Process Orders est X- 1 10 Staff telephones HHA, Nursing Homes / Clarify orders / etc []  - 0 Routine Transfer to another Facility (non-emergent condition) []  - 0 Routine Hospital Admission (non-emergent  condition) []  - 0 New Admissions / Manufacturing engineer / Ordering NPWT Apligraf, etc. , []  - 0 Emergency Hospital Admission (emergent condition) X- 1 10 Simple Discharge Coordination []  - 0 Complex (extensive) Discharge Coordination PROCESS - Special Needs []  - 0 Pediatric / Minor Patient Management []  - 0 Isolation Patient Management []  - 0 Hearing / Language / Visual

## 2023-07-11 ENCOUNTER — Encounter (HOSPITAL_COMMUNITY): Payer: Self-pay | Admitting: Cardiology

## 2023-07-11 ENCOUNTER — Ambulatory Visit (HOSPITAL_COMMUNITY)
Admission: RE | Admit: 2023-07-11 | Discharge: 2023-07-11 | Disposition: A | Discharge status: Home / Self Care | Payer: 59 | Source: Ambulatory Visit | Attending: Cardiology | Admitting: Cardiology

## 2023-07-11 VITALS — BP 104/60 | HR 62 | Wt >= 6400 oz

## 2023-07-11 DIAGNOSIS — I130 Hypertensive heart and chronic kidney disease with heart failure and stage 1 through stage 4 chronic kidney disease, or unspecified chronic kidney disease: Secondary | ICD-10-CM | POA: Insufficient documentation

## 2023-07-11 DIAGNOSIS — Z7984 Long term (current) use of oral hypoglycemic drugs: Secondary | ICD-10-CM | POA: Insufficient documentation

## 2023-07-11 DIAGNOSIS — Z794 Long term (current) use of insulin: Secondary | ICD-10-CM | POA: Diagnosis not present

## 2023-07-11 DIAGNOSIS — N183 Chronic kidney disease, stage 3 unspecified: Secondary | ICD-10-CM

## 2023-07-11 DIAGNOSIS — Z6841 Body Mass Index (BMI) 40.0 and over, adult: Secondary | ICD-10-CM | POA: Diagnosis not present

## 2023-07-11 DIAGNOSIS — Z79899 Other long term (current) drug therapy: Secondary | ICD-10-CM | POA: Diagnosis not present

## 2023-07-11 DIAGNOSIS — E785 Hyperlipidemia, unspecified: Secondary | ICD-10-CM | POA: Insufficient documentation

## 2023-07-11 DIAGNOSIS — G4733 Obstructive sleep apnea (adult) (pediatric): Secondary | ICD-10-CM | POA: Insufficient documentation

## 2023-07-11 DIAGNOSIS — N1832 Chronic kidney disease, stage 3b: Secondary | ICD-10-CM | POA: Diagnosis not present

## 2023-07-11 DIAGNOSIS — E1122 Type 2 diabetes mellitus with diabetic chronic kidney disease: Secondary | ICD-10-CM | POA: Insufficient documentation

## 2023-07-11 DIAGNOSIS — R2681 Unsteadiness on feet: Secondary | ICD-10-CM | POA: Diagnosis not present

## 2023-07-11 DIAGNOSIS — I5032 Chronic diastolic (congestive) heart failure: Secondary | ICD-10-CM | POA: Diagnosis present

## 2023-07-11 LAB — BASIC METABOLIC PANEL
Anion gap: 10 (ref 5–15)
BUN: 57 mg/dL — ABNORMAL HIGH (ref 8–23)
CO2: 27 mmol/L (ref 22–32)
Calcium: 9 mg/dL (ref 8.9–10.3)
Chloride: 102 mmol/L (ref 98–111)
Creatinine, Ser: 2.08 mg/dL — ABNORMAL HIGH (ref 0.61–1.24)
GFR, Estimated: 35 mL/min — ABNORMAL LOW (ref >60)
Glucose, Bld: 153 mg/dL — ABNORMAL HIGH (ref 70–99)
Potassium: 3.7 mmol/L (ref 3.5–5.1)
Sodium: 139 mmol/L (ref 135–145)

## 2023-07-11 LAB — LIPID PANEL
Cholesterol: 145 mg/dL (ref 0–200)
HDL: 30 mg/dL — ABNORMAL LOW (ref >40)
LDL Cholesterol: 97 mg/dL (ref 0–99)
Total CHOL/HDL Ratio: 4.8 {ratio}
Triglycerides: 91 mg/dL (ref <150)
VLDL: 18 mg/dL (ref 0–40)

## 2023-07-11 LAB — BRAIN NATRIURETIC PEPTIDE: B Natriuretic Peptide: 93.3 pg/mL (ref 0.0–100.0)

## 2023-07-11 NOTE — Progress Notes (Addendum)
ID:  [name], DOB 23-Apr-1956, MRN [id number]   Provider location: [facility] Type of Visit: Established patient   PCP:  Estevan Oaks, NP  HF Cardiologist:  Dr. Shirlee Latch   HPI [name] is a 67 y.o. male who has history of morbid obesity, diabetes II, and diastolic CHF. S/P Lap band procedure years ago.   Echo in 10/23 showed EF 45%, global hypokinesis, mild RV enlargement with mildly decreased RV systolic function.  Sleep study in 1/24 showed severe OSA but patient never got CPAP.   Today he returns for HF follow up. He uses an Mining engineer wheelchair when outside of his house. Uses a walker when inside his home, unsteady on his feet without walker.  No dyspnea walking around his house with walker. Weight down 11 lbs.  No chest pain.  Not very active.  Raises head of bed at night. Wants to start walking in pool for exercise, has done this in the past.   ECG (personally reviewed): NSR, LBBB 142 msec  Labs (8/10): creatinine 1.1, LDL 94, HDL 34 Labs (8/11): K 4.8, creatinine 1.2, LDL 93, HDL 33 Labs (10/12): K 5.4, creatinine 1.5, LDL 95, HDL 42 Labs (8/14): LDL 115, HDL 42 Labs (9/14): BNP 68 Labs (10/14): K 4.5, creatinine 1.6 Labs (7/15): K 4.4, creatinine 1.3 Labs (8/15): LDL 136, HDL 37 Labs (11/17): K 4.6, creatinine 1.43, LDL 111, HDL 37 Labs (4/18): K 4.9, creatinine 1.35, hgb 10.5 Labs (1/19): creatinine 2.56 Labs (3/20): K 4, creatinine 1.88, hgb 10.2, HIV negative  Labs (7/20): K 4.7, creatinine 2.46 Labs (5/21): K 4, creatinine 2.04, LDL 122 Labs (10/21): Creatinine 1.8  Labs (7/22): K 4.2, creatinine 2.06 Labs (6/23): K 4.3, creatinine 2.02 Labs (9/23): K 4.0, creatinine 1.75, normal LFTs, LDL 80, HDL 26 Labs (8/24): K 3.9, creatinine 2.41    Past Medical History: 1. Diabetes mellitus.  Has history of diabetic foot ulcer and diabetic peripheral neuropathy. .  2. Hyperlipidemia 3. Hypertension: ACEI cough.  4. Colon cancer:  s/p sigmoid colectomy in 2007 5. Morbid obesity s/p lap band procedure.  6. Anemia 7. Obstructive Sleep Apnea: He does not have CPAP.  8. CHF: Diastolic.  Echo (5/12): EF 50-55%, mild LV dilation, mild LVH, moderate (grade II) diastolic dysfunction.  Echo (9/14) with EF 50-55%, mild LVH, moderately dilated LV, RV poorly visualized. - Echo (6/19): EF 55-60%, RV poorly visualized.  - Echo (10/23): EF 45%, global hypokinesis, mild RV enlargement with mildly decreased RV systolic function.   9. PFTs (9/10): mild obstructive defect 10.  Dobutamine stress echo (9/10): EF 55%, mild LV hypertrophy, no significant valvular disease, no ischemia or infarction  11. CKD stage 3 12. Ventral hernia s/p repair.  13. Gout  Current Outpatient Medications  Medication Sig Dispense Refill   Accu-Chek Softclix Lancets lancets CHECK BLOOD SUGAR 2 TIMES  DAILY 300 each 3   Albuterol Sulfate (PROAIR RESPICLICK) 108 (90 Base) MCG/ACT AEPB Inhale 1 puff into the lungs 4 (four) times daily as needed. 3 each 1   allopurinol (ZYLOPRIM) 100 MG tablet Take 1 tablet (100 mg total) by mouth daily. 30 tablet 2   aspirin EC 81 MG tablet Take 1 tablet (81 mg total) by mouth daily. 90 tablet 1   atorvastatin (LIPITOR) 40 MG tablet Take 1 tablet (40 mg total) by mouth daily. 90 tablet 1   bisoprolol (ZEBETA) 10 MG tablet Take 1 tablet (10 mg total) by mouth every  evening. 90 tablet 1   blood glucose meter kit and supplies KIT Inject 1 each into the skin 4 (four) times daily. Use to check blood sugar three times daily. Dispense Freestyle Libre according to insurance preference. DX: E11.8 2 each 1   blood glucose meter kit and supplies Dispense based on patient and insurance preference. Use up to four times daily as directed. (FOR ICD-10 E10.9, E11.9). 1 each 0   Blood Glucose Monitoring Suppl (ACCU-CHEK AVIVA CONNECT) w/Device KIT 1 Act by Does not apply route 3 (three) times daily as needed. 1 kit 3   colchicine 0.6 MG tablet Take  0.6 mg by mouth daily.     Continuous Blood Gluc Sensor (FREESTYLE LIBRE 2 SENSOR) MISC 1 Act by Does not apply route daily. 2 each 5   empagliflozin (JARDIANCE) 10 MG TABS tablet Take 1 tablet (10 mg total) by mouth daily before breakfast. 90 tablet 3   ferrous sulfate 325 (65 FE) MG tablet Take 1 tablet (325 mg total) by mouth 2 (two) times daily with a meal. 180 tablet 1   furosemide (LASIX) 20 MG tablet Take 5 tablets (100 mg total) by mouth 2 (two) times daily. 120 tablet 1   glucose blood (ACCU-CHEK AVIVA PLUS) test strip Use to test blood sugar twice daily. DX E11.8 200 strip 3   HYDROcodone-acetaminophen (NORCO) 10-325 MG tablet Take 1 tablet by mouth every 6 (six) hours as needed.     insulin detemir (LEVEMIR) 100 UNIT/ML injection Inject 60 Units into the skin daily.     Insulin Pen Needle (ULTICARE SHORT PEN NEEDLES) 31G X 8 MM MISC USE TWO PEN NEEDLES DAILY WITH INSULIN PENS 200 each 2   levothyroxine (SYNTHROID) 75 MCG tablet Take 75 mcg by mouth daily.     losartan (COZAAR) 25 MG tablet TAKE 2 TABLETS BY MOUTH DAILY. 60 tablet 3   metolazone (ZAROXOLYN) 2.5 MG tablet TAKE 1 TABLET (2.5 MG TOTAL) BY MOUTH ONCE A WEEK. EVERY TUESDAY 10 tablet 3   Multiple Vitamins-Minerals (MULTIVITAMIN ADULT PO) Take 1 tablet by mouth daily.     nystatin (MYCOSTATIN/NYSTOP) powder Apply 1 application topically 3 (three) times daily. 60 g 5   potassium chloride SA (KLOR-CON M) 20 MEQ tablet TAKE 1 TABLET BY MOUTH ONCE A WEEK. EVERY TUESDAY 10 tablet 3   RESTASIS 0.05 % ophthalmic emulsion PLACE 1 DROP INTO BOTH EYES TWICE A DAY 60 each 5   No current facility-administered medications for this encounter.    Allergies:   Shellfish allergy, Lisinopril, Relistor [methylnaltrexone bromide], and Lovastatin   Social History:  The patient  reports that he has never smoked. He has never used smokeless tobacco. He reports that he does not currently use alcohol. He reports that he does not use drugs.    Family History:  The patient's family history includes CAD in his mother; Congestive Heart Failure in his mother; Diabetes in his father and mother; Heart attack (age of onset: 69) in his brother; Heart failure in his brother; Hepatitis in his mother.   ROS:  Please see the history of present illness.   All other systems are personally reviewed and negative.   Exam:   BP 104/60   Pulse 62   Wt (!) 184.6 kg (407 lb)   SpO2 96%   BMI 53.70 kg/m  General: NAD, obese.  Neck: Thick. No JVD, no thyromegaly or thyroid nodule.  Lungs: Clear to auscultation bilaterally with normal respiratory effort. CV: Nondisplaced PMI.  Heart regular S1/S2, no S3/S4, no murmur.  1+ edema 1/3 to knees bilaterally.  No carotid bruit.  Normal pedal pulses.  Abdomen: Soft, nontender, no hepatosplenomegaly, no distention.  Skin: Intact without lesions or rashes.  Neurologic: Alert and oriented x 3.  Psych: Normal affect. Extremities: No clubbing or cyanosis.  HEENT: Normal.   Recent Labs: 07/11/2023: B Natriuretic Peptide 93.3; BUN 57; Creatinine, Ser 2.08; Potassium 3.7; Sodium 139  Personally reviewed   Wt Readings from Last 3 Encounters:  07/11/23 (!) 184.6 kg (407 lb)  12/04/22 (!) 179.2 kg (395 lb)  06/28/22 (!) 189.8 kg (418 lb 6.4 oz)    ASSESSMENT AND PLAN: 1. Chronic HF with mid range EF: Echo in 10/23 showed EF 45%, global hypokinesis, mild RV enlargement with mildly decreased RV systolic function.  NYHA class III, symptoms confounded by obesity/deconditioning. I do not think that he si significantly volume overloaded.  Weight is trending down.  - Continue Lasix 100 mg bid + metolazone 2.5 mg qwk on Tuesdays with KCl 20 mEq on Tuesdays.  BMET/BNP today. - Continue bisoprolol 10 mg daily. - Continue losartan 50 mg daily. With elevated creatinine, I will not switch to Entresto at this time.  - Continue Jardiance 10 mg daily. - I will arrange for repeat echo.  2. Obesity: Had Lap band but  regained weight during COVID. Body mass index is 53.7 kg/m. - Refer to pharmacy clinic for semaglutide or tirzepatide.  3. HTN: Controlled today.  4. OSA: Not using CPAP, says it was never sent to him.  - Will check with Dr. Tresa Endo to see if we can get CPAP for him.  5. CKD: Stage 3b.  - BMET today.  - Avoid NSAIDs - Sees nephrology.  6. Hyperlipidemia: On atorvastatin.  - Check lipids today.   Follow up in 3 months with APP.   Signed, Marca Ancona, MD  07/11/2023   [facility] [address] [facility] [place] [address] [phone] (office) [phone] (fax)

## 2023-07-11 NOTE — Patient Instructions (Addendum)
There has been no changes to your medications.  Labs done today, your results will be available in MyChart, we will contact you for abnormal readings.  Your physician has requested that you have an echocardiogram. Echocardiography is a painless test that uses sound waves to create images of your heart. It provides your doctor with information about the size and shape of your heart and how well your heart's chambers and valves are working. This procedure takes approximately one hour. There are no restrictions for this procedure. Please do NOT wear cologne, perfume, aftershave, or lotions (deodorant is allowed). Please arrive 15 minutes prior to your appointment time.  You have been referred to the HEART CARE PHARMACY TEAM. They will call you to arrange your appointment.   Your physician recommends that you schedule a follow-up appointment in: 3 months.  If you have any questions or concerns before your next appointment please send [place] a message through [place] or call our office at [phone].    TO LEAVE A MESSAGE FOR THE NURSE SELECT OPTION 2, PLEASE LEAVE A MESSAGE INCLUDING: YOUR NAME DATE OF BIRTH CALL BACK NUMBER REASON FOR CALL**this is important as we prioritize the call backs  YOU WILL RECEIVE A CALL BACK THE SAME DAY AS LONG AS YOU CALL BEFORE 4:00 PM  At the [facility], you and your health needs are our priority. As part of our continuing mission to provide you with exceptional heart care, we have created designated Provider Care Teams. These Care Teams include your primary Cardiologist (physician) and Advanced Practice Providers (APPs- Physician Assistants and Nurse Practitioners) who all work together to provide you with the care you need, when you need it.   You may see any of the following providers on your designated Care Team at your next follow up: Dr Arvilla Meres Dr Marca Ancona Dr. Dorthula Nettles Dr. Clearnce Hasten Amy Filbert Schilder, NP Robbie Lis, [place] [facility] [place], [place] Brynda Peon, NP [place] Lee, NP Karle Plumber, PharmD   Please be sure to bring in all your medications bottles to every appointment.    Thank you for choosing [facility]

## 2023-07-13 ENCOUNTER — Telehealth (HOSPITAL_COMMUNITY): Payer: Self-pay

## 2023-07-13 DIAGNOSIS — E785 Hyperlipidemia, unspecified: Secondary | ICD-10-CM

## 2023-07-13 MED ORDER — ATORVASTATIN CALCIUM 80 MG PO TABS: 80.0000 mg | ORAL_TABLET | Freq: Every day | ORAL | 3 refills | Status: AC

## 2023-07-13 NOTE — Telephone Encounter (Addendum)
Pt aware, agreeable, and verbalized understanding  Labs ordered and scheduled,  Rx updated   ----- Message from Marca Ancona sent at 07/11/2023  3:57 PM EDT ----- Increase atorvastatin to 80 mg daily with lipids/LFTs in 2 months.

## 2023-07-25 ENCOUNTER — Ambulatory Visit (HOSPITAL_COMMUNITY): Payer: 59

## 2023-08-08 ENCOUNTER — Encounter: Payer: Self-pay | Admitting: Podiatry

## 2023-08-18 ENCOUNTER — Ambulatory Visit: Payer: 59 | Admitting: Podiatry

## 2023-09-04 ENCOUNTER — Other Ambulatory Visit (HOSPITAL_COMMUNITY): Payer: 59

## 2023-10-10 ENCOUNTER — Telehealth (HOSPITAL_COMMUNITY): Payer: Self-pay

## 2023-10-10 NOTE — Telephone Encounter (Signed)
Called and left patient a voice message to confirm/remind patient of their appointment at the [facility] on 10/11/23.   And to bring in all medications and/or complete list.

## 2023-10-11 ENCOUNTER — Encounter (HOSPITAL_COMMUNITY): Payer: 59

## 2023-10-12 ENCOUNTER — Telehealth (HOSPITAL_COMMUNITY): Payer: Self-pay

## 2023-10-12 NOTE — Telephone Encounter (Signed)
Called and spoke to patient's nephew Genevie Cheshire to confirm/remind patient of their appointment at the [facility] on 10/13/23.   Patient reminded to bring all medications and/or complete list.  Confirmed patient has transportation. Gave directions, instructed to utilize valet parking.  Confirmed appointment prior to ending call.

## 2023-10-13 ENCOUNTER — Ambulatory Visit (HOSPITAL_COMMUNITY)
Admission: RE | Admit: 2023-10-13 | Discharge: 2023-10-13 | Disposition: A | Discharge status: Home / Self Care | Payer: 59 | Source: Ambulatory Visit | Attending: Family Medicine

## 2023-10-13 ENCOUNTER — Encounter (HOSPITAL_COMMUNITY): Payer: Self-pay

## 2023-10-13 VITALS — BP 90/56 | HR 62 | Wt >= 6400 oz

## 2023-10-13 DIAGNOSIS — N1832 Chronic kidney disease, stage 3b: Secondary | ICD-10-CM | POA: Diagnosis not present

## 2023-10-13 DIAGNOSIS — Z6841 Body Mass Index (BMI) 40.0 and over, adult: Secondary | ICD-10-CM | POA: Diagnosis not present

## 2023-10-13 DIAGNOSIS — I5040 Unspecified combined systolic (congestive) and diastolic (congestive) heart failure: Secondary | ICD-10-CM | POA: Insufficient documentation

## 2023-10-13 DIAGNOSIS — E785 Hyperlipidemia, unspecified: Secondary | ICD-10-CM | POA: Diagnosis not present

## 2023-10-13 DIAGNOSIS — Z7984 Long term (current) use of oral hypoglycemic drugs: Secondary | ICD-10-CM | POA: Insufficient documentation

## 2023-10-13 DIAGNOSIS — I10 Essential (primary) hypertension: Secondary | ICD-10-CM

## 2023-10-13 DIAGNOSIS — E1122 Type 2 diabetes mellitus with diabetic chronic kidney disease: Secondary | ICD-10-CM | POA: Diagnosis not present

## 2023-10-13 DIAGNOSIS — G4733 Obstructive sleep apnea (adult) (pediatric): Secondary | ICD-10-CM | POA: Diagnosis not present

## 2023-10-13 DIAGNOSIS — I130 Hypertensive heart and chronic kidney disease with heart failure and stage 1 through stage 4 chronic kidney disease, or unspecified chronic kidney disease: Secondary | ICD-10-CM | POA: Insufficient documentation

## 2023-10-13 DIAGNOSIS — Z794 Long term (current) use of insulin: Secondary | ICD-10-CM | POA: Insufficient documentation

## 2023-10-13 DIAGNOSIS — Z79899 Other long term (current) drug therapy: Secondary | ICD-10-CM | POA: Diagnosis not present

## 2023-10-13 DIAGNOSIS — I5022 Chronic systolic (congestive) heart failure: Secondary | ICD-10-CM

## 2023-10-13 LAB — COMPREHENSIVE METABOLIC PANEL
ALT: 21 U/L (ref 0–44)
AST: 22 U/L (ref 15–41)
Albumin: 3.4 g/dL — ABNORMAL LOW (ref 3.5–5.0)
Alkaline Phosphatase: 90 U/L (ref 38–126)
Anion gap: 11 (ref 5–15)
BUN: 67 mg/dL — ABNORMAL HIGH (ref 8–23)
CO2: 31 mmol/L (ref 22–32)
Calcium: 9.3 mg/dL (ref 8.9–10.3)
Chloride: 97 mmol/L — ABNORMAL LOW (ref 98–111)
Creatinine, Ser: 3.09 mg/dL — ABNORMAL HIGH (ref 0.61–1.24)
GFR, Estimated: 22 mL/min — ABNORMAL LOW (ref >60)
Glucose, Bld: 70 mg/dL (ref 70–99)
Potassium: 3.5 mmol/L (ref 3.5–5.1)
Sodium: 139 mmol/L (ref 135–145)
Total Bilirubin: 0.4 mg/dL (ref 0.0–1.2)
Total Protein: 7.3 g/dL (ref 6.5–8.1)

## 2023-10-13 LAB — LIPID PANEL
Cholesterol: 150 mg/dL (ref 0–200)
HDL: 32 mg/dL — ABNORMAL LOW (ref >40)
LDL Cholesterol: 94 mg/dL (ref 0–99)
Total CHOL/HDL Ratio: 4.7 {ratio}
Triglycerides: 120 mg/dL (ref <150)
VLDL: 24 mg/dL (ref 0–40)

## 2023-10-13 LAB — BRAIN NATRIURETIC PEPTIDE: B Natriuretic Peptide: 32.9 pg/mL (ref 0.0–100.0)

## 2023-10-13 MED ORDER — LOSARTAN POTASSIUM 25 MG PO TABS: 25.0000 mg | ORAL_TABLET | Freq: Every day | ORAL | 3 refills | Status: DC

## 2023-10-13 NOTE — Progress Notes (Signed)
ID:  [name], DOB 07-Jan-1956, MRN [id number]   Provider location: [facility] Type of Visit: Established patient   PCP:  Estevan Oaks, NP  HF Cardiologist:  Dr. Shirlee Latch  CC: HF follow up   HPI [name] is a 68 y.o. male who has history of morbid obesity, diabetes II, and diastolic CHF. S/P Lap band procedure years ago.   Echo in 10/23 showed EF 45%, global hypokinesis, mild RV enlargement with mildly decreased RV systolic function.  Sleep study in 1/24 showed severe OSA but patient never got CPAP.   Today he returns for HF follow up. Overall feeling fine. He is [facility] the [facility] 3 days/week. He has SOB with swimming exercises. He uses a walker when ambulating in his house and an electric wheelchair when outside his home. Has swelling and heaviness in L leg. Denies palpitations, CP, dizziness, or PND/Orthopnea. Appetite ok. No fever or chills. He does not weigh at home. Taking all medications. Not wearing Bipap, says he never received it.   ECG (personally reviewed): none ordered today.  Labs (9/23): K 4.0, creatinine 1.75, normal LFTs, LDL 80, HDL 26 Labs (8/24): K 3.9, creatinine 2.41 Labs (10/24): K 3.7, creatinine 2.08, LDL 97    Past Medical History: 1. Diabetes mellitus.  Has history of diabetic foot ulcer and diabetic peripheral neuropathy. .  2. Hyperlipidemia 3. Hypertension: ACEI cough.  4. Colon cancer: s/p sigmoid colectomy in 2007 5. Morbid obesity s/p lap band procedure.  6. Anemia 7. Obstructive Sleep Apnea: He does not have CPAP.  8. CHF: Diastolic.  Echo (5/12): EF 50-55%, mild LV dilation, mild LVH, moderate (grade II) diastolic dysfunction.  Echo (9/14) with EF 50-55%, mild LVH, moderately dilated LV, RV poorly visualized. - Echo (6/19): EF 55-60%, RV poorly visualized.  - Echo (10/23): EF 45%, global hypokinesis, mild RV enlargement with mildly decreased RV systolic function.   9. PFTs (9/10): mild obstructive  defect 10.  Dobutamine stress echo (9/10): EF 55%, mild LV hypertrophy, no significant valvular disease, no ischemia or infarction  11. CKD stage 3 12. Ventral hernia s/p repair.  13. Gout  Current Outpatient Medications  Medication Sig Dispense Refill   Accu-Chek Softclix Lancets lancets CHECK BLOOD SUGAR 2 TIMES  DAILY 300 each 3   Albuterol Sulfate (PROAIR RESPICLICK) 108 (90 Base) MCG/ACT AEPB Inhale 1 puff into the lungs 4 (four) times daily as needed. 3 each 1   allopurinol (ZYLOPRIM) 100 MG tablet Take 1 tablet (100 mg total) by mouth daily. 30 tablet 2   aspirin EC 81 MG tablet Take 1 tablet (81 mg total) by mouth daily. 90 tablet 1   atorvastatin (LIPITOR) 80 MG tablet Take 1 tablet (80 mg total) by mouth daily. 90 tablet 3   bisoprolol (ZEBETA) 10 MG tablet Take 1 tablet (10 mg total) by mouth every evening. 90 tablet 1   blood glucose meter kit and supplies KIT Inject 1 each into the skin 4 (four) times daily. Use to check blood sugar three times daily. Dispense Freestyle Libre according to insurance preference. DX: E11.8 2 each 1   blood glucose meter kit and supplies Dispense based on patient and insurance preference. Use up to four times daily as directed. (FOR ICD-10 E10.9, E11.9). 1 each 0   Blood Glucose Monitoring Suppl (ACCU-CHEK AVIVA CONNECT) w/Device KIT 1 Act by Does not apply route 3 (three) times daily as needed. 1 kit 3   colchicine  0.6 MG tablet Take 0.6 mg by mouth daily.     Continuous Blood Gluc Sensor (FREESTYLE LIBRE 2 SENSOR) MISC 1 Act by Does not apply route daily. 2 each 5   empagliflozin (JARDIANCE) 10 MG TABS tablet Take 1 tablet (10 mg total) by mouth daily before breakfast. 90 tablet 3   ferrous sulfate 325 (65 FE) MG tablet Take 1 tablet (325 mg total) by mouth 2 (two) times daily with a meal. 180 tablet 1   furosemide (LASIX) 20 MG tablet Take 5 tablets (100 mg total) by mouth 2 (two) times daily. 120 tablet 1   glucose blood (ACCU-CHEK AVIVA PLUS) test  strip Use to test blood sugar twice daily. DX E11.8 200 strip 3   HYDROcodone-acetaminophen (NORCO) 10-325 MG tablet Take 1 tablet by mouth every 6 (six) hours as needed.     insulin detemir (LEVEMIR) 100 UNIT/ML injection Inject 60 Units into the skin daily.     Insulin Pen Needle (ULTICARE SHORT PEN NEEDLES) 31G X 8 MM MISC USE TWO PEN NEEDLES DAILY WITH INSULIN PENS 200 each 2   levothyroxine (SYNTHROID) 75 MCG tablet Take 75 mcg by mouth daily.     losartan (COZAAR) 25 MG tablet TAKE 2 TABLETS BY MOUTH DAILY. 60 tablet 3   Magnesium Hydroxide (DULCOLAX PO) Take 1 tablet by mouth daily.     metolazone (ZAROXOLYN) 2.5 MG tablet TAKE 1 TABLET (2.5 MG TOTAL) BY MOUTH ONCE A WEEK. EVERY TUESDAY 10 tablet 3   Multiple Vitamins-Minerals (MULTIVITAMIN ADULT PO) Take 1 tablet by mouth daily.     potassium chloride SA (KLOR-CON M) 20 MEQ tablet TAKE 1 TABLET BY MOUTH ONCE A WEEK. EVERY TUESDAY 10 tablet 3   RESTASIS 0.05 % ophthalmic emulsion PLACE 1 DROP INTO BOTH EYES TWICE A DAY 60 each 5   No current facility-administered medications for this encounter.    Allergies:   Shellfish allergy, Lisinopril, Relistor [methylnaltrexone bromide], and Lovastatin   Social History:  The patient  reports that he has never smoked. He has never used smokeless tobacco. He reports that he does not currently use alcohol. He reports that he does not use drugs.   Family History:  The patient's family history includes CAD in his mother; Congestive Heart Failure in his mother; Diabetes in his father and mother; Heart attack (age of onset: 36) in his brother; Heart failure in his brother; Hepatitis in his mother.   ROS:  Please see the history of present illness.   All other systems are personally reviewed and negative.   Wt Readings from Last 3 Encounters:  10/13/23 (!) 181.4 kg (400 lb)  07/11/23 (!) 184.6 kg (407 lb)  12/04/22 (!) 179.2 kg (395 lb)   BP (!) 90/56   Pulse 62   Wt (!) 181.4 kg (400 lb)   SpO2  94%   BMI 52.77 kg/m   Physical Exam:   General:  NAD. No resp difficulty, arrived in electric WC HEENT: Normal Neck: Supple. No JVD, thick neck. Carotids 2+ bilat; no bruits. No lymphadenopathy or thryomegaly appreciated. Cor: PMI nondisplaced. Regular rate & rhythm. No rubs, gallops or murmurs. Lungs: Clea Abdomen: Soft, morbidly obese, nontender, nondistended. No hepatosplenomegaly. No bruits or masses. Good bowel sounds. Extremities: No cyanosis, clubbing, rash, edema Neuro: Alert & oriented x 3, cranial nerves grossly intact. Moves all 4 extremities w/o difficulty. Affect pleasant.  Assessment & Plan 1. Chronic HF with mid range EF: Echo in 10/23 showed EF 45%, global hypokinesis,  mild RV enlargement with mildly decreased RV systolic function.  NYHA class III, symptoms confounded by obesity/deconditioning. I do not think that he is significantly volume overloaded.  Weight is trending down.  - BP is soft, reduce losartan to 25 mg daily. (No Entresto with elevated SCr) - Continue Lasix 100 mg bid + metolazone 2.5 mg qwk on Tuesdays with KCl 20 mEq on Tuesdays. BMET/BNP today. - Continue bisoprolol 10 mg daily. - Continue Jardiance 10 mg daily. - I will arrange for repeat echo.  2. Obesity: Had Lap band but regained weight during COVID. Body mass index is 52.77 kg/m. - We discussed GLP1 today, he declines. 3. HTN: BP low - Decrease losartan as above. - Given Rx for BP cuff for home checks.  4. OSA: Not using BiPAP, says it was never sent to him. He is agreeable to using machine. - Refer back to sleep medicine to discuss. 5. CKD: Stage 3b. Sees nephrology.  - Continue SGLT2i. BMET today. - Avoid hypotension, reducing ARB as above. 6. Hyperlipidemia: On atorvastatin 80 mg daily. - Check LFTs and lipids today.    Follow up in 3 months with Dr. Shirlee Latch. Update echo soon.  Signed, Jacklynn Ganong, FNP  10/13/2023   [facility] [address] [facility] [place] [address] [phone] (office) [phone] (fax)

## 2023-10-13 NOTE — Patient Instructions (Signed)
DECREASE Losartan to 25 mg daily.  Labs done today, your results will be available in MyChart, we will contact you for abnormal readings.  Your physician has requested that you have an echocardiogram. Echocardiography is a painless test that uses sound waves to create images of your heart. It provides your doctor with information about the size and shape of your heart and how well your heart's chambers and valves are working. This procedure takes approximately one hour. There are no restrictions for this procedure. Please do NOT wear cologne, perfume, aftershave, or lotions (deodorant is allowed). Please arrive 15 minutes prior to your appointment time.  Please note: We ask at that you not bring children with you during ultrasound (echo/ vascular) testing. Due to room size and safety concerns, children are not allowed in the ultrasound rooms during exams. Our front office staff cannot provide observation of children in our lobby area while testing is being conducted. An adult accompanying a patient to their appointment will only be allowed in the ultrasound room at the discretion of the ultrasound technician under special circumstances. We apologize for any inconvenience.  Your physician recommends that you schedule a follow-up appointment in: 4 months ( May) ** PLEASE CALL THE OFFICE IN MARCH TO ARRANGE YOUR FOLLOW UP APPOINTMENT.**  If you have any questions or concerns before your next appointment please send [place] a message through [place] or call our office at [phone].    TO LEAVE A MESSAGE FOR THE NURSE SELECT OPTION 2, PLEASE LEAVE A MESSAGE INCLUDING: YOUR NAME DATE OF BIRTH CALL BACK NUMBER REASON FOR CALL**this is important as we prioritize the call backs  YOU WILL RECEIVE A CALL BACK THE SAME DAY AS LONG AS YOU CALL BEFORE 4:00 PM  At the [facility], you and your health needs are our priority. As part of our continuing mission to provide you with exceptional  heart care, we have created designated Provider Care Teams. These Care Teams include your primary Cardiologist (physician) and Advanced Practice Providers (APPs- Physician Assistants and Nurse Practitioners) who all work together to provide you with the care you need, when you need it.   You may see any of the following providers on your designated Care Team at your next follow up: Dr Arvilla Meres Dr Marca Ancona Dr. Dorthula Nettles Dr. Clearnce Hasten Amy Filbert Schilder, NP Robbie Lis, [place] [facility] [place], [place] Brynda Peon, NP [place] Lee, NP Karle Plumber, PharmD   Please be sure to bring in all your medications bottles to every appointment.    Thank you for choosing [facility]

## 2023-10-18 ENCOUNTER — Telehealth (HOSPITAL_COMMUNITY): Payer: Self-pay | Admitting: *Deleted

## 2023-10-18 DIAGNOSIS — I5022 Chronic systolic (congestive) heart failure: Secondary | ICD-10-CM

## 2023-10-18 NOTE — Telephone Encounter (Signed)
Called patient per Prince Rome, NP with following lab results and message:  "Renal function worse. We reduced his losartan dose at visit today. Hold Lasix x 2 days, then may resume at home dose to 100 bid, no further metolazone until repeat labs. Repeat  BMET next week."  Pt verbalized understanding of above. He confirmed he has reduced his losartan dose, will hold Lasix for 2 days then resume 100 mg twice daily. He will hold his metolazone next Tuesday and come for repeat labs next week. Lab ordered and scheduled.

## 2023-10-26 ENCOUNTER — Telehealth (HOSPITAL_COMMUNITY): Payer: Self-pay | Admitting: Cardiology

## 2023-10-26 NOTE — Telephone Encounter (Signed)
 10/26/2023 at 10:11, Called patient and left message to confirm lab appointment on 10/27/2023

## 2023-10-27 ENCOUNTER — Ambulatory Visit (HOSPITAL_COMMUNITY)
Admission: RE | Admit: 2023-10-27 | Discharge: 2023-10-27 | Disposition: A | Discharge status: Home / Self Care | Payer: 59 | Source: Ambulatory Visit | Attending: Internal Medicine | Admitting: Internal Medicine

## 2023-10-27 ENCOUNTER — Ambulatory Visit (HOSPITAL_COMMUNITY)
Admission: RE | Admit: 2023-10-27 | Discharge: 2023-10-27 | Disposition: A | Discharge status: Home / Self Care | Payer: 59 | Source: Ambulatory Visit | Attending: Family Medicine | Admitting: Family Medicine

## 2023-10-27 DIAGNOSIS — I5022 Chronic systolic (congestive) heart failure: Secondary | ICD-10-CM | POA: Insufficient documentation

## 2023-10-27 LAB — ECHOCARDIOGRAM COMPLETE
AR max vel: 2.05 cm2
AV Area VTI: 1.93 cm2
AV Area mean vel: 2.08 cm2
AV Mean grad: 6 mm[Hg]
AV Peak grad: 10 mm[Hg]
Ao pk vel: 1.58 m/s
Area-P 1/2: 3.34 cm2
MV VTI: 2.73 cm2
S' Lateral: 4.7 cm

## 2023-10-27 LAB — BASIC METABOLIC PANEL
Anion gap: 10 (ref 5–15)
BUN: 57 mg/dL — ABNORMAL HIGH (ref 8–23)
CO2: 27 mmol/L (ref 22–32)
Calcium: 8.7 mg/dL — ABNORMAL LOW (ref 8.9–10.3)
Chloride: 101 mmol/L (ref 98–111)
Creatinine, Ser: 2.59 mg/dL — ABNORMAL HIGH (ref 0.61–1.24)
GFR, Estimated: 27 mL/min — ABNORMAL LOW (ref >60)
Glucose, Bld: 141 mg/dL — ABNORMAL HIGH (ref 70–99)
Potassium: 3.7 mmol/L (ref 3.5–5.1)
Sodium: 138 mmol/L (ref 135–145)

## 2023-10-27 NOTE — Progress Notes (Signed)
*  PRELIMINARY RESULTS* Echocardiogram 2D Echocardiogram has been performed.  [name] 10/27/2023, 10:58 AM

## 2023-11-10 ENCOUNTER — Inpatient Hospital Stay (HOSPITAL_COMMUNITY)
Admission: EM | Admit: 2023-11-10 | Discharge: 2023-11-13 | DRG: 394 | Disposition: A | Discharge status: Home / Self Care | Payer: 59 | Attending: Internal Medicine | Admitting: Internal Medicine

## 2023-11-10 ENCOUNTER — Emergency Department (HOSPITAL_COMMUNITY): Payer: 59

## 2023-11-10 ENCOUNTER — Inpatient Hospital Stay (HOSPITAL_COMMUNITY): Payer: 59

## 2023-11-10 ENCOUNTER — Ambulatory Visit: Payer: 59 | Admitting: Podiatry

## 2023-11-10 ENCOUNTER — Other Ambulatory Visit: Payer: Self-pay

## 2023-11-10 DIAGNOSIS — Z6841 Body Mass Index (BMI) 40.0 and over, adult: Secondary | ICD-10-CM | POA: Diagnosis not present

## 2023-11-10 DIAGNOSIS — E785 Hyperlipidemia, unspecified: Secondary | ICD-10-CM | POA: Diagnosis present

## 2023-11-10 DIAGNOSIS — B351 Tinea unguium: Secondary | ICD-10-CM

## 2023-11-10 DIAGNOSIS — Z7984 Long term (current) use of oral hypoglycemic drugs: Secondary | ICD-10-CM

## 2023-11-10 DIAGNOSIS — E1122 Type 2 diabetes mellitus with diabetic chronic kidney disease: Secondary | ICD-10-CM | POA: Diagnosis present

## 2023-11-10 DIAGNOSIS — E1142 Type 2 diabetes mellitus with diabetic polyneuropathy: Secondary | ICD-10-CM | POA: Diagnosis present

## 2023-11-10 DIAGNOSIS — Z515 Encounter for palliative care: Secondary | ICD-10-CM

## 2023-11-10 DIAGNOSIS — E1151 Type 2 diabetes mellitus with diabetic peripheral angiopathy without gangrene: Secondary | ICD-10-CM | POA: Diagnosis present

## 2023-11-10 DIAGNOSIS — J479 Bronchiectasis, uncomplicated: Secondary | ICD-10-CM | POA: Diagnosis present

## 2023-11-10 DIAGNOSIS — M79675 Pain in left toe(s): Secondary | ICD-10-CM

## 2023-11-10 DIAGNOSIS — Z794 Long term (current) use of insulin: Secondary | ICD-10-CM | POA: Diagnosis not present

## 2023-11-10 DIAGNOSIS — Z91013 Allergy to seafood: Secondary | ICD-10-CM | POA: Diagnosis not present

## 2023-11-10 DIAGNOSIS — Z79890 Hormone replacement therapy (postmenopausal): Secondary | ICD-10-CM

## 2023-11-10 DIAGNOSIS — Z66 Do not resuscitate: Secondary | ICD-10-CM | POA: Diagnosis present

## 2023-11-10 DIAGNOSIS — K56609 Unspecified intestinal obstruction, unspecified as to partial versus complete obstruction: Principal | ICD-10-CM | POA: Diagnosis present

## 2023-11-10 DIAGNOSIS — N189 Chronic kidney disease, unspecified: Secondary | ICD-10-CM

## 2023-11-10 DIAGNOSIS — Z7982 Long term (current) use of aspirin: Secondary | ICD-10-CM | POA: Diagnosis not present

## 2023-11-10 DIAGNOSIS — Z947 Corneal transplant status: Secondary | ICD-10-CM

## 2023-11-10 DIAGNOSIS — I130 Hypertensive heart and chronic kidney disease with heart failure and stage 1 through stage 4 chronic kidney disease, or unspecified chronic kidney disease: Secondary | ICD-10-CM | POA: Diagnosis present

## 2023-11-10 DIAGNOSIS — Z833 Family history of diabetes mellitus: Secondary | ICD-10-CM

## 2023-11-10 DIAGNOSIS — N400 Enlarged prostate without lower urinary tract symptoms: Secondary | ICD-10-CM | POA: Diagnosis present

## 2023-11-10 DIAGNOSIS — Z8249 Family history of ischemic heart disease and other diseases of the circulatory system: Secondary | ICD-10-CM

## 2023-11-10 DIAGNOSIS — M79674 Pain in right toe(s): Secondary | ICD-10-CM

## 2023-11-10 DIAGNOSIS — M109 Gout, unspecified: Secondary | ICD-10-CM | POA: Diagnosis present

## 2023-11-10 DIAGNOSIS — K420 Umbilical hernia with obstruction, without gangrene: Principal | ICD-10-CM | POA: Diagnosis present

## 2023-11-10 DIAGNOSIS — Z85038 Personal history of other malignant neoplasm of large intestine: Secondary | ICD-10-CM

## 2023-11-10 DIAGNOSIS — N1832 Chronic kidney disease, stage 3b: Secondary | ICD-10-CM | POA: Diagnosis present

## 2023-11-10 DIAGNOSIS — G4733 Obstructive sleep apnea (adult) (pediatric): Secondary | ICD-10-CM | POA: Diagnosis present

## 2023-11-10 DIAGNOSIS — Z9049 Acquired absence of other specified parts of digestive tract: Secondary | ICD-10-CM | POA: Diagnosis not present

## 2023-11-10 DIAGNOSIS — Z79899 Other long term (current) drug therapy: Secondary | ICD-10-CM

## 2023-11-10 DIAGNOSIS — I5032 Chronic diastolic (congestive) heart failure: Secondary | ICD-10-CM | POA: Diagnosis present

## 2023-11-10 DIAGNOSIS — K5904 Chronic idiopathic constipation: Secondary | ICD-10-CM | POA: Diagnosis present

## 2023-11-10 DIAGNOSIS — Z888 Allergy status to other drugs, medicaments and biological substances status: Secondary | ICD-10-CM

## 2023-11-10 LAB — URINALYSIS, ROUTINE W REFLEX MICROSCOPIC
Bilirubin Urine: NEGATIVE
Glucose, UA: 150 mg/dL — AB
Hgb urine dipstick: NEGATIVE
Ketones, ur: NEGATIVE mg/dL
Leukocytes,Ua: NEGATIVE
Nitrite: NEGATIVE
Protein, ur: NEGATIVE mg/dL
Specific Gravity, Urine: 1.014 (ref 1.005–1.030)
pH: 5 (ref 5.0–8.0)

## 2023-11-10 LAB — CBC
HCT: 37.8 % — ABNORMAL LOW (ref 39.0–52.0)
Hemoglobin: 11.5 g/dL — ABNORMAL LOW (ref 13.0–17.0)
MCH: 28.2 pg (ref 26.0–34.0)
MCHC: 30.4 g/dL (ref 30.0–36.0)
MCV: 92.6 fL (ref 80.0–100.0)
Platelets: 171 10*3/uL (ref 150–400)
RBC: 4.08 MIL/uL — ABNORMAL LOW (ref 4.22–5.81)
RDW: 15.6 % — ABNORMAL HIGH (ref 11.5–15.5)
WBC: 5.9 10*3/uL (ref 4.0–10.5)
nRBC: 0 % (ref 0.0–0.2)

## 2023-11-10 LAB — COMPREHENSIVE METABOLIC PANEL
ALT: 25 U/L (ref 0–44)
AST: 23 U/L (ref 15–41)
Albumin: 3.6 g/dL (ref 3.5–5.0)
Alkaline Phosphatase: 107 U/L (ref 38–126)
Anion gap: 11 (ref 5–15)
BUN: 61 mg/dL — ABNORMAL HIGH (ref 8–23)
CO2: 28 mmol/L (ref 22–32)
Calcium: 9.2 mg/dL (ref 8.9–10.3)
Chloride: 102 mmol/L (ref 98–111)
Creatinine, Ser: 2.24 mg/dL — ABNORMAL HIGH (ref 0.61–1.24)
GFR, Estimated: 32 mL/min — ABNORMAL LOW (ref >60)
Glucose, Bld: 112 mg/dL — ABNORMAL HIGH (ref 70–99)
Potassium: 3.5 mmol/L (ref 3.5–5.1)
Sodium: 141 mmol/L (ref 135–145)
Total Bilirubin: 0.5 mg/dL (ref 0.0–1.2)
Total Protein: 7.5 g/dL (ref 6.5–8.1)

## 2023-11-10 LAB — GLUCOSE, CAPILLARY: Glucose-Capillary: 75 mg/dL (ref 70–99)

## 2023-11-10 LAB — LIPASE, BLOOD: Lipase: 21 U/L (ref 11–51)

## 2023-11-10 MED ORDER — INSULIN ASPART 100 UNIT/ML IJ SOLN
0.0000 [IU] | INTRAMUSCULAR | Status: DC
  Administered 2023-11-12: 2 [IU] via SUBCUTANEOUS
  Filled 2023-11-10: qty 0.15

## 2023-11-10 MED ORDER — SODIUM CHLORIDE 0.9 % IV SOLN: INTRAVENOUS | Status: DC

## 2023-11-10 MED ORDER — FENTANYL CITRATE PF 50 MCG/ML IJ SOSY
50.0000 ug | PREFILLED_SYRINGE | Freq: Once | INTRAMUSCULAR | Status: AC
  Administered 2023-11-10: 50 ug via INTRAVENOUS
  Filled 2023-11-10: qty 1

## 2023-11-10 MED ORDER — ACETAMINOPHEN 325 MG PO TABS: 650.0000 mg | ORAL_TABLET | Freq: Four times a day (QID) | ORAL | Status: DC | PRN

## 2023-11-10 MED ORDER — ONDANSETRON HCL 4 MG/2ML IJ SOLN
4.0000 mg | Freq: Four times a day (QID) | INTRAMUSCULAR | Status: DC | PRN
  Filled 2023-11-10: qty 2

## 2023-11-10 MED ORDER — ENOXAPARIN SODIUM 40 MG/0.4ML IJ SOSY: 40.0000 mg | PREFILLED_SYRINGE | INTRAMUSCULAR | Status: DC

## 2023-11-10 MED ORDER — ONDANSETRON HCL 4 MG/2ML IJ SOLN
4.0000 mg | Freq: Once | INTRAMUSCULAR | Status: AC
  Administered 2023-11-10: 4 mg via INTRAVENOUS
  Filled 2023-11-10: qty 2

## 2023-11-10 MED ORDER — ACETAMINOPHEN 650 MG RE SUPP: 650.0000 mg | Freq: Four times a day (QID) | RECTAL | Status: DC | PRN

## 2023-11-10 MED ORDER — SODIUM CHLORIDE 3 % IN NEBU
4.0000 mL | INHALATION_SOLUTION | Freq: Two times a day (BID) | RESPIRATORY_TRACT | Status: DC
  Administered 2023-11-10: 4 mL via RESPIRATORY_TRACT
  Filled 2023-11-10 (×2): qty 4

## 2023-11-10 MED ORDER — SODIUM CHLORIDE 0.9% FLUSH
3.0000 mL | Freq: Two times a day (BID) | INTRAVENOUS | Status: DC
  Administered 2023-11-12 (×2): 3 mL via INTRAVENOUS

## 2023-11-10 MED ORDER — POLYETHYLENE GLYCOL 3350 17 G PO PACK: 17.0000 g | PACK | Freq: Every day | ORAL | Status: DC | PRN

## 2023-11-10 MED ORDER — IOHEXOL 300 MG/ML  SOLN
100.0000 mL | Freq: Once | INTRAMUSCULAR | Status: AC | PRN
  Administered 2023-11-10: 100 mL via INTRAVENOUS

## 2023-11-10 MED ORDER — PANTOPRAZOLE SODIUM 40 MG IV SOLR
40.0000 mg | INTRAVENOUS | Status: DC
  Administered 2023-11-10 – 2023-11-12 (×3): 40 mg via INTRAVENOUS
  Filled 2023-11-10 (×3): qty 10

## 2023-11-10 MED ORDER — MORPHINE SULFATE (PF) 2 MG/ML IV SOLN
2.0000 mg | INTRAVENOUS | Status: DC | PRN
  Administered 2023-11-11 (×3): 2 mg via INTRAVENOUS
  Filled 2023-11-10 (×3): qty 1

## 2023-11-10 NOTE — Assessment & Plan Note (Signed)
Patient baseline creatinine seems to be between 2.0-2.59.  Current creatinine 2.24 seems to be at baseline.  However given patient's current n.p.o. status.  I will maintain the patient on fluids.

## 2023-11-10 NOTE — ED Notes (Signed)
Pt urinated on self. Cleaned pt and applied condom catheter.

## 2023-11-10 NOTE — Assessment & Plan Note (Signed)
This seems to be due to a reducible/incarcerated epigastric incisional hernia.  ER has already engaged Dr. Doylene Canard.  Patient is not actively vomiting, I do not think would benefit from nasogastric tube placement at this time.  Maintain IV fluids.  N.p.o. status.  Pain medication ordered with morphine as needed.  Look forward to surgery evaluation.  Fortunately patient looking nontoxic.  No white count elevation I will check lactic acid.

## 2023-11-10 NOTE — ED Triage Notes (Signed)
BIBA from home for abdominal pain since 6 am, hydocodone taken 3 hrs ago, with no relief. Pt has hx of DM, and umbilical hernia for 3 years. 113/60 BP 108 cbg 97% room air 90 HR

## 2023-11-10 NOTE — H&P (Signed)
History and Physical    Patient: [name] BJY:[id number] DOB: [birth year] DOA: 11/10/2023 DOS: the patient was seen and examined on 11/10/2023 PCP: Estevan Oaks, NP  Patient coming from: Home  Chief Complaint:  Chief Complaint  Patient presents with   Abdominal Pain   HPI: [name] is a 68 y.o. male with medical history significant of multiple abd surgery as listed below.  Patient reports being diagnosed with a abdominal hernia/epigastric hernia from a prior incision in the past.  Unfortunately it has not yet been repaired.  Patient was in his usual state of health till last evening when he had dinner and went to bed.  Since 6 AM this morning patient reports generalized abdominal discomfort, at least 4 episodes of nonbloody clear vomiting.  Not passing gas or bowel movement.  And also the hernia in the epigastric area being not reducible.  Patient came to the ER because of above complaints.  ER provider attempted to reduce the hernia, patient reports it causes increased pain.  Unfortunately the hernia could not be repaired is reduced.   CAT scan of the abdomen shows small bowel obstruction with abrupt caliber transition deep to the superior aspect of the midline incision.  Bilateral lower lobe bronchiectasis with left lateral left lower lobe basilar bronchial mucous plugging.  Mild nodular hepatic contour which can be seen in the setting of cirrhosis.   Review of Systems: As mentioned in the history of present illness. All other systems reviewed and are negative.  Past Medical History:  Diagnosis Date   Anemia    iron   CHF (congestive heart failure) (HCC)    Presumed diastolic. Echo (06/07) w.EF 45%, severe posterior HK, mild LV hypertrophy, No further work-up of abnormal echo was done. pt denies CHF   Colon cancer (HCC) 2007   s/p sigmoid colectomy   Diabetes mellitus    Has Hx of diabetic foot ulcer & peripheral neuropathy  type2   Dyspnea    w/ activity   History of  PFTs 05/2009   Mild Obstructive defect   HTN (hypertension)    Hyperlipidemia    Morbid obesity (HCC)    OSA on CPAP    Pneumonia    week ago    Past Surgical History:  Procedure Laterality Date   BARIATRIC SURGERY  12/2009   Lap Band/At [facility]   COLONOSCOPY  multiple   COLONOSCOPY WITH PROPOFOL N/A 11/26/2012   Procedure: COLONOSCOPY WITH PROPOFOL;  Surgeon: Iva Boop, MD;  Location: [facility];  Service: Endoscopy;  Laterality: N/A;  may need pre appt. with anesthesia due to morbid obesity   COLONOSCOPY WITH PROPOFOL N/A 07/11/2017   Procedure: COLONOSCOPY WITH PROPOFOL;  Surgeon: Iva Boop, MD;  Location: [facility];  Service: Endoscopy;  Laterality: N/A;   CORNEAL TRANSPLANT  1980 & 1984   '80/Right  '84/Left   EYE SURGERY Right 05/2017   HERNIA REPAIR     HIP SURGERY     bilateral   INCISIONAL HERNIA REPAIR N/A 12/18/2013   Procedure:  REPAIR OF INCARCERATED INCISIONAL HERNIA;  Surgeon: Emelia Loron, MD;  Location: [facility] OR;  Service: General;  Laterality: N/A;   INSERTION OF MESH N/A 12/18/2013   Procedure: INSERTION OF MESH;  Surgeon: Emelia Loron, MD;  Location: [facility] OR;  Service: General;  Laterality: N/A;   Sigmoid Colectomy  10/2005   [facility]   TONSILLECTOMY     Social History:  reports that he has never smoked. He has never used  smokeless tobacco. He reports that he does not currently use alcohol. He reports that he does not use drugs.  Allergies  Allergen Reactions   Shellfish Allergy Anaphylaxis   Lisinopril Cough   Relistor [Methylnaltrexone Bromide] Other (See Comments)    confusion   Lovastatin Itching    Family History  Problem Relation Age of Onset   Hepatitis Mother        hepatitis C   Diabetes Mother    Congestive Heart Failure Mother    CAD Mother    Heart attack Brother 59   Diabetes Father    Heart failure Brother    Stomach cancer Neg Hx    Colon cancer Neg Hx     Prior to Admission medications   Medication Sig Start Date  End Date Taking? Authorizing Provider  Accu-Chek Softclix Lancets lancets CHECK BLOOD SUGAR 2 TIMES  DAILY 10/26/21   Etta Grandchild, MD  Albuterol Sulfate (PROAIR RESPICLICK) 108 (90 Base) MCG/ACT AEPB Inhale 1 puff into the lungs 4 (four) times daily as needed. 02/05/21   Etta Grandchild, MD  allopurinol (ZYLOPRIM) 100 MG tablet Take 1 tablet (100 mg total) by mouth daily. 11/30/21   Regalado, Jon Billings A, MD  aspirin EC 81 MG tablet Take 1 tablet (81 mg total) by mouth daily. 04/17/20   Etta Grandchild, MD  atorvastatin (LIPITOR) 80 MG tablet Take 1 tablet (80 mg total) by mouth daily. 07/13/23   Laurey Morale, MD  bisoprolol (ZEBETA) 10 MG tablet Take 1 tablet (10 mg total) by mouth every evening. 04/19/21   Etta Grandchild, MD  blood glucose meter kit and supplies KIT Inject 1 each into the skin 4 (four) times daily. Use to check blood sugar three times daily. Dispense Freestyle Libre according to insurance preference. DX: E11.8 10/26/21   Etta Grandchild, MD  blood glucose meter kit and supplies Dispense based on patient and insurance preference. Use up to four times daily as directed. (FOR ICD-10 E10.9, E11.9). 10/26/21   Etta Grandchild, MD  Blood Glucose Monitoring Suppl (ACCU-CHEK AVIVA CONNECT) w/Device KIT 1 Act by Does not apply route 3 (three) times daily as needed. 10/26/21   Etta Grandchild, MD  colchicine 0.6 MG tablet Take 0.6 mg by mouth daily.    [provider]  Continuous Blood Gluc Sensor (FREESTYLE LIBRE 2 SENSOR) MISC 1 Act by Does not apply route daily. 07/27/21   Etta Grandchild, MD  empagliflozin (JARDIANCE) 10 MG TABS tablet Take 1 tablet (10 mg total) by mouth daily before breakfast. 03/16/23   Laurey Morale, MD  ferrous sulfate 325 (65 FE) MG tablet Take 1 tablet (325 mg total) by mouth 2 (two) times daily with a meal. 10/07/21   Etta Grandchild, MD  furosemide (LASIX) 20 MG tablet Take 5 tablets (100 mg total) by mouth 2 (two) times daily. 11/30/21   Regalado, Belkys A, MD   glucose blood (ACCU-CHEK AVIVA PLUS) test strip Use to test blood sugar twice daily. DX E11.8 10/21/20   Etta Grandchild, MD  HYDROcodone-acetaminophen (NORCO) 10-325 MG tablet Take 1 tablet by mouth every 6 (six) hours as needed. 03/25/22   [provider]  insulin detemir (LEVEMIR) 100 UNIT/ML injection Inject 60 Units into the skin daily.    [provider]  Insulin Pen Needle (ULTICARE SHORT PEN NEEDLES) 31G X 8 MM MISC USE TWO PEN NEEDLES DAILY WITH INSULIN PENS 07/19/21   Etta Grandchild, MD  levothyroxine (SYNTHROID) 75 MCG tablet Take 75 mcg by mouth daily. 11/19/21   [provider]  losartan (COZAAR) 25 MG tablet Take 1 tablet (25 mg total) by mouth daily. 10/13/23   Jacklynn Ganong, FNP  Magnesium Hydroxide (DULCOLAX PO) Take 1 tablet by mouth daily.    [provider]  metolazone (ZAROXOLYN) 2.5 MG tablet TAKE 1 TABLET (2.5 MG TOTAL) BY MOUTH ONCE A WEEK. EVERY TUESDAY 04/11/23   Laurey Morale, MD  Multiple Vitamins-Minerals (MULTIVITAMIN ADULT PO) Take 1 tablet by mouth daily.    [provider]  potassium chloride SA (KLOR-CON M) 20 MEQ tablet TAKE 1 TABLET BY MOUTH ONCE A WEEK. EVERY TUESDAY 04/11/23   Laurey Morale, MD  RESTASIS 0.05 % ophthalmic emulsion PLACE 1 DROP INTO BOTH EYES TWICE A DAY 05/07/21   Etta Grandchild, MD    Physical Exam: Vitals:   11/10/23 1719 11/10/23 1730 11/10/23 1732 11/10/23 1800  BP:  120/70  119/80  Pulse: 61 65  60  Resp:  14  14  Temp: 98.2 F (36.8 C)     TempSrc: Oral     SpO2: 95% 91%  93%  Weight:   (!) 181.4 kg   Height:   6\' 1"  (1.854 m)    General: Morbidly obese obese appearing gentleman does not appear to be distress.  Alert awake.  Gives a coherent account of his symptoms Respiratory exam: Bilateral intravesicular Cardiovascular exam S1-S2 normal Abdomen bowel sounds are positive.  There is an epigastric area swelling approximately 5 cm x 5 cm in the midline.  It is tender.  Therefore  attempting reduction is not feasible at this time because of patient objection.  No skin changes are visible.  All other quadrants are soft and nontender. Extremities warm without edema. Data Reviewed:  Labs on Admission:  Results for orders placed or performed during the hospital encounter of 11/10/23 (from the past 24 hours)  Lipase, blood     Status: None   Collection Time: 11/10/23  1:30 PM  Result Value Ref Range   Lipase 21 11 - 51 U/L  Comprehensive metabolic panel     Status: Abnormal   Collection Time: 11/10/23  1:30 PM  Result Value Ref Range   Sodium 141 135 - 145 mmol/L   Potassium 3.5 3.5 - 5.1 mmol/L   Chloride 102 98 - 111 mmol/L   CO2 28 22 - 32 mmol/L   Glucose, Bld 112 (H) 70 - 99 mg/dL   BUN 61 (H) 8 - 23 mg/dL   Creatinine, Ser 1.61 (H) 0.61 - 1.24 mg/dL   Calcium 9.2 8.9 - 09.6 mg/dL   Total Protein 7.5 6.5 - 8.1 g/dL   Albumin 3.6 3.5 - 5.0 g/dL   AST 23 15 - 41 U/L   ALT 25 0 - 44 U/L   Alkaline Phosphatase 107 38 - 126 U/L   Total Bilirubin 0.5 0.0 - 1.2 mg/dL   GFR, Estimated 32 (L) >60 mL/min   Anion gap 11 5 - 15  CBC     Status: Abnormal   Collection Time: 11/10/23  1:30 PM  Result Value Ref Range   WBC 5.9 4.0 - 10.5 K/uL   RBC 4.08 (L) 4.22 - 5.81 MIL/uL   Hemoglobin 11.5 (L) 13.0 - 17.0 g/dL   HCT 04.5 (L) 40.9 - 81.1 %   MCV 92.6 80.0 - 100.0 fL   MCH 28.2 26.0 - 34.0 pg   MCHC 30.4  30.0 - 36.0 g/dL   RDW 16.1 (H) 09.6 - 04.5 %   Platelets 171 150 - 400 K/uL   nRBC 0.0 0.0 - 0.2 %   *Note: Due to a large number of results and/or encounters for the requested time period, some results have not been displayed. A complete set of results can be found in Results Review.   Basic Metabolic Panel: Recent Labs  Lab 11/10/23 1330  NA 141  K 3.5  CL 102  CO2 28  GLUCOSE 112*  BUN 61*  CREATININE 2.24*  CALCIUM 9.2   Liver Function Tests: Recent Labs  Lab 11/10/23 1330  AST 23  ALT 25  ALKPHOS 107  BILITOT 0.5  PROT 7.5  ALBUMIN  3.6   Recent Labs  Lab 11/10/23 1330  LIPASE 21   No results for input(s): "AMMONIA" in the last 168 hours. CBC: Recent Labs  Lab 11/10/23 1330  WBC 5.9  HGB 11.5*  HCT 37.8*  MCV 92.6  PLT 171   Cardiac Enzymes: No results for input(s): "CKTOTAL", "CKMB", "CKMBINDEX", "TROPONINIHS" in the last 168 hours.  BNP (last 3 results) No results for input(s): "PROBNP" in the last 8760 hours. CBG: No results for input(s): "GLUCAP" in the last 168 hours.  Radiological Exams on Admission:  CT ABDOMEN PELVIS W CONTRAST Result Date: 11/10/2023 CLINICAL DATA:  Acute onset abdominal pain EXAM: CT ABDOMEN AND PELVIS WITH CONTRAST TECHNIQUE: Multidetector CT imaging of the abdomen and pelvis was performed using the standard protocol following bolus administration of intravenous contrast. RADIATION DOSE REDUCTION: This exam was performed according to the departmental dose-optimization program which includes automated exposure control, adjustment of the mA and/or kV according to patient size and/or use of iterative reconstruction technique. CONTRAST:  OMNIPAQUE IOHEXOL 300 MG/ML  SOLN COMPARISON:  CT abdomen and pelvis dated 12/01/2022 FINDINGS: Lower chest: Bilateral lower lobe bronchiectasis with lateral left lower lobe basilar bronchial mucous plugging. No pleural effusion or pneumothorax demonstrated. Partially imaged heart size is normal. Coronary artery calcifications. Hepatobiliary: Mildly nodular hepatic contour. No intra or extrahepatic biliary ductal dilation. Cholelithiasis. Pancreas: No focal lesions or main ductal dilation. Spleen: Normal in size without focal abnormality. Adrenals/Urinary Tract: No adrenal nodules. No suspicious renal mass, calculi or hydronephrosis. No focal bladder wall thickening. Stomach/Bowel: Normal appearance of the stomach. Gastric band in-situ. Diffuse small bowel dilation with multiple points of tethering along the anterior abdominal surgical site. Abrupt  caliber transition deep to the superior aspect of the midline incision (2:54, 8:50). Bowel distal to this point is largely decompressed. Colonic diverticulosis without acute diverticulitis. Normal appendix. Vascular/Lymphatic: Aortic atherosclerosis. No enlarged abdominal or pelvic lymph nodes. Reproductive: Prostate is unremarkable. Other: No free fluid, fluid collection, or free air. Musculoskeletal: No acute or abnormal lytic or blastic osseous lesions. Postsurgical changes of the anterior abdominal wall. Ventral midline abdominal hernia containing fat and a loop of non obstructed transverse colon. Postsurgical changes of bilateral proximal femurs. Severe degenerative changes of the right hip. Multilevel degenerative changes of the partially imaged thoracic and lumbar spine. IMPRESSION: 1. Small-bowel obstruction with abrupt caliber transition deep to the superior aspect of the midline incision. 2. Bilateral lower lobe bronchiectasis with lateral left lower lobe basilar bronchial mucous plugging. 3. Mildly nodular hepatic contour, which can be seen in the setting of cirrhosis. 4. Cholelithiasis. 5. Aortic Atherosclerosis (ICD10-I70.0). Coronary artery calcifications. Assessment for potential risk factor modification, dietary therapy or pharmacologic therapy may be warranted, if clinically indicated. Electronically Signed   By:  Agustin Cree M.D.   On: 11/10/2023 16:53    chest X-ray  EKG: Independently reviewed. NSR  No intake/output data recorded. No intake/output data recorded.       Assessment and Plan: * SBO (small bowel obstruction) (HCC) This seems to be due to a reducible/incarcerated epigastric incisional hernia.  ER has already engaged Dr. Doylene Canard.  Patient is not actively vomiting, I do not think would benefit from nasogastric tube placement at this time.  Maintain IV fluids.  N.p.o. status.  Pain medication ordered with morphine as needed.  Look forward to surgery evaluation.  Fortunately  patient looking nontoxic.  No white count elevation I will check lactic acid.  Bronchiectasis (HCC) This is incidentally noted on the CAT scan.  Prior CAT scan from March 2024 does not seem to demonstrate this.  Patient does not seem to have exacerbation of bronchiectasis at this time.  Saline nebs, turn and position thera-PEP, chest PT. Outapatinet eval. I wil get a baseilne CXR  CKD (chronic kidney disease) Patient baseline creatinine seems to be between 2.0-2.59.  Current creatinine 2.24 seems to be at baseline.  However given patient's current n.p.o. status.  I will maintain the patient on fluids.   DVT and GI ppx ordered with lovenox and pantop respectively.   Med rec pending pharmacy input.   DM - insulin sliding scale ordered.   Advance Care Planning:   Code Status: Full Code   Consults: Dr. Fredricka Bonine of surgery engaged.  Family Communication: per pateint.  Severity of Illness: The appropriate patient status for this patient is INPATIENT. Inpatient status is judged to be reasonable and necessary in order to provide the required intensity of service to ensure the patient's safety. The patient's presenting symptoms, physical exam findings, and initial radiographic and laboratory data in the context of their chronic comorbidities is felt to place them at high risk for further clinical deterioration. Furthermore, it is not anticipated that the patient will be medically stable for discharge from the hospital within 2 midnights of admission.   * I certify that at the point of admission it is my clinical judgment that the patient will require inpatient hospital care spanning beyond 2 midnights from the point of admission due to high intensity of service, high risk for further deterioration and high frequency of surveillance required.*  Author: Nolberto Hanlon, MD 11/10/2023 6:24 PM  For on call review [email or web address].

## 2023-11-10 NOTE — ED Provider Notes (Signed)
 [facility] EMERGENCY DEPARTMENT AT [facility] Provider Note   CSN: [id number] Arrival date & time: 11/10/23  1303     History  Chief Complaint  Patient presents with   Abdominal Pain    [name] is a 68 y.o. male with PMHx colon cancer s/p sigmoid colectomy 2007, anemia, CHF, DM, HTN, HLD, neuropathy, BPH, PAD, chronic idiopathic constipation, CKD, who presents to ED concerned for abdominal pain since 6AM. Patient took his home dose of Norco 3 hours ago which did not relieve pain. Last abdominal surgery 3-4 years ago. Last BM 2 days ago. Also endorses non-bloody emesis 3 times today. Patient stating that the abdominal pain is located in the epigastric area and is radiating throughout his entire body. Also stating that the pain is causing him to have SOB.  Denies fever, diarrhea, dysuria, hematuria, hematochezia, hematemesis.    Abdominal Pain      Home Medications Prior to Admission medications   Medication Sig Start Date End Date Taking? Authorizing Provider  Accu-Chek Softclix Lancets lancets CHECK BLOOD SUGAR 2 TIMES  DAILY 10/26/21   Etta Grandchild, MD  Albuterol Sulfate (PROAIR RESPICLICK) 108 (90 Base) MCG/ACT AEPB Inhale 1 puff into the lungs 4 (four) times daily as needed. 02/05/21   Etta Grandchild, MD  allopurinol (ZYLOPRIM) 100 MG tablet Take 1 tablet (100 mg total) by mouth daily. 11/30/21   Regalado, Jon Billings A, MD  aspirin EC 81 MG tablet Take 1 tablet (81 mg total) by mouth daily. 04/17/20   Etta Grandchild, MD  atorvastatin (LIPITOR) 80 MG tablet Take 1 tablet (80 mg total) by mouth daily. 07/13/23   Laurey Morale, MD  bisoprolol (ZEBETA) 10 MG tablet Take 1 tablet (10 mg total) by mouth every evening. 04/19/21   Etta Grandchild, MD  blood glucose meter kit and supplies KIT Inject 1 each into the skin 4 (four) times daily. Use to check blood sugar three times daily. Dispense Freestyle Libre according to insurance preference. DX: E11.8 10/26/21   Etta Grandchild, MD  blood glucose meter kit and supplies Dispense based on patient and insurance preference. Use up to four times daily as directed. (FOR ICD-10 E10.9, E11.9). 10/26/21   Etta Grandchild, MD  Blood Glucose Monitoring Suppl (ACCU-CHEK AVIVA CONNECT) w/Device KIT 1 Act by Does not apply route 3 (three) times daily as needed. 10/26/21   Etta Grandchild, MD  colchicine 0.6 MG tablet Take 0.6 mg by mouth daily.    [provider]  Continuous Blood Gluc Sensor (FREESTYLE LIBRE 2 SENSOR) MISC 1 Act by Does not apply route daily. 07/27/21   Etta Grandchild, MD  empagliflozin (JARDIANCE) 10 MG TABS tablet Take 1 tablet (10 mg total) by mouth daily before breakfast. 03/16/23   Laurey Morale, MD  ferrous sulfate 325 (65 FE) MG tablet Take 1 tablet (325 mg total) by mouth 2 (two) times daily with a meal. 10/07/21   Etta Grandchild, MD  furosemide (LASIX) 20 MG tablet Take 5 tablets (100 mg total) by mouth 2 (two) times daily. 11/30/21   Regalado, Belkys A, MD  glucose blood (ACCU-CHEK AVIVA PLUS) test strip Use to test blood sugar twice daily. DX E11.8 10/21/20   Etta Grandchild, MD  HYDROcodone-acetaminophen (NORCO) 10-325 MG tablet Take 1 tablet by mouth every 6 (six) hours as needed. 03/25/22   [provider]  insulin detemir (LEVEMIR) 100 UNIT/ML injection Inject 60 Units into the skin  daily.    [provider]  Insulin Pen Needle (ULTICARE SHORT PEN NEEDLES) 31G X 8 MM MISC USE TWO PEN NEEDLES DAILY WITH INSULIN PENS 07/19/21   Etta Grandchild, MD  levothyroxine (SYNTHROID) 75 MCG tablet Take 75 mcg by mouth daily. 11/19/21   [provider]  losartan (COZAAR) 25 MG tablet Take 1 tablet (25 mg total) by mouth daily. 10/13/23   Jacklynn Ganong, FNP  Magnesium Hydroxide (DULCOLAX PO) Take 1 tablet by mouth daily.    [provider]  metolazone (ZAROXOLYN) 2.5 MG tablet TAKE 1 TABLET (2.5 MG TOTAL) BY MOUTH ONCE A WEEK. EVERY TUESDAY 04/11/23   Laurey Morale, MD   Multiple Vitamins-Minerals (MULTIVITAMIN ADULT PO) Take 1 tablet by mouth daily.    [provider]  potassium chloride SA (KLOR-CON M) 20 MEQ tablet TAKE 1 TABLET BY MOUTH ONCE A WEEK. EVERY TUESDAY 04/11/23   Laurey Morale, MD  RESTASIS 0.05 % ophthalmic emulsion PLACE 1 DROP INTO BOTH EYES TWICE A DAY 05/07/21   Etta Grandchild, MD      Allergies    Shellfish allergy, Lisinopril, Relistor [methylnaltrexone bromide], and Lovastatin    Review of Systems   Review of Systems  Gastrointestinal:  Positive for abdominal pain.    Physical Exam Updated Vital Signs BP 129/81   Pulse 61   Temp 98.2 F (36.8 C) (Oral)   Resp 13   SpO2 95%  Physical Exam Vitals and nursing note reviewed.  Constitutional:      General: He is in acute distress.     Appearance: He is not ill-appearing or toxic-appearing.  HENT:     Head: Normocephalic and atraumatic.     Mouth/Throat:     Mouth: Mucous membranes are moist.     Pharynx: No posterior oropharyngeal erythema.  Eyes:     General: No scleral icterus.       Right eye: No discharge.        Left eye: No discharge.     Conjunctiva/sclera: Conjunctivae normal.  Cardiovascular:     Rate and Rhythm: Normal rate.     Pulses: Normal pulses.     Heart sounds: No murmur heard. Pulmonary:     Effort: Pulmonary effort is normal. No respiratory distress.     Breath sounds: Normal breath sounds. No wheezing, rhonchi or rales.  Abdominal:     General: Bowel sounds are normal.     Palpations: Abdomen is soft. There is no mass.     Tenderness: There is abdominal tenderness in the epigastric area.     Comments: Patient morbidly obese - no masses or distention palpated  Musculoskeletal:     Right lower leg: No edema.     Left lower leg: No edema.  Skin:    General: Skin is warm and dry.     Findings: No rash.  Neurological:     General: No focal deficit present.     Mental Status: He is alert and oriented to person, place, and time.  Mental status is at baseline.  Psychiatric:        Mood and Affect: Mood normal.     ED Results / Procedures / Treatments   Labs (all labs ordered are listed, but only abnormal results are displayed) Labs Reviewed  COMPREHENSIVE METABOLIC PANEL - Abnormal; Notable for the following components:      Result Value   Glucose, Bld 112 (*)    BUN 61 (*)  Creatinine, Ser 2.24 (*)    GFR, Estimated 32 (*)    All other components within normal limits  CBC - Abnormal; Notable for the following components:   RBC 4.08 (*)    Hemoglobin 11.5 (*)    HCT 37.8 (*)    RDW 15.6 (*)    All other components within normal limits  LIPASE, BLOOD  URINALYSIS, ROUTINE W REFLEX MICROSCOPIC  CBC  CREATININE, SERUM  HIV ANTIBODY (ROUTINE TESTING W REFLEX)  APTT  PROTIME-INR  BASIC METABOLIC PANEL  CBC    EKG EKG Interpretation Date/Time:  Friday November 10 2023 14:09:49 EST Ventricular Rate:  60 PR Interval:  195 QRS Duration:  135 QT Interval:  500 QTC Calculation: 500 R Axis:   6  Text Interpretation: Sinus rhythm Left bundle branch block No significant change since last tracing Confirmed by Gwyneth Sprout ([id number]) on 11/10/2023 2:21:46 PM  Radiology CT ABDOMEN PELVIS W CONTRAST Result Date: 11/10/2023 CLINICAL DATA:  Acute onset abdominal pain EXAM: CT ABDOMEN AND PELVIS WITH CONTRAST TECHNIQUE: Multidetector CT imaging of the abdomen and pelvis was performed using the standard protocol following bolus administration of intravenous contrast. RADIATION DOSE REDUCTION: This exam was performed according to the departmental dose-optimization program which includes automated exposure control, adjustment of the mA and/or kV according to patient size and/or use of iterative reconstruction technique. CONTRAST:  OMNIPAQUE IOHEXOL 300 MG/ML  SOLN COMPARISON:  CT abdomen and pelvis dated 12/01/2022 FINDINGS: Lower chest: Bilateral lower lobe bronchiectasis with lateral left lower lobe basilar  bronchial mucous plugging. No pleural effusion or pneumothorax demonstrated. Partially imaged heart size is normal. Coronary artery calcifications. Hepatobiliary: Mildly nodular hepatic contour. No intra or extrahepatic biliary ductal dilation. Cholelithiasis. Pancreas: No focal lesions or main ductal dilation. Spleen: Normal in size without focal abnormality. Adrenals/Urinary Tract: No adrenal nodules. No suspicious renal mass, calculi or hydronephrosis. No focal bladder wall thickening. Stomach/Bowel: Normal appearance of the stomach. Gastric band in-situ. Diffuse small bowel dilation with multiple points of tethering along the anterior abdominal surgical site. Abrupt caliber transition deep to the superior aspect of the midline incision (2:54, 8:50). Bowel distal to this point is largely decompressed. Colonic diverticulosis without acute diverticulitis. Normal appendix. Vascular/Lymphatic: Aortic atherosclerosis. No enlarged abdominal or pelvic lymph nodes. Reproductive: Prostate is unremarkable. Other: No free fluid, fluid collection, or free air. Musculoskeletal: No acute or abnormal lytic or blastic osseous lesions. Postsurgical changes of the anterior abdominal wall. Ventral midline abdominal hernia containing fat and a loop of non obstructed transverse colon. Postsurgical changes of bilateral proximal femurs. Severe degenerative changes of the right hip. Multilevel degenerative changes of the partially imaged thoracic and lumbar spine. IMPRESSION: 1. Small-bowel obstruction with abrupt caliber transition deep to the superior aspect of the midline incision. 2. Bilateral lower lobe bronchiectasis with lateral left lower lobe basilar bronchial mucous plugging. 3. Mildly nodular hepatic contour, which can be seen in the setting of cirrhosis. 4. Cholelithiasis. 5. Aortic Atherosclerosis (ICD10-I70.0). Coronary artery calcifications. Assessment for potential risk factor modification, dietary therapy or  pharmacologic therapy may be warranted, if clinically indicated. Electronically Signed   By: Agustin Cree M.D.   On: 11/10/2023 16:53    Procedures .Critical Care  Performed by: Dorthy Cooler, PA-C Authorized by: Dorthy Cooler, PA-C   Critical care provider statement:    Critical care time (minutes):  30   Critical care was necessary to treat or prevent imminent or life-threatening deterioration of the following conditions: SBO.   Critical  care was time spent personally by me on the following activities:  Development of treatment plan with patient or surrogate, discussions with consultants, evaluation of patient's response to treatment, examination of patient, ordering and review of laboratory studies, ordering and review of radiographic studies, ordering and performing treatments and interventions, pulse oximetry, re-evaluation of patient's condition and review of old charts   Care discussed with: admitting provider   Comments:     SBO requiring multiple doses of pain medication and medical admission     Medications Ordered in ED Medications  enoxaparin (LOVENOX) injection 40 mg (has no administration in time range)  0.9 %  sodium chloride infusion (has no administration in time range)  acetaminophen (TYLENOL) tablet 650 mg (has no administration in time range)    Or  acetaminophen (TYLENOL) suppository 650 mg (has no administration in time range)  polyethylene glycol (MIRALAX / GLYCOLAX) packet 17 g (has no administration in time range)  sodium chloride flush (NS) 0.9 % injection 3 mL (has no administration in time range)  fentaNYL (SUBLIMAZE) injection 50 mcg (50 mcg Intravenous Given 11/10/23 1334)  ondansetron (ZOFRAN) injection 4 mg (4 mg Intravenous Given 11/10/23 1338)  fentaNYL (SUBLIMAZE) injection 50 mcg (50 mcg Intravenous Given 11/10/23 1454)  iohexol (OMNIPAQUE) 300 MG/ML solution 100 mL (100 mLs Intravenous Contrast Given 11/10/23 1540)  fentaNYL (SUBLIMAZE)  injection 50 mcg (50 mcg Intravenous Given 11/10/23 1729)    ED Course/ Medical Decision Making/ A&P                                 Medical Decision Making Amount and/or Complexity of Data Reviewed Labs: ordered. Radiology: ordered.  Risk Prescription drug management.    This patient presents to the ED for concern of abdominal pain, this involves an extensive number of treatment options, and is a complaint that carries with it a high risk of complications and morbidity.  The differential diagnosis includes gastroenteritis, colitis, small bowel obstruction, appendicitis, cholecystitis, pancreatitis, nephrolithiasis, UTI, pyleonephritis, testicular torsion.   Co morbidities that complicate the patient evaluation  colon cancer s/p sigmoid colectomy 2007, anemia, CHF, DM, HTN, HLD, neuropathy, BPH, PAD, chronic idiopathic constipation, CKD   Additional history obtained:  Dr. Manson Passey PCP   Problem List / ED Course / Critical interventions / Medication management  Patient presented for abdominal pain and vomiting starting today. On exam patient was expressing severe pain that was happening in waves. There is also epigastric tenderness to palpation. Patient afebrile with stable vitals. Last BM 2 days ago. Vomiting has been controlled after pain management and Zofran. I Ordered, and personally interpreted labs.  CBC without leukocytosis.  There is mild anemia with hemoglobin 11.5.  CMP with patient's chronic elevation in BUN/creatinine at 61/2.24.  Lipase within normal limits. I ordered imaging studies including CT Abd/Pelvis with contrast: evaluate for structural/surgical etiology of patients' severe abdominal pain. I independently visualized and interpreted imaging and I agree with the radiologist interpretation of Small-bowel obstruction with abrupt caliber transition deep to the superior aspect of the midline incision. Consulted with Dr. Fredricka Bonine who agrees to see patient in ED and  recommends hospitalist admission. I personally viewed and interpreted the EKG/cardiac monitored which showed an underlying rhythm of: sinus rhythm I have reviewed the patients home medicines and have made adjustments as needed 5:30PM: Dr. Charmayne Sheer admitting provider  Social Determinants of Health:  none  Final Clinical Impression(s) / ED Diagnoses Final diagnoses:  SBO (small bowel obstruction) [facility])    Rx / DC Orders ED Discharge Orders     None         Margarita Rana 11/10/23 1732    Gwyneth Sprout, MD 11/12/23 [phone]

## 2023-11-10 NOTE — Progress Notes (Signed)
  Subjective:  Patient ID: [name], male    DOB: [birth year],  MRN: [id number]  Chief Complaint  Patient presents with   Nail Problem    Nail trim   68 y.o. male returns for the above complaint.  Patient presents with thickened onychodystrophy mycotic toenails x 10 with no progression he would like for me debride and is not able to himself denies any other acute complaints.  Objective:  There were no vitals filed for this visit. Podiatric Exam: Vascular: dorsalis pedis and posterior tibial pulses are palpable bilateral. Capillary return is immediate. Temperature gradient is WNL. Skin turgor WNL  Sensorium: Normal Semmes Weinstein monofilament test. Normal tactile sensation bilaterally. Nail Exam: Pt has thick disfigured discolored nails with subungual debris noted bilateral entire nail hallux through fifth toenails.  Pain on palpation to the nails. Ulcer Exam: There is no evidence of ulcer or pre-ulcerative changes or infection. Orthopedic Exam: Muscle tone and strength are WNL. No limitations in general ROM. No crepitus or effusions noted.  Skin: No Porokeratosis. No infection or ulcers    Assessment & Plan:   1. Pain due to onychomycosis of toenails of both feet   2. Diabetic peripheral neuropathy associated with type 2 diabetes mellitus (HCC)     Patient was evaluated and treated and all questions answered.  Onychomycosis with pain  -Nails palliatively debrided as below. -Educated on self-care  Procedure: Nail Debridement Rationale: pain  Type of Debridement: manual, sharp debridement. Instrumentation: Nail nipper, rotary burr. Number of Nails: 10  Procedures and Treatment: Consent by patient was obtained for treatment procedures. The patient understood the discussion of treatment and procedures well. All questions were answered thoroughly reviewed. Debridement of mycotic and hypertrophic toenails, 1 through 5 bilateral and clearing of subungual debris. No ulceration,  no infection noted.  Return Visit-Office Procedure: Patient instructed to return to the office for a follow up visit 3 months for continued evaluation and treatment.  Nicholes Rough, DPM    No follow-ups on file.

## 2023-11-10 NOTE — Assessment & Plan Note (Addendum)
This is incidentally noted on the CAT scan.  Prior CAT scan from March 2024 does not seem to demonstrate this.  Patient does not seem to have exacerbation of bronchiectasis at this time.  Saline nebs, turn and position thera-PEP, chest PT. Outapatinet eval. I wil get a baseilne CXR

## 2023-11-10 NOTE — Consult Note (Signed)
Surgical Evaluation Requesting provider: Valrie Hart PA-C  Chief Complaint: abdominal pain  HPI: 68 year old man with multiple medical problems including congestive heart failure, morbid obesity, diabetes with history of diabetic foot ulcer and peripheral neuropathy, hypertension, hyperlipidemia, obstructive sleep apnea on BiPAP, colon cancer, anemia, peripheral arterial disease, BPH, chronic constipation, and chronic kidney disease who presents with abdominal pain that began around 6:00 this morning.  This is in the epigastric area and radiates through his entire body.  He attempted to relieve this with hydrocodone with no relief. Associated with 3 episodes of nonbloody emesis.  Reports last bowel movement was 2 days ago, though he reports with chronic constipation due to multiple meds that he takes he does not usually have a bowel movement every day.  Has undergone workup in the emergency department concerning for small bowel obstruction. Currently he reports the nausea is resolved, pain is significantly better but not completely gone, after receiving pain medication.  Previous abdominal surgery notable for sigmoid colectomy in 2007 by Dr. Orson Slick for cancer, lap band placement in 2011 ([facility]) (Lap-Band port has been removed but band still in place), open incisional hernia repair with mesh by Dr. Dwain Sarna in 2015.  The latter op note describes lysis of adhesions x 1 hour and placement of a preperitoneal 20 x 25 cm Ventralight mesh.  Peritoneum was closed with #1 Novafil suture.  He has had the supraumbilical recurrent incisional hernia for at least for 5 years.  It has not been reducible for most of that time, but does get smaller and softer after he has a bowel movement.  He has followed up with our practice and recommendations had been to pursue weight loss prior to elective hernia repair.  Allergies  Allergen Reactions   Shellfish Allergy Anaphylaxis   Lisinopril Cough   Relistor  [Methylnaltrexone Bromide] Other (See Comments)    confusion   Lovastatin Itching    Past Medical History:  Diagnosis Date   Anemia    iron   CHF (congestive heart failure) (HCC)    Presumed diastolic. Echo (06/07) w.EF 45%, severe posterior HK, mild LV hypertrophy, No further work-up of abnormal echo was done. pt denies CHF   Colon cancer (HCC) 2007   s/p sigmoid colectomy   Diabetes mellitus    Has Hx of diabetic foot ulcer & peripheral neuropathy  type2   Dyspnea    w/ activity   History of PFTs 05/2009   Mild Obstructive defect   HTN (hypertension)    Hyperlipidemia    Morbid obesity (HCC)    OSA on CPAP    Pneumonia    week ago     Past Surgical History:  Procedure Laterality Date   BARIATRIC SURGERY  12/2009   Lap Band/At [facility]   COLONOSCOPY  multiple   COLONOSCOPY WITH PROPOFOL N/A 11/26/2012   Procedure: COLONOSCOPY WITH PROPOFOL;  Surgeon: Iva Boop, MD;  Location: [facility];  Service: Endoscopy;  Laterality: N/A;  may need pre appt. with anesthesia due to morbid obesity   COLONOSCOPY WITH PROPOFOL N/A 07/11/2017   Procedure: COLONOSCOPY WITH PROPOFOL;  Surgeon: Iva Boop, MD;  Location: [facility];  Service: Endoscopy;  Laterality: N/A;   CORNEAL TRANSPLANT  1980 & 1984   '80/Right  '84/Left   EYE SURGERY Right 05/2017   HERNIA REPAIR     HIP SURGERY     bilateral   INCISIONAL HERNIA REPAIR N/A 12/18/2013   Procedure:  REPAIR OF INCARCERATED INCISIONAL HERNIA;  Surgeon: Molli Hazard  Dwain Sarna, MD;  Location: [facility] OR;  Service: General;  Laterality: N/A;   INSERTION OF MESH N/A 12/18/2013   Procedure: INSERTION OF MESH;  Surgeon: Emelia Loron, MD;  Location: [facility] OR;  Service: General;  Laterality: N/A;   Sigmoid Colectomy  10/2005   [facility]   TONSILLECTOMY      Family History  Problem Relation Age of Onset   Hepatitis Mother        hepatitis C   Diabetes Mother    Congestive Heart Failure Mother    CAD Mother    Heart attack Brother 67    Diabetes Father    Heart failure Brother    Stomach cancer Neg Hx    Colon cancer Neg Hx     Social History   Socioeconomic History   Marital status: Single    Spouse name: Not on file   Number of children: 0   Years of education: Not on file   Highest education level: Not on file  Occupational History   Occupation: Banker  Tobacco Use   Smoking status: Never   Smokeless tobacco: Never  Vaping Use   Vaping status: Never Used  Substance and Sexual Activity   Alcohol use: Not Currently    Alcohol/week: 0.0 standard drinks of alcohol    Comment: states he does not drink    Drug use: No   Sexual activity: Not Currently  Other Topics Concern   Not on file  Social History Narrative   HSG, 2 years of College   [place]   Work: Prior Teacher, 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   In new relationship (04/2009) - girlfriend helps taste for cooking business and helps monitor for ulcers.   Regular Exercise- No      Social Drivers of Corporate investment banker Strain: Low Risk  (08/10/2021)   Overall Financial Resource Strain (CARDIA)    Difficulty of Paying Living Expenses: Not very hard  Food Insecurity: No Food Insecurity (02/27/2023)   [facility], [facility]   Hunger Vital Sign    Worried About Running Out of Food in the Last Year: Never true    Ran Out of Food in the Last Year: Never true  Transportation Needs: No Transportation Needs (08/10/2021)   PRAPARE - Administrator, Civil Service (Medical): No    Lack of Transportation (Non-Medical): No  Physical Activity: Inactive (11/06/2020)   Exercise Vital Sign    Days of Exercise per Week: 0 days    Minutes of Exercise per Session: 0 min  Stress: Stress Concern Present (11/07/2019)   [employer] of Occupational Health  - Occupational Stress Questionnaire    Feeling of Stress : To some extent  Social Connections: Socially Isolated (11/06/2020)   Social Connection and Isolation Panel [NHANES]    Frequency of Communication with Friends and Family: More than three times a week    Frequency of Social Gatherings with Friends and Family: Once a week    Attends Religious Services: Never    Database administrator or Organizations: No    Attends Banker Meetings: Never    Marital Status: Never married    No current facility-administered medications on file prior to encounter.   Current Outpatient Medications on File Prior to Encounter  Medication Sig Dispense Refill   Accu-Chek Softclix Lancets lancets CHECK BLOOD SUGAR 2 TIMES  DAILY 300 each 3   Albuterol Sulfate (PROAIR RESPICLICK) 108 (90 Base) MCG/ACT AEPB Inhale 1 puff into the lungs 4 (four) times daily as needed. 3 each 1   allopurinol (ZYLOPRIM) 100 MG tablet Take 1 tablet (100 mg total) by mouth daily. 30 tablet 2   aspirin EC 81 MG tablet Take 1 tablet (81 mg total) by mouth daily. 90 tablet 1   atorvastatin (LIPITOR) 80 MG tablet Take 1 tablet (80 mg total) by mouth daily. 90 tablet 3   bisoprolol (ZEBETA) 10 MG tablet Take 1 tablet (10 mg total) by mouth every evening. 90 tablet 1   blood glucose meter kit and supplies KIT Inject 1 each into the skin 4 (four) times daily. Use to check blood sugar three times daily. Dispense Freestyle Libre according to insurance preference. DX: E11.8 2 each 1   blood glucose meter kit and supplies Dispense based on patient and insurance preference. Use up to four times daily as directed. (FOR ICD-10 E10.9, E11.9). 1 each 0   Blood Glucose Monitoring Suppl (ACCU-CHEK AVIVA CONNECT) w/Device KIT 1 Act by Does not apply route 3 (three) times daily as needed. 1 kit 3   colchicine 0.6 MG tablet Take 0.6 mg by mouth daily.     Continuous Blood Gluc Sensor (FREESTYLE LIBRE 2 SENSOR) MISC 1 Act by Does not apply  route daily. 2 each 5   empagliflozin (JARDIANCE) 10 MG TABS tablet Take 1 tablet (10 mg total) by mouth daily before breakfast. 90 tablet 3   ferrous sulfate 325 (65 FE) MG tablet Take 1 tablet (325 mg total) by mouth 2 (two) times daily with a meal. 180 tablet 1   furosemide (LASIX) 20 MG tablet Take 5 tablets (100 mg total) by mouth 2 (two) times daily. 120 tablet 1   glucose blood (ACCU-CHEK AVIVA PLUS) test strip Use to test blood sugar twice daily. DX E11.8 200 strip 3   HYDROcodone-acetaminophen (NORCO) 10-325 MG tablet Take 1 tablet by mouth every 6 (six) hours as needed.     insulin detemir (LEVEMIR) 100 UNIT/ML injection Inject 60 Units into the skin daily.     Insulin Pen Needle (ULTICARE SHORT PEN NEEDLES) 31G X 8 MM MISC USE TWO PEN NEEDLES DAILY WITH INSULIN PENS 200 each 2   levothyroxine (SYNTHROID) 75 MCG tablet Take 75 mcg by mouth daily.     losartan (COZAAR) 25 MG tablet Take 1 tablet (25 mg total) by mouth daily. 90 tablet 3   Magnesium Hydroxide (DULCOLAX PO) Take 1 tablet by mouth daily.     metolazone (ZAROXOLYN) 2.5 MG tablet TAKE 1 TABLET (2.5 MG TOTAL) BY MOUTH ONCE A WEEK. EVERY TUESDAY 10 tablet 3   Multiple Vitamins-Minerals (MULTIVITAMIN ADULT PO) Take 1 tablet by mouth daily.     potassium chloride SA (KLOR-CON M) 20 MEQ tablet TAKE 1 TABLET BY MOUTH ONCE A WEEK. EVERY TUESDAY 10 tablet 3   RESTASIS 0.05 % ophthalmic emulsion PLACE 1 DROP INTO BOTH EYES TWICE A DAY 60 each 5    Review of Systems: a complete, 10pt review of systems was completed with pertinent positives and negatives as documented in the HPI  Physical Exam: Vitals:   11/10/23 1730 11/10/23 1800  BP: 120/70 119/80  Pulse: 65 60  Resp: 14 14  Temp:    SpO2: 91% 93%   Gen: A&Ox3, no distress  HEENT:  lids and conjunctivae normal, no icterus.  Moist mucous membranes Chest: respiratory effort is normal. No crepitus or tenderness on palpation of the chest.   Cardiovascular:  RRR Gastrointestinal: Abdomen is obese with well-healed midline incision.  Just superior to this is a chronically incarcerated hernia which is soft and nontender.  Overall abdomen is soft and nontender, difficult to gauge distention due to habitus. Muscoloskeletal: no clubbing or cyanosis of the fingers.  Strength is symmetrical throughout.  Range of motion of bilateral upper and lower extremities normal without pain, crepitation or contracture. Neuro: cranial nerves grossly intact.  Sensation intact to light touch diffusely. Psych: appropriate mood and affect, normal insight/judgment intact  Skin: warm and dry      Latest Ref Rng & Units 11/10/2023    1:30 PM 11/27/2021    7:46 AM 11/26/2021    3:11 AM  CBC  WBC 4.0 - 10.5 K/uL 5.9  7.1  5.0   Hemoglobin 13.0 - 17.0 g/dL 56.3  9.2  87.5   Hematocrit 39.0 - 52.0 % 37.8  31.2  37.0   Platelets 150 - 400 K/uL 171  164  146        Latest Ref Rng & Units 11/10/2023    1:30 PM 10/27/2023    9:17 AM 10/13/2023    9:47 AM  CMP  Glucose 70 - 99 mg/dL 643  329  70   BUN 8 - 23 mg/dL 61  57  67   Creatinine 0.61 - 1.24 mg/dL 5.18  8.41  6.60   Sodium 135 - 145 mmol/L 141  138  139   Potassium 3.5 - 5.1 mmol/L 3.5  3.7  3.5   Chloride 98 - 111 mmol/L 102  101  97   CO2 22 - 32 mmol/L 28  27  31    Calcium 8.9 - 10.3 mg/dL 9.2  8.7  9.3   Total Protein 6.5 - 8.1 g/dL 7.5   7.3   Total Bilirubin 0.0 - 1.2 mg/dL 0.5   0.4   Alkaline Phos 38 - 126 U/L 107   90   AST 15 - 41 U/L 23   22   ALT 0 - 44 U/L 25   21     No results found for: "INR", "PROTIME"  Imaging: CT ABDOMEN PELVIS W CONTRAST Result Date: 11/10/2023 CLINICAL DATA:  Acute onset abdominal pain EXAM: CT ABDOMEN AND PELVIS WITH CONTRAST TECHNIQUE: Multidetector CT imaging of the abdomen and pelvis was performed using the standard protocol following bolus administration of intravenous contrast. RADIATION DOSE REDUCTION: This exam was performed according to the departmental  dose-optimization program which includes automated exposure control, adjustment of the mA and/or kV according to patient size and/or use of iterative reconstruction technique. CONTRAST:  OMNIPAQUE IOHEXOL 300 MG/ML  SOLN COMPARISON:  CT abdomen and pelvis dated 12/01/2022 FINDINGS: Lower chest: Bilateral lower lobe bronchiectasis with lateral left lower lobe basilar bronchial mucous plugging. No pleural effusion or pneumothorax demonstrated. Partially imaged heart size is normal. Coronary artery calcifications. Hepatobiliary: Mildly nodular hepatic contour. No intra or extrahepatic biliary ductal dilation. Cholelithiasis. Pancreas: No focal lesions or main ductal dilation. Spleen: Normal in size without focal abnormality. Adrenals/Urinary Tract: No adrenal nodules. No suspicious renal mass, calculi or hydronephrosis. No focal bladder wall thickening. Stomach/Bowel: Normal appearance of the stomach. Gastric band in-situ. Diffuse small bowel dilation with multiple points of tethering along the anterior abdominal surgical site. Abrupt caliber transition deep to the superior aspect of the midline  incision (2:54, 8:50). Bowel distal to this point is largely decompressed. Colonic diverticulosis without acute diverticulitis. Normal appendix. Vascular/Lymphatic: Aortic atherosclerosis. No enlarged abdominal or pelvic lymph nodes. Reproductive: Prostate is unremarkable. Other: No free fluid, fluid collection, or free air. Musculoskeletal: No acute or abnormal lytic or blastic osseous lesions. Postsurgical changes of the anterior abdominal wall. Ventral midline abdominal hernia containing fat and a loop of non obstructed transverse colon. Postsurgical changes of bilateral proximal femurs. Severe degenerative changes of the right hip. Multilevel degenerative changes of the partially imaged thoracic and lumbar spine. IMPRESSION: 1. Small-bowel obstruction with abrupt caliber transition deep to the superior aspect of the  midline incision. 2. Bilateral lower lobe bronchiectasis with lateral left lower lobe basilar bronchial mucous plugging. 3. Mildly nodular hepatic contour, which can be seen in the setting of cirrhosis. 4. Cholelithiasis. 5. Aortic Atherosclerosis (ICD10-I70.0). Coronary artery calcifications. Assessment for potential risk factor modification, dietary therapy or pharmacologic therapy may be warranted, if clinically indicated. Electronically Signed   By: Agustin Cree M.D.   On: 11/10/2023 16:53     A/P: 68 year old male with multiple medical problems as listed above who presents with a small bowel obstruction.  Vital signs are reassuring; afebrile and with no tachycardia, currently normotensive and saturating 93% on room air with respiratory rate of 14. Labs are also reassuring with a current creatinine of 2.24 which is at or below his recent baseline normal bicarb, normal WBC. I have reviewed his CT imaging personally.  He has a recurrent incisional hernia that does contain a loop of non-obstructed and non-inflamed transverse colon.  Just inferior to this, adjacent to the abdominal wall is a transition point in the small bowel which appears secondary to adhesive disease.  Additional findings notable for bilateral lower lobe bronchiectasis with left lateral lower lobe bronchial mucous plugging, nodular hepatic contour concerning for cirrhosis, gallstones, aortic atherosclerosis, coronary artery calcifications  At this point recommend admission for NG tube decompression, gentle fluid resuscitation, pain and nausea control.  Will plan to proceed with small bowel obstruction protocol after period of NG tube decompression.  We discussed that surgery would be a last resort in this case.  We discussed ultimately elective hernia repair would be desirable and I agree with previous recommendations he has been given regarding weight loss in order to improve his outcome from this.  Given prior retrorectus repair, if he  does pursue elective hernia repair this would best be done by a hernia specialist.   Patient Active Problem List   Diagnosis Date Noted   SBO (small bowel obstruction) (HCC) 11/10/2023   Gout attack 11/27/2021   Hypocalcemia 11/25/2021   Acute metabolic encephalopathy 11/25/2021   Acute respiratory failure with hypoxia and hypercapnia (HCC) 11/25/2021   Stage 3b chronic kidney disease (CKD) (HCC) 11/25/2021   Syncope 11/25/2021   Hypoglycemia 11/24/2021   Fall (on)(from) sidewalk curb, initial encounter 11/24/2021   Chronic respiratory failure with hypoxia (HCC) 11/24/2021   Hyperkalemia 09/28/2021   Dependence on supplemental oxygen 09/19/2021   Long term (current) use of insulin (HCC) 09/19/2021   Opioid abuse, uncomplicated (HCC) 09/19/2021   Type 2 diabetes mellitus with diabetic peripheral angiopathy without gangrene (HCC) 09/19/2021   Insulin-requiring or dependent type II diabetes mellitus (HCC) 07/27/2021   Encounter for general adult medical examination with abnormal findings 04/06/2021   Calculus of gallbladder without cholecystitis without obstruction 04/06/2021   Chronic idiopathic constipation 11/09/2020   PAD (peripheral artery disease) (HCC) 07/24/2020   Ulcer of left  foot, limited to breakdown of skin (HCC) 07/23/2020   Drug-induced erectile dysfunction 05/22/2019   Onychomycosis of toenail 05/21/2019   Class 3 obesity with alveolar hypoventilation, serious comorbidity, and body mass index (BMI) of 50.0 to 59.9 in adult (HCC) 02/07/2019   Ventral hernia without obstruction or gangrene 05/31/2018   Therapeutic opioid-induced constipation (OIC) 05/31/2018   Long-term current use of opiate analgesic 02/15/2018   Left lumbar radiculopathy 01/29/2018   B12 deficiency 10/17/2017   Bleeding internal hemorrhoids    Insomnia 06/30/2017   Left eye affected by proliferative diabetic retinopathy with traction retinal detachment not involving macula, associated with type 2  diabetes mellitus (HCC) 04/20/2017   Right eye affected by proliferative diabetic retinopathy with traction retinal detachment involving macula, associated with type 2 diabetes mellitus (HCC) 04/20/2017   Vitamin D deficiency 12/23/2016   Thiamine deficiency 08/02/2016   Primary osteoarthritis involving multiple joints 08/03/2015   Diabetes mellitus type II, uncontrolled 05/23/2014   Herpesviral keratitis 05/23/2014   Diabetes mellitus due to underlying condition with both eyes affected by proliferative retinopathy without macular edema, with long-term current use of insulin (HCC) 05/23/2014   Diabetic foot ulcer 02/05/2014   Iron deficiency anemia 01/09/2014   Osteoarthritis of hip 05/08/2013   Diabetic nephropathy (HCC) 05/07/2013   Bariatric surgery status 01/29/2013   Benign prostatic hyperplasia without lower urinary tract symptoms 01/11/2013   Hypothyroidism 01/11/2013   Colon cancer (HCC) 10/04/2012   Chronic diastolic CHF (congestive heart failure) (HCC) 04/13/2012   Diabetic peripheral neuropathy (HCC) 05/29/2008   OSA (obstructive sleep apnea) 10/01/2007   CARDIOMYOPATHY 10/01/2007   Hyperlipidemia with target LDL less than 100 07/24/2007   Essential hypertension 07/24/2007       Phylliss Blakes, MD [facility]  See AMION to contact appropriate on-call provider

## 2023-11-11 ENCOUNTER — Inpatient Hospital Stay (HOSPITAL_COMMUNITY): Payer: 59

## 2023-11-11 ENCOUNTER — Encounter (HOSPITAL_COMMUNITY): Payer: Self-pay | Admitting: Internal Medicine

## 2023-11-11 DIAGNOSIS — K56609 Unspecified intestinal obstruction, unspecified as to partial versus complete obstruction: Secondary | ICD-10-CM | POA: Diagnosis not present

## 2023-11-11 LAB — GLUCOSE, CAPILLARY
Glucose-Capillary: 66 mg/dL — ABNORMAL LOW (ref 70–99)
Glucose-Capillary: 102 mg/dL — ABNORMAL HIGH (ref 70–99)
Glucose-Capillary: 104 mg/dL — ABNORMAL HIGH (ref 70–99)
Glucose-Capillary: 116 mg/dL — ABNORMAL HIGH (ref 70–99)
Glucose-Capillary: 119 mg/dL — ABNORMAL HIGH (ref 70–99)
Glucose-Capillary: 113 mg/dL — ABNORMAL HIGH (ref 70–99)
Glucose-Capillary: 115 mg/dL — ABNORMAL HIGH (ref 70–99)

## 2023-11-11 LAB — CBC
HCT: 35.8 % — ABNORMAL LOW (ref 39.0–52.0)
Hemoglobin: 10.6 g/dL — ABNORMAL LOW (ref 13.0–17.0)
MCH: 27.9 pg (ref 26.0–34.0)
MCHC: 29.6 g/dL — ABNORMAL LOW (ref 30.0–36.0)
MCV: 94.2 fL (ref 80.0–100.0)
Platelets: 158 10*3/uL (ref 150–400)
RBC: 3.8 MIL/uL — ABNORMAL LOW (ref 4.22–5.81)
RDW: 15.7 % — ABNORMAL HIGH (ref 11.5–15.5)
WBC: 5.2 10*3/uL (ref 4.0–10.5)
nRBC: 0 % (ref 0.0–0.2)

## 2023-11-11 LAB — BASIC METABOLIC PANEL
Anion gap: 10 (ref 5–15)
BUN: 54 mg/dL — ABNORMAL HIGH (ref 8–23)
CO2: 29 mmol/L (ref 22–32)
Calcium: 8.7 mg/dL — ABNORMAL LOW (ref 8.9–10.3)
Chloride: 104 mmol/L (ref 98–111)
Creatinine, Ser: 2.12 mg/dL — ABNORMAL HIGH (ref 0.61–1.24)
GFR, Estimated: 34 mL/min — ABNORMAL LOW (ref >60)
Glucose, Bld: 123 mg/dL — ABNORMAL HIGH (ref 70–99)
Potassium: 4 mmol/L (ref 3.5–5.1)
Sodium: 143 mmol/L (ref 135–145)

## 2023-11-11 LAB — HIV ANTIBODY (ROUTINE TESTING W REFLEX): HIV Screen 4th Generation wRfx: NONREACTIVE

## 2023-11-11 LAB — HEMOGLOBIN A1C
Hgb A1c MFr Bld: 9.3 % — ABNORMAL HIGH (ref 4.8–5.6)
Mean Plasma Glucose: 220.21 mg/dL

## 2023-11-11 LAB — PROTIME-INR
INR: 1.1 (ref 0.8–1.2)
Prothrombin Time: 14.6 s (ref 11.4–15.2)

## 2023-11-11 LAB — APTT: aPTT: 29 s (ref 24–36)

## 2023-11-11 MED ORDER — ENOXAPARIN SODIUM 100 MG/ML IJ SOSY
90.0000 mg | PREFILLED_SYRINGE | INTRAMUSCULAR | Status: DC
  Administered 2023-11-11 – 2023-11-13 (×3): 90 mg via SUBCUTANEOUS
  Filled 2023-11-11 (×3): qty 1

## 2023-11-11 MED ORDER — DIATRIZOATE MEGLUMINE & SODIUM 66-10 % PO SOLN
90.0000 mL | Freq: Once | ORAL | Status: AC
  Administered 2023-11-11: 90 mL via NASOGASTRIC
  Filled 2023-11-11: qty 90

## 2023-11-11 MED ORDER — DEXTROSE 50 % IV SOLN
12.5000 g | INTRAVENOUS | Status: AC
  Administered 2023-11-11: 12.5 g via INTRAVENOUS

## 2023-11-11 MED ORDER — PHENOL 1.4 % MT LIQD
1.0000 | OROMUCOSAL | Status: DC | PRN
  Filled 2023-11-11 (×2): qty 177

## 2023-11-11 MED ORDER — CYCLOSPORINE 0.05 % OP EMUL
1.0000 [drp] | Freq: Two times a day (BID) | OPHTHALMIC | Status: DC
  Administered 2023-11-11 – 2023-11-12 (×4): 1 [drp] via OPHTHALMIC
  Filled 2023-11-11 (×5): qty 30

## 2023-11-11 MED ORDER — DEXTROSE-SODIUM CHLORIDE 5-0.9 % IV SOLN: INTRAVENOUS | Status: AC

## 2023-11-11 MED ORDER — DEXTROSE 50 % IV SOLN
INTRAVENOUS | Status: AC
Start: 2023-11-11 — End: 2023-11-11
  Filled 2023-11-11: qty 50

## 2023-11-11 NOTE — Progress Notes (Signed)
   11/11/23 1337  TOC Brief Assessment  Insurance and Status Reviewed  Patient has primary care physician Yes  Home environment has been reviewed home  Prior level of function: independent  Prior/Current Home Services No current home services  Social Drivers of Health Review SDOH reviewed no interventions necessary  Readmission risk has been reviewed Yes  Transition of care needs no transition of care needs at this time

## 2023-11-11 NOTE — Progress Notes (Signed)
 Hypoglycemic Event  CBG: 66  Treatment: D50 25 mL (12.5 gm)  Symptoms: tired  Follow-up CBG: Time:0257 CBG Result:102  Possible Reasons for Event: Inadequate meal intake  Comments/MD notified:MD notified for IVF orders.     Priscille Kluver

## 2023-11-11 NOTE — Progress Notes (Signed)
   11/11/23 2300  BiPAP/CPAP/SIPAP  Reason BIPAP/CPAP not in use Non-compliant (Pt refused and states he does not use any at home.)

## 2023-11-11 NOTE — Progress Notes (Signed)
 Subjective/Chief Complaint: + flatus.  Still feels bloated. NGT not recorded.     Objective: Vital signs in last 24 hours: Temp:  [97.6 F (36.4 C)-98.4 F (36.9 C)] 97.7 F (36.5 C) (02/22 0558) Pulse Rate:  [51-65] 63 (02/22 0558) Resp:  [12-23] 15 (02/22 0049) BP: (96-142)/(53-120) 124/72 (02/22 0558) SpO2:  [91 %-100 %] 94 % (02/22 0558) Weight:  [181.4 kg] 181.4 kg (02/21 1732) Last BM Date : 11/08/23  Intake/Output from previous day: 02/21 0701 - 02/22 0700 In: 903.7 [I.V.:843.7; NG/GT:60] Out: 1000 [Urine:1000] Intake/Output this shift: No intake/output data recorded.  Gen:  sleeping, easily awakened.  Resp;  breathing comfortably Abd:  soft, protuberant, soft umbilical hernia, non tender.  Ext:  tr edema NGT 200-250 mL bilious output.    Lab Results:  Recent Labs    11/10/23 1330  WBC 5.9  HGB 11.5*  HCT 37.8*  PLT 171   BMET Recent Labs    11/10/23 1330  NA 141  K 3.5  CL 102  CO2 28  GLUCOSE 112*  BUN 61*  CREATININE 2.24*  CALCIUM 9.2   PT/INR No results for input(s): "LABPROT", "INR" in the last 72 hours. ABG No results for input(s): "PHART", "HCO3" in the last 72 hours.  Invalid input(s): "PCO2", "PO2"  Studies/Results: DG Abd 1 View Result Date: 11/10/2023 CLINICAL DATA:  NG tube EXAM: ABDOMEN - 1 VIEW COMPARISON:  Abdominal series 11/05/2019. CT abdomen and pelvis 11/10/2023. FINDINGS: Enteric tube tip is in the proximal body of the stomach. There are few dilated small bowel loops in the upper abdomen compatible with small bowel obstruction as seen on recent CT. Air seen throughout nondilated colon to the level the rectum. Bilateral hip screws are present. There severe degenerative changes of the hip. IMPRESSION: 1. Enteric tube tip is in the proximal body of the stomach. Recommended advancing 5 cm. 2. Dilated small bowel loops in the upper abdomen compatible with small bowel obstruction as seen on recent CT. Electronically Signed    By: Darliss Cheney M.D.   On: 11/10/2023 21:09   DG CHEST PORT 1 VIEW Result Date: 11/10/2023 CLINICAL DATA:  Preop EXAM: PORTABLE CHEST 1 VIEW COMPARISON:  11/03/2022 FINDINGS: Heart and mediastinal contours are within normal limits. No focal opacities or effusions. No acute bony abnormality. NG tube enters the stomach. IMPRESSION: No active disease. Electronically Signed   By: Charlett Nose M.D.   On: 11/10/2023 21:02   CT ABDOMEN PELVIS W CONTRAST Result Date: 11/10/2023 CLINICAL DATA:  Acute onset abdominal pain EXAM: CT ABDOMEN AND PELVIS WITH CONTRAST TECHNIQUE: Multidetector CT imaging of the abdomen and pelvis was performed using the standard protocol following bolus administration of intravenous contrast. RADIATION DOSE REDUCTION: This exam was performed according to the departmental dose-optimization program which includes automated exposure control, adjustment of the mA and/or kV according to patient size and/or use of iterative reconstruction technique. CONTRAST:  OMNIPAQUE IOHEXOL 300 MG/ML  SOLN COMPARISON:  CT abdomen and pelvis dated 12/01/2022 FINDINGS: Lower chest: Bilateral lower lobe bronchiectasis with lateral left lower lobe basilar bronchial mucous plugging. No pleural effusion or pneumothorax demonstrated. Partially imaged heart size is normal. Coronary artery calcifications. Hepatobiliary: Mildly nodular hepatic contour. No intra or extrahepatic biliary ductal dilation. Cholelithiasis. Pancreas: No focal lesions or main ductal dilation. Spleen: Normal in size without focal abnormality. Adrenals/Urinary Tract: No adrenal nodules. No suspicious renal mass, calculi or hydronephrosis. No focal bladder wall thickening. Stomach/Bowel: Normal appearance of the stomach. Gastric band  in-situ. Diffuse small bowel dilation with multiple points of tethering along the anterior abdominal surgical site. Abrupt caliber transition deep to the superior aspect of the midline incision (2:54, 8:50).  Bowel distal to this point is largely decompressed. Colonic diverticulosis without acute diverticulitis. Normal appendix. Vascular/Lymphatic: Aortic atherosclerosis. No enlarged abdominal or pelvic lymph nodes. Reproductive: Prostate is unremarkable. Other: No free fluid, fluid collection, or free air. Musculoskeletal: No acute or abnormal lytic or blastic osseous lesions. Postsurgical changes of the anterior abdominal wall. Ventral midline abdominal hernia containing fat and a loop of non obstructed transverse colon. Postsurgical changes of bilateral proximal femurs. Severe degenerative changes of the right hip. Multilevel degenerative changes of the partially imaged thoracic and lumbar spine. IMPRESSION: 1. Small-bowel obstruction with abrupt caliber transition deep to the superior aspect of the midline incision. 2. Bilateral lower lobe bronchiectasis with lateral left lower lobe basilar bronchial mucous plugging. 3. Mildly nodular hepatic contour, which can be seen in the setting of cirrhosis. 4. Cholelithiasis. 5. Aortic Atherosclerosis (ICD10-I70.0). Coronary artery calcifications. Assessment for potential risk factor modification, dietary therapy or pharmacologic therapy may be warranted, if clinically indicated. Electronically Signed   By: Agustin Cree M.D.   On: 11/10/2023 16:53    Anti-infectives: Anti-infectives (From admission, onward)    None       Assessment/Plan: s/p * No surgery found *   SBO, likely adhesive Chronically incarcerated umbilical hernia- no change, not source of obstruction  Passing some gas today.  LMW heparin for vte ppx. NPO  SBO protocol today.    LOS: 1 day    Almond Lint 11/11/2023

## 2023-11-11 NOTE — Progress Notes (Signed)
 PROGRESS NOTE  [name]  UJW:[id number] DOB: 09-Jul-1956 DOA: 11/10/2023 PCP: Estevan Oaks, NP   Brief Narrative: Patient is a 68 year old male with history of multiple abdominal surgeries, umbilical  hernia presented with abdominal pain, vomiting, no passing of gas or bowel movement.  CT abdomen/pelvis showed SBO with abrupt caliber transition deep to the superior aspect of the midline incision.  Also showed signs of bilateral lower lobe bronchiectasis, cirrhosis.  General surgery consulted and following.  Currently being managed for SBO  Assessment & Plan:  Principal Problem:   SBO (small bowel obstruction) (HCC) Active Problems:   CKD (chronic kidney disease)   Bronchiectasis (HCC)   SBO: History of multiple abdominal surgeries.  Also has abdominal/epigastric hernia.  Presented with abdomen pain, vomiting, lack of bowel movement.  Started on IV fluids.  Currently NPO.  Continue pain management, antiemetics per general surgery following.  Currently on conservative management.  Plan for SBO protocol today.  Patient is passing gas  CKD stage IIIb-4: Baseline creatinine 2-2.5.  Currently kidney function at baseline.  Takes Lasix/metolazone at home, currently on hold  Diabetes type 2: On sliding scale insulin.  Takes Jardiance, insulin  Bronchiectasis: Incidentally seen on CT scan.  Currently on room air.  Chest x-ray did not show any acute findings.  Denies shortness of breath or cough  History of gout: Takes allopurinol, colchicine at home  History of hyperlipidemia: Lipitor  History of hypertension: On bisoprolol, losartan on hold.  Currently on hold  Morbid obesity/debility: BMI 52.  Ambulates with the help of walker/wheelchair.  Lives alone        DVT prophylaxis:SCDs Start: 11/10/23 1731     Code Status: Full Code  Family Communication: None at bedside  Patient status:Inpatient  Patient is from :Home  Anticipated discharge FA:OZHY  Estimated DC date:1-2  days   Consultants: General Surgery  Procedures:None  Antimicrobials:  Anti-infectives (From admission, onward)    None       Subjective: Patient seen and examined at bedside today.  Hemodynamically stable.  Lying in bed.  Comfortable.  Denies any abdomen pain, nausea or vomiting today.  Passing gas.  Objective: Vitals:   11/10/23 2016 11/10/23 2140 11/11/23 0049 11/11/23 0558  BP: 117/66  (!) 96/53 124/72  Pulse: 62  62 63  Resp:   15   Temp: 97.8 F (36.6 C)  98.4 F (36.9 C) 97.7 F (36.5 C)  TempSrc: Oral  Oral Oral  SpO2: 95% 94% 100% 94%  Weight:      Height:        Intake/Output Summary (Last 24 hours) at 11/11/2023 0750 Last data filed at 11/11/2023 0347 Gross per 24 hour  Intake 903.69 ml  Output 1000 ml  Net -96.31 ml   Filed Weights   11/10/23 1732  Weight: (!) 181.4 kg    Examination:  General exam: Overall comfortable, not in distress, morbidly obese HEENT: PERRL, NG tube Respiratory system:  no wheezes or crackles  Cardiovascular system: S1 & S2 heard, RRR.  Gastrointestinal system: Abdomen is nondistended, soft and nontender.  Umbilical hernia Central nervous system: Alert and oriented Extremities: No edema, no clubbing ,no cyanosis Skin: No rashes, no ulcers,no icterus     Data Reviewed: I have personally reviewed following labs and imaging studies  CBC: Recent Labs  Lab 11/10/23 1330  WBC 5.9  HGB 11.5*  HCT 37.8*  MCV 92.6  PLT 171   Basic Metabolic Panel: Recent Labs  Lab 11/10/23 1330  NA  141  K 3.5  CL 102  CO2 28  GLUCOSE 112*  BUN 61*  CREATININE 2.24*  CALCIUM 9.2     No results found for this or any previous visit (from the past 240 hours).   Radiology Studies: DG Abd 1 View Result Date: 11/10/2023 CLINICAL DATA:  NG tube EXAM: ABDOMEN - 1 VIEW COMPARISON:  Abdominal series 11/05/2019. CT abdomen and pelvis 11/10/2023. FINDINGS: Enteric tube tip is in the proximal body of the stomach. There are few dilated  small bowel loops in the upper abdomen compatible with small bowel obstruction as seen on recent CT. Air seen throughout nondilated colon to the level the rectum. Bilateral hip screws are present. There severe degenerative changes of the hip. IMPRESSION: 1. Enteric tube tip is in the proximal body of the stomach. Recommended advancing 5 cm. 2. Dilated small bowel loops in the upper abdomen compatible with small bowel obstruction as seen on recent CT. Electronically Signed   By: Darliss Cheney M.D.   On: 11/10/2023 21:09   DG CHEST PORT 1 VIEW Result Date: 11/10/2023 CLINICAL DATA:  Preop EXAM: PORTABLE CHEST 1 VIEW COMPARISON:  11/03/2022 FINDINGS: Heart and mediastinal contours are within normal limits. No focal opacities or effusions. No acute bony abnormality. NG tube enters the stomach. IMPRESSION: No active disease. Electronically Signed   By: Charlett Nose M.D.   On: 11/10/2023 21:02   CT ABDOMEN PELVIS W CONTRAST Result Date: 11/10/2023 CLINICAL DATA:  Acute onset abdominal pain EXAM: CT ABDOMEN AND PELVIS WITH CONTRAST TECHNIQUE: Multidetector CT imaging of the abdomen and pelvis was performed using the standard protocol following bolus administration of intravenous contrast. RADIATION DOSE REDUCTION: This exam was performed according to the departmental dose-optimization program which includes automated exposure control, adjustment of the mA and/or kV according to patient size and/or use of iterative reconstruction technique. CONTRAST:  OMNIPAQUE IOHEXOL 300 MG/ML  SOLN COMPARISON:  CT abdomen and pelvis dated 12/01/2022 FINDINGS: Lower chest: Bilateral lower lobe bronchiectasis with lateral left lower lobe basilar bronchial mucous plugging. No pleural effusion or pneumothorax demonstrated. Partially imaged heart size is normal. Coronary artery calcifications. Hepatobiliary: Mildly nodular hepatic contour. No intra or extrahepatic biliary ductal dilation. Cholelithiasis. Pancreas: No focal lesions  or main ductal dilation. Spleen: Normal in size without focal abnormality. Adrenals/Urinary Tract: No adrenal nodules. No suspicious renal mass, calculi or hydronephrosis. No focal bladder wall thickening. Stomach/Bowel: Normal appearance of the stomach. Gastric band in-situ. Diffuse small bowel dilation with multiple points of tethering along the anterior abdominal surgical site. Abrupt caliber transition deep to the superior aspect of the midline incision (2:54, 8:50). Bowel distal to this point is largely decompressed. Colonic diverticulosis without acute diverticulitis. Normal appendix. Vascular/Lymphatic: Aortic atherosclerosis. No enlarged abdominal or pelvic lymph nodes. Reproductive: Prostate is unremarkable. Other: No free fluid, fluid collection, or free air. Musculoskeletal: No acute or abnormal lytic or blastic osseous lesions. Postsurgical changes of the anterior abdominal wall. Ventral midline abdominal hernia containing fat and a loop of non obstructed transverse colon. Postsurgical changes of bilateral proximal femurs. Severe degenerative changes of the right hip. Multilevel degenerative changes of the partially imaged thoracic and lumbar spine. IMPRESSION: 1. Small-bowel obstruction with abrupt caliber transition deep to the superior aspect of the midline incision. 2. Bilateral lower lobe bronchiectasis with lateral left lower lobe basilar bronchial mucous plugging. 3. Mildly nodular hepatic contour, which can be seen in the setting of cirrhosis. 4. Cholelithiasis. 5. Aortic Atherosclerosis (ICD10-I70.0). Coronary artery calcifications. Assessment for  potential risk factor modification, dietary therapy or pharmacologic therapy may be warranted, if clinically indicated. Electronically Signed   By: Agustin Cree M.D.   On: 11/10/2023 16:53    Scheduled Meds:  enoxaparin (LOVENOX) injection  90 mg Subcutaneous Q24H   insulin aspart  0-15 Units Subcutaneous Q4H   pantoprazole (PROTONIX) IV  40 mg  Intravenous Q24H   sodium chloride flush  3 mL Intravenous Q12H   sodium chloride HYPERTONIC  4 mL Nebulization BID   Continuous Infusions:  dextrose 5 % and 0.9 % NaCl 100 mL/hr at 11/11/23 0310     LOS: 1 day   [name], MD [facility] P2/22/2025, 7:50 AM

## 2023-11-11 NOTE — Progress Notes (Signed)
 RT unavailable for 1200 CPT.

## 2023-11-12 ENCOUNTER — Inpatient Hospital Stay (HOSPITAL_COMMUNITY): Payer: 59

## 2023-11-12 DIAGNOSIS — K56609 Unspecified intestinal obstruction, unspecified as to partial versus complete obstruction: Secondary | ICD-10-CM | POA: Diagnosis not present

## 2023-11-12 LAB — BASIC METABOLIC PANEL
Anion gap: 9 (ref 5–15)
BUN: 38 mg/dL — ABNORMAL HIGH (ref 8–23)
CO2: 29 mmol/L (ref 22–32)
Calcium: 9.1 mg/dL (ref 8.9–10.3)
Chloride: 109 mmol/L (ref 98–111)
Creatinine, Ser: 1.86 mg/dL — ABNORMAL HIGH (ref 0.61–1.24)
GFR, Estimated: 39 mL/min — ABNORMAL LOW (ref >60)
Glucose, Bld: 180 mg/dL — ABNORMAL HIGH (ref 70–99)
Potassium: 4.7 mmol/L (ref 3.5–5.1)
Sodium: 147 mmol/L — ABNORMAL HIGH (ref 135–145)

## 2023-11-12 LAB — GLUCOSE, CAPILLARY
Glucose-Capillary: 148 mg/dL — ABNORMAL HIGH (ref 70–99)
Glucose-Capillary: 148 mg/dL — ABNORMAL HIGH (ref 70–99)
Glucose-Capillary: 231 mg/dL — ABNORMAL HIGH (ref 70–99)
Glucose-Capillary: 206 mg/dL — ABNORMAL HIGH (ref 70–99)
Glucose-Capillary: 133 mg/dL — ABNORMAL HIGH (ref 70–99)

## 2023-11-12 MED ORDER — INSULIN ASPART 100 UNIT/ML IJ SOLN
0.0000 [IU] | INTRAMUSCULAR | Status: DC
  Administered 2023-11-12 (×2): 5 [IU] via SUBCUTANEOUS
  Administered 2023-11-12 – 2023-11-13 (×3): 2 [IU] via SUBCUTANEOUS

## 2023-11-12 MED ORDER — BOOST / RESOURCE BREEZE PO LIQD CUSTOM
1.0000 | Freq: Three times a day (TID) | ORAL | Status: DC
  Administered 2023-11-12 – 2023-11-13 (×2): 1 via ORAL

## 2023-11-12 MED ORDER — SODIUM CHLORIDE 0.45 % IV SOLN: INTRAVENOUS | Status: DC

## 2023-11-12 NOTE — Progress Notes (Signed)
 PROGRESS NOTE  Keldric Poyer  ZOX:[id number] DOB: 1956-01-22 DOA: 11/10/2023 PCP: Estevan Oaks, NP   Brief Narrative: Patient is a 68 year old male with history of multiple abdominal surgeries, umbilical  hernia presented with abdominal pain, vomiting, no passing of gas or bowel movement.  CT abdomen/pelvis showed SBO with abrupt caliber transition deep to the superior aspect of the midline incision.  Also showed signs of bilateral lower lobe bronchiectasis, cirrhosis.  General surgery consulted and following.  Currently being managed for SBO  Assessment & Plan:  Principal Problem:   SBO (small bowel obstruction) (HCC) Active Problems:   CKD (chronic kidney disease)   Bronchiectasis (HCC)   SBO: History of multiple abdominal surgeries.  Also has abdominal/epigastric hernia.  Presented with abdomen pain, vomiting, lack of bowel movement.  Started on IV fluids.  Abdominal x-ray SBO protocol showed contrast into the colon.  Resolving SBO.  Started on clear liquid diet.  Advance to full liquid later today.   CKD stage IIIb-4: Baseline creatinine 2-2.5.  Currently kidney function at baseline.  Takes Lasix/metolazone at home, currently on hold  Diabetes type 2: On sliding scale insulin.  Takes Jardiance, insulin  Bronchiectasis: Incidentally seen on CT scan.  Currently on room air.  Chest x-ray did not show any acute findings.  Denies shortness of breath or cough  History of gout: Takes allopurinol, colchicine at home  History of hyperlipidemia: Lipitor  History of hypertension: On bisoprolol, losartan on hold.  Currently on hold  Morbid obesity/debility: BMI 52.  Ambulates with the help of walker/wheelchair.  Lives alone        DVT prophylaxis:SCDs Start: 11/10/23 1731     Code Status: Full Code  Family Communication: None at bedside  Patient status:Inpatient  Patient is from :Home  Anticipated discharge WJ:XBJY  Estimated DC date: Tomorrow   Consultants: General  Surgery  Procedures:None  Antimicrobials:  Anti-infectives (From admission, onward)    None       Subjective: Patient seen and examined at bedside today.  Hemodynamically stable.  Having liquidy bowel movements.  No abdominal pain, nausea or vomiting. Eager to start diet  Objective: Vitals:   11/11/23 0817 11/11/23 1400 11/11/23 2157 11/12/23 0508  BP: (!) 116/57 117/68 125/65 135/80  Pulse: 62 63 70 83  Resp: 16 16 18 17   Temp: 97.7 F (36.5 C) 98 F (36.7 C) 98.1 F (36.7 C) 98.2 F (36.8 C)  TempSrc: Oral Oral Oral Oral  SpO2: 96% 94% 98% 99%  Weight:      Height:        Intake/Output Summary (Last 24 hours) at 11/12/2023 1101 Last data filed at 11/12/2023 1000 Gross per 24 hour  Intake 781.37 ml  Output 2658 ml  Net -1876.63 ml   Filed Weights   11/10/23 1732  Weight: (!) 181.4 kg    Examination:  General exam: Overall comfortable, not in distress, morbidly obese HEENT: PERRL Respiratory system:  no wheezes or crackles  Cardiovascular system: S1 & S2 heard, RRR.  Gastrointestinal system: Abdomen is nondistended, soft and nontender.  Umbilical hernia Central nervous system: Alert and oriented Extremities: No edema, no clubbing ,no cyanosis Skin: No rashes, no ulcers,no icterus     Data Reviewed: I have personally reviewed following labs and imaging studies  CBC: Recent Labs  Lab 11/10/23 1330 11/11/23 0809  WBC 5.9 5.2  HGB 11.5* 10.6*  HCT 37.8* 35.8*  MCV 92.6 94.2  PLT 171 158   Basic Metabolic Panel: Recent Labs  Lab 11/10/23 1330 11/11/23 0809 11/12/23 0651  NA 141 143 147*  K 3.5 4.0 4.7  CL 102 104 109  CO2 28 29 29   GLUCOSE 112* 123* 180*  BUN 61* 54* 38*  CREATININE 2.24* 2.12* 1.86*  CALCIUM 9.2 8.7* 9.1     No results found for this or any previous visit (from the past 240 hours).   Radiology Studies: DG Abd Portable 1V-Small Bowel Obstruction Protocol-24 hr delay Result Date: 11/12/2023 CLINICAL DATA:  Small-bowel  obstruction, 24 hour delay EXAM: PORTABLE ABDOMEN - 1 VIEW COMPARISON:  Abdominal radiograph dated 11/11/2023 FINDINGS: Interval removal of enteric tube.  Gastric lap band in-situ. Contrast material has reached the rectum. A few loops of mildly dilated small bowel remain within the left upper quadrant. Partially imaged bilateral proximal femoral fixation hardware appears intact. Severe degenerative changes of the right hip. IMPRESSION: Contrast material has reached the rectum. A few loops of mildly dilated small bowel remain within the left upper quadrant. Electronically Signed   By: Agustin Cree M.D.   On: 11/12/2023 10:44   DG Abd Portable 1V-Small Bowel Obstruction Protocol-initial, 8 hr delay Result Date: 11/11/2023 CLINICAL DATA:  Small bowel obstruction, 8 hour delay EXAM: PORTABLE ABDOMEN - 1 VIEW COMPARISON:  CT 11/10/2023. FINDINGS: Oral contrast material is noted throughout the colon. Continued mildly dilated central small bowel loops. IMPRESSION: Oral contrast material throughout the colon. Mildly dilated central small bowel loops. Electronically Signed   By: Charlett Nose M.D.   On: 11/11/2023 20:45   DG Abd 1 View Result Date: 11/10/2023 CLINICAL DATA:  NG tube EXAM: ABDOMEN - 1 VIEW COMPARISON:  Abdominal series 11/05/2019. CT abdomen and pelvis 11/10/2023. FINDINGS: Enteric tube tip is in the proximal body of the stomach. There are few dilated small bowel loops in the upper abdomen compatible with small bowel obstruction as seen on recent CT. Air seen throughout nondilated colon to the level the rectum. Bilateral hip screws are present. There severe degenerative changes of the hip. IMPRESSION: 1. Enteric tube tip is in the proximal body of the stomach. Recommended advancing 5 cm. 2. Dilated small bowel loops in the upper abdomen compatible with small bowel obstruction as seen on recent CT. Electronically Signed   By: Darliss Cheney M.D.   On: 11/10/2023 21:09   DG CHEST PORT 1 VIEW Result Date:  11/10/2023 CLINICAL DATA:  Preop EXAM: PORTABLE CHEST 1 VIEW COMPARISON:  11/03/2022 FINDINGS: Heart and mediastinal contours are within normal limits. No focal opacities or effusions. No acute bony abnormality. NG tube enters the stomach. IMPRESSION: No active disease. Electronically Signed   By: Charlett Nose M.D.   On: 11/10/2023 21:02   CT ABDOMEN PELVIS W CONTRAST Result Date: 11/10/2023 CLINICAL DATA:  Acute onset abdominal pain EXAM: CT ABDOMEN AND PELVIS WITH CONTRAST TECHNIQUE: Multidetector CT imaging of the abdomen and pelvis was performed using the standard protocol following bolus administration of intravenous contrast. RADIATION DOSE REDUCTION: This exam was performed according to the departmental dose-optimization program which includes automated exposure control, adjustment of the mA and/or kV according to patient size and/or use of iterative reconstruction technique. CONTRAST:  OMNIPAQUE IOHEXOL 300 MG/ML  SOLN COMPARISON:  CT abdomen and pelvis dated 12/01/2022 FINDINGS: Lower chest: Bilateral lower lobe bronchiectasis with lateral left lower lobe basilar bronchial mucous plugging. No pleural effusion or pneumothorax demonstrated. Partially imaged heart size is normal. Coronary artery calcifications. Hepatobiliary: Mildly nodular hepatic contour. No intra or extrahepatic biliary ductal dilation. Cholelithiasis. Pancreas:  No focal lesions or main ductal dilation. Spleen: Normal in size without focal abnormality. Adrenals/Urinary Tract: No adrenal nodules. No suspicious renal mass, calculi or hydronephrosis. No focal bladder wall thickening. Stomach/Bowel: Normal appearance of the stomach. Gastric band in-situ. Diffuse small bowel dilation with multiple points of tethering along the anterior abdominal surgical site. Abrupt caliber transition deep to the superior aspect of the midline incision (2:54, 8:50). Bowel distal to this point is largely decompressed. Colonic diverticulosis without  acute diverticulitis. Normal appendix. Vascular/Lymphatic: Aortic atherosclerosis. No enlarged abdominal or pelvic lymph nodes. Reproductive: Prostate is unremarkable. Other: No free fluid, fluid collection, or free air. Musculoskeletal: No acute or abnormal lytic or blastic osseous lesions. Postsurgical changes of the anterior abdominal wall. Ventral midline abdominal hernia containing fat and a loop of non obstructed transverse colon. Postsurgical changes of bilateral proximal femurs. Severe degenerative changes of the right hip. Multilevel degenerative changes of the partially imaged thoracic and lumbar spine. IMPRESSION: 1. Small-bowel obstruction with abrupt caliber transition deep to the superior aspect of the midline incision. 2. Bilateral lower lobe bronchiectasis with lateral left lower lobe basilar bronchial mucous plugging. 3. Mildly nodular hepatic contour, which can be seen in the setting of cirrhosis. 4. Cholelithiasis. 5. Aortic Atherosclerosis (ICD10-I70.0). Coronary artery calcifications. Assessment for potential risk factor modification, dietary therapy or pharmacologic therapy may be warranted, if clinically indicated. Electronically Signed   By: Agustin Cree M.D.   On: 11/10/2023 16:53    Scheduled Meds:  cycloSPORINE  1 drop Both Eyes BID   enoxaparin (LOVENOX) injection  90 mg Subcutaneous Q24H   feeding supplement  1 Container Oral TID BM   insulin aspart  0-15 Units Subcutaneous Q4H   pantoprazole (PROTONIX) IV  40 mg Intravenous Q24H   sodium chloride flush  3 mL Intravenous Q12H   Continuous Infusions:  sodium chloride 50 mL/hr at 11/12/23 0937     LOS: 2 days   [name], MD [facility] P2/23/2025, 11:01 AM

## 2023-11-12 NOTE — Progress Notes (Signed)
 RT unavailable at scheduled CPT time. Patient sleeping currently, CPT not performed.

## 2023-11-12 NOTE — Progress Notes (Signed)
 Subjective/Chief Complaint: + multiple BMs and continued flatus.  "I'm starving."    Objective: Vital signs in last 24 hours: Temp:  [98 F (36.7 C)-98.2 F (36.8 C)] 98.2 F (36.8 C) (02/23 0508) Pulse Rate:  [63-83] 83 (02/23 0508) Resp:  [16-18] 17 (02/23 0508) BP: (117-135)/(65-80) 135/80 (02/23 0508) SpO2:  [94 %-99 %] 99 % (02/23 0508) Last BM Date : 11/08/23  Intake/Output from previous day: 02/22 0701 - 02/23 0700 In: 808.4 [I.V.:778.4; NG/GT:30] Out: 2200 [Urine:1900; Emesis/NG output:300] Intake/Output this shift: Total I/O In: -  Out: 100 [Urine:100]  Gen:  alert, sitting on side of bed.  NGT pulled out significantly to where it was yesterday.  Resp;  breathing comfortably Abd:  soft, protuberant, soft umbilical hernia, non tender.  Ext:  tr edema NGT - bilious output.    Lab Results:  Recent Labs    11/10/23 1330 11/11/23 0809  WBC 5.9 5.2  HGB 11.5* 10.6*  HCT 37.8* 35.8*  PLT 171 158   BMET Recent Labs    11/11/23 0809 11/12/23 0651  NA 143 147*  K 4.0 4.7  CL 104 109  CO2 29 29  GLUCOSE 123* 180*  BUN 54* 38*  CREATININE 2.12* 1.86*  CALCIUM 8.7* 9.1   PT/INR Recent Labs    11/11/23 0809  LABPROT 14.6  INR 1.1   ABG No results for input(s): "PHART", "HCO3" in the last 72 hours.  Invalid input(s): "PCO2", "PO2"  Studies/Results: DG Abd Portable 1V-Small Bowel Obstruction Protocol-initial, 8 hr delay Result Date: 11/11/2023 CLINICAL DATA:  Small bowel obstruction, 8 hour delay EXAM: PORTABLE ABDOMEN - 1 VIEW COMPARISON:  CT 11/10/2023. FINDINGS: Oral contrast material is noted throughout the colon. Continued mildly dilated central small bowel loops. IMPRESSION: Oral contrast material throughout the colon. Mildly dilated central small bowel loops. Electronically Signed   By: Charlett Nose M.D.   On: 11/11/2023 20:45   DG Abd 1 View Result Date: 11/10/2023 CLINICAL DATA:  NG tube EXAM: ABDOMEN - 1 VIEW COMPARISON:  Abdominal  series 11/05/2019. CT abdomen and pelvis 11/10/2023. FINDINGS: Enteric tube tip is in the proximal body of the stomach. There are few dilated small bowel loops in the upper abdomen compatible with small bowel obstruction as seen on recent CT. Air seen throughout nondilated colon to the level the rectum. Bilateral hip screws are present. There severe degenerative changes of the hip. IMPRESSION: 1. Enteric tube tip is in the proximal body of the stomach. Recommended advancing 5 cm. 2. Dilated small bowel loops in the upper abdomen compatible with small bowel obstruction as seen on recent CT. Electronically Signed   By: Darliss Cheney M.D.   On: 11/10/2023 21:09   DG CHEST PORT 1 VIEW Result Date: 11/10/2023 CLINICAL DATA:  Preop EXAM: PORTABLE CHEST 1 VIEW COMPARISON:  11/03/2022 FINDINGS: Heart and mediastinal contours are within normal limits. No focal opacities or effusions. No acute bony abnormality. NG tube enters the stomach. IMPRESSION: No active disease. Electronically Signed   By: Charlett Nose M.D.   On: 11/10/2023 21:02   CT ABDOMEN PELVIS W CONTRAST Result Date: 11/10/2023 CLINICAL DATA:  Acute onset abdominal pain EXAM: CT ABDOMEN AND PELVIS WITH CONTRAST TECHNIQUE: Multidetector CT imaging of the abdomen and pelvis was performed using the standard protocol following bolus administration of intravenous contrast. RADIATION DOSE REDUCTION: This exam was performed according to the departmental dose-optimization program which includes automated exposure control, adjustment of the mA and/or kV according to patient size and/or  use of iterative reconstruction technique. CONTRAST:  OMNIPAQUE IOHEXOL 300 MG/ML  SOLN COMPARISON:  CT abdomen and pelvis dated 12/01/2022 FINDINGS: Lower chest: Bilateral lower lobe bronchiectasis with lateral left lower lobe basilar bronchial mucous plugging. No pleural effusion or pneumothorax demonstrated. Partially imaged heart size is normal. Coronary artery  calcifications. Hepatobiliary: Mildly nodular hepatic contour. No intra or extrahepatic biliary ductal dilation. Cholelithiasis. Pancreas: No focal lesions or main ductal dilation. Spleen: Normal in size without focal abnormality. Adrenals/Urinary Tract: No adrenal nodules. No suspicious renal mass, calculi or hydronephrosis. No focal bladder wall thickening. Stomach/Bowel: Normal appearance of the stomach. Gastric band in-situ. Diffuse small bowel dilation with multiple points of tethering along the anterior abdominal surgical site. Abrupt caliber transition deep to the superior aspect of the midline incision (2:54, 8:50). Bowel distal to this point is largely decompressed. Colonic diverticulosis without acute diverticulitis. Normal appendix. Vascular/Lymphatic: Aortic atherosclerosis. No enlarged abdominal or pelvic lymph nodes. Reproductive: Prostate is unremarkable. Other: No free fluid, fluid collection, or free air. Musculoskeletal: No acute or abnormal lytic or blastic osseous lesions. Postsurgical changes of the anterior abdominal wall. Ventral midline abdominal hernia containing fat and a loop of non obstructed transverse colon. Postsurgical changes of bilateral proximal femurs. Severe degenerative changes of the right hip. Multilevel degenerative changes of the partially imaged thoracic and lumbar spine. IMPRESSION: 1. Small-bowel obstruction with abrupt caliber transition deep to the superior aspect of the midline incision. 2. Bilateral lower lobe bronchiectasis with lateral left lower lobe basilar bronchial mucous plugging. 3. Mildly nodular hepatic contour, which can be seen in the setting of cirrhosis. 4. Cholelithiasis. 5. Aortic Atherosclerosis (ICD10-I70.0). Coronary artery calcifications. Assessment for potential risk factor modification, dietary therapy or pharmacologic therapy may be warranted, if clinically indicated. Electronically Signed   By: Agustin Cree M.D.   On: 11/10/2023 16:53     Anti-infectives: Anti-infectives (From admission, onward)    None       Assessment/Plan: s/p * No surgery found *   SBO, likely adhesive Chronically incarcerated umbilical hernia- no change, not source of obstruction  Contrast advanced into colon on delayed film indicating resolving SBO D/c NGT and advance diet as tolerated.   LMW heparin for vte ppx.    LOS: 2 days    Almond Lint 11/12/2023

## 2023-11-13 DIAGNOSIS — K56609 Unspecified intestinal obstruction, unspecified as to partial versus complete obstruction: Secondary | ICD-10-CM | POA: Diagnosis not present

## 2023-11-13 LAB — BASIC METABOLIC PANEL
Anion gap: 10 (ref 5–15)
BUN: 26 mg/dL — ABNORMAL HIGH (ref 8–23)
CO2: 25 mmol/L (ref 22–32)
Calcium: 8.5 mg/dL — ABNORMAL LOW (ref 8.9–10.3)
Chloride: 108 mmol/L (ref 98–111)
Creatinine, Ser: 1.6 mg/dL — ABNORMAL HIGH (ref 0.61–1.24)
GFR, Estimated: 47 mL/min — ABNORMAL LOW (ref >60)
Glucose, Bld: 107 mg/dL — ABNORMAL HIGH (ref 70–99)
Potassium: 3.9 mmol/L (ref 3.5–5.1)
Sodium: 143 mmol/L (ref 135–145)

## 2023-11-13 LAB — GLUCOSE, CAPILLARY
Glucose-Capillary: 107 mg/dL — ABNORMAL HIGH (ref 70–99)
Glucose-Capillary: 123 mg/dL — ABNORMAL HIGH (ref 70–99)
Glucose-Capillary: 99 mg/dL (ref 70–99)

## 2023-11-13 MED ORDER — FUROSEMIDE 20 MG PO TABS: 60.0000 mg | ORAL_TABLET | Freq: Two times a day (BID) | ORAL | Status: DC

## 2023-11-13 NOTE — Progress Notes (Signed)
 Progress Note     Subjective: Pt tolerating CLD and having loose BMs. Still has some generalized soreness but overall improving. Had >4 BM yesterday - discussed that advancing diet may help some with this.   Objective: Vital signs in last 24 hours: Temp:  [98.5 F (36.9 C)-98.8 F (37.1 C)] 98.6 F (37 C) (02/24 0511) Pulse Rate:  [67-69] 69 (02/24 0511) Resp:  [16-18] 18 (02/24 0511) BP: (122-136)/(57-72) 129/71 (02/24 0511) SpO2:  [93 %-100 %] 93 % (02/24 0511) Last BM Date : 11/12/23  Intake/Output from previous day: 02/23 0701 - 02/24 0700 In: 1287.2 [P.O.:240; I.V.:1047.2] Out: 1738 [Urine:1738] Intake/Output this shift: No intake/output data recorded.  PE: Gen:  alert, NAD Resp;  breathing comfortably Abd:  soft, protuberant, soft umbilical hernia, non tender.    Lab Results:  Recent Labs    11/10/23 1330 11/11/23 0809  WBC 5.9 5.2  HGB 11.5* 10.6*  HCT 37.8* 35.8*  PLT 171 158   BMET Recent Labs    11/12/23 0651 11/13/23 0527  NA 147* 143  K 4.7 3.9  CL 109 108  CO2 29 25  GLUCOSE 180* 107*  BUN 38* 26*  CREATININE 1.86* 1.60*  CALCIUM 9.1 8.5*   PT/INR Recent Labs    11/11/23 0809  LABPROT 14.6  INR 1.1   CMP     Component Value Date/Time   NA 143 11/13/2023 0527   NA 140 06/19/2020 0000   K 3.9 11/13/2023 0527   CL 108 11/13/2023 0527   CO2 25 11/13/2023 0527   GLUCOSE 107 (H) 11/13/2023 0527   BUN 26 (H) 11/13/2023 0527   BUN 19 06/19/2020 0000   CREATININE 1.60 (H) 11/13/2023 0527   CREATININE 1.59 (H) 03/03/2014 0745   CALCIUM 8.5 (L) 11/13/2023 0527   PROT 7.5 11/10/2023 1330   ALBUMIN 3.6 11/10/2023 1330   AST 23 11/10/2023 1330   ALT 25 11/10/2023 1330   ALKPHOS 107 11/10/2023 1330   BILITOT 0.5 11/10/2023 1330   GFRNONAA 47 (L) 11/13/2023 0527   GFRAA 35 (L) 05/29/2020 0922   Lipase     Component Value Date/Time   LIPASE 21 11/10/2023 1330       Studies/Results: DG Abd Portable 1V-Small Bowel  Obstruction Protocol-24 hr delay Result Date: 11/12/2023 CLINICAL DATA:  Small-bowel obstruction, 24 hour delay EXAM: PORTABLE ABDOMEN - 1 VIEW COMPARISON:  Abdominal radiograph dated 11/11/2023 FINDINGS: Interval removal of enteric tube.  Gastric lap band in-situ. Contrast material has reached the rectum. A few loops of mildly dilated small bowel remain within the left upper quadrant. Partially imaged bilateral proximal femoral fixation hardware appears intact. Severe degenerative changes of the right hip. IMPRESSION: Contrast material has reached the rectum. A few loops of mildly dilated small bowel remain within the left upper quadrant. Electronically Signed   By: Agustin Cree M.D.   On: 11/12/2023 10:44   DG Abd Portable 1V-Small Bowel Obstruction Protocol-initial, 8 hr delay Result Date: 11/11/2023 CLINICAL DATA:  Small bowel obstruction, 8 hour delay EXAM: PORTABLE ABDOMEN - 1 VIEW COMPARISON:  CT 11/10/2023. FINDINGS: Oral contrast material is noted throughout the colon. Continued mildly dilated central small bowel loops. IMPRESSION: Oral contrast material throughout the colon. Mildly dilated central small bowel loops. Electronically Signed   By: Charlett Nose M.D.   On: 11/11/2023 20:45    Anti-infectives: Anti-infectives (From admission, onward)    None        Assessment/Plan  SBO Chronically incarcerated umbilical hernia  -  hernia not felt to be obstructive - Tolerating CLD and having multiple BMs - advance to soft diet  - mobilize as tolerated - if tolerating soft diet and having bowel function then stable for DC later today from surgery standpoint - if hernia repair desired would recommend follow up with hernia specialist   FEN: soft diet, SLIV VTE: LMWH ID: no current abx  - per [facility] -  HTN HLD Chronic diastolic CHF T2DM OSA on CPAP Morbid Obesity - BMI 52 Hx of colon cancer s/p sigmoid colectomy   LOS: 3 days   I reviewed hospitalist notes, last 24 h vitals and pain  scores, last 48 h intake and output, last 24 h labs and trends, and last 24 h imaging results.  This care required moderate level of medical decision making.    Juliet Rude, [facility] Surgery 11/13/2023, 9:35 AM Please see Amion for pager number during day hours 7:00am-4:30pm

## 2023-11-13 NOTE — Discharge Summary (Signed)
 Physician Discharge Summary  [name] ZOX:[id number] DOB: 1956-02-04 DOA: 11/10/2023  PCP: Estevan Oaks, NP  Admit date: 11/10/2023 Discharge date: 11/13/2023  Admitted From: Home Disposition:  Home  Discharge Condition:Stable CODE STATUS:FULL, Diet recommendation: Heart Healthy   Brief/Interim Summary: Patient is a 67 year old male with history of multiple abdominal surgeries, umbilical hernia presented with abdominal pain, vomiting, no passing of gas or bowel movement. CT abdomen/pelvis showed SBO with abrupt caliber transition deep to the superior aspect of the midline incision. Also showed signs of bilateral lower lobe bronchiectasis, cirrhosis. General surgery consulted and following.  His abdominal pain has resolved.  He is having multiple bowel movements.  Diet slowly advanced to soft and he tolerated.  Medically stable for discharge to home today.  General surgery cleared for discharge.  Following problems were addressed during the hospitalization:  SBO: History of multiple abdominal surgeries.  Also has abdominal/epigastric hernia.  Presented with abdomen pain, vomiting, lack of bowel movement.  Started on IV fluids.  Abdominal x-ray SBO protocol showed contrast into the colon.  SBO resolved.  Having bowel movements.  Diet advanced to soft.   CKD stage IIIb-4: Baseline creatinine 2-2.5.  Currently kidney function better than baseline.  Takes Lasix/metolazone at home.  We reduced the dose of Lasix to 60 mg twice daily.  I have recommended him to follow-up with his nephrologist as an outpatient   Diabetes type 2:   Takes Jardiance, insulin.  Patient instructed to monitor blood sugars at home.  Blood sugar stable without any long-acting insulin here   Bronchiectasis: Incidentally seen on CT scan.  Currently on room air.  Chest x-ray did not show any acute findings.  Denies shortness of breath or cough   History of gout: Takes allopurinol, colchicine at home   History of  hyperlipidemia: Lipitor   History of hypertension: On bisoprolol, losartan on hold.  Losartan will be stopped because blood pressure stable  Morbid obesity/debility: BMI 52.  Ambulates with the help of walker/wheelchair.  Lives alone  Discharge Diagnoses:  Principal Problem:   SBO (small bowel obstruction) (HCC) Active Problems:   CKD (chronic kidney disease)   Bronchiectasis (HCC)    Discharge Instructions  Discharge Instructions     Diet - low sodium heart healthy   Complete by: As directed    Discharge instructions   Complete by: As directed    1)Please take your medications as instructed 2)Follow up with your PCP and nephrologist as an outpatient 3)You were on high-dose insulin at home.  Please monitor blood sugars at home   Increase activity slowly   Complete by: As directed       Allergies as of 11/13/2023       Reactions   Shellfish Allergy Anaphylaxis   Lisinopril Cough   Relistor [methylnaltrexone Bromide] Other (See Comments)   confusion   Lovastatin Itching        Medication List     STOP taking these medications    losartan 25 MG tablet Commonly known as: COZAAR       TAKE these medications    Accu-Chek Aviva Connect w/Device Kit 1 Act by Does not apply route 3 (three) times daily as needed.   Accu-Chek Aviva Plus test strip Generic drug: glucose blood Use to test blood sugar twice daily. DX E11.8   Accu-Chek Softclix Lancets lancets CHECK BLOOD SUGAR 2 TIMES  DAILY   allopurinol 100 MG tablet Commonly known as: ZYLOPRIM Take 1 tablet (100 mg total) by mouth  daily.   aspirin EC 81 MG tablet Take 1 tablet (81 mg total) by mouth daily.   atorvastatin 80 MG tablet Commonly known as: LIPITOR Take 1 tablet (80 mg total) by mouth daily.   bisoprolol 10 MG tablet Commonly known as: ZEBETA Take 1 tablet (10 mg total) by mouth every evening.   blood glucose meter kit and supplies Dispense based on patient and insurance preference.  Use up to four times daily as directed. (FOR ICD-10 E10.9, E11.9).   blood glucose meter kit and supplies Kit Inject 1 each into the skin 4 (four) times daily. Use to check blood sugar three times daily. Dispense Freestyle Libre according to insurance preference. DX: E11.8   colchicine 0.6 MG tablet Take 0.6 mg by mouth daily.   DULCOLAX PO Take 1 tablet by mouth daily.   empagliflozin 10 MG Tabs tablet Commonly known as: Jardiance Take 1 tablet (10 mg total) by mouth daily before breakfast.   ferrous sulfate 325 (65 FE) MG tablet Take 1 tablet (325 mg total) by mouth 2 (two) times daily with a meal.   FreeStyle Libre 2 Sensor Misc 1 Act by Does not apply route daily.   furosemide 20 MG tablet Commonly known as: LASIX Take 3 tablets (60 mg total) by mouth 2 (two) times daily. What changed: how much to take   gabapentin 300 MG capsule Commonly known as: NEURONTIN Take 300 mg by mouth daily as needed (pain).   HYDROcodone-acetaminophen 10-325 MG tablet Commonly known as: NORCO Take 1 tablet by mouth every 6 (six) hours as needed for severe pain (pain score 7-10).   Lantus SoloStar 100 UNIT/ML Solostar Pen Generic drug: insulin glargine Inject 65 Units into the skin daily.   metolazone 2.5 MG tablet Commonly known as: ZAROXOLYN TAKE 1 TABLET (2.5 MG TOTAL) BY MOUTH ONCE A WEEK. EVERY TUESDAY   MULTIVITAMIN ADULT PO Take 1 tablet by mouth daily.   potassium chloride SA 20 MEQ tablet Commonly known as: KLOR-CON M TAKE 1 TABLET BY MOUTH ONCE A WEEK. EVERY TUESDAY   ProAir RespiClick 108 (90 Base) MCG/ACT Aepb Generic drug: Albuterol Sulfate Inhale 1 puff into the lungs 4 (four) times daily as needed. What changed: reasons to take this   Restasis 0.05 % ophthalmic emulsion Generic drug: cycloSPORINE PLACE 1 DROP INTO BOTH EYES TWICE A DAY   UltiCare Short Pen Needles 31G X 8 MM Misc Generic drug: Insulin Pen Needle USE TWO PEN NEEDLES DAILY WITH INSULIN PENS         Allergies  Allergen Reactions   Shellfish Allergy Anaphylaxis   Lisinopril Cough   Relistor [Methylnaltrexone Bromide] Other (See Comments)    confusion   Lovastatin Itching    Consultations: General surgery   Procedures/Studies: DG Abd Portable 1V-Small Bowel Obstruction Protocol-24 hr delay Result Date: 11/12/2023 CLINICAL DATA:  Small-bowel obstruction, 24 hour delay EXAM: PORTABLE ABDOMEN - 1 VIEW COMPARISON:  Abdominal radiograph dated 11/11/2023 FINDINGS: Interval removal of enteric tube.  Gastric lap band in-situ. Contrast material has reached the rectum. A few loops of mildly dilated small bowel remain within the left upper quadrant. Partially imaged bilateral proximal femoral fixation hardware appears intact. Severe degenerative changes of the right hip. IMPRESSION: Contrast material has reached the rectum. A few loops of mildly dilated small bowel remain within the left upper quadrant. Electronically Signed   By: Agustin Cree M.D.   On: 11/12/2023 10:44   DG Abd Portable 1V-Small Bowel Obstruction Protocol-initial, 8 hr delay Result  Date: 11/11/2023 CLINICAL DATA:  Small bowel obstruction, 8 hour delay EXAM: PORTABLE ABDOMEN - 1 VIEW COMPARISON:  CT 11/10/2023. FINDINGS: Oral contrast material is noted throughout the colon. Continued mildly dilated central small bowel loops. IMPRESSION: Oral contrast material throughout the colon. Mildly dilated central small bowel loops. Electronically Signed   By: Charlett Nose M.D.   On: 11/11/2023 20:45   DG Abd 1 View Result Date: 11/10/2023 CLINICAL DATA:  NG tube EXAM: ABDOMEN - 1 VIEW COMPARISON:  Abdominal series 11/05/2019. CT abdomen and pelvis 11/10/2023. FINDINGS: Enteric tube tip is in the proximal body of the stomach. There are few dilated small bowel loops in the upper abdomen compatible with small bowel obstruction as seen on recent CT. Air seen throughout nondilated colon to the level the rectum. Bilateral hip screws are  present. There severe degenerative changes of the hip. IMPRESSION: 1. Enteric tube tip is in the proximal body of the stomach. Recommended advancing 5 cm. 2. Dilated small bowel loops in the upper abdomen compatible with small bowel obstruction as seen on recent CT. Electronically Signed   By: Darliss Cheney M.D.   On: 11/10/2023 21:09   DG CHEST PORT 1 VIEW Result Date: 11/10/2023 CLINICAL DATA:  Preop EXAM: PORTABLE CHEST 1 VIEW COMPARISON:  11/03/2022 FINDINGS: Heart and mediastinal contours are within normal limits. No focal opacities or effusions. No acute bony abnormality. NG tube enters the stomach. IMPRESSION: No active disease. Electronically Signed   By: Charlett Nose M.D.   On: 11/10/2023 21:02   CT ABDOMEN PELVIS W CONTRAST Result Date: 11/10/2023 CLINICAL DATA:  Acute onset abdominal pain EXAM: CT ABDOMEN AND PELVIS WITH CONTRAST TECHNIQUE: Multidetector CT imaging of the abdomen and pelvis was performed using the standard protocol following bolus administration of intravenous contrast. RADIATION DOSE REDUCTION: This exam was performed according to the departmental dose-optimization program which includes automated exposure control, adjustment of the mA and/or kV according to patient size and/or use of iterative reconstruction technique. CONTRAST:  OMNIPAQUE IOHEXOL 300 MG/ML  SOLN COMPARISON:  CT abdomen and pelvis dated 12/01/2022 FINDINGS: Lower chest: Bilateral lower lobe bronchiectasis with lateral left lower lobe basilar bronchial mucous plugging. No pleural effusion or pneumothorax demonstrated. Partially imaged heart size is normal. Coronary artery calcifications. Hepatobiliary: Mildly nodular hepatic contour. No intra or extrahepatic biliary ductal dilation. Cholelithiasis. Pancreas: No focal lesions or main ductal dilation. Spleen: Normal in size without focal abnormality. Adrenals/Urinary Tract: No adrenal nodules. No suspicious renal mass, calculi or hydronephrosis. No focal  bladder wall thickening. Stomach/Bowel: Normal appearance of the stomach. Gastric band in-situ. Diffuse small bowel dilation with multiple points of tethering along the anterior abdominal surgical site. Abrupt caliber transition deep to the superior aspect of the midline incision (2:54, 8:50). Bowel distal to this point is largely decompressed. Colonic diverticulosis without acute diverticulitis. Normal appendix. Vascular/Lymphatic: Aortic atherosclerosis. No enlarged abdominal or pelvic lymph nodes. Reproductive: Prostate is unremarkable. Other: No free fluid, fluid collection, or free air. Musculoskeletal: No acute or abnormal lytic or blastic osseous lesions. Postsurgical changes of the anterior abdominal wall. Ventral midline abdominal hernia containing fat and a loop of non obstructed transverse colon. Postsurgical changes of bilateral proximal femurs. Severe degenerative changes of the right hip. Multilevel degenerative changes of the partially imaged thoracic and lumbar spine. IMPRESSION: 1. Small-bowel obstruction with abrupt caliber transition deep to the superior aspect of the midline incision. 2. Bilateral lower lobe bronchiectasis with lateral left lower lobe basilar bronchial mucous plugging. 3. Mildly nodular  hepatic contour, which can be seen in the setting of cirrhosis. 4. Cholelithiasis. 5. Aortic Atherosclerosis (ICD10-I70.0). Coronary artery calcifications. Assessment for potential risk factor modification, dietary therapy or pharmacologic therapy may be warranted, if clinically indicated. Electronically Signed   By: Agustin Cree M.D.   On: 11/10/2023 16:53   ECHOCARDIOGRAM COMPLETE Result Date: 10/27/2023    ECHOCARDIOGRAM REPORT   Patient Name:   [name] Date of Exam: 10/27/2023 Medical Rec #:  [id number]     Height:       73.0 in Accession #:    [id number]    Weight:       400.0 lb Date of Birth:  [birth year]     BSA:          2.889 m Patient Age:    67 years      BP:           90/56 mmHg  Patient Gender: M             HR:           63 bpm. Exam Location:  Outpatient Procedure: 2D Echo, Cardiac Doppler and Color Doppler Indications:    CHF  History:        Patient has prior history of Echocardiogram examinations. CHF.  Sonographer:    Neysa Bonito Roar Referring Phys: [phone] JESSICA M [place] IMPRESSIONS  1. Left ventricular ejection fraction, by estimation, is 50 to 55%. The left ventricle has low normal function. The left ventricle has no regional wall motion abnormalities. There is mild left ventricular hypertrophy. Left ventricular diastolic parameters were normal.  2. Right ventricular systolic function is normal. The right ventricular size is normal.  3. Left atrial size was moderately dilated.  4. The mitral valve is abnormal. Trivial mitral valve regurgitation. No evidence of mitral stenosis.  5. The aortic valve is tricuspid. There is moderate calcification of the aortic valve. There is moderate thickening of the aortic valve. Aortic valve regurgitation is not visualized. Aortic valve sclerosis is present, with no evidence of aortic valve stenosis.  6. The inferior vena cava is dilated in size with >50% respiratory variability, suggesting right atrial pressure of 8 mmHg. FINDINGS  Left Ventricle: Septal hypokinesis. Left ventricular ejection fraction, by estimation, is 50 to 55%. The left ventricle has low normal function. The left ventricle has no regional wall motion abnormalities. The left ventricular internal cavity size was normal in size. There is mild left ventricular hypertrophy. Left ventricular diastolic parameters were normal. Right Ventricle: The right ventricular size is normal. No increase in right ventricular wall thickness. Right ventricular systolic function is normal. Left Atrium: Left atrial size was moderately dilated. Right Atrium: Right atrial size was normal in size. Pericardium: There is no evidence of pericardial effusion. Mitral Valve: The mitral valve is abnormal. There  is mild thickening of the mitral valve leaflet(s). Trivial mitral valve regurgitation. No evidence of mitral valve stenosis. MV peak gradient, 5.0 mmHg. The mean mitral valve gradient is 2.0 mmHg. Tricuspid Valve: The tricuspid valve is normal in structure. Tricuspid valve regurgitation is mild . No evidence of tricuspid stenosis. Aortic Valve: The aortic valve is tricuspid. There is moderate calcification of the aortic valve. There is moderate thickening of the aortic valve. Aortic valve regurgitation is not visualized. Aortic valve sclerosis is present, with no evidence of aortic valve stenosis. Aortic valve mean gradient measures 6.0 mmHg. Aortic valve peak gradient measures 10.0 mmHg. Aortic valve area, by VTI measures 1.93 cm. Pulmonic Valve: The  pulmonic valve was normal in structure. Pulmonic valve regurgitation is mild. No evidence of pulmonic stenosis. Aorta: The aortic root is normal in size and structure. Venous: The inferior vena cava is dilated in size with greater than 50% respiratory variability, suggesting right atrial pressure of 8 mmHg. IAS/Shunts: No atrial level shunt detected by color flow Doppler.  LEFT VENTRICLE PLAX 2D LVIDd:         6.00 cm   Diastology LVIDs:         4.70 cm   LV e' medial:    6.09 cm/s LV PW:         1.10 cm   LV E/e' medial:  13.8 LV IVS:        1.20 cm   LV e' lateral:   7.40 cm/s LVOT diam:     2.20 cm   LV E/e' lateral: 11.3 LV SV:         80 LV SV Index:   28 LVOT Area:     3.80 cm  RIGHT VENTRICLE RV Basal diam:  3.70 cm RV Mid diam:    3.10 cm RV S prime:     12.40 cm/s TAPSE (M-mode): 3.0 cm LEFT ATRIUM             Index        RIGHT ATRIUM           Index LA diam:        4.90 cm 1.70 cm/m   RA Area:     18.80 cm LA Vol (A2C):   73.8 ml 25.55 ml/m  RA Volume:   55.30 ml  19.14 ml/m LA Vol (A4C):   93.9 ml 32.51 ml/m LA Biplane Vol: 84.9 ml 29.39 ml/m  AORTIC VALVE                     PULMONIC VALVE AV Area (Vmax):    2.05 cm      PV Vmax:          0.88 m/s  AV Area (Vmean):   2.08 cm      PV Peak grad:     3.1 mmHg AV Area (VTI):     1.93 cm      PR End Diast Vel: 9.49 msec AV Vmax:           158.00 cm/s   RVOT Peak grad:   2 mmHg AV Vmean:          117.000 cm/s AV VTI:            0.415 m AV Peak Grad:      10.0 mmHg AV Mean Grad:      6.0 mmHg LVOT Vmax:         85.40 cm/s LVOT Vmean:        64.000 cm/s LVOT VTI:          0.211 m LVOT/AV VTI ratio: 0.51  AORTA Ao Sinus diam: 2.90 cm Ao STJ diam:   2.7 cm Ao Asc diam:   3.30 cm MITRAL VALVE                TRICUSPID VALVE MV Area (PHT): 3.34 cm     TR Peak grad:   27.5 mmHg MV Area VTI:   2.73 cm     TR Vmax:        262.00 cm/s MV Peak grad:  5.0 mmHg MV Mean grad:  2.0 mmHg     SHUNTS MV Vmax:  1.12 m/s     Systemic VTI:  0.21 m MV Vmean:      70.3 cm/s    Systemic Diam: 2.20 cm MV Decel Time: 227 msec MV E velocity: 83.80 cm/s MV A velocity: 111.00 cm/s MV E/A ratio:  0.75 Charlton Haws MD Electronically signed by Charlton Haws MD Signature Date/Time: 10/27/2023/11:03:27 AM    Final       Subjective: Patient seen and examined at bedside today.  Hemodynamically stable comfortable.  No abdomen pain nausea vomiting.  Having bowel movements.  Medically stable for discharge  Discharge Exam: Vitals:   11/12/23 2119 11/13/23 0511  BP: (!) 136/57 129/71  Pulse: 67 69  Resp: 17 18  Temp: 98.5 F (36.9 C) 98.6 F (37 C)  SpO2: 100% 93%   Vitals:   11/12/23 0508 11/12/23 1316 11/12/23 2119 11/13/23 0511  BP: 135/80 122/72 (!) 136/57 129/71  Pulse: 83 69 67 69  Resp: 17 16 17 18   Temp: 98.2 F (36.8 C) 98.8 F (37.1 C) 98.5 F (36.9 C) 98.6 F (37 C)  TempSrc: Oral Oral Oral Oral  SpO2: 99% 94% 100% 93%  Weight:      Height:        General: Pt is alert, awake, not in acute distress, morbidly obese Cardiovascular: RRR, S1/S2 +, no rubs, no gallops Respiratory: CTA bilaterally, no wheezing, no rhonchi Abdominal: Soft, NT, ND, bowel sounds + Extremities: no edema, no cyanosis    The  results of significant diagnostics from this hospitalization (including imaging, microbiology, ancillary and laboratory) are listed below for reference.     Microbiology: No results found for this or any previous visit (from the past 240 hours).   Labs: BNP (last 3 results) Recent Labs    07/11/23 0938 10/13/23 0947  BNP 93.3 32.9   Basic Metabolic Panel: Recent Labs  Lab 11/10/23 1330 11/11/23 0809 11/12/23 0651 11/13/23 0527  NA 141 143 147* 143  K 3.5 4.0 4.7 3.9  CL 102 104 109 108  CO2 28 29 29 25   GLUCOSE 112* 123* 180* 107*  BUN 61* 54* 38* 26*  CREATININE 2.24* 2.12* 1.86* 1.60*  CALCIUM 9.2 8.7* 9.1 8.5*   Liver Function Tests: Recent Labs  Lab 11/10/23 1330  AST 23  ALT 25  ALKPHOS 107  BILITOT 0.5  PROT 7.5  ALBUMIN 3.6   Recent Labs  Lab 11/10/23 1330  LIPASE 21   No results for input(s): "AMMONIA" in the last 168 hours. CBC: Recent Labs  Lab 11/10/23 1330 11/11/23 0809  WBC 5.9 5.2  HGB 11.5* 10.6*  HCT 37.8* 35.8*  MCV 92.6 94.2  PLT 171 158   Cardiac Enzymes: No results for input(s): "CKTOTAL", "CKMB", "CKMBINDEX", "TROPONINI" in the last 168 hours. BNP: Invalid input(s): "POCBNP" CBG: Recent Labs  Lab 11/12/23 1706 11/12/23 2121 11/13/23 0017 11/13/23 0511 11/13/23 0730  GLUCAP 206* 133* 99 107* 123*   D-Dimer No results for input(s): "DDIMER" in the last 72 hours. Hgb A1c Recent Labs    11/11/23 0809  HGBA1C 9.3*   Lipid Profile No results for input(s): "CHOL", "HDL", "LDLCALC", "TRIG", "CHOLHDL", "LDLDIRECT" in the last 72 hours. Thyroid function studies No results for input(s): "TSH", "T4TOTAL", "T3FREE", "THYROIDAB" in the last 72 hours.  Invalid input(s): "FREET3" Anemia work up No results for input(s): "VITAMINB12", "FOLATE", "FERRITIN", "TIBC", "IRON", "RETICCTPCT" in the last 72 hours. Urinalysis    Component Value Date/Time   COLORURINE STRAW (A) 11/10/2023 2200   APPEARANCEUR CLEAR  11/10/2023 2200    LABSPEC 1.014 11/10/2023 2200   PHURINE 5.0 11/10/2023 2200   GLUCOSEU 150 (A) 11/10/2023 2200   GLUCOSEU >=1000 (A) 12/08/2020 0918   HGBUR NEGATIVE 11/10/2023 2200   BILIRUBINUR NEGATIVE 11/10/2023 2200   KETONESUR NEGATIVE 11/10/2023 2200   PROTEINUR NEGATIVE 11/10/2023 2200   UROBILINOGEN 0.2 12/08/2020 0918   NITRITE NEGATIVE 11/10/2023 2200   LEUKOCYTESUR NEGATIVE 11/10/2023 2200   Sepsis Labs Recent Labs  Lab 11/10/23 1330 11/11/23 0809  WBC 5.9 5.2   Microbiology No results found for this or any previous visit (from the past 240 hours).  Please note: You were cared for by a hospitalist during your hospital stay. Once you are discharged, your primary care physician will handle any further medical issues. Please note that NO REFILLS for any discharge medications will be authorized once you are discharged, as it is imperative that you return to your primary care physician (or establish a relationship with a primary care physician if you do not have one) for your post hospital discharge needs so that they can reassess your need for medications and monitor your lab values.    Time coordinating discharge: 40 minutes  SIGNED:   [name], MD  [facility] 11/13/2023, 10:36 AM Pager [phone]  If 7PM-7AM, please contact night-coverage [email or web address] [facility] Physician Discharge Summary  [name] [id number]:[id number] DOB: 1956-08-23 DOA: 11/10/2023  PCP: Estevan Oaks, NP  Admit date: 11/10/2023 Discharge date: 11/13/2023  Admitted From: Home Disposition:  Home  Discharge Condition:Stable CODE STATUS:FULL, DNR, Comfort Care Diet recommendation: Heart Healthy / Carb Modified / Regular / Dysphagia   Brief/Interim Summary:   Following problems were addressed during the hospitalization:   Discharge Diagnoses:  Principal Problem:   SBO (small bowel obstruction) (HCC) Active Problems:   CKD (chronic kidney disease)   Bronchiectasis  (HCC)    Discharge Instructions  Discharge Instructions     Diet - low sodium heart healthy   Complete by: As directed    Discharge instructions   Complete by: As directed    1)Please take your medications as instructed 2)Follow up with your PCP and nephrologist as an outpatient 3)You were on high-dose insulin at home.  Please monitor blood sugars at home   Increase activity slowly   Complete by: As directed       Allergies as of 11/13/2023       Reactions   Shellfish Allergy Anaphylaxis   Lisinopril Cough   Relistor [methylnaltrexone Bromide] Other (See Comments)   confusion   Lovastatin Itching        Medication List     STOP taking these medications    losartan 25 MG tablet Commonly known as: COZAAR       TAKE these medications    Accu-Chek Aviva Connect w/Device Kit 1 Act by Does not apply route 3 (three) times daily as needed.   Accu-Chek Aviva Plus test strip Generic drug: glucose blood Use to test blood sugar twice daily. DX E11.8   Accu-Chek Softclix Lancets lancets CHECK BLOOD SUGAR 2 TIMES  DAILY   allopurinol 100 MG tablet Commonly known as: ZYLOPRIM Take 1 tablet (100 mg total) by mouth daily.   aspirin EC 81 MG tablet Take 1 tablet (81 mg total) by mouth daily.   atorvastatin 80 MG tablet Commonly known as: LIPITOR Take 1 tablet (80 mg total) by mouth daily.   bisoprolol 10 MG tablet Commonly known as: ZEBETA Take 1 tablet (10 mg total) by mouth  every evening.   blood glucose meter kit and supplies Dispense based on patient and insurance preference. Use up to four times daily as directed. (FOR ICD-10 E10.9, E11.9).   blood glucose meter kit and supplies Kit Inject 1 each into the skin 4 (four) times daily. Use to check blood sugar three times daily. Dispense Freestyle Libre according to insurance preference. DX: E11.8   colchicine 0.6 MG tablet Take 0.6 mg by mouth daily.   DULCOLAX PO Take 1 tablet by mouth daily.    empagliflozin 10 MG Tabs tablet Commonly known as: Jardiance Take 1 tablet (10 mg total) by mouth daily before breakfast.   ferrous sulfate 325 (65 FE) MG tablet Take 1 tablet (325 mg total) by mouth 2 (two) times daily with a meal.   FreeStyle Libre 2 Sensor Misc 1 Act by Does not apply route daily.   furosemide 20 MG tablet Commonly known as: LASIX Take 3 tablets (60 mg total) by mouth 2 (two) times daily. What changed: how much to take   gabapentin 300 MG capsule Commonly known as: NEURONTIN Take 300 mg by mouth daily as needed (pain).   HYDROcodone-acetaminophen 10-325 MG tablet Commonly known as: NORCO Take 1 tablet by mouth every 6 (six) hours as needed for severe pain (pain score 7-10).   Lantus SoloStar 100 UNIT/ML Solostar Pen Generic drug: insulin glargine Inject 65 Units into the skin daily.   metolazone 2.5 MG tablet Commonly known as: ZAROXOLYN TAKE 1 TABLET (2.5 MG TOTAL) BY MOUTH ONCE A WEEK. EVERY TUESDAY   MULTIVITAMIN ADULT PO Take 1 tablet by mouth daily.   potassium chloride SA 20 MEQ tablet Commonly known as: KLOR-CON M TAKE 1 TABLET BY MOUTH ONCE A WEEK. EVERY TUESDAY   ProAir RespiClick 108 (90 Base) MCG/ACT Aepb Generic drug: Albuterol Sulfate Inhale 1 puff into the lungs 4 (four) times daily as needed. What changed: reasons to take this   Restasis 0.05 % ophthalmic emulsion Generic drug: cycloSPORINE PLACE 1 DROP INTO BOTH EYES TWICE A DAY   UltiCare Short Pen Needles 31G X 8 MM Misc Generic drug: Insulin Pen Needle USE TWO PEN NEEDLES DAILY WITH INSULIN PENS        Allergies  Allergen Reactions   Shellfish Allergy Anaphylaxis   Lisinopril Cough   Relistor [Methylnaltrexone Bromide] Other (See Comments)    confusion   Lovastatin Itching    Consultations:    Procedures/Studies: DG Abd Portable 1V-Small Bowel Obstruction Protocol-24 hr delay Result Date: 11/12/2023 CLINICAL DATA:  Small-bowel obstruction, 24 hour delay  EXAM: PORTABLE ABDOMEN - 1 VIEW COMPARISON:  Abdominal radiograph dated 11/11/2023 FINDINGS: Interval removal of enteric tube.  Gastric lap band in-situ. Contrast material has reached the rectum. A few loops of mildly dilated small bowel remain within the left upper quadrant. Partially imaged bilateral proximal femoral fixation hardware appears intact. Severe degenerative changes of the right hip. IMPRESSION: Contrast material has reached the rectum. A few loops of mildly dilated small bowel remain within the left upper quadrant. Electronically Signed   By: Agustin Cree M.D.   On: 11/12/2023 10:44   DG Abd Portable 1V-Small Bowel Obstruction Protocol-initial, 8 hr delay Result Date: 11/11/2023 CLINICAL DATA:  Small bowel obstruction, 8 hour delay EXAM: PORTABLE ABDOMEN - 1 VIEW COMPARISON:  CT 11/10/2023. FINDINGS: Oral contrast material is noted throughout the colon. Continued mildly dilated central small bowel loops. IMPRESSION: Oral contrast material throughout the colon. Mildly dilated central small bowel loops. Electronically Signed   By:  Charlett Nose M.D.   On: 11/11/2023 20:45   DG Abd 1 View Result Date: 11/10/2023 CLINICAL DATA:  NG tube EXAM: ABDOMEN - 1 VIEW COMPARISON:  Abdominal series 11/05/2019. CT abdomen and pelvis 11/10/2023. FINDINGS: Enteric tube tip is in the proximal body of the stomach. There are few dilated small bowel loops in the upper abdomen compatible with small bowel obstruction as seen on recent CT. Air seen throughout nondilated colon to the level the rectum. Bilateral hip screws are present. There severe degenerative changes of the hip. IMPRESSION: 1. Enteric tube tip is in the proximal body of the stomach. Recommended advancing 5 cm. 2. Dilated small bowel loops in the upper abdomen compatible with small bowel obstruction as seen on recent CT. Electronically Signed   By: Darliss Cheney M.D.   On: 11/10/2023 21:09   DG CHEST PORT 1 VIEW Result Date: 11/10/2023 CLINICAL DATA:   Preop EXAM: PORTABLE CHEST 1 VIEW COMPARISON:  11/03/2022 FINDINGS: Heart and mediastinal contours are within normal limits. No focal opacities or effusions. No acute bony abnormality. NG tube enters the stomach. IMPRESSION: No active disease. Electronically Signed   By: Charlett Nose M.D.   On: 11/10/2023 21:02   CT ABDOMEN PELVIS W CONTRAST Result Date: 11/10/2023 CLINICAL DATA:  Acute onset abdominal pain EXAM: CT ABDOMEN AND PELVIS WITH CONTRAST TECHNIQUE: Multidetector CT imaging of the abdomen and pelvis was performed using the standard protocol following bolus administration of intravenous contrast. RADIATION DOSE REDUCTION: This exam was performed according to the departmental dose-optimization program which includes automated exposure control, adjustment of the mA and/or kV according to patient size and/or use of iterative reconstruction technique. CONTRAST:  OMNIPAQUE IOHEXOL 300 MG/ML  SOLN COMPARISON:  CT abdomen and pelvis dated 12/01/2022 FINDINGS: Lower chest: Bilateral lower lobe bronchiectasis with lateral left lower lobe basilar bronchial mucous plugging. No pleural effusion or pneumothorax demonstrated. Partially imaged heart size is normal. Coronary artery calcifications. Hepatobiliary: Mildly nodular hepatic contour. No intra or extrahepatic biliary ductal dilation. Cholelithiasis. Pancreas: No focal lesions or main ductal dilation. Spleen: Normal in size without focal abnormality. Adrenals/Urinary Tract: No adrenal nodules. No suspicious renal mass, calculi or hydronephrosis. No focal bladder wall thickening. Stomach/Bowel: Normal appearance of the stomach. Gastric band in-situ. Diffuse small bowel dilation with multiple points of tethering along the anterior abdominal surgical site. Abrupt caliber transition deep to the superior aspect of the midline incision (2:54, 8:50). Bowel distal to this point is largely decompressed. Colonic diverticulosis without acute diverticulitis. Normal  appendix. Vascular/Lymphatic: Aortic atherosclerosis. No enlarged abdominal or pelvic lymph nodes. Reproductive: Prostate is unremarkable. Other: No free fluid, fluid collection, or free air. Musculoskeletal: No acute or abnormal lytic or blastic osseous lesions. Postsurgical changes of the anterior abdominal wall. Ventral midline abdominal hernia containing fat and a loop of non obstructed transverse colon. Postsurgical changes of bilateral proximal femurs. Severe degenerative changes of the right hip. Multilevel degenerative changes of the partially imaged thoracic and lumbar spine. IMPRESSION: 1. Small-bowel obstruction with abrupt caliber transition deep to the superior aspect of the midline incision. 2. Bilateral lower lobe bronchiectasis with lateral left lower lobe basilar bronchial mucous plugging. 3. Mildly nodular hepatic contour, which can be seen in the setting of cirrhosis. 4. Cholelithiasis. 5. Aortic Atherosclerosis (ICD10-I70.0). Coronary artery calcifications. Assessment for potential risk factor modification, dietary therapy or pharmacologic therapy may be warranted, if clinically indicated. Electronically Signed   By: Agustin Cree M.D.   On: 11/10/2023 16:53   ECHOCARDIOGRAM COMPLETE  Result Date: 10/27/2023    ECHOCARDIOGRAM REPORT   Patient Name:   [name] Date of Exam: 10/27/2023 Medical Rec #:  [id number]     Height:       73.0 in Accession #:    [id number]    Weight:       400.0 lb Date of Birth:  Sep 20, 1955     BSA:          2.889 m Patient Age:    67 years      BP:           90/56 mmHg Patient Gender: M             HR:           63 bpm. Exam Location:  Outpatient Procedure: 2D Echo, Cardiac Doppler and Color Doppler Indications:    CHF  History:        Patient has prior history of Echocardiogram examinations. CHF.  Sonographer:    Neysa Bonito Roar Referring Phys: [phone] JESSICA M [place] IMPRESSIONS  1. Left ventricular ejection fraction, by estimation, is 50 to 55%. The left ventricle has low  normal function. The left ventricle has no regional wall motion abnormalities. There is mild left ventricular hypertrophy. Left ventricular diastolic parameters were normal.  2. Right ventricular systolic function is normal. The right ventricular size is normal.  3. Left atrial size was moderately dilated.  4. The mitral valve is abnormal. Trivial mitral valve regurgitation. No evidence of mitral stenosis.  5. The aortic valve is tricuspid. There is moderate calcification of the aortic valve. There is moderate thickening of the aortic valve. Aortic valve regurgitation is not visualized. Aortic valve sclerosis is present, with no evidence of aortic valve stenosis.  6. The inferior vena cava is dilated in size with >50% respiratory variability, suggesting right atrial pressure of 8 mmHg. FINDINGS  Left Ventricle: Septal hypokinesis. Left ventricular ejection fraction, by estimation, is 50 to 55%. The left ventricle has low normal function. The left ventricle has no regional wall motion abnormalities. The left ventricular internal cavity size was normal in size. There is mild left ventricular hypertrophy. Left ventricular diastolic parameters were normal. Right Ventricle: The right ventricular size is normal. No increase in right ventricular wall thickness. Right ventricular systolic function is normal. Left Atrium: Left atrial size was moderately dilated. Right Atrium: Right atrial size was normal in size. Pericardium: There is no evidence of pericardial effusion. Mitral Valve: The mitral valve is abnormal. There is mild thickening of the mitral valve leaflet(s). Trivial mitral valve regurgitation. No evidence of mitral valve stenosis. MV peak gradient, 5.0 mmHg. The mean mitral valve gradient is 2.0 mmHg. Tricuspid Valve: The tricuspid valve is normal in structure. Tricuspid valve regurgitation is mild . No evidence of tricuspid stenosis. Aortic Valve: The aortic valve is tricuspid. There is moderate calcification of  the aortic valve. There is moderate thickening of the aortic valve. Aortic valve regurgitation is not visualized. Aortic valve sclerosis is present, with no evidence of aortic valve stenosis. Aortic valve mean gradient measures 6.0 mmHg. Aortic valve peak gradient measures 10.0 mmHg. Aortic valve area, by VTI measures 1.93 cm. Pulmonic Valve: The pulmonic valve was normal in structure. Pulmonic valve regurgitation is mild. No evidence of pulmonic stenosis. Aorta: The aortic root is normal in size and structure. Venous: The inferior vena cava is dilated in size with greater than 50% respiratory variability, suggesting right atrial pressure of 8 mmHg. IAS/Shunts: No atrial level shunt detected by  color flow Doppler.  LEFT VENTRICLE PLAX 2D LVIDd:         6.00 cm   Diastology LVIDs:         4.70 cm   LV e' medial:    6.09 cm/s LV PW:         1.10 cm   LV E/e' medial:  13.8 LV IVS:        1.20 cm   LV e' lateral:   7.40 cm/s LVOT diam:     2.20 cm   LV E/e' lateral: 11.3 LV SV:         80 LV SV Index:   28 LVOT Area:     3.80 cm  RIGHT VENTRICLE RV Basal diam:  3.70 cm RV Mid diam:    3.10 cm RV S prime:     12.40 cm/s TAPSE (M-mode): 3.0 cm LEFT ATRIUM             Index        RIGHT ATRIUM           Index LA diam:        4.90 cm 1.70 cm/m   RA Area:     18.80 cm LA Vol (A2C):   73.8 ml 25.55 ml/m  RA Volume:   55.30 ml  19.14 ml/m LA Vol (A4C):   93.9 ml 32.51 ml/m LA Biplane Vol: 84.9 ml 29.39 ml/m  AORTIC VALVE                     PULMONIC VALVE AV Area (Vmax):    2.05 cm      PV Vmax:          0.88 m/s AV Area (Vmean):   2.08 cm      PV Peak grad:     3.1 mmHg AV Area (VTI):     1.93 cm      PR End Diast Vel: 9.49 msec AV Vmax:           158.00 cm/s   RVOT Peak grad:   2 mmHg AV Vmean:          117.000 cm/s AV VTI:            0.415 m AV Peak Grad:      10.0 mmHg AV Mean Grad:      6.0 mmHg LVOT Vmax:         85.40 cm/s LVOT Vmean:        64.000 cm/s LVOT VTI:          0.211 m LVOT/AV VTI ratio: 0.51   AORTA Ao Sinus diam: 2.90 cm Ao STJ diam:   2.7 cm Ao Asc diam:   3.30 cm MITRAL VALVE                TRICUSPID VALVE MV Area (PHT): 3.34 cm     TR Peak grad:   27.5 mmHg MV Area VTI:   2.73 cm     TR Vmax:        262.00 cm/s MV Peak grad:  5.0 mmHg MV Mean grad:  2.0 mmHg     SHUNTS MV Vmax:       1.12 m/s     Systemic VTI:  0.21 m MV Vmean:      70.3 cm/s    Systemic Diam: 2.20 cm MV Decel Time: 227 msec MV E velocity: 83.80 cm/s MV A velocity: 111.00 cm/s MV E/A ratio:  0.75 Charlton Haws  MD Electronically signed by Charlton Haws MD Signature Date/Time: 10/27/2023/11:03:27 AM    Final       Subjective:   Discharge Exam: Vitals:   11/12/23 2119 11/13/23 0511  BP: (!) 136/57 129/71  Pulse: 67 69  Resp: 17 18  Temp: 98.5 F (36.9 C) 98.6 F (37 C)  SpO2: 100% 93%   Vitals:   11/12/23 0508 11/12/23 1316 11/12/23 2119 11/13/23 0511  BP: 135/80 122/72 (!) 136/57 129/71  Pulse: 83 69 67 69  Resp: 17 16 17 18   Temp: 98.2 F (36.8 C) 98.8 F (37.1 C) 98.5 F (36.9 C) 98.6 F (37 C)  TempSrc: Oral Oral Oral Oral  SpO2: 99% 94% 100% 93%  Weight:      Height:        General: Pt is alert, awake, not in acute distress Cardiovascular: RRR, S1/S2 +, no rubs, no gallops Respiratory: CTA bilaterally, no wheezing, no rhonchi Abdominal: Soft, NT, ND, bowel sounds + Extremities: no edema, no cyanosis    The results of significant diagnostics from this hospitalization (including imaging, microbiology, ancillary and laboratory) are listed below for reference.     Microbiology: No results found for this or any previous visit (from the past 240 hours).   Labs: BNP (last 3 results) Recent Labs    07/11/23 0938 10/13/23 0947  BNP 93.3 32.9   Basic Metabolic Panel: Recent Labs  Lab 11/10/23 1330 11/11/23 0809 11/12/23 0651 11/13/23 0527  NA 141 143 147* 143  K 3.5 4.0 4.7 3.9  CL 102 104 109 108  CO2 28 29 29 25   GLUCOSE 112* 123* 180* 107*  BUN 61* 54* 38* 26*  CREATININE  2.24* 2.12* 1.86* 1.60*  CALCIUM 9.2 8.7* 9.1 8.5*   Liver Function Tests: Recent Labs  Lab 11/10/23 1330  AST 23  ALT 25  ALKPHOS 107  BILITOT 0.5  PROT 7.5  ALBUMIN 3.6   Recent Labs  Lab 11/10/23 1330  LIPASE 21   No results for input(s): "AMMONIA" in the last 168 hours. CBC: Recent Labs  Lab 11/10/23 1330 11/11/23 0809  WBC 5.9 5.2  HGB 11.5* 10.6*  HCT 37.8* 35.8*  MCV 92.6 94.2  PLT 171 158   Cardiac Enzymes: No results for input(s): "CKTOTAL", "CKMB", "CKMBINDEX", "TROPONINI" in the last 168 hours. BNP: Invalid input(s): "POCBNP" CBG: Recent Labs  Lab 11/12/23 1706 11/12/23 2121 11/13/23 0017 11/13/23 0511 11/13/23 0730  GLUCAP 206* 133* 99 107* 123*   D-Dimer No results for input(s): "DDIMER" in the last 72 hours. Hgb A1c Recent Labs    11/11/23 0809  HGBA1C 9.3*   Lipid Profile No results for input(s): "CHOL", "HDL", "LDLCALC", "TRIG", "CHOLHDL", "LDLDIRECT" in the last 72 hours. Thyroid function studies No results for input(s): "TSH", "T4TOTAL", "T3FREE", "THYROIDAB" in the last 72 hours.  Invalid input(s): "FREET3" Anemia work up No results for input(s): "VITAMINB12", "FOLATE", "FERRITIN", "TIBC", "IRON", "RETICCTPCT" in the last 72 hours. Urinalysis    Component Value Date/Time   COLORURINE STRAW (A) 11/10/2023 2200   APPEARANCEUR CLEAR 11/10/2023 2200   LABSPEC 1.014 11/10/2023 2200   PHURINE 5.0 11/10/2023 2200   GLUCOSEU 150 (A) 11/10/2023 2200   GLUCOSEU >=1000 (A) 12/08/2020 0918   HGBUR NEGATIVE 11/10/2023 2200   BILIRUBINUR NEGATIVE 11/10/2023 2200   KETONESUR NEGATIVE 11/10/2023 2200   PROTEINUR NEGATIVE 11/10/2023 2200   UROBILINOGEN 0.2 12/08/2020 0918   NITRITE NEGATIVE 11/10/2023 2200   LEUKOCYTESUR NEGATIVE 11/10/2023 2200   Sepsis Labs Recent Labs  Lab 11/10/23 1330 11/11/23 0809  WBC 5.9 5.2   Microbiology No results found for this or any previous visit (from the past 240 hours).  Please note: You were  cared for by a hospitalist during your hospital stay. Once you are discharged, your primary care physician will handle any further medical issues. Please note that NO REFILLS for any discharge medications will be authorized once you are discharged, as it is imperative that you return to your primary care physician (or establish a relationship with a primary care physician if you do not have one) for your post hospital discharge needs so that they can reassess your need for medications and monitor your lab values.    Time coordinating discharge: 40 minutes  SIGNED:   [name], MD  [facility] 11/13/2023, 10:37 AM Pager [phone]  If 7PM-7AM, please contact night-coverage [email or web address] Password [email or web address]

## 2023-11-13 NOTE — Progress Notes (Signed)
   11/13/23 0209  BiPAP/CPAP/SIPAP  Reason BIPAP/CPAP not in use Non-compliant (patient does not use)

## 2023-11-13 NOTE — Progress Notes (Signed)
 Pt gives himself his flutter for CPT

## 2023-11-13 NOTE — TOC Transition Note (Signed)
 Transition of Care [facility]) - Discharge Note   Patient Details  Name: [name] MRN: [id number] Date of Birth: 1955/10/19  [facility] CM/SW Contact:  Beckie Busing, RN Phone [phone]  11/13/2023, 12:25 PM   Clinical Narrative:    Patient with discharge orders and in need of transportation. Patient is mostly wheelchair bound but can ambulate with walker. CM has set up safe transport transportation for discharge. No other TOC needs noted. TOC will sign off.          Patient Goals and CMS Choice            Discharge Placement                       Discharge Plan and Services Additional resources added to the After Visit Summary for                                       Social Drivers of Health (SDOH) Interventions SDOH Screenings   Food Insecurity: No Food Insecurity (11/10/2023)  Housing: Low Risk  (11/10/2023)  Transportation Needs: No Transportation Needs (11/10/2023)  Utilities: Not At Risk (11/10/2023)  Alcohol Screen: Low Risk  (11/06/2020)  Depression (PHQ2-9): Low Risk  (11/06/2020)  Financial Resource Strain: Low Risk  (08/10/2021)  Physical Activity: Inactive (11/06/2020)  Social Connections: Socially Isolated (11/10/2023)  Stress: Stress Concern Present (11/07/2019)  Tobacco Use: Low Risk  (11/11/2023)     Readmission Risk Interventions    11/11/2023    1:37 PM  Readmission Risk Prevention Plan  Transportation Screening Complete  Medication Review (RN Care Manager) Complete  PCP or Specialist appointment within 3-5 days of discharge Complete  HRI or Home Care Consult Complete  SW Recovery Care/Counseling Consult Complete  Palliative Care Screening Not Applicable  Skilled Nursing Facility Not Applicable

## 2023-11-13 NOTE — Progress Notes (Signed)
 Discharge instructions gone over with patient. Next medications due written on his discharge medication papers.No prescriptions needed. Patient able to transfer from bed to bedside commode easily. Case Manager arranged for safe transport transportation home. Educational papers attached to discharge papers.

## 2023-12-15 ENCOUNTER — Other Ambulatory Visit: Payer: Self-pay | Admitting: Nurse Practitioner

## 2023-12-15 DIAGNOSIS — K7689 Other specified diseases of liver: Secondary | ICD-10-CM

## 2024-01-24 ENCOUNTER — Ambulatory Visit: Admitting: Cardiology

## 2024-03-18 LAB — LAB REPORT - SCANNED: EGFR: 34

## 2024-04-10 ENCOUNTER — Other Ambulatory Visit: Payer: Self-pay | Admitting: Nurse Practitioner

## 2024-04-10 DIAGNOSIS — K7469 Other cirrhosis of liver: Secondary | ICD-10-CM

## 2024-04-29 ENCOUNTER — Encounter (HOSPITAL_COMMUNITY): Payer: Self-pay

## 2024-04-29 ENCOUNTER — Inpatient Hospital Stay (HOSPITAL_COMMUNITY)

## 2024-04-29 ENCOUNTER — Other Ambulatory Visit: Payer: Self-pay

## 2024-04-29 ENCOUNTER — Emergency Department (HOSPITAL_COMMUNITY)

## 2024-04-29 ENCOUNTER — Inpatient Hospital Stay (HOSPITAL_COMMUNITY)
Admission: EM | Admit: 2024-04-29 | Discharge: 2024-05-04 | DRG: 389 | Disposition: A | Discharge status: Home / Self Care | Attending: Internal Medicine | Admitting: Internal Medicine

## 2024-04-29 DIAGNOSIS — K5904 Chronic idiopathic constipation: Secondary | ICD-10-CM | POA: Diagnosis present

## 2024-04-29 DIAGNOSIS — E1165 Type 2 diabetes mellitus with hyperglycemia: Secondary | ICD-10-CM | POA: Diagnosis present

## 2024-04-29 DIAGNOSIS — Z9089 Acquired absence of other organs: Secondary | ICD-10-CM

## 2024-04-29 DIAGNOSIS — E785 Hyperlipidemia, unspecified: Secondary | ICD-10-CM | POA: Diagnosis present

## 2024-04-29 DIAGNOSIS — K56609 Unspecified intestinal obstruction, unspecified as to partial versus complete obstruction: Principal | ICD-10-CM | POA: Diagnosis present

## 2024-04-29 DIAGNOSIS — C189 Malignant neoplasm of colon, unspecified: Secondary | ICD-10-CM | POA: Diagnosis present

## 2024-04-29 DIAGNOSIS — Z7984 Long term (current) use of oral hypoglycemic drugs: Secondary | ICD-10-CM

## 2024-04-29 DIAGNOSIS — M109 Gout, unspecified: Secondary | ICD-10-CM | POA: Diagnosis present

## 2024-04-29 DIAGNOSIS — Z8249 Family history of ischemic heart disease and other diseases of the circulatory system: Secondary | ICD-10-CM

## 2024-04-29 DIAGNOSIS — I130 Hypertensive heart and chronic kidney disease with heart failure and stage 1 through stage 4 chronic kidney disease, or unspecified chronic kidney disease: Secondary | ICD-10-CM | POA: Diagnosis present

## 2024-04-29 DIAGNOSIS — D638 Anemia in other chronic diseases classified elsewhere: Secondary | ICD-10-CM | POA: Diagnosis present

## 2024-04-29 DIAGNOSIS — Z9884 Bariatric surgery status: Secondary | ICD-10-CM

## 2024-04-29 DIAGNOSIS — Z91013 Allergy to seafood: Secondary | ICD-10-CM

## 2024-04-29 DIAGNOSIS — N400 Enlarged prostate without lower urinary tract symptoms: Secondary | ICD-10-CM | POA: Diagnosis present

## 2024-04-29 DIAGNOSIS — Z604 Social exclusion and rejection: Secondary | ICD-10-CM | POA: Diagnosis present

## 2024-04-29 DIAGNOSIS — Z9889 Other specified postprocedural states: Secondary | ICD-10-CM

## 2024-04-29 DIAGNOSIS — N1832 Chronic kidney disease, stage 3b: Secondary | ICD-10-CM | POA: Diagnosis present

## 2024-04-29 DIAGNOSIS — E1122 Type 2 diabetes mellitus with diabetic chronic kidney disease: Secondary | ICD-10-CM | POA: Diagnosis present

## 2024-04-29 DIAGNOSIS — K565 Intestinal adhesions [bands], unspecified as to partial versus complete obstruction: Secondary | ICD-10-CM | POA: Diagnosis present

## 2024-04-29 DIAGNOSIS — E1151 Type 2 diabetes mellitus with diabetic peripheral angiopathy without gangrene: Secondary | ICD-10-CM | POA: Diagnosis present

## 2024-04-29 DIAGNOSIS — E662 Morbid (severe) obesity with alveolar hypoventilation: Secondary | ICD-10-CM | POA: Diagnosis present

## 2024-04-29 DIAGNOSIS — Z6841 Body Mass Index (BMI) 40.0 and over, adult: Secondary | ICD-10-CM | POA: Diagnosis not present

## 2024-04-29 DIAGNOSIS — Z8701 Personal history of pneumonia (recurrent): Secondary | ICD-10-CM

## 2024-04-29 DIAGNOSIS — I10 Essential (primary) hypertension: Secondary | ICD-10-CM | POA: Diagnosis present

## 2024-04-29 DIAGNOSIS — Z9049 Acquired absence of other specified parts of digestive tract: Secondary | ICD-10-CM

## 2024-04-29 DIAGNOSIS — K436 Other and unspecified ventral hernia with obstruction, without gangrene: Secondary | ICD-10-CM | POA: Diagnosis present

## 2024-04-29 DIAGNOSIS — Z794 Long term (current) use of insulin: Secondary | ICD-10-CM

## 2024-04-29 DIAGNOSIS — N179 Acute kidney failure, unspecified: Secondary | ICD-10-CM | POA: Diagnosis present

## 2024-04-29 DIAGNOSIS — Z85038 Personal history of other malignant neoplasm of large intestine: Secondary | ICD-10-CM | POA: Diagnosis not present

## 2024-04-29 DIAGNOSIS — E66813 Obesity, class 3: Secondary | ICD-10-CM | POA: Diagnosis present

## 2024-04-29 DIAGNOSIS — Z947 Corneal transplant status: Secondary | ICD-10-CM

## 2024-04-29 DIAGNOSIS — I5032 Chronic diastolic (congestive) heart failure: Secondary | ICD-10-CM | POA: Diagnosis present

## 2024-04-29 DIAGNOSIS — Z87892 Personal history of anaphylaxis: Secondary | ICD-10-CM

## 2024-04-29 DIAGNOSIS — E1142 Type 2 diabetes mellitus with diabetic polyneuropathy: Secondary | ICD-10-CM | POA: Diagnosis present

## 2024-04-29 DIAGNOSIS — Z7982 Long term (current) use of aspirin: Secondary | ICD-10-CM | POA: Diagnosis not present

## 2024-04-29 DIAGNOSIS — Z79899 Other long term (current) drug therapy: Secondary | ICD-10-CM

## 2024-04-29 DIAGNOSIS — Z888 Allergy status to other drugs, medicaments and biological substances status: Secondary | ICD-10-CM

## 2024-04-29 DIAGNOSIS — Z833 Family history of diabetes mellitus: Secondary | ICD-10-CM

## 2024-04-29 LAB — CBC WITH DIFFERENTIAL/PLATELET
Abs Immature Granulocytes: 0 K/uL (ref 0.00–0.07)
Basophils Absolute: 0 K/uL (ref 0.0–0.1)
Basophils Relative: 0 %
Eosinophils Absolute: 0 K/uL (ref 0.0–0.5)
Eosinophils Relative: 0 %
HCT: 40.8 % (ref 39.0–52.0)
Hemoglobin: 12.5 g/dL — ABNORMAL LOW (ref 13.0–17.0)
Immature Granulocytes: 0 %
Lymphocytes Relative: 16 %
Lymphs Abs: 1 K/uL (ref 0.7–4.0)
MCH: 27.4 pg (ref 26.0–34.0)
MCHC: 30.6 g/dL (ref 30.0–36.0)
MCV: 89.5 fL (ref 80.0–100.0)
Monocytes Absolute: 0.4 K/uL (ref 0.1–1.0)
Monocytes Relative: 7 %
Neutro Abs: 4.5 K/uL (ref 1.7–7.7)
Neutrophils Relative %: 77 %
Platelets: 220 K/uL (ref 150–400)
RBC: 4.56 MIL/uL (ref 4.22–5.81)
RDW: 15.7 % — ABNORMAL HIGH (ref 11.5–15.5)
WBC: 6 K/uL (ref 4.0–10.5)
nRBC: 0 % (ref 0.0–0.2)

## 2024-04-29 LAB — I-STAT CHEM 8, ED
BUN: 69 mg/dL — ABNORMAL HIGH (ref 8–23)
Calcium, Ion: 0.95 mmol/L — ABNORMAL LOW (ref 1.15–1.40)
Chloride: 100 mmol/L (ref 98–111)
Creatinine, Ser: 2.6 mg/dL — ABNORMAL HIGH (ref 0.61–1.24)
Glucose, Bld: 239 mg/dL — ABNORMAL HIGH (ref 70–99)
HCT: 41 % (ref 39.0–52.0)
Hemoglobin: 13.9 g/dL (ref 13.0–17.0)
Potassium: 8.1 mmol/L (ref 3.5–5.1)
Sodium: 133 mmol/L — ABNORMAL LOW (ref 135–145)
TCO2: 29 mmol/L (ref 22–32)

## 2024-04-29 LAB — COMPREHENSIVE METABOLIC PANEL WITH GFR
ALT: 20 U/L (ref 0–44)
AST: 24 U/L (ref 15–41)
Albumin: 3.4 g/dL — ABNORMAL LOW (ref 3.5–5.0)
Alkaline Phosphatase: 78 U/L (ref 38–126)
Anion gap: 11 (ref 5–15)
BUN: 45 mg/dL — ABNORMAL HIGH (ref 8–23)
CO2: 29 mmol/L (ref 22–32)
Calcium: 8.9 mg/dL (ref 8.9–10.3)
Chloride: 97 mmol/L — ABNORMAL LOW (ref 98–111)
Creatinine, Ser: 2.45 mg/dL — ABNORMAL HIGH (ref 0.61–1.24)
GFR, Estimated: 28 mL/min — ABNORMAL LOW (ref >60)
Glucose, Bld: 226 mg/dL — ABNORMAL HIGH (ref 70–99)
Potassium: 5.1 mmol/L (ref 3.5–5.1)
Sodium: 137 mmol/L (ref 135–145)
Total Bilirubin: 0.7 mg/dL (ref 0.0–1.2)
Total Protein: 7.6 g/dL (ref 6.5–8.1)

## 2024-04-29 LAB — LIPASE, BLOOD: Lipase: 74 U/L — ABNORMAL HIGH (ref 11–51)

## 2024-04-29 LAB — GLUCOSE, CAPILLARY: Glucose-Capillary: 163 mg/dL — ABNORMAL HIGH (ref 70–99)

## 2024-04-29 MED ORDER — DIATRIZOATE MEGLUMINE & SODIUM 66-10 % PO SOLN
90.0000 mL | Freq: Once | ORAL | Status: DC
  Filled 2024-04-29: qty 90

## 2024-04-29 MED ORDER — INSULIN ASPART 100 UNIT/ML IJ SOLN
0.0000 [IU] | Freq: Four times a day (QID) | INTRAMUSCULAR | Status: DC
  Administered 2024-04-29 – 2024-04-30 (×3): 2 [IU] via SUBCUTANEOUS
  Administered 2024-04-30: 1 [IU] via SUBCUTANEOUS
  Administered 2024-04-30: 2 [IU] via SUBCUTANEOUS
  Administered 2024-04-30: 1 [IU] via SUBCUTANEOUS

## 2024-04-29 MED ORDER — ONDANSETRON HCL 4 MG/2ML IJ SOLN
4.0000 mg | Freq: Once | INTRAMUSCULAR | Status: AC
  Administered 2024-04-29 (×2): 4 mg via INTRAVENOUS
  Filled 2024-04-29: qty 2

## 2024-04-29 MED ORDER — MORPHINE SULFATE (PF) 2 MG/ML IV SOLN
2.0000 mg | Freq: Once | INTRAVENOUS | Status: AC
  Administered 2024-04-29 (×2): 2 mg via INTRAVENOUS
  Filled 2024-04-29: qty 1

## 2024-04-29 MED ORDER — ENOXAPARIN SODIUM 100 MG/ML IJ SOSY
90.0000 mg | PREFILLED_SYRINGE | INTRAMUSCULAR | Status: DC
  Administered 2024-04-29 – 2024-05-03 (×7): 90 mg via SUBCUTANEOUS
  Filled 2024-04-29 (×6): qty 0.9

## 2024-04-29 MED ORDER — MORPHINE SULFATE (PF) 4 MG/ML IV SOLN
4.0000 mg | Freq: Once | INTRAVENOUS | Status: AC
  Administered 2024-04-29 (×2): 4 mg via INTRAVENOUS
  Filled 2024-04-29: qty 1

## 2024-04-29 MED ORDER — MIDAZOLAM HCL 2 MG/2ML IJ SOLN
2.0000 mg | Freq: Once | INTRAMUSCULAR | Status: DC
  Filled 2024-04-29: qty 2

## 2024-04-29 MED ORDER — ENOXAPARIN SODIUM 40 MG/0.4ML IJ SOSY: 40.0000 mg | PREFILLED_SYRINGE | INTRAMUSCULAR | Status: DC

## 2024-04-29 MED ORDER — LACTATED RINGERS IV SOLN: INTRAVENOUS | Status: AC

## 2024-04-29 MED ORDER — MORPHINE SULFATE (PF) 2 MG/ML IV SOLN
2.0000 mg | INTRAVENOUS | Status: DC | PRN
  Administered 2024-04-29 – 2024-04-30 (×6): 2 mg via INTRAVENOUS
  Filled 2024-04-29 (×3): qty 1

## 2024-04-29 MED ORDER — MORPHINE SULFATE (PF) 2 MG/ML IV SOLN
4.0000 mg | INTRAVENOUS | Status: DC | PRN
  Administered 2024-04-30 – 2024-05-01 (×12): 4 mg via INTRAVENOUS
  Filled 2024-04-29 (×6): qty 2

## 2024-04-29 MED ORDER — LACTATED RINGERS IV BOLUS
1000.0000 mL | Freq: Once | INTRAVENOUS | Status: AC
  Administered 2024-04-29 (×2): 1000 mL via INTRAVENOUS

## 2024-04-29 MED ORDER — SODIUM CHLORIDE 0.9 % IV BOLUS
1000.0000 mL | Freq: Once | INTRAVENOUS | Status: AC
  Administered 2024-04-29 (×2): 1000 mL via INTRAVENOUS

## 2024-04-29 MED ORDER — FENTANYL CITRATE PF 50 MCG/ML IJ SOSY
50.0000 ug | PREFILLED_SYRINGE | Freq: Once | INTRAMUSCULAR | Status: AC
  Administered 2024-04-29 (×2): 50 ug via INTRAVENOUS
  Filled 2024-04-29: qty 1

## 2024-04-29 NOTE — ED Notes (Signed)
 Patient placed onto 2 lpm due to desat.

## 2024-04-29 NOTE — ED Notes (Signed)
 77M called and made aware that patient will be coming up.

## 2024-04-29 NOTE — ED Notes (Signed)
 NG insertion attempted twice in both nostrils. Patient refused a reattempt with medication. Patient educated on risks for delaying procedure. Hospitalist Mdala made aware.

## 2024-04-29 NOTE — Consult Note (Signed)
 Consult Note  [name] 27-Sep-1955  [id number].    Requesting MD: Ozell Marine, DO Chief Complaint/Reason for Consult: SBO  HPI:  Patient is a 68 year old male with PMH significant for morbid obesity, CHF, T2DM with hx of diabetic foot ulcer and peripheral neuropathy, HTN, HLD, OSA on BiPAP, hx of colon cancer s/p resection, chronic constipation, PAD, anemia of chronic disease, BPH, CKD who presented to the ED with abdominal pain and vomiting. Patient reports chronic constipation. He reports Friday he began took a laxative and then shortly after began having central/epigastric abdominal pain with n/v. Last BM Thursday, 8/7 and was small. Last episode of flatus was Saturday, 8/9. Reports this feels similar to prior SBO and presented for evaluation.   He does have a known chronically incarcerated supraumbilical hernia for several years, but this does tend to get smaller and softer after BMs. Has been seen in our practice for elective hernia repair and it was recommended he lose weight prior to this. . Seen most recently by our team in February of this year for a SBO that resolved without surgical intervention.   He is not on any blood thinners. He does have an allergy to shellfish with reaction of anaphylaxis, tolerated PO gastrografin  in February.   Previous abdominal surgery notable for sigmoid colectomy in 2007 by Dr. Effie for cancer, lap band placement in 2011 ([facility]) (Lap-Band port has been removed but band still in place - he reports port was removed for infection), open incisional hernia repair with mesh by Dr. Ebbie in 2015.  The latter op note describes lysis of adhesions x 1 hour and placement of a preperitoneal 20 x 25 cm Ventralight mesh.  Peritoneum was closed with #1 Novafil suture.   ROS: As above, see hpi  Family History  Problem Relation Age of Onset   Hepatitis Mother        hepatitis C   Diabetes Mother    Congestive Heart Failure Mother    CAD Mother     Heart attack Brother 46   Diabetes Father    Heart failure Brother    Stomach cancer Neg Hx    Colon cancer Neg Hx     Past Medical History:  Diagnosis Date   Anemia    iron   CHF (congestive heart failure) (HCC)    Presumed diastolic. Echo (06/07) w.EF 45%, severe posterior HK, mild LV hypertrophy, No further work-up of abnormal echo was done. pt denies CHF   Colon cancer (HCC) 2007   s/p sigmoid colectomy   Diabetes mellitus    Has Hx of diabetic foot ulcer & peripheral neuropathy  type2   Dyspnea    w/ activity   History of PFTs 05/2009   Mild Obstructive defect   HTN (hypertension)    Hyperlipidemia    Morbid obesity (HCC)    OSA on CPAP    Pneumonia    week ago     Past Surgical History:  Procedure Laterality Date   BARIATRIC SURGERY  12/2009   Lap Band/At [facility]   COLONOSCOPY  multiple   COLONOSCOPY WITH PROPOFOL  N/A 11/26/2012   Procedure: COLONOSCOPY WITH PROPOFOL ;  Surgeon: Lupita FORBES Commander, MD;  Location: [facility];  Service: Endoscopy;  Laterality: N/A;  may need pre appt. with anesthesia due to morbid obesity   COLONOSCOPY WITH PROPOFOL  N/A 07/11/2017   Procedure: COLONOSCOPY WITH PROPOFOL ;  Surgeon: Commander Lupita FORBES, MD;  Location: [facility];  Service: Endoscopy;  Laterality: N/A;   CORNEAL TRANSPLANT  1980 & 1984   '80/Right  '84/Left   EYE SURGERY Right 05/2017   HERNIA REPAIR     HIP SURGERY     bilateral   INCISIONAL HERNIA REPAIR N/A 12/18/2013   Procedure:  REPAIR OF INCARCERATED INCISIONAL HERNIA;  Surgeon: Donnice Bury, MD;  Location: [facility] OR;  Service: General;  Laterality: N/A;   INSERTION OF MESH N/A 12/18/2013   Procedure: INSERTION OF MESH;  Surgeon: Donnice Bury, MD;  Location: [facility] OR;  Service: General;  Laterality: N/A;   Sigmoid Colectomy  10/2005   [facility]   TONSILLECTOMY      Social History:  reports that he has never smoked. He has never used smokeless tobacco. He reports that he does not currently use alcohol. He reports that  he does not use drugs.  Allergies:  Allergies  Allergen Reactions   Shellfish Allergy Anaphylaxis   Lisinopril  Cough   Relistor  [Methylnaltrexone  Bromide] Other (See Comments)    confusion   Lovastatin Itching    (Not in a hospital admission)   Blood pressure 132/86, pulse 67, temperature 98.1 F (36.7 C), temperature source Oral, resp. rate 15, height 6' 1 (1.854 m), weight (!) 181 kg, SpO2 100%. Physical Exam:  General: pleasant, WD, morbidly obese male who is laying in bed in NAD HEENT: head is normocephalic, atraumatic.  Sclera are noninjected.  Heart: regular, rate, and rhythm.  Lungs: Respiratory effort nonlabored Abd: Soft, central/epigastric ttp that is mainly over his hernia without rigidity or guarding. Hernia is partially reducible without overlying skin changes. Prior abdominal scars noted and well healed.  Psych: A&Ox3 with an appropriate affect.   Results for orders placed or performed during the hospital encounter of 04/29/24 (from the past 48 hours)  CBC with Differential     Status: Abnormal   Collection Time: 04/29/24 10:17 AM  Result Value Ref Range   WBC 6.0 4.0 - 10.5 K/uL   RBC 4.56 4.22 - 5.81 MIL/uL   Hemoglobin 12.5 (L) 13.0 - 17.0 g/dL   HCT 59.1 60.9 - 47.9 %   MCV 89.5 80.0 - 100.0 fL   MCH 27.4 26.0 - 34.0 pg   MCHC 30.6 30.0 - 36.0 g/dL   RDW 84.2 (H) 88.4 - 84.4 %   Platelets 220 150 - 400 K/uL   nRBC 0.0 0.0 - 0.2 %   Neutrophils Relative % 77 %   Neutro Abs 4.5 1.7 - 7.7 K/uL   Lymphocytes Relative 16 %   Lymphs Abs 1.0 0.7 - 4.0 K/uL   Monocytes Relative 7 %   Monocytes Absolute 0.4 0.1 - 1.0 K/uL   Eosinophils Relative 0 %   Eosinophils Absolute 0.0 0.0 - 0.5 K/uL   Basophils Relative 0 %   Basophils Absolute 0.0 0.0 - 0.1 K/uL   Immature Granulocytes 0 %   Abs Immature Granulocytes 0.00 0.00 - 0.07 K/uL    Comment: Performed at [facility], [address] [place], [place] [address]  I-stat chem 8, ED (not at [facility], [facility] or  [facility])     Status: Abnormal   Collection Time: 04/29/24 10:22 AM  Result Value Ref Range   Sodium 133 (L) 135 - 145 mmol/L   Potassium 8.1 (HH) 3.5 - 5.1 mmol/L   Chloride 100 98 - 111 mmol/L   BUN 69 (H) 8 - 23 mg/dL   Creatinine, Ser 7.39 (H) 0.61 - 1.24 mg/dL   Glucose, Bld 760 (H) 70 -  99 mg/dL    Comment: Glucose reference range applies only to samples taken after fasting for at least 8 hours.   Calcium , Ion 0.95 (L) 1.15 - 1.40 mmol/L   TCO2 29 22 - 32 mmol/L   Hemoglobin 13.9 13.0 - 17.0 g/dL   HCT 58.9 60.9 - 47.9 %   Comment NOTIFIED PHYSICIAN   Comprehensive metabolic panel with GFR     Status: Abnormal   Collection Time: 04/29/24 12:49 PM  Result Value Ref Range   Sodium 137 135 - 145 mmol/L   Potassium 5.1 3.5 - 5.1 mmol/L   Chloride 97 (L) 98 - 111 mmol/L   CO2 29 22 - 32 mmol/L   Glucose, Bld 226 (H) 70 - 99 mg/dL    Comment: Glucose reference range applies only to samples taken after fasting for at least 8 hours.   BUN 45 (H) 8 - 23 mg/dL   Creatinine, Ser 7.54 (H) 0.61 - 1.24 mg/dL   Calcium  8.9 8.9 - 10.3 mg/dL   Total Protein 7.6 6.5 - 8.1 g/dL   Albumin 3.4 (L) 3.5 - 5.0 g/dL   AST 24 15 - 41 U/L   ALT 20 0 - 44 U/L   Alkaline Phosphatase 78 38 - 126 U/L   Total Bilirubin 0.7 0.0 - 1.2 mg/dL   GFR, Estimated 28 (L) >60 mL/min    Comment: (NOTE) Calculated using the CKD-EPI Creatinine Equation (2021)    Anion gap 11 5 - 15    Comment: Performed at [facility], [address], [place], [place] [address]  Lipase, blood     Status: Abnormal   Collection Time: 04/29/24 12:49 PM  Result Value Ref Range   Lipase 74 (H) 11 - 51 U/L    Comment: Performed at [facility], [address] [place], [place] [address]   *Note: Due to a large number of results and/or encounters for the requested time period, some results have not been displayed. A complete set of results can be found in Results Review.   CT ABDOMEN PELVIS WO CONTRAST Result Date:  04/29/2024 CLINICAL DATA:  Abdominal pain, acute, nonlocalized Bowel obstruction suspected EXAM: CT ABDOMEN AND PELVIS WITHOUT CONTRAST TECHNIQUE: Multidetector CT imaging of the abdomen and pelvis was performed following the standard protocol without IV contrast. RADIATION DOSE REDUCTION: This exam was performed according to the departmental dose-optimization program which includes automated exposure control, adjustment of the mA and/or kV according to patient size and/or use of iterative reconstruction technique. COMPARISON:  Abdominopelvic CT 11/10/2023 and 12/01/2022. FINDINGS: Lower chest: Mild atelectasis at both lung bases. No confluent airspace disease, pleural or pericardial effusion. Aortic and coronary artery atherosclerosis noted. Hepatobiliary: No focal hepatic abnormalities are identified on noncontrast imaging. A calcified gallstone is again noted. No evidence of gallbladder wall thickening, surrounding inflammation or biliary ductal dilatation. Pancreas: Generalized atrophy. No ductal dilatation or surrounding inflammation. Spleen: Normal in size without focal abnormality. Adrenals/Urinary Tract: Both adrenal glands appear normal. Bilateral renal cortical thinning. No evidence of urinary tract calculus, hydronephrosis or perinephric soft tissue stranding. The bladder appears unremarkable for its degree of distention. Stomach/Bowel: No enteric contrast administered. Laparoscopic gastric band remains in place. There is increased distention of a fluid-filled stomach and mildly increased dilatation of fluid-filled proximal to mid small bowel. Again demonstrated is abrupt transition in small bowel caliber deep to the midline incision, suspicious for recurrent small bowel obstruction secondary to adhesions. The distal small bowel is decompressed. The appendix  is not as well seen currently, although demonstrates no acute abnormality. The colon is decompressed and contains a moderate amount of stool. There  is a stable supraumbilical hernia containing a portion of the transverse colon. Vascular/Lymphatic: There are no enlarged abdominal or pelvic lymph nodes. Aortic and branch vessel atherosclerosis. No evidence of aneurysm. Reproductive: The prostate gland and seminal vesicles appear unremarkable. Other: As above, postsurgical changes in the anterior abdominal wall with a stable supraumbilical hernia containing a portion of the transverse colon. No ascites, focal extraluminal fluid collection or pneumoperitoneum. Musculoskeletal: No acute or significant osseous findings. Postsurgical changes from previous bilateral femoral neck pinning. Chronic collapse of the right femoral head with severe asymmetric right hip degenerative changes. Mild multilevel spondylosis. IMPRESSION: 1. Findings are consistent with recurrent mid small bowel obstruction with transition point deep to the midline incision, likely secondary to adhesions. 2. No evidence of bowel perforation or abscess. 3. Stable supraumbilical hernia containing a portion of the transverse colon. 4. Cholelithiasis without evidence of cholecystitis or biliary ductal dilatation. 5.  Aortic Atherosclerosis (ICD10-I70.0). Electronically Signed   By: Elsie Perone M.D.   On: 04/29/2024 14:47   DG Chest Portable 1 View Result Date: 04/29/2024 EXAM: 1 VIEW XRAY OF THE CHEST 04/29/2024 10:31:31 AM COMPARISON: 11/10/2023 CLINICAL HISTORY: Chest pain. Reason for exam: chest pain and SOB FINDINGS: LUNGS AND PLEURA: No focal pulmonary opacity. No pulmonary edema. No pleural effusion. No pneumothorax. HEART AND MEDIASTINUM: No acute abnormality of the cardiac and mediastinal silhouettes. BONES AND SOFT TISSUES: No acute osseous abnormality. IMPRESSION: 1. No acute cardiopulmonary pathology. Electronically signed by: evalene coho 04/29/2024 11:16 AM EDT RP Workstation: [id number]      Assessment/Plan SBO Chronically incarcerated supraumbilical hernia containing  transverse colon on CT - Hx of sigmoid colectomy in 2007 for cancer by Dr. Effie, lap band placement at [facility] in [facility] (Lap-band in place but port has been removed), open incisional hernia repair with mesh by Dr. Ebbie in 2015. In 2015 op note describes a LOA x1 hr and placement of a preperitoneal 20 x 25 cm Ventralight mesh. Peritoneum was closed with #1 Novafil suture  - CT today with mid small bowel obstruction with transition deep to prior midline incision, stable supraumbilical hernia containing portion of transverse colon. The hernia does not appear to be the site of obstruction.  - Patient seen for the same in February of this year and resolved non-operatively  - No indication for emergent surgical intervention  - Place NGT for decompression and keep NPO - Start SBO protocol - Keep K >=4, Phos >= 3, Mg >= 2 and mobilize for bowel function. Okay to clamp NGT for mobilization.  - Hopefully patient will improve with conservative management. If patient fails to improve with conservative management, they may require exploratory surgery during admission - If patient improves with conservative management, he would benefit from follow up to discuss weight loss before a possible elective surgery for LOA, Lap band removal and hernia repair.  - Agree with medical admission. We will follow with you.   FEN: NPO, IVF per primary, NGT to LIWS once placed VTE: SCDs, ok to have LMWH or SQH from surgery standpoint ID: no current abx, afebrile and no leukocytosis   - per admitting -  morbid obesity - BMI 52.65 CHF T2DM with hx of diabetic foot ulcer and peripheral neuropathy HTN HLD OSA on BiPAP hx of colon cancer s/p resection chronic constipation PAD anemia of chronic disease BPH CKD stage IIIb-IV  I reviewed ED provider notes, last 24 h vitals and pain scores, last 48 h intake and output, last 24 h labs and trends, and last 24 h imaging results.  This care required high  level of medical  decision making.   Ozell CHRISTELLA Shaper, [facility] 04/29/2024, 3:21 PM Please see Amion for pager number during day hours 7:00am-4:30pm

## 2024-04-29 NOTE — ED Provider Notes (Signed)
 Patient is 68 year old male with history of SBO 3 days of abdominal pain found to have SBO today.  Surgery has already been consulted.  They recommended NG tube and admission to the hospital service.  Physical Exam  BP 132/86   Pulse 67   Temp 98.1 F (36.7 C) (Oral)   Resp 15   Ht 6' 1 (1.854 m)   Wt (!) 181 kg   SpO2 100%   BMI 52.65 kg/m   Physical Exam Vitals and nursing note reviewed.  Constitutional:      General: He is not in acute distress. HENT:     Head: Normocephalic and atraumatic.     Nose: Nose normal.  Eyes:     General: No scleral icterus. Cardiovascular:     Rate and Rhythm: Normal rate and regular rhythm.     Pulses: Normal pulses.     Heart sounds: Normal heart sounds.  Pulmonary:     Effort: Pulmonary effort is normal. No respiratory distress.     Breath sounds: No wheezing.  Abdominal:     Palpations: Abdomen is soft.     Tenderness: There is generalized abdominal tenderness.     Comments: No focal abdominal tenderness.  No guarding or rebound  Musculoskeletal:     Cervical back: Normal range of motion.     Right lower leg: No edema.     Left lower leg: No edema.  Skin:    General: Skin is warm and dry.     Capillary Refill: Capillary refill takes less than 2 seconds.  Neurological:     Mental Status: He is alert. Mental status is at baseline.  Psychiatric:        Mood and Affect: Mood normal.        Behavior: Behavior normal.     Procedures  Procedures  ED Course / MDM    Medical Decision Making Amount and/or Complexity of Data Reviewed Labs: ordered. Radiology: ordered.  Risk Prescription drug management. Decision regarding hospitalization.   AKI likely secondary to SBO. - IV hydration ordered  SBO - Surgery aware  Patient admitted to hospitalist service.     Neldon Inoue [place], [place] 04/29/24 [id number]    Cleotilde Rogue, MD 04/30/24 [phone]

## 2024-04-29 NOTE — Progress Notes (Signed)
 Pt. Asked to drop the NG tube ,stated they have been trying and push my stomach a lot and I need to rest for tonight.

## 2024-04-29 NOTE — ED Provider Notes (Signed)
 [place] [facility] AT [facility] Provider Note   CSN: [id number] Arrival date & time: 04/29/24  9046     Patient presents with: Abdominal Pain and Emesis   [name] is a 68 y.o. male.  Patient with past history significant for CHF, colon cancer, bowel obstruction presents to the emergency department with concerns of abdominal pain and vomiting.  Reports he has been having vomiting for the last 3 days.  Endorses some constipation for the last week but still endorses able to pass gas.  Has tried using laxatives since Friday night without improvement in symptoms.  Does report this feels somewhat similar to prior episodes of bowel obstruction.    Abdominal Pain Associated symptoms: vomiting   Emesis Associated symptoms: abdominal pain        Prior to Admission medications   Medication Sig Start Date End Date Taking? Authorizing Provider  Accu-Chek Softclix Lancets lancets CHECK BLOOD SUGAR 2 TIMES  DAILY 10/26/21   Joshua Debby CROME, MD  Albuterol  Sulfate (PROAIR  RESPICLICK) 108 (90 Base) MCG/ACT AEPB Inhale 1 puff into the lungs 4 (four) times daily as needed. Patient taking differently: Inhale 1 puff into the lungs 4 (four) times daily as needed (SOB). 02/05/21   Joshua Debby CROME, MD  allopurinol  (ZYLOPRIM ) 100 MG tablet Take 1 tablet (100 mg total) by mouth daily. 11/30/21   Regalado, Belkys A, MD  aspirin  EC 81 MG tablet Take 1 tablet (81 mg total) by mouth daily. 04/17/20   Joshua Debby CROME, MD  atorvastatin  (LIPITOR) 80 MG tablet Take 1 tablet (80 mg total) by mouth daily. 07/13/23   Rolan Ezra RAMAN, MD  bisoprolol  (ZEBETA ) 10 MG tablet Take 1 tablet (10 mg total) by mouth every evening. 04/19/21   Joshua Debby CROME, MD  blood glucose meter kit and supplies KIT Inject 1 each into the skin 4 (four) times daily. Use to check blood sugar three times daily. Dispense Freestyle Libre according to insurance preference. DX: E11.8 10/26/21   Joshua Debby CROME, MD  blood glucose  meter kit and supplies Dispense based on patient and insurance preference. Use up to four times daily as directed. (FOR ICD-10 E10.9, E11.9). 10/26/21   Joshua Debby CROME, MD  Blood Glucose Monitoring Suppl (ACCU-CHEK AVIVA CONNECT) w/Device KIT 1 Act by Does not apply route 3 (three) times daily as needed. 10/26/21   Joshua Debby CROME, MD  colchicine  0.6 MG tablet Take 0.6 mg by mouth daily.    [provider]  Continuous Blood Gluc Sensor (FREESTYLE LIBRE 2 SENSOR) MISC 1 Act by Does not apply route daily. 07/27/21   Joshua Debby CROME, MD  empagliflozin  (JARDIANCE ) 10 MG TABS tablet Take 1 tablet (10 mg total) by mouth daily before breakfast. 03/16/23   Rolan Ezra RAMAN, MD  ferrous sulfate  325 (65 FE) MG tablet Take 1 tablet (325 mg total) by mouth 2 (two) times daily with a meal. 10/07/21   Joshua Debby CROME, MD  furosemide  (LASIX ) 20 MG tablet Take 3 tablets (60 mg total) by mouth 2 (two) times daily. 11/13/23   Jillian Buttery, MD  gabapentin  (NEURONTIN ) 300 MG capsule Take 300 mg by mouth daily as needed (pain). 07/20/23   [provider]  glucose blood (ACCU-CHEK AVIVA PLUS) test strip Use to test blood sugar twice daily. DX E11.8 10/21/20   Joshua Debby CROME, MD  HYDROcodone -acetaminophen  (NORCO) 10-325 MG tablet Take 1 tablet by mouth every 6 (six) hours as needed for severe pain (pain  score 7-10). 03/25/22   [provider]  Insulin  Pen Needle (ULTICARE SHORT PEN NEEDLES) 31G X 8 MM MISC USE TWO PEN NEEDLES DAILY WITH INSULIN  PENS 07/19/21   Joshua Debby CROME, MD  LANTUS SOLOSTAR 100 UNIT/ML Solostar Pen Inject 65 Units into the skin daily. 11/07/23   [provider]  Magnesium Hydroxide (DULCOLAX PO) Take 1 tablet by mouth daily.    [provider]  metolazone  (ZAROXOLYN ) 2.5 MG tablet TAKE 1 TABLET (2.5 MG TOTAL) BY MOUTH ONCE A WEEK. EVERY TUESDAY 04/11/23   Rolan Ezra RAMAN, MD  Multiple Vitamins-Minerals (MULTIVITAMIN ADULT PO) Take 1 tablet by mouth daily.     [provider]  potassium chloride  SA (KLOR-CON  M) 20 MEQ tablet TAKE 1 TABLET BY MOUTH ONCE A WEEK. EVERY TUESDAY 04/11/23   Rolan Ezra RAMAN, MD  RESTASIS  0.05 % ophthalmic emulsion PLACE 1 DROP INTO BOTH EYES TWICE A DAY 05/07/21   Joshua Debby CROME, MD    Allergies: Shellfish allergy, Lisinopril , Relistor  [methylnaltrexone  bromide], and Lovastatin    Review of Systems  Gastrointestinal:  Positive for abdominal pain and vomiting.  All other systems reviewed and are negative.   Updated Vital Signs BP 132/86   Pulse 67   Temp 98.1 F (36.7 C) (Oral)   Resp 15   Ht 6' 1 (1.854 m)   Wt (!) 181 kg   SpO2 100%   BMI 52.65 kg/m   Physical Exam Vitals and nursing note reviewed.  Constitutional:      General: He is not in acute distress.    Appearance: He is well-developed.  HENT:     Head: Normocephalic and atraumatic.  Eyes:     Conjunctiva/sclera: Conjunctivae normal.  Cardiovascular:     Rate and Rhythm: Normal rate and regular rhythm.     Heart sounds: No murmur heard. Pulmonary:     Effort: Pulmonary effort is normal. No respiratory distress.     Breath sounds: Normal breath sounds.  Abdominal:     General: Bowel sounds are decreased.     Palpations: Abdomen is soft.     Tenderness: There is generalized abdominal tenderness and tenderness in the epigastric area. There is guarding and rebound.  Musculoskeletal:        General: No swelling.     Cervical back: Neck supple.  Skin:    General: Skin is warm and dry.     Capillary Refill: Capillary refill takes less than 2 seconds.  Neurological:     Mental Status: He is alert.  Psychiatric:        Mood and Affect: Mood normal.     (all labs ordered are listed, but only abnormal results are displayed) Labs Reviewed  CBC WITH DIFFERENTIAL/PLATELET - Abnormal; Notable for the following components:      Result Value   Hemoglobin 12.5 (*)    RDW 15.7 (*)    All other components within normal limits   COMPREHENSIVE METABOLIC PANEL WITH GFR - Abnormal; Notable for the following components:   Chloride 97 (*)    Glucose, Bld 226 (*)    BUN 45 (*)    Creatinine, Ser 2.45 (*)    Albumin 3.4 (*)    GFR, Estimated 28 (*)    All other components within normal limits  LIPASE, BLOOD - Abnormal; Notable for the following components:   Lipase 74 (*)    All other components within normal limits  I-STAT CHEM 8, ED - Abnormal; Notable for the following  components:   Sodium 133 (*)    Potassium 8.1 (*)    BUN 69 (*)    Creatinine, Ser 2.60 (*)    Glucose, Bld 239 (*)    Calcium , Ion 0.95 (*)    All other components within normal limits    EKG: EKG Interpretation Date/Time:  Monday April 29 2024 10:01:29 EDT Ventricular Rate:  68 PR Interval:  173 QRS Duration:  124 QT Interval:  450 QTC Calculation: 479 R Axis:   -3  Text Interpretation: Sinus rhythm Nonspecific intraventricular conduction delay Nonspecific T abnormalities, lateral leads Confirmed by Pamella Sharper [phone]) on 04/29/2024 11:27:24 AM  Radiology: CT ABDOMEN PELVIS WO CONTRAST Result Date: 04/29/2024 CLINICAL DATA:  Abdominal pain, acute, nonlocalized Bowel obstruction suspected EXAM: CT ABDOMEN AND PELVIS WITHOUT CONTRAST TECHNIQUE: Multidetector CT imaging of the abdomen and pelvis was performed following the standard protocol without IV contrast. RADIATION DOSE REDUCTION: This exam was performed according to the departmental dose-optimization program which includes automated exposure control, adjustment of the mA and/or kV according to patient size and/or use of iterative reconstruction technique. COMPARISON:  Abdominopelvic CT 11/10/2023 and 12/01/2022. FINDINGS: Lower chest: Mild atelectasis at both lung bases. No confluent airspace disease, pleural or pericardial effusion. Aortic and coronary artery atherosclerosis noted. Hepatobiliary: No focal hepatic abnormalities are identified on noncontrast imaging. A calcified  gallstone is again noted. No evidence of gallbladder wall thickening, surrounding inflammation or biliary ductal dilatation. Pancreas: Generalized atrophy. No ductal dilatation or surrounding inflammation. Spleen: Normal in size without focal abnormality. Adrenals/Urinary Tract: Both adrenal glands appear normal. Bilateral renal cortical thinning. No evidence of urinary tract calculus, hydronephrosis or perinephric soft tissue stranding. The bladder appears unremarkable for its degree of distention. Stomach/Bowel: No enteric contrast administered. Laparoscopic gastric band remains in place. There is increased distention of a fluid-filled stomach and mildly increased dilatation of fluid-filled proximal to mid small bowel. Again demonstrated is abrupt transition in small bowel caliber deep to the midline incision, suspicious for recurrent small bowel obstruction secondary to adhesions. The distal small bowel is decompressed. The appendix is not as well seen currently, although demonstrates no acute abnormality. The colon is decompressed and contains a moderate amount of stool. There is a stable supraumbilical hernia containing a portion of the transverse colon. Vascular/Lymphatic: There are no enlarged abdominal or pelvic lymph nodes. Aortic and branch vessel atherosclerosis. No evidence of aneurysm. Reproductive: The prostate gland and seminal vesicles appear unremarkable. Other: As above, postsurgical changes in the anterior abdominal wall with a stable supraumbilical hernia containing a portion of the transverse colon. No ascites, focal extraluminal fluid collection or pneumoperitoneum. Musculoskeletal: No acute or significant osseous findings. Postsurgical changes from previous bilateral femoral neck pinning. Chronic collapse of the right femoral head with severe asymmetric right hip degenerative changes. Mild multilevel spondylosis. IMPRESSION: 1. Findings are consistent with recurrent mid small bowel obstruction  with transition point deep to the midline incision, likely secondary to adhesions. 2. No evidence of bowel perforation or abscess. 3. Stable supraumbilical hernia containing a portion of the transverse colon. 4. Cholelithiasis without evidence of cholecystitis or biliary ductal dilatation. 5.  Aortic Atherosclerosis (ICD10-I70.0). Electronically Signed   By: Elsie Perone M.D.   On: 04/29/2024 14:47   DG Chest Portable 1 View Result Date: 04/29/2024 EXAM: 1 VIEW XRAY OF THE CHEST 04/29/2024 10:31:31 AM COMPARISON: 11/10/2023 CLINICAL HISTORY: Chest pain. Reason for exam: chest pain and SOB FINDINGS: LUNGS AND PLEURA: No focal pulmonary opacity. No pulmonary edema. No pleural effusion.  No pneumothorax. HEART AND MEDIASTINUM: No acute abnormality of the cardiac and mediastinal silhouettes. BONES AND SOFT TISSUES: No acute osseous abnormality. IMPRESSION: 1. No acute cardiopulmonary pathology. Electronically signed by: evalene coho 04/29/2024 11:16 AM EDT RP Workstation: [id number]     Procedures   Medications Ordered in the ED  midazolam  (VERSED ) injection 2 mg (has no administration in time range)  lactated ringers  bolus 1,000 mL (0 mLs Intravenous Stopped 04/29/24 1302)  ondansetron  (ZOFRAN ) injection 4 mg (4 mg Intravenous Given 04/29/24 1028)  fentaNYL  (SUBLIMAZE ) injection 50 mcg (50 mcg Intravenous Given 04/29/24 1038)  morphine  (PF) 4 MG/ML injection 4 mg (4 mg Intravenous Given 04/29/24 1237)  morphine  (PF) 2 MG/ML injection 2 mg (2 mg Intravenous Given 04/29/24 1503)                                    Medical Decision Making Amount and/or Complexity of Data Reviewed Labs: ordered. Radiology: ordered.  Risk Prescription drug management. Decision regarding hospitalization.   This patient presents to the ED for concern of abdominal pain, this involves an extensive number of treatment options, and is a complaint that carries with it a high risk of complications and morbidity.  The  differential diagnosis includes gastroenteritis, bowel obstruction, diverticulitis, ventral hernia   Co morbidities that complicate the patient evaluation  Past history of bowel obstruction, colon cancer, CHF   Lab Tests:  I Ordered, and personally interpreted labs.  The pertinent results include: CBC with stable hemoglobin of 12.5, CMP with baseline creatinine and GFR with 2.45 and 28 respectively, I believe i-STAT Chem-8 was likely erroneous showing potassium at 8.11 CMP she is a more accurate potassium of 5.1 with no EKG changes seen.  Lipase unremarkable at 74 likely reactive due to vomiting   Imaging Studies ordered:  I ordered imaging studies including chest x-ray, CT abdomen pelvis without contrast I independently visualized and interpreted imaging which showed no acute cardiopulmonary process,Findings are consistent with recurrent mid small bowel obstruction with transition point deep to the midline incision, likely secondary to adhesions. 2. No evidence of bowel perforation or abscess. 3. Stable supraumbilical hernia containing a portion of the transverse colon. 4. Cholelithiasis without evidence of cholecystitis or biliary ductal dilatation. 5.  Aortic Atherosclerosis (ICD10-I70.0).Findings are consistent with recurrent mid small bowel obstruction with transition point deep to the midline incision, likely secondary to adhesions. 2. No evidence of bowel perforation or abscess. 3. Stable supraumbilical hernia containing a portion of the transverse colon. 4. Cholelithiasis without evidence of cholecystitis or biliary ductal dilatation. 5.  Aortic Atherosclerosis (ICD10-I70.0). I agree with the radiologist interpretation   Cardiac Monitoring: / EKG:  The patient was maintained on a cardiac monitor.  I personally viewed and interpreted the cardiac monitored which showed an underlying rhythm of: Sinus rhythm   Consultations Obtained:  I requested consultation with the general surgery,   and discussed lab and imaging findings as well as pertinent plan - they recommend: Spoke with Michael Maczis, general surgery PA, who advised admission and surgery will consult on patient.   Problem List / ED Course / Critical interventions / Medication management  Patient with past history significant for bowel obstruction, colon cancer, CHF here with concerns of abdominal pain.  Reports having abdominal pain and vomiting for the last 3 days.  No reported hematemesis or ground coffee emesis but does report that his emesis has had a brown color  in nature.  No reported hematochezia or melanotic stools.  Still passing gas. On exam, patient has generalized abdominal tenderness but focal towards the epigastrium.  Bowel sounds are slightly decreased.  No abnormal heart or lung sounds.  Concern at this time for likely recurrent bowel obstruction.  Will proceed with labs and imaging.  Patient has a recent history of worsening renal function unclear will be able to tolerate contrast.  Will obtain i-STAT for possible evaluation. I-STAT Chem-8 is likely unreliable as potassium shows at 8.1.  Will wait for confirmation with CMP.  CBC shows baseline hemoglobin at 12.5.  Lipase slightly elevated 74 likely reactive. CMP shows patient's baseline renal function with creatinine 2.45 and GFR at 28.  Chest x-ray negative for acute findings.  Will proceed with CT abdomen pelvis at this time. CT of the pelvis is concerning for recurrent mid small bowel obstruction with transition point deep to the midline incision, likely secondary to adhesions.  No evidence of perforation or abscess.  Consult to general surgery placed. Spoke with Michael Maczis, general surgery PA, who advised NG tube placement and medicine admission. Medicine admission placed. I ordered medication including Zofran , fentanyl , morphine , fluids for nausea, pain, hydration Reevaluation of the patient after these medicines showed that the patient improved I  have reviewed the patients home medicines and have made adjustments as needed  3:43 PM Care of Ishaan Diehl transferred to [facility] and Dr. Cleotilde at the end of my shift as the patient will require reassessment once labs/imaging have resulted. Patient presentation, ED course, and plan of care discussed with review of all pertinent labs and imaging. Please see his/her note for further details regarding further ED course and disposition. Plan at time of handoff is medicine admission per surgery recommendations. General surgery will consult on patient. This may be altered or completely changed at the discretion of the oncoming team pending results of further workup.  Social Determinants of Health:  Recurrent bowel obstruction   Test / Admission - Considered:  Given findings of bowel obstruction, patient requiring admission. Pending medicine admission at this time.  Final diagnoses:  Small bowel obstruction [facility])    ED Discharge Orders     None          Cecily Legrand LABOR, PA-C 04/29/24 1543    Pamella Ozell LABOR, DO 05/06/24 1116

## 2024-04-29 NOTE — H&P (Signed)
 History and Physical    Patient: [name] FMW:[id number] DOB: October 17, 1955 DOA: 04/29/2024 DOS: the patient was seen and examined on 04/29/2024 PCP: Delores Rojelio Caldron, NP  Patient coming from: Home  Chief Complaint:  Chief Complaint  Patient presents with   Abdominal Pain   Emesis   HPI: [name] is a 68 y.o. man with PMH of T2DM, CHF, gout, HLD, class III obesity, history of colon cancer s/p resection, supraumbilical hernia, chronic constipation who presented to the ED with pain, vomiting for 3 days.  The patient states he develops severe constipation every few months.  3 days ago, he noticed he was constipated.  His doctor gave him a laxative (patient cannot recall the name) and he took it for the next 2 days.  He reports that every time he took it he would vomit.  He subsequently started vomiting water.  Today, he developed brown emesis and decided he needed to come to the ED.  He states he has a known history of hernia and has had bowel obstructions in the past.  He has ongoing abdominal pain.  He last passed gas 2 days ago.  Vomitus is brown but with no fresh blood.  His respiratory status has been at baseline.  He has not noticed any increased shortness of breath.  No lower extremity swelling.  He did have some right subcostal pain yesterday.  On presentation to the ED, patient was noted to be hemodynamically stable.  CBC was at baseline.  CMP was at baseline apart from mildly elevated creatinine from baseline.  Lipase was 74.  Chest x-ray was unremarkable. CT of the abdomen had findings consistent with recurrent mid small bowel obstruction with transition point deep to the midline incision likely secondary to adhesions.  No evidence of bowel perforation or abscess.  Stable supraumbilical hernia containing a portion of the transverse colon.  Patient was seen by general surgery in the ED.  They recommended conservative management with NG tube, IV fluids.  Patient is being  admitted to the hospital medicine service for further management.   Review of Systems: As mentioned in the history of present illness. All other systems reviewed and are negative. Past Medical History:  Diagnosis Date   Anemia    iron   CHF (congestive heart failure) (HCC)    Presumed diastolic. Echo (06/07) w.EF 45%, severe posterior HK, mild LV hypertrophy, No further work-up of abnormal echo was done. pt denies CHF   Colon cancer (HCC) 2007   s/p sigmoid colectomy   Diabetes mellitus    Has Hx of diabetic foot ulcer & peripheral neuropathy  type2   Dyspnea    w/ activity   History of PFTs 05/2009   Mild Obstructive defect   HTN (hypertension)    Hyperlipidemia    Morbid obesity (HCC)    OSA on CPAP    Pneumonia    week ago    Past Surgical History:  Procedure Laterality Date   BARIATRIC SURGERY  12/2009   Lap Band/At [facility]   COLONOSCOPY  multiple   COLONOSCOPY WITH PROPOFOL  N/A 11/26/2012   Procedure: COLONOSCOPY WITH PROPOFOL ;  Surgeon: Lupita FORBES Commander, MD;  Location: [facility];  Service: Endoscopy;  Laterality: N/A;  may need pre appt. with anesthesia due to morbid obesity   COLONOSCOPY WITH PROPOFOL  N/A 07/11/2017   Procedure: COLONOSCOPY WITH PROPOFOL ;  Surgeon: Commander Lupita FORBES, MD;  Location: [facility];  Service: Endoscopy;  Laterality: N/A;   CORNEAL TRANSPLANT  1980 &  1984   '80/Right  '84/Left   EYE SURGERY Right 05/2017   HERNIA REPAIR     HIP SURGERY     bilateral   INCISIONAL HERNIA REPAIR N/A 12/18/2013   Procedure:  REPAIR OF INCARCERATED INCISIONAL HERNIA;  Surgeon: Donnice Bury, MD;  Location: [facility] OR;  Service: General;  Laterality: N/A;   INSERTION OF MESH N/A 12/18/2013   Procedure: INSERTION OF MESH;  Surgeon: Donnice Bury, MD;  Location: [facility] OR;  Service: General;  Laterality: N/A;   Sigmoid Colectomy  10/2005   [facility]   TONSILLECTOMY     Social History:  reports that he has never smoked. He has never used smokeless tobacco. He reports that  he does not currently use alcohol. He reports that he does not use drugs.  Allergies  Allergen Reactions   Shellfish Allergy Anaphylaxis   Lisinopril  Cough   Relistor  [Methylnaltrexone  Bromide] Other (See Comments)    confusion   Lovastatin Itching    Family History  Problem Relation Age of Onset   Hepatitis Mother        hepatitis C   Diabetes Mother    Congestive Heart Failure Mother    CAD Mother    Heart attack Brother 46   Diabetes Father    Heart failure Brother    Stomach cancer Neg Hx    Colon cancer Neg Hx     Prior to Admission medications   Medication Sig Start Date End Date Taking? Authorizing Provider  Albuterol  Sulfate (PROAIR  RESPICLICK) 108 (90 Base) MCG/ACT AEPB Inhale 1 puff into the lungs 4 (four) times daily as needed. Patient taking differently: Inhale 1 puff into the lungs 4 (four) times daily as needed (SOB). 02/05/21  Yes Joshua Debby CROME, MD  allopurinol  (ZYLOPRIM ) 100 MG tablet Take 1 tablet (100 mg total) by mouth daily. 11/30/21  Yes Regalado, Belkys A, MD  aspirin  EC 81 MG tablet Take 1 tablet (81 mg total) by mouth daily. 04/17/20  Yes Joshua Debby CROME, MD  atorvastatin  (LIPITOR) 80 MG tablet Take 1 tablet (80 mg total) by mouth daily. 07/13/23  Yes Rolan Ezra RAMAN, MD  bisoprolol  (ZEBETA ) 10 MG tablet Take 1 tablet (10 mg total) by mouth every evening. 04/19/21  Yes Joshua Debby CROME, MD  colchicine  0.6 MG tablet Take 0.6 mg by mouth daily.   Yes [provider]  empagliflozin  (JARDIANCE ) 10 MG TABS tablet Take 1 tablet (10 mg total) by mouth daily before breakfast. 03/16/23  Yes Rolan Ezra RAMAN, MD  ferrous sulfate  325 (65 FE) MG tablet Take 1 tablet (325 mg total) by mouth 2 (two) times daily with a meal. 10/07/21  Yes Joshua Debby CROME, MD  furosemide  (LASIX ) 20 MG tablet Take 3 tablets (60 mg total) by mouth 2 (two) times daily. 11/13/23  Yes Jillian Buttery, MD  HYDROcodone -acetaminophen  (NORCO) 10-325 MG tablet Take 1 tablet by mouth every 6  (six) hours as needed for severe pain (pain score 7-10). 03/25/22  Yes [provider]  LANTUS SOLOSTAR 100 UNIT/ML Solostar Pen Inject 65 Units into the skin daily. 11/07/23  Yes [provider]  losartan  (COZAAR ) 25 MG tablet Take 25 mg by mouth daily. 04/05/24  Yes [provider]  Magnesium Hydroxide (DULCOLAX PO) Take 1 tablet by mouth daily.   Yes [provider]  metolazone  (ZAROXOLYN ) 2.5 MG tablet TAKE 1 TABLET (2.5 MG TOTAL) BY MOUTH ONCE A WEEK. EVERY TUESDAY 04/11/23  Yes Rolan Ezra RAMAN, MD  Multiple Vitamins-Minerals (MULTIVITAMIN  ADULT PO) Take 1 tablet by mouth daily.   Yes [provider]  potassium chloride  SA (KLOR-CON  M) 20 MEQ tablet TAKE 1 TABLET BY MOUTH ONCE A WEEK. EVERY TUESDAY 04/11/23  Yes Rolan Ezra RAMAN, MD  RESTASIS  0.05 % ophthalmic emulsion PLACE 1 DROP INTO BOTH EYES TWICE A DAY 05/07/21  Yes Joshua Debby CROME, MD  VITAMIN D  PO Take 1 tablet by mouth daily.   Yes [provider]  VITAMIN E PO Take 1 tablet by mouth daily.   Yes [provider]  Accu-Chek Softclix Lancets lancets CHECK BLOOD SUGAR 2 TIMES  DAILY 10/26/21   Joshua Debby CROME, MD  blood glucose meter kit and supplies KIT Inject 1 each into the skin 4 (four) times daily. Use to check blood sugar three times daily. Dispense Freestyle Libre according to insurance preference. DX: E11.8 10/26/21   Joshua Debby CROME, MD  blood glucose meter kit and supplies Dispense based on patient and insurance preference. Use up to four times daily as directed. (FOR ICD-10 E10.9, E11.9). 10/26/21   Joshua Debby CROME, MD  Blood Glucose Monitoring Suppl (ACCU-CHEK AVIVA CONNECT) w/Device KIT 1 Act by Does not apply route 3 (three) times daily as needed. 10/26/21   Joshua Debby CROME, MD  Continuous Blood Gluc Sensor (FREESTYLE LIBRE 2 SENSOR) MISC 1 Act by Does not apply route daily. 07/27/21   Joshua Debby CROME, MD  gabapentin  (NEURONTIN ) 300 MG capsule Take 300 mg by mouth daily as needed  (pain). 07/20/23   [provider]  glucose blood (ACCU-CHEK AVIVA PLUS) test strip Use to test blood sugar twice daily. DX E11.8 10/21/20   Joshua Debby CROME, MD  Insulin  Pen Needle (ULTICARE SHORT PEN NEEDLES) 31G X 8 MM MISC USE TWO PEN NEEDLES DAILY WITH INSULIN  PENS 07/19/21   Joshua Debby CROME, MD    Physical Exam: Vitals:   04/29/24 1030 04/29/24 1045 04/29/24 1100 04/29/24 1430  BP: (!) 151/80 (!) 143/88 132/85 132/86  Pulse: 67 69 71 67  Resp: 12 14 19 15   Temp:      TempSrc:      SpO2: 98% 90% 98% 100%  Weight:      Height:       Physical Exam   General: Alert, oriented X3  Eyes: Pupils equal, reactive  Oral cavity: moist mucous membranes  Head: Atraumatic, normocephalic  Neck: supple  Chest: clear to auscultation. No crackles, no wheezes  CVS: S1,S2 RRR. No murmurs  Abd: Obese, soft, non-tender.  Sluggish bowel sounds Extr: Chronic venous stasis changes MSK: No joint deformities or swelling  Neurological: Grossly intact.    Data Reviewed:     Latest Ref Rng & Units 04/29/2024   10:22 AM 04/29/2024   10:17 AM 11/11/2023    8:09 AM  CBC  WBC 4.0 - 10.5 K/uL  6.0  5.2   Hemoglobin 13.0 - 17.0 g/dL 86.0  87.4  89.3   Hematocrit 39.0 - 52.0 % 41.0  40.8  35.8   Platelets 150 - 400 K/uL  220  158       Latest Ref Rng & Units 04/29/2024   12:49 PM 04/29/2024   10:22 AM 11/13/2023    5:27 AM  BMP  Glucose 70 - 99 mg/dL 773  760  892   BUN 8 - 23 mg/dL 45  69  26   Creatinine 0.61 - 1.24 mg/dL 7.54  7.39  8.39   Sodium 135 - 145 mmol/L 137  133  143   Potassium 3.5 - 5.1 mmol/L 5.1  8.1  3.9   Chloride 98 - 111 mmol/L 97  100  108   CO2 22 - 32 mmol/L 29   25   Calcium  8.9 - 10.3 mg/dL 8.9   8.5      Assessment and Plan:  Small bowel obstruction Patient presented with nausea, vomiting, abdominal pain. History of small bowel obstruction in the past. Known history of supraumbilical hernia and history of bowel surgery. Seen by surgery.  Input  appreciated. - Conservative management for now. - NPO.   -IV fluids (with caution due to history of CHF) - NG tube for decompression. - Maintain potassium >4, phosphorus >3, magnesium >2.  CHF Latest EF was 50 to 55%. Not in acute exacerbation. - Holding home Jardiance , bisoprolol , Lasix , Zaroxolyn , losartan  given n.p.o. status.  CKD stage III-IV Baseline creatinine is 1.6-2.2. Patient presented with creatinine of 2.6. Likely prerenal in the setting of vomiting and poor p.o. intake. - Cautious rehydration with IV fluids. - Avoid nephrotoxins.  T2DM Patient is on Lantus insulin  at home. - Holding Lantus insulin  as patient is NPO. - Sliding scale insulin .  HTN - Holding home medications as above.  Hyperlipidemia - Holding home atorvastatin  given NPO.  Class III obesity - Outpatient management.   Advance Care Planning:   Code Status: Full Code   Consults: General Surgery  Family Communication: N/A  Severity of Illness: The appropriate patient status for this patient is INPATIENT. Inpatient status is judged to be reasonable and necessary in order to provide the required intensity of service to ensure the patient's safety. The patient's presenting symptoms, physical exam findings, and initial radiographic and laboratory data in the context of their chronic comorbidities is felt to place them at high risk for further clinical deterioration. Furthermore, it is not anticipated that the patient will be medically stable for discharge from the hospital within 2 midnights of admission.   * I certify that at the point of admission it is my clinical judgment that the patient will require inpatient hospital care spanning beyond 2 midnights from the point of admission due to high intensity of service, high risk for further deterioration and high frequency of surveillance required.*  Author: MDALA-GAUSI, Ninoshka Wainwright AGATHA, MD 04/29/2024 5:13 PM  For on call review [email or web address].

## 2024-04-29 NOTE — ED Triage Notes (Signed)
 Pt to ED via EMS with c/o hematemesis since Saturday. Pt states emesis is brown. Pt constipated x1 week. Pt took laxatives Friday night. Pt also c/o generalized abdominal pain and SOB. Hx hernia.

## 2024-04-30 ENCOUNTER — Inpatient Hospital Stay (HOSPITAL_COMMUNITY)

## 2024-04-30 DIAGNOSIS — K56609 Unspecified intestinal obstruction, unspecified as to partial versus complete obstruction: Secondary | ICD-10-CM | POA: Diagnosis not present

## 2024-04-30 LAB — CBC
HCT: 38.9 % — ABNORMAL LOW (ref 39.0–52.0)
Hemoglobin: 11.6 g/dL — ABNORMAL LOW (ref 13.0–17.0)
MCH: 27.2 pg (ref 26.0–34.0)
MCHC: 29.8 g/dL — ABNORMAL LOW (ref 30.0–36.0)
MCV: 91.1 fL (ref 80.0–100.0)
Platelets: 196 K/uL (ref 150–400)
RBC: 4.27 MIL/uL (ref 4.22–5.81)
RDW: 15.6 % — ABNORMAL HIGH (ref 11.5–15.5)
WBC: 5 K/uL (ref 4.0–10.5)
nRBC: 0 % (ref 0.0–0.2)

## 2024-04-30 LAB — BASIC METABOLIC PANEL WITH GFR
Anion gap: 10 (ref 5–15)
BUN: 43 mg/dL — ABNORMAL HIGH (ref 8–23)
CO2: 31 mmol/L (ref 22–32)
Calcium: 8.4 mg/dL — ABNORMAL LOW (ref 8.9–10.3)
Chloride: 100 mmol/L (ref 98–111)
Creatinine, Ser: 2.39 mg/dL — ABNORMAL HIGH (ref 0.61–1.24)
GFR, Estimated: 29 mL/min — ABNORMAL LOW (ref >60)
Glucose, Bld: 145 mg/dL — ABNORMAL HIGH (ref 70–99)
Potassium: 4.8 mmol/L (ref 3.5–5.1)
Sodium: 141 mmol/L (ref 135–145)

## 2024-04-30 LAB — GLUCOSE, CAPILLARY
Glucose-Capillary: 153 mg/dL — ABNORMAL HIGH (ref 70–99)
Glucose-Capillary: 143 mg/dL — ABNORMAL HIGH (ref 70–99)
Glucose-Capillary: 125 mg/dL — ABNORMAL HIGH (ref 70–99)
Glucose-Capillary: 118 mg/dL — ABNORMAL HIGH (ref 70–99)
Glucose-Capillary: 110 mg/dL — ABNORMAL HIGH (ref 70–99)
Glucose-Capillary: 102 mg/dL — ABNORMAL HIGH (ref 70–99)

## 2024-04-30 LAB — PHOSPHORUS: Phosphorus: 4.8 mg/dL — ABNORMAL HIGH (ref 2.5–4.6)

## 2024-04-30 LAB — MAGNESIUM: Magnesium: 2.2 mg/dL (ref 1.7–2.4)

## 2024-04-30 NOTE — Progress Notes (Signed)
 Progress Note   Patient: [name] FMW:[id number] DOB: Sep 07, 1956 DOA: 04/29/2024     1 DOS: the patient was seen and examined on 04/30/2024   Brief hospital course:  68 y.o. man with PMH of T2DM, CHF, gout, HLD, class III obesity, history of colon cancer s/p resection, supraumbilical hernia, chronic constipation who presented to the ED with pain, vomiting for 3 days.  Diagnosed with small bowel obstruction.  Surgery consulted.   Assessment and Plan:  Small bowel obstruction Patient presented with nausea, vomiting, abdominal pain. History of small bowel obstruction in the past. Known history of supraumbilical hernia and history of bowel surgery. Seen by surgery.  Input appreciated. - Conservative management for now. - NPO.   -IV fluids (with caution due to history of CHF) - NG tube for decompression to be reattempted. - Maintain potassium >4, phosphorus >3, magnesium >2. - Per surgical team, if patient fails to respond to conservative treatment, will need exploratory surgery.  CHF Latest EF was 50 to 55%. Not in acute exacerbation. - Holding home Jardiance , bisoprolol , Lasix , Zaroxolyn , losartan  given n.p.o. status.   CKD stage III-IV Baseline creatinine is 1.6-2.2. Patient presented with creatinine of 2.6. Likely prerenal in the setting of vomiting and poor p.o. intake. AKI resolving with IV fluids - Continue cautious rehydration with IV fluids. - Avoid nephrotoxins.   T2DM Patient is on Lantus insulin  at home. - Holding Lantus insulin  as patient is NPO. - Sliding scale insulin .   HTN - Holding home medications as above.   Hyperlipidemia - Holding home atorvastatin  given NPO.   Class III obesity - Outpatient management.       Subjective: Staff were unable to place NG tube yesterday.  It will be reattempted today.  Patient has ongoing abdominal discomfort.  He denies shortness of breath.  Physical Exam: Vitals:   04/29/24 1445 04/29/24 2008 04/30/24 0434  04/30/24 0911  BP: (!) 155/87 (!) 120/52 (!) 104/58 (!) 140/116  Pulse: 70 69 74 79  Resp: 14     Temp:  98.9 F (37.2 C) 97.9 F (36.6 C) 98.2 F (36.8 C)  TempSrc:  Oral Oral   SpO2: 100% 95% (!) 87% 94%  Weight:      Height:       Physical Exam   General: Alert, oriented X3  Eyes: Pupils equal, reactive  Oral cavity: moist mucous membranes  Head: Atraumatic, normocephalic  Neck: supple  Chest: clear to auscultation. No crackles, no wheezes  CVS: S1,S2 RRR. No murmurs  Abd: Distended, non-tender. No masses palpable. Bowel sounds + Extr: No edema   MSK: No joint deformities or swelling  Neurological: Grossly intact.    Data Reviewed:      Latest Ref Rng & Units 04/30/2024    4:54 AM 04/29/2024   10:22 AM 04/29/2024   10:17 AM  CBC  WBC 4.0 - 10.5 K/uL 5.0   6.0   Hemoglobin 13.0 - 17.0 g/dL 88.3  86.0  87.4   Hematocrit 39.0 - 52.0 % 38.9  41.0  40.8   Platelets 150 - 400 K/uL 196   220       Latest Ref Rng & Units 04/30/2024    4:54 AM 04/29/2024   12:49 PM 04/29/2024   10:22 AM  BMP  Glucose 70 - 99 mg/dL 854  773  760   BUN 8 - 23 mg/dL 43  45  69   Creatinine 0.61 - 1.24 mg/dL 7.60  7.54  7.39   Sodium  135 - 145 mmol/L 141  137  133   Potassium 3.5 - 5.1 mmol/L 4.8  5.1  8.1   Chloride 98 - 111 mmol/L 100  97  100   CO2 22 - 32 mmol/L 31  29    Calcium  8.9 - 10.3 mg/dL 8.4  8.9       Family Communication: n/a  Disposition: Status is: Inpatient Remains inpatient appropriate because: ongoing NPO status due to small bowel obstruction, needing IV fluids  Planned Discharge Destination: Home    Time spent: 35 minutes  Author: MDALA-GAUSI, Ruari Duggan AGATHA, MD 04/30/2024 12:32 PM  For on call review [email or web address].

## 2024-04-30 NOTE — Plan of Care (Signed)
  Problem: Skin Integrity: Goal: Risk for impaired skin integrity will decrease 04/30/2024 0554 by Rosaura Marelyn POUR, RN Outcome: Progressing 04/30/2024 0553 by Rosaura Marelyn POUR, RN Outcome: Progressing

## 2024-04-30 NOTE — Progress Notes (Signed)
 This nurse tried to insert NG tube.Aborted due to resistance

## 2024-04-30 NOTE — TOC Initial Note (Signed)
 Transition of Care [facility]) - Initial/Assessment Note    Patient Details  Name: [name] MRN: [id number] Date of Birth: 05/08/1956  [facility] CM/SW Contact:    Lendia Dais, LCSWA Phone Number: 04/30/2024, 12:26 PM  Clinical Narrative:  Pt is from home alone and receives house keeping services from [facility]. Pt has a rollator and an Mining engineer wheelchair.  CSW received permission to contact emergency contacts in the chart. Pt stated that he rather his Juliene Barrio be contacted versus his sister Diane but she can remain as an alternate contact.  CSW will continue to follow for any dc needs.   Expected Discharge Plan: Home/Self Care Barriers to Discharge: Continued Medical Work up   Patient Goals and CMS Choice Patient states their goals for this hospitalization and ongoing recovery are:: To go home and eat better   Choice offered to / list presented to : NA      Expected Discharge Plan and Services In-house Referral: Clinical Social Work     Living arrangements for the past 2 months: Single Family Home                                      Prior Living Arrangements/Services Living arrangements for the past 2 months: Single Family Home Lives with:: Self   Do you feel safe going back to the place where you live?: Yes      Need for Family Participation in Patient Care: Yes (Comment) Care giver support system in place?: Yes (comment) Current home services: DME, Housekeeping [facility] [employer], rollator, and Mining engineer wheelchair.) Criminal Activity/Legal Involvement Pertinent to Current Situation/Hospitalization: No - Comment as needed  Activities of Daily Living   ADL Screening (condition at time of admission) Independently performs ADLs?: Yes (appropriate for developmental age) Is the patient deaf or have difficulty hearing?: No Does the patient have difficulty seeing, even when wearing glasses/contacts?: No Does the  patient have difficulty concentrating, remembering, or making decisions?: No  Permission Sought/Granted Permission sought to share information with : Family Supports, Other (comment) Field seismologist) Permission granted to share information with : Yes, Verbal Permission Granted  Share Information with NAME: Barrio Ill (pastor)           Emotional Assessment Appearance:: Appears stated age Attitude/Demeanor/Rapport: Engaged Affect (typically observed): Appropriate, Pleasant Orientation: : Oriented to Self, Oriented to Place, Oriented to  Time, Oriented to Situation Alcohol / Substance Use: Not Applicable Psych Involvement: No (comment)  Admission diagnosis:  Small bowel obstruction (HCC) [K56.609] SBO (small bowel obstruction) (HCC) [K56.609] Patient Active Problem List   Diagnosis Date Noted   SBO (small bowel obstruction) (HCC) 11/10/2023   CKD (chronic kidney disease) 11/10/2023   Bronchiectasis (HCC) 11/10/2023   Gout attack 11/27/2021   Hypocalcemia 11/25/2021   Acute metabolic encephalopathy 11/25/2021   Acute respiratory failure with hypoxia and hypercapnia (HCC) 11/25/2021   Stage 3b chronic kidney disease (CKD) (HCC) 11/25/2021   Syncope 11/25/2021   Hypoglycemia 11/24/2021   Fall (on)(from) sidewalk curb, initial encounter 11/24/2021   Chronic respiratory failure with hypoxia (HCC) 11/24/2021   Hyperkalemia 09/28/2021   Dependence on supplemental oxygen  09/19/2021   Long term (current) use of insulin  (HCC) 09/19/2021   Opioid abuse, uncomplicated (HCC) 09/19/2021   Type 2 diabetes mellitus with diabetic peripheral angiopathy without gangrene (HCC) 09/19/2021   Insulin -requiring or dependent type II diabetes mellitus (HCC) 07/27/2021   Encounter  for general adult medical examination with abnormal findings 04/06/2021   Calculus of gallbladder without cholecystitis without obstruction 04/06/2021   Chronic idiopathic constipation 11/09/2020   PAD (peripheral artery disease)  (HCC) 07/24/2020   Ulcer of left foot, limited to breakdown of skin (HCC) 07/23/2020   Drug-induced erectile dysfunction 05/22/2019   Onychomycosis of toenail 05/21/2019   Class 3 obesity with alveolar hypoventilation, serious comorbidity, and body mass index (BMI) of 50.0 to 59.9 in adult (HCC) 02/07/2019   Ventral hernia without obstruction or gangrene 05/31/2018   Therapeutic opioid-induced constipation (OIC) 05/31/2018   Long-term current use of opiate analgesic 02/15/2018   Left lumbar radiculopathy 01/29/2018   B12 deficiency 10/17/2017   Bleeding internal hemorrhoids    Insomnia 06/30/2017   Left eye affected by proliferative diabetic retinopathy with traction retinal detachment not involving macula, associated with type 2 diabetes mellitus (HCC) 04/20/2017   Right eye affected by proliferative diabetic retinopathy with traction retinal detachment involving macula, associated with type 2 diabetes mellitus (HCC) 04/20/2017   Vitamin D  deficiency 12/23/2016   Thiamine  deficiency 08/02/2016   Primary osteoarthritis involving multiple joints 08/03/2015   Diabetes mellitus type II, uncontrolled 05/23/2014   Herpesviral keratitis 05/23/2014   Diabetes mellitus due to underlying condition with both eyes affected by proliferative retinopathy without macular edema, with long-term current use of insulin  (HCC) 05/23/2014   Diabetic foot ulcer 02/05/2014   Iron deficiency anemia 01/09/2014   Osteoarthritis of hip 05/08/2013   Diabetic nephropathy (HCC) 05/07/2013   Bariatric surgery status 01/29/2013   Benign prostatic hyperplasia without lower urinary tract symptoms 01/11/2013   Hypothyroidism 01/11/2013   Colon cancer (HCC) 10/04/2012   Chronic diastolic CHF (congestive heart failure) (HCC) 04/13/2012   Diabetic peripheral neuropathy (HCC) 05/29/2008   OSA (obstructive sleep apnea) 10/01/2007   CARDIOMYOPATHY 10/01/2007   Hyperlipidemia with target LDL less than 100 07/24/2007    Essential hypertension 07/24/2007   PCP:  Delores Rojelio Caldron, NP Pharmacy:   [facility] - [place], [place] - [address] [place] [address] Phone: [phone] Fax: [phone]     Social Drivers of Health (SDOH) Social History: SDOH Screenings   Food Insecurity: No Food Insecurity (04/29/2024)  Housing: Low Risk  (04/29/2024)  Transportation Needs: No Transportation Needs (04/29/2024)  Utilities: Not At Risk (04/29/2024)  Alcohol Screen: Low Risk  (11/06/2020)  Depression (PHQ2-9): Low Risk  (11/06/2020)  Financial Resource Strain: Low Risk  (08/10/2021)  Physical Activity: Inactive (11/06/2020)  Social Connections: Socially Isolated (04/29/2024)  Stress: Stress Concern Present (11/07/2019)  Tobacco Use: Low Risk  (04/29/2024)   SDOH Interventions:     Readmission Risk Interventions    11/11/2023    1:37 PM  Readmission Risk Prevention Plan  Transportation Screening Complete  Medication Review (RN Care Manager) Complete  PCP or Specialist appointment within 3-5 days of discharge Complete  HRI or Home Care Consult Complete  SW Recovery Care/Counseling Consult Complete  Palliative Care Screening Not Applicable  Skilled Nursing Facility Not Applicable

## 2024-04-30 NOTE — Progress Notes (Signed)
 Progress Note: General Surgery Service   Chief Complaint/Subjective: No NG so abdomen still hurts.  No nausea or vomiting.  Chronic epigastric hernia starting to hurt some this morning  Objective: Vital signs in last 24 hours: Temp:  [97.9 F (36.6 C)-98.9 F (37.2 C)] 98.2 F (36.8 C) (08/12 0911) Pulse Rate:  [67-79] 79 (08/12 0911) Resp:  [12-26] 14 (08/11 1445) BP: (104-155)/(52-116) 140/116 (08/12 0911) SpO2:  [87 %-100 %] 94 % (08/12 0911) Weight:  [181 kg] 181 kg (08/11 1002) Last BM Date : 04/25/24  Intake/Output from previous day: 08/11 0701 - 08/12 0700 In: 496.2 [I.V.:496.2] Out: 300 [Urine:300] Intake/Output this shift: No intake/output data recorded.  GI: Abd epigastric hernia soft, mild tenderness, abdominal bloating  Lab Results: CBC  Recent Labs    04/29/24 1017 04/29/24 1022 04/30/24 0454  WBC 6.0  --  5.0  HGB 12.5* 13.9 11.6*  HCT 40.8 41.0 38.9*  PLT 220  --  196   BMET Recent Labs    04/29/24 1249 04/30/24 0454  NA 137 141  K 5.1 4.8  CL 97* 100  CO2 29 31  GLUCOSE 226* 145*  BUN 45* 43*  CREATININE 2.45* 2.39*  CALCIUM  8.9 8.4*   PT/INR No results for input(s): LABPROT, INR in the last 72 hours. ABG No results for input(s): PHART, HCO3 in the last 72 hours.  Invalid input(s): PCO2, PO2  Anti-infectives: Anti-infectives (From admission, onward)    None       Medications: Scheduled Meds:  diatrizoate  meglumine -sodium  90 mL Per NG tube Once   enoxaparin  (LOVENOX ) injection  90 mg Subcutaneous Q24H   insulin  aspart  0-9 Units Subcutaneous Q6H   midazolam   2 mg Intravenous Once   Continuous Infusions:  lactated ringers  75 mL/hr at 04/29/24 2120   PRN Meds:.morphine  injection **OR** morphine  injection  Assessment/Plan: Mr. Luedke is a 68 year old male with recurrent SBOs  Obstruction appears remote from hernia as it has previously Obstructions have improved previously with NG tube Will attempt to place  NG tube but if unable and symptoms fail to respond to conservative treatment, the patient will need exploratory surgery.    LOS: 1 day    Deward JINNY Foy, MD  [facility] Use [email or web address] to contact on call provider  Daily Billing: [phone] - High MDM

## 2024-05-01 DIAGNOSIS — K56609 Unspecified intestinal obstruction, unspecified as to partial versus complete obstruction: Secondary | ICD-10-CM | POA: Diagnosis not present

## 2024-05-01 LAB — BASIC METABOLIC PANEL WITH GFR
Anion gap: 10 (ref 5–15)
BUN: 37 mg/dL — ABNORMAL HIGH (ref 8–23)
CO2: 29 mmol/L (ref 22–32)
Calcium: 8.6 mg/dL — ABNORMAL LOW (ref 8.9–10.3)
Chloride: 105 mmol/L (ref 98–111)
Creatinine, Ser: 2.09 mg/dL — ABNORMAL HIGH (ref 0.61–1.24)
GFR, Estimated: 34 mL/min — ABNORMAL LOW (ref >60)
Glucose, Bld: 133 mg/dL — ABNORMAL HIGH (ref 70–99)
Potassium: 4.8 mmol/L (ref 3.5–5.1)
Sodium: 144 mmol/L (ref 135–145)

## 2024-05-01 LAB — CBC
HCT: 37.1 % — ABNORMAL LOW (ref 39.0–52.0)
Hemoglobin: 11.1 g/dL — ABNORMAL LOW (ref 13.0–17.0)
MCH: 27.5 pg (ref 26.0–34.0)
MCHC: 29.9 g/dL — ABNORMAL LOW (ref 30.0–36.0)
MCV: 91.8 fL (ref 80.0–100.0)
Platelets: 174 K/uL (ref 150–400)
RBC: 4.04 MIL/uL — ABNORMAL LOW (ref 4.22–5.81)
RDW: 15.4 % (ref 11.5–15.5)
WBC: 5.5 K/uL (ref 4.0–10.5)
nRBC: 0 % (ref 0.0–0.2)

## 2024-05-01 LAB — MAGNESIUM: Magnesium: 2.2 mg/dL (ref 1.7–2.4)

## 2024-05-01 LAB — GLUCOSE, CAPILLARY
Glucose-Capillary: 117 mg/dL — ABNORMAL HIGH (ref 70–99)
Glucose-Capillary: 121 mg/dL — ABNORMAL HIGH (ref 70–99)
Glucose-Capillary: 126 mg/dL — ABNORMAL HIGH (ref 70–99)
Glucose-Capillary: 133 mg/dL — ABNORMAL HIGH (ref 70–99)

## 2024-05-01 LAB — PHOSPHORUS: Phosphorus: 4.1 mg/dL (ref 2.5–4.6)

## 2024-05-01 MED ORDER — ACETAMINOPHEN 10 MG/ML IV SOLN
1000.0000 mg | Freq: Three times a day (TID) | INTRAVENOUS | Status: AC | PRN
  Administered 2024-05-01 – 2024-05-02 (×3): 1000 mg via INTRAVENOUS
  Filled 2024-05-01 (×2): qty 100

## 2024-05-01 MED ORDER — SODIUM CHLORIDE 0.9 % IV SOLN: INTRAVENOUS | Status: DC

## 2024-05-01 MED ORDER — INSULIN ASPART 100 UNIT/ML IJ SOLN
0.0000 [IU] | Freq: Four times a day (QID) | INTRAMUSCULAR | Status: DC
  Administered 2024-05-01 (×4): 1 [IU] via SUBCUTANEOUS
  Administered 2024-05-02: 2 [IU] via SUBCUTANEOUS
  Administered 2024-05-02: 1 [IU] via SUBCUTANEOUS
  Administered 2024-05-03 (×2): 2 [IU] via SUBCUTANEOUS
  Administered 2024-05-03: 3 [IU] via SUBCUTANEOUS
  Administered 2024-05-03: 2 [IU] via SUBCUTANEOUS
  Administered 2024-05-03: 3 [IU] via SUBCUTANEOUS
  Administered 2024-05-04: 2 [IU] via SUBCUTANEOUS

## 2024-05-01 MED ORDER — ACETAMINOPHEN 325 MG PO TABS: 650.0000 mg | ORAL_TABLET | Freq: Four times a day (QID) | ORAL | Status: DC | PRN

## 2024-05-01 NOTE — Progress Notes (Addendum)
 [facility] PROGRESS NOTE    Progress Note  [name]  FMW:[id number] DOB: 11/25/1955 DOA: 04/29/2024 PCP: Delores Rojelio Caldron, NP     Brief Narrative:   [name] is an 68 y.o. male past medical history of diabetes mellitus type 2, chronic diastolic dysfunction, diabetes mellitus type 2 history of colon cancer status post resection chronic constipation comes into the ED with vomiting that started 3 days prior to admission was found to have a small bowel obstruction General Surgery was consulted.   Assessment/Plan:   SBO (small bowel obstruction) (HCC) Patient with a history of small bowel obstruction in the past and known supraumbilical hernia. Surgery was consulted and recommended conservative management. NG tube was placed.  Yesterday in the afternoon with large amounts of fluids and symptoms have improved.  Negative about 1.3 L. Try to keep potassium greater than 4 magnesium greater than 2. Further management per surgery. Patient not able to ambulate.  Chronic diastolic dysfunction: Holding Jardiance  Lasix  ARB and Zaroxolyn  given n.p.o. status. We are also holding bisoprolol .   Acute kidney injury on chronic kidney disease stage IIIb: With a baseline creatinine 1.6-2.2. Admission 2.6. Likely hemodynamically mediated. Started on IV fluids his creatinine has returned to baseline.  Diabetes mellitus type 2: Blood glucose well-controlled on sliding scale continue current regimen.  Essential hypertension: Holding all antihypertensive medication.  Hyperlipidemia: Currently NPO.  Morbid obesity: Noted.  Diabetic peripheral neuropathy (HCC) Noted.  DVT prophylaxis: lovenox  Family Communication:none Status is: Inpatient Remains inpatient appropriate because: Small bowel obstruction    Code Status:     Code Status Orders  (From admission, onward)           Start     Ordered   04/29/24 1711  Full code  Continuous       Question:  By:  Answer:   Consent: discussion documented in EHR   04/29/24 1712           Code Status History     Date Active Date Inactive Code Status Order ID Comments User Context   11/10/2023 1730 11/13/2023 1835 Full Code [id number]  Moody Alto, MD ED   11/25/2021 0012 12/01/2021 1546 DNR [id number]  Seena Marsa NOVAK, MD ED   11/24/2021 2350 11/25/2021 0012 Full Code [id number]  Seena Marsa NOVAK, MD ED   09/28/2021 2233 09/29/2021 1929 DNR [id number]  Alfornia Madison, MD ED   09/28/2021 2136 09/28/2021 2233 Full Code [id number]  Alfornia Madison, MD ED   12/18/2013 1754 12/24/2013 1649 Full Code [id number]  Ebbie Cough, MD Inpatient   12/17/2013 2359 12/18/2013 1754 Full Code [id number]  Tammy Sor, PA-C Inpatient      Advance Directive Documentation    Flowsheet Row Most Recent Value  Type of Advance Directive Healthcare Power of Attorney  Pre-existing out of facility DNR order (yellow form or pink MOST form) --  MOST Form in Place? --      IV Access:   Peripheral IV   Procedures and diagnostic studies:   DG Abd Portable 1V Result Date: 04/30/2024 CLINICAL DATA:  NG tube placement EXAM: PORTABLE ABDOMEN - 1 VIEW COMPARISON:  11/12/2023, CT 04/29/2024 FINDINGS: Enteric tube tip overlies the stomach, possible kink at the distal tube. Mild air distension of the upper bowel. IMPRESSION: Enteric tube tip overlies the stomach, possible kink at the distal tube. Electronically Signed   By: Luke Bun M.D.   On: 04/30/2024 16:42   CT ABDOMEN PELVIS WO CONTRAST Result Date: 04/29/2024 CLINICAL DATA:  Abdominal  pain, acute, nonlocalized Bowel obstruction suspected EXAM: CT ABDOMEN AND PELVIS WITHOUT CONTRAST TECHNIQUE: Multidetector CT imaging of the abdomen and pelvis was performed following the standard protocol without IV contrast. RADIATION DOSE REDUCTION: This exam was performed according to the departmental dose-optimization program which includes automated exposure control, adjustment of the mA  and/or kV according to patient size and/or use of iterative reconstruction technique. COMPARISON:  Abdominopelvic CT 11/10/2023 and 12/01/2022. FINDINGS: Lower chest: Mild atelectasis at both lung bases. No confluent airspace disease, pleural or pericardial effusion. Aortic and coronary artery atherosclerosis noted. Hepatobiliary: No focal hepatic abnormalities are identified on noncontrast imaging. A calcified gallstone is again noted. No evidence of gallbladder wall thickening, surrounding inflammation or biliary ductal dilatation. Pancreas: Generalized atrophy. No ductal dilatation or surrounding inflammation. Spleen: Normal in size without focal abnormality. Adrenals/Urinary Tract: Both adrenal glands appear normal. Bilateral renal cortical thinning. No evidence of urinary tract calculus, hydronephrosis or perinephric soft tissue stranding. The bladder appears unremarkable for its degree of distention. Stomach/Bowel: No enteric contrast administered. Laparoscopic gastric band remains in place. There is increased distention of a fluid-filled stomach and mildly increased dilatation of fluid-filled proximal to mid small bowel. Again demonstrated is abrupt transition in small bowel caliber deep to the midline incision, suspicious for recurrent small bowel obstruction secondary to adhesions. The distal small bowel is decompressed. The appendix is not as well seen currently, although demonstrates no acute abnormality. The colon is decompressed and contains a moderate amount of stool. There is a stable supraumbilical hernia containing a portion of the transverse colon. Vascular/Lymphatic: There are no enlarged abdominal or pelvic lymph nodes. Aortic and branch vessel atherosclerosis. No evidence of aneurysm. Reproductive: The prostate gland and seminal vesicles appear unremarkable. Other: As above, postsurgical changes in the anterior abdominal wall with a stable supraumbilical hernia containing a portion of the  transverse colon. No ascites, focal extraluminal fluid collection or pneumoperitoneum. Musculoskeletal: No acute or significant osseous findings. Postsurgical changes from previous bilateral femoral neck pinning. Chronic collapse of the right femoral head with severe asymmetric right hip degenerative changes. Mild multilevel spondylosis. IMPRESSION: 1. Findings are consistent with recurrent mid small bowel obstruction with transition point deep to the midline incision, likely secondary to adhesions. 2. No evidence of bowel perforation or abscess. 3. Stable supraumbilical hernia containing a portion of the transverse colon. 4. Cholelithiasis without evidence of cholecystitis or biliary ductal dilatation. 5.  Aortic Atherosclerosis (ICD10-I70.0). Electronically Signed   By: Elsie Perone M.D.   On: 04/29/2024 14:47   DG Chest Portable 1 View Result Date: 04/29/2024 EXAM: 1 VIEW XRAY OF THE CHEST 04/29/2024 10:31:31 AM COMPARISON: 11/10/2023 CLINICAL HISTORY: Chest pain. Reason for exam: chest pain and SOB FINDINGS: LUNGS AND PLEURA: No focal pulmonary opacity. No pulmonary edema. No pleural effusion. No pneumothorax. HEART AND MEDIASTINUM: No acute abnormality of the cardiac and mediastinal silhouettes. BONES AND SOFT TISSUES: No acute osseous abnormality. IMPRESSION: 1. No acute cardiopulmonary pathology. Electronically signed by: evalene coho 04/29/2024 11:16 AM EDT RP Workstation: [id number]     Medical Consultants:   None.   Subjective:    [name] rates his pain is about the same has not passed gas has not had a bowel movement.  Objective:    Vitals:   04/30/24 1628 04/30/24 1953 05/01/24 0512 05/01/24 0749  BP: (!) 127/54 (!) 125/56 (!) 143/70 (!) 140/67  Pulse: 82 69 79 72  Resp: 18 18 18 18   Temp: 98.1 F (36.7 C) 98.6 F (37 C) 97.9  F (36.6 C) 98.4 F (36.9 C)  TempSrc:  Oral  Oral  SpO2: 94% 100% 97% 100%  Weight:      Height:       SpO2: 100 % O2 Flow Rate  (L/min): 2 L/min   Intake/Output Summary (Last 24 hours) at 05/01/2024 0939 Last data filed at 05/01/2024 0600 Gross per 24 hour  Intake 1415.5 ml  Output 2900 ml  Net -1484.5 ml   Filed Weights   04/29/24 1002  Weight: (!) 181 kg    Exam: General exam: In no acute distress. Respiratory system: Good air movement and clear to auscultation. Cardiovascular system: S1 & S2 heard, RRR. No JVD. Gastrointestinal system: Abdomen is nondistended, soft and nontender.  Extremities: No pedal edema. Skin: No rashes, lesions or ulcers Psychiatry: Judgement and insight appear normal. Mood & affect appropriate.    Data Reviewed:    Labs: Basic Metabolic Panel: Recent Labs  Lab 04/29/24 1022 04/29/24 1249 04/30/24 0454 05/01/24 0452  NA 133* 137 141 144  K 8.1* 5.1 4.8 4.8  CL 100 97* 100 105  CO2  --  29 31 29   GLUCOSE 239* 226* 145* 133*  BUN 69* 45* 43* 37*  CREATININE 2.60* 2.45* 2.39* 2.09*  CALCIUM   --  8.9 8.4* 8.6*  MG  --   --  2.2 2.2  PHOS  --   --  4.8* 4.1   GFR Estimated Creatinine Clearance: 59.2 mL/min (A) (by C-G formula based on SCr of 2.09 mg/dL (H)). Liver Function Tests: Recent Labs  Lab 04/29/24 1249  AST 24  ALT 20  ALKPHOS 78  BILITOT 0.7  PROT 7.6  ALBUMIN 3.4*   Recent Labs  Lab 04/29/24 1249  LIPASE 74*   No results for input(s): AMMONIA in the last 168 hours. Coagulation profile No results for input(s): INR, PROTIME in the last 168 hours. COVID-19 Labs  No results for input(s): DDIMER, FERRITIN, LDH, CRP in the last 72 hours.  Lab Results  Component Value Date   SARSCOV2NAA NEGATIVE 11/24/2021   SARSCOV2NAA NEGATIVE 09/28/2021    CBC: Recent Labs  Lab 04/29/24 1017 04/29/24 1022 04/30/24 0454 05/01/24 0452  WBC 6.0  --  5.0 5.5  NEUTROABS 4.5  --   --   --   HGB 12.5* 13.9 11.6* 11.1*  HCT 40.8 41.0 38.9* 37.1*  MCV 89.5  --  91.1 91.8  PLT 220  --  196 174   Cardiac Enzymes: No results for input(s):  CKTOTAL, CKMB, CKMBINDEX, TROPONINI in the last 168 hours. BNP (last 3 results) No results for input(s): PROBNP in the last 8760 hours. CBG: Recent Labs  Lab 04/30/24 0732 04/30/24 1141 04/30/24 1626 04/30/24 2052 05/01/24 0748  GLUCAP 125* 118* 110* 102* 121*   D-Dimer: No results for input(s): DDIMER in the last 72 hours. Hgb A1c: No results for input(s): HGBA1C in the last 72 hours. Lipid Profile: No results for input(s): CHOL, HDL, LDLCALC, TRIG, CHOLHDL, LDLDIRECT in the last 72 hours. Thyroid  function studies: No results for input(s): TSH, T4TOTAL, T3FREE, THYROIDAB in the last 72 hours.  Invalid input(s): FREET3 Anemia work up: No results for input(s): VITAMINB12, FOLATE, FERRITIN, TIBC, IRON, RETICCTPCT in the last 72 hours. Sepsis Labs: Recent Labs  Lab 04/29/24 1017 04/30/24 0454 05/01/24 0452  WBC 6.0 5.0 5.5   Microbiology No results found for this or any previous visit (from the past 240 hours).   Medications:    diatrizoate  meglumine -sodium  90 mL Per  NG tube Once   enoxaparin  (LOVENOX ) injection  90 mg Subcutaneous Q24H   insulin  aspart  0-9 Units Subcutaneous Q6H   midazolam   2 mg Intravenous Once   Continuous Infusions:    LOS: 2 days   Erle Odell Castor  [facility]  05/01/2024, 9:39 AM

## 2024-05-01 NOTE — Progress Notes (Signed)
 Progress Note: General Surgery Service   Chief Complaint/Subjective: NG placed yesterday with some relief of abdominal pain  Objective: Vital signs in last 24 hours: Temp:  [97.9 F (36.6 C)-98.6 F (37 C)] 98.4 F (36.9 C) (08/13 0749) Pulse Rate:  [69-82] 72 (08/13 0749) Resp:  [18] 18 (08/13 0749) BP: (125-143)/(54-116) 140/67 (08/13 0749) SpO2:  [94 %-100 %] 100 % (08/13 0749) Last BM Date : 04/25/24  Intake/Output from previous day: 08/12 0701 - 08/13 0700 In: 1415.5 [I.V.:1415.5] Out: 2900 [Urine:1600; Emesis/NG output:1300] Intake/Output this shift: No intake/output data recorded.  GI: Abd epigastric hernia soft, mild tenderness, abdominal bloating  Lab Results: CBC  Recent Labs    04/30/24 0454 05/01/24 0452  WBC 5.0 5.5  HGB 11.6* 11.1*  HCT 38.9* 37.1*  PLT 196 174   BMET Recent Labs    04/30/24 0454 05/01/24 0452  NA 141 144  K 4.8 4.8  CL 100 105  CO2 31 29  GLUCOSE 145* 133*  BUN 43* 37*  CREATININE 2.39* 2.09*  CALCIUM  8.4* 8.6*   PT/INR No results for input(s): LABPROT, INR in the last 72 hours. ABG No results for input(s): PHART, HCO3 in the last 72 hours.  Invalid input(s): PCO2, PO2  Anti-infectives: Anti-infectives (From admission, onward)    None       Medications: Scheduled Meds:  diatrizoate  meglumine -sodium  90 mL Per NG tube Once   enoxaparin  (LOVENOX ) injection  90 mg Subcutaneous Q24H   insulin  aspart  0-9 Units Subcutaneous Q6H   midazolam   2 mg Intravenous Once   Continuous Infusions:   PRN Meds:.morphine  injection **OR** morphine  injection  Assessment/Plan: [name] is a 68 year old male with recurrent SBOs  Obstruction appears remote from hernia as it has previously Obstructions have improved previously with NG tube NG finally able to be placed yesterday afternoon with large amount decompressed and some improvement in symptoms. If symptoms fail to respond to conservative treatment, the  patient will need exploratory surgery.    LOS: 2 days    [name] JINNY Foy, MD  [facility] Use [email or web address] to contact on call provider  Daily Billing: [phone] - High MDM

## 2024-05-02 DIAGNOSIS — K56609 Unspecified intestinal obstruction, unspecified as to partial versus complete obstruction: Secondary | ICD-10-CM | POA: Diagnosis not present

## 2024-05-02 LAB — CBC
HCT: 36.5 % — ABNORMAL LOW (ref 39.0–52.0)
Hemoglobin: 11 g/dL — ABNORMAL LOW (ref 13.0–17.0)
MCH: 27.5 pg (ref 26.0–34.0)
MCHC: 30.1 g/dL (ref 30.0–36.0)
MCV: 91.3 fL (ref 80.0–100.0)
Platelets: 178 K/uL (ref 150–400)
RBC: 4 MIL/uL — ABNORMAL LOW (ref 4.22–5.81)
RDW: 15 % (ref 11.5–15.5)
WBC: 6.2 K/uL (ref 4.0–10.5)
nRBC: 0 % (ref 0.0–0.2)

## 2024-05-02 LAB — BASIC METABOLIC PANEL WITH GFR
Anion gap: 12 (ref 5–15)
BUN: 33 mg/dL — ABNORMAL HIGH (ref 8–23)
CO2: 25 mmol/L (ref 22–32)
Calcium: 8.5 mg/dL — ABNORMAL LOW (ref 8.9–10.3)
Chloride: 106 mmol/L (ref 98–111)
Creatinine, Ser: 1.85 mg/dL — ABNORMAL HIGH (ref 0.61–1.24)
GFR, Estimated: 40 mL/min — ABNORMAL LOW (ref >60)
Glucose, Bld: 129 mg/dL — ABNORMAL HIGH (ref 70–99)
Potassium: 4.4 mmol/L (ref 3.5–5.1)
Sodium: 143 mmol/L (ref 135–145)

## 2024-05-02 LAB — GLUCOSE, CAPILLARY
Glucose-Capillary: 116 mg/dL — ABNORMAL HIGH (ref 70–99)
Glucose-Capillary: 124 mg/dL — ABNORMAL HIGH (ref 70–99)
Glucose-Capillary: 182 mg/dL — ABNORMAL HIGH (ref 70–99)

## 2024-05-02 LAB — MAGNESIUM: Magnesium: 2.4 mg/dL (ref 1.7–2.4)

## 2024-05-02 LAB — PHOSPHORUS: Phosphorus: 3.3 mg/dL (ref 2.5–4.6)

## 2024-05-02 MED ORDER — OXYCODONE-ACETAMINOPHEN 5-325 MG PO TABS: 2.0000 | ORAL_TABLET | ORAL | Status: DC | PRN

## 2024-05-02 MED ORDER — SORBITOL 70 % SOLN: 15.0000 mL | Freq: Once | Status: DC

## 2024-05-02 MED ORDER — SODIUM CHLORIDE 0.9 % IV SOLN: INTRAVENOUS | Status: DC

## 2024-05-02 MED ORDER — MORPHINE SULFATE (PF) 2 MG/ML IV SOLN: 4.0000 mg | INTRAVENOUS | Status: DC | PRN

## 2024-05-02 MED ORDER — BISOPROLOL FUMARATE 10 MG PO TABS
10.0000 mg | ORAL_TABLET | Freq: Every evening | ORAL | Status: DC
  Administered 2024-05-02: 10 mg
  Filled 2024-05-02: qty 1

## 2024-05-02 MED ORDER — POLYETHYLENE GLYCOL 3350 17 G PO PACK: 17.0000 g | PACK | Freq: Two times a day (BID) | ORAL | Status: DC

## 2024-05-02 MED ORDER — BISOPROLOL FUMARATE 10 MG PO TABS
10.0000 mg | ORAL_TABLET | Freq: Every evening | ORAL | Status: DC
  Administered 2024-05-03: 10 mg via ORAL
  Filled 2024-05-02: qty 1

## 2024-05-02 MED ORDER — PHENOL 1.4 % MT LIQD: 1.0000 | OROMUCOSAL | Status: DC | PRN

## 2024-05-02 NOTE — Plan of Care (Signed)

## 2024-05-02 NOTE — Progress Notes (Addendum)
 Subjective: Passing flatus, no BM yet.  Feels better.  Minimal to no NGT output currently  ROS: See above, otherwise other systems negative  Objective: Vital signs in last 24 hours: Temp:  [98.2 F (36.8 C)-98.3 F (36.8 C)] 98.3 F (36.8 C) (08/14 0801) Pulse Rate:  [72-94] 81 (08/14 0801) Resp:  [13-19] 18 (08/14 0801) BP: (133-148)/(59-99) 148/79 (08/14 0801) SpO2:  [89 %-94 %] 89 % (08/14 0801) Last BM Date : 04/25/24  Intake/Output from previous day: 08/13 0701 - 08/14 0700 In: 1501.9 [P.O.:10; I.V.:1391.9; IV Piggyback:100] Out: 1750 [Urine:1500; Emesis/NG output:250] Intake/Output this shift: No intake/output data recorded.  PE: Gen: NAD, sitting up in a chair Abd: soft, morbidly obese, ventral hernia is soft and mostly reducible, NGT with essentially no output currently.  Only 250cc documented yesterday.  Lab Results:  Recent Labs    05/01/24 0452 05/02/24 0406  WBC 5.5 6.2  HGB 11.1* 11.0*  HCT 37.1* 36.5*  PLT 174 178   BMET Recent Labs    05/01/24 0452 05/02/24 0406  NA 144 143  K 4.8 4.4  CL 105 106  CO2 29 25  GLUCOSE 133* 129*  BUN 37* 33*  CREATININE 2.09* 1.85*  CALCIUM  8.6* 8.5*   PT/INR No results for input(s): LABPROT, INR in the last 72 hours. CMP     Component Value Date/Time   NA 143 05/02/2024 0406   NA 140 06/19/2020 0000   K 4.4 05/02/2024 0406   CL 106 05/02/2024 0406   CO2 25 05/02/2024 0406   GLUCOSE 129 (H) 05/02/2024 0406   BUN 33 (H) 05/02/2024 0406   BUN 19 06/19/2020 0000   CREATININE 1.85 (H) 05/02/2024 0406   CREATININE 1.59 (H) 03/03/2014 0745   CALCIUM  8.5 (L) 05/02/2024 0406   PROT 7.6 04/29/2024 1249   ALBUMIN 3.4 (L) 04/29/2024 1249   AST 24 04/29/2024 1249   ALT 20 04/29/2024 1249   ALKPHOS 78 04/29/2024 1249   BILITOT 0.7 04/29/2024 1249   GFRNONAA 40 (L) 05/02/2024 0406   GFRAA 35 (L) 05/29/2020 0922   Lipase     Component Value Date/Time   LIPASE 74 (H) 04/29/2024 1249        Studies/Results: DG Abd Portable 1V Result Date: 04/30/2024 CLINICAL DATA:  NG tube placement EXAM: PORTABLE ABDOMEN - 1 VIEW COMPARISON:  11/12/2023, CT 04/29/2024 FINDINGS: Enteric tube tip overlies the stomach, possible kink at the distal tube. Mild air distension of the upper bowel. IMPRESSION: Enteric tube tip overlies the stomach, possible kink at the distal tube. Electronically Signed   By: Luke Bun M.D.   On: 04/30/2024 16:42    Anti-infectives: Anti-infectives (From admission, onward)    None        Assessment/Plan SBO -hernia appears chronic and if area of transition is not related to this.  -passing flatus, minimal NGT output, and feeling better -clamp NGT and start CLD, if tolerating well, may DC NGT later today -mobilize  -d/w primary service on the ward  FEN - NGT clamp, CLD VTE - Lovenox  ID - none needed  H/o colon cancer Anemia CHF DM HTN HLD Morbid obesity OSA  I reviewed hospitalist notes, last 24 h vitals and pain scores, last 48 h intake and output, last 24 h labs and trends, and last 24 h imaging results.   LOS: 3 days    Burnard FORBES Banter , [facility] 05/02/2024, 9:56 AM Please see Amion for pager number  during day hours 7:00am-4:30pm or 7:00am -11:30am on weekends

## 2024-05-02 NOTE — Progress Notes (Signed)
 [facility] PROGRESS NOTE    Progress Note  [name]  FMW:[id number] DOB: [birth year] DOA: 04/29/2024 PCP: Delores Rojelio Caldron, NP     Brief Narrative:   [name] is an 68 y.o. male past medical history of diabetes mellitus type 2, chronic diastolic dysfunction, diabetes mellitus type 2 history of colon cancer status post resection chronic constipation comes into the ED with vomiting that started 3 days prior to admission was found to have a small bowel obstruction General Surgery was consulted.  Assessment/Plan:   SBO (small bowel obstruction) [facility]) Surgery was consulted and recommended conservative management. NG tube was placed.  Yesterday in the afternoon with large amounts of fluids and symptoms have improved.  Negative about 1.3 L. Try to keep potassium greater than 4 magnesium greater than 2. Further management per surgery. Patient not able to ambulate.  Chronic diastolic dysfunction: Holding Jardiance  Lasix  ARB and Zaroxolyn  given n.p.o. status. Can restart bisoprolol .   Acute kidney injury on chronic kidney disease stage IIIb: With a baseline creatinine 1.6-2.2. Admission 2.6. Hemodynamically mediated, started on IV fluids creatinine this morning is 1.8 continue IV fluids recheck basic metabolic panel in the morning.  Diabetes mellitus type 2: Blood glucose well-controlled on sliding scale continue current regimen.  Essential hypertension: Holding all antihypertensive medication.  Hyperlipidemia: Currently NPO.  Morbid obesity: Noted.  Diabetic peripheral neuropathy (HCC) Noted.  DVT prophylaxis: lovenox  Family Communication:none Status is: Inpatient Remains inpatient appropriate because: Small bowel obstruction    Code Status:     Code Status Orders  (From admission, onward)           Start     Ordered   04/29/24 1711  Full code  Continuous       Question:  By:  Answer:  Consent: discussion documented in EHR   04/29/24 1712            Code Status History     Date Active Date Inactive Code Status Order ID Comments User Context   11/10/2023 1730 11/13/2023 1835 Full Code [id number]  Moody Alto, MD ED   11/25/2021 0012 12/01/2021 1546 DNR [id number]  Seena Marsa NOVAK, MD ED   11/24/2021 2350 11/25/2021 0012 Full Code [id number]  Seena Marsa NOVAK, MD ED   09/28/2021 2233 09/29/2021 1929 DNR [id number]  Alfornia Madison, MD ED   09/28/2021 2136 09/28/2021 2233 Full Code [id number]  Alfornia Madison, MD ED   12/18/2013 1754 12/24/2013 1649 Full Code [id number]  Ebbie Cough, MD Inpatient   12/17/2013 2359 12/18/2013 1754 Full Code [id number]  Tammy Sor, PA-C Inpatient      Advance Directive Documentation    Flowsheet Row Most Recent Value  Type of Advance Directive Healthcare Power of Attorney  Pre-existing out of facility DNR order (yellow form or pink MOST form) --  MOST Form in Place? --      IV Access:   Peripheral IV   Procedures and diagnostic studies:   DG Abd Portable 1V Result Date: 04/30/2024 CLINICAL DATA:  NG tube placement EXAM: PORTABLE ABDOMEN - 1 VIEW COMPARISON:  11/12/2023, CT 04/29/2024 FINDINGS: Enteric tube tip overlies the stomach, possible kink at the distal tube. Mild air distension of the upper bowel. IMPRESSION: Enteric tube tip overlies the stomach, possible kink at the distal tube. Electronically Signed   By: Luke Bun M.D.   On: 04/30/2024 16:42     Medical Consultants:   None.   Subjective:    [name] passing gas abdominal pain improved.  Objective:  Vitals:   05/01/24 2055 05/02/24 0409 05/02/24 0612 05/02/24 0801  BP: (!) 133/99  (!) 141/59 (!) 148/79  Pulse: 72  84 81  Resp: 13 19 18 18   Temp: 98.3 F (36.8 C)  98.2 F (36.8 C) 98.3 F (36.8 C)  TempSrc: Oral   Oral  SpO2: 91%  94% (!) 89%  Weight:      Height:       SpO2: (!) 89 % O2 Flow Rate (L/min): 2 L/min   Intake/Output Summary (Last 24 hours) at 05/02/2024 0917 Last data  filed at 05/02/2024 0540 Gross per 24 hour  Intake 1501.93 ml  Output 1750 ml  Net -248.07 ml   Filed Weights   04/29/24 1002  Weight: (!) 181 kg    Exam: General exam: In no acute distress. Respiratory system: Good air movement and clear to auscultation. Cardiovascular system: S1 & S2 heard, RRR. No JVD. Gastrointestinal system: Abdomen is nondistended, soft and nontender.  Extremities: No pedal edema. Skin: No rashes, lesions or ulcers Psychiatry: Judgement and insight appear normal. Mood & affect appropriate. Data Reviewed:    Labs: Basic Metabolic Panel: Recent Labs  Lab 04/29/24 1022 04/29/24 1249 04/30/24 0454 05/01/24 0452 05/02/24 0406  NA 133* 137 141 144 143  K 8.1* 5.1 4.8 4.8 4.4  CL 100 97* 100 105 106  CO2  --  29 31 29 25   GLUCOSE 239* 226* 145* 133* 129*  BUN 69* 45* 43* 37* 33*  CREATININE 2.60* 2.45* 2.39* 2.09* 1.85*  CALCIUM   --  8.9 8.4* 8.6* 8.5*  MG  --   --  2.2 2.2 2.4  PHOS  --   --  4.8* 4.1 3.3   GFR Estimated Creatinine Clearance: 66.8 mL/min (A) (by C-G formula based on SCr of 1.85 mg/dL (H)). Liver Function Tests: Recent Labs  Lab 04/29/24 1249  AST 24  ALT 20  ALKPHOS 78  BILITOT 0.7  PROT 7.6  ALBUMIN 3.4*   Recent Labs  Lab 04/29/24 1249  LIPASE 74*   No results for input(s): AMMONIA in the last 168 hours. Coagulation profile No results for input(s): INR, PROTIME in the last 168 hours. COVID-19 Labs  No results for input(s): DDIMER, FERRITIN, LDH, CRP in the last 72 hours.  Lab Results  Component Value Date   SARSCOV2NAA NEGATIVE 11/24/2021   SARSCOV2NAA NEGATIVE 09/28/2021    CBC: Recent Labs  Lab 04/29/24 1017 04/29/24 1022 04/30/24 0454 05/01/24 0452 05/02/24 0406  WBC 6.0  --  5.0 5.5 6.2  NEUTROABS 4.5  --   --   --   --   HGB 12.5* 13.9 11.6* 11.1* 11.0*  HCT 40.8 41.0 38.9* 37.1* 36.5*  MCV 89.5  --  91.1 91.8 91.3  PLT 220  --  196 174 178   Cardiac Enzymes: No results for  input(s): CKTOTAL, CKMB, CKMBINDEX, TROPONINI in the last 168 hours. BNP (last 3 results) No results for input(s): PROBNP in the last 8760 hours. CBG: Recent Labs  Lab 05/01/24 0748 05/01/24 1119 05/01/24 1634 05/01/24 2344 05/02/24 0610  GLUCAP 121* 117* 126* 133* 116*   D-Dimer: No results for input(s): DDIMER in the last 72 hours. Hgb A1c: No results for input(s): HGBA1C in the last 72 hours. Lipid Profile: No results for input(s): CHOL, HDL, LDLCALC, TRIG, CHOLHDL, LDLDIRECT in the last 72 hours. Thyroid  function studies: No results for input(s): TSH, T4TOTAL, T3FREE, THYROIDAB in the last 72 hours.  Invalid input(s): FREET3 Anemia work up:  No results for input(s): VITAMINB12, FOLATE, FERRITIN, TIBC, IRON, RETICCTPCT in the last 72 hours. Sepsis Labs: Recent Labs  Lab 04/29/24 1017 04/30/24 0454 05/01/24 0452 05/02/24 0406  WBC 6.0 5.0 5.5 6.2   Microbiology No results found for this or any previous visit (from the past 240 hours).   Medications:    diatrizoate  meglumine -sodium  90 mL Per NG tube Once   enoxaparin  (LOVENOX ) injection  90 mg Subcutaneous Q24H   insulin  aspart  0-9 Units Subcutaneous Q6H   midazolam   2 mg Intravenous Once   Continuous Infusions:  sodium chloride  75 mL/hr at 05/01/24 1048   acetaminophen  1,000 mg (05/01/24 1819)      LOS: 3 days   [name]  [facility]  05/02/2024, 9:17 AM

## 2024-05-02 NOTE — Care Management Important Message (Signed)
 Important Message  Patient Details  Name: [name] MRN: [id number] Date of Birth: 1956/04/08   Important Message Given:  Yes - Medicare IM     Claretta Deed 05/02/2024, 2:45 PM

## 2024-05-03 ENCOUNTER — Ambulatory Visit: Admitting: Podiatry

## 2024-05-03 DIAGNOSIS — K56609 Unspecified intestinal obstruction, unspecified as to partial versus complete obstruction: Secondary | ICD-10-CM | POA: Diagnosis not present

## 2024-05-03 LAB — BASIC METABOLIC PANEL WITH GFR
Anion gap: 8 (ref 5–15)
BUN: 23 mg/dL (ref 8–23)
CO2: 27 mmol/L (ref 22–32)
Calcium: 8.4 mg/dL — ABNORMAL LOW (ref 8.9–10.3)
Chloride: 108 mmol/L (ref 98–111)
Creatinine, Ser: 1.71 mg/dL — ABNORMAL HIGH (ref 0.61–1.24)
GFR, Estimated: 44 mL/min — ABNORMAL LOW (ref >60)
Glucose, Bld: 174 mg/dL — ABNORMAL HIGH (ref 70–99)
Potassium: 4.1 mmol/L (ref 3.5–5.1)
Sodium: 143 mmol/L (ref 135–145)

## 2024-05-03 LAB — CBC
HCT: 32.8 % — ABNORMAL LOW (ref 39.0–52.0)
Hemoglobin: 10 g/dL — ABNORMAL LOW (ref 13.0–17.0)
MCH: 27.7 pg (ref 26.0–34.0)
MCHC: 30.5 g/dL (ref 30.0–36.0)
MCV: 90.9 fL (ref 80.0–100.0)
Platelets: 162 K/uL (ref 150–400)
RBC: 3.61 MIL/uL — ABNORMAL LOW (ref 4.22–5.81)
RDW: 14.9 % (ref 11.5–15.5)
WBC: 6.3 K/uL (ref 4.0–10.5)
nRBC: 0 % (ref 0.0–0.2)

## 2024-05-03 LAB — GLUCOSE, CAPILLARY
Glucose-Capillary: 188 mg/dL — ABNORMAL HIGH (ref 70–99)
Glucose-Capillary: 183 mg/dL — ABNORMAL HIGH (ref 70–99)
Glucose-Capillary: 145 mg/dL — ABNORMAL HIGH (ref 70–99)
Glucose-Capillary: 204 mg/dL — ABNORMAL HIGH (ref 70–99)
Glucose-Capillary: 220 mg/dL — ABNORMAL HIGH (ref 70–99)
Glucose-Capillary: 177 mg/dL — ABNORMAL HIGH (ref 70–99)

## 2024-05-03 NOTE — Progress Notes (Signed)
       Subjective: Feels great today.  + BMx2.  Tolerating CLD and ready to eat.  No abdominal pain  ROS: See above, otherwise other systems negative  Objective: Vital signs in last 24 hours: Temp:  [98.1 F (36.7 C)-98.5 F (36.9 C)] 98.3 F (36.8 C) (08/15 0902) Pulse Rate:  [57-71] 71 (08/15 0902) Resp:  [18] 18 (08/15 0902) BP: (125-144)/(64-81) 134/81 (08/15 0902) SpO2:  [95 %-99 %] 96 % (08/15 0902) Last BM Date : 05/02/24  Intake/Output from previous day: 08/14 0701 - 08/15 0700 In: 1904.7 [P.O.:480; I.V.:1424.7] Out: 1550 [Urine:1550] Intake/Output this shift: No intake/output data recorded.  PE: Gen: NAD, sitting up in a chair Abd: soft, morbidly obese, ventral hernia is soft and mostly reducible, NT  Lab Results:  Recent Labs    05/02/24 0406 05/03/24 0626  WBC 6.2 6.3  HGB 11.0* 10.0*  HCT 36.5* 32.8*  PLT 178 162   BMET Recent Labs    05/02/24 0406 05/03/24 0626  NA 143 143  K 4.4 4.1  CL 106 108  CO2 25 27  GLUCOSE 129* 174*  BUN 33* 23  CREATININE 1.85* 1.71*  CALCIUM  8.5* 8.4*   PT/INR No results for input(s): LABPROT, INR in the last 72 hours. CMP     Component Value Date/Time   NA 143 05/03/2024 0626   NA 140 06/19/2020 0000   K 4.1 05/03/2024 0626   CL 108 05/03/2024 0626   CO2 27 05/03/2024 0626   GLUCOSE 174 (H) 05/03/2024 0626   BUN 23 05/03/2024 0626   BUN 19 06/19/2020 0000   CREATININE 1.71 (H) 05/03/2024 0626   CREATININE 1.59 (H) 03/03/2014 0745   CALCIUM  8.4 (L) 05/03/2024 0626   PROT 7.6 04/29/2024 1249   ALBUMIN 3.4 (L) 04/29/2024 1249   AST 24 04/29/2024 1249   ALT 20 04/29/2024 1249   ALKPHOS 78 04/29/2024 1249   BILITOT 0.7 04/29/2024 1249   GFRNONAA 44 (L) 05/03/2024 0626   GFRAA 35 (L) 05/29/2020 0922   Lipase     Component Value Date/Time   LIPASE 74 (H) 04/29/2024 1249       Studies/Results: No results found.   Anti-infectives: Anti-infectives (From admission, onward)    None         Assessment/Plan SBO -hernia appears chronic and it appears area of transition is not related to this.  -passing flatus and moving his bowels -adv to soft diet -ok to DC home from our standpoint if tolerating soft diet -d/w primary service via secure chat -will need to follow up with Dr. Ebbie to discuss hernia repair in the future  FEN - soft VTE - Lovenox  ID - none needed  H/o colon cancer Anemia CHF DM HTN HLD Morbid obesity OSA  I reviewed hospitalist notes, last 24 h vitals and pain scores, last 48 h intake and output, last 24 h labs and trends, and last 24 h imaging results.   LOS: 4 days    Burnard FORBES Banter , [facility] Surgery 05/03/2024, 10:50 AM Please see Amion for pager number during day hours 7:00am-4:30pm or 7:00am -11:30am on weekends

## 2024-05-03 NOTE — Plan of Care (Signed)
  Problem: Education: Goal: Ability to describe self-care measures that may prevent or decrease complications (Diabetes Survival Skills Education) will improve 05/03/2024 2343 by Flynn Paralee PARAS, RN Outcome: Progressing 05/03/2024 2343 by Flynn Paralee PARAS, RN Outcome: Progressing   Problem: Coping: Goal: Ability to adjust to condition or change in health will improve Outcome: Progressing   Problem: Nutritional: Goal: Maintenance of adequate nutrition will improve Outcome: Progressing

## 2024-05-03 NOTE — Plan of Care (Signed)

## 2024-05-03 NOTE — Plan of Care (Signed)
   Problem: Education: Goal: Ability to describe self-care measures that may prevent or decrease complications (Diabetes Survival Skills Education) will improve Outcome: Progressing   Problem: Nutritional: Goal: Maintenance of adequate nutrition will improve Outcome: Progressing

## 2024-05-03 NOTE — Progress Notes (Signed)
 [facility] PROGRESS NOTE    Progress Note  [name]  FMW:[id number] DOB: Jan 20, 1956 DOA: 04/29/2024 PCP: Delores Rojelio Caldron, NP     Brief Narrative:   [name] is an 68 y.o. male past medical history of diabetes mellitus type 2, chronic diastolic dysfunction, diabetes mellitus type 2 history of colon cancer status post resection chronic constipation comes into the ED with vomiting that started 3 days prior to admission was found to have a small bowel obstruction General Surgery was consulted.  Assessment/Plan:   SBO (small bowel obstruction) [facility]) Surgery was consulted and recommended conservative management. NG tube was clamped, currently on clear liquid diet. Try to keep potassium greater than 4 magnesium greater than 2. Further management per surgery. Patient not able to ambulate.  Chronic diastolic dysfunction: Holding Jardiance  Lasix  ARB and Zaroxolyn  given n.p.o. status. Continue bisoprolol .   Acute kidney injury on chronic kidney disease stage IIIb: With a baseline creatinine 1.6-2.2. Admission 2.6. This creatinine has returned to baseline, KVO IV fluids.  Diabetes mellitus type 2: Blood glucose well-controlled on sliding scale continue current regimen.  Essential hypertension: Holding all antihypertensive medication. Blood pressure is stable.  Hyperlipidemia: Currently NPO.  Morbid obesity: Noted.  Diabetic peripheral neuropathy (HCC) Noted.  DVT prophylaxis: lovenox  Family Communication:none Status is: Inpatient Remains inpatient appropriate because: Small bowel obstruction    Code Status:     Code Status Orders  (From admission, onward)           Start     Ordered   04/29/24 1711  Full code  Continuous       Question:  By:  Answer:  Consent: discussion documented in EHR   04/29/24 1712           Code Status History     Date Active Date Inactive Code Status Order ID Comments User Context   11/10/2023 1730 11/13/2023  1835 Full Code [id number]  Moody Alto, MD ED   11/25/2021 0012 12/01/2021 1546 DNR [id number]  Seena Marsa NOVAK, MD ED   11/24/2021 2350 11/25/2021 0012 Full Code [id number]  Seena Marsa NOVAK, MD ED   09/28/2021 2233 09/29/2021 1929 DNR [id number]  Alfornia Madison, MD ED   09/28/2021 2136 09/28/2021 2233 Full Code [id number]  Alfornia Madison, MD ED   12/18/2013 1754 12/24/2013 1649 Full Code [id number]  Ebbie Cough, MD Inpatient   12/17/2013 2359 12/18/2013 1754 Full Code [id number]  Tammy Sor, PA-C Inpatient      Advance Directive Documentation    Flowsheet Row Most Recent Value  Type of Advance Directive Healthcare Power of Attorney  Pre-existing out of facility DNR order (yellow form or pink MOST form) --  MOST Form in Place? --      IV Access:   Peripheral IV   Procedures and diagnostic studies:   No results found.    Medical Consultants:   None.   Subjective:    [name] had a bowel movement tolerating his diet.  Objective:    Vitals:   05/02/24 0801 05/02/24 1705 05/02/24 2036 05/03/24 0533  BP: (!) 148/79 (!) 144/72 125/64 132/65  Pulse: 81 71 66 (!) 57  Resp: 18     Temp: 98.3 F (36.8 C) 98.1 F (36.7 C) 98.5 F (36.9 C) 98.3 F (36.8 C)  TempSrc: Oral Oral  Oral  SpO2: (!) 89% 99% 95% 95%  Weight:      Height:       SpO2: 95 % O2 Flow Rate (L/min): 2 L/min  Intake/Output Summary (Last 24 hours) at 05/03/2024 0830 Last data filed at 05/03/2024 0600 Gross per 24 hour  Intake 1904.74 ml  Output 1550 ml  Net 354.74 ml   Filed Weights   04/29/24 1002  Weight: (!) 181 kg    Exam: General exam: In no acute distress. Respiratory system: Good air movement and clear to auscultation. Cardiovascular system: S1 & S2 heard, RRR. No JVD. Gastrointestinal system: Abdomen is nondistended, soft and nontender.  Extremities: No pedal edema. Skin: No rashes, lesions or ulcers Psychiatry: Judgement and insight appear normal. Mood & affect  appropriate. Data Reviewed:    Labs: Basic Metabolic Panel: Recent Labs  Lab 04/29/24 1249 04/30/24 0454 05/01/24 0452 05/02/24 0406 05/03/24 0626  NA 137 141 144 143 143  K 5.1 4.8 4.8 4.4 4.1  CL 97* 100 105 106 108  CO2 29 31 29 25 27   GLUCOSE 226* 145* 133* 129* 174*  BUN 45* 43* 37* 33* 23  CREATININE 2.45* 2.39* 2.09* 1.85* 1.71*  CALCIUM  8.9 8.4* 8.6* 8.5* 8.4*  MG  --  2.2 2.2 2.4  --   PHOS  --  4.8* 4.1 3.3  --    GFR Estimated Creatinine Clearance: 72.3 mL/min (A) (by C-G formula based on SCr of 1.71 mg/dL (H)). Liver Function Tests: Recent Labs  Lab 04/29/24 1249  AST 24  ALT 20  ALKPHOS 78  BILITOT 0.7  PROT 7.6  ALBUMIN 3.4*   Recent Labs  Lab 04/29/24 1249  LIPASE 74*   No results for input(s): AMMONIA in the last 168 hours. Coagulation profile No results for input(s): INR, PROTIME in the last 168 hours. COVID-19 Labs  No results for input(s): DDIMER, FERRITIN, LDH, CRP in the last 72 hours.  Lab Results  Component Value Date   SARSCOV2NAA NEGATIVE 11/24/2021   SARSCOV2NAA NEGATIVE 09/28/2021    CBC: Recent Labs  Lab 04/29/24 1017 04/29/24 1022 04/30/24 0454 05/01/24 0452 05/02/24 0406 05/03/24 0626  WBC 6.0  --  5.0 5.5 6.2 6.3  NEUTROABS 4.5  --   --   --   --   --   HGB 12.5* 13.9 11.6* 11.1* 11.0* 10.0*  HCT 40.8 41.0 38.9* 37.1* 36.5* 32.8*  MCV 89.5  --  91.1 91.8 91.3 90.9  PLT 220  --  196 174 178 162   Cardiac Enzymes: No results for input(s): CKTOTAL, CKMB, CKMBINDEX, TROPONINI in the last 168 hours. BNP (last 3 results) No results for input(s): PROBNP in the last 8760 hours. CBG: Recent Labs  Lab 05/02/24 1118 05/02/24 1704 05/03/24 0101 05/03/24 0536 05/03/24 0726  GLUCAP 124* 182* 188* 183* 145*   D-Dimer: No results for input(s): DDIMER in the last 72 hours. Hgb A1c: No results for input(s): HGBA1C in the last 72 hours. Lipid Profile: No results for input(s): CHOL,  HDL, LDLCALC, TRIG, CHOLHDL, LDLDIRECT in the last 72 hours. Thyroid  function studies: No results for input(s): TSH, T4TOTAL, T3FREE, THYROIDAB in the last 72 hours.  Invalid input(s): FREET3 Anemia work up: No results for input(s): VITAMINB12, FOLATE, FERRITIN, TIBC, IRON, RETICCTPCT in the last 72 hours. Sepsis Labs: Recent Labs  Lab 04/30/24 0454 05/01/24 0452 05/02/24 0406 05/03/24 0626  WBC 5.0 5.5 6.2 6.3   Microbiology No results found for this or any previous visit (from the past 240 hours).   Medications:    bisoprolol   10 mg Oral QPM   diatrizoate  meglumine -sodium  90 mL Per NG tube Once   enoxaparin  (LOVENOX ) injection  90 mg Subcutaneous Q24H   insulin  aspart  0-9 Units Subcutaneous Q6H   midazolam   2 mg Intravenous Once   Continuous Infusions:  sodium chloride  75 mL/hr at 05/03/24 0301      LOS: 4 days   [name]  [facility]  05/03/2024, 8:30 AM

## 2024-05-04 DIAGNOSIS — K56609 Unspecified intestinal obstruction, unspecified as to partial versus complete obstruction: Secondary | ICD-10-CM | POA: Diagnosis not present

## 2024-05-04 LAB — BASIC METABOLIC PANEL WITH GFR
Anion gap: 7 (ref 5–15)
BUN: 17 mg/dL (ref 8–23)
CO2: 25 mmol/L (ref 22–32)
Calcium: 8.3 mg/dL — ABNORMAL LOW (ref 8.9–10.3)
Chloride: 106 mmol/L (ref 98–111)
Creatinine, Ser: 1.6 mg/dL — ABNORMAL HIGH (ref 0.61–1.24)
GFR, Estimated: 47 mL/min — ABNORMAL LOW (ref >60)
Glucose, Bld: 163 mg/dL — ABNORMAL HIGH (ref 70–99)
Potassium: 4 mmol/L (ref 3.5–5.1)
Sodium: 138 mmol/L (ref 135–145)

## 2024-05-04 LAB — CBC
HCT: 32.4 % — ABNORMAL LOW (ref 39.0–52.0)
Hemoglobin: 9.9 g/dL — ABNORMAL LOW (ref 13.0–17.0)
MCH: 27.4 pg (ref 26.0–34.0)
MCHC: 30.6 g/dL (ref 30.0–36.0)
MCV: 89.8 fL (ref 80.0–100.0)
Platelets: 157 K/uL (ref 150–400)
RBC: 3.61 MIL/uL — ABNORMAL LOW (ref 4.22–5.81)
RDW: 14.7 % (ref 11.5–15.5)
WBC: 5.5 K/uL (ref 4.0–10.5)
nRBC: 0 % (ref 0.0–0.2)

## 2024-05-04 LAB — GLUCOSE, CAPILLARY: Glucose-Capillary: 181 mg/dL — ABNORMAL HIGH (ref 70–99)

## 2024-05-04 MED ORDER — FUROSEMIDE 20 MG PO TABS: 60.0000 mg | ORAL_TABLET | Freq: Two times a day (BID) | ORAL | Status: AC

## 2024-05-04 NOTE — Discharge Summary (Signed)
 Physician Discharge Summary  [name] FMW:[id number] DOB: 09/18/1956 DOA: 04/29/2024  PCP: Delores Rojelio Caldron, NP  Admit date: 04/29/2024 Discharge date: 05/04/2024  Admitted From: Home Disposition:  Home  Recommendations for Outpatient Follow-up:  Follow up with PCP in 1-2 weeks   Home Health:No Equipment/Devices:None  Discharge Condition:Stable CODE STATUS:Full Diet recommendation: Heart Healthy  Brief/Interim Summary: 68 y.o. male past medical history of diabetes mellitus type 2, chronic diastolic dysfunction, diabetes mellitus type 2 history of colon cancer status post resection chronic constipation comes into the ED with vomiting that started 3 days prior to admission was found to have a small bowel obstruction General Surgery was consulted.   Discharge Diagnoses:  Principal Problem:   SBO (small bowel obstruction) (HCC) Active Problems:   Diabetic peripheral neuropathy (HCC)   Essential hypertension   Chronic diastolic CHF (congestive heart failure) (HCC)   Colon cancer (HCC)   Class 3 obesity with alveolar hypoventilation, serious comorbidity, and body mass index (BMI) of 50.0 to 59.9 in adult [facility])   Uncontrolled type 2 diabetes mellitus with hyperglycemia, without long-term current use of insulin  (HCC)   Chronic idiopathic constipation  Small bowel obstruction: NG tube was placed. Placed n.p.o. General Surgery was consulted recommended conservative management. Eventually small bowel obstruction resolved. He was able to tolerate his diet.  Chronic diastolic dysfunction: No change made to his medication continue current home regimen.  Acute kidney injury on chronic kidney disease stage IIIb: Likely hemodynamically mediated in the setting of nausea and vomiting on admission creatinine 2.6 started on IV fluids his creatinine returned to baseline.  Diabetes mellitus type 2: Blood glucose well-controlled no change made to his medication.  Essential  hypertension: Resume home regimen as an outpatient.  Hyperlipidemia: Resume statin as an outpatient.  Morbid obesity: Noted.  Diabetes peripheral neuropathy: Noted.  Discharge Instructions  Discharge Instructions     Diet - low sodium heart healthy   Complete by: As directed    Increase activity slowly   Complete by: As directed       Allergies as of 05/04/2024       Reactions   Shellfish Allergy Anaphylaxis   Lisinopril  Cough   Relistor  [methylnaltrexone  Bromide] Other (See Comments)   confusion   Lovastatin Itching        Medication List     TAKE these medications    Accu-Chek Aviva Connect w/Device Kit 1 Act by Does not apply route 3 (three) times daily as needed.   Accu-Chek Aviva Plus test strip Generic drug: glucose blood Use to test blood sugar twice daily. DX E11.8   Accu-Chek Softclix Lancets lancets CHECK BLOOD SUGAR 2 TIMES  DAILY   allopurinol  100 MG tablet Commonly known as: ZYLOPRIM  Take 1 tablet (100 mg total) by mouth daily.   aspirin  EC 81 MG tablet Take 1 tablet (81 mg total) by mouth daily.   atorvastatin  80 MG tablet Commonly known as: LIPITOR Take 1 tablet (80 mg total) by mouth daily.   bisoprolol  10 MG tablet Commonly known as: ZEBETA  Take 1 tablet (10 mg total) by mouth every evening.   blood glucose meter kit and supplies Dispense based on patient and insurance preference. Use up to four times daily as directed. (FOR ICD-10 E10.9, E11.9).   blood glucose meter kit and supplies Kit Inject 1 each into the skin 4 (four) times daily. Use to check blood sugar three times daily. Dispense Freestyle Libre according to insurance preference. DX: E11.8   colchicine  0.6 MG tablet  Take 0.6 mg by mouth daily.   DULCOLAX PO Take 1 tablet by mouth daily.   empagliflozin  10 MG Tabs tablet Commonly known as: Jardiance  Take 1 tablet (10 mg total) by mouth daily before breakfast.   ferrous sulfate  325 (65 FE) MG tablet Take 1  tablet (325 mg total) by mouth 2 (two) times daily with a meal.   FreeStyle Libre 2 Sensor Misc 1 Act by Does not apply route daily.   furosemide  20 MG tablet Commonly known as: LASIX  Take 3 tablets (60 mg total) by mouth 2 (two) times daily. Start taking on: May 06, 2024 What changed: These instructions start on May 06, 2024. If you are unsure what to do until then, ask your doctor or other care provider.   gabapentin  300 MG capsule Commonly known as: NEURONTIN  Take 300 mg by mouth daily as needed (pain).   HYDROcodone -acetaminophen  10-325 MG tablet Commonly known as: NORCO Take 1 tablet by mouth every 6 (six) hours as needed for severe pain (pain score 7-10).   Lantus SoloStar 100 UNIT/ML Solostar Pen Generic drug: insulin  glargine Inject 65 Units into the skin daily.   losartan  25 MG tablet Commonly known as: COZAAR  Take 25 mg by mouth daily.   metolazone  2.5 MG tablet Commonly known as: ZAROXOLYN  TAKE 1 TABLET (2.5 MG TOTAL) BY MOUTH ONCE A WEEK. EVERY TUESDAY   MULTIVITAMIN ADULT PO Take 1 tablet by mouth daily.   potassium chloride  SA 20 MEQ tablet Commonly known as: KLOR-CON  M TAKE 1 TABLET BY MOUTH ONCE A WEEK. EVERY TUESDAY   ProAir  RespiClick 108 (90 Base) MCG/ACT Aepb Generic drug: Albuterol  Sulfate Inhale 1 puff into the lungs 4 (four) times daily as needed. What changed: reasons to take this   Restasis  0.05 % ophthalmic emulsion Generic drug: cycloSPORINE  PLACE 1 DROP INTO BOTH EYES TWICE A DAY   UltiCare Short Pen Needles 31G X 8 MM Misc Generic drug: Insulin  Pen Needle USE TWO PEN NEEDLES DAILY WITH INSULIN  PENS   VITAMIN D  PO Take 1 tablet by mouth daily.   VITAMIN E PO Take 1 tablet by mouth daily.        Follow-up Information     Ebbie Cough, MD Follow up in 1 month(s).   Specialty: General Surgery Why: to discuss hernia repair Contact information: [address] [place] [address] [phone]                 Allergies  Allergen Reactions   Shellfish Allergy Anaphylaxis   Lisinopril  Cough   Relistor  [Methylnaltrexone  Bromide] Other (See Comments)    confusion   Lovastatin Itching    Consultations: Surgery   Procedures/Studies: DG Abd Portable 1V Result Date: 04/30/2024 CLINICAL DATA:  NG tube placement EXAM: PORTABLE ABDOMEN - 1 VIEW COMPARISON:  11/12/2023, CT 04/29/2024 FINDINGS: Enteric tube tip overlies the stomach, possible kink at the distal tube. Mild air distension of the upper bowel. IMPRESSION: Enteric tube tip overlies the stomach, possible kink at the distal tube. Electronically Signed   By: Luke Bun M.D.   On: 04/30/2024 16:42   CT ABDOMEN PELVIS WO CONTRAST Result Date: 04/29/2024 CLINICAL DATA:  Abdominal pain, acute, nonlocalized Bowel obstruction suspected EXAM: CT ABDOMEN AND PELVIS WITHOUT CONTRAST TECHNIQUE: Multidetector CT imaging of the abdomen and pelvis was performed following the standard protocol without IV contrast. RADIATION DOSE REDUCTION: This exam was performed according to the departmental dose-optimization program which includes automated exposure control, adjustment of the mA and/or  kV according to patient size and/or use of iterative reconstruction technique. COMPARISON:  Abdominopelvic CT 11/10/2023 and 12/01/2022. FINDINGS: Lower chest: Mild atelectasis at both lung bases. No confluent airspace disease, pleural or pericardial effusion. Aortic and coronary artery atherosclerosis noted. Hepatobiliary: No focal hepatic abnormalities are identified on noncontrast imaging. A calcified gallstone is again noted. No evidence of gallbladder wall thickening, surrounding inflammation or biliary ductal dilatation. Pancreas: Generalized atrophy. No ductal dilatation or surrounding inflammation. Spleen: Normal in size without focal abnormality. Adrenals/Urinary Tract: Both adrenal glands appear normal. Bilateral renal cortical thinning. No evidence of  urinary tract calculus, hydronephrosis or perinephric soft tissue stranding. The bladder appears unremarkable for its degree of distention. Stomach/Bowel: No enteric contrast administered. Laparoscopic gastric band remains in place. There is increased distention of a fluid-filled stomach and mildly increased dilatation of fluid-filled proximal to mid small bowel. Again demonstrated is abrupt transition in small bowel caliber deep to the midline incision, suspicious for recurrent small bowel obstruction secondary to adhesions. The distal small bowel is decompressed. The appendix is not as well seen currently, although demonstrates no acute abnormality. The colon is decompressed and contains a moderate amount of stool. There is a stable supraumbilical hernia containing a portion of the transverse colon. Vascular/Lymphatic: There are no enlarged abdominal or pelvic lymph nodes. Aortic and branch vessel atherosclerosis. No evidence of aneurysm. Reproductive: The prostate gland and seminal vesicles appear unremarkable. Other: As above, postsurgical changes in the anterior abdominal wall with a stable supraumbilical hernia containing a portion of the transverse colon. No ascites, focal extraluminal fluid collection or pneumoperitoneum. Musculoskeletal: No acute or significant osseous findings. Postsurgical changes from previous bilateral femoral neck pinning. Chronic collapse of the right femoral head with severe asymmetric right hip degenerative changes. Mild multilevel spondylosis. IMPRESSION: 1. Findings are consistent with recurrent mid small bowel obstruction with transition point deep to the midline incision, likely secondary to adhesions. 2. No evidence of bowel perforation or abscess. 3. Stable supraumbilical hernia containing a portion of the transverse colon. 4. Cholelithiasis without evidence of cholecystitis or biliary ductal dilatation. 5.  Aortic Atherosclerosis (ICD10-I70.0). Electronically Signed   By:  Elsie Perone M.D.   On: 04/29/2024 14:47   DG Chest Portable 1 View Result Date: 04/29/2024 EXAM: 1 VIEW XRAY OF THE CHEST 04/29/2024 10:31:31 AM COMPARISON: 11/10/2023 CLINICAL HISTORY: Chest pain. Reason for exam: chest pain and SOB FINDINGS: LUNGS AND PLEURA: No focal pulmonary opacity. No pulmonary edema. No pleural effusion. No pneumothorax. HEART AND MEDIASTINUM: No acute abnormality of the cardiac and mediastinal silhouettes. BONES AND SOFT TISSUES: No acute osseous abnormality. IMPRESSION: 1. No acute cardiopulmonary pathology. Electronically signed by: evalene coho 04/29/2024 11:16 AM EDT RP Workstation: [id number]   (Echo, Carotid, EGD, Colonoscopy, ERCP)    Subjective: No complaints  Discharge Exam: Vitals:   05/03/24 1943 05/04/24 0502  BP: (!) 144/87 123/67  Pulse: 65 70  Resp: 18 18  Temp: 98.2 F (36.8 C) 97.9 F (36.6 C)  SpO2: 96% 92%   Vitals:   05/03/24 0902 05/03/24 1648 05/03/24 1943 05/04/24 0502  BP: 134/81 (!) 149/81 (!) 144/87 123/67  Pulse: 71 67 65 70  Resp: 18 18 18 18   Temp: 98.3 F (36.8 C) 98.5 F (36.9 C) 98.2 F (36.8 C) 97.9 F (36.6 C)  TempSrc:      SpO2: 96% 99% 96% 92%  Weight:      Height:        General: Pt is alert, awake, not in acute distress  Cardiovascular: RRR, S1/S2 +, no rubs, no gallops Respiratory: CTA bilaterally, no wheezing, no rhonchi Abdominal: Soft, NT, ND, bowel sounds + Extremities: no edema, no cyanosis    The results of significant diagnostics from this hospitalization (including imaging, microbiology, ancillary and laboratory) are listed below for reference.     Microbiology: No results found for this or any previous visit (from the past 240 hours).   Labs: BNP (last 3 results) Recent Labs    07/11/23 0938 10/13/23 0947  BNP 93.3 32.9   Basic Metabolic Panel: Recent Labs  Lab 04/30/24 0454 05/01/24 0452 05/02/24 0406 05/03/24 0626 05/04/24 0620  NA 141 144 143 143 138  K 4.8 4.8 4.4  4.1 4.0  CL 100 105 106 108 106  CO2 31 29 25 27 25   GLUCOSE 145* 133* 129* 174* 163*  BUN 43* 37* 33* 23 17  CREATININE 2.39* 2.09* 1.85* 1.71* 1.60*  CALCIUM  8.4* 8.6* 8.5* 8.4* 8.3*  MG 2.2 2.2 2.4  --   --   PHOS 4.8* 4.1 3.3  --   --    Liver Function Tests: Recent Labs  Lab 04/29/24 1249  AST 24  ALT 20  ALKPHOS 78  BILITOT 0.7  PROT 7.6  ALBUMIN 3.4*   Recent Labs  Lab 04/29/24 1249  LIPASE 74*   No results for input(s): AMMONIA in the last 168 hours. CBC: Recent Labs  Lab 04/29/24 1017 04/29/24 1022 04/30/24 0454 05/01/24 0452 05/02/24 0406 05/03/24 0626 05/04/24 0620  WBC 6.0  --  5.0 5.5 6.2 6.3 5.5  NEUTROABS 4.5  --   --   --   --   --   --   HGB 12.5*   < > 11.6* 11.1* 11.0* 10.0* 9.9*  HCT 40.8   < > 38.9* 37.1* 36.5* 32.8* 32.4*  MCV 89.5  --  91.1 91.8 91.3 90.9 89.8  PLT 220  --  196 174 178 162 157   < > = values in this interval not displayed.   Cardiac Enzymes: No results for input(s): CKTOTAL, CKMB, CKMBINDEX, TROPONINI in the last 168 hours. BNP: Invalid input(s): POCBNP CBG: Recent Labs  Lab 05/03/24 0726 05/03/24 1133 05/03/24 1646 05/03/24 2314 05/04/24 0615  GLUCAP 145* 204* 220* 177* 181*   D-Dimer No results for input(s): DDIMER in the last 72 hours. Hgb A1c No results for input(s): HGBA1C in the last 72 hours. Lipid Profile No results for input(s): CHOL, HDL, LDLCALC, TRIG, CHOLHDL, LDLDIRECT in the last 72 hours. Thyroid  function studies No results for input(s): TSH, T4TOTAL, T3FREE, THYROIDAB in the last 72 hours.  Invalid input(s): FREET3 Anemia work up No results for input(s): VITAMINB12, FOLATE, FERRITIN, TIBC, IRON, RETICCTPCT in the last 72 hours. Urinalysis    Component Value Date/Time   COLORURINE STRAW (A) 11/10/2023 2200   APPEARANCEUR CLEAR 11/10/2023 2200   LABSPEC 1.014 11/10/2023 2200   PHURINE 5.0 11/10/2023 2200   GLUCOSEU 150 (A) 11/10/2023  2200   GLUCOSEU >=1000 (A) 12/08/2020 0918   HGBUR NEGATIVE 11/10/2023 2200   BILIRUBINUR NEGATIVE 11/10/2023 2200   KETONESUR NEGATIVE 11/10/2023 2200   PROTEINUR NEGATIVE 11/10/2023 2200   UROBILINOGEN 0.2 12/08/2020 0918   NITRITE NEGATIVE 11/10/2023 2200   LEUKOCYTESUR NEGATIVE 11/10/2023 2200   Sepsis Labs Recent Labs  Lab 05/01/24 0452 05/02/24 0406 05/03/24 0626 05/04/24 0620  WBC 5.5 6.2 6.3 5.5   Microbiology No results found for this or any previous visit (from the past 240 hours).   Time  coordinating discharge: Over 35 minutes  SIGNED:   Erle Odell Castor, MD  [facility] 05/04/2024, 8:14 AM Pager   If 7PM-7AM, please contact night-coverage [email or web address] Password [email or web address]

## 2024-09-25 ENCOUNTER — Telehealth (HOSPITAL_COMMUNITY): Payer: Self-pay | Admitting: Cardiology

## 2024-09-25 NOTE — Telephone Encounter (Unsigned)
 Patient has left multiple messages regarding a critical illness for [employer]  -forms are not in the office, attempted to contact patient to notify pt and verify fax #  -attempted to contact patient x 3 unsuccessful  NO VOICEMAIL

## 2024-10-10 ENCOUNTER — Telehealth: Payer: Self-pay | Admitting: Nurse Practitioner

## 2024-10-10 NOTE — Telephone Encounter (Signed)
 Not our pt, I sent an error's report for e2c2 to do.

## 2024-10-10 NOTE — Telephone Encounter (Signed)
 Copied from CRM #[id number]. Topic: General - Other >> Oct 10, 2024  9:40 AM Alfonso HERO wrote: Reason for CRM: [employer] calling to see if lab orders they faxed on 1/20 was rcvd.

## 2024-10-11 ENCOUNTER — Ambulatory Visit: Admitting: Podiatry

## 2024-10-11 DIAGNOSIS — B351 Tinea unguium: Secondary | ICD-10-CM | POA: Diagnosis not present

## 2024-10-11 DIAGNOSIS — M79675 Pain in left toe(s): Secondary | ICD-10-CM | POA: Diagnosis not present

## 2024-10-11 DIAGNOSIS — M79674 Pain in right toe(s): Secondary | ICD-10-CM

## 2024-10-11 NOTE — Progress Notes (Signed)
"  °  Subjective:  Patient ID: [name], male    DOB: Mar 02, 1956,  MRN: [id number]  Chief Complaint  Patient presents with   Nail Problem   69 y.o. male returns for the above complaint.  Patient presents with thickened onychodystrophy mycotic toenails x 10 with no progression he would like for me debride and is not able to himself denies any other acute complaints.  Objective:  There were no vitals filed for this visit. Podiatric Exam: Vascular: dorsalis pedis and posterior tibial pulses are palpable bilateral. Capillary return is immediate. Temperature gradient is WNL. Skin turgor WNL  Sensorium: Normal Semmes Weinstein monofilament test. Normal tactile sensation bilaterally. Nail Exam: Pt has thick disfigured discolored nails with subungual debris noted bilateral entire nail hallux through fifth toenails.  Pain on palpation to the nails. Ulcer Exam: There is no evidence of ulcer or pre-ulcerative changes or infection. Orthopedic Exam: Muscle tone and strength are WNL. No limitations in general ROM. No crepitus or effusions noted.  Skin: No Porokeratosis. No infection or ulcers    Assessment & Plan:   1. Pain due to onychomycosis of toenails of both feet      Patient was evaluated and treated and all questions answered.  Onychomycosis with pain  -Nails palliatively debrided as below. -Educated on self-care  Procedure: Nail Debridement Rationale: pain  Type of Debridement: manual, sharp debridement. Instrumentation: Nail nipper, rotary burr. Number of Nails: 10  Procedures and Treatment: Consent by patient was obtained for treatment procedures. The patient understood the discussion of treatment and procedures well. All questions were answered thoroughly reviewed. Debridement of mycotic and hypertrophic toenails, 1 through 5 bilateral and clearing of subungual debris. No ulceration, no infection noted.  Return Visit-Office Procedure: Patient instructed to return to the office  for a follow up visit 3 months for continued evaluation and treatment.  Franky Blanch, DPM    No follow-ups on file.  "

## 2024-10-30 ENCOUNTER — Encounter: Admitting: Family Medicine

## 2024-10-31 ENCOUNTER — Ambulatory Visit (HOSPITAL_COMMUNITY): Admitting: Cardiology

## 2024-11-13 ENCOUNTER — Ambulatory Visit: Admitting: Pulmonary Disease

## 2024-11-21 ENCOUNTER — Ambulatory Visit: Admitting: Family Medicine
